# Patient Record
Sex: Female | Born: 1935 | Race: Black or African American | Hispanic: No | Smoking: Never smoker
Health system: Southern US, Community
[De-identification: ages and names within clinical notes are randomized; demographics above are authoritative.]

## PROBLEM LIST (undated history)

## (undated) DIAGNOSIS — I82409 Acute embolism and thrombosis of unspecified deep veins of unspecified lower extremity: Secondary | ICD-10-CM

## (undated) DIAGNOSIS — I639 Cerebral infarction, unspecified: Secondary | ICD-10-CM

## (undated) DIAGNOSIS — E119 Type 2 diabetes mellitus without complications: Secondary | ICD-10-CM

## (undated) DIAGNOSIS — F0390 Unspecified dementia without behavioral disturbance: Secondary | ICD-10-CM

## (undated) DIAGNOSIS — I10 Essential (primary) hypertension: Secondary | ICD-10-CM

## (undated) DIAGNOSIS — I890 Lymphedema, not elsewhere classified: Secondary | ICD-10-CM

## (undated) DIAGNOSIS — I2699 Other pulmonary embolism without acute cor pulmonale: Secondary | ICD-10-CM

## (undated) DIAGNOSIS — E785 Hyperlipidemia, unspecified: Secondary | ICD-10-CM

## (undated) HISTORY — DX: Other pulmonary embolism without acute cor pulmonale: I26.99

## (undated) HISTORY — DX: Essential (primary) hypertension: I10

## (undated) HISTORY — PX: VASCULAR SURGERY: SHX849

## (undated) HISTORY — DX: Acute embolism and thrombosis of unspecified deep veins of unspecified lower extremity: I82.409

## (undated) HISTORY — DX: Lymphedema, not elsewhere classified: I89.0

## (undated) HISTORY — PX: VAGINAL HYSTERECTOMY: SUR661

## (undated) HISTORY — DX: Type 2 diabetes mellitus without complications: E11.9

## (undated) HISTORY — DX: Hyperlipidemia, unspecified: E78.5

---

## 2010-12-28 ENCOUNTER — Ambulatory Visit
Admission: RE | Admit: 2010-12-28 | Discharge: 2010-12-28 | Disposition: A | Discharge status: Home / Self Care | Payer: Medicare Other | Source: Ambulatory Visit | Attending: Internal Medicine | Admitting: Internal Medicine

## 2010-12-28 ENCOUNTER — Other Ambulatory Visit: Payer: Self-pay | Admitting: Internal Medicine

## 2010-12-28 DIAGNOSIS — R609 Edema, unspecified: Secondary | ICD-10-CM

## 2011-02-15 ENCOUNTER — Ambulatory Visit: Payer: Medicare Other | Admitting: Hematology & Oncology

## 2011-02-28 ENCOUNTER — Encounter: Payer: Self-pay | Admitting: *Deleted

## 2011-03-21 ENCOUNTER — Other Ambulatory Visit: Payer: Medicare Other | Admitting: Lab

## 2011-03-21 ENCOUNTER — Encounter: Payer: Self-pay | Admitting: Hematology & Oncology

## 2011-03-21 ENCOUNTER — Ambulatory Visit: Payer: Medicare Other

## 2011-03-21 ENCOUNTER — Ambulatory Visit (HOSPITAL_BASED_OUTPATIENT_CLINIC_OR_DEPARTMENT_OTHER): Payer: Medicare Other | Admitting: Hematology & Oncology

## 2011-03-21 DIAGNOSIS — E785 Hyperlipidemia, unspecified: Secondary | ICD-10-CM

## 2011-03-21 DIAGNOSIS — I890 Lymphedema, not elsewhere classified: Secondary | ICD-10-CM

## 2011-03-21 DIAGNOSIS — Z86711 Personal history of pulmonary embolism: Secondary | ICD-10-CM | POA: Insufficient documentation

## 2011-03-21 DIAGNOSIS — E119 Type 2 diabetes mellitus without complications: Secondary | ICD-10-CM

## 2011-03-21 DIAGNOSIS — I82409 Acute embolism and thrombosis of unspecified deep veins of unspecified lower extremity: Secondary | ICD-10-CM

## 2011-03-21 DIAGNOSIS — I2699 Other pulmonary embolism without acute cor pulmonale: Secondary | ICD-10-CM

## 2011-03-21 DIAGNOSIS — I10 Essential (primary) hypertension: Secondary | ICD-10-CM

## 2011-03-21 DIAGNOSIS — E1142 Type 2 diabetes mellitus with diabetic polyneuropathy: Secondary | ICD-10-CM | POA: Insufficient documentation

## 2011-03-21 DIAGNOSIS — Z86718 Personal history of other venous thrombosis and embolism: Secondary | ICD-10-CM | POA: Insufficient documentation

## 2011-03-21 HISTORY — DX: Type 2 diabetes mellitus without complications: E11.9

## 2011-03-21 HISTORY — DX: Essential (primary) hypertension: I10

## 2011-03-21 HISTORY — DX: Acute embolism and thrombosis of unspecified deep veins of unspecified lower extremity: I82.409

## 2011-03-21 HISTORY — DX: Lymphedema, not elsewhere classified: I89.0

## 2011-03-21 HISTORY — DX: Other pulmonary embolism without acute cor pulmonale: I26.99

## 2011-03-21 HISTORY — DX: Hyperlipidemia, unspecified: E78.5

## 2011-03-21 NOTE — Progress Notes (Signed)
CC:   [name]. Renae Gloss, M.D.  DIAGNOSES: 1. Pulmonary embolism. 2. Deep venous thrombosis of the right leg. 3. Chronic lymphedema of the left leg.  HISTORY OF PRESENT ILLNESS:  [name] is a very nice 75 year old African American female.  She is from [place].  She does come up to [facility] to see family.  She was seen by Dr. Kellie Shropshire.  [name] had been on Coumadin for over 15 years.  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  She says she had a blood clot in her lung probably about 20 years ago.  She thinks that she was on blood thinner.  She is not sure how long she was on blood thinner.  She then developed a blood clot in her right leg. Again, she is not sure when this was.  However, she says she was on postmenopausal estrogen.  She says that her doctor took her off this when she had the blood clot.  She does have a history of a partial hysterectomy.  She has been on anticoagulation since then.  She broke her left ankle. She has developed lymphedema with that ankle.  She saw Dr. Renae Gloss.  She did have Dopplers done.  On 12/28/2010 a Doppler was done bilaterally.  Again, there was no evidence of DVT.  Dr. Renae Gloss felt that a hematologic evaluation was needed to see if she needed to be on Coumadin.  [name] has never had problems with bleeding.  She is very diligent with taking the Coumadin.  She is being followed closely down in [place].  She has had no problems with increased cough or shortness of breath. She does state some problems with pain in her left knee.  She apparently is going to need to have surgery for this.  She is not sure when this will be.  She stays that there is no one else in the family who has history of blood clots as far as she knows.  PAST MEDICAL HISTORY:  Remarkable for: 1. Insulin dependent diabetes. 2. Hypertension. 3. Hyperlipidemia. 4. Dementia. 5. Lymphedema of the left  leg.  ALLERGIES:  None.  MEDICATIONS: 1. Lipitor 20 mg daily. 2. Lasix 40 mg daily. 3. Levemir (insulin) daily. 4. Avapro 200 mg p.o. daily. 5. Mobic 7.5 mg p.o. daily. 6. Metformin 500 mg p.o. b.i.d. 7. Lopressor 100 mg p.o. daily. 8. Exelon 3 mg daily. 9. Coumadin daily.  SOCIAL HISTORY:  Negative for tobacco or alcohol use.  She has no obvious occupational exposures.  FAMILY HISTORY:  Remarkable for diabetes.  There is no history of thrombosis in the family.  No history of cancer in the family.  REVIEW OF SYSTEMS:  As stated in history of present illness.  PHYSICAL EXAMINATION:  General:  This is a well-developed, well- nourished black female in no obvious distress.  Vital signs:  Vital signs 97.1, pulse 67, respiratory rate 16, blood pressure 172/87. Weight is 206.  Head and neck:  Exam shows a normocephalic, atraumatic skull.  There are no ocular or oral lesions.  Nodes:  There are no palpable cervical or supraclavicular lymph nodes.  Lungs:  Are clear bilaterally.  Cardiac:  Regular rate and rhythm with normal S1, S2. There are no murmurs, rubs or bruits.  Abdomen:  Soft with good bowel sounds.  There is no palpable abdominal mass.  There is no palpable hepatosplenomegaly.  Back:  No tenderness over the spine, ribs or hips. Extremities:  Does  show the lymphedema of the left lower leg.  This is about 3+ lymphedema.  It is nonpitting.  She has some slight swelling of the left knee.  There are some slight varicosities of the right lower leg.  She has good pulses in her distal extremities.  Skin: Shows no rashes, ecchymosis or petechiae.  Neurological:  Shows no focal neurological deficits.  LABS:  Not done this visit.  IMPRESSION:  [name] is a 75 year old African American female with a history of a pulmonary embolism.  This was then followed by a deep venous thrombosis of the right leg.  As such, she is committed to lifelong anticoagulation.  I do not see reason to  put her through an extensive hypercoagulable workup.  This would not change management decisions on her.  I feel bad that she needs to be on lifelong anticoagulation.  However, I think that she has clearly shown that her blood is hypercoagulable for whatever reason.  She may need to have knee surgery for the left knee.  I told her that if she is going to have surgery that she will have to be off Coumadin for a week before surgery.  During that week, she would need to be on a shot under the skin.  This would be either Lovenox or Arixtra.  After surgery she would then go back on the Coumadin along with the Lovenox/Arixtra until she was therapeutic.  I do not think we need to get her back to the office.  She came with her friend.  I answered all their questions.  I spent about an hour with them.   ______________________________ Josph Macho, M.D. PRE/MEDQ  D:  03/21/2011  T:  03/21/2011  Job:  [id number]

## 2011-03-21 NOTE — Progress Notes (Signed)
CSN: [id number] This office note has been dictated.

## 2013-05-09 HISTORY — PX: CHOLECYSTECTOMY: SHX55

## 2014-07-08 DIAGNOSIS — F0390 Unspecified dementia without behavioral disturbance: Secondary | ICD-10-CM | POA: Diagnosis not present

## 2014-07-08 DIAGNOSIS — E785 Hyperlipidemia, unspecified: Secondary | ICD-10-CM | POA: Diagnosis not present

## 2014-07-08 DIAGNOSIS — M179 Osteoarthritis of knee, unspecified: Secondary | ICD-10-CM | POA: Diagnosis not present

## 2014-07-08 DIAGNOSIS — K219 Gastro-esophageal reflux disease without esophagitis: Secondary | ICD-10-CM | POA: Diagnosis not present

## 2014-07-08 DIAGNOSIS — M25562 Pain in left knee: Secondary | ICD-10-CM | POA: Diagnosis not present

## 2014-07-08 DIAGNOSIS — M1990 Unspecified osteoarthritis, unspecified site: Secondary | ICD-10-CM | POA: Diagnosis not present

## 2014-07-08 DIAGNOSIS — M25569 Pain in unspecified knee: Secondary | ICD-10-CM | POA: Diagnosis not present

## 2014-07-08 DIAGNOSIS — E119 Type 2 diabetes mellitus without complications: Secondary | ICD-10-CM | POA: Diagnosis not present

## 2014-07-08 DIAGNOSIS — Z23 Encounter for immunization: Secondary | ICD-10-CM | POA: Diagnosis not present

## 2014-08-28 DIAGNOSIS — R911 Solitary pulmonary nodule: Secondary | ICD-10-CM | POA: Diagnosis not present

## 2014-09-01 DIAGNOSIS — E119 Type 2 diabetes mellitus without complications: Secondary | ICD-10-CM | POA: Diagnosis not present

## 2014-09-01 DIAGNOSIS — Z961 Presence of intraocular lens: Secondary | ICD-10-CM | POA: Diagnosis not present

## 2014-09-01 DIAGNOSIS — H04123 Dry eye syndrome of bilateral lacrimal glands: Secondary | ICD-10-CM | POA: Diagnosis not present

## 2014-10-14 DIAGNOSIS — E119 Type 2 diabetes mellitus without complications: Secondary | ICD-10-CM | POA: Diagnosis not present

## 2014-10-14 DIAGNOSIS — F0390 Unspecified dementia without behavioral disturbance: Secondary | ICD-10-CM | POA: Diagnosis not present

## 2014-10-14 DIAGNOSIS — I10 Essential (primary) hypertension: Secondary | ICD-10-CM | POA: Diagnosis not present

## 2014-10-14 DIAGNOSIS — R54 Age-related physical debility: Secondary | ICD-10-CM | POA: Diagnosis not present

## 2014-10-14 DIAGNOSIS — R634 Abnormal weight loss: Secondary | ICD-10-CM | POA: Diagnosis not present

## 2014-10-21 DIAGNOSIS — E119 Type 2 diabetes mellitus without complications: Secondary | ICD-10-CM | POA: Diagnosis not present

## 2014-10-21 DIAGNOSIS — R634 Abnormal weight loss: Secondary | ICD-10-CM | POA: Diagnosis not present

## 2014-10-21 DIAGNOSIS — I10 Essential (primary) hypertension: Secondary | ICD-10-CM | POA: Diagnosis not present

## 2014-10-21 DIAGNOSIS — F0390 Unspecified dementia without behavioral disturbance: Secondary | ICD-10-CM | POA: Diagnosis not present

## 2014-10-23 DIAGNOSIS — F0390 Unspecified dementia without behavioral disturbance: Secondary | ICD-10-CM | POA: Diagnosis not present

## 2014-10-23 DIAGNOSIS — E119 Type 2 diabetes mellitus without complications: Secondary | ICD-10-CM | POA: Diagnosis not present

## 2014-10-23 DIAGNOSIS — R634 Abnormal weight loss: Secondary | ICD-10-CM | POA: Diagnosis not present

## 2014-10-23 DIAGNOSIS — I10 Essential (primary) hypertension: Secondary | ICD-10-CM | POA: Diagnosis not present

## 2014-10-28 DIAGNOSIS — I10 Essential (primary) hypertension: Secondary | ICD-10-CM | POA: Diagnosis not present

## 2014-10-28 DIAGNOSIS — E119 Type 2 diabetes mellitus without complications: Secondary | ICD-10-CM | POA: Diagnosis not present

## 2014-10-28 DIAGNOSIS — R634 Abnormal weight loss: Secondary | ICD-10-CM | POA: Diagnosis not present

## 2014-10-28 DIAGNOSIS — F0390 Unspecified dementia without behavioral disturbance: Secondary | ICD-10-CM | POA: Diagnosis not present

## 2014-11-04 DIAGNOSIS — E119 Type 2 diabetes mellitus without complications: Secondary | ICD-10-CM | POA: Diagnosis not present

## 2014-11-04 DIAGNOSIS — I10 Essential (primary) hypertension: Secondary | ICD-10-CM | POA: Diagnosis not present

## 2014-11-04 DIAGNOSIS — F0390 Unspecified dementia without behavioral disturbance: Secondary | ICD-10-CM | POA: Diagnosis not present

## 2014-11-04 DIAGNOSIS — R634 Abnormal weight loss: Secondary | ICD-10-CM | POA: Diagnosis not present

## 2014-11-11 DIAGNOSIS — I10 Essential (primary) hypertension: Secondary | ICD-10-CM | POA: Diagnosis not present

## 2014-11-11 DIAGNOSIS — E119 Type 2 diabetes mellitus without complications: Secondary | ICD-10-CM | POA: Diagnosis not present

## 2014-11-11 DIAGNOSIS — F0390 Unspecified dementia without behavioral disturbance: Secondary | ICD-10-CM | POA: Diagnosis not present

## 2014-11-11 DIAGNOSIS — R634 Abnormal weight loss: Secondary | ICD-10-CM | POA: Diagnosis not present

## 2014-11-18 DIAGNOSIS — I10 Essential (primary) hypertension: Secondary | ICD-10-CM | POA: Diagnosis not present

## 2014-11-18 DIAGNOSIS — R634 Abnormal weight loss: Secondary | ICD-10-CM | POA: Diagnosis not present

## 2014-11-18 DIAGNOSIS — F0390 Unspecified dementia without behavioral disturbance: Secondary | ICD-10-CM | POA: Diagnosis not present

## 2014-11-18 DIAGNOSIS — E119 Type 2 diabetes mellitus without complications: Secondary | ICD-10-CM | POA: Diagnosis not present

## 2014-11-25 DIAGNOSIS — F0390 Unspecified dementia without behavioral disturbance: Secondary | ICD-10-CM | POA: Diagnosis not present

## 2014-11-25 DIAGNOSIS — E119 Type 2 diabetes mellitus without complications: Secondary | ICD-10-CM | POA: Diagnosis not present

## 2014-11-25 DIAGNOSIS — I10 Essential (primary) hypertension: Secondary | ICD-10-CM | POA: Diagnosis not present

## 2014-11-25 DIAGNOSIS — R634 Abnormal weight loss: Secondary | ICD-10-CM | POA: Diagnosis not present

## 2014-12-11 DIAGNOSIS — E119 Type 2 diabetes mellitus without complications: Secondary | ICD-10-CM | POA: Diagnosis not present

## 2014-12-11 DIAGNOSIS — I10 Essential (primary) hypertension: Secondary | ICD-10-CM | POA: Diagnosis not present

## 2014-12-11 DIAGNOSIS — F0390 Unspecified dementia without behavioral disturbance: Secondary | ICD-10-CM | POA: Diagnosis not present

## 2014-12-11 DIAGNOSIS — R634 Abnormal weight loss: Secondary | ICD-10-CM | POA: Diagnosis not present

## 2014-12-16 DIAGNOSIS — I10 Essential (primary) hypertension: Secondary | ICD-10-CM | POA: Diagnosis not present

## 2014-12-16 DIAGNOSIS — F0390 Unspecified dementia without behavioral disturbance: Secondary | ICD-10-CM | POA: Diagnosis not present

## 2014-12-16 DIAGNOSIS — R634 Abnormal weight loss: Secondary | ICD-10-CM | POA: Diagnosis not present

## 2014-12-16 DIAGNOSIS — E119 Type 2 diabetes mellitus without complications: Secondary | ICD-10-CM | POA: Diagnosis not present

## 2015-04-21 DIAGNOSIS — E1140 Type 2 diabetes mellitus with diabetic neuropathy, unspecified: Secondary | ICD-10-CM | POA: Diagnosis not present

## 2015-04-21 DIAGNOSIS — F0390 Unspecified dementia without behavioral disturbance: Secondary | ICD-10-CM | POA: Diagnosis not present

## 2015-04-21 DIAGNOSIS — E785 Hyperlipidemia, unspecified: Secondary | ICD-10-CM | POA: Diagnosis not present

## 2015-04-21 DIAGNOSIS — I10 Essential (primary) hypertension: Secondary | ICD-10-CM | POA: Diagnosis not present

## 2015-04-21 DIAGNOSIS — I82409 Acute embolism and thrombosis of unspecified deep veins of unspecified lower extremity: Secondary | ICD-10-CM | POA: Diagnosis not present

## 2015-04-21 DIAGNOSIS — E119 Type 2 diabetes mellitus without complications: Secondary | ICD-10-CM | POA: Diagnosis not present

## 2015-04-21 DIAGNOSIS — M1990 Unspecified osteoarthritis, unspecified site: Secondary | ICD-10-CM | POA: Diagnosis not present

## 2015-04-21 DIAGNOSIS — Z23 Encounter for immunization: Secondary | ICD-10-CM | POA: Diagnosis not present

## 2015-05-19 DIAGNOSIS — F0390 Unspecified dementia without behavioral disturbance: Secondary | ICD-10-CM | POA: Diagnosis not present

## 2015-05-28 DIAGNOSIS — E1140 Type 2 diabetes mellitus with diabetic neuropathy, unspecified: Secondary | ICD-10-CM | POA: Diagnosis not present

## 2015-05-28 DIAGNOSIS — F0390 Unspecified dementia without behavioral disturbance: Secondary | ICD-10-CM | POA: Diagnosis not present

## 2015-05-28 DIAGNOSIS — I890 Lymphedema, not elsewhere classified: Secondary | ICD-10-CM | POA: Diagnosis not present

## 2015-05-28 DIAGNOSIS — Z86711 Personal history of pulmonary embolism: Secondary | ICD-10-CM | POA: Diagnosis not present

## 2015-05-28 DIAGNOSIS — R2689 Other abnormalities of gait and mobility: Secondary | ICD-10-CM | POA: Diagnosis not present

## 2015-05-28 DIAGNOSIS — Z7901 Long term (current) use of anticoagulants: Secondary | ICD-10-CM | POA: Diagnosis not present

## 2015-05-28 DIAGNOSIS — M150 Primary generalized (osteo)arthritis: Secondary | ICD-10-CM | POA: Diagnosis not present

## 2015-05-28 DIAGNOSIS — E785 Hyperlipidemia, unspecified: Secondary | ICD-10-CM | POA: Diagnosis not present

## 2015-05-28 DIAGNOSIS — I10 Essential (primary) hypertension: Secondary | ICD-10-CM | POA: Diagnosis not present

## 2015-05-28 DIAGNOSIS — M6281 Muscle weakness (generalized): Secondary | ICD-10-CM | POA: Diagnosis not present

## 2015-06-01 DIAGNOSIS — I890 Lymphedema, not elsewhere classified: Secondary | ICD-10-CM | POA: Diagnosis not present

## 2015-06-01 DIAGNOSIS — E1140 Type 2 diabetes mellitus with diabetic neuropathy, unspecified: Secondary | ICD-10-CM | POA: Diagnosis not present

## 2015-06-01 DIAGNOSIS — I10 Essential (primary) hypertension: Secondary | ICD-10-CM | POA: Diagnosis not present

## 2015-06-01 DIAGNOSIS — F0390 Unspecified dementia without behavioral disturbance: Secondary | ICD-10-CM | POA: Diagnosis not present

## 2015-06-01 DIAGNOSIS — R2689 Other abnormalities of gait and mobility: Secondary | ICD-10-CM | POA: Diagnosis not present

## 2015-06-01 DIAGNOSIS — M6281 Muscle weakness (generalized): Secondary | ICD-10-CM | POA: Diagnosis not present

## 2015-06-03 DIAGNOSIS — I10 Essential (primary) hypertension: Secondary | ICD-10-CM | POA: Diagnosis not present

## 2015-06-03 DIAGNOSIS — E1140 Type 2 diabetes mellitus with diabetic neuropathy, unspecified: Secondary | ICD-10-CM | POA: Diagnosis not present

## 2015-06-03 DIAGNOSIS — R2689 Other abnormalities of gait and mobility: Secondary | ICD-10-CM | POA: Diagnosis not present

## 2015-06-03 DIAGNOSIS — I890 Lymphedema, not elsewhere classified: Secondary | ICD-10-CM | POA: Diagnosis not present

## 2015-06-03 DIAGNOSIS — F0390 Unspecified dementia without behavioral disturbance: Secondary | ICD-10-CM | POA: Diagnosis not present

## 2015-06-03 DIAGNOSIS — M6281 Muscle weakness (generalized): Secondary | ICD-10-CM | POA: Diagnosis not present

## 2015-06-08 DIAGNOSIS — F0390 Unspecified dementia without behavioral disturbance: Secondary | ICD-10-CM | POA: Diagnosis not present

## 2015-06-08 DIAGNOSIS — I890 Lymphedema, not elsewhere classified: Secondary | ICD-10-CM | POA: Diagnosis not present

## 2015-06-08 DIAGNOSIS — R2689 Other abnormalities of gait and mobility: Secondary | ICD-10-CM | POA: Diagnosis not present

## 2015-06-08 DIAGNOSIS — I10 Essential (primary) hypertension: Secondary | ICD-10-CM | POA: Diagnosis not present

## 2015-06-08 DIAGNOSIS — M6281 Muscle weakness (generalized): Secondary | ICD-10-CM | POA: Diagnosis not present

## 2015-06-08 DIAGNOSIS — E1140 Type 2 diabetes mellitus with diabetic neuropathy, unspecified: Secondary | ICD-10-CM | POA: Diagnosis not present

## 2015-06-09 DIAGNOSIS — E1140 Type 2 diabetes mellitus with diabetic neuropathy, unspecified: Secondary | ICD-10-CM | POA: Diagnosis not present

## 2015-06-09 DIAGNOSIS — M6281 Muscle weakness (generalized): Secondary | ICD-10-CM | POA: Diagnosis not present

## 2015-06-09 DIAGNOSIS — I10 Essential (primary) hypertension: Secondary | ICD-10-CM | POA: Diagnosis not present

## 2015-06-09 DIAGNOSIS — R2689 Other abnormalities of gait and mobility: Secondary | ICD-10-CM | POA: Diagnosis not present

## 2015-06-09 DIAGNOSIS — I890 Lymphedema, not elsewhere classified: Secondary | ICD-10-CM | POA: Diagnosis not present

## 2015-06-09 DIAGNOSIS — F0390 Unspecified dementia without behavioral disturbance: Secondary | ICD-10-CM | POA: Diagnosis not present

## 2015-06-10 DIAGNOSIS — R2689 Other abnormalities of gait and mobility: Secondary | ICD-10-CM | POA: Diagnosis not present

## 2015-06-10 DIAGNOSIS — I10 Essential (primary) hypertension: Secondary | ICD-10-CM | POA: Diagnosis not present

## 2015-06-10 DIAGNOSIS — F0390 Unspecified dementia without behavioral disturbance: Secondary | ICD-10-CM | POA: Diagnosis not present

## 2015-06-10 DIAGNOSIS — E1140 Type 2 diabetes mellitus with diabetic neuropathy, unspecified: Secondary | ICD-10-CM | POA: Diagnosis not present

## 2015-06-10 DIAGNOSIS — I890 Lymphedema, not elsewhere classified: Secondary | ICD-10-CM | POA: Diagnosis not present

## 2015-06-10 DIAGNOSIS — M6281 Muscle weakness (generalized): Secondary | ICD-10-CM | POA: Diagnosis not present

## 2015-06-11 DIAGNOSIS — M6281 Muscle weakness (generalized): Secondary | ICD-10-CM | POA: Diagnosis not present

## 2015-06-11 DIAGNOSIS — F0390 Unspecified dementia without behavioral disturbance: Secondary | ICD-10-CM | POA: Diagnosis not present

## 2015-06-11 DIAGNOSIS — I890 Lymphedema, not elsewhere classified: Secondary | ICD-10-CM | POA: Diagnosis not present

## 2015-06-11 DIAGNOSIS — I10 Essential (primary) hypertension: Secondary | ICD-10-CM | POA: Diagnosis not present

## 2015-06-11 DIAGNOSIS — E1140 Type 2 diabetes mellitus with diabetic neuropathy, unspecified: Secondary | ICD-10-CM | POA: Diagnosis not present

## 2015-06-11 DIAGNOSIS — R2689 Other abnormalities of gait and mobility: Secondary | ICD-10-CM | POA: Diagnosis not present

## 2015-06-15 DIAGNOSIS — I890 Lymphedema, not elsewhere classified: Secondary | ICD-10-CM | POA: Diagnosis not present

## 2015-06-15 DIAGNOSIS — R2689 Other abnormalities of gait and mobility: Secondary | ICD-10-CM | POA: Diagnosis not present

## 2015-06-15 DIAGNOSIS — M6281 Muscle weakness (generalized): Secondary | ICD-10-CM | POA: Diagnosis not present

## 2015-06-15 DIAGNOSIS — E1140 Type 2 diabetes mellitus with diabetic neuropathy, unspecified: Secondary | ICD-10-CM | POA: Diagnosis not present

## 2015-06-15 DIAGNOSIS — F0390 Unspecified dementia without behavioral disturbance: Secondary | ICD-10-CM | POA: Diagnosis not present

## 2015-06-15 DIAGNOSIS — I10 Essential (primary) hypertension: Secondary | ICD-10-CM | POA: Diagnosis not present

## 2015-06-16 DIAGNOSIS — M6281 Muscle weakness (generalized): Secondary | ICD-10-CM | POA: Diagnosis not present

## 2015-06-16 DIAGNOSIS — I10 Essential (primary) hypertension: Secondary | ICD-10-CM | POA: Diagnosis not present

## 2015-06-16 DIAGNOSIS — E1140 Type 2 diabetes mellitus with diabetic neuropathy, unspecified: Secondary | ICD-10-CM | POA: Diagnosis not present

## 2015-06-16 DIAGNOSIS — R2689 Other abnormalities of gait and mobility: Secondary | ICD-10-CM | POA: Diagnosis not present

## 2015-06-16 DIAGNOSIS — I890 Lymphedema, not elsewhere classified: Secondary | ICD-10-CM | POA: Diagnosis not present

## 2015-06-16 DIAGNOSIS — F0390 Unspecified dementia without behavioral disturbance: Secondary | ICD-10-CM | POA: Diagnosis not present

## 2015-06-17 DIAGNOSIS — E1140 Type 2 diabetes mellitus with diabetic neuropathy, unspecified: Secondary | ICD-10-CM | POA: Diagnosis not present

## 2015-06-17 DIAGNOSIS — I10 Essential (primary) hypertension: Secondary | ICD-10-CM | POA: Diagnosis not present

## 2015-06-17 DIAGNOSIS — I890 Lymphedema, not elsewhere classified: Secondary | ICD-10-CM | POA: Diagnosis not present

## 2015-06-17 DIAGNOSIS — M6281 Muscle weakness (generalized): Secondary | ICD-10-CM | POA: Diagnosis not present

## 2015-06-17 DIAGNOSIS — F0390 Unspecified dementia without behavioral disturbance: Secondary | ICD-10-CM | POA: Diagnosis not present

## 2015-06-17 DIAGNOSIS — R2689 Other abnormalities of gait and mobility: Secondary | ICD-10-CM | POA: Diagnosis not present

## 2015-06-29 DIAGNOSIS — F0390 Unspecified dementia without behavioral disturbance: Secondary | ICD-10-CM | POA: Diagnosis not present

## 2015-06-29 DIAGNOSIS — E1140 Type 2 diabetes mellitus with diabetic neuropathy, unspecified: Secondary | ICD-10-CM | POA: Diagnosis not present

## 2015-06-29 DIAGNOSIS — I890 Lymphedema, not elsewhere classified: Secondary | ICD-10-CM | POA: Diagnosis not present

## 2015-06-29 DIAGNOSIS — M6281 Muscle weakness (generalized): Secondary | ICD-10-CM | POA: Diagnosis not present

## 2015-06-29 DIAGNOSIS — R2689 Other abnormalities of gait and mobility: Secondary | ICD-10-CM | POA: Diagnosis not present

## 2015-06-29 DIAGNOSIS — I10 Essential (primary) hypertension: Secondary | ICD-10-CM | POA: Diagnosis not present

## 2015-07-01 DIAGNOSIS — M6281 Muscle weakness (generalized): Secondary | ICD-10-CM | POA: Diagnosis not present

## 2015-07-01 DIAGNOSIS — E1140 Type 2 diabetes mellitus with diabetic neuropathy, unspecified: Secondary | ICD-10-CM | POA: Diagnosis not present

## 2015-07-01 DIAGNOSIS — I890 Lymphedema, not elsewhere classified: Secondary | ICD-10-CM | POA: Diagnosis not present

## 2015-07-01 DIAGNOSIS — R2689 Other abnormalities of gait and mobility: Secondary | ICD-10-CM | POA: Diagnosis not present

## 2015-07-01 DIAGNOSIS — I10 Essential (primary) hypertension: Secondary | ICD-10-CM | POA: Diagnosis not present

## 2015-07-01 DIAGNOSIS — F0390 Unspecified dementia without behavioral disturbance: Secondary | ICD-10-CM | POA: Diagnosis not present

## 2015-07-06 DIAGNOSIS — R2689 Other abnormalities of gait and mobility: Secondary | ICD-10-CM | POA: Diagnosis not present

## 2015-07-06 DIAGNOSIS — E1140 Type 2 diabetes mellitus with diabetic neuropathy, unspecified: Secondary | ICD-10-CM | POA: Diagnosis not present

## 2015-07-06 DIAGNOSIS — I890 Lymphedema, not elsewhere classified: Secondary | ICD-10-CM | POA: Diagnosis not present

## 2015-07-06 DIAGNOSIS — M6281 Muscle weakness (generalized): Secondary | ICD-10-CM | POA: Diagnosis not present

## 2015-07-06 DIAGNOSIS — I10 Essential (primary) hypertension: Secondary | ICD-10-CM | POA: Diagnosis not present

## 2015-07-06 DIAGNOSIS — F0390 Unspecified dementia without behavioral disturbance: Secondary | ICD-10-CM | POA: Diagnosis not present

## 2015-07-08 DIAGNOSIS — I890 Lymphedema, not elsewhere classified: Secondary | ICD-10-CM | POA: Diagnosis not present

## 2015-07-08 DIAGNOSIS — I10 Essential (primary) hypertension: Secondary | ICD-10-CM | POA: Diagnosis not present

## 2015-07-08 DIAGNOSIS — M6281 Muscle weakness (generalized): Secondary | ICD-10-CM | POA: Diagnosis not present

## 2015-07-08 DIAGNOSIS — R2689 Other abnormalities of gait and mobility: Secondary | ICD-10-CM | POA: Diagnosis not present

## 2015-07-08 DIAGNOSIS — F0390 Unspecified dementia without behavioral disturbance: Secondary | ICD-10-CM | POA: Diagnosis not present

## 2015-07-08 DIAGNOSIS — E1140 Type 2 diabetes mellitus with diabetic neuropathy, unspecified: Secondary | ICD-10-CM | POA: Diagnosis not present

## 2015-07-13 DIAGNOSIS — F0390 Unspecified dementia without behavioral disturbance: Secondary | ICD-10-CM | POA: Diagnosis not present

## 2015-07-13 DIAGNOSIS — I10 Essential (primary) hypertension: Secondary | ICD-10-CM | POA: Diagnosis not present

## 2015-07-13 DIAGNOSIS — E1140 Type 2 diabetes mellitus with diabetic neuropathy, unspecified: Secondary | ICD-10-CM | POA: Diagnosis not present

## 2015-07-13 DIAGNOSIS — R2689 Other abnormalities of gait and mobility: Secondary | ICD-10-CM | POA: Diagnosis not present

## 2015-07-13 DIAGNOSIS — M6281 Muscle weakness (generalized): Secondary | ICD-10-CM | POA: Diagnosis not present

## 2015-07-13 DIAGNOSIS — I890 Lymphedema, not elsewhere classified: Secondary | ICD-10-CM | POA: Diagnosis not present

## 2015-07-15 DIAGNOSIS — I10 Essential (primary) hypertension: Secondary | ICD-10-CM | POA: Diagnosis not present

## 2015-07-15 DIAGNOSIS — I890 Lymphedema, not elsewhere classified: Secondary | ICD-10-CM | POA: Diagnosis not present

## 2015-07-15 DIAGNOSIS — M6281 Muscle weakness (generalized): Secondary | ICD-10-CM | POA: Diagnosis not present

## 2015-07-15 DIAGNOSIS — R2689 Other abnormalities of gait and mobility: Secondary | ICD-10-CM | POA: Diagnosis not present

## 2015-07-15 DIAGNOSIS — F0390 Unspecified dementia without behavioral disturbance: Secondary | ICD-10-CM | POA: Diagnosis not present

## 2015-07-15 DIAGNOSIS — E1140 Type 2 diabetes mellitus with diabetic neuropathy, unspecified: Secondary | ICD-10-CM | POA: Diagnosis not present

## 2015-07-20 DIAGNOSIS — R2689 Other abnormalities of gait and mobility: Secondary | ICD-10-CM | POA: Diagnosis not present

## 2015-07-20 DIAGNOSIS — I890 Lymphedema, not elsewhere classified: Secondary | ICD-10-CM | POA: Diagnosis not present

## 2015-07-20 DIAGNOSIS — M6281 Muscle weakness (generalized): Secondary | ICD-10-CM | POA: Diagnosis not present

## 2015-07-20 DIAGNOSIS — E1140 Type 2 diabetes mellitus with diabetic neuropathy, unspecified: Secondary | ICD-10-CM | POA: Diagnosis not present

## 2015-07-20 DIAGNOSIS — F0390 Unspecified dementia without behavioral disturbance: Secondary | ICD-10-CM | POA: Diagnosis not present

## 2015-07-20 DIAGNOSIS — I10 Essential (primary) hypertension: Secondary | ICD-10-CM | POA: Diagnosis not present

## 2015-07-22 DIAGNOSIS — R2689 Other abnormalities of gait and mobility: Secondary | ICD-10-CM | POA: Diagnosis not present

## 2015-07-22 DIAGNOSIS — E1140 Type 2 diabetes mellitus with diabetic neuropathy, unspecified: Secondary | ICD-10-CM | POA: Diagnosis not present

## 2015-07-22 DIAGNOSIS — F0390 Unspecified dementia without behavioral disturbance: Secondary | ICD-10-CM | POA: Diagnosis not present

## 2015-07-22 DIAGNOSIS — I890 Lymphedema, not elsewhere classified: Secondary | ICD-10-CM | POA: Diagnosis not present

## 2015-07-22 DIAGNOSIS — I10 Essential (primary) hypertension: Secondary | ICD-10-CM | POA: Diagnosis not present

## 2015-07-22 DIAGNOSIS — M6281 Muscle weakness (generalized): Secondary | ICD-10-CM | POA: Diagnosis not present

## 2015-08-18 DIAGNOSIS — E785 Hyperlipidemia, unspecified: Secondary | ICD-10-CM | POA: Diagnosis not present

## 2015-08-18 DIAGNOSIS — E119 Type 2 diabetes mellitus without complications: Secondary | ICD-10-CM | POA: Diagnosis not present

## 2015-08-18 DIAGNOSIS — R079 Chest pain, unspecified: Secondary | ICD-10-CM | POA: Diagnosis not present

## 2015-08-18 DIAGNOSIS — F0390 Unspecified dementia without behavioral disturbance: Secondary | ICD-10-CM | POA: Diagnosis not present

## 2015-08-18 DIAGNOSIS — I10 Essential (primary) hypertension: Secondary | ICD-10-CM | POA: Diagnosis not present

## 2015-09-07 DIAGNOSIS — H04123 Dry eye syndrome of bilateral lacrimal glands: Secondary | ICD-10-CM | POA: Diagnosis not present

## 2015-09-07 DIAGNOSIS — Z961 Presence of intraocular lens: Secondary | ICD-10-CM | POA: Diagnosis not present

## 2015-09-11 DIAGNOSIS — E119 Type 2 diabetes mellitus without complications: Secondary | ICD-10-CM | POA: Diagnosis not present

## 2015-09-11 DIAGNOSIS — I10 Essential (primary) hypertension: Secondary | ICD-10-CM | POA: Diagnosis not present

## 2015-09-11 DIAGNOSIS — R072 Precordial pain: Secondary | ICD-10-CM | POA: Diagnosis not present

## 2015-09-11 DIAGNOSIS — E782 Mixed hyperlipidemia: Secondary | ICD-10-CM | POA: Diagnosis not present

## 2015-09-11 DIAGNOSIS — Z7901 Long term (current) use of anticoagulants: Secondary | ICD-10-CM | POA: Diagnosis not present

## 2015-09-11 DIAGNOSIS — I2699 Other pulmonary embolism without acute cor pulmonale: Secondary | ICD-10-CM | POA: Diagnosis not present

## 2015-09-11 DIAGNOSIS — R9431 Abnormal electrocardiogram [ECG] [EKG]: Secondary | ICD-10-CM | POA: Diagnosis not present

## 2015-09-23 DIAGNOSIS — R072 Precordial pain: Secondary | ICD-10-CM | POA: Diagnosis not present

## 2015-10-01 DIAGNOSIS — R072 Precordial pain: Secondary | ICD-10-CM | POA: Diagnosis not present

## 2015-10-01 DIAGNOSIS — I2699 Other pulmonary embolism without acute cor pulmonale: Secondary | ICD-10-CM | POA: Diagnosis not present

## 2015-10-28 DIAGNOSIS — R072 Precordial pain: Secondary | ICD-10-CM | POA: Diagnosis not present

## 2015-10-28 DIAGNOSIS — I10 Essential (primary) hypertension: Secondary | ICD-10-CM | POA: Diagnosis not present

## 2015-10-28 DIAGNOSIS — Z7901 Long term (current) use of anticoagulants: Secondary | ICD-10-CM | POA: Diagnosis not present

## 2015-10-28 DIAGNOSIS — I2699 Other pulmonary embolism without acute cor pulmonale: Secondary | ICD-10-CM | POA: Diagnosis not present

## 2015-10-28 DIAGNOSIS — R9430 Abnormal result of cardiovascular function study, unspecified: Secondary | ICD-10-CM | POA: Diagnosis not present

## 2015-10-28 DIAGNOSIS — E782 Mixed hyperlipidemia: Secondary | ICD-10-CM | POA: Diagnosis not present

## 2015-10-28 DIAGNOSIS — E119 Type 2 diabetes mellitus without complications: Secondary | ICD-10-CM | POA: Diagnosis not present

## 2015-11-24 DIAGNOSIS — Z86711 Personal history of pulmonary embolism: Secondary | ICD-10-CM | POA: Diagnosis not present

## 2015-11-24 DIAGNOSIS — F0390 Unspecified dementia without behavioral disturbance: Secondary | ICD-10-CM | POA: Diagnosis not present

## 2015-11-24 DIAGNOSIS — I10 Essential (primary) hypertension: Secondary | ICD-10-CM | POA: Diagnosis not present

## 2015-11-24 DIAGNOSIS — Z79899 Other long term (current) drug therapy: Secondary | ICD-10-CM | POA: Diagnosis not present

## 2015-11-24 DIAGNOSIS — I25110 Atherosclerotic heart disease of native coronary artery with unstable angina pectoris: Secondary | ICD-10-CM | POA: Diagnosis not present

## 2015-11-24 DIAGNOSIS — R079 Chest pain, unspecified: Secondary | ICD-10-CM | POA: Diagnosis not present

## 2015-11-24 DIAGNOSIS — Z86718 Personal history of other venous thrombosis and embolism: Secondary | ICD-10-CM | POA: Diagnosis not present

## 2015-11-24 DIAGNOSIS — Z7984 Long term (current) use of oral hypoglycemic drugs: Secondary | ICD-10-CM | POA: Diagnosis not present

## 2015-11-24 DIAGNOSIS — M1990 Unspecified osteoarthritis, unspecified site: Secondary | ICD-10-CM | POA: Diagnosis not present

## 2015-11-24 DIAGNOSIS — E1140 Type 2 diabetes mellitus with diabetic neuropathy, unspecified: Secondary | ICD-10-CM | POA: Diagnosis not present

## 2015-11-24 DIAGNOSIS — Z7901 Long term (current) use of anticoagulants: Secondary | ICD-10-CM | POA: Diagnosis not present

## 2015-11-24 DIAGNOSIS — K219 Gastro-esophageal reflux disease without esophagitis: Secondary | ICD-10-CM | POA: Diagnosis not present

## 2015-11-24 DIAGNOSIS — Z9049 Acquired absence of other specified parts of digestive tract: Secondary | ICD-10-CM | POA: Diagnosis not present

## 2015-11-24 DIAGNOSIS — E782 Mixed hyperlipidemia: Secondary | ICD-10-CM | POA: Diagnosis not present

## 2015-11-26 DIAGNOSIS — E1140 Type 2 diabetes mellitus with diabetic neuropathy, unspecified: Secondary | ICD-10-CM | POA: Diagnosis not present

## 2015-11-26 DIAGNOSIS — R072 Precordial pain: Secondary | ICD-10-CM | POA: Diagnosis not present

## 2015-11-26 DIAGNOSIS — I10 Essential (primary) hypertension: Secondary | ICD-10-CM | POA: Diagnosis not present

## 2015-11-26 DIAGNOSIS — R9439 Abnormal result of other cardiovascular function study: Secondary | ICD-10-CM | POA: Diagnosis not present

## 2015-11-26 DIAGNOSIS — I25110 Atherosclerotic heart disease of native coronary artery with unstable angina pectoris: Secondary | ICD-10-CM | POA: Diagnosis not present

## 2015-11-26 DIAGNOSIS — E782 Mixed hyperlipidemia: Secondary | ICD-10-CM | POA: Diagnosis not present

## 2015-11-26 DIAGNOSIS — M1990 Unspecified osteoarthritis, unspecified site: Secondary | ICD-10-CM | POA: Diagnosis not present

## 2015-11-26 DIAGNOSIS — F0390 Unspecified dementia without behavioral disturbance: Secondary | ICD-10-CM | POA: Diagnosis not present

## 2015-12-02 DIAGNOSIS — E782 Mixed hyperlipidemia: Secondary | ICD-10-CM | POA: Diagnosis not present

## 2015-12-02 DIAGNOSIS — I10 Essential (primary) hypertension: Secondary | ICD-10-CM | POA: Diagnosis not present

## 2015-12-02 DIAGNOSIS — E119 Type 2 diabetes mellitus without complications: Secondary | ICD-10-CM | POA: Diagnosis not present

## 2016-02-01 DIAGNOSIS — K219 Gastro-esophageal reflux disease without esophagitis: Secondary | ICD-10-CM | POA: Diagnosis not present

## 2016-02-01 DIAGNOSIS — F0390 Unspecified dementia without behavioral disturbance: Secondary | ICD-10-CM | POA: Diagnosis not present

## 2016-02-01 DIAGNOSIS — M1990 Unspecified osteoarthritis, unspecified site: Secondary | ICD-10-CM | POA: Diagnosis not present

## 2016-02-01 DIAGNOSIS — E119 Type 2 diabetes mellitus without complications: Secondary | ICD-10-CM | POA: Diagnosis not present

## 2016-02-01 DIAGNOSIS — I890 Lymphedema, not elsewhere classified: Secondary | ICD-10-CM | POA: Diagnosis not present

## 2016-03-08 ENCOUNTER — Encounter (HOSPITAL_COMMUNITY): Payer: Self-pay

## 2016-03-08 ENCOUNTER — Emergency Department (HOSPITAL_COMMUNITY): Payer: Medicare Other

## 2016-03-08 ENCOUNTER — Other Ambulatory Visit: Payer: Self-pay

## 2016-03-08 ENCOUNTER — Emergency Department (HOSPITAL_BASED_OUTPATIENT_CLINIC_OR_DEPARTMENT_OTHER)
Admit: 2016-03-08 | Discharge: 2016-03-08 | Disposition: A | Discharge status: Home / Self Care | Payer: Medicare Other | Attending: Emergency Medicine | Admitting: Emergency Medicine

## 2016-03-08 ENCOUNTER — Emergency Department (HOSPITAL_COMMUNITY)
Admission: EM | Admit: 2016-03-08 | Discharge: 2016-03-08 | Disposition: A | Discharge status: Home / Self Care | Payer: Medicare Other | Attending: Emergency Medicine | Admitting: Emergency Medicine

## 2016-03-08 DIAGNOSIS — Z7984 Long term (current) use of oral hypoglycemic drugs: Secondary | ICD-10-CM | POA: Insufficient documentation

## 2016-03-08 DIAGNOSIS — Z7901 Long term (current) use of anticoagulants: Secondary | ICD-10-CM | POA: Insufficient documentation

## 2016-03-08 DIAGNOSIS — S73005A Unspecified dislocation of left hip, initial encounter: Secondary | ICD-10-CM | POA: Diagnosis not present

## 2016-03-08 DIAGNOSIS — S70922A Unspecified superficial injury of left thigh, initial encounter: Secondary | ICD-10-CM | POA: Diagnosis not present

## 2016-03-08 DIAGNOSIS — Z79899 Other long term (current) drug therapy: Secondary | ICD-10-CM | POA: Diagnosis not present

## 2016-03-08 DIAGNOSIS — S3993XA Unspecified injury of pelvis, initial encounter: Secondary | ICD-10-CM | POA: Diagnosis not present

## 2016-03-08 DIAGNOSIS — E119 Type 2 diabetes mellitus without complications: Secondary | ICD-10-CM | POA: Diagnosis not present

## 2016-03-08 DIAGNOSIS — M25559 Pain in unspecified hip: Secondary | ICD-10-CM | POA: Diagnosis not present

## 2016-03-08 DIAGNOSIS — T148XXA Other injury of unspecified body region, initial encounter: Secondary | ICD-10-CM | POA: Diagnosis not present

## 2016-03-08 DIAGNOSIS — R296 Repeated falls: Secondary | ICD-10-CM | POA: Diagnosis not present

## 2016-03-08 DIAGNOSIS — S299XXA Unspecified injury of thorax, initial encounter: Secondary | ICD-10-CM | POA: Diagnosis not present

## 2016-03-08 DIAGNOSIS — W19XXXA Unspecified fall, initial encounter: Secondary | ICD-10-CM

## 2016-03-08 DIAGNOSIS — I890 Lymphedema, not elsewhere classified: Secondary | ICD-10-CM | POA: Insufficient documentation

## 2016-03-08 DIAGNOSIS — M25552 Pain in left hip: Secondary | ICD-10-CM | POA: Diagnosis present

## 2016-03-08 DIAGNOSIS — I10 Essential (primary) hypertension: Secondary | ICD-10-CM | POA: Insufficient documentation

## 2016-03-08 DIAGNOSIS — M7989 Other specified soft tissue disorders: Secondary | ICD-10-CM | POA: Diagnosis not present

## 2016-03-08 DIAGNOSIS — M79652 Pain in left thigh: Secondary | ICD-10-CM | POA: Diagnosis not present

## 2016-03-08 HISTORY — DX: Unspecified dementia, unspecified severity, without behavioral disturbance, psychotic disturbance, mood disturbance, and anxiety: F03.90

## 2016-03-08 LAB — CBC WITH DIFFERENTIAL/PLATELET
BASOS ABS: 0 10*3/uL (ref 0.0–0.1)
Basophils Relative: 0 %
EOS ABS: 0.1 10*3/uL (ref 0.0–0.7)
EOS PCT: 1 %
HCT: 33.6 % — ABNORMAL LOW (ref 36.0–46.0)
HEMOGLOBIN: 10.6 g/dL — AB (ref 12.0–15.0)
LYMPHS PCT: 24 %
Lymphs Abs: 1.5 10*3/uL (ref 0.7–4.0)
MCH: 28.8 pg (ref 26.0–34.0)
MCHC: 31.5 g/dL (ref 30.0–36.0)
MCV: 91.3 fL (ref 78.0–100.0)
Monocytes Absolute: 0.8 10*3/uL (ref 0.1–1.0)
Monocytes Relative: 13 %
NEUTROS PCT: 62 %
Neutro Abs: 3.8 10*3/uL (ref 1.7–7.7)
PLATELETS: 253 10*3/uL (ref 150–400)
RBC: 3.68 MIL/uL — AB (ref 3.87–5.11)
RDW: 14 % (ref 11.5–15.5)
WBC: 6.1 10*3/uL (ref 4.0–10.5)

## 2016-03-08 LAB — COMPREHENSIVE METABOLIC PANEL
ALK PHOS: 49 U/L (ref 38–126)
ALT: 17 U/L (ref 14–54)
AST: 30 U/L (ref 15–41)
Albumin: 2.5 g/dL — ABNORMAL LOW (ref 3.5–5.0)
Anion gap: 10 (ref 5–15)
BUN: 11 mg/dL (ref 6–20)
CHLORIDE: 104 mmol/L (ref 101–111)
CO2: 26 mmol/L (ref 22–32)
CREATININE: 0.52 mg/dL (ref 0.44–1.00)
Calcium: 8.4 mg/dL — ABNORMAL LOW (ref 8.9–10.3)
GFR calc Af Amer: >60 mL/min (ref >60)
GFR calc non Af Amer: >60 mL/min (ref >60)
GLUCOSE: 123 mg/dL — AB (ref 65–99)
Potassium: 5 mmol/L (ref 3.5–5.1)
SODIUM: 140 mmol/L (ref 135–145)
Total Bilirubin: 1.4 mg/dL — ABNORMAL HIGH (ref 0.3–1.2)
Total Protein: 5.5 g/dL — ABNORMAL LOW (ref 6.5–8.1)

## 2016-03-08 LAB — CK: Total CK: 592 U/L — ABNORMAL HIGH (ref 38–234)

## 2016-03-08 LAB — PROTIME-INR
INR: 1.35
Prothrombin Time: 16.7 seconds — ABNORMAL HIGH (ref 11.4–15.2)

## 2016-03-08 MED ORDER — HYDROCODONE-ACETAMINOPHEN 5-325 MG PO TABS
2.0000 | ORAL_TABLET | Freq: Once | ORAL | Status: AC
  Administered 2016-03-08: 2 via ORAL
  Filled 2016-03-08: qty 2

## 2016-03-08 NOTE — ED Notes (Signed)
MD at the bedside  

## 2016-03-08 NOTE — Care Management Note (Signed)
Case Management Note  Patient Details  Name: [name] MRN: [id number] Date of Birth: 08/09/1935  Subjective/Objective:                 Patient presented to [facility] ED with c/o of generalized weakness with leg swelling. Patient has been  having difficulty with ambulation for several months   Action/Plan: CM received consult concerning recommendations for [facility] services. CM met with patient (daughter in law) Cherylanne Ardelean [phone] to discuss recommendations for [facility] services RN,PT,OT, HHA  Patient is agreeable with care transitional plan. Offered choice, selected [facility], Patient denies equipment needs, has walker/bedside commode. Verified address where services will be rendered. Referral faxed to [facility] 336 [phone] received fax confirmation. CM explained that patient should receive a call from [facility] nurse 24-48 hours post discharge.Patient and family verbalized understanding teach back done. No further ED CM needs identified   Expected Discharge Date:    03/08/2016              Expected Discharge Plan:  [place]  In-House Referral:     Discharge planning Services  CM Consult  Post Acute Care Choice:  Home Health Choice offered to:  Patient, Adult Children  DME Arranged:    DME Agency:     HH Arranged:  RN, PT, OT, Nurse's Aide [place] Agency:  [facility]  Status of Service:  Completed, signed off  If discussed at [place] of Stay Meetings, dates discussed:    Additional CommentsLaurena Slimmer, RN 03/08/2016, 9:57 PM

## 2016-03-08 NOTE — ED Provider Notes (Signed)
Level V caveat dementia. History obtained from patient's daughter-in-law who accompanies her. Patient has had swollen left lower extremity for unknown period of time. Patient's denies pain anywhere states "I feel wonderful". Her daughter-in-law reports that she may have fallen a week ago. On exam she is in no distress lungs clear auscultation heart regular rate and rhythm abdomen nondistended nontender. Left lower extremity edematous from the foot to the thigh. No point tenderness. DP pulse 2+ bilaterally. Good capillary refill. All other extremities without redness swelling or tenderness neurovascularly intact. DVT study negative. 8:15 PM patient resting comfortably. No distress. No evidence of DVT or occult fracture Results for orders placed or performed during the hospital encounter of 03/08/16  CBC with Differential/Platelet  Result Value Ref Range   WBC 6.1 4.0 - 10.5 K/uL   RBC 3.68 (L) 3.87 - 5.11 MIL/uL   Hemoglobin 10.6 (L) 12.0 - 15.0 g/dL   HCT 33.6 (L) 36.0 - 46.0 %   MCV 91.3 78.0 - 100.0 fL   MCH 28.8 26.0 - 34.0 pg   MCHC 31.5 30.0 - 36.0 g/dL   RDW 14.0 11.5 - 15.5 %   Platelets 253 150 - 400 K/uL   Neutrophils Relative % 62 %   Neutro Abs 3.8 1.7 - 7.7 K/uL   Lymphocytes Relative 24 %   Lymphs Abs 1.5 0.7 - 4.0 K/uL   Monocytes Relative 13 %   Monocytes Absolute 0.8 0.1 - 1.0 K/uL   Eosinophils Relative 1 %   Eosinophils Absolute 0.1 0.0 - 0.7 K/uL   Basophils Relative 0 %   Basophils Absolute 0.0 0.0 - 0.1 K/uL  Comprehensive metabolic panel  Result Value Ref Range   Sodium 140 135 - 145 mmol/L   Potassium 5.0 3.5 - 5.1 mmol/L   Chloride 104 101 - 111 mmol/L   CO2 26 22 - 32 mmol/L   Glucose, Bld 123 (H) 65 - 99 mg/dL   BUN 11 6 - 20 mg/dL   Creatinine, Ser 0.52 0.44 - 1.00 mg/dL   Calcium 8.4 (L) 8.9 - 10.3 mg/dL   Total Protein 5.5 (L) 6.5 - 8.1 g/dL   Albumin 2.5 (L) 3.5 - 5.0 g/dL   AST 30 15 - 41 U/L   ALT 17 14 - 54 U/L   Alkaline Phosphatase 49 38 - 126  U/L   Total Bilirubin 1.4 (H) 0.3 - 1.2 mg/dL   GFR calc non Af Amer >60 >60 mL/min   GFR calc Af Amer >60 >60 mL/min   Anion gap 10 5 - 15  CK  Result Value Ref Range   Total CK 592 (H) 38 - 234 U/L  Protime-INR  Result Value Ref Range   Prothrombin Time 16.7 (H) 11.4 - 15.2 seconds   INR 1.35    Dg Chest 1 View  Result Date: 03/08/2016 CLINICAL DATA:  Fall. EXAM: CHEST 1 VIEW COMPARISON:  None. FINDINGS: AP supine view chest demonstrates mildly low lung volumes. Mild interstitial opacities could reflect chronic change. No focal consolidation or effusion. Mild cardiomegaly. Atherosclerosis of the aortic arch. No pneumothorax. Skin folds over the left lateral chest. Surgical clips in the right upper quadrant. IMPRESSION: 1. Mild cardiomegaly without overt failure. 2. No radiographic evidence for acute cardiothoracic abnormality. Electronically Signed   By: Donavan Foil M.D.   On: 03/08/2016 15:51   Dg Pelvis 1-2 Views  Result Date: 03/08/2016 CLINICAL DATA:  Fall with pain EXAM: PELVIS - 1-2 VIEW COMPARISON:  None. FINDINGS: Single view  pelvis demonstrates a surgical clip in the right pelvis. Pubic symphysis appears intact. No acute displaced fracture or dislocation. Mild degenerative changes of the right greater than left hips. Vascular calcifications. Calcified pelvic phleboliths. IMPRESSION: 1. Mild bilateral degenerative changes, no fracture or dislocation. 2. Vascular calcification Electronically Signed   By: Donavan Foil M.D.   On: 03/08/2016 15:54   Ct Hip Left Wo Contrast  Result Date: 03/08/2016 CLINICAL DATA:  Recent falls with left hip pain, initial encounter EXAM: CT OF THE LEFT HIP WITHOUT CONTRAST TECHNIQUE: Multidetector CT imaging of the left hip was performed according to the standard protocol. Multiplanar CT image reconstructions were also generated. COMPARISON:  None. FINDINGS: Bones/Joint/Cartilage Bony structures show degenerative change of the left sacroiliac joint.  No definitive fracture or dislocation is noted. Mild degenerative changes of the left hip are seen. Ligaments Suboptimally assessed by CT. Muscles and Tendons Within normal limits. Soft tissues Some generalized edema of the upper thigh is noted. Vascular calcifications are seen. No joint effusion or pelvic abnormality is noted. IMPRESSION: No acute fracture identified. Diffuse generalized edema the upper left thigh. The possibility of deep venous thrombosis would deserve consideration. Electronically Signed   By: Inez Catalina M.D.   On: 03/08/2016 18:58   Dg Femur Min 2 Views Left  Result Date: 03/08/2016 CLINICAL DATA:  Fall with pain EXAM: LEFT FEMUR 2 VIEWS COMPARISON:  None. FINDINGS: Bones appear osteopenic. No definite acute fracture or dislocation. Vascular calcifications are present. Advanced arthritic changes of the left knee. Pubic symphysis appears intact. IMPRESSION: Osteopenia.  No gross fracture dislocation. Electronically Signed   By: Donavan Foil M.D.   On: 03/08/2016 15:53   Plan elevate leg. INR recheck 7-10 days. Blood pressure recheck within the next 3 weeks. Diagnoses #1 lymphedema left leg #2 subtherapeutic INR #3 elevated blood pressure    Orlie Dakin, MD 03/08/16 2020

## 2016-03-08 NOTE — Progress Notes (Signed)
*  PRELIMINARY RESULTS* Vascular Ultrasound Left lower extremity venous duplex has been completed.  Preliminary findings: No evidence of DVT or baker's cyst.  Landry Mellow, RDMS, RVT  03/08/2016, 5:32 PM

## 2016-03-08 NOTE — ED Notes (Signed)
Vascular at bedside

## 2016-03-08 NOTE — ED Notes (Signed)
Patient transported to venous study.  

## 2016-03-08 NOTE — ED Notes (Signed)
Vascular returned page, pt is next on the list to go have her study completed.

## 2016-03-08 NOTE — ED Provider Notes (Signed)
[facility] DEPT Provider Note   CSN: [id number] Arrival date & time: 03/08/16  1438     History   Chief Complaint Chief Complaint  Patient presents with  . Hip Pain    left hip  . Leg Swelling    left leg    HPI [name] is a 80 y.o. female.  HPI  Pt is an elderly 80 y/o female - she has dementia - she is currently staying with relatives her [place] town but is from [place] and had family with her there as well.  There is a question as to having some falls in the last month though none witnessed in last 4 weeks (may have fallen out of bed last week).  She has been walking since coming here in last couple of days.  She c/o pain in the L hip which is persistent, severe, worse with moving the leg / hip and assocaited with swelling of the LLE from the foot through the knee - she has chronic swelling of her foot but not usually higher than that.  She denies cough, sob, cp, ha, fever or n/v/d.    Past Medical History:  Diagnosis Date  . Dementia   . Diabetes mellitus type II 03/21/2011  . DVT (deep venous thrombosis) ([place]) 03/21/2011  . Gallstones   . Hyperlipidemia 03/21/2011  . Hypertension 03/21/2011  . Lymphedema of leg 03/21/2011  . Pulmonary embolism ([place]) 03/21/2011    Patient Active Problem List   Diagnosis Date Noted  . Pulmonary embolism ([place]) 03/21/2011  . DVT (deep venous thrombosis) ([place]) 03/21/2011  . Diabetes mellitus type II 03/21/2011  . Lymphedema of leg 03/21/2011  . Hyperlipidemia 03/21/2011  . Hypertension 03/21/2011    Past Surgical History:  Procedure Laterality Date  . VASCULAR SURGERY      OB History    No data available       Home Medications    Prior to Admission medications   Medication Sig Start Date End Date Taking? Authorizing Provider  atorvastatin (LIPITOR) 20 MG tablet Take 20 mg by mouth daily at 6 PM.    Yes Historical Provider, MD  cloNIDine (CATAPRES) 0.1 MG tablet Take 0.1 mg by mouth 2 (two) times daily.   Yes  Historical Provider, MD  Dexlansoprazole (DEXILANT) 30 MG capsule Take 30 mg by mouth daily.   Yes Historical Provider, MD  metFORMIN (GLUCOPHAGE) 500 MG tablet Take 1,000 mg by mouth 2 (two) times daily.    Yes Historical Provider, MD  Rivastigmine (EXELON) 13.3 MG/24HR PT24 Place 1 patch onto the skin daily.   Yes Historical Provider, MD  warfarin (COUMADIN) 2 MG tablet Take 2 mg by mouth daily at 6 PM.   Yes Historical Provider, MD    Family History No family history on file.  Social History Social History  Substance Use Topics  . Smoking status: Never Smoker  . Smokeless tobacco: Never Used  . Alcohol use No     Allergies   Review of patient's allergies indicates no known allergies.   Review of Systems Review of Systems  Unable to perform ROS: Dementia     Physical Exam Updated Vital Signs BP 147/94   Pulse 82   Temp 98.2 F (36.8 C) (Oral)   Resp 17   SpO2 99%   Physical Exam  Constitutional: She appears well-developed and well-nourished. No distress.  HENT:  Head: Normocephalic and atraumatic.  Mouth/Throat: Oropharynx is clear and moist. No oropharyngeal exudate.  No bruising / swelling  or signs of trauma to the head  Eyes: Conjunctivae and EOM are normal. Pupils are equal, round, and reactive to light. Right eye exhibits no discharge. Left eye exhibits no discharge. No scleral icterus.  Neck: Normal range of motion. Neck supple. No JVD present. No thyromegaly present.  Cardiovascular: Normal rate, regular rhythm, normal heart sounds and intact distal pulses.  Exam reveals no gallop and no friction rub.   No murmur heard. Pulmonary/Chest: Effort normal and breath sounds normal. No respiratory distress. She has no wheezes. She has no rales.  Abdominal: Soft. Bowel sounds are normal. She exhibits no distension and no mass. There is no tenderness.  Musculoskeletal: She exhibits edema ( LLE edema from foot through knee, muscle wasting in the RLE), tenderness and  deformity ( pain in the L hip to palpation).  Can't straighten legs out - has pain with ROM of the L hip - can put RLE through full ROM> keeps the L leg flexed at the hip and has pain with any ROM of the hip / leg.  Lymphadenopathy:    She has no cervical adenopathy.  Neurological: She is alert. Coordination normal.  Follows commands, well appearing,   Skin: Skin is warm and dry. No rash noted. No erythema.  Psychiatric: She has a normal mood and affect. Her behavior is normal.  Nursing note and vitals reviewed.    ED Treatments / Results  Labs (all labs ordered are listed, but only abnormal results are displayed) Labs Reviewed  CBC WITH DIFFERENTIAL/PLATELET - Abnormal; Notable for the following:       Result Value   RBC 3.68 (*)    Hemoglobin 10.6 (*)    HCT 33.6 (*)    All other components within normal limits  COMPREHENSIVE METABOLIC PANEL - Abnormal; Notable for the following:    Glucose, Bld 123 (*)    Calcium 8.4 (*)    Total Protein 5.5 (*)    Albumin 2.5 (*)    Total Bilirubin 1.4 (*)    All other components within normal limits  CK - Abnormal; Notable for the following:    Total CK 592 (*)    All other components within normal limits  PROTIME-INR - Abnormal; Notable for the following:    Prothrombin Time 16.7 (*)    All other components within normal limits    EKG  EKG Interpretation  Date/Time:  Tuesday March 08 2016 14:29:29 EDT Ventricular Rate:  102 PR Interval:  176 QRS Duration: 62 QT Interval:  342 QTC Calculation: 445 R Axis:   79 Text Interpretation:  Sinus tachycardia Septal infarct , age undetermined Abnormal ECG No STEMI Confirmed by LONG MD, JOSHUA [phone]) on 03/09/2016 4:02:47 PM       Radiology Dg Chest 1 View  Result Date: 03/08/2016 CLINICAL DATA:  Fall. EXAM: CHEST 1 VIEW COMPARISON:  None. FINDINGS: AP supine view chest demonstrates mildly low lung volumes. Mild interstitial opacities could reflect chronic change. No focal  consolidation or effusion. Mild cardiomegaly. Atherosclerosis of the aortic arch. No pneumothorax. Skin folds over the left lateral chest. Surgical clips in the right upper quadrant. IMPRESSION: 1. Mild cardiomegaly without overt failure. 2. No radiographic evidence for acute cardiothoracic abnormality. Electronically Signed   By: Donavan Foil M.D.   On: 03/08/2016 15:51   Dg Pelvis 1-2 Views  Result Date: 03/08/2016 CLINICAL DATA:  Fall with pain EXAM: PELVIS - 1-2 VIEW COMPARISON:  None. FINDINGS: Single view pelvis demonstrates a surgical clip in the right  pelvis. Pubic symphysis appears intact. No acute displaced fracture or dislocation. Mild degenerative changes of the right greater than left hips. Vascular calcifications. Calcified pelvic phleboliths. IMPRESSION: 1. Mild bilateral degenerative changes, no fracture or dislocation. 2. Vascular calcification Electronically Signed   By: Donavan Foil M.D.   On: 03/08/2016 15:54   Ct Hip Left Wo Contrast  Result Date: 03/08/2016 CLINICAL DATA:  Recent falls with left hip pain, initial encounter EXAM: CT OF THE LEFT HIP WITHOUT CONTRAST TECHNIQUE: Multidetector CT imaging of the left hip was performed according to the standard protocol. Multiplanar CT image reconstructions were also generated. COMPARISON:  None. FINDINGS: Bones/Joint/Cartilage Bony structures show degenerative change of the left sacroiliac joint. No definitive fracture or dislocation is noted. Mild degenerative changes of the left hip are seen. Ligaments Suboptimally assessed by CT. Muscles and Tendons Within normal limits. Soft tissues Some generalized edema of the upper thigh is noted. Vascular calcifications are seen. No joint effusion or pelvic abnormality is noted. IMPRESSION: No acute fracture identified. Diffuse generalized edema the upper left thigh. The possibility of deep venous thrombosis would deserve consideration. Electronically Signed   By: Inez Catalina M.D.   On:  03/08/2016 18:58   Dg Femur Min 2 Views Left  Result Date: 03/08/2016 CLINICAL DATA:  Fall with pain EXAM: LEFT FEMUR 2 VIEWS COMPARISON:  None. FINDINGS: Bones appear osteopenic. No definite acute fracture or dislocation. Vascular calcifications are present. Advanced arthritic changes of the left knee. Pubic symphysis appears intact. IMPRESSION: Osteopenia.  No gross fracture dislocation. Electronically Signed   By: Donavan Foil M.D.   On: 03/08/2016 15:53    Procedures Procedures (including critical care time)  Medications Ordered in ED Medications  HYDROcodone-acetaminophen (NORCO/VICODIN) 5-325 MG per tablet 2 tablet (2 tablets Oral Given 03/08/16 1514)     Initial Impression / Assessment and Plan / ED Course  I have reviewed the triage vital signs and the nursing notes.  Pertinent labs & imaging results that were available during my care of the patient were reviewed by me and considered in my medical decision making (see chart for details).  Clinical Course    D/w DR. Jacubowitz at change of shift - awaiting [place] of leg and INR - imaging neg for frx Family agreeable to going home if studies neg.  Final Clinical Impressions(s) / ED Diagnoses   Final diagnoses:  Lymphedema    New Prescriptions Discharge Medication List as of 03/08/2016  8:22 PM       Noemi Chapel, MD 03/10/16 [phone]

## 2016-03-08 NOTE — Discharge Instructions (Signed)
Ms. Dwan can take Tylenol 2 regular strength tablets (650 mg) every 4 hours as needed for pain. She should get her Coumadin level(INR) rechecked within the next 7-10 days. Today's was low at 1.35. Her blood pressure should be rechecked within the next 3 weeks. Today's was mildly elevated at 156/102. Elevate her left leg above her heart as much as possible.

## 2016-03-08 NOTE — ED Notes (Signed)
Dr Miller at beside

## 2016-03-08 NOTE — ED Triage Notes (Signed)
The pts daughter in law states that the pt normally has some swelling in the left foot, but the swelling today is abnormal for the pt. The swelling starts at the left hip and descends all the way to the pts toes. The pt is able to wiggle her toes, the cap refill is less than 3 seconds, and the pt states that sensation is present.

## 2016-03-08 NOTE — ED Notes (Signed)
Vascular tech paged 

## 2016-03-08 NOTE — ED Notes (Signed)
Patient transported to X-ray 

## 2016-03-08 NOTE — ED Triage Notes (Signed)
Per pts daughter in law, the pt fell sometime last week.

## 2016-03-08 NOTE — ED Triage Notes (Signed)
Per EMS: Pt has hx of dementiaFamily brought pt home from assistive home . "not sure when, but pt had fall recently, past week"  Pt seems in no distress, calm and cooperative, oriented to self only. LT leg looks very swollen with hip deformity.

## 2016-03-11 DIAGNOSIS — Z9181 History of falling: Secondary | ICD-10-CM | POA: Diagnosis not present

## 2016-03-11 DIAGNOSIS — E119 Type 2 diabetes mellitus without complications: Secondary | ICD-10-CM | POA: Diagnosis not present

## 2016-03-11 DIAGNOSIS — I10 Essential (primary) hypertension: Secondary | ICD-10-CM | POA: Diagnosis not present

## 2016-03-11 DIAGNOSIS — L89620 Pressure ulcer of left heel, unstageable: Secondary | ICD-10-CM | POA: Diagnosis not present

## 2016-03-11 DIAGNOSIS — L89152 Pressure ulcer of sacral region, stage 2: Secondary | ICD-10-CM | POA: Diagnosis not present

## 2016-03-11 DIAGNOSIS — I890 Lymphedema, not elsewhere classified: Secondary | ICD-10-CM | POA: Diagnosis not present

## 2016-03-11 DIAGNOSIS — Z7984 Long term (current) use of oral hypoglycemic drugs: Secondary | ICD-10-CM | POA: Diagnosis not present

## 2016-03-11 DIAGNOSIS — F0390 Unspecified dementia without behavioral disturbance: Secondary | ICD-10-CM | POA: Diagnosis not present

## 2016-03-13 DIAGNOSIS — L89152 Pressure ulcer of sacral region, stage 2: Secondary | ICD-10-CM | POA: Diagnosis not present

## 2016-03-13 DIAGNOSIS — I10 Essential (primary) hypertension: Secondary | ICD-10-CM | POA: Diagnosis not present

## 2016-03-13 DIAGNOSIS — I890 Lymphedema, not elsewhere classified: Secondary | ICD-10-CM | POA: Diagnosis not present

## 2016-03-13 DIAGNOSIS — F0390 Unspecified dementia without behavioral disturbance: Secondary | ICD-10-CM | POA: Diagnosis not present

## 2016-03-13 DIAGNOSIS — E119 Type 2 diabetes mellitus without complications: Secondary | ICD-10-CM | POA: Diagnosis not present

## 2016-03-13 DIAGNOSIS — L89620 Pressure ulcer of left heel, unstageable: Secondary | ICD-10-CM | POA: Diagnosis not present

## 2016-03-14 DIAGNOSIS — F0390 Unspecified dementia without behavioral disturbance: Secondary | ICD-10-CM | POA: Diagnosis not present

## 2016-03-14 DIAGNOSIS — L89152 Pressure ulcer of sacral region, stage 2: Secondary | ICD-10-CM | POA: Diagnosis not present

## 2016-03-14 DIAGNOSIS — L89620 Pressure ulcer of left heel, unstageable: Secondary | ICD-10-CM | POA: Diagnosis not present

## 2016-03-14 DIAGNOSIS — I890 Lymphedema, not elsewhere classified: Secondary | ICD-10-CM | POA: Diagnosis not present

## 2016-03-14 DIAGNOSIS — E119 Type 2 diabetes mellitus without complications: Secondary | ICD-10-CM | POA: Diagnosis not present

## 2016-03-14 DIAGNOSIS — I10 Essential (primary) hypertension: Secondary | ICD-10-CM | POA: Diagnosis not present

## 2016-03-15 DIAGNOSIS — M1712 Unilateral primary osteoarthritis, left knee: Secondary | ICD-10-CM | POA: Diagnosis not present

## 2016-03-15 DIAGNOSIS — M25462 Effusion, left knee: Secondary | ICD-10-CM | POA: Diagnosis not present

## 2016-03-15 DIAGNOSIS — M25562 Pain in left knee: Secondary | ICD-10-CM | POA: Diagnosis not present

## 2016-03-16 DIAGNOSIS — I10 Essential (primary) hypertension: Secondary | ICD-10-CM | POA: Diagnosis not present

## 2016-03-16 DIAGNOSIS — E119 Type 2 diabetes mellitus without complications: Secondary | ICD-10-CM | POA: Diagnosis not present

## 2016-03-16 DIAGNOSIS — F0390 Unspecified dementia without behavioral disturbance: Secondary | ICD-10-CM | POA: Diagnosis not present

## 2016-03-16 DIAGNOSIS — L89620 Pressure ulcer of left heel, unstageable: Secondary | ICD-10-CM | POA: Diagnosis not present

## 2016-03-16 DIAGNOSIS — I890 Lymphedema, not elsewhere classified: Secondary | ICD-10-CM | POA: Diagnosis not present

## 2016-03-16 DIAGNOSIS — L89152 Pressure ulcer of sacral region, stage 2: Secondary | ICD-10-CM | POA: Diagnosis not present

## 2016-03-17 DIAGNOSIS — L89152 Pressure ulcer of sacral region, stage 2: Secondary | ICD-10-CM | POA: Diagnosis not present

## 2016-03-17 DIAGNOSIS — I10 Essential (primary) hypertension: Secondary | ICD-10-CM | POA: Diagnosis not present

## 2016-03-17 DIAGNOSIS — L89620 Pressure ulcer of left heel, unstageable: Secondary | ICD-10-CM | POA: Diagnosis not present

## 2016-03-17 DIAGNOSIS — E119 Type 2 diabetes mellitus without complications: Secondary | ICD-10-CM | POA: Diagnosis not present

## 2016-03-17 DIAGNOSIS — I890 Lymphedema, not elsewhere classified: Secondary | ICD-10-CM | POA: Diagnosis not present

## 2016-03-17 DIAGNOSIS — F0390 Unspecified dementia without behavioral disturbance: Secondary | ICD-10-CM | POA: Diagnosis not present

## 2016-03-18 DIAGNOSIS — I890 Lymphedema, not elsewhere classified: Secondary | ICD-10-CM | POA: Diagnosis not present

## 2016-03-18 DIAGNOSIS — L89620 Pressure ulcer of left heel, unstageable: Secondary | ICD-10-CM | POA: Diagnosis not present

## 2016-03-18 DIAGNOSIS — E119 Type 2 diabetes mellitus without complications: Secondary | ICD-10-CM | POA: Diagnosis not present

## 2016-03-18 DIAGNOSIS — I10 Essential (primary) hypertension: Secondary | ICD-10-CM | POA: Diagnosis not present

## 2016-03-18 DIAGNOSIS — L89152 Pressure ulcer of sacral region, stage 2: Secondary | ICD-10-CM | POA: Diagnosis not present

## 2016-03-18 DIAGNOSIS — F0390 Unspecified dementia without behavioral disturbance: Secondary | ICD-10-CM | POA: Diagnosis not present

## 2016-03-20 DIAGNOSIS — I10 Essential (primary) hypertension: Secondary | ICD-10-CM | POA: Diagnosis not present

## 2016-03-20 DIAGNOSIS — F0390 Unspecified dementia without behavioral disturbance: Secondary | ICD-10-CM | POA: Diagnosis not present

## 2016-03-20 DIAGNOSIS — E119 Type 2 diabetes mellitus without complications: Secondary | ICD-10-CM | POA: Diagnosis not present

## 2016-03-20 DIAGNOSIS — L89152 Pressure ulcer of sacral region, stage 2: Secondary | ICD-10-CM | POA: Diagnosis not present

## 2016-03-20 DIAGNOSIS — I890 Lymphedema, not elsewhere classified: Secondary | ICD-10-CM | POA: Diagnosis not present

## 2016-03-20 DIAGNOSIS — L89620 Pressure ulcer of left heel, unstageable: Secondary | ICD-10-CM | POA: Diagnosis not present

## 2016-03-21 DIAGNOSIS — F0390 Unspecified dementia without behavioral disturbance: Secondary | ICD-10-CM | POA: Diagnosis not present

## 2016-03-21 DIAGNOSIS — I10 Essential (primary) hypertension: Secondary | ICD-10-CM | POA: Diagnosis not present

## 2016-03-21 DIAGNOSIS — L89152 Pressure ulcer of sacral region, stage 2: Secondary | ICD-10-CM | POA: Diagnosis not present

## 2016-03-21 DIAGNOSIS — I890 Lymphedema, not elsewhere classified: Secondary | ICD-10-CM | POA: Diagnosis not present

## 2016-03-21 DIAGNOSIS — E119 Type 2 diabetes mellitus without complications: Secondary | ICD-10-CM | POA: Diagnosis not present

## 2016-03-21 DIAGNOSIS — L89620 Pressure ulcer of left heel, unstageable: Secondary | ICD-10-CM | POA: Diagnosis not present

## 2016-03-22 DIAGNOSIS — L89152 Pressure ulcer of sacral region, stage 2: Secondary | ICD-10-CM | POA: Diagnosis not present

## 2016-03-22 DIAGNOSIS — L89620 Pressure ulcer of left heel, unstageable: Secondary | ICD-10-CM | POA: Diagnosis not present

## 2016-03-22 DIAGNOSIS — I10 Essential (primary) hypertension: Secondary | ICD-10-CM | POA: Diagnosis not present

## 2016-03-22 DIAGNOSIS — I890 Lymphedema, not elsewhere classified: Secondary | ICD-10-CM | POA: Diagnosis not present

## 2016-03-22 DIAGNOSIS — E119 Type 2 diabetes mellitus without complications: Secondary | ICD-10-CM | POA: Diagnosis not present

## 2016-03-22 DIAGNOSIS — F0390 Unspecified dementia without behavioral disturbance: Secondary | ICD-10-CM | POA: Diagnosis not present

## 2016-03-24 DIAGNOSIS — L89620 Pressure ulcer of left heel, unstageable: Secondary | ICD-10-CM | POA: Diagnosis not present

## 2016-03-24 DIAGNOSIS — F0390 Unspecified dementia without behavioral disturbance: Secondary | ICD-10-CM | POA: Diagnosis not present

## 2016-03-24 DIAGNOSIS — L89152 Pressure ulcer of sacral region, stage 2: Secondary | ICD-10-CM | POA: Diagnosis not present

## 2016-03-24 DIAGNOSIS — I10 Essential (primary) hypertension: Secondary | ICD-10-CM | POA: Diagnosis not present

## 2016-03-24 DIAGNOSIS — I890 Lymphedema, not elsewhere classified: Secondary | ICD-10-CM | POA: Diagnosis not present

## 2016-03-24 DIAGNOSIS — E119 Type 2 diabetes mellitus without complications: Secondary | ICD-10-CM | POA: Diagnosis not present

## 2016-03-25 DIAGNOSIS — I890 Lymphedema, not elsewhere classified: Secondary | ICD-10-CM | POA: Diagnosis not present

## 2016-03-25 DIAGNOSIS — F0390 Unspecified dementia without behavioral disturbance: Secondary | ICD-10-CM | POA: Diagnosis not present

## 2016-03-25 DIAGNOSIS — L89152 Pressure ulcer of sacral region, stage 2: Secondary | ICD-10-CM | POA: Diagnosis not present

## 2016-03-25 DIAGNOSIS — I10 Essential (primary) hypertension: Secondary | ICD-10-CM | POA: Diagnosis not present

## 2016-03-25 DIAGNOSIS — L89620 Pressure ulcer of left heel, unstageable: Secondary | ICD-10-CM | POA: Diagnosis not present

## 2016-03-25 DIAGNOSIS — E119 Type 2 diabetes mellitus without complications: Secondary | ICD-10-CM | POA: Diagnosis not present

## 2016-03-27 DIAGNOSIS — L89152 Pressure ulcer of sacral region, stage 2: Secondary | ICD-10-CM | POA: Diagnosis not present

## 2016-03-27 DIAGNOSIS — F0390 Unspecified dementia without behavioral disturbance: Secondary | ICD-10-CM | POA: Diagnosis not present

## 2016-03-27 DIAGNOSIS — E119 Type 2 diabetes mellitus without complications: Secondary | ICD-10-CM | POA: Diagnosis not present

## 2016-03-27 DIAGNOSIS — L89620 Pressure ulcer of left heel, unstageable: Secondary | ICD-10-CM | POA: Diagnosis not present

## 2016-03-27 DIAGNOSIS — I10 Essential (primary) hypertension: Secondary | ICD-10-CM | POA: Diagnosis not present

## 2016-03-27 DIAGNOSIS — I890 Lymphedema, not elsewhere classified: Secondary | ICD-10-CM | POA: Diagnosis not present

## 2016-03-28 DIAGNOSIS — E119 Type 2 diabetes mellitus without complications: Secondary | ICD-10-CM | POA: Diagnosis not present

## 2016-03-28 DIAGNOSIS — L89152 Pressure ulcer of sacral region, stage 2: Secondary | ICD-10-CM | POA: Diagnosis not present

## 2016-03-28 DIAGNOSIS — F0390 Unspecified dementia without behavioral disturbance: Secondary | ICD-10-CM | POA: Diagnosis not present

## 2016-03-28 DIAGNOSIS — I10 Essential (primary) hypertension: Secondary | ICD-10-CM | POA: Diagnosis not present

## 2016-03-28 DIAGNOSIS — L89620 Pressure ulcer of left heel, unstageable: Secondary | ICD-10-CM | POA: Diagnosis not present

## 2016-03-28 DIAGNOSIS — I890 Lymphedema, not elsewhere classified: Secondary | ICD-10-CM | POA: Diagnosis not present

## 2016-03-29 DIAGNOSIS — L89152 Pressure ulcer of sacral region, stage 2: Secondary | ICD-10-CM | POA: Diagnosis not present

## 2016-03-29 DIAGNOSIS — I890 Lymphedema, not elsewhere classified: Secondary | ICD-10-CM | POA: Diagnosis not present

## 2016-03-29 DIAGNOSIS — F0390 Unspecified dementia without behavioral disturbance: Secondary | ICD-10-CM | POA: Diagnosis not present

## 2016-03-29 DIAGNOSIS — E119 Type 2 diabetes mellitus without complications: Secondary | ICD-10-CM | POA: Diagnosis not present

## 2016-03-29 DIAGNOSIS — L89620 Pressure ulcer of left heel, unstageable: Secondary | ICD-10-CM | POA: Diagnosis not present

## 2016-03-29 DIAGNOSIS — I10 Essential (primary) hypertension: Secondary | ICD-10-CM | POA: Diagnosis not present

## 2016-03-30 DIAGNOSIS — Z86711 Personal history of pulmonary embolism: Secondary | ICD-10-CM | POA: Diagnosis not present

## 2016-03-30 DIAGNOSIS — I890 Lymphedema, not elsewhere classified: Secondary | ICD-10-CM | POA: Diagnosis not present

## 2016-03-30 DIAGNOSIS — Z09 Encounter for follow-up examination after completed treatment for conditions other than malignant neoplasm: Secondary | ICD-10-CM | POA: Diagnosis not present

## 2016-03-30 DIAGNOSIS — E119 Type 2 diabetes mellitus without complications: Secondary | ICD-10-CM | POA: Diagnosis not present

## 2016-03-30 LAB — CBC AND DIFFERENTIAL
HEMATOCRIT: 34 % — AB (ref 36–46)
HEMOGLOBIN: 10.8 g/dL — AB (ref 12.0–16.0)
Platelets: 215 10*3/uL (ref 150–399)
WBC: 4.7 10^3/mL

## 2016-03-30 LAB — PROTIME-INR: Protime: 11.6 seconds (ref 10.0–13.8)

## 2016-03-30 LAB — BASIC METABOLIC PANEL
BUN: 15 mg/dL (ref 4–21)
Creatinine: 0.7 mg/dL (ref 0.5–1.1)
Glucose: 140 mg/dL
POTASSIUM: 4.2 mmol/L (ref 3.4–5.3)
SODIUM: 142 mmol/L (ref 137–147)

## 2016-03-30 LAB — TSH: TSH: 2.72 u[IU]/mL (ref 0.41–5.90)

## 2016-03-30 LAB — POCT INR: INR: 1.1 (ref 0.9–1.1)

## 2016-03-30 LAB — HEMOGLOBIN A1C: Hemoglobin A1C: 6.7

## 2016-03-30 LAB — LIPID PANEL
Cholesterol: 143 mg/dL (ref 0–200)
HDL: 51 mg/dL (ref 35–70)
LDL Cholesterol: 80 mg/dL
Triglycerides: 123 mg/dL (ref 40–160)

## 2016-03-30 LAB — HEPATIC FUNCTION PANEL
ALT: 14 U/L (ref 7–35)
AST: 15 U/L (ref 13–35)
Alkaline Phosphatase: 62 U/L (ref 25–125)
BILIRUBIN, TOTAL: 0.1 mg/dL

## 2016-04-04 DIAGNOSIS — E119 Type 2 diabetes mellitus without complications: Secondary | ICD-10-CM | POA: Diagnosis not present

## 2016-04-04 DIAGNOSIS — I10 Essential (primary) hypertension: Secondary | ICD-10-CM | POA: Diagnosis not present

## 2016-04-04 DIAGNOSIS — I890 Lymphedema, not elsewhere classified: Secondary | ICD-10-CM | POA: Diagnosis not present

## 2016-04-04 DIAGNOSIS — L89152 Pressure ulcer of sacral region, stage 2: Secondary | ICD-10-CM | POA: Diagnosis not present

## 2016-04-04 DIAGNOSIS — L89620 Pressure ulcer of left heel, unstageable: Secondary | ICD-10-CM | POA: Diagnosis not present

## 2016-04-04 DIAGNOSIS — F0390 Unspecified dementia without behavioral disturbance: Secondary | ICD-10-CM | POA: Diagnosis not present

## 2016-04-06 DIAGNOSIS — F0390 Unspecified dementia without behavioral disturbance: Secondary | ICD-10-CM | POA: Diagnosis not present

## 2016-04-06 DIAGNOSIS — I890 Lymphedema, not elsewhere classified: Secondary | ICD-10-CM | POA: Diagnosis not present

## 2016-04-06 DIAGNOSIS — L89620 Pressure ulcer of left heel, unstageable: Secondary | ICD-10-CM | POA: Diagnosis not present

## 2016-04-06 DIAGNOSIS — L89152 Pressure ulcer of sacral region, stage 2: Secondary | ICD-10-CM | POA: Diagnosis not present

## 2016-04-06 DIAGNOSIS — E119 Type 2 diabetes mellitus without complications: Secondary | ICD-10-CM | POA: Diagnosis not present

## 2016-04-06 DIAGNOSIS — I10 Essential (primary) hypertension: Secondary | ICD-10-CM | POA: Diagnosis not present

## 2016-04-07 DIAGNOSIS — L89620 Pressure ulcer of left heel, unstageable: Secondary | ICD-10-CM | POA: Diagnosis not present

## 2016-04-07 DIAGNOSIS — I10 Essential (primary) hypertension: Secondary | ICD-10-CM | POA: Diagnosis not present

## 2016-04-07 DIAGNOSIS — L89152 Pressure ulcer of sacral region, stage 2: Secondary | ICD-10-CM | POA: Diagnosis not present

## 2016-04-07 DIAGNOSIS — E119 Type 2 diabetes mellitus without complications: Secondary | ICD-10-CM | POA: Diagnosis not present

## 2016-04-07 DIAGNOSIS — I890 Lymphedema, not elsewhere classified: Secondary | ICD-10-CM | POA: Diagnosis not present

## 2016-04-07 DIAGNOSIS — F0390 Unspecified dementia without behavioral disturbance: Secondary | ICD-10-CM | POA: Diagnosis not present

## 2016-04-11 DIAGNOSIS — L89152 Pressure ulcer of sacral region, stage 2: Secondary | ICD-10-CM | POA: Diagnosis not present

## 2016-04-11 DIAGNOSIS — I890 Lymphedema, not elsewhere classified: Secondary | ICD-10-CM | POA: Diagnosis not present

## 2016-04-11 DIAGNOSIS — I10 Essential (primary) hypertension: Secondary | ICD-10-CM | POA: Diagnosis not present

## 2016-04-11 DIAGNOSIS — F0390 Unspecified dementia without behavioral disturbance: Secondary | ICD-10-CM | POA: Diagnosis not present

## 2016-04-11 DIAGNOSIS — L89620 Pressure ulcer of left heel, unstageable: Secondary | ICD-10-CM | POA: Diagnosis not present

## 2016-04-11 DIAGNOSIS — E119 Type 2 diabetes mellitus without complications: Secondary | ICD-10-CM | POA: Diagnosis not present

## 2016-04-13 DIAGNOSIS — F0390 Unspecified dementia without behavioral disturbance: Secondary | ICD-10-CM | POA: Diagnosis not present

## 2016-04-13 DIAGNOSIS — L89620 Pressure ulcer of left heel, unstageable: Secondary | ICD-10-CM | POA: Diagnosis not present

## 2016-04-13 DIAGNOSIS — E119 Type 2 diabetes mellitus without complications: Secondary | ICD-10-CM | POA: Diagnosis not present

## 2016-04-13 DIAGNOSIS — L89152 Pressure ulcer of sacral region, stage 2: Secondary | ICD-10-CM | POA: Diagnosis not present

## 2016-04-13 DIAGNOSIS — I890 Lymphedema, not elsewhere classified: Secondary | ICD-10-CM | POA: Diagnosis not present

## 2016-04-13 DIAGNOSIS — I10 Essential (primary) hypertension: Secondary | ICD-10-CM | POA: Diagnosis not present

## 2016-04-14 DIAGNOSIS — I890 Lymphedema, not elsewhere classified: Secondary | ICD-10-CM | POA: Diagnosis not present

## 2016-04-14 DIAGNOSIS — L89152 Pressure ulcer of sacral region, stage 2: Secondary | ICD-10-CM | POA: Diagnosis not present

## 2016-04-14 DIAGNOSIS — L89620 Pressure ulcer of left heel, unstageable: Secondary | ICD-10-CM | POA: Diagnosis not present

## 2016-04-14 DIAGNOSIS — I10 Essential (primary) hypertension: Secondary | ICD-10-CM | POA: Diagnosis not present

## 2016-04-14 DIAGNOSIS — F0390 Unspecified dementia without behavioral disturbance: Secondary | ICD-10-CM | POA: Diagnosis not present

## 2016-04-14 DIAGNOSIS — E119 Type 2 diabetes mellitus without complications: Secondary | ICD-10-CM | POA: Diagnosis not present

## 2016-04-18 DIAGNOSIS — E119 Type 2 diabetes mellitus without complications: Secondary | ICD-10-CM | POA: Diagnosis not present

## 2016-04-18 DIAGNOSIS — I10 Essential (primary) hypertension: Secondary | ICD-10-CM | POA: Diagnosis not present

## 2016-04-18 DIAGNOSIS — L89620 Pressure ulcer of left heel, unstageable: Secondary | ICD-10-CM | POA: Diagnosis not present

## 2016-04-18 DIAGNOSIS — F0390 Unspecified dementia without behavioral disturbance: Secondary | ICD-10-CM | POA: Diagnosis not present

## 2016-04-18 DIAGNOSIS — L89152 Pressure ulcer of sacral region, stage 2: Secondary | ICD-10-CM | POA: Diagnosis not present

## 2016-04-18 DIAGNOSIS — I890 Lymphedema, not elsewhere classified: Secondary | ICD-10-CM | POA: Diagnosis not present

## 2016-04-19 DIAGNOSIS — L89152 Pressure ulcer of sacral region, stage 2: Secondary | ICD-10-CM | POA: Diagnosis not present

## 2016-04-19 DIAGNOSIS — I890 Lymphedema, not elsewhere classified: Secondary | ICD-10-CM | POA: Diagnosis not present

## 2016-04-19 DIAGNOSIS — I10 Essential (primary) hypertension: Secondary | ICD-10-CM | POA: Diagnosis not present

## 2016-04-19 DIAGNOSIS — L89620 Pressure ulcer of left heel, unstageable: Secondary | ICD-10-CM | POA: Diagnosis not present

## 2016-04-19 DIAGNOSIS — F0390 Unspecified dementia without behavioral disturbance: Secondary | ICD-10-CM | POA: Diagnosis not present

## 2016-04-19 DIAGNOSIS — E119 Type 2 diabetes mellitus without complications: Secondary | ICD-10-CM | POA: Diagnosis not present

## 2016-04-20 DIAGNOSIS — I10 Essential (primary) hypertension: Secondary | ICD-10-CM | POA: Diagnosis not present

## 2016-04-20 DIAGNOSIS — L89152 Pressure ulcer of sacral region, stage 2: Secondary | ICD-10-CM | POA: Diagnosis not present

## 2016-04-20 DIAGNOSIS — E119 Type 2 diabetes mellitus without complications: Secondary | ICD-10-CM | POA: Diagnosis not present

## 2016-04-20 DIAGNOSIS — I890 Lymphedema, not elsewhere classified: Secondary | ICD-10-CM | POA: Diagnosis not present

## 2016-04-20 DIAGNOSIS — F0390 Unspecified dementia without behavioral disturbance: Secondary | ICD-10-CM | POA: Diagnosis not present

## 2016-04-20 DIAGNOSIS — L89620 Pressure ulcer of left heel, unstageable: Secondary | ICD-10-CM | POA: Diagnosis not present

## 2016-04-25 DIAGNOSIS — I890 Lymphedema, not elsewhere classified: Secondary | ICD-10-CM | POA: Diagnosis not present

## 2016-04-25 DIAGNOSIS — E119 Type 2 diabetes mellitus without complications: Secondary | ICD-10-CM | POA: Diagnosis not present

## 2016-04-25 DIAGNOSIS — L89152 Pressure ulcer of sacral region, stage 2: Secondary | ICD-10-CM | POA: Diagnosis not present

## 2016-04-25 DIAGNOSIS — L89620 Pressure ulcer of left heel, unstageable: Secondary | ICD-10-CM | POA: Diagnosis not present

## 2016-04-25 DIAGNOSIS — I10 Essential (primary) hypertension: Secondary | ICD-10-CM | POA: Diagnosis not present

## 2016-04-25 DIAGNOSIS — F0390 Unspecified dementia without behavioral disturbance: Secondary | ICD-10-CM | POA: Diagnosis not present

## 2016-04-27 DIAGNOSIS — F0390 Unspecified dementia without behavioral disturbance: Secondary | ICD-10-CM | POA: Diagnosis not present

## 2016-04-27 DIAGNOSIS — E119 Type 2 diabetes mellitus without complications: Secondary | ICD-10-CM | POA: Diagnosis not present

## 2016-04-27 DIAGNOSIS — I10 Essential (primary) hypertension: Secondary | ICD-10-CM | POA: Diagnosis not present

## 2016-04-27 DIAGNOSIS — I890 Lymphedema, not elsewhere classified: Secondary | ICD-10-CM | POA: Diagnosis not present

## 2016-04-27 DIAGNOSIS — L89152 Pressure ulcer of sacral region, stage 2: Secondary | ICD-10-CM | POA: Diagnosis not present

## 2016-04-27 DIAGNOSIS — L89620 Pressure ulcer of left heel, unstageable: Secondary | ICD-10-CM | POA: Diagnosis not present

## 2016-04-28 DIAGNOSIS — F0390 Unspecified dementia without behavioral disturbance: Secondary | ICD-10-CM | POA: Diagnosis not present

## 2016-04-28 DIAGNOSIS — L89152 Pressure ulcer of sacral region, stage 2: Secondary | ICD-10-CM | POA: Diagnosis not present

## 2016-04-28 DIAGNOSIS — I890 Lymphedema, not elsewhere classified: Secondary | ICD-10-CM | POA: Diagnosis not present

## 2016-04-28 DIAGNOSIS — E119 Type 2 diabetes mellitus without complications: Secondary | ICD-10-CM | POA: Diagnosis not present

## 2016-04-28 DIAGNOSIS — L89620 Pressure ulcer of left heel, unstageable: Secondary | ICD-10-CM | POA: Diagnosis not present

## 2016-04-28 DIAGNOSIS — I10 Essential (primary) hypertension: Secondary | ICD-10-CM | POA: Diagnosis not present

## 2016-05-05 DIAGNOSIS — L89620 Pressure ulcer of left heel, unstageable: Secondary | ICD-10-CM | POA: Diagnosis not present

## 2016-05-05 DIAGNOSIS — I890 Lymphedema, not elsewhere classified: Secondary | ICD-10-CM | POA: Diagnosis not present

## 2016-05-05 DIAGNOSIS — E119 Type 2 diabetes mellitus without complications: Secondary | ICD-10-CM | POA: Diagnosis not present

## 2016-05-05 DIAGNOSIS — L89152 Pressure ulcer of sacral region, stage 2: Secondary | ICD-10-CM | POA: Diagnosis not present

## 2016-05-05 DIAGNOSIS — I10 Essential (primary) hypertension: Secondary | ICD-10-CM | POA: Diagnosis not present

## 2016-05-05 DIAGNOSIS — F0390 Unspecified dementia without behavioral disturbance: Secondary | ICD-10-CM | POA: Diagnosis not present

## 2016-05-11 ENCOUNTER — Inpatient Hospital Stay (HOSPITAL_COMMUNITY)
Admission: EM | Admit: 2016-05-11 | Discharge: 2016-05-15 | DRG: 069 | Disposition: A | Discharge status: Home Health | Payer: Medicare Other | Attending: Family Medicine | Admitting: Family Medicine

## 2016-05-11 ENCOUNTER — Observation Stay (HOSPITAL_COMMUNITY): Payer: Medicare Other

## 2016-05-11 ENCOUNTER — Encounter (HOSPITAL_COMMUNITY): Payer: Self-pay | Admitting: Adult Health

## 2016-05-11 ENCOUNTER — Emergency Department (HOSPITAL_COMMUNITY): Payer: Medicare Other

## 2016-05-11 DIAGNOSIS — I10 Essential (primary) hypertension: Secondary | ICD-10-CM | POA: Diagnosis not present

## 2016-05-11 DIAGNOSIS — I639 Cerebral infarction, unspecified: Secondary | ICD-10-CM

## 2016-05-11 DIAGNOSIS — Z7401 Bed confinement status: Secondary | ICD-10-CM

## 2016-05-11 DIAGNOSIS — G459 Transient cerebral ischemic attack, unspecified: Secondary | ICD-10-CM | POA: Diagnosis not present

## 2016-05-11 DIAGNOSIS — D6859 Other primary thrombophilia: Secondary | ICD-10-CM | POA: Diagnosis not present

## 2016-05-11 DIAGNOSIS — R531 Weakness: Secondary | ICD-10-CM

## 2016-05-11 DIAGNOSIS — G8194 Hemiplegia, unspecified affecting left nondominant side: Secondary | ICD-10-CM | POA: Diagnosis not present

## 2016-05-11 DIAGNOSIS — Z86711 Personal history of pulmonary embolism: Secondary | ICD-10-CM

## 2016-05-11 DIAGNOSIS — Z7901 Long term (current) use of anticoagulants: Secondary | ICD-10-CM

## 2016-05-11 DIAGNOSIS — R402413 Glasgow coma scale score 13-15, at hospital admission: Secondary | ICD-10-CM | POA: Diagnosis present

## 2016-05-11 DIAGNOSIS — R29810 Facial weakness: Secondary | ICD-10-CM | POA: Diagnosis present

## 2016-05-11 DIAGNOSIS — D649 Anemia, unspecified: Secondary | ICD-10-CM | POA: Diagnosis not present

## 2016-05-11 DIAGNOSIS — E119 Type 2 diabetes mellitus without complications: Secondary | ICD-10-CM | POA: Diagnosis not present

## 2016-05-11 DIAGNOSIS — R4781 Slurred speech: Secondary | ICD-10-CM | POA: Diagnosis not present

## 2016-05-11 DIAGNOSIS — Z7984 Long term (current) use of oral hypoglycemic drugs: Secondary | ICD-10-CM

## 2016-05-11 DIAGNOSIS — I071 Rheumatic tricuspid insufficiency: Secondary | ICD-10-CM | POA: Diagnosis not present

## 2016-05-11 DIAGNOSIS — Z79899 Other long term (current) drug therapy: Secondary | ICD-10-CM

## 2016-05-11 DIAGNOSIS — Z86718 Personal history of other venous thrombosis and embolism: Secondary | ICD-10-CM | POA: Diagnosis not present

## 2016-05-11 DIAGNOSIS — I890 Lymphedema, not elsewhere classified: Secondary | ICD-10-CM | POA: Diagnosis present

## 2016-05-11 DIAGNOSIS — R471 Dysarthria and anarthria: Secondary | ICD-10-CM | POA: Diagnosis present

## 2016-05-11 DIAGNOSIS — E785 Hyperlipidemia, unspecified: Secondary | ICD-10-CM | POA: Diagnosis present

## 2016-05-11 DIAGNOSIS — F0390 Unspecified dementia without behavioral disturbance: Secondary | ICD-10-CM | POA: Diagnosis present

## 2016-05-11 DIAGNOSIS — E1142 Type 2 diabetes mellitus with diabetic polyneuropathy: Secondary | ICD-10-CM

## 2016-05-11 DIAGNOSIS — R627 Adult failure to thrive: Secondary | ICD-10-CM | POA: Diagnosis present

## 2016-05-11 LAB — I-STAT CHEM 8, ED
BUN: 15 mg/dL (ref 6–20)
CALCIUM ION: 1.23 mmol/L (ref 1.15–1.40)
CHLORIDE: 98 mmol/L — AB (ref 101–111)
Creatinine, Ser: 0.5 mg/dL (ref 0.44–1.00)
Glucose, Bld: 177 mg/dL — ABNORMAL HIGH (ref 65–99)
HCT: 35 % — ABNORMAL LOW (ref 36.0–46.0)
HEMOGLOBIN: 11.9 g/dL — AB (ref 12.0–15.0)
Potassium: 3.7 mmol/L (ref 3.5–5.1)
SODIUM: 141 mmol/L (ref 135–145)
TCO2: 30 mmol/L (ref 0–100)

## 2016-05-11 LAB — CBC
HEMATOCRIT: 35.7 % — AB (ref 36.0–46.0)
HEMOGLOBIN: 11.2 g/dL — AB (ref 12.0–15.0)
MCH: 28.4 pg (ref 26.0–34.0)
MCHC: 31.4 g/dL (ref 30.0–36.0)
MCV: 90.6 fL (ref 78.0–100.0)
Platelets: 223 10*3/uL (ref 150–400)
RBC: 3.94 MIL/uL (ref 3.87–5.11)
RDW: 13 % (ref 11.5–15.5)
WBC: 4.4 10*3/uL (ref 4.0–10.5)

## 2016-05-11 LAB — COMPREHENSIVE METABOLIC PANEL
ALT: 10 U/L — ABNORMAL LOW (ref 14–54)
ANION GAP: 10 (ref 5–15)
AST: 17 U/L (ref 15–41)
Albumin: 3.3 g/dL — ABNORMAL LOW (ref 3.5–5.0)
Alkaline Phosphatase: 62 U/L (ref 38–126)
BILIRUBIN TOTAL: 0.3 mg/dL (ref 0.3–1.2)
BUN: 13 mg/dL (ref 6–20)
CO2: 29 mmol/L (ref 22–32)
Calcium: 9.3 mg/dL (ref 8.9–10.3)
Chloride: 99 mmol/L — ABNORMAL LOW (ref 101–111)
Creatinine, Ser: 0.57 mg/dL (ref 0.44–1.00)
GFR calc non Af Amer: >60 mL/min (ref >60)
GLUCOSE: 171 mg/dL — AB (ref 65–99)
POTASSIUM: 3.8 mmol/L (ref 3.5–5.1)
SODIUM: 138 mmol/L (ref 135–145)
TOTAL PROTEIN: 6.8 g/dL (ref 6.5–8.1)

## 2016-05-11 LAB — DIFFERENTIAL
BASOS ABS: 0 10*3/uL (ref 0.0–0.1)
Basophils Relative: 1 %
EOS ABS: 0 10*3/uL (ref 0.0–0.7)
EOS PCT: 1 %
LYMPHS ABS: 1.7 10*3/uL (ref 0.7–4.0)
LYMPHS PCT: 38 %
MONO ABS: 0.4 10*3/uL (ref 0.1–1.0)
MONOS PCT: 8 %
NEUTROS ABS: 2.3 10*3/uL (ref 1.7–7.7)
Neutrophils Relative %: 52 %

## 2016-05-11 LAB — GLUCOSE, CAPILLARY
GLUCOSE-CAPILLARY: 155 mg/dL — AB (ref 65–99)
Glucose-Capillary: 108 mg/dL — ABNORMAL HIGH (ref 65–99)

## 2016-05-11 LAB — CBG MONITORING, ED: GLUCOSE-CAPILLARY: 192 mg/dL — AB (ref 65–99)

## 2016-05-11 LAB — PROTIME-INR
INR: 1.14
Prothrombin Time: 14.7 seconds (ref 11.4–15.2)

## 2016-05-11 LAB — I-STAT TROPONIN, ED: Troponin i, poc: 0.01 ng/mL (ref 0.00–0.08)

## 2016-05-11 LAB — APTT: APTT: 32 s (ref 24–36)

## 2016-05-11 MED ORDER — LORAZEPAM 2 MG/ML IJ SOLN
0.5000 mg | Freq: Once | INTRAMUSCULAR | Status: AC
  Administered 2016-05-12: 0.5 mg via INTRAVENOUS
  Filled 2016-05-11: qty 1

## 2016-05-11 MED ORDER — ATORVASTATIN CALCIUM 20 MG PO TABS: 20.0000 mg | ORAL_TABLET | Freq: Every day | ORAL | Status: DC

## 2016-05-11 MED ORDER — GABAPENTIN 300 MG PO CAPS
300.0000 mg | ORAL_CAPSULE | Freq: Three times a day (TID) | ORAL | Status: DC
  Administered 2016-05-12 – 2016-05-15 (×10): 300 mg via ORAL
  Filled 2016-05-11 (×10): qty 1

## 2016-05-11 MED ORDER — ASPIRIN 300 MG RE SUPP: 300.0000 mg | Freq: Every day | RECTAL | Status: DC

## 2016-05-11 MED ORDER — ACETAMINOPHEN 650 MG RE SUPP: 650.0000 mg | RECTAL | Status: DC | PRN

## 2016-05-11 MED ORDER — ACETAMINOPHEN 325 MG PO TABS: 650.0000 mg | ORAL_TABLET | Freq: Four times a day (QID) | ORAL | Status: DC | PRN

## 2016-05-11 MED ORDER — SODIUM CHLORIDE 0.9 % IV SOLN
INTRAVENOUS | Status: AC
  Administered 2016-05-11: 1000 mL via INTRAVENOUS

## 2016-05-11 MED ORDER — INSULIN ASPART 100 UNIT/ML ~~LOC~~ SOLN
0.0000 [IU] | SUBCUTANEOUS | Status: DC
  Administered 2016-05-12: 2 [IU] via SUBCUTANEOUS

## 2016-05-11 MED ORDER — LABETALOL HCL 5 MG/ML IV SOLN
5.0000 mg | INTRAVENOUS | Status: DC | PRN
  Filled 2016-05-11 (×2): qty 4

## 2016-05-11 MED ORDER — ACETAMINOPHEN 325 MG PO TABS: 650.0000 mg | ORAL_TABLET | ORAL | Status: DC | PRN

## 2016-05-11 MED ORDER — ATORVASTATIN CALCIUM 40 MG PO TABS
40.0000 mg | ORAL_TABLET | Freq: Every day | ORAL | Status: DC
  Administered 2016-05-12 – 2016-05-14 (×3): 40 mg via ORAL
  Filled 2016-05-11 (×3): qty 1

## 2016-05-11 MED ORDER — SENNOSIDES-DOCUSATE SODIUM 8.6-50 MG PO TABS
1.0000 | ORAL_TABLET | Freq: Every evening | ORAL | Status: DC | PRN
  Filled 2016-05-11: qty 1

## 2016-05-11 MED ORDER — RIVASTIGMINE 4.6 MG/24HR TD PT24
13.3000 mg | MEDICATED_PATCH | Freq: Every day | TRANSDERMAL | Status: DC
  Administered 2016-05-12 – 2016-05-15 (×4): 13.8 mg via TRANSDERMAL
  Filled 2016-05-11 (×4): qty 3

## 2016-05-11 MED ORDER — ASPIRIN 325 MG PO TABS
325.0000 mg | ORAL_TABLET | Freq: Every day | ORAL | Status: DC
  Administered 2016-05-11 – 2016-05-15 (×5): 325 mg via ORAL
  Filled 2016-05-11 (×5): qty 1

## 2016-05-11 MED ORDER — ACETAMINOPHEN 160 MG/5ML PO SOLN: 650.0000 mg | ORAL | Status: DC | PRN

## 2016-05-11 MED ORDER — STROKE: EARLY STAGES OF RECOVERY BOOK
Freq: Once | Status: AC
  Administered 2016-05-11: 22:00:00
  Filled 2016-05-11: qty 1

## 2016-05-11 MED ORDER — RIVASTIGMINE 13.3 MG/24HR TD PT24: 1.0000 | MEDICATED_PATCH | Freq: Every day | TRANSDERMAL | Status: DC

## 2016-05-11 MED ORDER — PANTOPRAZOLE SODIUM 40 MG PO TBEC
40.0000 mg | DELAYED_RELEASE_TABLET | Freq: Every day | ORAL | Status: DC
  Administered 2016-05-12 – 2016-05-15 (×4): 40 mg via ORAL
  Filled 2016-05-11 (×4): qty 1

## 2016-05-11 MED ORDER — ENOXAPARIN SODIUM 40 MG/0.4ML ~~LOC~~ SOLN
40.0000 mg | SUBCUTANEOUS | Status: DC
  Administered 2016-05-11 – 2016-05-14 (×4): 40 mg via SUBCUTANEOUS
  Filled 2016-05-11 (×5): qty 0.4

## 2016-05-11 NOTE — ED Triage Notes (Addendum)
Presents with left sided weakness, drooling, slurred speech-per daughter in law, they are unsure when exactly the symptoms began-she was last known well last night, but today the symptoms are coming and going. No facial droop or slurred speech at this time. No drooling. Left sided arm drift noted. Pt has dementia. She is alert. She is leaning to left side, which is new over last few days.

## 2016-05-11 NOTE — ED Notes (Signed)
Admitting at bedside 

## 2016-05-11 NOTE — H&P (Signed)
History and Physical    Winnette Gilles [id number] DOB: 1935-08-31 DOA: 05/11/2016  PCP: Corinna Lines, MD   Patient coming from: Home  Chief Complaint: Left arm weakness, left facial droop, slurred speech   HPI: [name] is a 81 y.o. female with medical history significant for non-insulin-dependent diabetes mellitus, hypertension, hyperlipidemia, lymphedema of the left leg, and history of remote DVT and PE on Coumadin who presents to the emergency department for evaluation of left arm weakness with left facial droop, drooling, and slurred speech. Patient was seen in her usual state last night and seemed to be normal in bed this morning when her daughter-in-law saw her. Later in the morning, a home health aide noted the patient's left arm to be flaccid with a left facial droop, drooling, and slurred speech. The daughter-in-law are returned to the home, initially finding the patient back to her usual state, but then noting a recurrence in the left arm weakness and dysarthria. Patient did not have any complaints at the time and there had been no recent illness reported. There is been no recent fall or trauma, no alcohol or illicit substance use, and no recent changes to her medications. Patient is managed with Coumadin for a history of remote DVT and PE, but her daughter-in-law notes that INR has been only slightly above 1 on consecutive recent measurements, but her clinician did not want to increase her dose secondary to fall risk.  ED Course: Upon arrival to the ED, patient is found to be afebrile, saturating well on room air, hypertensive to 200/90, and with vitals otherwise stable. EKG features a sinus rhythm with PACs. Noncontrast head CT is negative for acute intracranial hemorrhage, midline shift, or large vascular territory infarct, but notable for moderate periventricular and subcortical white matter hypodensity, likely representative of chronic small vessel ischemic disease. Chemistry  panel is notable only for a serum glucose of 171 and albumin of 3.3. CBC features a mild and stable normocytic anemia with hemoglobin of 11.2. INR is subtherapeutic at 1.14 and troponin is negative at 0.01. Patient had some mild left arm weakness in the ED, but deficits and otherwise seemed to be resolving. She remained hypertensive, but otherwise stable. Neurology was consulted by the ED physician and advised a medical admission for further evaluation and management of possible TIA/CVA.  Review of Systems:  All other systems reviewed and apart from HPI, are negative.  Past Medical History:  Diagnosis Date  . Dementia   . Diabetes mellitus type II 03/21/2011  . DVT (deep venous thrombosis) ([place]) 03/21/2011  . Gallstones   . Hyperlipidemia 03/21/2011  . Hypertension 03/21/2011  . Lymphedema of leg 03/21/2011  . Pulmonary embolism ([place]) 03/21/2011    Past Surgical History:  Procedure Laterality Date  . VASCULAR SURGERY       reports that she has never smoked. She has never used smokeless tobacco. She reports that she does not drink alcohol or use drugs.  No Known Allergies  History reviewed. No pertinent family history.   Prior to Admission medications   Medication Sig Start Date End Date Taking? Authorizing Provider  atorvastatin (LIPITOR) 20 MG tablet Take 20 mg by mouth daily at 6 PM.    Yes Historical Provider, MD  cloNIDine (CATAPRES) 0.1 MG tablet Take 0.1 mg by mouth 2 (two) times daily.   Yes Historical Provider, MD  Dexlansoprazole (DEXILANT) 30 MG capsule Take 30 mg by mouth daily.   Yes Historical Provider, MD  gabapentin (NEURONTIN) 300 MG capsule  Take 300 mg by mouth 3 (three) times daily.   Yes Historical Provider, MD  metFORMIN (GLUCOPHAGE) 1000 MG tablet Take 1,000 mg by mouth 2 (two) times daily with a meal.   Yes Historical Provider, MD  Rivastigmine (EXELON) 13.3 MG/24HR PT24 Place 1 patch onto the skin daily.   Yes Historical Provider, MD  warfarin (COUMADIN) 2  MG tablet Take 2 mg by mouth daily at 6 PM.   Yes Historical Provider, MD    Physical Exam: Vitals:   05/11/16 1609 05/11/16 1725 05/11/16 1726 05/11/16 1800  BP:  179/89  195/98  Pulse:   72 71  Resp:  16 15 14   Temp:      TempSrc:      SpO2:   99% 100%  Weight: 54.4 kg (120 lb)         Constitutional: NAD, calm, comfortable, frail Eyes: PERTLA, lids and conjunctivae normal ENMT: Mucous membranes are moist. Posterior pharynx clear of any exudate or lesions.   Neck: normal, supple, no masses, no thyromegaly Respiratory: clear to auscultation bilaterally, no wheezing, no crackles. Normal respiratory effort.  Cardiovascular: S1 & S2 heard, regular rate and rhythm, soft systolic murmur at upper sternal border. No extremity edema. Neck veins appear mildly distended. Abdomen: No distension, no tenderness, no masses palpated. Bowel sounds normal.  Musculoskeletal: no clubbing / cyanosis. No joint deformity upper and lower extremities. Normal muscle tone.  Skin: no significant rashes, lesions, ulcers. Warm, dry, well-perfused. Neurologic: CN 2-12 grossly intact. Sensation to light touch intact. Strength 5/5 throughout RUE and RLL; left grip strength 3-4/5 initially, then 5/5 with continued effort.  Psychiatric: Normal judgment and insight. Alert and oriented x 3.       Labs on Admission: I have personally reviewed following labs and imaging studies  CBC:  Recent Labs Lab 05/11/16 1614 05/11/16 1633  WBC 4.4  --   NEUTROABS 2.3  --   HGB 11.2* 11.9*  HCT 35.7* 35.0*  MCV 90.6  --   PLT 223  --    Basic Metabolic Panel:  Recent Labs Lab 05/11/16 1614 05/11/16 1633  NA 138 141  K 3.8 3.7  CL 99* 98*  CO2 29  --   GLUCOSE 171* 177*  BUN 13 15  CREATININE 0.57 0.50  CALCIUM 9.3  --    GFR: CrCl cannot be calculated (Unknown ideal weight.). Liver Function Tests:  Recent Labs Lab 05/11/16 1614  AST 17  ALT 10*  ALKPHOS 62  BILITOT 0.3  PROT 6.8  ALBUMIN 3.3*    No results for input(s): LIPASE, AMYLASE in the last 168 hours. No results for input(s): AMMONIA in the last 168 hours. Coagulation Profile:  Recent Labs Lab 05/11/16 1614  INR 1.14   Cardiac Enzymes: No results for input(s): CKTOTAL, CKMB, CKMBINDEX, TROPONINI in the last 168 hours. BNP (last 3 results) No results for input(s): PROBNP in the last 8760 hours. HbA1C: No results for input(s): HGBA1C in the last 72 hours. CBG:  Recent Labs Lab 05/11/16 1621  GLUCAP 192*   Lipid Profile: No results for input(s): CHOL, HDL, LDLCALC, TRIG, CHOLHDL, LDLDIRECT in the last 72 hours. Thyroid Function Tests: No results for input(s): TSH, T4TOTAL, FREET4, T3FREE, THYROIDAB in the last 72 hours. Anemia Panel: No results for input(s): VITAMINB12, FOLATE, FERRITIN, TIBC, IRON, RETICCTPCT in the last 72 hours. Urine analysis: No results found for: COLORURINE, APPEARANCEUR, LABSPEC, PHURINE, GLUCOSEU, HGBUR, BILIRUBINUR, KETONESUR, PROTEINUR, UROBILINOGEN, NITRITE, LEUKOCYTESUR Sepsis Labs: @LABRCNTIP (procalcitonin:4,lacticidven:4) )No results found  for this or any previous visit (from the past 240 hour(s)).   Radiological Exams on Admission: Ct Head Wo Contrast  Result Date: 05/11/2016 CLINICAL DATA:  Left-sided weakness with drooling and slurred speech. EXAM: CT HEAD WITHOUT CONTRAST TECHNIQUE: Contiguous axial images were obtained from the base of the skull through the vertex without intravenous contrast. COMPARISON:  None. FINDINGS: Brain: Moderate degree of periventricular and subcortical white matter hypodensity consistent with small vessel ischemic disease likely chronic. Moderate ventriculomegaly consistent with central atrophy. Chronic left cerebellar infarct. No acute intracranial hemorrhage, midline shift or edema. No extra-axial fluid collection. No effacement of the basal cisterns or fourth ventricle. No acute large vascular territory Vascular: No hyperdense vessels.  Calcifications of both carotid siphons. Skull: Nonacute Sinuses/Orbits: Symmetric appearing orbits. Clear mastoids. The visualized paranasal sinuses are unremarkable. Other: None IMPRESSION: Moderate degree of periventricular and subcortical white matter hypodensity likely reflecting chronic small vessel ischemic disease. Bilateral carotid siphon calcifications. Central atrophy. No acute intracranial hemorrhage, midline shift nor large vascular territory infarction. Chronic left cerebellar infarct. Electronically Signed   By: Ashley Royalty M.D.   On: 05/11/2016 16:56    EKG: Independently reviewed. Sinus rhythm, PACs  Assessment/Plan  1. Left-sided weakness, dysarthria - Sxs have been waxing and waning since the morning of admission  - Sxs are resolved, or very nearly resolved by time of admission  - Head CT negative for acute intracranial abnormality; basic labs unremarkable; vitals notable for markedly elevated BP  - Neurology is consulting and much appreciated - No tPA given resolved sxs and unclear LKW   - Plan to monitor on telemetry with frequent neuro checks, NPO pending swallow screen - PT/OT/SLP evals are requested  - Further eval with MRI/MRA, carotid US, TTE, A1c, fasting lipids  - Continue statin, increase warfarin to therapeutic levels - Permissive HTN to 210/110 in acute phase  2. Hx of DVT and PE - Pt has remote hx of DVT and PE and has been managed with coumadin  - INR has been just over 1 on consecutive recent measurements, but per family, dose was not increased d/t concerns regarding falls  - She has been bedbound since last summer and may no longer present a fall-risk  - Neurology has advised increasing coumadin to therapeutic levels; will continue prophylactic sq Lovenox for now as INR only 1.14   3. Type II DM  - No A1c on file  - Managed with metformin only at home; this will be held  - Check CBG q4h until passes swallow eval  - Start with a low-intensity SSI  correctional only and adjust prn  - A1c pending  4. Hypertension  - BP markedly elevated in ED - She is managed with clonidine at home; this is held on admission  - Plan to permit hypertension to 210/110 in acute phase of suspected TIA/CVA; labetalol IVPs available prn    5. Hyperlipidemia  - Continue Lipitor, increased to 40 mg qHS  - Fasting lipid panel pending   6. Generalized weakness, failure to thrive - Pt has been bedbound since last summer and moved here from [place] at that time so that local children could help care for her  - Unclear etiology for this; will check thyroid studies, B12, folate levels; MRI brain pending     DVT prophylaxis: sq Lovenox  Code Status: Full  Family Communication: [name] updated at bedside with patient's permission Disposition Plan: Observe on telemetry  Consults called: Neurology Admission status: Observation    Christia Reading  Criss Rosales, MD [facility] Pager [phone]  If 7PM-7AM, please contact night-coverage [email or web address] Password [email or web address]  05/11/2016, 6:50 PM

## 2016-05-11 NOTE — ED Provider Notes (Signed)
[facility] DEPT Provider Note   CSN: [id number] Arrival date & time: 05/11/16  [id number]     History   Chief Complaint Chief Complaint  Patient presents with  . Stroke Symptoms    HPI [name] is a 81 y.o. female.  Patient last seen normal around 9:00 last night. Woke this morning with slurred speech some drooling and flaccid left arm. Patient has been bedridden since the end of the summer. No significant leg weakness noted beyond her baseline. Patient has a history of dementia. Patient does not have a local primary care doctor. Originally from the [facility] area. Patient's symptoms were present upon awakening this morning.      Past Medical History:  Diagnosis Date  . Dementia   . Diabetes mellitus type II 03/21/2011  . DVT (deep venous thrombosis) ([place]) 03/21/2011  . Gallstones   . Hyperlipidemia 03/21/2011  . Hypertension 03/21/2011  . Lymphedema of leg 03/21/2011  . Pulmonary embolism ([place]) 03/21/2011    Patient Active Problem List   Diagnosis Date Noted  . Pulmonary embolism ([place]) 03/21/2011  . DVT (deep venous thrombosis) ([place]) 03/21/2011  . Diabetes mellitus type II 03/21/2011  . Lymphedema of leg 03/21/2011  . Hyperlipidemia 03/21/2011  . Hypertension 03/21/2011    Past Surgical History:  Procedure Laterality Date  . VASCULAR SURGERY      OB History    No data available       Home Medications    Prior to Admission medications   Medication Sig Start Date End Date Taking? Authorizing Provider  atorvastatin (LIPITOR) 20 MG tablet Take 20 mg by mouth daily at 6 PM.    Yes Historical Provider, MD  cloNIDine (CATAPRES) 0.1 MG tablet Take 0.1 mg by mouth 2 (two) times daily.   Yes Historical Provider, MD  Dexlansoprazole (DEXILANT) 30 MG capsule Take 30 mg by mouth daily.   Yes Historical Provider, MD  gabapentin (NEURONTIN) 300 MG capsule Take 300 mg by mouth 3 (three) times daily.   Yes Historical Provider, MD  metFORMIN  (GLUCOPHAGE) 1000 MG tablet Take 1,000 mg by mouth 2 (two) times daily with a meal.   Yes Historical Provider, MD  Rivastigmine (EXELON) 13.3 MG/24HR PT24 Place 1 patch onto the skin daily.   Yes Historical Provider, MD  warfarin (COUMADIN) 2 MG tablet Take 2 mg by mouth daily at 6 PM.   Yes Historical Provider, MD    Family History History reviewed. No pertinent family history.  Social History Social History  Substance Use Topics  . Smoking status: Never Smoker  . Smokeless tobacco: Never Used  . Alcohol use No     Allergies   Patient has no known allergies.   Review of Systems Review of Systems  Unable to perform ROS: Dementia     Physical Exam Updated Vital Signs BP 179/89   Pulse 72   Temp 98 F (36.7 C) (Oral)   Resp 15   Wt 54.4 kg   SpO2 99%   BMI 19.37 kg/m   Physical Exam  Constitutional: She is oriented to person, place, and time. She appears well-developed and well-nourished. No distress.  HENT:  Head: Normocephalic and atraumatic.  Mucous membranes slightly dry.  Eyes: Conjunctivae and EOM are normal. Pupils are equal, round, and reactive to light.  Neck: Normal range of motion. Neck supple.  Cardiovascular: Normal rate, regular rhythm and normal heart sounds.   Pulmonary/Chest: Effort normal and breath sounds normal. No respiratory distress.  Abdominal: Soft.  Bowel sounds are normal. There is no tenderness.  Musculoskeletal: Normal range of motion. She exhibits edema.  Marked left leg swelling long-standing from lymphedema.  Neurological: She is alert and oriented to person, place, and time. No cranial nerve deficit.  Report of slurred speech at home but speech seems to be clear currently. Still with some left upper extremity weakness compared to right. But not flaccid. As it was reported earlier.  Skin: Skin is warm and dry.  Nursing note and vitals reviewed.    ED Treatments / Results  Labs (all labs ordered are listed, but only abnormal  results are displayed) Labs Reviewed  CBC - Abnormal; Notable for the following:       Result Value   Hemoglobin 11.2 (*)    HCT 35.7 (*)    All other components within normal limits  COMPREHENSIVE METABOLIC PANEL - Abnormal; Notable for the following:    Chloride 99 (*)    Glucose, Bld 171 (*)    Albumin 3.3 (*)    ALT 10 (*)    All other components within normal limits  CBG MONITORING, ED - Abnormal; Notable for the following:    Glucose-Capillary 192 (*)    All other components within normal limits  I-STAT CHEM 8, ED - Abnormal; Notable for the following:    Chloride 98 (*)    Glucose, Bld 177 (*)    Hemoglobin 11.9 (*)    HCT 35.0 (*)    All other components within normal limits  PROTIME-INR  APTT  DIFFERENTIAL  I-STAT TROPOININ, ED    EKG  EKG Interpretation  Date/Time:  Wednesday May 11 2016 16:15:26 EST Ventricular Rate:  79 PR Interval:  112 QRS Duration: 64 QT Interval:  374 QTC Calculation: 428 R Axis:   66 Text Interpretation:  Sinus rhythm with Premature supraventricular complexes Otherwise normal ECG Interpretation limited secondary to artifact No significant change since last tracing Confirmed by Teo Moede  MD, Nuh Lipton [phone]) on 05/11/2016 5:16:59 PM       Radiology Ct Head Wo Contrast  Result Date: 05/11/2016 CLINICAL DATA:  Left-sided weakness with drooling and slurred speech. EXAM: CT HEAD WITHOUT CONTRAST TECHNIQUE: Contiguous axial images were obtained from the base of the skull through the vertex without intravenous contrast. COMPARISON:  None. FINDINGS: Brain: Moderate degree of periventricular and subcortical white matter hypodensity consistent with small vessel ischemic disease likely chronic. Moderate ventriculomegaly consistent with central atrophy. Chronic left cerebellar infarct. No acute intracranial hemorrhage, midline shift or edema. No extra-axial fluid collection. No effacement of the basal cisterns or fourth ventricle. No acute large  vascular territory Vascular: No hyperdense vessels. Calcifications of both carotid siphons. Skull: Nonacute Sinuses/Orbits: Symmetric appearing orbits. Clear mastoids. The visualized paranasal sinuses are unremarkable. Other: None IMPRESSION: Moderate degree of periventricular and subcortical white matter hypodensity likely reflecting chronic small vessel ischemic disease. Bilateral carotid siphon calcifications. Central atrophy. No acute intracranial hemorrhage, midline shift nor large vascular territory infarction. Chronic left cerebellar infarct. Electronically Signed   By: Ashley Royalty M.D.   On: 05/11/2016 16:56    Procedures Procedures (including critical care time)  CRITICAL CARE Performed by: Fredia Sorrow Total critical care time: 30 minutes Critical care time was exclusive of separately billable procedures and treating other patients. Critical care was necessary to treat or prevent imminent or life-threatening deterioration. Critical care was time spent personally by me on the following activities: development of treatment plan with patient and/or surrogate as well as nursing, discussions with consultants, evaluation  of patient's response to treatment, examination of patient, obtaining history from patient or surrogate, ordering and performing treatments and interventions, ordering and review of laboratory studies, ordering and review of radiographic studies, pulse oximetry and re-evaluation of patient's condition.   Medications Ordered in ED Medications - No data to display   Initial Impression / Assessment and Plan / ED Course  I have reviewed the triage vital signs and the nursing notes.  Pertinent labs & imaging results that were available during my care of the patient were reviewed by me and considered in my medical decision making (see chart for details).  Clinical Course     Patient clinically based on the history with the left arm being flaccid now showing some  improvement consistent with CVA. Patient had evidence of old stroke in the past on head CT nothing acute. Based on history patient showing signs of improvement. Speech now is currently normal. She does have a history of dementia. Discussed with the neural hospitalist who will consult. Patient will require admission.  Final Clinical Impressions(s) / ED Diagnoses   Final diagnoses:  Cerebrovascular accident (CVA), unspecified mechanism ([place])    New Prescriptions New Prescriptions   No medications on file     Fredia Sorrow, MD 05/11/16 1754

## 2016-05-11 NOTE — Progress Notes (Signed)
Pt arrived around 2130 alert and oriented to self and situation but not time or place, daughter in law is with her has generalized weakness and reported new left side  dysfunction that appears to have resolved as per daughter in law. Will continue to monitor.

## 2016-05-11 NOTE — Consult Note (Signed)
NEURO HOSPITALIST CONSULT NOTE   Requestig physician: Dr. Myna Hidalgo  Reason for Consult: New onset left sided weakness  History obtained from:  Family and Chart     HPI:                                                                                                                                          [name] is an 81 y.o. female who presented to the ED with left arm weakness, left facial droop, slurred speech and drooling, which was witnessed by family today. She was LKN at about 9 PM last night. Symptoms were present on awakening. The symptoms have waxed and waned. There was no facial droop or dysarthria noted by ED staff at time of their initial evaluation, but still with some LUE drift.   At time of Neurology evaluation, her weakness has improved significantly. At baseline she requires family help with ADLs including food preparation. She uses a wheelchair at home and has last was able to use a wheelchair about 8 months ago.   Her PMHx includes dementia, DM2, DVT, PE ("blood clot on the lung 20 years ago"), HLD, HTN and left lower extremity lymphedema secondary to ankle fracture.   Home medications include Coumadin, atorvastatin, Neurontin and rivastigmine.     Past Medical History:  Diagnosis Date  . Dementia   . Diabetes mellitus type II 03/21/2011  . DVT (deep venous thrombosis) ([place]) 03/21/2011  . Gallstones   . Hyperlipidemia 03/21/2011  . Hypertension 03/21/2011  . Lymphedema of leg 03/21/2011  . Pulmonary embolism ([place]) 03/21/2011    Past Surgical History:  Procedure Laterality Date  . VASCULAR SURGERY      History reviewed. No pertinent family history.  Social History:  reports that she has never smoked. She has never used smokeless tobacco. She reports that she does not drink alcohol or use drugs.  No Known Allergies  MEDICATIONS:                                                                                                                      atorvastatin (LIPITOR) 20 MG tablet Take 20 mg by mouth daily at 6 PM.  Vianne Bulls, MD Not Ordered  Orderedas:atorvastatin (LIPITOR) tablet 20 mg - 20 mg, Oral,  Daily-1800, First dose on Thu 05/12/16 at 1800 (Discontinued)  cloNIDine (CATAPRES) 0.1 MG tablet Take 0.1 mg by mouth 2 (two) times daily. Vianne Bulls, MD Not Ordered  Dexlansoprazole (DEXILANT) 30 MG capsule Take 30 mg by mouth daily. Vianne Bulls, MD Reordered  Orderedas:pantoprazole (PROTONIX) EC tablet 40 mg - 40 mg, Oral, Daily, First dose on Thu 05/12/16 at 1000  gabapentin (NEURONTIN) 300 MG capsule Take 300 mg by mouth 3 (three) times daily. Vianne Bulls, MD Reordered  Orderedas:gabapentin (NEURONTIN) capsule 300 mg - 300 mg, Oral, 3 times daily, First dose on Thu 05/12/16 at 1000  metFORMIN (GLUCOPHAGE) 1000 MG tablet Take 1,000 mg by mouth 2 (two) times daily with a meal. Vianne Bulls, MD Not Ordered  Rivastigmine (EXELON) 13.3 MG/24HR PT24 Place 1 patch onto the skin daily. Vianne Bulls, MD Reordered  Orderedas:Rivastigmine PT24 13.3 mg - 13.3 mg (1 patch), Transdermal, Daily, First dose on Thu 05/12/16 at 1000  warfarin (COUMADIN) 2 MG tablet Take 2 mg by mouth daily at 6 PM. Vianne Bulls, MD Not Ordered    ROS:                                                                                                                                       History obtained from patient's daughter. No recent fevers or chills, no headache or vision changes, no chest or abdominal pain, no limb pain. Positive for occasional diarrhea, modifiable by diet.   Blood pressure 190/98, pulse 98, temperature 98 F (36.7 C), temperature source Oral, resp. rate 16, weight 54.4 kg (120 lb), SpO2 100 %.  General Examination:                                                                                                      HEENT-  Normocephalic/atraumatic.   Lungs- No gross wheezing. Respirations unlabored.  Extremities- Mild  pitting edema dorsum right foot. Moderate edema left foot and leg distal to knee.   Neurological Examination Mental Status: Awake and alert. Oriented to self but not city, state, day of week or year. Increased latency of verbal responses. Difficulty with complex commands. Decreased recall of simple words. Repetition intact. Pleasant and cooperative.  Cranial Nerves: II: Visual fields grossly normal to bedside confrontation. Right pupil 1.5 mm, left 2.5 mm and reactive bilaterally.  III,IV, VI: ptosis not present, EOMI without nystagmus V,VII: Smile symmetric. Decreased temperature sensation to right face.  VIII: hearing intact to conversation  IX,X: no hypophonia or hoarseness XI: unremarkable XII: midline tongue extension Motor: Diffuse 4/5 weakness bilateral upper extremities.  Bilateral lower extremities with 2/5 hip flexion, possible effort dependence component; 4-/5 knee extension, plantar flexion and dorsiflexion. Mildly increased tone x 4. Decreased muscle bulk all 4 extremities.  Sensory: Fine touch intact all 4 extremities. Decreased temperature sensation RUE and RLE.  Deep Tendon Reflexes: Delayed achilles reflexes bilaterally. 1+ patellar reflexes bilaterally. 1+ brachioradialis and biceps bilaterally. Toes upgoing.  Cerebellar: Slow FNF bilaterally without dyssinergia or dysmetria Gait: Patient chronically non-ambulatory  Lab Results: Basic Metabolic Panel:  Recent Labs Lab 05/11/16 1614 05/11/16 1633  NA 138 141  K 3.8 3.7  CL 99* 98*  CO2 29  --   GLUCOSE 171* 177*  BUN 13 15  CREATININE 0.57 0.50  CALCIUM 9.3  --     Liver Function Tests:  Recent Labs Lab 05/11/16 1614  AST 17  ALT 10*  ALKPHOS 62  BILITOT 0.3  PROT 6.8  ALBUMIN 3.3*   No results for input(s): LIPASE, AMYLASE in the last 168 hours. No results for input(s): AMMONIA in the last 168 hours.  CBC:  Recent Labs Lab 05/11/16 1614 05/11/16 1633  WBC 4.4  --   NEUTROABS 2.3  --   HGB  11.2* 11.9*  HCT 35.7* 35.0*  MCV 90.6  --   PLT 223  --     Cardiac Enzymes: No results for input(s): CKTOTAL, CKMB, CKMBINDEX, TROPONINI in the last 168 hours.  Lipid Panel: No results for input(s): CHOL, TRIG, HDL, CHOLHDL, VLDL, LDLCALC in the last 168 hours.  CBG:  Recent Labs Lab 05/11/16 1621  GLUCAP 192*    Microbiology: No results found for this or any previous visit.  Coagulation Studies:  Recent Labs  05/11/16 1614  LABPROT 14.7  INR 1.14    Imaging: Ct Head Wo Contrast  Result Date: 05/11/2016 CLINICAL DATA:  Left-sided weakness with drooling and slurred speech. EXAM: CT HEAD WITHOUT CONTRAST TECHNIQUE: Contiguous axial images were obtained from the base of the skull through the vertex without intravenous contrast. COMPARISON:  None. FINDINGS: Brain: Moderate degree of periventricular and subcortical white matter hypodensity consistent with small vessel ischemic disease likely chronic. Moderate ventriculomegaly consistent with central atrophy. Chronic left cerebellar infarct. No acute intracranial hemorrhage, midline shift or edema. No extra-axial fluid collection. No effacement of the basal cisterns or fourth ventricle. No acute large vascular territory Vascular: No hyperdense vessels. Calcifications of both carotid siphons. Skull: Nonacute Sinuses/Orbits: Symmetric appearing orbits. Clear mastoids. The visualized paranasal sinuses are unremarkable. Other: None IMPRESSION: Moderate degree of periventricular and subcortical white matter hypodensity likely reflecting chronic small vessel ischemic disease. Bilateral carotid siphon calcifications. Central atrophy. No acute intracranial hemorrhage, midline shift nor large vascular territory infarction. Chronic left cerebellar infarct. Electronically Signed   By: Ashley Royalty M.D.   On: 05/11/2016 16:56    Assessment: 81 year old female with new onset of left facial droop, dysarthria and LUE weakness. 1. Above described  deficits resolved at time of Neurological exam, consistent with TIA. Symptoms referable to right MCA territory.  2. CT head shows no acute findings. Noted are moderate chronic small vessel ischemic changes, central atrophy and chronic left cerebellar infarct. 3. Stroke risk factors include age, DM2, HLD and HTN.  4. History of right lower extremity DVT and remote PE, on lifelong Coumadin. INR subtherapeutic today at 1.14. 5. Chronic LLE lymphedema.   Recommendations: 1. Admit for stroke/TIA work up. Neurology  will follow in consultation.  2. Permissive HTN x 24 hours.  3. Continue Coumadin and titrate to therapeutic INR.  4. Continue atorvastatin.  5. PT/OT/Speech consults.  6. MRI brain, MRA of head, carotid ultrasound, TTE.   Electronically signed: Dr. Kerney Elbe 05/11/2016, 7:37 PM

## 2016-05-12 ENCOUNTER — Observation Stay (HOSPITAL_COMMUNITY): Payer: Medicare Other

## 2016-05-12 ENCOUNTER — Observation Stay (HOSPITAL_BASED_OUTPATIENT_CLINIC_OR_DEPARTMENT_OTHER): Payer: Medicare Other

## 2016-05-12 ENCOUNTER — Encounter (HOSPITAL_COMMUNITY): Payer: Self-pay | Admitting: *Deleted

## 2016-05-12 DIAGNOSIS — G8194 Hemiplegia, unspecified affecting left nondominant side: Secondary | ICD-10-CM | POA: Diagnosis present

## 2016-05-12 DIAGNOSIS — R0602 Shortness of breath: Secondary | ICD-10-CM

## 2016-05-12 DIAGNOSIS — D649 Anemia, unspecified: Secondary | ICD-10-CM | POA: Diagnosis present

## 2016-05-12 DIAGNOSIS — R42 Dizziness and giddiness: Secondary | ICD-10-CM

## 2016-05-12 DIAGNOSIS — R402413 Glasgow coma scale score 13-15, at hospital admission: Secondary | ICD-10-CM | POA: Diagnosis present

## 2016-05-12 DIAGNOSIS — R627 Adult failure to thrive: Secondary | ICD-10-CM | POA: Diagnosis present

## 2016-05-12 DIAGNOSIS — R4781 Slurred speech: Secondary | ICD-10-CM | POA: Diagnosis not present

## 2016-05-12 DIAGNOSIS — E785 Hyperlipidemia, unspecified: Secondary | ICD-10-CM | POA: Diagnosis present

## 2016-05-12 DIAGNOSIS — Z86711 Personal history of pulmonary embolism: Secondary | ICD-10-CM | POA: Diagnosis not present

## 2016-05-12 DIAGNOSIS — Z86718 Personal history of other venous thrombosis and embolism: Secondary | ICD-10-CM | POA: Diagnosis not present

## 2016-05-12 DIAGNOSIS — R29810 Facial weakness: Secondary | ICD-10-CM | POA: Diagnosis present

## 2016-05-12 DIAGNOSIS — D6859 Other primary thrombophilia: Secondary | ICD-10-CM | POA: Diagnosis present

## 2016-05-12 DIAGNOSIS — I639 Cerebral infarction, unspecified: Secondary | ICD-10-CM

## 2016-05-12 DIAGNOSIS — I10 Essential (primary) hypertension: Secondary | ICD-10-CM | POA: Diagnosis not present

## 2016-05-12 DIAGNOSIS — I6789 Other cerebrovascular disease: Secondary | ICD-10-CM | POA: Diagnosis not present

## 2016-05-12 DIAGNOSIS — R531 Weakness: Secondary | ICD-10-CM | POA: Diagnosis not present

## 2016-05-12 DIAGNOSIS — Z7401 Bed confinement status: Secondary | ICD-10-CM | POA: Diagnosis not present

## 2016-05-12 DIAGNOSIS — Z7901 Long term (current) use of anticoagulants: Secondary | ICD-10-CM | POA: Diagnosis not present

## 2016-05-12 DIAGNOSIS — I890 Lymphedema, not elsewhere classified: Secondary | ICD-10-CM | POA: Diagnosis present

## 2016-05-12 DIAGNOSIS — E119 Type 2 diabetes mellitus without complications: Secondary | ICD-10-CM | POA: Diagnosis not present

## 2016-05-12 DIAGNOSIS — Z7984 Long term (current) use of oral hypoglycemic drugs: Secondary | ICD-10-CM | POA: Diagnosis not present

## 2016-05-12 DIAGNOSIS — G459 Transient cerebral ischemic attack, unspecified: Secondary | ICD-10-CM | POA: Diagnosis present

## 2016-05-12 DIAGNOSIS — I071 Rheumatic tricuspid insufficiency: Secondary | ICD-10-CM | POA: Diagnosis present

## 2016-05-12 DIAGNOSIS — R471 Dysarthria and anarthria: Secondary | ICD-10-CM | POA: Diagnosis present

## 2016-05-12 DIAGNOSIS — F0390 Unspecified dementia without behavioral disturbance: Secondary | ICD-10-CM | POA: Diagnosis present

## 2016-05-12 DIAGNOSIS — Z79899 Other long term (current) drug therapy: Secondary | ICD-10-CM | POA: Diagnosis not present

## 2016-05-12 LAB — GLUCOSE, CAPILLARY
GLUCOSE-CAPILLARY: 72 mg/dL (ref 65–99)
GLUCOSE-CAPILLARY: 107 mg/dL — AB (ref 65–99)
Glucose-Capillary: 136 mg/dL — ABNORMAL HIGH (ref 65–99)
Glucose-Capillary: 206 mg/dL — ABNORMAL HIGH (ref 65–99)

## 2016-05-12 LAB — TSH: TSH: 6.336 u[IU]/mL — ABNORMAL HIGH (ref 0.350–4.500)

## 2016-05-12 LAB — LIPID PANEL
CHOL/HDL RATIO: 3.4 ratio
Cholesterol: 148 mg/dL (ref 0–200)
HDL: 43 mg/dL (ref >40)
LDL Cholesterol: 96 mg/dL (ref 0–99)
Triglycerides: 46 mg/dL (ref <150)
VLDL: 9 mg/dL (ref 0–40)

## 2016-05-12 LAB — ECHOCARDIOGRAM COMPLETE
Height: 65 in
Weight: 1763.2 oz

## 2016-05-12 LAB — VAS US CAROTID
LCCADDIAS: 13 cm/s
LCCADSYS: 65 cm/s
LCCAPDIAS: 10 cm/s
LCCAPSYS: 81 cm/s
LEFT ECA DIAS: -11 cm/s
LEFT VERTEBRAL DIAS: -8 cm/s
LICADSYS: -63 cm/s
LICAPSYS: -43 cm/s
Left ICA dist dias: -17 cm/s
Left ICA prox dias: -12 cm/s
RCCAPSYS: 57 cm/s
RIGHT VERTEBRAL DIAS: 7 cm/s
Right CCA prox dias: 13 cm/s
Right cca dist sys: -76 cm/s

## 2016-05-12 LAB — T4, FREE: FREE T4: 1.2 ng/dL — AB (ref 0.61–1.12)

## 2016-05-12 LAB — PROTIME-INR
INR: 1.16
PROTHROMBIN TIME: 14.8 s (ref 11.4–15.2)

## 2016-05-12 LAB — VITAMIN B12: VITAMIN B 12: 301 pg/mL (ref 180–914)

## 2016-05-12 MED ORDER — LORAZEPAM 2 MG/ML IJ SOLN
1.0000 mg | Freq: Once | INTRAMUSCULAR | Status: AC
  Administered 2016-05-12: 1 mg via INTRAVENOUS

## 2016-05-12 MED ORDER — METFORMIN HCL 500 MG PO TABS
1000.0000 mg | ORAL_TABLET | Freq: Two times a day (BID) | ORAL | Status: DC
  Administered 2016-05-13 – 2016-05-15 (×5): 1000 mg via ORAL
  Filled 2016-05-12 (×5): qty 2

## 2016-05-12 MED ORDER — WARFARIN - PHARMACIST DOSING INPATIENT
Freq: Every day | Status: DC
  Administered 2016-05-12 – 2016-05-14 (×2)

## 2016-05-12 MED ORDER — LORAZEPAM BOLUS VIA INFUSION
1.0000 mg | Freq: Once | INTRAVENOUS | Status: DC
  Filled 2016-05-12: qty 1

## 2016-05-12 MED ORDER — LORAZEPAM 2 MG/ML IJ SOLN
INTRAMUSCULAR | Status: AC
  Filled 2016-05-12: qty 1

## 2016-05-12 MED ORDER — CLONIDINE HCL 0.1 MG PO TABS
0.1000 mg | ORAL_TABLET | Freq: Three times a day (TID) | ORAL | Status: DC
  Administered 2016-05-13 – 2016-05-15 (×8): 0.1 mg via ORAL
  Filled 2016-05-12 (×8): qty 1

## 2016-05-12 MED ORDER — INSULIN ASPART 100 UNIT/ML ~~LOC~~ SOLN
0.0000 [IU] | Freq: Every day | SUBCUTANEOUS | Status: DC
  Administered 2016-05-12: 2 [IU] via SUBCUTANEOUS

## 2016-05-12 MED ORDER — WARFARIN SODIUM 4 MG PO TABS
4.0000 mg | ORAL_TABLET | Freq: Once | ORAL | Status: AC
  Administered 2016-05-12: 4 mg via ORAL
  Filled 2016-05-12: qty 1

## 2016-05-12 MED ORDER — WARFARIN SODIUM 4 MG PO TABS
4.0000 mg | ORAL_TABLET | ORAL | Status: AC
  Administered 2016-05-12: 4 mg via ORAL
  Filled 2016-05-12: qty 1

## 2016-05-12 MED ORDER — INSULIN ASPART 100 UNIT/ML ~~LOC~~ SOLN
0.0000 [IU] | Freq: Three times a day (TID) | SUBCUTANEOUS | Status: DC
  Administered 2016-05-12 – 2016-05-14 (×2): 1 [IU] via SUBCUTANEOUS

## 2016-05-12 NOTE — Evaluation (Signed)
Occupational Therapy Evaluation/Discharge Patient Details Name: [name] MRN: [id number] DOB: [birth year] Today's Date: 05/12/2016    History of Present Illness Pt is a 81 y.o. female whoe presented to the ED with L arm weakness, L facial droope, slurred speech, and drooling that family witnessed on 05/11/16. These symptoms have waxed and waned and have improved greatly. She has a PMH significant for diabetes mellitus, hypertension, hyperlipidemia, lymphedema of L leg, history of remote DVT and PE, and dementia.   Clinical Impression   PTA, pt required max assist with all ADL and functional mobility but was able to assist with grooming and feeding tasks. Pt has excellent family support and has a caregiver during the day that is able to provide all necessary assistance with ADL and IADL. Feel pt is near baseline with ADL participation and would not benefit from continued OT services at this time. No further acute OT needs identified. Will sign off.    Follow Up Recommendations  No OT follow up;Supervision/Assistance - 24 hour    Equipment Recommendations  None recommended by OT    Recommendations for Other Services       Precautions / Restrictions Precautions Precautions: Fall Restrictions Weight Bearing Restrictions: No      Mobility Bed Mobility Overal bed mobility: Needs Assistance Bed Mobility: Rolling;Supine to Sit;Sit to Supine Rolling: Total assist;+2 for physical assistance   Supine to sit: Total assist;+2 for physical assistance Sit to supine: +2 for physical assistance;Total assist      Transfers Overall transfer level: Needs assistance   Transfers: Sit to/from Stand;Stand Pivot Transfers Sit to Stand: Total assist;+2 physical assistance Stand pivot transfers: Total assist;+2 physical assistance            Balance Overall balance assessment: Needs assistance Sitting-balance support: Bilateral upper extremity supported;Feet supported Sitting balance-Leahy  Scale: Poor     Standing balance support: During functional activity Standing balance-Leahy Scale: Zero                              ADL Overall ADL's : At baseline                                       General ADL Comments: Pt requires max to total assist with ADL at baseline. Remains able to drink from cup and feed self.     Vision Vision Assessment?: No apparent visual deficits   Perception     Praxis      Pertinent Vitals/Pain Pain Assessment: No/denies pain     Hand Dominance Right   Extremity/Trunk Assessment Upper Extremity Assessment Upper Extremity Assessment: Generalized weakness;LUE deficits/detail LUE Deficits / Details: slightly decreased grasp strength as compared with RUE.   Lower Extremity Assessment Lower Extremity Assessment: Generalized weakness       Communication     Cognition Arousal/Alertness: Awake/alert;Lethargic Behavior During Therapy: WFL for tasks assessed/performed Overall Cognitive Status: History of cognitive impairments - at baseline                 General Comments: At baseline pt with dementia   General Comments       Exercises       Shoulder Instructions      Home Living Family/patient expects to be discharged to:: Private residence Living Arrangements: Children Available Help at Discharge: Family;Personal care attendant;Available 24 hours/day Type of Home: House  Home Layout: One level     Bathroom Shower/Tub: Tub/shower unit (Does bed baths) Shower/tub characteristics: Curtain   Bathroom Accessibility: Yes How Accessible: Accessible via wheelchair Home Equipment: Wheelchair - manual;Walker - 4 wheels          Prior Functioning/Environment Level of Independence: Needs assistance  Gait / Transfers Assistance Needed: Uses w/c and max assist for transfers. ADL's / Homemaking Assistance Needed: Able to feed herself and perform grooming tasks. Max assist for all other  ADL and total assis IADL.            OT Problem List: Decreased strength;Decreased range of motion;Decreased activity tolerance;Impaired balance (sitting and/or standing);Decreased safety awareness;Decreased knowledge of use of DME or AE;Decreased knowledge of precautions;Decreased cognition;Impaired UE functional use   OT Treatment/Interventions:      OT Goals(Current goals can be found in the care plan section) Acute Rehab OT Goals Patient Stated Goal: did not state OT Goal Formulation: With patient/family Time For Goal Achievement: 05/19/16 Potential to Achieve Goals: Fair  OT Frequency:     Barriers to D/C:            Co-evaluation              End of Session Equipment Utilized During Treatment: Gait belt  Activity Tolerance: Patient tolerated treatment well Patient left: in bed;with call bell/phone within reach;with family/visitor present;Other (comment) (With transport)   Time: [id number] OT Time Calculation (min): 45 min Charges:  OT General Charges $OT Visit: 1 Procedure OT Evaluation $OT Eval Moderate Complexity: 1 Procedure OT Treatments $Self Care/Home Management : 23-37 mins  Norman Herrlich, OTR/L [phone] 05/12/2016, 10:19 AM

## 2016-05-12 NOTE — Progress Notes (Signed)
  Echocardiogram 2D Echocardiogram has been performed.  [name] 05/12/2016, 11:08 AM

## 2016-05-12 NOTE — Evaluation (Signed)
Physical Therapy Evaluation Patient Details Name: [name] MRN: [id number] DOB: 22-Apr-1936 Today's Date: 05/12/2016   History of Present Illness  Pt is a 81 y.o. female who presented to the ED with L arm weakness, L facial droop, slurred speech, and drooling that family witnessed on 05/11/16. These symptoms have waxed and waned and have improved greatly. She has a PMH significant for diabetes mellitus, hypertension, hyperlipidemia, lymphedema of L leg, history of remote DVT and PE, and dementia. CT negative for acute changes, MRI is pending.   Clinical Impression  Pt is still feeling the effects of the sedation from her MRI.  She would likely preform a bit better when not so sleepy.  She was able to sit EOB with me and follow some simple commands once seated, but did not help much with the transitions.  I would like to see her tomorrow to see if her performance is any better without the sedative on board.  Her caregiver reports that she does normally help with transfers.   PT to follow acutely for deficits listed below.       Follow Up Recommendations Home health PT;Supervision/Assistance - 24 hour    Equipment Recommendations  None recommended by PT    Recommendations for Other Services   NA    Precautions / Restrictions Precautions Precautions: Fall Precaution Comments: weak at baseline (left greater than right)      Mobility  Bed Mobility Overal bed mobility: Needs Assistance Bed Mobility: Rolling;Supine to Sit;Sit to Supine Rolling: Total assist   Supine to sit: Total assist;HOB elevated Sit to supine: Total assist   General bed mobility comments: Pt not assisting in initiating movement to EOB and back into bed.  She is more lethargic than her baseline as she was seated for MRI.   Transfers                 General transfer comment: NT as pt was falling asleep in sitting EOB.          Balance Overall balance assessment: Needs assistance Sitting-balance  support: Feet supported;Bilateral upper extremity supported Sitting balance-Leahy Scale: Poor Sitting balance - Comments: Min to mod assist EOB, better once feet were positioned better and pt was squared up                                     Pertinent Vitals/Pain Pain Assessment: No/denies pain    Home Living Family/patient expects to be discharged to:: Private residence Living Arrangements: Children Available Help at Discharge: Family;Personal care attendant;Available 24 hours/day Type of Home: House       Home Layout: One level Home Equipment: Wheelchair - manual;Walker - 4 wheels (bed rails, but normal bed (not hospital bed))      Prior Function Level of Independence: Needs assistance   Gait / Transfers Assistance Needed: Uses w/c and max assist for transfers.  ADL's / Homemaking Assistance Needed: Able to feed herself and perform grooming tasks. Max assist for all other ADL and total assis IADL.        Hand Dominance   Dominant Hand: Right    Extremity/Trunk Assessment   Upper Extremity Assessment Upper Extremity Assessment: Defer to OT evaluation    Lower Extremity Assessment Lower Extremity Assessment: RLE deficits/detail;LLE deficits/detail RLE Deficits / Details: right leg is stronger than left leg and has better muscle tone, less edema and right knee joint looks less arthritic than  left.  Per caregiver her right side is typically her stronger side.   LLE Deficits / Details: left leg with significantly more lower leg edema, more arthritic looking knee joint (larger bony prominances and not because of increased edema on this side), pt with weaker LAQ on this side seated EOB.     Cervical / Trunk Assessment Cervical / Trunk Assessment: Kyphotic  Communication      Cognition Arousal/Alertness: Lethargic;Suspect due to medications (was sedated for MRI) Behavior During Therapy: WFL for tasks assessed/performed Overall Cognitive Status: History of  cognitive impairments - at baseline                 General Comments: At baseline pt with dementia       Exercises General Exercises - Lower Extremity Long Arc Quad: AROM;Both;5 reps;Seated   Assessment/Plan    PT Assessment Patient needs continued PT services  PT Problem List Decreased strength;Decreased range of motion;Decreased activity tolerance;Decreased balance;Decreased mobility;Decreased cognition;Decreased coordination;Decreased knowledge of use of DME;Decreased safety awareness;Impaired sensation;Impaired tone          PT Treatment Interventions DME instruction;Functional mobility training;Therapeutic activities;Therapeutic exercise;Balance training;Neuromuscular re-education;Cognitive remediation;Patient/family education    PT Goals (Current goals can be found in the Care Plan section)  Acute Rehab PT Goals Patient Stated Goal: caregiver would like for her to get back to PLOF Time For Goal Achievement: 05/26/16 Potential to Achieve Goals: Good    Frequency Min 3X/week           End of Session   Activity Tolerance: Patient limited by lethargy Patient left: in bed;with call bell/phone within reach;with family/visitor present      Functional Assessment Tool Used: assist level Functional Limitation: Mobility: Walking and moving around Mobility: Walking and Moving Around Current Status [id number]): At least 80 percent but less than 100 percent impaired, limited or restricted Mobility: Walking and Moving Around Goal Status [phone]): At least 60 percent but less than 80 percent impaired, limited or restricted    Time: 1446-1531 PT Time Calculation (min) (ACUTE ONLY): 45 min   Charges:   PT Evaluation $PT Eval Moderate Complexity: 1 Procedure PT Treatments $Therapeutic Activity: 23-37 mins   PT G Codes:   PT G-Codes **NOT FOR INPATIENT CLASS** Functional Assessment Tool Used: assist level Functional Limitation: Mobility: Walking and moving around Mobility:  Walking and Moving Around Current Status [id number]): At least 80 percent but less than 100 percent impaired, limited or restricted Mobility: Walking and Moving Around Goal Status [phone]): At least 60 percent but less than 80 percent impaired, limited or restricted    Mikaeel Petrow B. Austine Kelsay, PT, DPT [phone]   05/12/2016, 3:42 PM

## 2016-05-12 NOTE — Progress Notes (Signed)
STROKE TEAM PROGRESS NOTE   HISTORY OF PRESENT ILLNESS (per record) [name] is an 81 y.o. female who presented to the ED with left arm weakness, left facial droop, slurred speech and drooling, which was witnessed by family today. She was LKW at about 9 PM last night 05/10/2016. Symptoms were present on awakening. The symptoms have waxed and waned. There was no facial droop or dysarthria noted by ED staff at time of their initial evaluation, but still with some LUE drift.   At time of Neurology evaluation, her weakness has improved significantly. At baseline she requires family help with ADLs including food preparation. She uses a wheelchair at home and has last was able to use a wheelchair about 8 months ago.   Her PMHx includes dementia, DM2, DVT, PE ("blood clot on the lung 20 years ago"), HLD, HTN and left lower extremity lymphedema secondary to ankle fracture.   Home medications include Coumadin, atorvastatin, Neurontin and rivastigmine.    Patient was not administered IV t-PA. She was admitted for further evaluation and treatment.   SUBJECTIVE (INTERVAL HISTORY) [name] at the bedside who is the caregiver. She stated that pt is on coumadin at home but not checking INR regularly. She was referred by PCP to [name] for further management of anticoagulation. However, pt has not seen [name] yet.    OBJECTIVE Temp:  [97.8 F (36.6 C)-98.7 F (37.1 C)] 97.8 F (36.6 C) (01/04 0530) Pulse Rate:  [59-98] 77 (01/04 0930) Cardiac Rhythm: Normal sinus rhythm (01/04 0700) Resp:  [13-18] 16 (01/04 0730) BP: (160-207)/(67-98) 176/93 (01/04 0930) SpO2:  [97 %-100 %] 100 % (01/04 0930) Weight:  [50 kg (110 lb 3.2 oz)-54.4 kg (120 lb)] 50 kg (110 lb 3.2 oz) (01/03 2130)  CBC:  Recent Labs Lab 05/11/16 1614 05/11/16 1633  WBC 4.4  --   NEUTROABS 2.3  --   HGB 11.2* 11.9*  HCT 35.7* 35.0*  MCV 90.6  --   PLT 223  --     Basic Metabolic Panel:  Recent Labs Lab  05/11/16 1614 05/11/16 1633  NA 138 141  K 3.8 3.7  CL 99* 98*  CO2 29  --   GLUCOSE 171* 177*  BUN 13 15  CREATININE 0.57 0.50  CALCIUM 9.3  --     Lipid Panel:    Component Value Date/Time   CHOL 148 05/12/2016 0723   TRIG 46 05/12/2016 0723   HDL 43 05/12/2016 0723   CHOLHDL 3.4 05/12/2016 0723   VLDL 9 05/12/2016 0723   LDLCALC 96 05/12/2016 0723   HgbA1c: No results found for: HGBA1C Urine Drug Screen: No results found for: LABOPIA, COCAINSCRNUR, LABBENZ, AMPHETMU, THCU, LABBARB    IMAGING I have personally reviewed the radiological images below and agree with the radiology interpretations.  Dg Chest 2 View 05/11/2016 No acute disease. Atherosclerosis.   Ct Head Wo Contrast 05/11/2016 Moderate degree of periventricular and subcortical white matter hypodensity likely reflecting chronic small vessel ischemic disease. Bilateral carotid siphon calcifications. Central atrophy. No acute intracranial hemorrhage, midline shift nor large vascular territory infarction. Chronic left cerebellar infarct.   2-D echo - Left ventricle: The cavity size was normal. Wall thickness was increased in a pattern of moderate LVH. Systolic function was normal. The estimated ejection fraction was in the range of 60% to 65%. Wall motion was normal; there were no regional wall motion abnormalities. Doppler parameters are consistent with abnormal left ventricular relaxation (grade 1 diastolic dysfunction). The  E/e&' ratio is between 8-15, suggesting indeterminate LV filling pressure. - Aortic valve: Trileaflet. Sclerosis without stenosis. There was no regurgitation. - Mitral valve: Mildly thickened leaflets . No significant inflow variation with respiration. There was trivial regurgitation. - Left atrium: The atrium was normal in size. - Tricuspid valve: There was mild regurgitation. No significant inflow variation with respiration. - Pulmonary arteries: PA peak pressure: 41 mm Hg (S). - Inferior vena  cava: The vessel was dilated. The respirophasic diameter changes were blunted (< 50%), consistent with elevated central venous pressure. - Pericardium, extracardiac: A trivial pericardial effusion was identified posterior to the heart. Features were not consistent with tamponade physiology. Impressions: - LVEF 60-65%. moderate LVH, normal wall motion, grade 1 DD with positioning  indeterminate LV filling pressure, trivial MR, normal LA size, mild TR, RVSP 41 mmHG, dilated IVC, trivial posterior pericardial effusion without significant echocardiographic features to support tamponade physiology.  Carotid Doppler   There is 1-39% bilateral ICA stenosis. Vertebral artery flow is antegrade.    Mr Brain Wo Contrast 05/12/2016 IMPRESSION: 1. No evidence of acute infarct. 2. The examination had to be discontinued prior to completion due to patient agitation.    PHYSICAL EXAM  Temp:  [97.8 F (36.6 C)-98.9 F (37.2 C)] 98.9 F (37.2 C) (01/04 1822) Pulse Rate:  [59-77] 72 (01/04 1822) Resp:  [16-18] 16 (01/04 1822) BP: (160-199)/(67-93) 199/81 (01/04 1822) SpO2:  [97 %-100 %] 100 % (01/04 1822)  General - Well nourished, well developed, in no apparent distress, mildly lethargic.  Ophthalmologic - Fundi not visualized due to noncooperation.  Cardiovascular - Regular rate and rhythm.  Mental Status -  Awake but mildly lethargic and orientation to person were intact, but not orientated to place and time. Language exam showed paucity of speech, answer questions with short words or sentences, able to follow some simple commands but not all of them, able to name 1/3, and able to repeat but with mild dysarthria.  Cranial Nerves II - XII - II - Visual field intact OU. III, IV, VI - Extraocular movements intact. V - Facial sensation intact bilaterally. VII - Facial movement intact bilaterally. VIII - Hearing & vestibular intact bilaterally. X - Palate elevates symmetrically. XI - Chin turning &  shoulder shrug intact bilaterally. XII - Tongue protrusion intact.  Motor Strength - The patient's strength was 4/5 BUEs and 3/5 BLEs and pronator drift was absent.  Bulk was normal and fasciculations were absent.   Motor Tone - Muscle tone was assessed at the neck and appendages and was normal.  Reflexes - The patient's reflexes were 1+ in all extremities and she had no pathological reflexes.  Sensory - Light touch, temperature/pinprick were assessed and were symmetrical.    Coordination - The patient had normal movements in the hands with no ataxia or dysmetria, but slow on action.  Tremor was absent.  Gait and Station - not tested.    ASSESSMENT/PLAN [name] is a 81 y.o. female with history of dementia, DM2, DVT, PE, HLD, HTN and left lower extremity lymphedema secondary to ankle fracture presenting with L arm weakness, L facial droop, slurred speech and drooling . She did not receive IV t-PA.   TIA:  Right brain TIA secondary to ? hypercoagulable state  Resultant  Deficit resolved  MRI  No acute infarct  Carotid Doppler  No significant stenosis   2D Echo  EF 60-65%, mod LVH  LDL 96  HgbA1c pending  Warfarin for VTE prophylaxis  Diet  Carb Modified Fluid consistency: Thin; Room service appropriate? Yes  warfarin daily prior to admission, now on warfarin daily and ASA. Continue coumadin and INR goal 2-3. Once INR at goal, ASA can be discontinued.   Patient counseled to be compliant with her antithrombotic medications  Ongoing aggressive stroke risk factor management  Therapy recommendations:  No OT  Disposition:  pending   ? Hypercoagulable state  Multiple DVT or PE in the past as per caregiver  On coumadin for a long time  Has referral to [name] @ Hematology for anticoagulation management  Continue coumadin with goal INR 2-3  Hypertension  Unstable  Still on the high side  Resume home clonidine  Long-term BP goal  normotensive  Hyperlipidemia  Home meds:  lipitor 20, resumed in hospital  LDL 96, goal < 70  Continue statin at discharge  Diabetes type II  HgbA1c pending, goal < 7.0  SSI  Resume metformin  Other Stroke Risk Factors  Advanced age  Other Active Problems  Baseline dementia - on exelon patch  FTT  Hospital day # 0  Neurology will sign off. Please call with questions. No neuro follow up needed at this time. Thanks for the consult.   To contact Stroke Continuity provider, please refer to [email or web address] After hours, contact General Neurology

## 2016-05-12 NOTE — Progress Notes (Signed)
ANTICOAGULATION CONSULT NOTE - Initial Consult  Pharmacy Consult for Coumadin Indication: h/o PE/DVT  No Known Allergies  Patient Measurements: Height: 5\' 5"  (165.1 cm) Weight: 110 lb 3.2 oz (50 kg) IBW/kg (Calculated) : 57  Vital Signs: Temp: 98.7 F (37.1 C) (01/03 2130) Temp Source: Oral (01/03 2330) BP: 163/79 (01/03 2330) Pulse Rate: 66 (01/03 2330)  Labs:  Recent Labs  05/11/16 1614 05/11/16 1633  HGB 11.2* 11.9*  HCT 35.7* 35.0*  PLT 223  --   APTT 32  --   LABPROT 14.7  --   INR 1.14  --   CREATININE 0.57 0.50    Estimated Creatinine Clearance: 45.7 mL/min (by C-G formula based on SCr of 0.5 mg/dL).   Medical History: Past Medical History:  Diagnosis Date  . Dementia   . Diabetes mellitus type II 03/21/2011  . DVT (deep venous thrombosis) ([place]) 03/21/2011  . Gallstones   . Hyperlipidemia 03/21/2011  . Hypertension 03/21/2011  . Lymphedema of leg 03/21/2011  . Pulmonary embolism ([place]) 03/21/2011    Medications:  Prescriptions Prior to Admission  Medication Sig Dispense Refill Last Dose  . atorvastatin (LIPITOR) 20 MG tablet Take 20 mg by mouth daily at 6 PM.    05/10/2016 at Unknown time  . cloNIDine (CATAPRES) 0.1 MG tablet Take 0.1 mg by mouth 2 (two) times daily.   05/11/2016 at Unknown time  . Dexlansoprazole (DEXILANT) 30 MG capsule Take 30 mg by mouth daily.   05/11/2016 at Unknown time  . gabapentin (NEURONTIN) 300 MG capsule Take 300 mg by mouth 3 (three) times daily.   05/11/2016 at Unknown time  . metFORMIN (GLUCOPHAGE) 1000 MG tablet Take 1,000 mg by mouth 2 (two) times daily with a meal.   05/11/2016 at Unknown time  . Rivastigmine (EXELON) 13.3 MG/24HR PT24 Place 1 patch onto the skin daily.   05/11/2016 at Unknown time  . warfarin (COUMADIN) 2 MG tablet Take 2 mg by mouth daily at 6 PM.   05/10/2016 at 1800   Scheduled:  . aspirin  300 mg Rectal Daily   Or  . aspirin  325 mg Oral Daily  . atorvastatin  40 mg Oral q1800  . enoxaparin (LOVENOX)  injection  40 mg Subcutaneous Q24H  . gabapentin  300 mg Oral TID  . insulin aspart  0-9 Units Subcutaneous Q4H  . LORazepam  0.5 mg Intravenous Once  . pantoprazole  40 mg Oral Daily  . rivastigmine  13.8 mg Transdermal Daily  . warfarin  4 mg Oral NOW  . Warfarin - Pharmacist Dosing Inpatient   Does not apply q1800   Infusions:  . sodium chloride 1,000 mL (05/11/16 2224)    Assessment: 81yo female presents w/ new-onset left-sided weakness c/w TIA, to continue Coumadin for h/o PE/DVT, INR below goal, put on low-dose LMWH until INR therapeutic; last dose of Coumadin taken 1/2.  Goal of Therapy:  INR 2-3   Plan:  Will give Coumadin 4mg  po x1 now and monitor INR for dose adjustments.  Wynona Neat, PharmD, BCPS  05/12/2016,1:14 AM

## 2016-05-12 NOTE — Progress Notes (Signed)
PROGRESS NOTE    [name]  [id number] DOB: 1935/12/05 DOA: 05/11/2016 PCP: Corinna Lines, MD   Outpatient Specialists:     Brief Narrative:  [name] is a 81 y.o. female with medical history significant for non-insulin-dependent diabetes mellitus, hypertension, hyperlipidemia, lymphedema of the left leg, and history of remote DVT and PE on Coumadin who presents to the emergency department for evaluation of left arm weakness with left facial droop, drooling, and slurred speech. Patient was seen in her usual state last night and seemed to be normal in bed this morning when her daughter-in-law saw her. Later in the morning, a home health aide noted the patient's left arm to be flaccid with a left facial droop, drooling, and slurred speech. The daughter-in-law are returned to the home, initially finding the patient back to her usual state, but then noting a recurrence in the left arm weakness and dysarthria. Patient did not have any complaints at the time and there had been no recent illness reported. There is been no recent fall or trauma, no alcohol or illicit substance use, and no recent changes to her medications. Patient is managed with Coumadin for a history of remote DVT and PE, but her daughter-in-law notes that INR has been only slightly above 1 on consecutive recent measurements, but her clinician did not want to increase her dose secondary to fall risk.     Assessment & Plan:   Principal Problem:   Left-sided weakness Active Problems:   History of pulmonary embolism   History of DVT (deep vein thrombosis)   Diabetes mellitus type II, non insulin dependent (HCC)   Hyperlipidemia   Hypertension   Dysarthria   Acute left-sided weakness   Acute CVA (cerebrovascular accident) ([place])   Left-sided weakness, dysarthria - Head CT negative for acute intracranial abnormality - Neurology consult - No tPA given resolved sxs and unclear LKW   - PT/OT/SLP evals are  requested  - have been unable to get MRI/MRA - carotid US -TTE -A1c -fasting lipids  -  increase warfarin to therapeutic levels-- lovenox until INR >2 - Permissive HTN to 210/110 in acute phase  Hx of DVT and PE - Pt has remote hx of DVT and PE and has been managed with coumadin  - INR has been just over 1 on consecutive recent measurements, but per family, dose was not increased d/t concerns regarding falls  - She has been bedbound since last summer and may no longer present a fall-risk  - Neurology has advised increasing coumadin to therapeutic levels; will continue prophylactic sq Lovenox for now as INR only 1.14   Type II DM  - No A1c on file  - Managed with metformin only at home; this will be held  - SSI - A1c pending  Hypertension  - BP markedly elevated in ED - She is managed with clonidine at home; this is held on admission  - Plan to permit hypertension to 210/110 in acute phase of suspected TIA/CVA; labetalol IVPs available prn    Hyperlipidemia  - Continue Lipitor, increased to 40 mg qHS  - Fasting lipid panel pending   Generalized weakness, failure to thrive - Pt has been bedbound since last summer and moved here from [place] at that time so that local children could help care for her  - Unclear etiology for this; will check thyroid studies: 6.336-- add on T4, B12 301 , folate levels pending; MRI brain pending      DVT prophylaxis:  Lovenox until  INR >2  Code Status: Full Code   Family Communication: At bedside  Disposition Plan:  Home once work up complete   Consultants:   neuro    Subjective: Asking to eat, says she is hungry  Objective: Vitals:   05/12/16 0400 05/12/16 0530 05/12/16 0730 05/12/16 0930  BP: (!) 181/72 (!) 180/74 (!) 173/80 (!) 176/93  Pulse: (!) 59 73 75 77  Resp: 18 18 16    Temp:  97.8 F (36.6 C)    TempSrc:  Axillary    SpO2: 97% 100% 98% 100%  Weight:      Height:        Intake/Output Summary (Last  24 hours) at 05/12/16 1239 Last data filed at 05/12/16 0600  Gross per 24 hour  Intake              570 ml  Output                0 ml  Net              570 ml   Filed Weights   05/11/16 1609 05/11/16 2130  Weight: 54.4 kg (120 lb) 50 kg (110 lb 3.2 oz)    Examination:  General exam: Appears calm and comfortable  Respiratory system: Clear to auscultation. Respiratory effort normal. Cardiovascular system: S1 & S2 heard, RRR. No JVD, murmurs, rubs, gallops or clicks. No pedal edema. Gastrointestinal system: Abdomen is nondistended, soft and nontender. No organomegaly or masses felt. Normal bowel sounds heard. Central nervous system: Alert-- pleasant  .     Data Reviewed: I have personally reviewed following labs and imaging studies  CBC:  Recent Labs Lab 05/11/16 1614 05/11/16 1633  WBC 4.4  --   NEUTROABS 2.3  --   HGB 11.2* 11.9*  HCT 35.7* 35.0*  MCV 90.6  --   PLT 223  --    Basic Metabolic Panel:  Recent Labs Lab 05/11/16 1614 05/11/16 1633  NA 138 141  K 3.8 3.7  CL 99* 98*  CO2 29  --   GLUCOSE 171* 177*  BUN 13 15  CREATININE 0.57 0.50  CALCIUM 9.3  --    GFR: Estimated Creatinine Clearance: 45.7 mL/min (by C-G formula based on SCr of 0.5 mg/dL). Liver Function Tests:  Recent Labs Lab 05/11/16 1614  AST 17  ALT 10*  ALKPHOS 62  BILITOT 0.3  PROT 6.8  ALBUMIN 3.3*   No results for input(s): LIPASE, AMYLASE in the last 168 hours. No results for input(s): AMMONIA in the last 168 hours. Coagulation Profile:  Recent Labs Lab 05/11/16 1614 05/12/16 0723  INR 1.14 1.16   Cardiac Enzymes: No results for input(s): CKTOTAL, CKMB, CKMBINDEX, TROPONINI in the last 168 hours. BNP (last 3 results) No results for input(s): PROBNP in the last 8760 hours. HbA1C: No results for input(s): HGBA1C in the last 72 hours. CBG:  Recent Labs Lab 05/11/16 1621 05/11/16 2135 05/11/16 2341 05/12/16 0531 05/12/16 1153  GLUCAP 192* 108* 155* 72 107*    Lipid Profile:  Recent Labs  05/12/16 0723  CHOL 148  HDL 43  LDLCALC 96  TRIG 46  CHOLHDL 3.4   Thyroid Function Tests:  Recent Labs  05/12/16 0723  TSH 6.336*   Anemia Panel:  Recent Labs  05/12/16 0723  VITAMINB12 301   Urine analysis: No results found for: COLORURINE, APPEARANCEUR, LABSPEC, PHURINE, GLUCOSEU, HGBUR, BILIRUBINUR, KETONESUR, PROTEINUR, UROBILINOGEN, NITRITE, LEUKOCYTESUR   )No results found for this or any  previous visit (from the past 240 hour(s)).    Anti-infectives    None       Radiology Studies: Dg Chest 2 View  Result Date: 05/11/2016 CLINICAL DATA:  Left-sided weakness and slurred speech beginning today. EXAM: CHEST  2 VIEW COMPARISON:  Single-view of the chest 03/08/2016. FINDINGS: The lungs are clear. Heart size is upper normal. No pneumothorax or pleural effusion. Atherosclerosis noted. Thoracolumbar scoliosis is also seen. IMPRESSION: No acute disease. Atherosclerosis. Electronically Signed   By: Inge Rise M.D.   On: 05/11/2016 20:00   Ct Head Wo Contrast  Result Date: 05/11/2016 CLINICAL DATA:  Left-sided weakness with drooling and slurred speech. EXAM: CT HEAD WITHOUT CONTRAST TECHNIQUE: Contiguous axial images were obtained from the base of the skull through the vertex without intravenous contrast. COMPARISON:  None. FINDINGS: Brain: Moderate degree of periventricular and subcortical white matter hypodensity consistent with small vessel ischemic disease likely chronic. Moderate ventriculomegaly consistent with central atrophy. Chronic left cerebellar infarct. No acute intracranial hemorrhage, midline shift or edema. No extra-axial fluid collection. No effacement of the basal cisterns or fourth ventricle. No acute large vascular territory Vascular: No hyperdense vessels. Calcifications of both carotid siphons. Skull: Nonacute Sinuses/Orbits: Symmetric appearing orbits. Clear mastoids. The visualized paranasal sinuses are  unremarkable. Other: None IMPRESSION: Moderate degree of periventricular and subcortical white matter hypodensity likely reflecting chronic small vessel ischemic disease. Bilateral carotid siphon calcifications. Central atrophy. No acute intracranial hemorrhage, midline shift nor large vascular territory infarction. Chronic left cerebellar infarct. Electronically Signed   By: Ashley Royalty M.D.   On: 05/11/2016 16:56        Scheduled Meds: . aspirin  300 mg Rectal Daily   Or  . aspirin  325 mg Oral Daily  . atorvastatin  40 mg Oral q1800  . enoxaparin (LOVENOX) injection  40 mg Subcutaneous Q24H  . gabapentin  300 mg Oral TID  . insulin aspart  0-9 Units Subcutaneous Q4H  . pantoprazole  40 mg Oral Daily  . rivastigmine  13.8 mg Transdermal Daily  . warfarin  4 mg Oral ONCE-1800  . Warfarin - Pharmacist Dosing Inpatient   Does not apply q1800   Continuous Infusions:   LOS: 0 days    Time spent: 35 min    [place], DO [facility] Pager [phone]  If 7PM-7AM, please contact night-coverage [email or web address] Password [facility] 05/12/2016, 12:39 PM

## 2016-05-12 NOTE — Progress Notes (Signed)
SLP Cancellation Note  Patient Details Name: Amyracle Scelsi MRN: [id number] DOB: 1936/05/08   Cancelled treatment:       Reason Eval/Treat Not Completed: SLP screened, no needs identified, will sign off. Discussed SLP services with pt family in room while pt away at a procedure. They agreed pt would not need SLP f/u and no eval needed. Will sign off.   Herbie [place], [place] CCC-SLP [phone]  Lynann Beaver 05/12/2016, 10:31 AM

## 2016-05-12 NOTE — Care Management Note (Signed)
Case Management Note  Patient Details  Name: [name] MRN: [id number] Date of Birth: 1935-06-12  Subjective/Objective:   Pt in with lt sided weakness. She is from home with family and caregivers.                  Action/Plan: CM spoke to patients daughter in law, [name], over the phone and she would like to use [facility] for [facility] services if ordered. OT recommending no f/u. PT is recommending [place] services. CM following for discharge needs.   Expected Discharge Date:                  Expected Discharge Plan:  [place]  In-House Referral:     Discharge planning Services  CM Consult  Post Acute Care Choice:    Choice offered to:     DME Arranged:    DME Agency:     HH Arranged:    [place] Agency:     Status of Service:  In process, will continue to follow  If discussed at Long Length of Stay Meetings, dates discussed:    Additional Comments:  Pollie Friar, RN 05/12/2016, 3:35 PM

## 2016-05-12 NOTE — Care Management Obs Status (Signed)
[facility] NOTIFICATION   Patient Details  Name: [name] MRN: [id number] Date of Birth: 1936-02-05   Medicare Observation Status Notification Given:  Yes    Pollie Friar, RN 05/12/2016, 3:33 PM

## 2016-05-12 NOTE — Progress Notes (Signed)
Made on call aware that MRI could not be completed at this time d/t patient not staying still enough; one time dose 0.5mg  ativan was given prior.  No new orders given at this time. Continue to monitor at this time.

## 2016-05-12 NOTE — Progress Notes (Addendum)
ANTICOAGULATION CONSULT NOTE -Follow up  Pharmacy Consult for Coumadin Indication: h/o PE/DVT   No Known Allergies  Patient Measurements: Height: 5\' 5"  (165.1 cm) Weight: 110 lb 3.2 oz (50 kg) IBW/kg (Calculated) : 57  Vital Signs: Temp: 97.8 F (36.6 C) (01/04 0530) Temp Source: Axillary (01/04 0530) BP: 176/93 (01/04 0930) Pulse Rate: 77 (01/04 0930)  Labs:  Recent Labs  05/11/16 1614 05/11/16 1633 05/12/16 0723  HGB 11.2* 11.9*  --   HCT 35.7* 35.0*  --   PLT 223  --   --   APTT 32  --   --   LABPROT 14.7  --  14.8  INR 1.14  --  1.16  CREATININE 0.57 0.50  --     Estimated Creatinine Clearance: 45.7 mL/min (by C-G formula based on SCr of 0.5 mg/dL).   Medical History: Past Medical History:  Diagnosis Date  . Dementia   . Diabetes mellitus type II 03/21/2011  . DVT (deep venous thrombosis) ([place]) 03/21/2011  . Gallstones   . Hyperlipidemia 03/21/2011  . Hypertension 03/21/2011  . Lymphedema of leg 03/21/2011  . Pulmonary embolism ([place]) 03/21/2011    Medications:  Prescriptions Prior to Admission  Medication Sig Dispense Refill Last Dose  . atorvastatin (LIPITOR) 20 MG tablet Take 20 mg by mouth daily at 6 PM.    05/10/2016 at Unknown time  . cloNIDine (CATAPRES) 0.1 MG tablet Take 0.1 mg by mouth 2 (two) times daily.   05/11/2016 at Unknown time  . Dexlansoprazole (DEXILANT) 30 MG capsule Take 30 mg by mouth daily.   05/11/2016 at Unknown time  . gabapentin (NEURONTIN) 300 MG capsule Take 300 mg by mouth 3 (three) times daily.   05/11/2016 at Unknown time  . metFORMIN (GLUCOPHAGE) 1000 MG tablet Take 1,000 mg by mouth 2 (two) times daily with a meal.   05/11/2016 at Unknown time  . Rivastigmine (EXELON) 13.3 MG/24HR PT24 Place 1 patch onto the skin daily.   05/11/2016 at Unknown time  . warfarin (COUMADIN) 2 MG tablet Take 2 mg by mouth daily at 6 PM.   05/10/2016 at 1800   Scheduled:  . aspirin  300 mg Rectal Daily   Or  . aspirin  325 mg Oral Daily  .  atorvastatin  40 mg Oral q1800  . enoxaparin (LOVENOX) injection  40 mg Subcutaneous Q24H  . gabapentin  300 mg Oral TID  . insulin aspart  0-9 Units Subcutaneous Q4H  . pantoprazole  40 mg Oral Daily  . rivastigmine  13.8 mg Transdermal Daily  . Warfarin - Pharmacist Dosing Inpatient   Does not apply q1800   Infusions:    Assessment: 81yo female with h/o dementia presented to [facility] ED on 05/11/16  w/ new-onset left-sided weakness c/w TIA. She is to continue Coumadin for h/o PE/DVT.  On admit 05/11/16, the INR was below goal and patient was started on  low-dose LMWH 40mg  SQ q24h  until INR therapeutic.  PTA coumadin was 2 mg daily ; last dose pta  taken 05/10/16. Today 05/12/16 the INR is 1.16, remains SUBtherapeutic. No CBC this AM. No bleeding reported. CT head 1/3: no acute intracranial hemorrhage, + chronic lef cerebellar infarct. 1/4:  Carotid Duplex Completed. No evidence of a significant stenosis noted in bilateral carotid arteries.  She has been taking coumadin for over 20 years.  EPIC chart review reveals : 03/21/2011 hematology office visit with Dr. Marin Olp, that she had a PE about 20 years ago, followed by at  DVT RLE and she has been on lifelong coumadin since then. On 12/28/2010 BLE dopplers were negative for DVT, thus hematology consulte requested.   Dr. Marin Olp did not recommend an extensive hypercoagulable workup since it would not change medical management for her as pt has clearly shown blood is hypercoagulable for whatever reason and to continue coumadin.  Coumadin 4mg  dose given early this AM at 01:34 INR @07 :23 = 1.16  Lovenox 40mg  SQ q24h continues per MD orders until INR therapeutic.  CrCl ~ 45 ml/min pltc wnl.  Goal of Therapy:  INR 2-3   Plan:  Will give Coumadin 4mg  po x1 tonight Daily INR for dose adjustments. Plan to DC lovenox when INR =/>2  Thank you for allowing pharmacy to be part of this patients care team. Nicole Cella, RPh Clinical Pharmacist Pager:  [phone] 8A-4P [phone] 4P-10P [phone] [facility] [phone] 05/12/2016,10:24 AM

## 2016-05-12 NOTE — Progress Notes (Signed)
Preliminary results by tech - Carotid Duplex Completed. No evidence of a significant stenosis noted in bilateral carotid arteries. Vertebral arteries demonstrated antegrade flow. Makenli Derstine, BS, RDMS, RVT  

## 2016-05-13 DIAGNOSIS — R531 Weakness: Secondary | ICD-10-CM

## 2016-05-13 LAB — BASIC METABOLIC PANEL
ANION GAP: 9 (ref 5–15)
BUN: 8 mg/dL (ref 6–20)
CHLORIDE: 101 mmol/L (ref 101–111)
CO2: 30 mmol/L (ref 22–32)
Calcium: 8.6 mg/dL — ABNORMAL LOW (ref 8.9–10.3)
Creatinine, Ser: 0.56 mg/dL (ref 0.44–1.00)
GFR calc Af Amer: >60 mL/min (ref >60)
GLUCOSE: 101 mg/dL — AB (ref 65–99)
Potassium: 3.2 mmol/L — ABNORMAL LOW (ref 3.5–5.1)
Sodium: 140 mmol/L (ref 135–145)

## 2016-05-13 LAB — CBC
HEMATOCRIT: 30.8 % — AB (ref 36.0–46.0)
HEMOGLOBIN: 9.7 g/dL — AB (ref 12.0–15.0)
MCH: 28.1 pg (ref 26.0–34.0)
MCHC: 31.5 g/dL (ref 30.0–36.0)
MCV: 89.3 fL (ref 78.0–100.0)
PLATELETS: 216 10*3/uL (ref 150–400)
RBC: 3.45 MIL/uL — AB (ref 3.87–5.11)
RDW: 13 % (ref 11.5–15.5)
WBC: 3.5 10*3/uL — AB (ref 4.0–10.5)

## 2016-05-13 LAB — FOLATE RBC
Folate, Hemolysate: 270.8 ng/mL
Folate, RBC: 787 ng/mL (ref >498)
Hematocrit: 34.4 % (ref 34.0–46.6)

## 2016-05-13 LAB — GLUCOSE, CAPILLARY
GLUCOSE-CAPILLARY: 149 mg/dL — AB (ref 65–99)
GLUCOSE-CAPILLARY: 126 mg/dL — AB (ref 65–99)
Glucose-Capillary: 107 mg/dL — ABNORMAL HIGH (ref 65–99)
Glucose-Capillary: 143 mg/dL — ABNORMAL HIGH (ref 65–99)

## 2016-05-13 LAB — PROTIME-INR
INR: 1.26
Prothrombin Time: 15.9 seconds — ABNORMAL HIGH (ref 11.4–15.2)

## 2016-05-13 LAB — T4, FREE: Free T4: 1.17 ng/dL — ABNORMAL HIGH (ref 0.61–1.12)

## 2016-05-13 LAB — TSH: TSH: 2.831 u[IU]/mL (ref 0.350–4.500)

## 2016-05-13 LAB — HEMOGLOBIN A1C
Hgb A1c MFr Bld: 6.1 % — ABNORMAL HIGH (ref 4.8–5.6)
Mean Plasma Glucose: 128 mg/dL

## 2016-05-13 MED ORDER — WARFARIN SODIUM 4 MG PO TABS
4.0000 mg | ORAL_TABLET | Freq: Once | ORAL | Status: AC
  Administered 2016-05-13: 4 mg via ORAL
  Filled 2016-05-13: qty 1

## 2016-05-13 MED ORDER — POTASSIUM CHLORIDE CRYS ER 20 MEQ PO TBCR
40.0000 meq | EXTENDED_RELEASE_TABLET | Freq: Every day | ORAL | Status: DC
  Administered 2016-05-13 – 2016-05-15 (×3): 40 meq via ORAL
  Filled 2016-05-13 (×3): qty 2

## 2016-05-13 MED ORDER — COUMADIN BOOK
Freq: Once | Status: DC
  Filled 2016-05-13: qty 1

## 2016-05-13 NOTE — Progress Notes (Signed)
ANTICOAGULATION CONSULT NOTE -Follow up  Pharmacy Consult for Coumadin Indication: h/o PE/DVT   No Known Allergies  Patient Measurements: Height: 5\' 5"  (165.1 cm) Weight: 110 lb 3.2 oz (50 kg) IBW/kg (Calculated) : 57  Vital Signs: Temp: 98.5 F (36.9 C) (01/05 1022) Temp Source: Oral (01/05 1022) BP: 154/81 (01/05 1022) Pulse Rate: 78 (01/05 1022)  Labs:  Recent Labs  05/11/16 1614 05/11/16 1633 05/12/16 0723 05/13/16 0546  HGB 11.2* 11.9*  --  9.7*  HCT 35.7* 35.0*  --  30.8*  PLT 223  --   --  216  APTT 32  --   --   --   LABPROT 14.7  --  14.8 15.9*  INR 1.14  --  1.16 1.26  CREATININE 0.57 0.50  --  0.56    Estimated Creatinine Clearance: 45.7 mL/min (by C-G formula based on SCr of 0.56 mg/dL).   Medical History: Past Medical History:  Diagnosis Date  . Dementia   . Diabetes mellitus type II 03/21/2011  . DVT (deep venous thrombosis) ([place]) 03/21/2011  . Gallstones   . Hyperlipidemia 03/21/2011  . Hypertension 03/21/2011  . Lymphedema of leg 03/21/2011  . Pulmonary embolism ([place]) 03/21/2011    Medications:  Prescriptions Prior to Admission  Medication Sig Dispense Refill Last Dose  . atorvastatin (LIPITOR) 20 MG tablet Take 20 mg by mouth daily at 6 PM.    05/10/2016 at Unknown time  . cloNIDine (CATAPRES) 0.1 MG tablet Take 0.1 mg by mouth 2 (two) times daily.   05/11/2016 at Unknown time  . Dexlansoprazole (DEXILANT) 30 MG capsule Take 30 mg by mouth daily.   05/11/2016 at Unknown time  . gabapentin (NEURONTIN) 300 MG capsule Take 300 mg by mouth 3 (three) times daily.   05/11/2016 at Unknown time  . metFORMIN (GLUCOPHAGE) 1000 MG tablet Take 1,000 mg by mouth 2 (two) times daily with a meal.   05/11/2016 at Unknown time  . Rivastigmine (EXELON) 13.3 MG/24HR PT24 Place 1 patch onto the skin daily.   05/11/2016 at Unknown time  . warfarin (COUMADIN) 2 MG tablet Take 2 mg by mouth daily at 6 PM.   05/10/2016 at 1800   Scheduled:  . aspirin  300 mg Rectal Daily    Or  . aspirin  325 mg Oral Daily  . atorvastatin  40 mg Oral q1800  . cloNIDine  0.1 mg Oral TID  . enoxaparin (LOVENOX) injection  40 mg Subcutaneous Q24H  . gabapentin  300 mg Oral TID  . insulin aspart  0-5 Units Subcutaneous QHS  . insulin aspart  0-9 Units Subcutaneous TID WC  . metFORMIN  1,000 mg Oral BID WC  . pantoprazole  40 mg Oral Daily  . potassium chloride  40 mEq Oral Daily  . rivastigmine  13.8 mg Transdermal Daily  . Warfarin - Pharmacist Dosing Inpatient   Does not apply q1800   Infusions:    Assessment: 81yo female with h/o dementia presented to [facility] ED on 05/11/16  w/ new-onset left-sided weakness c/w TIA. She is to continue Coumadin for h/o PE/DVT.  On admit 05/11/16, the INR was below goal and patient was started on  low-dose LMWH 40mg  SQ q24h  until INR therapeutic.  PTA coumadin was 2 mg daily ; last dose pta  taken 05/10/16. Today 05/12/16 the INR is 1.16, remains SUBtherapeutic. No CBC this AM. No bleeding reported. CT head 1/3: no acute intracranial hemorrhage, + chronic lef cerebellar infarct. 1/4:  Carotid Duplex  Completed. No evidence of a significant stenosis noted in bilateral carotid arteries.  She has been taking coumadin for over 20 years.  EPIC chart review reveals : 03/21/2011 hematology office visit with Dr. Marin Olp, that she had a PE about 20 years ago, followed by at DVT RLE and she has been on lifelong coumadin since then. On 12/28/2010 BLE dopplers were negative for DVT, thus hematology consulte requested.   Dr. Marin Olp did not recommend an extensive hypercoagulable workup since it would not change medical management for her as pt has clearly shown blood is hypercoagulable for whatever reason and to continue coumadin.  Coumadin 4mg  dose given early this AM at 01:34 INR @07 :23 = 1.16  Lovenox 40mg  SQ q24h continues per MD orders until INR therapeutic.  CrCl ~ 45 ml/min pltc wnl.  Goal of Therapy:  INR 2-3   Plan:  Will give Coumadin 4mg  po x1  tonight Daily INR for dose adjustments. Plan to DC lovenox when INR =/>2  Thank you for allowing pharmacy to be part of this patients care team. Nicole Cella, RPh Clinical Pharmacist Pager: [phone] 8A-4P [phone] 4P-10P [phone] [facility] #[id number] 05/13/2016,11:49 AM

## 2016-05-13 NOTE — Discharge Instructions (Signed)
Information on my medicine - Coumadin   (Warfarin) --You were taking Coumadin (Warfarin) prior to this hospital admission. On coumadin (Warfarin) for Past medical history of Pulmonary Embolus and Deep Vein Thrombosis .   Why was Coumadin prescribed for you? Coumadin was prescribed for you because you have a blood clot or a medical condition that can cause an increased risk of forming blood clots. Blood clots can cause serious health problems by blocking the flow of blood to the heart, lung, or brain. Coumadin can prevent harmful blood clots from forming. As a reminder your indication for Coumadin is:  Past medical history of Pulmonary Embolus and Deep Vein Thrombosis   History of  Pulmonary Embolism Treatment   What test will check on my response to Coumadin? While on Coumadin (warfarin) you will need to have an INR test regularly to ensure that your dose is keeping you in the desired range. The INR (international normalized ratio) number is calculated from the result of the laboratory test called prothrombin time (PT).  If an INR APPOINTMENT HAS NOT ALREADY BEEN MADE FOR YOU please schedule an appointment to have this lab work done by your health care provider within 7 days. Your INR goal is usually a number between:  2 to 3 or your provider may give you a more narrow range like 2-2.5.  Ask your health care provider during an office visit what your goal INR is.  What  do you need to  know  About  COUMADIN? Take Coumadin (warfarin) exactly as prescribed by your healthcare provider about the same time each day.  DO NOT stop taking without talking to the doctor who prescribed the medication.  Stopping without other blood clot prevention medication to take the place of Coumadin may increase your risk of developing a new clot or stroke.  Get refills before you run out.  What do you do if you miss a dose? If you miss a dose, take it as soon as you remember on the same day then continue your regularly  scheduled regimen the next day.  Do not take two doses of Coumadin at the same time.  Important Safety Information A possible side effect of Coumadin (Warfarin) is an increased risk of bleeding. You should call your healthcare provider right away if you experience any of the following: ? Bleeding from an injury or your nose that does not stop. ? Unusual colored urine (red or dark brown) or unusual colored stools (red or black). ? Unusual bruising for unknown reasons. ? A serious fall or if you hit your head (even if there is no bleeding).  Some foods or medicines interact with Coumadin (warfarin) and might alter your response to warfarin. To help avoid this: ? Eat a balanced diet, maintaining a consistent amount of Vitamin K. ? Notify your provider about major diet changes you plan to make. ? Avoid alcohol or limit your intake to 1 drink for women and 2 drinks for men per day. (1 drink is 5 oz. wine, 12 oz. beer, or 1.5 oz. liquor.)  Make sure that ANY health care provider who prescribes medication for you knows that you are taking Coumadin (warfarin).  Also make sure the healthcare provider who is monitoring your Coumadin knows when you have started a new medication including herbals and non-prescription products.  Coumadin (Warfarin)  Major Drug Interactions  Increased Warfarin Effect Decreased Warfarin Effect  Alcohol (large quantities) Antibiotics (esp. Septra/Bactrim, Flagyl, Cipro) Amiodarone (Cordarone) Aspirin (ASA) Cimetidine (Tagamet) Megestrol (  Megace) NSAIDs (ibuprofen, naproxen, etc.) Piroxicam (Feldene) Propafenone (Rythmol SR) Propranolol (Inderal) Isoniazid (INH) Posaconazole (Noxafil) Barbiturates (Phenobarbital) Carbamazepine (Tegretol) Chlordiazepoxide (Librium) Cholestyramine (Questran) Griseofulvin Oral Contraceptives Rifampin Sucralfate (Carafate) Vitamin K   Coumadin (Warfarin) Major Herbal Interactions  Increased Warfarin Effect Decreased Warfarin  Effect  Garlic Ginseng Ginkgo biloba Coenzyme Q10 Green tea St. Johns wort    Coumadin (Warfarin) FOOD Interactions  Eat a consistent number of servings per week of foods HIGH in Vitamin K (1 serving =  cup)  Collards (cooked, or boiled & drained) Kale (cooked, or boiled & drained) Mustard greens (cooked, or boiled & drained) Parsley *serving size only =  cup Spinach (cooked, or boiled & drained) Swiss chard (cooked, or boiled & drained) Turnip greens (cooked, or boiled & drained)  Eat a consistent number of servings per week of foods MEDIUM-HIGH in Vitamin K (1 serving = 1 cup)  Asparagus (cooked, or boiled & drained) Broccoli (cooked, boiled & drained, or raw & chopped) Brussel sprouts (cooked, or boiled & drained) *serving size only =  cup Lettuce, raw (green leaf, endive, romaine) Spinach, raw Turnip greens, raw & chopped   These websites have more information on Coumadin (warfarin):  FailFactory.se; VeganReport.com.au;

## 2016-05-13 NOTE — Progress Notes (Signed)
Physical Therapy Treatment Patient Details Name: [name] MRN: [id number] DOB: Oct 30, 1935 Today's Date: 05/13/2016    History of Present Illness Pt is a 81 y.o. female who presented to the ED with L arm weakness, L facial droop, slurred speech, and drooling that family witnessed on 05/11/16. These symptoms have waxed and waned and have improved greatly. She has a PMH significant for diabetes mellitus, hypertension, hyperlipidemia, lymphedema of L leg, history of remote DVT and PE, and dementia. CT negative for acute changes, MRI is pending.     PT Comments    Had attempted once to see pt and she had visitors from out of town and the caregiver requested we return later.  Then less than 30 minutes later pt was being assisted into bed by nursing, and caregiver reported she was tired.  Assisted with in bed therex and noted pt was more alert in chair with caregiver reporting she had assisted pt up to chair and felt she was close to her baseline.  Feel she is appropriate for home with HHPT and 24 hour assist.   Follow Up Recommendations  Home health PT;Supervision/Assistance - 24 hour     Equipment Recommendations  None recommended by PT    Recommendations for Other Services       Precautions / Restrictions Precautions Precautions: Fall Precaution Comments: weak at baseline (left greater than right)    Mobility  Bed Mobility               General bed mobility comments: patient back in bed, NT due to caregiver declining for pt for OOB due to fatigue  Transfers                    Ambulation/Gait                 Stairs            Wheelchair Mobility    Modified Rankin (Stroke Patients Only)       Balance                                    Cognition Arousal/Alertness: Lethargic Behavior During Therapy: WFL for tasks assessed/performed Overall Cognitive Status: History of cognitive impairments - at baseline                  General Comments: At baseline pt with dementia    Exercises General Exercises - Lower Extremity Ankle Circles/Pumps: AAROM;Both;5 reps;Supine Heel Slides: AAROM;Both;5 reps;Supine Hip ABduction/ADduction: AAROM;Both;5 reps;Supine    General Comments        Pertinent Vitals/Pain Pain Assessment: No/denies pain    Home Living                      Prior Function            PT Goals (current goals can now be found in the care plan section) Progress towards PT goals: Progressing toward goals    Frequency    Min 3X/week      PT Plan Current plan remains appropriate    Co-evaluation             End of Session   Activity Tolerance: Patient limited by fatigue Patient left: in bed;with call bell/phone within reach;with family/visitor present     Time: 1350-1410 PT Time Calculation (min) (ACUTE ONLY): 20 min  Charges:  $Therapeutic Exercise: 8-22 mins  G CodesReginia Naas 05/13/2016, 4:39 PM  Magda Kiel, [place] 05/13/2016

## 2016-05-13 NOTE — Progress Notes (Signed)
PROGRESS NOTE    [name]  [id number] DOB: [birth year] DOA: 05/11/2016 PCP: Corinna Lines, MD   Outpatient Specialists:     Brief Narrative:   81 y.o.? 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  noted the patient's left arm to be flaccid with a left facial droop, drooling, and slurred speech. There is been no recent fall or trauma, no alcohol or illicit substance use, and no recent changes to her medications.  Patient is managed with Coumadin for a history of remote DVT and PE, but her daughter-in-law notes that INR has been only slightly above 1 on consecutive recent measurements, but her clinician did not want to increase her dose secondary to fall risk.     Assessment & Plan:   Principal Problem:   Left-sided weakness Active Problems:   History of pulmonary embolism   History of DVT (deep vein thrombosis)   Diabetes mellitus type II, non insulin dependent (HCC)   Hyperlipidemia   Hypertension   Dysarthria   Acute left-sided weakness   Acute CVA (cerebrovascular accident) ([place])   Left-sided weakness, dysarthria - Head CT negative for acute intracranial abnormality - Neurology consult appreciated-have s/o - No tPA given resolved sxs and unclear LKW   - PT/OT recommending home health 24 hour supervision - have been unable to get MRI/MRA - carotid US shows no significant stenosis -TTE EF 60-65% -A1c 6.1 -fasting lipids  -  increase warfarin to therapeutic levels-- lovenox until INR >2 - Permissive HTN to 210/110 in acute phase  Hx of DVT and PE - Pt has remote hx of DVT and PE and has been managed with coumadin  - INR has been just over 1 on consecutive recent measurements, but per family, dose was not increased d/t concerns  regarding falls  - She has been bedbound since last summer and may no longer present a fall-risk  - Neurology has advised increasing coumadin to therapeutic levels; will continue prophylactic sq Lovenox  -Discharge once INR level greater than 2  Type II DM , A1c 6.1 indicating good control - Managed with metformin only at home; this will be held  - SSI - A1c pending  Hypertension  - BP markedly elevated in ED - She is managed with clonidine at home; this is held on admission and resumed on 05/13/2016 is no acute CVA found - Plan to permit hypertension to 210/110 in acute phase of suspected TIA/CVA; labetalol IVPs available prn     Hyperlipidemia  - Continue Lipitor, increased to 40 mg qHS  - Fasting lipid panel pending   Generalized weakness, failure to thrive - Pt has been bedbound since last summer and moved here from [place] at that time so that local children could help care for her  - Unclear etiology for this; will check thyroid studies: TSH is 6.336--T4 is 1.21--I doubt these results and will repeat both labs today 05/13/16, B12 301 , folate normal  DVT prophylaxis:  Lovenox until INR >2 Code Status: Full Code Family Communication: dw family bedside-she will have superivsion at home  Disposition Plan:  Home once work up complete   Consultants:   neuro    Subjective:  Confused but pleasant Cannot orient to time/date year  Objective: Vitals:   05/12/16 1822 05/12/16 2233 05/13/16 0147 05/13/16 [id number]  BP: (!) 199/81 (!) 193/80 (!) 184/79 (!) 148/79  Pulse: 72 61 (!) 59 67  Resp: 16 18 18 18   Temp: 98.9 F (37.2 C) 97.8 F (36.6 C) 97.5 F (36.4 C) 97.7 F (36.5 C)  TempSrc: Axillary Axillary Oral Oral  SpO2: 100% 98% 98% 100%  Weight:      Height:       No intake or output data in the 24 hours ending 05/13/16 0858 Filed Weights   05/11/16 1609 05/11/16 2130  Weight: 54.4 kg (120 lb) 50 kg (110 lb 3.2 oz)    Examination:  General exam:  calm confused Respiratory system: Clear to auscultation. Respiratory effort normal. Cardiovascular system: S1 & S2 heard, RRR. No JVD, murmurs, rubs, gallops or clicks. No pedal edema. Gastrointestinal system: Abdomen is nondistended, soft and nontender. No organomegaly or masses felt. Normal bowel sounds heard. Central nervous system: Alert-- pleasant  .     Data Reviewed: I have personally reviewed following labs and imaging studies  CBC:  Recent Labs Lab 05/11/16 1614 05/11/16 1633 05/13/16 0546  WBC 4.4  --  3.5*  NEUTROABS 2.3  --   --   HGB 11.2* 11.9* 9.7*  HCT 35.7* 35.0* 30.8*  MCV 90.6  --  89.3  PLT 223  --  123XX123   Basic Metabolic Panel:  Recent Labs Lab 05/11/16 1614 05/11/16 1633 05/13/16 0546  NA 138 141 140  K 3.8 3.7 3.2*  CL 99* 98* 101  CO2 29  --  30  GLUCOSE 171* 177* 101*  BUN 13 15 8   CREATININE 0.57 0.50 0.56  CALCIUM 9.3  --  8.6*   GFR: Estimated Creatinine Clearance: 45.7 mL/min (by C-G formula based on SCr of 0.56 mg/dL). Liver Function Tests:  Recent Labs Lab 05/11/16 1614  AST 17  ALT 10*  ALKPHOS 62  BILITOT 0.3  PROT 6.8  ALBUMIN 3.3*   No results for input(s): LIPASE, AMYLASE in the last 168 hours. No results for input(s): AMMONIA in the last 168 hours. Coagulation Profile:  Recent Labs Lab 05/11/16 1614 05/12/16 0723 05/13/16 0546  INR 1.14 1.16 1.26   Cardiac Enzymes: No results for input(s): CKTOTAL, CKMB, CKMBINDEX, TROPONINI in the last 168 hours. BNP (last 3 results) No results for input(s): PROBNP in the last 8760 hours. HbA1C:  Recent Labs  05/11/16 2114  HGBA1C 6.1*   CBG:  Recent Labs Lab 05/12/16 0531 05/12/16 1153 05/12/16 1629 05/12/16 2230 05/13/16 0635  GLUCAP 72 107* 136* 206* 107*   Lipid Profile:  Recent Labs  05/12/16 0723  CHOL 148  HDL 43  LDLCALC 96  TRIG 46  CHOLHDL 3.4   Thyroid Function Tests:  Recent Labs  05/12/16 0723  TSH 6.336*  FREET4 1.20*   Anemia  Panel:  Recent Labs  05/12/16 0723  VITAMINB12 301   Urine analysis: No results found for: COLORURINE, APPEARANCEUR, LABSPEC, PHURINE, GLUCOSEU, HGBUR, BILIRUBINUR, KETONESUR, PROTEINUR, UROBILINOGEN, NITRITE, LEUKOCYTESUR   )No results found for this or any previous visit (from the past 240 hour(s)).    Anti-infectives    None       Radiology Studies: Dg Chest 2 View  Result Date: 05/11/2016 CLINICAL DATA:  Left-sided weakness and slurred speech beginning today. EXAM: CHEST  2 VIEW COMPARISON:  Single-view of the chest 03/08/2016. FINDINGS: The lungs are clear. Heart size is upper normal. No pneumothorax or pleural effusion. Atherosclerosis noted. Thoracolumbar scoliosis is also seen. IMPRESSION: No acute disease. Atherosclerosis. Electronically Signed  By: Inge Rise M.D.   On: 05/11/2016 20:00   Ct Head Wo Contrast  Result Date: 05/11/2016 CLINICAL DATA:  Left-sided weakness with drooling and slurred speech. EXAM: CT HEAD WITHOUT CONTRAST TECHNIQUE: Contiguous axial images were obtained from the base of the skull through the vertex without intravenous contrast. COMPARISON:  None. FINDINGS: Brain: Moderate degree of periventricular and subcortical white matter hypodensity consistent with small vessel ischemic disease likely chronic. Moderate ventriculomegaly consistent with central atrophy. Chronic left cerebellar infarct. No acute intracranial hemorrhage, midline shift or edema. No extra-axial fluid collection. No effacement of the basal cisterns or fourth ventricle. No acute large vascular territory Vascular: No hyperdense vessels. Calcifications of both carotid siphons. Skull: Nonacute Sinuses/Orbits: Symmetric appearing orbits. Clear mastoids. The visualized paranasal sinuses are unremarkable. Other: None IMPRESSION: Moderate degree of periventricular and subcortical white matter hypodensity likely reflecting chronic small vessel ischemic disease. Bilateral carotid siphon  calcifications. Central atrophy. No acute intracranial hemorrhage, midline shift nor large vascular territory infarction. Chronic left cerebellar infarct. Electronically Signed   By: Ashley Royalty M.D.   On: 05/11/2016 16:56   Mr Brain Wo Contrast  Result Date: 05/12/2016 CLINICAL DATA:  81 year old female with left arm weakness left facial droop drooling and slurred speech. Agitated. Initial encounter. EXAM: MRI HEAD WITHOUT CONTRAST TECHNIQUE: Multiplanar, multiecho pulse sequences of the brain and surrounding structures were obtained without intravenous contrast. COMPARISON:  Head CT without contrast 05/11/2016. FINDINGS: The examination had to be discontinued prior to completion due to patient agitation. Only axial diffusion weighted imaging could be obtained. No restricted diffusion or evidence of acute infarction. Evidence of encephalomalacia in the left cerebellum as seen on CT. Facilitated diffusion throughout the bilateral cerebral white matter compatible with chronic white matter changes. Stable ventricle size and configuration. No midline shift, mass effect, or evidence of intracranial mass lesion. IMPRESSION: 1. No evidence of acute infarct. 2. The examination had to be discontinued prior to completion due to patient agitation. Electronically Signed   By: Genevie Ann M.D.   On: 05/12/2016 15:46        Scheduled Meds: . aspirin  300 mg Rectal Daily   Or  . aspirin  325 mg Oral Daily  . atorvastatin  40 mg Oral q1800  . cloNIDine  0.1 mg Oral TID  . enoxaparin (LOVENOX) injection  40 mg Subcutaneous Q24H  . gabapentin  300 mg Oral TID  . insulin aspart  0-5 Units Subcutaneous QHS  . insulin aspart  0-9 Units Subcutaneous TID WC  . metFORMIN  1,000 mg Oral BID WC  . pantoprazole  40 mg Oral Daily  . potassium chloride  40 mEq Oral Daily  . rivastigmine  13.8 mg Transdermal Daily  . Warfarin - Pharmacist Dosing Inpatient   Does not apply q1800   Continuous Infusions:   LOS: 1 day     Time spent: 35 min    Verneita Griffes, MD [facility]   If 7PM-7AM, please contact night-coverage [email or web address] Password [email or web address] 05/13/2016, 8:58 AM

## 2016-05-13 NOTE — Care Management Note (Addendum)
Case Management Note  Patient Details  Name: [name] MRN: [id number] Date of Birth: 1935-05-11  Subjective/Objective:    Pt is from home with son and daughter in law. She has caregivers that assist with her care.                 Action/Plan: Pharmacy contacted CM to assist with follow up coumadin management after discharge. CM spoke to patients daughter in law, [name], and she states the patients primary is in [place] but she has appointment with Dr Eulas Post at [facility] on Jan 10th. She also has a consult in for Dr Marin Olp d/t her hypercoagulation. CM spoke to Dr Antonieta Pert office and they are not able to follow the patients coumadin levels since they have not seen her in the office since 2012. CM called [facility] and they are not able to follow the patients coumadin level until they have seen her on the 10th of January. CM informed pharmacy and they feel that [facility] will be able to manage the level since the patient wont be ready for d/c until her coumadin level is greater than 2.  [name] asked to use [facility] for [facility] services. She states they have used [facility] in the past and would like to use them again. She also asked for the same staff they have used in the past. [name] with [facility] informed and given the names of the staff the family asked to use. [name] accepted the referral. CM asked if INR checks could be done in the home. [name] verified that this service could be provided.   Expected Discharge Date:                  Expected Discharge Plan:  [place]  In-House Referral:     Discharge planning Services  CM Consult  Post Acute Care Choice:  Home Health Choice offered to:  Adult Children  DME Arranged:    DME Agency:     HH Arranged:  PT, RN [place] Agency:  [place]  Status of Service:  In process, will continue to follow  If discussed at Long Length of Stay Meetings, dates discussed:    Additional Comments:  Pollie Friar, RN 05/13/2016, 8:06 PM

## 2016-05-14 LAB — GLUCOSE, CAPILLARY
GLUCOSE-CAPILLARY: 119 mg/dL — AB (ref 65–99)
Glucose-Capillary: 97 mg/dL (ref 65–99)
Glucose-Capillary: 137 mg/dL — ABNORMAL HIGH (ref 65–99)
Glucose-Capillary: 186 mg/dL — ABNORMAL HIGH (ref 65–99)

## 2016-05-14 LAB — BASIC METABOLIC PANEL
Anion gap: 8 (ref 5–15)
BUN: 16 mg/dL (ref 6–20)
CALCIUM: 8.7 mg/dL — AB (ref 8.9–10.3)
CO2: 26 mmol/L (ref 22–32)
CREATININE: 0.53 mg/dL (ref 0.44–1.00)
Chloride: 103 mmol/L (ref 101–111)
Glucose, Bld: 98 mg/dL (ref 65–99)
Potassium: 4.3 mmol/L (ref 3.5–5.1)
SODIUM: 137 mmol/L (ref 135–145)

## 2016-05-14 LAB — PROTIME-INR
INR: 1.18
PROTHROMBIN TIME: 15.1 s (ref 11.4–15.2)

## 2016-05-14 MED ORDER — WARFARIN SODIUM 6 MG PO TABS
6.0000 mg | ORAL_TABLET | Freq: Once | ORAL | Status: AC
  Administered 2016-05-14: 6 mg via ORAL
  Filled 2016-05-14: qty 1

## 2016-05-14 NOTE — Progress Notes (Signed)
ANTICOAGULATION CONSULT NOTE -Follow up  Pharmacy Consult for Coumadin Indication: h/o PE/DVT   No Known Allergies  Patient Measurements: Height: 5\' 5"  (165.1 cm) Weight: 110 lb 3.2 oz (50 kg) IBW/kg (Calculated) : 57  Vital Signs: Temp: 98.5 F (36.9 C) (01/06 0929) Temp Source: Oral (01/06 0929) BP: 153/78 (01/06 0929) Pulse Rate: 80 (01/06 0929)  Labs:  Recent Labs  05/11/16 1614 05/11/16 1633 05/12/16 0723 05/13/16 0546 05/14/16 0744  HGB 11.2* 11.9*  --  9.7*  --   HCT 35.7* 35.0* 34.4 30.8*  --   PLT 223  --   --  216  --   APTT 32  --   --   --   --   LABPROT 14.7  --  14.8 15.9* 15.1  INR 1.14  --  1.16 1.26 1.18  CREATININE 0.57 0.50  --  0.56 0.53    Estimated Creatinine Clearance: 45.7 mL/min (by C-G formula based on SCr of 0.53 mg/dL).  Assessment: 26 YOF who continues on warfarin from PTA for hx DVT/PE. Admit INR 1.18 on PTA dose. Noted new TIA - pharmacy consulted to dose warfarin.  INR today remains SUBtherapeutic and trended down (INR 1.18 << 1.26, goal of 2-3). Noted Hgb/Hct drop on 1/5 - no overt s/sx of bleeding noted. Plts wnl - will continue to watch. The patient continues on Enox 40 for VTE px and ASA 325 for CVA px while awaiting INR >/=2.   The patient and caregiver have been re-educated on warfarin this admission.   Goal of Therapy:  INR 2-3   Plan:  1. Warfarin 6 mg x 1 dose at 1800 today 2. Continue Lovenox 40 mg/day for VTE px until INR >/=2 3. Continue ASA 325 mg for CVA px until INR >/=2 4. Will continue to monitor for any signs/symptoms of bleeding and will follow up with PT/INR in the a.m.   Thank you for allowing pharmacy to be a part of this patient's care.  Alycia Rossetti, PharmD, BCPS Clinical Pharmacist Pager: [phone] Clinical phone for 05/14/2016 from 7a-3:30p: [phone] If after 3:30p, please call main pharmacy at: [phone] 05/14/2016 12:45 PM

## 2016-05-14 NOTE — Progress Notes (Signed)
PROGRESS NOTE    [name]  [id number] DOB: 1935/12/20 DOA: 05/11/2016 PCP: Corinna Lines, MD   Outpatient Specialists:     Brief Narrative:    81 y.o.? 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  noted the patient's left arm to be flaccid with a left facial droop, drooling, and slurred speech. There is been no recent fall or trauma, no alcohol or illicit substance use, and no recent changes to her medications.  Patient is managed with Coumadin for a history of remote DVT and PE, but her daughter-in-law notes that INR has been only slightly above 1 on consecutive recent measurements, but her clinician did not want to increase her dose secondary to fall risk.   Assessment & Plan:   Principal Problem:   Left-sided weakness Active Problems:   History of pulmonary embolism   History of DVT (deep vein thrombosis)   Diabetes mellitus type II, non insulin dependent (HCC)   Hyperlipidemia   Hypertension   Dysarthria   Acute left-sided weakness   Acute CVA (cerebrovascular accident) ([place])   Right brain TIA secondary to hypercoagulable state Left-sided weakness, dysarthria - Head CT negative for acute intracranial abnormality - Neurology consult appreciated-have s/o - No tPA given resolved sxs and unclear LKW   - PT/OT recommending home health 24 hour supervision - have been unable to get MRI/MRA - carotid US shows no significant stenosis -TTE EF 60-65% -A1c 6.1 -fasting lipids  - increase warfarin to therapeutic levels-- lovenox until INR >2-currently 1.18 -Spoken to family and encouraged teaching of Lovenox injections for [place] as she will be going home with daughter-in-law and son and we can probably be discharged her home 05/15/2016 if  family is comfortable with this plan - Permissive HTN to 210/110 in acute phase--blood pressures improved slightly to 150s over 70s, 130/65  Hx of DVT and PE - Pt has remote hx of DVT and PE and has been managed with coumadin  - INR has been just over 1 on consecutive recent measurements, but per family, dose was not increased d/t concerns regarding falls  - She has been bedbound since last summer ? no longer present a fall-risk  - Neurology has advised increasing coumadin to therapeutic levels -Discharge once INR level greater than 2  Type II DM , A1c 6.1 indicating good control - Managed with metformin only at home; this will be held  - SSI, lunch sugar ranges have been 98-143  Hypertension  - BP markedly elevated in ED - She is managed with clonidine at home; held on admission and resumed on 05/13/2016 is no acute CVA found - Plan to permit hypertension to 210/110 in acute phase of suspected TIA/CVA; labetalol IVPs available prn     Hyperlipidemia  - Continue Lipitor, increased to 40 mg qHS  - Fasting lipid panel pending   Generalized weakness, failure to thrive - Pt has been bedbound since last summer and moved here from [place] at that time so that local children could help care for her  - Unclear etiology for this; will check thyroid studies: TSH is 6.336--T4 is 1.21--I doubt these results and will repeat both labs today 05/13/16 and they are within normal limits and appropriate so would not change management, B12 301 , folate normal  DVT prophylaxis:  Lovenox until INR >2  Code Status: Full Code  Family Communication: D/w family bedside-she will have superivsion at home  Disposition Plan:  Home once work up complete   Consultants:   neuro    Subjective:  Confused but pleasant no specific issues Family at the bedside long conversation No chest pain no nausea no shortness of breath Objective: Vitals:   05/13/16 2107 05/14/16 0052 05/14/16 0456  05/14/16 0929  BP: (!) 145/78 133/65 (!) 150/74 (!) 153/78  Pulse: 77 71 73 80  Resp:  18 18 18   Temp: 98.9 F (37.2 C) 97.9 F (36.6 C) 98 F (36.7 C) 98.5 F (36.9 C)  TempSrc: Oral Oral Oral Oral  SpO2: 99% 97% 98% 98%  Weight:      Height:       No intake or output data in the 24 hours ending 05/14/16 1021 Filed Weights   05/11/16 1609 05/11/16 2130  Weight: 54.4 kg (120 lb) 50 kg (110 lb 3.2 oz)    Examination:  General exam: calm confused, Mild pallor Respiratory system: Clear to auscultation. Respiratory effort normal. Cardiovascular system: S1 & S2 heard, RRR. No JVD, murmurs, rubs, gallops or clicks. No pedal edema. Gastrointestinal system: Abdomen is nondistended, soft and nontender. No organomegaly or masses felt. Normal bowel sounds heard. Central nervous system: Alert-- pleasant, lifting both legs bilaterally  Data Reviewed: I have personally reviewed following labs and imaging studies  CBC:  Recent Labs Lab 05/11/16 1614 05/11/16 1633 05/12/16 0723 05/13/16 0546  WBC 4.4  --   --  3.5*  NEUTROABS 2.3  --   --   --   HGB 11.2* 11.9*  --  9.7*  HCT 35.7* 35.0* 34.4 30.8*  MCV 90.6  --   --  89.3  PLT 223  --   --  123XX123   Basic Metabolic Panel:  Recent Labs Lab 05/11/16 1614 05/11/16 1633 05/13/16 0546 05/14/16 0744  NA 138 141 140 137  K 3.8 3.7 3.2* 4.3  CL 99* 98* 101 103  CO2 29  --  30 26  GLUCOSE 171* 177* 101* 98  BUN 13 15 8 16   CREATININE 0.57 0.50 0.56 0.53  CALCIUM 9.3  --  8.6* 8.7*   GFR: Estimated Creatinine Clearance: 45.7 mL/min (by C-G formula based on SCr of 0.53 mg/dL). Liver Function Tests:  Recent Labs Lab 05/11/16 1614  AST 17  ALT 10*  ALKPHOS 62  BILITOT 0.3  PROT 6.8  ALBUMIN 3.3*   No results for input(s): LIPASE, AMYLASE in the last 168 hours. No results for input(s): AMMONIA in the last 168 hours. Coagulation Profile:  Recent Labs Lab 05/11/16 1614 05/12/16 0723 05/13/16 0546 05/14/16 0744  INR  1.14 1.16 1.26 1.18   Cardiac Enzymes: No results for input(s): CKTOTAL, CKMB, CKMBINDEX, TROPONINI in the last 168 hours. BNP (last 3 results) No results for input(s): PROBNP in the last 8760 hours. HbA1C:  Recent Labs  05/11/16 2114  HGBA1C 6.1*   CBG:  Recent Labs Lab 05/13/16 0635 05/13/16 1143 05/13/16 1641 05/13/16 2142 05/14/16 0609  GLUCAP 107* 149* 126* 143* 97   Lipid Profile:  Recent Labs  05/12/16 0723  CHOL 148  HDL 43  LDLCALC 96  TRIG 46  CHOLHDL 3.4   Thyroid Function Tests:  Recent Labs  05/13/16 1009  TSH 2.831  FREET4 1.17*   Anemia Panel:  Recent Labs  05/12/16 0723  VITAMINB12 301   Urine analysis: No results found for:  COLORURINE, APPEARANCEUR, LABSPEC, [place], GLUCOSEU, HGBUR, BILIRUBINUR, KETONESUR, PROTEINUR, UROBILINOGEN, NITRITE, LEUKOCYTESUR   )No results found for this or any previous visit (from the past 240 hour(s)).    Anti-infectives    None      Radiology Studies: Mr Brain 46 Contrast  Result Date: 05/12/2016 CLINICAL DATA:  81 year old female with left arm weakness left facial droop drooling and slurred speech. Agitated. Initial encounter. EXAM: MRI HEAD WITHOUT CONTRAST TECHNIQUE: Multiplanar, multiecho pulse sequences of the brain and surrounding structures were obtained without intravenous contrast. COMPARISON:  Head CT without contrast 05/11/2016. FINDINGS: The examination had to be discontinued prior to completion due to patient agitation. Only axial diffusion weighted imaging could be obtained. No restricted diffusion or evidence of acute infarction. Evidence of encephalomalacia in the left cerebellum as seen on CT. Facilitated diffusion throughout the bilateral cerebral white matter compatible with chronic white matter changes. Stable ventricle size and configuration. No midline shift, mass effect, or evidence of intracranial mass lesion. IMPRESSION: 1. No evidence of acute infarct. 2. The examination had to be  discontinued prior to completion due to patient agitation. Electronically Signed   By: Genevie Ann M.D.   On: 05/12/2016 15:46    Scheduled Meds: . aspirin  300 mg Rectal Daily   Or  . aspirin  325 mg Oral Daily  . atorvastatin  40 mg Oral q1800  . cloNIDine  0.1 mg Oral TID  . coumadin book   Does not apply Once  . enoxaparin (LOVENOX) injection  40 mg Subcutaneous Q24H  . gabapentin  300 mg Oral TID  . insulin aspart  0-5 Units Subcutaneous QHS  . insulin aspart  0-9 Units Subcutaneous TID WC  . metFORMIN  1,000 mg Oral BID WC  . pantoprazole  40 mg Oral Daily  . potassium chloride  40 mEq Oral Daily  . rivastigmine  13.8 mg Transdermal Daily  . Warfarin - Pharmacist Dosing Inpatient   Does not apply q1800   Continuous Infusions:   LOS: 2 days    Time spent: 25 min    Verneita Griffes, MD [facility]   If 7PM-7AM, please contact night-coverage [email or web address] Password [email or web address] 05/14/2016, 10:21 AM

## 2016-05-15 LAB — CBC
HCT: 28 % — ABNORMAL LOW (ref 36.0–46.0)
HEMOGLOBIN: 8.8 g/dL — AB (ref 12.0–15.0)
MCH: 28.7 pg (ref 26.0–34.0)
MCHC: 31.4 g/dL (ref 30.0–36.0)
MCV: 91.2 fL (ref 78.0–100.0)
Platelets: 64 10*3/uL — ABNORMAL LOW (ref 150–400)
RBC: 3.07 MIL/uL — ABNORMAL LOW (ref 3.87–5.11)
RDW: 13.1 % (ref 11.5–15.5)
WBC: 3.1 10*3/uL — ABNORMAL LOW (ref 4.0–10.5)

## 2016-05-15 LAB — BASIC METABOLIC PANEL
Anion gap: 9 (ref 5–15)
BUN: 15 mg/dL (ref 6–20)
CALCIUM: 8.7 mg/dL — AB (ref 8.9–10.3)
CO2: 27 mmol/L (ref 22–32)
CREATININE: 0.55 mg/dL (ref 0.44–1.00)
Chloride: 102 mmol/L (ref 101–111)
GFR calc Af Amer: >60 mL/min (ref >60)
Glucose, Bld: 140 mg/dL — ABNORMAL HIGH (ref 65–99)
POTASSIUM: 4.7 mmol/L (ref 3.5–5.1)
SODIUM: 138 mmol/L (ref 135–145)

## 2016-05-15 LAB — CBC WITH DIFFERENTIAL/PLATELET
Basophils Absolute: 0 10*3/uL (ref 0.0–0.1)
Basophils Relative: 0 %
EOS ABS: 0.1 10*3/uL (ref 0.0–0.7)
EOS PCT: 1 %
HCT: 31.2 % — ABNORMAL LOW (ref 36.0–46.0)
Hemoglobin: 10 g/dL — ABNORMAL LOW (ref 12.0–15.0)
Lymphocytes Relative: 35 %
Lymphs Abs: 1.5 10*3/uL (ref 0.7–4.0)
MCH: 28.7 pg (ref 26.0–34.0)
MCHC: 32.1 g/dL (ref 30.0–36.0)
MCV: 89.7 fL (ref 78.0–100.0)
Monocytes Absolute: 0.5 10*3/uL (ref 0.1–1.0)
Monocytes Relative: 10 %
Neutro Abs: 2.4 10*3/uL (ref 1.7–7.7)
Neutrophils Relative %: 54 %
PLATELETS: 208 10*3/uL (ref 150–400)
RBC: 3.48 MIL/uL — AB (ref 3.87–5.11)
RDW: 12.9 % (ref 11.5–15.5)
WBC: 4.4 10*3/uL (ref 4.0–10.5)

## 2016-05-15 LAB — GLUCOSE, CAPILLARY: GLUCOSE-CAPILLARY: 108 mg/dL — AB (ref 65–99)

## 2016-05-15 LAB — PROTIME-INR
INR: 1.36
Prothrombin Time: 16.9 seconds — ABNORMAL HIGH (ref 11.4–15.2)

## 2016-05-15 MED ORDER — ASPIRIN 325 MG PO TABS: 325.0000 mg | ORAL_TABLET | Freq: Every day | ORAL | Status: DC

## 2016-05-15 MED ORDER — ENOXAPARIN SODIUM 40 MG/0.4ML ~~LOC~~ SOLN: 40.0000 mg | SUBCUTANEOUS | 0 refills | Status: DC

## 2016-05-15 MED ORDER — WARFARIN SODIUM 6 MG PO TABS
6.0000 mg | ORAL_TABLET | Freq: Once | ORAL | Status: DC
  Filled 2016-05-15: qty 1

## 2016-05-15 MED ORDER — POTASSIUM CHLORIDE CRYS ER 20 MEQ PO TBCR: 40.0000 meq | EXTENDED_RELEASE_TABLET | Freq: Every day | ORAL | 0 refills | Status: DC

## 2016-05-15 MED ORDER — WARFARIN SODIUM 2 MG PO TABS: 2.0000 mg | ORAL_TABLET | Freq: Every day | ORAL | 0 refills | Status: DC

## 2016-05-15 NOTE — Progress Notes (Signed)
Taught daughter how to give lovenox inj. She did well and is comfortable w/the process.  I told her to give 6mg  Coumadin tonite w/supper( that's what Pharmacist wrote for today's dose)and again tomorrow nite and [place] will draw INR on the 9th. Written on d/c home papers too. Dressed, home in few min via wc to car w/daughter and other family members.

## 2016-05-15 NOTE — Progress Notes (Deleted)
Physician Discharge Summary  [name] [id number] DOB: 10/30/1935 DOA: 05/11/2016  PCP: Corinna Lines, MD  Admit date: 05/11/2016 Discharge date: 05/15/2016  Time spent: 35 minutes  Recommendations for Outpatient Follow-up:  1. Patient will need bridge of Lovenox to Coumadin and an INR level of 2 to 3 prior to transition to just Coumadin 2. Patient should follow up with stroke service as an outpatient 3. Lovenox teaching has been performed by RN and family is comfortable taking her home and doing the bridging 4. Home health PT OT has been ordered as well as RN to do the Coumadin checks and this can be managed by her primary physician 5. Patient will need progressive lowering of the pressure as an outpatient   Discharge Diagnoses:  Principal Problem:   Left-sided weakness Active Problems:   History of pulmonary embolism   History of DVT (deep vein thrombosis)   Diabetes mellitus type II, non insulin dependent (HCC)   Hyperlipidemia   Hypertension   Dysarthria   Acute left-sided weakness   Acute CVA (cerebrovascular accident) [facility])   Discharge Condition: Improved  Diet recommendation: Heart healthy low-salt  Filed Weights   05/11/16 1609 05/11/16 2130  Weight: 54.4 kg (120 lb) 50 kg (110 lb 3.2 oz)    81 y.o.? 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  noted the patient's left arm to be flaccid with a left facial droop, drooling, and slurred speech. There is been no recent fall or trauma, no alcohol or illicit substance use, and no recent changes to her medications.  Patient is managed with Coumadin for a history of remote DVT and PE, but her daughter-in-law notes that INR has been only slightly above 1 on consecutive recent measurements, but  her clinician did not want to increase her dose secondary to fall risk.      Right brain TIA secondary to hypercoagulable state Left-sided weakness, dysarthria - Head CT negative for acute intracranial abnormality - Neurology consult appreciated-have s/o - No tPA given resolved sxs and unclear LKW   - PT/OT recommending home health 24 hour supervision - have been unable to get MRI/MRA - carotid US shows no significant stenosis -TTE EF 60-65% -A1c 6.1 -fasting lipids  - increase warfarin to therapeutic levels-- lovenox until INR >2-currently 1.36 on discharge home, I'm discharging with Coumadin 2 mg tablets -Spoken to family and encouraged teaching of Lovenox injections for the Bridge as she will be going home with daughter-in-law and son and we can probably be discharged her home 05/15/2016 if family is comfortable with this plan -Home health to draw INR level 05/17/16 and a follow-up level 05/23/16 -1 week Lovenox given - Permissive HTN to 210/110 in acute phase--blood pressures improved slightly to 150s over 70s, 130/65   Hx of DVT and PE - Pt has remote hx of DVT and PE and has been managed with coumadin  - INR has been just over 1 on consecutive recent measurements, but per family, dose was not increased d/t concerns regarding falls  - She has been bedbound since last summer ? no longer present a fall-risk  - Neurology has advised increasing coumadin to therapeutic levels -Discharged home with Lovenox to Coumadin bridge   Type II DM , A1c 6.1 indicating good control - Managed with metformin only at  home; this will be held  - SSI, lunch sugar ranges have been 98-143   Hypertension  - BP markedly elevated in ED - She is managed with clonidine at home; held on admission and resumed on 05/13/2016 is no acute CVA found - Plan to permit hypertension to 210/110 in acute phase of suspected TIA/CVA; labetalol IVPs available prn       Hyperlipidemia  - Continue Lipitor, increased to 40  mg qHS  - Fasting lipid panel pending    Generalized weakness, failure to thrive - Pt has been bedbound since last summer and moved here from [place] at that time so that local children could help care for her  - Unclear etiology for this; will check thyroid studies: TSH is 6.336--T4 is 1.21--I doubt these results and will repeat both labs today 05/13/16 and they are within normal limits and appropriate so would not change management, B12 301 , folate normal    Consultations:  Nerology  Discharge Exam: Vitals:   05/15/16 0525 05/15/16 0954  BP: (!) 176/83 (!) 166/66  Pulse: 62 73  Resp: 18 17  Temp: 98.5 F (36.9 C) 98.7 F (37.1 C)    Was to go visit her mother thinks that she is sick-patient is oriented  Alert otherwise pleasant Chest clinically clear Moving upper extremities smile symmetric  Discharge Instructions    Current Discharge Medication List    START taking these medications   Details  aspirin 325 MG tablet Take 1 tablet (325 mg total) by mouth daily. Please discontinue this medication when you start taking the Coumadin    enoxaparin (LOVENOX) 40 MG/0.4ML injection Inject 0.4 mLs (40 mg total) into the skin daily. Qty: 8 Syringe, Refills: 0    potassium chloride SA (K-DUR,KLOR-CON) 20 MEQ tablet Take 2 tablets (40 mEq total) by mouth daily. Qty: 20 tablet, Refills: 0      CONTINUE these medications which have CHANGED   Details  warfarin (COUMADIN) 2 MG tablet Take 1 tablet (2 mg total) by mouth daily at 6 PM. Qty: 15 tablet, Refills: 0      CONTINUE these medications which have NOT CHANGED   Details  atorvastatin (LIPITOR) 20 MG tablet Take 20 mg by mouth daily at 6 PM.     cloNIDine (CATAPRES) 0.1 MG tablet Take 0.1 mg by mouth 2 (two) times daily.    Dexlansoprazole (DEXILANT) 30 MG capsule Take 30 mg by mouth daily.    gabapentin (NEURONTIN) 300 MG capsule Take 300 mg by mouth 3 (three) times daily.    metFORMIN (GLUCOPHAGE) 1000 MG  tablet Take 1,000 mg by mouth 2 (two) times daily with a meal.    Rivastigmine (EXELON) 13.3 MG/24HR PT24 Place 1 patch onto the skin daily.       No Known Allergies    The results of significant diagnostics from this hospitalization (including imaging, microbiology, ancillary and laboratory) are listed below for reference.    Significant Diagnostic Studies: Dg Chest 2 View  Result Date: 05/11/2016 CLINICAL DATA:  Left-sided weakness and slurred speech beginning today. EXAM: CHEST  2 VIEW COMPARISON:  Single-view of the chest 03/08/2016. FINDINGS: The lungs are clear. Heart size is upper normal. No pneumothorax or pleural effusion. Atherosclerosis noted. Thoracolumbar scoliosis is also seen. IMPRESSION: No acute disease. Atherosclerosis. Electronically Signed   By: Inge Rise M.D.   On: 05/11/2016 20:00   Ct Head Wo Contrast  Result Date: 05/11/2016 CLINICAL DATA:  Left-sided weakness with drooling and slurred speech. EXAM:  CT HEAD WITHOUT CONTRAST TECHNIQUE: Contiguous axial images were obtained from the base of the skull through the vertex without intravenous contrast. COMPARISON:  None. FINDINGS: Brain: Moderate degree of periventricular and subcortical white matter hypodensity consistent with small vessel ischemic disease likely chronic. Moderate ventriculomegaly consistent with central atrophy. Chronic left cerebellar infarct. No acute intracranial hemorrhage, midline shift or edema. No extra-axial fluid collection. No effacement of the basal cisterns or fourth ventricle. No acute large vascular territory Vascular: No hyperdense vessels. Calcifications of both carotid siphons. Skull: Nonacute Sinuses/Orbits: Symmetric appearing orbits. Clear mastoids. The visualized paranasal sinuses are unremarkable. Other: None IMPRESSION: Moderate degree of periventricular and subcortical white matter hypodensity likely reflecting chronic small vessel ischemic disease. Bilateral carotid siphon  calcifications. Central atrophy. No acute intracranial hemorrhage, midline shift nor large vascular territory infarction. Chronic left cerebellar infarct. Electronically Signed   By: Ashley Royalty M.D.   On: 05/11/2016 16:56   Mr Brain Wo Contrast  Result Date: 05/12/2016 CLINICAL DATA:  81 year old female with left arm weakness left facial droop drooling and slurred speech. Agitated. Initial encounter. EXAM: MRI HEAD WITHOUT CONTRAST TECHNIQUE: Multiplanar, multiecho pulse sequences of the brain and surrounding structures were obtained without intravenous contrast. COMPARISON:  Head CT without contrast 05/11/2016. FINDINGS: The examination had to be discontinued prior to completion due to patient agitation. Only axial diffusion weighted imaging could be obtained. No restricted diffusion or evidence of acute infarction. Evidence of encephalomalacia in the left cerebellum as seen on CT. Facilitated diffusion throughout the bilateral cerebral white matter compatible with chronic white matter changes. Stable ventricle size and configuration. No midline shift, mass effect, or evidence of intracranial mass lesion. IMPRESSION: 1. No evidence of acute infarct. 2. The examination had to be discontinued prior to completion due to patient agitation. Electronically Signed   By: Genevie Ann M.D.   On: 05/12/2016 15:46    Microbiology: No results found for this or any previous visit (from the past 240 hour(s)).   Labs: Basic Metabolic Panel:  Recent Labs Lab 05/11/16 1614 05/11/16 1633 05/13/16 0546 05/14/16 0744  NA 138 141 140 137  K 3.8 3.7 3.2* 4.3  CL 99* 98* 101 103  CO2 29  --  30 26  GLUCOSE 171* 177* 101* 98  BUN 13 15 8 16   CREATININE 0.57 0.50 0.56 0.53  CALCIUM 9.3  --  8.6* 8.7*   Liver Function Tests:  Recent Labs Lab 05/11/16 1614  AST 17  ALT 10*  ALKPHOS 62  BILITOT 0.3  PROT 6.8  ALBUMIN 3.3*   No results for input(s): LIPASE, AMYLASE in the last 168 hours. No results for  input(s): AMMONIA in the last 168 hours. CBC:  Recent Labs Lab 05/11/16 1614 05/11/16 1633 05/12/16 0723 05/13/16 0546 05/15/16 0647  WBC 4.4  --   --  3.5* 3.1*  NEUTROABS 2.3  --   --   --   --   HGB 11.2* 11.9*  --  9.7* 8.8*  HCT 35.7* 35.0* 34.4 30.8* 28.0*  MCV 90.6  --   --  89.3 91.2  PLT 223  --   --  216 64*   Cardiac Enzymes: No results for input(s): CKTOTAL, CKMB, CKMBINDEX, TROPONINI in the last 168 hours. BNP: BNP (last 3 results) No results for input(s): BNP in the last 8760 hours.  ProBNP (last 3 results) No results for input(s): PROBNP in the last 8760 hours.  CBG:  Recent Labs Lab 05/14/16 [phone] 05/14/16 1203 05/14/16 1645  05/14/16 2104 05/15/16 0641  GLUCAP 97 119* 137* 186* 108*       Signed:  Nita Sells MD   [facility] 05/15/2016, 10:24 AM

## 2016-05-15 NOTE — Progress Notes (Signed)
ANTICOAGULATION CONSULT NOTE -Follow up  Pharmacy Consult for Coumadin Indication: h/o PE/DVT    No Known Allergies  Patient Measurements: Height: 5\' 5"  (165.1 cm) Weight: 110 lb 3.2 oz (50 kg) IBW/kg (Calculated) : 57  Vital Signs: Temp: 98.7 F (37.1 C) (01/07 0954) Temp Source: Oral (01/07 0954) BP: 166/66 (01/07 0954) Pulse Rate: 73 (01/07 0954)  Labs:  Recent Labs  05/13/16 0546 05/14/16 0744 05/15/16 0647  HGB 9.7*  --  8.8*  HCT 30.8*  --  28.0*  PLT 216  --  64*  LABPROT 15.9* 15.1 16.9*  INR 1.26 1.18 1.36  CREATININE 0.56 0.53  --     Estimated Creatinine Clearance: 45.7 mL/min (by C-G formula based on SCr of 0.53 mg/dL).  Assessment: 58 YOF who continues on warfarin from PTA for hx DVT/PE. Admit INR 1.18 on PTA dose. Noted new TIA - pharmacy consulted to dose warfarin.  INR today remains SUBtherapeutic however now trending up (INR 1.36 << 1.18, goal of 2-3). Initial CBC showed drop in Hgb and large drop in platelets however recheck shows CBC stable with previous values. No overt s/sx of bleeding noted. The patient continues on Enox 40 for VTE px and ASA 325 for CVA px while awaiting INR >/=2.   The patient and caregiver have been re-educated on warfarin this admission.   It is noted plans for discharge today - given trends in INR with warfarin dosing this admission would recommend a discharge dose of 5 mg daily with a INR recheck by the end of this week.   Goal of Therapy:  INR 2-3   Plan:  1. Warfarin 6 mg x 1 dose at 1800 today (if still here) 2. Continue Lovenox 40 mg/day for VTE px until INR >/=2 3. Continue ASA 325 mg for CVA px until INR >/=2 4. Will continue to monitor for any signs/symptoms of bleeding and will follow up with PT/INR in the a.m. (if still here)  Thank you for allowing pharmacy to be a part of this patient's care.  Alycia Rossetti, PharmD, BCPS Clinical Pharmacist Pager: [phone] Clinical phone for 05/15/2016 from 7a-3:30p:  [phone] If after 3:30p, please call main pharmacy at: [phone] 05/15/2016 11:29 AM

## 2016-05-15 NOTE — Progress Notes (Signed)
RN notified CM of discharge. Pt is set up with [facility] (per previous CM, Kellie).  Cm notified [facility] rep, Jermaine for [facility]  No other CM needs were communicated.

## 2016-05-18 ENCOUNTER — Ambulatory Visit (INDEPENDENT_AMBULATORY_CARE_PROVIDER_SITE_OTHER): Payer: Medicare Other | Admitting: Internal Medicine

## 2016-05-18 ENCOUNTER — Encounter: Payer: Self-pay | Admitting: Internal Medicine

## 2016-05-18 VITALS — BP 162/88 | HR 76 | Temp 98.1°F

## 2016-05-18 DIAGNOSIS — I890 Lymphedema, not elsewhere classified: Secondary | ICD-10-CM | POA: Diagnosis not present

## 2016-05-18 DIAGNOSIS — Z86711 Personal history of pulmonary embolism: Secondary | ICD-10-CM

## 2016-05-18 DIAGNOSIS — G459 Transient cerebral ischemic attack, unspecified: Secondary | ICD-10-CM

## 2016-05-18 DIAGNOSIS — E782 Mixed hyperlipidemia: Secondary | ICD-10-CM

## 2016-05-18 DIAGNOSIS — I639 Cerebral infarction, unspecified: Secondary | ICD-10-CM

## 2016-05-18 DIAGNOSIS — I679 Cerebrovascular disease, unspecified: Secondary | ICD-10-CM | POA: Diagnosis not present

## 2016-05-18 DIAGNOSIS — E1142 Type 2 diabetes mellitus with diabetic polyneuropathy: Secondary | ICD-10-CM

## 2016-05-18 DIAGNOSIS — Z86718 Personal history of other venous thrombosis and embolism: Secondary | ICD-10-CM | POA: Diagnosis not present

## 2016-05-18 DIAGNOSIS — R946 Abnormal results of thyroid function studies: Secondary | ICD-10-CM | POA: Diagnosis not present

## 2016-05-18 DIAGNOSIS — I10 Essential (primary) hypertension: Secondary | ICD-10-CM

## 2016-05-18 NOTE — Patient Instructions (Addendum)
Reduce metformin to 500mg  daily due to diarrhea. She has well controlled sugars at home  Check blood sugars fasting daily. GOAL <150. Bring blood sugar diary to next appt  Recommend follow up with Hematology 1/24th with Dr Martha Clan to determine if she is a candidate for eliquis vs xeralto instead of coumadin for DVT/PE history and recent TIA  Continue current medications as ordered  Check blood pressure 1hr after medication daily and bring diary to next visit. GOAL <160/90  GOAL INR 2-3 for now. INR to be drawn by home health (due next week)  Follow up in 1-2 mos for CPE/AWV. Fasting labs prior to appt

## 2016-05-18 NOTE — Progress Notes (Signed)
Patient ID: [name], female   DOB: 27-Nov-1935, 81 y.o.   MRN: [id number]    Location:  [facility] Place of Service: OFFICE    Advanced Directive information Does Patient Have a Medical Advance Directive?: Yes, Type of Advance Directive: Healthcare Power of [place];Living will, Does patient want to make changes to medical advance directive?: No - Patient declined  Chief Complaint  Patient presents with  . Establish Care    New patient to establish care    HPI:  81 yo female seen today as a new pt. She was admitted to the hospital 1/3rd-7th for left hemiparesis. CT head neg for acute process but showed chronic changes with old left cerebellar infarct. B12 level 301; TSH 6.336; free T4 level 1.2; albumin 3.3; INR 1.36; Hgb 10;  K+ 3.2-->4.3; Cr 0.53 at d/c. She was d/c'd on ASA/lovenox injections x 1 week + coumadin indefinitely  She reports no concerns today. Metformin occasionally causes GI upset. BS 80=90s at home.  Dementia - takes exelon patch  DM - on metformin. takes gabapentin for neuropathy. A1c 6.1%. FBS 80-90s.  Lymphedema - chronic b/l LE. She does not take diuretic. Tried TED stockings in the past but could not tolerate them.   Hx DVT/PE - on coumadin  Hyperlipidemia - takes lipitor  HTN - takes clonidine  Hypokalemia - takes Kdur  GERD - stable on dexilant  Hx CVA - she was not previously taking anticoagulation. On coumadin/ASA and lovenox   Past Medical History:  Diagnosis Date  . Dementia   . Diabetes mellitus type II 03/21/2011  . DVT (deep venous thrombosis) ([place]) 03/21/2011  . Gallstones   . Hyperlipidemia 03/21/2011  . Hypertension 03/21/2011  . Lymphedema of leg 03/21/2011  . Pulmonary embolism ([place]) 03/21/2011    Past Surgical History:  Procedure Laterality Date  . CHOLECYSTECTOMY  2015  . VAGINAL HYSTERECTOMY     unknown of date  . VASCULAR SURGERY      Patient Care Team: Corinna Lines, MD as PCP - General (Family  Medicine)  Social History   Social History  . Marital status: Widowed    Spouse name: N/A  . Number of children: N/A  . Years of education: N/A   Occupational History  . Not on file.   Social History Main Topics  . Smoking status: Never Smoker  . Smokeless tobacco: Never Used  . Alcohol use No  . Drug use: No  . Sexual activity: No   Other Topics Concern  . Not on file   Social History Narrative   Diet?       Do you drink/eat things with caffeine? occasional diet soda      Marital status?   Widowed/divorced                                 What year were you married? [place] you live in a house, apartment, assisted living, condo, trailer, etc.? home      Is it one or more stories? More (mainly living on one floor)      How many persons live in your home?      Do you have any pets in your home? (please list) no      Current or past profession: teacher      Do you exercise?       Physical therapy  Type & how often? 2-3 per week      Do you have a living will? yes      Do you have a DNR form?      ?                             If not, do you want to discuss one?      Do you have signed POA/HPOA for forms? yes        reports that she has never smoked. She has never used smokeless tobacco. She reports that she does not drink alcohol or use drugs.  Family History  Problem Relation Age of Onset  . Heart attack Mother   . COPD Sister   . Stroke Sister   . Bipolar disorder Daughter    Family Status  Relation Status  . Mother Deceased  . Father Deceased  . Sister Deceased  . Brother Deceased  . Sister Deceased  . Brother Deceased   murdered  . Daughter Alive  . Son Alive  . Son Alive     There is no immunization history on file for this patient.  No Known Allergies  Medications: Patient's Medications  New Prescriptions   No medications on file  Previous Medications   ACETAMINOPHEN (TYLENOL) 500 MG TABLET     Take 500 mg by mouth every 6 (six) hours as needed.   ASPIRIN 325 MG TABLET    Take 1 tablet (325 mg total) by mouth daily. Please discontinue this medication when you start taking the Coumadin   ATORVASTATIN (LIPITOR) 20 MG TABLET    Take 20 mg by mouth daily at 6 PM.    CLONIDINE (CATAPRES) 0.1 MG TABLET    Take 0.1 mg by mouth 2 (two) times daily.   DEXLANSOPRAZOLE (DEXILANT) 30 MG CAPSULE    Take 30 mg by mouth daily.   ENOXAPARIN (LOVENOX) 40 MG/0.4ML INJECTION    Inject 0.4 mLs (40 mg total) into the skin daily.   GABAPENTIN (NEURONTIN) 300 MG CAPSULE    Take 300 mg by mouth 3 (three) times daily.   METFORMIN (GLUCOPHAGE) 1000 MG TABLET    Take 1,000 mg by mouth 2 (two) times daily with a meal.   POTASSIUM CHLORIDE SA (K-DUR,KLOR-CON) 20 MEQ TABLET    Take 2 tablets (40 mEq total) by mouth daily.   RIVASTIGMINE (EXELON) 13.3 MG/24HR PT24    Place 1 patch onto the skin daily.   WARFARIN (COUMADIN) 2 MG TABLET    Take 1 tablet (2 mg total) by mouth daily at 6 PM.  Modified Medications   No medications on file  Discontinued Medications   No medications on file    Review of Systems  Unable to perform ROS: Dementia  Gastrointestinal: Positive for diarrhea (occasional).  Musculoskeletal: Positive for arthralgias (with stiff joints).  Psychiatric/Behavioral: Positive for confusion.       Memory loss  taken from new pt packet  Vitals:   05/18/16 1122  BP: (!) 162/88  Pulse: 76  Temp: 98.1 F (36.7 C)  TempSrc: Oral  SpO2: 98%   There is no height or weight on file to calculate BMI.  Physical Exam  Constitutional: She appears well-developed.  Frail appearing in NAD  HENT:  Mouth/Throat: Oropharynx is clear and moist. No oropharyngeal exudate.  Eyes: Pupils are equal, round, and reactive to light. No scleral icterus.  Neck: Neck supple. Carotid bruit is not present.  No tracheal deviation present. No thyromegaly present.  Cardiovascular: Normal rate, regular rhythm and intact  distal pulses.  Exam reveals no gallop and no friction rub.   Murmur (1/6 SEM) heard. +1 pitting LLE edema; trace RLE edema; no calf TTP  Pulmonary/Chest: Effort normal and breath sounds normal. No stridor. No respiratory distress. She has no wheezes. She has no rales.  Abdominal: Soft. Bowel sounds are normal. She exhibits no distension and no mass. There is no hepatomegaly. There is no tenderness. There is no rebound and no guarding.  Musculoskeletal: She exhibits edema and deformity.  Lymphadenopathy:    She has no cervical adenopathy.  Neurological: She is alert.  Skin: Skin is warm and dry. No rash noted.  Psychiatric: She has a normal mood and affect. Her behavior is normal. Thought content normal.     Labs reviewed: Office Visit on 05/18/2016  Component Date Value Ref Range Status  . INR 03/30/2016 1.1  0.9 - 1.1 Final  . Hemoglobin 03/30/2016 10.8* 12.0 - 16.0 g/dL Final  . HCT 03/30/2016 34* 36 - 46 % Final  . Platelets 03/30/2016 215  150 - 399 K/L Final  . WBC 03/30/2016 4.7  10^3/mL Final  . Protime 03/30/2016 11.6  10.0 - 13.8 seconds Final  . Glucose 03/30/2016 140  mg/dL Final  . BUN 03/30/2016 15  4 - 21 mg/dL Final  . Creatinine 03/30/2016 0.7  0.5 - 1.1 mg/dL Final  . Potassium 03/30/2016 4.2  3.4 - 5.3 mmol/L Final  . Sodium 03/30/2016 142  137 - 147 mmol/L Final  . Triglycerides 03/30/2016 123  40 - 160 mg/dL Final  . Cholesterol 03/30/2016 143  0 - 200 mg/dL Final  . HDL 03/30/2016 51  35 - 70 mg/dL Final  . LDL Cholesterol 03/30/2016 80  mg/dL Final  . Alkaline Phosphatase 03/30/2016 62  25 - 125 U/L Final  . ALT 03/30/2016 14  7 - 35 U/L Final  . AST 03/30/2016 15  13 - 35 U/L Final  . Bilirubin, Total 03/30/2016 0.1  mg/dL Final  . Hemoglobin A1C 03/30/2016 6.7   Final  . TSH 03/30/2016 2.72  0.41 - 5.90 uIU/mL Final  Admission on 05/11/2016, Discharged on 05/15/2016  No results displayed because visit has over 200 results.    Admission on 03/08/2016,  Discharged on 03/08/2016  Component Date Value Ref Range Status  . WBC 03/08/2016 6.1  4.0 - 10.5 K/uL Final  . RBC 03/08/2016 3.68* 3.87 - 5.11 MIL/uL Final  . Hemoglobin 03/08/2016 10.6* 12.0 - 15.0 g/dL Final  . HCT 03/08/2016 33.6* 36.0 - 46.0 % Final  . MCV 03/08/2016 91.3  78.0 - 100.0 fL Final  . MCH 03/08/2016 28.8  26.0 - 34.0 pg Final  . MCHC 03/08/2016 31.5  30.0 - 36.0 g/dL Final  . RDW 03/08/2016 14.0  11.5 - 15.5 % Final  . Platelets 03/08/2016 253  150 - 400 K/uL Final  . Neutrophils Relative % 03/08/2016 62  % Final  . Neutro Abs 03/08/2016 3.8  1.7 - 7.7 K/uL Final  . Lymphocytes Relative 03/08/2016 24  % Final  . Lymphs Abs 03/08/2016 1.5  0.7 - 4.0 K/uL Final  . Monocytes Relative 03/08/2016 13  % Final  . Monocytes Absolute 03/08/2016 0.8  0.1 - 1.0 K/uL Final  . Eosinophils Relative 03/08/2016 1  % Final  . Eosinophils Absolute 03/08/2016 0.1  0.0 - 0.7 K/uL Final  . Basophils Relative 03/08/2016 0  % Final  .  Basophils Absolute 03/08/2016 0.0  0.0 - 0.1 K/uL Final  . Sodium 03/08/2016 140  135 - 145 mmol/L Final  . Potassium 03/08/2016 5.0  3.5 - 5.1 mmol/L Final  . Chloride 03/08/2016 104  101 - 111 mmol/L Final  . CO2 03/08/2016 26  22 - 32 mmol/L Final  . Glucose, Bld 03/08/2016 123* 65 - 99 mg/dL Final  . BUN 03/08/2016 11  6 - 20 mg/dL Final  . Creatinine, Ser 03/08/2016 0.52  0.44 - 1.00 mg/dL Final  . Calcium 03/08/2016 8.4* 8.9 - 10.3 mg/dL Final  . Total Protein 03/08/2016 5.5* 6.5 - 8.1 g/dL Final  . Albumin 03/08/2016 2.5* 3.5 - 5.0 g/dL Final  . AST 03/08/2016 30  15 - 41 U/L Final  . ALT 03/08/2016 17  14 - 54 U/L Final  . Alkaline Phosphatase 03/08/2016 49  38 - 126 U/L Final  . Total Bilirubin 03/08/2016 1.4* 0.3 - 1.2 mg/dL Final  . GFR calc non Af Amer 03/08/2016 >60  >60 mL/min Final  . GFR calc Af Amer 03/08/2016 >60  >60 mL/min Final   Comment: (NOTE) The eGFR has been calculated using the CKD EPI equation. This calculation has not been  validated in all clinical situations. eGFR's persistently <60 mL/min signify possible Chronic Kidney Disease.   . Anion gap 03/08/2016 10  5 - 15 Final  . Total CK 03/08/2016 592* 38 - 234 U/L Final  . Prothrombin Time 03/08/2016 16.7* 11.4 - 15.2 seconds Final  . INR 03/08/2016 1.35   Final    Dg Chest 2 View  Result Date: 05/11/2016 CLINICAL DATA:  Left-sided weakness and slurred speech beginning today. EXAM: CHEST  2 VIEW COMPARISON:  Single-view of the chest 03/08/2016. FINDINGS: The lungs are clear. Heart size is upper normal. No pneumothorax or pleural effusion. Atherosclerosis noted. Thoracolumbar scoliosis is also seen. IMPRESSION: No acute disease. Atherosclerosis. Electronically Signed   By: Inge Rise M.D.   On: 05/11/2016 20:00   Ct Head Wo Contrast  Result Date: 05/11/2016 CLINICAL DATA:  Left-sided weakness with drooling and slurred speech. EXAM: CT HEAD WITHOUT CONTRAST TECHNIQUE: Contiguous axial images were obtained from the base of the skull through the vertex without intravenous contrast. COMPARISON:  None. FINDINGS: Brain: Moderate degree of periventricular and subcortical white matter hypodensity consistent with small vessel ischemic disease likely chronic. Moderate ventriculomegaly consistent with central atrophy. Chronic left cerebellar infarct. No acute intracranial hemorrhage, midline shift or edema. No extra-axial fluid collection. No effacement of the basal cisterns or fourth ventricle. No acute large vascular territory Vascular: No hyperdense vessels. Calcifications of both carotid siphons. Skull: Nonacute Sinuses/Orbits: Symmetric appearing orbits. Clear mastoids. The visualized paranasal sinuses are unremarkable. Other: None IMPRESSION: Moderate degree of periventricular and subcortical white matter hypodensity likely reflecting chronic small vessel ischemic disease. Bilateral carotid siphon calcifications. Central atrophy. No acute intracranial hemorrhage, midline  shift nor large vascular territory infarction. Chronic left cerebellar infarct. Electronically Signed   By: Ashley Royalty M.D.   On: 05/11/2016 16:56   Mr Brain Wo Contrast  Result Date: 05/12/2016 CLINICAL DATA:  81 year old female with left arm weakness left facial droop drooling and slurred speech. Agitated. Initial encounter. EXAM: MRI HEAD WITHOUT CONTRAST TECHNIQUE: Multiplanar, multiecho pulse sequences of the brain and surrounding structures were obtained without intravenous contrast. COMPARISON:  Head CT without contrast 05/11/2016. FINDINGS: The examination had to be discontinued prior to completion due to patient agitation. Only axial diffusion weighted imaging could be obtained. No restricted diffusion  or evidence of acute infarction. Evidence of encephalomalacia in the left cerebellum as seen on CT. Facilitated diffusion throughout the bilateral cerebral white matter compatible with chronic white matter changes. Stable ventricle size and configuration. No midline shift, mass effect, or evidence of intracranial mass lesion. IMPRESSION: 1. No evidence of acute infarct. 2. The examination had to be discontinued prior to completion due to patient agitation. Electronically Signed   By: Genevie Ann M.D.   On: 05/12/2016 15:46     Assessment/Plan   ICD-9-CM ICD-10-CM   1. Type 2 diabetes mellitus with diabetic polyneuropathy, without long-term current use of insulin (HCC) 250.60 E11.42 metFORMIN (GLUCOPHAGE) 500 MG tablet   357.2  BMP with eGFR  2. Essential hypertension 401.9 I10   3. Lymphedema of left lower extremity 457.1 I89.0   4. Mixed hyperlipidemia 272.2 E78.2 Lipid Panel  5. History of pulmonary embolism V12.55 Z86.711   6. History of DVT (deep vein thrombosis) V12.51 Z86.718   7. Transient cerebral ischemia, unspecified type 435.9 G45.9   8. Cerebrovascular disease 437.9 I67.9   9. Borderline abnormal TFTs 794.5 R94.6 TSH     T4, Free   Reduce metformin to 567m daily due to diarrhea.  She has well controlled sugars at home  Check blood sugars fasting daily. GOAL <150. Bring blood sugar diary to next appt  Recommend follow up with Hematology 1/24th with Dr EMartha Clanto determine if she is a candidate for eliquis vs xeralto instead of coumadin for DVT/PE history and recent TIA  Continue current medications as ordered  Check blood pressure 1hr after medication daily and bring diary to next visit. GOAL <160/90  GOAL INR 2-3 for now. INR to be drawn by home health (due next week)  Follow up in 1-2 mos for CPE/AWV. Fasting labs prior to appt  MSomonaukS. CPerlie Gold PSurg[facility] [address] [phone] (Monday-Friday 8 AM - 5 PM) ([phone] 5 PM and follow prompts

## 2016-05-21 DIAGNOSIS — R627 Adult failure to thrive: Secondary | ICD-10-CM | POA: Diagnosis not present

## 2016-05-21 DIAGNOSIS — Z86718 Personal history of other venous thrombosis and embolism: Secondary | ICD-10-CM | POA: Diagnosis not present

## 2016-05-21 DIAGNOSIS — Z7901 Long term (current) use of anticoagulants: Secondary | ICD-10-CM | POA: Diagnosis not present

## 2016-05-21 DIAGNOSIS — E119 Type 2 diabetes mellitus without complications: Secondary | ICD-10-CM | POA: Diagnosis not present

## 2016-05-21 DIAGNOSIS — E785 Hyperlipidemia, unspecified: Secondary | ICD-10-CM | POA: Diagnosis not present

## 2016-05-21 DIAGNOSIS — Z86711 Personal history of pulmonary embolism: Secondary | ICD-10-CM | POA: Diagnosis not present

## 2016-05-21 DIAGNOSIS — Z8673 Personal history of transient ischemic attack (TIA), and cerebral infarction without residual deficits: Secondary | ICD-10-CM | POA: Diagnosis not present

## 2016-05-21 DIAGNOSIS — F0390 Unspecified dementia without behavioral disturbance: Secondary | ICD-10-CM | POA: Diagnosis not present

## 2016-05-21 DIAGNOSIS — Z5181 Encounter for therapeutic drug level monitoring: Secondary | ICD-10-CM | POA: Diagnosis not present

## 2016-05-21 DIAGNOSIS — I10 Essential (primary) hypertension: Secondary | ICD-10-CM | POA: Diagnosis not present

## 2016-05-23 ENCOUNTER — Telehealth: Payer: Self-pay | Admitting: *Deleted

## 2016-05-23 DIAGNOSIS — R627 Adult failure to thrive: Secondary | ICD-10-CM | POA: Diagnosis not present

## 2016-05-23 DIAGNOSIS — Z8673 Personal history of transient ischemic attack (TIA), and cerebral infarction without residual deficits: Secondary | ICD-10-CM | POA: Diagnosis not present

## 2016-05-23 DIAGNOSIS — I10 Essential (primary) hypertension: Secondary | ICD-10-CM | POA: Diagnosis not present

## 2016-05-23 DIAGNOSIS — E785 Hyperlipidemia, unspecified: Secondary | ICD-10-CM | POA: Diagnosis not present

## 2016-05-23 DIAGNOSIS — F0390 Unspecified dementia without behavioral disturbance: Secondary | ICD-10-CM | POA: Diagnosis not present

## 2016-05-23 DIAGNOSIS — E119 Type 2 diabetes mellitus without complications: Secondary | ICD-10-CM | POA: Diagnosis not present

## 2016-05-23 NOTE — Telephone Encounter (Signed)
Holly with [facility] notified and she will notify daughter.

## 2016-05-23 NOTE — Telephone Encounter (Signed)
Continue coumadin at current dose; repeat INR in 1 week

## 2016-05-23 NOTE — Telephone Encounter (Signed)
Holly with [facility] called with patient's INR Results.  INR:  2.6  Stated patient is currently taking 6mg  of Coumadin daily. Please Advise.

## 2016-05-25 DIAGNOSIS — E119 Type 2 diabetes mellitus without complications: Secondary | ICD-10-CM | POA: Diagnosis not present

## 2016-05-25 DIAGNOSIS — F0390 Unspecified dementia without behavioral disturbance: Secondary | ICD-10-CM | POA: Diagnosis not present

## 2016-05-25 DIAGNOSIS — Z8673 Personal history of transient ischemic attack (TIA), and cerebral infarction without residual deficits: Secondary | ICD-10-CM | POA: Diagnosis not present

## 2016-05-25 DIAGNOSIS — R627 Adult failure to thrive: Secondary | ICD-10-CM | POA: Diagnosis not present

## 2016-05-25 DIAGNOSIS — E785 Hyperlipidemia, unspecified: Secondary | ICD-10-CM | POA: Diagnosis not present

## 2016-05-25 DIAGNOSIS — I10 Essential (primary) hypertension: Secondary | ICD-10-CM | POA: Diagnosis not present

## 2016-05-26 DIAGNOSIS — I10 Essential (primary) hypertension: Secondary | ICD-10-CM | POA: Diagnosis not present

## 2016-05-26 DIAGNOSIS — E119 Type 2 diabetes mellitus without complications: Secondary | ICD-10-CM | POA: Diagnosis not present

## 2016-05-26 DIAGNOSIS — F0390 Unspecified dementia without behavioral disturbance: Secondary | ICD-10-CM | POA: Diagnosis not present

## 2016-05-26 DIAGNOSIS — R627 Adult failure to thrive: Secondary | ICD-10-CM | POA: Diagnosis not present

## 2016-05-26 DIAGNOSIS — E785 Hyperlipidemia, unspecified: Secondary | ICD-10-CM | POA: Diagnosis not present

## 2016-05-26 DIAGNOSIS — Z8673 Personal history of transient ischemic attack (TIA), and cerebral infarction without residual deficits: Secondary | ICD-10-CM | POA: Diagnosis not present

## 2016-05-30 ENCOUNTER — Telehealth: Payer: Self-pay | Admitting: *Deleted

## 2016-05-30 DIAGNOSIS — R627 Adult failure to thrive: Secondary | ICD-10-CM | POA: Diagnosis not present

## 2016-05-30 DIAGNOSIS — Z8673 Personal history of transient ischemic attack (TIA), and cerebral infarction without residual deficits: Secondary | ICD-10-CM | POA: Diagnosis not present

## 2016-05-30 DIAGNOSIS — F0390 Unspecified dementia without behavioral disturbance: Secondary | ICD-10-CM | POA: Diagnosis not present

## 2016-05-30 DIAGNOSIS — E785 Hyperlipidemia, unspecified: Secondary | ICD-10-CM | POA: Diagnosis not present

## 2016-05-30 DIAGNOSIS — E119 Type 2 diabetes mellitus without complications: Secondary | ICD-10-CM | POA: Diagnosis not present

## 2016-05-30 DIAGNOSIS — I10 Essential (primary) hypertension: Secondary | ICD-10-CM | POA: Diagnosis not present

## 2016-05-30 LAB — POCT INR: INR: 2.5 — AB (ref 0.9–1.1)

## 2016-05-30 NOTE — Telephone Encounter (Signed)
[name] with [facility] called with patient's INR  INR:  2.5  Current Dose of Coumadin: 6mg  daily. Please advise.   Previous 05/23/16-  2.6

## 2016-05-30 NOTE — Telephone Encounter (Signed)
INR is therapeutic. Continue coumadin dose as ordered. Reck PT/INR in 1 week.  GOAL INR 2-3 for DVT/PE, hx CVA. Please d/c lovenox if not done so already

## 2016-05-30 NOTE — Telephone Encounter (Signed)
[name] with [facility] notified and agreed. Patient has D/C Lovenox.

## 2016-05-31 DIAGNOSIS — F0390 Unspecified dementia without behavioral disturbance: Secondary | ICD-10-CM | POA: Diagnosis not present

## 2016-05-31 DIAGNOSIS — I10 Essential (primary) hypertension: Secondary | ICD-10-CM | POA: Diagnosis not present

## 2016-05-31 DIAGNOSIS — R627 Adult failure to thrive: Secondary | ICD-10-CM | POA: Diagnosis not present

## 2016-05-31 DIAGNOSIS — E785 Hyperlipidemia, unspecified: Secondary | ICD-10-CM | POA: Diagnosis not present

## 2016-05-31 DIAGNOSIS — Z8673 Personal history of transient ischemic attack (TIA), and cerebral infarction without residual deficits: Secondary | ICD-10-CM | POA: Diagnosis not present

## 2016-05-31 DIAGNOSIS — E119 Type 2 diabetes mellitus without complications: Secondary | ICD-10-CM | POA: Diagnosis not present

## 2016-05-31 NOTE — Discharge Summary (Signed)
Physician Discharge Summary  [name] [id number] DOB: 03-19-1936 DOA: 05/11/2016  PCP: Corinna Lines, MD  Admit date: 05/11/2016 Discharge date: 05/31/2016  Time spent: 35 minutes  Recommendations for Outpatient Follow-up:  1. Patient will need bridge of Lovenox to Coumadin and an INR level of 2 to 3 prior to transition to just Coumadin 2. Patient should follow up with stroke service as an outpatient 3. Lovenox teaching has been performed by RN and family is comfortable taking her home and doing the bridging 4. Home health PT OT has been ordered as well as RN to do the Coumadin checks and this can be managed by her primary physician 5. Patient will need progressive lowering of the pressure as an outpatient   Discharge Diagnoses:  Principal Problem:   Left-sided weakness Active Problems:   History of pulmonary embolism   History of DVT (deep vein thrombosis)   Diabetes mellitus type II, non insulin dependent (HCC)   Hyperlipidemia   Hypertension   Dysarthria   Acute left-sided weakness   Acute CVA (cerebrovascular accident) [facility])   Discharge Condition: Improved  Diet recommendation: Heart healthy low-salt  Filed Weights   05/11/16 1609 05/11/16 2130  Weight: 54.4 kg (120 lb) 50 kg (110 lb 3.2 oz)    81 y.o.? 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  noted the patient's left arm to be flaccid with a left facial droop, drooling, and slurred speech. There is been no recent fall or trauma, no alcohol or illicit substance use, and no recent changes to her medications.  Patient is managed with Coumadin for a history of remote DVT and PE, but her daughter-in-law notes that INR has been only slightly above 1 on consecutive recent measurements, but  her clinician did not want to increase her dose secondary to fall risk.      Right brain TIA secondary to hypercoagulable state Left-sided weakness, dysarthria - Head CT negative for acute intracranial abnormality - Neurology consult appreciated-have s/o - No tPA given resolved sxs and unclear LKW   - PT/OT recommending home health 24 hour supervision - have been unable to get MRI/MRA - carotid US shows no significant stenosis -TTE EF 60-65% -A1c 6.1 -fasting lipids  - increase warfarin to therapeutic levels-- lovenox until INR >2-currently 1.36 on discharge home, I'm discharging with Coumadin 2 mg tablets -Spoken to family and encouraged teaching of Lovenox injections for the Bridge as she will be going home with daughter-in-law and son and we can probably be discharged her home 05/15/2016 if family is comfortable with this plan -Home health to draw INR level 05/17/16 and a follow-up level 05/23/16 -1 week Lovenox given - Permissive HTN to 210/110 in acute phase--blood pressures improved slightly to 150s over 70s, 130/65   Hx of DVT and PE - Pt has remote hx of DVT and PE and has been managed with coumadin  - INR has been just over 1 on consecutive recent measurements, but per family, dose was not increased d/t concerns regarding falls  - She has been bedbound since last summer ? no longer present a fall-risk  - Neurology has advised increasing coumadin to therapeutic levels -Discharged home with Lovenox to Coumadin bridge   Type II DM , A1c 6.1 indicating good control - Managed with metformin only at  home; this will be held  - SSI, lunch sugar ranges have been 98-143   Hypertension  - BP markedly elevated in ED - She is managed with clonidine at home; held on admission and resumed on 05/13/2016 is no acute CVA found - Plan to permit hypertension to 210/110 in acute phase of suspected TIA/CVA; labetalol IVPs available prn       Hyperlipidemia  - Continue Lipitor, increased to 40  mg qHS  - Fasting lipid panel pending    Generalized weakness, failure to thrive - Pt has been bedbound since last summer and moved here from [place] at that time so that local children could help care for her  - Unclear etiology for this; will check thyroid studies: TSH is 6.336--T4 is 1.21--I doubt these results and will repeat both labs today 05/13/16 and they are within normal limits and appropriate so would not change management, B12 301 , folate normal    Consultations:  Nerology  Discharge Exam: Vitals:   05/15/16 0954 05/15/16 1300  BP: (!) 166/66 (!) 169/75  Pulse: 73 80  Resp: 17 18  Temp: 98.7 F (37.1 C) 98.9 F (37.2 C)    Was to go visit her mother thinks that she is sick-patient is oriented  Alert otherwise pleasant Chest clinically clear Moving upper extremities smile symmetric  Discharge Instructions   Discharge Instructions    Diet - low sodium heart healthy    Complete by:  As directed    Discharge instructions    Complete by:  As directed    Please continue the Lovenox shots as well as aspirin until you are checked by home health RN with labs 05/17/2016 at home and told by home health nurse that the Coumadin is therapeutic and then you can continue only the Coumadin  get your Coumadin level (INR) checked one more time about 7 days from now 05/23/16 if you're levels are between 2 and 3 you're primary care physician can monitor We will give you one week supply of Lovenox irrespective so that you can bridge safely to Coumadin with an INR level between 2 and 3 Please follow-up with the stroke service at your convenience   Increase activity slowly    Complete by:  As directed      Discharge Medication List as of 05/15/2016 12:50 PM    START taking these medications   Details  aspirin 325 MG tablet Take 1 tablet (325 mg total) by mouth daily. Please discontinue this medication when you start taking the Coumadin, Starting Sun 05/15/2016, OTC    enoxaparin  (LOVENOX) 40 MG/0.4ML injection Inject 0.4 mLs (40 mg total) into the skin daily., Starting Sun 05/15/2016, Print    potassium chloride SA (K-DUR,KLOR-CON) 20 MEQ tablet Take 2 tablets (40 mEq total) by mouth daily., Starting Sun 05/15/2016, Normal      CONTINUE these medications which have CHANGED   Details  warfarin (COUMADIN) 2 MG tablet Take 1 tablet (2 mg total) by mouth daily at 6 PM., Starting Sun 05/15/2016, Normal      CONTINUE these medications which have NOT CHANGED   Details  atorvastatin (LIPITOR) 20 MG tablet Take 20 mg by mouth daily at 6 PM. , Historical Med    cloNIDine (CATAPRES) 0.1 MG tablet Take 0.1 mg by mouth 2 (two) times daily., Historical Med    Dexlansoprazole (DEXILANT) 30 MG capsule Take 30 mg by mouth daily., Historical Med    gabapentin (NEURONTIN) 300 MG capsule Take 300 mg by  mouth 3 (three) times daily., Historical Med    Rivastigmine (EXELON) 13.3 MG/24HR PT24 Place 1 patch onto the skin daily., Historical Med    metFORMIN (GLUCOPHAGE) 1000 MG tablet Take 1,000 mg by mouth 2 (two) times daily with a meal., Historical Med       No Known Allergies Follow-up Information    [facility].   Why:  home health physical therapy and nurse Contact information: [address] [place] [address] [phone]            The results of significant diagnostics from this hospitalization (including imaging, microbiology, ancillary and laboratory) are listed below for reference.    Significant Diagnostic Studies: Dg Chest 2 View  Result Date: 05/11/2016 CLINICAL DATA:  Left-sided weakness and slurred speech beginning today. EXAM: CHEST  2 VIEW COMPARISON:  Single-view of the chest 03/08/2016. FINDINGS: The lungs are clear. Heart size is upper normal. No pneumothorax or pleural effusion. Atherosclerosis noted. Thoracolumbar scoliosis is also seen. IMPRESSION: No acute disease. Atherosclerosis. Electronically Signed   By:  Inge Rise M.D.   On: 05/11/2016 20:00   Ct Head Wo Contrast  Result Date: 05/11/2016 CLINICAL DATA:  Left-sided weakness with drooling and slurred speech. EXAM: CT HEAD WITHOUT CONTRAST TECHNIQUE: Contiguous axial images were obtained from the base of the skull through the vertex without intravenous contrast. COMPARISON:  None. FINDINGS: Brain: Moderate degree of periventricular and subcortical white matter hypodensity consistent with small vessel ischemic disease likely chronic. Moderate ventriculomegaly consistent with central atrophy. Chronic left cerebellar infarct. No acute intracranial hemorrhage, midline shift or edema. No extra-axial fluid collection. No effacement of the basal cisterns or fourth ventricle. No acute large vascular territory Vascular: No hyperdense vessels. Calcifications of both carotid siphons. Skull: Nonacute Sinuses/Orbits: Symmetric appearing orbits. Clear mastoids. The visualized paranasal sinuses are unremarkable. Other: None IMPRESSION: Moderate degree of periventricular and subcortical white matter hypodensity likely reflecting chronic small vessel ischemic disease. Bilateral carotid siphon calcifications. Central atrophy. No acute intracranial hemorrhage, midline shift nor large vascular territory infarction. Chronic left cerebellar infarct. Electronically Signed   By: Ashley Royalty M.D.   On: 05/11/2016 16:56   Mr Brain Wo Contrast  Result Date: 05/12/2016 CLINICAL DATA:  81 year old female with left arm weakness left facial droop drooling and slurred speech. Agitated. Initial encounter. EXAM: MRI HEAD WITHOUT CONTRAST TECHNIQUE: Multiplanar, multiecho pulse sequences of the brain and surrounding structures were obtained without intravenous contrast. COMPARISON:  Head CT without contrast 05/11/2016. FINDINGS: The examination had to be discontinued prior to completion due to patient agitation. Only axial diffusion weighted imaging could be obtained. No restricted diffusion  or evidence of acute infarction. Evidence of encephalomalacia in the left cerebellum as seen on CT. Facilitated diffusion throughout the bilateral cerebral white matter compatible with chronic white matter changes. Stable ventricle size and configuration. No midline shift, mass effect, or evidence of intracranial mass lesion. IMPRESSION: 1. No evidence of acute infarct. 2. The examination had to be discontinued prior to completion due to patient agitation. Electronically Signed   By: Genevie Ann M.D.   On: 05/12/2016 15:46    Microbiology: No results found for this or any previous visit (from the past 240 hour(s)).   Labs: Basic Metabolic Panel: No results for input(s): NA, K, CL, CO2, GLUCOSE, BUN, CREATININE, CALCIUM, MG, PHOS in the last 168 hours. Liver Function Tests: No results for input(s): AST, ALT, ALKPHOS, BILITOT, PROT, ALBUMIN in the last 168 hours. No results for  input(s): LIPASE, AMYLASE in the last 168 hours. No results for input(s): AMMONIA in the last 168 hours. CBC: No results for input(s): WBC, NEUTROABS, HGB, HCT, MCV, PLT in the last 168 hours. Cardiac Enzymes: No results for input(s): CKTOTAL, CKMB, CKMBINDEX, TROPONINI in the last 168 hours. BNP: BNP (last 3 results) No results for input(s): BNP in the last 8760 hours.  ProBNP (last 3 results) No results for input(s): PROBNP in the last 8760 hours.  CBG: No results for input(s): GLUCAP in the last 168 hours.     SignedNita Sells MD   [facility] 05/31/2016, 2:33 PM

## 2016-06-01 ENCOUNTER — Other Ambulatory Visit: Payer: Self-pay | Admitting: *Deleted

## 2016-06-01 ENCOUNTER — Other Ambulatory Visit (HOSPITAL_BASED_OUTPATIENT_CLINIC_OR_DEPARTMENT_OTHER): Payer: Medicare Other

## 2016-06-01 ENCOUNTER — Ambulatory Visit: Payer: Medicare Other

## 2016-06-01 ENCOUNTER — Ambulatory Visit (HOSPITAL_BASED_OUTPATIENT_CLINIC_OR_DEPARTMENT_OTHER): Payer: Medicare Other | Admitting: Hematology & Oncology

## 2016-06-01 ENCOUNTER — Encounter: Payer: Self-pay | Admitting: Hematology & Oncology

## 2016-06-01 VITALS — BP 185/96 | HR 76 | Temp 98.6°F | Resp 18

## 2016-06-01 DIAGNOSIS — Z7901 Long term (current) use of anticoagulants: Secondary | ICD-10-CM

## 2016-06-01 DIAGNOSIS — Z86718 Personal history of other venous thrombosis and embolism: Secondary | ICD-10-CM

## 2016-06-01 DIAGNOSIS — I639 Cerebral infarction, unspecified: Secondary | ICD-10-CM

## 2016-06-01 LAB — CBC WITH DIFFERENTIAL (CANCER CENTER ONLY)
BASO#: 0 10*3/uL (ref 0.0–0.2)
BASO%: 0.8 % (ref 0.0–2.0)
EOS%: 0.8 % (ref 0.0–7.0)
Eosinophils Absolute: 0 10*3/uL (ref 0.0–0.5)
HEMATOCRIT: 35.2 % (ref 34.8–46.6)
HGB: 11 g/dL — ABNORMAL LOW (ref 11.6–15.9)
LYMPH#: 1.4 10*3/uL (ref 0.9–3.3)
LYMPH%: 39.4 % (ref 14.0–48.0)
MCH: 29.3 pg (ref 26.0–34.0)
MCHC: 31.3 g/dL — ABNORMAL LOW (ref 32.0–36.0)
MCV: 94 fL (ref 81–101)
MONO#: 0.5 10*3/uL (ref 0.1–0.9)
MONO%: 12.7 % (ref 0.0–13.0)
NEUT#: 1.7 10*3/uL (ref 1.5–6.5)
NEUT%: 46.3 % (ref 39.6–80.0)
PLATELETS: 214 10*3/uL (ref 145–400)
RBC: 3.76 10*6/uL (ref 3.70–5.32)
RDW: 12.9 % (ref 11.1–15.7)
WBC: 3.6 10*3/uL — AB (ref 3.9–10.0)

## 2016-06-01 LAB — CMP (CANCER CENTER ONLY)
ALT(SGPT): 25 U/L (ref 10–47)
AST: 19 U/L (ref 11–38)
Albumin: 3 g/dL — ABNORMAL LOW (ref 3.3–5.5)
Alkaline Phosphatase: 64 U/L (ref 26–84)
BUN: 21 mg/dL (ref 7–22)
CHLORIDE: 103 meq/L (ref 98–108)
CO2: 27 mEq/L (ref 18–33)
Calcium: 9.2 mg/dL (ref 8.0–10.3)
Creat: 0.7 mg/dl (ref 0.6–1.2)
GLUCOSE: 279 mg/dL — AB (ref 73–118)
POTASSIUM: 4.1 meq/L (ref 3.3–4.7)
SODIUM: 140 meq/L (ref 128–145)
Total Bilirubin: 0.6 mg/dl (ref 0.20–1.60)
Total Protein: 6.9 g/dL (ref 6.4–8.1)

## 2016-06-01 LAB — PROTIME-INR (CHCC SATELLITE)
INR: 2.5 (ref 2.0–3.5)
Protime: 30 Seconds — ABNORMAL HIGH (ref 10.6–13.4)

## 2016-06-01 MED ORDER — RIVAROXABAN 15 MG PO TABS: 15.0000 mg | ORAL_TABLET | Freq: Every day | ORAL | 12 refills | Status: DC

## 2016-06-01 MED ORDER — RIVAROXABAN 15 MG PO TABS: 15.0000 mg | ORAL_TABLET | Freq: Every day | ORAL | 3 refills | Status: DC

## 2016-06-01 NOTE — Progress Notes (Signed)
Referral MD  Reason for Referral: TIA/RIND of the right brain. Remote history of pulmonary embolism and lower extremity thrombus.   Chief Complaint  Patient presents with  . Follow-up  : Patient really cannot give much history.  HPI: [name] is a 81 year old African-American female. She looks much older. I have not seen her for about 5 years. The town that I saw her, she was referred because of past history of pulmonary embolism and thromboembolic disease of the right leg. I think this probably was about 20 years ago. At that time, she was on estrogens.  She had been on Coumadin. However, this really has not been monitored all that well. She's been traveling back and forth from [place].  She was admitted to [facility] in early January. She has some left-sided weakness. She had an MRI which did not show any acute infarct. The patient cannot complete the exam because of agitation. A prior CT scan done on January 3 not show any obvious acute bleeding or thrombotic event.  Her INR when she was admitted was sub-therapeutic.  She is placed on aspirin 325 mg. Neurology saw her. They felt that she can be taken off aspirin and maintained on therapeutic Coumadin.  She was discharged on generous seventh. She was told to follow-up with our office for anticoagulation management.  She is still on aspirin. She is on Coumadin. Her daughter wants know if something else can be tried beside Coumadin as it is very hard to get her to offices for Coumadin checks.  She is living with her daughter. She basically is either in bed or in a chair. She had been getting home physical therapy. She has a limited fall risk.  She seems to be eating okay. She has no dysphagia or odynophagia.  She really does not talk all that much. Her voice is a little bit low.  I think she apparently is seeing a new doctor who wanted her to be seen by our office.  Overall, her performance status is ECOG 3.    Past  Medical History:  Diagnosis Date  . Dementia   . Diabetes mellitus type II 03/21/2011  . DVT (deep venous thrombosis) ([place]) 03/21/2011  . Gallstones   . Hyperlipidemia 03/21/2011  . Hypertension 03/21/2011  . Lymphedema of leg 03/21/2011  . Pulmonary embolism ([place]) 03/21/2011  :  Past Surgical History:  Procedure Laterality Date  . CHOLECYSTECTOMY  2015  . VAGINAL HYSTERECTOMY     unknown of date  . VASCULAR SURGERY    :   Current Outpatient Prescriptions:  .  acetaminophen (TYLENOL) 500 MG tablet, Take 500 mg by mouth every 6 (six) hours as needed., Disp: , Rfl:  .  atorvastatin (LIPITOR) 20 MG tablet, Take 20 mg by mouth daily at 6 PM. , Disp: , Rfl:  .  cloNIDine (CATAPRES) 0.1 MG tablet, Take 0.1 mg by mouth 2 (two) times daily., Disp: , Rfl:  .  Dexlansoprazole (DEXILANT) 30 MG capsule, Take 30 mg by mouth daily., Disp: , Rfl:  .  gabapentin (NEURONTIN) 300 MG capsule, Take 300 mg by mouth 3 (three) times daily., Disp: , Rfl:  .  metFORMIN (GLUCOPHAGE) 500 MG tablet, Take 1 tablet (500 mg total) by mouth daily with breakfast., Disp: 90 tablet, Rfl: 1 .  Rivaroxaban (XARELTO) 15 MG TABS tablet, Take 1 tablet (15 mg total) by mouth daily with supper. 90 day supply, Disp: 90 tablet, Rfl: 3 .  Rivastigmine (EXELON) 13.3 MG/24HR PT24, Place  1 patch onto the skin daily., Disp: , Rfl: :  :  No Known Allergies:  Family History  Problem Relation Age of Onset  . Heart attack Mother   . COPD Sister   . Stroke Sister   . Bipolar disorder Daughter   :  Social History   Social History  . Marital status: Widowed    Spouse name: N/A  . Number of children: N/A  . Years of education: N/A   Occupational History  . Not on file.   Social History Main Topics  . Smoking status: Never Smoker  . Smokeless tobacco: Never Used  . Alcohol use No  . Drug use: No  . Sexual activity: No   Other Topics Concern  . Not on file   Social History Narrative   Diet?       Do you  drink/eat things with caffeine? occasional diet soda      Marital status?   Widowed/divorced                                 What year were you married? [place]ight[place] you live in a house, apartment, assisted living, condo, trailer, etc.? home      Is it one or more stories? More (mainly living on one floor)      How many persons live in your home?      Do you have any pets in your home? (please list) no      Current or past profession: teacher      Do you exercise?       Physical therapy                              Type & how often? 2-3 per week      Do you have a living will? yes      Do you have a DNR form?      ?                             If not, do you want to discuss one?      Do you have signed POA/HPOA for forms? yes     :  Pertinent items are noted in HPI.  Exam: @IPVITALS @ Chronically ill-appearing African-American female in no obvious distress. Her vital signs show a temperature of 98.6. Pulse 76. Blood pressure 185/96. Head neck exam shows no ocular or oral lesions. She has no scleral icterus. She has no adenopathy in the neck. She does have a good gag reflex. Lungs are with some decrease at the bases. She has decent air movement bilaterally. Cardiac exam regular rate and rhythm with occasional extra beat. She has a 1/6 systolic ejection murmur. Abdomen is soft. She has decent bowel sounds. There is no fluid wave. There is no palpable liver or spleen tip. Extremities shows marked chronic lymphedema of the left lower leg. She has mild edema in the right lower leg. She has decreased range of motion of her joints. She has decreased strength that is mild on the left side. Skin exam shows no rashes, ecchymoses or petechia. Neurological exam shows the slight left-sided weakness.    Recent Labs  06/01/16 1400  WBC 3.6*  HGB 11.0*  HCT 35.2  PLT 214  Recent Labs  06/01/16 1400  NA 140  K 4.1  CL 103  CO2 27  GLUCOSE 279*  BUN 21  CREATININE 0.7  CALCIUM  9.2    Blood smear review:  Normochromic and normocytic publish of red blood cells. There is no rouleau formation. There is no nucleated red blood cells. I see no target cells. White cells show no immaturity. She has no hypersegmented polys. She has no atypical lymphocytes. Platelets are adequate in number and size.  Pathology: None     Assessment and Plan:  [name] is a very nice 81 year old African-American female. She has been on long-term anticoagulation because of a past thromboembolic disease. She has had sub-therapeutic levels of Coumadin and developed a TIA. She now is on therapeutic Coumadin. Her INR today was 2.5.  I do not see any reason why she cannot go on to Xarelto. She is living with her family. They are watching her very closely. She is a low fall risk because her family is very attentive.  I have her off the aspirin. When neurology saw her in the hospital, they did not think she needed aspirin.  I think that 15 mg of Xarelto daily would be reasonable. She has not had a systemic thrombus for over 15 years. I would have to think that this TIA is more so because of diabetes, high blood pressure, hyperlipidemia. Her blood sugar today was 279.  I would probably keep her on the 15 mg of Xarelto for several more months. Ultimately, I think 10 mg daily would be reasonable for her.  I really don't see that we have do a hypercoagulable workup. I don't see that we have to do any type of malignancy evaluation.  I will like to see her back in about 6 weeks so we can see how she is doing.  I spent about 45 minutes with she and her daughter. I answered their questions. I gave her a prayer blanket which she was very thankful for.

## 2016-06-02 DIAGNOSIS — Z8673 Personal history of transient ischemic attack (TIA), and cerebral infarction without residual deficits: Secondary | ICD-10-CM | POA: Diagnosis not present

## 2016-06-02 DIAGNOSIS — E785 Hyperlipidemia, unspecified: Secondary | ICD-10-CM | POA: Diagnosis not present

## 2016-06-02 DIAGNOSIS — F0390 Unspecified dementia without behavioral disturbance: Secondary | ICD-10-CM | POA: Diagnosis not present

## 2016-06-02 DIAGNOSIS — R627 Adult failure to thrive: Secondary | ICD-10-CM | POA: Diagnosis not present

## 2016-06-02 DIAGNOSIS — E119 Type 2 diabetes mellitus without complications: Secondary | ICD-10-CM | POA: Diagnosis not present

## 2016-06-02 DIAGNOSIS — I10 Essential (primary) hypertension: Secondary | ICD-10-CM | POA: Diagnosis not present

## 2016-06-03 DIAGNOSIS — F0390 Unspecified dementia without behavioral disturbance: Secondary | ICD-10-CM | POA: Diagnosis not present

## 2016-06-03 DIAGNOSIS — Z8673 Personal history of transient ischemic attack (TIA), and cerebral infarction without residual deficits: Secondary | ICD-10-CM | POA: Diagnosis not present

## 2016-06-03 DIAGNOSIS — E119 Type 2 diabetes mellitus without complications: Secondary | ICD-10-CM | POA: Diagnosis not present

## 2016-06-03 DIAGNOSIS — I10 Essential (primary) hypertension: Secondary | ICD-10-CM | POA: Diagnosis not present

## 2016-06-03 DIAGNOSIS — R627 Adult failure to thrive: Secondary | ICD-10-CM | POA: Diagnosis not present

## 2016-06-03 DIAGNOSIS — E785 Hyperlipidemia, unspecified: Secondary | ICD-10-CM | POA: Diagnosis not present

## 2016-06-06 ENCOUNTER — Telehealth: Payer: Self-pay | Admitting: *Deleted

## 2016-06-06 DIAGNOSIS — I10 Essential (primary) hypertension: Secondary | ICD-10-CM | POA: Diagnosis not present

## 2016-06-06 DIAGNOSIS — E119 Type 2 diabetes mellitus without complications: Secondary | ICD-10-CM | POA: Diagnosis not present

## 2016-06-06 DIAGNOSIS — F0390 Unspecified dementia without behavioral disturbance: Secondary | ICD-10-CM | POA: Diagnosis not present

## 2016-06-06 DIAGNOSIS — E1142 Type 2 diabetes mellitus with diabetic polyneuropathy: Secondary | ICD-10-CM

## 2016-06-06 DIAGNOSIS — E785 Hyperlipidemia, unspecified: Secondary | ICD-10-CM | POA: Diagnosis not present

## 2016-06-06 DIAGNOSIS — R627 Adult failure to thrive: Secondary | ICD-10-CM | POA: Diagnosis not present

## 2016-06-06 DIAGNOSIS — Z8673 Personal history of transient ischemic attack (TIA), and cerebral infarction without residual deficits: Secondary | ICD-10-CM | POA: Diagnosis not present

## 2016-06-06 NOTE — Telephone Encounter (Signed)
Patient's daughter aware samples are available for pick-up

## 2016-06-06 NOTE — Telephone Encounter (Signed)
Called patients daughter left voicemail for pt to return call. Per Dr. Eulas Post ok to provide Xarelto 15 mg 1 tablet daily and to stop coumadin start Xarelto today. We have samples in the closet.

## 2016-06-06 NOTE — Telephone Encounter (Signed)
Called [name], daughter and [facility] to return call. Samples left up front for pick up.  When do you want her INR Repeated?

## 2016-06-06 NOTE — Telephone Encounter (Signed)
[name] with [facility] called with Patient's INR Results. She stated that patient saw Dr. Marin Olp last week 06/01/16 and he changed her Coumadin to Xarelto 15mg  but patient has not received it through mail order yet, so she is still taking Coumadin 2mg  daily until she gets it. Please Advise.   INR today 06/06/16:  1.5  Previous  06/01/16-  2.5

## 2016-06-06 NOTE — Telephone Encounter (Signed)
No need to follow INR once she starts xeralto

## 2016-06-07 DIAGNOSIS — I10 Essential (primary) hypertension: Secondary | ICD-10-CM | POA: Diagnosis not present

## 2016-06-07 DIAGNOSIS — E785 Hyperlipidemia, unspecified: Secondary | ICD-10-CM | POA: Diagnosis not present

## 2016-06-07 DIAGNOSIS — Z8673 Personal history of transient ischemic attack (TIA), and cerebral infarction without residual deficits: Secondary | ICD-10-CM | POA: Diagnosis not present

## 2016-06-07 DIAGNOSIS — E119 Type 2 diabetes mellitus without complications: Secondary | ICD-10-CM | POA: Diagnosis not present

## 2016-06-07 DIAGNOSIS — F0390 Unspecified dementia without behavioral disturbance: Secondary | ICD-10-CM | POA: Diagnosis not present

## 2016-06-07 DIAGNOSIS — R627 Adult failure to thrive: Secondary | ICD-10-CM | POA: Diagnosis not present

## 2016-06-07 NOTE — Telephone Encounter (Signed)
Her metformin was reduced because she c/o diarrhea at her OV. Is her diarrhea resolved? If so, ok to increase metformin to 500mg  #60 take 1 tab po BID with 6RF; continue other medications as ordered and follow up as scheduled

## 2016-06-07 NOTE — Telephone Encounter (Signed)
[name] notified and agreed. Stated that she had another concern with patient Blood Sugar, it has been running alittle high. Stated that her Metformin was decreased to 500mg  once daily. Please Advise.   Readings: 1/12- 106am 1/13- 111am 1/15- 137am 1/17- 107am 1/18- 140am 1/19- 158am 1/20- 139am 1/21- 165am 1/23- 141am 1/24- 361am 1/25- 151am 1/26- 156am 1/27- 136am 1/28- 150am 1/29- 169am 1/30- 169am  After lunch during the day it gets up to 250-280.  1/26 (before dinner)-256 1/29 (before dinner)-264

## 2016-06-07 NOTE — Telephone Encounter (Signed)
LMOM for [name] to return call.

## 2016-06-08 MED ORDER — METFORMIN HCL 500 MG PO TABS: 500.0000 mg | ORAL_TABLET | Freq: Two times a day (BID) | ORAL | 1 refills | Status: DC

## 2016-06-08 NOTE — Telephone Encounter (Signed)
LMOM to return call.

## 2016-06-08 NOTE — Telephone Encounter (Signed)
[name], caregiver notified and agreed. Diarrhea is improved.  Does not needs a Rx at this time, she will call when needed. Medication list updated.

## 2016-06-09 DIAGNOSIS — Z8673 Personal history of transient ischemic attack (TIA), and cerebral infarction without residual deficits: Secondary | ICD-10-CM | POA: Diagnosis not present

## 2016-06-09 DIAGNOSIS — E119 Type 2 diabetes mellitus without complications: Secondary | ICD-10-CM | POA: Diagnosis not present

## 2016-06-09 DIAGNOSIS — I10 Essential (primary) hypertension: Secondary | ICD-10-CM | POA: Diagnosis not present

## 2016-06-09 DIAGNOSIS — E785 Hyperlipidemia, unspecified: Secondary | ICD-10-CM | POA: Diagnosis not present

## 2016-06-09 DIAGNOSIS — F0390 Unspecified dementia without behavioral disturbance: Secondary | ICD-10-CM | POA: Diagnosis not present

## 2016-06-09 DIAGNOSIS — R627 Adult failure to thrive: Secondary | ICD-10-CM | POA: Diagnosis not present

## 2016-06-14 DIAGNOSIS — F0390 Unspecified dementia without behavioral disturbance: Secondary | ICD-10-CM | POA: Diagnosis not present

## 2016-06-14 DIAGNOSIS — E119 Type 2 diabetes mellitus without complications: Secondary | ICD-10-CM | POA: Diagnosis not present

## 2016-06-14 DIAGNOSIS — R627 Adult failure to thrive: Secondary | ICD-10-CM | POA: Diagnosis not present

## 2016-06-14 DIAGNOSIS — Z8673 Personal history of transient ischemic attack (TIA), and cerebral infarction without residual deficits: Secondary | ICD-10-CM | POA: Diagnosis not present

## 2016-06-14 DIAGNOSIS — E785 Hyperlipidemia, unspecified: Secondary | ICD-10-CM | POA: Diagnosis not present

## 2016-06-14 DIAGNOSIS — I10 Essential (primary) hypertension: Secondary | ICD-10-CM | POA: Diagnosis not present

## 2016-06-15 DIAGNOSIS — I10 Essential (primary) hypertension: Secondary | ICD-10-CM | POA: Diagnosis not present

## 2016-06-15 DIAGNOSIS — R636 Underweight: Secondary | ICD-10-CM | POA: Diagnosis not present

## 2016-06-15 DIAGNOSIS — E785 Hyperlipidemia, unspecified: Secondary | ICD-10-CM | POA: Diagnosis not present

## 2016-06-15 DIAGNOSIS — I890 Lymphedema, not elsewhere classified: Secondary | ICD-10-CM | POA: Diagnosis not present

## 2016-06-15 DIAGNOSIS — F0390 Unspecified dementia without behavioral disturbance: Secondary | ICD-10-CM | POA: Diagnosis not present

## 2016-06-15 DIAGNOSIS — E1165 Type 2 diabetes mellitus with hyperglycemia: Secondary | ICD-10-CM | POA: Diagnosis not present

## 2016-06-15 DIAGNOSIS — E559 Vitamin D deficiency, unspecified: Secondary | ICD-10-CM | POA: Diagnosis not present

## 2016-06-15 DIAGNOSIS — K219 Gastro-esophageal reflux disease without esophagitis: Secondary | ICD-10-CM | POA: Diagnosis not present

## 2016-06-16 DIAGNOSIS — E119 Type 2 diabetes mellitus without complications: Secondary | ICD-10-CM | POA: Diagnosis not present

## 2016-06-16 DIAGNOSIS — R627 Adult failure to thrive: Secondary | ICD-10-CM | POA: Diagnosis not present

## 2016-06-16 DIAGNOSIS — I10 Essential (primary) hypertension: Secondary | ICD-10-CM | POA: Diagnosis not present

## 2016-06-16 DIAGNOSIS — Z8673 Personal history of transient ischemic attack (TIA), and cerebral infarction without residual deficits: Secondary | ICD-10-CM | POA: Diagnosis not present

## 2016-06-16 DIAGNOSIS — F0390 Unspecified dementia without behavioral disturbance: Secondary | ICD-10-CM | POA: Diagnosis not present

## 2016-06-16 DIAGNOSIS — E785 Hyperlipidemia, unspecified: Secondary | ICD-10-CM | POA: Diagnosis not present

## 2016-06-20 ENCOUNTER — Telehealth: Payer: Self-pay | Admitting: *Deleted

## 2016-06-20 DIAGNOSIS — Z8673 Personal history of transient ischemic attack (TIA), and cerebral infarction without residual deficits: Secondary | ICD-10-CM | POA: Diagnosis not present

## 2016-06-20 DIAGNOSIS — E119 Type 2 diabetes mellitus without complications: Secondary | ICD-10-CM | POA: Diagnosis not present

## 2016-06-20 DIAGNOSIS — E785 Hyperlipidemia, unspecified: Secondary | ICD-10-CM | POA: Diagnosis not present

## 2016-06-20 DIAGNOSIS — R627 Adult failure to thrive: Secondary | ICD-10-CM | POA: Diagnosis not present

## 2016-06-20 DIAGNOSIS — I10 Essential (primary) hypertension: Secondary | ICD-10-CM | POA: Diagnosis not present

## 2016-06-20 DIAGNOSIS — F0390 Unspecified dementia without behavioral disturbance: Secondary | ICD-10-CM | POA: Diagnosis not present

## 2016-06-20 NOTE — Telephone Encounter (Signed)
[name] with [facility] Notified and will obtain.

## 2016-06-20 NOTE — Telephone Encounter (Signed)
Ok for UA.

## 2016-06-20 NOTE — Telephone Encounter (Signed)
Feliz Beam with [facility] called and stated that patient's blood sugar is running between 133-231 and she is on Metformin 500mg  twice daily.  Blood Pressure today was 170/80, nurse wants a verbal order to obtain a U/A due to personality difference and Lethargic at times. Please Advise.

## 2016-06-23 ENCOUNTER — Encounter: Payer: Self-pay | Admitting: Internal Medicine

## 2016-06-24 ENCOUNTER — Telehealth: Payer: Self-pay

## 2016-06-24 ENCOUNTER — Encounter: Payer: Self-pay | Admitting: Internal Medicine

## 2016-06-24 DIAGNOSIS — F0390 Unspecified dementia without behavioral disturbance: Secondary | ICD-10-CM | POA: Diagnosis not present

## 2016-06-24 DIAGNOSIS — E119 Type 2 diabetes mellitus without complications: Secondary | ICD-10-CM | POA: Diagnosis not present

## 2016-06-24 DIAGNOSIS — I10 Essential (primary) hypertension: Secondary | ICD-10-CM | POA: Diagnosis not present

## 2016-06-24 DIAGNOSIS — R627 Adult failure to thrive: Secondary | ICD-10-CM | POA: Diagnosis not present

## 2016-06-24 DIAGNOSIS — Z8673 Personal history of transient ischemic attack (TIA), and cerebral infarction without residual deficits: Secondary | ICD-10-CM | POA: Diagnosis not present

## 2016-06-24 DIAGNOSIS — R4182 Altered mental status, unspecified: Secondary | ICD-10-CM | POA: Diagnosis not present

## 2016-06-24 DIAGNOSIS — E785 Hyperlipidemia, unspecified: Secondary | ICD-10-CM | POA: Diagnosis not present

## 2016-06-24 NOTE — Telephone Encounter (Signed)
I notified home health care nurse of Dr. Vale Haven recommendation and she stated that family would probably not do that.

## 2016-06-24 NOTE — Telephone Encounter (Signed)
[name] with [facility] stated that she did an in/out cath and the urine is a very dark brown, very foul smelling (like stool), has fever of 100.2, not eating well, has been sleeping all day.   [name] has swelling left hand at wrist joint, looks like gout flare. No known trauma to hand.   [name] blood sugar is 231, [name] has taken all medications today but only eaten a small amt.   [name] would like to know if provider recommends starting an antibiotic while waiting for urine culture.  Please advise.

## 2016-06-24 NOTE — Telephone Encounter (Signed)
Recommend pt go to hospital for further eval. Her Urine cx was not processed due to improper collection. Due to her current sx's, she needs to be seen in ED

## 2016-06-24 NOTE — Telephone Encounter (Signed)
Home Health Care nurse, [name], called to say that urine specimen was refused by lab due to being in wrong type of container. [name] stated that she was going to collect a new specimen today. She also stated that she was told that patient's hand was swollen. She stated that she would call the office with an update once she evaluated patient.

## 2016-06-25 ENCOUNTER — Emergency Department (HOSPITAL_BASED_OUTPATIENT_CLINIC_OR_DEPARTMENT_OTHER): Payer: Medicare Other

## 2016-06-25 ENCOUNTER — Emergency Department (HOSPITAL_BASED_OUTPATIENT_CLINIC_OR_DEPARTMENT_OTHER)
Admission: EM | Admit: 2016-06-25 | Discharge: 2016-06-25 | Disposition: A | Discharge status: Home / Self Care | Payer: Medicare Other | Attending: Emergency Medicine | Admitting: Emergency Medicine

## 2016-06-25 ENCOUNTER — Encounter (HOSPITAL_BASED_OUTPATIENT_CLINIC_OR_DEPARTMENT_OTHER): Payer: Self-pay | Admitting: Emergency Medicine

## 2016-06-25 DIAGNOSIS — Z79899 Other long term (current) drug therapy: Secondary | ICD-10-CM | POA: Diagnosis not present

## 2016-06-25 DIAGNOSIS — M109 Gout, unspecified: Secondary | ICD-10-CM

## 2016-06-25 DIAGNOSIS — R0602 Shortness of breath: Secondary | ICD-10-CM | POA: Insufficient documentation

## 2016-06-25 DIAGNOSIS — E119 Type 2 diabetes mellitus without complications: Secondary | ICD-10-CM | POA: Diagnosis not present

## 2016-06-25 DIAGNOSIS — R300 Dysuria: Secondary | ICD-10-CM | POA: Insufficient documentation

## 2016-06-25 DIAGNOSIS — I10 Essential (primary) hypertension: Secondary | ICD-10-CM | POA: Insufficient documentation

## 2016-06-25 DIAGNOSIS — M10032 Idiopathic gout, left wrist: Secondary | ICD-10-CM | POA: Diagnosis not present

## 2016-06-25 DIAGNOSIS — M7989 Other specified soft tissue disorders: Secondary | ICD-10-CM | POA: Diagnosis not present

## 2016-06-25 DIAGNOSIS — M25532 Pain in left wrist: Secondary | ICD-10-CM | POA: Diagnosis not present

## 2016-06-25 DIAGNOSIS — Z7984 Long term (current) use of oral hypoglycemic drugs: Secondary | ICD-10-CM | POA: Diagnosis not present

## 2016-06-25 DIAGNOSIS — M1990 Unspecified osteoarthritis, unspecified site: Secondary | ICD-10-CM | POA: Diagnosis not present

## 2016-06-25 LAB — URINALYSIS, ROUTINE W REFLEX MICROSCOPIC
Bilirubin Urine: NEGATIVE
Glucose, UA: 250 mg/dL — AB
Hgb urine dipstick: NEGATIVE
KETONES UR: NEGATIVE mg/dL
LEUKOCYTES UA: NEGATIVE
Nitrite: NEGATIVE
PROTEIN: NEGATIVE mg/dL
Specific Gravity, Urine: 1.01 (ref 1.005–1.030)
pH: 6 (ref 5.0–8.0)

## 2016-06-25 MED ORDER — PREDNISONE 20 MG PO TABS
40.0000 mg | ORAL_TABLET | Freq: Once | ORAL | Status: AC
  Administered 2016-06-25: 40 mg via ORAL
  Filled 2016-06-25: qty 2

## 2016-06-25 MED ORDER — TRAMADOL HCL 50 MG PO TABS: 50.0000 mg | ORAL_TABLET | Freq: Four times a day (QID) | ORAL | 0 refills | Status: DC | PRN

## 2016-06-25 MED ORDER — PREDNISONE 10 MG PO TABS: 40.0000 mg | ORAL_TABLET | Freq: Every day | ORAL | 0 refills | Status: DC

## 2016-06-25 NOTE — Discharge Instructions (Signed)
Urinalysis negative for urinary tract infection. X-ray shows no bony injury. Symptoms highly suggestive of gouty arthritis. Take the prednisone as directed for the next 5 days. Take tramadol as needed for pain. Make an appointment to follow-up with her doctor.

## 2016-06-25 NOTE — ED Provider Notes (Addendum)
[facility] DEPT [facility] Provider Note   CSN: [id number] Arrival date & time: 06/25/16  1101     History   Chief Complaint Chief Complaint  Patient presents with  . Wrist Pain    HPI [name] is a 81 y.o. female.  Patient with a fall on Wednesday caregiver was present in the caregiver just helped her to the floor. So there was no any real significant trauma associated with fall. Here recently patients developed left wrist redness and swelling. Patient's primary care provider raised concerns for possible urinary tract infection she was supposed to come in for a urinalysis that did not get done. Family would like to have that checked as well. Patient without any other specific complaints.    And has a history of dementia but will follow commands. Will answer most questions.  Past Medical History:  Diagnosis Date  . Dementia   . Diabetes mellitus type II 03/21/2011  . DVT (deep venous thrombosis) ([place]) 03/21/2011  . Gallstones   . Hyperlipidemia 03/21/2011  . Hypertension 03/21/2011  . Lymphedema of leg 03/21/2011  . Pulmonary embolism ([place]) 03/21/2011    Patient Active Problem List   Diagnosis Date Noted  . Acute CVA (cerebrovascular accident) ([place])   . Left-sided weakness 05/11/2016  . Dysarthria 05/11/2016  . Acute left-sided weakness 05/11/2016  . History of pulmonary embolism 03/21/2011  . History of DVT (deep vein thrombosis) 03/21/2011  . Diabetes mellitus type II, non insulin dependent ([place]) 03/21/2011  . Lymphedema of leg 03/21/2011  . Hyperlipidemia 03/21/2011  . Hypertension 03/21/2011    Past Surgical History:  Procedure Laterality Date  . CHOLECYSTECTOMY  2015  . VAGINAL HYSTERECTOMY     unknown of date  . VASCULAR SURGERY      OB History    No data available       Home Medications    Prior to Admission medications   Medication Sig Start Date End Date Taking? Authorizing Provider  acetaminophen (TYLENOL) 500 MG tablet Take 500 mg by  mouth every 6 (six) hours as needed.   Yes Historical Provider, MD  atorvastatin (LIPITOR) 20 MG tablet Take 20 mg by mouth daily at 6 PM.    Yes Historical Provider, MD  cloNIDine (CATAPRES) 0.1 MG tablet Take 0.1 mg by mouth 2 (two) times daily.   Yes Historical Provider, MD  Dexlansoprazole (DEXILANT) 30 MG capsule Take 30 mg by mouth daily.   Yes Historical Provider, MD  gabapentin (NEURONTIN) 300 MG capsule Take 300 mg by mouth 3 (three) times daily.   Yes Historical Provider, MD  metFORMIN (GLUCOPHAGE) 500 MG tablet Take 1 tablet (500 mg total) by mouth 2 (two) times daily. 06/08/16  Yes Gildardo Cranker, DO  Rivaroxaban (XARELTO) 15 MG TABS tablet Take 1 tablet (15 mg total) by mouth daily with supper. 90 day supply 06/01/16  Yes [name], MD  predniSONE (DELTASONE) 10 MG tablet Take 4 tablets (40 mg total) by mouth daily. 06/25/16   Fredia Sorrow, MD  Rivastigmine (EXELON) 13.3 MG/24HR PT24 Place 1 patch onto the skin daily.    Historical Provider, MD  traMADol (ULTRAM) 50 MG tablet Take 1 tablet (50 mg total) by mouth every 6 (six) hours as needed. 06/25/16   Fredia Sorrow, MD    Family History Family History  Problem Relation Age of Onset  . Heart attack Mother   . COPD Sister   . Stroke Sister   . Bipolar disorder Daughter     Social  History Social History  Substance Use Topics  . Smoking status: Never Smoker  . Smokeless tobacco: Never Used  . Alcohol use No     Allergies   Patient has no known allergies.   Review of Systems Review of Systems  Constitutional: Negative for fever.  HENT: Negative for congestion.   Eyes: Negative for redness.  Respiratory: Positive for shortness of breath.   Cardiovascular: Negative for chest pain.  Gastrointestinal: Negative for abdominal pain.  Genitourinary: Positive for dysuria.  Musculoskeletal: Positive for joint swelling.  Skin: Negative for wound.  Allergic/Immunologic: Negative for immunocompromised state.    Neurological: Negative for headaches.  Hematological: Does not bruise/bleed easily.     Physical Exam Updated Vital Signs BP 185/81 (BP Location: Right Arm)   Pulse 62   Temp 98.9 F (37.2 C) (Oral)   Resp 20   Ht 5\' 7"  (1.702 m)   Wt 49.9 kg   SpO2 100%   BMI 17.23 kg/m   Physical Exam  Constitutional: She appears well-developed and well-nourished. No distress.  HENT:  Head: Normocephalic and atraumatic.  Mouth/Throat: Oropharynx is clear and moist.  Eyes: EOM are normal. Pupils are equal, round, and reactive to light.  Neck: Neck supple.  Cardiovascular: Normal rate and regular rhythm.   Pulmonary/Chest: Effort normal and breath sounds normal. No respiratory distress.  Abdominal: Soft. Bowel sounds are normal. There is no tenderness.  Musculoskeletal: Normal range of motion. She exhibits edema and tenderness.  Neurological: She is alert. No cranial nerve deficit or sensory deficit. She exhibits normal muscle tone. Coordination normal.  Skin: Skin is warm. No rash noted. There is erythema.  Nursing note and vitals reviewed.    ED Treatments / Results  Labs (all labs ordered are listed, but only abnormal results are displayed) Labs Reviewed  URINALYSIS, ROUTINE W REFLEX MICROSCOPIC - Abnormal; Notable for the following:       Result Value   Glucose, UA 250 (*)    All other components within normal limits    EKG  EKG Interpretation None       Radiology Dg Wrist Complete Left  Result Date: 06/25/2016 CLINICAL DATA:  Left wrist pain and swelling.  No known injury. EXAM: LEFT WRIST - COMPLETE 3+ VIEW COMPARISON:  Left hand radiographs - earlier same date FINDINGS: Mild diffuse soft tissue swelling about the wrist. No associated fracture or dislocation. No subcutaneous emphysema or radiopaque foreign body. Chondrocalcinosis within the TFCC. Joint spaces are preserved. No erosions. Distal vascular calcifications. IMPRESSION: 1. Nonspecific diffuse soft tissue  swelling about the wrist without associated fracture or dislocation. 2. Chondrocalcinosis suggestive of CPPD. Electronically Signed   By: Sandi Mariscal M.D.   On: 06/25/2016 11:49   Dg Hand Complete Left  Result Date: 06/25/2016 CLINICAL DATA:  Left hand and wrist swelling for the past 3 days. No known injury. EXAM: LEFT HAND - COMPLETE 3+ VIEW COMPARISON:  Left wrist radiographs - 06/25/2016 FINDINGS: No fracture or dislocation. Chondrocalcinosis is noted within the TFCC. Joint spaces are preserved. No discrete erosions. Ossicles adjacent to the ulnar aspect of the PIP joint of the fourth digit is likely the sequela of remote avulsive injury. Suspected mild diffuse soft tissue swelling about the wrist. No subcutaneous emphysema. No radiopaque foreign body. Vascular calcifications. IMPRESSION: 1. Mild soft tissue swelling about the wrist without associated fracture or radiopaque foreign body. 2. Chondrocalcinosis suggestive CPPD. Electronically Signed   By: Sandi Mariscal M.D.   On: 06/25/2016 11:47  Procedures Procedures (including critical care time)  Medications Ordered in ED Medications  predniSONE (DELTASONE) tablet 40 mg (not administered)     Initial Impression / Assessment and Plan / ED Course  I have reviewed the triage vital signs and the nursing notes.  Pertinent labs & imaging results that were available during my care of the patient were reviewed by me and considered in my medical decision making (see chart for details).     Patient left wrist is consistent with an inflammatory arthritis. High suspicion for gout. X-rays negative for any bony injuries. Urinalysis negative urinary tract infection. Will treat with prednisone precaution would be this could elevate her blood sugars. And we'll treat the pain with tramadol. Recommend follow-up with her regular doctor.   this is not concerning for a septic arthritis.  Final Clinical Impressions(s) / ED Diagnoses   Final diagnoses:    Left wrist pain  Arthritis due to gout    New Prescriptions New Prescriptions   PREDNISONE (DELTASONE) 10 MG TABLET    Take 4 tablets (40 mg total) by mouth daily.   TRAMADOL (ULTRAM) 50 MG TABLET    Take 1 tablet (50 mg total) by mouth every 6 (six) hours as needed.     Fredia Sorrow, MD 06/25/16 [place], MD 06/25/16 [place], MD 06/25/16 [place], MD 06/25/16 1426

## 2016-06-25 NOTE — ED Triage Notes (Signed)
Pain and swelling to left hand and wrist since Thursday, ? Fall. CMS intact . Given 2 tylenol x 1 hr ago

## 2016-06-27 DIAGNOSIS — F0390 Unspecified dementia without behavioral disturbance: Secondary | ICD-10-CM | POA: Diagnosis not present

## 2016-06-27 DIAGNOSIS — R627 Adult failure to thrive: Secondary | ICD-10-CM | POA: Diagnosis not present

## 2016-06-27 DIAGNOSIS — E785 Hyperlipidemia, unspecified: Secondary | ICD-10-CM | POA: Diagnosis not present

## 2016-06-27 DIAGNOSIS — E119 Type 2 diabetes mellitus without complications: Secondary | ICD-10-CM | POA: Diagnosis not present

## 2016-06-27 DIAGNOSIS — I10 Essential (primary) hypertension: Secondary | ICD-10-CM | POA: Diagnosis not present

## 2016-06-27 DIAGNOSIS — Z8673 Personal history of transient ischemic attack (TIA), and cerebral infarction without residual deficits: Secondary | ICD-10-CM | POA: Diagnosis not present

## 2016-06-29 ENCOUNTER — Telehealth: Payer: Self-pay | Admitting: *Deleted

## 2016-06-29 ENCOUNTER — Telehealth: Payer: Self-pay

## 2016-06-29 DIAGNOSIS — I10 Essential (primary) hypertension: Secondary | ICD-10-CM | POA: Diagnosis not present

## 2016-06-29 DIAGNOSIS — F0390 Unspecified dementia without behavioral disturbance: Secondary | ICD-10-CM | POA: Diagnosis not present

## 2016-06-29 DIAGNOSIS — E785 Hyperlipidemia, unspecified: Secondary | ICD-10-CM | POA: Diagnosis not present

## 2016-06-29 DIAGNOSIS — R627 Adult failure to thrive: Secondary | ICD-10-CM | POA: Diagnosis not present

## 2016-06-29 DIAGNOSIS — E119 Type 2 diabetes mellitus without complications: Secondary | ICD-10-CM | POA: Diagnosis not present

## 2016-06-29 DIAGNOSIS — Z8673 Personal history of transient ischemic attack (TIA), and cerebral infarction without residual deficits: Secondary | ICD-10-CM | POA: Diagnosis not present

## 2016-06-29 NOTE — Telephone Encounter (Signed)
Recommend she increase metformin to 2 tabs in AM and continue 1 tab in PM due to high blood sugars while on prednisone. May reduce back to normal dosing once she completes steroid

## 2016-06-29 NOTE — Telephone Encounter (Signed)
Received fax from [facility] regarding patient's U/A results. Placed in Dr. [place] folder to review and sign.

## 2016-06-29 NOTE — Telephone Encounter (Signed)
Patient's daughter [name] called to schedule an appointment for ER follow-up. [name] requested a Monday appointment, appointment scheduled with Dr.Reed.  Pat questions guidelines for elevated blood sugar while on prednisone. [name] is aware that elevated blood sugar is a side effect of taking a steroid, yet her mothers blood sugar was 425 fasting today and 350 fasting yesterday.  Please advise if any interventions is needed at this time

## 2016-06-30 DIAGNOSIS — R627 Adult failure to thrive: Secondary | ICD-10-CM | POA: Diagnosis not present

## 2016-06-30 DIAGNOSIS — E785 Hyperlipidemia, unspecified: Secondary | ICD-10-CM | POA: Diagnosis not present

## 2016-06-30 DIAGNOSIS — F0390 Unspecified dementia without behavioral disturbance: Secondary | ICD-10-CM | POA: Diagnosis not present

## 2016-06-30 DIAGNOSIS — Z8673 Personal history of transient ischemic attack (TIA), and cerebral infarction without residual deficits: Secondary | ICD-10-CM | POA: Diagnosis not present

## 2016-06-30 DIAGNOSIS — I10 Essential (primary) hypertension: Secondary | ICD-10-CM | POA: Diagnosis not present

## 2016-06-30 DIAGNOSIS — E119 Type 2 diabetes mellitus without complications: Secondary | ICD-10-CM | POA: Diagnosis not present

## 2016-06-30 NOTE — Telephone Encounter (Signed)
[name], daughter notified and agreed.

## 2016-07-04 ENCOUNTER — Ambulatory Visit (INDEPENDENT_AMBULATORY_CARE_PROVIDER_SITE_OTHER): Payer: Medicare Other | Admitting: Internal Medicine

## 2016-07-04 ENCOUNTER — Encounter: Payer: Self-pay | Admitting: Internal Medicine

## 2016-07-04 VITALS — BP 116/72 | HR 96 | Temp 98.5°F

## 2016-07-04 DIAGNOSIS — M11232 Other chondrocalcinosis, left wrist: Secondary | ICD-10-CM

## 2016-07-04 DIAGNOSIS — E119 Type 2 diabetes mellitus without complications: Secondary | ICD-10-CM | POA: Diagnosis not present

## 2016-07-04 DIAGNOSIS — R410 Disorientation, unspecified: Secondary | ICD-10-CM

## 2016-07-04 DIAGNOSIS — R627 Adult failure to thrive: Secondary | ICD-10-CM | POA: Diagnosis not present

## 2016-07-04 DIAGNOSIS — E785 Hyperlipidemia, unspecified: Secondary | ICD-10-CM | POA: Diagnosis not present

## 2016-07-04 DIAGNOSIS — I639 Cerebral infarction, unspecified: Secondary | ICD-10-CM

## 2016-07-04 DIAGNOSIS — Z8673 Personal history of transient ischemic attack (TIA), and cerebral infarction without residual deficits: Secondary | ICD-10-CM | POA: Diagnosis not present

## 2016-07-04 DIAGNOSIS — I10 Essential (primary) hypertension: Secondary | ICD-10-CM | POA: Diagnosis not present

## 2016-07-04 DIAGNOSIS — F0390 Unspecified dementia without behavioral disturbance: Secondary | ICD-10-CM | POA: Diagnosis not present

## 2016-07-04 NOTE — Progress Notes (Signed)
Location:  [facility] clinic Provider: Jessenia Filippone L. Mariea Clonts, D.O., C.M.D.  Code Status: DNR Goals of Care:  Advanced Directives 06/25/2016  Does Patient Have a Medical Advance Directive? Yes  Type of Advance Directive [place]  Does patient want to make changes to medical advance directive? -  Copy of [place] in Chart? No - copy requested     Chief Complaint  Patient presents with  . Follow-up    Emergency room follow-up on left wrist pain, seen on 06/25/16 in ER. Left wrist is better- improved swelling. Here with daughter-in-law [name]  . Follow-up    Discuss labs from [facility]- urinalysis    HPI: Patient is a 81 y.o. female seen today for an acute visit for emergency room follow up. She went to the ED on 06/25/16 with left wrist pain and swelling.   She had a positive UA on 2/16 ([facility]) [facility] but only 50-100K bacteria (small sample that looked dark).  Was done due to some elevation of glucose levels despite metformin, personality difference and lethargy.  She had a low grade fever of 100.2, was not eating well, and was sleeping all day as of 2/16.  Immediately after, it was 99.  Swelling of the left wrist was noted suspected to be gout flare.  CBG was 231 with very little intake. Dr. Eulas Post had sent her to the ED. UA was done in the ED on 2/17 and negative so culture not sent out.  She was treated for a gout flare with prednisone and tramadol.  She had also been lowered to the floor with fall on 2/14.  No other labs were done to assess her cognitive status in the ED.  xrays suggested pseudogout in wrist and soft tissue swelling.  Both urine samples with I/O cath samples.  She has finished her prednisone therapy.    Lethargy is off and on.  She was real sleepy at lunchtime today after waking up only around 9:30am.  There has been more of this recent.    Metformin had been reduced b/c of some GI side effects.  Metformin seemed to cover  everything with 2000mg  but now on 500mg  it's been upper 100s to 200s.    They have nursing and PT coming out to the home.    Lab Results  Component Value Date   HGBA1C 6.1 (H) 05/11/2016   No results found for: [facility]   Past Medical History:  Diagnosis Date  . Dementia   . Diabetes mellitus type II 03/21/2011  . DVT (deep venous thrombosis) ([place]) 03/21/2011  . Gallstones   . Hyperlipidemia 03/21/2011  . Hypertension 03/21/2011  . Lymphedema of leg 03/21/2011  . Pulmonary embolism ([place]) 03/21/2011    Past Surgical History:  Procedure Laterality Date  . CHOLECYSTECTOMY  2015  . VAGINAL HYSTERECTOMY     unknown of date  . VASCULAR SURGERY      No Known Allergies  Allergies as of 07/04/2016   No Known Allergies     Medication List       Accurate as of 07/04/16  4:02 PM. Always use your most recent med list.          acetaminophen 500 MG tablet Commonly known as:  TYLENOL Take 500 mg by mouth every 6 (six) hours as needed.   atorvastatin 20 MG tablet Commonly known as:  LIPITOR Take 20 mg by mouth daily at 6 PM.   cholecalciferol 1000 units tablet Commonly  known as:  VITAMIN D Take 1,000 Units by mouth daily.   cloNIDine 0.1 MG tablet Commonly known as:  CATAPRES Take 0.1 mg by mouth 2 (two) times daily.   DEXILANT 30 MG capsule Generic drug:  Dexlansoprazole Take 30 mg by mouth daily.   gabapentin 300 MG capsule Commonly known as:  NEURONTIN Take 300 mg by mouth 3 (three) times daily.   metFORMIN 500 MG tablet Commonly known as:  GLUCOPHAGE Take 1 tablet (500 mg total) by mouth 2 (two) times daily.   Omega 3 1200 MG Caps Take by mouth daily.   PROBIOTIC PO Take by mouth daily.   Rivaroxaban 15 MG Tabs tablet Commonly known as:  XARELTO Take 1 tablet (15 mg total) by mouth daily with supper. 90 day supply   VITAMIN B COMPLEX PO Take by mouth daily.       Review of Systems:  Review of Systems  Constitutional: Positive for  malaise/fatigue. Negative for chills and fever.       Fever was just before pseudogout episode  HENT: Negative for congestion.   Eyes: Negative for blurred vision.  Respiratory: Negative for shortness of breath.   Cardiovascular: Positive for leg swelling. Negative for chest pain and palpitations.       Right leg lymphedema  Gastrointestinal: Negative for abdominal pain, blood in stool, diarrhea, melena, nausea and vomiting.  Genitourinary: Negative for dysuria, frequency and urgency.  Musculoskeletal: Positive for joint pain. Negative for falls.  Skin: Negative for itching and rash.  Neurological: Positive for focal weakness. Negative for dizziness, speech change, loss of consciousness and headaches.  Psychiatric/Behavioral: Positive for memory loss.    Health Maintenance  Topic Date Due  . FOOT EXAM  08/08/1945  . OPHTHALMOLOGY EXAM  08/08/1945  . URINE MICROALBUMIN  08/08/1945  . TETANUS/TDAP  08/07/1956  . DEXA SCAN  08/08/2002  . PNA vac Low Risk Adult (1 of 2 - PCV13) 08/08/2002  . INFLUENZA VACCINE  05/24/2017 (Originally 12/08/2015)  . HEMOGLOBIN A1C  11/08/2016    Physical Exam: Vitals:   07/04/16 1551  BP: 116/72  Pulse: 96  Temp: 98.5 F (36.9 C)  TempSrc: Oral  SpO2: 97%   There is no height or weight on file to calculate BMI. Physical Exam  Constitutional: She is oriented to person, place, and time. No distress.  Cardiovascular: Normal rate, regular rhythm and normal heart sounds.   Pulmonary/Chest: Effort normal and breath sounds normal. She has no rales.  Abdominal: Soft. Bowel sounds are normal.  Musculoskeletal: Normal range of motion.  Decreased left handgrip, longterm difficulty moving left leg due to knee pain and lymphedema; lethargic; difficulty following commands including leaning forward--daughter in law had to help; left wrist with decreased flexion, no residual erythema, warmth or swelling  Neurological: She is alert and oriented to person, place,  and time.  Skin: Skin is warm and dry.    Labs reviewed: Basic Metabolic Panel:  Recent Labs  03/30/16  05/12/16 0723  05/13/16 1009 05/14/16 0744 05/15/16 1132 06/01/16 1400  NA 142  < >  --   < >  --  137 138 140  K 4.2  < >  --   < >  --  4.3 4.7 4.1  CL  --   < >  --   < >  --  103 102 103  CO2  --   < >  --   < >  --  26 27 27   GLUCOSE  --   < >  --   < >  --  98 140* 279*  BUN 15  < >  --   < >  --  16 15 21   CREATININE 0.7  < >  --   < >  --  0.53 0.55 0.7  CALCIUM  --   < >  --   < >  --  8.7* 8.7* 9.2  TSH 2.72  --  6.336*  --  2.831  --   --   --   < > = values in this interval not displayed. Liver Function Tests:  Recent Labs  03/08/16 1512 03/30/16 05/11/16 1614 06/01/16 1400  AST 30 15 17 19   ALT 17 14 10* 25  ALKPHOS 49 62 62 64  BILITOT 1.4*  --  0.3 0.60  PROT 5.5*  --  6.8 6.9  ALBUMIN 2.5*  --  3.3* 3.0*   No results for input(s): LIPASE, AMYLASE in the last 8760 hours. No results for input(s): AMMONIA in the last 8760 hours. CBC:  Recent Labs  05/11/16 1614  05/15/16 0647 05/15/16 1132 06/01/16 1400  WBC 4.4  < > 3.1* 4.4 3.6*  NEUTROABS 2.3  --   --  2.4 1.7  HGB 11.2*  < > 8.8* 10.0* 11.0*  HCT 35.7*  < > 28.0* 31.2* 35.2  MCV 90.6  < > 91.2 89.7 94  PLT 223  < > 64* 208 214  < > = values in this interval not displayed. Lipid Panel:  Recent Labs  03/30/16 05/12/16 0723  CHOL 143 148  HDL 51 43  LDLCALC 80 96  TRIG 123 46  CHOLHDL  --  3.4   Lab Results  Component Value Date   HGBA1C 6.1 (H) 05/11/2016    Procedures since last visit: Dg Wrist Complete Left  Result Date: 06/25/2016 CLINICAL DATA:  Left wrist pain and swelling.  No known injury. EXAM: LEFT WRIST - COMPLETE 3+ VIEW COMPARISON:  Left hand radiographs - earlier same date FINDINGS: Mild diffuse soft tissue swelling about the wrist. No associated fracture or dislocation. No subcutaneous emphysema or radiopaque foreign body. Chondrocalcinosis within the TFCC. Joint  spaces are preserved. No erosions. Distal vascular calcifications. IMPRESSION: 1. Nonspecific diffuse soft tissue swelling about the wrist without associated fracture or dislocation. 2. Chondrocalcinosis suggestive of CPPD. Electronically Signed   By: Sandi Mariscal M.D.   On: 06/25/2016 11:49   Dg Hand Complete Left  Result Date: 06/25/2016 CLINICAL DATA:  Left hand and wrist swelling for the past 3 days. No known injury. EXAM: LEFT HAND - COMPLETE 3+ VIEW COMPARISON:  Left wrist radiographs - 06/25/2016 FINDINGS: No fracture or dislocation. Chondrocalcinosis is noted within the TFCC. Joint spaces are preserved. No discrete erosions. Ossicles adjacent to the ulnar aspect of the PIP joint of the fourth digit is likely the sequela of remote avulsive injury. Suspected mild diffuse soft tissue swelling about the wrist. No subcutaneous emphysema. No radiopaque foreign body. Vascular calcifications. IMPRESSION: 1. Mild soft tissue swelling about the wrist without associated fracture or radiopaque foreign body. 2. Chondrocalcinosis suggestive CPPD. Electronically Signed   By: Sandi Mariscal M.D.   On: 06/25/2016 11:47    Assessment/Plan 1. Delirium -etiology not entirely clear--had two urine samples 3 days apart which were not at all the same (positive one from home with inadequate bacteria and negative UA at ED both cath samples) -suspect dehydration/poor po intake is major culprit though they have been pushing fluids since ED visit -also could be due to her pseudogout itself, now  prednisone therapy and some elevation of glucose, could also be progression of acute stroke  - Basic metabolic panel - CBC with Differential/Platelet  2. Pseudogout of left wrist -completed prednisone treatment  3. Diabetes mellitus type II, non insulin dependent ([place]) -cont metformin therapy at reduced dose due to prior gi side effects  Labs/tests ordered:   Orders Placed This Encounter  Procedures  . Basic metabolic panel    . CBC with Differential/Platelet    Next appt:  07/19/2016  Pt instructions:  We'll check her blood today for signs of infection and dehydration.   If this does not tell us a whole lot, we'll treat her for UTI in case that's the issue. If none of this improves her symptoms, then we it may be that she's had another small stroke.    Jaydrian Corpening L. Tupac Jeffus, D.O. [facility] [address], [place] [address] Cell Phone (Mon-Fri 8am-5pm):  [phone] On Call:  [phone] & follow prompts after 5pm & weekends Office Phone:  [phone] Office Fax:  [phone]

## 2016-07-04 NOTE — Patient Instructions (Signed)
We'll check her blood today for signs of infection and dehydration.   If this does not tell us a whole lot, we'll treat her for UTI in case that's the issue. If none of this improves her symptoms, then we it may be that she's had another small stroke.

## 2016-07-05 ENCOUNTER — Telehealth: Payer: Self-pay | Admitting: *Deleted

## 2016-07-05 ENCOUNTER — Other Ambulatory Visit: Payer: Medicare Other

## 2016-07-05 DIAGNOSIS — I10 Essential (primary) hypertension: Secondary | ICD-10-CM | POA: Diagnosis not present

## 2016-07-05 DIAGNOSIS — E119 Type 2 diabetes mellitus without complications: Secondary | ICD-10-CM | POA: Diagnosis not present

## 2016-07-05 DIAGNOSIS — Z86711 Personal history of pulmonary embolism: Secondary | ICD-10-CM | POA: Diagnosis not present

## 2016-07-05 DIAGNOSIS — E785 Hyperlipidemia, unspecified: Secondary | ICD-10-CM | POA: Diagnosis not present

## 2016-07-05 DIAGNOSIS — Z5181 Encounter for therapeutic drug level monitoring: Secondary | ICD-10-CM | POA: Diagnosis not present

## 2016-07-05 DIAGNOSIS — F0390 Unspecified dementia without behavioral disturbance: Secondary | ICD-10-CM | POA: Diagnosis not present

## 2016-07-05 DIAGNOSIS — Z0000 Encounter for general adult medical examination without abnormal findings: Secondary | ICD-10-CM | POA: Diagnosis not present

## 2016-07-05 DIAGNOSIS — Z8673 Personal history of transient ischemic attack (TIA), and cerebral infarction without residual deficits: Secondary | ICD-10-CM | POA: Diagnosis not present

## 2016-07-05 DIAGNOSIS — R627 Adult failure to thrive: Secondary | ICD-10-CM | POA: Diagnosis not present

## 2016-07-05 LAB — CBC AND DIFFERENTIAL
HEMATOCRIT: 38 % (ref 36–46)
HEMOGLOBIN: 11.8 g/dL — AB (ref 12.0–16.0)
Neutrophils Absolute: 5 /uL
Platelets: 248 10*3/uL (ref 150–399)
WBC: 7.4 10^3/mL

## 2016-07-05 NOTE — Telephone Encounter (Signed)
Noted.  Hopefully they can get her blood since she was so dry yesterday.

## 2016-07-05 NOTE — Telephone Encounter (Signed)
[name], Caregiver called and wanted to know if Home Health could draw patient's blood today and drop it off.  I spoke with Olivia Mackie, in the lab, and she called [facility] on [address] and they informed her that that was fine as long as Home Health nurse puts on the tube, the patient's name, DOB, date, time and nurse's initials and drop it off at the [address] location. Stated that they can pull the orders from the system.  [name] notified and agreed and will Have Home health draw the labs. She also stated that she will have them call us if they have any concerns. Agreed.

## 2016-07-05 NOTE — Telephone Encounter (Signed)
[name] with [facility] called and left message on Clinical Intake Line that they were able to only draw 1 tube of blood and that was the CBC. They could not get the BMP, tried 3 times. Stated that the daughter can bring patient back here tomorrow if you want to have the other drawn.

## 2016-07-06 ENCOUNTER — Other Ambulatory Visit: Payer: Medicare Other

## 2016-07-06 LAB — BASIC METABOLIC PANEL
BUN: 22 mg/dL (ref 7–25)
CO2: 23 mmol/L (ref 20–31)
Calcium: 9.3 mg/dL (ref 8.6–10.4)
Chloride: 99 mmol/L (ref 98–110)
Creat: 0.71 mg/dL (ref 0.60–0.93)
Glucose, Bld: 253 mg/dL — ABNORMAL HIGH (ref 65–99)
Potassium: 4.3 mmol/L (ref 3.5–5.3)
Sodium: 138 mmol/L (ref 135–146)

## 2016-07-06 NOTE — Telephone Encounter (Signed)
Patient has an appointment to come this afternoon to have drawn.

## 2016-07-06 NOTE — Telephone Encounter (Signed)
Yes, we need the bmp.  The concern is for dehydration primarily.

## 2016-07-07 LAB — CBC WITH DIFFERENTIAL/PLATELET

## 2016-07-12 DIAGNOSIS — E119 Type 2 diabetes mellitus without complications: Secondary | ICD-10-CM | POA: Diagnosis not present

## 2016-07-12 DIAGNOSIS — R627 Adult failure to thrive: Secondary | ICD-10-CM | POA: Diagnosis not present

## 2016-07-12 DIAGNOSIS — I10 Essential (primary) hypertension: Secondary | ICD-10-CM | POA: Diagnosis not present

## 2016-07-12 DIAGNOSIS — F0390 Unspecified dementia without behavioral disturbance: Secondary | ICD-10-CM | POA: Diagnosis not present

## 2016-07-12 DIAGNOSIS — Z8673 Personal history of transient ischemic attack (TIA), and cerebral infarction without residual deficits: Secondary | ICD-10-CM | POA: Diagnosis not present

## 2016-07-12 DIAGNOSIS — E785 Hyperlipidemia, unspecified: Secondary | ICD-10-CM | POA: Diagnosis not present

## 2016-07-13 ENCOUNTER — Other Ambulatory Visit (HOSPITAL_BASED_OUTPATIENT_CLINIC_OR_DEPARTMENT_OTHER): Payer: Medicare Other

## 2016-07-13 ENCOUNTER — Ambulatory Visit (HOSPITAL_BASED_OUTPATIENT_CLINIC_OR_DEPARTMENT_OTHER): Payer: Medicare Other | Admitting: Hematology & Oncology

## 2016-07-13 VITALS — BP 137/75 | HR 97 | Temp 98.7°F | Resp 20

## 2016-07-13 DIAGNOSIS — I639 Cerebral infarction, unspecified: Secondary | ICD-10-CM | POA: Diagnosis not present

## 2016-07-13 DIAGNOSIS — Z86711 Personal history of pulmonary embolism: Secondary | ICD-10-CM | POA: Diagnosis not present

## 2016-07-13 DIAGNOSIS — Z86718 Personal history of other venous thrombosis and embolism: Secondary | ICD-10-CM | POA: Diagnosis not present

## 2016-07-13 LAB — CBC WITH DIFFERENTIAL (CANCER CENTER ONLY)
BASO#: 0 10*3/uL (ref 0.0–0.2)
BASO%: 0.2 % (ref 0.0–2.0)
EOS%: 0.5 % (ref 0.0–7.0)
Eosinophils Absolute: 0 10*3/uL (ref 0.0–0.5)
HEMATOCRIT: 36.3 % (ref 34.8–46.6)
HEMOGLOBIN: 11.5 g/dL — AB (ref 11.6–15.9)
LYMPH#: 1.5 10*3/uL (ref 0.9–3.3)
LYMPH%: 26 % (ref 14.0–48.0)
MCH: 28.7 pg (ref 26.0–34.0)
MCHC: 31.7 g/dL — ABNORMAL LOW (ref 32.0–36.0)
MCV: 91 fL (ref 81–101)
MONO#: 0.6 10*3/uL (ref 0.1–0.9)
MONO%: 10.4 % (ref 0.0–13.0)
NEUT%: 62.9 % (ref 39.6–80.0)
NEUTROS ABS: 3.5 10*3/uL (ref 1.5–6.5)
Platelets: 238 10*3/uL (ref 145–400)
RBC: 4.01 10*6/uL (ref 3.70–5.32)
RDW: 12.4 % (ref 11.1–15.7)
WBC: 5.6 10*3/uL (ref 3.9–10.0)

## 2016-07-13 LAB — COMPREHENSIVE METABOLIC PANEL (CC13)
ALT: 11 IU/L (ref 0–32)
AST: 12 IU/L (ref 0–40)
Albumin, Serum: 3.6 g/dL (ref 3.5–4.8)
Albumin/Globulin Ratio: 1.1 — ABNORMAL LOW (ref 1.2–2.2)
Alkaline Phosphatase, S: 77 IU/L (ref 39–117)
BUN/Creatinine Ratio: 40 — ABNORMAL HIGH (ref 12–28)
BUN: 24 mg/dL (ref 8–27)
Bilirubin Total: <0.2 mg/dL (ref 0.0–1.2)
CALCIUM: 9.9 mg/dL (ref 8.7–10.3)
CO2: 25 mmol/L (ref 18–29)
CREATININE: 0.6 mg/dL (ref 0.57–1.00)
Chloride, Ser: 98 mmol/L (ref 96–106)
GFR calc Af Amer: 101 mL/min/{1.73_m2} (ref >59)
GFR, EST NON AFRICAN AMERICAN: 88 mL/min/{1.73_m2} (ref >59)
GLOBULIN, TOTAL: 3.3 g/dL (ref 1.5–4.5)
Glucose: 189 mg/dL — ABNORMAL HIGH (ref 65–99)
Potassium, Ser: 4.3 mmol/L (ref 3.5–5.2)
SODIUM: 133 mmol/L — AB (ref 134–144)
Total Protein: 6.9 g/dL (ref 6.0–8.5)

## 2016-07-13 NOTE — Progress Notes (Signed)
Hematology and Oncology Follow Up Visit  [name] [id number] 1936/02/17 81 y.o. 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 We saw her back in January. At that time, she was on Coumadin. She been on Coumadin for 20 years. She had been on estrogens.  She was switched over to Xarelto. I thought this to be a whole lot easier for her. She is unwell with Xarelto. She's had no problems with bleeding. She's had no nausea or vomiting. She's had no obvious change in bowel or bladder habits.   Her birthday is coming up on Easter Sunday. I am very excited about this.  She's had no fever. She's had no leg swelling.  Overall, her performance status is ECOG 3.  Medications:  Current Outpatient Prescriptions:  .  acetaminophen (TYLENOL) 500 MG tablet, Take 500 mg by mouth every 6 (six) hours as needed., Disp: , Rfl:  .  atorvastatin (LIPITOR) 20 MG tablet, Take 20 mg by mouth daily at 6 PM. , Disp: , Rfl:  .  B Complex Vitamins (VITAMIN B COMPLEX PO), Take by mouth daily., Disp: , Rfl:  .  cholecalciferol (VITAMIN D) 1000 units tablet, Take 1,000 Units by mouth daily., Disp: , Rfl:  .  cloNIDine (CATAPRES) 0.1 MG tablet, Take 0.1 mg by mouth 2 (two) times daily., Disp: , Rfl:  .  Dexlansoprazole (DEXILANT) 30 MG capsule, Take 30 mg by mouth daily., Disp: , Rfl:  .  gabapentin (NEURONTIN) 300 MG capsule, Take 300 mg by mouth 3 (three) times daily., Disp: , Rfl:  .  metFORMIN (GLUCOPHAGE) 500 MG tablet, Take 1 tablet (500 mg total) by mouth 2 (two) times daily. (Patient taking differently: Take 500 mg by mouth. ), Disp: 180 tablet, Rfl: 1 .  Omega 3 1200 MG CAPS, Take by mouth daily., Disp: , Rfl:  .  Probiotic Product (PROBIOTIC PO), Take by mouth daily., Disp: , Rfl:  .  Rivaroxaban (XARELTO) 15 MG TABS tablet,  Take 1 tablet (15 mg total) by mouth daily with supper. 90 day supply, Disp: 90 tablet, Rfl: 3  Allergies: No Known Allergies  Past Medical History, Surgical history, Social history, and Family History were reviewed and updated.  Review of Systems:  As above  Physical Exam:  oral temperature is 98.7 F (37.1 C). Her blood pressure is 137/75 and her pulse is 97. Her respiration is 20.   Wt Readings from Last 3 Encounters:  06/25/16 110 lb (49.9 kg)  05/11/16 110 lb 3.2 oz (50 kg)  03/21/11 206 lb (93.4 kg)      Elderly African-American female in no obvious distress. Head exam shows no ocular or oral lesions. She has no palpable cervical or supraclavicular lymph nodes. Lungs are clear bilaterally. Cardiac exam regular rate and rhythm with occasional extra beat. Abdomen is soft. She has good bowel sounds. There is no fluid wave. There is no palpable liver or spleen tip. Back exam shows no tenderness over the spine, ribs or hips. Extremities shows some chronic edema in her lower legs. She has some venous stasis in the lower legs. She has no obvious venous cord. Neurological exam shows no focal neurological deficits.  Lab Results  Component Value Date   WBC 5.6 07/13/2016   HGB 11.5 (L) 07/13/2016   HCT 36.3  07/13/2016   MCV 91 07/13/2016   PLT 238 07/13/2016     Chemistry      Component Value Date/Time   NA 138 07/04/2016 1548   NA 140 06/01/2016 1400   K 4.3 07/04/2016 1548   K 4.1 06/01/2016 1400   CL 99 07/04/2016 1548   CL 103 06/01/2016 1400   CO2 23 07/04/2016 1548   CO2 27 06/01/2016 1400   BUN 22 07/04/2016 1548   BUN 21 06/01/2016 1400   CREATININE 0.71 07/04/2016 1548   GLU 140 03/30/2016      Component Value Date/Time   CALCIUM 9.3 07/04/2016 1548   CALCIUM 9.2 06/01/2016 1400   ALKPHOS 64 06/01/2016 1400   AST 19 06/01/2016 1400   ALT 25 06/01/2016 1400   BILITOT 0.60 06/01/2016 1400         Impression and Plan: [name] is A 81 year old African  American female. She has history of thromboembolic disease. I think all of her tests come back normal. Regardless, she will need lifelong anticoagulation. I think that we can ultimately get her on 10 mg of Xarelto. I think that this is very reasonable and safe for her.  I'll plan to see her back in 3 months.   [name], MD 3/7/20184:59 PM

## 2016-07-14 LAB — D-DIMER, QUANTITATIVE (NOT AT ARMC): D-DIMER: 0.52 mg{FEU}/L — AB (ref 0.00–0.49)

## 2016-07-15 DIAGNOSIS — F0390 Unspecified dementia without behavioral disturbance: Secondary | ICD-10-CM | POA: Diagnosis not present

## 2016-07-15 DIAGNOSIS — R627 Adult failure to thrive: Secondary | ICD-10-CM | POA: Diagnosis not present

## 2016-07-15 DIAGNOSIS — I10 Essential (primary) hypertension: Secondary | ICD-10-CM | POA: Diagnosis not present

## 2016-07-15 DIAGNOSIS — E785 Hyperlipidemia, unspecified: Secondary | ICD-10-CM | POA: Diagnosis not present

## 2016-07-15 DIAGNOSIS — Z8673 Personal history of transient ischemic attack (TIA), and cerebral infarction without residual deficits: Secondary | ICD-10-CM | POA: Diagnosis not present

## 2016-07-15 DIAGNOSIS — E119 Type 2 diabetes mellitus without complications: Secondary | ICD-10-CM | POA: Diagnosis not present

## 2016-07-19 ENCOUNTER — Other Ambulatory Visit: Payer: Self-pay

## 2016-07-19 ENCOUNTER — Ambulatory Visit: Payer: Medicare Other

## 2016-07-19 ENCOUNTER — Telehealth: Payer: Self-pay

## 2016-07-19 DIAGNOSIS — E119 Type 2 diabetes mellitus without complications: Secondary | ICD-10-CM | POA: Diagnosis not present

## 2016-07-19 DIAGNOSIS — Z8673 Personal history of transient ischemic attack (TIA), and cerebral infarction without residual deficits: Secondary | ICD-10-CM | POA: Diagnosis not present

## 2016-07-19 DIAGNOSIS — E785 Hyperlipidemia, unspecified: Secondary | ICD-10-CM | POA: Diagnosis not present

## 2016-07-19 DIAGNOSIS — L918 Other hypertrophic disorders of the skin: Secondary | ICD-10-CM

## 2016-07-19 DIAGNOSIS — I10 Essential (primary) hypertension: Secondary | ICD-10-CM | POA: Diagnosis not present

## 2016-07-19 DIAGNOSIS — F0390 Unspecified dementia without behavioral disturbance: Secondary | ICD-10-CM | POA: Diagnosis not present

## 2016-07-19 DIAGNOSIS — R627 Adult failure to thrive: Secondary | ICD-10-CM | POA: Diagnosis not present

## 2016-07-19 NOTE — Telephone Encounter (Signed)
I received a call from [place] with [facility] stating that patient was being discharged from their service today but there was an issue over needing direction on how to deal with a large skin tag in patient's vaginal area.   Lauren stated that the skin tag is about 1 1/2 cm in length and 1 cm wide and the tip of the tag is extremely irritated. Lauren stated the skin tag is irritated even more every time patient uses restroom. The nursing team seems to believe that skin tag needs to be removed but they would like providers recommendation on treatment/care so that they can advise the family.   Please advise.

## 2016-07-19 NOTE — Telephone Encounter (Signed)
Refer to GYN for vaginal area skin tag

## 2016-07-19 NOTE — Telephone Encounter (Signed)
I spoke with [name] [facility] health care nurse) to let her know that patient will be referred to GYN for evaluation of the skin tag. [name] verbalized understanding and asked that I call patient's daughter, [name].   I left a message on [name]'s voicemail asking that she call the office to discuss recommendations for skin tag.  Note: [name] called back and we discussed the referral. She was agreeable to the referral and asked that the referral be sent to [facility] and that she be contacted to schedule the appointment.   Order for GYN was placed.

## 2016-07-20 DIAGNOSIS — E119 Type 2 diabetes mellitus without complications: Secondary | ICD-10-CM | POA: Diagnosis not present

## 2016-07-20 DIAGNOSIS — R627 Adult failure to thrive: Secondary | ICD-10-CM | POA: Diagnosis not present

## 2016-07-20 DIAGNOSIS — Z86718 Personal history of other venous thrombosis and embolism: Secondary | ICD-10-CM | POA: Diagnosis not present

## 2016-07-20 DIAGNOSIS — Z8673 Personal history of transient ischemic attack (TIA), and cerebral infarction without residual deficits: Secondary | ICD-10-CM | POA: Diagnosis not present

## 2016-07-20 DIAGNOSIS — F0390 Unspecified dementia without behavioral disturbance: Secondary | ICD-10-CM | POA: Diagnosis not present

## 2016-07-20 DIAGNOSIS — Z86711 Personal history of pulmonary embolism: Secondary | ICD-10-CM | POA: Diagnosis not present

## 2016-07-20 DIAGNOSIS — Z7901 Long term (current) use of anticoagulants: Secondary | ICD-10-CM | POA: Diagnosis not present

## 2016-07-20 DIAGNOSIS — I10 Essential (primary) hypertension: Secondary | ICD-10-CM | POA: Diagnosis not present

## 2016-07-20 DIAGNOSIS — E785 Hyperlipidemia, unspecified: Secondary | ICD-10-CM | POA: Diagnosis not present

## 2016-07-20 DIAGNOSIS — Z5181 Encounter for therapeutic drug level monitoring: Secondary | ICD-10-CM | POA: Diagnosis not present

## 2016-07-22 DIAGNOSIS — R627 Adult failure to thrive: Secondary | ICD-10-CM | POA: Diagnosis not present

## 2016-07-22 DIAGNOSIS — I10 Essential (primary) hypertension: Secondary | ICD-10-CM | POA: Diagnosis not present

## 2016-07-22 DIAGNOSIS — Z8673 Personal history of transient ischemic attack (TIA), and cerebral infarction without residual deficits: Secondary | ICD-10-CM | POA: Diagnosis not present

## 2016-07-22 DIAGNOSIS — E785 Hyperlipidemia, unspecified: Secondary | ICD-10-CM | POA: Diagnosis not present

## 2016-07-22 DIAGNOSIS — F0390 Unspecified dementia without behavioral disturbance: Secondary | ICD-10-CM | POA: Diagnosis not present

## 2016-07-22 DIAGNOSIS — E119 Type 2 diabetes mellitus without complications: Secondary | ICD-10-CM | POA: Diagnosis not present

## 2016-07-25 ENCOUNTER — Ambulatory Visit (INDEPENDENT_AMBULATORY_CARE_PROVIDER_SITE_OTHER): Payer: Medicare Other | Admitting: Obstetrics and Gynecology

## 2016-07-25 ENCOUNTER — Encounter: Payer: Self-pay | Admitting: Obstetrics and Gynecology

## 2016-07-25 VITALS — BP 142/80 | HR 76 | Resp 12

## 2016-07-25 DIAGNOSIS — I639 Cerebral infarction, unspecified: Secondary | ICD-10-CM | POA: Diagnosis not present

## 2016-07-25 DIAGNOSIS — N909 Noninflammatory disorder of vulva and perineum, unspecified: Secondary | ICD-10-CM

## 2016-07-25 DIAGNOSIS — N9089 Other specified noninflammatory disorders of vulva and perineum: Secondary | ICD-10-CM

## 2016-07-25 DIAGNOSIS — D1772 Benign lipomatous neoplasm of other genitourinary organ: Secondary | ICD-10-CM | POA: Diagnosis not present

## 2016-07-25 HISTORY — PX: SKIN TAG REMOVAL: SHX780

## 2016-07-25 NOTE — Patient Instructions (Signed)
Vulvar Biopsy Post-procedure Instructions . You may take Ibuprofen, Aleve, or Tylenol for any pain or discomfort. . You may have a small amount of spotting.  You should wear a mini pad for the next few days. . You may use some topical Neosporin ointment, Vaseline or A&D ointment (over the counter) if you would like.   Marland Kitchen You will need to call the office if you have redness around the biopsy site, if there is any unusual drainage, if the bleeding is heavy, or if you have any concerns.   . Shower or bathe as normal . You will be notified within one week of your biopsy results or we will discuss your results at your follow-up appointment if needed.

## 2016-07-25 NOTE — Progress Notes (Signed)
81 y.o. [id number] WidowedAfrican AmericanF here with her daughter in law (daughter in law giving history) for c/o vaginal skin tag.  The patient is in a wheelchair, is incontinent and wears depends. Her care takers have noticed significant irritation on a large vulvar skin tag. The patient states that she is sore "down there".     No LMP recorded. Patient is postmenopausal.          Sexually active: No.  The current method of family planning is post menopausal status.    Exercising: No.  The patient does not participate in regular exercise at present. Smoker:  no  Health Maintenance: Pap:  Unsure  History of abnormal Pap:  Unsure of PAP history MMG:  Unsure Colonoscopy:  unsure BMD:   unsure TDaP:  Unsure     reports that she has never smoked. She has never used smokeless tobacco. She reports that she does not drink alcohol or use drugs.  Past Medical History:  Diagnosis Date  . Dementia   . Diabetes mellitus type II 03/21/2011  . DVT (deep venous thrombosis) ([place]) 03/21/2011  . Gallstones   . Hyperlipidemia 03/21/2011  . Hypertension 03/21/2011  . Lymphedema of leg 03/21/2011  . Pulmonary embolism ([place]) 03/21/2011    Past Surgical History:  Procedure Laterality Date  . CHOLECYSTECTOMY  2015  . VAGINAL HYSTERECTOMY     unknown of date  . VASCULAR SURGERY      Current Outpatient Prescriptions  Medication Sig Dispense Refill  . acetaminophen (TYLENOL) 500 MG tablet Take 500 mg by mouth every 6 (six) hours as needed.    Marland Kitchen atorvastatin (LIPITOR) 20 MG tablet Take 20 mg by mouth daily at 6 PM.     . B Complex Vitamins (VITAMIN B COMPLEX PO) Take by mouth daily.    . cholecalciferol (VITAMIN D) 1000 units tablet Take 5,000 Units by mouth daily.     . cloNIDine (CATAPRES) 0.1 MG tablet Take 0.1 mg by mouth 2 (two) times daily.    Marland Kitchen Dexlansoprazole (DEXILANT) 30 MG capsule Take 30 mg by mouth daily.    Marland Kitchen gabapentin (NEURONTIN) 300 MG capsule Take 300 mg by mouth 3 (three) times  daily.    . metFORMIN (GLUCOPHAGE) 500 MG tablet Take 1 tablet (500 mg total) by mouth 2 (two) times daily. (Patient taking differently: Take 500 mg by mouth. ) 180 tablet 1  . Omega 3 1200 MG CAPS Take by mouth daily.    . Probiotic Product (PROBIOTIC PO) Take by mouth daily.    . Rivaroxaban (XARELTO) 15 MG TABS tablet Take 1 tablet (15 mg total) by mouth daily with supper. 90 day supply 90 tablet 3   No current facility-administered medications for this visit.     Family History  Problem Relation Age of Onset  . Heart attack Mother   . COPD Sister   . Stroke Sister   . Bipolar disorder Daughter     Review of Systems  Constitutional: Negative.   HENT: Negative.   Eyes: Negative.   Respiratory: Negative.   Cardiovascular: Negative.   Gastrointestinal: Negative.   Endocrine: Negative.   Genitourinary: Negative.        Vaginal skin tag   Musculoskeletal: Negative.   Skin: Negative.   Allergic/Immunologic: Negative.   Neurological: Negative.   Psychiatric/Behavioral: Negative.     Exam:   BP (!) 142/80 (BP Location: Left Arm, Patient Position: Sitting, Cuff Size: Normal)   Pulse 76   Resp 12  Weight change: @WEIGHTCHANGE @ Height:      Ht Readings from Last 3 Encounters:  06/25/16 5\' 7"  (1.702 m)  05/11/16 5\' 5"  (1.651 m)  03/21/11 5\' 6"  (1.676 m)    General appearance: alert, cooperative and appears stated age   Pelvic: External genitalia:  2 x 1.5 cm protruding mass from the left lower vulva, upper buttock, ulcerated at the distal end of it.               Urethra:  normal appearing urethra with no masses, tenderness or lesions              Bartholins and Skenes: normal      The risks of the procedure were reviewed with the patient and a consent was signed. The area was cleansed with betadine and injected with 1% lidocaine. A #11 blade was used to remove the mass.  The defect was closed with 4 separate stiches using 4-0 vicryl (one deep and 3 superficial). The  patient tolerated the procedure well.                 Chaperone was present for exam.  A:  Ulcerated vulvar mass  P:   Removed and sent to pathology  CC: Dr Eulas Post

## 2016-07-26 ENCOUNTER — Ambulatory Visit (INDEPENDENT_AMBULATORY_CARE_PROVIDER_SITE_OTHER): Payer: Medicare Other

## 2016-07-26 VITALS — BP 130/64 | HR 86 | Temp 98.5°F | Ht 67.0 in

## 2016-07-26 DIAGNOSIS — I10 Essential (primary) hypertension: Secondary | ICD-10-CM | POA: Diagnosis not present

## 2016-07-26 DIAGNOSIS — R627 Adult failure to thrive: Secondary | ICD-10-CM | POA: Diagnosis not present

## 2016-07-26 DIAGNOSIS — Z135 Encounter for screening for eye and ear disorders: Secondary | ICD-10-CM | POA: Diagnosis not present

## 2016-07-26 DIAGNOSIS — E119 Type 2 diabetes mellitus without complications: Secondary | ICD-10-CM | POA: Diagnosis not present

## 2016-07-26 DIAGNOSIS — Z0000 Encounter for general adult medical examination without abnormal findings: Secondary | ICD-10-CM | POA: Diagnosis not present

## 2016-07-26 DIAGNOSIS — F0390 Unspecified dementia without behavioral disturbance: Secondary | ICD-10-CM | POA: Diagnosis not present

## 2016-07-26 DIAGNOSIS — Z8673 Personal history of transient ischemic attack (TIA), and cerebral infarction without residual deficits: Secondary | ICD-10-CM | POA: Diagnosis not present

## 2016-07-26 DIAGNOSIS — E785 Hyperlipidemia, unspecified: Secondary | ICD-10-CM | POA: Diagnosis not present

## 2016-07-26 NOTE — Progress Notes (Signed)
Quick Notes   Health Maintenance: Pt due for pn13/23, flu, TDAP, shingles, eye exam, mammogram, colonoscopy. Put in referral for eye exam. Pt daughter declined vaccines and stated that she had brought in medical records in January, but are not in the chart yet.      Abnormal Screen: MMSE and clock drawing unable to complete due to dementia     Patient Concerns: None     Nurse Concerns: None

## 2016-07-26 NOTE — Progress Notes (Signed)
Subjective:   [name] is a 81 y.o. female who presents for an Initial Medicare Annual Wellness Visit.     Objective:    Today's Vitals   07/26/16 1435  BP: 130/64  Pulse: 86  Temp: 98.5 F (36.9 C)  TempSrc: Oral  SpO2: 99%  Height: 5\' 7"  (1.702 m)   There is no height or weight on file to calculate BMI.   Current Medications (verified) Outpatient Encounter Prescriptions as of 07/26/2016  Medication Sig  . acetaminophen (TYLENOL) 500 MG tablet Take 500 mg by mouth every 6 (six) hours as needed.  Marland Kitchen atorvastatin (LIPITOR) 20 MG tablet Take 20 mg by mouth daily at 6 PM.   . B Complex Vitamins (VITAMIN B COMPLEX PO) Take by mouth daily.  . cholecalciferol (VITAMIN D) 1000 units tablet Take 5,000 Units by mouth daily.   . cloNIDine (CATAPRES) 0.1 MG tablet Take 0.1 mg by mouth 2 (two) times daily.  Marland Kitchen Dexlansoprazole (DEXILANT) 30 MG capsule Take 30 mg by mouth daily.  Marland Kitchen gabapentin (NEURONTIN) 300 MG capsule Take 300 mg by mouth 3 (three) times daily.  . metFORMIN (GLUCOPHAGE) 1000 MG tablet Take 1,000 mg by mouth daily with breakfast.  . metFORMIN (GLUCOPHAGE) 500 MG tablet Take 500 mg by mouth at bedtime.  . Omega 3 1200 MG CAPS Take by mouth daily.  . Probiotic Product (PROBIOTIC PO) Take by mouth daily.  . Rivaroxaban (XARELTO) 15 MG TABS tablet Take 1 tablet (15 mg total) by mouth daily with supper. 90 day supply  . [DISCONTINUED] metFORMIN (GLUCOPHAGE) 500 MG tablet Take 1 tablet (500 mg total) by mouth 2 (two) times daily. (Patient taking differently: Take 500 mg by mouth. )   No facility-administered encounter medications on file as of 07/26/2016.     Allergies (verified) Patient has no known allergies.   History: Past Medical History:  Diagnosis Date  . Dementia   . Diabetes mellitus type II 03/21/2011  . DVT (deep venous thrombosis) ([place]) 03/21/2011  . Gallstones   . Hyperlipidemia 03/21/2011  . Hypertension 03/21/2011  . Lymphedema of leg 03/21/2011  .  Pulmonary embolism ([place]) 03/21/2011   Past Surgical History:  Procedure Laterality Date  . CHOLECYSTECTOMY  2015  . SKIN TAG REMOVAL  07/25/2016  . VAGINAL HYSTERECTOMY     unknown of date  . VASCULAR SURGERY     Family History  Problem Relation Age of Onset  . Heart attack Mother   . COPD Sister   . Stroke Sister   . Bipolar disorder Daughter    Social History   Occupational History  . Not on file.   Social History Main Topics  . Smoking status: Never Smoker  . Smokeless tobacco: Never Used  . Alcohol use No  . Drug use: No  . Sexual activity: No    Tobacco Counseling Counseling given: Not Answered   Activities of Daily Living In your present state of health, do you have any difficulty performing the following activities: 07/26/2016 05/12/2016  Hearing? N N  Vision? N N  Difficulty concentrating or making decisions? Tempie Donning  Walking or climbing stairs? Y Y  Dressing or bathing? Y Y  Doing errands, shopping? Tempie Donning  Preparing Food and eating ? Y -  Using the Toilet? Y -  In the past six months, have you accidently leaked urine? Y -  Do you have problems with loss of bowel control? N -  Managing your Medications? Y -  Managing your  Finances? Y -  Housekeeping or managing your Housekeeping? Y -  Some recent data might be hidden    Immunizations and Health Maintenance  There is no immunization history on file for this patient. Health Maintenance Due  Topic Date Due  . FOOT EXAM  08/08/1945  . OPHTHALMOLOGY EXAM  08/08/1945  . URINE MICROALBUMIN  08/08/1947  . TETANUS/TDAP  08/07/1956  . DEXA SCAN  08/08/2002  . PNA vac Low Risk Adult (1 of 2 - PCV13) 08/08/2002    Patient Care Team: Gildardo Cranker, DO as PCP - General (Internal Medicine)  Indicate any recent Medical Services you may have received from other than [facility] providers in the past year (date may be approximate).     Assessment:   This is a routine wellness examination for [employer].   Hearing/Vision  screen No exam data present  Dietary issues and exercise activities discussed: Current Exercise Habits: Home exercise routine (PT and OT and home strengthening), Type of exercise: Other - see comments, Time (Minutes): 20, Frequency (Times/Week): 2, Weekly Exercise (Minutes/Week): 40, Intensity: Mild, Exercise limited by: neurologic condition(s) (dementia)  Goals    . Increase strength          Starting 07/26/16 I will increase my mobility and strength with OT and PT.      Depression Screen PHQ 2/9 Scores 07/26/2016 05/18/2016  PHQ - 2 Score 0 0    Fall Risk Fall Risk  07/26/2016 07/04/2016 05/18/2016  Falls in the past year? No No Yes  Number falls in past yr: - - 2 or more  Injury with Fall? - - No    Cognitive Function: MMSE - Mini Mental State Exam 07/26/2016  Not completed: Unable to complete        Screening Tests Health Maintenance  Topic Date Due  . FOOT EXAM  08/08/1945  . OPHTHALMOLOGY EXAM  08[birth year]08/1945  . URINE MICROALBUMIN  08[birth year]08/1947  . TETANUS/TDAP  08/07/1956  . DEXA SCAN  08/08/2002  . PNA vac Low Risk Adult (1 of 2 - PCV13) 08/08/2002  . INFLUENZA VACCINE  05/24/2017 (Originally 12/08/2015)  . HEMOGLOBIN A1C  11/08/2016      Plan:  I have personally reviewed and addressed the Medicare Annual Wellness questionnaire and have noted the following in the patient's chart:  A. Medical and social history B. Use of alcohol, tobacco or illicit drugs  C. Current medications and supplements D. Functional ability and status E.  Nutritional status F.  Physical activity G. Advance directives H. List of other physicians I.  Hospitalizations, surgeries, and ER visits in previous 12 months J.  [place] to include hearing, vision, cognitive, depression L. Referrals and appointments - eye exam referral  In addition, I have reviewed and discussed with patient certain preventive protocols, quality metrics, and best practice recommendations. A written  personalized care plan for preventive services as well as general preventive health recommendations were provided to patient.  See attached scanned questionnaire for additional information.   Signed,   Rich Reining, RN Nurse Health Advisor  I reviewed health advisors note, was available for consultation and agree with documentation and plan.  Carlos American. Harle Battiest  [facility] [phone] am - 5 pm) [phone] (after hours)

## 2016-07-26 NOTE — Patient Instructions (Signed)
[name] , Thank you for taking time to come for your Medicare Wellness Visit. I appreciate your ongoing commitment to your health goals. Please review the following plan we discussed and let me know if I can assist you in the future.   Screening recommendations/referrals: Colonoscopy due. In need of records if had previously Mammogram due, In need of records if had previously Bone Density. In need of records if had previously Recommended yearly ophthalmology/optometry visit for glaucoma screening and checkup Recommended yearly dental visit for hygiene and checkup  Vaccinations: Influenza vaccine due. Pt declined Pneumococcal vaccine pt declined. Tdap vaccine pt declined. Shingles vaccine declined.   Advanced directives: In chart  Conditions/risks identified: None  Next appointment: Eulas Post 5/23 @2 :15 pm   Preventive Care 65 Years and Older, Female Preventive care refers to lifestyle choices and visits with your health care provider that can promote health and wellness. What does preventive care include?  A yearly physical exam. This is also called an annual well check.  Dental exams once or twice a year.  Routine eye exams. Ask your health care provider how often you should have your eyes checked.  Personal lifestyle choices, including:  Daily care of your teeth and gums.  Regular physical activity.  Eating a healthy diet.  Avoiding tobacco and drug use.  Limiting alcohol use.  Practicing safe sex.  Taking low-dose aspirin every day.  Taking vitamin and mineral supplements as recommended by your health care provider. What happens during an annual well check? The services and screenings done by your health care provider during your annual well check will depend on your age, overall health, lifestyle risk factors, and family history of disease. Counseling  Your health care provider may ask you questions about your:  Alcohol use.  Tobacco use.  Drug  use.  Emotional well-being.  Home and relationship well-being.  Sexual activity.  Eating habits.  History of falls.  Memory and ability to understand (cognition).  Work and work Statistician.  Reproductive health. Screening  You may have the following tests or measurements:  Height, weight, and BMI.  Blood pressure.  Lipid and cholesterol levels. These may be checked every 5 years, or more frequently if you are over 35 years old.  Skin check.  Lung cancer screening. You may have this screening every year starting at age 41 if you have a 30-pack-year history of smoking and currently smoke or have quit within the past 15 years.  Fecal occult blood test (FOBT) of the stool. You may have this test every year starting at age 75.  Flexible sigmoidoscopy or colonoscopy. You may have a sigmoidoscopy every 5 years or a colonoscopy every 10 years starting at age 22.  Hepatitis C blood test.  Hepatitis B blood test.  Sexually transmitted disease (STD) testing.  Diabetes screening. This is done by checking your blood sugar (glucose) after you have not eaten for a while (fasting). You may have this done every 1-3 years.  Bone density scan. This is done to screen for osteoporosis. You may have this done starting at age 70.  Mammogram. This may be done every 1-2 years. Talk to your health care provider about how often you should have regular mammograms. Talk with your health care provider about your test results, treatment options, and if necessary, the need for more tests. Vaccines  Your health care provider may recommend certain vaccines, such as:  Influenza vaccine. This is recommended every year.  Tetanus, diphtheria, and acellular pertussis (Tdap, Td)  vaccine. You may need a Td booster every 10 years.  Zoster vaccine. You may need this after age 35.  Pneumococcal 13-valent conjugate (PCV13) vaccine. One dose is recommended after age 32.  Pneumococcal polysaccharide  (PPSV23) vaccine. One dose is recommended after age 60. Talk to your health care provider about which screenings and vaccines you need and how often you need them. This information is not intended to replace advice given to you by your health care provider. Make sure you discuss any questions you have with your health care provider. Document Released: 05/22/2015 Document Revised: 01/13/2016 Document Reviewed: 02/24/2015 Elsevier Interactive Patient Education  2017 [employer] Prevention in the Home Falls can cause injuries. They can happen to people of all ages. There are many things you can do to make your home safe and to help prevent falls. What can I do on the outside of my home?  Regularly fix the edges of walkways and driveways and fix any cracks.  Remove anything that might make you trip as you walk through a door, such as a raised step or threshold.  Trim any bushes or trees on the path to your home.  Use bright outdoor lighting.  Clear any walking paths of anything that might make someone trip, such as rocks or tools.  Regularly check to see if handrails are loose or broken. Make sure that both sides of any steps have handrails.  Any raised decks and porches should have guardrails on the edges.  Have any leaves, snow, or ice cleared regularly.  Use sand or salt on walking paths during winter.  Clean up any spills in your garage right away. This includes oil or grease spills. What can I do in the bathroom?  Use night lights.  Install grab bars by the toilet and in the tub and shower. Do not use towel bars as grab bars.  Use non-skid mats or decals in the tub or shower.  If you need to sit down in the shower, use a plastic, non-slip stool.  Keep the floor dry. Clean up any water that spills on the floor as soon as it happens.  Remove soap buildup in the tub or shower regularly.  Attach bath mats securely with double-sided non-slip rug tape.  Do not have  throw rugs and other things on the floor that can make you trip. What can I do in the bedroom?  Use night lights.  Make sure that you have a light by your bed that is easy to reach.  Do not use any sheets or blankets that are too big for your bed. They should not hang down onto the floor.  Have a firm chair that has side arms. You can use this for support while you get dressed.  Do not have throw rugs and other things on the floor that can make you trip. What can I do in the kitchen?  Clean up any spills right away.  Avoid walking on wet floors.  Keep items that you use a lot in easy-to-reach places.  If you need to reach something above you, use a strong step stool that has a grab bar.  Keep electrical cords out of the way.  Do not use floor polish or wax that makes floors slippery. If you must use wax, use non-skid floor wax.  Do not have throw rugs and other things on the floor that can make you trip. What can I do with my stairs?  Do not leave  any items on the stairs.  Make sure that there are handrails on both sides of the stairs and use them. Fix handrails that are broken or loose. Make sure that handrails are as long as the stairways.  Check any carpeting to make sure that it is firmly attached to the stairs. Fix any carpet that is loose or worn.  Avoid having throw rugs at the top or bottom of the stairs. If you do have throw rugs, attach them to the floor with carpet tape.  Make sure that you have a light switch at the top of the stairs and the bottom of the stairs. If you do not have them, ask someone to add them for you. What else can I do to help prevent falls?  Wear shoes that:  Do not have high heels.  Have rubber bottoms.  Are comfortable and fit you well.  Are closed at the toe. Do not wear sandals.  If you use a stepladder:  Make sure that it is fully opened. Do not climb a closed stepladder.  Make sure that both sides of the stepladder are  locked into place.  Ask someone to hold it for you, if possible.  Clearly mark and make sure that you can see:  Any grab bars or handrails.  First and last steps.  Where the edge of each step is.  Use tools that help you move around (mobility aids) if they are needed. These include:  Canes.  Walkers.  Scooters.  Crutches.  Turn on the lights when you go into a dark area. Replace any light bulbs as soon as they burn out.  Set up your furniture so you have a clear path. Avoid moving your furniture around.  If any of your floors are uneven, fix them.  If there are any pets around you, be aware of where they are.  Review your medicines with your doctor. Some medicines can make you feel dizzy. This can increase your chance of falling. Ask your doctor what other things that you can do to help prevent falls. This information is not intended to replace advice given to you by your health care provider. Make sure you discuss any questions you have with your health care provider. Document Released: 02/19/2009 Document Revised: 10/01/2015 Document Reviewed: 05/30/2014 Elsevier Interactive Patient Education  2017 [employer].

## 2016-07-28 DIAGNOSIS — I10 Essential (primary) hypertension: Secondary | ICD-10-CM | POA: Diagnosis not present

## 2016-07-28 DIAGNOSIS — R627 Adult failure to thrive: Secondary | ICD-10-CM | POA: Diagnosis not present

## 2016-07-28 DIAGNOSIS — E785 Hyperlipidemia, unspecified: Secondary | ICD-10-CM | POA: Diagnosis not present

## 2016-07-28 DIAGNOSIS — Z8673 Personal history of transient ischemic attack (TIA), and cerebral infarction without residual deficits: Secondary | ICD-10-CM | POA: Diagnosis not present

## 2016-07-28 DIAGNOSIS — E119 Type 2 diabetes mellitus without complications: Secondary | ICD-10-CM | POA: Diagnosis not present

## 2016-07-28 DIAGNOSIS — F0390 Unspecified dementia without behavioral disturbance: Secondary | ICD-10-CM | POA: Diagnosis not present

## 2016-08-01 ENCOUNTER — Telehealth: Payer: Self-pay | Admitting: *Deleted

## 2016-08-01 NOTE — Telephone Encounter (Signed)
Left message with daughter in law to  Call regarding labs, and to see how patient is feeling -eh

## 2016-08-01 NOTE — Telephone Encounter (Signed)
Patient returning your call.

## 2016-08-01 NOTE — Telephone Encounter (Signed)
Patient's daughter-in-law returned [name]'s call and reports her mother-in-law is "healing well with no complaints and the area looks good." She also said, "Please tell [name] it is okay to leave me a detailed message with the results."

## 2016-08-01 NOTE — Telephone Encounter (Signed)
-----   Message from Salvadore Dom, MD sent at 08/01/2016  2:40 PM EDT ----- Please inform the patient (or her caretaker, check dpr) that the biopsy was benign and check on how she is feeling.

## 2016-08-01 NOTE — Telephone Encounter (Signed)
Spoke with patients daughter in law and gave results of BX. Per daughter in law patient is healing well and having no issues. -eh

## 2016-08-01 NOTE — Telephone Encounter (Signed)
Returned patients call- left another message to call back -eh 

## 2016-08-02 DIAGNOSIS — E785 Hyperlipidemia, unspecified: Secondary | ICD-10-CM | POA: Diagnosis not present

## 2016-08-02 DIAGNOSIS — F0390 Unspecified dementia without behavioral disturbance: Secondary | ICD-10-CM | POA: Diagnosis not present

## 2016-08-02 DIAGNOSIS — R627 Adult failure to thrive: Secondary | ICD-10-CM | POA: Diagnosis not present

## 2016-08-02 DIAGNOSIS — E119 Type 2 diabetes mellitus without complications: Secondary | ICD-10-CM | POA: Diagnosis not present

## 2016-08-02 DIAGNOSIS — Z8673 Personal history of transient ischemic attack (TIA), and cerebral infarction without residual deficits: Secondary | ICD-10-CM | POA: Diagnosis not present

## 2016-08-02 DIAGNOSIS — I10 Essential (primary) hypertension: Secondary | ICD-10-CM | POA: Diagnosis not present

## 2016-08-04 DIAGNOSIS — F0390 Unspecified dementia without behavioral disturbance: Secondary | ICD-10-CM | POA: Diagnosis not present

## 2016-08-04 DIAGNOSIS — E119 Type 2 diabetes mellitus without complications: Secondary | ICD-10-CM | POA: Diagnosis not present

## 2016-08-04 DIAGNOSIS — I10 Essential (primary) hypertension: Secondary | ICD-10-CM | POA: Diagnosis not present

## 2016-08-04 DIAGNOSIS — E785 Hyperlipidemia, unspecified: Secondary | ICD-10-CM | POA: Diagnosis not present

## 2016-08-04 DIAGNOSIS — Z8673 Personal history of transient ischemic attack (TIA), and cerebral infarction without residual deficits: Secondary | ICD-10-CM | POA: Diagnosis not present

## 2016-08-04 DIAGNOSIS — R627 Adult failure to thrive: Secondary | ICD-10-CM | POA: Diagnosis not present

## 2016-08-08 ENCOUNTER — Other Ambulatory Visit: Payer: Medicare Other

## 2016-08-09 DIAGNOSIS — F0390 Unspecified dementia without behavioral disturbance: Secondary | ICD-10-CM | POA: Diagnosis not present

## 2016-08-09 DIAGNOSIS — R627 Adult failure to thrive: Secondary | ICD-10-CM | POA: Diagnosis not present

## 2016-08-09 DIAGNOSIS — E785 Hyperlipidemia, unspecified: Secondary | ICD-10-CM | POA: Diagnosis not present

## 2016-08-09 DIAGNOSIS — E119 Type 2 diabetes mellitus without complications: Secondary | ICD-10-CM | POA: Diagnosis not present

## 2016-08-09 DIAGNOSIS — Z8673 Personal history of transient ischemic attack (TIA), and cerebral infarction without residual deficits: Secondary | ICD-10-CM | POA: Diagnosis not present

## 2016-08-09 DIAGNOSIS — I10 Essential (primary) hypertension: Secondary | ICD-10-CM | POA: Diagnosis not present

## 2016-08-10 ENCOUNTER — Ambulatory Visit: Payer: Medicare Other | Admitting: Internal Medicine

## 2016-08-10 ENCOUNTER — Telehealth: Payer: Self-pay | Admitting: Internal Medicine

## 2016-08-11 NOTE — Telephone Encounter (Signed)
Filled out basic info for provider. Placed in Dr. Hervey Ard review and sign folder.  Called patients daughter Cheryle Horsfall to inform her that Dr. Eulas Post is out of the office and Dr. Nyoka Cowden will be filling out her form. Patients daughter stated that she has to fill out same form and could help with date if provider needs some assistance since patient has not been with [facility] that long.

## 2016-08-12 ENCOUNTER — Telehealth: Payer: Self-pay

## 2016-08-12 NOTE — Telephone Encounter (Signed)
Noted, if this is ongoing, will need an appt

## 2016-08-12 NOTE — Telephone Encounter (Signed)
OT called and informed us that patient will be missing a OT visit due to mental status change, he stated that patient is sleeping an extra 6 hours a day. Please advise.

## 2016-08-12 NOTE — Telephone Encounter (Signed)
Spoke with [name] (daughter) she stated that patient has just been off for a few days and last night was the first night in 3 days she's had a good nights rest so [name] just wanted her to rest today. She didn't want to rush her out of bed for OT. She stated that mother is getting back to her self and that there is no concern.

## 2016-08-16 DIAGNOSIS — R627 Adult failure to thrive: Secondary | ICD-10-CM | POA: Diagnosis not present

## 2016-08-16 DIAGNOSIS — Z8673 Personal history of transient ischemic attack (TIA), and cerebral infarction without residual deficits: Secondary | ICD-10-CM | POA: Diagnosis not present

## 2016-08-16 DIAGNOSIS — F0390 Unspecified dementia without behavioral disturbance: Secondary | ICD-10-CM | POA: Diagnosis not present

## 2016-08-16 DIAGNOSIS — E785 Hyperlipidemia, unspecified: Secondary | ICD-10-CM | POA: Diagnosis not present

## 2016-08-16 DIAGNOSIS — E119 Type 2 diabetes mellitus without complications: Secondary | ICD-10-CM | POA: Diagnosis not present

## 2016-08-16 DIAGNOSIS — I10 Essential (primary) hypertension: Secondary | ICD-10-CM | POA: Diagnosis not present

## 2016-08-18 DIAGNOSIS — E119 Type 2 diabetes mellitus without complications: Secondary | ICD-10-CM | POA: Diagnosis not present

## 2016-08-18 DIAGNOSIS — F0390 Unspecified dementia without behavioral disturbance: Secondary | ICD-10-CM | POA: Diagnosis not present

## 2016-08-18 DIAGNOSIS — I10 Essential (primary) hypertension: Secondary | ICD-10-CM | POA: Diagnosis not present

## 2016-08-18 DIAGNOSIS — Z8673 Personal history of transient ischemic attack (TIA), and cerebral infarction without residual deficits: Secondary | ICD-10-CM | POA: Diagnosis not present

## 2016-08-18 DIAGNOSIS — R627 Adult failure to thrive: Secondary | ICD-10-CM | POA: Diagnosis not present

## 2016-08-18 DIAGNOSIS — E785 Hyperlipidemia, unspecified: Secondary | ICD-10-CM | POA: Diagnosis not present

## 2016-08-23 ENCOUNTER — Ambulatory Visit: Payer: Medicare Other | Admitting: Nurse Practitioner

## 2016-08-23 ENCOUNTER — Emergency Department (HOSPITAL_COMMUNITY): Payer: Medicare Other

## 2016-08-23 ENCOUNTER — Inpatient Hospital Stay (HOSPITAL_COMMUNITY)
Admission: EM | Admit: 2016-08-23 | Discharge: 2016-08-26 | DRG: 871 | Disposition: A | Discharge status: Home / Self Care | Payer: Medicare Other | Attending: Internal Medicine | Admitting: Internal Medicine

## 2016-08-23 ENCOUNTER — Encounter: Payer: Self-pay | Admitting: Internal Medicine

## 2016-08-23 ENCOUNTER — Encounter (HOSPITAL_COMMUNITY): Payer: Self-pay | Admitting: Emergency Medicine

## 2016-08-23 DIAGNOSIS — Z7984 Long term (current) use of oral hypoglycemic drugs: Secondary | ICD-10-CM | POA: Diagnosis not present

## 2016-08-23 DIAGNOSIS — A419 Sepsis, unspecified organism: Secondary | ICD-10-CM | POA: Diagnosis present

## 2016-08-23 DIAGNOSIS — R262 Difficulty in walking, not elsewhere classified: Secondary | ICD-10-CM

## 2016-08-23 DIAGNOSIS — Z86711 Personal history of pulmonary embolism: Secondary | ICD-10-CM | POA: Diagnosis not present

## 2016-08-23 DIAGNOSIS — E871 Hypo-osmolality and hyponatremia: Secondary | ICD-10-CM | POA: Diagnosis not present

## 2016-08-23 DIAGNOSIS — Z66 Do not resuscitate: Secondary | ICD-10-CM | POA: Diagnosis present

## 2016-08-23 DIAGNOSIS — R531 Weakness: Secondary | ICD-10-CM | POA: Diagnosis not present

## 2016-08-23 DIAGNOSIS — I10 Essential (primary) hypertension: Secondary | ICD-10-CM | POA: Diagnosis present

## 2016-08-23 DIAGNOSIS — Z7901 Long term (current) use of anticoagulants: Secondary | ICD-10-CM | POA: Diagnosis not present

## 2016-08-23 DIAGNOSIS — F0390 Unspecified dementia without behavioral disturbance: Secondary | ICD-10-CM | POA: Diagnosis not present

## 2016-08-23 DIAGNOSIS — N390 Urinary tract infection, site not specified: Secondary | ICD-10-CM | POA: Diagnosis not present

## 2016-08-23 DIAGNOSIS — G92 Toxic encephalopathy: Secondary | ICD-10-CM | POA: Diagnosis present

## 2016-08-23 DIAGNOSIS — Z86718 Personal history of other venous thrombosis and embolism: Secondary | ICD-10-CM

## 2016-08-23 DIAGNOSIS — Z818 Family history of other mental and behavioral disorders: Secondary | ICD-10-CM

## 2016-08-23 DIAGNOSIS — I890 Lymphedema, not elsewhere classified: Secondary | ICD-10-CM | POA: Diagnosis present

## 2016-08-23 DIAGNOSIS — R509 Fever, unspecified: Secondary | ICD-10-CM | POA: Diagnosis not present

## 2016-08-23 DIAGNOSIS — Z8249 Family history of ischemic heart disease and other diseases of the circulatory system: Secondary | ICD-10-CM

## 2016-08-23 DIAGNOSIS — E785 Hyperlipidemia, unspecified: Secondary | ICD-10-CM | POA: Diagnosis present

## 2016-08-23 DIAGNOSIS — Z9049 Acquired absence of other specified parts of digestive tract: Secondary | ICD-10-CM | POA: Diagnosis not present

## 2016-08-23 DIAGNOSIS — A4151 Sepsis due to Escherichia coli [E. coli]: Secondary | ICD-10-CM | POA: Diagnosis not present

## 2016-08-23 DIAGNOSIS — R4182 Altered mental status, unspecified: Secondary | ICD-10-CM | POA: Diagnosis not present

## 2016-08-23 DIAGNOSIS — E119 Type 2 diabetes mellitus without complications: Secondary | ICD-10-CM | POA: Diagnosis not present

## 2016-08-23 DIAGNOSIS — I69354 Hemiplegia and hemiparesis following cerebral infarction affecting left non-dominant side: Secondary | ICD-10-CM | POA: Diagnosis not present

## 2016-08-23 DIAGNOSIS — G9341 Metabolic encephalopathy: Secondary | ICD-10-CM | POA: Diagnosis not present

## 2016-08-23 DIAGNOSIS — R404 Transient alteration of awareness: Secondary | ICD-10-CM | POA: Diagnosis not present

## 2016-08-23 DIAGNOSIS — Z90710 Acquired absence of both cervix and uterus: Secondary | ICD-10-CM

## 2016-08-23 DIAGNOSIS — E11649 Type 2 diabetes mellitus with hypoglycemia without coma: Secondary | ICD-10-CM | POA: Diagnosis present

## 2016-08-23 DIAGNOSIS — E1165 Type 2 diabetes mellitus with hyperglycemia: Secondary | ICD-10-CM | POA: Diagnosis present

## 2016-08-23 DIAGNOSIS — Z79899 Other long term (current) drug therapy: Secondary | ICD-10-CM

## 2016-08-23 DIAGNOSIS — Z823 Family history of stroke: Secondary | ICD-10-CM

## 2016-08-23 DIAGNOSIS — E872 Acidosis: Secondary | ICD-10-CM | POA: Diagnosis present

## 2016-08-23 DIAGNOSIS — E860 Dehydration: Secondary | ICD-10-CM | POA: Diagnosis present

## 2016-08-23 DIAGNOSIS — B952 Enterococcus as the cause of diseases classified elsewhere: Secondary | ICD-10-CM

## 2016-08-23 DIAGNOSIS — R102 Pelvic and perineal pain: Secondary | ICD-10-CM | POA: Diagnosis not present

## 2016-08-23 DIAGNOSIS — Z825 Family history of asthma and other chronic lower respiratory diseases: Secondary | ICD-10-CM | POA: Diagnosis not present

## 2016-08-23 DIAGNOSIS — E1142 Type 2 diabetes mellitus with diabetic polyneuropathy: Secondary | ICD-10-CM

## 2016-08-23 DIAGNOSIS — G9340 Encephalopathy, unspecified: Secondary | ICD-10-CM | POA: Diagnosis not present

## 2016-08-23 LAB — COMPREHENSIVE METABOLIC PANEL
ALK PHOS: 65 U/L (ref 38–126)
ALT: 15 U/L (ref 14–54)
ANION GAP: 11 (ref 5–15)
AST: 20 U/L (ref 15–41)
Albumin: 3.5 g/dL (ref 3.5–5.0)
BILIRUBIN TOTAL: 0.7 mg/dL (ref 0.3–1.2)
BUN: 11 mg/dL (ref 6–20)
CALCIUM: 9.2 mg/dL (ref 8.9–10.3)
CO2: 27 mmol/L (ref 22–32)
Chloride: 95 mmol/L — ABNORMAL LOW (ref 101–111)
Creatinine, Ser: 0.54 mg/dL (ref 0.44–1.00)
GFR calc Af Amer: >60 mL/min (ref >60)
GLUCOSE: 242 mg/dL — AB (ref 65–99)
Potassium: 4 mmol/L (ref 3.5–5.1)
SODIUM: 133 mmol/L — AB (ref 135–145)
TOTAL PROTEIN: 7.7 g/dL (ref 6.5–8.1)

## 2016-08-23 LAB — CBC WITH DIFFERENTIAL/PLATELET
BASOS ABS: 0 10*3/uL (ref 0.0–0.1)
Basophils Relative: 0 %
EOS ABS: 0 10*3/uL (ref 0.0–0.7)
EOS PCT: 0 %
HCT: 35.6 % — ABNORMAL LOW (ref 36.0–46.0)
Hemoglobin: 11.7 g/dL — ABNORMAL LOW (ref 12.0–15.0)
Lymphocytes Relative: 12 %
Lymphs Abs: 1.1 10*3/uL (ref 0.7–4.0)
MCH: 27.9 pg (ref 26.0–34.0)
MCHC: 32.9 g/dL (ref 30.0–36.0)
MCV: 84.8 fL (ref 78.0–100.0)
MONO ABS: 1.1 10*3/uL — AB (ref 0.1–1.0)
Monocytes Relative: 12 %
NEUTROS ABS: 7.1 10*3/uL (ref 1.7–7.7)
NEUTROS PCT: 76 %
PLATELETS: 265 10*3/uL (ref 150–400)
RBC: 4.2 MIL/uL (ref 3.87–5.11)
RDW: 13 % (ref 11.5–15.5)
WBC: 9.3 10*3/uL (ref 4.0–10.5)

## 2016-08-23 LAB — TROPONIN I

## 2016-08-23 LAB — URINALYSIS, ROUTINE W REFLEX MICROSCOPIC
Bilirubin Urine: NEGATIVE
Glucose, UA: 150 mg/dL — AB
Ketones, ur: 5 mg/dL — AB
Nitrite: POSITIVE — AB
PROTEIN: 30 mg/dL — AB
Specific Gravity, Urine: 1.012 (ref 1.005–1.030)
pH: 8 (ref 5.0–8.0)

## 2016-08-23 LAB — PROTIME-INR
INR: 1.15
PROTHROMBIN TIME: 14.8 s (ref 11.4–15.2)

## 2016-08-23 LAB — LACTIC ACID, PLASMA: Lactic Acid, Venous: 2 mmol/L (ref 0.5–1.9)

## 2016-08-23 LAB — CK: Total CK: 49 U/L (ref 38–234)

## 2016-08-23 LAB — I-STAT CG4 LACTIC ACID, ED: Lactic Acid, Venous: 2.47 mmol/L (ref 0.5–1.9)

## 2016-08-23 MED ORDER — ACETAMINOPHEN 500 MG PO TABS
1000.0000 mg | ORAL_TABLET | Freq: Once | ORAL | Status: AC
  Administered 2016-08-23: 1000 mg via ORAL
  Filled 2016-08-23: qty 2

## 2016-08-23 MED ORDER — SODIUM CHLORIDE 0.9 % IV BOLUS (SEPSIS)
1000.0000 mL | Freq: Once | INTRAVENOUS | Status: AC
  Administered 2016-08-23: 1000 mL via INTRAVENOUS

## 2016-08-23 MED ORDER — PANTOPRAZOLE SODIUM 40 MG PO TBEC
40.0000 mg | DELAYED_RELEASE_TABLET | Freq: Every day | ORAL | Status: DC
  Administered 2016-08-25 – 2016-08-26 (×2): 40 mg via ORAL
  Filled 2016-08-23 (×3): qty 1

## 2016-08-23 MED ORDER — ONDANSETRON HCL 4 MG/2ML IJ SOLN: 4.0000 mg | Freq: Four times a day (QID) | INTRAMUSCULAR | Status: DC | PRN

## 2016-08-23 MED ORDER — DOCUSATE SODIUM 100 MG PO CAPS
100.0000 mg | ORAL_CAPSULE | Freq: Two times a day (BID) | ORAL | Status: DC
  Administered 2016-08-24: 100 mg via ORAL
  Filled 2016-08-23 (×4): qty 1

## 2016-08-23 MED ORDER — DEXTROSE 5 % IV SOLN
1.0000 g | Freq: Once | INTRAVENOUS | Status: AC
  Administered 2016-08-23: 1 g via INTRAVENOUS
  Filled 2016-08-23: qty 10

## 2016-08-23 MED ORDER — ACETAMINOPHEN 650 MG RE SUPP: 650.0000 mg | Freq: Four times a day (QID) | RECTAL | Status: DC | PRN

## 2016-08-23 MED ORDER — DEXTROSE 5 % IV SOLN
1.0000 g | INTRAVENOUS | Status: DC
  Administered 2016-08-24 – 2016-08-25 (×2): 1 g via INTRAVENOUS
  Filled 2016-08-23 (×3): qty 10

## 2016-08-23 MED ORDER — RIVAROXABAN 15 MG PO TABS
15.0000 mg | ORAL_TABLET | Freq: Every day | ORAL | Status: DC
  Administered 2016-08-24 – 2016-08-25 (×2): 15 mg via ORAL
  Filled 2016-08-23 (×4): qty 1

## 2016-08-23 MED ORDER — ATORVASTATIN CALCIUM 20 MG PO TABS
20.0000 mg | ORAL_TABLET | Freq: Every day | ORAL | Status: DC
  Administered 2016-08-24 – 2016-08-25 (×2): 20 mg via ORAL
  Filled 2016-08-23 (×2): qty 1

## 2016-08-23 MED ORDER — SODIUM CHLORIDE 0.9 % IV SOLN
INTRAVENOUS | Status: DC
  Administered 2016-08-23 – 2016-08-25 (×6): via INTRAVENOUS

## 2016-08-23 MED ORDER — GABAPENTIN 300 MG PO CAPS
300.0000 mg | ORAL_CAPSULE | Freq: Three times a day (TID) | ORAL | Status: DC
  Administered 2016-08-24 – 2016-08-26 (×8): 300 mg via ORAL
  Filled 2016-08-23 (×9): qty 1

## 2016-08-23 MED ORDER — ONDANSETRON HCL 4 MG PO TABS: 4.0000 mg | ORAL_TABLET | Freq: Four times a day (QID) | ORAL | Status: DC | PRN

## 2016-08-23 MED ORDER — ACETAMINOPHEN 325 MG PO TABS
650.0000 mg | ORAL_TABLET | Freq: Four times a day (QID) | ORAL | Status: DC | PRN
  Administered 2016-08-24 – 2016-08-25 (×2): 650 mg via ORAL
  Filled 2016-08-23 (×2): qty 2

## 2016-08-23 MED ORDER — CLONIDINE HCL 0.1 MG PO TABS
0.1000 mg | ORAL_TABLET | Freq: Two times a day (BID) | ORAL | Status: DC
  Administered 2016-08-24 – 2016-08-26 (×6): 0.1 mg via ORAL
  Filled 2016-08-23 (×6): qty 1

## 2016-08-23 NOTE — H&P (Addendum)
History and Physical    [name] ZGY:[id number] DOB: 08/09/1935 DOA: 08/23/2016  PCP: Gildardo Cranker, DO Consultants:  Marin Olp - hematology Patient coming from: home - staying with son and daughter-in-law; actually lives in [place].  She had been staying with her daughter but needed more care and so moved here to [facility] since August 2017 (previously moved between both homes).  NOK: son, Dannielle Huh [phone]  Chief Complaint: AMS  HPI: [name] is a 81 y.o. female with medical history significant of dementia; lymphedema of left leg following remote DVT with PE; HTN; HLD; and DM presenting with AMS.  She was complaining of pain in her legs and hips, right hip > left.  More noticeable since Friday.  She had OT on Thursday and was able to participate.  On Friday, she was shaking which was very unusual.  Saturday, she seemed to be a bit better.  Sunday, she was up but having pain and so slower than usual.  Went to church, and she was up late Sunday night - had a very long day.  Monday, she was talking okay.  When she started moving around, she seemed more confused and complained of the pain.  She didn't get up until about 2:30pm Monday (usually out of bed by 8AM).  She had difficulty moving around due to pain.  Usually able to transfer without significant difficulty.  Took maybe an hour to get her up and dressed yesterday.    Today, she hardly ate at all.   No facial droop, but was leaning, kind of out of it.  They called PCP but she was crying out in pain and they thought the ER was a better choice.  Her BP has also been very elevated today - this is very unusual for her, usually <160/90.  Early last week, decreased appetite.  Feet might have been hurting last week.  No known fevers at home.  She usually drinks and pees without difficulty, but wears adult diapers so uncertain UOP.   ED Course: Rocephin for UTI, IVF, concern for sepsis  Review of Systems: Unable to obtain  Ambulatory  Status:  Does not ambulate  Past Medical History:  Diagnosis Date  . Dementia   . Diabetes mellitus type II 03/21/2011  . DVT (deep venous thrombosis) ([place]) 03/21/2011  . Hyperlipidemia 03/21/2011  . Hypertension 03/21/2011  . Lymphedema of leg 03/21/2011  . Pulmonary embolism ([place]) 03/21/2011    Past Surgical History:  Procedure Laterality Date  . CHOLECYSTECTOMY  2015  . SKIN TAG REMOVAL  07/25/2016  . VAGINAL HYSTERECTOMY     unknown of date  . VASCULAR SURGERY      Social History   Social History  . Marital status: Widowed    Spouse name: N/A  . Number of children: N/A  . Years of education: N/A   Occupational History  . Not on file.   Social History Main Topics  . Smoking status: Never Smoker  . Smokeless tobacco: Never Used  . Alcohol use No  . Drug use: No  . Sexual activity: No   Other Topics Concern  . Not on file   Social History Narrative   Diet?       Do you drink/eat things with caffeine? occasional diet soda      Marital status?   Widowed/divorced  What year were you married? [place] you live in a house, apartment, assisted living, condo, trailer, etc.? home      Is it one or more stories? More (mainly living on one floor)      How many persons live in your home?      Do you have any pets in your home? (please list) no      Current or past profession: teacher      Do you exercise?       Physical therapy                              Type & how often? 2-3 per week      Do you have a living will? yes      Do you have a DNR form?      ?                             If not, do you want to discuss one?      Do you have signed POA/HPOA for forms? yes       No Known Allergies  Family History  Problem Relation Age of Onset  . Heart attack Mother   . COPD Sister   . Stroke Sister   . Bipolar disorder Daughter     Prior to Admission medications   Medication Sig Start Date End Date Taking?  Authorizing Provider  acetaminophen (TYLENOL) 500 MG tablet Take 500 mg by mouth every 6 (six) hours as needed.   Yes Historical Provider, MD  atorvastatin (LIPITOR) 20 MG tablet Take 20 mg by mouth daily at 6 PM.    Yes Historical Provider, MD  B Complex Vitamins (VITAMIN B COMPLEX PO) Take 1 tablet by mouth daily.    Yes Historical Provider, MD  cholecalciferol (VITAMIN D) 1000 units tablet Take 5,000 Units by mouth daily.    Yes Historical Provider, MD  cloNIDine (CATAPRES) 0.1 MG tablet Take 0.1 mg by mouth 2 (two) times daily.   Yes Historical Provider, MD  gabapentin (NEURONTIN) 300 MG capsule Take 300 mg by mouth 3 (three) times daily.   Yes Historical Provider, MD  metFORMIN (GLUCOPHAGE) 1000 MG tablet Take 1,000 mg by mouth daily with breakfast.   Yes Historical Provider, MD  metFORMIN (GLUCOPHAGE) 500 MG tablet Take 500 mg by mouth at bedtime.   Yes Historical Provider, MD  Omega 3 1200 MG CAPS Take 1,200 mg by mouth daily.    Yes Historical Provider, MD  OVER THE COUNTER MEDICATION Take 1 capsule by mouth daily. "ultra flore balance"   Yes Historical Provider, MD  Rivaroxaban (XARELTO) 15 MG TABS tablet Take 1 tablet (15 mg total) by mouth daily with supper. 90 day supply 06/01/16  Yes [name], MD  Dexlansoprazole (DEXILANT) 30 MG capsule Take 30 mg by mouth daily.    Historical Provider, MD    Physical Exam: Vitals:   08/23/16 2056 08/23/16 2200 08/23/16 2351 08/24/16 0105  BP: (!) 200/96 (!) 196/93 (!) 194/89 (!) 135/96  Pulse: 95 93 95 98  Resp: 18 (!) 24 (!) 22 20  Temp:  100 F (37.8 C) 98 F (36.7 C) 98 F (36.7 C)  TempSrc:  Rectal Oral Oral  SpO2: 99% 97% 99% 99%  Weight:   56 kg (123 lb 7.3 oz)  Height:   5\' 6"  (1.676 m)      General: Appears sedate, somnolent; stares blankly when asked to answer most questions, or attempts an answer and drifts off in the midst of the answer Eyes:  PERRL, EOMI, normal lids, iris ENT:  grossly normal hearing, lips &  tongue, mmm Neck:  no LAD, masses or thyromegaly Cardiovascular:  RRR, no m/r/g.  Respiratory:  CTA bilaterally, no w/r/r. Normal respiratory effort. Abdomen:  soft, ntnd, NABS Skin:  no rash or induration seen on limited exam Musculoskeletal:  grossly normal tone BUE/BLE, good ROM, no bony abnormality.  Marked left-sided lymphedema; 1-2+ pitting edema on the right. Psychiatric:  Attempts to answer only some questions but rarely completes the response Neurologic:  CN 2-12 grossly intact, moves all extremities in coordinated fashion, sensation intact  Labs on Admission: I have personally reviewed following labs and imaging studies  CBC:  Recent Labs Lab 08/23/16 1708  WBC 9.3  NEUTROABS 7.1  HGB 11.7*  HCT 35.6*  MCV 84.8  PLT 034   Basic Metabolic Panel:  Recent Labs Lab 08/23/16 1708  NA 133*  K 4.0  CL 95*  CO2 27  GLUCOSE 242*  BUN 11  CREATININE 0.54  CALCIUM 9.2   GFR: Estimated Creatinine Clearance: 50.4 mL/min (by C-G formula based on SCr of 0.54 mg/dL). Liver Function Tests:  Recent Labs Lab 08/23/16 1708  AST 20  ALT 15  ALKPHOS 65  BILITOT 0.7  PROT 7.7  ALBUMIN 3.5   No results for input(s): LIPASE, AMYLASE in the last 168 hours. No results for input(s): AMMONIA in the last 168 hours. Coagulation Profile:  Recent Labs Lab 08/23/16 1709  INR 1.15   Cardiac Enzymes:  Recent Labs Lab 08/23/16 1709 08/23/16 1730  CKTOTAL  --  82  TROPONINI <0.03  --    BNP (last 3 results) No results for input(s): PROBNP in the last 8760 hours. HbA1C: No results for input(s): HGBA1C in the last 72 hours. CBG: No results for input(s): GLUCAP in the last 168 hours. Lipid Profile: No results for input(s): CHOL, HDL, LDLCALC, TRIG, CHOLHDL, LDLDIRECT in the last 72 hours. Thyroid Function Tests: No results for input(s): TSH, T4TOTAL, FREET4, T3FREE, THYROIDAB in the last 72 hours. Anemia Panel: No results for input(s): VITAMINB12, FOLATE, FERRITIN,  TIBC, IRON, RETICCTPCT in the last 72 hours. Urine analysis:    Component Value Date/Time   COLORURINE YELLOW 08/23/2016 1949   APPEARANCEUR HAZY (A) 08/23/2016 1949   LABSPEC 1.012 08/23/2016 1949   PHURINE 8.0 08/23/2016 1949   GLUCOSEU 150 (A) 08/23/2016 1949   HGBUR SMALL (A) 08/23/2016 1949   BILIRUBINUR NEGATIVE 08/23/2016 1949   KETONESUR 5 (A) 08/23/2016 1949   PROTEINUR 30 (A) 08/23/2016 1949   NITRITE POSITIVE (A) 08/23/2016 1949   LEUKOCYTESUR TRACE (A) 08/23/2016 1949    Creatinine Clearance: Estimated Creatinine Clearance: 50.4 mL/min (by C-G formula based on SCr of 0.54 mg/dL).  Sepsis Labs: @LABRCNTIP (procalcitonin:4,lacticidven:4) )No results found for this or any previous visit (from the past 240 hour(s)).   Radiological Exams on Admission: Dg Chest 2 View  Result Date: 08/23/2016 CLINICAL DATA:  Worsening mental status over the last 2 days. Dementia. EXAM: CHEST  2 VIEW COMPARISON:  05/11/2016 FINDINGS: Heart size is normal. Chronic aortic atherosclerosis. The lungs are clear. No consolidation or collapse. No effusions. No acute bone finding. IMPRESSION: No active cardiopulmonary disease. Electronically Signed   By: Nelson Chimes M.D.   On: 08/23/2016 15:12  Dg Pelvis 1-2 Views  Result Date: 08/23/2016 CLINICAL DATA:  Combative patient pelvic pain EXAM: PELVIS - 1-2 VIEW COMPARISON:  CT 03/08/2016, radiograph 03/08/2016 FINDINGS: Degenerative changes of the lower lumbar spine and SI joints. No fracture or malalignment. Mild to moderate arthritis of the hips. Pubic symphysis appears intact. Surgical clip in the right pelvis. Calcified pelvic phleboliths. Vascular calcifications. IMPRESSION: No acute osseous abnormality. Mild to moderate arthritis of the hips Electronically Signed   By: Donavan Foil M.D.   On: 08/23/2016 18:37   Ct Head Wo Contrast  Result Date: 08/23/2016 CLINICAL DATA:  Mental status changes EXAM: CT HEAD WITHOUT CONTRAST TECHNIQUE: Contiguous  axial images were obtained from the base of the skull through the vertex without intravenous contrast. COMPARISON:  05/11/2016 FINDINGS: Brain: No intracranial hemorrhage, mass effect or midline shift. Moderate cerebral atrophy again noted. Again noted moderate periventricular and patchy subcortical white matter decreased attenuation consistent with chronic small vessel ischemic changes. No definite acute cortical infarction. No mass lesion is noted on this unenhanced scan. Vascular: Atherosclerotic calcifications of carotid siphon again noted. Skull: No skull fracture. Sinuses/Orbits: No acute findings. Other: None IMPRESSION: No acute intracranial abnormality. Stable atrophy and chronic white matter disease. No definite acute cortical infarction. Electronically Signed   By: [name] Crocker M.D.   On: 08/23/2016 15:32    EKG: Independently reviewed.  NSR with rate 92; nonspecific ST changes with no evidence of acute ischemia  Assessment/Plan Principal Problem:   Sepsis ([place]) Active Problems:   Diabetes mellitus type II, non insulin dependent (HCC)   Hypertension   Dementia   Current use of long term anticoagulation   Acute metabolic encephalopathy   Sepsis -Fever, tachycardia, tachypnea with elevated lactate to 2.47, repeat 2.0 -While awaiting blood cultures, this appears to be a preseptic condition. -Sepsis protocol initiated -UA: few bacteria, small Hgb, 5 ketones, trace LE, +mucous, positive nitrite, 6-30 WBC -Suspect urinary source - abnormal UA and she has otherwise no abnormal PE or lab/Xray findings -Blood and urine cultures pending -Will admit and continue to monitor -Treat with IV Rocephin -Will trend lactate to ensure improvement  Diabetes -Glucose 242 -Hold home Glucophage and cover with SSI  HTN -Poor control thus far -Takes Clonidine monotherapy at home, which is quite unusual -She is not taking an ACE-I, despite h/o DM -prn Hydralazine for now, consider additional  medication(s)  Dementia -Uncertain baseline and how different her current mental status is  -She is not taking Aricept or other memory medication  Anticoagulation -She is taking Xarelto after having been changed from Coumadin by Dr. Marin Olp in January -She takes this for VTE in the past -Dr. Antonieta Pert note suggested that she could decrease from 15 mg daily to 10 mg daily in a few months, so it may be reasonable to do that now  Encephalopathy -Appears to be due to sepsis -Checked CK (daughter-in-law reported use of multiple supplements) and this was negative -Exacerbated by dementia -Will monitor for improvement and resumption to baseline  DVT prophylaxis: Xarelto Code Status: DNR - confirmed with patient/family Family Communication: [name] present throughout evaluation Disposition Plan:  Home once clinically improved Consults called: None  Admission status: Admit - It is my clinical opinion that admission to INPATIENT is reasonable and necessary because this patient will require at least 2 midnights in the hospital to treat this condition based on the medical complexity of the problems presented.  Given the aforementioned information, the predictability of an adverse outcome is felt to be  significant.    Karmen Bongo MD [facility]  If 7PM-7AM, please contact night-coverage [email or web address] Password [email or web address]  08/24/2016, 1:09 AM

## 2016-08-23 NOTE — ED Notes (Signed)
Bed: [facility] Expected date:  Expected time:  Means of arrival:  Comments: EMS leg pain, dementia

## 2016-08-23 NOTE — ED Notes (Signed)
BLOOD CULTURE OBTAINED 5ML LEFT AC

## 2016-08-23 NOTE — Progress Notes (Signed)
Pharmacy Antibiotic Note  [name] is a 81 y.o. female with mental status changes admitted on 08/23/2016 with UTI.  Pharmacy has been consulted for rocephin dosing.  Plan: Rocephin 1 Gm IV q24h  Rx will sign off as no further adjustments necessary  Height: 5\' 6"  (167.6 cm) Weight: 115 lb (52.2 kg) IBW/kg (Calculated) : 59.3  Temp (24hrs), Avg:99.9 F (37.7 C), Min:98.5 F (36.9 C), Max:101.1 F (38.4 C)   Recent Labs Lab 08/23/16 1708 08/23/16 1714 08/23/16 1745  WBC 9.3  --   --   CREATININE 0.54  --   --   LATICACIDVEN  --  2.47* 2.0*    Estimated Creatinine Clearance: 47 mL/min (by C-G formula based on SCr of 0.54 mg/dL).    No Known Allergies  Antimicrobials this admission: 4/17 rocephin >>    >>   Dose adjustments this admission:   Microbiology results:  BCx:   UCx:    Sputum:    MRSA PCR:   Thank you for allowing pharmacy to be a part of this patient's care.  Dorrene German 08/23/2016 11:53 PM

## 2016-08-23 NOTE — ED Notes (Signed)
Family attempted to give patient more applesauce and gingerale from home. Patient is pocketing food and not swallowing completely. Suctioning performed to remove food and drink. Family advised not to give patient any more food or drinks at this time.

## 2016-08-23 NOTE — ED Notes (Signed)
Attempted to draw second set of cultures. X2 was unsuccessful

## 2016-08-23 NOTE — ED Provider Notes (Signed)
[facility] DEPT Provider Note   CSN: [id number] Arrival date & time: 08/23/16  1406     History   Chief Complaint Chief Complaint  Patient presents with  . Altered Mental Status    HPI [name] is a 81 y.o. female.  Pt presents to the ED today with mental status change.  She is from home.  The pt has a hx of dementia and is unable to given any hx.    Pt's family is here now.  Family said that pt has not fallen, but has been complaining of her legs hurting.  Family said that she is normally able to get up and transfer, but has not been able to do so the last few days.  Pt is also getting more confused.        Past Medical History:  Diagnosis Date  . Dementia   . Diabetes mellitus type II 03/21/2011  . DVT (deep venous thrombosis) ([place]) 03/21/2011  . Gallstones   . Hyperlipidemia 03/21/2011  . Hypertension 03/21/2011  . Lymphedema of leg 03/21/2011  . Pulmonary embolism ([place]) 03/21/2011    Patient Active Problem List   Diagnosis Date Noted  . Dementia 08/23/2016  . Acute CVA (cerebrovascular accident) ([place])   . Left-sided weakness 05/11/2016  . Dysarthria 05/11/2016  . Acute left-sided weakness 05/11/2016  . History of pulmonary embolism 03/21/2011  . History of DVT (deep vein thrombosis) 03/21/2011  . Diabetes mellitus type II, non insulin dependent ([place]) 03/21/2011  . Lymphedema of leg 03/21/2011  . Hyperlipidemia 03/21/2011  . Hypertension 03/21/2011    Past Surgical History:  Procedure Laterality Date  . CHOLECYSTECTOMY  2015  . SKIN TAG REMOVAL  07/25/2016  . VAGINAL HYSTERECTOMY     unknown of date  . VASCULAR SURGERY      OB History    Gravida Para Term Preterm AB Living   3 3 3     3    SAB TAB Ectopic Multiple Live Births           3       Home Medications    Prior to Admission medications   Medication Sig Start Date End Date Taking? Authorizing Provider  acetaminophen (TYLENOL) 500 MG tablet Take 500 mg by mouth every 6 (six)  hours as needed.   Yes Historical Provider, MD  atorvastatin (LIPITOR) 20 MG tablet Take 20 mg by mouth daily at 6 PM.    Yes Historical Provider, MD  B Complex Vitamins (VITAMIN B COMPLEX PO) Take 1 tablet by mouth daily.    Yes Historical Provider, MD  cholecalciferol (VITAMIN D) 1000 units tablet Take 5,000 Units by mouth daily.    Yes Historical Provider, MD  cloNIDine (CATAPRES) 0.1 MG tablet Take 0.1 mg by mouth 2 (two) times daily.   Yes Historical Provider, MD  gabapentin (NEURONTIN) 300 MG capsule Take 300 mg by mouth 3 (three) times daily.   Yes Historical Provider, MD  metFORMIN (GLUCOPHAGE) 1000 MG tablet Take 1,000 mg by mouth daily with breakfast.   Yes Historical Provider, MD  metFORMIN (GLUCOPHAGE) 500 MG tablet Take 500 mg by mouth at bedtime.   Yes Historical Provider, MD  Omega 3 1200 MG CAPS Take 1,200 mg by mouth daily.    Yes Historical Provider, MD  OVER THE COUNTER MEDICATION Take 1 capsule by mouth daily. "ultra flore balance"   Yes Historical Provider, MD  Rivaroxaban (XARELTO) 15 MG TABS tablet Take 1 tablet (15 mg total) by mouth daily  with supper. 90 day supply 06/01/16  Yes [name], MD  Dexlansoprazole (DEXILANT) 30 MG capsule Take 30 mg by mouth daily.    Historical Provider, MD    Family History Family History  Problem Relation Age of Onset  . Heart attack Mother   . COPD Sister   . Stroke Sister   . Bipolar disorder Daughter     Social History Social History  Substance Use Topics  . Smoking status: Never Smoker  . Smokeless tobacco: Never Used  . Alcohol use No     Allergies   Patient has no known allergies.   Review of Systems Review of Systems  Unable to perform ROS: Dementia     Physical Exam Updated Vital Signs BP (!) 198/98 (BP Location: Left Arm)   Pulse (!) 101   Temp (!) 101.1 F (38.4 C) (Rectal)   Resp 18   Ht 5\' 6"  (1.676 m)   Wt 115 lb (52.2 kg)   SpO2 99%   BMI 18.56 kg/m   Physical Exam  Constitutional: She  appears well-developed and well-nourished.  HENT:  Head: Normocephalic and atraumatic.  Right Ear: External ear normal.  Left Ear: External ear normal.  Nose: Nose normal.  Mouth/Throat: Oropharynx is clear and moist.  Eyes: Conjunctivae and EOM are normal. Pupils are equal, round, and reactive to light.  Neck: Normal range of motion. Neck supple.  Cardiovascular: Normal rate, regular rhythm, normal heart sounds and intact distal pulses.   Pulmonary/Chest: Effort normal and breath sounds normal.  Abdominal: Soft. Bowel sounds are normal.  Musculoskeletal:  Lymphedema bilateral le (l > r).  This is nl per EMS.  Neurological: She is alert.  Skin: Skin is warm.  Psychiatric: She has a normal mood and affect.  Nursing note and vitals reviewed.    ED Treatments / Results  Labs (all labs ordered are listed, but only abnormal results are displayed) Labs Reviewed  CBC WITH DIFFERENTIAL/PLATELET - Abnormal; Notable for the following:       Result Value   Hemoglobin 11.7 (*)    HCT 35.6 (*)    Monocytes Absolute 1.1 (*)    All other components within normal limits  COMPREHENSIVE METABOLIC PANEL - Abnormal; Notable for the following:    Sodium 133 (*)    Chloride 95 (*)    Glucose, Bld 242 (*)    All other components within normal limits  URINALYSIS, ROUTINE W REFLEX MICROSCOPIC - Abnormal; Notable for the following:    APPearance HAZY (*)    Glucose, UA 150 (*)    Hgb urine dipstick SMALL (*)    Ketones, ur 5 (*)    Protein, ur 30 (*)    Nitrite POSITIVE (*)    Leukocytes, UA TRACE (*)    Bacteria, UA FEW (*)    Squamous Epithelial / LPF 0-5 (*)    All other components within normal limits  LACTIC ACID, PLASMA - Abnormal; Notable for the following:    Lactic Acid, Venous 2.0 (*)    All other components within normal limits  I-STAT CG4 LACTIC ACID, ED - Abnormal; Notable for the following:    Lactic Acid, Venous 2.47 (*)    All other components within normal limits  URINE  CULTURE  CULTURE, BLOOD (ROUTINE X 2)  CULTURE, BLOOD (ROUTINE X 2)  PROTIME-INR  TROPONIN I    EKG  EKG Interpretation  Date/Time:  Tuesday August 23 2016 16:02:35 EDT Ventricular Rate:  92 PR Interval:  QRS Duration: 74 QT Interval:  355 QTC Calculation: 440 R Axis:   61 Text Interpretation:  Sinus rhythm Anteroseptal infarct, age indeterminate Confirmed by [facility] MD, Lurine Imel [id number]) on 08/23/2016 4:04:59 PM       Radiology Dg Chest 2 View  Result Date: 08/23/2016 CLINICAL DATA:  Worsening mental status over the last 2 days. Dementia. EXAM: CHEST  2 VIEW COMPARISON:  05/11/2016 FINDINGS: Heart size is normal. Chronic aortic atherosclerosis. The lungs are clear. No consolidation or collapse. No effusions. No acute bone finding. IMPRESSION: No active cardiopulmonary disease. Electronically Signed   By: Nelson Chimes M.D.   On: 08/23/2016 15:12   Dg Pelvis 1-2 Views  Result Date: 08/23/2016 CLINICAL DATA:  Combative patient pelvic pain EXAM: PELVIS - 1-2 VIEW COMPARISON:  CT 03/08/2016, radiograph 03/08/2016 FINDINGS: Degenerative changes of the lower lumbar spine and SI joints. No fracture or malalignment. Mild to moderate arthritis of the hips. Pubic symphysis appears intact. Surgical clip in the right pelvis. Calcified pelvic phleboliths. Vascular calcifications. IMPRESSION: No acute osseous abnormality. Mild to moderate arthritis of the hips Electronically Signed   By: Donavan Foil M.D.   On: 08/23/2016 18:37   Ct Head Wo Contrast  Result Date: 08/23/2016 CLINICAL DATA:  Mental status changes EXAM: CT HEAD WITHOUT CONTRAST TECHNIQUE: Contiguous axial images were obtained from the base of the skull through the vertex without intravenous contrast. COMPARISON:  05/11/2016 FINDINGS: Brain: No intracranial hemorrhage, mass effect or midline shift. Moderate cerebral atrophy again noted. Again noted moderate periventricular and patchy subcortical white matter decreased attenuation  consistent with chronic small vessel ischemic changes. No definite acute cortical infarction. No mass lesion is noted on this unenhanced scan. Vascular: Atherosclerotic calcifications of carotid siphon again noted. Skull: No skull fracture. Sinuses/Orbits: No acute findings. Other: None IMPRESSION: No acute intracranial abnormality. Stable atrophy and chronic white matter disease. No definite acute cortical infarction. Electronically Signed   By: [name] Crocker M.D.   On: 08/23/2016 15:32    Procedures Procedures (including critical care time)  Medications Ordered in ED Medications  sodium chloride 0.9 % bolus 1,000 mL (0 mLs Intravenous Stopped 08/23/16 1950)    And  0.9 %  sodium chloride infusion ( Intravenous New Bag/Given 08/23/16 1951)  cefTRIAXone (ROCEPHIN) 1 g in dextrose 5 % 50 mL IVPB (not administered)  acetaminophen (TYLENOL) tablet 1,000 mg (1,000 mg Oral Given 08/23/16 2003)     Initial Impression / Assessment and Plan / ED Course  I have reviewed the triage vital signs and the nursing notes.  Pertinent labs & imaging results that were available during my care of the patient were reviewed by me and considered in my medical decision making (see chart for details).    Pt given dose of rocephin for uti.  Pt given IVFs.  Urine sent for culture.  Pt d/w Dr. Lorin Mercy for admission.    Final Clinical Impressions(s) / ED Diagnoses   Final diagnoses:  Lower urinary tract infectious disease  Metabolic encephalopathy  Fever, unspecified fever cause  Ambulatory dysfunction    New Prescriptions New Prescriptions   No medications on file     Isla Pence, MD 08/23/16 2056

## 2016-08-23 NOTE — ED Notes (Signed)
ED Provider at bedside. 

## 2016-08-23 NOTE — ED Notes (Signed)
Bed: WA23 Expected date:  Expected time:  Means of arrival:  Comments: [facility] 

## 2016-08-23 NOTE — ED Notes (Signed)
[place] [phone]

## 2016-08-23 NOTE — ED Notes (Signed)
ATTEMPTED ANOTHER IV. NOT SUCCESSFUL. FAMILY AWARE

## 2016-08-23 NOTE — ED Notes (Addendum)
X-RAY CONFIRMED NO FRACTURE- WILL ATTEMPT IN AND OUT.

## 2016-08-23 NOTE — ED Triage Notes (Signed)
Per EMS, patient from home, family reports increased altered mental status x2 days. Hx dementia. Unable to transport patient to PCP today. Odor noted to urine. Denies any complaints. Edema to bilateral legs that family states is normal. A&Ox3.

## 2016-08-24 DIAGNOSIS — G9341 Metabolic encephalopathy: Secondary | ICD-10-CM | POA: Diagnosis present

## 2016-08-24 DIAGNOSIS — E871 Hypo-osmolality and hyponatremia: Secondary | ICD-10-CM

## 2016-08-24 DIAGNOSIS — Z7901 Long term (current) use of anticoagulants: Secondary | ICD-10-CM

## 2016-08-24 LAB — GLUCOSE, CAPILLARY
Glucose-Capillary: 197 mg/dL — ABNORMAL HIGH (ref 65–99)
Glucose-Capillary: 258 mg/dL — ABNORMAL HIGH (ref 65–99)
Glucose-Capillary: 198 mg/dL — ABNORMAL HIGH (ref 65–99)
Glucose-Capillary: 187 mg/dL — ABNORMAL HIGH (ref 65–99)

## 2016-08-24 LAB — CBC
HCT: 32.9 % — ABNORMAL LOW (ref 36.0–46.0)
Hemoglobin: 10.5 g/dL — ABNORMAL LOW (ref 12.0–15.0)
MCH: 27.3 pg (ref 26.0–34.0)
MCHC: 31.9 g/dL (ref 30.0–36.0)
MCV: 85.7 fL (ref 78.0–100.0)
PLATELETS: 218 10*3/uL (ref 150–400)
RBC: 3.84 MIL/uL — ABNORMAL LOW (ref 3.87–5.11)
RDW: 13.1 % (ref 11.5–15.5)
WBC: 9.6 10*3/uL (ref 4.0–10.5)

## 2016-08-24 LAB — BASIC METABOLIC PANEL
Anion gap: 9 (ref 5–15)
BUN: 9 mg/dL (ref 6–20)
CO2: 24 mmol/L (ref 22–32)
CREATININE: 0.4 mg/dL — AB (ref 0.44–1.00)
Calcium: 8.4 mg/dL — ABNORMAL LOW (ref 8.9–10.3)
Chloride: 99 mmol/L — ABNORMAL LOW (ref 101–111)
GFR calc Af Amer: >60 mL/min (ref >60)
Glucose, Bld: 250 mg/dL — ABNORMAL HIGH (ref 65–99)
Potassium: 3.5 mmol/L (ref 3.5–5.1)
SODIUM: 132 mmol/L — AB (ref 135–145)

## 2016-08-24 LAB — BLOOD CULTURE ID PANEL (REFLEXED)
Acinetobacter baumannii: NOT DETECTED
CANDIDA KRUSEI: NOT DETECTED
CANDIDA TROPICALIS: NOT DETECTED
Candida albicans: NOT DETECTED
Candida glabrata: NOT DETECTED
Candida parapsilosis: NOT DETECTED
ENTEROCOCCUS SPECIES: NOT DETECTED
ESCHERICHIA COLI: NOT DETECTED
Enterobacter cloacae complex: NOT DETECTED
Enterobacteriaceae species: NOT DETECTED
Haemophilus influenzae: NOT DETECTED
KLEBSIELLA PNEUMONIAE: NOT DETECTED
Klebsiella oxytoca: NOT DETECTED
Listeria monocytogenes: NOT DETECTED
Methicillin resistance: DETECTED — AB
NEISSERIA MENINGITIDIS: NOT DETECTED
PROTEUS SPECIES: NOT DETECTED
Pseudomonas aeruginosa: NOT DETECTED
SERRATIA MARCESCENS: NOT DETECTED
STAPHYLOCOCCUS AUREUS BCID: NOT DETECTED
STAPHYLOCOCCUS SPECIES: DETECTED — AB
Streptococcus agalactiae: NOT DETECTED
Streptococcus pneumoniae: NOT DETECTED
Streptococcus pyogenes: NOT DETECTED
Streptococcus species: NOT DETECTED

## 2016-08-24 LAB — LACTIC ACID, PLASMA
LACTIC ACID, VENOUS: 1.1 mmol/L (ref 0.5–1.9)
Lactic Acid, Venous: 1.9 mmol/L (ref 0.5–1.9)

## 2016-08-24 LAB — PROCALCITONIN: Procalcitonin: <0.1 ng/mL

## 2016-08-24 MED ORDER — HYDRALAZINE HCL 20 MG/ML IJ SOLN
5.0000 mg | INTRAMUSCULAR | Status: DC | PRN
  Administered 2016-08-24: 5 mg via INTRAVENOUS
  Filled 2016-08-24: qty 1

## 2016-08-24 MED ORDER — INSULIN ASPART 100 UNIT/ML ~~LOC~~ SOLN
0.0000 [IU] | Freq: Three times a day (TID) | SUBCUTANEOUS | Status: DC
  Administered 2016-08-24: 3 [IU] via SUBCUTANEOUS
  Administered 2016-08-24: 8 [IU] via SUBCUTANEOUS
  Administered 2016-08-24: 3 [IU] via SUBCUTANEOUS
  Administered 2016-08-25: 5 [IU] via SUBCUTANEOUS
  Administered 2016-08-25: 8 [IU] via SUBCUTANEOUS
  Administered 2016-08-25: 3 [IU] via SUBCUTANEOUS
  Administered 2016-08-26: 8 [IU] via SUBCUTANEOUS
  Administered 2016-08-26: 5 [IU] via SUBCUTANEOUS
  Administered 2016-08-26: 3 [IU] via SUBCUTANEOUS

## 2016-08-24 NOTE — Progress Notes (Signed)
[facility] PROGRESS NOTE  [name] NIO:[id number] DOB: 1935-11-16 DOA: 08/23/2016 PCP: Gildardo Cranker, DO  Interim summary and HPI 81 y.o. female with medical history significant of dementia; lymphedema of left leg following remote DVT with PE; HTN; HLD; and DM presenting with AMS. Found to have sepsis due to UTI. Patient denies CP, but is very sleepy and still confused. Continue IV antibiotics and follow cultures.  Assessment/Plan: 1-sepsis due to UTI: -patient elevated lactic acidosis, tachycardia, tachypnea, fever and AMS on admission (meeting sepsis criteria) -will continue current antibiotics -follow culture results -continue IVF's and supportive care  2-type 2 diabetes -will continue SSI and hold oral hypoglycemic agents while inpatient -continue IVF's  3-HTN -elevated -will continue clonidine and PRN hydralazine -will add low dose lisinopril or ARB for better control base on fluctuation   4-Dementia -no hx of  Behavioral disturbances -will continue supportive therapy -infection causing worsening mentation due to encephalopathy   5-acute toxic encephalopathy -due to infection -will continue antibiotics -follow mentation  -will check TSH, B12 and RPR  6-chronic anticoagulation: due to prior hx of DVT/PE -will continue xarelto  7-hyponatremia -Due to poor PO intake and dehydration  -will continue IVF's -follow electrolytes trend   Code Status: DNR Family Communication: significant other at bedside Disposition Plan: to be determined; but anticipate discharge back home with [facility] services.   Consultants:  None   Procedures:  See below for x-ray reports   Antibiotics:  Rocephin   HPI/Subjective: Patient afebrile currently, no CP or SOB. Denies nausea or vomiting. Per significant other at baseline, patient less interactive and even improved mentation, still not at baseline. Pre record review, positive low grade temp  overnight.  Objective: Vitals:   08/24/16 1500 08/24/16 2027  BP: 139/77 (!) 158/81  Pulse: 80 97  Resp: 18 16  Temp: 99.3 F (37.4 C) 99.3 F (37.4 C)    Intake/Output Summary (Last 24 hours) at 08/24/16 2307 Last data filed at 08/24/16 1800  Gross per 24 hour  Intake          3770.75 ml  Output              400 ml  Net          3370.75 ml   Filed Weights   08/23/16 1634 08/23/16 2351  Weight: 52.2 kg (115 lb) 56 kg (123 lb 7.3 oz)    Exam:   General:  Afebrile currently, oriented to person only. Patient sleepy and following sleep during examination. No CP or SOB.  Cardiovascular: S1 and S2, no rubs, no gallops  Respiratory: good air movement, no crackles  Abdomen: soft, NT, ND, positive BS  Musculoskeletal: 2++ edema LLE, trace to 1 plus RLE; no cyanosis.  Data Reviewed: Basic Metabolic Panel:  Recent Labs Lab 08/23/16 1708 08/24/16 0255  NA 133* 132*  K 4.0 3.5  CL 95* 99*  CO2 27 24  GLUCOSE 242* 250*  BUN 11 9  CREATININE 0.54 0.40*  CALCIUM 9.2 8.4*   Liver Function Tests:  Recent Labs Lab 08/23/16 1708  AST 20  ALT 15  ALKPHOS 65  BILITOT 0.7  PROT 7.7  ALBUMIN 3.5   CBC:  Recent Labs Lab 08/23/16 1708 08/24/16 0255  WBC 9.3 9.6  NEUTROABS 7.1  --   HGB 11.7* 10.5*  HCT 35.6* 32.9*  MCV 84.8 85.7  PLT 265 218   Cardiac Enzymes:  Recent Labs Lab 08/23/16 1709 08/23/16 1730  CKTOTAL  --  49  TROPONINI <  0.03  --    CBG:  Recent Labs Lab 08/24/16 0852 08/24/16 1217 08/24/16 1748 08/24/16 2039  GLUCAP 197* 258* 198* 187*    Recent Results (from the past 240 hour(s))  Blood culture (routine x 2)     Status: None (Preliminary result)   Collection Time: 08/23/16  5:10 PM  Result Value Ref Range Status   Specimen Description BLOOD LEFT ANTECUBITAL  Final   Special Requests   Final    BOTTLES DRAWN AEROBIC AND ANAEROBIC Blood Culture adequate volume   Culture   Final    NO GROWTH < 24 HOURS Performed at [facility], [place] [address] [place], [place] [address]    Report Status PENDING  Incomplete  Blood culture (routine x 2)     Status: None (Preliminary result)   Collection Time: 08/23/16  5:41 PM  Result Value Ref Range Status   Specimen Description BLOOD BLOOD RIGHT FOREARM  Final   Special Requests   Final    BOTTLES DRAWN AEROBIC AND ANAEROBIC Blood Culture adequate volume   Culture  Setup Time   Final    GRAM POSITIVE COCCI IN CLUSTERS AEROBIC BOTTLE ONLY Organism ID to follow CRITICAL RESULT CALLED TO, READ BACK BY AND VERIFIED WITH: E GREEN PHARMD [id number] 08/24/16 A BROWNING    Culture   Final    NO GROWTH < 24 HOURS Performed at [facility], [place] [address] [place],  [address]    Report Status PENDING  Incomplete  Blood Culture ID Panel (Reflexed)     Status: Abnormal   Collection Time: 08/23/16  5:41 PM  Result Value Ref Range Status   Enterococcus species NOT DETECTED NOT DETECTED Final   Listeria monocytogenes NOT DETECTED NOT DETECTED Final   Staphylococcus species DETECTED (A) NOT DETECTED Final    Comment: Methicillin (oxacillin) resistant coagulase negative staphylococcus. Possible blood culture contaminant (unless isolated from more than one blood culture draw or clinical case suggests pathogenicity). No antibiotic treatment is indicated for blood  culture contaminants. CRITICAL RESULT CALLED TO, READ BACK BY AND VERIFIED WITH: E GREEN PHARMD 2237 08/24/16 A BROWNING    Staphylococcus aureus NOT DETECTED NOT DETECTED Final   Methicillin resistance DETECTED (A) NOT DETECTED Final    Comment: CRITICAL RESULT CALLED TO, READ BACK BY AND VERIFIED WITH: E GREEN PHARMD 2237 08/24/16 A BROWNING    Streptococcus species NOT DETECTED NOT DETECTED Final   Streptococcus agalactiae NOT DETECTED NOT DETECTED Final   Streptococcus pneumoniae NOT DETECTED NOT DETECTED Final   Streptococcus pyogenes NOT DETECTED NOT DETECTED Final   Acinetobacter baumannii NOT  DETECTED NOT DETECTED Final   Enterobacteriaceae species NOT DETECTED NOT DETECTED Final   Enterobacter cloacae complex NOT DETECTED NOT DETECTED Final   Escherichia coli NOT DETECTED NOT DETECTED Final   Klebsiella oxytoca NOT DETECTED NOT DETECTED Final   Klebsiella pneumoniae NOT DETECTED NOT DETECTED Final   Proteus species NOT DETECTED NOT DETECTED Final   Serratia marcescens NOT DETECTED NOT DETECTED Final   Haemophilus influenzae NOT DETECTED NOT DETECTED Final   Neisseria meningitidis NOT DETECTED NOT DETECTED Final   Pseudomonas aeruginosa NOT DETECTED NOT DETECTED Final   Candida albicans NOT DETECTED NOT DETECTED Final   Candida glabrata NOT DETECTED NOT DETECTED Final   Candida krusei NOT DETECTED NOT DETECTED Final   Candida parapsilosis NOT DETECTED NOT DETECTED Final   Candida tropicalis NOT DETECTED NOT DETECTED Final    Comment: Performed at [facility], [place] [address] [place], [place] [address]     Studies: Dg Chest 2 View  Result Date: 08/23/2016 CLINICAL DATA:  Worsening mental status over the last 2 days. Dementia. EXAM: CHEST  2 VIEW COMPARISON:  05/11/2016 FINDINGS: Heart size is normal. Chronic aortic atherosclerosis. The lungs are clear. No consolidation or collapse. No effusions. No acute bone finding. IMPRESSION: No active cardiopulmonary disease. Electronically Signed   By: Nelson Chimes M.D.   On: 08/23/2016 15:12   Dg Pelvis 1-2 Views  Result Date: 08/23/2016 CLINICAL DATA:  Combative patient pelvic pain EXAM: PELVIS - 1-2 VIEW COMPARISON:  CT 03/08/2016, radiograph 03/08/2016 FINDINGS: Degenerative changes of the lower lumbar spine and SI joints. No fracture or malalignment. Mild to moderate arthritis of the hips. Pubic symphysis appears intact. Surgical clip in the right pelvis. Calcified pelvic phleboliths. Vascular calcifications. IMPRESSION: No acute osseous abnormality. Mild to moderate arthritis of the hips Electronically Signed   By: Donavan Foil M.D.   On: 08/23/2016 18:37   Ct Head Wo Contrast  Result Date: 08/23/2016 CLINICAL DATA:  Mental status changes EXAM: CT HEAD WITHOUT CONTRAST TECHNIQUE: Contiguous axial images were obtained from the base of the skull through the vertex without intravenous contrast. COMPARISON:  05/11/2016 FINDINGS: Brain: No intracranial hemorrhage, mass effect or midline shift. Moderate cerebral atrophy again noted. Again noted moderate periventricular and patchy subcortical white matter decreased attenuation consistent with chronic small vessel ischemic changes. No definite acute cortical infarction. No mass lesion is noted on this unenhanced scan. Vascular: Atherosclerotic calcifications of carotid siphon again noted. Skull: No skull fracture. Sinuses/Orbits: No acute findings. Other: None IMPRESSION: No acute intracranial abnormality. Stable atrophy and chronic white matter disease. No definite acute cortical infarction. Electronically Signed   By: [name] Crocker M.D.   On: 08/23/2016 15:32    Scheduled Meds: . atorvastatin  20 mg Oral q1800  . cloNIDine  0.1 mg Oral BID  . docusate sodium  100 mg Oral BID  . gabapentin  300 mg Oral TID  . insulin aspart  0-15 Units Subcutaneous TID WC  . pantoprazole  40 mg Oral Daily  . Rivaroxaban  15 mg Oral Q supper   Continuous Infusions: . sodium chloride 100 mL/hr at 08/24/16 1352  . cefTRIAXone (ROCEPHIN)  IV Stopped (08/24/16 2105)    Principal Problem:   Sepsis ([place]) Active Problems:   Diabetes mellitus type II, non insulin dependent ([place])   Hypertension   Dementia   Current use of long term anticoagulation   Acute metabolic encephalopathy    Time spent: 30 minutes    Barton Dubois  [facility] Pager [phone]. If 7PM-7AM, please contact night-coverage at [email or web address], password [facility] 08/24/2016, 11:07 PM  LOS: 1 day

## 2016-08-24 NOTE — Progress Notes (Signed)
[id number]- Patient Systolic BP is 709, before transfer from ED Systolic was 643. No PRNs ordered. May you assist? Thanks    MD Lorin Mercy paged

## 2016-08-24 NOTE — Progress Notes (Signed)
Per [facility] rep, pt is active with [facility] for HHPT/OT and RN. Will need resumption orders at discharge if continues to need home health services. PT evaluation ordered and CM will continue to follow. Marney Doctor RN,BSN,NCM [phone]

## 2016-08-24 NOTE — Progress Notes (Signed)
PHARMACY - PHYSICIAN COMMUNICATION CRITICAL VALUE ALERT - BLOOD CULTURE IDENTIFICATION (BCID)  Results for orders placed or performed during the hospital encounter of 08/23/16  Blood Culture ID Panel (Reflexed) (Collected: 08/23/2016  5:41 PM)  Result Value Ref Range   Enterococcus species NOT DETECTED NOT DETECTED   Listeria monocytogenes NOT DETECTED NOT DETECTED   Staphylococcus species DETECTED (A) NOT DETECTED   Staphylococcus aureus NOT DETECTED NOT DETECTED   Methicillin resistance DETECTED (A) NOT DETECTED   Streptococcus species NOT DETECTED NOT DETECTED   Streptococcus agalactiae NOT DETECTED NOT DETECTED   Streptococcus pneumoniae NOT DETECTED NOT DETECTED   Streptococcus pyogenes NOT DETECTED NOT DETECTED   Acinetobacter baumannii NOT DETECTED NOT DETECTED   Enterobacteriaceae species NOT DETECTED NOT DETECTED   Enterobacter cloacae complex NOT DETECTED NOT DETECTED   Escherichia coli NOT DETECTED NOT DETECTED   Klebsiella oxytoca NOT DETECTED NOT DETECTED   Klebsiella pneumoniae NOT DETECTED NOT DETECTED   Proteus species NOT DETECTED NOT DETECTED   Serratia marcescens NOT DETECTED NOT DETECTED   Haemophilus influenzae NOT DETECTED NOT DETECTED   Neisseria meningitidis NOT DETECTED NOT DETECTED   Pseudomonas aeruginosa NOT DETECTED NOT DETECTED   Candida albicans NOT DETECTED NOT DETECTED   Candida glabrata NOT DETECTED NOT DETECTED   Candida krusei NOT DETECTED NOT DETECTED   Candida parapsilosis NOT DETECTED NOT DETECTED   Candida tropicalis NOT DETECTED NOT DETECTED    Name of physician (or Provider) Contacted: Tylene Fantasia  Changes to prescribed antibiotics required: WBCs ok, afebrile, only in x1 bottle, probably contaminant.    Dorrene German 08/24/2016  10:48 PM

## 2016-08-25 DIAGNOSIS — A4151 Sepsis due to Escherichia coli [E. coli]: Principal | ICD-10-CM

## 2016-08-25 LAB — BASIC METABOLIC PANEL
Anion gap: 8 (ref 5–15)
BUN: 9 mg/dL (ref 6–20)
CALCIUM: 8.2 mg/dL — AB (ref 8.9–10.3)
CO2: 26 mmol/L (ref 22–32)
CREATININE: 0.44 mg/dL (ref 0.44–1.00)
Chloride: 102 mmol/L (ref 101–111)
GFR calc non Af Amer: >60 mL/min (ref >60)
Glucose, Bld: 214 mg/dL — ABNORMAL HIGH (ref 65–99)
Potassium: 3.5 mmol/L (ref 3.5–5.1)
SODIUM: 136 mmol/L (ref 135–145)

## 2016-08-25 LAB — GLUCOSE, CAPILLARY
GLUCOSE-CAPILLARY: 177 mg/dL — AB (ref 65–99)
GLUCOSE-CAPILLARY: 213 mg/dL — AB (ref 65–99)
GLUCOSE-CAPILLARY: 248 mg/dL — AB (ref 65–99)
Glucose-Capillary: 258 mg/dL — ABNORMAL HIGH (ref 65–99)

## 2016-08-25 LAB — VITAMIN B12: VITAMIN B 12: 344 pg/mL (ref 180–914)

## 2016-08-25 LAB — TSH: TSH: 1.986 u[IU]/mL (ref 0.350–4.500)

## 2016-08-25 LAB — CBC
HCT: 29.2 % — ABNORMAL LOW (ref 36.0–46.0)
Hemoglobin: 9.3 g/dL — ABNORMAL LOW (ref 12.0–15.0)
MCH: 26.9 pg (ref 26.0–34.0)
MCHC: 31.8 g/dL (ref 30.0–36.0)
MCV: 84.4 fL (ref 78.0–100.0)
PLATELETS: 229 10*3/uL (ref 150–400)
RBC: 3.46 MIL/uL — AB (ref 3.87–5.11)
RDW: 12.9 % (ref 11.5–15.5)
WBC: 6.8 10*3/uL (ref 4.0–10.5)

## 2016-08-25 LAB — RPR: RPR: NONREACTIVE

## 2016-08-25 MED ORDER — LOSARTAN POTASSIUM 50 MG PO TABS
25.0000 mg | ORAL_TABLET | Freq: Every day | ORAL | Status: DC
  Administered 2016-08-25: 25 mg via ORAL
  Administered 2016-08-26: 11:00:00 via ORAL
  Filled 2016-08-25 (×2): qty 1

## 2016-08-25 NOTE — Evaluation (Signed)
Physical Therapy Evaluation Patient Details Name: [name] MRN: [id number] DOB: 1936-05-06 Today's Date: 08/25/2016   History of Present Illness  81 y.o.femalewith medical history significant of dementia; lymphedema of left leg following remote DVT with PE; HTN; HLD; and DM presenting with AMS.  Clinical Impression  The patient  Was assisted with total assist to sitting on bed edge, then returned to supine. Complained of pain of the  Left leg with any movement. No family present to discuss DC plans. Pt admitted with above diagnosis. Pt currently with functional limitations due to the deficits listed below (see PT Problem List).  Pt will benefit from skilled PT to increase their independence and safety with mobility to allow discharge to the venue listed below.       Follow Up Recommendations Home health PT;SNF;Supervision/Assistance - 24 hour (no family to discuss DC plan)    Equipment Recommendations  None recommended by PT    Recommendations for Other Services       Precautions / Restrictions Precautions Precautions: Fall Precaution Comments: painful LLE      Mobility  Bed Mobility Overal bed mobility: Needs Assistance Bed Mobility: Sit to Supine;Supine to Sit     Supine to sit: Total assist;+2 for physical assistance;+2 for safety/equipment Sit to supine: Total assist;+2 for physical assistance;+2 for safety/equipment   General bed mobility comments: assist in all aspects to mobilize  Transfers                 General transfer comment: NT  Ambulation/Gait                Stairs            Wheelchair Mobility    Modified Rankin (Stroke Patients Only)       Balance Overall balance assessment: Needs assistance Sitting-balance support: Feet supported;Bilateral upper extremity supported Sitting balance-Leahy Scale: Poor Sitting balance - Comments: initally pushing back, at times could hold  self in midline, then drift backward,                                      Pertinent Vitals/Pain Pain Assessment: Faces Faces Pain Scale: Hurts whole lot Pain Location: left leg when moved, flexion of knee painful initially, improved with repetition Pain Descriptors / Indicators: Discomfort;Grimacing;Guarding;Moaning Pain Intervention(s): Monitored during session;Patient requesting pain meds-RN notified    Home Living Family/patient expects to be discharged to:: Private residence Living Arrangements: Children Available Help at Discharge: Family;Personal care attendant;Available 24 hours/day Type of Home: House       Home Layout: One level Home Equipment: Wheelchair - [employer] - 4 wheels      Prior Function Level of Independence: Needs assistance   Gait / Transfers Assistance Needed: non ambualtory- HHPT following           Hand Dominance        Extremity/Trunk Assessment   Upper Extremity Assessment Upper Extremity Assessment: Generalized weakness    Lower Extremity Assessment Lower Extremity Assessment: LLE deficits/detail LLE Deficits / Details: noted edema, painful to move, minimal active dorsiflexion       Communication      Cognition Arousal/Alertness: Awake/alert Behavior During Therapy: WFL for tasks assessed/performed Overall Cognitive Status: No family/caregiver present to determine baseline cognitive functioning Area of Impairment: Orientation  General Comments: oriented to hospital      General Comments      Exercises     Assessment/Plan    PT Assessment Patient needs continued PT services  PT Problem List Decreased strength;Decreased range of motion;Decreased activity tolerance;Decreased balance;Decreased mobility;Decreased knowledge of precautions;Decreased safety awareness;Decreased knowledge of use of DME;Pain       PT Treatment Interventions Functional mobility training;Therapeutic activities;Therapeutic  exercise;Patient/family education    PT Goals (Current goals can be found in the Care Plan section)  Acute Rehab PT Goals Patient Stated Goal: none stated PT Goal Formulation: Patient unable to participate in goal setting Time For Goal Achievement: 09/08/16 Potential to Achieve Goals: Fair    Frequency Min 3X/week   Barriers to discharge        Co-evaluation               End of Session   Activity Tolerance: Patient tolerated treatment well Patient left: in bed;with call bell/phone within reach;with bed alarm set Nurse Communication: Mobility status PT Visit Diagnosis: Muscle weakness (generalized) (M62.81)    Time: 6349-4944 PT Time Calculation (min) (ACUTE ONLY): 26 min   Charges:   PT Evaluation $PT Eval Low Complexity: 1 Procedure PT Treatments $Therapeutic Activity: 8-22 mins   PT G CodesTresa Endo PT [phone]   Claretha Cooper 08/25/2016, 4:20 PM

## 2016-08-25 NOTE — Progress Notes (Signed)
[facility] PROGRESS NOTE  [name] UYQ:[id number] DOB: 01-Jun-1935 DOA: 08/23/2016 PCP: Gildardo Cranker, DO  Interim summary and HPI 81 y.o. female with medical history significant of dementia; lymphedema of left leg following remote DVT with PE; HTN; HLD; and DM presenting with AMS. Found to have sepsis due to UTI. Patient denies CP, but is very sleepy and still confused. Continue IV antibiotics and follow cultures.  Assessment/Plan: 1-sepsis due to E. Coli UTI: -patient elevated lactic acidosis, tachycardia, tachypnea, fever and AMS on admission (meeting sepsis criteria) -will continue rocephin and follow sensitivity  -continue supportive care and follow clinical response   2-type 2 diabetes -will continue SSI while inpatient -continue holding oral hypoglycemic agents -follow CBG's and adjust therapy as needed   3-HTN -BP improved, but still elevated (SBP 150-160) -will continue home clonidine and continue PRN hydralazine -losartan added for better BP control  4-Dementia -no hx of  Behavioral disturbances reported by family and none appreciated during hospitalization  -will continue supportive therapy -continue antibiotic therapy for superimposed toxic encephalopathy    5-acute toxic encephalopathy -due to UTI infection -will continue antibiotics and follow sensitivity  -checked TSH and B12 (WNL); RPR no reactive -continue supportive care and follow clinical response   6-chronic anticoagulation: due to prior hx of DVT/PE -will continue xarelto -no signs of acute bleeding   7-hyponatremia -Due to poor PO intake and dehydration  -improved with IVF's; will start adjusting rate to prevent risk of fluid overload. -Na 136 now -will follow up trend   8-positive 1/2 MRSA on blood cultures -most likely contaminant  -will repeat blood cx's today -continue current antibiotics for now  Code Status: DNR Family Communication: significant other at bedside Disposition  Plan: to be determined; but anticipate discharge back home with [facility] services once medically stable..   Consultants:  None   Procedures:  See below for x-ray reports   Antibiotics:  Rocephin   HPI/Subjective: Patient is afebrile, no CP, no SOB, no nausea or vomiting. Eating and drinking breakfast w/o difficulties. Patient is more alert/oriented and is following commands. 1/2 blood cx's positive for MRSA  Objective: Vitals:   08/25/16 0440 08/25/16 1500  BP: (!) 157/80 (!) 160/79  Pulse: 89 89  Resp: 16 18  Temp: 98.5 F (36.9 C) 99.2 F (37.3 C)    Intake/Output Summary (Last 24 hours) at 08/25/16 1833 Last data filed at 08/25/16 1400  Gross per 24 hour  Intake             1870 ml  Output                0 ml  Net             1870 ml   Filed Weights   08/23/16 1634 08/23/16 2351  Weight: 52.2 kg (115 lb) 56 kg (123 lb 7.3 oz)    Exam:   General:  Patient is afebrile, AAOX2; pleasantly confused and with vague insight. Patient's significant other at bedside expressed that patient is mentally improved and close to baseline.   Cardiovascular: regular rate, no rubs, no gallops, no JVD  Respiratory: CTA bilaterally; good air movement   Abdomen: soft, NT, ND, positive BS, no guarding   Musculoskeletal: patient with 2++ edema LLE, trace to 1 plus RLE; no cyanosis or clubbing.  Data Reviewed: Basic Metabolic Panel:  Recent Labs Lab 08/23/16 1708 08/24/16 0255 08/25/16 0403  NA 133* 132* 136  K 4.0 3.5 3.5  CL 95* 99* 102  CO2 27 24 26   GLUCOSE 242* 250* 214*  BUN 11 9 9   CREATININE 0.54 0.40* 0.44  CALCIUM 9.2 8.4* 8.2*   Liver Function Tests:  Recent Labs Lab 08/23/16 1708  AST 20  ALT 15  ALKPHOS 65  BILITOT 0.7  PROT 7.7  ALBUMIN 3.5   CBC:  Recent Labs Lab 08/23/16 1708 08/24/16 0255 08/25/16 0403  WBC 9.3 9.6 6.8  NEUTROABS 7.1  --   --   HGB 11.7* 10.5* 9.3*  HCT 35.6* 32.9* 29.2*  MCV 84.8 85.7 84.4  PLT 265 218 229   Cardiac  Enzymes:  Recent Labs Lab 08/23/16 1709 08/23/16 1730  CKTOTAL  --  49  TROPONINI <0.03  --    CBG:  Recent Labs Lab 08/24/16 1748 08/24/16 2039 08/25/16 0827 08/25/16 1203 08/25/16 1715  GLUCAP 198* 187* 177* 258* 213*    Recent Results (from the past 240 hour(s))  Blood culture (routine x 2)     Status: None (Preliminary result)   Collection Time: 08/23/16  5:10 PM  Result Value Ref Range Status   Specimen Description BLOOD LEFT ANTECUBITAL  Final   Special Requests   Final    BOTTLES DRAWN AEROBIC AND ANAEROBIC Blood Culture adequate volume   Culture   Final    NO GROWTH 2 DAYS Performed at [facility], [place] [address] [place], [place] [address]    Report Status PENDING  Incomplete  Blood culture (routine x 2)     Status: Abnormal (Preliminary result)   Collection Time: 08/23/16  5:41 PM  Result Value Ref Range Status   Specimen Description BLOOD BLOOD RIGHT FOREARM  Final   Special Requests   Final    BOTTLES DRAWN AEROBIC AND ANAEROBIC Blood Culture adequate volume   Culture  Setup Time   Final    GRAM POSITIVE COCCI IN CLUSTERS AEROBIC BOTTLE ONLY CRITICAL RESULT CALLED TO, READ BACK BY AND VERIFIED WITH: E GREEN PHARMD 2237 08/24/16 A BROWNING Performed at [facility], [place] [address] [place], [place] [address]    Culture STAPHYLOCOCCUS EPIDERMIDIS (A)  Final   Report Status PENDING  Incomplete  Blood Culture ID Panel (Reflexed)     Status: Abnormal   Collection Time: 08/23/16  5:41 PM  Result Value Ref Range Status   Enterococcus species NOT DETECTED NOT DETECTED Final   Listeria monocytogenes NOT DETECTED NOT DETECTED Final   Staphylococcus species DETECTED (A) NOT DETECTED Final    Comment: Methicillin (oxacillin) resistant coagulase negative staphylococcus. Possible blood culture contaminant (unless isolated from more than one blood culture draw or clinical case suggests pathogenicity). No antibiotic treatment is indicated for blood   culture contaminants. CRITICAL RESULT CALLED TO, READ BACK BY AND VERIFIED WITH: E GREEN PHARMD 2237 08/24/16 A BROWNING    Staphylococcus aureus NOT DETECTED NOT DETECTED Final   Methicillin resistance DETECTED (A) NOT DETECTED Final    Comment: CRITICAL RESULT CALLED TO, READ BACK BY AND VERIFIED WITH: E GREEN PHARMD 2237 08/24/16 A BROWNING    Streptococcus species NOT DETECTED NOT DETECTED Final   Streptococcus agalactiae NOT DETECTED NOT DETECTED Final   Streptococcus pneumoniae NOT DETECTED NOT DETECTED Final   Streptococcus pyogenes NOT DETECTED NOT DETECTED Final   Acinetobacter baumannii NOT DETECTED NOT DETECTED Final   Enterobacteriaceae species NOT DETECTED NOT DETECTED Final   Enterobacter cloacae complex NOT DETECTED NOT DETECTED Final   Escherichia coli NOT DETECTED NOT DETECTED Final   Klebsiella oxytoca NOT  DETECTED NOT DETECTED Final   Klebsiella pneumoniae NOT DETECTED NOT DETECTED Final   Proteus species NOT DETECTED NOT DETECTED Final   Serratia marcescens NOT DETECTED NOT DETECTED Final   Haemophilus influenzae NOT DETECTED NOT DETECTED Final   Neisseria meningitidis NOT DETECTED NOT DETECTED Final   Pseudomonas aeruginosa NOT DETECTED NOT DETECTED Final   Candida albicans NOT DETECTED NOT DETECTED Final   Candida glabrata NOT DETECTED NOT DETECTED Final   Candida krusei NOT DETECTED NOT DETECTED Final   Candida parapsilosis NOT DETECTED NOT DETECTED Final   Candida tropicalis NOT DETECTED NOT DETECTED Final    Comment: Performed at [facility], [place] [address] [place], [place] [address]  Urine culture     Status: Abnormal (Preliminary result)   Collection Time: 08/23/16  7:49 PM  Result Value Ref Range Status   Specimen Description URINE, RANDOM  Final   Special Requests NONE  Final   Culture >=100,000 COLONIES/mL ESCHERICHIA COLI (A)  Final   Report Status PENDING  Incomplete     Studies: No results found.  Scheduled Meds: . atorvastatin  20  mg Oral q1800  . cloNIDine  0.1 mg Oral BID  . docusate sodium  100 mg Oral BID  . gabapentin  300 mg Oral TID  . insulin aspart  0-15 Units Subcutaneous TID WC  . pantoprazole  40 mg Oral Daily  . Rivaroxaban  15 mg Oral Q supper   Continuous Infusions: . sodium chloride 100 mL/hr at 08/25/16 1024  . cefTRIAXone (ROCEPHIN)  IV Stopped (08/24/16 2105)    Principal Problem:   Sepsis ([place]) Active Problems:   Diabetes mellitus type II, non insulin dependent ([place])   Hypertension   Dementia   Current use of long term anticoagulation   Acute metabolic encephalopathy    Time spent: 25 minutes    Barton Dubois  [facility] Pager [phone]. If 7PM-7AM, please contact night-coverage at [email or web address], password [facility] 08/25/2016, 6:33 PM  LOS: 2 days

## 2016-08-26 DIAGNOSIS — N390 Urinary tract infection, site not specified: Secondary | ICD-10-CM

## 2016-08-26 DIAGNOSIS — B952 Enterococcus as the cause of diseases classified elsewhere: Secondary | ICD-10-CM

## 2016-08-26 LAB — URINE CULTURE: Culture: >=100000 — AB

## 2016-08-26 LAB — CULTURE, BLOOD (ROUTINE X 2): SPECIAL REQUESTS: ADEQUATE

## 2016-08-26 LAB — GLUCOSE, CAPILLARY
GLUCOSE-CAPILLARY: 209 mg/dL — AB (ref 65–99)
Glucose-Capillary: 264 mg/dL — ABNORMAL HIGH (ref 65–99)
Glucose-Capillary: 184 mg/dL — ABNORMAL HIGH (ref 65–99)

## 2016-08-26 MED ORDER — LOSARTAN POTASSIUM 25 MG PO TABS: 25.0000 mg | ORAL_TABLET | Freq: Every day | ORAL | 1 refills | Status: DC

## 2016-08-26 MED ORDER — CEFUROXIME AXETIL 500 MG PO TABS: 500.0000 mg | ORAL_TABLET | Freq: Two times a day (BID) | ORAL | 0 refills | Status: AC

## 2016-08-26 NOTE — Progress Notes (Signed)
Pt has been in to see [name]

## 2016-08-26 NOTE — Progress Notes (Signed)
PHYSICAL THERAPY NOTE.SPOKE TO DAUGHTER -IN- LAW BY PHONE, WHO IS PRIMARY CAREGIVER AND DECLINES NEED FOR SNF. THIS FAMILY EXPRESSES CONCERN FOR PAINFUL LEFT LEG THAT THE PATIENT HAS. DEFERRED FAMILY TO DISCUSS FURTHER WITH PATIENT'S HOSPITALIST. PT RECOMMENDS HHPT AND  POSSIBLE MECHANICAL LIFT FOR HOME. CONTINUE PT WHILE IN ACUTE CARE.  1 VISIT, 1 HOME MANAGEMENT-8-22 MINUTES.Tresa Endo PT [phone]

## 2016-08-26 NOTE — Progress Notes (Signed)
This CM went over Physical Therapy recommendations with daughter [name] via phone. [name] would like for pt to return home with home health services. [name] states that pt has caregivers 24hrs/day.  Will need MD orders for HHPT/OT/RN at discharge. [name] also request that Physical Therapy see pt today while she is there after 3:30pm. This CM passed request on to staff RN who will call Physical Therapy to schedule. No other CM needs communicated. Marney Doctor RN,BSN,NCM [phone]

## 2016-08-26 NOTE — Discharge Summary (Signed)
Physician Discharge Summary  [name] EHO:[id number] DOB: December 11, 1935 DOA: 08/23/2016  PCP: Gildardo Cranker, DO  Admit date: 08/23/2016 Discharge date: 08/26/2016  Time spent: 35 minutes  Recommendations for Outpatient Follow-up:  1. Repeat BMET to follow electrolytes and renal function  2. Repeat CBC to follow Hgb 3. Reassess BP and adjust medications as needed    Discharge Diagnoses:  Sepsis ([place]) due to E. Coli UTI Diabetes mellitus type II, non insulin dependent ([place]) Hypertension Dementia Current use of long term anticoagulation Metabolic encephalopathy Lower E. Coli urinary tract infectious disease Prior hx of DVT/PE   Discharge Condition: stable and improved. Discharge home with [facility] services and instructions to follow up with PCP in 10 days.  Diet recommendation: heart healthy diet and modified carbohydrates   Filed Weights   08/23/16 1634 08/23/16 2351  Weight: 52.2 kg (115 lb) 56 kg (123 lb 7.3 oz)    History of present illness:  81 y.o.femalewith medical history significant of dementia; lymphedema of left leg following remote DVT with PE; HTN; HLD; and DM presenting with AMS. Found to have sepsis due to UTI. For further details on admission please refer to H&P written by Dr. Lorin Mercy.  Hospital Course:  1-sepsis due to E. Coli UTI: -patient elevated lactic acidosis, tachycardia, tachypnea, fever and AMS on admission (meeting sepsis criteria) -Will discharge on ceftin BID to complete antibiotics therapy  -Continue supportive care and encourage good hydration   2-type 2 diabetes -will resume home hypoglycemic regimen  -follow CBG's as an outpatient and adjust hypoglycemic regimen as needed   3-HTN -BP even improved, still elevated (SBP 160-170) -will continue home clonidine and discharge on low dose losartan for better control -advise to follow heart healthy diet   4-Dementia -no hx of  Behavioral disturbances reported by family and none appreciated  during this hospitalization. -patient will be discharge home with [facility] services and 24/7 care and assistance. -will complete antibiotic therapy for superimposed toxic encephalopathy    5-acute toxic encephalopathy -due to UTI infection -will continue antibiotics and supportive care -patient mentation back to baseline  -checked TSH and B12 (WNL); RPR no reactive  6-chronic anticoagulation: due to prior hx of DVT/PE -will continue xarelto -no signs of acute bleeding appreciated  7-hyponatremia -Due to poor PO intake and dehydration most likely -improved/resolved with IVF's -Na 136 at discharge (and this is despite hyperglycemia)  -will recommend BMET to follow up trend   8-positive 1/2 MRSA on blood cultures -most likely contaminant  -repeat blood cx's from 4/19 has remained neg and w/o growth.  -patient has continue improving with current antibiotics  Procedures:  See below for x-ray reports   Consultations:  None   Discharge Exam: Vitals:   08/26/16 0450 08/26/16 1359  BP: (!) 173/89 (!) 163/83  Pulse: 93 93  Resp: 20 18  Temp: 98 F (36.7 C) 98.2 F (36.8 C)    General:  Patient has reamined afebrile, AAOX2; pleasantly confused and with vague insight. Patient's significant other at bedside expressed that patient is mentally improved and back to baseline.   Cardiovascular: regular rate, no rubs, no gallops, no JVD  Respiratory: CTA bilaterally; good air movement, no using accessory muscles  Abdomen: soft, NT, ND, positive BS, no guarding   Musculoskeletal: patient with 2++ edema LLE, trace to 1 plus RLE; no cyanosis or clubbing. No significant joint swelling or effusion appreciated.   Discharge Instructions   Discharge Instructions    Discharge instructions    Complete by:  As  directed    Maintain adequate hydration Take medications as prescribed Arrange follow up with PCP in 10 days Arrange follow up with orthopedic service as an outpatient for joint  tap. Follow a heart healthy diet and watch carbohydrates on patients diet.   Increase activity slowly    Complete by:  As directed      Current Discharge Medication List    START taking these medications   Details  cefUROXime (CEFTIN) 500 MG tablet Take 1 tablet (500 mg total) by mouth 2 (two) times daily with a meal. Qty: 12 tablet, Refills: 0    losartan (COZAAR) 25 MG tablet Take 1 tablet (25 mg total) by mouth daily. Qty: 30 tablet, Refills: 1      CONTINUE these medications which have NOT CHANGED   Details  acetaminophen (TYLENOL) 500 MG tablet Take 500 mg by mouth every 6 (six) hours as needed.    atorvastatin (LIPITOR) 20 MG tablet Take 20 mg by mouth daily at 6 PM.     B Complex Vitamins (VITAMIN B COMPLEX PO) Take 1 tablet by mouth daily.     cholecalciferol (VITAMIN D) 1000 units tablet Take 5,000 Units by mouth daily.     cloNIDine (CATAPRES) 0.1 MG tablet Take 0.1 mg by mouth 2 (two) times daily.    gabapentin (NEURONTIN) 300 MG capsule Take 300 mg by mouth 3 (three) times daily.    !! metFORMIN (GLUCOPHAGE) 1000 MG tablet Take 1,000 mg by mouth daily with breakfast.    !! metFORMIN (GLUCOPHAGE) 500 MG tablet Take 500 mg by mouth at bedtime.    Omega 3 1200 MG CAPS Take 1,200 mg by mouth daily.     OVER THE COUNTER MEDICATION Take 1 capsule by mouth daily. "ultra flore balance"    Rivaroxaban (XARELTO) 15 MG TABS tablet Take 1 tablet (15 mg total) by mouth daily with supper. 90 day supply Qty: 90 tablet, Refills: 3   Associated Diagnoses: History of DVT (deep vein thrombosis)    Dexlansoprazole (DEXILANT) 30 MG capsule Take 30 mg by mouth daily.     !! - Potential duplicate medications found. Please discuss with provider.     No Known Allergies Follow-up Information    Gildardo Cranker, DO. Schedule an appointment as soon as possible for a visit in 10 day(s).   Specialty:  Internal Medicine Contact information: [place]  [id number] [phone]            The results of significant diagnostics from this hospitalization (including imaging, microbiology, ancillary and laboratory) are listed below for reference.    Significant Diagnostic Studies: Dg Chest 2 View  Result Date: 08/23/2016 CLINICAL DATA:  Worsening mental status over the last 2 days. Dementia. EXAM: CHEST  2 VIEW COMPARISON:  05/11/2016 FINDINGS: Heart size is normal. Chronic aortic atherosclerosis. The lungs are clear. No consolidation or collapse. No effusions. No acute bone finding. IMPRESSION: No active cardiopulmonary disease. Electronically Signed   By: Nelson Chimes M.D.   On: 08/23/2016 15:12   Dg Pelvis 1-2 Views  Result Date: 08/23/2016 CLINICAL DATA:  Combative patient pelvic pain EXAM: PELVIS - 1-2 VIEW COMPARISON:  CT 03/08/2016, radiograph 03/08/2016 FINDINGS: Degenerative changes of the lower lumbar spine and SI joints. No fracture or malalignment. Mild to moderate arthritis of the hips. Pubic symphysis appears intact. Surgical clip in the right pelvis. Calcified pelvic phleboliths. Vascular calcifications. IMPRESSION: No acute osseous abnormality. Mild to moderate arthritis of the hips Electronically Signed  By: Donavan Foil M.D.   On: 08/23/2016 18:37   Ct Head Wo Contrast  Result Date: 08/23/2016 CLINICAL DATA:  Mental status changes EXAM: CT HEAD WITHOUT CONTRAST TECHNIQUE: Contiguous axial images were obtained from the base of the skull through the vertex without intravenous contrast. COMPARISON:  05/11/2016 FINDINGS: Brain: No intracranial hemorrhage, mass effect or midline shift. Moderate cerebral atrophy again noted. Again noted moderate periventricular and patchy subcortical white matter decreased attenuation consistent with chronic small vessel ischemic changes. No definite acute cortical infarction. No mass lesion is noted on this unenhanced scan. Vascular: Atherosclerotic calcifications of carotid siphon again noted.  Skull: No skull fracture. Sinuses/Orbits: No acute findings. Other: None IMPRESSION: No acute intracranial abnormality. Stable atrophy and chronic white matter disease. No definite acute cortical infarction. Electronically Signed   By: [name] Crocker M.D.   On: 08/23/2016 15:32    Microbiology: Recent Results (from the past 240 hour(s))  Blood culture (routine x 2)     Status: None (Preliminary result)   Collection Time: 08/23/16  5:10 PM  Result Value Ref Range Status   Specimen Description BLOOD LEFT ANTECUBITAL  Final   Special Requests   Final    BOTTLES DRAWN AEROBIC AND ANAEROBIC Blood Culture adequate volume   Culture   Final    NO GROWTH 2 DAYS Performed at [facility], [address] [place], [place] [address]    Report Status PENDING  Incomplete  Blood culture (routine x 2)     Status: Abnormal   Collection Time: 08/23/16  5:41 PM  Result Value Ref Range Status   Specimen Description BLOOD BLOOD RIGHT FOREARM  Final   Special Requests   Final    BOTTLES DRAWN AEROBIC AND ANAEROBIC Blood Culture adequate volume   Culture  Setup Time   Final    GRAM POSITIVE COCCI IN CLUSTERS AEROBIC BOTTLE ONLY CRITICAL RESULT CALLED TO, READ BACK BY AND VERIFIED WITH: E GREEN PHARMD 2237 08/24/16 A BROWNING    Culture (A)  Final    STAPHYLOCOCCUS EPIDERMIDIS THE SIGNIFICANCE OF ISOLATING THIS ORGANISM FROM A SINGLE SET OF BLOOD CULTURES WHEN MULTIPLE SETS ARE DRAWN IS UNCERTAIN. PLEASE NOTIFY THE MICROBIOLOGY DEPARTMENT WITHIN ONE WEEK IF SPECIATION AND SENSITIVITIES ARE REQUIRED. Performed at [facility], [place] [address] [place],  [address]    Report Status 08/26/2016 FINAL  Final  Blood Culture ID Panel (Reflexed)     Status: Abnormal   Collection Time: 08/23/16  5:41 PM  Result Value Ref Range Status   Enterococcus species NOT DETECTED NOT DETECTED Final   Listeria monocytogenes NOT DETECTED NOT DETECTED Final   Staphylococcus species DETECTED (A) NOT DETECTED Final     Comment: Methicillin (oxacillin) resistant coagulase negative staphylococcus. Possible blood culture contaminant (unless isolated from more than one blood culture draw or clinical case suggests pathogenicity). No antibiotic treatment is indicated for blood  culture contaminants. CRITICAL RESULT CALLED TO, READ BACK BY AND VERIFIED WITH: E GREEN PHARMD 2237 08/24/16 A BROWNING    Staphylococcus aureus NOT DETECTED NOT DETECTED Final   Methicillin resistance DETECTED (A) NOT DETECTED Final    Comment: CRITICAL RESULT CALLED TO, READ BACK BY AND VERIFIED WITH: E GREEN PHARMD 2237 08/24/16 A BROWNING    Streptococcus species NOT DETECTED NOT DETECTED Final   Streptococcus agalactiae NOT DETECTED NOT DETECTED Final   Streptococcus pneumoniae NOT DETECTED NOT DETECTED Final   Streptococcus pyogenes NOT DETECTED NOT DETECTED Final   Acinetobacter baumannii  NOT DETECTED NOT DETECTED Final   Enterobacteriaceae species NOT DETECTED NOT DETECTED Final   Enterobacter cloacae complex NOT DETECTED NOT DETECTED Final   Escherichia coli NOT DETECTED NOT DETECTED Final   Klebsiella oxytoca NOT DETECTED NOT DETECTED Final   Klebsiella pneumoniae NOT DETECTED NOT DETECTED Final   Proteus species NOT DETECTED NOT DETECTED Final   Serratia marcescens NOT DETECTED NOT DETECTED Final   Haemophilus influenzae NOT DETECTED NOT DETECTED Final   Neisseria meningitidis NOT DETECTED NOT DETECTED Final   Pseudomonas aeruginosa NOT DETECTED NOT DETECTED Final   Candida albicans NOT DETECTED NOT DETECTED Final   Candida glabrata NOT DETECTED NOT DETECTED Final   Candida krusei NOT DETECTED NOT DETECTED Final   Candida parapsilosis NOT DETECTED NOT DETECTED Final   Candida tropicalis NOT DETECTED NOT DETECTED Final    Comment: Performed at [facility], [place] [address], [place], [place] [address]  Urine culture     Status: Abnormal   Collection Time: 08/23/16  7:49 PM  Result Value Ref Range Status    Specimen Description URINE, RANDOM  Final   Special Requests NONE  Final   Culture >=100,000 COLONIES/mL ESCHERICHIA COLI (A)  Final   Report Status 08/26/2016 FINAL  Final   Organism ID, Bacteria ESCHERICHIA COLI (A)  Final      Susceptibility   Escherichia coli - MIC*    AMPICILLIN >=32 RESISTANT Resistant     CEFAZOLIN <=4 SENSITIVE Sensitive     CEFTRIAXONE <=1 SENSITIVE Sensitive     CIPROFLOXACIN <=0.25 SENSITIVE Sensitive     GENTAMICIN <=1 SENSITIVE Sensitive     IMIPENEM <=0.25 SENSITIVE Sensitive     NITROFURANTOIN <=16 SENSITIVE Sensitive     TRIMETH/SULFA >=320 RESISTANT Resistant     AMPICILLIN/SULBACTAM 16 INTERMEDIATE Intermediate     PIP/TAZO <=4 SENSITIVE Sensitive     Extended ESBL NEGATIVE Sensitive     * >=100,000 COLONIES/mL ESCHERICHIA COLI     Labs: Basic Metabolic Panel:  Recent Labs Lab 08/23/16 1708 08/24/16 0255 08/25/16 0403  NA 133* 132* 136  K 4.0 3.5 3.5  CL 95* 99* 102  CO2 27 24 26   GLUCOSE 242* 250* 214*  BUN 11 9 9   CREATININE 0.54 0.40* 0.44  CALCIUM 9.2 8.4* 8.2*   Liver Function Tests:  Recent Labs Lab 08/23/16 1708  AST 20  ALT 15  ALKPHOS 65  BILITOT 0.7  PROT 7.7  ALBUMIN 3.5   CBC:  Recent Labs Lab 08/23/16 1708 08/24/16 0255 08/25/16 0403  WBC 9.3 9.6 6.8  NEUTROABS 7.1  --   --   HGB 11.7* 10.5* 9.3*  HCT 35.6* 32.9* 29.2*  MCV 84.8 85.7 84.4  PLT 265 218 229   Cardiac Enzymes:  Recent Labs Lab 08/23/16 1709 08/23/16 1730  CKTOTAL  --  49  TROPONINI <0.03  --    CBG:  Recent Labs Lab 08/25/16 1203 08/25/16 1715 08/25/16 2112 08/26/16 0741 08/26/16 1227  GLUCAP 258* 213* 248* 209* 264*    Signed:  Barton Dubois MD.  [facility] 08/26/2016, 3:09 PM

## 2016-08-27 DIAGNOSIS — M1712 Unilateral primary osteoarthritis, left knee: Secondary | ICD-10-CM | POA: Diagnosis not present

## 2016-08-28 LAB — CULTURE, BLOOD (ROUTINE X 2)
Culture: NO GROWTH
Special Requests: ADEQUATE

## 2016-08-29 NOTE — Telephone Encounter (Signed)
This form was given to [employer]. It requires information that is not in her chart and I have never seen her.

## 2016-08-30 LAB — CULTURE, BLOOD (ROUTINE X 2)
CULTURE: NO GROWTH
Culture: NO GROWTH
Special Requests: ADEQUATE
Special Requests: ADEQUATE

## 2016-09-01 ENCOUNTER — Ambulatory Visit (INDEPENDENT_AMBULATORY_CARE_PROVIDER_SITE_OTHER): Payer: Medicare Other | Admitting: Internal Medicine

## 2016-09-01 ENCOUNTER — Encounter: Payer: Self-pay | Admitting: Internal Medicine

## 2016-09-01 VITALS — BP 126/68 | HR 85 | Temp 98.0°F | Resp 16 | Ht 66.0 in

## 2016-09-01 DIAGNOSIS — M1712 Unilateral primary osteoarthritis, left knee: Secondary | ICD-10-CM | POA: Diagnosis not present

## 2016-09-01 DIAGNOSIS — E119 Type 2 diabetes mellitus without complications: Secondary | ICD-10-CM | POA: Diagnosis not present

## 2016-09-01 DIAGNOSIS — Z86718 Personal history of other venous thrombosis and embolism: Secondary | ICD-10-CM | POA: Diagnosis not present

## 2016-09-01 DIAGNOSIS — Z7901 Long term (current) use of anticoagulants: Secondary | ICD-10-CM | POA: Diagnosis not present

## 2016-09-01 DIAGNOSIS — F0150 Vascular dementia without behavioral disturbance: Secondary | ICD-10-CM

## 2016-09-01 DIAGNOSIS — E871 Hypo-osmolality and hyponatremia: Secondary | ICD-10-CM | POA: Diagnosis not present

## 2016-09-01 DIAGNOSIS — A419 Sepsis, unspecified organism: Secondary | ICD-10-CM

## 2016-09-01 DIAGNOSIS — N390 Urinary tract infection, site not specified: Secondary | ICD-10-CM | POA: Diagnosis not present

## 2016-09-01 DIAGNOSIS — F05 Delirium due to known physiological condition: Secondary | ICD-10-CM | POA: Diagnosis not present

## 2016-09-01 LAB — BASIC METABOLIC PANEL
BUN: 12 mg/dL (ref 7–25)
CO2: 27 mmol/L (ref 20–31)
Calcium: 9.2 mg/dL (ref 8.6–10.4)
Chloride: 98 mmol/L (ref 98–110)
Creat: 0.59 mg/dL — ABNORMAL LOW (ref 0.60–0.93)
Glucose, Bld: 288 mg/dL — ABNORMAL HIGH (ref 65–99)
Potassium: 4.7 mmol/L (ref 3.5–5.3)
Sodium: 137 mmol/L (ref 135–146)

## 2016-09-01 LAB — CBC WITH DIFFERENTIAL/PLATELET
Basophils Absolute: 0 cells/uL (ref 0–200)
Basophils Relative: 0 %
Eosinophils Absolute: 52 cells/uL (ref 15–500)
Eosinophils Relative: 1 %
HCT: 33.1 % — ABNORMAL LOW (ref 35.0–45.0)
Hemoglobin: 10 g/dL — ABNORMAL LOW (ref 11.7–15.5)
Lymphocytes Relative: 29 %
Lymphs Abs: 1508 cells/uL (ref 850–3900)
MCH: 26.7 pg — ABNORMAL LOW (ref 27.0–33.0)
MCHC: 30.2 g/dL — ABNORMAL LOW (ref 32.0–36.0)
MCV: 88.5 fL (ref 80.0–100.0)
MPV: 8.9 fL (ref 7.5–12.5)
Monocytes Absolute: 468 cells/uL (ref 200–950)
Monocytes Relative: 9 %
Neutro Abs: 3172 cells/uL (ref 1500–7800)
Neutrophils Relative %: 61 %
Platelets: 347 10*3/uL (ref 140–400)
RBC: 3.74 MIL/uL — ABNORMAL LOW (ref 3.80–5.10)
RDW: 13.4 % (ref 11.0–15.0)
WBC: 5.2 10*3/uL (ref 3.8–10.8)

## 2016-09-01 NOTE — Progress Notes (Signed)
Location:  [facility] clinic Provider: Ashara Lounsbury L. Mariea Clonts, D.O., C.M.D.  Code Status: DNR Goals of Care:  Advanced Directives 09/01/2016  Does Patient Have a Medical Advance Directive? Yes  Type of Paramedic of [place];Living will  Does patient want to make changes to medical advance directive? -  Copy of [place] in Chart? Yes   Chief Complaint  Patient presents with  . Transitions Of Care    08/23/2016 - 08/26/2016 (3 days) 6 days since discharge. Not able to get a weight.    HPI: Patient is a 81 y.o. female seen today for hospital follow-up s/p admission from 4/17-4/20 with sepsis secondary to E coli UTI, delirium, prior h/o DVT/PE on anticoagulation and diabetes mellitus II ( Lab Results  Component Value Date   HGBA1C 6.1 (H) 05/11/2016  She met sepsis criteria with lactic acidoses, tachycardia, tachypnea, fever and delirium.  1/2 cultures were positive for MRSA.  Repeat cxs 4/19 were negative.  She was d/cd on ceftin po bid and to complete course and push fluids. She also had hyponatremia due to dehydration and improved with IVFs to 136 at discharge.    Her b12, folate and RPR were normal.  She was sent home with home health services and PCP f/u.  It was recommended that she f/u with orthopedics to have fluid removed from her knee joint.    Her bp was elevated and losartan was added to her regimen.  Past Medical History:  Diagnosis Date  . Dementia   . Diabetes mellitus type II 03/21/2011  . DVT (deep venous thrombosis) ([place]) 03/21/2011  . Hyperlipidemia 03/21/2011  . Hypertension 03/21/2011  . Lymphedema of leg 03/21/2011  . Pulmonary embolism ([place]) 03/21/2011    Past Surgical History:  Procedure Laterality Date  . CHOLECYSTECTOMY  2015  . SKIN TAG REMOVAL  07/25/2016  . VAGINAL HYSTERECTOMY     unknown of date  . VASCULAR SURGERY      No Known Allergies  Allergies as of 09/01/2016   No Known Allergies     Medication  List       Accurate as of 09/01/16  1:41 PM. Always use your most recent med list.          acetaminophen 500 MG tablet Commonly known as:  TYLENOL Take 500 mg by mouth every 6 (six) hours as needed.   atorvastatin 20 MG tablet Commonly known as:  LIPITOR Take 20 mg by mouth daily at 6 PM.   cefUROXime 500 MG tablet Commonly known as:  CEFTIN Take 1 tablet (500 mg total) by mouth 2 (two) times daily with a meal.   cholecalciferol 1000 units tablet Commonly known as:  VITAMIN D Take 5,000 Units by mouth daily.   cloNIDine 0.1 MG tablet Commonly known as:  CATAPRES Take 0.1 mg by mouth 2 (two) times daily.   DEXILANT 30 MG capsule Generic drug:  Dexlansoprazole Take 30 mg by mouth daily.   diclofenac sodium 1 % Gel Commonly known as:  VOLTAREN Apply topically 3 (three) times daily.   gabapentin 300 MG capsule Commonly known as:  NEURONTIN Take 300 mg by mouth 3 (three) times daily.   losartan 25 MG tablet Commonly known as:  COZAAR Take 1 tablet (25 mg total) by mouth daily.   metFORMIN 1000 MG tablet Commonly known as:  GLUCOPHAGE Take 1,000 mg by mouth daily with breakfast.   metFORMIN 500 MG tablet Commonly known as:  GLUCOPHAGE Take 500 mg  by mouth at bedtime.   Omega 3 1200 MG Caps Take 1,200 mg by mouth daily.   OVER THE COUNTER MEDICATION Take 1 capsule by mouth daily. "ultra flore balance"   Rivaroxaban 15 MG Tabs tablet Commonly known as:  XARELTO Take 1 tablet (15 mg total) by mouth daily with supper. 90 day supply   VITAMIN B COMPLEX PO Take 1 tablet by mouth daily.       Review of Systems:  Review of Systems  Constitutional: Negative for chills, fever and malaise/fatigue.  HENT: Negative for congestion and hearing loss.   Eyes: Negative for blurred vision.  Respiratory: Negative for shortness of breath.   Cardiovascular: Negative for chest pain, palpitations and leg swelling.  Gastrointestinal: Negative for abdominal pain.    Genitourinary: Negative for dysuria.  Neurological: Negative for dizziness, loss of consciousness and weakness.  Psychiatric/Behavioral: Positive for memory loss.    Health Maintenance  Topic Date Due  . FOOT EXAM  08/08/1945  . OPHTHALMOLOGY EXAM  08/08/1947  . TETANUS/TDAP  08/07/1956  . DEXA SCAN  08/08/2002  . PNA vac Low Risk Adult (1 of 2 - PCV13) 08/08/2002  . INFLUENZA VACCINE  05/24/2017 (Originally 12/07/2016)  . HEMOGLOBIN A1C  11/08/2016    Physical Exam: Vitals:   09/01/16 1328  BP: (!) 144/80  Pulse: 85  Resp: 16  Temp: 98 F (36.7 C)  TempSrc: Oral  SpO2: 98%  Height: 5' 6"  (1.676 m)   There is no height or weight on file to calculate BMI. Physical Exam  Constitutional: No distress.  Cachectic frail female seated in wheelchair falling asleep and leaning to left  Cardiovascular: Normal rate, regular rhythm, normal heart sounds and intact distal pulses.   Chronic edema of bilateral LEs left greater than right  Pulmonary/Chest: Effort normal and breath sounds normal. No respiratory distress.  Musculoskeletal:  Developing contractures of extremities  Neurological:  Still hypoactively delirious and drowsy  Skin: Skin is warm and dry.    Labs reviewed: Basic Metabolic Panel:  Recent Labs  05/12/16 0723  05/13/16 1009  08/23/16 1708 08/24/16 0255 08/25/16 0403  NA  --   < >  --   < > 133* 132* 136  K  --   < >  --   < > 4.0 3.5 3.5  CL  --   < >  --   < > 95* 99* 102  CO2  --   < >  --   < > 27 24 26   GLUCOSE  --   < >  --   < > 242* 250* 214*  BUN  --   < >  --   < > 11 9 9   CREATININE  --   < >  --   < > 0.54 0.40* 0.44  CALCIUM  --   < >  --   < > 9.2 8.4* 8.2*  TSH 6.336*  --  2.831  --   --   --  1.986  < > = values in this interval not displayed. Liver Function Tests:  Recent Labs  06/01/16 1400 07/13/16 1458 08/23/16 1708  AST 19 12 20   ALT 25 11 15   ALKPHOS 64 77 65  BILITOT 0.60 <0.2 0.7  PROT 6.9 6.9 7.7  ALBUMIN 3.0* 3.6 3.5    No results for input(s): LIPASE, AMYLASE in the last 8760 hours. No results for input(s): AMMONIA in the last 8760 hours. CBC:  Recent Labs  07/05/16 07/13/16  1458 08/23/16 1708 08/24/16 0255 08/25/16 0403  WBC 7.4 5.6 9.3 9.6 6.8  NEUTROABS 5 3.5 7.1  --   --   HGB 11.8* 11.5* 11.7* 10.5* 9.3*  HCT 38 36.3 35.6* 32.9* 29.2*  MCV  --  91 84.8 85.7 84.4  PLT 248 238 265 218 229   Lipid Panel:  Recent Labs  03/30/16 05/12/16 0723  CHOL 143 148  HDL 51 43  LDLCALC 80 96  TRIG 123 46  CHOLHDL  --  3.4   Lab Results  Component Value Date   HGBA1C 6.1 (H) 05/11/2016    Procedures since last visit: Dg Chest 2 View  Result Date: 08/23/2016 CLINICAL DATA:  Worsening mental status over the last 2 days. Dementia. EXAM: CHEST  2 VIEW COMPARISON:  05/11/2016 FINDINGS: Heart size is normal. Chronic aortic atherosclerosis. The lungs are clear. No consolidation or collapse. No effusions. No acute bone finding. IMPRESSION: No active cardiopulmonary disease. Electronically Signed   By: Nelson Chimes M.D.   On: 08/23/2016 15:12   Dg Pelvis 1-2 Views  Result Date: 08/23/2016 CLINICAL DATA:  Combative patient pelvic pain EXAM: PELVIS - 1-2 VIEW COMPARISON:  CT 03/08/2016, radiograph 03/08/2016 FINDINGS: Degenerative changes of the lower lumbar spine and SI joints. No fracture or malalignment. Mild to moderate arthritis of the hips. Pubic symphysis appears intact. Surgical clip in the right pelvis. Calcified pelvic phleboliths. Vascular calcifications. IMPRESSION: No acute osseous abnormality. Mild to moderate arthritis of the hips Electronically Signed   By: Donavan Foil M.D.   On: 08/23/2016 18:37   Ct Head Wo Contrast  Result Date: 08/23/2016 CLINICAL DATA:  Mental status changes EXAM: CT HEAD WITHOUT CONTRAST TECHNIQUE: Contiguous axial images were obtained from the base of the skull through the vertex without intravenous contrast. COMPARISON:  05/11/2016 FINDINGS: Brain: No  intracranial hemorrhage, mass effect or midline shift. Moderate cerebral atrophy again noted. Again noted moderate periventricular and patchy subcortical white matter decreased attenuation consistent with chronic small vessel ischemic changes. No definite acute cortical infarction. No mass lesion is noted on this unenhanced scan. Vascular: Atherosclerotic calcifications of carotid siphon again noted. Skull: No skull fracture. Sinuses/Orbits: No acute findings. Other: None IMPRESSION: No acute intracranial abnormality. Stable atrophy and chronic white matter disease. No definite acute cortical infarction. Electronically Signed   By: [name] Crocker M.D.   On: 08/23/2016 15:32    Assessment/Plan 1. Sepsis secondary to UTI ([place]) - finishes abx today, improved, but still somewhat hypoactively delirious - CBC with Differential/Platelet - Basic metabolic panel  2. Delirium due to another medical condition, persistent, hypoactive -ongoing, f/u labs, push fluids - pt may not return to her prior level of cognition (which was already advanced dementia)--recommend palliative care--discussed this the last time I'd seen her but I did not make progress - CBC with Differential/Platelet - Basic metabolic panel  3. Primary osteoarthritis of left knee -ongoing, had some improvement in pain with steroid injection, also cont voltaren gel to the left knee qid prn pain  4. Diabetes mellitus type II, non insulin dependent (HCC) -cont metformin 1062m po bid ac meals -reportedly she is eating and drinking, but had difficulty getting her pills this am (which is why there is still one abx left)  5. History of DVT (deep vein thrombosis) - on chronic xarelto, f/u labs - CBC with Differential/Platelet  6. Current use of long term anticoagulation -on chronic xarelto - CBC with Differential/Platelet  7. Vascular dementia without behavioral disturbance -is late stage,  pt should qualify for hospice care with her level of  function, but family does not seem to be ready for this option   8. Hyponatremia - f/u labs due to new losartan for bp and sodium levels being low from poor po intake in context of her illness - Basic metabolic panel  Labs/tests ordered:   Orders Placed This Encounter  Procedures  . CBC with Differential/Platelet  . Basic metabolic panel    Order Specific Question:   Has the patient fasted?    Answer:   Yes    Next appt:  09/26/2016  Yashvi Jasinski L. [place], D.O. [facility] [address], [place] [address] Cell Phone (Mon-Fri 8am-5pm):  [phone] On Call:  [phone] & follow prompts after 5pm & weekends Office Phone:  [phone] Office Fax:  [phone]

## 2016-09-01 NOTE — Patient Instructions (Signed)
Please check with home health to make sure they are planning to come back out for PT, OT.

## 2016-09-05 DIAGNOSIS — Z8673 Personal history of transient ischemic attack (TIA), and cerebral infarction without residual deficits: Secondary | ICD-10-CM | POA: Diagnosis not present

## 2016-09-05 DIAGNOSIS — I10 Essential (primary) hypertension: Secondary | ICD-10-CM | POA: Diagnosis not present

## 2016-09-05 DIAGNOSIS — E119 Type 2 diabetes mellitus without complications: Secondary | ICD-10-CM | POA: Diagnosis not present

## 2016-09-05 DIAGNOSIS — E785 Hyperlipidemia, unspecified: Secondary | ICD-10-CM | POA: Diagnosis not present

## 2016-09-05 DIAGNOSIS — R627 Adult failure to thrive: Secondary | ICD-10-CM | POA: Diagnosis not present

## 2016-09-05 DIAGNOSIS — F0390 Unspecified dementia without behavioral disturbance: Secondary | ICD-10-CM | POA: Diagnosis not present

## 2016-09-07 DIAGNOSIS — Z8673 Personal history of transient ischemic attack (TIA), and cerebral infarction without residual deficits: Secondary | ICD-10-CM | POA: Diagnosis not present

## 2016-09-07 DIAGNOSIS — E119 Type 2 diabetes mellitus without complications: Secondary | ICD-10-CM | POA: Diagnosis not present

## 2016-09-07 DIAGNOSIS — I10 Essential (primary) hypertension: Secondary | ICD-10-CM | POA: Diagnosis not present

## 2016-09-07 DIAGNOSIS — F0390 Unspecified dementia without behavioral disturbance: Secondary | ICD-10-CM | POA: Diagnosis not present

## 2016-09-07 DIAGNOSIS — E785 Hyperlipidemia, unspecified: Secondary | ICD-10-CM | POA: Diagnosis not present

## 2016-09-07 DIAGNOSIS — R627 Adult failure to thrive: Secondary | ICD-10-CM | POA: Diagnosis not present

## 2016-09-08 ENCOUNTER — Telehealth: Payer: Self-pay

## 2016-09-08 DIAGNOSIS — R627 Adult failure to thrive: Secondary | ICD-10-CM | POA: Diagnosis not present

## 2016-09-08 DIAGNOSIS — F0390 Unspecified dementia without behavioral disturbance: Secondary | ICD-10-CM | POA: Diagnosis not present

## 2016-09-08 DIAGNOSIS — I10 Essential (primary) hypertension: Secondary | ICD-10-CM | POA: Diagnosis not present

## 2016-09-08 DIAGNOSIS — Z8673 Personal history of transient ischemic attack (TIA), and cerebral infarction without residual deficits: Secondary | ICD-10-CM | POA: Diagnosis not present

## 2016-09-08 DIAGNOSIS — E785 Hyperlipidemia, unspecified: Secondary | ICD-10-CM | POA: Diagnosis not present

## 2016-09-08 DIAGNOSIS — E119 Type 2 diabetes mellitus without complications: Secondary | ICD-10-CM | POA: Diagnosis not present

## 2016-09-08 NOTE — Telephone Encounter (Signed)
I don't think she needs another urine culture at this time.

## 2016-09-08 NOTE — Telephone Encounter (Signed)
[name] from [facility] called back to make sure the patient did not need a follow- up urine sample done with her last appointment which is next week.   [name] was told that she did not need to do a follow up urine collection since the patient has been seen in the office since her hospital stay. The patient is asymptomatic.   Message will be sent to provider to be verified.

## 2016-09-08 NOTE — Telephone Encounter (Signed)
[name] with [facility] called to inform Dr. Eulas Post that the patient's care will end on 09/17/16. Everything is at a maintained level, if Dr. Eulas Post feels the need that the patient needs more home care than the patient will have to be re certified.   Called [name] to let her know and ask her if she had any concerns. [name] stated she would like to keep the PT and OT for patient is that is allowed by Dr. Eulas Post.

## 2016-09-14 ENCOUNTER — Other Ambulatory Visit: Payer: Self-pay | Admitting: *Deleted

## 2016-09-14 DIAGNOSIS — Z86718 Personal history of other venous thrombosis and embolism: Secondary | ICD-10-CM

## 2016-09-14 MED ORDER — GABAPENTIN 300 MG PO CAPS: 300.0000 mg | ORAL_CAPSULE | Freq: Three times a day (TID) | ORAL | 1 refills | Status: DC

## 2016-09-14 MED ORDER — LOSARTAN POTASSIUM 25 MG PO TABS: 25.0000 mg | ORAL_TABLET | Freq: Every day | ORAL | 1 refills | Status: DC

## 2016-09-14 MED ORDER — ATORVASTATIN CALCIUM 20 MG PO TABS: 20.0000 mg | ORAL_TABLET | Freq: Every day | ORAL | 1 refills | Status: DC

## 2016-09-14 MED ORDER — RIVAROXABAN 15 MG PO TABS: 15.0000 mg | ORAL_TABLET | Freq: Every day | ORAL | 1 refills | Status: DC

## 2016-09-14 MED ORDER — METFORMIN HCL 1000 MG PO TABS: 1000.0000 mg | ORAL_TABLET | Freq: Every day | ORAL | 1 refills | Status: DC

## 2016-09-14 MED ORDER — CLONIDINE HCL 0.1 MG PO TABS: 0.1000 mg | ORAL_TABLET | Freq: Two times a day (BID) | ORAL | 1 refills | Status: DC

## 2016-09-14 NOTE — Telephone Encounter (Signed)
Patient daughter, patrice requested Rx's to be sent to [facility]

## 2016-09-15 ENCOUNTER — Telehealth: Payer: Self-pay

## 2016-09-15 DIAGNOSIS — R627 Adult failure to thrive: Secondary | ICD-10-CM | POA: Diagnosis not present

## 2016-09-15 DIAGNOSIS — F0390 Unspecified dementia without behavioral disturbance: Secondary | ICD-10-CM | POA: Diagnosis not present

## 2016-09-15 DIAGNOSIS — I10 Essential (primary) hypertension: Secondary | ICD-10-CM | POA: Diagnosis not present

## 2016-09-15 DIAGNOSIS — Z8673 Personal history of transient ischemic attack (TIA), and cerebral infarction without residual deficits: Secondary | ICD-10-CM | POA: Diagnosis not present

## 2016-09-15 DIAGNOSIS — E785 Hyperlipidemia, unspecified: Secondary | ICD-10-CM | POA: Diagnosis not present

## 2016-09-15 DIAGNOSIS — E119 Type 2 diabetes mellitus without complications: Secondary | ICD-10-CM | POA: Diagnosis not present

## 2016-09-15 NOTE — Telephone Encounter (Signed)
[facility] would like to order PT for patient once a week for 7 weeks and OT eval and treat please advise.  FYI [facility] will be discharging patient from the nursing part only. Per Feliz Beam [phone]

## 2016-09-15 NOTE — Telephone Encounter (Signed)
okay

## 2016-09-16 ENCOUNTER — Other Ambulatory Visit: Payer: Self-pay

## 2016-09-16 MED ORDER — GLUCOSE BLOOD VI STRP: 1.0000 | ORAL_STRIP | Freq: Two times a day (BID) | 5 refills | Status: DC

## 2016-09-16 NOTE — Telephone Encounter (Signed)
Spoke with [place] and notified her of order.

## 2016-09-18 DIAGNOSIS — I10 Essential (primary) hypertension: Secondary | ICD-10-CM | POA: Diagnosis not present

## 2016-09-18 DIAGNOSIS — E119 Type 2 diabetes mellitus without complications: Secondary | ICD-10-CM | POA: Diagnosis not present

## 2016-09-18 DIAGNOSIS — Z87440 Personal history of urinary (tract) infections: Secondary | ICD-10-CM | POA: Diagnosis not present

## 2016-09-18 DIAGNOSIS — I890 Lymphedema, not elsewhere classified: Secondary | ICD-10-CM | POA: Diagnosis not present

## 2016-09-18 DIAGNOSIS — Z8673 Personal history of transient ischemic attack (TIA), and cerebral infarction without residual deficits: Secondary | ICD-10-CM | POA: Diagnosis not present

## 2016-09-18 DIAGNOSIS — R627 Adult failure to thrive: Secondary | ICD-10-CM | POA: Diagnosis not present

## 2016-09-18 DIAGNOSIS — Z86718 Personal history of other venous thrombosis and embolism: Secondary | ICD-10-CM | POA: Diagnosis not present

## 2016-09-18 DIAGNOSIS — Z7901 Long term (current) use of anticoagulants: Secondary | ICD-10-CM | POA: Diagnosis not present

## 2016-09-18 DIAGNOSIS — Z86711 Personal history of pulmonary embolism: Secondary | ICD-10-CM | POA: Diagnosis not present

## 2016-09-18 DIAGNOSIS — F0390 Unspecified dementia without behavioral disturbance: Secondary | ICD-10-CM | POA: Diagnosis not present

## 2016-09-18 DIAGNOSIS — Z5181 Encounter for therapeutic drug level monitoring: Secondary | ICD-10-CM | POA: Diagnosis not present

## 2016-09-18 DIAGNOSIS — E785 Hyperlipidemia, unspecified: Secondary | ICD-10-CM | POA: Diagnosis not present

## 2016-09-23 DIAGNOSIS — Z8673 Personal history of transient ischemic attack (TIA), and cerebral infarction without residual deficits: Secondary | ICD-10-CM | POA: Diagnosis not present

## 2016-09-23 DIAGNOSIS — Z87440 Personal history of urinary (tract) infections: Secondary | ICD-10-CM | POA: Diagnosis not present

## 2016-09-23 DIAGNOSIS — I10 Essential (primary) hypertension: Secondary | ICD-10-CM | POA: Diagnosis not present

## 2016-09-23 DIAGNOSIS — E119 Type 2 diabetes mellitus without complications: Secondary | ICD-10-CM | POA: Diagnosis not present

## 2016-09-23 DIAGNOSIS — F0390 Unspecified dementia without behavioral disturbance: Secondary | ICD-10-CM | POA: Diagnosis not present

## 2016-09-23 DIAGNOSIS — R627 Adult failure to thrive: Secondary | ICD-10-CM | POA: Diagnosis not present

## 2016-09-26 ENCOUNTER — Other Ambulatory Visit: Payer: Medicare Other

## 2016-09-26 ENCOUNTER — Other Ambulatory Visit: Payer: Self-pay

## 2016-09-26 DIAGNOSIS — R946 Abnormal results of thyroid function studies: Secondary | ICD-10-CM | POA: Diagnosis not present

## 2016-09-26 DIAGNOSIS — E1142 Type 2 diabetes mellitus with diabetic polyneuropathy: Secondary | ICD-10-CM | POA: Diagnosis not present

## 2016-09-26 DIAGNOSIS — E782 Mixed hyperlipidemia: Secondary | ICD-10-CM

## 2016-09-27 DIAGNOSIS — E119 Type 2 diabetes mellitus without complications: Secondary | ICD-10-CM | POA: Diagnosis not present

## 2016-09-27 DIAGNOSIS — Z87440 Personal history of urinary (tract) infections: Secondary | ICD-10-CM | POA: Diagnosis not present

## 2016-09-27 DIAGNOSIS — R627 Adult failure to thrive: Secondary | ICD-10-CM | POA: Diagnosis not present

## 2016-09-27 DIAGNOSIS — I10 Essential (primary) hypertension: Secondary | ICD-10-CM | POA: Diagnosis not present

## 2016-09-27 DIAGNOSIS — F0390 Unspecified dementia without behavioral disturbance: Secondary | ICD-10-CM | POA: Diagnosis not present

## 2016-09-27 DIAGNOSIS — Z8673 Personal history of transient ischemic attack (TIA), and cerebral infarction without residual deficits: Secondary | ICD-10-CM | POA: Diagnosis not present

## 2016-09-27 LAB — BASIC METABOLIC PANEL WITH GFR
BUN: 11 mg/dL (ref 7–25)
CALCIUM: 9.4 mg/dL (ref 8.6–10.4)
CO2: 26 mmol/L (ref 20–31)
CREATININE: 0.53 mg/dL — AB (ref 0.60–0.93)
Chloride: 102 mmol/L (ref 98–110)
GLUCOSE: 138 mg/dL — AB (ref 65–99)
Potassium: 4.5 mmol/L (ref 3.5–5.3)
Sodium: 140 mmol/L (ref 135–146)

## 2016-09-27 LAB — LIPID PANEL
Cholesterol: 155 mg/dL (ref <200)
HDL: 53 mg/dL (ref >50)
LDL CALC: 90 mg/dL (ref <100)
Total CHOL/HDL Ratio: 2.9 Ratio (ref <5.0)
Triglycerides: 62 mg/dL (ref <150)
VLDL: 12 mg/dL (ref <30)

## 2016-09-27 LAB — TSH: TSH: 3.37 m[IU]/L

## 2016-09-27 LAB — T4, FREE: Free T4: 1.1 ng/dL (ref 0.8–1.8)

## 2016-09-28 ENCOUNTER — Ambulatory Visit (INDEPENDENT_AMBULATORY_CARE_PROVIDER_SITE_OTHER): Payer: Medicare Other | Admitting: Internal Medicine

## 2016-09-28 ENCOUNTER — Encounter: Payer: Self-pay | Admitting: Internal Medicine

## 2016-09-28 VITALS — BP 154/78 | HR 85 | Temp 98.7°F

## 2016-09-28 DIAGNOSIS — I639 Cerebral infarction, unspecified: Secondary | ICD-10-CM

## 2016-09-28 DIAGNOSIS — H1033 Unspecified acute conjunctivitis, bilateral: Secondary | ICD-10-CM | POA: Diagnosis not present

## 2016-09-28 DIAGNOSIS — I890 Lymphedema, not elsewhere classified: Secondary | ICD-10-CM

## 2016-09-28 DIAGNOSIS — E782 Mixed hyperlipidemia: Secondary | ICD-10-CM

## 2016-09-28 DIAGNOSIS — R627 Adult failure to thrive: Secondary | ICD-10-CM | POA: Diagnosis not present

## 2016-09-28 DIAGNOSIS — Z86718 Personal history of other venous thrombosis and embolism: Secondary | ICD-10-CM

## 2016-09-28 DIAGNOSIS — M1712 Unilateral primary osteoarthritis, left knee: Secondary | ICD-10-CM

## 2016-09-28 DIAGNOSIS — E119 Type 2 diabetes mellitus without complications: Secondary | ICD-10-CM | POA: Diagnosis not present

## 2016-09-28 DIAGNOSIS — I10 Essential (primary) hypertension: Secondary | ICD-10-CM

## 2016-09-28 DIAGNOSIS — F0390 Unspecified dementia without behavioral disturbance: Secondary | ICD-10-CM | POA: Diagnosis not present

## 2016-09-28 DIAGNOSIS — E1142 Type 2 diabetes mellitus with diabetic polyneuropathy: Secondary | ICD-10-CM | POA: Diagnosis not present

## 2016-09-28 DIAGNOSIS — Z8673 Personal history of transient ischemic attack (TIA), and cerebral infarction without residual deficits: Secondary | ICD-10-CM | POA: Diagnosis not present

## 2016-09-28 DIAGNOSIS — Z87440 Personal history of urinary (tract) infections: Secondary | ICD-10-CM | POA: Diagnosis not present

## 2016-09-28 MED ORDER — CIPROFLOXACIN HCL 0.3 % OP SOLN: OPHTHALMIC | 1 refills | Status: DC

## 2016-09-28 NOTE — Patient Instructions (Signed)
Use eye drops 1 drop in each eye 4 times daily x 7 days  Wash hands frequently especially when touching eyes  Continue other medications as ordered  Follow up with specialists as scheduled  follow up in 3 mos for DM, HTN, dementia

## 2016-09-28 NOTE — Progress Notes (Signed)
Patient ID: Taraneh Metheney, female   DOB: 1935/06/15, 81 y.o.   MRN: [id number]    Location:  [facility] Place of Service: OFFICE  Chief Complaint  Patient presents with  . Medical Management of Chronic Issues    3 month routine visit    HPI:  81 yo female seen today for f/u. Family reports increased tearing, itchy and red x 5 days. No f/c. Pt states eyes feel like "sand in my eyes". She has gained 13 lbs since Feb 2018.   She had left knee pain and rec'd steroid injection in 08/2016. Pain improved. voltaren gel helps also.  Dementia - currently not on any medication. Family stopped exelon patch as she not showing any signs of improvement in QOL  DM - on metformin. takes gabapentin for neuropathy. A1c 6.1%. FBS 80-90s.  Lymphedema - chronic b/l LE. She does not take diuretic. Tried TED stockings in the past but could not tolerate them.   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 LDL 90  HTN - takes clonidine  Hypokalemia - takes Kdur. K 4.5  GERD - stable on dexilant  Hx CVA - stable on xeralto.LDL 90  Past Medical History:  Diagnosis Date  . Dementia   . Diabetes mellitus type II 03/21/2011  . DVT (deep venous thrombosis) ([place]) 03/21/2011  . Hyperlipidemia 03/21/2011  . Hypertension 03/21/2011  . Lymphedema of leg 03/21/2011  . Pulmonary embolism ([place]) 03/21/2011    Past Surgical History:  Procedure Laterality Date  . CHOLECYSTECTOMY  2015  . SKIN TAG REMOVAL  07/25/2016  . VAGINAL HYSTERECTOMY     unknown of date  . VASCULAR SURGERY      Patient Care Team: Gildardo Cranker, DO as PCP - General (Internal Medicine) Marin Olp Rudell Cobb, MD as Consulting Physician (Oncology)  Social History   Social History  . Marital status: Widowed    Spouse name: N/A  . Number of children: N/A  . Years of education: N/A   Occupational History  . Not on file.   Social History Main Topics  . Smoking status: Never Smoker  . Smokeless tobacco: Never Used    . Alcohol use No  . Drug use: No  . Sexual activity: No   Other Topics Concern  . Not on file   Social History Narrative   Diet?       Do you drink/eat things with caffeine? occasional diet soda      Marital status?   Widowed/divorced                                 What year were you married? [place] you live in a house, apartment, assisted living, condo, trailer, etc.? home      Is it one or more stories? More (mainly living on one floor)      How many persons live in your home?      Do you have any pets in your home? (please list) no      Current or past profession: teacher      Do you exercise?       Physical therapy                              Type & how often? 2-3 per week      Do  you have a living will? yes      Do you have a DNR form?      ?                             If not, do you want to discuss one?      Do you have signed POA/HPOA for forms? yes        reports that she has never smoked. She has never used smokeless tobacco. She reports that she does not drink alcohol or use drugs.  Family History  Problem Relation Age of Onset  . Heart attack Mother   . COPD Sister   . Stroke Sister   . Bipolar disorder Daughter    Family Status  Relation Status  . Mother Deceased  . Father Deceased  . Sister Deceased  . Brother Deceased  . Sister Deceased  . Brother Deceased       murdered  . Daughter Alive  . Son Alive  . Son Alive     No Known Allergies  Medications: Patient's Medications  New Prescriptions   No medications on file  Previous Medications   ACETAMINOPHEN (TYLENOL) 500 MG TABLET    Take 500 mg by mouth every 6 (six) hours as needed.   ATORVASTATIN (LIPITOR) 20 MG TABLET    Take 1 tablet (20 mg total) by mouth daily at 6 PM.   B COMPLEX VITAMINS (VITAMIN B COMPLEX PO)    Take 1 tablet by mouth daily.    CHOLECALCIFEROL (VITAMIN D) 1000 UNITS TABLET    Take 5,000 Units by mouth daily.    CLONIDINE (CATAPRES) 0.1 MG TABLET     Take 1 tablet (0.1 mg total) by mouth 2 (two) times daily.   DICLOFENAC SODIUM (VOLTAREN) 1 % GEL    Apply topically 3 (three) times daily.   GABAPENTIN (NEURONTIN) 300 MG CAPSULE    Take 1 capsule (300 mg total) by mouth 3 (three) times daily.   GLUCOSE BLOOD (ACCU-CHEK AVIVA PLUS) TEST STRIP    1 each by Other route 2 (two) times daily. E11.9   LOSARTAN (COZAAR) 25 MG TABLET    Take 1 tablet (25 mg total) by mouth daily.   METFORMIN (GLUCOPHAGE) 1000 MG TABLET    Take 1 tablet (1,000 mg total) by mouth daily with breakfast. Take 1/2 tablet by mouth in the evening to control blood sugar   OMEGA 3 1200 MG CAPS    Take 1,200 mg by mouth daily.    OVER THE COUNTER MEDICATION    Take 1 capsule by mouth daily. "ultra flore balance"   RIVAROXABAN (XARELTO) 15 MG TABS TABLET    Take 1 tablet (15 mg total) by mouth daily with supper.  Modified Medications   No medications on file  Discontinued Medications   No medications on file    Review of Systems  Unable to perform ROS: Dementia    Vitals:   09/28/16 1423  BP: (!) 154/78  Pulse: 85  Temp: 98.7 F (37.1 C)  TempSrc: Oral  SpO2: 97%   There is no height or weight on file to calculate BMI.  Physical Exam  Constitutional: She appears well-developed.  Frail appearing in NAD; sitting in w/c  HENT:  Mouth/Throat: Oropharynx is clear and moist. No oropharyngeal exudate.  Eyes: Pupils are equal, round, and reactive to light. Right eye exhibits discharge. Left eye exhibits discharge. No scleral icterus.  B/l  clear d/c with conjunctival injection/cobblestoning; corneal redness OU  Neck: Neck supple. Carotid bruit is not present. No tracheal deviation present. No thyromegaly present.  Cardiovascular: Normal rate, regular rhythm and intact distal pulses.  Exam reveals no gallop and no friction rub.   Murmur (1/6 SEM) heard. +1 pitting LLE edema; trace RLE edema; no calf TTP  Pulmonary/Chest: Effort normal and breath sounds normal. No  stridor. No respiratory distress. She has no wheezes. She has no rales.  Abdominal: Soft. Bowel sounds are normal. She exhibits no distension and no mass. There is no hepatomegaly. There is no tenderness. There is no rebound and no guarding.  Musculoskeletal: She exhibits edema and deformity.  Lymphadenopathy:    She has no cervical adenopathy.  Neurological: She is alert.  Skin: Skin is warm and dry. No rash noted.  Psychiatric: She has a normal mood and affect. Her behavior is normal. Thought content normal.     Labs reviewed: Orders Only on 09/26/2016  Component Date Value Ref Range Status  . Sodium 09/26/2016 140  135 - 146 mmol/L Final  . Potassium 09/26/2016 4.5  3.5 - 5.3 mmol/L Final  . Chloride 09/26/2016 102  98 - 110 mmol/L Final  . CO2 09/26/2016 26  20 - 31 mmol/L Final  . Glucose, Bld 09/26/2016 138* 65 - 99 mg/dL Final  . BUN 09/26/2016 11  7 - 25 mg/dL Final  . Creat 09/26/2016 0.53* 0.60 - 0.93 mg/dL Final   Comment:   For patients > or = 79 years of age: The upper reference limit for Creatinine is approximately 13% higher for people identified as African-American.     . Calcium 09/26/2016 9.4  8.6 - 10.4 mg/dL Final  . GFR, Est African American 09/26/2016 >89  >=60 mL/min Final  . GFR, Est Non African American 09/26/2016 >89  >=60 mL/min Final  . Cholesterol 09/26/2016 155  <200 mg/dL Final  . Triglycerides 09/26/2016 62  <150 mg/dL Final  . HDL 09/26/2016 53  >50 mg/dL Final  . Total CHOL/HDL Ratio 09/26/2016 2.9  <5.0 Ratio Final  . VLDL 09/26/2016 12  <30 mg/dL Final  . LDL Cholesterol 09/26/2016 90  <100 mg/dL Final  . TSH 09/26/2016 3.37  mIU/L Final   Comment:   Reference Range   > or = 20 Years  0.40-4.50   Pregnancy Range First trimester  0.26-2.66 Second trimester 0.55-2.73 Third trimester  0.43-2.91     . Free T4 09/26/2016 1.1  0.8 - 1.8 ng/dL Final  Office Visit on 09/01/2016  Component Date Value Ref Range Status  . WBC 09/01/2016 5.2   3.8 - 10.8 K/uL Final  . RBC 09/01/2016 3.74* 3.80 - 5.10 MIL/uL Final  . Hemoglobin 09/01/2016 10.0* 11.7 - 15.5 g/dL Final  . HCT 09/01/2016 33.1* 35.0 - 45.0 % Final  . MCV 09/01/2016 88.5  80.0 - 100.0 fL Final  . MCH 09/01/2016 26.7* 27.0 - 33.0 pg Final  . MCHC 09/01/2016 30.2* 32.0 - 36.0 g/dL Final  . RDW 09/01/2016 13.4  11.0 - 15.0 % Final  . Platelets 09/01/2016 347  140 - 400 K/uL Final  . MPV 09/01/2016 8.9  7.5 - 12.5 fL Final  . Neutro Abs 09/01/2016 3172  1,500 - 7,800 cells/uL Final  . Lymphs Abs 09/01/2016 1508  850 - 3,900 cells/uL Final  . Monocytes Absolute 09/01/2016 468  200 - 950 cells/uL Final  . Eosinophils Absolute 09/01/2016 52  15 - 500 cells/uL Final  . Basophils Absolute  09/01/2016 0  0 - 200 cells/uL Final  . Neutrophils Relative % 09/01/2016 61  % Final  . Lymphocytes Relative 09/01/2016 29  % Final  . Monocytes Relative 09/01/2016 9  % Final  . Eosinophils Relative 09/01/2016 1  % Final  . Basophils Relative 09/01/2016 0  % Final  . Smear Review 09/01/2016 Criteria for review not met   Final  . Sodium 09/01/2016 137  135 - 146 mmol/L Final  . Potassium 09/01/2016 4.7  3.5 - 5.3 mmol/L Final  . Chloride 09/01/2016 98  98 - 110 mmol/L Final  . CO2 09/01/2016 27  20 - 31 mmol/L Final  . Glucose, Bld 09/01/2016 288* 65 - 99 mg/dL Final  . BUN 09/01/2016 12  7 - 25 mg/dL Final  . Creat 09/01/2016 0.59* 0.60 - 0.93 mg/dL Final   Comment:   For patients > or = 79 years of age: The upper reference limit for Creatinine is approximately 13% higher for people identified as African-American.     . Calcium 09/01/2016 9.2  8.6 - 10.4 mg/dL Final  Admission on 08/23/2016, Discharged on 08/26/2016  Component Date Value Ref Range Status  . WBC 08/23/2016 9.3  4.0 - 10.5 K/uL Final  . RBC 08/23/2016 4.20  3.87 - 5.11 MIL/uL Final  . Hemoglobin 08/23/2016 11.7* 12.0 - 15.0 g/dL Final  . HCT 08/23/2016 35.6* 36.0 - 46.0 % Final  . MCV 08/23/2016 84.8  78.0 -  100.0 fL Final  . MCH 08/23/2016 27.9  26.0 - 34.0 pg Final  . MCHC 08/23/2016 32.9  30.0 - 36.0 g/dL Final  . RDW 08/23/2016 13.0  11.5 - 15.5 % Final  . Platelets 08/23/2016 265  150 - 400 K/uL Final  . Neutrophils Relative % 08/23/2016 76  % Final  . Neutro Abs 08/23/2016 7.1  1.7 - 7.7 K/uL Final  . Lymphocytes Relative 08/23/2016 12  % Final  . Lymphs Abs 08/23/2016 1.1  0.7 - 4.0 K/uL Final  . Monocytes Relative 08/23/2016 12  % Final  . Monocytes Absolute 08/23/2016 1.1* 0.1 - 1.0 K/uL Final  . Eosinophils Relative 08/23/2016 0  % Final  . Eosinophils Absolute 08/23/2016 0.0  0.0 - 0.7 K/uL Final  . Basophils Relative 08/23/2016 0  % Final  . Basophils Absolute 08/23/2016 0.0  0.0 - 0.1 K/uL Final  . Sodium 08/23/2016 133* 135 - 145 mmol/L Final  . Potassium 08/23/2016 4.0  3.5 - 5.1 mmol/L Final  . Chloride 08/23/2016 95* 101 - 111 mmol/L Final  . CO2 08/23/2016 27  22 - 32 mmol/L Final  . Glucose, Bld 08/23/2016 242* 65 - 99 mg/dL Final  . BUN 08/23/2016 11  6 - 20 mg/dL Final  . Creatinine, Ser 08/23/2016 0.54  0.44 - 1.00 mg/dL Final  . Calcium 08/23/2016 9.2  8.9 - 10.3 mg/dL Final  . Total Protein 08/23/2016 7.7  6.5 - 8.1 g/dL Final  . Albumin 08/23/2016 3.5  3.5 - 5.0 g/dL Final  . AST 08/23/2016 20  15 - 41 U/L Final  . ALT 08/23/2016 15  14 - 54 U/L Final  . Alkaline Phosphatase 08/23/2016 65  38 - 126 U/L Final  . Total Bilirubin 08/23/2016 0.7  0.3 - 1.2 mg/dL Final  . GFR calc non Af Amer 08/23/2016 >60  >60 mL/min Final  . GFR calc Af Amer 08/23/2016 >60  >60 mL/min Final   Comment: (NOTE) The eGFR has been calculated using the CKD EPI equation. This calculation  has not been validated in all clinical situations. eGFR's persistently <60 mL/min signify possible Chronic Kidney Disease.   . Anion gap 08/23/2016 11  5 - 15 Final  . Lactic Acid, Venous 08/23/2016 2.47* 0.5 - 1.9 mmol/L Final  . Comment 08/23/2016 NOTIFIED PHYSICIAN   Final  . Color, Urine  08/23/2016 YELLOW  YELLOW Final  . APPearance 08/23/2016 HAZY* CLEAR Final  . Specific Gravity, Urine 08/23/2016 1.012  1.005 - 1.030 Final  . pH 08/23/2016 8.0  5.0 - 8.0 Final  . Glucose, UA 08/23/2016 150* NEGATIVE mg/dL Final  . Hgb urine dipstick 08/23/2016 SMALL* NEGATIVE Final  . Bilirubin Urine 08/23/2016 NEGATIVE  NEGATIVE Final  . Ketones, ur 08/23/2016 5* NEGATIVE mg/dL Final  . Protein, ur 08/23/2016 30* NEGATIVE mg/dL Final  . Nitrite 08/23/2016 POSITIVE* NEGATIVE Final  . Leukocytes, UA 08/23/2016 TRACE* NEGATIVE Final  . RBC / HPF 08/23/2016 0-5  0 - 5 RBC/hpf Final  . WBC, UA 08/23/2016 6-30  0 - 5 WBC/hpf Final  . Bacteria, UA 08/23/2016 FEW* NONE SEEN Final  . Squamous Epithelial / LPF 08/23/2016 0-5* NONE SEEN Final  . Mucous 08/23/2016 PRESENT   Final  . Specimen Description 08/23/2016 URINE, RANDOM   Final  . Special Requests 08/23/2016 NONE   Final  . Culture 08/23/2016 >=100,000 COLONIES/mL ESCHERICHIA COLI*  Final  . Report Status 08/23/2016 08/26/2016 FINAL   Final  . Organism ID, Bacteria 08/23/2016 ESCHERICHIA COLI*  Final  . Prothrombin Time 08/23/2016 14.8  11.4 - 15.2 seconds Final  . INR 08/23/2016 1.15   Final  . Troponin I 08/23/2016 <0.03  <0.03 ng/mL Final  . Lactic Acid, Venous 08/23/2016 2.0* 0.5 - 1.9 mmol/L Final   Comment: CRITICAL RESULT CALLED TO, READ BACK BY AND VERIFIED WITH: K.RIDGE RN AT 1846 ON 08/23/16 BY S.VANHOORNE   . Specimen Description 08/23/2016 BLOOD BLOOD RIGHT FOREARM   Final  . Special Requests 08/23/2016 BOTTLES DRAWN AEROBIC AND ANAEROBIC Blood Culture adequate volume   Final  . Culture  Setup Time 08/23/2016    Final                   Value:GRAM POSITIVE COCCI IN CLUSTERS AEROBIC BOTTLE ONLY CRITICAL RESULT CALLED TO, READ BACK BY AND VERIFIED WITH: E GREEN PHARMD [id number] 08/24/16 A BROWNING   . Culture 08/23/2016 *  Final                   Value:STAPHYLOCOCCUS EPIDERMIDIS THE SIGNIFICANCE OF ISOLATING THIS ORGANISM FROM A  SINGLE SET OF BLOOD CULTURES WHEN MULTIPLE SETS ARE DRAWN IS UNCERTAIN. PLEASE NOTIFY THE MICROBIOLOGY DEPARTMENT WITHIN ONE WEEK IF SPECIATION AND SENSITIVITIES ARE REQUIRED. Performed at [facility], [place] [address] [place], [place] [address]   . Report Status 08/23/2016 08/26/2016 FINAL   Final  . Specimen Description 08/23/2016 BLOOD LEFT ANTECUBITAL   Final  . Special Requests 08/23/2016 BOTTLES DRAWN AEROBIC AND ANAEROBIC Blood Culture adequate volume   Final  . Culture 08/23/2016    Final                   Value:NO GROWTH 5 DAYS Performed at [facility], [place] [address], [place], [place] [address]   . Report Status 08/23/2016 08/28/2016 FINAL   Final  . Total CK 08/23/2016 49  38 - 234 U/L Final  . Sodium 08/24/2016 132* 135 - 145 mmol/L Final  . Potassium 08/24/2016 3.5  3.5 - 5.1 mmol/L Final  .  Chloride 08/24/2016 99* 101 - 111 mmol/L Final  . CO2 08/24/2016 24  22 - 32 mmol/L Final  . Glucose, Bld 08/24/2016 250* 65 - 99 mg/dL Final  . BUN 08/24/2016 9  6 - 20 mg/dL Final  . Creatinine, Ser 08/24/2016 0.40* 0.44 - 1.00 mg/dL Final  . Calcium 08/24/2016 8.4* 8.9 - 10.3 mg/dL Final  . GFR calc non Af Amer 08/24/2016 >60  >60 mL/min Final  . GFR calc Af Amer 08/24/2016 >60  >60 mL/min Final   Comment: (NOTE) The eGFR has been calculated using the CKD EPI equation. This calculation has not been validated in all clinical situations. eGFR's persistently <60 mL/min signify possible Chronic Kidney Disease.   . Anion gap 08/24/2016 9  5 - 15 Final  . WBC 08/24/2016 9.6  4.0 - 10.5 K/uL Final  . RBC 08/24/2016 3.84* 3.87 - 5.11 MIL/uL Final  . Hemoglobin 08/24/2016 10.5* 12.0 - 15.0 g/dL Final  . HCT 08/24/2016 32.9* 36.0 - 46.0 % Final  . MCV 08/24/2016 85.7  78.0 - 100.0 fL Final  . MCH 08/24/2016 27.3  26.0 - 34.0 pg Final  . MCHC 08/24/2016 31.9  30.0 - 36.0 g/dL Final  . RDW 08/24/2016 13.1  11.5 - 15.5 % Final  . Platelets 08/24/2016 218  150 - 400 K/uL Final    . Lactic Acid, Venous 08/24/2016 1.9  0.5 - 1.9 mmol/L Final  . Lactic Acid, Venous 08/24/2016 1.1  0.5 - 1.9 mmol/L Final  . Procalcitonin 08/24/2016 <0.10  ng/mL Final   Comment:        Interpretation: PCT (Procalcitonin) <= 0.5 ng/mL: Systemic infection (sepsis) is not likely. Local bacterial infection is possible. (NOTE)         ICU PCT Algorithm               Non ICU PCT Algorithm    ----------------------------     ------------------------------         PCT < 0.25 ng/mL                 PCT < 0.1 ng/mL     Stopping of antibiotics            Stopping of antibiotics       strongly encouraged.               strongly encouraged.    ----------------------------     ------------------------------       PCT level decrease by               PCT < 0.25 ng/mL       >= 80% from peak PCT       OR PCT 0.25 - 0.5 ng/mL          Stopping of antibiotics                                             encouraged.     Stopping of antibiotics           encouraged.    ----------------------------     ------------------------------       PCT level decrease by              PCT >= 0.25 ng/mL       < 80% from peak PCT        AND PCT >= 0.5  ng/mL            Continuin                          g antibiotics                                              encouraged.       Continuing antibiotics            encouraged.    ----------------------------     ------------------------------     PCT level increase compared          PCT > 0.5 ng/mL         with peak PCT AND          PCT >= 0.5 ng/mL             Escalation of antibiotics                                          strongly encouraged.      Escalation of antibiotics        strongly encouraged.   . Glucose-Capillary 08/24/2016 197* 65 - 99 mg/dL Final  . Comment 1 08/24/2016 Notify RN   Final  . Comment 2 08/24/2016 Document in Chart   Final  . Glucose-Capillary 08/24/2016 258* 65 - 99 mg/dL Final  . Sodium 08/25/2016 136  135 - 145 mmol/L Final  .  Potassium 08/25/2016 3.5  3.5 - 5.1 mmol/L Final  . Chloride 08/25/2016 102  101 - 111 mmol/L Final  . CO2 08/25/2016 26  22 - 32 mmol/L Final  . Glucose, Bld 08/25/2016 214* 65 - 99 mg/dL Final  . BUN 08/25/2016 9  6 - 20 mg/dL Final  . Creatinine, Ser 08/25/2016 0.44  0.44 - 1.00 mg/dL Final  . Calcium 08/25/2016 8.2* 8.9 - 10.3 mg/dL Final  . GFR calc non Af Amer 08/25/2016 >60  >60 mL/min Final  . GFR calc Af Amer 08/25/2016 >60  >60 mL/min Final   Comment: (NOTE) The eGFR has been calculated using the CKD EPI equation. This calculation has not been validated in all clinical situations. eGFR's persistently <60 mL/min signify possible Chronic Kidney Disease.   . Anion gap 08/25/2016 8  5 - 15 Final  . WBC 08/25/2016 6.8  4.0 - 10.5 K/uL Final  . RBC 08/25/2016 3.46* 3.87 - 5.11 MIL/uL Final  . Hemoglobin 08/25/2016 9.3* 12.0 - 15.0 g/dL Final  . HCT 08/25/2016 29.2* 36.0 - 46.0 % Final  . MCV 08/25/2016 84.4  78.0 - 100.0 fL Final  . MCH 08/25/2016 26.9  26.0 - 34.0 pg Final  . MCHC 08/25/2016 31.8  30.0 - 36.0 g/dL Final  . RDW 08/25/2016 12.9  11.5 - 15.5 % Final  . Platelets 08/25/2016 229  150 - 400 K/uL Final  . Glucose-Capillary 08/24/2016 198* 65 - 99 mg/dL Final  . Comment 1 08/24/2016 Notify RN   Final  . Comment 2 08/24/2016 Document in Chart   Final  . Glucose-Capillary 08/24/2016 187* 65 - 99 mg/dL Final  . Comment 1 08/24/2016 Notify RN   Final  . Enterococcus species 08/23/2016 NOT DETECTED  NOT DETECTED Final  . Listeria monocytogenes 08/23/2016  NOT DETECTED  NOT DETECTED Final  . Staphylococcus species 08/23/2016 DETECTED* NOT DETECTED Final   Comment: Methicillin (oxacillin) resistant coagulase negative staphylococcus. Possible blood culture contaminant (unless isolated from more than one blood culture draw or clinical case suggests pathogenicity). No antibiotic treatment is indicated for blood  culture contaminants. CRITICAL RESULT CALLED TO, READ BACK BY AND  VERIFIED WITH: E GREEN PHARMD 2237 08/24/16 A BROWNING   . Staphylococcus aureus 08/23/2016 NOT DETECTED  NOT DETECTED Final  . Methicillin resistance 08/23/2016 DETECTED* NOT DETECTED Final   Comment: CRITICAL RESULT CALLED TO, READ BACK BY AND VERIFIED WITH: E GREEN PHARMD 2237 08/24/16 A BROWNING   . Streptococcus species 08/23/2016 NOT DETECTED  NOT DETECTED Final  . Streptococcus agalactiae 08/23/2016 NOT DETECTED  NOT DETECTED Final  . Streptococcus pneumoniae 08/23/2016 NOT DETECTED  NOT DETECTED Final  . Streptococcus pyogenes 08/23/2016 NOT DETECTED  NOT DETECTED Final  . Acinetobacter baumannii 08/23/2016 NOT DETECTED  NOT DETECTED Final  . Enterobacteriaceae species 08/23/2016 NOT DETECTED  NOT DETECTED Final  . Enterobacter cloacae complex 08/23/2016 NOT DETECTED  NOT DETECTED Final  . Escherichia coli 08/23/2016 NOT DETECTED  NOT DETECTED Final  . Klebsiella oxytoca 08/23/2016 NOT DETECTED  NOT DETECTED Final  . Klebsiella pneumoniae 08/23/2016 NOT DETECTED  NOT DETECTED Final  . Proteus species 08/23/2016 NOT DETECTED  NOT DETECTED Final  . Serratia marcescens 08/23/2016 NOT DETECTED  NOT DETECTED Final  . Haemophilus influenzae 08/23/2016 NOT DETECTED  NOT DETECTED Final  . Neisseria meningitidis 08/23/2016 NOT DETECTED  NOT DETECTED Final  . Pseudomonas aeruginosa 08/23/2016 NOT DETECTED  NOT DETECTED Final  . Candida albicans 08/23/2016 NOT DETECTED  NOT DETECTED Final  . Candida glabrata 08/23/2016 NOT DETECTED  NOT DETECTED Final  . Candida krusei 08/23/2016 NOT DETECTED  NOT DETECTED Final  . Candida parapsilosis 08/23/2016 NOT DETECTED  NOT DETECTED Final  . Candida tropicalis 08/23/2016 NOT DETECTED  NOT DETECTED Final   Performed at [facility], [address], [place], [place] [address]  . Vitamin B-12 08/25/2016 344  180 - 914 pg/mL Final   Comment: (NOTE) This assay is not validated for testing neonatal or myeloproliferative syndrome specimens for  Vitamin B12 levels. Performed at [facility], [place] [address], [place], [place] [address]   . TSH 08/25/2016 1.986  0.350 - 4.500 uIU/mL Final   Performed by a 3rd Generation assay with a functional sensitivity of <=0.01 uIU/mL.  Marland Kitchen RPR Ser Ql 08/25/2016 Non Reactive  Non Reactive Final   Comment: (NOTE) Performed At: [facility] [place], [place] [id number] Lindon Romp MD QR:[id number]   . Glucose-Capillary 08/25/2016 177* 65 - 99 mg/dL Final  . Comment 1 08/25/2016 Notify RN   Final  . Comment 2 08/25/2016 Document in Chart   Final  . Specimen Description 08/25/2016 BLOOD LEFT HAND   Final  . Special Requests 08/25/2016 IN PEDIATRIC BOTTLE Blood Culture adequate volume   Final  . Culture 08/25/2016    Final                   Value:NO GROWTH 5 DAYS Performed at [facility], [place] [address] [place], [place] [address]   . Report Status 08/25/2016 08/30/2016 FINAL   Final  . Specimen Description 08/25/2016 BLOOD LEFT ARM   Final  . Special Requests 08/25/2016 IN PEDIATRIC BOTTLE Blood Culture adequate volume   Final  . Culture 08/25/2016    Final  Value:NO GROWTH 5 DAYS Performed at [facility], [place] [address], [place], [place] [address]   . Report Status 08/25/2016 08/30/2016 FINAL   Final  . Glucose-Capillary 08/25/2016 258* 65 - 99 mg/dL Final  . Glucose-Capillary 08/25/2016 213* 65 - 99 mg/dL Final  . Comment 1 08/25/2016 Notify RN   Final  . Comment 2 08/25/2016 Document in Chart   Final  . Glucose-Capillary 08/25/2016 248* 65 - 99 mg/dL Final  . Comment 1 08/25/2016 Notify RN   Final  . Glucose-Capillary 08/26/2016 209* 65 - 99 mg/dL Final  . Comment 1 08/26/2016 Notify RN   Final  . Comment 2 08/26/2016 Document in Chart   Final  . Glucose-Capillary 08/26/2016 264* 65 - 99 mg/dL Final  . Comment 1 08/26/2016 Notify RN   Final  . Comment 2 08/26/2016 Document in Chart   Final  . Glucose-Capillary 08/26/2016  184* 65 - 99 mg/dL Final  . Comment 1 08/26/2016 Notify RN   Final  . Comment 2 08/26/2016 Document in Chart   Final  Appointment on 07/13/2016  Component Date Value Ref Range Status  . WBC 07/13/2016 5.6  3.9 - 10.0 10e3/uL Final  . RBC 07/13/2016 4.01  3.70 - 5.32 10e6/uL Final  . HGB 07/13/2016 11.5* 11.6 - 15.9 g/dL Final  . HCT 07/13/2016 36.3  34.8 - 46.6 % Final  . MCV 07/13/2016 91  81 - 101 fL Final  . MCH 07/13/2016 28.7  26.0 - 34.0 pg Final  . MCHC 07/13/2016 31.7* 32.0 - 36.0 g/dL Final  . RDW 07/13/2016 12.4  11.1 - 15.7 % Final  . Platelets 07/13/2016 238  145 - 400 10e3/uL Final  . NEUT# 07/13/2016 3.5  1.5 - 6.5 10e3/uL Final  . LYMPH# 07/13/2016 1.5  0.9 - 3.3 10e3/uL Final  . MONO# 07/13/2016 0.6  0.1 - 0.9 10e3/uL Final  . Eosinophils Absolute 07/13/2016 0.0  0.0 - 0.5 10e3/uL Final  . BASO# 07/13/2016 0.0  0.0 - 0.2 10e3/uL Final  . NEUT% 07/13/2016 62.9  39.6 - 80.0 % Final  . LYMPH% 07/13/2016 26.0  14.0 - 48.0 % Final  . MONO% 07/13/2016 10.4  0.0 - 13.0 % Final  . EOS% 07/13/2016 0.5  0.0 - 7.0 % Final  . BASO% 07/13/2016 0.2  0.0 - 2.0 % Final  . D-DIMER 07/13/2016 0.52* 0.00 - 0.49 mg/L FEU Final   Comment: According to the assay manufacturer's published package insert, a normal (<0.50 mg/L FEU) D-dimer result in conjunction with a non-high clinical probability assessment, excludes deep vein thrombosis (DVT) and pulmonary embolism (PE) with high sensitivity. D-dimer values increase with age and this can make VTE exclusion of an older population difficult. To address this, the [place], based on best available evidence and recent guidelines, recommends that clinicians use age-adjusted D-dimer thresholds in patients greater than 17 years of age with: a) a low probability of PE who do not meet all Pulmonary Embolism Rule Out Criteria, or b) in those with intermediate probability of PE. The formula for an age-adjusted D-dimer cut-off is  "age/100". For example, a 79 year old patient would have an age-adjusted cut-off of 0.60 mg/L FEU and an 79 year old 0.80 mg/L FEU.   Marland Kitchen Glucose 07/13/2016 189* 65 - 99 mg/dL Final  . BUN 07/13/2016 24  8 - 27 mg/dL Final  . Creatinine, Ser 07/13/2016 0.60  0.57 - 1.00 mg/dL Final  . GFR calc non Af Wyvonnia Lora 07/13/2016  88  >59 mL/min/1.73 Final  . GFR calc Af Amer 07/13/2016 101  >59 mL/min/1.73 Final  . BUN/Creatinine Ratio 07/13/2016 40* 12 - 28 Final  . Sodium 07/13/2016 133* 134 - 144 mmol/L Final  . Potassium, Ser 07/13/2016 4.3  3.5 - 5.2 mmol/L Final  . Chloride, Ser 07/13/2016 98  96 - 106 mmol/L Final  . Carbon Dioxide, Total 07/13/2016 25  18 - 29 mmol/L Final  . Calcium, Ser 07/13/2016 9.9  8.7 - 10.3 mg/dL Final  . Total Protein 07/13/2016 6.9  6.0 - 8.5 g/dL Final  . Albumin, Serum 07/13/2016 3.6  3.5 - 4.8 g/dL Final  . Globulin, Total 07/13/2016 3.3  1.5 - 4.5 g/dL Final  . Albumin/Globulin Ratio 07/13/2016 1.1* 1.2 - 2.2 Final  . Bilirubin Total 07/13/2016 <0.2  0.0 - 1.2 mg/dL Final   **Result Repeated**  . Alkaline Phosphatase, S 07/13/2016 77  39 - 117 IU/L Final  . AST (SGOT) 07/13/2016 12  0 - 40 IU/L Final  . ALT 07/13/2016 11  0 - 32 IU/L Final  Abstract on 07/07/2016  Component Date Value Ref Range Status  . Hemoglobin 07/05/2016 11.8* 12.0 - 16.0 g/dL Final  . HCT 07/05/2016 38  36 - 46 % Final  . Neutrophils Absolute 07/05/2016 5  /L Final  . Platelets 07/05/2016 248  150 - 399 K/L Final  . WBC 07/05/2016 7.4  10^3/mL Final  Office Visit on 07/04/2016  Component Date Value Ref Range Status  . Sodium 07/04/2016 138  135 - 146 mmol/L Final  . Potassium 07/04/2016 4.3  3.5 - 5.3 mmol/L Final  . Chloride 07/04/2016 99  98 - 110 mmol/L Final  . CO2 07/04/2016 23  20 - 31 mmol/L Final  . Glucose, Bld 07/04/2016 253* 65 - 99 mg/dL Final  . BUN 07/04/2016 22  7 - 25 mg/dL Final  . Creat 07/04/2016 0.71  0.60 - 0.93 mg/dL Final   Comment:   For patients > or =  79 years of age: The upper reference limit for Creatinine is approximately 13% higher for people identified as African-American.     . Calcium 07/04/2016 9.3  8.6 - 10.4 mg/dL Final  . WBC 07/04/2016 CANCELED  3.8 - 10.8 K/uL Final   Comment: Test not performed, no Lavender was received.    Result canceled by the ancillary   . RBC 07/04/2016 CANCELED  3.80 - 5.10 MIL/uL Final   Result canceled by the ancillary  . Hemoglobin 07/04/2016 CANCELED  11.7 - 15.5 g/dL Final   Result canceled by the ancillary  . HCT 07/04/2016 CANCELED  35.0 - 45.0 % Final   Result canceled by the ancillary  . MCV 07/04/2016 CANCELED  80.0 - 100.0 fL Final   Result canceled by the ancillary  . [facility] 07/04/2016 CANCELED  27.0 - 33.0 pg Final   Result canceled by the ancillary  . MCHC 07/04/2016 CANCELED  32.0 - 36.0 g/dL Final   Result canceled by the ancillary  . RDW 07/04/2016 CANCELED  11.0 - 15.0 % Final   Result canceled by the ancillary  . Platelets 07/04/2016 CANCELED  140 - 400 K/uL Final   Result canceled by the ancillary  . MPV 07/04/2016 CANCELED  7.5 - 12.5 fL Final   Result canceled by the ancillary  . Neutro Abs 07/04/2016 CANCELED  1500 - 7800 cells/uL Final   Result canceled by the ancillary  . Lymphs Abs 07/04/2016 CANCELED  850 - 3900 cells/uL Final  Result canceled by the ancillary  . Monocytes Absolute 07/04/2016 CANCELED  200 - 950 cells/uL Final   Result canceled by the ancillary  . Eosinophils Absolute 07/04/2016 CANCELED  15 - 500 cells/uL Final   Result canceled by the ancillary  . Basophils Absolute 07/04/2016 CANCELED  0 - 200 cells/uL Final   Result canceled by the ancillary  . Neutrophils Relative % 07/04/2016 CANCELED  % Final   Result canceled by the ancillary  . Lymphocytes Relative 07/04/2016 CANCELED  % Final   Result canceled by the ancillary  . Monocytes Relative 07/04/2016 CANCELED  % Final   Result canceled by the ancillary  . Eosinophils Relative 07/04/2016  CANCELED  % Final   Result canceled by the ancillary  . Basophils Relative 07/04/2016 CANCELED  % Final   Result canceled by the ancillary  . Smear Review 07/04/2016 CANCELED   Final   Result canceled by the ancillary    No results found.   Assessment/Plan   ICD-9-CM ICD-10-CM   1. Acute conjunctivitis of both eyes, unspecified acute conjunctivitis type 372.00 H10.33 ciprofloxacin (CILOXAN) 0.3 % ophthalmic solution   probable allergic  2. Primary osteoarthritis of left knee 715.16 M17.12   3. Type 2 diabetes mellitus with diabetic polyneuropathy, without long-term current use of insulin (HCC) 250.60 E11.42    357.2    4. History of DVT (deep vein thrombosis) V12.51 Z86.718   5. Lymphedema of left lower extremity 457.1 I89.0   6. Mixed hyperlipidemia 272.2 E78.2   7. Essential hypertension 401.9 I10    Use eye drops 1 drop in each eye 4 times daily x 7 days  Wash hands frequently especially when touching eyes  Continue other medications as ordered  Follow up with specialists as scheduled  follow up in 3 mos for DM, HTN, dementia  Angeldejesus Callaham S. Perlie Gold  [facility] [address] [place], [place] [address] [phone] Cell (Monday-Friday 8 AM - 5 PM) [phone] After 5 PM and follow prompts

## 2016-10-04 ENCOUNTER — Observation Stay (HOSPITAL_COMMUNITY)
Admission: EM | Admit: 2016-10-04 | Discharge: 2016-10-06 | Disposition: A | Discharge status: Home Health | Payer: Medicare Other | Attending: Family Medicine | Admitting: Family Medicine

## 2016-10-04 ENCOUNTER — Emergency Department (HOSPITAL_COMMUNITY): Payer: Medicare Other

## 2016-10-04 ENCOUNTER — Encounter (HOSPITAL_COMMUNITY): Payer: Self-pay

## 2016-10-04 DIAGNOSIS — R4701 Aphasia: Secondary | ICD-10-CM | POA: Diagnosis not present

## 2016-10-04 DIAGNOSIS — R29810 Facial weakness: Secondary | ICD-10-CM | POA: Diagnosis not present

## 2016-10-04 DIAGNOSIS — Z79899 Other long term (current) drug therapy: Secondary | ICD-10-CM | POA: Diagnosis not present

## 2016-10-04 DIAGNOSIS — E1142 Type 2 diabetes mellitus with diabetic polyneuropathy: Secondary | ICD-10-CM

## 2016-10-04 DIAGNOSIS — E785 Hyperlipidemia, unspecified: Secondary | ICD-10-CM | POA: Diagnosis not present

## 2016-10-04 DIAGNOSIS — Z993 Dependence on wheelchair: Secondary | ICD-10-CM | POA: Insufficient documentation

## 2016-10-04 DIAGNOSIS — F0390 Unspecified dementia without behavioral disturbance: Secondary | ICD-10-CM | POA: Diagnosis present

## 2016-10-04 DIAGNOSIS — G459 Transient cerebral ischemic attack, unspecified: Principal | ICD-10-CM | POA: Insufficient documentation

## 2016-10-04 DIAGNOSIS — Z86718 Personal history of other venous thrombosis and embolism: Secondary | ICD-10-CM | POA: Insufficient documentation

## 2016-10-04 DIAGNOSIS — I10 Essential (primary) hypertension: Secondary | ICD-10-CM | POA: Diagnosis not present

## 2016-10-04 DIAGNOSIS — Z86711 Personal history of pulmonary embolism: Secondary | ICD-10-CM | POA: Diagnosis not present

## 2016-10-04 DIAGNOSIS — R4781 Slurred speech: Secondary | ICD-10-CM | POA: Diagnosis present

## 2016-10-04 DIAGNOSIS — E119 Type 2 diabetes mellitus without complications: Secondary | ICD-10-CM | POA: Insufficient documentation

## 2016-10-04 LAB — BASIC METABOLIC PANEL
Anion gap: 11 (ref 5–15)
BUN: 18 mg/dL (ref 6–20)
CALCIUM: 9.6 mg/dL (ref 8.9–10.3)
CO2: 27 mmol/L (ref 22–32)
Chloride: 102 mmol/L (ref 101–111)
Creatinine, Ser: 0.52 mg/dL (ref 0.44–1.00)
GFR calc Af Amer: >60 mL/min (ref >60)
Glucose, Bld: 154 mg/dL — ABNORMAL HIGH (ref 65–99)
POTASSIUM: 4.5 mmol/L (ref 3.5–5.1)
SODIUM: 140 mmol/L (ref 135–145)

## 2016-10-04 LAB — CBC WITH DIFFERENTIAL/PLATELET
BASOS ABS: 0 10*3/uL (ref 0.0–0.1)
Basophils Relative: 0 %
EOS ABS: 0.1 10*3/uL (ref 0.0–0.7)
EOS PCT: 1 %
HCT: 37.2 % (ref 36.0–46.0)
Hemoglobin: 11.6 g/dL — ABNORMAL LOW (ref 12.0–15.0)
LYMPHS ABS: 2.3 10*3/uL (ref 0.7–4.0)
Lymphocytes Relative: 40 %
MCH: 27.5 pg (ref 26.0–34.0)
MCHC: 31.2 g/dL (ref 30.0–36.0)
MCV: 88.2 fL (ref 78.0–100.0)
Monocytes Absolute: 0.6 10*3/uL (ref 0.1–1.0)
Monocytes Relative: 11 %
Neutro Abs: 2.8 10*3/uL (ref 1.7–7.7)
Neutrophils Relative %: 48 %
PLATELETS: 222 10*3/uL (ref 150–400)
RBC: 4.22 MIL/uL (ref 3.87–5.11)
RDW: 13.9 % (ref 11.5–15.5)
WBC: 5.8 10*3/uL (ref 4.0–10.5)

## 2016-10-04 LAB — APTT: aPTT: 37 seconds — ABNORMAL HIGH (ref 24–36)

## 2016-10-04 LAB — I-STAT TROPONIN, ED: TROPONIN I, POC: 0.01 ng/mL (ref 0.00–0.08)

## 2016-10-04 LAB — I-STAT CHEM 8, ED
BUN: 17 mg/dL (ref 6–20)
CALCIUM ION: 1.16 mmol/L (ref 1.15–1.40)
CREATININE: 0.5 mg/dL (ref 0.44–1.00)
Chloride: 101 mmol/L (ref 101–111)
GLUCOSE: 149 mg/dL — AB (ref 65–99)
HEMATOCRIT: 37 % (ref 36.0–46.0)
HEMOGLOBIN: 12.6 g/dL (ref 12.0–15.0)
Potassium: 4.4 mmol/L (ref 3.5–5.1)
Sodium: 138 mmol/L (ref 135–145)
TCO2: 27 mmol/L (ref 0–100)

## 2016-10-04 LAB — ETHANOL: Alcohol, Ethyl (B): <5 mg/dL (ref <5)

## 2016-10-04 LAB — PROTIME-INR
INR: 1.43
Prothrombin Time: 17.5 seconds — ABNORMAL HIGH (ref 11.4–15.2)

## 2016-10-04 NOTE — ED Triage Notes (Addendum)
Pt's caregiver called daughter in law about facial drooping and slurred speech. [name] brought her in to be evaluated. Denies numbness on left side. Hx of stroke and slurred speech.

## 2016-10-04 NOTE — ED Provider Notes (Signed)
[facility] DEPT Provider Note   CSN: [id number] Arrival date & time: 10/04/16  2025     History   Chief Complaint Chief Complaint  Patient presents with  . Aphasia  . 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 female.  Patient is a 81 year old female with a history of dementia, diabetes, hypertension, hyperlipidemia, pulmonary embolus on Xarelto and prior stroke versus TIA in January of this year. She presents today with left-sided weakness and facial drooping. Her caretaker noticed at 720 this evening that she had a sudden onset of slurred speech and facial drooping with some left-sided arm weakness. This lasted about 2 hours and when she was here in the emergency room, the symptoms resolved. She had similar symptoms in January where she was admitted. She had an echo at that time showed an EF of 60-65%. She had carotid Dopplers which showed 1-39% stenosis of the ICAs bilaterally. She had an MRI which is negative for acute infarct although is limited due to patient movement. Otherwise the family [facility] the patient has been acting at baseline. She hasn't had any recent fevers vomiting or increased confusion. History is limited due to patient's dementia.      Past Medical History:  Diagnosis Date  . Dementia   . Diabetes mellitus type II 03/21/2011  . DVT (deep venous thrombosis) ([place]) 03/21/2011  . Hyperlipidemia 03/21/2011  . Hypertension 03/21/2011  . Lymphedema of leg 03/21/2011  . Pulmonary embolism ([place]) 03/21/2011    Patient Active Problem List   Diagnosis Date Noted  . Lower urinary tract infectious disease   . Current use of long term anticoagulation 08/24/2016  . Metabolic encephalopathy 95/01/3266  . Dementia 08/23/2016  . Sepsis ([place]) 08/23/2016  . Acute CVA (cerebrovascular accident) ([place])   . Left-sided weakness 05/11/2016  . Dysarthria 05/11/2016  . Acute left-sided weakness 05/11/2016  . History of pulmonary embolism 03/21/2011  . History of DVT  (deep vein thrombosis) 03/21/2011  . Diabetes mellitus type II, non insulin dependent ([place]) 03/21/2011  . Lymphedema of leg 03/21/2011  . Hyperlipidemia 03/21/2011  . Hypertension 03/21/2011    Past Surgical History:  Procedure Laterality Date  . CHOLECYSTECTOMY  2015  . SKIN TAG REMOVAL  07/25/2016  . VAGINAL HYSTERECTOMY     unknown of date  . VASCULAR SURGERY      OB History    Gravida Para Term Preterm AB Living   3 3 3     3    SAB TAB Ectopic Multiple Live Births           3       Home Medications    Prior to Admission medications   Medication Sig Start Date End Date Taking? Authorizing Provider  acetaminophen (TYLENOL) 500 MG tablet Take 500 mg by mouth every 6 (six) hours as needed.   Yes [provider]  atorvastatin (LIPITOR) 20 MG tablet Take 1 tablet (20 mg total) by mouth daily at 6 PM. 09/14/16  Yes Estill Dooms, MD  B Complex Vitamins (VITAMIN B COMPLEX PO) Take 1 tablet by mouth daily.    Yes [provider]  cholecalciferol (VITAMIN D) 1000 units tablet Take 5,000 Units by mouth daily.    Yes [provider]  ciprofloxacin (CILOXAN) 0.3 % ophthalmic solution Administer 1 drop QID x 7 days into both eyes 09/28/16  Yes Gildardo Cranker, DO  cloNIDine (CATAPRES) 0.1 MG tablet Take 1 tablet (0.1 mg total) by mouth 2 (two) times  daily. 09/14/16  Yes Estill Dooms, MD  diclofenac sodium (VOLTAREN) 1 % GEL Apply topically 3 (three) times daily.   Yes [provider]  gabapentin (NEURONTIN) 300 MG capsule Take 1 capsule (300 mg total) by mouth 3 (three) times daily. 09/14/16  Yes Estill Dooms, MD  glucose blood (ACCU-CHEK AVIVA PLUS) test strip 1 each by Other route 2 (two) times daily. E11.9 09/16/16  Yes Eulas Post, Monica, DO  losartan (COZAAR) 25 MG tablet Take 1 tablet (25 mg total) by mouth daily. 09/14/16  Yes Estill Dooms, MD  metFORMIN (GLUCOPHAGE) 1000 MG tablet Take 1 tablet (1,000 mg total) by mouth daily with breakfast. Take  1/2 tablet by mouth in the evening to control blood sugar 09/14/16  Yes Estill Dooms, MD  Omega 3 1200 MG CAPS Take 1,200 mg by mouth daily.    Yes [provider]  OVER THE COUNTER MEDICATION Take 1 capsule by mouth daily. "ultra flore balance"   Yes [provider]  Rivaroxaban (XARELTO) 15 MG TABS tablet Take 1 tablet (15 mg total) by mouth daily with supper. 09/14/16  Yes Estill Dooms, MD    Family History Family History  Problem Relation Age of Onset  . Heart attack Mother   . COPD Sister   . Stroke Sister   . Bipolar disorder Daughter     Social History Social History  Substance Use Topics  . Smoking status: Never Smoker  . Smokeless tobacco: Never Used  . Alcohol use No     Allergies   Patient has no known allergies.   Review of Systems Review of Systems  Unable to perform ROS: Dementia     Physical Exam Updated Vital Signs BP (!) 165/74 (BP Location: Right Arm)   Pulse 70   Temp 98.4 F (36.9 C) (Oral)   Resp 19   SpO2 100%   Physical Exam  Constitutional: She is oriented to person, place, and time. She appears well-developed and well-nourished.  HENT:  Head: Normocephalic and atraumatic.  Eyes: Pupils are equal, round, and reactive to light.  Neck: Normal range of motion. Neck supple.  Cardiovascular: Normal rate, regular rhythm and normal heart sounds.   Pulmonary/Chest: Effort normal and breath sounds normal. No respiratory distress. She has no wheezes. She has no rales. She exhibits no tenderness.  Abdominal: Soft. Bowel sounds are normal. There is no tenderness. There is no rebound and no guarding.  Musculoskeletal: Normal range of motion. She exhibits no edema.  Lymphadenopathy:    She has no cervical adenopathy.  Neurological: She is alert and oriented to person, place, and time.  Patient has no obvious facial drooping. No slurred speech. Normal shoulder shrug. Normal sensation to touch on the face. She has normal strength  bilaterally in her upper extremities. She has generalized weakness in her lower extremities bilaterally but this is baseline per family.  Skin: Skin is warm and dry. No rash noted.  Psychiatric: She has a normal mood and affect.     ED Treatments / Results  Labs (all labs ordered are listed, but only abnormal results are displayed) Labs Reviewed  CBC WITH DIFFERENTIAL/PLATELET - Abnormal; Notable for the following:       Result Value   Hemoglobin 11.6 (*)    All other components within normal limits  BASIC METABOLIC PANEL - Abnormal; Notable for the following:    Glucose, Bld 154 (*)    All other components within normal limits  PROTIME-INR -  Abnormal; Notable for the following:    Prothrombin Time 17.5 (*)    All other components within normal limits  APTT - Abnormal; Notable for the following:    aPTT 37 (*)    All other components within normal limits  I-STAT CHEM 8, ED - Abnormal; Notable for the following:    Glucose, Bld 149 (*)    All other components within normal limits  ETHANOL  RAPID URINE DRUG SCREEN, HOSP PERFORMED  URINALYSIS, ROUTINE W REFLEX MICROSCOPIC  I-STAT TROPOININ, ED    EKG  EKG Interpretation  Date/Time:  Tuesday Oct 04 2016 20:30:06 EDT Ventricular Rate:  80 PR Interval:    QRS Duration: 83 QT Interval:  369 QTC Calculation: 426 R Axis:   57 Text Interpretation:  Sinus rhythm Abnormal R-wave progression, early transition since last tracing no significant change Confirmed by Malvin Johns [phone]) on 10/04/2016 11:14:01 PM       Radiology Ct Head Wo Contrast  Result Date: 10/04/2016 CLINICAL DATA:  81 year old female with left-sided facial drooping and slurred speech. EXAM: CT HEAD WITHOUT CONTRAST TECHNIQUE: Contiguous axial images were obtained from the base of the skull through the vertex without intravenous contrast. COMPARISON:  Head CT dated 08/23/2016 FINDINGS: Brain: There is moderate age-related atrophy and chronic microvascular  ischemic changes. There is no acute intracranial hemorrhage. No mass effect or midline shift noted. Focal area of old infarct and encephalomalacia in the left cerebellar hemisphere similar to prior CT. Vascular: No hyperdense vessel or unexpected calcification. Skull: Normal. Negative for fracture or focal lesion. Sinuses/Orbits: No acute finding. Other: None IMPRESSION: 1. No acute intracranial hemorrhage. 2. Moderate age-related atrophy and chronic microvascular ischemic changes. 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 If symptoms persist, and there are no contraindications, MRI may provide better evaluation if clinically indicated. Electronically Signed   By: Anner Crete M.D.   On: 10/04/2016 22:38    Procedures Procedures (including critical care time)  Medications Ordered in ED Medications - No data to display   Initial Impression / Assessment and Plan / ED Course  I have reviewed the triage vital signs and the nursing notes.  Pertinent labs & imaging results that were available during my care of the patient were reviewed by me and considered in my medical decision making (see chart for details).     Patient presents with left-sided facial drooping and left-sided arm weakness. Her symptoms have resolved on my evaluation. Her CT scan is negative. Her labs are non-concerning.  I spoke with the neurologist on call, Dr. Nicole Kindred who requests patient be admitted to [facility]. I spoke with Dr.Niu who will admit the pt.  Final Clinical Impressions(s) / ED Diagnoses   Final diagnoses:  Transient cerebral ischemia, unspecified type    New Prescriptions New Prescriptions   No medications on file     Malvin Johns, MD 10/05/16 [id number]

## 2016-10-05 ENCOUNTER — Other Ambulatory Visit (HOSPITAL_COMMUNITY): Payer: Self-pay | Admitting: Radiology

## 2016-10-05 ENCOUNTER — Observation Stay (HOSPITAL_BASED_OUTPATIENT_CLINIC_OR_DEPARTMENT_OTHER): Payer: Medicare Other

## 2016-10-05 ENCOUNTER — Other Ambulatory Visit (HOSPITAL_COMMUNITY): Payer: Medicare Other

## 2016-10-05 ENCOUNTER — Observation Stay (HOSPITAL_COMMUNITY): Payer: Medicare Other

## 2016-10-05 DIAGNOSIS — E785 Hyperlipidemia, unspecified: Secondary | ICD-10-CM

## 2016-10-05 DIAGNOSIS — G458 Other transient cerebral ischemic attacks and related syndromes: Secondary | ICD-10-CM

## 2016-10-05 DIAGNOSIS — Z86718 Personal history of other venous thrombosis and embolism: Secondary | ICD-10-CM | POA: Diagnosis not present

## 2016-10-05 DIAGNOSIS — E119 Type 2 diabetes mellitus without complications: Secondary | ICD-10-CM

## 2016-10-05 DIAGNOSIS — Z86711 Personal history of pulmonary embolism: Secondary | ICD-10-CM

## 2016-10-05 DIAGNOSIS — I253 Aneurysm of heart: Secondary | ICD-10-CM | POA: Diagnosis not present

## 2016-10-05 DIAGNOSIS — R29810 Facial weakness: Secondary | ICD-10-CM | POA: Diagnosis not present

## 2016-10-05 DIAGNOSIS — G459 Transient cerebral ischemic attack, unspecified: Secondary | ICD-10-CM | POA: Diagnosis not present

## 2016-10-05 DIAGNOSIS — Q211 Atrial septal defect: Secondary | ICD-10-CM

## 2016-10-05 DIAGNOSIS — I10 Essential (primary) hypertension: Secondary | ICD-10-CM

## 2016-10-05 LAB — ECHOCARDIOGRAM COMPLETE
AOASC: 29 cm
CHL CUP MV DEC (S): 327
CHL CUP STROKE VOLUME: 29 mL
EERAT: 7.28
EWDT: 327 ms
FS: 27 % — AB (ref 28–44)
Height: 66 in
IV/PV OW: 1.04
LADIAMINDEX: 1.63 cm/m2
LASIZE: 27 mm
LAVOLA4C: 40.2 mL
LDCA: 2.27 cm2
LEFT ATRIUM END SYS DIAM: 27 mm
LV E/e' medial: 7.28
LV PW d: 16.7 mm — AB (ref 0.6–1.1)
LV SIMPSON'S DISK: 68
LV TDI E'MEDIAL: 6.6
LV sys vol: 13 mL — AB
LVDIAVOL: 42 mL — AB (ref 46–106)
LVDIAVOLIN: 26 mL/m2
LVEEAVG: 7.28
LVELAT: 8.04 cm/s
LVOT SV: 35 mL
LVOT VTI: 15.4 cm
LVOT diameter: 17 mm
LVOT peak vel: 74.2 cm/s
LVSYSVOLIN: 8 mL/m2
MV pk E vel: 58.5 m/s
MVPKAVEL: 71.1 m/s
RV LATERAL S' VELOCITY: 8.63 cm/s
S' Lateral: 7.42 cm/s
TAPSE: 17.9 mm
TDI e' lateral: 8.04
Weight: 2059.98 oz

## 2016-10-05 LAB — GLUCOSE, CAPILLARY
GLUCOSE-CAPILLARY: 188 mg/dL — AB (ref 65–99)
Glucose-Capillary: 116 mg/dL — ABNORMAL HIGH (ref 65–99)
Glucose-Capillary: 146 mg/dL — ABNORMAL HIGH (ref 65–99)
Glucose-Capillary: 217 mg/dL — ABNORMAL HIGH (ref 65–99)

## 2016-10-05 LAB — VAS US CAROTID
LCCAPDIAS: 6 cm/s
LCCAPSYS: 81 cm/s
LEFT ECA DIAS: -4 cm/s
LEFT VERTEBRAL DIAS: -3 cm/s
Left CCA dist dias: -8 cm/s
Left CCA dist sys: -60 cm/s
Left ICA prox dias: -20 cm/s
Left ICA prox sys: -97 cm/s
RCCADSYS: -62 cm/s
RCCAPDIAS: 11 cm/s
RIGHT ECA DIAS: -6 cm/s
RIGHT VERTEBRAL DIAS: 11 cm/s
Right CCA prox sys: 80 cm/s

## 2016-10-05 MED ORDER — CLONIDINE HCL 0.1 MG PO TABS
0.1000 mg | ORAL_TABLET | Freq: Two times a day (BID) | ORAL | Status: DC
  Administered 2016-10-05 – 2016-10-06 (×3): 0.1 mg via ORAL
  Filled 2016-10-05 (×3): qty 1

## 2016-10-05 MED ORDER — GABAPENTIN 300 MG PO CAPS
300.0000 mg | ORAL_CAPSULE | Freq: Three times a day (TID) | ORAL | Status: DC
  Administered 2016-10-05 – 2016-10-06 (×5): 300 mg via ORAL
  Filled 2016-10-05 (×5): qty 1

## 2016-10-05 MED ORDER — LORAZEPAM 2 MG/ML IJ SOLN
0.5000 mg | Freq: Once | INTRAMUSCULAR | Status: AC | PRN
  Administered 2016-10-05: 0.5 mg via INTRAVENOUS
  Filled 2016-10-05: qty 1

## 2016-10-05 MED ORDER — HYDRALAZINE HCL 20 MG/ML IJ SOLN: 5.0000 mg | INTRAMUSCULAR | Status: DC | PRN

## 2016-10-05 MED ORDER — RIVAROXABAN 15 MG PO TABS
15.0000 mg | ORAL_TABLET | Freq: Every day | ORAL | Status: DC
  Administered 2016-10-05 – 2016-10-06 (×2): 15 mg via ORAL
  Filled 2016-10-05 (×2): qty 1

## 2016-10-05 MED ORDER — CIPROFLOXACIN HCL 0.3 % OP SOLN
1.0000 [drp] | Freq: Three times a day (TID) | OPHTHALMIC | Status: DC
  Administered 2016-10-05 – 2016-10-06 (×8): 1 [drp] via OPHTHALMIC
  Filled 2016-10-05: qty 2.5

## 2016-10-05 MED ORDER — LOSARTAN POTASSIUM 50 MG PO TABS
25.0000 mg | ORAL_TABLET | Freq: Every day | ORAL | Status: DC
  Administered 2016-10-05 – 2016-10-06 (×2): 25 mg via ORAL
  Filled 2016-10-05 (×2): qty 1

## 2016-10-05 MED ORDER — DICLOFENAC SODIUM 1 % TD GEL
2.0000 g | Freq: Three times a day (TID) | TRANSDERMAL | Status: DC
  Administered 2016-10-05 – 2016-10-06 (×3): 2 g via TOPICAL
  Filled 2016-10-05: qty 100

## 2016-10-05 MED ORDER — B COMPLEX-C PO TABS
1.0000 | ORAL_TABLET | Freq: Every day | ORAL | Status: DC
  Administered 2016-10-05 – 2016-10-06 (×2): 1 via ORAL
  Filled 2016-10-05 (×2): qty 1

## 2016-10-05 MED ORDER — SENNOSIDES-DOCUSATE SODIUM 8.6-50 MG PO TABS: 1.0000 | ORAL_TABLET | Freq: Every evening | ORAL | Status: DC | PRN

## 2016-10-05 MED ORDER — INSULIN ASPART 100 UNIT/ML ~~LOC~~ SOLN
0.0000 [IU] | Freq: Three times a day (TID) | SUBCUTANEOUS | Status: DC
  Administered 2016-10-05: 1 [IU] via SUBCUTANEOUS
  Administered 2016-10-05: 2 [IU] via SUBCUTANEOUS
  Administered 2016-10-06: 3 [IU] via SUBCUTANEOUS
  Administered 2016-10-06: 1 [IU] via SUBCUTANEOUS
  Administered 2016-10-06: 5 [IU] via SUBCUTANEOUS

## 2016-10-05 MED ORDER — OMEGA-3-ACID ETHYL ESTERS 1 G PO CAPS
1000.0000 mg | ORAL_CAPSULE | Freq: Every day | ORAL | Status: DC
  Administered 2016-10-05 – 2016-10-06 (×2): 1000 mg via ORAL
  Filled 2016-10-05 (×2): qty 1

## 2016-10-05 MED ORDER — ONDANSETRON HCL 4 MG/2ML IJ SOLN: 4.0000 mg | Freq: Three times a day (TID) | INTRAMUSCULAR | Status: DC | PRN

## 2016-10-05 MED ORDER — STROKE: EARLY STAGES OF RECOVERY BOOK
Freq: Once | Status: AC
  Administered 2016-10-05: 03:00:00
  Filled 2016-10-05: qty 1

## 2016-10-05 MED ORDER — INSULIN ASPART 100 UNIT/ML ~~LOC~~ SOLN
0.0000 [IU] | Freq: Every day | SUBCUTANEOUS | Status: DC
  Administered 2016-10-05: 2 [IU] via SUBCUTANEOUS

## 2016-10-05 MED ORDER — ZOLPIDEM TARTRATE 5 MG PO TABS: 5.0000 mg | ORAL_TABLET | Freq: Every evening | ORAL | Status: DC | PRN

## 2016-10-05 MED ORDER — VITAMIN D 1000 UNITS PO TABS
5000.0000 [IU] | ORAL_TABLET | Freq: Every day | ORAL | Status: DC
  Administered 2016-10-05 – 2016-10-06 (×2): 5000 [IU] via ORAL
  Filled 2016-10-05 (×2): qty 5

## 2016-10-05 MED ORDER — ATORVASTATIN CALCIUM 20 MG PO TABS
20.0000 mg | ORAL_TABLET | Freq: Every day | ORAL | Status: DC
  Administered 2016-10-05 – 2016-10-06 (×2): 20 mg via ORAL
  Filled 2016-10-05 (×2): qty 2
  Filled 2016-10-05 (×2): qty 1

## 2016-10-05 MED ORDER — ACETAMINOPHEN 500 MG PO TABS
500.0000 mg | ORAL_TABLET | Freq: Four times a day (QID) | ORAL | Status: DC | PRN
  Administered 2016-10-05: 500 mg via ORAL
  Filled 2016-10-05: qty 1

## 2016-10-05 NOTE — Progress Notes (Signed)
OT Cancellation Note  Patient Details Name: [name] MRN: [id number] DOB: 1935/12/05   Cancelled Treatment:    Reason Eval/Treat Not Completed: Patient not medically ready. Pt currently on bedrest orders, please update activity when ready and OT will continue to follow for evaluation.  [place] 10/05/2016, 10:46 AM  Hulda Humphrey OTR/L [phone]

## 2016-10-05 NOTE — Evaluation (Signed)
Physical Therapy Evaluation and Discharge Patient Details Name: [name] MRN: [id number] DOB: 1935-08-09 Today's Date: 10/05/2016   History of Present Illness  81 y.o. female with medical history significant of hypertension, dementia, hyperlipidemia, diabetes mellitus, dementia, TIA, stroke, chronic bilateral leg lymphedema, who presents with  slurred speech, left facial droop, left arm weakness  Clinical Impression  Pt recently returned from MRI prior to session and was lethargic throughout session. Pt presents with generalized baseline weakness grossly L>R. Pt required 2+ max A for bed mobility. PTA patient was dependent in a wheelchair and required assistance for all ADLs. Pt presenting close to baseline per pt's daughter with no active LLE ROM and 2/5 RLE strenght. Pt would benefit from home health PT upon d/c to decrease caregiver burden.   Follow Up Recommendations Home health PT;Supervision/Assistance - 24 hour    Equipment Recommendations  None recommended by PT    Recommendations for Other Services       Precautions / Restrictions Precautions Precautions: Fall Restrictions Weight Bearing Restrictions: No      Mobility  Bed Mobility Overal bed mobility: Needs Assistance Bed Mobility: Supine to Sit     Supine to sit: Max assist;+2 for physical assistance     General bed mobility comments: 2 person max A required or LE and trunk management during supine to sit. Pt required max A to maintain sitting balance.   Transfers                    Ambulation/Gait                Stairs            Wheelchair Mobility    Modified Rankin (Stroke Patients Only) Modified Rankin (Stroke Patients Only) Pre-Morbid Rankin Score: Severe disability Modified Rankin: Severe disability     Balance Overall balance assessment: Needs assistance Sitting-balance support: Bilateral upper extremity supported;Feet supported Sitting balance-Leahy Scale:  Poor Sitting balance - Comments: Pt required max A posteriorly to maintain upright sitting position. Pt able to maintain sitting balance while brushing teeth for ~30 seconds.  Postural control: Posterior lean                                   Pertinent Vitals/Pain Pain Assessment: Faces Faces Pain Scale: Hurts even more Pain Location: "everywhere" Pain Descriptors / Indicators: Aching;Discomfort;Grimacing;Sore Pain Intervention(s): Monitored during session    Home Living Family/patient expects to be discharged to:: Private residence Living Arrangements: Children;Other relatives Available Help at Discharge: Family;Personal care attendant;Available 24 hours/day Type of Home: House       Home Layout: One level Home Equipment: Wheelchair - [employer] - 4 wheels      Prior Function Level of Independence: Needs assistance   Gait / Transfers Assistance Needed: non ambualtory- HHPT following. Dependent for transfers  ADL's / Homemaking Assistance Needed: Pt's daughter states pt can brush her teeth, but requires assistance with all other ADLs        Hand Dominance   Dominant Hand: Right    Extremity/Trunk Assessment   Upper Extremity Assessment Upper Extremity Assessment: Defer to OT evaluation    Lower Extremity Assessment Lower Extremity Assessment: Generalized weakness;RLE deficits/detail;LLE deficits/detail RLE Deficits / Details: Decreased RLE weakness grossly. Pt 3/5 for DF/PF, 2/5 knee flexion and trace for hip flexion.  LLE Deficits / Details: Pt with LLE edema and no active movement in LLE.  Cervical / Trunk Assessment Cervical / Trunk Assessment: Kyphotic  Communication   Communication: No difficulties  Cognition Arousal/Alertness: Lethargic Behavior During Therapy: Flat affect Overall Cognitive Status: Impaired/Different from baseline Area of Impairment: Attention;Following commands;Safety/judgement;Awareness;Problem solving                    Current Attention Level: Focused   Following Commands: Follows one step commands with increased time Safety/Judgement: Decreased awareness of safety;Decreased awareness of deficits Awareness: Intellectual Problem Solving: Slow processing;Difficulty sequencing;Requires verbal cues;Requires tactile cues General Comments: Pt recently returned from MRI and very lethargic during session       General Comments General comments (skin integrity, edema, etc.): Pt's daughter present during session    Exercises     Assessment/Plan    PT Assessment All further PT needs can be met in the next venue of care  PT Problem List Decreased strength;Decreased range of motion;Decreased activity tolerance;Decreased balance;Decreased mobility;Decreased coordination;Decreased cognition;Decreased knowledge of use of DME;Decreased safety awareness;Pain       PT Treatment Interventions  (Defer to next level of care)    PT Goals (Current goals can be found in the Care Plan section)  Acute Rehab PT Goals Patient Stated Goal: to go home PT Goal Formulation: With patient/family Time For Goal Achievement: 10/19/16 Potential to Achieve Goals: Poor    Frequency     Barriers to discharge        Co-evaluation PT/OT/SLP Co-Evaluation/Treatment: Yes Reason for Co-Treatment: Complexity of the patient's impairments (multi-system involvement);For patient/therapist safety PT goals addressed during session: Mobility/safety with mobility;Balance         AM-PAC PT "6 Clicks" Daily Activity  Outcome Measure Difficulty turning over in bed (including adjusting bedclothes, sheets and blankets)?: Total Difficulty moving from lying on back to sitting on the side of the bed? : Total Difficulty sitting down on and standing up from a chair with arms (e.g., wheelchair, bedside commode, etc,.)?: Total Help needed moving to and from a bed to chair (including a wheelchair)?: Total Help needed walking in  hospital room?: Total Help needed climbing 3-5 steps with a railing? : Total 6 Click Score: 6    End of Session   Activity Tolerance: Patient limited by fatigue;Patient limited by lethargy Patient left: in bed;with call bell/phone within reach;with bed alarm set;with family/visitor present Nurse Communication: Mobility status PT Visit Diagnosis: Muscle weakness (generalized) (M62.81)    Time: 1350-1410 PT Time Calculation (min) (ACUTE ONLY): 20 min   Charges:   PT Evaluation $PT Eval Moderate Complexity: 1 Procedure     PT G Codes:   PT G-Codes **NOT FOR INPATIENT CLASS** Functional Assessment Tool Used: Clinical judgement Functional Limitation: Changing and maintaining body position Changing and Maintaining Body Position Current Status (K1601): At least 80 percent but less than 100 percent impaired, limited or restricted Changing and Maintaining Body Position Goal Status (U9323): At least 80 percent but less than 100 percent impaired, limited or restricted Changing and Maintaining Body Position Discharge Status [phone]): At least 80 percent but less than 100 percent impaired, limited or restricted    Loma Sousa, SPT  [phone]  Loma Sousa 10/05/2016, 3:36 PM

## 2016-10-05 NOTE — Progress Notes (Addendum)
STROKE TEAM PROGRESS NOTE   SUBJECTIVE (INTERVAL HISTORY) Patient is alone. She has) history of dementia at baseline and is wheelchair-bound. She presented with transient slurred speech and facial droop which appears to resolve. MRI scan of the brain and MRA of the brain were personally reviewed and are negative for acute stroke but motion degraded.   OBJECTIVE Temp:  [97.7 F (36.5 C)-98.6 F (37 C)] 97.7 F (36.5 C) (05/30 0901) Pulse Rate:  [62-79] 65 (05/30 0901) Cardiac Rhythm: Normal sinus rhythm (05/30 0700) Resp:  [10-22] 17 (05/30 0901) BP: (147-194)/(69-92) 173/71 (05/30 0901) SpO2:  [96 %-100 %] 100 % (05/30 0901) Weight:  [58.4 kg (128 lb 12 oz)] 58.4 kg (128 lb 12 oz) (05/30 0228)  CBC:  Recent Labs Lab 10/04/16 2134 10/04/16 2212  WBC 5.8  --   NEUTROABS 2.8  --   HGB 11.6* 12.6  HCT 37.2 37.0  MCV 88.2  --   PLT 222  --     Basic Metabolic Panel:  Recent Labs Lab 10/04/16 2134 10/04/16 2212  NA 140 138  K 4.5 4.4  CL 102 101  CO2 27  --   GLUCOSE 154* 149*  BUN 18 17  CREATININE 0.52 0.50  CALCIUM 9.6  --    HgbA1c:  Lab Results  Component Value Date   HGBA1C 6.1 (H) 05/11/2016    PHYSICAL EXAM Frail elderly lady not in distress. . Afebrile. Head is nontraumatic. Neck is supple without bruit.    Cardiac exam no murmur or gallop. Lungs are clear to auscultation. Distal pulses are well felt. Neurological Exam ;  Awake  Alert oriented x 1. Diminished attention, registration and recall. Poor insight into her condition. Flight of ideas. No dysarthria but slightly hesitant speech.eye movements full without nystagmus.fundi were not visualized. Vision acuity and fields appear normal. Hearing is normal. Palatal movements are normal. Face symmetric. Tongue midline. Normal strength, tone, reflexes and coordination. Normal sensation. Gait deferred.  ASSESSMENT/PLAN [name] is a 81 y.o. female with history of diabetes mellitus, hypertension,  hyperlipidemia, dementia, deep vein thrombosis and pulmonary embolism on anticoagulation presenting to [facility] with transient L facial droop and L hemipareiss. She did not receive IV t-PA due to resolved deficits, on xarelto.   Right brain TIA  CT no acute stroke. Atrophy. Small vessel disease. 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 Aspects 10.    MRI  no acute infarct. Motion degraded.  MRA  no large vessel stenosis. Motion degraded. Carotid Doppler   Bilateral:  1-39% ICA stenosis.  Vertebral artery flow is antegrade.2D Echo Left ventricle: The cavity size was normal. Wall thickness was   increased in a pattern of moderate LVH. Systolic function was   normal. The estimated ejection fraction was in the range of 60%   to 65%. Wall motion was normal; there were no regional wall    motion abnormalities  LDL 90  HgbA1c pending  xarleto for VTE prophylaxis  Diet heart healthy/carb modified Room service appropriate? Yes; Fluid consistency: Thin  Xarelto (rivaroxaban) daily prior to admission, now on Xarelto (rivaroxaban) daily  Therapy recommendations:  pending   Disposition:  pending   ? Hypercoagulable state  Multiple DVT or PE in the past   Treated with coumadin for a long time  Now on xarelto  followed by Dr. Jonette Eva   Hypertension  Elevated but Stable  Permissive hypertension (OK if < 220/120) but gradually normalize in 5-7 days Long-term BP goal normotensive  Hyperlipidemia  Home meds:  lipitor 20, resumed in hospital  LDL 90. above goal  Continue statin at discharge  Diabetes type II  HgbA1c pending, goal < 7.0  Other Stroke Risk Factors  Advanced age  No ETOH use. Level < 5 on admission  UDS not performed  Hx stroke/TIA  05/2016 - Right brain TIA secondary to ? hypercoagulable state Erlinda Hong)  Family hx stroke (sister)  Other Active Problems  Baseline dementia, on [facility] day # 0  I have personally examined this patient, reviewed notes, independently  viewed imaging studies, participated in medical decision making and plan of care.ROS completed by me personally and pertinent positives fully documented  I have made any additions or clarifications directly to the above note. She presented with transient slurred speech, left facial droop and weakness likely related to right brain TIA. Etiology likely small vessel disease. Continue Xarelto for stroke prevention given history of DVT and pulmonary embolism and ongoing stroke workup. No family available at the bedside for discussion. Greater than 50% time during this 25 minute visit was spent on coordination of care about her stroke and TIA risk, evaluation and treatment plan and discussion with Dr. Woodfin Ganja, [place] Pager: [phone] 10/05/2016 4:09 PM   To contact Stroke Continuity provider, please refer to [email or web address] After hours, contact General Neurology

## 2016-10-05 NOTE — Progress Notes (Signed)
PT Cancellation Note  Patient Details Name: [name] MRN: [id number] DOB: 08/23/1935   Cancelled Treatment:      Reason Eval/Treat Not Completed: Patient not medically ready. Pt currently on bedrest orders.   Duncan Dull 10/05/2016, 10:51 AM

## 2016-10-05 NOTE — Progress Notes (Signed)
Patient seen and examined at bedside, patient admitted after midnight, please see earlier detailed admission note by Ivor Costa, MD. Briefly, patient presented with concern for TIA. Symptoms resolved. Workup pending. Neurology on board. PT/OT evals, echocardiogram.   Cordelia Poche, MD [facility]  10/05/2016, 1:20 PM Pager: [phone]

## 2016-10-05 NOTE — H&P (Signed)
History and Physical    [name] VVO:[id number] DOB: 08/05/1935 DOA: 10/04/2016  Referring MD/NP/PA:   PCP: Gildardo Cranker, DO   Patient coming from:  The patient is coming from home.  At baseline, pt is independent for most of ADL.   Chief Complaint: Slurred speech, left facial droop, left arm weakness  HPI: [name] is a 81 y.o. female with medical history significant of hypertension, dementia, hyperlipidemia, diabetes mellitus, dementia, TIA, stroke, chronic bilateral leg lymphedema, who presents with  slurred speech, left facial droop, left arm weakness.  Per her daughter-in-law, patient was noted to have slurred speech, left facial droop, left arm weakness at about 7:20 this evening. The symptoms lasted for about 2 hours, then gradually resolved at arrival to the emergency room. Currently patient moves all extremities. No facial droop or slurred speech. Patient denies chest pain, shortness of breath or cough. No nausea, vomiting, diarrhea, abdominal pain, diarrhea, symptoms of UTI.  ED Course: pt was found to have WBC 5.8, negative troponin, INR 1.43, PTT 37, electrolytes renal function okay, temperature normal, no tachycardia, O2 sat 99% on room air, CT head is negative for acute intracranial abnormalities. Alcohol level less than 5, pending urinalysis. Patient is placed on telemetry bed for observation. Neurology, Dr. Nicole Kindred was consulted.  Review of Systems:   General: no fevers, chills, no changes in body weight, has fatigue HEENT: no blurry vision, hearing changes or sore throat Respiratory: no dyspnea, coughing, wheezing CV: no chest pain, no palpitations GI: no nausea, vomiting, abdominal pain, diarrhea, constipation GU: no dysuria, burning on urination, increased urinary frequency, hematuria  Ext: has leg edema Neuro: has slurred speech, left facial droop, left arm weakness Skin: no rash, no skin tear. MSK: No muscle spasm, no deformity, no limitation of range of  movement in spin Heme: No easy bruising.  Travel history: No recent long distant travel.  Allergy: No Known Allergies  Past Medical History:  Diagnosis Date  . Dementia   . Diabetes mellitus type II 03/21/2011  . DVT (deep venous thrombosis) ([place]) 03/21/2011  . Hyperlipidemia 03/21/2011  . Hypertension 03/21/2011  . Lymphedema of leg 03/21/2011  . Pulmonary embolism ([place]) 03/21/2011    Past Surgical History:  Procedure Laterality Date  . CHOLECYSTECTOMY  2015  . SKIN TAG REMOVAL  07/25/2016  . VAGINAL HYSTERECTOMY     unknown of date  . VASCULAR SURGERY      Social History:  reports that she has never smoked. She has never used smokeless tobacco. She reports that she does not drink alcohol or use drugs.  Family History:  Family History  Problem Relation Age of Onset  . Heart attack Mother   . COPD Sister   . Stroke Sister   . Bipolar disorder Daughter      Prior to Admission medications   Medication Sig Start Date End Date Taking? Authorizing Provider  acetaminophen (TYLENOL) 500 MG tablet Take 500 mg by mouth every 6 (six) hours as needed.   Yes [provider]  atorvastatin (LIPITOR) 20 MG tablet Take 1 tablet (20 mg total) by mouth daily at 6 PM. 09/14/16  Yes Estill Dooms, MD  B Complex Vitamins (VITAMIN B COMPLEX PO) Take 1 tablet by mouth daily.    Yes [provider]  cholecalciferol (VITAMIN D) 1000 units tablet Take 5,000 Units by mouth daily.    Yes [provider]  ciprofloxacin (CILOXAN) 0.3 % ophthalmic solution Administer 1 drop QID x 7 days into both  eyes 09/28/16  Yes Gildardo Cranker, DO  cloNIDine (CATAPRES) 0.1 MG tablet Take 1 tablet (0.1 mg total) by mouth 2 (two) times daily. 09/14/16  Yes Estill Dooms, MD  diclofenac sodium (VOLTAREN) 1 % GEL Apply topically 3 (three) times daily.   Yes [provider]  gabapentin (NEURONTIN) 300 MG capsule Take 1 capsule (300 mg total) by mouth 3 (three) times daily. 09/14/16  Yes  Estill Dooms, MD  glucose blood (ACCU-CHEK AVIVA PLUS) test strip 1 each by Other route 2 (two) times daily. E11.9 09/16/16  Yes Eulas Post, Monica, DO  losartan (COZAAR) 25 MG tablet Take 1 tablet (25 mg total) by mouth daily. 09/14/16  Yes Estill Dooms, MD  metFORMIN (GLUCOPHAGE) 1000 MG tablet Take 1 tablet (1,000 mg total) by mouth daily with breakfast. Take 1/2 tablet by mouth in the evening to control blood sugar 09/14/16  Yes Estill Dooms, MD  Omega 3 1200 MG CAPS Take 1,200 mg by mouth daily.    Yes [provider]  OVER THE COUNTER MEDICATION Take 1 capsule by mouth daily. "ultra flore balance"   Yes [provider]  Rivaroxaban (XARELTO) 15 MG TABS tablet Take 1 tablet (15 mg total) by mouth daily with supper. 09/14/16  Yes Estill Dooms, MD    Physical Exam: Vitals:   10/05/16 0100 10/05/16 0149 10/05/16 0228 10/05/16 0400  BP: (!) 174/91 (!) 180/84 (!) 168/72 (!) 183/77  Pulse:  78 63 71  Resp: 10 12 18 20   Temp:  98.6 F (37 C) 97.8 F (36.6 C)   TempSrc:  Oral Oral   SpO2:  96% 100% 97%  Weight:   58.4 kg (128 lb 12 oz)   Height:   5\' 6"  (1.676 m)    General: Not in acute distress HEENT:       Eyes: PERRL, EOMI, no scleral icterus.       ENT: No discharge from the ears and nose, no pharynx injection, no tonsillar enlargement.        Neck: No JVD, no bruit, no mass felt. Heme: No neck lymph node enlargement. Cardiac: S1/S2, RRR, No murmurs, No gallops or rubs. Respiratory: No rales, wheezing, rhonchi or rubs. GI: Soft, nondistended, nontender, no rebound pain, no organomegaly, BS present. GU: No hematuria Ext: 3+ pitting leg edema bilaterally. 2+DP/PT pulse bilaterally. Musculoskeletal: No joint deformities, No joint redness or warmth, no limitation of ROM in spin. Skin: No rashes.  Neuro: Alert, oriented X3, cranial nerves II-XII grossly intact, moves all extremities normally. Muscle strength 5/5 in all extremities, sensation to light touch intact.  Brachial reflex 2+ bilaterally. Negative Babinski's sign.  Psych: Patient is not psychotic, no suicidal or hemocidal ideation.  Labs on Admission: I have personally reviewed following labs and imaging studies  CBC:  Recent Labs Lab 10/04/16 2134 10/04/16 2212  WBC 5.8  --   NEUTROABS 2.8  --   HGB 11.6* 12.6  HCT 37.2 37.0  MCV 88.2  --   PLT 222  --    Basic Metabolic Panel:  Recent Labs Lab 10/04/16 2134 10/04/16 2212  NA 140 138  K 4.5 4.4  CL 102 101  CO2 27  --   GLUCOSE 154* 149*  BUN 18 17  CREATININE 0.52 0.50  CALCIUM 9.6  --    GFR: Estimated Creatinine Clearance: 52.6 mL/min (by C-G formula based on SCr of 0.5 mg/dL). Liver Function Tests: No results for input(s): AST, ALT, ALKPHOS, BILITOT, PROT,  ALBUMIN in the last 168 hours. No results for input(s): LIPASE, AMYLASE in the last 168 hours. No results for input(s): AMMONIA in the last 168 hours. Coagulation Profile:  Recent Labs Lab 10/04/16 2148  INR 1.43   Cardiac Enzymes: No results for input(s): CKTOTAL, CKMB, CKMBINDEX, TROPONINI in the last 168 hours. BNP (last 3 results) No results for input(s): PROBNP in the last 8760 hours. HbA1C: No results for input(s): HGBA1C in the last 72 hours. CBG: No results for input(s): GLUCAP in the last 168 hours. Lipid Profile: No results for input(s): CHOL, HDL, LDLCALC, TRIG, CHOLHDL, LDLDIRECT in the last 72 hours. Thyroid Function Tests: No results for input(s): TSH, T4TOTAL, FREET4, T3FREE, THYROIDAB in the last 72 hours. Anemia Panel: No results for input(s): VITAMINB12, FOLATE, FERRITIN, TIBC, IRON, RETICCTPCT in the last 72 hours. Urine analysis:    Component Value Date/Time   COLORURINE YELLOW 08/23/2016 1949   APPEARANCEUR HAZY (A) 08/23/2016 1949   LABSPEC 1.012 08/23/2016 1949   PHURINE 8.0 08/23/2016 1949   GLUCOSEU 150 (A) 08/23/2016 1949   HGBUR SMALL (A) 08/23/2016 1949   BILIRUBINUR NEGATIVE 08/23/2016 1949   KETONESUR 5 (A)  08/23/2016 1949   PROTEINUR 30 (A) 08/23/2016 1949   NITRITE POSITIVE (A) 08/23/2016 1949   LEUKOCYTESUR TRACE (A) 08/23/2016 1949   Sepsis Labs: @LABRCNTIP (procalcitonin:4,lacticidven:4) )No results found for this or any previous visit (from the past 240 hour(s)).   Radiological Exams on Admission: Ct Head Wo Contrast  Result Date: 10/04/2016 CLINICAL DATA:  81 year old female with left-sided facial drooping and slurred speech. EXAM: CT HEAD WITHOUT CONTRAST TECHNIQUE: Contiguous axial images were obtained from the base of the skull through the vertex without intravenous contrast. COMPARISON:  Head CT dated 08/23/2016 FINDINGS: Brain: There is moderate age-related atrophy and chronic microvascular ischemic changes. There is no acute intracranial hemorrhage. No mass effect or midline shift noted. Focal area of old infarct and encephalomalacia in the left cerebellar hemisphere similar to prior CT. Vascular: No hyperdense vessel or unexpected calcification. Skull: Normal. Negative for fracture or focal lesion. Sinuses/Orbits: No acute finding. Other: None IMPRESSION: 1. No acute intracranial hemorrhage. 2. Moderate age-related atrophy and chronic microvascular ischemic changes. 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 If symptoms persist, and there are no contraindications, MRI may provide better evaluation if clinically indicated. Electronically Signed   By: Anner Crete M.D.   On: 10/04/2016 22:38     EKG: Independently reviewed.  Sinus rhythm, QTC 426, early R-wave progression, nonspecific T-wave change.   Assessment/Plan Principal Problem:   TIA (transient ischemic attack) Active Problems:   History of pulmonary embolism   History of DVT (deep vein thrombosis)   Diabetes mellitus type II, non insulin dependent (HCC)   Hyperlipidemia   Hypertension   Dementia   TIA (transient ischemic attack): pt had slurred speech, left facial droop, left arm weakness,which have resolved, consistent  with TIA. CT-head negative for acute intracranial abnormalities. Neurology, Dr. Nicole Kindred was consulted.  - will place on tele bed for obs - Neurology was consulted by EDP, will follow up recommendations. - Risk factor modification: HgbA1c, UDS. LDL was 90 on 09/26/16  - MRI, MRA of the brain without contrast  - PT consult, OT consult - Bedside swallowing screen was ordered, will get speech consult in AM - 2 d Echocardiogram  - Ekg  - Carotid dopplers  - pt is on Xarelto for PE, will not start ASA  Hx of PE and DVT -continue Xarelto   DM-II: Last A1c  6.1 on 05/11/16, well controled. Patient is taking metformin at home -SSI  HLD: -lipitor  Hypertension: -continue clonidine, losartan IV hydralazine when necessary-   DVT ppx: on Xarelto Code Status: Full code Family Communication:  Yes, patient's  daughter in law  at bed side Disposition Plan:  Anticipate discharge back to previous home environment Consults called:  neurology, Dr. Nicole Kindred Admission status: Obs / tele   Date of Service 10/05/2016    Ivor Costa [facility] Pager [phone]  If 7PM-7AM, please contact night-coverage [email or web address] Password [facility] 10/05/2016, 5:33 AM

## 2016-10-05 NOTE — Progress Notes (Signed)
Pt admitted from [facility] ED with stroke symptoms, alert and oriented to self only, accompanied by family, pt settled in bed with call light at bedside, safety concerned addressed and explained to pt and family as well, was however reassured, will continue to monitor. Obasogie-Asidi, Marcelline Temkin Efe

## 2016-10-05 NOTE — Consult Note (Addendum)
Admission H&P  Referring physician: Malvin Johns, MD    Chief Complaint: Acute onset of slurred speech, facial droop and left-sided weakness.  HPI: [name] is an 81 y.o. female with a history of diabetes mellitus, hypertension, hyperlipidemia, dementia, deep vein thrombosis and pulmonary embolism on anticoagulation, brought to the emergency room following acute onset of left facial droop and extremity weakness as well as slurred speech at 7:20 PM on 10/04/2016. She was evaluated in January 2018 for TIA. Echocardiogram and carotid Doppler were unremarkable. CT scan of her head tonight showed no acute intracranial abnormalities. Patient's deficits resolved after about 2 hours.  LSN: 7:20 PM on 10/04/2016 tPA Given: No: On Xaralto; deficits resolved. mRankin:  Past Medical History:  Diagnosis Date  . Dementia   . Diabetes mellitus type II 03/21/2011  . DVT (deep venous thrombosis) ([place]) 03/21/2011  . Hyperlipidemia 03/21/2011  . Hypertension 03/21/2011  . Lymphedema of leg 03/21/2011  . Pulmonary embolism ([place]) 03/21/2011    Past Surgical History:  Procedure Laterality Date  . CHOLECYSTECTOMY  2015  . SKIN TAG REMOVAL  07/25/2016  . VAGINAL HYSTERECTOMY     unknown of date  . VASCULAR SURGERY      Family History  Problem Relation Age of Onset  . Heart attack Mother   . COPD Sister   . Stroke Sister   . Bipolar disorder Daughter    Social History:  reports that she has never smoked. She has never used smokeless tobacco. She reports that she does not drink alcohol or use drugs.  Allergies: No Known Allergies  Medications Prior to Admission  Medication Sig Dispense Refill  . acetaminophen (TYLENOL) 500 MG tablet Take 500 mg by mouth every 6 (six) hours as needed.    Marland Kitchen atorvastatin (LIPITOR) 20 MG tablet Take 1 tablet (20 mg total) by mouth daily at 6 PM. 90 tablet 1  . B Complex Vitamins (VITAMIN B COMPLEX PO) Take 1 tablet by mouth daily.     . cholecalciferol  (VITAMIN D) 1000 units tablet Take 5,000 Units by mouth daily.     . ciprofloxacin (CILOXAN) 0.3 % ophthalmic solution Administer 1 drop QID x 7 days into both eyes 5 mL 1  . cloNIDine (CATAPRES) 0.1 MG tablet Take 1 tablet (0.1 mg total) by mouth 2 (two) times daily. 180 tablet 1  . diclofenac sodium (VOLTAREN) 1 % GEL Apply topically 3 (three) times daily.    Marland Kitchen gabapentin (NEURONTIN) 300 MG capsule Take 1 capsule (300 mg total) by mouth 3 (three) times daily. 270 capsule 1  . glucose blood (ACCU-CHEK AVIVA PLUS) test strip 1 each by Other route 2 (two) times daily. E11.9 100 each 5  . losartan (COZAAR) 25 MG tablet Take 1 tablet (25 mg total) by mouth daily. 90 tablet 1  . metFORMIN (GLUCOPHAGE) 1000 MG tablet Take 1 tablet (1,000 mg total) by mouth daily with breakfast. Take 1/2 tablet by mouth in the evening to control blood sugar 135 tablet 1  . Omega 3 1200 MG CAPS Take 1,200 mg by mouth daily.     Marland Kitchen OVER THE COUNTER MEDICATION Take 1 capsule by mouth daily. "ultra flore balance"    . Rivaroxaban (XARELTO) 15 MG TABS tablet Take 1 tablet (15 mg total) by mouth daily with supper. 90 tablet 1    ROS: Unavailable due to patient's cognitive deficits.  Physical Examination: Blood pressure (!) 183/77, pulse 71, temperature 97.8 F (36.6 C), temperature source Oral, resp. rate 20, height  _0  (1.676 m), weight 58.4 kg (128 lb 12 oz), SpO2 97 %.  HEENT-  Normocephalic, no lesions, without obvious abnormality.  Normal external eye and conjunctiva.  Normal TM's bilaterally.  Normal auditory canals and external ears. Normal external nose, mucus membranes and septum.  Normal pharynx. Neck supple with no masses, nodes, nodules or enlargement. Cardiovascular - regular rate and rhythm, S1, S2 normal, no murmur, click, rub or gallop Lungs - chest clear, no wheezing, rales, normal symmetric air entry Abdomen - soft, non-tender; bowel sounds normal; no masses,  no organomegaly Extremities - edema of  legs and feet noted, left greater than right  Neurologic Examination: Mental Status: Alert, disoriented to time and place, no acute distress.  Speech slightly slurred without evidence of aphasia. Able to follow commands without difficulty. Cranial Nerves: II-Visual fields were normal. III/IV/VI-Pupils were equal and reacted normally to light. Extraocular movements were full and conjugate except for mild left esotropia.    V/VII-no facial numbness and no facial weakness. VIII-normal. X-speech was slightly slurred; symmetrical palatal movement.. Motor: 5/5 bilaterally in upper extremities as well as right lower extremity; strength of left lower extremity was difficult to assess because of pain with movement and guarding. Sensory: Normal throughout. Deep Tendon Reflexes: Absent in lower extremities Plantars: Mute bilaterally Cerebellar: Normal finger-to-nose testing. Carotid auscultation: Normal  Results for orders placed or performed during the hospital encounter of 10/04/16 (from the past 48 hour(s))  CBC with Differential     Status: Abnormal   Collection Time: 10/04/16  9:34 PM  Result Value Ref Range   WBC 5.8 4.0 - 10.5 K/uL   RBC 4.22 3.87 - 5.11 MIL/uL   Hemoglobin 11.6 (L) 12.0 - 15.0 g/dL   HCT 37.2 36.0 - 46.0 %   MCV 88.2 78.0 - 100.0 fL   MCH 27.5 26.0 - 34.0 pg   MCHC 31.2 30.0 - 36.0 g/dL   RDW 13.9 11.5 - 15.5 %   Platelets 222 150 - 400 K/uL   Neutrophils Relative % 48 %   Neutro Abs 2.8 1.7 - 7.7 K/uL   Lymphocytes Relative 40 %   Lymphs Abs 2.3 0.7 - 4.0 K/uL   Monocytes Relative 11 %   Monocytes Absolute 0.6 0.1 - 1.0 K/uL   Eosinophils Relative 1 %   Eosinophils Absolute 0.1 0.0 - 0.7 K/uL   Basophils Relative 0 %   Basophils Absolute 0.0 0.0 - 0.1 K/uL  Basic metabolic panel     Status: Abnormal   Collection Time: 10/04/16  9:34 PM  Result Value Ref Range   Sodium 140 135 - 145 mmol/L   Potassium 4.5 3.5 - 5.1 mmol/L   Chloride 102 101 - 111 mmol/L    CO2 27 22 - 32 mmol/L   Glucose, Bld 154 (H) 65 - 99 mg/dL   BUN 18 6 - 20 mg/dL   Creatinine, Ser 0.52 0.44 - 1.00 mg/dL   Calcium 9.6 8.9 - 10.3 mg/dL   GFR calc non Af Amer >60 >60 mL/min   GFR calc Af Amer >60 >60 mL/min    Comment: (NOTE) The eGFR has been calculated using the CKD EPI equation. This calculation has not been validated in all clinical situations. eGFR's persistently <60 mL/min signify possible Chronic Kidney Disease.    Anion gap 11 5 - 15  Protime-INR     Status: Abnormal   Collection Time: 10/04/16  9:48 PM  Result Value Ref Range   Prothrombin Time 17.5 (H) 11.4 -  15.2 seconds   INR 1.43   APTT     Status: Abnormal   Collection Time: 10/04/16  9:48 PM  Result Value Ref Range   aPTT 37 (H) 24 - 36 seconds    Comment:        IF BASELINE aPTT IS ELEVATED, SUGGEST PATIENT RISK ASSESSMENT BE USED TO DETERMINE APPROPRIATE ANTICOAGULANT THERAPY.   Ethanol     Status: None   Collection Time: 10/04/16  9:48 PM  Result Value Ref Range   Alcohol, Ethyl (B) <5 <5 mg/dL    Comment:        LOWEST DETECTABLE LIMIT FOR SERUM ALCOHOL IS 5 mg/dL FOR MEDICAL PURPOSES ONLY   I-Stat Troponin, ED (not at [facility])     Status: None   Collection Time: 10/04/16 10:09 PM  Result Value Ref Range   Troponin i, poc 0.01 0.00 - 0.08 ng/mL   Comment 3            Comment: Due to the release kinetics of cTnI, a negative result within the first hours of the onset of symptoms does not rule out myocardial infarction with certainty. If myocardial infarction is still suspected, repeat the test at appropriate intervals.   I-Stat Chem 8, ED     Status: Abnormal   Collection Time: 10/04/16 10:12 PM  Result Value Ref Range   Sodium 138 135 - 145 mmol/L   Potassium 4.4 3.5 - 5.1 mmol/L   Chloride 101 101 - 111 mmol/L   BUN 17 6 - 20 mg/dL   Creatinine, Ser 0.50 0.44 - 1.00 mg/dL   Glucose, Bld 149 (H) 65 - 99 mg/dL   Calcium, Ion 1.16 1.15 - 1.40 mmol/L   TCO2 27 0 - 100 mmol/L    Hemoglobin 12.6 12.0 - 15.0 g/dL   HCT 37.0 36.0 - 46.0 %   Ct Head Wo Contrast  Result Date: 10/04/2016 CLINICAL DATA:  81 year old female with left-sided facial drooping and slurred speech. EXAM: CT HEAD WITHOUT CONTRAST TECHNIQUE: Contiguous axial images were obtained from the base of the skull through the vertex without intravenous contrast. COMPARISON:  Head CT dated 08/23/2016 FINDINGS: Brain: There is moderate age-related atrophy and chronic microvascular ischemic changes. There is no acute intracranial hemorrhage. No mass effect or midline shift noted. Focal area of old infarct and encephalomalacia in the left cerebellar hemisphere similar to prior CT. Vascular: No hyperdense vessel or unexpected calcification. Skull: Normal. Negative for fracture or focal lesion. Sinuses/Orbits: No acute finding. Other: None IMPRESSION: 1. No acute intracranial hemorrhage. 2. Moderate age-related atrophy and chronic microvascular ischemic changes. 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 If symptoms persist, and there are no contraindications, MRI may provide better evaluation if clinically indicated. Electronically Signed   By: Anner Crete M.D.   On: 10/04/2016 22:38    Assessment: 81 y.o. female with multiple risk factors for stroke as well as history of TIA in January 2018 presenting with probable recurrent TIA. A small vessel subcortical right cerebral infarction cannot be ruled out at this point, however.  Stroke Risk Factors - diabetes mellitus, hyperlipidemia and hypertension  Plan: 1. HgbA1c, fasting lipid panel 2. MRI, MRA  of the brain without contrast 3. PT consult, OT consult, Speech consult 4. Prophylactic therapy-Anticoagulation: Xaralto 5. Risk factor modification 6. Telemetry monitoring  C.R. Nicole Kindred, MD [facility] [phone]  10/05/2016, 5:24 AM

## 2016-10-05 NOTE — Progress Notes (Signed)
  Echocardiogram 2D Echocardiogram has been performed.  [name] 10/05/2016, 3:33 PM

## 2016-10-05 NOTE — Progress Notes (Signed)
SLP Cancellation Note  Patient Details Name: [name] MRN: [id number] DOB: Jan 06, 1936   Cancelled treatment:       Reason Eval/Treat Not Completed: Patient at procedure or test/unavailable   Juan Quam Laurice 10/05/2016, 3:24 PM

## 2016-10-05 NOTE — Progress Notes (Signed)
VASCULAR LAB PRELIMINARY  PRELIMINARY  PRELIMINARY  PRELIMINARY  Carotid duplex completed.    Preliminary report:  Bilateral:  1-39% ICA stenosis.  Vertebral artery flow is antegrade.     Kensley Valladares, RVS 10/05/2016, 1:35 PM

## 2016-10-05 NOTE — Evaluation (Signed)
Occupational Therapy Evaluation and Discharge Patient Details Name: Pattye Meda MRN: [id number] DOB: November 25, 1935 Today's Date: 10/05/2016    History of Present Illness 81 y.o. female with medical history significant of hypertension, dementia, hyperlipidemia, diabetes mellitus, dementia, TIA, stroke, chronic bilateral leg lymphedema, who presents with  slurred speech, left facial droop, left arm weakness   Clinical Impression   PTA Pt max A for ADL and total assist for trasfers. Pt currently presenting with decreased ability to participate in ADL this session (see performance and OT problem list below). Pt is +2 max assist for bed mobility and presents with decreased ability to follow directions (caregiver states this is due to lethargy as Pt just returned from MRI) Pt will require continued HHOT to maximize safety and independence in ADL and for caregiver education to decrease burden of care.    Follow Up Recommendations  Home health OT;Supervision/Assistance - 24 hour    Equipment Recommendations  None recommended by OT    Recommendations for Other Services       Precautions / Restrictions Precautions Precautions: Fall Restrictions Weight Bearing Restrictions: No      Mobility Bed Mobility Overal bed mobility: Needs Assistance Bed Mobility: Supine to Sit     Supine to sit: Max assist;+2 for physical assistance     General bed mobility comments: 2 person max A required or LE and trunk management during supine to sit. Pt required max A to maintain sitting balance.   Transfers                 General transfer comment: Transfer not attempted this session    Balance Overall balance assessment: Needs assistance Sitting-balance support: Bilateral upper extremity supported;Feet supported Sitting balance-Leahy Scale: Poor Sitting balance - Comments: Pt required max A posteriorly to maintain upright sitting position. Pt able to maintain sitting balance while brushing  teeth for ~30 seconds.  Postural control: Posterior lean                                 ADL either performed or assessed with clinical judgement   ADL Overall ADL's : Needs assistance/impaired                                       General ADL Comments: At baseline, Pt requires assist with all ADL except brushing teeth. Pt presenting with decreased ability to follow directions, and increased caregiver burden     Vision         Perception     Praxis      Pertinent Vitals/Pain Pain Assessment: Faces Faces Pain Scale: Hurts even more Pain Location: "everywhere" Pain Descriptors / Indicators: Aching;Discomfort;Grimacing;Sore Pain Intervention(s): Monitored during session;Repositioned;Limited activity within patient's tolerance     Hand Dominance Right   Extremity/Trunk Assessment Upper Extremity Assessment Upper Extremity Assessment: Generalized weakness;Difficult to assess due to impaired cognition   Lower Extremity Assessment Lower Extremity Assessment: Defer to PT evaluation RLE Deficits / Details: Decreased RLE weakness grossly. Pt 3/5 for DF/PF, 2/5 knee flexion and trace for hip flexion.  LLE Deficits / Details: Pt with LLE edema and no active movement in LLE.    Cervical / Trunk Assessment Cervical / Trunk Assessment: Kyphotic   Communication Communication Communication: No difficulties   Cognition Arousal/Alertness: Lethargic Behavior During Therapy: Flat affect Overall Cognitive Status: Impaired/Different from baseline Area  of Impairment: Attention;Following commands;Safety/judgement;Awareness;Problem solving                   Current Attention Level: Focused   Following Commands: Follows one step commands with increased time Safety/Judgement: Decreased awareness of safety;Decreased awareness of deficits Awareness: Intellectual Problem Solving: Slow processing;Difficulty sequencing;Requires verbal cues;Requires tactile  cues General Comments: Pt recently returned from MRI and very lethargic during session    General Comments  Pt's adopted daughter/caregiver present during session    Exercises     Shoulder Instructions      Home Living Family/patient expects to be discharged to:: Private residence Living Arrangements: Children;Other relatives Available Help at Discharge: Family;Personal care attendant;Available 24 hours/day Type of Home: House Home Access: Ramped entrance     Home Layout: One level     Bathroom Shower/Tub: Tub/shower unit (Does bed baths)   Bathroom Toilet:  (wears adult diapers) Bathroom Accessibility: Yes How Accessible: Accessible via wheelchair Home Equipment: Wheelchair - manual;Walker - 4 wheels          Prior Functioning/Environment Level of Independence: Needs assistance  Gait / Transfers Assistance Needed: non ambualtory- HHPT following. Dependent for transfers ADL's / Homemaking Assistance Needed: Pt's adopted daughter/caregiver states pt can brush her teeth, but requires assistance with all other ADLs   Comments: history per chart review        OT Problem List: Decreased strength;Decreased range of motion;Decreased activity tolerance;Impaired balance (sitting and/or standing);Decreased cognition;Decreased safety awareness;Pain      OT Treatment/Interventions:      OT Goals(Current goals can be found in the care plan section) Acute Rehab OT Goals Patient Stated Goal: to go home OT Goal Formulation: With family Time For Goal Achievement: 10/19/16 Potential to Achieve Goals: Good  OT Frequency:     Barriers to D/C:            Co-evaluation PT/OT/SLP Co-Evaluation/Treatment: Yes Reason for Co-Treatment: Complexity of the patient's impairments (multi-system involvement);For patient/therapist safety PT goals addressed during session: Mobility/safety with mobility;Balance OT goals addressed during session: ADL's and self-care      AM-PAC PT "6  Clicks" Daily Activity     Outcome Measure Help from another person eating meals?: A Lot Help from another person taking care of personal grooming?: A Lot Help from another person toileting, which includes using toliet, bedpan, or urinal?: Total Help from another person bathing (including washing, rinsing, drying)?: Total Help from another person to put on and taking off regular upper body clothing?: A Lot Help from another person to put on and taking off regular lower body clothing?: Total 6 Click Score: 9   End of Session Nurse Communication: Mobility status  Activity Tolerance: Patient tolerated treatment well Patient left: in bed;with bed alarm set;with call bell/phone within reach;with family/visitor present  OT Visit Diagnosis: Muscle weakness (generalized) (M62.81);Other symptoms and signs involving cognitive function                Time: 1350-1410 OT Time Calculation (min): 20 min Charges:  OT General Charges $OT Visit: 1 Procedure OT Evaluation $OT Eval Moderate Complexity: 1 Procedure G-Codes: OT G-codes **NOT FOR INPATIENT CLASS** Functional Assessment Tool Used: AM-PAC 6 Clicks Daily Activity Functional Limitation: Self care Self Care Current Status (A6301): At least 60 percent but less than 80 percent impaired, limited or restricted Self Care Goal Status (S0109): At least 60 percent but less than 80 percent impaired, limited or restricted Self Care Discharge Status [phone]): At least 60 percent but less than 80  percent impaired, limited or restricted   Hulda Humphrey OTR/L [place] 10/05/2016, 5:00 PM

## 2016-10-05 NOTE — CV Procedure (Signed)
2D echo attempted, but Pt in MRI, will try later

## 2016-10-05 NOTE — Care Management Note (Signed)
Case Management Note  Patient Details  Name: Nisha Dhami MRN: [id number] Date of Birth: Jan 02, 1936  Subjective/Objective:    Pt in to r/o CVA. She is from home with relatives.               Action/Plan: Awaiting PT/OT recommendations. CM following for d/c needs, physician orders.   Expected Discharge Date:                  Expected Discharge Plan:     In-House Referral:     Discharge planning Services     Post Acute Care Choice:    Choice offered to:     DME Arranged:    DME Agency:     HH Arranged:    HH Agency:     Status of Service:  In process, will continue to follow  If discussed at Long Length of Stay Meetings, dates discussed:    Additional Comments:  Pollie Friar, RN 10/05/2016, 11:30 AM

## 2016-10-06 DIAGNOSIS — E119 Type 2 diabetes mellitus without complications: Secondary | ICD-10-CM | POA: Diagnosis not present

## 2016-10-06 DIAGNOSIS — Z86718 Personal history of other venous thrombosis and embolism: Secondary | ICD-10-CM | POA: Diagnosis not present

## 2016-10-06 DIAGNOSIS — F0390 Unspecified dementia without behavioral disturbance: Secondary | ICD-10-CM

## 2016-10-06 DIAGNOSIS — G459 Transient cerebral ischemic attack, unspecified: Secondary | ICD-10-CM | POA: Diagnosis not present

## 2016-10-06 DIAGNOSIS — G458 Other transient cerebral ischemic attacks and related syndromes: Secondary | ICD-10-CM | POA: Diagnosis not present

## 2016-10-06 DIAGNOSIS — E785 Hyperlipidemia, unspecified: Secondary | ICD-10-CM | POA: Diagnosis not present

## 2016-10-06 DIAGNOSIS — Z86711 Personal history of pulmonary embolism: Secondary | ICD-10-CM | POA: Diagnosis not present

## 2016-10-06 DIAGNOSIS — I10 Essential (primary) hypertension: Secondary | ICD-10-CM | POA: Diagnosis not present

## 2016-10-06 LAB — GLUCOSE, CAPILLARY
GLUCOSE-CAPILLARY: 244 mg/dL — AB (ref 65–99)
GLUCOSE-CAPILLARY: 260 mg/dL — AB (ref 65–99)
Glucose-Capillary: 132 mg/dL — ABNORMAL HIGH (ref 65–99)

## 2016-10-06 NOTE — Progress Notes (Addendum)
Discharge instructions given to patient and patient daughter-in-law, questions answered and signed by patient daughter-in-law.  IV removed. Tele removed. Patient dressed in home clothes, all belongings sent with patient.  Patient d/c home by wheelchair via personal car.

## 2016-10-06 NOTE — Progress Notes (Signed)
SLP Cancellation Note  Patient Details Name: [name] MRN: [id number] DOB: 1935/10/29   Cancelled treatment:       Reason Eval/Treat Not Completed: Patient at procedure or test/unavailable. Per RN, discharge is pending today.  Nyesha Cliff B. Quentin [facility], CCC-SLP [phone]  Shonna Chock 10/06/2016, 12:13 PM

## 2016-10-06 NOTE — Care Management Note (Signed)
Case Management Note  Patient Details  Name: [name] MRN: [id number] Date of Birth: 1936-01-04  Subjective/Objective:                    Action/Plan: Patient discharging home with resumption of [facility] services. Pt was active with [facility] prior to admission. CM spoke to patients daughter in law and they are happy to continue with [facility]. [name] notified of d/c.  [name] is going to provide transportation home.   Expected Discharge Date:  10/06/16               Expected Discharge Plan:  [place]  In-House Referral:     Discharge planning Services  CM Consult  Post Acute Care Choice:  Home Health Choice offered to:  Adult Children  DME Arranged:    DME Agency:     HH Arranged:  PT, OT [place] Agency:  [place]  Status of Service:  Completed, signed off  If discussed at [place] of Stay Meetings, dates discussed:    Additional Comments:  Pollie Friar, RN 10/06/2016, 3:48 PM

## 2016-10-06 NOTE — Progress Notes (Signed)
STROKE TEAM PROGRESS NOTE   SUBJECTIVE (INTERVAL HISTORY) Patient`s daughter is at bedside.. She has) history of dementia at baseline and is wheelchair-bound. She presented with transient slurred speech and facial droop which appears to resolve. MRI scan of the brain and MRA of the brain were personally reviewed and are negative for acute stroke but motion degraded.Plan is to discharge the patient home today with home therapy   OBJECTIVE Temp:  [98.4 F (36.9 C)-99.1 F (37.3 C)] 98.6 F (37 C) (05/31 0914) Pulse Rate:  [63-84] 84 (05/31 0914) Cardiac Rhythm: Normal sinus rhythm (05/31 0701) Resp:  [16-18] 18 (05/31 0914) BP: (144-173)/(68-86) 159/77 (05/31 0914) SpO2:  [99 %-100 %] 99 % (05/31 0914)  CBC:   Recent Labs Lab 10/04/16 2134 10/04/16 2212  WBC 5.8  --   NEUTROABS 2.8  --   HGB 11.6* 12.6  HCT 37.2 37.0  MCV 88.2  --   PLT 222  --     Basic Metabolic Panel:   Recent Labs Lab 10/04/16 2134 10/04/16 2212  NA 140 138  K 4.5 4.4  CL 102 101  CO2 27  --   GLUCOSE 154* 149*  BUN 18 17  CREATININE 0.52 0.50  CALCIUM 9.6  --    HgbA1c:  Lab Results  Component Value Date   HGBA1C 6.1 (H) 05/11/2016    PHYSICAL EXAM Frail elderly lady not in distress. . Afebrile. Head is nontraumatic. Neck is supple without bruit.    Cardiac exam no murmur or gallop. Lungs are clear to auscultation. Distal pulses are well felt.There is 2+ pedal edema bilaterally Neurological Exam ;  Awake  Alert oriented x 1. Diminished attention, registration and recall. Poor insight into her condition. Flight of ideas. No dysarthria but slightly hesitant speech.eye movements full without nystagmus.fundi were not visualized. Vision acuity and fields appear normal. Hearing is normal. Palatal movements are normal. Face symmetric. Tongue midline. Normal strength, tone, reflexes and coordination. Normal sensation. Gait deferred.  ASSESSMENT/PLAN Ms. Zaray Gatchel is a 81 y.o. female with  history of diabetes mellitus, hypertension, hyperlipidemia, dementia, deep vein thrombosis and pulmonary embolism on anticoagulation presenting to [facility] with transient L facial droop and L hemipareiss. She did not receive IV t-PA due to resolved deficits, on xarelto.   Right brain TIA  CT no acute stroke. Atrophy. Small vessel disease. 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 Aspects 10.    MRI  no acute infarct. Motion degraded.  MRA  no large vessel stenosis. Motion degraded. Carotid Doppler   Bilateral:  1-39% ICA stenosis.  Vertebral artery flow is antegrade.2D Echo Left ventricle: The cavity size was normal. Wall thickness was   increased in a pattern of moderate LVH. Systolic function was   normal. The estimated ejection fraction was in the range of 60%   to 65%. Wall motion was normal; there were no regional wall    motion abnormalities  LDL 90  HgbA1c pending  xarleto for VTE prophylaxis Diet heart healthy/carb modified Room service appropriate? Yes; Fluid consistency: Thin Diet - low sodium heart healthy  Xarelto (rivaroxaban) daily prior to admission, now on Xarelto (rivaroxaban) daily  Therapy recommendations:  Home PT/OT   Disposition:  pending   ? Hypercoagulable state  Multiple DVT or PE in the past   Treated with coumadin for a long time  Now on xarelto  followed by Dr. Jonette Eva   Hypertension  Elevated but Stable  Permissive hypertension (OK if < 220/120) but gradually normalize in 5-7 days Long-term  BP goal normotensive  Hyperlipidemia  Home meds:  lipitor 20, resumed in hospital  LDL 90. above goal  Continue statin at discharge  Diabetes type II  HgbA1c pending, goal < 7.0  Other Stroke Risk Factors  Advanced age  No ETOH use. Level < 5 on admission  UDS not performed  Hx stroke/TIA  05/2016 - Right brain TIA secondary to ? hypercoagulable state Erlinda Hong)  Family hx stroke (sister)  Other Active Problems  Baseline dementia, on [facility] day  # 0  I have personally examined this patient, reviewed notes, independently viewed imaging studies, participated in medical decision making and plan of care.ROS completed by me personally and pertinent positives fully documented  I have made any additions or clarifications directly to the above note. She presented with transient slurred speech, left facial droop and weakness likely related to right brain TIA. Etiology likely small vessel disease. Continue Xarelto for stroke prevention given history of DVT and pulmonary embolism discuss with daughter and Dr. Teryl Lucy Stroke team will sign off. Kindly call for questions. Antony Contras, MD Medical Director [facility] Pager: [phone] 10/06/2016 1:40 PM   To contact Stroke Continuity provider, please refer to [email or web address] After hours, contact General Neurology

## 2016-10-06 NOTE — Progress Notes (Signed)
[facility]  Patient Status: Active (receiving services up to time of hospitalization)  [facility] is providing the following services: PT and OT  If patient discharges after hours, please call [phone].   Edwinna Areola 10/06/2016, 9:43 AM

## 2016-10-06 NOTE — Discharge Summary (Addendum)
Physician Discharge Summary  [name] FTD:[id number] DOB: 04-15-1936 DOA: 10/04/2016  PCP: Gildardo Cranker, DO  Admit date: 10/04/2016 Discharge date: 10/06/2016  Admitted From: Home Disposition: Home  Recommendations for Outpatient Follow-up:  1. Follow up with PCP in 1 week 2. Follow up with neurology in 4-6 weeks 3. Follow up with cardiology in 2 weeks for atrial septal aneurysm  Home Health: Physical therapy Equipment/Devices: No new equipment  Discharge Condition: Stable CODE STATUS: Full code Diet recommendation: Heart healthy   Brief/Interim Summary:  Admission HPI written by Ivor Costa, MD   Chief Complaint: Slurred speech, left facial droop, left arm weakness  HPI: [name] is a 81 y.o. female with medical history significant of hypertension, dementia, hyperlipidemia, diabetes mellitus, dementia, TIA, stroke, chronic bilateral leg lymphedema, who presents with  slurred speech, left facial droop, left arm weakness.  Per her daughter-in-law, patient was noted to have slurred speech, left facial droop, left arm weakness at about 7:20 this evening. The symptoms lasted for about 2 hours, then gradually resolved at arrival to the emergency room. Currently patient moves all extremities. No facial droop or slurred speech. Patient denies chest pain, shortness of breath or cough. No nausea, vomiting, diarrhea, abdominal pain, diarrhea, symptoms of UTI.  ED Course: pt was found to have WBC 5.8, negative troponin, INR 1.43, PTT 37, electrolytes renal function okay, temperature normal, no tachycardia, O2 sat 99% on room air, CT head is negative for acute intracranial abnormalities. Alcohol level less than 5, pending urinalysis. Patient is placed on telemetry bed for observation. Neurology, Dr. Nicole Kindred was consulted.    Hospital course:  TIA Patient presented with transient left sided facial droop and weakness. MRI was negative for an acute CVA. Echocardiogram significant for  septal wall aneurysm. Cardiology called and will set up an appointment for the patient. Carotid dopplers significant for no evidence of extracranial ICA stenosis with antegrade vertebral artery flow.  History of PE and DVT Continue Xarelto  Diabetes mellitus Metformin at home. Sliding scale while in the hospital.  Hyperlipidemia Continue Lipitor  Essential hypertension Continued clonidine and losartan.  Discharge Diagnoses:  Principal Problem:   TIA (transient ischemic attack) Active Problems:   History of pulmonary embolism   History of DVT (deep vein thrombosis)   Diabetes mellitus type II, non insulin dependent ([place])   Hyperlipidemia   Hypertension   Dementia    Discharge Instructions  Discharge Instructions    Call MD for:  persistant nausea and vomiting    Complete by:  As directed    Diet - low sodium heart healthy    Complete by:  As directed    Increase activity slowly    Complete by:  As directed      Allergies as of 10/06/2016   No Known Allergies     Medication List    TAKE these medications   acetaminophen 500 MG tablet Commonly known as:  TYLENOL Take 500 mg by mouth every 6 (six) hours as needed.   atorvastatin 20 MG tablet Commonly known as:  LIPITOR Take 1 tablet (20 mg total) by mouth daily at 6 PM.   cholecalciferol 1000 units tablet Commonly known as:  VITAMIN D Take 5,000 Units by mouth daily.   ciprofloxacin 0.3 % ophthalmic solution Commonly known as:  CILOXAN Administer 1 drop QID x 7 days into both eyes   cloNIDine 0.1 MG tablet Commonly known as:  CATAPRES Take 1 tablet (0.1 mg total) by mouth 2 (two) times daily.  diclofenac sodium 1 % Gel Commonly known as:  VOLTAREN Apply topically 3 (three) times daily.   gabapentin 300 MG capsule Commonly known as:  NEURONTIN Take 1 capsule (300 mg total) by mouth 3 (three) times daily.   glucose blood test strip Commonly known as:  ACCU-CHEK AVIVA PLUS 1 each by Other route 2  (two) times daily. E11.9   losartan 25 MG tablet Commonly known as:  COZAAR Take 1 tablet (25 mg total) by mouth daily.   metFORMIN 1000 MG tablet Commonly known as:  GLUCOPHAGE Take 1 tablet (1,000 mg total) by mouth daily with breakfast. Take 1/2 tablet by mouth in the evening to control blood sugar   Omega 3 1200 MG Caps Take 1,200 mg by mouth daily.   OVER THE COUNTER MEDICATION Take 1 capsule by mouth daily. "ultra flore balance"   Rivaroxaban 15 MG Tabs tablet Commonly known as:  XARELTO Take 1 tablet (15 mg total) by mouth daily with supper.   VITAMIN B COMPLEX PO Take 1 tablet by mouth daily.      Follow-up Information    Gildardo Cranker, DO. Schedule an appointment as soon as possible for a visit in 1 week(s).   Specialty:  Internal Medicine Contact information: [place] [phone] [phone]        Garvin Fila, MD. Schedule an appointment as soon as possible for a visit in 4 week(s).   Specialties:  Neurology, Radiology Why:  TIA Contact information: [place] [address] [place]. Schedule an appointment as soon as possible for a visit.   Why:  Atrial septal defect Contact information: [place] [phone] [phone]         No Known Allergies  Consultations:  Neurology/Stroke team   Procedures/Studies: Ct Head Wo Contrast  Result Date: 10/04/2016 CLINICAL DATA:  81 year old female with left-sided facial drooping and slurred speech. EXAM: CT HEAD WITHOUT CONTRAST TECHNIQUE: Contiguous axial images were obtained from the base of the skull through the vertex without intravenous contrast. COMPARISON:  Head CT dated 08/23/2016 FINDINGS: Brain: There is moderate age-related atrophy and chronic microvascular ischemic changes. There is no acute intracranial hemorrhage. No mass effect  or midline shift noted. Focal area of old infarct and encephalomalacia in the left cerebellar hemisphere similar to prior CT. Vascular: No hyperdense vessel or unexpected calcification. Skull: Normal. Negative for fracture or focal lesion. Sinuses/Orbits: No acute finding. Other: None IMPRESSION: 1. No acute intracranial hemorrhage. 2. Moderate age-related atrophy and chronic microvascular ischemic changes. 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 If symptoms persist, and there are no contraindications, MRI may provide better evaluation if clinically indicated. Electronically Signed   By: Anner Crete M.D.   On: 10/04/2016 22:38   Mr Brain Wo Contrast  Result Date: 10/05/2016 CLINICAL DATA:  Slurred speech. Left facial droop. Left arm weakness. EXAM: MRI HEAD WITHOUT CONTRAST MRA HEAD WITHOUT CONTRAST TECHNIQUE: Multiplanar, multiecho pulse sequences of the brain and surrounding structures were obtained without intravenous contrast. Angiographic images of the head were obtained using MRA technique without contrast. COMPARISON:  CT 10/04/2016.  MRI 05/12/2016. FINDINGS: MRI HEAD FINDINGS Brain: Diffusion imaging does not show any acute or subacute infarction. There is old infarction within the inferior cerebellum on the left. There chronic small-vessel ischemic changes of the pons. Cerebral hemispheres show generalized atrophy with confluent chronic small vessel ischemic changes  throughout the white matter. There is a small old right parietal cortical infarction. No evidence of mass lesion, hemorrhage or extra-axial collection. Ventricular prominence most likely secondary to central atrophy. Vascular: Major vessels at the base of the brain show flow. Skull and upper cervical spine: Negative Sinuses/Orbits: Clear/normal Other: None significant MRA HEAD FINDINGS Significantly motion degraded exam. Both internal carotid arteries are patent through the skullbase. Antegrade flow seen in the anterior and middle cerebral  vessels without visible proximal stenosis. Right vertebral artery is patent to the basilar. Too much motion degradation for assessment of the left vertebral artery. No basilar stenosis is seen. Flow is present in the superior cerebellar and posterior cerebral arteries. IMPRESSION: Motion degraded exam. No sign of acute infarction. Old left inferior cerebellar infarction. Extensive chronic small vessel ischemic changes elsewhere throughout the brain. Severely degraded MR angiography appears show flow in all the major vessels. Single exception to this is absence of flow or motion degraded absence of signal in the left vertebral artery. Electronically Signed   By: Nelson Chimes M.D.   On: 10/05/2016 13:29   Mr Jodene Nam Head/brain IR Cm  Result Date: 10/05/2016 CLINICAL DATA:  Slurred speech. Left facial droop. Left arm weakness. EXAM: MRI HEAD WITHOUT CONTRAST MRA HEAD WITHOUT CONTRAST TECHNIQUE: Multiplanar, multiecho pulse sequences of the brain and surrounding structures were obtained without intravenous contrast. Angiographic images of the head were obtained using MRA technique without contrast. COMPARISON:  CT 10/04/2016.  MRI 05/12/2016. FINDINGS: MRI HEAD FINDINGS Brain: Diffusion imaging does not show any acute or subacute infarction. There is old infarction within the inferior cerebellum on the left. There chronic small-vessel ischemic changes of the pons. Cerebral hemispheres show generalized atrophy with confluent chronic small vessel ischemic changes throughout the white matter. There is a small old right parietal cortical infarction. No evidence of mass lesion, hemorrhage or extra-axial collection. Ventricular prominence most likely secondary to central atrophy. Vascular: Major vessels at the base of the brain show flow. Skull and upper cervical spine: Negative Sinuses/Orbits: Clear/normal Other: None significant MRA HEAD FINDINGS Significantly motion degraded exam. Both internal carotid arteries are patent  through the skullbase. Antegrade flow seen in the anterior and middle cerebral vessels without visible proximal stenosis. Right vertebral artery is patent to the basilar. Too much motion degradation for assessment of the left vertebral artery. No basilar stenosis is seen. Flow is present in the superior cerebellar and posterior cerebral arteries. IMPRESSION: Motion degraded exam. No sign of acute infarction. Old left inferior cerebellar infarction. Extensive chronic small vessel ischemic changes elsewhere throughout the brain. Severely degraded MR angiography appears show flow in all the major vessels. Single exception to this is absence of flow or motion degraded absence of signal in the left vertebral artery. Electronically Signed   By: Nelson Chimes M.D.   On: 10/05/2016 13:29     Echocardiogram (10/05/16)  Study Conclusions  - Left ventricle: The cavity size was normal. Wall thickness was   increased in a pattern of moderate LVH. Systolic function was   normal. The estimated ejection fraction was in the range of 60%   to 65%. Wall motion was normal; there were no regional wall   motion abnormalities. Doppler parameters are consistent with   abnormal left ventricular relaxation (grade 1 diastolic   dysfunction). - Mitral valve: Calcified annulus. - Atrial septum: There was an atrial septal aneurysm. - Pericardium, extracardiac: A trivial pericardial effusion was   identified.  Impressions:  - Normal LV systolic function; mild  diastolic dysfunction; moderate   LVH; atrial septal aneurysm.   Subjective: Patient reports no chest pain or dyspnea. Demented.  Discharge Exam: Vitals:   10/06/16 0629 10/06/16 0914  BP: (!) 144/86 (!) 159/77  Pulse: 74 84  Resp: 18 18  Temp: 98.8 F (37.1 C) 98.6 F (37 C)   Vitals:   10/05/16 2202 10/06/16 0217 10/06/16 0629 10/06/16 0914  BP: (!) 155/69 (!) 173/68 (!) 144/86 (!) 159/77  Pulse: 63 64 74 84  Resp: 16 18 18 18   Temp: 98.4 F  (36.9 C) 99.1 F (37.3 C) 98.8 F (37.1 C) 98.6 F (37 C)  TempSrc: Oral Oral Oral Oral  SpO2: 100% 100% 99% 99%  Weight:      Height:        General: Pt is alert, awake, not in acute distress Cardiovascular: RRR, S1/S2 +, no rubs, no gallops Respiratory: CTA bilaterally, no wheezing, no rhonchi Abdominal: Soft, NT, ND, bowel sounds + Extremities: no edema, no cyanosis  Psych: oriented to person only    The results of significant diagnostics from this hospitalization (including imaging, microbiology, ancillary and laboratory) are listed below for reference.      Labs: Basic Metabolic Panel:  Recent Labs Lab 10/04/16 2134 10/04/16 2212  NA 140 138  K 4.5 4.4  CL 102 101  CO2 27  --   GLUCOSE 154* 149*  BUN 18 17  CREATININE 0.52 0.50  CALCIUM 9.6  --    CBC:  Recent Labs Lab 10/04/16 2134 10/04/16 2212  WBC 5.8  --   NEUTROABS 2.8  --   HGB 11.6* 12.6  HCT 37.2 37.0  MCV 88.2  --   PLT 222  --    CBG:  Recent Labs Lab 10/05/16 1118 10/05/16 1640 10/05/16 2159 10/06/16 0654 10/06/16 1219  GLUCAP 146* 188* 217* 132* 244*   Urinalysis    Component Value Date/Time   COLORURINE YELLOW 08/23/2016 1949   APPEARANCEUR HAZY (A) 08/23/2016 1949   LABSPEC 1.012 08/23/2016 1949   PHURINE 8.0 08/23/2016 1949   GLUCOSEU 150 (A) 08/23/2016 1949   HGBUR SMALL (A) 08/23/2016 1949   BILIRUBINUR NEGATIVE 08/23/2016 1949   KETONESUR 5 (A) 08/23/2016 1949   PROTEINUR 30 (A) 08/23/2016 1949   NITRITE POSITIVE (A) 08/23/2016 1949   LEUKOCYTESUR TRACE (A) 08/23/2016 1949    SIGNED:   Cordelia Poche, MD [facility] 10/06/2016, 12:37 PM Pager [phone]  If 7PM-7AM, please contact night-coverage [email or web address] Password [email or web address]

## 2016-10-07 ENCOUNTER — Telehealth: Payer: Self-pay

## 2016-10-07 LAB — HEMOGLOBIN A1C
HEMOGLOBIN A1C: 9.6 % — AB (ref 4.8–5.6)
MEAN PLASMA GLUCOSE: 229 mg/dL

## 2016-10-07 NOTE — Telephone Encounter (Signed)
I have made the 1st attempt to contact the patient or family member in charge, in order to follow up from recently being discharged from the hospital. I left a message on voicemail but I will make another attempt at a different time.  

## 2016-10-11 ENCOUNTER — Ambulatory Visit (HOSPITAL_BASED_OUTPATIENT_CLINIC_OR_DEPARTMENT_OTHER): Payer: Medicare Other | Admitting: Hematology & Oncology

## 2016-10-11 ENCOUNTER — Other Ambulatory Visit: Payer: Self-pay | Admitting: *Deleted

## 2016-10-11 ENCOUNTER — Other Ambulatory Visit (HOSPITAL_BASED_OUTPATIENT_CLINIC_OR_DEPARTMENT_OTHER): Payer: Medicare Other

## 2016-10-11 VITALS — BP 143/70 | HR 86 | Temp 98.5°F | Resp 18

## 2016-10-11 DIAGNOSIS — I2699 Other pulmonary embolism without acute cor pulmonale: Secondary | ICD-10-CM | POA: Diagnosis not present

## 2016-10-11 DIAGNOSIS — Z7901 Long term (current) use of anticoagulants: Secondary | ICD-10-CM

## 2016-10-11 DIAGNOSIS — Z8673 Personal history of transient ischemic attack (TIA), and cerebral infarction without residual deficits: Secondary | ICD-10-CM | POA: Diagnosis not present

## 2016-10-11 DIAGNOSIS — I82409 Acute embolism and thrombosis of unspecified deep veins of unspecified lower extremity: Secondary | ICD-10-CM | POA: Diagnosis not present

## 2016-10-11 DIAGNOSIS — D510 Vitamin B12 deficiency anemia due to intrinsic factor deficiency: Secondary | ICD-10-CM

## 2016-10-11 DIAGNOSIS — D508 Other iron deficiency anemias: Secondary | ICD-10-CM

## 2016-10-11 DIAGNOSIS — D649 Anemia, unspecified: Secondary | ICD-10-CM

## 2016-10-11 DIAGNOSIS — Z86711 Personal history of pulmonary embolism: Secondary | ICD-10-CM

## 2016-10-11 LAB — CBC WITH DIFFERENTIAL (CANCER CENTER ONLY)
BASO#: 0 10*3/uL (ref 0.0–0.2)
BASO%: 0.2 % (ref 0.0–2.0)
EOS ABS: 0 10*3/uL (ref 0.0–0.5)
EOS%: 0.7 % (ref 0.0–7.0)
HEMATOCRIT: 33.3 % — AB (ref 34.8–46.6)
HGB: 10.5 g/dL — ABNORMAL LOW (ref 11.6–15.9)
LYMPH#: 1.6 10*3/uL (ref 0.9–3.3)
LYMPH%: 27.1 % (ref 14.0–48.0)
MCH: 28.1 pg (ref 26.0–34.0)
MCHC: 31.5 g/dL — AB (ref 32.0–36.0)
MCV: 89 fL (ref 81–101)
MONO#: 0.7 10*3/uL (ref 0.1–0.9)
MONO%: 12.4 % (ref 0.0–13.0)
NEUT#: 3.4 10*3/uL (ref 1.5–6.5)
NEUT%: 59.6 % (ref 39.6–80.0)
Platelets: 202 10*3/uL (ref 145–400)
RBC: 3.74 10*6/uL (ref 3.70–5.32)
RDW: 13.4 % (ref 11.1–15.7)
WBC: 5.7 10*3/uL (ref 3.9–10.0)

## 2016-10-11 LAB — CMP (CANCER CENTER ONLY)
ALBUMIN: 2.9 g/dL — AB (ref 3.3–5.5)
ALT(SGPT): 16 U/L (ref 10–47)
AST: 17 U/L (ref 11–38)
Alkaline Phosphatase: 56 U/L (ref 26–84)
BILIRUBIN TOTAL: 0.6 mg/dL (ref 0.20–1.60)
BUN, Bld: 11 mg/dL (ref 7–22)
CHLORIDE: 99 meq/L (ref 98–108)
CO2: 28 meq/L (ref 18–33)
Calcium: 9.4 mg/dL (ref 8.0–10.3)
Creat: 0.5 mg/dl — ABNORMAL LOW (ref 0.6–1.2)
GLUCOSE: 232 mg/dL — AB (ref 73–118)
Potassium: 4.3 mEq/L (ref 3.3–4.7)
Sodium: 140 mEq/L (ref 128–145)
Total Protein: 6.7 g/dL (ref 6.4–8.1)

## 2016-10-11 NOTE — Progress Notes (Signed)
Hematology and Oncology Follow Up Visit  [name] [id number] 03-20-1936 81 y.o. 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 is back for follow-up. She is not doing as well as. She apparently was hospitalized last week. Selection may have had a TIA. She went to the emergency room. She had a full workup. Her MRI, unfortunately was of low quality because of motion artifact. Her echocardiogram looked okay. She had carotid Dopplers which did not show any significant carotid stenosis. She had a normal thyroid function study.  Her daughter-in-law says that prior to this happening, she had a lot of sweating. She is diabetic. I will know if her blood sugars were low. When she was hospitalized, she had good blood sugars.  She is quite fatigued today. She was fallen asleep during our office visit.  She sees be doing okay with Xarelto. She's had no bleeding.  She does have some leg swelling.  Her appetite is okay. She really does not talk much. She comes in a wheelchair.  Overall, [name] a performance status is ECOG 3 at best.   Medications:  Current Outpatient Prescriptions:  .  acetaminophen (TYLENOL) 500 MG tablet, Take 500 mg by mouth every 6 (six) hours as needed., Disp: , Rfl:  .  atorvastatin (LIPITOR) 20 MG tablet, Take 1 tablet (20 mg total) by mouth daily at 6 PM., Disp: 90 tablet, Rfl: 1 .  B Complex Vitamins (VITAMIN B COMPLEX PO), Take 1 tablet by mouth daily. , Disp: , Rfl:  .  cholecalciferol (VITAMIN D) 1000 units tablet, Take 5,000 Units by mouth daily. , Disp: , Rfl:  .  ciprofloxacin (CILOXAN) 0.3 % ophthalmic solution, Administer 1 drop QID x 7 days into both eyes, Disp: 5 mL, Rfl: 1 .  cloNIDine (CATAPRES) 0.1 MG tablet, Take 1 tablet (0.1 mg total) by mouth 2 (two) times daily., Disp: 180 tablet, Rfl: 1 .   diclofenac sodium (VOLTAREN) 1 % GEL, Apply topically 3 (three) times daily., Disp: , Rfl:  .  gabapentin (NEURONTIN) 300 MG capsule, Take 1 capsule (300 mg total) by mouth 3 (three) times daily., Disp: 270 capsule, Rfl: 1 .  glucose blood (ACCU-CHEK AVIVA PLUS) test strip, 1 each by Other route 2 (two) times daily. E11.9, Disp: 100 each, Rfl: 5 .  losartan (COZAAR) 25 MG tablet, Take 1 tablet (25 mg total) by mouth daily., Disp: 90 tablet, Rfl: 1 .  metFORMIN (GLUCOPHAGE) 1000 MG tablet, Take 1 tablet (1,000 mg total) by mouth daily with breakfast. Take 1/2 tablet by mouth in the evening to control blood sugar, Disp: 135 tablet, Rfl: 1 .  Omega 3 1200 MG CAPS, Take 1,200 mg by mouth daily. , Disp: , Rfl:  .  OVER THE COUNTER MEDICATION, Take 1 capsule by mouth daily. "ultra flore balance", Disp: , Rfl:  .  Rivaroxaban (XARELTO) 15 MG TABS tablet, Take 1 tablet (15 mg total) by mouth daily with supper., Disp: 90 tablet, Rfl: 1  Allergies: No Known Allergies  Past Medical History, Surgical history, Social history, and Family History were reviewed and updated.  Review of Systems:  As above  Physical Exam:  oral temperature is 98.5 F (36.9 C). Her blood pressure is 143/70 (abnormal) and her pulse is 86. Her respiration is 18 and oxygen saturation is 100%.  Wt Readings from Last 3 Encounters:  10/05/16 128 lb 12 oz (58.4 kg)  08/23/16 123 lb 7.3 oz (56 kg)  06/25/16 110 lb (49.9 kg)      Elderly African-American female in no obvious distress. Head exam shows no ocular or oral lesions. She has no palpable cervical or supraclavicular lymph nodes. Lungs are clear bilaterally. Cardiac exam regular rate and rhythm with occasional extra beat. Abdomen is soft. She has good bowel sounds. There is no fluid wave. There is no palpable liver or spleen tip. Back exam shows no tenderness over the spine, ribs or hips. Extremities shows some chronic edema in her lower legs. She has some venous stasis in the  lower legs. She has no obvious venous cord. Neurological exam shows no focal neurological deficits.  Lab Results  Component Value Date   WBC 5.7 10/11/2016   HGB 10.5 (L) 10/11/2016   HCT 33.3 (L) 10/11/2016   MCV 89 10/11/2016   PLT 202 10/11/2016     Chemistry      Component Value Date/Time   NA 138 10/04/2016 2212   NA 133 (L) 07/13/2016 1458   NA 140 06/01/2016 1400   K 4.4 10/04/2016 2212   K 4.3 07/13/2016 1458   K 4.1 06/01/2016 1400   CL 101 10/04/2016 2212   CL 98 07/13/2016 1458   CL 103 06/01/2016 1400   CO2 27 10/04/2016 2134   CO2 25 07/13/2016 1458   CO2 27 06/01/2016 1400   BUN 17 10/04/2016 2212   BUN 24 07/13/2016 1458   BUN 21 06/01/2016 1400   CREATININE 0.50 10/04/2016 2212   CREATININE 0.53 (L) 09/26/2016 0843   GLU 140 03/30/2016      Component Value Date/Time   CALCIUM 9.6 10/04/2016 2134   CALCIUM 9.9 07/13/2016 1458   CALCIUM 9.2 06/01/2016 1400   ALKPHOS 65 08/23/2016 1708   ALKPHOS 77 07/13/2016 1458   ALKPHOS 64 06/01/2016 1400   AST 20 08/23/2016 1708   AST 12 07/13/2016 1458   AST 19 06/01/2016 1400   ALT 15 08/23/2016 1708   ALT 11 07/13/2016 1458   ALT 25 06/01/2016 1400   BILITOT 0.7 08/23/2016 1708   BILITOT <0.2 07/13/2016 1458   BILITOT 0.60 06/01/2016 1400         Impression and Plan: [name] is a 81 year old African American female. She has history of thromboembolic disease. I think all of her tests come back normal. Regardless, she will need lifelong anticoagulation.   I'm not sure as to what is going on with her. We try to get some iron studies on her. Unfortunately she just has very poor venous access and we cannot do this.   She sees her family doctor in 2 days. I gave her a prescription to her daughter-in-law to give to her family doctor to see if they can draw iron studies.   I also thought about a possible urinary tract infection. Unfortunately we cannot do in and out catheterization here.   Another a  recent article in the [place] [facility] showed that Vance really does not have an impact on recurrent cerebrovascular disease of unclear etiology. As such, maybe she did have another cerebrovascular event.  I think she sees the neurologist in a week or so.   We spent about 35 minutes with her today.  [name], MD 6/5/201811:53 AM

## 2016-10-12 NOTE — Telephone Encounter (Signed)
I have made the 2nd attempt to contact the patient or family member in charge, in order to follow up from recently being discharged from the hospital. I left a message on voicemail but I will make another attempt at a different time.  

## 2016-10-13 ENCOUNTER — Encounter: Payer: Self-pay | Admitting: Nurse Practitioner

## 2016-10-13 ENCOUNTER — Ambulatory Visit
Admission: RE | Admit: 2016-10-13 | Discharge: 2016-10-13 | Disposition: A | Discharge status: Home / Self Care | Payer: Medicare Other | Source: Ambulatory Visit | Attending: Nurse Practitioner | Admitting: Nurse Practitioner

## 2016-10-13 ENCOUNTER — Ambulatory Visit (INDEPENDENT_AMBULATORY_CARE_PROVIDER_SITE_OTHER): Payer: Medicare Other | Admitting: Nurse Practitioner

## 2016-10-13 VITALS — BP 162/84 | HR 90 | Temp 99.2°F | Resp 18

## 2016-10-13 DIAGNOSIS — G458 Other transient cerebral ischemic attacks and related syndromes: Secondary | ICD-10-CM | POA: Diagnosis not present

## 2016-10-13 DIAGNOSIS — E1142 Type 2 diabetes mellitus with diabetic polyneuropathy: Secondary | ICD-10-CM

## 2016-10-13 DIAGNOSIS — Z86718 Personal history of other venous thrombosis and embolism: Secondary | ICD-10-CM

## 2016-10-13 DIAGNOSIS — R509 Fever, unspecified: Secondary | ICD-10-CM

## 2016-10-13 DIAGNOSIS — D509 Iron deficiency anemia, unspecified: Secondary | ICD-10-CM | POA: Diagnosis not present

## 2016-10-13 DIAGNOSIS — F0390 Unspecified dementia without behavioral disturbance: Secondary | ICD-10-CM | POA: Diagnosis not present

## 2016-10-13 DIAGNOSIS — R05 Cough: Secondary | ICD-10-CM | POA: Diagnosis not present

## 2016-10-13 LAB — CBC WITH DIFFERENTIAL/PLATELET
Basophils Absolute: 44 cells/uL (ref 0–200)
Basophils Relative: 1 %
Eosinophils Absolute: 44 cells/uL (ref 15–500)
Eosinophils Relative: 1 %
HCT: 33.5 % — ABNORMAL LOW (ref 35.0–45.0)
Hemoglobin: 10.5 g/dL — ABNORMAL LOW (ref 11.7–15.5)
LYMPHS PCT: 27 %
Lymphs Abs: 1188 cells/uL (ref 850–3900)
MCH: 27.3 pg (ref 27.0–33.0)
MCHC: 31.3 g/dL — AB (ref 32.0–36.0)
MCV: 87 fL (ref 80.0–100.0)
MONOS PCT: 15 %
MPV: 9 fL (ref 7.5–12.5)
Monocytes Absolute: 660 cells/uL (ref 200–950)
NEUTROS PCT: 56 %
Neutro Abs: 2464 cells/uL (ref 1500–7800)
PLATELETS: 235 10*3/uL (ref 140–400)
RBC: 3.85 MIL/uL (ref 3.80–5.10)
RDW: 14.2 % (ref 11.0–15.0)
WBC: 4.4 10*3/uL (ref 3.8–10.8)

## 2016-10-13 LAB — IRON AND TIBC
%SAT: 26 % (ref 11–50)
IRON: 66 ug/dL (ref 45–160)
TIBC: 251 ug/dL (ref 250–450)
UIBC: 185 ug/dL

## 2016-10-13 MED ORDER — METFORMIN HCL 1000 MG PO TABS: 1000.0000 mg | ORAL_TABLET | Freq: Two times a day (BID) | ORAL | 1 refills | Status: DC

## 2016-10-13 NOTE — Patient Instructions (Addendum)
To get chest xray  Will send urine off for culture Will get blood work today

## 2016-10-13 NOTE — Progress Notes (Addendum)
Careteam: Patient Care Team: Gildardo Cranker, DO as PCP - General (Internal Medicine) Marin Olp Rudell Cobb, MD as Consulting Physician (Oncology)  Advanced Directive information Does Patient Have a Medical Advance Directive?: Yes, Type of Advance Directive: [place];Living will  No Known Allergies  Chief Complaint  Patient presents with  . Hospitalization Follow-up    Pt is being seen due to a recent stay at [facility] from 10/04/16 to 10/06/16 due to TIA.   . Other    [name] in room. Dr. Marin Olp asked that a ferritin, Iron, and TIBC be drawn on pt. Labs have been ordered by requesting provider.     HPI: Patient is a 81 y.o. female seen in the office today for hospital follow up. Pt with hx of hypertension, dementia,hyperlipidemia, diabetes mellitus, dementia, TIA, stroke, chronic bilateral leg lymphedema, who went to hospital withslurred speech, left facial droop, left arm weakness. Symptoms gradual improved after arrival to ED. MRI was negative for an acute CVA. Echocardiogram significant for septal wall aneurysm. Cardiology consulted. Carotid dopplers significant for no evidence of extracranial ICA stenosis with antegrade vertebral artery flow. Pt remains on xarelto.  Pt followed up with hematology on 10/11/16 and request for iron studies to be done in office due to iron def anemia.  -bp 150/70-80 -blood sugars- prior to hospitalization were 120-130, last week 160s-190  When she got home everything was fine for several days but when they woke up over the weekend she had a diaphoretic episodes and has been weaker and more fatigue since but better today.   Yesterday started to have a cough, increase nasal congestion and nose has been running.  Urine was brown but unsure if this was due to stool being mixed in. She is incontinent of bowel and bladder.    Review of Systems:  Review of Systems  Constitutional: Positive for fever and malaise/fatigue. Negative for  chills.  HENT: Negative for congestion and hearing loss.   Eyes: Negative for blurred vision.  Respiratory: Positive for cough. Negative for sputum production, shortness of breath and wheezing.   Cardiovascular: Positive for leg swelling (chronic). Negative for chest pain and palpitations.  Gastrointestinal: Negative for abdominal pain, constipation and diarrhea.  Genitourinary: Negative for dysuria.  Neurological: Positive for weakness. Negative for dizziness and loss of consciousness.  Psychiatric/Behavioral: Positive for memory loss.    Past Medical History:  Diagnosis Date  . Dementia   . Diabetes mellitus type II 03/21/2011  . DVT (deep venous thrombosis) ([place]) 03/21/2011  . Hyperlipidemia 03/21/2011  . Hypertension 03/21/2011  . Lymphedema of leg 03/21/2011  . Pulmonary embolism ([place]) 03/21/2011   Past Surgical History:  Procedure Laterality Date  . CHOLECYSTECTOMY  2015  . SKIN TAG REMOVAL  07/25/2016  . VAGINAL HYSTERECTOMY     unknown of date  . VASCULAR SURGERY     Social History:   reports that she has never smoked. She has never used smokeless tobacco. She reports that she does not drink alcohol or use drugs.  Family History  Problem Relation Age of Onset  . Heart attack Mother   . COPD Sister   . Stroke Sister   . Bipolar disorder Daughter     Medications: Patient's Medications  New Prescriptions   No medications on file  Previous Medications   ACETAMINOPHEN (TYLENOL) 500 MG TABLET    Take 500 mg by mouth every 6 (six) hours as needed.   ATORVASTATIN (LIPITOR) 20 MG TABLET  Take 1 tablet (20 mg total) by mouth daily at 6 PM.   B COMPLEX VITAMINS (VITAMIN B COMPLEX PO)    Take 1 tablet by mouth daily.    CHOLECALCIFEROL (VITAMIN D) 1000 UNITS TABLET    Take 5,000 Units by mouth daily.    CLONIDINE (CATAPRES) 0.1 MG TABLET    Take 1 tablet (0.1 mg total) by mouth 2 (two) times daily.   DICLOFENAC SODIUM (VOLTAREN) 1 % GEL    Apply topically 3 (three)  times daily.   GABAPENTIN (NEURONTIN) 300 MG CAPSULE    Take 1 capsule (300 mg total) by mouth 3 (three) times daily.   GLUCOSE BLOOD (ACCU-CHEK AVIVA PLUS) TEST STRIP    1 each by Other route 2 (two) times daily. E11.9   LOSARTAN (COZAAR) 25 MG TABLET    Take 1 tablet (25 mg total) by mouth daily.   METFORMIN (GLUCOPHAGE) 1000 MG TABLET    Take 1 tablet (1,000 mg total) by mouth daily with breakfast. Take 1/2 tablet by mouth in the evening to control blood sugar   OVER THE COUNTER MEDICATION    Take 1 capsule by mouth daily. "ultra flore balance"   RIVAROXABAN (XARELTO) 15 MG TABS TABLET    Take 1 tablet (15 mg total) by mouth daily with supper.  Modified Medications   No medications on file  Discontinued Medications   CIPROFLOXACIN (CILOXAN) 0.3 % OPHTHALMIC SOLUTION    Administer 1 drop QID x 7 days into both eyes   OMEGA 3 1200 MG CAPS    Take 1,200 mg by mouth daily.      Physical Exam:  Vitals:   10/13/16 1432  BP: (!) 162/84  Pulse: 90  Resp: 18  Temp: 99.2 F (37.3 C)  TempSrc: Oral  SpO2: 97%   There is no height or weight on file to calculate BMI.  Physical Exam  Constitutional: She appears well-developed.  Frail appearing in NAD; sitting in w/c  HENT:  Mouth/Throat: Oropharynx is clear and moist. No oropharyngeal exudate.  Eyes: Pupils are equal, round, and reactive to light. Right eye exhibits discharge. Left eye exhibits discharge. No scleral icterus.  B/l clear d/c with conjunctival injection  Neck: Neck supple. Carotid bruit is not present. No tracheal deviation present. No thyromegaly present.  Cardiovascular: Normal rate, regular rhythm and intact distal pulses.  Exam reveals no gallop and no friction rub.   Murmur (1/6 SEM) heard. +1 pitting LLE edema; trace RLE edema; no calf TTP  Pulmonary/Chest: Effort normal and breath sounds normal. No stridor. No respiratory distress. She has no wheezes. She has no rales.  Abdominal: Soft. Bowel sounds are normal. She  exhibits no distension and no mass. There is no hepatomegaly. There is no tenderness. There is no rebound and no guarding.  Musculoskeletal: She exhibits edema and deformity.  Lymphadenopathy:    She has no cervical adenopathy.  Neurological: She is alert.  Skin: Skin is warm and dry. No rash noted.  Psychiatric: Cognition and memory are impaired. She exhibits abnormal recent memory.    Labs reviewed: Basic Metabolic Panel:  Recent Labs  05/13/16 1009  08/25/16 0403  09/26/16 0843 10/04/16 2134 10/04/16 2212 10/11/16 1113  NA  --   < > 136  < > 140 140 138 140  K  --   < > 3.5  < > 4.5 4.5 4.4 4.3  CL  --   < > 102  < > 102 102 101 99  CO2  --   < >  26  < > 26 27  --  28  GLUCOSE  --   < > 214*  < > 138* 154* 149* 232*  BUN  --   < > 9  < > 11 18 17 11   CREATININE  --   < > 0.44  < > 0.53* 0.52 0.50 0.5*  CALCIUM  --   < > 8.2*  < > 9.4 9.6  --  9.4  TSH 2.831  --  1.986  --  3.37  --   --   --   < > = values in this interval not displayed. Liver Function Tests:  Recent Labs  07/13/16 1458 08/23/16 1708 10/11/16 1113  AST 12 20 17   ALT 11 15 16   ALKPHOS 77 65 56  BILITOT <0.2 0.7 0.60  PROT 6.9 7.7 6.7  ALBUMIN 3.6 3.5 2.9*   No results for input(s): LIPASE, AMYLASE in the last 8760 hours. No results for input(s): AMMONIA in the last 8760 hours. CBC:  Recent Labs  09/01/16 1425 10/04/16 2134 10/04/16 2212 10/11/16 1113  WBC 5.2 5.8  --  5.7  NEUTROABS 3,172 2.8  --  3.4  HGB 10.0* 11.6* 12.6 10.5*  HCT 33.1* 37.2 37.0 33.3*  MCV 88.5 88.2  --  89  PLT 347 222  --  202   Lipid Panel:  Recent Labs  03/30/16 05/12/16 0723 09/26/16 0843  CHOL 143 148 155  HDL 51 43 53  LDLCALC 80 96 90  TRIG 123 46 62  CHOLHDL  --  3.4 2.9   TSH:  Recent Labs  05/13/16 1009 08/25/16 0403 09/26/16 0843  TSH 2.831 1.986 3.37   A1C: Lab Results  Component Value Date   HGBA1C 9.6 (H) 10/04/2016     Assessment/Plan 1. Other specified transient cerebral  ischemias Pt was back to baseline in ED. Echo revealed septal wall aneurysm and Plans to follow up with neurology and cardiology outpatient.   2. Dementia without behavioral disturbance, unspecified dementia type Stable, mental and functional status at baseline  3. History of DVT (deep vein thrombosis) -conts on xarelto.   4. Fever, unspecified fever cause Pt with cough, fever and fatigue. Will get lab work at this time. - may use robitussin DM as needed for cough - lungs clear but due to CVA and TIA will get DG Chest 2 View to rule out pneumonia.  -Urine for UA C&S sent via I&O cath due to fecal and bladder incontinence. Urine was clear yellow.  -follow up precautions discussed with daughter and when to go to the ED, voices understanding.   5. Type 2 diabetes mellitus with diabetic polyneuropathy, without long-term current use of insulin (HCC) -A1c done in hospital, reviewed with pt and daughter, she had increased metformin to 1000 mg twice daily and blood sugars had improved.  - metFORMIN (GLUCOPHAGE) 1000 MG tablet; Take 1 tablet (1,000 mg total) by mouth 2 (two) times daily with a meal.  Dispense: 180 tablet; Refill: 1  6. Iron deficiency anemia, unspecified iron deficiency anemia type - Ferritin - Iron and TIBC - CBC with Differential/Platelets   Total time 40 mins:  time greater than 50% of total time spent doing pt counseling and coordination of care.   To keep follow up with Dr Eulas Post, sooner if needed [place] American. Harle Battiest  [facility] [phone] 8 am - 5 pm) [phone] (after hours)

## 2016-10-14 LAB — URINE CULTURE: Organism ID, Bacteria: NO GROWTH

## 2016-10-14 LAB — URINALYSIS
Bilirubin Urine: NEGATIVE
Hgb urine dipstick: NEGATIVE
KETONES UR: NEGATIVE
Leukocytes, UA: NEGATIVE
NITRITE: NEGATIVE
PH: 5.5 (ref 5.0–8.0)
SPECIFIC GRAVITY, URINE: 1.024 (ref 1.001–1.035)

## 2016-10-14 LAB — FERRITIN: Ferritin: 45 ng/mL (ref 20–288)

## 2016-10-17 ENCOUNTER — Telehealth: Payer: Self-pay

## 2016-10-17 ENCOUNTER — Other Ambulatory Visit: Payer: Self-pay | Admitting: Nurse Practitioner

## 2016-10-17 DIAGNOSIS — G458 Other transient cerebral ischemic attacks and related syndromes: Secondary | ICD-10-CM

## 2016-10-17 NOTE — Telephone Encounter (Signed)
Called [name], to let her know that Janett Billow put the referral in. She should get a call from them within a few days. She appreciated the call back.

## 2016-10-17 NOTE — Telephone Encounter (Signed)
Referral placed.

## 2016-10-17 NOTE — Telephone Encounter (Signed)
When discharged from hospital was told that patient needs to see Cardiologist. [name] said she called to make the appointment but was told she needed her doctor do the referral. Will forward note to [name], NP to put referral in.

## 2016-10-20 DIAGNOSIS — Z029 Encounter for administrative examinations, unspecified: Secondary | ICD-10-CM

## 2016-10-24 NOTE — Addendum Note (Signed)
Addended by: [name] on: 10/24/2016 01:04 PM   Modules accepted: Level of Service

## 2016-11-08 ENCOUNTER — Ambulatory Visit (HOSPITAL_BASED_OUTPATIENT_CLINIC_OR_DEPARTMENT_OTHER)
Admission: RE | Admit: 2016-11-08 | Discharge: 2016-11-08 | Disposition: A | Discharge status: Home / Self Care | Payer: Medicare Other | Source: Ambulatory Visit | Attending: Hematology & Oncology | Admitting: Hematology & Oncology

## 2016-11-08 ENCOUNTER — Ambulatory Visit (HOSPITAL_BASED_OUTPATIENT_CLINIC_OR_DEPARTMENT_OTHER): Payer: Medicare Other | Admitting: Hematology & Oncology

## 2016-11-08 ENCOUNTER — Other Ambulatory Visit (HOSPITAL_BASED_OUTPATIENT_CLINIC_OR_DEPARTMENT_OTHER): Payer: Medicare Other

## 2016-11-08 ENCOUNTER — Encounter: Payer: Self-pay | Admitting: *Deleted

## 2016-11-08 VITALS — BP 150/76 | HR 80 | Temp 98.6°F | Resp 18

## 2016-11-08 DIAGNOSIS — Z86718 Personal history of other venous thrombosis and embolism: Secondary | ICD-10-CM

## 2016-11-08 DIAGNOSIS — I700 Atherosclerosis of aorta: Secondary | ICD-10-CM | POA: Insufficient documentation

## 2016-11-08 DIAGNOSIS — D500 Iron deficiency anemia secondary to blood loss (chronic): Secondary | ICD-10-CM | POA: Insufficient documentation

## 2016-11-08 DIAGNOSIS — J449 Chronic obstructive pulmonary disease, unspecified: Secondary | ICD-10-CM | POA: Diagnosis not present

## 2016-11-08 DIAGNOSIS — I639 Cerebral infarction, unspecified: Secondary | ICD-10-CM

## 2016-11-08 DIAGNOSIS — R059 Cough, unspecified: Secondary | ICD-10-CM

## 2016-11-08 DIAGNOSIS — Z7901 Long term (current) use of anticoagulants: Secondary | ICD-10-CM | POA: Diagnosis not present

## 2016-11-08 DIAGNOSIS — D508 Other iron deficiency anemias: Secondary | ICD-10-CM

## 2016-11-08 DIAGNOSIS — Z86711 Personal history of pulmonary embolism: Secondary | ICD-10-CM

## 2016-11-08 DIAGNOSIS — Z862 Personal history of diseases of the blood and blood-forming organs and certain disorders involving the immune mechanism: Secondary | ICD-10-CM

## 2016-11-08 DIAGNOSIS — Z8673 Personal history of transient ischemic attack (TIA), and cerebral infarction without residual deficits: Secondary | ICD-10-CM

## 2016-11-08 DIAGNOSIS — D510 Vitamin B12 deficiency anemia due to intrinsic factor deficiency: Secondary | ICD-10-CM

## 2016-11-08 DIAGNOSIS — R05 Cough: Secondary | ICD-10-CM

## 2016-11-08 LAB — CMP (CANCER CENTER ONLY)
ALT(SGPT): 9 U/L — ABNORMAL LOW (ref 10–47)
AST: 18 U/L (ref 11–38)
Albumin: 2.9 g/dL — ABNORMAL LOW (ref 3.3–5.5)
Alkaline Phosphatase: 64 U/L (ref 26–84)
BUN, Bld: 11 mg/dL (ref 7–22)
CALCIUM: 9.1 mg/dL (ref 8.0–10.3)
CHLORIDE: 107 meq/L (ref 98–108)
CO2: 27 meq/L (ref 18–33)
Creat: 0.5 mg/dl — ABNORMAL LOW (ref 0.6–1.2)
Glucose, Bld: 220 mg/dL — ABNORMAL HIGH (ref 73–118)
Potassium: 3.9 mEq/L (ref 3.3–4.7)
Sodium: 138 mEq/L (ref 128–145)
Total Bilirubin: 0.5 mg/dl (ref 0.20–1.60)
Total Protein: 6.7 g/dL (ref 6.4–8.1)

## 2016-11-08 LAB — CBC WITH DIFFERENTIAL (CANCER CENTER ONLY)
BASO#: 0 10*3/uL (ref 0.0–0.2)
BASO%: 0.4 % (ref 0.0–2.0)
EOS ABS: 0 10*3/uL (ref 0.0–0.5)
EOS%: 0.8 % (ref 0.0–7.0)
HEMATOCRIT: 34.4 % — AB (ref 34.8–46.6)
HGB: 10.9 g/dL — ABNORMAL LOW (ref 11.6–15.9)
LYMPH#: 1.6 10*3/uL (ref 0.9–3.3)
LYMPH%: 32.7 % (ref 14.0–48.0)
MCH: 28.1 pg (ref 26.0–34.0)
MCHC: 31.7 g/dL — AB (ref 32.0–36.0)
MCV: 89 fL (ref 81–101)
MONO#: 0.5 10*3/uL (ref 0.1–0.9)
MONO%: 11.3 % (ref 0.0–13.0)
NEUT%: 54.8 % (ref 39.6–80.0)
NEUTROS ABS: 2.6 10*3/uL (ref 1.5–6.5)
Platelets: 206 10*3/uL (ref 145–400)
RBC: 3.88 10*6/uL (ref 3.70–5.32)
RDW: 13.2 % (ref 11.1–15.7)
WBC: 4.8 10*3/uL (ref 3.9–10.0)

## 2016-11-08 NOTE — Progress Notes (Signed)
Hematology and Oncology Follow Up Visit  [name] [id number] 11-Dec-1935 81 y.o. 11/08/2016   Principle Diagnosis:   History of pulmonary embolism and lower extremity thromboembolic disease; recent TIA  Current Therapy:    Xarelto 15 mg by mouth daily     Interim History:  [name] is back for follow-up. She looks okay. She has not been hospitalized since we last saw her. She is doing okay on Xarelto.  We'll last saw her, her ferritin was 45 with an iron saturation was 26%. She is coughing again. This is somewhat of a dry cough. Occasionally, there is some mucus that she will bring up.  I asked her daughter if she has problems with swallowing. Her daughter says that on occasion, she may cough after swallowing some liquids. As such, we may have to consider aspiration with her.  She's had no fever. She's had no bleeding.  She's had no bowel or bladder incontinence.  Overall, her performance status is ECOG 3 at best.   Medications:  Current Outpatient Prescriptions:  .  acetaminophen (TYLENOL) 500 MG tablet, Take 500 mg by mouth every 6 (six) hours as needed., Disp: , Rfl:  .  atorvastatin (LIPITOR) 20 MG tablet, Take 1 tablet (20 mg total) by mouth daily at 6 PM., Disp: 90 tablet, Rfl: 1 .  B Complex Vitamins (VITAMIN B COMPLEX PO), Take 1 tablet by mouth daily. , Disp: , Rfl:  .  cholecalciferol (VITAMIN D) 1000 units tablet, Take 5,000 Units by mouth daily. , Disp: , Rfl:  .  cloNIDine (CATAPRES) 0.1 MG tablet, Take 1 tablet (0.1 mg total) by mouth 2 (two) times daily., Disp: 180 tablet, Rfl: 1 .  diclofenac sodium (VOLTAREN) 1 % GEL, Apply topically 3 (three) times daily., Disp: , Rfl:  .  gabapentin (NEURONTIN) 300 MG capsule, Take 1 capsule (300 mg total) by mouth 3 (three) times daily., Disp: 270 capsule, Rfl: 1 .  glucose blood (ACCU-CHEK AVIVA PLUS) test strip, 1 each by Other route 2 (two) times daily. E11.9, Disp: 100 each, Rfl: 5 .  losartan (COZAAR) 25 MG tablet,  Take 1 tablet (25 mg total) by mouth daily., Disp: 90 tablet, Rfl: 1 .  metFORMIN (GLUCOPHAGE) 1000 MG tablet, Take 1 tablet (1,000 mg total) by mouth 2 (two) times daily with a meal., Disp: 180 tablet, Rfl: 1 .  OVER THE COUNTER MEDICATION, Take 1 capsule by mouth daily. "ultra flore balance", Disp: , Rfl:  .  Rivaroxaban (XARELTO) 15 MG TABS tablet, Take 1 tablet (15 mg total) by mouth daily with supper., Disp: 90 tablet, Rfl: 1  Allergies: No Known Allergies  Past Medical History, Surgical history, Social history, and Family History were reviewed and updated.  Review of Systems:  As above  Physical Exam:  oral temperature is 98.6 F (37 C). Her blood pressure is 150/76 (abnormal) and her pulse is 80. Her respiration is 18 and oxygen saturation is 100%.   Wt Readings from Last 3 Encounters:  10/05/16 128 lb 12 oz (58.4 kg)  08/23/16 123 lb 7.3 oz (56 kg)  06/25/16 110 lb (49.9 kg)      Elderly African-American female in no obvious distress. Head exam shows no ocular or oral lesions. She has no palpable cervical or supraclavicular lymph nodes. Lungs are clear On the right side. She has some decreased breath sounds on the left side. She has some rhonchi and crackles on the left side. Cardiac exam regular rate and rhythm with occasional extra  beat. Abdomen is soft. She has good bowel sounds. There is no fluid wave. There is no palpable liver or spleen tip. Back exam shows no tenderness over the spine, ribs or hips. Extremities shows some chronic edema in her lower legs. She has some venous stasis in the lower legs. She has no obvious venous cord. Neurological exam shows no focal neurological deficits.  Lab Results  Component Value Date   WBC 4.8 11/08/2016   HGB 10.9 (L) 11/08/2016   HCT 34.4 (L) 11/08/2016   MCV 89 11/08/2016   PLT 206 11/08/2016     Chemistry      Component Value Date/Time   NA 138 11/08/2016 1156   K 3.9 11/08/2016 1156   CL 107 11/08/2016 1156   CO2 27  11/08/2016 1156   BUN 11 11/08/2016 1156   CREATININE 0.5 (L) 11/08/2016 1156   GLU 140 03/30/2016      Component Value Date/Time   CALCIUM 9.1 11/08/2016 1156   ALKPHOS 64 11/08/2016 1156   AST 18 11/08/2016 1156   ALT 9 (L) 11/08/2016 1156   BILITOT 0.50 11/08/2016 1156         Impression and Plan: [name] is a 81 year old African American female. She has history of thromboembolic disease. I think all of her tests come back normal. Regardless, she will need lifelong anticoagulation.   I worry a little bit about her having some aspiration. With her having a past CVA, she is at risk for aspiration.  I talked to her daughter. I think it would be a good idea to get a barium swallow on her. If this is abnormal, she may need to have a formal swallowing evaluation.   I would like to get a chest x-ray on her today. I want to make sure that there is nothing going on with that left lung.  I would like to see her back in another month.  We spent about 35 minutes with her today.  [name], MD 7/3/20181:14 PM

## 2016-11-09 LAB — VITAMIN B12: VITAMIN B 12: 547 pg/mL (ref 232–1245)

## 2016-11-09 LAB — RETICULOCYTES: RETICULOCYTE COUNT: 1.4 % (ref 0.6–2.6)

## 2016-11-10 ENCOUNTER — Telehealth: Payer: Self-pay | Admitting: *Deleted

## 2016-11-10 LAB — FERRITIN: FERRITIN: 29 ng/mL (ref 9–269)

## 2016-11-10 LAB — IRON AND TIBC
%SAT: 18 % — AB (ref 21–57)
Iron: 41 ug/dL (ref 41–142)
TIBC: 226 ug/dL — ABNORMAL LOW (ref 236–444)
UIBC: 185 ug/dL (ref 120–384)

## 2016-11-10 NOTE — Telephone Encounter (Addendum)
Patient's daughter is aware of results. Appointment scheduled.   ----- Message from [name], MD sent at 11/10/2016 11:16 AM EDT ----- Call - iron is low!!  Need to set up 1 dose of feraheme.  please call her dgtr. Dierdre Searles, MD  P Onc Nurse Hp        Call her dgtr - the CXR looks ok!!! pete

## 2016-11-16 ENCOUNTER — Ambulatory Visit (HOSPITAL_BASED_OUTPATIENT_CLINIC_OR_DEPARTMENT_OTHER): Payer: Medicare Other

## 2016-11-16 ENCOUNTER — Other Ambulatory Visit: Payer: Self-pay | Admitting: Family

## 2016-11-16 VITALS — BP 156/74 | HR 93 | Temp 99.2°F | Resp 20

## 2016-11-16 DIAGNOSIS — D500 Iron deficiency anemia secondary to blood loss (chronic): Secondary | ICD-10-CM

## 2016-11-16 DIAGNOSIS — D509 Iron deficiency anemia, unspecified: Secondary | ICD-10-CM | POA: Diagnosis present

## 2016-11-16 MED ORDER — SODIUM CHLORIDE 0.9 % IV SOLN
Freq: Once | INTRAVENOUS | Status: AC
  Administered 2016-11-16: 14:00:00 via INTRAVENOUS

## 2016-11-16 MED ORDER — SODIUM CHLORIDE 0.9% FLUSH
10.0000 mL | INTRAVENOUS | Status: DC | PRN
  Filled 2016-11-16: qty 10

## 2016-11-16 MED ORDER — SODIUM CHLORIDE 0.9 % IV SOLN
510.0000 mg | Freq: Once | INTRAVENOUS | Status: AC
  Administered 2016-11-16: 510 mg via INTRAVENOUS
  Filled 2016-11-16: qty 17

## 2016-11-16 NOTE — Patient Instructions (Signed)

## 2016-11-16 NOTE — Progress Notes (Signed)
1:52 PM LL Leg edematous, care giver states this is usual.

## 2016-11-22 ENCOUNTER — Ambulatory Visit (INDEPENDENT_AMBULATORY_CARE_PROVIDER_SITE_OTHER): Payer: Medicare Other | Admitting: Interventional Cardiology

## 2016-11-22 ENCOUNTER — Encounter: Payer: Self-pay | Admitting: Interventional Cardiology

## 2016-11-22 VITALS — BP 150/80 | HR 79 | Ht 67.0 in | Wt 128.7 lb

## 2016-11-22 DIAGNOSIS — R600 Localized edema: Secondary | ICD-10-CM

## 2016-11-22 DIAGNOSIS — I639 Cerebral infarction, unspecified: Secondary | ICD-10-CM | POA: Diagnosis not present

## 2016-11-22 DIAGNOSIS — I10 Essential (primary) hypertension: Secondary | ICD-10-CM

## 2016-11-22 DIAGNOSIS — I253 Aneurysm of heart: Secondary | ICD-10-CM | POA: Diagnosis not present

## 2016-11-22 NOTE — Patient Instructions (Signed)
Medication Instructions:  Your physician recommends that you continue on your current medications as directed. Please refer to the Current Medication list given to you today.   Labwork: None ordered  Testing/Procedures: None ordered  Follow-Up: Your physician wants you to follow-up AS NEEDED  Any Other Special Instructions Will Be Listed Below (If Applicable).     If you need a refill on your cardiac medications before your next appointment, please call your pharmacy.   

## 2016-11-22 NOTE — Progress Notes (Signed)
Cardiology Office Note   Date:  11/22/2016   ID:  [name], DOB 1936-02-26, MRN [id number]  PCP:  Gildardo Cranker, DO    Chief Complaint  Patient presents with  . New Patient (Initial Visit)  atrial septal aneurysm   Wt Readings from Last 3 Encounters:  11/22/16 128 lb 11.2 oz (58.4 kg)  10/05/16 128 lb 12 oz (58.4 kg)  08/23/16 123 lb 7.3 oz (56 kg)       History of Present Illness: [name] is a 81 y.o. female  with multiple medical issues including TIA/stroke and dementia. She was in the hospital for neurologic reasons. She underwent echocardiogram and there is mention of an atrial septal aneurysm.  She is referred here for follow-up.  She denies any chest discomfort or shortness of breath. She has never been told about an aneurysm before.  She has chronic lower extremity swelling. She is accompanied by her daughter-in-law .  She is on chronic anticoagulation for prior pulmonary embolism and DVT.   Overall, she feels well and is not quite sure why she is here in the office today.    Past Medical History:  Diagnosis Date  . Dementia   . Diabetes mellitus type II 03/21/2011  . DVT (deep venous thrombosis) ([place]) 03/21/2011  . Hyperlipidemia 03/21/2011  . Hypertension 03/21/2011  . Lymphedema of leg 03/21/2011  . Pulmonary embolism ([place]) 03/21/2011    Past Surgical History:  Procedure Laterality Date  . CHOLECYSTECTOMY  2015  . SKIN TAG REMOVAL  07/25/2016  . VAGINAL HYSTERECTOMY     unknown of date  . VASCULAR SURGERY       Current Outpatient Prescriptions  Medication Sig Dispense Refill  . acetaminophen (TYLENOL) 500 MG tablet Take 500 mg by mouth every 6 (six) hours as needed (pain).     Marland Kitchen atorvastatin (LIPITOR) 20 MG tablet Take 1 tablet (20 mg total) by mouth daily at 6 PM. 90 tablet 1  . B Complex Vitamins (VITAMIN B COMPLEX PO) Take 1 tablet by mouth daily.     . cholecalciferol (VITAMIN D) 1000 units tablet Take 5,000 Units by mouth  daily.     . cloNIDine (CATAPRES) 0.1 MG tablet Take 1 tablet (0.1 mg total) by mouth 2 (two) times daily. 180 tablet 1  . diclofenac sodium (VOLTAREN) 1 % GEL Apply topically 3 (three) times daily.    Marland Kitchen gabapentin (NEURONTIN) 300 MG capsule Take 1 capsule (300 mg total) by mouth 3 (three) times daily. 270 capsule 1  . glucose blood (ACCU-CHEK AVIVA PLUS) test strip 1 each by Other route 2 (two) times daily. E11.9 100 each 5  . losartan (COZAAR) 25 MG tablet Take 1 tablet (25 mg total) by mouth daily. 90 tablet 1  . metFORMIN (GLUCOPHAGE) 1000 MG tablet Take 1 tablet (1,000 mg total) by mouth 2 (two) times daily with a meal. 180 tablet 1  . OVER THE COUNTER MEDICATION Take 1 capsule by mouth daily. "ultra flore balance"    . Rivaroxaban (XARELTO) 15 MG TABS tablet Take 1 tablet (15 mg total) by mouth daily with supper. 90 tablet 1   No current facility-administered medications for this visit.     Allergies:   Patient has no known allergies.    Social History:  The patient  reports that she has never smoked. She has never used smokeless tobacco. She reports that she does not drink alcohol or use drugs.   Family History:  The patient's family history  includes Bipolar disorder in her daughter; COPD in her sister; Heart attack in her mother; Stroke in her sister.    ROS:  Please see the history of present illness.   Otherwise, review of systems are positive for chronic LE edema.   All other systems are reviewed and negative.    PHYSICAL EXAM: VS:  BP (!) 150/80   Pulse 79   Ht 5\' 7"  (1.702 m)   Wt 128 lb 11.2 oz (58.4 kg)   BMI 20.16 kg/m  , BMI Body mass index is 20.16 kg/m. GEN: Well nourished, well developed, in no acute distress  HEENT: normal  Neck: no JVD, carotid bruits, or masses Cardiac: RRR; no murmurs, rubs, or gallops,; bilateral LE edema L>R Respiratory:  clear to auscultation bilaterally, normal work of breathing GI: soft, nontender, nondistended, + BS MS: no deformity  or atrophy  Skin: warm and dry, no rash Neuro:  Strength and sensation are intact Psych: euthymic mood, full affect   EKG:   The ekg ordered 10/04/2016 demonstrates sinus rhythm , no ST changes   Recent Labs: 09/26/2016: TSH 3.37 11/08/2016: ALT(SGPT) 9; BUN, Bld 11; Creat 0.5; HGB 10.9; Platelets 206; Potassium 3.9; Sodium 138   Lipid Panel    Component Value Date/Time   CHOL 155 09/26/2016 0843   TRIG 62 09/26/2016 0843   HDL 53 09/26/2016 0843   CHOLHDL 2.9 09/26/2016 0843   VLDL 12 09/26/2016 0843   LDLCALC 90 09/26/2016 0843     Other studies Reviewed: Additional studies/ records that were reviewed today with results demonstrating: 2018 echo reviewed.   ASSESSMENT AND PLAN:  1. Atrial septal aneurysm:   This does increase the likelihood of a PFO being present- but overall is a benign finding. This is not related to aortic aneurysm.  Given her age and other comorbidities including dementia, there is no further management required for this. No further testing required.  No cardiac symptoms at this time. 2. Hypertension: Per primary MD. 3. Lower extremity edema: Consider compression stockings. Leg elevation as much as possible as well.   Current medicines are reviewed at length with the patient today.  The patient concerns regarding her medicines were addressed.  The following changes have been made:  No change  Labs/ tests ordered today include:  No orders of the defined types were placed in this encounter.   Recommend 150 minutes/week of aerobic exercise Low fat, low carb, high fiber diet recommended  Disposition:   FU prn   Signed, Larae Grooms, MD  11/22/2016 3:03 PM    [facility] [address], [place], [place]  [address] Phone: [phone]; Fax: [phone]

## 2016-11-25 ENCOUNTER — Ambulatory Visit (HOSPITAL_COMMUNITY)
Admission: RE | Admit: 2016-11-25 | Discharge: 2016-11-25 | Disposition: A | Discharge status: Home / Self Care | Payer: Medicare Other | Source: Ambulatory Visit | Attending: Hematology & Oncology | Admitting: Hematology & Oncology

## 2016-11-25 DIAGNOSIS — Z8673 Personal history of transient ischemic attack (TIA), and cerebral infarction without residual deficits: Secondary | ICD-10-CM | POA: Insufficient documentation

## 2016-11-25 DIAGNOSIS — R05 Cough: Secondary | ICD-10-CM | POA: Insufficient documentation

## 2016-11-25 DIAGNOSIS — D500 Iron deficiency anemia secondary to blood loss (chronic): Secondary | ICD-10-CM | POA: Diagnosis not present

## 2016-11-25 DIAGNOSIS — Z86711 Personal history of pulmonary embolism: Secondary | ICD-10-CM | POA: Insufficient documentation

## 2016-11-25 DIAGNOSIS — I639 Cerebral infarction, unspecified: Secondary | ICD-10-CM

## 2016-11-25 DIAGNOSIS — R059 Cough, unspecified: Secondary | ICD-10-CM

## 2016-11-28 ENCOUNTER — Telehealth: Payer: Self-pay | Admitting: *Deleted

## 2016-11-28 DIAGNOSIS — M1712 Unilateral primary osteoarthritis, left knee: Secondary | ICD-10-CM | POA: Diagnosis not present

## 2016-11-28 NOTE — Telephone Encounter (Addendum)
Patient's daughter is aware of results  ----- Message from [name], MD sent at 11/25/2016  5:29 PM EDT ----- Cal her dgtr - The swallowing test looked ok!!!  No aspiration seen.  No blockages in the esophagus!!  [name]

## 2016-11-29 ENCOUNTER — Ambulatory Visit (INDEPENDENT_AMBULATORY_CARE_PROVIDER_SITE_OTHER): Payer: Medicare Other | Admitting: Diagnostic Neuroimaging

## 2016-11-29 ENCOUNTER — Encounter: Payer: Self-pay | Admitting: Diagnostic Neuroimaging

## 2016-11-29 VITALS — BP 145/76 | HR 73 | Ht 67.0 in | Wt 128.0 lb

## 2016-11-29 DIAGNOSIS — I639 Cerebral infarction, unspecified: Secondary | ICD-10-CM

## 2016-11-29 DIAGNOSIS — F0390 Unspecified dementia without behavioral disturbance: Secondary | ICD-10-CM | POA: Diagnosis not present

## 2016-11-29 DIAGNOSIS — G459 Transient cerebral ischemic attack, unspecified: Secondary | ICD-10-CM | POA: Diagnosis not present

## 2016-11-29 DIAGNOSIS — F03B0 Unspecified dementia, moderate, without behavioral disturbance, psychotic disturbance, mood disturbance, and anxiety: Secondary | ICD-10-CM

## 2016-11-29 NOTE — Progress Notes (Signed)
[facility]  PATIENT: [name] DOB: [birth year]  REFERRING CLINICIAN: Lonny Prude, R HISTORY FROM: patient and daughter in law REASON FOR VISIT: new consult    HISTORICAL  CHIEF COMPLAINT:  Chief Complaint  Patient presents with  . Hospitalization Follow-up  . Transient Ischemic Attack    09/2016 [facility].  finished [place] therapy's.  L leg swelling.     HISTORY OF PRESENT ILLNESS:   81 year old female with dementia, hypertension, diabetes, hypercholesterolemia, here for evaluation of possible TIA follow-up. Patient presented with transient slurred speech and facial droop. MRI of the brain was negative for acute stroke. TIA workup was completed. Since discharge patient is doing well. Patient is tolerating medications. She lives with her family. No other concerns from patient or family.   REVIEW OF SYSTEMS: Full 14 system review of systems performed and negative with exception of: Swelling in legs joint pain incontinence memory loss confusion.  ALLERGIES: No Known Allergies  HOME MEDICATIONS: Outpatient Medications Prior to Visit  Medication Sig Dispense Refill  . acetaminophen (TYLENOL) 500 MG tablet Take 500 mg by mouth every 6 (six) hours as needed (pain).     Marland Kitchen atorvastatin (LIPITOR) 20 MG tablet Take 1 tablet (20 mg total) by mouth daily at 6 PM. 90 tablet 1  . B Complex Vitamins (VITAMIN B COMPLEX PO) Take 1 tablet by mouth daily.     . cholecalciferol (VITAMIN D) 1000 units tablet Take 5,000 Units by mouth daily.     . cloNIDine (CATAPRES) 0.1 MG tablet Take 1 tablet (0.1 mg total) by mouth 2 (two) times daily. 180 tablet 1  . diclofenac sodium (VOLTAREN) 1 % GEL Apply topically 3 (three) times daily.    Marland Kitchen gabapentin (NEURONTIN) 300 MG capsule Take 1 capsule (300 mg total) by mouth 3 (three) times daily. 270 capsule 1  . glucose blood (ACCU-CHEK AVIVA PLUS) test strip 1 each by Other route 2 (two) times daily. E11.9 100 each 5  . losartan (COZAAR) 25 MG  tablet Take 1 tablet (25 mg total) by mouth daily. 90 tablet 1  . metFORMIN (GLUCOPHAGE) 1000 MG tablet Take 1 tablet (1,000 mg total) by mouth 2 (two) times daily with a meal. 180 tablet 1  . OVER THE COUNTER MEDICATION Take 1 capsule by mouth daily. "ultra flore balance"    . Rivaroxaban (XARELTO) 15 MG TABS tablet Take 1 tablet (15 mg total) by mouth daily with supper. 90 tablet 1   No facility-administered medications prior to visit.     PAST MEDICAL HISTORY: Past Medical History:  Diagnosis Date  . Dementia   . Diabetes mellitus type II 03/21/2011  . DVT (deep venous thrombosis) ([place]) 03/21/2011  . Hyperlipidemia 03/21/2011  . Hypertension 03/21/2011  . Lymphedema of leg 03/21/2011  . Pulmonary embolism ([place]) 03/21/2011    PAST SURGICAL HISTORY: Past Surgical History:  Procedure Laterality Date  . CHOLECYSTECTOMY  2015  . SKIN TAG REMOVAL  07/25/2016  . VAGINAL HYSTERECTOMY     unknown of date  . VASCULAR SURGERY      FAMILY HISTORY: Family History  Problem Relation Age of Onset  . Heart attack Mother   . COPD Sister   . Stroke Sister   . Bipolar disorder Daughter     SOCIAL HISTORY:  Social History   Social History  . Marital status: Widowed    Spouse name: N/A  . Number of children: N/A  . Years of education: N/A   Occupational History  .  Not on file.   Social History Main Topics  . Smoking status: Never Smoker  . Smokeless tobacco: Never Used  . Alcohol use No  . Drug use: No  . Sexual activity: No   Other Topics Concern  . Not on file   Social History Narrative   Diet?       Do you drink/eat things with caffeine? occasional diet soda      Marital status?   Widowed/divorced                                 What year were you married? [place] you live in a house, apartment, assisted living, condo, trailer, etc.? home      Is it one or more stories? More (mainly living on one floor)      How many persons live in your home?        Do you have any pets in your home? (please list) no      Current or past profession: teacher      Do you exercise?       Physical therapy                              Type & how often? 2-3 per week      Do you have a living will? yes      Do you have a DNR form?      ?                             If not, do you want to discuss one?      Do you have signed POA/HPOA for forms? Yes   11/29/16 Lives at home wih family, son, daughter in law, patrice and grand children.  Has 3 children.  She is widowed, retired.  Has BA- education level.          PHYSICAL EXAM  GENERAL EXAM/CONSTITUTIONAL: Vitals:  Vitals:   11/29/16 1047  BP: (!) 145/76  Pulse: 73  Weight: 128 lb (58.1 kg)  Height: 5\' 7"  (1.702 m)     Body mass index is 20.05 kg/m.  No exam data present  Patient is in no distress; well developed, nourished and groomed; neck is supple  CARDIOVASCULAR:  Examination of carotid arteries is normal; no carotid bruits  Regular rate and rhythm, no murmurs  Examination of peripheral vascular system by observation and palpation is normal  EYES:  Ophthalmoscopic exam of optic discs and posterior segments is normal; no papilledema or hemorrhages  MUSCULOSKELETAL:  Gait, strength, tone, movements noted in Neurologic exam below  NEUROLOGIC: MENTAL STATUS:  MMSE - Mini Mental State Exam 07/26/2016  Not completed: Unable to complete    awake, alert, oriented to person; NOT PLACE OR TIME  DECR memory  DECR attention and concentration  DECR FLUENCY, comprehension intact, naming intact  fund of knowledge appropriate  CRANIAL NERVE:   2nd - no papilledema on fundoscopic exam  2nd, 3rd, 4th, 6th - pupils equal and reactive to light, visual fields full to confrontation, extraocular muscles intact, no nystagmus  5th - facial sensation symmetric  7th - facial strength symmetric  8th - hearing intact  9th - palate elevates symmetrically, uvula midline  11th -  shoulder shrug symmetric  12th -  tongue protrusion midline  MOTOR:   normal bulk and tone  BUE 4  BLE 2  LLE SWELLING > RLE  SENSORY:   normal and symmetric to light touch  COORDINATION:   finger-nose-finger, fine finger movements SLOW  REFLEXES:   deep tendon reflexes TRACE and symmetric  GAIT/STATION:   IN WHEEL CHAIR; CANNOT STAND UNASSISTED    DIAGNOSTIC DATA (LABS, IMAGING, TESTING) - I reviewed patient records, labs, notes, testing and imaging myself where available.  Lab Results  Component Value Date   WBC 4.8 11/08/2016   HGB 10.9 (L) 11/08/2016   HCT 34.4 (L) 11/08/2016   MCV 89 11/08/2016   PLT 206 11/08/2016      Component Value Date/Time   NA 138 11/08/2016 1156   K 3.9 11/08/2016 1156   CL 107 11/08/2016 1156   CO2 27 11/08/2016 1156   GLUCOSE 220 (H) 11/08/2016 1156   BUN 11 11/08/2016 1156   CREATININE 0.5 (L) 11/08/2016 1156   CALCIUM 9.1 11/08/2016 1156   PROT 6.7 11/08/2016 1156   ALBUMIN 2.9 (L) 11/08/2016 1156   ALBUMIN 3.6 07/13/2016 1458   AST 18 11/08/2016 1156   ALT 9 (L) 11/08/2016 1156   ALKPHOS 64 11/08/2016 1156   BILITOT 0.50 11/08/2016 1156   GFRNONAA >60 10/04/2016 2134   GFRNONAA >89 09/26/2016 0843   GFRAA >60 10/04/2016 2134   GFRAA >89 09/26/2016 0843   Lab Results  Component Value Date   CHOL 155 09/26/2016   HDL 53 09/26/2016   LDLCALC 90 09/26/2016   TRIG 62 09/26/2016   CHOLHDL 2.9 09/26/2016   Lab Results  Component Value Date   HGBA1C 9.6 (H) 10/04/2016   Lab Results  Component Value Date   BLTJQZES92 330 11/08/2016   Lab Results  Component Value Date   TSH 3.37 09/26/2016    10/05/16 MRI brain / MRA head [I reviewed images myself and agree with interpretation. -VRP]  - Motion degraded exam. - No sign of acute infarction. 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 Extensive chronic small vessel ischemic changes elsewhere throughout the brain. - Severely degraded MR angiography appears  show flow in all the major vessels. Single exception to this is absence of flow or motion degraded absence of signal in the left vertebral artery.  10/05/16 carotid u/s - Bilateral - No evidence of extracranial ICA stenosis. Vertebral artery flow is antegrade.  10/05/16 TTE - Normal LV systolic function; mild diastolic dysfunction; moderate LVH; atrial septal aneurysm.     ASSESSMENT AND PLAN  81 y.o. year old female here with dementia, presenting with transient slurred speech and facial weakness. Most likely represents transient ischemic attack. Workup has been completed. Patient doing well.   Dx:  1. Transient cerebral ischemia, unspecified type   2. Moderate dementia without behavioral disturbance      PLAN:  I spent 30 minutes of face to face time with patient. Greater than 50% of time was spent in counseling and coordination of care with patient. In summary we discussed:   TIA (? Right brain subcortical) - continue xarelto, BP meds, statin, metformin - stroke warning signs reviewed  DEMENTIA - supportive care - gave community resources to patient and family  Return if symptoms worsen or fail to improve, for return to PCP.    Penni Bombard, MD 0/76/2263, 33:54 AM Certified in Neurology, Neurophysiology and Neuroimaging  [facility] [address] [place], [place] [address] [phone]

## 2016-11-30 ENCOUNTER — Telehealth: Payer: Self-pay | Admitting: *Deleted

## 2016-11-30 DIAGNOSIS — G458 Other transient cerebral ischemic attacks and related syndromes: Secondary | ICD-10-CM

## 2016-11-30 DIAGNOSIS — F0390 Unspecified dementia without behavioral disturbance: Secondary | ICD-10-CM

## 2016-11-30 DIAGNOSIS — Z86718 Personal history of other venous thrombosis and embolism: Secondary | ICD-10-CM

## 2016-11-30 NOTE — Telephone Encounter (Signed)
Rx faxed with copy of insurance card and demographics to [facility] per daughter.

## 2016-11-30 NOTE — Telephone Encounter (Signed)
Patient daughter, [name] called and requested a Rx for a Hospital bed for patient at home. Stated that it would increase Mobility and easier to help patient move/turn. Please Advise.

## 2016-11-30 NOTE — Telephone Encounter (Signed)
Ok for hospital bed 

## 2016-12-05 NOTE — Telephone Encounter (Signed)
Received fax from [facility], Luiz Iron [phone] regarding hospital bed, form needs to be signed by Dr. Eulas Post. Placed in Dr. Bennie Pierini. To be faxed back to [facility] Fax: [phone]

## 2016-12-06 ENCOUNTER — Ambulatory Visit (HOSPITAL_BASED_OUTPATIENT_CLINIC_OR_DEPARTMENT_OTHER): Payer: Medicare Other | Admitting: Hematology & Oncology

## 2016-12-06 ENCOUNTER — Other Ambulatory Visit (HOSPITAL_BASED_OUTPATIENT_CLINIC_OR_DEPARTMENT_OTHER): Payer: Medicare Other

## 2016-12-06 VITALS — BP 149/81 | HR 81 | Temp 97.5°F | Resp 18

## 2016-12-06 DIAGNOSIS — R059 Cough, unspecified: Secondary | ICD-10-CM

## 2016-12-06 DIAGNOSIS — E119 Type 2 diabetes mellitus without complications: Secondary | ICD-10-CM | POA: Diagnosis not present

## 2016-12-06 DIAGNOSIS — D508 Other iron deficiency anemias: Secondary | ICD-10-CM

## 2016-12-06 DIAGNOSIS — Z7901 Long term (current) use of anticoagulants: Secondary | ICD-10-CM

## 2016-12-06 DIAGNOSIS — R05 Cough: Secondary | ICD-10-CM

## 2016-12-06 DIAGNOSIS — Z86718 Personal history of other venous thrombosis and embolism: Secondary | ICD-10-CM | POA: Diagnosis not present

## 2016-12-06 DIAGNOSIS — D500 Iron deficiency anemia secondary to blood loss (chronic): Secondary | ICD-10-CM

## 2016-12-06 DIAGNOSIS — I639 Cerebral infarction, unspecified: Secondary | ICD-10-CM

## 2016-12-06 DIAGNOSIS — Z86711 Personal history of pulmonary embolism: Secondary | ICD-10-CM

## 2016-12-06 DIAGNOSIS — Z8673 Personal history of transient ischemic attack (TIA), and cerebral infarction without residual deficits: Secondary | ICD-10-CM | POA: Diagnosis not present

## 2016-12-06 LAB — CBC WITH DIFFERENTIAL (CANCER CENTER ONLY)
BASO#: 0 10*3/uL (ref 0.0–0.2)
BASO%: 0.4 % (ref 0.0–2.0)
EOS%: 0.8 % (ref 0.0–7.0)
Eosinophils Absolute: 0 10*3/uL (ref 0.0–0.5)
HCT: 38.7 % (ref 34.8–46.6)
HGB: 12.4 g/dL (ref 11.6–15.9)
LYMPH#: 1.7 10*3/uL (ref 0.9–3.3)
LYMPH%: 34.5 % (ref 14.0–48.0)
MCH: 28.5 pg (ref 26.0–34.0)
MCHC: 32 g/dL (ref 32.0–36.0)
MCV: 89 fL (ref 81–101)
MONO#: 0.5 10*3/uL (ref 0.1–0.9)
MONO%: 10.2 % (ref 0.0–13.0)
NEUT#: 2.7 10*3/uL (ref 1.5–6.5)
NEUT%: 54.1 % (ref 39.6–80.0)
PLATELETS: 180 10*3/uL (ref 145–400)
RBC: 4.35 10*6/uL (ref 3.70–5.32)
RDW: 13.7 % (ref 11.1–15.7)
WBC: 5 10*3/uL (ref 3.9–10.0)

## 2016-12-06 NOTE — Progress Notes (Signed)
Assessment performed with daughter in law assistance.

## 2016-12-06 NOTE — Progress Notes (Signed)
Hematology and Oncology Follow Up Visit  [name] [id number] May 06, 1936 81 y.o. 12/06/2016   Principle Diagnosis:   History of pulmonary embolism and lower extremity thromboembolic disease; recent TIA  Iron deficiency anemia secondary to long-term anticoagulant use  Current Therapy:    Xarelto 15 mg by mouth daily  IV iron with Feraheme-dose given 11/23/2016     Interim History:  [name] is back for follow-up. She looks better. We did have to give her some IV iron. We saw her 3 weeks ago, her ferritin was 29 with iron saturation of only 18%. I'm sure that she has chronic low-grade bleeding from her anticoagulant use.   I was worried about aspiration when I last saw her. We did a formal barium swallow. Thankfully, she did not have any aspiration.   Her daughter said that she is eating better without coughing.   She seems to have a little bit more energy.   She's had no bowel or bladder incontinence.  Overall, her performance status is ECOG 3 at best.   Medications:  Current Outpatient Prescriptions:  .  acetaminophen (TYLENOL) 500 MG tablet, Take 500 mg by mouth every 6 (six) hours as needed (pain). , Disp: , Rfl:  .  atorvastatin (LIPITOR) 20 MG tablet, Take 1 tablet (20 mg total) by mouth daily at 6 PM., Disp: 90 tablet, Rfl: 1 .  B Complex Vitamins (VITAMIN B COMPLEX PO), Take 1 tablet by mouth daily. , Disp: , Rfl:  .  cholecalciferol (VITAMIN D) 1000 units tablet, Take 5,000 Units by mouth daily. , Disp: , Rfl:  .  cloNIDine (CATAPRES) 0.1 MG tablet, Take 1 tablet (0.1 mg total) by mouth 2 (two) times daily., Disp: 180 tablet, Rfl: 1 .  diclofenac sodium (VOLTAREN) 1 % GEL, Apply topically 3 (three) times daily., Disp: , Rfl:  .  gabapentin (NEURONTIN) 300 MG capsule, Take 1 capsule (300 mg total) by mouth 3 (three) times daily., Disp: 270 capsule, Rfl: 1 .  glucose blood (ACCU-CHEK AVIVA PLUS) test strip, 1 each by Other route 2 (two) times daily. E11.9, Disp: 100  each, Rfl: 5 .  losartan (COZAAR) 25 MG tablet, Take 1 tablet (25 mg total) by mouth daily., Disp: 90 tablet, Rfl: 1 .  metFORMIN (GLUCOPHAGE) 1000 MG tablet, Take 1 tablet (1,000 mg total) by mouth 2 (two) times daily with a meal., Disp: 180 tablet, Rfl: 1 .  OVER THE COUNTER MEDICATION, Take 1 capsule by mouth daily. "ultra flore balance", Disp: , Rfl:  .  Rivaroxaban (XARELTO) 15 MG TABS tablet, Take 1 tablet (15 mg total) by mouth daily with supper., Disp: 90 tablet, Rfl: 1  Allergies: No Known Allergies  Past Medical History, Surgical history, Social history, and Family History were reviewed and updated.  Review of Systems:  As above  Physical Exam:  oral temperature is 97.5 F (36.4 C) (abnormal). Her blood pressure is 149/81 (abnormal) and her pulse is 81. Her respiration is 18 and oxygen saturation is 100%.   Wt Readings from Last 3 Encounters:  11/29/16 128 lb (58.1 kg)  11/22/16 128 lb 11.2 oz (58.4 kg)  10/05/16 128 lb 12 oz (58.4 kg)      Elderly African-American female in no obvious distress. Head exam shows no ocular or oral lesions. She has no palpable cervical or supraclavicular lymph nodes. Lungs are clear On the right side. She has some decreased breath sounds on the left side. She has some rhonchi and crackles on the left  side. Cardiac exam regular rate and rhythm with occasional extra beat. Abdomen is soft. She has good bowel sounds. There is no fluid wave. There is no palpable liver or spleen tip. Back exam shows no tenderness over the spine, ribs or hips. Extremities shows some chronic edema in her lower legs. She has some venous stasis in the lower legs. She has no obvious venous cord. Neurological exam shows no focal neurological deficits.  Lab Results  Component Value Date   WBC 4.8 11/08/2016   HGB 10.9 (L) 11/08/2016   HCT 34.4 (L) 11/08/2016   MCV 89 11/08/2016   PLT 206 11/08/2016     Chemistry      Component Value Date/Time   NA 138 11/08/2016 1156     K 3.9 11/08/2016 1156   CL 107 11/08/2016 1156   CO2 27 11/08/2016 1156   BUN 11 11/08/2016 1156   CREATININE 0.5 (L) 11/08/2016 1156   GLU 140 03/30/2016      Component Value Date/Time   CALCIUM 9.1 11/08/2016 1156   ALKPHOS 64 11/08/2016 1156   AST 18 11/08/2016 1156   ALT 9 (L) 11/08/2016 1156   BILITOT 0.50 11/08/2016 1156         Impression and Plan: [name] is a 81 year old African American female. She has history of thromboembolic disease. I think all of her tests come back normal. Regardless, she will need lifelong anticoagulation.   She clearly had iron deficiency. I'm sure this is from her being on anticoagulation and having chronic low-grade bleeding.  I'm just happy that her hemoglobin has responded so well.  I think we can now get her back in 2 months. We will see how her blood counts look.  Given that she is diabetic, we may have to check her erythropoietin level. Even if she has a low erythropoietin level, I probably would not give her ESA because of her history of stroke.  I spent about 35 minutes with her and her daughter today.  [name], MD 7/31/201812:37 PM

## 2016-12-15 ENCOUNTER — Telehealth: Payer: Self-pay | Admitting: *Deleted

## 2016-12-15 DIAGNOSIS — R5381 Other malaise: Secondary | ICD-10-CM

## 2016-12-15 NOTE — Telephone Encounter (Signed)
Patient daughter called and stated that patient needs Home Health reinstated. Stated that when patient went into hospital the [facility] was stopped. Wants [facility] to come back out for PT and OT. Please Advise.

## 2016-12-15 NOTE — Telephone Encounter (Signed)
Order placed. Daughter notified and agreed.

## 2016-12-15 NOTE — Telephone Encounter (Signed)
Ok for [facility] PT/OT for deconditioning

## 2016-12-19 DIAGNOSIS — E1142 Type 2 diabetes mellitus with diabetic polyneuropathy: Secondary | ICD-10-CM | POA: Diagnosis not present

## 2016-12-19 DIAGNOSIS — I10 Essential (primary) hypertension: Secondary | ICD-10-CM | POA: Diagnosis not present

## 2016-12-19 DIAGNOSIS — M25562 Pain in left knee: Secondary | ICD-10-CM | POA: Diagnosis not present

## 2016-12-19 DIAGNOSIS — F0390 Unspecified dementia without behavioral disturbance: Secondary | ICD-10-CM | POA: Diagnosis not present

## 2016-12-19 DIAGNOSIS — Z8673 Personal history of transient ischemic attack (TIA), and cerebral infarction without residual deficits: Secondary | ICD-10-CM | POA: Diagnosis not present

## 2016-12-19 DIAGNOSIS — Z7984 Long term (current) use of oral hypoglycemic drugs: Secondary | ICD-10-CM | POA: Diagnosis not present

## 2016-12-19 DIAGNOSIS — E785 Hyperlipidemia, unspecified: Secondary | ICD-10-CM | POA: Diagnosis not present

## 2016-12-19 DIAGNOSIS — D509 Iron deficiency anemia, unspecified: Secondary | ICD-10-CM | POA: Diagnosis not present

## 2016-12-19 DIAGNOSIS — Z86711 Personal history of pulmonary embolism: Secondary | ICD-10-CM | POA: Diagnosis not present

## 2016-12-22 DIAGNOSIS — I10 Essential (primary) hypertension: Secondary | ICD-10-CM | POA: Diagnosis not present

## 2016-12-22 DIAGNOSIS — F0390 Unspecified dementia without behavioral disturbance: Secondary | ICD-10-CM | POA: Diagnosis not present

## 2016-12-22 DIAGNOSIS — M25562 Pain in left knee: Secondary | ICD-10-CM | POA: Diagnosis not present

## 2016-12-22 DIAGNOSIS — D509 Iron deficiency anemia, unspecified: Secondary | ICD-10-CM | POA: Diagnosis not present

## 2016-12-22 DIAGNOSIS — E785 Hyperlipidemia, unspecified: Secondary | ICD-10-CM | POA: Diagnosis not present

## 2016-12-22 DIAGNOSIS — E1142 Type 2 diabetes mellitus with diabetic polyneuropathy: Secondary | ICD-10-CM | POA: Diagnosis not present

## 2016-12-26 DIAGNOSIS — I10 Essential (primary) hypertension: Secondary | ICD-10-CM | POA: Diagnosis not present

## 2016-12-26 DIAGNOSIS — D509 Iron deficiency anemia, unspecified: Secondary | ICD-10-CM | POA: Diagnosis not present

## 2016-12-26 DIAGNOSIS — E1142 Type 2 diabetes mellitus with diabetic polyneuropathy: Secondary | ICD-10-CM | POA: Diagnosis not present

## 2016-12-26 DIAGNOSIS — F0390 Unspecified dementia without behavioral disturbance: Secondary | ICD-10-CM | POA: Diagnosis not present

## 2016-12-26 DIAGNOSIS — E785 Hyperlipidemia, unspecified: Secondary | ICD-10-CM | POA: Diagnosis not present

## 2016-12-26 DIAGNOSIS — M25562 Pain in left knee: Secondary | ICD-10-CM | POA: Diagnosis not present

## 2016-12-27 ENCOUNTER — Telehealth: Payer: Self-pay

## 2016-12-27 DIAGNOSIS — M25562 Pain in left knee: Secondary | ICD-10-CM | POA: Diagnosis not present

## 2016-12-27 DIAGNOSIS — E785 Hyperlipidemia, unspecified: Secondary | ICD-10-CM | POA: Diagnosis not present

## 2016-12-27 DIAGNOSIS — D509 Iron deficiency anemia, unspecified: Secondary | ICD-10-CM | POA: Diagnosis not present

## 2016-12-27 DIAGNOSIS — E1142 Type 2 diabetes mellitus with diabetic polyneuropathy: Secondary | ICD-10-CM | POA: Diagnosis not present

## 2016-12-27 DIAGNOSIS — I10 Essential (primary) hypertension: Secondary | ICD-10-CM | POA: Diagnosis not present

## 2016-12-27 DIAGNOSIS — F0390 Unspecified dementia without behavioral disturbance: Secondary | ICD-10-CM | POA: Diagnosis not present

## 2016-12-27 NOTE — Telephone Encounter (Signed)
Aexis with [facility] called to request verbal order to continue nursing services x 2 weeks.  Per [employer] standing order, verbal order given. Message will be sent to patient's provider as a FYI.    Pending appointment 01/04/17

## 2016-12-27 NOTE — Telephone Encounter (Signed)
Ok

## 2016-12-28 DIAGNOSIS — I10 Essential (primary) hypertension: Secondary | ICD-10-CM | POA: Diagnosis not present

## 2016-12-28 DIAGNOSIS — F0390 Unspecified dementia without behavioral disturbance: Secondary | ICD-10-CM | POA: Diagnosis not present

## 2016-12-28 DIAGNOSIS — D509 Iron deficiency anemia, unspecified: Secondary | ICD-10-CM | POA: Diagnosis not present

## 2016-12-28 DIAGNOSIS — E785 Hyperlipidemia, unspecified: Secondary | ICD-10-CM | POA: Diagnosis not present

## 2016-12-28 DIAGNOSIS — E1142 Type 2 diabetes mellitus with diabetic polyneuropathy: Secondary | ICD-10-CM | POA: Diagnosis not present

## 2016-12-28 DIAGNOSIS — M25562 Pain in left knee: Secondary | ICD-10-CM | POA: Diagnosis not present

## 2016-12-30 DIAGNOSIS — M25562 Pain in left knee: Secondary | ICD-10-CM | POA: Diagnosis not present

## 2016-12-30 DIAGNOSIS — E785 Hyperlipidemia, unspecified: Secondary | ICD-10-CM | POA: Diagnosis not present

## 2016-12-30 DIAGNOSIS — D509 Iron deficiency anemia, unspecified: Secondary | ICD-10-CM | POA: Diagnosis not present

## 2016-12-30 DIAGNOSIS — I10 Essential (primary) hypertension: Secondary | ICD-10-CM | POA: Diagnosis not present

## 2016-12-30 DIAGNOSIS — F0390 Unspecified dementia without behavioral disturbance: Secondary | ICD-10-CM | POA: Diagnosis not present

## 2016-12-30 DIAGNOSIS — E1142 Type 2 diabetes mellitus with diabetic polyneuropathy: Secondary | ICD-10-CM | POA: Diagnosis not present

## 2017-01-03 ENCOUNTER — Telehealth: Payer: Self-pay | Admitting: *Deleted

## 2017-01-03 ENCOUNTER — Other Ambulatory Visit: Payer: Self-pay | Admitting: Internal Medicine

## 2017-01-03 DIAGNOSIS — E785 Hyperlipidemia, unspecified: Secondary | ICD-10-CM | POA: Diagnosis not present

## 2017-01-03 DIAGNOSIS — D509 Iron deficiency anemia, unspecified: Secondary | ICD-10-CM | POA: Diagnosis not present

## 2017-01-03 DIAGNOSIS — M25562 Pain in left knee: Secondary | ICD-10-CM | POA: Diagnosis not present

## 2017-01-03 DIAGNOSIS — E1142 Type 2 diabetes mellitus with diabetic polyneuropathy: Secondary | ICD-10-CM | POA: Diagnosis not present

## 2017-01-03 DIAGNOSIS — I10 Essential (primary) hypertension: Secondary | ICD-10-CM | POA: Diagnosis not present

## 2017-01-03 DIAGNOSIS — F0390 Unspecified dementia without behavioral disturbance: Secondary | ICD-10-CM | POA: Diagnosis not present

## 2017-01-03 NOTE — Telephone Encounter (Signed)
Please take extra dose of clonidine for BP. Reck in 1 hr

## 2017-01-03 NOTE — Telephone Encounter (Signed)
Herbert Deaner with [facility] called and stated that patient's blood pressure was 180/90 Pulse 102 at rest. No distress, consistent baseline confusion.Transferred 1 hour before BP reading. Patient missed 1 dose of BP medication last night. Has taken medication as directed since missed dose.  Blood Pressure taken again 30 minutes later and it was 170/113 Pulse 108. Please Advise.

## 2017-01-03 NOTE — Telephone Encounter (Signed)
Patient daughter in law, [name] notified and stated that she just gave patient an extra Losartan instead. Stated that she will wait to give the Clonidine and will recheck her blood pressure in an hour.

## 2017-01-03 NOTE — Telephone Encounter (Signed)
[name] with [facility] called and stated that patient has a spot on her bottom that is brown discoloration that appears to be chronic yeast. The nurse is recommending a moisturizing cream and an antifungal cream to bottom daily and as needed. Is this ok? Please Advise.

## 2017-01-04 ENCOUNTER — Ambulatory Visit: Payer: Medicare Other | Admitting: Internal Medicine

## 2017-01-04 DIAGNOSIS — F0390 Unspecified dementia without behavioral disturbance: Secondary | ICD-10-CM | POA: Diagnosis not present

## 2017-01-04 DIAGNOSIS — E1142 Type 2 diabetes mellitus with diabetic polyneuropathy: Secondary | ICD-10-CM | POA: Diagnosis not present

## 2017-01-04 DIAGNOSIS — M25562 Pain in left knee: Secondary | ICD-10-CM | POA: Diagnosis not present

## 2017-01-04 DIAGNOSIS — D509 Iron deficiency anemia, unspecified: Secondary | ICD-10-CM | POA: Diagnosis not present

## 2017-01-04 DIAGNOSIS — I10 Essential (primary) hypertension: Secondary | ICD-10-CM | POA: Diagnosis not present

## 2017-01-04 DIAGNOSIS — E785 Hyperlipidemia, unspecified: Secondary | ICD-10-CM | POA: Diagnosis not present

## 2017-01-04 NOTE — Telephone Encounter (Signed)
Ok to recommendation as above. Thanks

## 2017-01-04 NOTE — Telephone Encounter (Signed)
Noted  

## 2017-01-04 NOTE — Telephone Encounter (Signed)
[name] notified and agreed.

## 2017-01-05 DIAGNOSIS — E785 Hyperlipidemia, unspecified: Secondary | ICD-10-CM | POA: Diagnosis not present

## 2017-01-05 DIAGNOSIS — F0390 Unspecified dementia without behavioral disturbance: Secondary | ICD-10-CM | POA: Diagnosis not present

## 2017-01-05 DIAGNOSIS — I10 Essential (primary) hypertension: Secondary | ICD-10-CM | POA: Diagnosis not present

## 2017-01-05 DIAGNOSIS — D509 Iron deficiency anemia, unspecified: Secondary | ICD-10-CM | POA: Diagnosis not present

## 2017-01-05 DIAGNOSIS — M25562 Pain in left knee: Secondary | ICD-10-CM | POA: Diagnosis not present

## 2017-01-05 DIAGNOSIS — E1142 Type 2 diabetes mellitus with diabetic polyneuropathy: Secondary | ICD-10-CM | POA: Diagnosis not present

## 2017-01-10 ENCOUNTER — Encounter: Payer: Self-pay | Admitting: Internal Medicine

## 2017-01-10 ENCOUNTER — Ambulatory Visit (INDEPENDENT_AMBULATORY_CARE_PROVIDER_SITE_OTHER): Payer: Medicare Other | Admitting: Internal Medicine

## 2017-01-10 VITALS — BP 128/82 | HR 87 | Temp 98.6°F | Resp 17

## 2017-01-10 DIAGNOSIS — D509 Iron deficiency anemia, unspecified: Secondary | ICD-10-CM | POA: Diagnosis not present

## 2017-01-10 DIAGNOSIS — F0390 Unspecified dementia without behavioral disturbance: Secondary | ICD-10-CM

## 2017-01-10 DIAGNOSIS — I890 Lymphedema, not elsewhere classified: Secondary | ICD-10-CM | POA: Diagnosis not present

## 2017-01-10 DIAGNOSIS — Z86718 Personal history of other venous thrombosis and embolism: Secondary | ICD-10-CM

## 2017-01-10 DIAGNOSIS — E1142 Type 2 diabetes mellitus with diabetic polyneuropathy: Secondary | ICD-10-CM | POA: Diagnosis not present

## 2017-01-10 DIAGNOSIS — I639 Cerebral infarction, unspecified: Secondary | ICD-10-CM | POA: Diagnosis not present

## 2017-01-10 DIAGNOSIS — I10 Essential (primary) hypertension: Secondary | ICD-10-CM

## 2017-01-10 DIAGNOSIS — Z8673 Personal history of transient ischemic attack (TIA), and cerebral infarction without residual deficits: Secondary | ICD-10-CM | POA: Diagnosis not present

## 2017-01-10 DIAGNOSIS — R5381 Other malaise: Secondary | ICD-10-CM

## 2017-01-10 MED ORDER — RIVAROXABAN 15 MG PO TABS: 15.0000 mg | ORAL_TABLET | Freq: Every day | ORAL | 1 refills | Status: DC

## 2017-01-10 MED ORDER — CLONIDINE HCL 0.1 MG PO TABS: 0.1000 mg | ORAL_TABLET | Freq: Two times a day (BID) | ORAL | 1 refills | Status: DC

## 2017-01-10 MED ORDER — ACCU-CHEK SOFTCLIX LANCETS MISC: 100.0000 | Freq: Every day | 3 refills | Status: DC

## 2017-01-10 MED ORDER — LOSARTAN POTASSIUM 25 MG PO TABS: 25.0000 mg | ORAL_TABLET | Freq: Every day | ORAL | 1 refills | Status: DC

## 2017-01-10 MED ORDER — METFORMIN HCL 1000 MG PO TABS: 1000.0000 mg | ORAL_TABLET | Freq: Two times a day (BID) | ORAL | 1 refills | Status: DC

## 2017-01-10 MED ORDER — ATORVASTATIN CALCIUM 20 MG PO TABS: 20.0000 mg | ORAL_TABLET | Freq: Every day | ORAL | 1 refills | Status: DC

## 2017-01-10 MED ORDER — GABAPENTIN 300 MG PO CAPS: 300.0000 mg | ORAL_CAPSULE | Freq: Three times a day (TID) | ORAL | 1 refills | Status: DC

## 2017-01-10 MED ORDER — GLUCOSE BLOOD VI STRP: 1.0000 | ORAL_STRIP | Freq: Two times a day (BID) | 5 refills | Status: DC

## 2017-01-10 NOTE — Progress Notes (Signed)
Patient ID: [name], female   DOB: 01-08-1936, 81 y.o.   MRN: [id number]    Location:  [facility] Place of Service: OFFICE  Chief Complaint  Patient presents with  . Medical Management of Chronic Issues    Pt is being seen for a 3 month routine visit.   . Other    [name], [name], in room     HPI:  81 yo female seen today for f/u. She was hospitalized in May/June 2018 for TIA. [name] would like eyes checked but unsure if pt will allow it. Pt is a poor historian due to dementia. Hx obtained from chart.  Left knee pain - stable on voltaren gel. She had left knee pain and rec'd steroid injection in 08/2016.   Dementia - unchanged. currently not on any medication. Family stopped exelon patch as she was not showing any signs of improvement in QOL  DM - on metformin. takes gabapentin for neuropathy. A1c 9.6%. FBS 109 today.  Lymphedema - chronic L>RLE. Unchanged. She does not take diuretic. Tried TED stockings in the past but could not tolerate them.   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 No bloody stools/easy bruising  Hyperlipidemia - stable on lipitor. LDL 90  HTN - takes clonidine and losartan. SBP as high 200 in last month.  Hypokalemia - takes Kdur. K 3.9  GERD - stable on dexilant  Hx CVA - stable on xeralto.LDL 90. She did have TIA in May but sx's resolved. She saw neurology who told her to f/u prn.  Iron deficiency anemia - followed by hematology. Hgb 10.9  Past Medical History:  Diagnosis Date  . Dementia   . Diabetes mellitus type II 03/21/2011  . DVT (deep venous thrombosis) ([place]) 03/21/2011  . Hyperlipidemia 03/21/2011  . Hypertension 03/21/2011  . Lymphedema of leg 03/21/2011  . Pulmonary embolism ([facility]) 03/21/2011    Past Surgical History:  Procedure Laterality Date  . CHOLECYSTECTOMY  2015  . SKIN TAG REMOVAL  07/25/2016  . VAGINAL HYSTERECTOMY     unknown of date  . VASCULAR SURGERY      Patient Care Team: Gildardo Cranker, DO as PCP -  General (Internal Medicine) Marin Olp Rudell Cobb, MD as Consulting Physician (Oncology)  Social History   Social History  . Marital status: Widowed    Spouse name: N/A  . Number of children: N/A  . Years of education: N/A   Occupational History  . Not on file.   Social History Main Topics  . Smoking status: Never Smoker  . Smokeless tobacco: Never Used  . Alcohol use No  . Drug use: No  . Sexual activity: No   Other Topics Concern  . Not on file   Social History Narrative   Diet?       Do you drink/eat things with caffeine? occasional diet soda      Marital status?   Widowed/divorced                                 What year were you married? [place] you live in a house, apartment, assisted living, condo, trailer, etc.? home      Is it one or more stories? More (mainly living on one floor)      How many persons live in your home?      Do you have any pets in your home? (please list)  no      Current or past profession: teacher      Do you exercise?       Physical therapy                              Type & how often? 2-3 per week      Do you have a living will? yes      Do you have a DNR form?      ?                             If not, do you want to discuss one?      Do you have signed POA/HPOA for forms? Yes   11/29/16 Lives at home wih family, son, daughter in law, patrice and grand children.  Has 3 children.  She is widowed, retired.  Has BA- education level.          reports that she has never smoked. She has never used smokeless tobacco. She reports that she does not drink alcohol or use drugs.  Family History  Problem Relation Age of Onset  . Heart attack Mother   . COPD Sister   . Stroke Sister   . Bipolar disorder Daughter    Family Status  Relation Status  . Mother Deceased  . Father Deceased  . Sister Deceased  . Brother Deceased  . Sister Deceased  . Brother Deceased       murdered  . Daughter Alive  . Son Alive  . Son Alive      No Known Allergies  Medications: Patient's Medications  New Prescriptions   No medications on file  Previous Medications   ACETAMINOPHEN (TYLENOL) 500 MG TABLET    Take 500 mg by mouth every 6 (six) hours as needed (pain).    B COMPLEX VITAMINS (VITAMIN B COMPLEX PO)    Take 1 tablet by mouth daily.    CHOLECALCIFEROL (VITAMIN D) 1000 UNITS TABLET    Take 5,000 Units by mouth daily.    DICLOFENAC SODIUM (VOLTAREN) 1 % GEL    Apply topically 3 (three) times daily.   OVER THE COUNTER MEDICATION    Take 1 capsule by mouth daily. "ultra flore balance"  Modified Medications   Modified Medication Previous Medication   ACCU-CHEK SOFTCLIX LANCETS LANCETS ACCU-CHEK SOFTCLIX LANCETS lancets      100 each by Other route daily. Use as instructed Dx: E11.9    100 each by Other route daily. Use as instructed Dx: E11.9   ATORVASTATIN (LIPITOR) 20 MG TABLET atorvastatin (LIPITOR) 20 MG tablet      Take 1 tablet (20 mg total) by mouth daily at 6 PM.    Take 1 tablet (20 mg total) by mouth daily at 6 PM.   CLONIDINE (CATAPRES) 0.1 MG TABLET cloNIDine (CATAPRES) 0.1 MG tablet      Take 1 tablet (0.1 mg total) by mouth 2 (two) times daily.    TAKE 1 TABLET BY MOUTH TWICE DAILY   GABAPENTIN (NEURONTIN) 300 MG CAPSULE gabapentin (NEURONTIN) 300 MG capsule      Take 1 capsule (300 mg total) by mouth 3 (three) times daily.    Take 1 capsule (300 mg total) by mouth 3 (three) times daily.   GLUCOSE BLOOD (ACCU-CHEK AVIVA PLUS) TEST STRIP glucose blood (ACCU-CHEK AVIVA PLUS) test strip  1 each by Other route 2 (two) times daily. E11.9    1 each by Other route 2 (two) times daily. E11.9   LOSARTAN (COZAAR) 25 MG TABLET losartan (COZAAR) 25 MG tablet      Take 1 tablet (25 mg total) by mouth daily.    Take 1 tablet (25 mg total) by mouth daily.   METFORMIN (GLUCOPHAGE) 1000 MG TABLET metFORMIN (GLUCOPHAGE) 1000 MG tablet      Take 1 tablet (1,000 mg total) by mouth 2 (two) times daily with a meal.    Take  1 tablet (1,000 mg total) by mouth 2 (two) times daily with a meal.   RIVAROXABAN (XARELTO) 15 MG TABS TABLET Rivaroxaban (XARELTO) 15 MG TABS tablet      Take 1 tablet (15 mg total) by mouth daily with supper.    Take 1 tablet (15 mg total) by mouth daily with supper.  Discontinued Medications   No medications on file    Review of Systems  Unable to perform ROS: Dementia (memory loss)    Vitals:   01/10/17 1145  BP: 128/82  Pulse: 87  Resp: 17  Temp: 98.6 F (37 C)  TempSrc: Oral  SpO2: 98%   There is no height or weight on file to calculate BMI.  Physical Exam  Constitutional: She appears well-developed.  HENT:  Mouth/Throat: Oropharynx is clear and moist. No oropharyngeal exudate.  Declined oral exam  Eyes: Pupils are equal, round, and reactive to light. Right eye exhibits no discharge. Left eye exhibits no discharge. No scleral icterus.  Neck: Neck supple. Carotid bruit is not present. No tracheal deviation present. No thyromegaly present.  Cardiovascular: Normal rate, regular rhythm and intact distal pulses.  Exam reveals no gallop and no friction rub.   Murmur (1/6 SEM) heard. L>R +2 pitting :E edema. No calf TTP. No palpable cords  Pulmonary/Chest: Effort normal and breath sounds normal. No stridor. No respiratory distress. She has no wheezes. She has no rales.  Abdominal: Soft. Normal appearance and bowel sounds are normal. She exhibits no distension and no mass. There is no hepatomegaly. There is no tenderness. There is no rigidity, no rebound and no guarding. No hernia.  Musculoskeletal: She exhibits edema and deformity. She exhibits no tenderness.  Lymphadenopathy:    She has no cervical adenopathy.  Neurological: She is alert.  Skin: Skin is warm and dry. No rash noted.  Psychiatric: She has a normal mood and affect. Her behavior is normal.     Labs reviewed: Appointment on 12/06/2016  Component Date Value Ref Range Status  . WBC 12/06/2016 5.0  3.9 - 10.0  10e3/uL Final  . RBC 12/06/2016 4.35  3.70 - 5.32 10e6/uL Final  . HGB 12/06/2016 12.4  11.6 - 15.9 g/dL Final  . HCT 12/06/2016 38.7  34.8 - 46.6 % Final  . MCV 12/06/2016 89  81 - 101 fL Final  . MCH 12/06/2016 28.5  26.0 - 34.0 pg Final  . MCHC 12/06/2016 32.0  32.0 - 36.0 g/dL Final  . RDW 12/06/2016 13.7  11.1 - 15.7 % Final  . Platelets 12/06/2016 180  145 - 400 10e3/uL Final  . NEUT# 12/06/2016 2.7  1.5 - 6.5 10e3/uL Final  . LYMPH# 12/06/2016 1.7  0.9 - 3.3 10e3/uL Final  . MONO# 12/06/2016 0.5  0.1 - 0.9 10e3/uL Final  . Eosinophils Absolute 12/06/2016 0.0  0.0 - 0.5 10e3/uL Final  . BASO# 12/06/2016 0.0  0.0 - 0.2 10e3/uL Final  .  NEUT% 12/06/2016 54.1  39.6 - 80.0 % Final  . LYMPH% 12/06/2016 34.5  14.0 - 48.0 % Final  . MONO% 12/06/2016 10.2  0.0 - 13.0 % Final  . EOS% 12/06/2016 0.8  0.0 - 7.0 % Final  . BASO% 12/06/2016 0.4  0.0 - 2.0 % Final  Appointment on 11/08/2016  Component Date Value Ref Range Status  . WBC 11/08/2016 4.8  3.9 - 10.0 10e3/uL Final  . RBC 11/08/2016 3.88  3.70 - 5.32 10e6/uL Final  . HGB 11/08/2016 10.9* 11.6 - 15.9 g/dL Final  . HCT 11/08/2016 34.4* 34.8 - 46.6 % Final  . MCV 11/08/2016 89  81 - 101 fL Final  . MCH 11/08/2016 28.1  26.0 - 34.0 pg Final  . MCHC 11/08/2016 31.7* 32.0 - 36.0 g/dL Final  . RDW 11/08/2016 13.2  11.1 - 15.7 % Final  . Platelets 11/08/2016 206  145 - 400 10e3/uL Final  . NEUT# 11/08/2016 2.6  1.5 - 6.5 10e3/uL Final  . LYMPH# 11/08/2016 1.6  0.9 - 3.3 10e3/uL Final  . MONO# 11/08/2016 0.5  0.1 - 0.9 10e3/uL Final  . Eosinophils Absolute 11/08/2016 0.0  0.0 - 0.5 10e3/uL Final  . BASO# 11/08/2016 0.0  0.0 - 0.2 10e3/uL Final  . NEUT% 11/08/2016 54.8  39.6 - 80.0 % Final  . LYMPH% 11/08/2016 32.7  14.0 - 48.0 % Final  . MONO% 11/08/2016 11.3  0.0 - 13.0 % Final  . EOS% 11/08/2016 0.8  0.0 - 7.0 % Final  . BASO% 11/08/2016 0.4  0.0 - 2.0 % Final  . Sodium 11/08/2016 138  128 - 145 mEq/L Final  . Potassium  11/08/2016 3.9  3.3 - 4.7 mEq/L Final  . Chloride 11/08/2016 107  98 - 108 mEq/L Final  . CO2 11/08/2016 27  18 - 33 mEq/L Final  . Glucose, Bld 11/08/2016 220* 73 - 118 mg/dL Final  . BUN, Bld 11/08/2016 11  7 - 22 mg/dL Final  . Creat 11/08/2016 0.5* 0.6 - 1.2 mg/dl Final  . Total Bilirubin 11/08/2016 0.50  0.20 - 1.60 mg/dl Final  . Alkaline Phosphatase 11/08/2016 64  26 - 84 U/L Final  . AST 11/08/2016 18  11 - 38 U/L Final  . ALT(SGPT) 11/08/2016 9* 10 - 47 U/L Final  . Total Protein 11/08/2016 6.7  6.4 - 8.1 g/dL Final  . Albumin 11/08/2016 2.9* 3.3 - 5.5 g/dL Final  . Calcium 11/08/2016 9.1  8.0 - 10.3 mg/dL Final  . Vitamin B12 11/08/2016 547  232 - 1,245 pg/mL Final  . Ferritin 11/08/2016 29  9 - 269 ng/ml Final  . Iron 11/08/2016 41  41 - 142 ug/dL Final  . TIBC 11/08/2016 226* 236 - 444 ug/dL Final  . UIBC 11/08/2016 185  120 - 384 ug/dL Final  . %SAT 11/08/2016 18* 21 - 57 % Final  . Reticulocyte Count 11/08/2016 1.4  0.6 - 2.6 % Final  Office Visit on 10/13/2016  Component Date Value Ref Range Status  . Ferritin 10/13/2016 45  20 - 288 ng/mL Final  . Iron 10/13/2016 66  45 - 160 ug/dL Final  . UIBC 10/13/2016 185  ug/dL Final  . TIBC 10/13/2016 251  250 - 450 ug/dL Final  . %SAT 10/13/2016 26  11 - 50 % Final  . WBC 10/13/2016 4.4  3.8 - 10.8 K/uL Final  . RBC 10/13/2016 3.85  3.80 - 5.10 MIL/uL Final  . Hemoglobin 10/13/2016 10.5* 11.7 - 15.5 g/dL Final  .  HCT 10/13/2016 33.5* 35.0 - 45.0 % Final  . MCV 10/13/2016 87.0  80.0 - 100.0 fL Final  . MCH 10/13/2016 27.3  27.0 - 33.0 pg Final  . MCHC 10/13/2016 31.3* 32.0 - 36.0 g/dL Final  . RDW 10/13/2016 14.2  11.0 - 15.0 % Final  . Platelets 10/13/2016 235  140 - 400 K/uL Final  . MPV 10/13/2016 9.0  7.5 - 12.5 fL Final  . Neutro Abs 10/13/2016 2464  1,500 - 7,800 cells/uL Final  . Lymphs Abs 10/13/2016 1188  850 - 3,900 cells/uL Final  . Monocytes Absolute 10/13/2016 660  200 - 950 cells/uL Final  . Eosinophils  Absolute 10/13/2016 44  15 - 500 cells/uL Final  . Basophils Absolute 10/13/2016 44  0 - 200 cells/uL Final  . Neutrophils Relative % 10/13/2016 56  % Final  . Lymphocytes Relative 10/13/2016 27  % Final  . Monocytes Relative 10/13/2016 15  % Final  . Eosinophils Relative 10/13/2016 1  % Final  . Basophils Relative 10/13/2016 1  % Final  . Smear Review 10/13/2016 Criteria for review not met   Final  . Color, Urine 10/13/2016 YELLOW  YELLOW Final  . APPearance 10/13/2016 CLEAR  CLEAR Final  . Specific Gravity, Urine 10/13/2016 1.024  1.001 - 1.035 Final  . pH 10/13/2016 5.5  5.0 - 8.0 Final  . Glucose, UA 10/13/2016 TRACE* NEGATIVE Final  . Bilirubin Urine 10/13/2016 NEGATIVE  NEGATIVE Final  . Ketones, ur 10/13/2016 NEGATIVE  NEGATIVE Final  . Hgb urine dipstick 10/13/2016 NEGATIVE  NEGATIVE Final  . Protein, ur 10/13/2016 TRACE* NEGATIVE Final  . Nitrite 10/13/2016 NEGATIVE  NEGATIVE Final  . Leukocytes, UA 10/13/2016 NEGATIVE  NEGATIVE Final  . Organism ID, Bacteria 10/13/2016 NO GROWTH   Final  Appointment on 10/11/2016  Component Date Value Ref Range Status  . WBC 10/11/2016 5.7  3.9 - 10.0 10e3/uL Final  . RBC 10/11/2016 3.74  3.70 - 5.32 10e6/uL Final  . HGB 10/11/2016 10.5* 11.6 - 15.9 g/dL Final  . HCT 10/11/2016 33.3* 34.8 - 46.6 % Final  . MCV 10/11/2016 89  81 - 101 fL Final  . MCH 10/11/2016 28.1  26.0 - 34.0 pg Final  . MCHC 10/11/2016 31.5* 32.0 - 36.0 g/dL Final  . RDW 10/11/2016 13.4  11.1 - 15.7 % Final  . Platelets 10/11/2016 202  145 - 400 10e3/uL Final  . NEUT# 10/11/2016 3.4  1.5 - 6.5 10e3/uL Final  . LYMPH# 10/11/2016 1.6  0.9 - 3.3 10e3/uL Final  . MONO# 10/11/2016 0.7  0.1 - 0.9 10e3/uL Final  . Eosinophils Absolute 10/11/2016 0.0  0.0 - 0.5 10e3/uL Final  . BASO# 10/11/2016 0.0  0.0 - 0.2 10e3/uL Final  . NEUT% 10/11/2016 59.6  39.6 - 80.0 % Final  . LYMPH% 10/11/2016 27.1  14.0 - 48.0 % Final  . MONO% 10/11/2016 12.4  0.0 - 13.0 % Final  . EOS%  10/11/2016 0.7  0.0 - 7.0 % Final  . BASO% 10/11/2016 0.2  0.0 - 2.0 % Final  . Sodium 10/11/2016 140  128 - 145 mEq/L Final  . Potassium 10/11/2016 4.3  3.3 - 4.7 mEq/L Final  . Chloride 10/11/2016 99  98 - 108 mEq/L Final  . CO2 10/11/2016 28  18 - 33 mEq/L Final  . Glucose, Bld 10/11/2016 232* 73 - 118 mg/dL Final  . BUN, Bld 10/11/2016 11  7 - 22 mg/dL Final  . Creat 10/11/2016 0.5* 0.6 - 1.2 mg/dl Final  .  Total Bilirubin 10/11/2016 0.60  0.20 - 1.60 mg/dl Final  . Alkaline Phosphatase 10/11/2016 56  26 - 84 U/L Final  . AST 10/11/2016 17  11 - 38 U/L Final  . ALT(SGPT) 10/11/2016 16  10 - 47 U/L Final  . Total Protein 10/11/2016 6.7  6.4 - 8.1 g/dL Final  . Albumin 10/11/2016 2.9* 3.3 - 5.5 g/dL Final  . Calcium 10/11/2016 9.4  8.0 - 10.3 mg/dL Final    No results found.   Assessment/Plan   ICD-10-CM   1. Type 2 diabetes mellitus with diabetic polyneuropathy, without long-term current use of insulin (HCC) E11.42 metFORMIN (GLUCOPHAGE) 1000 MG tablet    CMP with eGFR    TSH    Hemoglobin A1c    Microalbumin/Creatinine Ratio, Urine  2. History of DVT (deep vein thrombosis) Z86.718 Rivaroxaban (XARELTO) 15 MG TABS tablet  3. Physical deconditioning R53.81   4. Senile dementia without behavioral disturbance F03.90   5. Lymphedema of left lower extremity I89.0   6. Iron deficiency anemia, unspecified iron deficiency anemia type D50.9   7. History of TIA (transient ischemic attack) Z86.73   8. Essential hypertension I10 TSH    cloNIDine (CATAPRES) 0.1 MG tablet   Change clonidine - may take additional 1 tablet daily as needed for SBP >160  Continue other medications as ordered  Continue home PT/OT  Will call with lab results  Follow up with specialists as scheduled  Declined flu shot today  Follow up in 3 mos for HTN, dementia, DVT/PE   Karlisa Gaubert S. Perlie Gold  [facility] [address] [place], [place]  [address] [phone] Cell (Monday-Friday 8 AM - 5 PM) [phone] After 5 PM and follow prompts

## 2017-01-10 NOTE — Patient Instructions (Addendum)
Change clonidine - may take additional 1 tablet daily as needed for SBP >160  Continue other medications as ordered  Continue home PT/OT  Will call with lab results  Follow up with specialists as scheduled  Follow up in 3 mos for HTN, dementia, DVT/PE

## 2017-01-11 DIAGNOSIS — I10 Essential (primary) hypertension: Secondary | ICD-10-CM | POA: Diagnosis not present

## 2017-01-11 DIAGNOSIS — F0390 Unspecified dementia without behavioral disturbance: Secondary | ICD-10-CM | POA: Diagnosis not present

## 2017-01-11 DIAGNOSIS — D509 Iron deficiency anemia, unspecified: Secondary | ICD-10-CM | POA: Diagnosis not present

## 2017-01-11 DIAGNOSIS — E1142 Type 2 diabetes mellitus with diabetic polyneuropathy: Secondary | ICD-10-CM | POA: Diagnosis not present

## 2017-01-11 DIAGNOSIS — M25562 Pain in left knee: Secondary | ICD-10-CM | POA: Diagnosis not present

## 2017-01-11 DIAGNOSIS — E785 Hyperlipidemia, unspecified: Secondary | ICD-10-CM | POA: Diagnosis not present

## 2017-01-11 LAB — COMPLETE METABOLIC PANEL WITH GFR
ALBUMIN: 3.5 g/dL — AB (ref 3.6–5.1)
ALK PHOS: 72 U/L (ref 33–130)
ALT: 8 U/L (ref 6–29)
AST: 10 U/L (ref 10–35)
BILIRUBIN TOTAL: 0.3 mg/dL (ref 0.2–1.2)
BUN: 17 mg/dL (ref 7–25)
CO2: 21 mmol/L (ref 20–32)
Calcium: 9.1 mg/dL (ref 8.6–10.4)
Chloride: 102 mmol/L (ref 98–110)
Creat: 0.53 mg/dL — ABNORMAL LOW (ref 0.60–0.93)
GLUCOSE: 191 mg/dL — AB (ref 65–99)
POTASSIUM: 5.1 mmol/L (ref 3.5–5.3)
SODIUM: 139 mmol/L (ref 135–146)
TOTAL PROTEIN: 6.2 g/dL (ref 6.1–8.1)

## 2017-01-11 LAB — TSH: TSH: 2.39 mIU/L

## 2017-01-11 LAB — HEMOGLOBIN A1C
Hgb A1c MFr Bld: 8.6 % — ABNORMAL HIGH (ref <5.7)
Mean Plasma Glucose: 200 mg/dL

## 2017-01-13 DIAGNOSIS — M25562 Pain in left knee: Secondary | ICD-10-CM | POA: Diagnosis not present

## 2017-01-13 DIAGNOSIS — E1142 Type 2 diabetes mellitus with diabetic polyneuropathy: Secondary | ICD-10-CM | POA: Diagnosis not present

## 2017-01-13 DIAGNOSIS — D509 Iron deficiency anemia, unspecified: Secondary | ICD-10-CM | POA: Diagnosis not present

## 2017-01-13 DIAGNOSIS — I10 Essential (primary) hypertension: Secondary | ICD-10-CM | POA: Diagnosis not present

## 2017-01-13 DIAGNOSIS — F0390 Unspecified dementia without behavioral disturbance: Secondary | ICD-10-CM | POA: Diagnosis not present

## 2017-01-13 DIAGNOSIS — E785 Hyperlipidemia, unspecified: Secondary | ICD-10-CM | POA: Diagnosis not present

## 2017-01-16 ENCOUNTER — Telehealth: Payer: Self-pay

## 2017-01-16 MED ORDER — SITAGLIPTIN PHOSPHATE 50 MG PO TABS: 50.0000 mg | ORAL_TABLET | Freq: Every day | ORAL | 1 refills | Status: DC

## 2017-01-16 NOTE — Telephone Encounter (Signed)
-----   Message from [place], [place] sent at 01/11/2017  9:28 AM EDT ----- diabtes still uncontrolled but improving - recommend add januvia 50mg  1 tab po daily # 30 with 3RF; other labs stable; follow up as scheduled

## 2017-01-16 NOTE — Telephone Encounter (Signed)
Prescription was sent to pharmacy for #90 with 1 RF at patient's request.

## 2017-01-19 DIAGNOSIS — F0390 Unspecified dementia without behavioral disturbance: Secondary | ICD-10-CM | POA: Diagnosis not present

## 2017-01-19 DIAGNOSIS — M25562 Pain in left knee: Secondary | ICD-10-CM | POA: Diagnosis not present

## 2017-01-19 DIAGNOSIS — E785 Hyperlipidemia, unspecified: Secondary | ICD-10-CM | POA: Diagnosis not present

## 2017-01-19 DIAGNOSIS — E1142 Type 2 diabetes mellitus with diabetic polyneuropathy: Secondary | ICD-10-CM | POA: Diagnosis not present

## 2017-01-19 DIAGNOSIS — D509 Iron deficiency anemia, unspecified: Secondary | ICD-10-CM | POA: Diagnosis not present

## 2017-01-19 DIAGNOSIS — I10 Essential (primary) hypertension: Secondary | ICD-10-CM | POA: Diagnosis not present

## 2017-01-20 DIAGNOSIS — F0390 Unspecified dementia without behavioral disturbance: Secondary | ICD-10-CM | POA: Diagnosis not present

## 2017-01-20 DIAGNOSIS — D509 Iron deficiency anemia, unspecified: Secondary | ICD-10-CM | POA: Diagnosis not present

## 2017-01-20 DIAGNOSIS — E785 Hyperlipidemia, unspecified: Secondary | ICD-10-CM | POA: Diagnosis not present

## 2017-01-20 DIAGNOSIS — M25562 Pain in left knee: Secondary | ICD-10-CM | POA: Diagnosis not present

## 2017-01-20 DIAGNOSIS — E1142 Type 2 diabetes mellitus with diabetic polyneuropathy: Secondary | ICD-10-CM | POA: Diagnosis not present

## 2017-01-20 DIAGNOSIS — I10 Essential (primary) hypertension: Secondary | ICD-10-CM | POA: Diagnosis not present

## 2017-01-24 DIAGNOSIS — E113393 Type 2 diabetes mellitus with moderate nonproliferative diabetic retinopathy without macular edema, bilateral: Secondary | ICD-10-CM | POA: Diagnosis not present

## 2017-01-24 LAB — HM DIABETES EYE EXAM

## 2017-01-25 ENCOUNTER — Telehealth: Payer: Self-pay | Admitting: *Deleted

## 2017-01-25 DIAGNOSIS — M25562 Pain in left knee: Secondary | ICD-10-CM | POA: Diagnosis not present

## 2017-01-25 DIAGNOSIS — F0390 Unspecified dementia without behavioral disturbance: Secondary | ICD-10-CM | POA: Diagnosis not present

## 2017-01-25 DIAGNOSIS — E1142 Type 2 diabetes mellitus with diabetic polyneuropathy: Secondary | ICD-10-CM | POA: Diagnosis not present

## 2017-01-25 DIAGNOSIS — D509 Iron deficiency anemia, unspecified: Secondary | ICD-10-CM | POA: Diagnosis not present

## 2017-01-25 DIAGNOSIS — E785 Hyperlipidemia, unspecified: Secondary | ICD-10-CM | POA: Diagnosis not present

## 2017-01-25 DIAGNOSIS — I10 Essential (primary) hypertension: Secondary | ICD-10-CM | POA: Diagnosis not present

## 2017-01-25 NOTE — Telephone Encounter (Signed)
[facility] for palliative care referral; for constipation, recommend fleets enema or soap suds enema; take miralax daily

## 2017-01-25 NOTE — Telephone Encounter (Signed)
Alexa with [facility] called requesting:  1. Palliative Care Consult Order-stated family is requesting.  2. Patient goes 5-6 days without bowel movement. Stated that patient had one yesterday but hadn't since last Wednesday. Requesting a PRN medication for constipation. Daughter stated BM is soft not hard but only once a week.   Please Advise.

## 2017-01-25 NOTE — Telephone Encounter (Signed)
Alexis notified and agreed. Will discuss options with patient's daughter.

## 2017-01-26 DIAGNOSIS — F0390 Unspecified dementia without behavioral disturbance: Secondary | ICD-10-CM | POA: Diagnosis not present

## 2017-01-26 DIAGNOSIS — I10 Essential (primary) hypertension: Secondary | ICD-10-CM | POA: Diagnosis not present

## 2017-01-26 DIAGNOSIS — M25562 Pain in left knee: Secondary | ICD-10-CM | POA: Diagnosis not present

## 2017-01-26 DIAGNOSIS — D509 Iron deficiency anemia, unspecified: Secondary | ICD-10-CM | POA: Diagnosis not present

## 2017-01-26 DIAGNOSIS — E785 Hyperlipidemia, unspecified: Secondary | ICD-10-CM | POA: Diagnosis not present

## 2017-01-26 DIAGNOSIS — E1142 Type 2 diabetes mellitus with diabetic polyneuropathy: Secondary | ICD-10-CM | POA: Diagnosis not present

## 2017-02-01 DIAGNOSIS — E785 Hyperlipidemia, unspecified: Secondary | ICD-10-CM | POA: Diagnosis not present

## 2017-02-01 DIAGNOSIS — M25562 Pain in left knee: Secondary | ICD-10-CM | POA: Diagnosis not present

## 2017-02-01 DIAGNOSIS — I10 Essential (primary) hypertension: Secondary | ICD-10-CM | POA: Diagnosis not present

## 2017-02-01 DIAGNOSIS — D509 Iron deficiency anemia, unspecified: Secondary | ICD-10-CM | POA: Diagnosis not present

## 2017-02-01 DIAGNOSIS — F0390 Unspecified dementia without behavioral disturbance: Secondary | ICD-10-CM | POA: Diagnosis not present

## 2017-02-01 DIAGNOSIS — E1142 Type 2 diabetes mellitus with diabetic polyneuropathy: Secondary | ICD-10-CM | POA: Diagnosis not present

## 2017-02-02 ENCOUNTER — Telehealth: Payer: Self-pay | Admitting: *Deleted

## 2017-02-02 DIAGNOSIS — I10 Essential (primary) hypertension: Secondary | ICD-10-CM | POA: Diagnosis not present

## 2017-02-02 DIAGNOSIS — F0390 Unspecified dementia without behavioral disturbance: Secondary | ICD-10-CM | POA: Diagnosis not present

## 2017-02-02 DIAGNOSIS — D509 Iron deficiency anemia, unspecified: Secondary | ICD-10-CM | POA: Diagnosis not present

## 2017-02-02 DIAGNOSIS — E1142 Type 2 diabetes mellitus with diabetic polyneuropathy: Secondary | ICD-10-CM | POA: Diagnosis not present

## 2017-02-02 DIAGNOSIS — E785 Hyperlipidemia, unspecified: Secondary | ICD-10-CM | POA: Diagnosis not present

## 2017-02-02 DIAGNOSIS — M25562 Pain in left knee: Secondary | ICD-10-CM | POA: Diagnosis not present

## 2017-02-02 NOTE — Telephone Encounter (Signed)
Jim with [facility] called requesting verbal orders for Speech Therapy due to difficulty swallowing pills. Verbal order given.

## 2017-02-06 DIAGNOSIS — E785 Hyperlipidemia, unspecified: Secondary | ICD-10-CM | POA: Diagnosis not present

## 2017-02-06 DIAGNOSIS — D509 Iron deficiency anemia, unspecified: Secondary | ICD-10-CM | POA: Diagnosis not present

## 2017-02-06 DIAGNOSIS — F0390 Unspecified dementia without behavioral disturbance: Secondary | ICD-10-CM | POA: Diagnosis not present

## 2017-02-06 DIAGNOSIS — E1142 Type 2 diabetes mellitus with diabetic polyneuropathy: Secondary | ICD-10-CM | POA: Diagnosis not present

## 2017-02-06 DIAGNOSIS — M25562 Pain in left knee: Secondary | ICD-10-CM | POA: Diagnosis not present

## 2017-02-06 DIAGNOSIS — I10 Essential (primary) hypertension: Secondary | ICD-10-CM | POA: Diagnosis not present

## 2017-02-07 ENCOUNTER — Ambulatory Visit: Payer: Medicare Other | Admitting: Family

## 2017-02-07 ENCOUNTER — Other Ambulatory Visit: Payer: Medicare Other

## 2017-02-07 DIAGNOSIS — F0390 Unspecified dementia without behavioral disturbance: Secondary | ICD-10-CM | POA: Diagnosis not present

## 2017-02-07 DIAGNOSIS — E785 Hyperlipidemia, unspecified: Secondary | ICD-10-CM | POA: Diagnosis not present

## 2017-02-07 DIAGNOSIS — I10 Essential (primary) hypertension: Secondary | ICD-10-CM | POA: Diagnosis not present

## 2017-02-07 DIAGNOSIS — E1142 Type 2 diabetes mellitus with diabetic polyneuropathy: Secondary | ICD-10-CM | POA: Diagnosis not present

## 2017-02-07 DIAGNOSIS — D509 Iron deficiency anemia, unspecified: Secondary | ICD-10-CM | POA: Diagnosis not present

## 2017-02-07 DIAGNOSIS — M25562 Pain in left knee: Secondary | ICD-10-CM | POA: Diagnosis not present

## 2017-02-10 DIAGNOSIS — E785 Hyperlipidemia, unspecified: Secondary | ICD-10-CM | POA: Diagnosis not present

## 2017-02-10 DIAGNOSIS — F0390 Unspecified dementia without behavioral disturbance: Secondary | ICD-10-CM | POA: Diagnosis not present

## 2017-02-10 DIAGNOSIS — E1142 Type 2 diabetes mellitus with diabetic polyneuropathy: Secondary | ICD-10-CM | POA: Diagnosis not present

## 2017-02-10 DIAGNOSIS — M25562 Pain in left knee: Secondary | ICD-10-CM | POA: Diagnosis not present

## 2017-02-10 DIAGNOSIS — D509 Iron deficiency anemia, unspecified: Secondary | ICD-10-CM | POA: Diagnosis not present

## 2017-02-10 DIAGNOSIS — I10 Essential (primary) hypertension: Secondary | ICD-10-CM | POA: Diagnosis not present

## 2017-02-15 DIAGNOSIS — I10 Essential (primary) hypertension: Secondary | ICD-10-CM | POA: Diagnosis not present

## 2017-02-15 DIAGNOSIS — F0390 Unspecified dementia without behavioral disturbance: Secondary | ICD-10-CM | POA: Diagnosis not present

## 2017-02-15 DIAGNOSIS — E1142 Type 2 diabetes mellitus with diabetic polyneuropathy: Secondary | ICD-10-CM | POA: Diagnosis not present

## 2017-02-15 DIAGNOSIS — D509 Iron deficiency anemia, unspecified: Secondary | ICD-10-CM | POA: Diagnosis not present

## 2017-02-15 DIAGNOSIS — M25562 Pain in left knee: Secondary | ICD-10-CM | POA: Diagnosis not present

## 2017-02-15 DIAGNOSIS — E785 Hyperlipidemia, unspecified: Secondary | ICD-10-CM | POA: Diagnosis not present

## 2017-02-21 DIAGNOSIS — F0390 Unspecified dementia without behavioral disturbance: Secondary | ICD-10-CM | POA: Diagnosis not present

## 2017-02-28 ENCOUNTER — Ambulatory Visit (HOSPITAL_BASED_OUTPATIENT_CLINIC_OR_DEPARTMENT_OTHER): Payer: Medicare Other | Admitting: Family

## 2017-02-28 ENCOUNTER — Other Ambulatory Visit (HOSPITAL_BASED_OUTPATIENT_CLINIC_OR_DEPARTMENT_OTHER): Payer: Medicare Other

## 2017-02-28 VITALS — BP 164/93 | HR 79 | Temp 98.7°F | Resp 16

## 2017-02-28 DIAGNOSIS — Z86718 Personal history of other venous thrombosis and embolism: Secondary | ICD-10-CM | POA: Diagnosis not present

## 2017-02-28 DIAGNOSIS — D500 Iron deficiency anemia secondary to blood loss (chronic): Secondary | ICD-10-CM | POA: Diagnosis not present

## 2017-02-28 DIAGNOSIS — Z8673 Personal history of transient ischemic attack (TIA), and cerebral infarction without residual deficits: Secondary | ICD-10-CM

## 2017-02-28 DIAGNOSIS — D508 Other iron deficiency anemias: Secondary | ICD-10-CM | POA: Diagnosis not present

## 2017-02-28 DIAGNOSIS — Z86711 Personal history of pulmonary embolism: Secondary | ICD-10-CM

## 2017-02-28 DIAGNOSIS — Z7901 Long term (current) use of anticoagulants: Secondary | ICD-10-CM

## 2017-02-28 LAB — CBC WITH DIFFERENTIAL (CANCER CENTER ONLY)
BASO#: 0 10*3/uL (ref 0.0–0.2)
BASO%: 0.3 % (ref 0.0–2.0)
EOS ABS: 0 10*3/uL (ref 0.0–0.5)
EOS%: 0.8 % (ref 0.0–7.0)
HCT: 36 % (ref 34.8–46.6)
HEMOGLOBIN: 11.6 g/dL (ref 11.6–15.9)
LYMPH#: 1.5 10*3/uL (ref 0.9–3.3)
LYMPH%: 38.6 % (ref 14.0–48.0)
MCH: 29.3 pg (ref 26.0–34.0)
MCHC: 32.2 g/dL (ref 32.0–36.0)
MCV: 91 fL (ref 81–101)
MONO#: 0.4 10*3/uL (ref 0.1–0.9)
MONO%: 10.7 % (ref 0.0–13.0)
NEUT%: 49.6 % (ref 39.6–80.0)
NEUTROS ABS: 2 10*3/uL (ref 1.5–6.5)
Platelets: 190 10*3/uL (ref 145–400)
RBC: 3.96 10*6/uL (ref 3.70–5.32)
RDW: 12.6 % (ref 11.1–15.7)
WBC: 3.9 10*3/uL (ref 3.9–10.0)

## 2017-02-28 LAB — CMP (CANCER CENTER ONLY)
ALBUMIN: 3 g/dL — AB (ref 3.3–5.5)
ALT(SGPT): 14 U/L (ref 10–47)
AST: 19 U/L (ref 11–38)
Alkaline Phosphatase: 68 U/L (ref 26–84)
BUN, Bld: 16 mg/dL (ref 7–22)
CHLORIDE: 102 meq/L (ref 98–108)
CO2: 30 meq/L (ref 18–33)
CREATININE: 0.6 mg/dL (ref 0.6–1.2)
Calcium: 9 mg/dL (ref 8.0–10.3)
Glucose, Bld: 226 mg/dL — ABNORMAL HIGH (ref 73–118)
Potassium: 3.8 mEq/L (ref 3.3–4.7)
SODIUM: 142 meq/L (ref 128–145)
Total Bilirubin: 0.5 mg/dl (ref 0.20–1.60)
Total Protein: 6.6 g/dL (ref 6.4–8.1)

## 2017-02-28 LAB — IRON AND TIBC
%SAT: 37 % (ref 21–57)
IRON: 76 ug/dL (ref 41–142)
TIBC: 205 ug/dL — AB (ref 236–444)
UIBC: 129 ug/dL (ref 120–384)

## 2017-02-28 LAB — FERRITIN: Ferritin: 104 ng/ml (ref 9–269)

## 2017-02-28 NOTE — Progress Notes (Signed)
Hematology and Oncology Follow Up Visit  [name] [id number] [birth year] 81 y.o. 02/28/2017   Principle Diagnosis:  History of pulmonary embolism and lower extremity thromboembolic disease; recent TIA Iron deficiency anemia secondary to long-term anticoagulant use  Current Therapy:   Xarelto 15 mg by mouth daily IV iron with Feraheme - dose given 11/23/2016   Interim History:  [name] is here today with her caregiver for follow-up. She is doing well and denies fatigue at this time.  She denies bleeding, bruising or petechiae. We will see what her iron studies show.  Hgb is stable at 11.6 with an MCV of 91.  She has had no issue with infection. No fever, chills, n/v, cough, rash, dizziness, SOB, chest pain, palpitations, abdominal pain or changes in bowel or bladder habits.  She has chronic swelling in her lower extremities and this is unchanged. No c/o numbness or tingling in her extremities. No c/o pain.  She has maintained a good appetite but admits that she needs to drink more fluids. Her weight is stable.   ECOG Performance Status: 3 - Symptomatic, >50% confined to bed  Medications:  Allergies as of 02/28/2017   No Known Allergies     Medication List       Accurate as of 02/28/17  1:18 PM. Always use your most recent med list.          ACCU-CHEK SOFTCLIX LANCETS lancets 100 each by Other route daily. Use as instructed Dx: E11.9   acetaminophen 500 MG tablet Commonly known as:  TYLENOL Take 500 mg by mouth every 6 (six) hours as needed (pain).   atorvastatin 20 MG tablet Commonly known as:  LIPITOR Take 1 tablet (20 mg total) by mouth daily at 6 PM.   cholecalciferol 1000 units tablet Commonly known as:  VITAMIN D Take 5,000 Units by mouth daily.   cloNIDine 0.1 MG tablet Commonly known as:  CATAPRES Take 1 tablet (0.1 mg total) by mouth 2 (two) times daily. May give additional 1 tablet daily if SBP >160   diclofenac sodium 1 % Gel Commonly known as:   VOLTAREN Apply topically 3 (three) times daily.   gabapentin 300 MG capsule Commonly known as:  NEURONTIN Take 1 capsule (300 mg total) by mouth 3 (three) times daily.   glucose blood test strip Commonly known as:  ACCU-CHEK AVIVA PLUS 1 each by Other route 2 (two) times daily. E11.9   losartan 25 MG tablet Commonly known as:  COZAAR Take 1 tablet (25 mg total) by mouth daily.   metFORMIN 1000 MG tablet Commonly known as:  GLUCOPHAGE Take 1 tablet (1,000 mg total) by mouth 2 (two) times daily with a meal.   OVER THE COUNTER MEDICATION Take 1 capsule by mouth daily. "ultra flore balance"   Rivaroxaban 15 MG Tabs tablet Commonly known as:  XARELTO Take 1 tablet (15 mg total) by mouth daily with supper.   sitaGLIPtin 50 MG tablet Commonly known as:  JANUVIA Take 1 tablet (50 mg total) by mouth daily.   VITAMIN B COMPLEX PO Take 1 tablet by mouth daily.       Allergies: No Known Allergies  Past Medical History, Surgical history, Social history, and Family History were reviewed and updated.  Review of Systems: All other 10 point review of systems is negative.   Physical Exam:  oral temperature is 98.7 F (37.1 C). Her blood pressure is 164/93 (abnormal) and her pulse is 79. Her respiration is 16.   Wt Readings  from Last 3 Encounters:  11/29/16 128 lb (58.1 kg)  11/22/16 128 lb 11.2 oz (58.4 kg)  10/05/16 128 lb 12 oz (58.4 kg)    Ocular: Sclerae unicteric, pupils equal, round and reactive to light Ear-nose-throat: Oropharynx clear, dentition fair Lymphatic: No cervical, supraclavicular or axillary adenopathy Lungs no rales or rhonchi, good excursion bilaterally Heart regular rate and rhythm, no murmur appreciated Abd soft, nontender, positive bowel sounds, no liver or spleen tip palpated on exam, no fluid wave MSK no focal spinal tenderness, no joint edema Neuro: non-focal, well-oriented, appropriate affect Breasts: Deferred   Lab Results  Component Value  Date   WBC 3.9 02/28/2017   HGB 11.6 02/28/2017   HCT 36.0 02/28/2017   MCV 91 02/28/2017   PLT 190 02/28/2017   Lab Results  Component Value Date   FERRITIN 29 11/08/2016   IRON 41 11/08/2016   TIBC 226 (L) 11/08/2016   UIBC 185 11/08/2016   IRONPCTSAT 18 (L) 11/08/2016   Lab Results  Component Value Date   RBC 3.96 02/28/2017   No results found for: KPAFRELGTCHN, LAMBDASER, KAPLAMBRATIO No results found for: IGGSERUM, IGA, IGMSERUM No results found for: Odetta Pink, SPEI   Chemistry      Component Value Date/Time   NA 142 02/28/2017 1059   K 3.8 02/28/2017 1059   CL 102 02/28/2017 1059   CO2 30 02/28/2017 1059   BUN 16 02/28/2017 1059   CREATININE 0.6 02/28/2017 1059   GLU 140 03/30/2016      Component Value Date/Time   CALCIUM 9.0 02/28/2017 1059   ALKPHOS 68 02/28/2017 1059   AST 19 02/28/2017 1059   ALT 14 02/28/2017 1059   BILITOT 0.50 02/28/2017 1059      Impression and Plan: [name] is a very pleasant 81 yo African American female with history of PE and lower extremity DVT. She is doing well on anticoagulation and has not had any issues with bleeding. Hgb is stable at 11.6 with an MCV of 91. Platelet count is 190.  We will see what her iron studies show and bring her back in for an infusion later this week if needed.  He will contact our office with any questions or concerns. We can certainly see her sooner if need be.  Eliezer Bottom, NP 10/23/20181:18 PM

## 2017-03-01 LAB — ERYTHROPOIETIN: Erythropoietin: 15.2 m[IU]/mL (ref 2.6–18.5)

## 2017-03-01 LAB — RETICULOCYTES: Reticulocyte Count: 1.6 % (ref 0.6–2.6)

## 2017-03-22 ENCOUNTER — Telehealth: Payer: Self-pay | Admitting: *Deleted

## 2017-03-22 NOTE — Telephone Encounter (Signed)
Patient daughter in law, [name] called and stated that she would like a Rx for a piece of equipment to assist the patient with standing. She stated that she does not want a [employer] fast streamer and she wants to rent not buy. Patient stated that [facility] does not have what she wants. I told her to check with a Medical Supply store and find out the name of the equipment she wants a Rx for and let us know and we would send a message to Dr. Eulas Post. She agreed.

## 2017-04-11 ENCOUNTER — Ambulatory Visit (INDEPENDENT_AMBULATORY_CARE_PROVIDER_SITE_OTHER): Payer: Medicare Other | Admitting: Internal Medicine

## 2017-04-11 ENCOUNTER — Encounter: Payer: Self-pay | Admitting: Internal Medicine

## 2017-04-11 VITALS — BP 154/82 | HR 82 | Temp 98.9°F

## 2017-04-11 DIAGNOSIS — I10 Essential (primary) hypertension: Secondary | ICD-10-CM

## 2017-04-11 DIAGNOSIS — D509 Iron deficiency anemia, unspecified: Secondary | ICD-10-CM

## 2017-04-11 DIAGNOSIS — I890 Lymphedema, not elsewhere classified: Secondary | ICD-10-CM

## 2017-04-11 DIAGNOSIS — Z86718 Personal history of other venous thrombosis and embolism: Secondary | ICD-10-CM

## 2017-04-11 DIAGNOSIS — R5381 Other malaise: Secondary | ICD-10-CM

## 2017-04-11 DIAGNOSIS — F0390 Unspecified dementia without behavioral disturbance: Secondary | ICD-10-CM | POA: Diagnosis not present

## 2017-04-11 DIAGNOSIS — I639 Cerebral infarction, unspecified: Secondary | ICD-10-CM

## 2017-04-11 DIAGNOSIS — E782 Mixed hyperlipidemia: Secondary | ICD-10-CM

## 2017-04-11 DIAGNOSIS — E1142 Type 2 diabetes mellitus with diabetic polyneuropathy: Secondary | ICD-10-CM

## 2017-04-11 NOTE — Progress Notes (Signed)
Patient ID: [name], female   DOB: Apr 02, 1936, 81 y.o.   MRN: [id number]   Location:  [facility] OFFICE  Provider: DR Arletha Grippe  Code Status:  Goals of Care:  Advanced Directives 01/10/2017  Does Patient Have a Medical Advance Directive? No  Type of Advance Directive -  Does patient want to make changes to medical advance directive? -  Copy of [place] in Chart? -     Chief Complaint  Patient presents with  . Medical Management of Chronic Issues    3 months follow uo on HTN, Dementia, DVT/ PE    HPI: Patient is a 81 y.o. female seen today for medical management of chronic diseases.  She reports feeling well overall. She completed PT through [facility]. Family member looking to bring private duty PT at least 2 times per week to continue/maintain current activity level. She needs a lift to assist from sit to stand and transfer as well as lift chair. She is currently 2 person assist especially with toileting. Appetite great and she sleeps well. No falls. She is a poor historian due to dementia. Hx obtained from chart.   Left knee pain - controlled on voltaren gel. She had left knee pain and rec'd steroid injection in 08/2016.   Dementia - unchanged. currently not on any medication. Family stopped exelon patch as she was not showing any signs of improvement in QOL. No behavior issues. Albumin 3.0  DM - on metformin. She never started [place]. Takes gabapentin for neuropathy. A1c 8.6%.  Lymphedema - chronic L>RLE. She does not take diuretic. Tried TED stockings in the past but could not tolerate them.   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 No bloody stools/easy bruising  Hyperlipidemia - stable on lipitor. LDL 90. No myalgias  HTN - stable on clonidine and losartan.   Hypokalemia - stable on Kdur. K 3.8  GERD - stable off medication  Hx CVA - stable on xeralto. LDL 90. She did have TIA in May but sx's resolved. She saw neurology who told her to f/u prn. No new  focal deficits  Iron deficiency anemia - stable. followed by hematology. Hgb 11.6; iron 76; ferritin 104  Past Medical History:  Diagnosis Date  . Dementia   . Diabetes mellitus type II 03/21/2011  . DVT (deep venous thrombosis) ([place]) 03/21/2011  . Hyperlipidemia 03/21/2011  . Hypertension 03/21/2011  . Lymphedema of leg 03/21/2011  . Pulmonary embolism ([place]) 03/21/2011    Past Surgical History:  Procedure Laterality Date  . CHOLECYSTECTOMY  2015  . SKIN TAG REMOVAL  07/25/2016  . VAGINAL HYSTERECTOMY     unknown of date  . VASCULAR SURGERY      No Known Allergies  Outpatient Encounter Medications as of 04/11/2017  Medication Sig  . ACCU-CHEK SOFTCLIX LANCETS lancets 100 each by Other route daily. Use as instructed Dx: E11.9  . acetaminophen (TYLENOL) 500 MG tablet Take 500 mg by mouth every 6 (six) hours as needed (pain).   Marland Kitchen atorvastatin (LIPITOR) 20 MG tablet Take 1 tablet (20 mg total) by mouth daily at 6 PM.  . B Complex Vitamins (VITAMIN B COMPLEX PO) Take 1 tablet by mouth daily.   . cholecalciferol (VITAMIN D) 1000 units tablet Take 5,000 Units by mouth daily.   . cloNIDine (CATAPRES) 0.1 MG tablet Take 1 tablet (0.1 mg total) by mouth 2 (two) times daily. May give additional 1 tablet daily if SBP >160  . diclofenac sodium (VOLTAREN)  1 % GEL Apply topically 3 (three) times daily.  Marland Kitchen gabapentin (NEURONTIN) 300 MG capsule Take 1 capsule (300 mg total) by mouth 3 (three) times daily.  Marland Kitchen glucose blood (ACCU-CHEK AVIVA PLUS) test strip 1 each by Other route 2 (two) times daily. E11.9  . losartan (COZAAR) 25 MG tablet Take 1 tablet (25 mg total) by mouth daily.  . metFORMIN (GLUCOPHAGE) 1000 MG tablet Take 1 tablet (1,000 mg total) by mouth 2 (two) times daily with a meal.  . OVER THE COUNTER MEDICATION Take 1 capsule by mouth daily. "ultra flore balance"  . Rivaroxaban (XARELTO) 15 MG TABS tablet Take 1 tablet (15 mg total) by mouth daily with supper.  . sitaGLIPtin  (JANUVIA) 50 MG tablet Take 1 tablet (50 mg total) by mouth daily. (Patient not taking: Reported on 02/28/2017)   No facility-administered encounter medications on file as of 04/11/2017.     Review of Systems:  Review of Systems  Health Maintenance  Topic Date Due  . DEXA SCAN  08/08/2002  . INFLUENZA VACCINE  05/24/2017 (Originally 12/07/2016)  . TETANUS/TDAP  02/05/2018 (Originally 08/07/1956)  . PNA vac Low Risk Adult (1 of 2 - PCV13) 02/05/2018 (Originally 08/08/2002)  . FOOT EXAM  04/11/2018 (Originally 08/08/1947)  . OPHTHALMOLOGY EXAM  04/18/2018 (Originally 08/08/1945)  . HEMOGLOBIN A1C  07/10/2017    Physical Exam: Vitals:   04/11/17 1158  BP: (!) 154/82  Pulse: 82  Temp: 98.9 F (37.2 C)  TempSrc: Oral  SpO2: 98%   There is no height or weight on file to calculate BMI. Physical Exam  Constitutional: She appears well-developed.  Frail appearing in NAD, sitting in w/c  HENT:  Mouth/Throat: Oropharynx is clear and moist. No oropharyngeal exudate.  MMM; no oral thrush  Eyes: Pupils are equal, round, and reactive to light. No scleral icterus.  Neck: Neck supple. Carotid bruit is not present. No tracheal deviation present.  Cardiovascular: Normal rate, regular rhythm and intact distal pulses. Exam reveals no gallop and no friction rub.  Murmur (1/6 SEM) heard. L>R +2 pitting LE edema. No calf TTP  Pulmonary/Chest: Effort normal and breath sounds normal. No stridor. No respiratory distress. She has no wheezes. She has no rales.  Abdominal: Soft. Normal appearance and bowel sounds are normal. She exhibits no distension and no mass. There is no hepatomegaly. There is no tenderness. There is no rigidity, no rebound and no guarding. No hernia.  Musculoskeletal: She exhibits edema and deformity (small and large joint). She exhibits no tenderness.  Lymphadenopathy:    She has no cervical adenopathy.  Neurological: She is alert.  Skin: Skin is warm and dry. No rash noted.    Psychiatric: She has a normal mood and affect. Her behavior is normal.    Labs reviewed: Basic Metabolic Panel: Recent Labs    08/25/16 0403  09/26/16 0843  11/08/16 1156 01/10/17 1230 02/28/17 1059  NA 136   < > 140   < > 138 139 142  K 3.5   < > 4.5   < > 3.9 5.1 3.8  CL 102   < > 102   < > 107 102 102  CO2 26   < > 26   < > 27 21 30   GLUCOSE 214*   < > 138*   < > 220* 191* 226*  BUN 9   < > 11   < > 11 17 16   CREATININE 0.44   < > 0.53*   < > 0.5*  0.53* 0.6  CALCIUM 8.2*   < > 9.4   < > 9.1 9.1 9.0  TSH 1.986  --  3.37  --   --  2.39  --    < > = values in this interval not displayed.   Liver Function Tests: Recent Labs    11/08/16 1156 01/10/17 1230 02/28/17 1059  AST 18 10 19   ALT 9* 8 14  ALKPHOS 64 72 68  BILITOT 0.50 0.3 0.50  PROT 6.7 6.2 6.6  ALBUMIN 2.9* 3.5* 3.0*   No results for input(s): LIPASE, AMYLASE in the last 8760 hours. No results for input(s): AMMONIA in the last 8760 hours. CBC: Recent Labs    11/08/16 1156 12/06/16 1121 02/28/17 1059  WBC 4.8 5.0 3.9  NEUTROABS 2.6 2.7 2.0  HGB 10.9* 12.4 11.6  HCT 34.4* 38.7 36.0  MCV 89 89 91  PLT 206 180 190   Lipid Panel: Recent Labs    05/12/16 0723 09/26/16 0843  CHOL 148 155  HDL 43 53  LDLCALC 96 90  TRIG 46 62  CHOLHDL 3.4 2.9   Lab Results  Component Value Date   HGBA1C 8.6 (H) 01/10/2017    Procedures since last visit: No results found.  Assessment/Plan   ICD-10-CM   1. Type 2 diabetes mellitus with diabetic polyneuropathy, without long-term current use of insulin (HCC) E11.42   2. Physical deconditioning R53.81   3. Lymphedema of left lower extremity I89.0   4. Essential hypertension I10   5. Dementia without behavioral disturbance, unspecified dementia type F03.90   6. Iron deficiency anemia, unspecified iron deficiency anemia type D50.9   7. History of DVT (deep vein thrombosis) Z86.718   8. Mixed hyperlipidemia E78.2    Need urine microalbumin/Cr ratio - she was  unable to provide sample at last ov  Continue current medications as ordered. May take januvia without meal  Follow up with specialists as scheduled  [place] for chair lift  Follow up in 3 mos for DM, dementia, HTN, anemia, hx DVT. Fasting labs prior to appt  [place] [facility] Perlie Gold  [facility] [address] [place],  [address] [phone] Cell (Monday-Friday 8 AM - 5 PM) [phone] After 5 PM and follow prompts

## 2017-04-11 NOTE — Patient Instructions (Addendum)
Continue current medications as ordered. May take januvia without meal  Follow up with specialists as scheduled  [place] for chair lift  Follow up in 3 mos for DM, dementia, HTN, anemia, hx DVT. Fasting labs prior to appt

## 2017-04-12 ENCOUNTER — Encounter: Payer: Self-pay | Admitting: Internal Medicine

## 2017-04-19 ENCOUNTER — Telehealth: Payer: Self-pay | Admitting: *Deleted

## 2017-04-19 MED ORDER — COLCHICINE 0.6 MG PO TABS: ORAL_TABLET | ORAL | 0 refills | Status: DC

## 2017-04-19 NOTE — Telephone Encounter (Signed)
Patient daughter notified and agreed. Faxed Rx to pharmacy.  

## 2017-04-19 NOTE — Telephone Encounter (Signed)
[name], daughter in law called and stated that patient was having Right wrist Pain for the last few days. A little swelling, no redness, no trauma. Daughter in [place] is wondering if it is gout. Had in ER in February for Left Wrist pain and diagnosed with gout. Stated she would like your advise. Please Advise.   I offered an appointment for today, this week but refused due to weather. Stated she could not get out due to wheelchair, snow and ice.

## 2017-04-19 NOTE — Telephone Encounter (Signed)
It is possible that it is gout flare but cannot make that determination without office visit. [place] for colcrys #6 take 1 tab po daily x 3 days for gout flare with no rf

## 2017-04-20 ENCOUNTER — Encounter (HOSPITAL_BASED_OUTPATIENT_CLINIC_OR_DEPARTMENT_OTHER): Payer: Self-pay | Admitting: *Deleted

## 2017-04-20 ENCOUNTER — Emergency Department (HOSPITAL_BASED_OUTPATIENT_CLINIC_OR_DEPARTMENT_OTHER)
Admission: EM | Admit: 2017-04-20 | Discharge: 2017-04-20 | Disposition: A | Discharge status: Home / Self Care | Payer: Medicare Other | Attending: Physician Assistant | Admitting: Physician Assistant

## 2017-04-20 ENCOUNTER — Emergency Department (HOSPITAL_BASED_OUTPATIENT_CLINIC_OR_DEPARTMENT_OTHER): Payer: Medicare Other

## 2017-04-20 ENCOUNTER — Other Ambulatory Visit: Payer: Self-pay

## 2017-04-20 DIAGNOSIS — Z79899 Other long term (current) drug therapy: Secondary | ICD-10-CM | POA: Insufficient documentation

## 2017-04-20 DIAGNOSIS — I10 Essential (primary) hypertension: Secondary | ICD-10-CM | POA: Insufficient documentation

## 2017-04-20 DIAGNOSIS — E119 Type 2 diabetes mellitus without complications: Secondary | ICD-10-CM | POA: Insufficient documentation

## 2017-04-20 DIAGNOSIS — Z7901 Long term (current) use of anticoagulants: Secondary | ICD-10-CM | POA: Diagnosis not present

## 2017-04-20 DIAGNOSIS — M25531 Pain in right wrist: Secondary | ICD-10-CM | POA: Diagnosis not present

## 2017-04-20 DIAGNOSIS — Z8673 Personal history of transient ischemic attack (TIA), and cerebral infarction without residual deficits: Secondary | ICD-10-CM | POA: Diagnosis not present

## 2017-04-20 DIAGNOSIS — F0390 Unspecified dementia without behavioral disturbance: Secondary | ICD-10-CM | POA: Insufficient documentation

## 2017-04-20 DIAGNOSIS — Z7984 Long term (current) use of oral hypoglycemic drugs: Secondary | ICD-10-CM | POA: Insufficient documentation

## 2017-04-20 DIAGNOSIS — M109 Gout, unspecified: Secondary | ICD-10-CM

## 2017-04-20 DIAGNOSIS — M7989 Other specified soft tissue disorders: Secondary | ICD-10-CM | POA: Diagnosis not present

## 2017-04-20 NOTE — Telephone Encounter (Signed)
[name] called and stated that patient took the Colcrys last night but patient's wrist is no better this morning and has swollen even more. Going to take her to Urgent Care so she only has to go to 1 place to be seen and for an x-ray. Agreed.

## 2017-04-20 NOTE — Discharge Instructions (Signed)
[name] was seen today for ongoing right wrist pain for the past couple of days.  Given her history of gout this is most likely a gout attack.  She should continue taking the colchicine that she was prescribed by her primary doctor.  If her symptoms do not improve I would recommend that she follows up with her primary doctor.  She did have a finding on x-ray that was concerning for a disassociation between 2 of the wrist bones.  I am recommending that she follow-up with Dr. Karlton Lemon who is a sports medicine doctor who works in the same building upstairs.

## 2017-04-20 NOTE — ED Triage Notes (Addendum)
Right hand pain with swelling x 3 days. No injury. Hx of blood blots in the past. She takes blood thinners. Her MD called in medication for gout last night and she has had one tablet with some improvement.

## 2017-04-20 NOTE — ED Provider Notes (Signed)
[facility] EMERGENCY DEPARTMENT Provider Note   CSN: [id number] Arrival date & time: 04/20/17  1335     History   Chief Complaint Chief Complaint  Patient presents with  . Hand Pain    HPI [name] is a 81 y.o. female.  [name] is a 81 year old female presents with 2 days of right wrist pain.  Is accompanied by her daughter-in-law reports that the pain was significant yesterday and she called the primary care doctor who empirically prescribed colchicine given her history of gout.  She took one dose yesterday and reports improved symptoms today.  Reportedly primary care doctor wanted her to be seen and she was unable to make that appointment so she came in to the emergency department.  Patient denies any pain with range of motion, denies any fevers or chills.  Of note patient does have significant dementia and her daughter-in-law help with history of present illness.  Apparently wrist was exquisitely tender to touch and with passive and active range of motion history.  Patient does not currently take any kind of maintenance medicine for gout.  Her last episode was last year for which she was given a course of steroids and responded well.        Past Medical History:  Diagnosis Date  . Dementia   . Diabetes mellitus type II 03/21/2011  . DVT (deep venous thrombosis) ([place]) 03/21/2011  . Hyperlipidemia 03/21/2011  . Hypertension 03/21/2011  . Lymphedema of leg 03/21/2011  . Pulmonary embolism ([place]) 03/21/2011    Patient Active Problem List   Diagnosis Date Noted  . History of TIA (transient ischemic attack) 01/10/2017  . Atrial septal aneurysm 11/22/2016  . Bilateral lower extremity edema 11/22/2016  . Iron deficiency anemia due to chronic blood loss 11/16/2016  . TIA (transient ischemic attack) 10/05/2016  . Lower urinary tract infectious disease   . Current use of long term anticoagulation 08/24/2016  . Metabolic encephalopathy 69/67/8938  . Senile  dementia without behavioral disturbance 08/23/2016  . Sepsis ([place]) 08/23/2016  . Acute CVA (cerebrovascular accident) ([place])   . Left-sided weakness 05/11/2016  . Dysarthria 05/11/2016  . Acute left-sided weakness 05/11/2016  . History of pulmonary embolism 03/21/2011  . History of DVT (deep vein thrombosis) 03/21/2011  . Type 2 diabetes mellitus with diabetic polyneuropathy, without long-term current use of insulin ([place]) 03/21/2011  . Lymphedema of leg 03/21/2011  . Hyperlipidemia 03/21/2011  . Hypertension 03/21/2011    Past Surgical History:  Procedure Laterality Date  . CHOLECYSTECTOMY  2015  . SKIN TAG REMOVAL  07/25/2016  . VAGINAL HYSTERECTOMY     unknown of date  . VASCULAR SURGERY      OB History    Gravida Para Term Preterm AB Living   3 3 3     3    SAB TAB Ectopic Multiple Live Births           3       Home Medications    Prior to Admission medications   Medication Sig Start Date End Date Taking? Authorizing Provider  ACCU-CHEK SOFTCLIX LANCETS lancets 100 each by Other route daily. Use as instructed Dx: E11.9 01/10/17   Gildardo Cranker, DO  acetaminophen (TYLENOL) 500 MG tablet Take 500 mg by mouth every 6 (six) hours as needed (pain).     [provider]  atorvastatin (LIPITOR) 20 MG tablet Take 1 tablet (20 mg total) by mouth daily at 6 PM. 01/10/17   Gildardo Cranker, DO  B  Complex Vitamins (VITAMIN B COMPLEX PO) Take 1 tablet by mouth daily.     [provider]  cholecalciferol (VITAMIN D) 1000 units tablet Take 5,000 Units by mouth daily.     [provider]  cloNIDine (CATAPRES) 0.1 MG tablet Take 1 tablet (0.1 mg total) by mouth 2 (two) times daily. May give additional 1 tablet daily if SBP >160 01/10/17   Gildardo Cranker, DO  colchicine (COLCRYS) 0.6 MG tablet Take one tablet by mouth once daily for 3 days for gout flare 04/19/17   Gildardo Cranker, DO  diclofenac sodium (VOLTAREN) 1 % GEL Apply topically 3 (three) times daily.    [provider]  gabapentin (NEURONTIN) 300 MG capsule Take 1 capsule (300 mg total) by mouth 3 (three) times daily. 01/10/17   Gildardo Cranker, DO  glucose blood (ACCU-CHEK AVIVA PLUS) test strip 1 each by Other route 2 (two) times daily. E11.9 01/10/17   Gildardo Cranker, DO  losartan (COZAAR) 25 MG tablet Take 1 tablet (25 mg total) by mouth daily. 01/10/17   Gildardo Cranker, DO  metFORMIN (GLUCOPHAGE) 1000 MG tablet Take 1 tablet (1,000 mg total) by mouth 2 (two) times daily with a meal. 01/10/17   Gildardo Cranker, DO  OVER THE COUNTER MEDICATION Take 1 capsule by mouth daily. "ultra flore balance"    [provider]  Rivaroxaban (XARELTO) 15 MG TABS tablet Take 1 tablet (15 mg total) by mouth daily with supper. 01/10/17   Gildardo Cranker, DO  sitaGLIPtin (JANUVIA) 50 MG tablet Take 1 tablet (50 mg total) by mouth daily. Patient not taking: Reported on 02/28/2017 01/16/17   Gildardo Cranker, DO    Family History Family History  Problem Relation Age of Onset  . Heart attack Mother   . COPD Sister   . Stroke Sister   . Bipolar disorder Daughter     Social History Social History   Tobacco Use  . Smoking status: Never Smoker  . Smokeless tobacco: Never Used  Substance Use Topics  . Alcohol use: No  . Drug use: No     Allergies   Patient has no known allergies.   Review of Systems Review of Systems  Constitutional: Negative.  Negative for chills, diaphoresis and fever.  HENT: Negative.   Eyes: Negative.   Respiratory: Negative.   Cardiovascular: Negative.   Gastrointestinal: Negative.   Endocrine: Negative.   Musculoskeletal: Positive for arthralgias and joint swelling.  Allergic/Immunologic: Negative.      Physical Exam Updated Vital Signs BP (!) 185/103 (BP Location: Left Arm)   Pulse 81   Temp (!) 97.5 F (36.4 C) (Oral)   Resp 16   Ht 5\' 6"  (1.676 m)   Wt 58.1 kg (128 lb)   SpO2 100%   BMI 20.66 kg/m   Physical Exam  Constitutional: She is oriented to person,  place, and time. She appears well-developed and well-nourished. No distress.  HENT:  Head: Normocephalic and atraumatic.  Eyes: Conjunctivae and EOM are normal. Pupils are equal, round, and reactive to light.  Neck: Normal range of motion. Neck supple.  Cardiovascular: Normal rate and regular rhythm. Exam reveals no friction rub.  No murmur heard. Pulmonary/Chest: Effort normal and breath sounds normal. No respiratory distress. She has no wheezes.  Abdominal: Soft. She exhibits no distension. There is no tenderness.  Musculoskeletal: Normal range of motion.  Right wrist with full active and passive range of motion.  Does have tenderness to palpation at the [facility] joint at the  anatomical snuffbox at the area of the scaphoid.  Additionally has some erythema, warmth and mild edema in the general area.  No pain with resisted flexion or extension ulnar or radial deviation.   Lymphadenopathy:    She has no cervical adenopathy.  Neurological: She is alert and oriented to person, place, and time.  Right grip strength 4-5.  Right wrist resisted flexion extension 4 out of 5.   Skin: Skin is warm and dry. Capillary refill takes less than 2 seconds. No rash noted. She is not diaphoretic.  Psychiatric: She has a normal mood and affect.     ED Treatments / Results  Labs (all labs ordered are listed, but only abnormal results are displayed) Labs Reviewed - No data to display  EKG  EKG Interpretation None       Radiology Dg Wrist Complete Right  Result Date: 04/20/2017 CLINICAL DATA:  Redness and swelling. EXAM: RIGHT WRIST - COMPLETE 3+ VIEW COMPARISON:  No recent prior. FINDINGS: Diffuse soft tissue swelling. Diffuse osteopenia and degenerative change. No evidence of fracture dislocation. Scapholunate space widening noted suggesting scapholunate dissociation. Prominent subchondral corticated cyst noted of the lunate, this is most likely degenerative. Loose bodies appear to be present.  Cartilaginous calcification noted most likely degenerative. IMPRESSION: 1. 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 No evidence of fracture or dislocation. 2. Severe degenerative changes right wrist. Prominent corticated cyst noted in the lunate. This is most likely degenerative. Loose bodies most likely present . 3. Diffuse soft tissue swelling . MRI of the right wrist may prove useful for further evaluation. Electronically Signed   By: Marcello Moores  Register   On: 04/20/2017 14:52    Procedures Procedures (including critical care time)  Medications Ordered in ED Medications - No data to display   Initial Impression / Assessment and Plan / ED Course  I have reviewed the triage vital signs and the nursing notes.  Pertinent labs & imaging results that were available during my care of the patient were reviewed by me and considered in my medical decision making (see chart for details).    81 year old female with acute right wrist pain, warmth, edema and with history of gout most likely presenting with gout flare.  Patient called primary care doctor yesterday who started patient on a course of colchicine.  She reports significant improvement of symptoms over 24 hours after starting colchicine.  Had x-ray showing severe degenerative changes in the right wrist as well as nontraumatic dissociation between the scaphoid and lunate on the right side.  Unsure what to make of this finding as caretaker denied any recent trauma patient has full range of motion with minimal tenderness to palpation on exam.  Symptoms should significantly improve with course of colchicine.  Recommended that she follow-up with primary care doctor if symptoms do not significantly improve over the weekend.  Also recommended the patient follow-up with Dr. Karlton Lemon sports medicine physician for further evaluation after acute gout flare resolves.    Final Clinical Impressions(s) / ED Diagnoses   Final  diagnoses:  Acute gout of right wrist, unspecified cause  Right wrist pain    ED Discharge Orders    None       Eloise Levels, MD 04/20/17 1614    Macarthur Critchley, MD 04/24/17 0005

## 2017-04-20 NOTE — Telephone Encounter (Signed)
noted 

## 2017-04-20 NOTE — ED Notes (Signed)
Patient transported to X-ray 

## 2017-05-23 ENCOUNTER — Telehealth: Payer: Self-pay | Admitting: *Deleted

## 2017-05-23 ENCOUNTER — Other Ambulatory Visit: Payer: Medicare Other

## 2017-05-23 ENCOUNTER — Ambulatory Visit: Payer: Medicare Other | Admitting: Family

## 2017-05-23 DIAGNOSIS — S31809A Unspecified open wound of unspecified buttock, initial encounter: Secondary | ICD-10-CM

## 2017-05-23 NOTE — Telephone Encounter (Signed)
Daughter, [name] called and stated that patient is having some breakdown on her bottom. Stated that the nurse aid looked at it and it looks to be a Stage II. No Redness and No drainage. No fever. Pinkish in color and Peeling. The OTC medication is not working and the nurse aid suggested her calling to get a Rx.  I offered an appointment for her to bring patient in but she stated it was too hard to get patient here and wants to know if you would call something in. Stated if you needed she could show pictures. Please Advise.

## 2017-05-23 NOTE — Telephone Encounter (Signed)
Referral placed and daughter notified.

## 2017-05-23 NOTE — Telephone Encounter (Signed)
Recommend [place] RN to evaluate wound and treat accordingly

## 2017-05-27 DIAGNOSIS — I10 Essential (primary) hypertension: Secondary | ICD-10-CM | POA: Diagnosis not present

## 2017-05-27 DIAGNOSIS — R2681 Unsteadiness on feet: Secondary | ICD-10-CM | POA: Diagnosis not present

## 2017-05-27 DIAGNOSIS — F0390 Unspecified dementia without behavioral disturbance: Secondary | ICD-10-CM | POA: Diagnosis not present

## 2017-05-27 DIAGNOSIS — E1142 Type 2 diabetes mellitus with diabetic polyneuropathy: Secondary | ICD-10-CM | POA: Diagnosis not present

## 2017-05-27 DIAGNOSIS — L89152 Pressure ulcer of sacral region, stage 2: Secondary | ICD-10-CM | POA: Diagnosis not present

## 2017-05-27 DIAGNOSIS — Z7984 Long term (current) use of oral hypoglycemic drugs: Secondary | ICD-10-CM | POA: Diagnosis not present

## 2017-05-27 DIAGNOSIS — R5381 Other malaise: Secondary | ICD-10-CM | POA: Diagnosis not present

## 2017-05-27 DIAGNOSIS — L89312 Pressure ulcer of right buttock, stage 2: Secondary | ICD-10-CM | POA: Diagnosis not present

## 2017-05-31 DIAGNOSIS — L89152 Pressure ulcer of sacral region, stage 2: Secondary | ICD-10-CM | POA: Diagnosis not present

## 2017-05-31 DIAGNOSIS — R5381 Other malaise: Secondary | ICD-10-CM | POA: Diagnosis not present

## 2017-05-31 DIAGNOSIS — F0390 Unspecified dementia without behavioral disturbance: Secondary | ICD-10-CM | POA: Diagnosis not present

## 2017-05-31 DIAGNOSIS — I10 Essential (primary) hypertension: Secondary | ICD-10-CM | POA: Diagnosis not present

## 2017-05-31 DIAGNOSIS — L89312 Pressure ulcer of right buttock, stage 2: Secondary | ICD-10-CM | POA: Diagnosis not present

## 2017-05-31 DIAGNOSIS — E1142 Type 2 diabetes mellitus with diabetic polyneuropathy: Secondary | ICD-10-CM | POA: Diagnosis not present

## 2017-06-02 DIAGNOSIS — E1142 Type 2 diabetes mellitus with diabetic polyneuropathy: Secondary | ICD-10-CM | POA: Diagnosis not present

## 2017-06-02 DIAGNOSIS — R5381 Other malaise: Secondary | ICD-10-CM | POA: Diagnosis not present

## 2017-06-02 DIAGNOSIS — L89152 Pressure ulcer of sacral region, stage 2: Secondary | ICD-10-CM | POA: Diagnosis not present

## 2017-06-02 DIAGNOSIS — L89312 Pressure ulcer of right buttock, stage 2: Secondary | ICD-10-CM | POA: Diagnosis not present

## 2017-06-02 DIAGNOSIS — I10 Essential (primary) hypertension: Secondary | ICD-10-CM | POA: Diagnosis not present

## 2017-06-02 DIAGNOSIS — F0390 Unspecified dementia without behavioral disturbance: Secondary | ICD-10-CM | POA: Diagnosis not present

## 2017-06-05 ENCOUNTER — Telehealth: Payer: Self-pay | Admitting: *Deleted

## 2017-06-05 DIAGNOSIS — E1142 Type 2 diabetes mellitus with diabetic polyneuropathy: Secondary | ICD-10-CM | POA: Diagnosis not present

## 2017-06-05 DIAGNOSIS — L89312 Pressure ulcer of right buttock, stage 2: Secondary | ICD-10-CM | POA: Diagnosis not present

## 2017-06-05 DIAGNOSIS — R5381 Other malaise: Secondary | ICD-10-CM | POA: Diagnosis not present

## 2017-06-05 DIAGNOSIS — F0390 Unspecified dementia without behavioral disturbance: Secondary | ICD-10-CM | POA: Diagnosis not present

## 2017-06-05 DIAGNOSIS — I10 Essential (primary) hypertension: Secondary | ICD-10-CM | POA: Diagnosis not present

## 2017-06-05 DIAGNOSIS — L89152 Pressure ulcer of sacral region, stage 2: Secondary | ICD-10-CM | POA: Diagnosis not present

## 2017-06-05 NOTE — Telephone Encounter (Signed)
[name] with [facility] called and stated that she needed orders signed and OV notes faxed to her due to patient request for [employer]. [name] is faxing orders and I will attach OV notes and place in Dr. Vale Haven folder to review and sign. Awaiting orders to be faxed.

## 2017-06-05 NOTE — Telephone Encounter (Signed)
Received and placed in Dr. Vale Haven folder to review and sign.

## 2017-06-07 ENCOUNTER — Telehealth: Payer: Self-pay

## 2017-06-07 DIAGNOSIS — I10 Essential (primary) hypertension: Secondary | ICD-10-CM | POA: Diagnosis not present

## 2017-06-07 DIAGNOSIS — L89152 Pressure ulcer of sacral region, stage 2: Secondary | ICD-10-CM | POA: Diagnosis not present

## 2017-06-07 DIAGNOSIS — L89312 Pressure ulcer of right buttock, stage 2: Secondary | ICD-10-CM | POA: Diagnosis not present

## 2017-06-07 DIAGNOSIS — E1142 Type 2 diabetes mellitus with diabetic polyneuropathy: Secondary | ICD-10-CM | POA: Diagnosis not present

## 2017-06-07 DIAGNOSIS — R5381 Other malaise: Secondary | ICD-10-CM | POA: Diagnosis not present

## 2017-06-07 DIAGNOSIS — F0390 Unspecified dementia without behavioral disturbance: Secondary | ICD-10-CM | POA: Diagnosis not present

## 2017-06-07 NOTE — Telephone Encounter (Signed)
Betsy with [facility] called to ask for verbal orders for the following;  Continue PT  2 times per week for 4 weeks.  Verbal order given.

## 2017-06-09 ENCOUNTER — Telehealth: Payer: Self-pay | Admitting: *Deleted

## 2017-06-09 DIAGNOSIS — Z029 Encounter for administrative examinations, unspecified: Secondary | ICD-10-CM

## 2017-06-09 NOTE — Telephone Encounter (Signed)
Received fax from [facility] [phone]. Attending 45 Statement for [place]. Claim to determine the Insured's eligibility for benefits. Attached signed Authorization for Release.  Form, OV note and Demographics printed and placed in Dr. Vale Haven folder to review, fill out and sign.  To be faxed back to Fax: [phone]  Claim #:[id number] Policy #: [id number]

## 2017-06-12 NOTE — Telephone Encounter (Signed)
Betsy with [facility] called and left message on Clinical Intake stating she needed verbal orders for PT 2X4wks.   Verbal orders were already given on 06/07/17 by Coralyn Mark. Called and LM on her voicemail with this confirmation.

## 2017-06-14 DIAGNOSIS — L89152 Pressure ulcer of sacral region, stage 2: Secondary | ICD-10-CM | POA: Diagnosis not present

## 2017-06-14 DIAGNOSIS — L89312 Pressure ulcer of right buttock, stage 2: Secondary | ICD-10-CM | POA: Diagnosis not present

## 2017-06-14 DIAGNOSIS — F0390 Unspecified dementia without behavioral disturbance: Secondary | ICD-10-CM | POA: Diagnosis not present

## 2017-06-14 DIAGNOSIS — I10 Essential (primary) hypertension: Secondary | ICD-10-CM | POA: Diagnosis not present

## 2017-06-14 DIAGNOSIS — R5381 Other malaise: Secondary | ICD-10-CM | POA: Diagnosis not present

## 2017-06-14 DIAGNOSIS — E1142 Type 2 diabetes mellitus with diabetic polyneuropathy: Secondary | ICD-10-CM | POA: Diagnosis not present

## 2017-06-15 DIAGNOSIS — L89152 Pressure ulcer of sacral region, stage 2: Secondary | ICD-10-CM | POA: Diagnosis not present

## 2017-06-15 DIAGNOSIS — E1142 Type 2 diabetes mellitus with diabetic polyneuropathy: Secondary | ICD-10-CM | POA: Diagnosis not present

## 2017-06-15 DIAGNOSIS — L89312 Pressure ulcer of right buttock, stage 2: Secondary | ICD-10-CM | POA: Diagnosis not present

## 2017-06-15 DIAGNOSIS — F0390 Unspecified dementia without behavioral disturbance: Secondary | ICD-10-CM | POA: Diagnosis not present

## 2017-06-15 DIAGNOSIS — I10 Essential (primary) hypertension: Secondary | ICD-10-CM | POA: Diagnosis not present

## 2017-06-15 DIAGNOSIS — R5381 Other malaise: Secondary | ICD-10-CM | POA: Diagnosis not present

## 2017-06-15 DIAGNOSIS — M1712 Unilateral primary osteoarthritis, left knee: Secondary | ICD-10-CM | POA: Diagnosis not present

## 2017-06-16 DIAGNOSIS — E1142 Type 2 diabetes mellitus with diabetic polyneuropathy: Secondary | ICD-10-CM | POA: Diagnosis not present

## 2017-06-16 DIAGNOSIS — F0390 Unspecified dementia without behavioral disturbance: Secondary | ICD-10-CM | POA: Diagnosis not present

## 2017-06-16 DIAGNOSIS — L89312 Pressure ulcer of right buttock, stage 2: Secondary | ICD-10-CM | POA: Diagnosis not present

## 2017-06-16 DIAGNOSIS — L89152 Pressure ulcer of sacral region, stage 2: Secondary | ICD-10-CM | POA: Diagnosis not present

## 2017-06-16 DIAGNOSIS — I10 Essential (primary) hypertension: Secondary | ICD-10-CM | POA: Diagnosis not present

## 2017-06-16 DIAGNOSIS — R5381 Other malaise: Secondary | ICD-10-CM | POA: Diagnosis not present

## 2017-06-19 DIAGNOSIS — I10 Essential (primary) hypertension: Secondary | ICD-10-CM | POA: Diagnosis not present

## 2017-06-19 DIAGNOSIS — R5381 Other malaise: Secondary | ICD-10-CM | POA: Diagnosis not present

## 2017-06-19 DIAGNOSIS — E1142 Type 2 diabetes mellitus with diabetic polyneuropathy: Secondary | ICD-10-CM | POA: Diagnosis not present

## 2017-06-19 DIAGNOSIS — L89152 Pressure ulcer of sacral region, stage 2: Secondary | ICD-10-CM | POA: Diagnosis not present

## 2017-06-19 DIAGNOSIS — F0390 Unspecified dementia without behavioral disturbance: Secondary | ICD-10-CM | POA: Diagnosis not present

## 2017-06-19 DIAGNOSIS — L89312 Pressure ulcer of right buttock, stage 2: Secondary | ICD-10-CM | POA: Diagnosis not present

## 2017-06-21 ENCOUNTER — Telehealth: Payer: Self-pay | Admitting: *Deleted

## 2017-06-21 DIAGNOSIS — E1142 Type 2 diabetes mellitus with diabetic polyneuropathy: Secondary | ICD-10-CM | POA: Diagnosis not present

## 2017-06-21 DIAGNOSIS — I10 Essential (primary) hypertension: Secondary | ICD-10-CM | POA: Diagnosis not present

## 2017-06-21 DIAGNOSIS — R5381 Other malaise: Secondary | ICD-10-CM | POA: Diagnosis not present

## 2017-06-21 DIAGNOSIS — F0390 Unspecified dementia without behavioral disturbance: Secondary | ICD-10-CM | POA: Diagnosis not present

## 2017-06-21 DIAGNOSIS — L89152 Pressure ulcer of sacral region, stage 2: Secondary | ICD-10-CM | POA: Diagnosis not present

## 2017-06-21 DIAGNOSIS — L89312 Pressure ulcer of right buttock, stage 2: Secondary | ICD-10-CM | POA: Diagnosis not present

## 2017-06-21 NOTE — Telephone Encounter (Signed)
[name] with [facility] called and stated that it was protocol to call with elevated blood pressure.   Left Arm: 178/90 Right Arm: 178/80 No other concerns noted.

## 2017-06-22 NOTE — Telephone Encounter (Signed)
Noted  

## 2017-06-26 ENCOUNTER — Telehealth: Payer: Self-pay | Admitting: *Deleted

## 2017-06-26 ENCOUNTER — Telehealth: Payer: Self-pay

## 2017-06-26 DIAGNOSIS — F0390 Unspecified dementia without behavioral disturbance: Secondary | ICD-10-CM | POA: Diagnosis not present

## 2017-06-26 DIAGNOSIS — R5381 Other malaise: Secondary | ICD-10-CM | POA: Diagnosis not present

## 2017-06-26 DIAGNOSIS — L89152 Pressure ulcer of sacral region, stage 2: Secondary | ICD-10-CM | POA: Diagnosis not present

## 2017-06-26 DIAGNOSIS — L89312 Pressure ulcer of right buttock, stage 2: Secondary | ICD-10-CM | POA: Diagnosis not present

## 2017-06-26 DIAGNOSIS — E1142 Type 2 diabetes mellitus with diabetic polyneuropathy: Secondary | ICD-10-CM | POA: Diagnosis not present

## 2017-06-26 DIAGNOSIS — I10 Essential (primary) hypertension: Secondary | ICD-10-CM | POA: Diagnosis not present

## 2017-06-26 NOTE — Telephone Encounter (Signed)
Left message on voicemail with Dr.Carter's response. Instructed [name] to return call if additional questions or concerns

## 2017-06-26 NOTE — Telephone Encounter (Signed)
Carlean Jews called to see if Dr.Carter completed insurance paperwork sent to office earlier this month.   Per Telephone encounter from [facility] on 06/09/17 paperwork was received and given to [place] for completion. I was unable to track paperwork's location (not scanned into system).  [name] states she needs to confirm that paperwork was completed.   Dr.Carter please advise if you recall completing paperwork

## 2017-06-26 NOTE — Telephone Encounter (Signed)
Betsy with [facility] called and requested Verbal orders for OT Evaluation to access ADLs and Feedings.  Verbal order given.

## 2017-06-26 NOTE — Telephone Encounter (Signed)
Yes paperwork was completed - I put it in the [place] folder some time ago

## 2017-06-28 DIAGNOSIS — I10 Essential (primary) hypertension: Secondary | ICD-10-CM | POA: Diagnosis not present

## 2017-06-28 DIAGNOSIS — L89152 Pressure ulcer of sacral region, stage 2: Secondary | ICD-10-CM | POA: Diagnosis not present

## 2017-06-28 DIAGNOSIS — L89312 Pressure ulcer of right buttock, stage 2: Secondary | ICD-10-CM | POA: Diagnosis not present

## 2017-06-28 DIAGNOSIS — R5381 Other malaise: Secondary | ICD-10-CM | POA: Diagnosis not present

## 2017-06-28 DIAGNOSIS — F0390 Unspecified dementia without behavioral disturbance: Secondary | ICD-10-CM | POA: Diagnosis not present

## 2017-06-28 DIAGNOSIS — E1142 Type 2 diabetes mellitus with diabetic polyneuropathy: Secondary | ICD-10-CM | POA: Diagnosis not present

## 2017-06-29 ENCOUNTER — Other Ambulatory Visit: Payer: Self-pay | Admitting: Internal Medicine

## 2017-06-29 DIAGNOSIS — E1142 Type 2 diabetes mellitus with diabetic polyneuropathy: Secondary | ICD-10-CM

## 2017-06-29 DIAGNOSIS — E782 Mixed hyperlipidemia: Secondary | ICD-10-CM

## 2017-06-30 DIAGNOSIS — R5381 Other malaise: Secondary | ICD-10-CM | POA: Diagnosis not present

## 2017-06-30 DIAGNOSIS — L89152 Pressure ulcer of sacral region, stage 2: Secondary | ICD-10-CM | POA: Diagnosis not present

## 2017-06-30 DIAGNOSIS — I10 Essential (primary) hypertension: Secondary | ICD-10-CM | POA: Diagnosis not present

## 2017-06-30 DIAGNOSIS — E1142 Type 2 diabetes mellitus with diabetic polyneuropathy: Secondary | ICD-10-CM | POA: Diagnosis not present

## 2017-06-30 DIAGNOSIS — F0390 Unspecified dementia without behavioral disturbance: Secondary | ICD-10-CM | POA: Diagnosis not present

## 2017-06-30 DIAGNOSIS — L89312 Pressure ulcer of right buttock, stage 2: Secondary | ICD-10-CM | POA: Diagnosis not present

## 2017-07-03 DIAGNOSIS — F0390 Unspecified dementia without behavioral disturbance: Secondary | ICD-10-CM | POA: Diagnosis not present

## 2017-07-03 DIAGNOSIS — I10 Essential (primary) hypertension: Secondary | ICD-10-CM | POA: Diagnosis not present

## 2017-07-03 DIAGNOSIS — E1142 Type 2 diabetes mellitus with diabetic polyneuropathy: Secondary | ICD-10-CM | POA: Diagnosis not present

## 2017-07-03 DIAGNOSIS — L89152 Pressure ulcer of sacral region, stage 2: Secondary | ICD-10-CM | POA: Diagnosis not present

## 2017-07-03 DIAGNOSIS — R5381 Other malaise: Secondary | ICD-10-CM | POA: Diagnosis not present

## 2017-07-03 DIAGNOSIS — L89312 Pressure ulcer of right buttock, stage 2: Secondary | ICD-10-CM | POA: Diagnosis not present

## 2017-07-04 DIAGNOSIS — F0390 Unspecified dementia without behavioral disturbance: Secondary | ICD-10-CM | POA: Diagnosis not present

## 2017-07-04 DIAGNOSIS — R5381 Other malaise: Secondary | ICD-10-CM | POA: Diagnosis not present

## 2017-07-04 DIAGNOSIS — L89312 Pressure ulcer of right buttock, stage 2: Secondary | ICD-10-CM | POA: Diagnosis not present

## 2017-07-04 DIAGNOSIS — I10 Essential (primary) hypertension: Secondary | ICD-10-CM | POA: Diagnosis not present

## 2017-07-04 DIAGNOSIS — L89152 Pressure ulcer of sacral region, stage 2: Secondary | ICD-10-CM | POA: Diagnosis not present

## 2017-07-04 DIAGNOSIS — E1142 Type 2 diabetes mellitus with diabetic polyneuropathy: Secondary | ICD-10-CM | POA: Diagnosis not present

## 2017-07-05 DIAGNOSIS — R5381 Other malaise: Secondary | ICD-10-CM | POA: Diagnosis not present

## 2017-07-05 DIAGNOSIS — L89312 Pressure ulcer of right buttock, stage 2: Secondary | ICD-10-CM | POA: Diagnosis not present

## 2017-07-05 DIAGNOSIS — L89152 Pressure ulcer of sacral region, stage 2: Secondary | ICD-10-CM | POA: Diagnosis not present

## 2017-07-05 DIAGNOSIS — F0390 Unspecified dementia without behavioral disturbance: Secondary | ICD-10-CM | POA: Diagnosis not present

## 2017-07-05 DIAGNOSIS — I10 Essential (primary) hypertension: Secondary | ICD-10-CM | POA: Diagnosis not present

## 2017-07-05 DIAGNOSIS — E1142 Type 2 diabetes mellitus with diabetic polyneuropathy: Secondary | ICD-10-CM | POA: Diagnosis not present

## 2017-07-06 ENCOUNTER — Other Ambulatory Visit: Payer: Self-pay | Admitting: *Deleted

## 2017-07-06 ENCOUNTER — Other Ambulatory Visit: Payer: Medicare Other

## 2017-07-06 DIAGNOSIS — Z86718 Personal history of other venous thrombosis and embolism: Secondary | ICD-10-CM

## 2017-07-06 DIAGNOSIS — I10 Essential (primary) hypertension: Secondary | ICD-10-CM

## 2017-07-06 MED ORDER — RIVAROXABAN 15 MG PO TABS: 15.0000 mg | ORAL_TABLET | Freq: Every day | ORAL | 1 refills | Status: DC

## 2017-07-06 MED ORDER — LOSARTAN POTASSIUM 25 MG PO TABS: 25.0000 mg | ORAL_TABLET | Freq: Every day | ORAL | 1 refills | Status: DC

## 2017-07-06 MED ORDER — ATORVASTATIN CALCIUM 20 MG PO TABS: 20.0000 mg | ORAL_TABLET | Freq: Every day | ORAL | 1 refills | Status: DC

## 2017-07-06 MED ORDER — CLONIDINE HCL 0.1 MG PO TABS: 0.1000 mg | ORAL_TABLET | Freq: Two times a day (BID) | ORAL | 1 refills | Status: DC

## 2017-07-06 NOTE — Telephone Encounter (Signed)
[name], daughter called and requested refills be sent to [facility]. Faxed.   Daughter also wanted to make sure paperwork was completed and faxed to [facility]. Confirmed

## 2017-07-07 DIAGNOSIS — L89312 Pressure ulcer of right buttock, stage 2: Secondary | ICD-10-CM | POA: Diagnosis not present

## 2017-07-07 DIAGNOSIS — E1142 Type 2 diabetes mellitus with diabetic polyneuropathy: Secondary | ICD-10-CM | POA: Diagnosis not present

## 2017-07-07 DIAGNOSIS — R5381 Other malaise: Secondary | ICD-10-CM | POA: Diagnosis not present

## 2017-07-07 DIAGNOSIS — I10 Essential (primary) hypertension: Secondary | ICD-10-CM | POA: Diagnosis not present

## 2017-07-07 DIAGNOSIS — F0390 Unspecified dementia without behavioral disturbance: Secondary | ICD-10-CM | POA: Diagnosis not present

## 2017-07-07 DIAGNOSIS — L89152 Pressure ulcer of sacral region, stage 2: Secondary | ICD-10-CM | POA: Diagnosis not present

## 2017-07-10 DIAGNOSIS — E1142 Type 2 diabetes mellitus with diabetic polyneuropathy: Secondary | ICD-10-CM | POA: Diagnosis not present

## 2017-07-10 DIAGNOSIS — F0390 Unspecified dementia without behavioral disturbance: Secondary | ICD-10-CM | POA: Diagnosis not present

## 2017-07-10 DIAGNOSIS — R5381 Other malaise: Secondary | ICD-10-CM | POA: Diagnosis not present

## 2017-07-10 DIAGNOSIS — L89152 Pressure ulcer of sacral region, stage 2: Secondary | ICD-10-CM | POA: Diagnosis not present

## 2017-07-10 DIAGNOSIS — L89312 Pressure ulcer of right buttock, stage 2: Secondary | ICD-10-CM | POA: Diagnosis not present

## 2017-07-10 DIAGNOSIS — I10 Essential (primary) hypertension: Secondary | ICD-10-CM | POA: Diagnosis not present

## 2017-07-11 ENCOUNTER — Other Ambulatory Visit: Payer: Self-pay

## 2017-07-11 ENCOUNTER — Other Ambulatory Visit: Payer: Medicare Other

## 2017-07-11 ENCOUNTER — Ambulatory Visit: Payer: Medicare Other | Admitting: Internal Medicine

## 2017-07-11 DIAGNOSIS — E782 Mixed hyperlipidemia: Secondary | ICD-10-CM

## 2017-07-11 DIAGNOSIS — E1142 Type 2 diabetes mellitus with diabetic polyneuropathy: Secondary | ICD-10-CM | POA: Diagnosis not present

## 2017-07-12 LAB — HEMOGLOBIN A1C
Hgb A1c MFr Bld: 9.2 % of total Hgb — ABNORMAL HIGH (ref <5.7)
MEAN PLASMA GLUCOSE: 217 (calc)
eAG (mmol/L): 12 (calc)

## 2017-07-12 LAB — MICROALBUMIN / CREATININE URINE RATIO
Creatinine, Urine: 111 mg/dL (ref 20–275)
MICROALB/CREAT RATIO: 104 ug/mg{creat} — AB (ref <30)
Microalb, Ur: 11.5 mg/dL

## 2017-07-12 LAB — LIPID PANEL
Cholesterol: 154 mg/dL (ref <200)
HDL: 44 mg/dL — ABNORMAL LOW (ref >50)
LDL Cholesterol (Calc): 91 mg/dL (calc)
NON-HDL CHOLESTEROL (CALC): 110 mg/dL (ref <130)
TRIGLYCERIDES: 92 mg/dL (ref <150)
Total CHOL/HDL Ratio: 3.5 (calc) (ref <5.0)

## 2017-07-13 DIAGNOSIS — L89152 Pressure ulcer of sacral region, stage 2: Secondary | ICD-10-CM | POA: Diagnosis not present

## 2017-07-13 DIAGNOSIS — R5381 Other malaise: Secondary | ICD-10-CM | POA: Diagnosis not present

## 2017-07-13 DIAGNOSIS — L89312 Pressure ulcer of right buttock, stage 2: Secondary | ICD-10-CM | POA: Diagnosis not present

## 2017-07-13 DIAGNOSIS — I10 Essential (primary) hypertension: Secondary | ICD-10-CM | POA: Diagnosis not present

## 2017-07-13 DIAGNOSIS — E1142 Type 2 diabetes mellitus with diabetic polyneuropathy: Secondary | ICD-10-CM | POA: Diagnosis not present

## 2017-07-13 DIAGNOSIS — F0390 Unspecified dementia without behavioral disturbance: Secondary | ICD-10-CM | POA: Diagnosis not present

## 2017-07-17 DIAGNOSIS — I10 Essential (primary) hypertension: Secondary | ICD-10-CM | POA: Diagnosis not present

## 2017-07-17 DIAGNOSIS — E1142 Type 2 diabetes mellitus with diabetic polyneuropathy: Secondary | ICD-10-CM | POA: Diagnosis not present

## 2017-07-17 DIAGNOSIS — R5381 Other malaise: Secondary | ICD-10-CM | POA: Diagnosis not present

## 2017-07-17 DIAGNOSIS — L89312 Pressure ulcer of right buttock, stage 2: Secondary | ICD-10-CM | POA: Diagnosis not present

## 2017-07-17 DIAGNOSIS — L89152 Pressure ulcer of sacral region, stage 2: Secondary | ICD-10-CM | POA: Diagnosis not present

## 2017-07-17 DIAGNOSIS — F0390 Unspecified dementia without behavioral disturbance: Secondary | ICD-10-CM | POA: Diagnosis not present

## 2017-07-18 ENCOUNTER — Encounter: Payer: Self-pay | Admitting: Internal Medicine

## 2017-07-18 ENCOUNTER — Ambulatory Visit (INDEPENDENT_AMBULATORY_CARE_PROVIDER_SITE_OTHER): Payer: Medicare Other | Admitting: Internal Medicine

## 2017-07-18 VITALS — BP 140/80 | HR 75 | Temp 97.7°F

## 2017-07-18 DIAGNOSIS — E1169 Type 2 diabetes mellitus with other specified complication: Secondary | ICD-10-CM

## 2017-07-18 DIAGNOSIS — F0390 Unspecified dementia without behavioral disturbance: Secondary | ICD-10-CM | POA: Diagnosis not present

## 2017-07-18 DIAGNOSIS — I890 Lymphedema, not elsewhere classified: Secondary | ICD-10-CM | POA: Diagnosis not present

## 2017-07-18 DIAGNOSIS — E1142 Type 2 diabetes mellitus with diabetic polyneuropathy: Secondary | ICD-10-CM

## 2017-07-18 DIAGNOSIS — I10 Essential (primary) hypertension: Secondary | ICD-10-CM

## 2017-07-18 DIAGNOSIS — E785 Hyperlipidemia, unspecified: Secondary | ICD-10-CM

## 2017-07-18 DIAGNOSIS — E2839 Other primary ovarian failure: Secondary | ICD-10-CM

## 2017-07-18 DIAGNOSIS — D509 Iron deficiency anemia, unspecified: Secondary | ICD-10-CM | POA: Diagnosis not present

## 2017-07-18 DIAGNOSIS — L98422 Non-pressure chronic ulcer of back with fat layer exposed: Secondary | ICD-10-CM | POA: Diagnosis not present

## 2017-07-18 DIAGNOSIS — D1724 Benign lipomatous neoplasm of skin and subcutaneous tissue of left leg: Secondary | ICD-10-CM

## 2017-07-18 NOTE — Patient Instructions (Addendum)
Continue current medications as ordered  Will call with lab results  May need insulin therapy if blood sugars do not improve   Continue home health PT/OT, nursing for wound care  Follow up with specialists as scheduled  Follow up in 3 mos for DM, HTN, hyperlipidemia. Fasting labs prior to appt

## 2017-07-18 NOTE — Progress Notes (Signed)
Patient ID: [name], female   DOB: 06-Jun-1935, 82 y.o.   MRN: [id number]   Location:  [facility] OFFICE  Provider: DR Arletha Grippe  Code Status:  Goals of Care:  Advanced Directives 04/20/2017  Does Patient Have a Medical Advance Directive? Yes  Type of Paramedic of [place];Living will  Does patient want to make changes to medical advance directive? -  Copy of [place] in Chart? -     Chief Complaint  Patient presents with  . Medical Management of Chronic Issues    3 month F/U; B/P concerns  . Medication Refill    No refills    HPI: Patient is a 82 y.o. female seen today for medical management of chronic diseases.  HH Rn noted pressure sore on buttock, stage 2 in Jan 2019. It is slowly improving. She is getting OT via [place]. She is a poor historian due to dementia. Hx obtained from chart. He BS have been 150-170s. Appetite is poor.  She completed PT through [facility]. Family member looking to bring private duty PT at least 2 times per week to continue/maintain current activity level. She needs a lift to assist from sit to stand and transfer as well as lift chair. She is currently 2 person assist especially with toileting. Appetite great and she sleeps well. No falls. She is a poor historian due to dementia. Hx obtained from chart.   Left knee pain - controlled on voltaren gel. She had left knee pain and rec'd steroid injection in 08/2016.   Dementia - unchanged. currently not on any medication. Family stopped exelon patch as she was not showing any signs of improvement in QOL. No behavior issues. Albumin 3.0  DM - uncontrolled. She takes metformin 2000 mg daily (family increased dose July 12, 2017). She never started [place] as it cannot be crushed. Takes gabapentin for neuropathy. A1c 9.2% (prev 8.6%).; urine microalbumin/Cr ratio 104; LDL 91  Lymphedema - chronic L>RLE. Stable. She does not take diuretic. Tried TED stockings in the past but could  not tolerate them.   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 takes xeralto - lifetime. No bloody stools/easy bruising  Hyperlipidemia - controlled on lipitor. LDL 91; HDL 44. No myalgias  HTN - stable on clonidine and losartan. She is taking meds as ordered  Hypokalemia - stable on Kdur. K 3.8  GERD - stable off medication  Hx CVA - stable on xeralto. LDL 90. She did have TIA in May but sx's resolved. She saw neurology who told her to f/u prn. No new focal deficits  Iron deficiency anemia - stable. followed by hematology. Hgb 11.6; iron 76; ferritin 104   Past Medical History:  Diagnosis Date  . Dementia   . Diabetes mellitus type II 03/21/2011  . DVT (deep venous thrombosis) ([place]) 03/21/2011  . Hyperlipidemia 03/21/2011  . Hypertension 03/21/2011  . Lymphedema of leg 03/21/2011  . Pulmonary embolism ([place]) 03/21/2011    Past Surgical History:  Procedure Laterality Date  . CHOLECYSTECTOMY  2015  . SKIN TAG REMOVAL  07/25/2016  . VAGINAL HYSTERECTOMY     unknown of date  . VASCULAR SURGERY       reports that  has never smoked. she has never used smokeless tobacco. She reports that she does not drink alcohol or use drugs. Social History   Socioeconomic History  . Marital status: Widowed    Spouse name: Not on file  . Number of children: Not on file  .  Years of education: Not on file  . Highest education level: Not on file  Social Needs  . Financial resource strain: Not on file  . Food insecurity - worry: Not on file  . Food insecurity - inability: Not on file  . Transportation needs - medical: Not on file  . Transportation needs - non-medical: Not on file  Occupational History  . Not on file  Tobacco Use  . Smoking status: Never Smoker  . Smokeless tobacco: Never Used  Substance and Sexual Activity  . Alcohol use: No  . Drug use: No  . Sexual activity: No  Other Topics Concern  . Not on file  Social History Narrative   Diet?       Do you drink/eat things with  caffeine? occasional diet soda      Marital status?   Widowed/divorced                                 What year were you married? [place] you live in a house, apartment, assisted living, condo, trailer, etc.? home      Is it one or more stories? More (mainly living on one floor)      How many persons live in your home?      Do you have any pets in your home? (please list) no      Current or past profession: teacher      Do you exercise?       Physical therapy                              Type & how often? 2-3 per week      Do you have a living will? yes      Do you have a DNR form?      ?                             If not, do you want to discuss one?      Do you have signed POA/HPOA for forms? Yes   11/29/16 Lives at home wih family, son, daughter in law, patrice and grand children.  Has 3 children.  She is widowed, retired.  Has BA- education level.      Family History  Problem Relation Age of Onset  . Heart attack Mother   . COPD Sister   . Stroke Sister   . Bipolar disorder Daughter     No Known Allergies  Outpatient Encounter Medications as of 07/18/2017  Medication Sig  . ACCU-CHEK SOFTCLIX LANCETS lancets 100 each by Other route daily. Use as instructed Dx: E11.9  . acetaminophen (TYLENOL) 500 MG tablet Take 500 mg by mouth every 6 (six) hours as needed (pain).   Marland Kitchen atorvastatin (LIPITOR) 20 MG tablet Take 1 tablet (20 mg total) by mouth daily at 6 PM.  . B Complex Vitamins (VITAMIN B COMPLEX PO) Take 1 tablet by mouth daily.   . cholecalciferol (VITAMIN D) 1000 units tablet Take 5,000 Units by mouth daily.   . cloNIDine (CATAPRES) 0.1 MG tablet Take 1 tablet (0.1 mg total) by mouth 2 (two) times daily. May give additional 1 tablet daily if SBP >160  . colchicine (COLCRYS) 0.6 MG tablet Take one tablet by mouth once daily for 3  days for gout flare  . diclofenac sodium (VOLTAREN) 1 % GEL Apply topically 3 (three) times daily.  Marland Kitchen gabapentin (NEURONTIN) 300 MG  capsule Take 1 capsule (300 mg total) by mouth 3 (three) times daily.  Marland Kitchen glucose blood (ACCU-CHEK AVIVA PLUS) test strip 1 each by Other route 2 (two) times daily. E11.9  . losartan (COZAAR) 25 MG tablet Take 1 tablet (25 mg total) by mouth daily.  . metFORMIN (GLUCOPHAGE) 1000 MG tablet Take 1 tablet (1,000 mg total) by mouth 2 (two) times daily with a meal.  . OVER THE COUNTER MEDICATION Take 1 capsule by mouth daily. "ultra flore balance"  . Rivaroxaban (XARELTO) 15 MG TABS tablet Take 1 tablet (15 mg total) by mouth daily with supper.  . sitaGLIPtin (JANUVIA) 50 MG tablet Take 1 tablet (50 mg total) by mouth daily. (Patient not taking: Reported on 07/18/2017)   No facility-administered encounter medications on file as of 07/18/2017.     Review of Systems:  Review of Systems  Unable to perform ROS: Dementia    Health Maintenance  Topic Date Due  . DEXA SCAN  08/08/2002  . INFLUENZA VACCINE  12/07/2016  . TETANUS/TDAP  02/05/2018 (Originally 08/07/1954)  . PNA vac Low Risk Adult (1 of 2 - PCV13) 02/05/2018 (Originally 08/08/2002)  . FOOT EXAM  04/11/2018 (Originally 08/08/1945)  . OPHTHALMOLOGY EXAM  04/18/2018 (Originally 08/08/1945)  . HEMOGLOBIN A1C  01/11/2018    Physical Exam: Vitals:   There is no height or weight on file to calculate BMI. Physical Exam  Constitutional: She appears well-developed.  Frail appearing in NAD; cachexia  HENT:  Mouth/Throat: No oropharyngeal exudate.  MM dry; poor dentition; no oral lesions; no oral thrush  Eyes: Pupils are equal, round, and reactive to light. No scleral icterus.  Neck: Neck supple. Carotid bruit is not present. No tracheal deviation present.  Cardiovascular: Normal rate, regular rhythm, normal heart sounds and intact distal pulses.  Occasional extrasystoles are present. Exam reveals no gallop and no friction rub.  No murmur heard. +1 pitting LLE edema; no RLE edema; no calf TTP b/l  Pulmonary/Chest: Effort normal and breath sounds  normal. No stridor. No respiratory distress. She has no wheezes. She has no rales.  Abdominal: Soft. Normal appearance and bowel sounds are normal. She exhibits no distension and no mass. There is no hepatomegaly. There is no tenderness. There is no rigidity, no rebound and no guarding. No hernia.  Musculoskeletal: She exhibits edema and deformity.  Lymphadenopathy:    She has no cervical adenopathy.  Neurological: She is alert.  Skin: Skin is warm and dry. No rash noted.     Psychiatric: She has a normal mood and affect. Her behavior is normal. Thought content normal.    Labs reviewed: Basic Metabolic Panel: Recent Labs    08/25/16 0403  09/26/16 0843  11/08/16 1156 01/10/17 1230 02/28/17 1059  NA 136   < > 140   < > 138 139 142  K 3.5   < > 4.5   < > 3.9 5.1 3.8  CL 102   < > 102   < > 107 102 102  CO2 26   < > 26   < > 27 21 30   GLUCOSE 214*   < > 138*   < > 220* 191* 226*  BUN 9   < > 11   < > 11 17 16   CREATININE 0.44   < > 0.53*   < > 0.5* 0.53* 0.6  CALCIUM 8.2*   < > 9.4   < > 9.1 9.1 9.0  TSH 1.986  --  3.37  --   --  2.39  --    < > = values in this interval not displayed.   Liver Function Tests: Recent Labs    11/08/16 1156 01/10/17 1230 02/28/17 1059  AST 18 10 19   ALT 9* 8 14  ALKPHOS 64 72 68  BILITOT 0.50 0.3 0.50  PROT 6.7 6.2 6.6  ALBUMIN 2.9* 3.5* 3.0*   No results for input(s): LIPASE, AMYLASE in the last 8760 hours. No results for input(s): AMMONIA in the last 8760 hours. CBC: Recent Labs    11/08/16 1156 12/06/16 1121 02/28/17 1059  WBC 4.8 5.0 3.9  NEUTROABS 2.6 2.7 2.0  HGB 10.9* 12.4 11.6  HCT 34.4* 38.7 36.0  MCV 89 89 91  PLT 206 180 190   Lipid Panel: Recent Labs    09/26/16 0843 07/11/17 0950  CHOL 155 154  HDL 53 44*  LDLCALC 90  --   TRIG 62 92  CHOLHDL 2.9 3.5   Lab Results  Component Value Date   HGBA1C 9.2 (H) 07/11/2017    Procedures since last visit: No results found.  Assessment/Plan   ICD-10-CM   1.  Type 2 diabetes mellitus with diabetic polyneuropathy, without long-term current use of insulin (HCC) E11.42 Hemoglobin A1c    BMP with eGFR([facility])  2. Skin ulcer of sacrum with fat layer exposed ([place]) L98.422   3. Essential hypertension I10   4. Iron deficiency anemia, unspecified iron deficiency anemia type D50.9   5. Lymphedema of left lower extremity I89.0   6. Dementia without behavioral disturbance, unspecified dementia type F03.90   7. Lipoma of left lower extremity D17.24    greater trochanteric  8. Estrogen deficiency E28.39 DG Bone Density  9. Hyperlipidemia associated with type 2 diabetes mellitus ([place]) E11.69 Lipid Panel   E78.5      Family declines insulin tx at this time  Continue current medications as ordered  Will call with lab results  May need insulin therapy if blood sugars do not improve   Continue home health PT/OT, nursing for wound care  Follow up with specialists as scheduled  Follow up in 3 mos for DM, HTN, hyperlipidemia. Fasting labs prior to appt  [place] [facility]. Perlie Gold  [facility] [address] [place], [place] [address] [phone] Cell (Monday-Friday 8 AM - 5 PM) [phone] After 5 PM and follow prompts

## 2017-07-19 LAB — BASIC METABOLIC PANEL WITH GFR
BUN: 17 mg/dL (ref 7–25)
CALCIUM: 9.4 mg/dL (ref 8.6–10.4)
CO2: 27 mmol/L (ref 20–32)
Chloride: 102 mmol/L (ref 98–110)
Creat: 0.6 mg/dL (ref 0.60–0.93)
GFR, Est African American: 100 mL/min/{1.73_m2} (ref >=60)
GFR, Est Non African American: 87 mL/min/{1.73_m2} (ref >=60)
Glucose, Bld: 183 mg/dL — ABNORMAL HIGH (ref 65–139)
POTASSIUM: 5.4 mmol/L — AB (ref 3.5–5.3)
Sodium: 139 mmol/L (ref 135–146)

## 2017-07-20 ENCOUNTER — Other Ambulatory Visit: Payer: Self-pay | Admitting: Internal Medicine

## 2017-07-20 ENCOUNTER — Telehealth: Payer: Self-pay

## 2017-07-20 ENCOUNTER — Other Ambulatory Visit: Payer: Self-pay

## 2017-07-20 DIAGNOSIS — F0390 Unspecified dementia without behavioral disturbance: Secondary | ICD-10-CM | POA: Diagnosis not present

## 2017-07-20 DIAGNOSIS — L89312 Pressure ulcer of right buttock, stage 2: Secondary | ICD-10-CM | POA: Diagnosis not present

## 2017-07-20 DIAGNOSIS — E875 Hyperkalemia: Secondary | ICD-10-CM

## 2017-07-20 DIAGNOSIS — R5381 Other malaise: Secondary | ICD-10-CM | POA: Diagnosis not present

## 2017-07-20 DIAGNOSIS — I10 Essential (primary) hypertension: Secondary | ICD-10-CM | POA: Diagnosis not present

## 2017-07-20 DIAGNOSIS — E1142 Type 2 diabetes mellitus with diabetic polyneuropathy: Secondary | ICD-10-CM | POA: Diagnosis not present

## 2017-07-20 DIAGNOSIS — L89152 Pressure ulcer of sacral region, stage 2: Secondary | ICD-10-CM | POA: Diagnosis not present

## 2017-07-20 MED ORDER — SODIUM POLYSTYRENE SULFONATE 15 GM/60ML PO SUSP: ORAL | 0 refills | Status: DC

## 2017-07-20 NOTE — Telephone Encounter (Signed)
-----   Message from [place], [place] sent at 07/19/2017  9:18 PM EDT ----- Potassium elevated - Rx kayexelate 60 grams po x 1 dose; no RF; repeat BMP on 07/24/17 for hyperkalemia

## 2017-07-22 ENCOUNTER — Encounter: Payer: Self-pay | Admitting: Internal Medicine

## 2017-07-22 DIAGNOSIS — E785 Hyperlipidemia, unspecified: Secondary | ICD-10-CM

## 2017-07-22 DIAGNOSIS — F0390 Unspecified dementia without behavioral disturbance: Secondary | ICD-10-CM | POA: Diagnosis not present

## 2017-07-22 DIAGNOSIS — D1724 Benign lipomatous neoplasm of skin and subcutaneous tissue of left leg: Secondary | ICD-10-CM | POA: Insufficient documentation

## 2017-07-22 DIAGNOSIS — E2839 Other primary ovarian failure: Secondary | ICD-10-CM | POA: Insufficient documentation

## 2017-07-22 DIAGNOSIS — L98422 Non-pressure chronic ulcer of back with fat layer exposed: Secondary | ICD-10-CM | POA: Insufficient documentation

## 2017-07-22 DIAGNOSIS — I10 Essential (primary) hypertension: Secondary | ICD-10-CM | POA: Diagnosis not present

## 2017-07-22 DIAGNOSIS — L89152 Pressure ulcer of sacral region, stage 2: Secondary | ICD-10-CM | POA: Diagnosis not present

## 2017-07-22 DIAGNOSIS — E1142 Type 2 diabetes mellitus with diabetic polyneuropathy: Secondary | ICD-10-CM | POA: Diagnosis not present

## 2017-07-22 DIAGNOSIS — L89312 Pressure ulcer of right buttock, stage 2: Secondary | ICD-10-CM | POA: Diagnosis not present

## 2017-07-22 DIAGNOSIS — E1169 Type 2 diabetes mellitus with other specified complication: Secondary | ICD-10-CM | POA: Insufficient documentation

## 2017-07-22 DIAGNOSIS — R5381 Other malaise: Secondary | ICD-10-CM | POA: Diagnosis not present

## 2017-07-24 ENCOUNTER — Other Ambulatory Visit: Payer: Medicare Other

## 2017-07-24 DIAGNOSIS — E875 Hyperkalemia: Secondary | ICD-10-CM | POA: Diagnosis not present

## 2017-07-24 LAB — BASIC METABOLIC PANEL WITH GFR
BUN/Creatinine Ratio: 28 (calc) — ABNORMAL HIGH (ref 6–22)
BUN: 15 mg/dL (ref 7–25)
CALCIUM: 9.3 mg/dL (ref 8.6–10.4)
CO2: 30 mmol/L (ref 20–32)
Chloride: 102 mmol/L (ref 98–110)
Creat: 0.53 mg/dL — ABNORMAL LOW (ref 0.60–0.93)
GFR, EST AFRICAN AMERICAN: 105 mL/min/{1.73_m2} (ref >=60)
GFR, EST NON AFRICAN AMERICAN: 90 mL/min/{1.73_m2} (ref >=60)
Glucose, Bld: 252 mg/dL — ABNORMAL HIGH (ref 65–139)
Potassium: 3.5 mmol/L (ref 3.5–5.3)
Sodium: 142 mmol/L (ref 135–146)

## 2017-07-25 DIAGNOSIS — I10 Essential (primary) hypertension: Secondary | ICD-10-CM | POA: Diagnosis not present

## 2017-07-25 DIAGNOSIS — L89152 Pressure ulcer of sacral region, stage 2: Secondary | ICD-10-CM | POA: Diagnosis not present

## 2017-07-25 DIAGNOSIS — F0390 Unspecified dementia without behavioral disturbance: Secondary | ICD-10-CM | POA: Diagnosis not present

## 2017-07-25 DIAGNOSIS — R5381 Other malaise: Secondary | ICD-10-CM | POA: Diagnosis not present

## 2017-07-25 DIAGNOSIS — E1142 Type 2 diabetes mellitus with diabetic polyneuropathy: Secondary | ICD-10-CM | POA: Diagnosis not present

## 2017-07-25 DIAGNOSIS — L89312 Pressure ulcer of right buttock, stage 2: Secondary | ICD-10-CM | POA: Diagnosis not present

## 2017-07-26 DIAGNOSIS — Z86718 Personal history of other venous thrombosis and embolism: Secondary | ICD-10-CM | POA: Diagnosis not present

## 2017-07-26 DIAGNOSIS — I10 Essential (primary) hypertension: Secondary | ICD-10-CM | POA: Diagnosis not present

## 2017-07-26 DIAGNOSIS — Z8673 Personal history of transient ischemic attack (TIA), and cerebral infarction without residual deficits: Secondary | ICD-10-CM | POA: Diagnosis not present

## 2017-07-26 DIAGNOSIS — Z7984 Long term (current) use of oral hypoglycemic drugs: Secondary | ICD-10-CM | POA: Diagnosis not present

## 2017-07-26 DIAGNOSIS — E1142 Type 2 diabetes mellitus with diabetic polyneuropathy: Secondary | ICD-10-CM | POA: Diagnosis not present

## 2017-07-26 DIAGNOSIS — L89312 Pressure ulcer of right buttock, stage 2: Secondary | ICD-10-CM | POA: Diagnosis not present

## 2017-07-26 DIAGNOSIS — R2681 Unsteadiness on feet: Secondary | ICD-10-CM | POA: Diagnosis not present

## 2017-07-26 DIAGNOSIS — F0390 Unspecified dementia without behavioral disturbance: Secondary | ICD-10-CM | POA: Diagnosis not present

## 2017-08-01 ENCOUNTER — Ambulatory Visit: Payer: Medicare Other

## 2017-08-01 DIAGNOSIS — E1142 Type 2 diabetes mellitus with diabetic polyneuropathy: Secondary | ICD-10-CM | POA: Diagnosis not present

## 2017-08-01 DIAGNOSIS — R2681 Unsteadiness on feet: Secondary | ICD-10-CM | POA: Diagnosis not present

## 2017-08-01 DIAGNOSIS — F0390 Unspecified dementia without behavioral disturbance: Secondary | ICD-10-CM | POA: Diagnosis not present

## 2017-08-01 DIAGNOSIS — Z7984 Long term (current) use of oral hypoglycemic drugs: Secondary | ICD-10-CM | POA: Diagnosis not present

## 2017-08-01 DIAGNOSIS — L89312 Pressure ulcer of right buttock, stage 2: Secondary | ICD-10-CM | POA: Diagnosis not present

## 2017-08-01 DIAGNOSIS — I10 Essential (primary) hypertension: Secondary | ICD-10-CM | POA: Diagnosis not present

## 2017-08-04 ENCOUNTER — Other Ambulatory Visit: Payer: Medicare Other

## 2017-08-07 ENCOUNTER — Emergency Department (HOSPITAL_BASED_OUTPATIENT_CLINIC_OR_DEPARTMENT_OTHER)
Admission: EM | Admit: 2017-08-07 | Discharge: 2017-08-08 | Disposition: A | Discharge status: Home / Self Care | Payer: Medicare Other | Attending: Emergency Medicine | Admitting: Emergency Medicine

## 2017-08-07 ENCOUNTER — Other Ambulatory Visit: Payer: Self-pay

## 2017-08-07 ENCOUNTER — Emergency Department (HOSPITAL_BASED_OUTPATIENT_CLINIC_OR_DEPARTMENT_OTHER): Payer: Medicare Other

## 2017-08-07 ENCOUNTER — Encounter (HOSPITAL_BASED_OUTPATIENT_CLINIC_OR_DEPARTMENT_OTHER): Payer: Self-pay

## 2017-08-07 DIAGNOSIS — R1110 Vomiting, unspecified: Secondary | ICD-10-CM | POA: Diagnosis present

## 2017-08-07 DIAGNOSIS — R2681 Unsteadiness on feet: Secondary | ICD-10-CM | POA: Diagnosis not present

## 2017-08-07 DIAGNOSIS — F0390 Unspecified dementia without behavioral disturbance: Secondary | ICD-10-CM | POA: Diagnosis not present

## 2017-08-07 DIAGNOSIS — E119 Type 2 diabetes mellitus without complications: Secondary | ICD-10-CM | POA: Diagnosis not present

## 2017-08-07 DIAGNOSIS — R9431 Abnormal electrocardiogram [ECG] [EKG]: Secondary | ICD-10-CM | POA: Diagnosis not present

## 2017-08-07 DIAGNOSIS — L89312 Pressure ulcer of right buttock, stage 2: Secondary | ICD-10-CM | POA: Diagnosis not present

## 2017-08-07 DIAGNOSIS — R112 Nausea with vomiting, unspecified: Secondary | ICD-10-CM | POA: Diagnosis not present

## 2017-08-07 DIAGNOSIS — E1142 Type 2 diabetes mellitus with diabetic polyneuropathy: Secondary | ICD-10-CM | POA: Diagnosis not present

## 2017-08-07 DIAGNOSIS — Z8673 Personal history of transient ischemic attack (TIA), and cerebral infarction without residual deficits: Secondary | ICD-10-CM | POA: Diagnosis not present

## 2017-08-07 DIAGNOSIS — K5900 Constipation, unspecified: Secondary | ICD-10-CM | POA: Diagnosis not present

## 2017-08-07 DIAGNOSIS — I10 Essential (primary) hypertension: Secondary | ICD-10-CM | POA: Diagnosis not present

## 2017-08-07 DIAGNOSIS — R109 Unspecified abdominal pain: Secondary | ICD-10-CM | POA: Diagnosis not present

## 2017-08-07 DIAGNOSIS — Z7984 Long term (current) use of oral hypoglycemic drugs: Secondary | ICD-10-CM | POA: Diagnosis not present

## 2017-08-07 HISTORY — DX: Cerebral infarction, unspecified: I63.9

## 2017-08-07 LAB — CBC WITH DIFFERENTIAL/PLATELET
Basophils Absolute: 0 10*3/uL (ref 0.0–0.1)
Basophils Relative: 0 %
Eosinophils Absolute: 0 10*3/uL (ref 0.0–0.7)
Eosinophils Relative: 0 %
HEMATOCRIT: 39.1 % (ref 36.0–46.0)
HEMOGLOBIN: 12.4 g/dL (ref 12.0–15.0)
LYMPHS ABS: 0.8 10*3/uL (ref 0.7–4.0)
LYMPHS PCT: 12 %
MCH: 29 pg (ref 26.0–34.0)
MCHC: 31.7 g/dL (ref 30.0–36.0)
MCV: 91.4 fL (ref 78.0–100.0)
MONOS PCT: 9 %
Monocytes Absolute: 0.6 10*3/uL (ref 0.1–1.0)
NEUTROS ABS: 5.4 10*3/uL (ref 1.7–7.7)
NEUTROS PCT: 79 %
Platelets: 208 10*3/uL (ref 150–400)
RBC: 4.28 MIL/uL (ref 3.87–5.11)
RDW: 13.3 % (ref 11.5–15.5)
WBC: 6.9 10*3/uL (ref 4.0–10.5)

## 2017-08-07 LAB — COMPREHENSIVE METABOLIC PANEL
ALBUMIN: 3.9 g/dL (ref 3.5–5.0)
ALT: 13 U/L — ABNORMAL LOW (ref 14–54)
ANION GAP: 13 (ref 5–15)
AST: 18 U/L (ref 15–41)
Alkaline Phosphatase: 69 U/L (ref 38–126)
BUN: 21 mg/dL — ABNORMAL HIGH (ref 6–20)
CHLORIDE: 100 mmol/L — AB (ref 101–111)
CO2: 25 mmol/L (ref 22–32)
Calcium: 9.3 mg/dL (ref 8.9–10.3)
Creatinine, Ser: 0.4 mg/dL — ABNORMAL LOW (ref 0.44–1.00)
GFR calc non Af Amer: >60 mL/min (ref >60)
Glucose, Bld: 132 mg/dL — ABNORMAL HIGH (ref 65–99)
Potassium: 4.5 mmol/L (ref 3.5–5.1)
SODIUM: 138 mmol/L (ref 135–145)
Total Bilirubin: 0.5 mg/dL (ref 0.3–1.2)
Total Protein: 7.5 g/dL (ref 6.5–8.1)

## 2017-08-07 LAB — TROPONIN I: Troponin I: 0.03 ng/mL (ref <0.03)

## 2017-08-07 MED ORDER — POLYETHYLENE GLYCOL 3350 17 G PO PACK: 17.0000 g | PACK | Freq: Every day | ORAL | 0 refills | Status: DC

## 2017-08-07 MED ORDER — SODIUM CHLORIDE 0.9 % IV BOLUS
1000.0000 mL | Freq: Once | INTRAVENOUS | Status: AC
  Administered 2017-08-07: 1000 mL via INTRAVENOUS

## 2017-08-07 MED ORDER — ONDANSETRON HCL 4 MG/2ML IJ SOLN
4.0000 mg | Freq: Once | INTRAMUSCULAR | Status: AC
  Administered 2017-08-07: 4 mg via INTRAVENOUS
  Filled 2017-08-07: qty 2

## 2017-08-07 MED ORDER — SENNOSIDES-DOCUSATE SODIUM 8.6-50 MG PO TABS: 1.0000 | ORAL_TABLET | Freq: Every evening | ORAL | 0 refills | Status: AC | PRN

## 2017-08-07 MED ORDER — ONDANSETRON 4 MG PO TBDP: 4.0000 mg | ORAL_TABLET | Freq: Three times a day (TID) | ORAL | 0 refills | Status: DC | PRN

## 2017-08-07 NOTE — ED Notes (Signed)
In xray

## 2017-08-07 NOTE — ED Provider Notes (Signed)
Emergency Department Provider Note   I have reviewed the triage vital signs and the nursing notes.   HISTORY  Chief Complaint Emesis   HPI [name] is a 82 y.o. female with PMH of DM, HLD, HTN, and DVT/PE on Xarelto's to the emergency department for evaluation of vomiting x2 in the setting of abdominal discomfort and constipation.  Family denies any fevers or chills.  Patient denies any chest pain or dyspnea.  She typically goes approximately 1 week without bowel movements and occasionally will require a laxative.  She has not had a bowel movement last week and a half so family gave some laxative this morning with no improvement in constipation.  Patient was not having abdominal discomfort this morning but around lunchtime she had one episode of nonbloody emesis.  She proceeded to have a second episode of some dry heaving which resolved.  Denies any chest pain or dyspnea.  At the time of vomiting the patient had some abdominal discomfort but states that improved.  She denies any rectal pain or fullness.    Past Medical History:  Diagnosis Date  . Dementia   . Diabetes mellitus type II 03/21/2011  . DVT (deep venous thrombosis) ([place]) 03/21/2011  . Hyperlipidemia 03/21/2011  . Hypertension 03/21/2011  . Lymphedema of leg 03/21/2011  . Pulmonary embolism ([place]) 03/21/2011  . Stroke [facility])     Patient Active Problem List   Diagnosis Date Noted  . Skin ulcer of sacrum with fat layer exposed ([place]) 07/22/2017  . Lipoma of left lower extremity 07/22/2017  . Estrogen deficiency 07/22/2017  . Hyperlipidemia associated with type 2 diabetes mellitus ([place]) 07/22/2017  . History of TIA (transient ischemic attack) 01/10/2017  . Atrial septal aneurysm 11/22/2016  . Bilateral lower extremity edema 11/22/2016  . Iron deficiency anemia due to chronic blood loss 11/16/2016  . TIA (transient ischemic attack) 10/05/2016  . Lower urinary tract infectious disease   . Current use of long term  anticoagulation 08/24/2016  . Metabolic encephalopathy 16/02/9603  . Senile dementia without behavioral disturbance 08/23/2016  . Sepsis ([place]) 08/23/2016  . Acute CVA (cerebrovascular accident) ([place])   . Left-sided weakness 05/11/2016  . Dysarthria 05/11/2016  . Acute left-sided weakness 05/11/2016  . History of pulmonary embolism 03/21/2011  . History of DVT (deep vein thrombosis) 03/21/2011  . Type 2 diabetes mellitus with diabetic polyneuropathy, without long-term current use of insulin ([place]) 03/21/2011  . Lymphedema of leg 03/21/2011  . Hyperlipidemia 03/21/2011  . Hypertension 03/21/2011    Past Surgical History:  Procedure Laterality Date  . CHOLECYSTECTOMY  2015  . SKIN TAG REMOVAL  07/25/2016  . VAGINAL HYSTERECTOMY     unknown of date  . VASCULAR SURGERY      Current Outpatient Rx  . Order #: [id number] Class: Normal  . Order #: [id number] Class: Historical Med  . Order #: [id number] Class: Normal  . Order #: [id number] Class: Historical Med  . Order #: [id number] Class: Historical Med  . Order #: [id number] Class: Normal  . Order #: [id number] Class: Normal  . Order #: [id number] Class: Historical Med  . Order #: [id number] Class: Normal  . Order #: [id number] Class: Normal  . Order #: [id number] Class: Normal  . Order #: [id number] Class: Normal  . Order #: [id number] Class: Print  . Order #: [id number] Class: Historical Med  . Order #: [id number] Class: Print  . Order #: [id number] Class: Normal  . Order #: [id number] Class: Print  . Order #: [id number] Class: Normal  . Order #: [id number] Class: Normal    Allergies Patient has no  known allergies.  Family History  Problem Relation Age of Onset  . Heart attack Mother   . COPD Sister   . Stroke Sister   . Bipolar disorder Daughter     Social History Social History   Tobacco Use  . Smoking status: Never Smoker  . Smokeless tobacco: Never Used  Substance Use Topics  . Alcohol use: No  . Drug use: No    Review of  Systems  Constitutional: No fever/chills Eyes: No visual changes. ENT: No sore throat. Cardiovascular: Denies chest pain. Respiratory: Denies shortness of breath. Gastrointestinal: Positive abdominal pain (improved). Positive nausea and vomiting.  No diarrhea. Positive constipation. Genitourinary: Negative for dysuria. Musculoskeletal: Negative for back pain. Skin: Negative for rash. Neurological: Negative for headaches, focal weakness or numbness.  10-point ROS otherwise negative.  ____________________________________________   PHYSICAL EXAM:  VITAL SIGNS: ED Triage Vitals  Enc Vitals Group     BP 08/07/17 2015 (!) 167/92     Pulse Rate 08/07/17 2015 92     Resp 08/07/17 2015 18     Temp 08/07/17 2015 98.7 F (37.1 C)     Temp Source 08/07/17 2015 Oral     SpO2 08/07/17 2015 96 %     Weight 08/07/17 2016 130 lb (59 kg)     Height 08/07/17 2016 5\' 6"  (1.676 m)     Pain Score 08/07/17 2026 0   Constitutional: Alert/ Well appearing and in no acute distress. Eyes: Conjunctivae are normal.  Head: Atraumatic. Nose: No congestion/rhinnorhea. Mouth/Throat: Mucous membranes are slightly dry.  Neck: No stridor. Cardiovascular: Normal rate, regular rhythm. Good peripheral circulation. Grossly normal heart sounds.   Respiratory: Normal respiratory effort.  No retractions. Lungs CTAB. Gastrointestinal: Soft and nontender. No distention.  Musculoskeletal: No lower extremity tenderness nor edema. No gross deformities of extremities. Neurologic:  Normal speech and language. No gross focal neurologic deficits are appreciated.  Skin:  Skin is warm, dry and intact. No rash noted.  ____________________________________________   LABS (all labs ordered are listed, but only abnormal results are displayed)  Labs Reviewed  COMPREHENSIVE METABOLIC PANEL - Abnormal; Notable for the following components:      Result Value   Chloride 100 (*)    Glucose, Bld 132 (*)    BUN 21 (*)     Creatinine, Ser 0.40 (*)    ALT 13 (*)    All other components within normal limits  TROPONIN I - Abnormal; Notable for the following components:   Troponin I 0.03 (*)    All other components within normal limits  TROPONIN I - Abnormal; Notable for the following components:   Troponin I 0.03 (*)    All other components within normal limits  CBC WITH DIFFERENTIAL/PLATELET   ____________________________________________  EKG   EKG Interpretation  Date/Time:  Monday August 07 2017 20:53:16 EDT Ventricular Rate:  75 PR Interval:    QRS Duration: 69 QT Interval:  370 QTC Calculation: 414 R Axis:   64 Text Interpretation:  Sinus rhythm Consider left atrial enlargement Abnormal R-wave progression, early transition Baseline wander in lead(s) V2 V5 No STEMI. Similar to prior.  Confirmed by Nanda Quinton [phone]) on 08/07/2017 10:04:49 PM       ____________________________________________  RADIOLOGY  Dg Abdomen Acute W/chest  Result Date: 08/07/2017 CLINICAL DATA:  Abdominal pain and vomiting. EXAM: DG ABDOMEN ACUTE W/ 1V CHEST COMPARISON:  Chest x-ray dated November 08, 2016. FINDINGS: The heart remains borderline enlarged. Normal pulmonary vascularity. No focal consolidation, pleural  effusion, or pneumothorax. No acute osseous abnormality. Nonobstructive bowel gas pattern. Moderate colonic stool burden. No pneumoperitoneum. Degenerative changes of the lumbar spine. Prior cholecystectomy. IMPRESSION: 1. Prominent stool throughout the colon favors constipation. No obstruction. 2.  No active cardiopulmonary disease. Electronically Signed   By: Titus Dubin M.D.   On: 08/07/2017 22:13    ____________________________________________   PROCEDURES  Procedure(s) performed:   Procedures   ____________________________________________   INITIAL IMPRESSION / ASSESSMENT AND PLAN / ED COURSE  Pertinent labs & imaging results that were available during my care of the patient were reviewed by me  and considered in my medical decision making (see chart for details).  Patient presents to the emergency department for evaluation of nausea, vomiting, cramping abdominal pain.  She has slightly worse than normal constipation symptoms.  Abdomen is diffusely soft and nontender with no distention.  Denies any rectal pain.  Emesis was nonbloody.  No specific ACS symptoms but with nausea and report of diaphoresis at home plan for troponin and EKG.  EKG reviewed with no acute findings.  Reassess after plain film of the abdomen, labs, IV fluids, Zofran.   10:05 PM Reviewed labs which are largely unremarkable. Troponin is technically elevated at 0.03. Doubt ACS. Plan for second troponin at 3 hours. If negative, will discharge. Dr. Stark Jock to follow repeat troponin.    ____________________________________________  FINAL CLINICAL IMPRESSION(S) / ED DIAGNOSES  Final diagnoses:  Non-intractable vomiting with nausea, unspecified vomiting type  Constipation, unspecified constipation type     MEDICATIONS GIVEN DURING THIS VISIT:  Medications  sodium chloride 0.9 % bolus 1,000 mL (0 mLs Intravenous Stopped 08/07/17 2223)  ondansetron (ZOFRAN) injection 4 mg (4 mg Intravenous Given 08/07/17 2147)  ondansetron (ZOFRAN) tablet 4 mg (4 mg Oral Given 08/08/17 0047)     NEW OUTPATIENT MEDICATIONS STARTED DURING THIS VISIT:  Discharge Medication List as of 08/08/2017 12:33 AM    START taking these medications   Details  ondansetron (ZOFRAN ODT) 4 MG disintegrating tablet Take 1 tablet (4 mg total) by mouth every 8 (eight) hours as needed for nausea or vomiting., Starting Mon 08/07/2017, Print    polyethylene glycol (MIRALAX) packet Take 17 g by mouth daily., Starting Mon 08/07/2017, Print    senna-docusate (SENOKOT-S) 8.6-50 MG tablet Take 1 tablet by mouth at bedtime as needed for up to 7 days for mild constipation or moderate constipation., Starting Mon 08/07/2017, Until Mon 08/14/2017, Print        Note:  This  document was prepared using Dragon voice recognition software and may include unintentional dictation errors.  Nanda Quinton, MD Emergency Medicine    Long, [facility], MD 08/08/17 [phone]

## 2017-08-07 NOTE — ED Triage Notes (Signed)
Pt has not felt well all day, increased fatigue and decreased appetite, then started vomiting around 1800, pt has not had a bowel movement in about a week per family, was given a laxative this morning, abdomen is soft and nontender

## 2017-08-07 NOTE — Discharge Instructions (Signed)
You were seen in the emergency department today for constipation and vomiting.  We recommend that you use one or more of the following over-the-counter medications in the order described:   1)  Colace (or Dulcolax) 100 mg:  This is a stool softener, and you may take it once or twice a day as needed. 2)  Senna tablets:  This is a bowel stimulant that will help "push" out your stool. It is the next step to add after you have tried a stool softener. 3)  Miralax (powder):  This medication works by drawing additional fluid into your intestines and helps to flush out your stool.  Mix the powder with water or juice according to label instructions.  It may help if the Colace and Senna are not sufficient, but you must be sure to use the recommended amount of water or juice when you mix up the powder. Remember that narcotic pain medications are constipating, so avoid them or minimize their use.  Drink plenty of fluids.  Please return to the Emergency Department immediately if you develop new or worsening symptoms that concern you, such as (but not limited to) fever > 101 degrees, severe abdominal pain, or persistent vomiting.   Constipation Constipation is when a person has fewer than three bowel movements a week, has difficulty having a bowel movement, or has stools that are dry, hard, or larger than normal. As people grow older, constipation is more common. If you try to fix constipation with medicines that make you have a bowel movement (laxatives), the problem may get worse. Talmadge Ganas-term laxative use may cause the muscles of the colon to become weak. A low-fiber diet, not taking in enough fluids, and taking certain medicines may make constipation worse.  CAUSES  Certain medicines, such as antidepressants, pain medicine, iron supplements, antacids, and water pills.   Certain diseases, such as diabetes, irritable bowel syndrome (IBS), thyroid disease, or depression.   Not drinking enough water.   Not eating  enough fiber-rich foods.   Stress or travel.   Lack of physical activity or exercise.   Ignoring the urge to have a bowel movement.   Using laxatives too much.   SIGNS AND SYMPTOMS  Having fewer than three bowel movements a week.   Straining to have a bowel movement.   Having stools that are hard, dry, or larger than normal.   Feeling full or bloated.   Pain in the lower abdomen.   Not feeling relief after having a bowel movement.   DIAGNOSIS  Your health care provider will take a medical history and perform a physical exam. Further testing may be done for severe constipation. Some tests may include: A barium enema X-ray to examine your rectum, colon, and, sometimes, your small intestine.   A sigmoidoscopy to examine your lower colon.   A colonoscopy to examine your entire colon. TREATMENT  Treatment will depend on the severity of your constipation and what is causing it. Some dietary treatments include drinking more fluids and eating more fiber-rich foods. Lifestyle treatments may include regular exercise. If these diet and lifestyle recommendations do not help, your health care provider may recommend taking over-the-counter laxative medicines to help you have bowel movements. Prescription medicines may be prescribed if over-the-counter medicines do not work.  HOME CARE INSTRUCTIONS  Eat foods that have a lot of fiber, such as fruits, vegetables, whole grains, and beans. Limit foods high in fat and processed sugars, such as french fries, hamburgers, cookies, candies, and soda.  A fiber supplement may be added to your diet if you cannot get enough fiber from foods.   Drink enough fluids to keep your urine clear or pale yellow.   Exercise regularly or as directed by your health care provider.   Go to the restroom when you have the urge to go. Do not hold it.   Only take over-the-counter or prescription medicines as directed by your health care provider. Do not take other medicines for  constipation without talking to your health care provider first.   [place] IF:  You have bright red blood in your stool.   Your constipation lasts for more than 4 days or gets worse.   You have abdominal or rectal pain.   You have thin, pencil-like stools.   You have unexplained weight loss. MAKE SURE YOU:  Understand these instructions. Will watch your condition. Will get help right away if you are not doing well or get worse. Document Released: 01/22/2004 Document Revised: 04/30/2013 Document Reviewed: 02/04/2013 [employer] Patient Information 2015 [place], [place]. This information is not intended to replace advice given to you by your health care provider. Make sure you discuss any questions you have with your health care provider.

## 2017-08-08 DIAGNOSIS — R112 Nausea with vomiting, unspecified: Secondary | ICD-10-CM | POA: Diagnosis not present

## 2017-08-08 LAB — TROPONIN I: TROPONIN I: 0.03 ng/mL — AB (ref <0.03)

## 2017-08-08 MED ORDER — ONDANSETRON HCL 8 MG PO TABS
4.0000 mg | ORAL_TABLET | Freq: Once | ORAL | Status: AC
  Administered 2017-08-08: 4 mg via ORAL

## 2017-08-08 MED ORDER — ONDANSETRON 4 MG PO TBDP
ORAL_TABLET | ORAL | Status: AC
  Filled 2017-08-08: qty 1

## 2017-08-08 NOTE — ED Provider Notes (Signed)
Care assumed from Dr. Laverta Baltimore at shift change.  Patient awaiting a second troponin which has returned and is negative.  She is stable appearing and has no further complaints.  Results of these tests discussed with the patient who is comfortable with the disposition of discharge.  To return as needed for any problems.   Veryl Speak, MD 08/08/17 [phone]

## 2017-08-08 NOTE — ED Notes (Signed)
Expelled large amt of liquid stool while trying to get into w/c. Cleaned and placed on [facility]

## 2017-08-10 ENCOUNTER — Ambulatory Visit: Payer: Medicare Other | Admitting: Hematology & Oncology

## 2017-08-10 ENCOUNTER — Other Ambulatory Visit: Payer: Medicare Other

## 2017-08-15 ENCOUNTER — Inpatient Hospital Stay (HOSPITAL_BASED_OUTPATIENT_CLINIC_OR_DEPARTMENT_OTHER): Payer: Medicare Other | Admitting: Family

## 2017-08-15 ENCOUNTER — Other Ambulatory Visit: Payer: Self-pay

## 2017-08-15 ENCOUNTER — Inpatient Hospital Stay: Payer: Medicare Other | Attending: Hematology & Oncology

## 2017-08-15 VITALS — BP 163/83 | HR 88 | Temp 98.5°F | Resp 18

## 2017-08-15 DIAGNOSIS — Z86711 Personal history of pulmonary embolism: Secondary | ICD-10-CM | POA: Diagnosis not present

## 2017-08-15 DIAGNOSIS — Z8673 Personal history of transient ischemic attack (TIA), and cerebral infarction without residual deficits: Secondary | ICD-10-CM | POA: Insufficient documentation

## 2017-08-15 DIAGNOSIS — D500 Iron deficiency anemia secondary to blood loss (chronic): Secondary | ICD-10-CM

## 2017-08-15 DIAGNOSIS — Z7901 Long term (current) use of anticoagulants: Secondary | ICD-10-CM | POA: Insufficient documentation

## 2017-08-15 DIAGNOSIS — Z86718 Personal history of other venous thrombosis and embolism: Secondary | ICD-10-CM | POA: Insufficient documentation

## 2017-08-15 DIAGNOSIS — D509 Iron deficiency anemia, unspecified: Secondary | ICD-10-CM | POA: Diagnosis not present

## 2017-08-15 LAB — CMP (CANCER CENTER ONLY)
ALBUMIN: 3.6 g/dL (ref 3.5–5.0)
ALK PHOS: 64 U/L (ref 26–84)
ALT: 23 U/L (ref 10–47)
AST: 21 U/L (ref 11–38)
Anion gap: 11 (ref 5–15)
BILIRUBIN TOTAL: 0.5 mg/dL (ref 0.2–1.6)
BUN: 15 mg/dL (ref 7–22)
CHLORIDE: 103 mmol/L (ref 98–108)
CO2: 32 mmol/L (ref 18–33)
CREATININE: 0.9 mg/dL (ref 0.60–1.20)
Calcium: 9.4 mg/dL (ref 8.0–10.3)
Glucose, Bld: 166 mg/dL — ABNORMAL HIGH (ref 73–118)
POTASSIUM: 4.3 mmol/L (ref 3.3–4.7)
Sodium: 146 mmol/L — ABNORMAL HIGH (ref 128–145)
Total Protein: 7.1 g/dL (ref 6.4–8.1)

## 2017-08-15 LAB — CBC WITH DIFFERENTIAL (CANCER CENTER ONLY)
Basophils Absolute: 0 10*3/uL (ref 0.0–0.1)
Basophils Relative: 0 %
Eosinophils Absolute: 0 10*3/uL (ref 0.0–0.5)
Eosinophils Relative: 0 %
HEMATOCRIT: 39.5 % (ref 34.8–46.6)
HEMOGLOBIN: 12.6 g/dL (ref 11.6–15.9)
LYMPHS ABS: 1.9 10*3/uL (ref 0.9–3.3)
LYMPHS PCT: 39 %
MCH: 29.2 pg (ref 26.0–34.0)
MCHC: 31.9 g/dL — AB (ref 32.0–36.0)
MCV: 91.6 fL (ref 81.0–101.0)
MONOS PCT: 8 %
Monocytes Absolute: 0.4 10*3/uL (ref 0.1–0.9)
NEUTROS ABS: 2.5 10*3/uL (ref 1.5–6.5)
NEUTROS PCT: 53 %
Platelet Count: 207 10*3/uL (ref 145–400)
RBC: 4.31 MIL/uL (ref 3.70–5.32)
RDW: 13.1 % (ref 11.1–15.7)
WBC Count: 4.9 10*3/uL (ref 3.9–10.0)

## 2017-08-15 NOTE — Progress Notes (Signed)
Hematology and Oncology Follow Up Visit  [name] [id number] 1935/05/14 82 y.o. 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 She is doing well and has no new complaints at this time. She has a pressure sore on her bottom and is followed by wound care. She has a special cushion to sit on and is drinking protein shakes twice a day to help with healing. She states that her "butt still hurts." She has had no issue with infection. No fever, chills, n/v, cough, rash, dizziness, SOB, chest pain, palpitations, abdominal pain or changes in bowel or bladder habits.  No episodes of bleeding on Xarelto. Her caregiver verbalized that the patient is taking as prescribed. She has chronic puffiness in her feet and ankles from sitting in her wheel chair. This is unchanged. No c/o tenderness, numbness or tingling in her extremities.  She states that she is able to get up and walk a good bit at home. No recent falls, no syncopal episodes.  No lymphadenopathy noted on exam.  She states that she has a good appetite but needs to hydrate a little better at home.   ECOG Performance Status: 2 - Symptomatic, <50% confined to bed  Medications:  Allergies as of 08/15/2017   No Known Allergies     Medication List        Accurate as of 08/15/17  1:31 PM. Always use your most recent med list.          ACCU-CHEK SOFTCLIX LANCETS lancets 100 each by Other route daily. Use as instructed Dx: E11.9   acetaminophen 500 MG tablet Commonly known as:  TYLENOL Take 500 mg by mouth every 6 (six) hours as needed (pain).   atorvastatin 20 MG tablet Commonly known as:  LIPITOR Take 1 tablet (20 mg total) by  mouth daily at 6 PM.   cholecalciferol 1000 units tablet Commonly known as:  VITAMIN D Take 5,000 Units by mouth daily.   cloNIDine 0.1 MG tablet Commonly known as:  CATAPRES Take 1 tablet (0.1 mg total) by mouth 2 (two) times daily. May give additional 1 tablet daily if SBP >160   colchicine 0.6 MG tablet Commonly known as:  COLCRYS Take one tablet by mouth once daily for 3 days for gout flare   diclofenac sodium 1 % Gel Commonly known as:  VOLTAREN Apply topically 3 (three) times daily.   gabapentin 300 MG capsule Commonly known as:  NEURONTIN Take 1 capsule (300 mg total) by mouth 3 (three) times daily.   glucose blood test strip Commonly known as:  ACCU-CHEK AVIVA PLUS 1 each by Other route 2 (two) times daily. E11.9   losartan 25 MG tablet Commonly known as:  COZAAR Take 1 tablet (25 mg total) by mouth daily.   metFORMIN 1000 MG tablet Commonly known as:  GLUCOPHAGE Take 1 tablet (1,000 mg total) by mouth 2 (two) times daily with a meal.   ondansetron 4 MG disintegrating tablet Commonly known as:  ZOFRAN ODT Take 1 tablet (4 mg total) by mouth every 8 (eight) hours as needed for nausea or vomiting.   OVER THE COUNTER MEDICATION Take 1 capsule by mouth daily. "ultra flore balance"   polyethylene glycol  packet Commonly known as:  MIRALAX Take 17 g by mouth daily.   Rivaroxaban 15 MG Tabs tablet Commonly known as:  XARELTO Take 1 tablet (15 mg total) by mouth daily with supper.   sitaGLIPtin 50 MG tablet Commonly known as:  JANUVIA Take 1 tablet (50 mg total) by mouth daily.   sodium polystyrene 15 GM/60ML suspension Commonly known as:  KAYEXALATE Take 120 ml po once for elevated potassium   VITAMIN B COMPLEX PO Take 1 tablet by mouth daily.       Allergies: No Known Allergies  Past Medical History, Surgical history, Social history, and Family History were reviewed and updated.  Review of Systems: All other 10 point review of systems is negative.     Physical Exam:  vitals were not taken for this visit.   Wt Readings from Last 3 Encounters:  08/07/17 130 lb (59 kg)  04/20/17 128 lb (58.1 kg)  11/29/16 128 lb (58.1 kg)    Ocular: Sclerae unicteric, pupils equal, round and reactive to light Ear-nose-throat: Oropharynx clear, dentition fair Lymphatic: No cervical, supraclavicular or axillary adenopathy Lungs no rales or rhonchi, good excursion bilaterally Heart regular rate and rhythm, no murmur appreciated Abd soft, nontender, positive bowel sounds, no liver or spleen tip palpated on exam, no fluid wave  MSK no focal spinal tenderness, no joint edema Neuro: non-focal, well-oriented, appropriate affect Breasts: Deferred   Lab Results  Component Value Date   WBC 4.9 08/15/2017   HGB 12.4 08/07/2017   HCT 39.5 08/15/2017   MCV 91.6 08/15/2017   PLT 207 08/15/2017   Lab Results  Component Value Date   FERRITIN 104 02/28/2017   IRON 76 02/28/2017   TIBC 205 (L) 02/28/2017   UIBC 129 02/28/2017   IRONPCTSAT 37 02/28/2017   Lab Results  Component Value Date   RBC 4.31 08/15/2017   No results found for: KPAFRELGTCHN, LAMBDASER, KAPLAMBRATIO No results found for: Kandis Cocking, IGMSERUM No results found for: Odetta Pink, SPEI   Chemistry      Component Value Date/Time   NA 138 08/07/2017 2047   NA 142 02/28/2017 1059   K 4.5 08/07/2017 2047   K 3.8 02/28/2017 1059   CL 100 (L) 08/07/2017 2047   CL 102 02/28/2017 1059   CO2 25 08/07/2017 2047   CO2 30 02/28/2017 1059   BUN 21 (H) 08/07/2017 2047   BUN 16 02/28/2017 1059   CREATININE 0.40 (L) 08/07/2017 2047   CREATININE 0.53 (L) 07/24/2017 1418   GLU 140 03/30/2016      Component Value Date/Time   CALCIUM 9.3 08/07/2017 2047   CALCIUM 9.0 02/28/2017 1059   ALKPHOS 69 08/07/2017 2047   ALKPHOS 68 02/28/2017 1059   AST 18 08/07/2017 2047   AST 19 02/28/2017 1059   ALT 13 (L) 08/07/2017 2047   ALT 14  02/28/2017 1059   BILITOT 0.5 08/07/2017 2047   BILITOT 0.50 02/28/2017 1059      Impression and Plan: [name] is a very pleasant 82 yo African American female with history of PE and lower extremity DVT. She continues to do well on Xarelto and so far there has been no evidence of recurrence.  We will see what her iron studies show and bring her back in for infusion if needed.  We will go ahead and plan to see her back in another 6 months for follow-up.  They will contact our office with any questions or concerns.  We can certainly see her sooner if need be.   Laverna Peace, NP 4/9/20191:31 PM

## 2017-08-16 LAB — IRON AND TIBC
Iron: 54 ug/dL (ref 41–142)
Saturation Ratios: 24 % (ref 21–57)
TIBC: 229 ug/dL — AB (ref 236–444)
UIBC: 175 ug/dL

## 2017-08-16 LAB — RETICULOCYTES
RBC.: 4.27 MIL/uL (ref 3.70–5.45)
Retic Count, Absolute: 59.8 10*3/uL (ref 33.7–90.7)
Retic Ct Pct: 1.4 % (ref 0.7–2.1)

## 2017-08-16 LAB — FERRITIN: Ferritin: 97 ng/mL (ref 9–269)

## 2017-08-21 ENCOUNTER — Encounter: Payer: Self-pay | Admitting: Nurse Practitioner

## 2017-08-21 ENCOUNTER — Ambulatory Visit (INDEPENDENT_AMBULATORY_CARE_PROVIDER_SITE_OTHER): Payer: Medicare Other | Admitting: Nurse Practitioner

## 2017-08-21 VITALS — BP 156/84 | HR 68 | Temp 98.9°F

## 2017-08-21 DIAGNOSIS — Z7984 Long term (current) use of oral hypoglycemic drugs: Secondary | ICD-10-CM | POA: Diagnosis not present

## 2017-08-21 DIAGNOSIS — R2681 Unsteadiness on feet: Secondary | ICD-10-CM | POA: Diagnosis not present

## 2017-08-21 DIAGNOSIS — F0390 Unspecified dementia without behavioral disturbance: Secondary | ICD-10-CM | POA: Diagnosis not present

## 2017-08-21 DIAGNOSIS — K5900 Constipation, unspecified: Secondary | ICD-10-CM

## 2017-08-21 DIAGNOSIS — L89312 Pressure ulcer of right buttock, stage 2: Secondary | ICD-10-CM | POA: Diagnosis not present

## 2017-08-21 DIAGNOSIS — K529 Noninfective gastroenteritis and colitis, unspecified: Secondary | ICD-10-CM | POA: Diagnosis not present

## 2017-08-21 DIAGNOSIS — E1142 Type 2 diabetes mellitus with diabetic polyneuropathy: Secondary | ICD-10-CM | POA: Diagnosis not present

## 2017-08-21 DIAGNOSIS — I10 Essential (primary) hypertension: Secondary | ICD-10-CM | POA: Diagnosis not present

## 2017-08-21 NOTE — Progress Notes (Signed)
Careteam: Patient Care Team: Gildardo Cranker, DO as PCP - General (Internal Medicine) Marin Olp Rudell Cobb, MD as Consulting Physician (Oncology)  Advanced Directive information    No Known Allergies  Chief Complaint  Patient presents with  . Follow-up    Pt is being seen for a ED follow up. Pt was seen at [facility] on 4/1 to 4/2 due to vomiting. Pt has not had any more vomiting episodes. Pt has had several normal BM since ED visit.      HPI: Patient is a 82 y.o. female seen in the office today for ED follow up.  with PMH of DM, HLD, HTN, and DVT/PE on Xarelto's to the emergency department for evaluation of vomiting x2 in the setting of abdominal discomfort and constipation.  Daughter reports several family members with a GI bug. Daughter states she had a total of 4 episodes of vomiting and was concerned over dehydration. Lab work was stable and she got fluids in ED.  Once they left the hospital she did not have anymore episodes of vomiting and no diarrhea noted.  Pt with hx of constipation, daughter attempts to do diet modifications but not consistent and sometimes these not effective. Uses miralax as needed if prune juice not effective.  Normal BM in the last few days.  Eating and drinking normally. No acute concerns. No abdominal pain.   Review of Systems:  Review of Systems  Unable to perform ROS: Dementia    Past Medical History:  Diagnosis Date  . Dementia   . Diabetes mellitus type II 03/21/2011  . DVT (deep venous thrombosis) ([place]) 03/21/2011  . Hyperlipidemia 03/21/2011  . Hypertension 03/21/2011  . Lymphedema of leg 03/21/2011  . Pulmonary embolism ([place]) 03/21/2011  . Stroke [facility])    Past Surgical History:  Procedure Laterality Date  . CHOLECYSTECTOMY  2015  . SKIN TAG REMOVAL  07/25/2016  . VAGINAL HYSTERECTOMY     unknown of date  . VASCULAR SURGERY     Social History:   reports that she has never smoked. She has never used smokeless tobacco.  She reports that she does not drink alcohol or use drugs.  Family History  Problem Relation Age of Onset  . Heart attack Mother   . COPD Sister   . Stroke Sister   . Bipolar disorder Daughter     Medications: Patient's Medications  New Prescriptions   No medications on file  Previous Medications   ACCU-CHEK SOFTCLIX LANCETS LANCETS    100 each by Other route daily. Use as instructed Dx: E11.9   ACETAMINOPHEN (TYLENOL) 500 MG TABLET    Take 500 mg by mouth every 6 (six) hours as needed (pain).    ATORVASTATIN (LIPITOR) 20 MG TABLET    Take 1 tablet (20 mg total) by mouth daily at 6 PM.   B COMPLEX VITAMINS (VITAMIN B COMPLEX PO)    Take 1 tablet by mouth daily.    CHOLECALCIFEROL (VITAMIN D) 1000 UNITS TABLET    Take 5,000 Units by mouth daily.    CLONIDINE (CATAPRES) 0.1 MG TABLET    Take 1 tablet (0.1 mg total) by mouth 2 (two) times daily. May give additional 1 tablet daily if SBP >160   DICLOFENAC SODIUM (VOLTAREN) 1 % GEL    Apply topically 3 (three) times daily.   GABAPENTIN (NEURONTIN) 300 MG CAPSULE    Take 1 capsule (300 mg total) by mouth 3 (three) times daily.   GLUCOSE BLOOD (ACCU-CHEK AVIVA  PLUS) TEST STRIP    1 each by Other route 2 (two) times daily. E11.9   LOSARTAN (COZAAR) 25 MG TABLET    Take 1 tablet (25 mg total) by mouth daily.   METFORMIN (GLUCOPHAGE) 1000 MG TABLET    Take 1 tablet (1,000 mg total) by mouth 2 (two) times daily with a meal.   OVER THE COUNTER MEDICATION    Take 1 capsule by mouth daily. "ultra flore balance"   POLYETHYLENE GLYCOL (MIRALAX) PACKET    Take 17 g by mouth daily.   RIVAROXABAN (XARELTO) 15 MG TABS TABLET    Take 1 tablet (15 mg total) by mouth daily with supper.  Modified Medications   No medications on file  Discontinued Medications   No medications on file     Physical Exam:  Vitals:   08/21/17 1342  BP: (!) 156/84  Pulse: 68  Temp: 98.9 F (37.2 C)  TempSrc: Oral  SpO2: 99%   There is no height or weight on file to  calculate BMI.  Physical Exam  Constitutional: She appears well-developed.  Frail female  Cardiovascular: Normal rate, regular rhythm and normal heart sounds.  Pulmonary/Chest: Effort normal and breath sounds normal.  Abdominal: Soft. Bowel sounds are normal. She exhibits no distension. There is no tenderness.  Neurological: She is alert.  Skin: Skin is warm and dry.    Labs reviewed: Basic Metabolic Panel: Recent Labs    08/25/16 0403  09/26/16 0843  01/10/17 1230  07/24/17 1418 08/07/17 2047 08/15/17 1307  NA 136   < > 140   < > 139   < > 142 138 146*  K 3.5   < > 4.5   < > 5.1   < > 3.5 4.5 4.3  CL 102   < > 102   < > 102   < > 102 100* 103  CO2 26   < > 26   < > 21   < > 30 25 32  GLUCOSE 214*   < > 138*   < > 191*   < > 252* 132* 166*  BUN 9   < > 11   < > 17   < > 15 21* 15  CREATININE 0.44   < > 0.53*   < > 0.53*   < > 0.53* 0.40* 0.90  CALCIUM 8.2*   < > 9.4   < > 9.1   < > 9.3 9.3 9.4  TSH 1.986  --  3.37  --  2.39  --   --   --   --    < > = values in this interval not displayed.   Liver Function Tests: Recent Labs    02/28/17 1059 08/07/17 2047 08/15/17 1307  AST 19 18 21   ALT 14 13* 23  ALKPHOS 68 69 64  BILITOT 0.50 0.5 0.5  PROT 6.6 7.5 7.1  ALBUMIN 3.0* 3.9 3.6   No results for input(s): LIPASE, AMYLASE in the last 8760 hours. No results for input(s): AMMONIA in the last 8760 hours. CBC: Recent Labs    12/06/16 1121 02/28/17 1059 08/07/17 2047 08/15/17 1307  WBC 5.0 3.9 6.9 4.9  NEUTROABS 2.7 2.0 5.4 2.5  HGB 12.4 11.6 12.4  --   HCT 38.7 36.0 39.1 39.5  MCV 89 91 91.4 91.6  PLT 180 190 208 207   Lipid Panel: Recent Labs    09/26/16 0843 07/11/17 0950  CHOL 155 154  HDL 53 44*  LDLCALC  90 91  TRIG 62 92  CHOLHDL 2.9 3.5   TSH: Recent Labs    08/25/16 0403 09/26/16 0843 01/10/17 1230  TSH 1.986 3.37 2.39   A1C: Lab Results  Component Value Date   HGBA1C 9.2 (H) 07/11/2017     Assessment/Plan 1. Constipation,  unspecified constipation type Continue lifestyle modifications and as needed miralax   2. Gastroenteritis Resolved.   Next appt: 10/16/2017 Carlos American. [place], [facility] [phone]

## 2017-08-24 ENCOUNTER — Ambulatory Visit
Admission: RE | Admit: 2017-08-24 | Discharge: 2017-08-24 | Disposition: A | Discharge status: Home / Self Care | Payer: Medicare Other | Source: Ambulatory Visit | Attending: Internal Medicine | Admitting: Internal Medicine

## 2017-08-24 DIAGNOSIS — Z780 Asymptomatic menopausal state: Secondary | ICD-10-CM | POA: Diagnosis not present

## 2017-08-24 DIAGNOSIS — E2839 Other primary ovarian failure: Secondary | ICD-10-CM

## 2017-08-24 DIAGNOSIS — M810 Age-related osteoporosis without current pathological fracture: Secondary | ICD-10-CM | POA: Diagnosis not present

## 2017-08-28 ENCOUNTER — Telehealth: Payer: Self-pay | Admitting: Licensed Clinical Social Worker

## 2017-08-28 DIAGNOSIS — L89312 Pressure ulcer of right buttock, stage 2: Secondary | ICD-10-CM | POA: Diagnosis not present

## 2017-08-28 DIAGNOSIS — E1142 Type 2 diabetes mellitus with diabetic polyneuropathy: Secondary | ICD-10-CM | POA: Diagnosis not present

## 2017-08-28 DIAGNOSIS — Z7984 Long term (current) use of oral hypoglycemic drugs: Secondary | ICD-10-CM | POA: Diagnosis not present

## 2017-08-28 DIAGNOSIS — I10 Essential (primary) hypertension: Secondary | ICD-10-CM | POA: Diagnosis not present

## 2017-08-28 DIAGNOSIS — F0390 Unspecified dementia without behavioral disturbance: Secondary | ICD-10-CM | POA: Diagnosis not present

## 2017-08-28 DIAGNOSIS — R2681 Unsteadiness on feet: Secondary | ICD-10-CM | POA: Diagnosis not present

## 2017-08-28 NOTE — Telephone Encounter (Signed)
Palliative Care SW left a voicemail for pt's daughter-in-law, Tityana Pagan, requesting a visit.

## 2017-09-05 ENCOUNTER — Telehealth: Payer: Self-pay | Admitting: Licensed Clinical Social Worker

## 2017-09-05 DIAGNOSIS — E1142 Type 2 diabetes mellitus with diabetic polyneuropathy: Secondary | ICD-10-CM | POA: Diagnosis not present

## 2017-09-05 DIAGNOSIS — I10 Essential (primary) hypertension: Secondary | ICD-10-CM | POA: Diagnosis not present

## 2017-09-05 DIAGNOSIS — L89312 Pressure ulcer of right buttock, stage 2: Secondary | ICD-10-CM | POA: Diagnosis not present

## 2017-09-05 DIAGNOSIS — F0390 Unspecified dementia without behavioral disturbance: Secondary | ICD-10-CM | POA: Diagnosis not present

## 2017-09-05 DIAGNOSIS — Z7984 Long term (current) use of oral hypoglycemic drugs: Secondary | ICD-10-CM | POA: Diagnosis not present

## 2017-09-05 DIAGNOSIS — R2681 Unsteadiness on feet: Secondary | ICD-10-CM | POA: Diagnosis not present

## 2017-09-05 NOTE — Telephone Encounter (Signed)
Palliative Care SW left a vm for patient's daughter-in-law, Loisann Roach, and requested a visit.

## 2017-09-12 DIAGNOSIS — R2681 Unsteadiness on feet: Secondary | ICD-10-CM | POA: Diagnosis not present

## 2017-09-12 DIAGNOSIS — E1142 Type 2 diabetes mellitus with diabetic polyneuropathy: Secondary | ICD-10-CM | POA: Diagnosis not present

## 2017-09-12 DIAGNOSIS — I10 Essential (primary) hypertension: Secondary | ICD-10-CM | POA: Diagnosis not present

## 2017-09-12 DIAGNOSIS — L89312 Pressure ulcer of right buttock, stage 2: Secondary | ICD-10-CM | POA: Diagnosis not present

## 2017-09-12 DIAGNOSIS — F0390 Unspecified dementia without behavioral disturbance: Secondary | ICD-10-CM | POA: Diagnosis not present

## 2017-09-12 DIAGNOSIS — Z7984 Long term (current) use of oral hypoglycemic drugs: Secondary | ICD-10-CM | POA: Diagnosis not present

## 2017-09-22 ENCOUNTER — Other Ambulatory Visit: Payer: Self-pay | Admitting: Licensed Clinical Social Worker

## 2017-09-22 ENCOUNTER — Other Ambulatory Visit: Payer: Self-pay | Admitting: *Deleted

## 2017-09-28 ENCOUNTER — Other Ambulatory Visit: Payer: Medicare Other | Admitting: *Deleted

## 2017-09-28 ENCOUNTER — Other Ambulatory Visit: Payer: Medicare Other | Admitting: Licensed Clinical Social Worker

## 2017-09-28 DIAGNOSIS — Z515 Encounter for palliative care: Secondary | ICD-10-CM

## 2017-09-28 NOTE — Progress Notes (Signed)
COMMUNITY PALLIATIVE CARE SW NOTE  PATIENT NAME: [name] DOB: [birth year] MRN: [id number]  PRIMARY CARE PROVIDER: Gildardo Cranker, DO  RESPONSIBLE PARTY:  Acct ID - Guarantor Home Phone Work Phone Relationship Acct Type  [id number] DARRA, [phone]  Self P/F     [address], [place], [place] [address]     PLAN OF CARE and INTERVENTIONS:             1. GOALS OF CARE/ ADVANCE CARE PLANNING:  Goal is for patient to remain at home.  Per daughter-in-law, [name], patient has a LW and HCPOA. 2. SOCIAL/EMOTIONAL/SPIRITUAL ASSESSMENT/ INTERVENTIONS:  Patient is an 82 y/o [place] American widowed female who resides with her son, Dannielle Huh, his wife, [name], her mother, and [name] and Kelvin's 65 y/o son.  She was born and raised in [place].  She married and had two sons and a daughter.  One son and daughter live in [place].  Patient's husband died while in the [employer] during the [place] War.  Patient remained in [place] until 2017 when her health began to decline.  She has LTC insurance which allows her private caregivers.  SW and Palliative Care RN, Daryl Eastern, met with patient and [name].  Patient was eating breakfast with a modified fork.  She was able to answer some questions, and even stated she had difficulty expressing herself.  She denied pain.  Patient is a member of [employer].  [name] attempts to take patient to church a few times per month.  Verified patient was a full code.  [name] stated she will review the MOST form with patient's son. 3. PATIENT/CAREGIVER EDUCATION/ COPING:  SW and RN provided education regarding the Palliative Care Team.  [name] stated she understood. 4. PERSONAL EMERGENCY PLAN:  Patient has caregivers 10 hours a day seven days per week.  Family lives in the home and provides additional care as needed. 5. COMMUNITY RESOURCES COORDINATION/ HEALTH CARE NAVIGATION:  None per family. 6. FINANCIAL/LEGAL CONCERNS/INTERVENTIONS:  None  per family.     SOCIAL HX:  Social History   Tobacco Use  . Smoking status: Never Smoker  . Smokeless tobacco: Never Used  Substance Use Topics  . Alcohol use: No    CODE STATUS:  Full Code  ADVANCED DIRECTIVES:  Patient's son, Dannielle Huh, is her HCPOA.  [name] said she feels patient has a LW. MOST FORM COMPLETE:  Provided MOST form to review. HOSPICE EDUCATION PROVIDED: Not during current visit.  PPS:  Patient's intake is normal.  She cannot stand or ambulate independently. Duration of visit and documentation:  100 minutes.      [name] Trequan Marsolek, LCSW

## 2017-09-29 ENCOUNTER — Encounter: Payer: Self-pay | Admitting: *Deleted

## 2017-09-29 NOTE — Progress Notes (Signed)
COMMUNITY PALLIATIVE CARE RN NOTE  PATIENT NAME: [name] DOB: [birth year] MRN: [id number]  PRIMARY CARE PROVIDER: Gildardo Cranker, DO  RESPONSIBLE PARTY:  Acct ID - Guarantor Home Phone Work Phone Relationship Acct Type  [id number] YENA, [phone]  Self P/F     [address], [place], [place] [address]   PLAN OF CARE and INTERVENTION:  1. ADVANCE CARE PLANNING/GOALS OF CARE: Remain at home, Avoid hospitalizations 2. PATIENT/CAREGIVER EDUCATION: Explained Palliative Care services, Reinforced Safety with Mobility/Transfers 3. DISEASE STATUS: Joint visit made with Palliative SW Jeani Hawking [name]. Daughter in Scientist, forensic present during visit along with hired caregiver. Upon arrival patient sitting up at dining table eating breakfast. Denies any pain at this time. Occasionally patient will experience pain in bilateral knees and uses Voltaren gel which is helpful. Patient also takes Neurontin for diabetic nerve pain in bilateral feet. On occasion Tylenol will be given if necessary. Has not required since last month. Patient is able to answer simple questions, however has word finding difficulties. Very pleasant and smiling. Patient is dependent for all ADLs, but is able to feed herself independently with large grip utensils. Transfers require 1-2 person assistance. Patient is given bed baths. Patient has old wounds noted to her coccyx covered with a protective dressing (Allevyn) and has an air mattress which helps. Patient is turned/repositioned often throughout the day. Uses bedside commode. Has intermittent incontinence of both bowel and bladder. Wears adult briefs. Intake is good, 3 meals/day with occasional snacking in between. Occasionally medications will need to be crushed or broken in half due to some mild dysphagia. Blood sugars have been good. Last CBG 105. Occasional constipation. Stools are always soft. No history of impactions. Abdomen soft and non-distended. BS x 4. [name] gives  patient warm prune juice, Activia or occasional Dulcolax. LBM 09/25/17. Patient was given a prescription for Miralax, however family never had script filled. Patient has [place], which provides a hired caregiver for 10 hours/day. Medications reviewed. Patient has a follow up appointment with Dr. Eulas Post on June 12.    HISTORY OF PRESENT ILLNESS: This is a 82 yo female who lives in the home of her son and daughter in law, with assistance of hired caregivers daily. Palliative Care continues to follow patient to assess overall condition and assist with any symptom management needs. Next visit scheduled in 1 month    CODE STATUS: FULL CODE ADVANCED DIRECTIVES: Y, Living Will and HCPOA (son Kelvin) MOST FORM: no (copy left in the home and explained. [name] to take to patient's next appointment with Dr. Eulas Post to review and obtain signature PPS: 30%   PHYSICAL EXAM:   VITALS: Today's Vitals   09/28/17 1116  BP: (!) 178/80  Pulse: 81  Resp: 16  SpO2: 99%  PainSc: 0-No pain    LUNGS: clear to auscultation  CARDIAC: Cor RRR EXTREMITIES: Significant 3+ edema in L foot/ankle and trace edema in R foot/ankle SKIN: Patient has 4 small healed Stage 2 wounds to coccyx area coverd with Allevyn pad, exposed skin intact  NEURO: Alert and oriented to person/place, dementia, word finding difficulties, non-ambulatory   (Duration of visit and documentation 90 minutes)    Daryl Eastern, RN, BSN

## 2017-10-16 ENCOUNTER — Other Ambulatory Visit: Payer: Medicare Other

## 2017-10-18 ENCOUNTER — Encounter: Payer: Self-pay | Admitting: Internal Medicine

## 2017-10-18 ENCOUNTER — Ambulatory Visit (INDEPENDENT_AMBULATORY_CARE_PROVIDER_SITE_OTHER): Payer: Medicare Other | Admitting: Internal Medicine

## 2017-10-18 ENCOUNTER — Ambulatory Visit (INDEPENDENT_AMBULATORY_CARE_PROVIDER_SITE_OTHER): Payer: Medicare Other

## 2017-10-18 VITALS — BP 128/84 | HR 70 | Temp 97.4°F | Ht 67.0 in

## 2017-10-18 VITALS — BP 128/84 | HR 70 | Temp 97.4°F

## 2017-10-18 DIAGNOSIS — D509 Iron deficiency anemia, unspecified: Secondary | ICD-10-CM | POA: Diagnosis not present

## 2017-10-18 DIAGNOSIS — E1169 Type 2 diabetes mellitus with other specified complication: Secondary | ICD-10-CM | POA: Diagnosis not present

## 2017-10-18 DIAGNOSIS — F0390 Unspecified dementia without behavioral disturbance: Secondary | ICD-10-CM | POA: Diagnosis not present

## 2017-10-18 DIAGNOSIS — L98421 Non-pressure chronic ulcer of back limited to breakdown of skin: Secondary | ICD-10-CM

## 2017-10-18 DIAGNOSIS — E1142 Type 2 diabetes mellitus with diabetic polyneuropathy: Secondary | ICD-10-CM

## 2017-10-18 DIAGNOSIS — I10 Essential (primary) hypertension: Secondary | ICD-10-CM

## 2017-10-18 DIAGNOSIS — I890 Lymphedema, not elsewhere classified: Secondary | ICD-10-CM

## 2017-10-18 DIAGNOSIS — Z0000 Encounter for general adult medical examination without abnormal findings: Secondary | ICD-10-CM

## 2017-10-18 DIAGNOSIS — E785 Hyperlipidemia, unspecified: Secondary | ICD-10-CM | POA: Diagnosis not present

## 2017-10-18 DIAGNOSIS — E782 Mixed hyperlipidemia: Secondary | ICD-10-CM | POA: Diagnosis not present

## 2017-10-18 MED ORDER — GABAPENTIN 300 MG PO CAPS: 300.0000 mg | ORAL_CAPSULE | Freq: Three times a day (TID) | ORAL | 1 refills | Status: DC

## 2017-10-18 MED ORDER — GLUCOSE BLOOD VI STRP: 1.0000 | ORAL_STRIP | Freq: Two times a day (BID) | 3 refills | Status: DC

## 2017-10-18 MED ORDER — ACCU-CHEK SOFTCLIX LANCETS MISC: 1.0000 | Freq: Every day | 3 refills | Status: DC

## 2017-10-18 MED ORDER — METFORMIN HCL 1000 MG PO TABS: 1000.0000 mg | ORAL_TABLET | Freq: Two times a day (BID) | ORAL | 1 refills | Status: DC

## 2017-10-18 NOTE — Progress Notes (Addendum)
Patient ID: [name], female   DOB: 21-Dec-1935, 82 y.o.   MRN: [id number]   Location:  [facility] OFFICE  Provider: DR Arletha Grippe  Code Status:  Goals of Care:  Advanced Directives 10/18/2017  Does Patient Have a Medical Advance Directive? Yes  Type of Paramedic of [place];Living will  Does patient want to make changes to medical advance directive? No - Patient declined  Copy of [place] in Chart? Yes     Chief Complaint  Patient presents with  . Follow-up    3 month follow up on Dementia, HTN, hyperlipideia and  labs. Unable to complete MMSE. AWV completed today.  Bed sore has not completely healed and reoccuring. Wants to discuss medication treatment on their own for it.    HPI: Patient is a 82 y.o. female seen today for medical management of chronic diseases. BS improved at home. No low blood sugar reactions. Occasionally her toes get irritated. Daughter some times reduces metformin to 1/2 tab if BS in 64s. She was seen in the ED in Apr 2019 for abdominal pain and dx with constipation.  No further issues with constipation with new bowel regimen. Wound on buttock healed but does "flare up" occasionally. Family applies calcium alginate and skin prep prn.  She completed PT/OT [facility]. Family member has not followed through with private duty PT to continue/maintain current activity level. She needs a lift to assist from sit to stand and transfer as well as lift chair. She is currently 2 person assist especially with toileting. Appetite great and she sleeps well. No falls. She is a poor historian due to dementia. Hx obtained from chart.   Stage 2 wound on buttock - has healed. Family applies wound dsg prn.   Left knee pain - stable on voltaren gel. She had left knee pain and rec'd steroid injection in 08/2016.   Dementia - unchanged. currently not on any medication. Family stopped exelon patch as she was not showing any signs of improvement  in QOL. No behavior issues. Albumin 3.6  DM - home BS improved to 70-80s fasting. She takes metformin 2000 mg daily (family increased dose July 12, 2017). She never started [place] as it cannot be crushed. Takes gabapentin for neuropathy. A1c 9.2% (prev 8.6%).; urine microalbumin/Cr ratio 104; LDL 91  Lymphedema - chronic and stable L>RLE. Stable. She does not take diuretic. Tried TED stockings in the past but could not tolerate them.   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 takes xeralto - lifetime. No bloody stools/easy bruising  Hyperlipidemia - stable on lipitor. LDL 91; HDL 44. No myalgias  HTN -  Controlled on clonidine and losartan. She is taking meds as ordered  Hypokalemia - stable on Kdur. K 4.3  GERD - stable off medication  Hx CVA - stable on xeralto. LDL 91. She did have TIA in May 2018 but sx's resolved. She saw neurology who told her to f/u prn. No new focal deficits  Iron deficiency anemia - stable. followed by hematology Dr Martha Clan. Hgb 12.6; iron 54; ferritin 97   Osteoporosis - DXA in Apr 2019 revealed T score -3.6 in left femur. She is taking calcium with D. Declines bisphosphonate tx at this time. She has diffiulty swallowing pills.  Past Medical History:  Diagnosis Date  . Dementia   . Diabetes mellitus type II 03/21/2011  . DVT (deep venous thrombosis) ([address]) 03/21/2011  . Hyperlipidemia 03/21/2011  . Hypertension 03/21/2011  . Lymphedema of leg  03/21/2011  . Pulmonary embolism ([place]) 03/21/2011  . Stroke [facility])     Past Surgical History:  Procedure Laterality Date  . CHOLECYSTECTOMY  2015  . SKIN TAG REMOVAL  07/25/2016  . VAGINAL HYSTERECTOMY     unknown of date  . VASCULAR SURGERY       reports that she has never smoked. She has never used smokeless tobacco. She reports that she does not drink alcohol or use drugs. Social History   Socioeconomic History  . Marital status: Widowed    Spouse name: Not on file  . Number of children: Not on file  . Years of  education: Not on file  . Highest education level: Not on file  Occupational History  . Not on file  Social Needs  . Financial resource strain: Not hard at all  . Food insecurity:    Worry: Never true    Inability: Never true  . Transportation needs:    Medical: No    Non-medical: No  Tobacco Use  . Smoking status: Never Smoker  . Smokeless tobacco: Never Used  Substance and Sexual Activity  . Alcohol use: No  . Drug use: No  . Sexual activity: Never  Lifestyle  . Physical activity:    Days per week: 0 days    Minutes per session: 0 min  . Stress: Not at all  Relationships  . Social connections:    Talks on phone: More than three times a week    Gets together: More than three times a week    Attends religious service: More than 4 times per year    Active member of club or organization: No    Attends meetings of clubs or organizations: Never    Relationship status: Widowed  . Intimate partner violence:    Fear of current or ex partner: No    Emotionally abused: No    Physically abused: No    Forced sexual activity: No  Other Topics Concern  . Not on file  Social History Narrative   Diet?       Do you drink/eat things with caffeine? occasional diet soda      Marital status?   Widowed/divorced                                 What year were you married? [place] you live in a house, apartment, assisted living, condo, trailer, etc.? home      Is it one or more stories? More (mainly living on one floor)      How many persons live in your home?      Do you have any pets in your home? (please list) no      Current or past profession: teacher      Do you exercise?       Physical therapy                              Type & how often? 2-3 per week      Do you have a living will? yes      Do you have a DNR form?      ?                             If not,  do you want to discuss one?      Do you have signed POA/HPOA for forms? Yes   11/29/16 Lives at home wih  family, son, daughter in law, patrice and grand children.  Has 3 children.  She is widowed, retired.  Has BA- education level.      Family History  Problem Relation Age of Onset  . Heart attack Mother   . COPD Sister   . Stroke Sister   . Bipolar disorder Daughter     No Known Allergies  Outpatient Encounter Medications as of 10/18/2017  Medication Sig  . ACCU-CHEK SOFTCLIX LANCETS lancets 100 each by Other route daily. Use as instructed Dx: E11.9  . acetaminophen (TYLENOL) 500 MG tablet Take 500 mg by mouth every 6 (six) hours as needed (pain).   Marland Kitchen atorvastatin (LIPITOR) 20 MG tablet Take 1 tablet (20 mg total) by mouth daily at 6 PM.  . B Complex Vitamins (VITAMIN B COMPLEX PO) Take 1 tablet by mouth daily.   . cholecalciferol (VITAMIN D) 1000 units tablet Take 5,000 Units by mouth daily.   . cloNIDine (CATAPRES) 0.1 MG tablet Take 1 tablet (0.1 mg total) by mouth 2 (two) times daily. May give additional 1 tablet daily if SBP >160  . diclofenac sodium (VOLTAREN) 1 % GEL Apply topically 3 (three) times daily.  Marland Kitchen gabapentin (NEURONTIN) 300 MG capsule Take 1 capsule (300 mg total) by mouth 3 (three) times daily.  Marland Kitchen glucose blood (ACCU-CHEK AVIVA PLUS) test strip 1 each by Other route 2 (two) times daily. E11.9  . losartan (COZAAR) 25 MG tablet Take 1 tablet (25 mg total) by mouth daily.  . metFORMIN (GLUCOPHAGE) 1000 MG tablet Take 1 tablet (1,000 mg total) by mouth 2 (two) times daily with a meal.  . OVER THE COUNTER MEDICATION Take 1 capsule by mouth daily. "ultra flore balance"  . polyethylene glycol (MIRALAX) packet Take 17 g by mouth daily. (Patient taking differently: Take 17 g by mouth daily as needed. )  . Rivaroxaban (XARELTO) 15 MG TABS tablet Take 1 tablet (15 mg total) by mouth daily with supper.   No facility-administered encounter medications on file as of 10/18/2017.     Review of Systems:  Review of Systems  Unable to perform ROS: Dementia    Health Maintenance    Topic Date Due  . TETANUS/TDAP  02/05/2018 (Originally 08/07/1954)  . PNA vac Low Risk Adult (1 of 2 - PCV13) 02/05/2018 (Originally 08/08/2002)  . INFLUENZA VACCINE  03/20/2018 (Originally 12/07/2017)  . FOOT EXAM  04/11/2018 (Originally 08/08/1945)  . OPHTHALMOLOGY EXAM  04/18/2018 (Originally 08/08/1945)  . HEMOGLOBIN A1C  01/11/2018  . DEXA SCAN  Completed    Physical Exam: Vitals:   10/18/17 1110  BP: 128/84  Pulse: 70  Temp: (!) 97.4 F (36.3 C)  Height: 5\' 7"  (1.702 m)   Body mass index is 20.36 kg/m. Physical Exam  Constitutional: She appears well-developed and well-nourished.  HENT:  Mouth/Throat: Oropharynx is clear and moist. No oropharyngeal exudate.  MMM; no oral thrush  Eyes: Pupils are equal, round, and reactive to light. No scleral icterus.  Neck: Neck supple. Carotid bruit is not present. No tracheal deviation present. No thyromegaly present.  Cardiovascular: Normal rate, regular rhythm and intact distal pulses. Exam reveals no gallop and no friction rub.  Murmur (1/6 SEM) heard. +2 pitting LE edema b/l. No calf TTP  Pulmonary/Chest: Effort normal and breath sounds normal. No stridor. No respiratory distress. She has no  wheezes. She has no rales.  Abdominal: Soft. Normal appearance and bowel sounds are normal. She exhibits no distension and no mass. There is no hepatomegaly. There is no tenderness. There is no rigidity, no rebound and no guarding. No hernia.  obese  Musculoskeletal: She exhibits edema (small and large joint).  Lymphadenopathy:    She has no cervical adenopathy.  Neurological: She is alert. She has normal reflexes.  Skin: Skin is warm and dry. No rash noted.  Psychiatric: She has a normal mood and affect. Her behavior is normal. Thought content normal.   Diabetic Foot Exam - Simple   Simple Foot Form Visual Inspection See comments:  Yes Sensation Testing See comments:  Yes Pulse Check Posterior Tibialis and Dorsalis pulse intact bilaterally:   Yes Comments Reduced monofilament L>R; +2 pedal edema b/l; bunions noted; no calluses/ulcerations      Labs reviewed: Basic Metabolic Panel: Recent Labs    01/10/17 1230  07/24/17 1418 08/07/17 2047 08/15/17 1307  NA 139   < > 142 138 146*  K 5.1   < > 3.5 4.5 4.3  CL 102   < > 102 100* 103  CO2 21   < > 30 25 32  GLUCOSE 191*   < > 252* 132* 166*  BUN 17   < > 15 21* 15  CREATININE 0.53*   < > 0.53* 0.40* 0.90  CALCIUM 9.1   < > 9.3 9.3 9.4  TSH 2.39  --   --   --   --    < > = values in this interval not displayed.   Liver Function Tests: Recent Labs    02/28/17 1059 08/07/17 2047 08/15/17 1307  AST 19 18 21   ALT 14 13* 23  ALKPHOS 68 69 64  BILITOT 0.50 0.5 0.5  PROT 6.6 7.5 7.1  ALBUMIN 3.0* 3.9 3.6   No results for input(s): LIPASE, AMYLASE in the last 8760 hours. No results for input(s): AMMONIA in the last 8760 hours. CBC: Recent Labs    02/28/17 1059 08/07/17 2047 08/15/17 1307  WBC 3.9 6.9 4.9  NEUTROABS 2.0 5.4 2.5  HGB 11.6 12.4 12.6  HCT 36.0 39.1 39.5  MCV 91 91.4 91.6  PLT 190 208 207   Lipid Panel: Recent Labs    07/11/17 0950  CHOL 154  HDL 44*  LDLCALC 91  TRIG 92  CHOLHDL 3.5   Lab Results  Component Value Date   HGBA1C 9.2 (H) 07/11/2017    Procedures since last visit: No results found.  Assessment/Plan   ICD-10-CM   1. Type 2 diabetes mellitus with diabetic polyneuropathy, without long-term current use of insulin (HCC) E11.42 gabapentin (NEURONTIN) 300 MG capsule    glucose blood (ACCU-CHEK AVIVA PLUS) test strip    metFORMIN (GLUCOPHAGE) 1000 MG tablet    ACCU-CHEK SOFTCLIX LANCETS lancets  2. Essential hypertension I10   3. Iron deficiency anemia, unspecified iron deficiency anemia type D50.9   4. Lymphedema of left lower extremity I89.0   5. Dementia without behavioral disturbance, unspecified dementia type F03.90   6. Hyperlipidemia associated with type 2 diabetes mellitus (HCC) E11.69    E78.5   7. Mixed  hyperlipidemia E78.2   8. Skin ulcer of sacrum, limited to breakdown of skin ([place]) L98.421     Continue current medications as ordered  Follow up with specialists as scheduled  Will call with lab results  [place] - education handout guven  Follow up in 3  mos for dementia, DM, anemia, osteoporosis, HTN   Gerome Kokesh S. Perlie Gold  [facility] [address] [place], [place] [address] [phone] Cell (Monday-Friday 8 AM - 5 PM) [phone] After 5 PM and follow prompts

## 2017-10-18 NOTE — Patient Instructions (Signed)
[name] , Thank you for taking time to come for your Medicare Wellness Visit. I appreciate your ongoing commitment to your health goals. Please review the following plan we discussed and let me know if I can assist you in the future.   Screening recommendations/referrals: Colonoscopy excluded, over age 80 Mammogram excluded, over age 90 Bone Density excluded Recommended yearly ophthalmology/optometry visit for glaucoma screening and checkup Recommended yearly dental visit for hygiene and checkup  Vaccinations: Influenza vaccine due, declined Pneumococcal vaccine 23 due, please check with insurance if this was ever given over age 21 Tdap vaccine due, please check with insurance when this was last given  Shingles vaccine due, declined    Advanced directives: Please bring [place] a copy of your living will  Conditions/risks identified: none  Next appointment: Tyson Dense, RN 10/24/2018 @ 11am   Preventive Care 65 Years and Older, Female Preventive care refers to lifestyle choices and visits with your health care provider that can promote health and wellness. What does preventive care include?  A yearly physical exam. This is also called an annual well check.  Dental exams once or twice a year.  Routine eye exams. Ask your health care provider how often you should have your eyes checked.  Personal lifestyle choices, including:  Daily care of your teeth and gums.  Regular physical activity.  Eating a healthy diet.  Avoiding tobacco and drug use.  Limiting alcohol use.  Practicing safe sex.  Taking low-dose aspirin every day.  Taking vitamin and mineral supplements as recommended by your health care provider. What happens during an annual well check? The services and screenings done by your health care provider during your annual well check will depend on your age, overall health, lifestyle risk factors, and family history of disease. Counseling  Your health care  provider may ask you questions about your:  Alcohol use.  Tobacco use.  Drug use.  Emotional well-being.  Home and relationship well-being.  Sexual activity.  Eating habits.  History of falls.  Memory and ability to understand (cognition).  Work and work Statistician.  Reproductive health. Screening  You may have the following tests or measurements:  Height, weight, and BMI.  Blood pressure.  Lipid and cholesterol levels. These may be checked every 5 years, or more frequently if you are over 55 years old.  Skin check.  Lung cancer screening. You may have this screening every year starting at age 28 if you have a 30-pack-year history of smoking and currently smoke or have quit within the past 15 years.  Fecal occult blood test (FOBT) of the stool. You may have this test every year starting at age 42.  Flexible sigmoidoscopy or colonoscopy. You may have a sigmoidoscopy every 5 years or a colonoscopy every 10 years starting at age 29.  Hepatitis C blood test.  Hepatitis B blood test.  Sexually transmitted disease (STD) testing.  Diabetes screening. This is done by checking your blood sugar (glucose) after you have not eaten for a while (fasting). You may have this done every 1-3 years.  Bone density scan. This is done to screen for osteoporosis. You may have this done starting at age 33.  Mammogram. This may be done every 1-2 years. Talk to your health care provider about how often you should have regular mammograms. Talk with your health care provider about your test results, treatment options, and if necessary, the need for more tests. Vaccines  Your health care provider may recommend certain vaccines, such  as:  Influenza vaccine. This is recommended every year.  Tetanus, diphtheria, and acellular pertussis (Tdap, Td) vaccine. You may need a Td booster every 10 years.  Zoster vaccine. You may need this after age 78.  Pneumococcal 13-valent conjugate (PCV13)  vaccine. One dose is recommended after age 42.  Pneumococcal polysaccharide (PPSV23) vaccine. One dose is recommended after age 37. Talk to your health care provider about which screenings and vaccines you need and how often you need them. This information is not intended to replace advice given to you by your health care provider. Make sure you discuss any questions you have with your health care provider. Document Released: 05/22/2015 Document Revised: 01/13/2016 Document Reviewed: 02/24/2015 Elsevier Interactive Patient Education  2017 [place] Prevention in the Home Falls can cause injuries. They can happen to people of all ages. There are many things you can do to make your home safe and to help prevent falls. What can I do on the outside of my home?  Regularly fix the edges of walkways and driveways and fix any cracks.  Remove anything that might make you trip as you walk through a door, such as a raised step or threshold.  Trim any bushes or trees on the path to your home.  Use bright outdoor lighting.  Clear any walking paths of anything that might make someone trip, such as rocks or tools.  Regularly check to see if handrails are loose or broken. Make sure that both sides of any steps have handrails.  Any raised decks and porches should have guardrails on the edges.  Have any leaves, snow, or ice cleared regularly.  Use sand or salt on walking paths during winter.  Clean up any spills in your garage right away. This includes oil or grease spills. What can I do in the bathroom?  Use night lights.  Install grab bars by the toilet and in the tub and shower. Do not use towel bars as grab bars.  Use non-skid mats or decals in the tub or shower.  If you need to sit down in the shower, use a plastic, non-slip stool.  Keep the floor dry. Clean up any water that spills on the floor as soon as it happens.  Remove soap buildup in the tub or shower  regularly.  Attach bath mats securely with double-sided non-slip rug tape.  Do not have throw rugs and other things on the floor that can make you trip. What can I do in the bedroom?  Use night lights.  Make sure that you have a light by your bed that is easy to reach.  Do not use any sheets or blankets that are too big for your bed. They should not hang down onto the floor.  Have a firm chair that has side arms. You can use this for support while you get dressed.  Do not have throw rugs and other things on the floor that can make you trip. What can I do in the kitchen?  Clean up any spills right away.  Avoid walking on wet floors.  Keep items that you use a lot in easy-to-reach places.  If you need to reach something above you, use a strong step stool that has a grab bar.  Keep electrical cords out of the way.  Do not use floor polish or wax that makes floors slippery. If you must use wax, use non-skid floor wax.  Do not have throw rugs and other things on the  floor that can make you trip. What can I do with my stairs?  Do not leave any items on the stairs.  Make sure that there are handrails on both sides of the stairs and use them. Fix handrails that are broken or loose. Make sure that handrails are as long as the stairways.  Check any carpeting to make sure that it is firmly attached to the stairs. Fix any carpet that is loose or worn.  Avoid having throw rugs at the top or bottom of the stairs. If you do have throw rugs, attach them to the floor with carpet tape.  Make sure that you have a light switch at the top of the stairs and the bottom of the stairs. If you do not have them, ask someone to add them for you. What else can I do to help prevent falls?  Wear shoes that:  Do not have high heels.  Have rubber bottoms.  Are comfortable and fit you well.  Are closed at the toe. Do not wear sandals.  If you use a stepladder:  Make sure that it is fully  opened. Do not climb a closed stepladder.  Make sure that both sides of the stepladder are locked into place.  Ask someone to hold it for you, if possible.  Clearly mark and make sure that you can see:  Any grab bars or handrails.  First and last steps.  Where the edge of each step is.  Use tools that help you move around (mobility aids) if they are needed. These include:  Canes.  Walkers.  Scooters.  Crutches.  Turn on the lights when you go into a dark area. Replace any light bulbs as soon as they burn out.  Set up your furniture so you have a clear path. Avoid moving your furniture around.  If any of your floors are uneven, fix them.  If there are any pets around you, be aware of where they are.  Review your medicines with your doctor. Some medicines can make you feel dizzy. This can increase your chance of falling. Ask your doctor what other things that you can do to help prevent falls. This information is not intended to replace advice given to you by your health care provider. Make sure you discuss any questions you have with your health care provider. Document Released: 02/19/2009 Document Revised: 10/01/2015 Document Reviewed: 05/30/2014 Elsevier Interactive Patient Education  2017 [employer].

## 2017-10-18 NOTE — Progress Notes (Addendum)
Subjective:   [name] is a 82 y.o. female who presents for Medicare Annual (Subsequent) preventive examination.  Last AWV-07/26/2016       Objective:     Vitals: BP 128/84 (BP Location: Left Arm, Patient Position: Sitting)   Pulse 70   Temp (!) 97.4 F (36.3 C) (Oral)   There is no height or weight on file to calculate BMI.  Advanced Directives 10/18/2017 08/15/2017 08/07/2017 04/20/2017 01/10/2017 12/06/2016 11/16/2016  Does Patient Have a Medical Advance Directive? Yes Yes Yes Yes No Yes Yes  Type of Paramedic of [place];Living will [place];Living will Healthcare Power of [place];Living will - [place];Living will -  Does patient want to make changes to medical advance directive? No - Patient declined - No - Patient declined - - - -  Copy of [place] in Chart? Yes Yes No - copy requested - - - -    Tobacco Social History   Tobacco Use  Smoking Status Never Smoker  Smokeless Tobacco Never Used     Counseling given: Not Answered   Clinical Intake:  Pre-visit preparation completed: No  Pain : No/denies pain     Nutritional Risks: None Diabetes: Yes CBG done?: No Did pt. bring in CBG monitor from home?: No  How often do you need to have someone help you when you read instructions, pamphlets, or other written materials from your doctor or pharmacy?: 1 - Never What is the last grade level you completed in school?: [employer]  Interpreter Needed?: No  Information entered by :: Tyson Dense, RN  Past Medical History:  Diagnosis Date  . Dementia   . Diabetes mellitus type II 03/21/2011  . DVT (deep venous thrombosis) ([place]) 03/21/2011  . Hyperlipidemia 03/21/2011  . Hypertension 03/21/2011  . Lymphedema of leg 03/21/2011  . Pulmonary embolism ([place]) 03/21/2011  . Stroke [facility])    Past Surgical History:  Procedure Laterality Date  . CHOLECYSTECTOMY   2015  . SKIN TAG REMOVAL  07/25/2016  . VAGINAL HYSTERECTOMY     unknown of date  . VASCULAR SURGERY     Family History  Problem Relation Age of Onset  . Heart attack Mother   . COPD Sister   . Stroke Sister   . Bipolar disorder Daughter    Social History   Socioeconomic History  . Marital status: Widowed    Spouse name: Not on file  . Number of children: Not on file  . Years of education: Not on file  . Highest education level: Not on file  Occupational History  . Not on file  Social Needs  . Financial resource strain: Not hard at all  . Food insecurity:    Worry: Never true    Inability: Never true  . Transportation needs:    Medical: No    Non-medical: No  Tobacco Use  . Smoking status: Never Smoker  . Smokeless tobacco: Never Used  Substance and Sexual Activity  . Alcohol use: No  . Drug use: No  . Sexual activity: Never  Lifestyle  . Physical activity:    Days per week: 0 days    Minutes per session: 0 min  . Stress: Not at all  Relationships  . Social connections:    Talks on phone: More than three times a week    Gets together: More than three times a week    Attends religious service: More than 4  times per year    Active member of club or organization: No    Attends meetings of clubs or organizations: Never    Relationship status: Widowed  Other Topics Concern  . Not on file  Social History Narrative   Diet?       Do you drink/eat things with caffeine? occasional diet soda      Marital status?   Widowed/divorced                                 What year were you married? [place] you live in a house, apartment, assisted living, condo, trailer, etc.? home      Is it one or more stories? More (mainly living on one floor)      How many persons live in your home?      Do you have any pets in your home? (please list) no      Current or past profession: teacher      Do you exercise?       Physical therapy                               Type & how often? 2-3 per week      Do you have a living will? yes      Do you have a DNR form?      ?                             If not, do you want to discuss one?      Do you have signed POA/HPOA for forms? Yes   11/29/16 Lives at home wih family, son, daughter in law, patrice and grand children.  Has 3 children.  She is widowed, retired.  Has BA- education level.      Outpatient Encounter Medications as of 10/18/2017  Medication Sig  . ACCU-CHEK SOFTCLIX LANCETS lancets 100 each by Other route daily. Use as instructed Dx: E11.9  . acetaminophen (TYLENOL) 500 MG tablet Take 500 mg by mouth every 6 (six) hours as needed (pain).   Marland Kitchen atorvastatin (LIPITOR) 20 MG tablet Take 1 tablet (20 mg total) by mouth daily at 6 PM.  . B Complex Vitamins (VITAMIN B COMPLEX PO) Take 1 tablet by mouth daily.   . cholecalciferol (VITAMIN D) 1000 units tablet Take 5,000 Units by mouth daily.   . cloNIDine (CATAPRES) 0.1 MG tablet Take 1 tablet (0.1 mg total) by mouth 2 (two) times daily. May give additional 1 tablet daily if SBP >160  . diclofenac sodium (VOLTAREN) 1 % GEL Apply topically 3 (three) times daily.  Marland Kitchen gabapentin (NEURONTIN) 300 MG capsule Take 1 capsule (300 mg total) by mouth 3 (three) times daily.  Marland Kitchen glucose blood (ACCU-CHEK AVIVA PLUS) test strip 1 each by Other route 2 (two) times daily. E11.9  . losartan (COZAAR) 25 MG tablet Take 1 tablet (25 mg total) by mouth daily.  . metFORMIN (GLUCOPHAGE) 1000 MG tablet Take 1 tablet (1,000 mg total) by mouth 2 (two) times daily with a meal.  . OVER THE COUNTER MEDICATION Take 1 capsule by mouth daily. "ultra flore balance"  . polyethylene glycol (MIRALAX) packet Take 17 g by mouth daily. (Patient taking differently: Take 17 g by mouth daily as needed. )  .  Rivaroxaban (XARELTO) 15 MG TABS tablet Take 1 tablet (15 mg total) by mouth daily with supper.   No facility-administered encounter medications on file as of 10/18/2017.     Activities of Daily  Living In your present state of health, do you have any difficulty performing the following activities: 10/18/2017  Hearing? N  Vision? N  Difficulty concentrating or making decisions? Y  Walking or climbing stairs? Y  Dressing or bathing? Y  Doing errands, shopping? Y  Preparing Food and eating ? Y  Using the Toilet? Y  In the past six months, have you accidently leaked urine? Y  Do you have problems with loss of bowel control? Y  Managing your Medications? Y  Managing your Finances? Y  Housekeeping or managing your Housekeeping? Y  Some recent data might be hidden    Patient Care Team: Gildardo Cranker, DO as PCP - General (Internal Medicine) Marin Olp, Rudell Cobb, MD as Consulting Physician (Oncology) [name], [name], LCSW as Social Worker (Licensed Clinical Social Worker) [name], RN as Registered Nurse [facility] and Palliative Medicine)    Assessment:   This is a routine wellness examination for [employer].  Exercise Activities and Dietary recommendations Current Exercise Habits: The patient does not participate in regular exercise at present, Exercise limited by: orthopedic condition(s);neurologic condition(s)  Goals    None      Fall Risk Fall Risk  10/18/2017 08/21/2017 04/11/2017 01/10/2017 11/16/2016  Falls in the past year? No No No No Yes  Number falls in past yr: - - - - 2 or more  Injury with Fall? - - - - No  Risk Factor Category  - - - - High Fall Risk  Follow up - - - - Falls prevention discussed   Is the patient's home free of loose throw rugs in walkways, pet beds, electrical cords, etc?   yes      Grab bars in the bathroom? yes      Handrails on the stairs?   yes      Adequate lighting?   yes  Depression Screen PHQ 2/9 Scores 10/18/2017 04/11/2017 07/26/2016 05/18/2016  PHQ - 2 Score 0 0 0 0     Cognitive Function MMSE - Mini Mental State Exam 10/18/2017 07/26/2016  Not completed: Unable to complete Unable to complete         There is no immunization  history on file for this patient.  Qualifies for Shingles Vaccine? Yes, educated and declined  Screening Tests Health Maintenance  Topic Date Due  . TETANUS/TDAP  02/05/2018 (Originally 08/07/1954)  . PNA vac Low Risk Adult (1 of 2 - PCV13) 02/05/2018 (Originally 08/08/2002)  . INFLUENZA VACCINE  03/20/2018 (Originally 12/07/2017)  . FOOT EXAM  04/11/2018 (Originally 08/08/1945)  . OPHTHALMOLOGY EXAM  04/18/2018 (Originally 08/08/1945)  . HEMOGLOBIN A1C  01/11/2018  . DEXA SCAN  Completed    Cancer Screenings: Lung: Low Dose CT Chest recommended if Age 8-80 years, 30 pack-year currently smoking OR have quit w/in 15years. Patient does not qualify. Breast:  Up to date on Mammogram? Yes   Up to date of Bone Density/Dexa? Yes Colorectal: up to date  Additional Screenings:  Hepatitis C Screening: declined TDAP due- patient's daughter wants to check with insurance first PNA 23 due-patient's daughter wants to check with insurance first Eye exam last done in 2019 per daughter. Records release form filled out    Plan:  I have personally reviewed and addressed the Medicare Annual Wellness questionnaire and have  noted the following in the patient's chart:  A. Medical and social history B. Use of alcohol, tobacco or illicit drugs  C. Current medications and supplements D. Functional ability and status E.  Nutritional status F.  Physical activity G. Advance directives H. List of other physicians I.  Hospitalizations, surgeries, and ER visits in previous 12 months J.  [place] to include hearing, vision, cognitive, depression L. Referrals and appointments - none  In addition, I have reviewed and discussed with patient certain preventive protocols, quality metrics, and best practice recommendations. A written personalized care plan for preventive services as well as general preventive health recommendations were provided to patient.  See attached scanned questionnaire for additional  information.   Signed,   Tyson Dense, RN Nurse Health Advisor  Patient Concerns: None

## 2017-10-18 NOTE — Patient Instructions (Addendum)
Continue current medications as ordered  Follow up with specialists as scheduled  Will call with lab results  [place]  Follow up in 3 mos for dementia, DM, anemia, osteoporosis, HTN  Denosumab injection What is this medicine? DENOSUMAB (den oh sue mab) slows bone breakdown. Prolia is used to treat osteoporosis in women after menopause and in men. Delton See is used to treat a high calcium level due to cancer and to prevent bone fractures and other bone problems caused by multiple myeloma or cancer bone metastases. Delton See is also used to treat giant cell tumor of the bone. This medicine may be used for other purposes; ask your health care provider or pharmacist if you have questions. COMMON BRAND NAME(S): Prolia, XGEVA What should I tell my health care provider before I take this medicine? They need to know if you have any of these conditions: -dental disease -having surgery or tooth extraction -infection -kidney disease -low levels of calcium or Vitamin D in the blood -malnutrition -on hemodialysis -skin conditions or sensitivity -thyroid or parathyroid disease -an unusual reaction to denosumab, other medicines, foods, dyes, or preservatives -pregnant or trying to get pregnant -breast-feeding How should I use this medicine? This medicine is for injection under the skin. It is given by a health care professional in a hospital or clinic setting. If you are getting Prolia, a special MedGuide will be given to you by the pharmacist with each prescription and refill. Be sure to read this information carefully each time. For Prolia, talk to your pediatrician regarding the use of this medicine in children. Special care may be needed. For Delton See, talk to your pediatrician regarding the use of this medicine in children. While this drug may be prescribed for children as young as 13 years for selected conditions, precautions do apply. Overdosage: If you think you  have taken too much of this medicine contact a poison control center or emergency room at once. NOTE: This medicine is only for you. Do not share this medicine with others. What if I miss a dose? It is important not to miss your dose. Call your doctor or health care professional if you are unable to keep an appointment. What may interact with this medicine? Do not take this medicine with any of the following medications: -other medicines containing denosumab This medicine may also interact with the following medications: -medicines that lower your chance of fighting infection -steroid medicines like prednisone or cortisone This list may not describe all possible interactions. Give your health care provider a list of all the medicines, herbs, non-prescription drugs, or dietary supplements you use. Also tell them if you smoke, drink alcohol, or use illegal drugs. Some items may interact with your medicine. What should I watch for while using this medicine? Visit your doctor or health care professional for regular checks on your progress. Your doctor or health care professional may order blood tests and other tests to see how you are doing. Call your doctor or health care professional for advice if you get a fever, chills or sore throat, or other symptoms of a cold or flu. Do not treat yourself. This drug may decrease your body's ability to fight infection. Try to avoid being around people who are sick. You should make sure you get enough calcium and vitamin D while you are taking this medicine, unless your doctor tells you not to. Discuss the foods you eat and the vitamins you take with your health care professional. See your dentist  regularly. Brush and floss your teeth as directed. Before you have any dental work done, tell your dentist you are receiving this medicine. Do not become pregnant while taking this medicine or for 5 months after stopping it. Talk with your doctor or health care  professional about your birth control options while taking this medicine. Women should inform their doctor if they wish to become pregnant or think they might be pregnant. There is a potential for serious side effects to an unborn child. Talk to your health care professional or pharmacist for more information. What side effects may I notice from receiving this medicine? Side effects that you should report to your doctor or health care professional as soon as possible: -allergic reactions like skin rash, itching or hives, swelling of the face, lips, or tongue -bone pain -breathing problems -dizziness -jaw pain, especially after dental work -redness, blistering, peeling of the skin -signs and symptoms of infection like fever or chills; cough; sore throat; pain or trouble passing urine -signs of low calcium like fast heartbeat, muscle cramps or muscle pain; pain, tingling, numbness in the hands or feet; seizures -unusual bleeding or bruising -unusually weak or tired Side effects that usually do not require medical attention (report to your doctor or health care professional if they continue or are bothersome): -constipation -diarrhea -headache -joint pain -loss of appetite -muscle pain -runny nose -tiredness -upset stomach This list may not describe all possible side effects. Call your doctor for medical advice about side effects. You may report side effects to FDA at 1-800-FDA-1088. Where should I keep my medicine? This medicine is only given in a clinic, doctor's office, or other health care setting and will not be stored at home. NOTE: This sheet is a summary. It may not cover all possible information. If you have questions about this medicine, talk to your doctor, pharmacist, or health care provider.  2018 Elsevier/Gold Standard (2016-05-17 19:17:21)

## 2017-10-19 LAB — HEMOGLOBIN A1C
Hgb A1c MFr Bld: 6.9 % of total Hgb — ABNORMAL HIGH (ref <5.7)
Mean Plasma Glucose: 151 (calc)
eAG (mmol/L): 8.4 (calc)

## 2017-10-19 LAB — LIPID PANEL
Cholesterol: 150 mg/dL (ref <200)
HDL: 40 mg/dL — ABNORMAL LOW (ref >50)
LDL CHOLESTEROL (CALC): 93 mg/dL
NON-HDL CHOLESTEROL (CALC): 110 mg/dL (ref <130)
TRIGLYCERIDES: 78 mg/dL (ref <150)
Total CHOL/HDL Ratio: 3.8 (calc) (ref <5.0)

## 2017-10-26 ENCOUNTER — Other Ambulatory Visit: Payer: Medicare Other | Admitting: Licensed Clinical Social Worker

## 2017-10-26 ENCOUNTER — Other Ambulatory Visit: Payer: Medicare Other | Admitting: *Deleted

## 2017-10-26 DIAGNOSIS — Z515 Encounter for palliative care: Secondary | ICD-10-CM

## 2017-10-26 NOTE — Progress Notes (Signed)
COMMUNITY PALLIATIVE CARE SW NOTE  PATIENT NAME: [name] DOB: 01/10/1936 MRN: [id number]  PRIMARY CARE PROVIDER: Gildardo Cranker, DO  RESPONSIBLE PARTY:  Acct ID - Guarantor Home Phone Work Phone Relationship Acct Type  [id number] [name], [phone]  Self P/F     [address], [place], [place] [address]     PLAN OF CARE and INTERVENTIONS:             1. GOALS OF CARE/ ADVANCE CARE PLANNING:  Family goal is for patient to remain at home with no further hospitalizations. 2. SOCIAL/EMOTIONAL/SPIRITUAL ASSESSMENT/ INTERVENTIONS:  SW and Palliative Care RN, [name], met with patient, her private CNA, and patient's daughter-in-law, [name].  Patient was sitting at the dining room table eating oatmeal.  She appeared to eat very slowly.  Patient stated she had big feet when asked about her comfort.  She did not display any nonverbal indicators of pain.  She displayed a bright affect when answering questions.  [name] keeps a running list of patient's vital signs and provided to the RN.  SW provided active listening while [name] discussed her family that also resides in the home. 3. PATIENT/CAREGIVER EDUCATION/ COPING:  Continue providing education regarding Palliative Care.  Patient copes by responding to questions with short answers.  Patient's daughter-in-law, [name], copes by seeking pertinent information regarding patient care. 4. PERSONAL EMERGENCY PLAN:  [name] with contact patient's MD. 5. COMMUNITY RESOURCES COORDINATION/ HEALTH CARE NAVIGATION:  Patient's LTC life insurance enables patient to have in-home CNA care. 6. FINANCIAL/LEGAL CONCERNS/INTERVENTIONS:  None per family.     SOCIAL HX:  Social History   Tobacco Use  . Smoking status: Never Smoker  . Smokeless tobacco: Never Used  Substance Use Topics  . Alcohol use: No    CODE STATUS:  Full Code  ADVANCED DIRECTIVES: Family indicates patient has a LW and HCPOA MOST FORM COMPLETE:  [name] still has a MOST  form and will review it with patient's son. HOSPICE EDUCATION PROVIDED: Family is familiar with Hospice.  PPS:  Patient's intake is normal.  She is able to stand and pivot and walk with her walker. Duration of visit and documentation:  75 minutes.      Carolyn Corn Kingsley Herandez, LCSW

## 2017-10-27 NOTE — Progress Notes (Signed)
COMMUNITY PALLIATIVE CARE RN NOTE  PATIENT NAME: [name] DOB: [birth year] MRN: [id number]  PRIMARY CARE PROVIDER: Gildardo Cranker, DO  RESPONSIBLE PARTY:  Acct ID - Guarantor Home Phone Work Phone Relationship Acct Type  [id number] ARRABELLA, [phone]  Self P/F     [address], [place], [place] [address]    PLAN OF CARE and INTERVENTION:  1. ADVANCE CARE PLANNING/GOALS OF CARE: Remain in home with her son and daughter in law as caretakers and avoid hospitalizations 2. PATIENT/CAREGIVER EDUCATION: Reinforced Safety and Aspiration Precautions 3. DISEASE STATUS: Joint visit made with [place]. Daughter in Scientist, forensic present during visit. Patient sitting up at dining room table feeding herself oatmeal. Pleasant and smiling. Able to say some words and answer some simple questions, but often looked towards daughter in law for help in answering. Denies pain. Voltaren gel applied to left knee 2-3 times per day along with Neurontin for nerve pain in bilateral feet. No respiratory issues. Occasional dysphagia requiring medications to be either crushed or broken in half. Good po intake. Continues to eat 3 meals/day. Regular diet at this time with thin liquids. LBM 10/23/17. Patient has a BM about every 4-5 days. Caregiver gives Activia on Day 3 if patient does not have a BM and states this as being effective. Abdomen soft, round and non-distended with bowel sounds present x 4. Remains total care for all ADLs, except for feeding. Transported via wheelchair and transferred via 1-2 person assistance. Blood sugars stable at this time. Patient had appointment with PCP on 10/18/17 and Hgb A1C came down to 6.9. Patient has some issues at times with elevated BPs. Caregiver will give additional Clonidine for systolic BP > 299 which is effective. Continues on Cozaar as well. Allevyn pad being kept on patient's sacrum for protection. No open areas reported at this time. Reports sleeping well  during the night and usually takes a nap after lunch. No questions or concerns noted at this time.   HISTORY OF PRESENT ILLNESS:  This is a 82 yo female who resides with her son and daughter in law. Patient continues to be followed by Palliative Care Team. Next visit scheduled in 1 month.  CODE STATUS: DNR ADVANCED DIRECTIVES: Y MOST FORM: no (blank copy left in the home) PPS: 30%   PHYSICAL EXAM:   VITALS: Today's Vitals   10/26/17 1125  BP: (!) 166/89  Pulse: 76  Resp: 14  SpO2: 96%  PainSc: 0-No pain    LUNGS: decreased breath sounds CARDIAC: Cor RRR EXTREMITIES: non-pitting edema bilateral lower extremities L>R SKIN: Exposed skin dry and intact, Allevyn pad placed on sacrum for protection  NEURO: Alert and oriented to self only, intermittent confusion, generalized weakness, total care   (Duration of visit and documentation 60 minutes)    Daryl Eastern, RN, BSN

## 2017-11-30 ENCOUNTER — Other Ambulatory Visit: Payer: Medicare Other | Admitting: *Deleted

## 2017-11-30 ENCOUNTER — Other Ambulatory Visit: Payer: Medicare Other | Admitting: Licensed Clinical Social Worker

## 2017-11-30 DIAGNOSIS — M1712 Unilateral primary osteoarthritis, left knee: Secondary | ICD-10-CM | POA: Diagnosis not present

## 2017-12-05 ENCOUNTER — Other Ambulatory Visit: Payer: Medicare Other | Admitting: Licensed Clinical Social Worker

## 2017-12-05 ENCOUNTER — Other Ambulatory Visit: Payer: Medicare Other | Admitting: *Deleted

## 2017-12-05 DIAGNOSIS — Z515 Encounter for palliative care: Secondary | ICD-10-CM

## 2017-12-06 NOTE — Progress Notes (Signed)
COMMUNITY PALLIATIVE CARE SW NOTE  PATIENT NAME: Staley Budzinski DOB: 07-Dec-1935 MRN: [id number]  PRIMARY CARE PROVIDER: Gildardo Cranker, DO  RESPONSIBLE PARTY:  Acct ID - Guarantor Home Phone Work Phone Relationship Acct Type  [id number] JULINA, [phone]  Self P/F     [place], [place], [place] [address]     PLAN OF CARE and INTERVENTIONS:             1. GOALS OF CARE/ ADVANCE CARE PLANNING:  Goal is for patient to remain at home and no further hospitalizations. 2. SOCIAL/EMOTIONAL/SPIRITUAL ASSESSMENT/ INTERVENTIONS:  SW and Palliative Care RN, Daryl Eastern, met with patient and her daughter-in-law, [name].  Patient denied pain.  She gave good eye contact and a displayed a bright affect at times.  SW provided active listening and supportive counseling as [name] discussed patient's recent medical appointments. 3. PATIENT/CAREGIVER EDUCATION/ COPING:  Provided education regarding the MOST form to caregiver.  Patient will answer questions with a few words.  Her caregiver expresses her feelings openly. 4. PERSONAL EMERGENCY PLAN:  [name] will contact patient's MD. 5. COMMUNITY RESOURCES COORDINATION/ HEALTH CARE NAVIGATION:  Patient has in-home CNA care. 6. FINANCIAL/LEGAL CONCERNS/INTERVENTIONS:  None per family.     SOCIAL HX:  Social History   Tobacco Use  . Smoking status: Never Smoker  . Smokeless tobacco: Never Used  Substance Use Topics  . Alcohol use: No    CODE STATUS:  Full Code  ADVANCED DIRECTIVES:  LW and HCPOA MOST FORM COMPLETE:  Family has not reviewed patient's documentation indicating end of life wishes. HOSPICE EDUCATION PROVIDED:  Family is familiar with Hospice. PPS:  Patient's intake is normal.  She is in her w/c during the visit. Duration of visit and documentation:  75 minutes.      [name] Fayrene Towner, LCSW

## 2017-12-07 NOTE — Progress Notes (Signed)
COMMUNITY PALLIATIVE CARE RN NOTE  PATIENT NAME: [name] DOB: 08/16/1935 MRN: [id number]  PRIMARY CARE PROVIDER: Gildardo Cranker, DO  RESPONSIBLE PARTY:  Acct ID - Guarantor Home Phone Work Phone Relationship Acct Type  [id number] BREEAN, [phone]  Self P/F     [place], [place], [place] [address]    PLAN OF CARE and INTERVENTION:  1. ADVANCE CARE PLANNING/GOALS OF CARE: Remain at home with family, avoid hospitalizations 2. PATIENT/CAREGIVER EDUCATION: Reinforced Safe Transfers and Repositioning 3. DISEASE STATUS: Joint visit made with [place]. Patient sitting up in her wheelchair in her room feeding herself oatmeal. Patient alert, pleasant and smiling. Recently experiencing severe L knee pain last week and had an injection which was very effective. Patient denies pain at this time. She remains able to answer simple questions. Total care with all ADLs. Patient is able to feed herself, but does spill her food at times and family or caregiver must assist. She takes her medications mostly crushed in applesauce. CBG today 91. Good intake. Some days BP is elevated. [name] manages patient's medications. Patient is currently receiving Clonidine BID and Losartan after lunch. If SBP is greater than 160, then she will administer a PRN Clonidine. She is incontinent of both bowel and bladder. Wears adult briefs. Caregivers continue to apply barrier cream to patient's bottom to prevent skin breakdown. If skin opens, calcium alginate is used to help with drainage. No open areas noted at this time.  She has an air mattress to help also. Continued pitting edema in bilateral lower extremities, L > R. [name] appreciative of visit.   HISTORY OF PRESENT ILLNESS:  This is a 82 yo female who resides with son and daughter in law. Palliative Care to continue to follow patient to assess overall condition and assist with symptom management needs. Next visit scheduled  in 1 month.  CODE STATUS: DNR  ADVANCED DIRECTIVES: Y  MOST FORM: no PPS: 30%   PHYSICAL EXAM:   VITALS: Today's Vitals   12/05/17 1150  BP: (!) 180/82  Pulse: 81  Resp: 15  SpO2: 99%  PainSc: 0-No pain    LUNGS: clear to auscultation  CARDIAC: Cor RRR EXTREMITIES: 3+ edema noted to L leg/foot and 2+ to R leg/foot SKIN: Exposed skin dry and intact, barrier cream utilized after each incontinent episode  NEURO: Alert and oriented to self, pleasant mood, generalized weakness, non ambulatory   (Duration of visit and documentation 75 minutes)    Daryl Eastern, RN, BSN

## 2017-12-27 ENCOUNTER — Encounter: Payer: Self-pay | Admitting: Internal Medicine

## 2018-01-04 ENCOUNTER — Other Ambulatory Visit: Payer: Medicare Other | Admitting: Licensed Clinical Social Worker

## 2018-01-04 ENCOUNTER — Other Ambulatory Visit: Payer: Medicare Other | Admitting: *Deleted

## 2018-01-04 DIAGNOSIS — Z515 Encounter for palliative care: Secondary | ICD-10-CM

## 2018-01-06 NOTE — Progress Notes (Signed)
COMMUNITY PALLIATIVE CARE SW NOTE  PATIENT NAME: [name] DOB: [birth year] MRN: [id number]  PRIMARY CARE PROVIDER: Gildardo Cranker, DO  RESPONSIBLE PARTY:  Acct ID - Guarantor Home Phone Work Phone Relationship Acct Type  [id number] ISSIS, [phone]  Self P/F     [place], [place], [place] [address]     PLAN OF CARE and INTERVENTIONS:             1. GOALS OF CARE/ ADVANCE CARE PLANNING:  Family does not wish for patient to be hospitalized and to remain in their home.  Full Code 2. SOCIAL/EMOTIONAL/SPIRITUAL ASSESSMENT/ INTERVENTIONS:  SW and Palliative Care RN, Daryl Eastern, met with patient and her daughter-in-law, [name].  Patient was in the living room in her w/c.  She was observing young children playing with blocks and she stated how much she liked blocks.  Patient denied pain.  She appeared to become frustrated with SW questions at one point.  RN discussed patient's air mattress that was removed recently.  Encouraged [name] to continue self care. 3. PATIENT/CAREGIVER EDUCATION/ COPING:  Patient will answer questions.  4. PERSONAL EMERGENCY PLAN:  Family will contact patient's MD. 5. COMMUNITY RESOURCES COORDINATION/ HEALTH CARE NAVIGATION:  Patient has CNA care in the home. 6. FINANCIAL/LEGAL CONCERNS/INTERVENTIONS:  None per family.     SOCIAL HX:  Social History   Tobacco Use  . Smoking status: Never Smoker  . Smokeless tobacco: Never Used  Substance Use Topics  . Alcohol use: No    CODE STATUS:  Full Code  ADVANCED DIRECTIVES:  LW and HCPOA MOST FORM COMPLETE: No HOSPICE EDUCATION PROVIDED:  No PPS:  Patient's appetite is normal.  She feeds herself but requires assistance with all other ADLs. Duration of visit and documentation:  75 minutes.      [name] Dakarri Kessinger, LCSW

## 2018-01-09 NOTE — Progress Notes (Signed)
COMMUNITY PALLIATIVE CARE RN NOTE  PATIENT NAME: [name] DOB: 1936-04-15 MRN: [id number]  PRIMARY CARE PROVIDER: Gildardo Cranker, DO  RESPONSIBLE PARTY:  Acct ID - Guarantor Home Phone Work Phone Relationship Acct Type  [id number] MAYLEA, [phone]  Self P/F     [place], [place], [place] [address]    PLAN OF CARE and INTERVENTION:  1. ADVANCE CARE PLANNING/GOALS OF CARE: Remain at home for as long as possible, avoid hospitalizations 2. PATIENT/CAREGIVER EDUCATION: Reinforced Safe Transfers and Mobility, Wound Prevention 3. DISEASE STATUS:  Joint visit made with [place]. Daughter in Scientist, forensic present during visit as well as hired caregiver. Patient sitting up in her wheelchair awake and alert. Able to answer some simple questions, but does have some word finding difficulties and intermittent confusion. She did become somewhat irritable at start of visit, but was able to be redirected. She denies experiencing any pain. Her intake is good. She continues to eat 3 meals/day. Family tries to allow her to feed herself, however she often spills food. Medications given crushed at times, and other times she is able to swallow whole. She seems tired today. Continues with 2-3+ pitting edema in bilateral lower extremities, L>R. Her sacral wound to her bottom has healed, however she is experiencing some redness. [name] advised that [employer] came and picked up the alternating air mattress last week, she feels possibly d/t wound improvement. However she still fees that this mattress is needed, as her bottom is starting to get red along with a line of redness down the side of her leg. She currently has a Wellsite geologist on her bed. Barrier cream being used after each incontinent episode. She is total care for bathing, dressing and transfers and incontinent of both bowel and bladder.   HISTORY OF PRESENT ILLNESS:  This is a 82 yo female who resides with her son and daughter in  law. Palliative Care continues to follow patient to assess overall condition and assist with symptom management needs. Next visit scheduled in 1 month.  CODE STATUS: DNR ADVANCED DIRECTIVES: Y MOST FORM: no PPS: 30%   PHYSICAL EXAM:   VITALS: Today's Vitals   01/04/18 1123  BP: (!) 152/80  Pulse: 81  Resp: 15  SpO2: 96%  PainSc: 0-No pain    LUNGS: clear to auscultation  CARDIAC: Cor RRR EXTREMITIES: 2+ pitting edema noted to R lower leg and 3+ in left lower leg SKIN: Exposed skin dry and intact, small reddened area noted to sacrum  NEURO: Alert and oriented to self, intermittent confusion, non-ambulatory, transported via wheelchair   (Duration of visit and documentation 75 minutes)    Daryl Eastern, RN, BSN

## 2018-01-16 ENCOUNTER — Telehealth: Payer: Self-pay

## 2018-01-16 NOTE — Telephone Encounter (Signed)
Request for APP &P faxed to Dr. [place] office.

## 2018-01-17 ENCOUNTER — Other Ambulatory Visit: Payer: Self-pay | Admitting: *Deleted

## 2018-01-17 DIAGNOSIS — Z86718 Personal history of other venous thrombosis and embolism: Secondary | ICD-10-CM

## 2018-01-17 MED ORDER — LOSARTAN POTASSIUM 25 MG PO TABS: 25.0000 mg | ORAL_TABLET | Freq: Every day | ORAL | 1 refills | Status: DC

## 2018-01-17 MED ORDER — RIVAROXABAN 15 MG PO TABS: 15.0000 mg | ORAL_TABLET | Freq: Every day | ORAL | 1 refills | Status: DC

## 2018-01-17 NOTE — Telephone Encounter (Signed)
[name], daughter requested. Faxed.

## 2018-01-23 ENCOUNTER — Ambulatory Visit: Payer: Medicare Other | Admitting: Internal Medicine

## 2018-01-30 ENCOUNTER — Encounter: Payer: Self-pay | Admitting: Internal Medicine

## 2018-01-30 ENCOUNTER — Ambulatory Visit (INDEPENDENT_AMBULATORY_CARE_PROVIDER_SITE_OTHER): Payer: Medicare Other | Admitting: Internal Medicine

## 2018-01-30 VITALS — BP 130/90 | HR 87 | Temp 98.3°F | Ht 67.0 in | Wt 130.0 lb

## 2018-01-30 DIAGNOSIS — L98421 Non-pressure chronic ulcer of back limited to breakdown of skin: Secondary | ICD-10-CM | POA: Diagnosis not present

## 2018-01-30 DIAGNOSIS — E785 Hyperlipidemia, unspecified: Secondary | ICD-10-CM

## 2018-01-30 DIAGNOSIS — E1169 Type 2 diabetes mellitus with other specified complication: Secondary | ICD-10-CM | POA: Diagnosis not present

## 2018-01-30 DIAGNOSIS — D509 Iron deficiency anemia, unspecified: Secondary | ICD-10-CM | POA: Diagnosis not present

## 2018-01-30 DIAGNOSIS — F0390 Unspecified dementia without behavioral disturbance: Secondary | ICD-10-CM

## 2018-01-30 DIAGNOSIS — I10 Essential (primary) hypertension: Secondary | ICD-10-CM | POA: Diagnosis not present

## 2018-01-30 DIAGNOSIS — I890 Lymphedema, not elsewhere classified: Secondary | ICD-10-CM | POA: Diagnosis not present

## 2018-01-30 DIAGNOSIS — R54 Age-related physical debility: Secondary | ICD-10-CM | POA: Diagnosis not present

## 2018-01-30 DIAGNOSIS — E1142 Type 2 diabetes mellitus with diabetic polyneuropathy: Secondary | ICD-10-CM | POA: Diagnosis not present

## 2018-01-30 NOTE — Progress Notes (Signed)
Patient ID: Mone Commisso, female   DOB: Oct 24, 1935, 82 y.o.   MRN: [id number]   Location:  [facility] OFFICE  Provider: DR Arletha Grippe  Code Status:  Goals of Care:  Advanced Directives 01/30/2018  Does Patient Have a Medical Advance Directive? Yes  Type of Advance Directive [place]  Does patient want to make changes to medical advance directive? No - Patient declined  Copy of [place] in Chart? Yes     Chief Complaint  Patient presents with  . Medical Management of Chronic Issues    3 month follow up visit for dementia, DM, anemia, Osteoporosis, HTN R/S from 01/23/18  . Immunizations    Patient's family declined flu vaccine     HPI: Patient is a 82 y.o. female seen today for medical management of chronic diseases.    BS improved at home. No low blood sugar reactions. Occasionally her toes get irritated. Daughter some times reduces metformin to 1/2 tab if BS in 8s. She was seen in the ED in Apr 2019 for abdominal pain and dx with constipation.  No further issues with constipation with new bowel regimen. Wound on buttock healed but does "flare up" occasionally. Family applies calcium alginate and skin prep prn. She is a poor historian due to dementia. Hx obtained from chart.  Debility - chronic 2/2 age; She completed PT/OT through [facility]. Family member has not followed through with private duty PT to continue/maintain current activity level. She needs a lift to assist from sit to stand and transfer as well as lift chair. She is currently 2 person assist especially with toileting. Appetite great and she sleeps well. No falls.   Stage 2 wound on buttock - has healed. Family applies wound dsg prn. Air mattress removed due to healed wound. Family requests another air mattress to prevent new wounds.  Left knee pain - stable on voltaren gel. She had left knee pain and rec'd steroid injection in 08/2016.   Dementia - unchanged. currently not on any medication.  Family stopped exelon patch as she was not showing any signs of improvement in QOL. No behavior issues. Albumin 3.6  DM - improved; BS 80-90s fasting. She takes metformin 2000 mg daily (family increased dose July 12, 2017). She never started [place] as it cannot be crushed. Takes gabapentin for neuropathy. A1c 6.9% (prev 9.2%).; urine microalbumin/Cr ratio 104; LDL 93  Lymphedema - chronic and stable L>RLE. Stable. She does not take diuretic. Tried TED stockings in the past but could not tolerate them. She keeps legs elevated on pillow during the day time.  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 takes xeralto - lifetime. No bloody stools/easy bruising  Hyperlipidemia - stable on lipitor. LDL 93; HDL 40. No myalgias  HTN -  Controlled on clonidine and losartan. She is taking meds as ordered  Hypokalemia - stable on Kd[place]. K 4.3  GERD - stable off medication  Hx CVA - stable on xeralto. LDL 91. She did have TIA in May 2018 but sx's resolved. She saw neurology who told her to f/u prn. No new focal deficits  Iron deficiency anemia - stable. followed by hematology Dr Martha Clan. Hgb 12.6; iron 54; ferritin 97  Osteoporosis - DXA in Apr 2019 revealed T score -3.6 in left femur. She is taking calcium with D. Declines bisphosphonate tx at this time. She has diffiulty swallowing pills.   Past Medical History:  Diagnosis Date  . Dementia   . Diabetes mellitus type II  03/21/2011  . DVT (deep venous thrombosis) ([place]) 03/21/2011  . Hyperlipidemia 03/21/2011  . Hypertension 03/21/2011  . Lymphedema of leg 03/21/2011  . Pulmonary embolism ([place]) 03/21/2011  . Stroke [facility])     Past Surgical History:  Procedure Laterality Date  . CHOLECYSTECTOMY  2015  . SKIN TAG REMOVAL  07/25/2016  . VAGINAL HYSTERECTOMY     unknown of date  . VASCULAR SURGERY       reports that she has never smoked. She has never used smokeless tobacco. She reports that she does not drink alcohol or use drugs. Social History    Socioeconomic History  . Marital status: Widowed    Spouse name: Not on file  . Number of children: Not on file  . Years of education: Not on file  . Highest education level: Not on file  Occupational History  . Not on file  Social Needs  . Financial resource strain: Not hard at all  . Food insecurity:    Worry: Never true    Inability: Never true  . Transportation needs:    Medical: No    Non-medical: No  Tobacco Use  . Smoking status: Never Smoker  . Smokeless tobacco: Never Used  Substance and Sexual Activity  . Alcohol use: No  . Drug use: No  . Sexual activity: Never  Lifestyle  . Physical activity:    Days per week: 0 days    Minutes per session: 0 min  . Stress: Not at all  Relationships  . Social connections:    Talks on phone: More than three times a week    Gets together: More than three times a week    Attends religious service: More than 4 times per year    Active member of club or organization: No    Attends meetings of clubs or organizations: Never    Relationship status: Widowed  . Intimate partner violence:    Fear of current or ex partner: No    Emotionally abused: No    Physically abused: No    Forced sexual activity: No  Other Topics Concern  . Not on file  Social History Narrative   Diet?       Do you drink/eat things with caffeine? occasional diet soda      Marital status?   Widowed/divorced                                 What year were you married? [place] you live in a house, apartment, assisted living, condo, trailer, etc.? home      Is it one or more stories? More (mainly living on one floor)      How many persons live in your home?      Do you have any pets in your home? (please list) no      Current or past profession: teacher      Do you exercise?       Physical therapy                              Type & how often? 2-3 per week      Do you have a living will? yes      Do you have a DNR form?      ?  If not, do you want to discuss one?      Do you have signed POA/HPOA for forms? Yes   11/29/16 Lives at home wih family, son, daughter in law, patrice and grand children.  Has 3 children.  She is widowed, retired.  Has BA- education level.      Family History  Problem Relation Age of Onset  . Heart attack Mother   . COPD Sister   . Stroke Sister   . Bipolar disorder Daughter     No Known Allergies  Outpatient Encounter Medications as of 01/30/2018  Medication Sig  . ACCU-CHEK SOFTCLIX LANCETS lancets 1 each by Other route daily. Use as instructed Dx: E11.9  . acetaminophen (TYLENOL) 500 MG tablet Take 500 mg by mouth every 6 (six) hours as needed (pain).   Marland Kitchen atorvastatin (LIPITOR) 20 MG tablet Take 1 tablet (20 mg total) by mouth daily at 6 PM.  . cholecalciferol (VITAMIN D) 1000 units tablet Take 5,000 Units by mouth daily.   . cloNIDine (CATAPRES) 0.1 MG tablet Take 1 tablet (0.1 mg total) by mouth 2 (two) times daily. May give additional 1 tablet daily if SBP >160  . diclofenac sodium (VOLTAREN) 1 % GEL Apply topically 3 (three) times daily.  Marland Kitchen gabapentin (NEURONTIN) 300 MG capsule Take 1 capsule (300 mg total) by mouth 3 (three) times daily.  Marland Kitchen glucose blood (ACCU-CHEK AVIVA PLUS) test strip 1 each by Other route 2 (two) times daily. E11.9  . losartan (COZAAR) 25 MG tablet Take 1 tablet (25 mg total) by mouth daily.  . metFORMIN (GLUCOPHAGE) 1000 MG tablet Take 1 tablet (1,000 mg total) by mouth 2 (two) times daily with a meal.  . polyethylene glycol (MIRALAX) packet Take 17 g by mouth daily. (Patient taking differently: Take 17 g by mouth daily as needed. )  . Rivaroxaban (XARELTO) 15 MG TABS tablet Take 1 tablet (15 mg total) by mouth daily with supper.  . B Complex Vitamins (VITAMIN B COMPLEX PO) Take 1 tablet by mouth daily.   Marland Kitchen OVER THE COUNTER MEDICATION Take 1 capsule by mouth daily. "ultra flore balance"   No facility-administered encounter medications on  file as of 01/30/2018.     Review of Systems:  Review of Systems  Unable to perform ROS: Dementia    Health Maintenance  Topic Date Due  . TETANUS/TDAP  02/05/2018 (Originally 08/07/1954)  . PNA vac Low Risk Adult (1 of 2 - PCV13) 02/05/2018 (Originally 08/08/2002)  . INFLUENZA VACCINE  03/20/2018 (Originally 12/07/2017)  . FOOT EXAM  04/11/2018 (Originally 08/08/1947)  . OPHTHALMOLOGY EXAM  04/18/2018 (Originally 01/24/2018)  . HEMOGLOBIN A1C  04/19/2018  . DEXA SCAN  Completed    Physical Exam: Vitals:   01/30/18 1121  BP: 130/90  Pulse: 87  Temp: 98.3 F (36.8 C)  TempSrc: Oral  SpO2: 96%  Weight: 130 lb (59 kg)  Height: 5\' 7"  (1.702 m)   Body mass index is 20.36 kg/m. Physical Exam  Constitutional: She appears well-developed.  HENT:  Mouth/Throat: Oropharynx is clear and moist. No oropharyngeal exudate.  MMM; no oral thrush  Eyes: Pupils are equal, round, and reactive to light. No scleral icterus.  Neck: Neck supple. Carotid bruit is not present. No tracheal deviation present. No thyromegaly present.  Cardiovascular: Normal rate, regular rhythm and intact distal pulses. Exam reveals no gallop and no friction rub.  Murmur (1/6 SEM) heard. +2 pitting LE edema b/l. No calf TTP  Pulmonary/Chest: Effort normal and breath sounds  normal. No stridor. No respiratory distress. She has no wheezes. She has no rales.  Abdominal: Soft. Normal appearance and bowel sounds are normal. She exhibits no distension and no mass. There is no hepatomegaly. There is no tenderness. There is no rigidity, no rebound and no guarding. No hernia.  obese  Musculoskeletal: She exhibits edema (small and large joint).  Lymphadenopathy:    She has no cervical adenopathy.  Neurological: She is alert. She has normal reflexes.  Skin: Skin is warm and dry. No rash noted.  Psychiatric: She has a normal mood and affect. Her behavior is normal. Thought content normal.    Labs reviewed: Basic Metabolic  Panel: Recent Labs    07/24/17 1418 08/07/17 2047 08/15/17 1307  NA 142 138 146*  K 3.5 4.5 4.3  CL 102 100* 103  CO2 30 25 32  GLUCOSE 252* 132* 166*  BUN 15 21* 15  CREATININE 0.53* 0.40* 0.90  CALCIUM 9.3 9.3 9.4   Liver Function Tests: Recent Labs    02/28/17 1059 08/07/17 2047 08/15/17 1307  AST 19 18 21   ALT 14 13* 23  ALKPHOS 68 69 64  BILITOT 0.50 0.5 0.5  PROT 6.6 7.5 7.1  ALBUMIN 3.0* 3.9 3.6   No results for input(s): LIPASE, AMYLASE in the last 8760 hours. No results for input(s): AMMONIA in the last 8760 hours. CBC: Recent Labs    02/28/17 1059 08/07/17 2047 08/15/17 1307  WBC 3.9 6.9 4.9  NEUTROABS 2.0 5.4 2.5  HGB 11.6 12.4 12.6  HCT 36.0 39.1 39.5  MCV 91 91.4 91.6  PLT 190 208 207   Lipid Panel: Recent Labs    07/11/17 0950 10/18/17 1032  CHOL 154 150  HDL 44* 40*  LDLCALC 91 93  TRIG 92 78  CHOLHDL 3.5 3.8   Lab Results  Component Value Date   HGBA1C 6.9 (H) 10/18/2017    Procedures since last visit: No results found.  Assessment/Plan   ICD-10-CM   1. Age-related physical debility R54   2. Dementia without behavioral disturbance, unspecified dementia type F03.90   3. Skin ulcer of sacrum, limited to breakdown of skin (HCC) L98.421    recurrent flares  4. Hyperlipidemia associated with type 2 diabetes mellitus ([place]) E11.69    E78.5   5. Lymphedema of left lower extremity I89.0   6. Type 2 diabetes mellitus with diabetic polyneuropathy, without long-term current use of insulin (HCC) E11.42   7. Essential hypertension I10   8. Iron deficiency anemia, unspecified iron deficiency anemia type D50.9      Will call with lab results  Continue current medications as ordered  Follow up with specialists as scheduled  Follow up in 3 mos with Janett Billow for DM, dementia, HTN, hx DVT. Fasting labs prior to appt (lipid panel and a1c)   Koal Eslinger S. Perlie Gold  [facility] [address] [place], [place] [address] [phone] Cell (Monday-Friday 8 AM - 5 PM) [phone] After 5 PM and follow prompts

## 2018-01-30 NOTE — Patient Instructions (Addendum)
Will call with lab results  Continue current medications as ordered  Follow up with specialists as scheduled  Follow up in 3 mos with Janett Billow for DM, dementia, HTN, hx DVT. Fasting labs prior to appt.

## 2018-01-31 ENCOUNTER — Other Ambulatory Visit: Payer: Medicare Other | Admitting: Licensed Clinical Social Worker

## 2018-01-31 ENCOUNTER — Other Ambulatory Visit: Payer: Medicare Other | Admitting: *Deleted

## 2018-01-31 DIAGNOSIS — Z515 Encounter for palliative care: Secondary | ICD-10-CM

## 2018-01-31 NOTE — Progress Notes (Signed)
COMMUNITY PALLIATIVE CARE RN NOTE  PATIENT NAME: [name] DOB: [birth year] MRN: [id number]  PRIMARY CARE PROVIDER: Gildardo Cranker, DO  RESPONSIBLE PARTY:  Acct ID - Guarantor Home Phone Work Phone Relationship Acct Type  [id number] KATHERYN, [phone]  Self P/F     [place], [place], [place] [address]    PLAN OF CARE and INTERVENTION:  1. ADVANCE CARE PLANNING/GOALS OF CARE: [name] wants patient to remain in her home with hired caregivers and avoid hospitalizations 2. PATIENT/CAREGIVER EDUCATION: Reinforced Safe Mobility/Transfers 3. DISEASE STATUS: Joint visit made with [place]. Daughter in Scientist, forensic and hired caregiver present during visit. Patient sitting up in wheelchair in the living room upon arrival. She is able to answer some simple questions, but is slow to respond. Caregiver reports patient does not appear to be feeling well today. Transfer out of bed was more difficult today. She notices patient has times where she is leaning forward more d/t increased truncal weakness. She is no longer able to feed herself and must be fed. She is unable to bring the utensils all the way to her mouth, but does ok with finger foods. Occasional dyshagia, requiring medications to be crushed. Denies pain today. No dyspnea noted. Patient more quiet and withdrawn today. Pitting edema continues to bilateral lower extremities. Legs are elevated throughout the day. Old wounds to her bottom are currently closed, but has surrounding redness and irritation at times. Barrier cream being applied after each incontinent episode. She is at high risk for skin breakdown as she is immobile and incontinent. Her BPs continue to be elevated often in the mornings, but will usually decrease after medications are taken. Continues with PRN Clonidine if her systolic is above 798. Will continue to monitor.  HISTORY OF PRESENT ILLNESS:  This is a 82 yo female who resides in the home of  her daughter in law and son. Palliative Care Team continues to follow. Next visit scheduled in 1 month.  CODE STATUS: DNR ADVANCED DIRECTIVES: Y MOST FORM: no PPS: 30%   PHYSICAL EXAM:   VITALS: Today's Vitals   01/31/18 1106  BP: (!) 158/92  Pulse: 85  Resp: 16  SpO2: 97%  PainSc: 0-No pain    LUNGS: clear to auscultation  CARDIAC: Cor RRR EXTREMITIES: 3+ non- pitting edema to bilateral lower extremities SKIN: Exposed skin is dry and intact  NEURO: Alert to self, intermittent confusion, generalized weakness, total care   (Duration of visit and documentation 60 minutes)    Daryl Eastern, RN, BSN

## 2018-02-01 NOTE — Progress Notes (Signed)
COMMUNITY PALLIATIVE CARE SW NOTE  PATIENT NAME: [name] DOB: [birth year] MRN: [id number]  PRIMARY CARE PROVIDER: Gildardo Cranker, DO  RESPONSIBLE PARTY:  Acct ID - Guarantor Home Phone Work Phone Relationship Acct Type  [id number] [name], [name]  Self P/F     [place], [place], [place] [address]     PLAN OF CARE and INTERVENTIONS:             1. GOALS OF CARE/ ADVANCE CARE PLANNING:  Goal is for patient to remain at home with no further hospitalizations.  Patient is a DNR. 2. SOCIAL/EMOTIONAL/SPIRITUAL ASSESSMENT/ INTERVENTIONS:  SW and Palliative Care RN, [name], met with patient, her daughter-in-law, [name], and private caregiver.  Patient was in her w/c in the living room.  She was alert and answered questions.  She did not complete all of her sentences.  The CNA stated it took patient more time to get out of bed this morning and that she was not as talkative.  She also reported that patient is leaning more forward in her w/c indicating increased weakness. 3. PATIENT/CAREGIVER EDUCATION/ COPING:  Patient copes by responding if spoken to directly.  Continue providing education regarding disease progression. 4. PERSONAL EMERGENCY PLAN:  Family will contact patient's MD with medical concerns. 5. COMMUNITY RESOURCES COORDINATION/ HEALTH CARE NAVIGATION:  Patient has a CNA in the home. 6. FINANCIAL/LEGAL CONCERNS/INTERVENTIONS:  None per family.     SOCIAL HX:  Social History   Tobacco Use  . Smoking status: Never Smoker  . Smokeless tobacco: Never Used  Substance Use Topics  . Alcohol use: No    CODE STATUS:  DNR ADVANCED DIRECTIVES:  LW and HCPOA MOST FORM COMPLETE:  No HOSPICE EDUCATION PROVIDED:  No PPS:  Patient's appetite is normal.  She is unable to feed herself.  She requires assistance with all ADLs. Duration of visit and documentation:  45 minutes       Carolyn Corn Kami Kube, [name]

## 2018-02-15 ENCOUNTER — Other Ambulatory Visit: Payer: Medicare Other

## 2018-02-15 ENCOUNTER — Ambulatory Visit: Payer: Medicare Other | Admitting: Hematology & Oncology

## 2018-03-06 ENCOUNTER — Other Ambulatory Visit: Payer: Medicare Other | Admitting: *Deleted

## 2018-03-06 ENCOUNTER — Other Ambulatory Visit: Payer: Medicare Other | Admitting: Licensed Clinical Social Worker

## 2018-03-06 DIAGNOSIS — Z515 Encounter for palliative care: Secondary | ICD-10-CM

## 2018-03-07 NOTE — Progress Notes (Signed)
COMMUNITY PALLIATIVE CARE SW NOTE  PATIENT NAME: [name] DOB: 22-Jan-1936 MRN: [id number]  PRIMARY CARE PROVIDER: Gildardo Cranker, DO  RESPONSIBLE PARTY:  Acct ID - Guarantor Home Phone Work Phone Relationship Acct Type  [id number] Carolyn, [phone]  Self P/F     [place], [place], [place] [address]     PLAN OF CARE and INTERVENTIONS:             1. GOALS OF CARE/ ADVANCE CARE PLANNING:  Patient's family wishes for her to remain at home with them.  She has a DNR.  2. SOCIAL/EMOTIONAL/SPIRITUAL ASSESSMENT/ INTERVENTIONS:  SW and Palliative Care RN, [name], met with patient and her daughter-in-law, [name] and private caregiver.  Patient was in her w/c in the living room.  She appeared more withdrawn during this visit and was not as verbal.  She did not display any nonverbal indicators of pain. 3. PATIENT/CAREGIVER EDUCATION/ COPING:  Patient will answer questions with short answers if spoken to directly.  Patient's daughter-in-law is very attentive to patient's needs and will arrange MD appointments. 4. PERSONAL EMERGENCY PLAN:  A family member or caregiver is with patient at all times. 5. COMMUNITY RESOURCES COORDINATION/ HEALTH CARE NAVIGATION:  Patient also has hired caregivers. 6. FINANCIAL/LEGAL CONCERNS/INTERVENTIONS:  None per family.     SOCIAL HX:  Social History   Tobacco Use  . Smoking status: Never Smoker  . Smokeless tobacco: Never Used  Substance Use Topics  . Alcohol use: No    CODE STATUS:  DNR  ADVANCED DIRECTIVES: LW and HCPOA MOST FORM COMPLETE:  No HOSPICE EDUCATION PROVIDED: No PPS: Patient's appetite is normal.  She can eat independently using finger foods.  She is total care with all ADLs. Duration of visit and documentation:  45 minutes.      [name] Nikolaos Maddocks, LCSW

## 2018-03-08 NOTE — Progress Notes (Signed)
COMMUNITY PALLIATIVE CARE RN NOTE  PATIENT NAME: [name] DOB: March 22, 1936 MRN: [id number]  PRIMARY CARE PROVIDER: Gildardo Cranker, DO  RESPONSIBLE PARTY:  Acct ID - Guarantor Home Phone Work Phone Relationship Acct Type  [id number] AMYLIA, [phone]  Self P/F     [address], [place], [place] [address]    PLAN OF CARE and INTERVENTION:  1. ADVANCE CARE PLANNING/GOALS OF CARE: Remain at home with her son and daughter in law and avoid hospitalizations 2. PATIENT/CAREGIVER EDUCATION: Reinforced Safe Mobility/Transfers and Skin Pressure Relief 3. DISEASE STATUS: Joint visit made with Palliative Care SW, Lynn [name]. Patient sitting up in her wheelchair in the living room. She is able to make eye contact and answer some simple questions with short replies. She is slow to respond and has some word finding difficulties. She denies pain and no physical indicators of pain are noted throughout visit. Intake is normal, however she must be fed if food item requires utensils. She is able to feed herself finger foods once they are placed in her hand. She is total care for all ADLs. She is incontinent of both bowel and bladder. While sitting up in wheelchair, notice she is leaning towards the right and had to be repositioned. No wounds noted at this time, but area does become red at times. Barrier cream applied after each incontinent episode. Re-faxed document to PCP requesting order for an air mattress due to patient's incontinence, immobility and history of pressure ulcers. Her BPs are elevated requiring the use of Losartan and Clonidine. Caregiver is noticing that her diastolic numbers are increasing and is often found to be in the 100/110s. Will continue to monitor.    HISTORY OF PRESENT ILLNESS:  This is a 82 yo female who resides in the home with her son and daughter in law. Palliative Care Team continues to follow patient. Next visit scheduled in 1 month.   CODE STATUS: DNR ADVANCED  DIRECTIVES: Y MOST FORM: no PPS: 30%   PHYSICAL EXAM:   VITALS: Today's Vitals   03/06/18 1124  BP: (!) 168/102  Pulse: 89  Resp: 16  SpO2: 96%  PainSc: 0-No pain    LUNGS: clear to auscultation  CARDIAC: Cor RRR EXTREMITIES: 3+ and non-pitting edema to bilateral lower extremities SKIN: Exposed skin is dry and intact  NEURO: Alert and oriented to self, intermittent confusion, word finding difficulties, total care   (Duration of visit and documentation 75 minutes)    Daryl Eastern, RN, BSN

## 2018-03-19 ENCOUNTER — Ambulatory Visit: Payer: Medicare Other | Admitting: Hematology & Oncology

## 2018-03-19 ENCOUNTER — Other Ambulatory Visit: Payer: Medicare Other

## 2018-03-26 ENCOUNTER — Telehealth: Payer: Self-pay

## 2018-03-26 NOTE — Telephone Encounter (Signed)
Patients daughter called, patient a new area on her buttocks that has a skin break down. [name] is requesting calcium alginate and skin prep prn.  I informed [name] that patient needs an appointment and [name] states this is an ongoing concern. Per [name] patient seen for this several times and although it is a new area it is the same situation.   Please advise

## 2018-03-26 NOTE — Telephone Encounter (Signed)
We can do this if we can have a palliative care nurse come out to evaluate the area to make sure this is the proper treatment and to ensure she does not need to be evaluated further since I have not seen the area

## 2018-03-26 NOTE — Telephone Encounter (Signed)
I called [name] and informed her of Jessica's response. [name] states she will call Brayton Layman and see if they can come see patient this week and if not she will call back to schedule an appointment with Kathryne Hitch.Chrae B/CMA

## 2018-04-01 ENCOUNTER — Encounter (HOSPITAL_COMMUNITY): Payer: Self-pay

## 2018-04-01 ENCOUNTER — Emergency Department (HOSPITAL_COMMUNITY)
Admission: EM | Admit: 2018-04-01 | Discharge: 2018-04-01 | Disposition: A | Discharge status: Home / Self Care | Payer: Medicare Other | Attending: Emergency Medicine | Admitting: Emergency Medicine

## 2018-04-01 ENCOUNTER — Emergency Department (HOSPITAL_COMMUNITY): Payer: Medicare Other

## 2018-04-01 DIAGNOSIS — R531 Weakness: Secondary | ICD-10-CM | POA: Diagnosis not present

## 2018-04-01 DIAGNOSIS — Z7984 Long term (current) use of oral hypoglycemic drugs: Secondary | ICD-10-CM | POA: Diagnosis not present

## 2018-04-01 DIAGNOSIS — Z7901 Long term (current) use of anticoagulants: Secondary | ICD-10-CM | POA: Insufficient documentation

## 2018-04-01 DIAGNOSIS — Z79899 Other long term (current) drug therapy: Secondary | ICD-10-CM | POA: Diagnosis not present

## 2018-04-01 DIAGNOSIS — E1142 Type 2 diabetes mellitus with diabetic polyneuropathy: Secondary | ICD-10-CM | POA: Insufficient documentation

## 2018-04-01 DIAGNOSIS — F0390 Unspecified dementia without behavioral disturbance: Secondary | ICD-10-CM | POA: Diagnosis not present

## 2018-04-01 DIAGNOSIS — R410 Disorientation, unspecified: Secondary | ICD-10-CM | POA: Diagnosis not present

## 2018-04-01 DIAGNOSIS — I10 Essential (primary) hypertension: Secondary | ICD-10-CM | POA: Diagnosis not present

## 2018-04-01 LAB — URINALYSIS, ROUTINE W REFLEX MICROSCOPIC
BILIRUBIN URINE: NEGATIVE
Glucose, UA: NEGATIVE mg/dL
KETONES UR: 5 mg/dL — AB
LEUKOCYTES UA: NEGATIVE
NITRITE: NEGATIVE
PROTEIN: 100 mg/dL — AB
SPECIFIC GRAVITY, URINE: 1.018 (ref 1.005–1.030)
pH: 5 (ref 5.0–8.0)

## 2018-04-01 LAB — COMPREHENSIVE METABOLIC PANEL
ALT: 14 U/L (ref 0–44)
AST: 19 U/L (ref 15–41)
Albumin: 3.6 g/dL (ref 3.5–5.0)
Alkaline Phosphatase: 54 U/L (ref 38–126)
Anion gap: 14 (ref 5–15)
BUN: 14 mg/dL (ref 8–23)
CHLORIDE: 101 mmol/L (ref 98–111)
CO2: 24 mmol/L (ref 22–32)
CREATININE: 0.48 mg/dL (ref 0.44–1.00)
Calcium: 9.3 mg/dL (ref 8.9–10.3)
GFR calc Af Amer: >60 mL/min (ref >60)
GFR calc non Af Amer: >60 mL/min (ref >60)
Glucose, Bld: 117 mg/dL — ABNORMAL HIGH (ref 70–99)
POTASSIUM: 3.6 mmol/L (ref 3.5–5.1)
Sodium: 139 mmol/L (ref 135–145)
Total Bilirubin: 0.9 mg/dL (ref 0.3–1.2)
Total Protein: 7.1 g/dL (ref 6.5–8.1)

## 2018-04-01 LAB — CBC WITH DIFFERENTIAL/PLATELET
ABS IMMATURE GRANULOCYTES: 0.03 10*3/uL (ref 0.00–0.07)
BASOS PCT: 0 %
Basophils Absolute: 0 10*3/uL (ref 0.0–0.1)
EOS ABS: 0 10*3/uL (ref 0.0–0.5)
Eosinophils Relative: 0 %
HEMATOCRIT: 38.7 % (ref 36.0–46.0)
Hemoglobin: 11.9 g/dL — ABNORMAL LOW (ref 12.0–15.0)
Immature Granulocytes: 1 %
Lymphocytes Relative: 23 %
Lymphs Abs: 1.5 10*3/uL (ref 0.7–4.0)
MCH: 29.2 pg (ref 26.0–34.0)
MCHC: 30.7 g/dL (ref 30.0–36.0)
MCV: 95.1 fL (ref 80.0–100.0)
MONO ABS: 0.6 10*3/uL (ref 0.1–1.0)
MONOS PCT: 10 %
NEUTROS ABS: 4.4 10*3/uL (ref 1.7–7.7)
Neutrophils Relative %: 66 %
PLATELETS: 145 10*3/uL — AB (ref 150–400)
RBC: 4.07 MIL/uL (ref 3.87–5.11)
RDW: 13.1 % (ref 11.5–15.5)
WBC: 6.6 10*3/uL (ref 4.0–10.5)
nRBC: 0 % (ref 0.0–0.2)

## 2018-04-01 LAB — RAPID URINE DRUG SCREEN, HOSP PERFORMED
AMPHETAMINES: NOT DETECTED
Barbiturates: NOT DETECTED
Benzodiazepines: NOT DETECTED
Cocaine: NOT DETECTED
OPIATES: NOT DETECTED
Tetrahydrocannabinol: NOT DETECTED

## 2018-04-01 LAB — CBG MONITORING, ED
GLUCOSE-CAPILLARY: 87 mg/dL (ref 70–99)
Glucose-Capillary: 66 mg/dL — ABNORMAL LOW (ref 70–99)
Glucose-Capillary: 66 mg/dL — ABNORMAL LOW (ref 70–99)
Glucose-Capillary: 114 mg/dL — ABNORMAL HIGH (ref 70–99)

## 2018-04-01 LAB — I-STAT CHEM 8, ED
BUN: 18 mg/dL (ref 8–23)
CREATININE: 0.3 mg/dL — AB (ref 0.44–1.00)
Calcium, Ion: 1.06 mmol/L — ABNORMAL LOW (ref 1.15–1.40)
Chloride: 104 mmol/L (ref 98–111)
GLUCOSE: 118 mg/dL — AB (ref 70–99)
HCT: 39 % (ref 36.0–46.0)
HEMOGLOBIN: 13.3 g/dL (ref 12.0–15.0)
Potassium: 3.7 mmol/L (ref 3.5–5.1)
Sodium: 141 mmol/L (ref 135–145)
TCO2: 30 mmol/L (ref 22–32)

## 2018-04-01 LAB — I-STAT TROPONIN, ED: Troponin i, poc: 0.02 ng/mL (ref 0.00–0.08)

## 2018-04-01 MED ORDER — HYDRALAZINE HCL 20 MG/ML IJ SOLN
5.0000 mg | Freq: Once | INTRAMUSCULAR | Status: DC
  Filled 2018-04-01: qty 1

## 2018-04-01 MED ORDER — LABETALOL HCL 5 MG/ML IV SOLN
10.0000 mg | Freq: Once | INTRAVENOUS | Status: AC
  Administered 2018-04-01: 10 mg via INTRAVENOUS
  Filled 2018-04-01: qty 4

## 2018-04-01 NOTE — ED Provider Notes (Signed)
Medical screening examination/treatment/procedure(s) were conducted as a shared visit with non-physician practitioner(s) and myself.  I personally evaluated the patient during the encounter.  EKG Interpretation  Date/Time:  Sunday April 01 2018 10:49:19 EST Ventricular Rate:  76 PR Interval:    QRS Duration: 80 QT Interval:  372 QTC Calculation: 419 R Axis:   65 Text Interpretation:  SR Probable anteroseptal infarct, old Minimal ST depression Baseline wander in lead(s) V2 V6 no sig change from previous Confirmed by Charlesetta Shanks [phone]) on 04/01/2018 3:26:02 PM Patient is 82 year old female with history of dementia cared for in her home.  Patient's daughter-in-law gives history.  She reports that the patient had been seeming to turn her head more to the left and using her right side slightly more.  Patient also has a aide who comes to assist with ADLs.  The nurses aide felt that the patient was not at baseline and thought she seemed weak on the left side.  Onset is unclear.  Patient's last well would likely be the day before symptoms are noted.  Patient is alert and in no distress.  Heart is regular.  No respiratory distress breath sounds are clear.  Patient is chronic lower extremity lymphedema. Patient appears to have some drooping of the left side of the face.  She is speaking but speech is somewhat slurred.  Patient has difficulty following commands.  Grip strength on the left seems somewhat less compared to the right.  She has lymphedema and poor use of the lower extremities at baseline.  Cannot really adequately follow commands for strength testing.  Patient does have objective left facial droop.  It is unclear with her history of dementia and stroke how acute this is but family and aide's note that she seems to have a localizing difference.  MRI obtained to rule out acute stroke.  No acute findings on MRI.  I agree with plan of management.   Charlesetta Shanks, MD 04/04/18 [phone]

## 2018-04-01 NOTE — ED Notes (Signed)
Patient verbalizes understanding of discharge instructions. Opportunity for questioning and answers were provided. Armband removed by staff, pt discharged from ED via wheelchair w/ family   

## 2018-04-01 NOTE — ED Notes (Signed)
Repeat CBG 87-given crackers and peanut butter.

## 2018-04-01 NOTE — ED Notes (Addendum)
8 OZ orange juice given s/p stroke swallow screen for CBG of 66. MD Pfeiffer aware. Pt drinking without difficulty w/ assistance of caregiver. No resp or swallowing distress noted.

## 2018-04-01 NOTE — ED Provider Notes (Signed)
[facility] EM[facility]GENC[facility] DEPARTMENT Provider Note   CSN: [id number] Arrival date & time: 04/01/18  1035     History   Chief Complaint Chief Complaint  Patient presents with  . Weakness    HPI [name] is a 82 y.o. female.  HPI   Patient is an 82 year old female with history of dementia, type 2 diabetes, DVT (on Xarelto), hyperlipidemia, hypertension, CVA who presents to the emergency department with her family today for evaluation of left-sided weakness.  Pts daughter in law is at bedside and assists with history. She states that yesterday she noticed the patient was courteously turning her head to the left and favoring the right side. Today the patients nurse aid felt that the patient was not acting at her baseline and thought she seemed for weak on the left side while she was getting her bath this AM.   Daughter at bedside states that the patient lives with her in their home.  She has home health aides who help her with all of her ADLs.  At baseline she is oriented to self only.  She can answer some simple questions.  She has not had any of her blood pressure medications today. They denies any recent falls or trauma.  Past Medical History:  Diagnosis Date  . Dementia ([place])   . Diabetes mellitus type II 03/21/2011  . DVT (deep venous thrombosis) ([place]) 03/21/2011  . Hyperlipidemia 03/21/2011  . Hypertension 03/21/2011  . Lymphedema of leg 03/21/2011  . Pulmonary embolism ([place]) 03/21/2011  . Stroke [facility])     Patient Active Problem List   Diagnosis Date Noted  . Skin ulcer of sacrum with fat layer exposed ([place]) 07/22/2017  . Lipoma of left lower extremity 07/22/2017  . Estrogen deficiency 07/22/2017  . Hyperlipidemia associated with type 2 diabetes mellitus ([place]) 07/22/2017  . History of TIA (transient ischemic attack) 01/10/2017  . Atrial septal aneurysm 11/22/2016  . Bilateral lower extremity edema 11/22/2016  . Iron deficiency anemia due to  chronic blood loss 11/16/2016  . TIA (transient ischemic attack) 10/05/2016  . Lower urinary tract infectious disease   . Current use of long term anticoagulation 08/24/2016  . Metabolic encephalopathy 26/83/4196  . Senile dementia without behavioral disturbance ([place]) 08/23/2016  . Sepsis ([place]) 08/23/2016  . Acute CVA (cerebrovascular accident) ([place])   . Left-sided weakness 05/11/2016  . Dysarthria 05/11/2016  . Acute left-sided weakness 05/11/2016  . History of pulmonary embolism 03/21/2011  . History of DVT (deep vein thrombosis) 03/21/2011  . Type 2 diabetes mellitus with diabetic polyneuropathy, without long-term current use of insulin ([place]) 03/21/2011  . Lymphedema of leg 03/21/2011  . Hyperlipidemia 03/21/2011  . Hypertension 03/21/2011    Past Surgical History:  Procedure Laterality Date  . CHOLECYSTECTOMY  2015  . SKIN TAG REMOVAL  07/25/2016  . VAGINAL HYSTERECTOMY     unknown of date  . VASCULAR SURGERY       OB History    Gravida  3   Para  3   Term  3   Preterm      AB      Living  3     SAB      TAB      Ectopic      Multiple      Live Births  3            Home Medications    Prior to Admission medications   Medication Sig Start Date End Date  Taking? Authorizing Provider  acetaminophen (TYLENOL) 500 MG tablet Take 500 mg by mouth every 6 (six) hours as needed (pain).    Yes [provider]  atorvastatin (LIPITOR) 20 MG tablet Take 1 tablet (20 mg total) by mouth daily at 6 PM. Patient taking differently: Take 20 mg by mouth daily after supper.  07/06/17  Yes Gildardo Cranker, DO  Cholecalciferol (VITAMIN D-3) 125 MCG (5000 UT) TABS Take 5,000 Units by mouth daily.   Yes [provider]  cloNIDine (CATAPRES) 0.1 MG tablet Take 1 tablet (0.1 mg total) by mouth 2 (two) times daily. May give additional 1 tablet daily if SBP >160 Patient taking differently: Take 0.1 mg by mouth See admin instructions. Take one tablet (0.1 mg)  by mouth twice daily, May take  additional 1 tablet mid-day if SBP >160 07/06/17  Yes Eulas Post, Monica, DO  diclofenac sodium (VOLTAREN) 1 % GEL Apply 1 application topically 2 (two) times daily.    Yes [provider]  gabapentin (NEURONTIN) 300 MG capsule Take 1 capsule (300 mg total) by mouth 3 (three) times daily. Patient taking differently: Take 300 mg by mouth 2 (two) times daily.  10/18/17  Yes Eulas Post, Monica, DO  losartan (COZAAR) 25 MG tablet Take 1 tablet (25 mg total) by mouth daily. Patient taking differently: Take 25 mg by mouth See admin instructions. Take one tablet (25 mg) by mouth before or after lunch if SBP <160 (if >160 take clonidine instead of losartan) 01/17/18  Yes Gildardo Cranker, DO  metFORMIN (GLUCOPHAGE) 1000 MG tablet Take 1 tablet (1,000 mg total) by mouth 2 (two) times daily with a meal. 10/18/17  Yes Gildardo Cranker, DO  Rivaroxaban (XARELTO) 15 MG TABS tablet Take 1 tablet (15 mg total) by mouth daily with supper. 01/17/18  Yes Eulas Post, Monica, DO  ACCU-CHEK SOFTCLIX LANCETS lancets 1 each by Other route daily. Use as instructed Dx: E11.9 10/18/17   Gildardo Cranker, DO  glucose blood (ACCU-CHEK AVIVA PLUS) test strip 1 each by Other route 2 (two) times daily. E11.9 10/18/17   Gildardo Cranker, DO  polyethylene glycol [facility]) packet Take 17 g by mouth daily. Patient not taking: Reported on 04/01/2018 08/07/17   Long, Wonda Olds, MD    Family History Family History  Problem Relation Age of Onset  . Heart attack Mother   . COPD Sister   . Stroke Sister   . Bipolar disorder Daughter     Social History Social History   Tobacco Use  . Smoking status: Never Smoker  . Smokeless tobacco: Never Used  Substance Use Topics  . Alcohol use: No  . Drug use: No     Allergies   Patient has no known allergies.   Review of Systems Review of Systems  Unable to perform ROS: Dementia     Physical Exam Updated Vital Signs BP (!) 164/72 (BP Location: Right Arm)    Pulse 71   Temp 98.7 F (37.1 C) (Oral)   Resp 16   SpO2 98%   Physical Exam  Constitutional: She appears well-developed and well-nourished. No distress.  HENT:  Head: Normocephalic and atraumatic.  Eyes: Conjunctivae are normal.  Neck: Neck supple.  Cardiovascular: Normal rate, regular rhythm and normal heart sounds.  No murmur heard. Pulmonary/Chest: Effort normal and breath sounds normal. No respiratory distress. She has no wheezes.  Abdominal: Soft. Bowel sounds are normal. She exhibits no distension. There is no tenderness. There is no guarding.  Musculoskeletal:  LLE lymphedema (chronic, baseline).  Mild TTP with movement of the left wrist, though no significant bony tenderness present and no deformity noted. No erythema, warmth, or swelling,  Neurological: She is alert.  Oriented to self.  Answers some questions appropriately. Questionable left facial droop. Assessment limited due to inability of patient to follow all commands. Decreased strength on the LUE. Unable to assess sensation or bilateral lower extremities.   Skin: Skin is warm and dry.  Psychiatric: She has a normal mood and affect.  Nursing note and vitals reviewed.    ED Treatments / Results  Labs (all labs ordered are listed, but only abnormal results are displayed) Labs Reviewed  URINALYSIS, ROUTINE W REFLEX MICROSCOPIC - Abnormal; Notable for the following components:      Result Value   Hgb urine dipstick SMALL (*)    Ketones, ur 5 (*)    Protein, ur 100 (*)    Bacteria, UA RARE (*)    All other components within normal limits  CBC WITH DIFFERENTIAL/PLATELET - Abnormal; Notable for the following components:   Hemoglobin 11.9 (*)    Platelets 145 (*)    All other components within normal limits  COMPREHENSIVE METABOLIC PANEL - Abnormal; Notable for the following components:   Glucose, Bld 117 (*)    All other components within normal limits  CBG MONITORING, ED - Abnormal; Notable for the following  components:   Glucose-Capillary 66 (*)    All other components within normal limits  CBG MONITORING, ED - Abnormal; Notable for the following components:   Glucose-Capillary 66 (*)    All other components within normal limits  I-STAT CHEM 8, ED - Abnormal; Notable for the following components:   Potassium 7.6 (*)    Creatinine, Ser 0.30 (*)    Calcium, Ion 0.93 (*)    All other components within normal limits  I-STAT CHEM 8, ED - Abnormal; Notable for the following components:   Creatinine, Ser 0.30 (*)    Glucose, Bld 118 (*)    Calcium, Ion 1.06 (*)    All other components within normal limits  CBG MONITORING, ED - Abnormal; Notable for the following components:   Glucose-Capillary 114 (*)    All other components within normal limits  URINE CULTURE  RAPID URINE DRUG SCREEN, HOSP PERFORMED  I-STAT TROPONIN, ED  CBG MONITORING, ED    EKG EKG Interpretation  Date/Time:  Sunday April 01 2018 10:49:19 EST Ventricular Rate:  76 PR Interval:    QRS Duration: 80 QT Interval:  372 QTC Calculation: 419 R Axis:   65 Text Interpretation:  SR Probable anteroseptal infarct, old Minimal ST depression Baseline wander in lead(s) V2 V6 no sig change from previous Confirmed by Charlesetta Shanks [id number]) on 04/01/2018 3:26:02 PM   Radiology No results found.  Procedures Procedures (including critical care time)  Medications Ordered in ED Medications  labetalol (NORMODYNE,TRANDATE) injection 10 mg (10 mg Intravenous Given 04/01/18 1234)     Initial Impression / Assessment and Plan / ED Course  I have reviewed the triage vital signs and the nursing notes.  Pertinent labs & imaging results that were available during my care of the patient were reviewed by me and considered in my medical decision making (see chart for details).    Final Clinical Impressions(s) / ED Diagnoses   Final diagnoses:  Weakness   Patient presenting to the ED today for evaluation of left-sided weakness  that was noted by patient's CNA earlier today.  Last known normal was yesterday, though exact  timing is unknown.  Patient is demented and receives total care at baseline.  She is oriented to self only at baseline.  She was somewhat sedated on arriving to the ED and blood sugar was noted to be 66.  She passed her stroke swallow screen is therefore given multiple cups of orange juice which improved her sugars and also her mental status.  On exam she does have some left-sided weakness on the upper extremity and questionable left side facial droop. Exam is limited secondary to pt not being able to consistently follow commands.   On arrival patient's blood sugar was 66.  She was given orange juice and blood sugars improved.  His blood sugars improved patient became more alert and interactive.  Initial i-STAT Chem-8 was hemolyzed.  CMP grossly normal.  CBC with mild anemia and mild thrombocytopenia otherwise normal.  UA with no leukocytes or nitrites.  Hematuria, ketonuria and proteinuria present.  Urine culture sent. Troponin negative.  EKG with NSR without sig change from previous.  MRI of the brain showed advanced chronic small vessel disease with atrophy and ventriculomegaly however there was no obvious acute finding.  Patient appears to be back to baseline after her blood sugar improved in the ED.  Her family is at bedside and are comfortable with having the patient discharged home to follow-up with her PCP.  Discussed that she may need to have her insulin regimen changed and that they should be monitoring her blood sugars more closely at home to avoid hypoglycemia.  They voiced an understanding of the plan and reasons to return.  All questions answered.  ED Discharge Orders    None       Bishop Dublin 04/03/18 2321    Charlesetta Shanks, MD 04/04/18 [phone]

## 2018-04-01 NOTE — ED Triage Notes (Signed)
Pt presents for evaluation of significant weakness since yesterday afternoon. Pt has dementia at baseline and is total care with home health.

## 2018-04-01 NOTE — ED Notes (Addendum)
Patient's daughter at bedside states that she noticed patient's face looked somewhat drawn on Friday, yesterday patient's head was turned to the right all day, she reports she noticed patient's speech seemed slurred this am upon patient waking up. Patient has dementia, alert to person only at baseline. Daughter reports patient's speech seems more clear than earlier this am. Left sided facial droop noted on arrival to exam room. R hand weakness, pt unable to lift either arm-total care at baseline. CBG 66.   Dr Johnney Killian made aware of patient's symptoms.

## 2018-04-01 NOTE — Discharge Instructions (Addendum)
Please schedule an appointment with your regular doctor in the next 3 days for re-evaluation.  Please return to the emergency department for any new or worsening symptoms.

## 2018-04-02 ENCOUNTER — Other Ambulatory Visit: Payer: Medicare Other | Admitting: *Deleted

## 2018-04-02 DIAGNOSIS — Z515 Encounter for palliative care: Secondary | ICD-10-CM

## 2018-04-02 LAB — URINE CULTURE: CULTURE: NO GROWTH

## 2018-04-03 LAB — I-STAT CHEM 8, ED
BUN: 21 mg/dL (ref 8–23)
Calcium, Ion: 0.93 mmol/L — ABNORMAL LOW (ref 1.15–1.40)
Chloride: 106 mmol/L (ref 98–111)
Creatinine, Ser: 0.3 mg/dL — ABNORMAL LOW (ref 0.44–1.00)
Glucose, Bld: 93 mg/dL (ref 70–99)
HEMATOCRIT: 38 % (ref 36.0–46.0)
Hemoglobin: 12.9 g/dL (ref 12.0–15.0)
Potassium: 7.6 mmol/L (ref 3.5–5.1)
Sodium: 137 mmol/L (ref 135–145)
TCO2: 30 mmol/L (ref 22–32)

## 2018-04-04 ENCOUNTER — Telehealth: Payer: Self-pay

## 2018-04-04 NOTE — Telephone Encounter (Signed)
-----   Message from [name], NP sent at 04/03/2018  4:24 PM EST ----- Regarding: FW: New Stage 2 wound Contact: [phone] Can we send an order to [facility] for home health nursing and set up a follow up appt with her next week.  Thank you  ----- Message ----- From: [name], RN Sent: 04/03/2018   3:00 PM EST To: [name], NP Subject: RE: New Stage 2 wound                          Yes. I feel that it would work well to have a home health RN to manage the wound. She used [facility] home health in the past and had a good experience. They were also able to order the supplies for her as well. ----- Message ----- From: [name], NP Sent: 04/03/2018   9:07 AM EST To: [name], RN Subject: RE: New Stage 2 wound                          Yes this would be beneficial sounds like she has a lot going on.  Do we need to place a referral to wound care for this new stage 2 on her sacrum?   ----- Message ----- From: [name], RN Sent: 04/03/2018   8:58 AM EST To: [name], NP Subject: New Stage 2 wound                              Good morning Janett Billow, I went by to assess Mrs. Gavel's new Stage 2 wound to her sacrum. It is 1cm x 2cm in diameter. It is red in the center with surrounding white, non blanchable skin. It is draining serosanguinous fluid. She is in need of supplies so the daughter in law is requesting a script for Hydrocellular foam dressings 4x4, 4x4 gauze, Saline wound cleanser, skin prep barrier wipes and calcium alginate 4x4 3/4. She was also seen in the ED on 04/01/18 for hypoglycemia. Her CBG on my visit yesterday morning was 130 fasting. The family is going to start checking it regularly before breakfast and dinner for a while. Sometimes they give her a whole Metformin tablet and at other times 1/2. Does the daughter in law need to schedule a follow up appointment with you?  Thanks so much, Daryl Eastern RN  (Palliative Care)

## 2018-04-04 NOTE — Telephone Encounter (Signed)
Patient is scheduled for an appointment on 04/10/18.

## 2018-04-04 NOTE — Progress Notes (Signed)
COMMUNITY PALLIATIVE CARE RN NOTE  PATIENT NAME: [name] DOB: 01-31-1936 MRN: [id number]  PRIMARY CARE PROVIDER: [name], NP  RESPONSIBLE PARTY:  Acct ID - Guarantor Home Phone Work Phone Relationship Acct Type  [id number] JOSIE, [phone]  Self P/F     [address], [place], [place] [address]    PLAN OF CARE and INTERVENTION:  1. ADVANCE CARE PLANNING/GOALS OF CARE: Remain at home with her son and daughter in law 2. PATIENT/CAREGIVER EDUCATION: Reinforced Safe Tranfers, Skin Breakdown Prevention and Wound Care 3. DISEASE STATUS: Met with patient in her home. Patient lying in bed awake. Able to answer simple questions with short replies. At times is slow to respond. Patient denies pain, but grimacing and some moaning noted when daughter in law lifter her L arm at her L wrist. Wrist is swollen. Patient has a hx of gout. Daughter states that she had to take patient to the ED yesterday morning for symptoms of increased lethargy and decreased intake. CBG was 66 in the ED. Orange juice, peanut butter given per Cg report to help raise her CBG. CT scan also obtained but there were no new findings. Patient was discharged to home. No medication changes, but states to follow up with PCP in 1 week. Patient also has a new Stage 2 sacral wound noted. Area is 1cm x 2 cm in diameter which is white and non blanchable. Center or wound is red with small amount of serosanguinous drainage. Daughter is requesting supplies from PCP, but PCP asked to have this RN come and assess wound first. Patient is completely immobile and incontinent of both bowel and bladder. Sent communication to PCP to advise of wound and supplies needed. Will continue to monitor.  HISTORY OF PRESENT ILLNESS:  This is a 82 yo female who resides in the home with her son and daughter in law. Palliative Care Team continues to follow patient. Will continue to visit monthly and PRN.   CODE STATUS: DNR ADVANCED DIRECTIVES:  Y MOST FORM: no PPS: 30%   PHYSICAL EXAM:   VITALS: Today's Vitals   04/02/18 0938  BP: (!) 159/100  Pulse: 77  Resp: 16  Temp: 98.7 F (37.1 C)  TempSrc: Temporal  SpO2: 97%  PainSc: 4   PainLoc: Wrist    LUNGS: clear to auscultation  CARDIAC: Cor RRR EXTREMITIES: 4+ edema noted to L lower extremity and 3+ in R lower extremity, Swelling also noted to L wrist SKIN: New Stage 2 sacral wound  NEURO: Alert and oriented x 2, intermittent confusion, generalized weakness, non ambulatory and total care   (Duration of visit and documentation 75 minutes)    Daryl Eastern, RN, BSN

## 2018-04-10 ENCOUNTER — Ambulatory Visit (INDEPENDENT_AMBULATORY_CARE_PROVIDER_SITE_OTHER): Payer: Medicare Other | Admitting: Nurse Practitioner

## 2018-04-10 ENCOUNTER — Encounter: Payer: Self-pay | Admitting: Nurse Practitioner

## 2018-04-10 VITALS — BP 134/80 | HR 75 | Temp 96.9°F

## 2018-04-10 DIAGNOSIS — I10 Essential (primary) hypertension: Secondary | ICD-10-CM

## 2018-04-10 DIAGNOSIS — M109 Gout, unspecified: Secondary | ICD-10-CM

## 2018-04-10 DIAGNOSIS — L98421 Non-pressure chronic ulcer of back limited to breakdown of skin: Secondary | ICD-10-CM

## 2018-04-10 DIAGNOSIS — E1142 Type 2 diabetes mellitus with diabetic polyneuropathy: Secondary | ICD-10-CM

## 2018-04-10 DIAGNOSIS — F0390 Unspecified dementia without behavioral disturbance: Secondary | ICD-10-CM

## 2018-04-10 MED ORDER — METFORMIN HCL 500 MG PO TABS: 500.0000 mg | ORAL_TABLET | Freq: Two times a day (BID) | ORAL | 1 refills | Status: DC

## 2018-04-10 MED ORDER — CLONIDINE HCL 0.1 MG PO TABS: 0.1000 mg | ORAL_TABLET | Freq: Every day | ORAL | 1 refills | Status: DC | PRN

## 2018-04-10 MED ORDER — LOSARTAN POTASSIUM 50 MG PO TABS: 50.0000 mg | ORAL_TABLET | Freq: Every day | ORAL | 1 refills | Status: DC

## 2018-04-10 NOTE — Patient Instructions (Addendum)
Increase losartan 50 mg by mouth daily  Continue to check blood pressure and record To notify office if blood pressure staying over 160/100  HOLD clonidine for now.   Increase protein in diet- to use supplement 1-2 times daily after meal.  Prop up right hand with pillows when possible to keep elevated (above level of heart is most ideal)  Home health ordered Continue to keep pressure off bottom.  Decrease metformin to 500 mg by mouth twice daily

## 2018-04-10 NOTE — Progress Notes (Signed)
Careteam: Patient Care Team: Carolyn Chandler, NP as PCP - General (Geriatric Medicine) [name], MD as Consulting Physician (Oncology) [name], [name], LCSW as Social Worker (Licensed Clinical Social Worker) [name], RN as Registered Nurse [facility] and [place])  Advanced Directive information    No Known Allergies  Chief Complaint  Patient presents with  . Follow-up    Pressure sore follow-up and right hand swelling   . Referral    Home health referral request, patient in need of air mattress   . Blood Pressure Check    Follow-up on B/P and possible medication adjustment      HPI: Patient is a 82 y.o. female seen in the office today due to new pressure ulcers.  Pt is followed by palliative care. The nurse went by to assess pt and noted that she had a new Stage 2 wound to her sacrum. It is 1cm x 2cm in diameter. During her assessment, also noted red in the center with surrounding white, non blanchable skin. It is draining serosanguinous fluid.  They were using supplies from previous pressure ulcer which has improved area significantly  They are requesting home health however for additional supplies.   Pt with hx of gout, right hand more swollen since 11/24 when she went to the ED and daughter questions gout, does not complain of pain, also with some OA. Has not taken anything routinely for gout, no recent uric acid level.  ED on 04/01/18 for hypoglycemia. Her CBG on my visit yesterday morning was 130 fasting. Daughter has been checking more often and fasting 80-90s she is currently taking metformin 500 mg by BID.   Did not bring blood pressure log but sometimes diastolic is elevated to 335.  Currently    Review of Systems:  Review of Systems  Unable to perform ROS: Dementia    Past Medical History:  Diagnosis Date  . Dementia ([place])   . Diabetes mellitus type II 03/21/2011  . DVT (deep venous thrombosis) ([place]) 03/21/2011  .  Hyperlipidemia 03/21/2011  . Hypertension 03/21/2011  . Lymphedema of leg 03/21/2011  . Pulmonary embolism ([place]) 03/21/2011  . Stroke [facility])    Past Surgical History:  Procedure Laterality Date  . CHOLECYSTECTOMY  2015  . SKIN TAG REMOVAL  07/25/2016  . VAGINAL HYSTERECTOMY     unknown of date  . VASCULAR SURGERY     Social History:   reports that she has never smoked. She has never used smokeless tobacco. She reports that she does not drink alcohol or use drugs.  Family History  Problem Relation Age of Onset  . Heart attack Mother   . COPD Sister   . Stroke Sister   . Bipolar disorder Daughter     Medications: Patient's Medications  New Prescriptions   No medications on file  Previous Medications   ACCU-CHEK SOFTCLIX LANCETS LANCETS    1 each by Other route daily. Use as instructed Dx: E11.9   ACETAMINOPHEN (TYLENOL) 500 MG TABLET    Take 500 mg by mouth every 6 (six) hours as needed (pain).    ATORVASTATIN (LIPITOR) 20 MG TABLET    Take 1 tablet (20 mg total) by mouth daily at 6 PM.   CHOLECALCIFEROL (VITAMIN D-3) 125 MCG (5000 UT) TABS    Take 5,000 Units by mouth daily.   CLONIDINE (CATAPRES) 0.1 MG TABLET    Take 1 tablet (0.1 mg total) by mouth 2 (two) times daily. May give additional  1 tablet daily if SBP >160   DICLOFENAC SODIUM (VOLTAREN) 1 % GEL    Apply 1 application topically 2 (two) times daily.    GABAPENTIN (NEURONTIN) 300 MG CAPSULE    Take 300 mg by mouth 2 (two) times daily.   GLUCOSE BLOOD (ACCU-CHEK AVIVA PLUS) TEST STRIP    1 each by Other route 2 (two) times daily. E11.9   LOSARTAN (COZAAR) 25 MG TABLET    Take one tablet (25 mg) by mouth before or after lunch if SBP <160 (if >160 take clonidine instead of losartan)   METFORMIN (GLUCOPHAGE) 1000 MG TABLET    Take 1 tablet (1,000 mg total) by mouth 2 (two) times daily with a meal.   POLYETHYLENE GLYCOL (MIRALAX / GLYCOLAX) PACKET    Take 17 g by mouth as needed.   RIVAROXABAN (XARELTO) 15 MG TABS TABLET     Take 1 tablet (15 mg total) by mouth daily with supper.  Modified Medications   No medications on file  Discontinued Medications   GABAPENTIN (NEURONTIN) 300 MG CAPSULE    Take 1 capsule (300 mg total) by mouth 3 (three) times daily.   LOSARTAN (COZAAR) 25 MG TABLET    Take 1 tablet (25 mg total) by mouth daily.   POLYETHYLENE GLYCOL (MIRALAX) PACKET    Take 17 g by mouth daily.     Physical Exam:  Vitals:   04/10/18 1107  BP: 134/80  Pulse: 75  Temp: (!) 96.9 F (36.1 C)  TempSrc: Axillary  SpO2: 97%   There is no height or weight on file to calculate BMI.  Physical Exam  Constitutional: She appears well-developed.  HENT:  Mouth/Throat: Oropharynx is clear and moist. No oropharyngeal exudate.  MMM; no oral thrush  Eyes: Pupils are equal, round, and reactive to light. No scleral icterus.  Neck: Neck supple. Carotid bruit is not present. No thyromegaly present.  Cardiovascular: Normal rate, regular rhythm and intact distal pulses. Exam reveals no gallop and no friction rub.  Murmur (1/6 SEM) heard. +2 pitting LE edema b/l. No calf TTP  Pulmonary/Chest: Effort normal and breath sounds normal. No stridor. No respiratory distress. She has no wheezes. She has no rales.  Abdominal: Soft. Normal appearance and bowel sounds are normal. She exhibits no distension and no mass. There is no hepatomegaly. There is no tenderness. There is no rigidity, no rebound and no guarding. No hernia.  Musculoskeletal: She exhibits edema (small and large joint).  Neurological: She is alert. She has normal reflexes.  Skin: Skin is warm and dry. No rash noted.  2 small healing areas on sacrum 2 x 1 cm and 1.5 cm x 2 cm- white from changes in skin but intact. No drainage.  Psychiatric: She has a normal mood and affect.    Labs reviewed: Basic Metabolic Panel: Recent Labs    08/07/17 2047 08/15/17 1307 04/01/18 1145 04/01/18 1230 04/01/18 1240  NA 138 146* 137 139 141  K 4.5 4.3 7.6* 3.6 3.7    CL 100* 103 106 101 104  CO2 25 32  --  24  --   GLUCOSE 132* 166* 93 117* 118*  BUN 21* 15 21 14 18   CREATININE 0.40* 0.90 0.30* 0.48 0.30*  CALCIUM 9.3 9.4  --  9.3  --    Liver Function Tests: Recent Labs    08/07/17 2047 08/15/17 1307 04/01/18 1230  AST 18 21 19   ALT 13* 23 14  ALKPHOS 69 64 54  BILITOT 0.5  0.5 0.9  PROT 7.5 7.1 7.1  ALBUMIN 3.9 3.6 3.6   No results for input(s): LIPASE, AMYLASE in the last 8760 hours. No results for input(s): AMMONIA in the last 8760 hours. CBC: Recent Labs    08/07/17 2047 08/15/17 1307 04/01/18 1145 04/01/18 1230 04/01/18 1240  WBC 6.9 4.9  --  6.6  --   NEUTROABS 5.4 2.5  --  4.4  --   HGB 12.4 12.6 12.9 11.9* 13.3  HCT 39.1 39.5 38.0 38.7 39.0  MCV 91.4 91.6  --  95.1  --   PLT 208 207  --  145*  --    Lipid Panel: Recent Labs    07/11/17 0950 10/18/17 1032  CHOL 154 150  HDL 44* 40*  LDLCALC 91 93  TRIG 92 78  CHOLHDL 3.5 3.8   TSH: No results for input(s): TSH in the last 8760 hours. A1C: Lab Results  Component Value Date   HGBA1C 6.9 (H) 10/18/2017     Assessment/Plan 1. Type 2 diabetes mellitus with diabetic polyneuropathy, without long-term current use of insulin (HCC) -blood sugars have improved on metformin 500 mg BID (daughter-in-law states they reduced dose), due to age and comorbities tight control is not necessary. To continue metformin 500 mg BID - Ambulatory referral to [place] - Hemoglobin A1c - metFORMIN (GLUCOPHAGE) 500 MG tablet; Take 1 tablet (500 mg total) by mouth 2 (two) times daily with a meal.  Dispense: 90 tablet; Refill: 1 - COMPLETE METABOLIC PANEL WITH GFR  2. Skin ulcer of sacrum, limited to breakdown of skin (HCC) Improved area, now stage 1, to continue to use barrier cream and have nursing continue to monitor.  -encouraged pressure reduction, frequent turning and to increase protein.  - Ambulatory referral to Home Health  3. Dementia without behavioral disturbance,  unspecified dementia type ([place]) -progressive decline, encouraged good nutrition and protein supplement. Has caregivers that help her around the clock. - Ambulatory referral to Home Health  4. Essential hypertension -will increase losartan to 50 mg daily and have them use catapres only if sbp greater than 170. To record and bring to follow up in 2 weeks.  - Ambulatory referral to [place] GFR - cloNIDine (CATAPRES) 0.1 MG tablet; Take 1 tablet (0.1 mg total) by mouth daily as needed. May give 1 tablet daily if SBP >170  Dispense: 270 tablet; Refill: 1 - losartan (COZAAR) 50 MG tablet; Take 1 tablet (50 mg total) by mouth daily.  Dispense: 90 tablet; Refill: 1  5. Gout of wrist, unspecified cause, unspecified chronicity, unspecified laterality - Ambulatory referral to [place] GFR - Uric acid  Next appt: 2 weeks for blood pressure  Emilynn Srinivasan K. [place], [facility] [phone]

## 2018-04-12 ENCOUNTER — Telehealth: Payer: Self-pay | Admitting: *Deleted

## 2018-04-12 DIAGNOSIS — Z86711 Personal history of pulmonary embolism: Secondary | ICD-10-CM | POA: Diagnosis not present

## 2018-04-12 DIAGNOSIS — Z86718 Personal history of other venous thrombosis and embolism: Secondary | ICD-10-CM | POA: Diagnosis not present

## 2018-04-12 DIAGNOSIS — Z8673 Personal history of transient ischemic attack (TIA), and cerebral infarction without residual deficits: Secondary | ICD-10-CM | POA: Diagnosis not present

## 2018-04-12 DIAGNOSIS — I10 Essential (primary) hypertension: Secondary | ICD-10-CM | POA: Diagnosis not present

## 2018-04-12 DIAGNOSIS — L89152 Pressure ulcer of sacral region, stage 2: Secondary | ICD-10-CM | POA: Diagnosis not present

## 2018-04-12 DIAGNOSIS — Z7984 Long term (current) use of oral hypoglycemic drugs: Secondary | ICD-10-CM | POA: Diagnosis not present

## 2018-04-12 DIAGNOSIS — M10031 Idiopathic gout, right wrist: Secondary | ICD-10-CM | POA: Diagnosis not present

## 2018-04-12 DIAGNOSIS — Z7901 Long term (current) use of anticoagulants: Secondary | ICD-10-CM | POA: Diagnosis not present

## 2018-04-12 DIAGNOSIS — E1142 Type 2 diabetes mellitus with diabetic polyneuropathy: Secondary | ICD-10-CM | POA: Diagnosis not present

## 2018-04-12 DIAGNOSIS — F0390 Unspecified dementia without behavioral disturbance: Secondary | ICD-10-CM | POA: Diagnosis not present

## 2018-04-12 DIAGNOSIS — E785 Hyperlipidemia, unspecified: Secondary | ICD-10-CM | POA: Diagnosis not present

## 2018-04-12 NOTE — Telephone Encounter (Signed)
Received fax from [place] [phone]. Claim to determine Insured's eligibility for benefits. To be filled out and faxed to [phone] Policy No: [id number] Claim No: [id number]   Placed in [place] folder for review.

## 2018-04-13 ENCOUNTER — Other Ambulatory Visit: Payer: Medicare Other

## 2018-04-13 DIAGNOSIS — Z029 Encounter for administrative examinations, unspecified: Secondary | ICD-10-CM

## 2018-04-13 DIAGNOSIS — M109 Gout, unspecified: Secondary | ICD-10-CM | POA: Diagnosis not present

## 2018-04-13 DIAGNOSIS — I10 Essential (primary) hypertension: Secondary | ICD-10-CM | POA: Diagnosis not present

## 2018-04-13 DIAGNOSIS — E1142 Type 2 diabetes mellitus with diabetic polyneuropathy: Secondary | ICD-10-CM | POA: Diagnosis not present

## 2018-04-14 LAB — COMPLETE METABOLIC PANEL WITH GFR
AG RATIO: 1.4 (calc) (ref 1.0–2.5)
ALT: 9 U/L (ref 6–29)
AST: 13 U/L (ref 10–35)
Albumin: 3.8 g/dL (ref 3.6–5.1)
Alkaline phosphatase (APISO): 61 U/L (ref 33–130)
BUN/Creatinine Ratio: 38 (calc) — ABNORMAL HIGH (ref 6–22)
BUN: 14 mg/dL (ref 7–25)
CALCIUM: 9.3 mg/dL (ref 8.6–10.4)
CO2: 28 mmol/L (ref 20–32)
Chloride: 99 mmol/L (ref 98–110)
Creat: 0.37 mg/dL — ABNORMAL LOW (ref 0.60–0.88)
GFR, EST NON AFRICAN AMERICAN: 101 mL/min/{1.73_m2} (ref >=60)
GFR, Est African American: 117 mL/min/{1.73_m2} (ref >=60)
GLOBULIN: 2.8 g/dL (ref 1.9–3.7)
Glucose, Bld: 87 mg/dL (ref 65–99)
Potassium: 3.7 mmol/L (ref 3.5–5.3)
Sodium: 139 mmol/L (ref 135–146)
TOTAL PROTEIN: 6.6 g/dL (ref 6.1–8.1)
Total Bilirubin: 0.5 mg/dL (ref 0.2–1.2)

## 2018-04-14 LAB — URIC ACID: Uric Acid, Serum: 3.6 mg/dL (ref 2.5–7.0)

## 2018-04-14 LAB — HEMOGLOBIN A1C
HEMOGLOBIN A1C: 6.1 %{Hb} — AB (ref <5.7)
Mean Plasma Glucose: 128 (calc)
eAG (mmol/L): 7.1 (calc)

## 2018-04-16 DIAGNOSIS — Z029 Encounter for administrative examinations, unspecified: Secondary | ICD-10-CM

## 2018-04-16 NOTE — Telephone Encounter (Signed)
Form Given back to me to fill out what I could on form. Filled out with last OV note.   Attached sticker note to Janett Billow asking if patient needs appointment for rest of form.   Spoke with Faythe Dingwall, Glass blower/designer, and there will be a charge of $25.00 to fill out.   Called patient's caregiver, [name] and [facility] to return call regarding receiving forms and the charge. Awaiting callback.   (form at Clinical Intake in yellow folder awaiting callback)

## 2018-04-16 NOTE — Telephone Encounter (Addendum)
Caregiver, [name] notified of Charge and agreed to the $25. Stated that this was the first time she has been charged before for these forms but agrees to it.   Please Call [name] [phone] once forms are ready and she will come by and pay.   Yellow Folder with attached charge sheet placed in [place] folder to review, fill out and sign.

## 2018-04-18 ENCOUNTER — Encounter: Payer: Self-pay | Admitting: Hematology & Oncology

## 2018-04-18 ENCOUNTER — Inpatient Hospital Stay (HOSPITAL_BASED_OUTPATIENT_CLINIC_OR_DEPARTMENT_OTHER): Admitting: Hematology & Oncology

## 2018-04-18 ENCOUNTER — Inpatient Hospital Stay: Attending: Hematology & Oncology

## 2018-04-18 ENCOUNTER — Other Ambulatory Visit: Payer: Self-pay

## 2018-04-18 VITALS — BP 176/74 | HR 69 | Temp 98.1°F | Resp 20

## 2018-04-18 DIAGNOSIS — Z8673 Personal history of transient ischemic attack (TIA), and cerebral infarction without residual deficits: Secondary | ICD-10-CM | POA: Diagnosis not present

## 2018-04-18 DIAGNOSIS — Z86718 Personal history of other venous thrombosis and embolism: Secondary | ICD-10-CM

## 2018-04-18 DIAGNOSIS — Z86711 Personal history of pulmonary embolism: Secondary | ICD-10-CM | POA: Insufficient documentation

## 2018-04-18 DIAGNOSIS — Z7901 Long term (current) use of anticoagulants: Secondary | ICD-10-CM | POA: Diagnosis not present

## 2018-04-18 DIAGNOSIS — D500 Iron deficiency anemia secondary to blood loss (chronic): Secondary | ICD-10-CM

## 2018-04-18 DIAGNOSIS — D509 Iron deficiency anemia, unspecified: Secondary | ICD-10-CM

## 2018-04-18 LAB — CMP (CANCER CENTER ONLY)
ALT: 9 U/L (ref 0–44)
AST: 11 U/L — AB (ref 15–41)
Albumin: 3.5 g/dL (ref 3.5–5.0)
Alkaline Phosphatase: 53 U/L (ref 38–126)
Anion gap: 6 (ref 5–15)
BUN: 16 mg/dL (ref 8–23)
CALCIUM: 9.1 mg/dL (ref 8.9–10.3)
CO2: 33 mmol/L — AB (ref 22–32)
CREATININE: 0.44 mg/dL (ref 0.44–1.00)
Chloride: 97 mmol/L — ABNORMAL LOW (ref 98–111)
GFR, Est AFR Am: >60 mL/min (ref >60)
GLUCOSE: 179 mg/dL — AB (ref 70–99)
POTASSIUM: 4 mmol/L (ref 3.5–5.1)
Sodium: 136 mmol/L (ref 135–145)
Total Bilirubin: 0.3 mg/dL (ref 0.3–1.2)
Total Protein: 6.4 g/dL — ABNORMAL LOW (ref 6.5–8.1)

## 2018-04-18 LAB — CBC WITH DIFFERENTIAL (CANCER CENTER ONLY)
ABS IMMATURE GRANULOCYTES: 0.01 10*3/uL (ref 0.00–0.07)
BASOS ABS: 0 10*3/uL (ref 0.0–0.1)
BASOS PCT: 1 %
EOS PCT: 1 %
Eosinophils Absolute: 0 10*3/uL (ref 0.0–0.5)
HCT: 34.6 % — ABNORMAL LOW (ref 36.0–46.0)
HEMOGLOBIN: 10.3 g/dL — AB (ref 12.0–15.0)
Immature Granulocytes: 0 %
LYMPHS PCT: 34 %
Lymphs Abs: 1.5 10*3/uL (ref 0.7–4.0)
MCH: 29.6 pg (ref 26.0–34.0)
MCHC: 29.8 g/dL — AB (ref 30.0–36.0)
MCV: 99.4 fL (ref 80.0–100.0)
Monocytes Absolute: 0.4 10*3/uL (ref 0.1–1.0)
Monocytes Relative: 8 %
NEUTROS ABS: 2.4 10*3/uL (ref 1.7–7.7)
NRBC: 0 % (ref 0.0–0.2)
Neutrophils Relative %: 56 %
Platelet Count: 256 10*3/uL (ref 150–400)
RBC: 3.48 MIL/uL — ABNORMAL LOW (ref 3.87–5.11)
RDW: 12.5 % (ref 11.5–15.5)
WBC Count: 4.3 10*3/uL (ref 4.0–10.5)

## 2018-04-18 LAB — RETICULOCYTES
Immature Retic Fract: 12.3 % (ref 2.3–15.9)
RBC.: 3.48 MIL/uL — AB (ref 3.87–5.11)
Retic Count, Absolute: 53.9 10*3/uL (ref 19.0–186.0)
Retic Ct Pct: 1.6 % (ref 0.4–3.1)

## 2018-04-18 NOTE — Telephone Encounter (Signed)
LMOM for [name] to return call.   Paperwork is completed with a couple of questions that need to be answered by [facility]. Awaiting call back.   Paperwork at [place].

## 2018-04-18 NOTE — Progress Notes (Signed)
Hematology and Oncology Follow Up Visit  [name] [id number] [birth year] 82 y.o. 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  We last saw her back in April of this past year.  She had a relatively uneventful summer.  She had to go to the emergency room on 24 November.  She is having some mental status changes.  It seems like she may have had some type of infection.  Her hemoglobin at that time was 11.3.  She is on Xarelto.  She is had no melena or bright red blood per rectum.  She had a decent Thanksgiving.  Looks like it sounds like she ate fairly well.  She has had no rashes.  She has chronic swelling in her legs.  Overall, her performance status is ECOG 3.  Medications:  Allergies as of 04/18/2018   No Known Allergies     Medication List        Accurate as of 04/18/18 12:58 PM. Always use your most recent med list.          ACCU-CHEK SOFTCLIX LANCETS lancets 1 each by Other route daily. Use as instructed Dx: E11.9   acetaminophen 500 MG tablet Commonly known as:  TYLENOL Take 500 mg by mouth every 6 (six) hours as needed (pain).   atorvastatin 20 MG tablet Commonly known as:  LIPITOR Take 1 tablet (20 mg total) by mouth daily at 6 PM.   cloNIDine 0.1 MG tablet Commonly known as:  CATAPRES Take 1 tablet (0.1 mg total) by mouth daily as needed. May give 1 tablet daily if SBP >170   diclofenac sodium 1 % Gel Commonly known as:  VOLTAREN Apply 1 application topically 2 (two) times daily.   gabapentin 300 MG capsule Commonly known as:  NEURONTIN Take 300 mg by mouth 2 (two) times daily.   glucose blood test strip 1 each by Other route 2 (two)  times daily. E11.9   losartan 50 MG tablet Commonly known as:  COZAAR Take 1 tablet (50 mg total) by mouth daily.   metFORMIN 500 MG tablet Commonly known as:  GLUCOPHAGE Take 1 tablet (500 mg total) by mouth 2 (two) times daily with a meal.   polyethylene glycol packet Commonly known as:  MIRALAX / GLYCOLAX Take 17 g by mouth as needed.   Rivaroxaban 15 MG Tabs tablet Commonly known as:  XARELTO Take 1 tablet (15 mg total) by mouth daily with supper.   Vitamin D-3 125 MCG (5000 UT) Tabs Take 5,000 Units by mouth daily.       Allergies: No Known Allergies  Past Medical History, Surgical history, Social history, and Family History were reviewed and updated.  Review of Systems: Review of Systems  Constitutional: Positive for malaise/fatigue.  HENT: Negative.   Eyes: Negative.   Respiratory: Positive for shortness of breath.   Cardiovascular: Positive for palpitations and leg swelling.  Gastrointestinal: Positive for abdominal pain, constipation and nausea.  Genitourinary: Positive for frequency.  Musculoskeletal: Positive for joint pain and myalgias.  Skin: Negative.   Neurological: Positive for dizziness and focal weakness.  Endo/Heme/Allergies: Negative.   Psychiatric/Behavioral: Negative.      Physical Exam:  oral temperature is 98.1 F (  36.7 C). Her blood pressure is 176/74 (abnormal) and her pulse is 69. Her respiration is 20 and oxygen saturation is 96%.   Wt Readings from Last 3 Encounters:  01/30/18 130 lb (59 kg)  08/07/17 130 lb (59 kg)  04/20/17 128 lb (58.1 kg)    Ocular: Sclerae unicteric, pupils equal, round and reactive to light Ear-nose-throat: Oropharynx clear, dentition fair Lymphatic: No cervical, supraclavicular or axillary adenopathy Lungs no rales or rhonchi, good excursion bilaterally Heart regular rate and rhythm, no murmur appreciated Abd soft, nontender, positive bowel sounds, no liver or spleen tip palpated on exam, no fluid wave    MSK no focal spinal tenderness, no joint edema Neuro: non-focal, well-oriented, appropriate affect Breasts: Deferred   Lab Results  Component Value Date   WBC 4.3 04/18/2018   HGB 10.3 (L) 04/18/2018   HCT 34.6 (L) 04/18/2018   MCV 99.4 04/18/2018   PLT 256 04/18/2018   Lab Results  Component Value Date   FERRITIN 97 08/15/2017   IRON 54 08/15/2017   TIBC 229 (L) 08/15/2017   UIBC 175 08/15/2017   IRONPCTSAT 24 08/15/2017   Lab Results  Component Value Date   RETICCTPCT 1.6 04/18/2018   RBC 3.48 (L) 04/18/2018   RBC 3.48 (L) 04/18/2018   No results found for: KPAFRELGTCHN, LAMBDASER, KAPLAMBRATIO No results found for: IGGSERUM, IGA, IGMSERUM No results found for: Odetta Pink, SPEI   Chemistry      Component Value Date/Time   NA 136 04/18/2018 1201   NA 142 02/28/2017 1059   K 4.0 04/18/2018 1201   K 3.8 02/28/2017 1059   CL 97 (L) 04/18/2018 1201   CL 102 02/28/2017 1059   CO2 33 (H) 04/18/2018 1201   CO2 30 02/28/2017 1059   BUN 16 04/18/2018 1201   BUN 16 02/28/2017 1059   CREATININE 0.44 04/18/2018 1201   CREATININE 0.37 (L) 04/13/2018 1106   GLU 140 03/30/2016      Component Value Date/Time   CALCIUM 9.1 04/18/2018 1201   CALCIUM 9.0 02/28/2017 1059   ALKPHOS 53 04/18/2018 1201   ALKPHOS 68 02/28/2017 1059   AST 11 (L) 04/18/2018 1201   ALT 9 04/18/2018 1201   ALT 14 02/28/2017 1059   BILITOT 0.3 04/18/2018 1201      Impression and Plan: [name] is a very pleasant 82 yo African American female with history of PE and lower extremity DVT.  We will see what her iron studies look like.  Her hemoglobin is a little bit lower.  Last iron studies back in April showed a ferritin of 97 with an iron saturation of 24%.  We probably need to get her back in about 2 months or so.  I just want to make sure that we are not vigilant in following her hemoglobin so that she does not drop too low.     [name], MD 12/11/201912:58 PM

## 2018-04-19 LAB — FERRITIN: FERRITIN: 113 ng/mL (ref 11–307)

## 2018-04-19 LAB — IRON AND TIBC
Iron: 66 ug/dL (ref 41–142)
Saturation Ratios: 36 % (ref 21–57)
TIBC: 186 ug/dL — AB (ref 236–444)
UIBC: 120 ug/dL (ref 120–384)

## 2018-04-19 NOTE — Telephone Encounter (Signed)
[name], daughter notified forms are ready.  Daughter paid 25.00 fee over phone to [place]. Forms faxed to [place]

## 2018-04-20 ENCOUNTER — Telehealth: Payer: Self-pay | Admitting: *Deleted

## 2018-04-20 DIAGNOSIS — L89152 Pressure ulcer of sacral region, stage 2: Secondary | ICD-10-CM | POA: Diagnosis not present

## 2018-04-20 DIAGNOSIS — M10031 Idiopathic gout, right wrist: Secondary | ICD-10-CM | POA: Diagnosis not present

## 2018-04-20 DIAGNOSIS — F0390 Unspecified dementia without behavioral disturbance: Secondary | ICD-10-CM | POA: Diagnosis not present

## 2018-04-20 DIAGNOSIS — I10 Essential (primary) hypertension: Secondary | ICD-10-CM | POA: Diagnosis not present

## 2018-04-20 DIAGNOSIS — E1142 Type 2 diabetes mellitus with diabetic polyneuropathy: Secondary | ICD-10-CM | POA: Diagnosis not present

## 2018-04-20 DIAGNOSIS — E785 Hyperlipidemia, unspecified: Secondary | ICD-10-CM | POA: Diagnosis not present

## 2018-04-20 NOTE — Telephone Encounter (Signed)
Oh that is elevated, lets have her restart the clonidine 0.1 mg by mouth twice daily at this time.

## 2018-04-20 NOTE — Telephone Encounter (Signed)
Called and spoke with [name]. She stated that she got to looking at patient's medications and she is not sure that the Caregiver has been giving her the Losartan 50mg . [name] is thinking patient is only getting 25mg . She is going to confirm the dosage with the Caregiver.

## 2018-04-20 NOTE — Telephone Encounter (Signed)
Patient daughter in law, [name] called and stated that at last OV you made some BP medication changes and since patient's BP has been staying more elevated. These readings are several hours afterward taking medications. Patient has no Complaints.Has an appointment on 04/25/18. Please Advise.   Blood Pressure: 04/17/18 192/122 pm 04/18/18 193/113 pm 04/19/18 194/126 pm

## 2018-04-20 NOTE — Telephone Encounter (Signed)
Okay if she is only getting cozaar 25 lets increase this to 50

## 2018-04-23 ENCOUNTER — Telehealth: Payer: Self-pay | Admitting: *Deleted

## 2018-04-23 NOTE — Telephone Encounter (Signed)
[facility] Notified.

## 2018-04-23 NOTE — Telephone Encounter (Signed)
Kasey with [facility] called and wanted a verbal order to apply a DuoDerm to patient's Sacral wound area. Please Advise.

## 2018-04-23 NOTE — Telephone Encounter (Signed)
Okay to apply duoderm and monitor

## 2018-04-24 ENCOUNTER — Other Ambulatory Visit: Payer: Medicare Other | Admitting: *Deleted

## 2018-04-24 DIAGNOSIS — Z515 Encounter for palliative care: Secondary | ICD-10-CM

## 2018-04-25 ENCOUNTER — Ambulatory Visit: Payer: Medicare Other | Admitting: Nurse Practitioner

## 2018-04-25 ENCOUNTER — Other Ambulatory Visit: Payer: Medicare Other

## 2018-04-25 DIAGNOSIS — F0390 Unspecified dementia without behavioral disturbance: Secondary | ICD-10-CM | POA: Diagnosis not present

## 2018-04-25 DIAGNOSIS — L89152 Pressure ulcer of sacral region, stage 2: Secondary | ICD-10-CM | POA: Diagnosis not present

## 2018-04-25 DIAGNOSIS — E1142 Type 2 diabetes mellitus with diabetic polyneuropathy: Secondary | ICD-10-CM | POA: Diagnosis not present

## 2018-04-25 DIAGNOSIS — I10 Essential (primary) hypertension: Secondary | ICD-10-CM | POA: Diagnosis not present

## 2018-04-25 DIAGNOSIS — E785 Hyperlipidemia, unspecified: Secondary | ICD-10-CM | POA: Diagnosis not present

## 2018-04-25 DIAGNOSIS — M10031 Idiopathic gout, right wrist: Secondary | ICD-10-CM | POA: Diagnosis not present

## 2018-04-25 NOTE — Progress Notes (Signed)
COMMUNITY PALLIATIVE CARE RN NOTE  PATIENT NAME: [name] DOB: Oct 06, 1935 MRN: [id number]  PRIMARY CARE PROVIDER: [name], NP  RESPONSIBLE PARTY:  Acct ID - Guarantor Home Phone Work Phone Relationship Acct Type  [id number] [name], [name]  Self P/F     [place], [place], [place] [address]    PLAN OF CARE and INTERVENTION:  1. ADVANCE CARE PLANNING/GOALS OF CARE: Remain in her home with son and daughter in law and avoid hospitalizations if possible 2. PATIENT/CAREGIVER EDUCATION: Reinforced Safe Transfers, Pain Management and Wound Care 3. DISEASE STATUS: Met with patient and daughter in law in their home. Patient is sitting up in her wheelchair at the dining table, awake and alert and just finished eating breakfast. She denies pain. She was recently seen by her PCP. Patient often has elevated BPs. Losartan was increased to 50 mg per day and Clonidine held. BPs remained elevated so patient is now back using the Losartan along with Clonidine to help regulate them. Her Metformin 500 mg is scheduled BID and is working well. No hypoglycemic episodes noted since last visit. She continues with a sacral wound and patient now has a home Doctor, general practice through [employer]. Home health RN to visit again tomorrow and wants to try a Duoderm to sacral wound. Wound has improved since last visit. They are also trying to help patient obtain an alternating pressure air mattress to help minimize skin breakdown. No swelling noted this visit in patient's L hand/wrist area. Continued edema in bilateral lower extremities. She remains total care with all ADLs. Incontinent of both bowel and bladder. Also requires feeding most of the time. Will continue to monitor.   HISTORY OF PRESENT ILLNESS: This is a 82 yo female who resides at home with her son and daughter in law. Palliative Care Team continues to follow patient. Next visit scheduled in 1 month.   CODE STATUS: DNR ADVANCED DIRECTIVES:  Y MOST FORM: no PPS: 30%   PHYSICAL EXAM:   VITALS: Today's Vitals   04/24/18 1142  BP: (!) 168/92  Pulse: 80  Resp: 16  Temp: (!) 97.3 F (36.3 C)  TempSrc: Temporal  SpO2: 98%  PainSc: 0-No pain    LUNGS: clear to auscultation  CARDIAC: Cor RRR EXTREMITIES: 3+ edema to bilateral lower extremities L>R SKIN: Sacral wound, scarring from old wounds to coccyx  NEURO: Alert and oriented to self, intermittent confusion, total care   (Duration of visit and documentation 45 minutes)    Daryl Eastern, RN, BSN

## 2018-04-26 ENCOUNTER — Other Ambulatory Visit: Payer: Medicare Other

## 2018-04-26 DIAGNOSIS — M10031 Idiopathic gout, right wrist: Secondary | ICD-10-CM | POA: Diagnosis not present

## 2018-04-26 DIAGNOSIS — E785 Hyperlipidemia, unspecified: Secondary | ICD-10-CM | POA: Diagnosis not present

## 2018-04-26 DIAGNOSIS — F0390 Unspecified dementia without behavioral disturbance: Secondary | ICD-10-CM | POA: Diagnosis not present

## 2018-04-26 DIAGNOSIS — L89152 Pressure ulcer of sacral region, stage 2: Secondary | ICD-10-CM | POA: Diagnosis not present

## 2018-04-26 DIAGNOSIS — E1142 Type 2 diabetes mellitus with diabetic polyneuropathy: Secondary | ICD-10-CM | POA: Diagnosis not present

## 2018-04-26 DIAGNOSIS — I10 Essential (primary) hypertension: Secondary | ICD-10-CM | POA: Diagnosis not present

## 2018-04-30 ENCOUNTER — Ambulatory Visit: Payer: Medicare Other | Admitting: Nurse Practitioner

## 2018-05-03 DIAGNOSIS — E1142 Type 2 diabetes mellitus with diabetic polyneuropathy: Secondary | ICD-10-CM | POA: Diagnosis not present

## 2018-05-03 DIAGNOSIS — F0390 Unspecified dementia without behavioral disturbance: Secondary | ICD-10-CM | POA: Diagnosis not present

## 2018-05-03 DIAGNOSIS — E785 Hyperlipidemia, unspecified: Secondary | ICD-10-CM | POA: Diagnosis not present

## 2018-05-03 DIAGNOSIS — I10 Essential (primary) hypertension: Secondary | ICD-10-CM | POA: Diagnosis not present

## 2018-05-03 DIAGNOSIS — M10031 Idiopathic gout, right wrist: Secondary | ICD-10-CM | POA: Diagnosis not present

## 2018-05-03 DIAGNOSIS — L89152 Pressure ulcer of sacral region, stage 2: Secondary | ICD-10-CM | POA: Diagnosis not present

## 2018-05-04 ENCOUNTER — Encounter: Payer: Self-pay | Admitting: Nurse Practitioner

## 2018-05-04 ENCOUNTER — Ambulatory Visit (INDEPENDENT_AMBULATORY_CARE_PROVIDER_SITE_OTHER): Payer: Medicare Other | Admitting: Nurse Practitioner

## 2018-05-04 VITALS — BP 148/70 | HR 83 | Temp 98.4°F | Ht 67.0 in

## 2018-05-04 DIAGNOSIS — I10 Essential (primary) hypertension: Secondary | ICD-10-CM | POA: Diagnosis not present

## 2018-05-04 DIAGNOSIS — L98421 Non-pressure chronic ulcer of back limited to breakdown of skin: Secondary | ICD-10-CM | POA: Diagnosis not present

## 2018-05-04 DIAGNOSIS — E1142 Type 2 diabetes mellitus with diabetic polyneuropathy: Secondary | ICD-10-CM

## 2018-05-04 DIAGNOSIS — E782 Mixed hyperlipidemia: Secondary | ICD-10-CM | POA: Diagnosis not present

## 2018-05-04 MED ORDER — METFORMIN HCL 500 MG PO TABS: ORAL_TABLET | ORAL | 1 refills | Status: DC

## 2018-05-04 MED ORDER — UNABLE TO FIND: 3 refills | Status: DC

## 2018-05-04 NOTE — Progress Notes (Signed)
Careteam: Patient Care Team: [name], NP as PCP - General (Geriatric Medicine) [name], MD as Consulting Physician (Oncology) [name], [name], LCSW as Social Worker (Licensed Clinical Social Worker) [name], RN as Registered Nurse [facility] [place])  Advanced Directive information Does Patient Have a Medical Advance Directive?: Yes, Type of Advance Directive: Healthcare Power of Attorney  No Known Allergies  Chief Complaint  Patient presents with  . Medical Management of Chronic Issues    2 weeks follow-up     HPI: Patient is a 83 y.o. female seen in the office today to follow up on blood pressure.  htn- daughter gave losartan 50 mg however blood pressure remained elevated, then some how she got losartan 25 mg for a few days.  Now she is getting clonidine 0.1 mg daily and then rechecking blood pressure mid day if blood pressure if over 160 she gets clonidine, if lower she gets losartan 25 mg.  If blood pressure gets to be "normal" then she is lethargic.   Diabetes- Hgb 6.1,  Taking metformin 500 mg BID, however daughter is holding metformin in the morning if cbg in the 80-90. Sometimes at night it remains good without metformin so she will not give at night either.   Pt with hx of pressure ulcer that has healed well.   Review of Systems:  Review of Systems  Unable to perform ROS: Dementia    Past Medical History:  Diagnosis Date  . Dementia ([place])   . Diabetes mellitus type II 03/21/2011  . DVT (deep venous thrombosis) ([place]) 03/21/2011  . Hyperlipidemia 03/21/2011  . Hypertension 03/21/2011  . Lymphedema of leg 03/21/2011  . Pulmonary embolism ([place]) 03/21/2011  . Stroke [facility])    Past Surgical History:  Procedure Laterality Date  . CHOLECYSTECTOMY  2015  . SKIN TAG REMOVAL  07/25/2016  . VAGINAL HYSTERECTOMY     unknown of date  . VASCULAR SURGERY     Social History:   reports that she has never smoked. She has  never used smokeless tobacco. She reports that she does not drink alcohol or use drugs.  Family History  Problem Relation Age of Onset  . Heart attack Mother   . COPD Sister   . Stroke Sister   . Bipolar disorder Daughter     Medications: Patient's Medications  New Prescriptions   No medications on file  Previous Medications   ACCU-CHEK SOFTCLIX LANCETS LANCETS    1 each by Other route daily. Use as instructed Dx: E11.9   ACETAMINOPHEN (TYLENOL) 500 MG TABLET    Take 500 mg by mouth every 6 (six) hours as needed (pain).    ATORVASTATIN (LIPITOR) 20 MG TABLET    Take 1 tablet (20 mg total) by mouth daily at 6 PM.   CHOLECALCIFEROL (VITAMIN D-3) 125 MCG (5000 UT) TABS    Take 5,000 Units by mouth daily.   CLONIDINE (CATAPRES) 0.1 MG TABLET    Take 1 tablet (0.1 mg total) by mouth daily as needed. May give 1 tablet daily if SBP >170   DICLOFENAC SODIUM (VOLTAREN) 1 % GEL    Apply 1 application topically 2 (two) times daily.    GABAPENTIN (NEURONTIN) 300 MG CAPSULE    Take 300 mg by mouth 2 (two) times daily.   GLUCOSE BLOOD (ACCU-CHEK AVIVA PLUS) TEST STRIP    1 each by Other route 2 (two) times daily. E11.9   LOSARTAN (COZAAR) 50 MG TABLET  Take 1 tablet (50 mg total) by mouth daily.   METFORMIN (GLUCOPHAGE) 500 MG TABLET    Take 1 tablet (500 mg total) by mouth 2 (two) times daily with a meal.   POLYETHYLENE GLYCOL (MIRALAX / GLYCOLAX) PACKET    Take 17 g by mouth as needed.   RIVAROXABAN (XARELTO) 15 MG TABS TABLET    Take 1 tablet (15 mg total) by mouth daily with supper.  Modified Medications   No medications on file  Discontinued Medications   No medications on file     Physical Exam:  Vitals:   05/04/18 1306  BP: (!) 148/70  Pulse: 83  Temp: 98.4 F (36.9 C)  TempSrc: Oral  SpO2: 98%  Height: 5\' 7"  (1.702 m)   Body mass index is 20.36 kg/m.  Physical Exam Constitutional:      Appearance: Normal appearance. She is well-developed.  HENT:     Mouth/Throat:      Pharynx: No oropharyngeal exudate.  Eyes:     General: No scleral icterus.    Pupils: Pupils are equal, round, and reactive to light.  Neck:     Musculoskeletal: Neck supple.     Thyroid: No thyromegaly.     Vascular: No carotid bruit.  Cardiovascular:     Rate and Rhythm: Normal rate and regular rhythm.     Heart sounds: Murmur (1/6 SEM) present. No friction rub. No gallop.      Comments: +2 pitting LE edema b/l. No calf TTP Pulmonary:     Effort: Pulmonary effort is normal. No respiratory distress.     Breath sounds: Normal breath sounds. No stridor. No wheezing or rales.  Abdominal:     Palpations: Abdomen is not rigid. There is no hepatomegaly.  Skin:    General: Skin is warm and dry.     Findings: No rash.     Comments: 2 small healed areas on sacrum  Neurological:     Mental Status: She is alert.     Deep Tendon Reflexes: Reflexes are normal and symmetric.     Labs reviewed: Basic Metabolic Panel: Recent Labs    04/01/18 1230 04/01/18 1240 04/13/18 1106 04/18/18 1201  NA 139 141 139 136  K 3.6 3.7 3.7 4.0  CL 101 104 99 97*  CO2 24  --  28 33*  GLUCOSE 117* 118* 87 179*  BUN 14 18 14 16   CREATININE 0.48 0.30* 0.37* 0.44  CALCIUM 9.3  --  9.3 9.1   Liver Function Tests: Recent Labs    08/15/17 1307 04/01/18 1230 04/13/18 1106 04/18/18 1201  AST 21 19 13  11*  ALT 23 14 9 9   ALKPHOS 64 54  --  53  BILITOT 0.5 0.9 0.5 0.3  PROT 7.1 7.1 6.6 6.4*  ALBUMIN 3.6 3.6  --  3.5   No results for input(s): LIPASE, AMYLASE in the last 8760 hours. No results for input(s): AMMONIA in the last 8760 hours. CBC: Recent Labs    08/15/17 1307  04/01/18 1230 04/01/18 1240 04/18/18 1201  WBC 4.9  --  6.6  --  4.3  NEUTROABS 2.5  --  4.4  --  2.4  HGB 12.6   < > 11.9* 13.3 10.3*  HCT 39.5   < > 38.7 39.0 34.6*  MCV 91.6  --  95.1  --  99.4  PLT 207  --  145*  --  256   < > = values in this interval not displayed.   Lipid  Panel: Recent Labs    07/11/17 0950  10/18/17 1032  CHOL 154 150  HDL 44* 40*  LDLCALC 91 93  TRIG 92 78  CHOLHDL 3.5 3.8   TSH: No results for input(s): TSH in the last 8760 hours. A1C: Lab Results  Component Value Date   HGBA1C 6.1 (H) 04/13/2018     Assessment/Plan 1. Type 2 diabetes mellitus with diabetic polyneuropathy, without long-term current use of insulin (HCC) -A1c  6.1, risk for hypoglycemia, will reduce metformin at this time.  -metFORMIN (GLUCOPHAGE) 500 MG tablet; Daily at breakfast, hold for blood,  sugar less than 100  Dispense: 90 tablet; Refill: 1 - Hemoglobin A1c; Future  2. Skin ulcer of sacrum, limited to breakdown of skin ([place]) -improved, to use barrier cream or mepilex every 3 days or as needed   3. Mixed hyperlipidemia -continues on Lipitor  - Lipid Panel; Future - COMPLETE METABOLIC PANEL WITH GFR; Future  4. Hypertension -will resume previous dosing as this has been more effective for her. Losartan 50 mg daily did not bring blood pressure to goal.   Next appt: 3 months with lab work before visit.  Carlos American. Elliott, [facility] [phone]

## 2018-05-04 NOTE — Patient Instructions (Addendum)
Will reduce metformin to 500 mg by mouth daily  To use barrier cream to sacrum daily OR mepillex every 3 days  Follow up in 3 months, lab work prior to visit

## 2018-05-08 ENCOUNTER — Telehealth: Payer: Self-pay

## 2018-05-08 NOTE — Telephone Encounter (Signed)
That's fine, to change every 3 days and as needed

## 2018-05-08 NOTE — Telephone Encounter (Signed)
Message left on clinical intake voicemail:   [name] with [facility] called requesting to change dressing to Foam Border  Last OV 05/04/18, pending OV 07/31/2018  Please advise

## 2018-05-10 DIAGNOSIS — F0390 Unspecified dementia without behavioral disturbance: Secondary | ICD-10-CM | POA: Diagnosis not present

## 2018-05-10 DIAGNOSIS — M10031 Idiopathic gout, right wrist: Secondary | ICD-10-CM | POA: Diagnosis not present

## 2018-05-10 DIAGNOSIS — E785 Hyperlipidemia, unspecified: Secondary | ICD-10-CM | POA: Diagnosis not present

## 2018-05-10 DIAGNOSIS — E1142 Type 2 diabetes mellitus with diabetic polyneuropathy: Secondary | ICD-10-CM | POA: Diagnosis not present

## 2018-05-10 DIAGNOSIS — L89152 Pressure ulcer of sacral region, stage 2: Secondary | ICD-10-CM | POA: Diagnosis not present

## 2018-05-10 DIAGNOSIS — I10 Essential (primary) hypertension: Secondary | ICD-10-CM | POA: Diagnosis not present

## 2018-05-11 NOTE — Telephone Encounter (Signed)
Discussed [name]'s response with Myriam Jacobson

## 2018-05-15 ENCOUNTER — Emergency Department (HOSPITAL_COMMUNITY): Payer: Medicare Other

## 2018-05-15 ENCOUNTER — Other Ambulatory Visit: Payer: Self-pay

## 2018-05-15 ENCOUNTER — Encounter (HOSPITAL_COMMUNITY): Payer: Self-pay | Admitting: Emergency Medicine

## 2018-05-15 ENCOUNTER — Telehealth: Payer: Self-pay | Admitting: *Deleted

## 2018-05-15 ENCOUNTER — Emergency Department (HOSPITAL_COMMUNITY)
Admission: EM | Admit: 2018-05-15 | Discharge: 2018-05-15 | Disposition: A | Discharge status: Home / Self Care | Payer: Medicare Other | Attending: Emergency Medicine | Admitting: Emergency Medicine

## 2018-05-15 DIAGNOSIS — Z7901 Long term (current) use of anticoagulants: Secondary | ICD-10-CM | POA: Diagnosis not present

## 2018-05-15 DIAGNOSIS — E1165 Type 2 diabetes mellitus with hyperglycemia: Secondary | ICD-10-CM | POA: Diagnosis not present

## 2018-05-15 DIAGNOSIS — G9341 Metabolic encephalopathy: Secondary | ICD-10-CM | POA: Diagnosis not present

## 2018-05-15 DIAGNOSIS — E1169 Type 2 diabetes mellitus with other specified complication: Secondary | ICD-10-CM | POA: Insufficient documentation

## 2018-05-15 DIAGNOSIS — Z79899 Other long term (current) drug therapy: Secondary | ICD-10-CM | POA: Diagnosis not present

## 2018-05-15 DIAGNOSIS — G459 Transient cerebral ischemic attack, unspecified: Secondary | ICD-10-CM | POA: Diagnosis not present

## 2018-05-15 DIAGNOSIS — N3000 Acute cystitis without hematuria: Secondary | ICD-10-CM | POA: Diagnosis not present

## 2018-05-15 DIAGNOSIS — R4020 Unspecified coma: Secondary | ICD-10-CM | POA: Diagnosis not present

## 2018-05-15 DIAGNOSIS — Z86718 Personal history of other venous thrombosis and embolism: Secondary | ICD-10-CM | POA: Diagnosis not present

## 2018-05-15 DIAGNOSIS — E785 Hyperlipidemia, unspecified: Secondary | ICD-10-CM | POA: Diagnosis not present

## 2018-05-15 DIAGNOSIS — F0390 Unspecified dementia without behavioral disturbance: Secondary | ICD-10-CM | POA: Diagnosis not present

## 2018-05-15 DIAGNOSIS — Z86711 Personal history of pulmonary embolism: Secondary | ICD-10-CM | POA: Diagnosis not present

## 2018-05-15 DIAGNOSIS — I10 Essential (primary) hypertension: Secondary | ICD-10-CM | POA: Diagnosis not present

## 2018-05-15 DIAGNOSIS — Z7984 Long term (current) use of oral hypoglycemic drugs: Secondary | ICD-10-CM | POA: Diagnosis not present

## 2018-05-15 DIAGNOSIS — R4781 Slurred speech: Secondary | ICD-10-CM | POA: Diagnosis not present

## 2018-05-15 LAB — I-STAT CHEM 8, ED
BUN: 19 mg/dL (ref 8–23)
Calcium, Ion: 1.16 mmol/L (ref 1.15–1.40)
Chloride: 101 mmol/L (ref 98–111)
Creatinine, Ser: 0.4 mg/dL — ABNORMAL LOW (ref 0.44–1.00)
GLUCOSE: 227 mg/dL — AB (ref 70–99)
HEMATOCRIT: 35 % — AB (ref 36.0–46.0)
Hemoglobin: 11.9 g/dL — ABNORMAL LOW (ref 12.0–15.0)
Potassium: 4.1 mmol/L (ref 3.5–5.1)
Sodium: 137 mmol/L (ref 135–145)
TCO2: 28 mmol/L (ref 22–32)

## 2018-05-15 LAB — DIFFERENTIAL
Abs Immature Granulocytes: 0.01 10*3/uL (ref 0.00–0.07)
Basophils Absolute: 0 10*3/uL (ref 0.0–0.1)
Basophils Relative: 0 %
Eosinophils Absolute: 0 10*3/uL (ref 0.0–0.5)
Eosinophils Relative: 1 %
Immature Granulocytes: 0 %
LYMPHS PCT: 42 %
Lymphs Abs: 1.6 10*3/uL (ref 0.7–4.0)
Monocytes Absolute: 0.5 10*3/uL (ref 0.1–1.0)
Monocytes Relative: 13 %
Neutro Abs: 1.7 10*3/uL (ref 1.7–7.7)
Neutrophils Relative %: 44 %

## 2018-05-15 LAB — COMPREHENSIVE METABOLIC PANEL
ALT: 11 U/L (ref 0–44)
AST: 16 U/L (ref 15–41)
Albumin: 2.9 g/dL — ABNORMAL LOW (ref 3.5–5.0)
Alkaline Phosphatase: 49 U/L (ref 38–126)
Anion gap: 9 (ref 5–15)
BUN: 15 mg/dL (ref 8–23)
CO2: 25 mmol/L (ref 22–32)
Calcium: 9 mg/dL (ref 8.9–10.3)
Chloride: 102 mmol/L (ref 98–111)
Creatinine, Ser: 0.5 mg/dL (ref 0.44–1.00)
GFR calc Af Amer: >60 mL/min (ref >60)
GFR calc non Af Amer: >60 mL/min (ref >60)
Glucose, Bld: 225 mg/dL — ABNORMAL HIGH (ref 70–99)
Potassium: 4 mmol/L (ref 3.5–5.1)
Sodium: 136 mmol/L (ref 135–145)
Total Bilirubin: 0.6 mg/dL (ref 0.3–1.2)
Total Protein: 6.6 g/dL (ref 6.5–8.1)

## 2018-05-15 LAB — CBC
HCT: 34.8 % — ABNORMAL LOW (ref 36.0–46.0)
Hemoglobin: 10.5 g/dL — ABNORMAL LOW (ref 12.0–15.0)
MCH: 29.2 pg (ref 26.0–34.0)
MCHC: 30.2 g/dL (ref 30.0–36.0)
MCV: 96.7 fL (ref 80.0–100.0)
Platelets: 211 10*3/uL (ref 150–400)
RBC: 3.6 MIL/uL — ABNORMAL LOW (ref 3.87–5.11)
RDW: 12.5 % (ref 11.5–15.5)
WBC: 3.9 10*3/uL — ABNORMAL LOW (ref 4.0–10.5)
nRBC: 0 % (ref 0.0–0.2)

## 2018-05-15 LAB — APTT: aPTT: 32 seconds (ref 24–36)

## 2018-05-15 LAB — URINALYSIS, ROUTINE W REFLEX MICROSCOPIC
BILIRUBIN URINE: NEGATIVE
Glucose, UA: NEGATIVE mg/dL
KETONES UR: NEGATIVE mg/dL
Nitrite: NEGATIVE
Protein, ur: 30 mg/dL — AB
Specific Gravity, Urine: 1.023 (ref 1.005–1.030)
pH: 5 (ref 5.0–8.0)

## 2018-05-15 LAB — PROTIME-INR
INR: 1.16
Prothrombin Time: 14.7 seconds (ref 11.4–15.2)

## 2018-05-15 LAB — I-STAT TROPONIN, ED: Troponin i, poc: 0.01 ng/mL (ref 0.00–0.08)

## 2018-05-15 LAB — CBG MONITORING, ED: Glucose-Capillary: 212 mg/dL — ABNORMAL HIGH (ref 70–99)

## 2018-05-15 MED ORDER — CEPHALEXIN 250 MG/5ML PO SUSR: 500.0000 mg | Freq: Four times a day (QID) | ORAL | 0 refills | Status: AC

## 2018-05-15 MED ORDER — SODIUM CHLORIDE 0.9 % IV SOLN
1.0000 g | Freq: Once | INTRAVENOUS | Status: AC
  Administered 2018-05-15: 1 g via INTRAVENOUS
  Filled 2018-05-15: qty 10

## 2018-05-15 NOTE — ED Triage Notes (Signed)
Per [facility] pt coming from home called out for patient having an episode of unresponsiveness. Patient was sitting up in chair when EMS arrived. Patient is wheelchair bound with hx of HTN and DVT, currently on blood thinners. Patient has slurred speech and does not follow commands. Per caregiver at baseline.

## 2018-05-15 NOTE — ED Notes (Signed)
Patient verbalizes understanding of discharge instructions. Opportunity for questioning and answers were provided. Armband removed by staff, pt discharged from ED in wheelchair with daughter.   

## 2018-05-15 NOTE — Telephone Encounter (Signed)
Patient daughter in law, [name] called and stated that patient has been constipated for a little over a week now. No BM (last 12/28). No abdominal distention or pain. When she press on it it feels a little swollen but not bad.  Patient has been taking Miralax for the last 4-5 days with no relief. Also stated that she wonders if the Miralax can affect the BP because it has been running low also. Last night BP was 106/82 rechecked 127/86. This morning 155/80 (states that is low for her). Please Advise.     (Can LM on VM)

## 2018-05-15 NOTE — ED Notes (Signed)
Pt transported to xray 

## 2018-05-15 NOTE — Telephone Encounter (Signed)
miralax should not effect blood pressure.  She can do a fleets enema today and monitor, would continue to do the miralax daily for now Can make an appt for exam as well if she would like to be further evaluated

## 2018-05-15 NOTE — Consult Note (Signed)
Requesting Physician: Dr. Gilford Raid    Chief Complaint: AMS, found unresponsive by family, slurred speech   History obtained from: EMS and Chart    HPI:                                                                                                                                        [name] is an 83 y.o. female with past medical history of dementia, hypertension, hyperlipidemia, diabetes mellitus, DVT on Xarelto, stroke presents to the emergency department as a code stroke after being found unresponsive on the couch at home.  Per EMS patient was last seen normal at 3:30 PM.  Family found her unresponsive on the couch and called EMS.  When EMS arrived, patient was awake and answering questions but had significantly more slurred speech.  She is bedbound and incontinent baseline.  Date last known well: 1.7.20 Time last known well: 3:30 PM tPA Given: No,  patient is on Xarelto NIHSS:  Baseline MRS 4   Past Medical History:  Diagnosis Date  . Dementia ([place])   . Diabetes mellitus type II 03/21/2011  . DVT (deep venous thrombosis) ([place]) 03/21/2011  . Hyperlipidemia 03/21/2011  . Hypertension 03/21/2011  . Lymphedema of leg 03/21/2011  . Pulmonary embolism ([place]) 03/21/2011  . Stroke [facility])     Past Surgical History:  Procedure Laterality Date  . CHOLECYSTECTOMY  2015  . SKIN TAG REMOVAL  07/25/2016  . VAGINAL HYSTERECTOMY     unknown of date  . VASCULAR SURGERY      Family History  Problem Relation Age of Onset  . Heart attack Mother   . COPD Sister   . Stroke Sister   . Bipolar disorder Daughter    Social History:  reports that she has never smoked. She has never used smokeless tobacco. She reports that she does not drink alcohol or use drugs.  Allergies: No Known Allergies  Medications:                                                                                                                        I reviewed home medications   ROS:  Unable to obtain due to poor mental status   Examination:                                                                                                      General: Appears thin Psych: Affect appropriate to situation Eyes: No scleral injection HENT: No OP obstrucion Head: Normocephalic.  Cardiovascular: Normal rate and regular rhythm.  Respiratory: Effort normal and breath sounds normal to anterior ascultation GI: Soft.  No distension. There is no tenderness.  Skin: WDI    Neurological Examination Mental Status: Awake, oriented to her name only.  Not able to state each month.  Speech slightly dysarthric hoarse. Cranial Nerves: II: Visual fields: Blinks to third bilaterally III,IV, VI: ptosis not present, extra-ocular motions intact bilaterally, pupils equal, round, reactive to light and accommodation V,VII: smile symmetric Motor: Right : Upper extremity   4/5    Left:     Upper extremity   4/5  Lower extremity   2/5     Lower extremity   2/5 Tone and bulk:increased tone on left compared to right side  Sensory: withdraws to pain on both sides Plantars: Right: downgoing   Left: downgoing Cerebellar:  Unable to assess  Gait: unable to assess   Lab Results: Basic Metabolic Panel: No results for input(s): NA, K, CL, CO2, GLUCOSE, BUN, CREATININE, CALCIUM, MG, PHOS in the last 168 hours.  CBC: No results for input(s): WBC, NEUTROABS, HGB, HCT, MCV, PLT in the last 168 hours.  Coagulation Studies: No results for input(s): LABPROT, INR in the last 72 hours.  Imaging: No results found.   ASSESSMENT AND PLAN  83 y.o. female with past medical history of dementia, hypertension, hyperlipidemia, diabetes mellitus, DVT on Xarelto, stroke presents to the emergency department as a code stroke after being found unresponsive on the couch at home.  Increased slurring of her  speech.  Patient underwent a stat head CT no acute findings/hemorrhage.  Redemonstrated ventriculomegaly and advanced atrophy.  Patient is not a candidate for TPA due to being on Xarelto unclear also if this is a stroke.  Stroke code canceled   Acute embolic encephalopathy Episode of altered awareness  Differential diagnosis includes stroke, metabolic and infectious etiology seizure.   Recommendations MRI brain to r/o stroke  Check CBC/BMP/UA UDS/CXR No AED for now      Sushanth Aroor [facility] Pager Number [phone]

## 2018-05-15 NOTE — Telephone Encounter (Signed)
Daughter In [place] notified and agreed.

## 2018-05-15 NOTE — ED Provider Notes (Signed)
[facility] EMERGENCY DEPARTMENT Provider Note   CSN: [id number] Arrival date & time: 05/15/18  1636     History   Chief Complaint No chief complaint on file.   HPI [name] is a 83 y.o. female.  Pt presents to the ED today after an unresponsive episode at home.  When she woke up, her speech was slurred.  When EMS arrived, her speech was slurred, but she is moving all extremities.  She has severe dementia, so is unable to give any hx.     Past Medical History:  Diagnosis Date  . Dementia ([place])   . Diabetes mellitus type II 03/21/2011  . DVT (deep venous thrombosis) ([place]) 03/21/2011  . Hyperlipidemia 03/21/2011  . Hypertension 03/21/2011  . Lymphedema of leg 03/21/2011  . Pulmonary embolism ([place]) 03/21/2011  . Stroke [facility])     Patient Active Problem List   Diagnosis Date Noted  . Skin ulcer of sacrum with fat layer exposed ([place]) 07/22/2017  . Lipoma of left lower extremity 07/22/2017  . Estrogen deficiency 07/22/2017  . Hyperlipidemia associated with type 2 diabetes mellitus ([place]) 07/22/2017  . History of TIA (transient ischemic attack) 01/10/2017  . Atrial septal aneurysm 11/22/2016  . Bilateral lower extremity edema 11/22/2016  . Iron deficiency anemia due to chronic blood loss 11/16/2016  . TIA (transient ischemic attack) 10/05/2016  . Lower urinary tract infectious disease   . Current use of long term anticoagulation 08/24/2016  . Metabolic encephalopathy 19/50/9326  . Senile dementia without behavioral disturbance ([place]) 08/23/2016  . Sepsis ([place]) 08/23/2016  . Acute CVA (cerebrovascular accident) ([place])   . Left-sided weakness 05/11/2016  . Dysarthria 05/11/2016  . Acute left-sided weakness 05/11/2016  . History of pulmonary embolism 03/21/2011  . History of DVT (deep vein thrombosis) 03/21/2011  . Type 2 diabetes mellitus with diabetic polyneuropathy, without long-term current use of insulin ([place]) 03/21/2011  . Lymphedema of leg  03/21/2011  . Hyperlipidemia 03/21/2011  . Hypertension 03/21/2011    Past Surgical History:  Procedure Laterality Date  . CHOLECYSTECTOMY  2015  . SKIN TAG REMOVAL  07/25/2016  . VAGINAL HYSTERECTOMY     unknown of date  . VASCULAR SURGERY       OB History    Gravida  3   Para  3   Term  3   Preterm      AB      Living  3     SAB      TAB      Ectopic      Multiple      Live Births  3            Home Medications    Prior to Admission medications   Medication Sig Start Date End Date Taking? Authorizing Provider  acetaminophen (TYLENOL) 500 MG tablet Take 500 mg by mouth every 6 (six) hours as needed (pain).    Yes [provider]  atorvastatin (LIPITOR) 20 MG tablet Take 1 tablet (20 mg total) by mouth daily at 6 PM. 07/06/17  Yes Gildardo Cranker, DO  Cholecalciferol (VITAMIN D-3) 125 MCG (5000 UT) TABS Take 5,000 Units by mouth daily.   Yes [provider]  cloNIDine (CATAPRES) 0.1 MG tablet Take 1 tablet (0.1 mg total) by mouth daily as needed. May give 1 tablet daily if SBP >170 Patient taking differently: Take 0.1 mg by mouth 2 (two) times daily. May give 1 tablet daily if SBP >170 04/10/18  Yes  [name], NP  diclofenac sodium (VOLTAREN) 1 % GEL Apply 1 application topically 2 (two) times daily.    Yes [provider]  gabapentin (NEURONTIN) 300 MG capsule Take 300 mg by mouth 2 (two) times daily.   Yes [provider]  metFORMIN (GLUCOPHAGE) 500 MG tablet Daily at breakfast, hold for blood sugar less than 100 05/04/18  Yes Eubanks, Carlos American, NP  polyethylene glycol (MIRALAX / GLYCOLAX) packet Take 17 g by mouth as needed.   Yes [provider]  Rivaroxaban (XARELTO) 15 MG TABS tablet Take 1 tablet (15 mg total) by mouth daily with supper. 01/17/18  Yes Eulas Post, Monica, DO  ACCU-CHEK SOFTCLIX LANCETS lancets 1 each by Other route daily. Use as instructed Dx: E11.9 10/18/17   Gildardo Cranker, DO  cephALEXin  (KEFLEX) 250 MG/5ML suspension Take 10 mLs (500 mg total) by mouth 4 (four) times daily for 7 days. 05/15/18 05/22/18  Isla Pence, MD  glucose blood (ACCU-CHEK AVIVA PLUS) test strip 1 each by Other route 2 (two) times daily. E11.9 10/18/17   Gildardo Cranker, DO  losartan (COZAAR) 50 MG tablet Take 1 tablet (50 mg total) by mouth daily. Patient not taking: Reported on 05/15/2018 04/10/18   [name], NP  UNABLE TO FIND Mepilex: Apply to sacrum and change every 3 days or sooner as needed 05/04/18   [name], NP  Wound Dressings ([place]) Apply to sacrum and change every 3 days or sooner as needed    [provider]    Family History Family History  Problem Relation Age of Onset  . Heart attack Mother   . COPD Sister   . Stroke Sister   . Bipolar disorder Daughter     Social History Social History   Tobacco Use  . Smoking status: Never Smoker  . Smokeless tobacco: Never Used  Substance Use Topics  . Alcohol use: No  . Drug use: No     Allergies   Patient has no known allergies.   Review of Systems Review of Systems  Unable to perform ROS: Dementia     Physical Exam Updated Vital Signs BP (!) 201/91   Pulse 61   Resp 12   SpO2 100%   Physical Exam Vitals signs and nursing note reviewed.  Constitutional:      Appearance: Normal appearance. She is normal weight.  HENT:     Head: Normocephalic and atraumatic.     Right Ear: External ear normal.     Left Ear: External ear normal.     Nose: Nose normal.     Mouth/Throat:     Mouth: Mucous membranes are moist.     Pharynx: Oropharynx is clear.  Eyes:     Extraocular Movements: Extraocular movements intact.     Conjunctiva/sclera: Conjunctivae normal.     Pupils: Pupils are equal, round, and reactive to light.  Neck:     Musculoskeletal: Normal range of motion and neck supple.  Cardiovascular:     Rate and Rhythm: Normal rate and regular rhythm.     Pulses: Normal pulses.     Heart  sounds: Normal heart sounds.  Pulmonary:     Effort: Pulmonary effort is normal.     Breath sounds: Normal breath sounds.  Abdominal:     General: Abdomen is flat. Bowel sounds are normal.     Palpations: Abdomen is soft.  Musculoskeletal: Normal range of motion.  Skin:    General: Skin is warm and  dry.     Capillary Refill: Capillary refill takes less than 2 seconds.  Neurological:     General: No focal deficit present.     Mental Status: She is alert.  Psychiatric:        Mood and Affect: Mood normal.        Behavior: Behavior normal.        Thought Content: Thought content normal.        Judgment: Judgment normal.      ED Treatments / Results  Labs (all labs ordered are listed, but only abnormal results are displayed) Labs Reviewed  CBC - Abnormal; Notable for the following components:      Result Value   WBC 3.9 (*)    RBC 3.60 (*)    Hemoglobin 10.5 (*)    HCT 34.8 (*)    All other components within normal limits  COMPREHENSIVE METABOLIC PANEL - Abnormal; Notable for the following components:   Glucose, Bld 225 (*)    Albumin 2.9 (*)    All other components within normal limits  URINALYSIS, ROUTINE W REFLEX MICROSCOPIC - Abnormal; Notable for the following components:   APPearance HAZY (*)    Hgb urine dipstick SMALL (*)    Protein, ur 30 (*)    Leukocytes, UA MODERATE (*)    Bacteria, UA MANY (*)    All other components within normal limits  CBG MONITORING, ED - Abnormal; Notable for the following components:   Glucose-Capillary 212 (*)    All other components within normal limits  I-STAT CHEM 8, ED - Abnormal; Notable for the following components:   Creatinine, Ser 0.40 (*)    Glucose, Bld 227 (*)    Hemoglobin 11.9 (*)    HCT 35.0 (*)    All other components within normal limits  URINE CULTURE  PROTIME-INR  APTT  DIFFERENTIAL  I-STAT TROPONIN, ED  CBG MONITORING, ED    EKG None  Radiology Dg Chest 2 View  Result Date: 05/15/2018 CLINICAL  DATA:  Altered level of consciousness. Incoherent speech. EXAM: CHEST - 2 VIEW COMPARISON:  11/08/2016 FINDINGS: Stable cardiomegaly with aortic atherosclerosis. Minimal atelectasis and/or scarring is noted at the left lung base. No acute pulmonary consolidation or edema. No effusion or pneumothorax. No acute osseous abnormality. Degenerative changes are present along the included dorsal spine and both shoulders. IMPRESSION: Stable cardiomegaly with aortic atherosclerosis. No active pulmonary disease. Electronically Signed   By: Ashley Royalty M.D.   On: 05/15/2018 19:21   Ct Head Code Stroke Wo Contrast  Result Date: 05/15/2018 CLINICAL DATA:  Code stroke. 83 y/o F; slurred speech. History of dementia. EXAM: CT HEAD WITHOUT CONTRAST TECHNIQUE: Contiguous axial images were obtained from the base of the skull through the vertex without intravenous contrast. COMPARISON:  04/01/2018 MRI head.  10/04/2016 CT head. FINDINGS: Brain: No evidence of acute infarction, hemorrhage, hydrocephalus, extra-axial collection or mass lesion/mass effect. Chronic infarct within the left lateral and inferior cerebellar hemisphere and very small chronic infarction within the right occipital lobe. Advanced chronic microvascular ischemic changes of white matter and volume loss of the brain. Vascular: Calcific atherosclerosis of carotid siphons. No hyperdense vessel identified. Skull: Normal. Negative for fracture or focal lesion. Sinuses/Orbits: No acute finding. Other: Bilateral intra-ocular lens replacement. ASPECTS [facility] Stroke Program Early CT Score) - Ganglionic level infarction (caudate, lentiform nuclei, internal capsule, insula, M1-M3 cortex): 7 - Supraganglionic infarction (M4-M6 cortex): 3 Total score (0-10 with 10 being normal): 10 IMPRESSION: 1. No acute intracranial  abnormality identified. 2. Chronic infarct within the left lateral and inferior cerebellar hemisphere and very small chronic infarction within the right  occipital lobe. 3. Advanced chronic microvascular ischemic changes of white matter and volume loss of the brain. 4. ASPECTS is 10 These results were communicated to Dr. Lorraine Lax at 5:09 pmon 1/7/2020by text page via the [facility] messaging system. Electronically Signed   By: Kristine Garbe M.D.   On: 05/15/2018 17:11    Procedures Procedures (including critical care time)  Medications Ordered in ED Medications  cefTRIAXone (ROCEPHIN) 1 g in sodium chloride 0.9 % 100 mL IVPB (1 g Intravenous New Bag/Given 05/15/18 1900)     Initial Impression / Assessment and Plan / ED Course  I have reviewed the triage vital signs and the nursing notes.  Pertinent labs & imaging results that were available during my care of the patient were reviewed by me and considered in my medical decision making (see chart for details).  Code stroke initially called due to the slurred speech.  Neurology saw pt and do not feel like pt is having a stroke.  She is not a candidate for TPA as she's on Xarelto.  They cancelled the code stroke.  They did recommend a MRI.  However, pt's family is here now and feels like pt is at her baseline.  I don't think a MRI will be helpful.  She is a palliative care pt with a DNR.  Pt does have a UTI.  She is given a dose of rocephin in ED and will be d/c home on rocephin.  Final Clinical Impressions(s) / ED Diagnoses   Final diagnoses:  Acute cystitis without hematuria    ED Discharge Orders         Ordered    cephALEXin (KEFLEX) 250 MG/5ML suspension  4 times daily     05/15/18 1933           Isla Pence, MD 05/15/18 [phone]

## 2018-05-17 DIAGNOSIS — L89152 Pressure ulcer of sacral region, stage 2: Secondary | ICD-10-CM | POA: Diagnosis not present

## 2018-05-17 DIAGNOSIS — M10031 Idiopathic gout, right wrist: Secondary | ICD-10-CM | POA: Diagnosis not present

## 2018-05-17 DIAGNOSIS — E1142 Type 2 diabetes mellitus with diabetic polyneuropathy: Secondary | ICD-10-CM | POA: Diagnosis not present

## 2018-05-17 DIAGNOSIS — F0390 Unspecified dementia without behavioral disturbance: Secondary | ICD-10-CM | POA: Diagnosis not present

## 2018-05-17 DIAGNOSIS — I10 Essential (primary) hypertension: Secondary | ICD-10-CM | POA: Diagnosis not present

## 2018-05-17 DIAGNOSIS — E785 Hyperlipidemia, unspecified: Secondary | ICD-10-CM | POA: Diagnosis not present

## 2018-05-18 LAB — URINE CULTURE: Culture: >=100000 — AB

## 2018-05-19 ENCOUNTER — Telehealth: Payer: Self-pay | Admitting: Emergency Medicine

## 2018-05-19 NOTE — Telephone Encounter (Signed)
Post ED Visit - Positive Culture Follow-up  Culture report reviewed by antimicrobial stewardship pharmacist:  []  Elenor Quinones, Pharm.D. []  Heide Guile, Pharm.D., BCPS AQ-ID []  Parks Ne[place]une, Pharm.D., BCPS []  Alycia Rossetti, Pharm.D., BCPS []  [place], Pharm.D., BCPS, AAHIVP []  Legrand [place], Pharm.D., BCPS, AAHIVP [x]  Salome Arnt, PharmD, BCPS []  Johnnette Gourd, PharmD, BCPS []  Hughes Better, PharmD, BCPS []  Leeroy Cha, PharmD  Positive Urine culture Treated with Cephalexin, organism sensitive to the same and no further patient follow-up is required at this time.  [name] 05/19/2018, 10:09 AM

## 2018-05-21 ENCOUNTER — Ambulatory Visit: Payer: Medicare Other | Admitting: Internal Medicine

## 2018-05-24 ENCOUNTER — Encounter: Payer: Self-pay | Admitting: Nurse Practitioner

## 2018-05-24 ENCOUNTER — Ambulatory Visit (INDEPENDENT_AMBULATORY_CARE_PROVIDER_SITE_OTHER): Payer: Medicare Other | Admitting: Nurse Practitioner

## 2018-05-24 VITALS — BP 184/106 | HR 64 | Temp 98.1°F | Resp 10

## 2018-05-24 DIAGNOSIS — K5900 Constipation, unspecified: Secondary | ICD-10-CM | POA: Diagnosis not present

## 2018-05-24 DIAGNOSIS — M10031 Idiopathic gout, right wrist: Secondary | ICD-10-CM | POA: Diagnosis not present

## 2018-05-24 DIAGNOSIS — E785 Hyperlipidemia, unspecified: Secondary | ICD-10-CM | POA: Diagnosis not present

## 2018-05-24 DIAGNOSIS — I10 Essential (primary) hypertension: Secondary | ICD-10-CM | POA: Diagnosis not present

## 2018-05-24 DIAGNOSIS — L89152 Pressure ulcer of sacral region, stage 2: Secondary | ICD-10-CM | POA: Diagnosis not present

## 2018-05-24 DIAGNOSIS — R1310 Dysphagia, unspecified: Secondary | ICD-10-CM | POA: Diagnosis not present

## 2018-05-24 DIAGNOSIS — F0390 Unspecified dementia without behavioral disturbance: Secondary | ICD-10-CM | POA: Diagnosis not present

## 2018-05-24 DIAGNOSIS — E1142 Type 2 diabetes mellitus with diabetic polyneuropathy: Secondary | ICD-10-CM | POA: Diagnosis not present

## 2018-05-24 DIAGNOSIS — N3000 Acute cystitis without hematuria: Secondary | ICD-10-CM | POA: Diagnosis not present

## 2018-05-24 NOTE — Patient Instructions (Addendum)
To spread clonidine out throughout the day to take every 8 hours for better blood pressure control.  To complete antibiotics  To use miralax as needed to keep from being constipation.  ===  Follow up in 4 weeks for follow up on blood pressure, constipation, etc

## 2018-05-24 NOTE — Progress Notes (Signed)
Careteam: Patient Care Team: [name], NP as PCP - General (Geriatric Medicine) [name], MD as Consulting Physician (Oncology) [name], [name], LCSW as Social Worker (Licensed Clinical Social Worker) [name], RN as Registered Nurse [facility] [place])  Advanced Directive information    No Known Allergies  Chief Complaint  Patient presents with  . Follow-up    ER follow-up for near syncope episode, abnormal speech, irregular bowels and b/p concerns. Patient was dx with UTI and currently on ABX   . Hypertension    ongoing elevated b/p, today's am reading was 175/105. Patient was given 5 mg of Norvasc this am in error (this is someone else's b/p medication)   . Medication Management    Increased metformin temporarily on 05/17/2018 due to elevated BS (question if related to ABX)      HPI: Patient is a 83 y.o. female seen in the office today to follow up ED visit. She went to ED after found to be unresponsive with slurred speech. Troponin was negative. Neurologist did not feel like she was having a stroke and did not recommend MRI. She was felt to be at baseline in the ED. It was found that she had a UTI.  She was given a dose of rocephin in the ED and discharged on keflex- has 3 more doses left.  In retrospect daughter said Ms Gerome Sam was having slurred speech for several days before that has now improved but not back to baseline.   She had been severely constipated. Taking miralax daily, took several days but had a huge BM.   Blood pressure elevated-  Took norvasc this morning, this is another person in their home medication. [name] was afraid to give more medication this morning.  Blood pressure continues to be elevated.   Reports coughing more with swallowing recently- has been worse in the last 2 weeks.   Review of Systems:  Review of Systems  Unable to perform ROS: Dementia    Past Medical History:  Diagnosis Date  .  Dementia ([place])   . Diabetes mellitus type II 03/21/2011  . DVT (deep venous thrombosis) ([place]) 03/21/2011  . Hyperlipidemia 03/21/2011  . Hypertension 03/21/2011  . Lymphedema of leg 03/21/2011  . Pulmonary embolism ([place]) 03/21/2011  . Stroke [facility])    Past Surgical History:  Procedure Laterality Date  . CHOLECYSTECTOMY  2015  . SKIN TAG REMOVAL  07/25/2016  . VAGINAL HYSTERECTOMY     unknown of date  . VASCULAR SURGERY     Social History:   reports that she has never smoked. She has never used smokeless tobacco. She reports that she does not drink alcohol or use drugs.  Family History  Problem Relation Age of Onset  . Heart attack Mother   . COPD Sister   . Stroke Sister   . Bipolar disorder Daughter     Medications: Patient's Medications  New Prescriptions   No medications on file  Previous Medications   ACCU-CHEK SOFTCLIX LANCETS LANCETS    1 each by Other route daily. Use as instructed Dx: E11.9   ACETAMINOPHEN (TYLENOL) 500 MG TABLET    Take 500 mg by mouth every 6 (six) hours as needed (pain).    ATORVASTATIN (LIPITOR) 20 MG TABLET    Take 1 tablet (20 mg total) by mouth daily at 6 PM.   CHOLECALCIFEROL (VITAMIN D-3) 125 MCG (5000 UT) TABS    Take 5,000 Units by mouth daily.  CLONIDINE (CATAPRES) 0.1 MG TABLET    Take 0.1 mg by mouth 2 (two) times daily. May take an additional tablet if SBP >170   DICLOFENAC SODIUM (VOLTAREN) 1 % GEL    Apply 1 application topically 2 (two) times daily.    GABAPENTIN (NEURONTIN) 300 MG CAPSULE    Take 300 mg by mouth 2 (two) times daily.   GLUCOSE BLOOD (ACCU-CHEK AVIVA PLUS) TEST STRIP    1 each by Other route 2 (two) times daily. E11.9   METFORMIN (GLUCOPHAGE) 500 MG TABLET    Daily at breakfast, hold for blood sugar less than 100   POLYETHYLENE GLYCOL (MIRALAX / GLYCOLAX) PACKET    Take 17 g by mouth as needed.   RIVAROXABAN (XARELTO) 15 MG TABS TABLET    Take 1 tablet (15 mg total) by mouth daily with supper.   VITAMINS A & D  (VITAMIN A & D) OINTMENT    Apply 1 application topically as needed for dry skin.   WOUND DRESSINGS (MEPILEX EX)    Apply to sacrum and change every 3 days or sooner as needed  Modified Medications   No medications on file  Discontinued Medications   CLONIDINE (CATAPRES) 0.1 MG TABLET    Take 1 tablet (0.1 mg total) by mouth daily as needed. May give 1 tablet daily if SBP >170   LOSARTAN (COZAAR) 50 MG TABLET    Take 1 tablet (50 mg total) by mouth daily.   UNABLE TO FIND    Mepilex: Apply to sacrum and change every 3 days or sooner as needed     Physical Exam:  Vitals:   05/24/18 1146  BP: (!) 184/106  Pulse: 64  Resp: 10  Temp: 98.1 F (36.7 C)  TempSrc: Oral   There is no height or weight on file to calculate BMI.  Physical Exam Constitutional:      Appearance: Normal appearance. She is well-developed.  HENT:     Mouth/Throat:     Pharynx: No oropharyngeal exudate.  Eyes:     General: No scleral icterus.    Pupils: Pupils are equal, round, and reactive to light.  Neck:     Musculoskeletal: Neck supple.     Thyroid: No thyromegaly.     Vascular: No carotid bruit.  Cardiovascular:     Rate and Rhythm: Normal rate and regular rhythm.     Heart sounds: Murmur (1/6 SEM) present. No friction rub. No gallop.      Comments: +2 pitting LE edema b/l. No calf TTP Pulmonary:     Effort: Pulmonary effort is normal. No respiratory distress.     Breath sounds: Normal breath sounds. No stridor. No wheezing or rales.  Abdominal:     Palpations: Abdomen is not rigid. There is no hepatomegaly.  Musculoskeletal:     Right lower leg: Edema present.     Left lower leg: Edema present.  Skin:    General: Skin is warm and dry.     Findings: No rash.  Neurological:     Mental Status: She is alert.     Deep Tendon Reflexes: Reflexes are normal and symmetric.     Labs reviewed: Basic Metabolic Panel: Recent Labs    04/13/18 1106 04/18/18 1201 05/15/18 1727 05/15/18 1734  NA  139 136 136 137  K 3.7 4.0 4.0 4.1  CL 99 97* 102 101  CO2 28 33* 25  --   GLUCOSE 87 179* 225* 227*  BUN 14 16 15  19  CREATININE 0.37* 0.44 0.50 0.40*  CALCIUM 9.3 9.1 9.0  --    Liver Function Tests: Recent Labs    04/01/18 1230 04/13/18 1106 04/18/18 1201 05/15/18 1727  AST 19 13 11* 16  ALT 14 9 9 11   ALKPHOS 54  --  53 49  BILITOT 0.9 0.5 0.3 0.6  PROT 7.1 6.6 6.4* 6.6  ALBUMIN 3.6  --  3.5 2.9*   No results for input(s): LIPASE, AMYLASE in the last 8760 hours. No results for input(s): AMMONIA in the last 8760 hours. CBC: Recent Labs    04/01/18 1230  04/18/18 1201 05/15/18 1727 05/15/18 1734  WBC 6.6  --  4.3 3.9*  --   NEUTROABS 4.4  --  2.4 1.7  --   HGB 11.9*   < > 10.3* 10.5* 11.9*  HCT 38.7   < > 34.6* 34.8* 35.0*  MCV 95.1  --  99.4 96.7  --   PLT 145*  --  256 211  --    < > = values in this interval not displayed.   Lipid Panel: Recent Labs    07/11/17 0950 10/18/17 1032  CHOL 154 150  HDL 44* 40*  LDLCALC 91 93  TRIG 92 78  CHOLHDL 3.5 3.8   TSH: No results for input(s): TSH in the last 8760 hours. A1C: Lab Results  Component Value Date   HGBA1C 6.1 (H) 04/13/2018     Assessment/Plan 1. Essential hypertension -hard to control htn, pt bp is elevated today because clonidine was not given this morning since she took her housemates blood pressure medication. Plan to give dose when she gets home. Will increase to TID and to take blood pressure AFTER medication has been given to assess for proper control.  - cloNIDine (CATAPRES) 0.1 MG tablet; Take 1 tablet (0.1 mg total) by mouth every 8 (eight) hours. May take an additional tablet if SBP >170  Dispense: 90 tablet; Refill: 1  2. Dementia without behavioral disturbance, unspecified dementia type ([place]) Progressive decline noted. [name] provides hx and care. Reports more trouble with swallowing and coughing when eating  3. Acute cystitis without hematuria To complete antibiotics as  prescribed  4. Constipation, unspecified constipation type To add miralax as needed for constipation. Encouraged proper fluid intake and fiber  5. Dysphagia, unspecified type -family has noticed a change in the last few weeks.  - SLP modified barium swallow; Future  Next appt: 4 weeks for routine follow up.  Carlos American. [place], [facility] [phone]

## 2018-05-25 ENCOUNTER — Other Ambulatory Visit (HOSPITAL_COMMUNITY): Payer: Self-pay

## 2018-05-25 DIAGNOSIS — R1310 Dysphagia, unspecified: Secondary | ICD-10-CM

## 2018-05-30 MED ORDER — CLONIDINE HCL 0.1 MG PO TABS: 0.1000 mg | ORAL_TABLET | Freq: Three times a day (TID) | ORAL | 1 refills | Status: DC

## 2018-05-31 ENCOUNTER — Telehealth: Payer: Self-pay | Admitting: *Deleted

## 2018-05-31 ENCOUNTER — Other Ambulatory Visit: Admitting: Licensed Clinical Social Worker

## 2018-05-31 ENCOUNTER — Other Ambulatory Visit: Payer: Medicare Other | Admitting: *Deleted

## 2018-05-31 DIAGNOSIS — M10031 Idiopathic gout, right wrist: Secondary | ICD-10-CM | POA: Diagnosis not present

## 2018-05-31 DIAGNOSIS — I10 Essential (primary) hypertension: Secondary | ICD-10-CM | POA: Diagnosis not present

## 2018-05-31 DIAGNOSIS — E1142 Type 2 diabetes mellitus with diabetic polyneuropathy: Secondary | ICD-10-CM | POA: Diagnosis not present

## 2018-05-31 DIAGNOSIS — Z515 Encounter for palliative care: Secondary | ICD-10-CM

## 2018-05-31 DIAGNOSIS — L89152 Pressure ulcer of sacral region, stage 2: Secondary | ICD-10-CM | POA: Diagnosis not present

## 2018-05-31 DIAGNOSIS — E785 Hyperlipidemia, unspecified: Secondary | ICD-10-CM | POA: Diagnosis not present

## 2018-05-31 DIAGNOSIS — F0390 Unspecified dementia without behavioral disturbance: Secondary | ICD-10-CM | POA: Diagnosis not present

## 2018-05-31 NOTE — Telephone Encounter (Signed)
This is fine, thank you.

## 2018-05-31 NOTE — Telephone Encounter (Signed)
Myriam Jacobson with [facility] called and stated that they had the wound care nurse evaluate patient and she recommended Sequra Moisturizing cleanser to affected area and then apply thick antifungal zinc cream daily and after each incontinent episode. Wants to know if this is ok with you. Please Advise.

## 2018-06-01 NOTE — Telephone Encounter (Signed)
Casey notified

## 2018-06-01 NOTE — Progress Notes (Signed)
COMMUNITY PALLIATIVE CARE RN NOTE  PATIENT NAME: [name] DOB: [birth year] MRN: [id number]  PRIMARY CARE PROVIDER: [name], NP  RESPONSIBLE PARTY:  Acct ID - Guarantor Home Phone Work Phone Relationship Acct Type  [id number] [name], [phone]  Self P/F     [address], [place], [place] [address]    PLAN OF CARE and INTERVENTION:  1. ADVANCE CARE PLANNING/GOALS OF CARE: [name] wants her to remain in her home 2. PATIENT/CAREGIVER EDUCATION: Reviewed BP medication regimen and provided information for respite care for patient as requested 3. DISEASE STATUS: Joint visit made with Palliative Care SW, [name]. Patient is sitting up in bed awake. Her voice is weak and speech is more difficult to understand. Increased generalized weakness and seemed to have difficulty holding her head up. She has a runny nose, watery eyes and occasional non-productive cough. [name] (daughter in Sports coach) states that patient has been having more difficulty swallowing. More prevalent when drinking thin liquids, but is also occurring with solid foods. Patient scheduled for a Swallow Study next week. [name] has not noticed any changes in food consumption. She continues with a home health RN to assess sacral wounds. There is a new wound that has formed on her sacrum, and home health RN is visiting today at 12:30p to assess. She was recently seen in the ED on 05/15/18 for unresponsiveness and slurred speech. She was found to have a UTI and placed on antibiotics. She continues with some elevated BPs. [name] has started giving patient Clonidine at bedtime, and found that patient's BPs are lower in the am. She is giving Losartan usually around 1 or 2pm, unless BP is greater then 160/90, then instead she will give Clonidine. Will continue to monitor.   HISTORY OF PRESENT ILLNESS: This is a 83 yo female who resides in the home with her son and daughter in law. Palliative Care Team continues to follow  patient. Next visit scheduled in 1 month.    CODE STATUS: DNR  ADVANCED DIRECTIVES: Y  MOST FORM: no PPS: 30%   PHYSICAL EXAM:   VITALS: Today's Vitals   05/31/18 1118  BP: (!) 150/88  Pulse: 76  Resp: 16  Temp: 98.7 F (37.1 C)  TempSrc: Temporal  SpO2: 100%  PainSc: 0-No pain    LUNGS: clear to auscultation  CARDIAC: Murmur EXTREMITIES: 2+ edema to bilateral lower extremities SKIN: Sacral wounds present, being treated by home health RN  NEURO: Alert and oriented to self, generalized weakness, total care   (Duration of visit and documentation 60 minutes)    Carolyn Eastern, RN, BSN

## 2018-06-04 ENCOUNTER — Telehealth: Payer: Self-pay | Admitting: *Deleted

## 2018-06-04 NOTE — Telephone Encounter (Signed)
Patient daughter in law, [name] called and stated that they need a letter to take to the [employer] stating patient's incompetence of handling her own affairs. Please Advise.

## 2018-06-04 NOTE — Progress Notes (Signed)
COMMUNITY PALLIATIVE CARE SW NOTE  PATIENT NAME: [name] DOB: 1935/09/05 MRN: [id number]  PRIMARY CARE PROVIDER: [name], NP  RESPONSIBLE PARTY:  Acct ID - Guarantor Home Phone Work Phone Relationship Acct Type  [id number] TEMEKIA, [phone]  Self P/F     [place], [place], [place] [address]     PLAN OF CARE and INTERVENTIONS:             1. GOALS OF CARE/ ADVANCE CARE PLANNING:  Patient's daughter-in-law, [name], wishes for patient to remain in their home.  She is DNR. 2. SOCIAL/EMOTIONAL/SPIRITUAL ASSESSMENT/ INTERVENTIONS:  SW and Palliative Care RN, [name], met with patient and [name].  Patient was in her bed alert.  Her verbalizations were mumbled at times.  SW provided education regarding respite for patient per the request of [facility]. 3. PATIENT/CAREGIVER EDUCATION/ COPING:  Patient will respond sporadically.  Patient's caregiver copes by expressing her feelings openly. 4. PERSONAL EMERGENCY PLAN:  Patient will be placed in her bed if she appears fatigued.  [name] will call EMS as needed. 5. COMMUNITY RESOURCES COORDINATION/ HEALTH CARE NAVIGATION:  A private caregiver or family member is with patient at all times. 6. FINANCIAL/LEGAL CONCERNS/INTERVENTIONS:  None per family.     SOCIAL HX:  Social History   Tobacco Use  . Smoking status: Never Smoker  . Smokeless tobacco: Never Used  Substance Use Topics  . Alcohol use: No    CODE STATUS: DNR  ADVANCED DIRECTIVES: LW and HCPOA MOST FORM COMPLETE:  No HOSPICE EDUCATION PROVIDED: N PPS:  Patient's appetite is normal.  She is dependent for all ADLs. Duration of visit and documentation:  60 minutes.      Carolyn Corn Penni Penado, LCSW

## 2018-06-05 ENCOUNTER — Encounter: Payer: Self-pay | Admitting: Nurse Practitioner

## 2018-06-05 DIAGNOSIS — Z029 Encounter for administrative examinations, unspecified: Secondary | ICD-10-CM

## 2018-06-05 NOTE — Telephone Encounter (Signed)
Daughter notified 

## 2018-06-05 NOTE — Telephone Encounter (Signed)
Letter placed up front  

## 2018-06-06 ENCOUNTER — Encounter: Payer: Self-pay | Admitting: Family

## 2018-06-06 ENCOUNTER — Ambulatory Visit (INDEPENDENT_AMBULATORY_CARE_PROVIDER_SITE_OTHER): Payer: Medicare Other | Admitting: Family

## 2018-06-06 VITALS — BP 140/80 | HR 50 | Temp 98.6°F | Ht 67.0 in

## 2018-06-06 DIAGNOSIS — K5901 Slow transit constipation: Secondary | ICD-10-CM

## 2018-06-06 DIAGNOSIS — K644 Residual hemorrhoidal skin tags: Secondary | ICD-10-CM

## 2018-06-06 MED ORDER — PHENYLEPHRINE-WITCH HAZEL 0.25-50 % RE GEL: 1.0000 "application " | Freq: Two times a day (BID) | RECTAL | 0 refills | Status: DC

## 2018-06-06 NOTE — Progress Notes (Signed)
Provider: Arneta Mahmood FNP-C  [name], NP  Patient Care Team: [name], NP as PCP - General (Geriatric Medicine) [name], MD as Consulting Physician (Oncology) [name], [name], LCSW as Social Worker (Licensed Clinical Social Worker) [name], RN as Registered Nurse [facility])  Extended Emergency Contact Information Primary Emergency Contact: Edwin,[name] Address: [address]          [place], [place] [address] Johnnette Litter of [place] Phone: [phone] Relation: Relative Secondary Emergency Contact: Cottie Banda Address: [place],  [address] Johnnette Litter of [place] Phone: [phone] Relation: Son   Goals of care: Advanced Directive information Advanced Directives 05/15/2018  Does Patient Have a Medical Advance Directive? Yes  Type of Advance Directive [place]  Does patient want to make changes to medical advance directive? -  Copy of [place] in Chart? -     Chief Complaint  Patient presents with  . Acute Visit    Hemorrhoid filled with fluid, daughter noticed monday.     HPI:  Pt is a 83 y.o. female seen today for an acute visit for evaluation of hemorrhoids.she is here escorted by daughter who provides HPI information due to patient's cognitive impairment.she states patient's CNA noticed protruding hemorrhoid from the rectum.No bleeding noted.she states patient has a good appetite and drinks about 30 OZ of water daily but does not like any vegetables/fruits in her diet.daughter gives miralax as needed for constipation.She states patient's bowels have been soft since last visit to the ED 01/2018.     Past Medical History:  Diagnosis Date  . Dementia ([place])   . Diabetes mellitus type II 03/21/2011  . DVT (deep venous thrombosis) ([place]) 03/21/2011  . Hyperlipidemia 03/21/2011  . Hypertension 03/21/2011  . Lymphedema of leg  03/21/2011  . Pulmonary embolism ([place]) 03/21/2011  . Stroke [facility])    Past Surgical History:  Procedure Laterality Date  . CHOLECYSTECTOMY  2015  . SKIN TAG REMOVAL  07/25/2016  . VAGINAL HYSTERECTOMY     unknown of date  . VASCULAR SURGERY      No Known Allergies  Outpatient Encounter Medications as of 06/06/2018  Medication Sig  . ACCU-CHEK SOFTCLIX LANCETS lancets 1 each by Other route daily. Use as instructed Dx: E11.9  . acetaminophen (TYLENOL) 500 MG tablet Take 500 mg by mouth every 6 (six) hours as needed (pain).   Marland Kitchen atorvastatin (LIPITOR) 20 MG tablet Take 1 tablet (20 mg total) by mouth daily at 6 PM.  . Cholecalciferol (VITAMIN D-3) 125 MCG (5000 UT) TABS Take 5,000 Units by mouth daily.  . cloNIDine (CATAPRES) 0.1 MG tablet Take 1 tablet (0.1 mg total) by mouth every 8 (eight) hours. May take an additional tablet if SBP >170  . diclofenac sodium (VOLTAREN) 1 % GEL Apply 1 application topically 2 (two) times daily.   Marland Kitchen gabapentin (NEURONTIN) 300 MG capsule Take 300 mg by mouth 2 (two) times daily.  Marland Kitchen glucose blood (ACCU-CHEK AVIVA PLUS) test strip 1 each by Other route 2 (two) times daily. E11.9  . metFORMIN (GLUCOPHAGE) 500 MG tablet Daily at breakfast, hold for blood sugar less than 100  . polyethylene glycol (MIRALAX / GLYCOLAX) packet Take 17 g by mouth as needed.  . Rivaroxaban (XARELTO) 15 MG TABS tablet Take 1 tablet (15 mg total) by mouth daily with supper.  . Vitamins A & D (VITAMIN A &  D) ointment Apply 1 application topically as needed for dry skin.  . Wound Dressings (MEPILEX EX) Apply to sacrum and change every 3 days or sooner as needed  . Phenylephrine-Witch Hazel (PREPARATION H) 0.25-50 % GEL Place 1 application rectally 2 (two) times daily.   No facility-administered encounter medications on file as of 06/06/2018.     Review of Systems  Unable to perform ROS: Dementia (information provided by patient's daughter present during visit )     There is no  immunization history on file for this patient. Pertinent  Health Maintenance Due  Topic Date Due  . FOOT EXAM  08/08/1945  . PNA vac Low Risk Adult (1 of 2 - PCV13) 08/08/2002  . OPHTHALMOLOGY EXAM  01/24/2018  . HEMOGLOBIN A1C  10/13/2018  . DEXA SCAN  Completed  . INFLUENZA VACCINE  Discontinued   Fall Risk  06/06/2018 05/24/2018 05/04/2018 04/10/2018 01/30/2018  Falls in the past year? 1 1 0 0 No  Number falls in past yr: 0 0 0 0 -  Comment - [place] out of chair 05/06/2018  - - -  Injury with Fall? 0 0 0 0 -  Risk Factor Category  - - - - -  Follow up - - - - -    Vitals:   06/06/18 1557  BP: 140/80  Pulse: (!) 50  Temp: 98.6 F (37 C)  TempSrc: Oral  SpO2: 91%  Height: 5\' 7"  (1.702 m)   Body mass index is 20.36 kg/m. Physical Exam Constitutional:      General: She is not in acute distress.    Appearance: She is normal weight. She is not ill-appearing.  HENT:     Head: Normocephalic.     Right Ear: Tympanic membrane, ear canal and external ear normal. There is no impacted cerumen.     Left Ear: Tympanic membrane, ear canal and external ear normal. There is no impacted cerumen.     Nose: No congestion or rhinorrhea.     Mouth/Throat:     Mouth: Mucous membranes are moist.     Pharynx: Oropharynx is clear. No oropharyngeal exudate or posterior oropharyngeal erythema.  Eyes:     General: No scleral icterus.       Right eye: No discharge.        Left eye: No discharge.     Extraocular Movements: Extraocular movements intact.     Conjunctiva/sclera: Conjunctivae normal.     Pupils: Pupils are equal, round, and reactive to light.  Cardiovascular:     Pulses: Normal pulses.     Heart sounds: Murmur present. No friction rub. No gallop.   Pulmonary:     Effort: Pulmonary effort is normal. No respiratory distress.     Breath sounds: Normal breath sounds. No wheezing, rhonchi or rales.  Chest:     Chest wall: No tenderness.  Abdominal:     General: Abdomen is flat. Bowel  sounds are normal. There is no distension.     Palpations: Abdomen is soft. There is no mass.     Tenderness: There is no abdominal tenderness. There is no right CVA tenderness, left CVA tenderness, guarding or rebound.  Musculoskeletal:        General: No tenderness.     Right lower leg: Edema present.     Left lower leg: Edema present.     Comments: Requires total assistance with transfer from wheelchair to exam table.   Skin:    General: Skin is warm and dry.  Coloration: Skin is not pale.     Findings: No erythema or rash.     Comments: Sacral area previous ulcer site pink in color with small open areas with bright red blood.zinc oxide ointment noted.   Neurological:     Mental Status: She is alert. Mental status is at baseline.     Gait: Gait abnormal.  Psychiatric:        Attention and Perception: Attention normal. She does not perceive visual hallucinations.        Mood and Affect: Mood normal.        Behavior: Behavior is cooperative.        Thought Content: Thought content normal.        Cognition and Memory: Cognition is impaired.     Labs reviewed: Recent Labs    04/13/18 1106 04/18/18 1201 05/15/18 1727 05/15/18 1734  NA 139 136 136 137  K 3.7 4.0 4.0 4.1  CL 99 97* 102 101  CO2 28 33* 25  --   GLUCOSE 87 179* 225* 227*  BUN 14 16 15 19   CREATININE 0.37* 0.44 0.50 0.40*  CALCIUM 9.3 9.1 9.0  --    Recent Labs    04/01/18 1230 04/13/18 1106 04/18/18 1201 05/15/18 1727  AST 19 13 11* 16  ALT 14 9 9 11   ALKPHOS 54  --  53 49  BILITOT 0.9 0.5 0.3 0.6  PROT 7.1 6.6 6.4* 6.6  ALBUMIN 3.6  --  3.5 2.9*   Recent Labs    04/01/18 1230  04/18/18 1201 05/15/18 1727 05/15/18 1734  WBC 6.6  --  4.3 3.9*  --   NEUTROABS 4.4  --  2.4 1.7  --   HGB 11.9*   < > 10.3* 10.5* 11.9*  HCT 38.7   < > 34.6* 34.8* 35.0*  MCV 95.1  --  99.4 96.7  --   PLT 145*  --  256 211  --    < > = values in this interval not displayed.   Lab Results  Component Value Date    TSH 2.39 01/10/2017   Lab Results  Component Value Date   HGBA1C 6.1 (H) 04/13/2018   Lab Results  Component Value Date   CHOL 150 10/18/2017   HDL 40 (L) 10/18/2017   LDLCALC 93 10/18/2017   TRIG 78 10/18/2017   CHOLHDL 3.8 10/18/2017    Significant Diagnostic Results in last 30 days:  Dg Chest 2 View  Result Date: 05/15/2018 CLINICAL DATA:  Altered level of consciousness. Incoherent speech. EXAM: CHEST - 2 VIEW COMPARISON:  11/08/2016 FINDINGS: Stable cardiomegaly with aortic atherosclerosis. Minimal atelectasis and/or scarring is noted at the left lung base. No acute pulmonary consolidation or edema. No effusion or pneumothorax. No acute osseous abnormality. Degenerative changes are present along the included dorsal spine and both shoulders. IMPRESSION: Stable cardiomegaly with aortic atherosclerosis. No active pulmonary disease. Electronically Signed   By: Ashley Royalty M.D.   On: 05/15/2018 19:21   Ct Head Code Stroke Wo Contrast  Result Date: 05/15/2018 CLINICAL DATA:  Code stroke. 83 y/o F; slurred speech. History of dementia. EXAM: CT HEAD WITHOUT CONTRAST TECHNIQUE: Contiguous axial images were obtained from the base of the skull through the vertex without intravenous contrast. COMPARISON:  04/01/2018 MRI head.  10/04/2016 CT head. FINDINGS: Brain: No evidence of acute infarction, hemorrhage, hydrocephalus, extra-axial collection or mass lesion/mass effect. Chronic infarct within the left lateral and inferior cerebellar hemisphere and very small chronic infarction within  the right occipital lobe. Advanced chronic microvascular ischemic changes of white matter and volume loss of the brain. Vascular: Calcific atherosclerosis of carotid siphons. No hyperdense vessel identified. Skull: Normal. Negative for fracture or focal lesion. Sinuses/Orbits: No acute finding. Other: Bilateral intra-ocular lens replacement. ASPECTS [facility] Stroke Program Early CT Score) - Ganglionic level infarction  (caudate, lentiform nuclei, internal capsule, insula, M1-M3 cortex): 7 - Supraganglionic infarction (M4-M6 cortex): 3 Total score (0-10 with 10 being normal): 10 IMPRESSION: 1. No acute intracranial abnormality identified. 2. Chronic infarct within the left lateral and inferior cerebellar hemisphere and very small chronic infarction within the right occipital lobe. 3. Advanced chronic microvascular ischemic changes of white matter and volume loss of the brain. 4. ASPECTS is 10 These results were communicated to Dr. Lorraine Lax at 5:09 pmon 1/7/2020by text page via the [facility] messaging system. Electronically Signed   By: Kristine Garbe M.D.   On: 05/15/2018 17:11    Assessment/Plan  1. External hemorrhoid None embolic external hemorrhoid without any bleeding. Start Preparation H gel one application twice daily.Encouraged to increase fluid intake to 6-8 glasses of water daily.Also increase fruits and vegetables in the diet for fiber.   2. Slow transit constipation Change miralax 17 gm one packet mixed in 8 oz of fluid and drink daily.patient's daughter to monitor stool if loose may change miralax to every other day or daily as needed.Increased fluid intake as above and include fiber in diet. Notify provider if no BM > 3 days.  Family/ staff Communication: Reviewed plan of care with patient.   Labs/tests ordered: None   Ahlaya Ende C Madicyn Mesina, NP

## 2018-06-06 NOTE — Patient Instructions (Signed)
Constipation, Adult Constipation is when a person:  Poops (has a bowel movement) fewer times in a week than normal.  Has a hard time pooping.  Has poop that is dry, hard, or bigger than normal. Follow these instructions at home: Eating and drinking   Eat foods that have a lot of fiber, such as: ? Fresh fruits and vegetables. ? Whole grains. ? Beans.  Eat less of foods that are high in fat, low in fiber, or overly processed, such as: ? [place] fries. ? Hamburgers. ? Cookies. ? Candy. ? Soda.  Drink enough fluid to keep your pee (urine) clear or pale yellow. General instructions  Exercise regularly or as told by your doctor.  Go to the restroom when you feel like you need to poop. Do not hold it in.  Take over-the-counter and prescription medicines only as told by your doctor. These include any fiber supplements.  Do pelvic floor retraining exercises, such as: ? Doing deep breathing while relaxing your lower belly (abdomen). ? Relaxing your pelvic floor while pooping.  Watch your condition for any changes.  Keep all follow-up visits as told by your doctor. This is important. Contact a doctor if:  You have pain that gets worse.  You have a fever.  You have not pooped for 4 days.  You throw up (vomit).  You are not hungry.  You lose weight.  You are bleeding from the anus.  You have thin, pencil-like poop (stool). Get help right away if:  You have a fever, and your symptoms suddenly get worse.  You leak poop or have blood in your poop.  Your belly feels hard or bigger than normal (is bloated).  You have very bad belly pain.  You feel dizzy or you faint. This information is not intended to replace advice given to you by your health care provider. Make sure you discuss any questions you have with your health care provider. Document Released: 10/12/2007 Document Revised: 11/13/2015 Document Reviewed: 10/14/2015 Elsevier Interactive Patient Education   2019 [employer] Hemorrhoids Hemorrhoids are swollen veins in and around the rectum or anus. There are two types of hemorrhoids:  Internal hemorrhoids. These occur in the veins that are just inside the rectum. They may poke through to the outside and become irritated and painful.  External hemorrhoids. These occur in the veins that are outside the anus and can be felt as a painful swelling or hard lump near the anus. Most hemorrhoids do not cause serious problems, and they can be managed with home treatments such as diet and lifestyle changes. If home treatments do not help the symptoms, procedures can be done to shrink or remove the hemorrhoids. What are the causes? This condition is caused by increased pressure in the anal area. This pressure may result from various things, including:  Constipation.  Straining to have a bowel movement.  Diarrhea.  Pregnancy.  Obesity.  Sitting for long periods of time.  Heavy lifting or other activity that causes you to strain.  Anal sex.  Riding a bike for a long period of time. What are the signs or symptoms? Symptoms of this condition include:  Pain.  Anal itching or irritation.  Rectal bleeding.  Leakage of stool (feces).  Anal swelling.  One or more lumps around the anus. How is this diagnosed? This condition can often be diagnosed through a visual exam. Other exams or tests may also be done, such as:  An exam that involves feeling the rectal area with  a gloved hand (digital rectal exam).  An exam of the anal canal that is done using a small tube (anoscope).  A blood test, if you have lost a significant amount of blood.  A test to look inside the colon using a flexible tube with a camera on the end (sigmoidoscopy or colonoscopy). How is this treated? This condition can usually be treated at home. However, various procedures may be done if dietary changes, lifestyle changes, and other home treatments do not help your  symptoms. These procedures can help make the hemorrhoids smaller or remove them completely. Some of these procedures involve surgery, and others do not. Common procedures include:  Rubber band ligation. Rubber bands are placed at the base of the hemorrhoids to cut off their blood supply.  Sclerotherapy. Medicine is injected into the hemorrhoids to shrink them.  Infrared coagulation. A type of light energy is used to get rid of the hemorrhoids.  Hemorrhoidectomy surgery. The hemorrhoids are surgically removed, and the veins that supply them are tied off.  Stapled hemorrhoidopexy surgery. The surgeon staples the base of the hemorrhoid to the rectal wall. Follow these instructions at home: Eating and drinking   Eat foods that have a lot of fiber in them, such as whole grains, beans, nuts, fruits, and vegetables.  Ask your health care provider about taking products that have added fiber (fiber supplements).  Reduce the amount of fat in your diet. You can do this by eating low-fat dairy products, eating less red meat, and avoiding processed foods.  Drink enough fluid to keep your urine pale yellow. Managing pain and swelling   Take warm sitz baths for 20 minutes, 3-4 times a day to ease pain and discomfort. You may do this in a bathtub or using a portable sitz bath that fits over the toilet.  If directed, apply ice to the affected area. Using ice packs between sitz baths may be helpful. ? Put ice in a plastic bag. ? Place a towel between your skin and the bag. ? Leave the ice on for 20 minutes, 2-3 times a day. General instructions  Take over-the-counter and prescription medicines only as told by your health care provider.  Use medicated creams or suppositories as told.  Get regular exercise. Ask your health care provider how much and what kind of exercise is best for you. In general, you should do moderate exercise for at least 30 minutes on most days of the week (150 minutes each  week). This can include activities such as walking, biking, or yoga.  Go to the bathroom when you have the urge to have a bowel movement. Do not wait.  Avoid straining to have bowel movements.  Keep the anal area dry and clean. Use wet toilet paper or moist towelettes after a bowel movement.  Do not sit on the toilet for long periods of time. This increases blood pooling and pain.  Keep all follow-up visits as told by your health care provider. This is important. Contact a health care provider if you have:  Increasing pain and swelling that are not controlled by treatment or medicine.  Difficulty having a bowel movement, or you are unable to have a bowel movement.  Pain or inflammation outside the area of the hemorrhoids. Get help right away if you have:  Uncontrolled bleeding from your rectum. Summary  Hemorrhoids are swollen veins in and around the rectum or anus.  Most hemorrhoids can be managed with home treatments such as diet and lifestyle  changes.  Taking warm sitz baths can help ease pain and discomfort.  In severe cases, procedures or surgery can be done to shrink or remove the hemorrhoids. This information is not intended to replace advice given to you by your health care provider. Make sure you discuss any questions you have with your health care provider. Document Released: 04/22/2000 Document Revised: 09/14/2017 Document Reviewed: 09/14/2017 Elsevier Interactive Patient Education  2019 [employer].

## 2018-06-07 DIAGNOSIS — E785 Hyperlipidemia, unspecified: Secondary | ICD-10-CM | POA: Diagnosis not present

## 2018-06-07 DIAGNOSIS — F0390 Unspecified dementia without behavioral disturbance: Secondary | ICD-10-CM | POA: Diagnosis not present

## 2018-06-07 DIAGNOSIS — L89152 Pressure ulcer of sacral region, stage 2: Secondary | ICD-10-CM | POA: Diagnosis not present

## 2018-06-07 DIAGNOSIS — E1142 Type 2 diabetes mellitus with diabetic polyneuropathy: Secondary | ICD-10-CM | POA: Diagnosis not present

## 2018-06-07 DIAGNOSIS — I10 Essential (primary) hypertension: Secondary | ICD-10-CM | POA: Diagnosis not present

## 2018-06-07 DIAGNOSIS — M10031 Idiopathic gout, right wrist: Secondary | ICD-10-CM | POA: Diagnosis not present

## 2018-06-08 ENCOUNTER — Ambulatory Visit (HOSPITAL_COMMUNITY)
Admission: RE | Admit: 2018-06-08 | Discharge: 2018-06-08 | Disposition: A | Discharge status: Home / Self Care | Source: Ambulatory Visit | Attending: Nurse Practitioner | Admitting: Nurse Practitioner

## 2018-06-08 ENCOUNTER — Ambulatory Visit (HOSPITAL_COMMUNITY)

## 2018-06-08 DIAGNOSIS — R1310 Dysphagia, unspecified: Secondary | ICD-10-CM

## 2018-06-15 ENCOUNTER — Telehealth: Payer: Self-pay

## 2018-06-15 NOTE — Telephone Encounter (Signed)
[name] called to inform [name] that patient with skin breakdown on her bottom and is in need of some gauze or pressure pads. Home Health has provided supplies in the past yet their services was discontinued. [name] would like to know if a RX can be sent to possibly help with the cost or if she will have to pay out of pocket for supplies.  Please advise

## 2018-06-18 ENCOUNTER — Inpatient Hospital Stay (HOSPITAL_BASED_OUTPATIENT_CLINIC_OR_DEPARTMENT_OTHER): Payer: Medicare Other | Admitting: Hematology & Oncology

## 2018-06-18 ENCOUNTER — Other Ambulatory Visit: Payer: Self-pay

## 2018-06-18 ENCOUNTER — Inpatient Hospital Stay: Payer: Medicare Other | Attending: Hematology & Oncology

## 2018-06-18 ENCOUNTER — Encounter: Payer: Self-pay | Admitting: Hematology & Oncology

## 2018-06-18 VITALS — BP 196/91 | HR 91 | Temp 97.1°F | Resp 19

## 2018-06-18 DIAGNOSIS — Z86718 Personal history of other venous thrombosis and embolism: Secondary | ICD-10-CM

## 2018-06-18 DIAGNOSIS — Z7901 Long term (current) use of anticoagulants: Secondary | ICD-10-CM | POA: Insufficient documentation

## 2018-06-18 DIAGNOSIS — D509 Iron deficiency anemia, unspecified: Secondary | ICD-10-CM | POA: Insufficient documentation

## 2018-06-18 DIAGNOSIS — Z86711 Personal history of pulmonary embolism: Secondary | ICD-10-CM | POA: Insufficient documentation

## 2018-06-18 DIAGNOSIS — D500 Iron deficiency anemia secondary to blood loss (chronic): Secondary | ICD-10-CM

## 2018-06-18 LAB — CBC WITH DIFFERENTIAL (CANCER CENTER ONLY)
Abs Immature Granulocytes: 0.01 10*3/uL (ref 0.00–0.07)
BASOS PCT: 0 %
Basophils Absolute: 0 10*3/uL (ref 0.0–0.1)
Eosinophils Absolute: 0 10*3/uL (ref 0.0–0.5)
Eosinophils Relative: 1 %
HCT: 36.7 % (ref 36.0–46.0)
Hemoglobin: 11.3 g/dL — ABNORMAL LOW (ref 12.0–15.0)
Immature Granulocytes: 0 %
Lymphocytes Relative: 33 %
Lymphs Abs: 1.7 10*3/uL (ref 0.7–4.0)
MCH: 29.5 pg (ref 26.0–34.0)
MCHC: 30.8 g/dL (ref 30.0–36.0)
MCV: 95.8 fL (ref 80.0–100.0)
Monocytes Absolute: 0.4 10*3/uL (ref 0.1–1.0)
Monocytes Relative: 8 %
Neutro Abs: 2.9 10*3/uL (ref 1.7–7.7)
Neutrophils Relative %: 58 %
PLATELETS: 235 10*3/uL (ref 150–400)
RBC: 3.83 MIL/uL — AB (ref 3.87–5.11)
RDW: 12.4 % (ref 11.5–15.5)
WBC Count: 5.1 10*3/uL (ref 4.0–10.5)
nRBC: 0 % (ref 0.0–0.2)

## 2018-06-18 LAB — CMP (CANCER CENTER ONLY)
ALT: 9 U/L (ref 0–44)
ANION GAP: 7 (ref 5–15)
AST: 12 U/L — ABNORMAL LOW (ref 15–41)
Albumin: 3.7 g/dL (ref 3.5–5.0)
Alkaline Phosphatase: 59 U/L (ref 38–126)
BUN: 25 mg/dL — ABNORMAL HIGH (ref 8–23)
CO2: 32 mmol/L (ref 22–32)
Calcium: 9.9 mg/dL (ref 8.9–10.3)
Chloride: 101 mmol/L (ref 98–111)
Creatinine: 0.56 mg/dL (ref 0.44–1.00)
GFR, Estimated: >60 mL/min (ref >60)
Glucose, Bld: 215 mg/dL — ABNORMAL HIGH (ref 70–99)
Potassium: 4.3 mmol/L (ref 3.5–5.1)
Sodium: 140 mmol/L (ref 135–145)
TOTAL PROTEIN: 6.6 g/dL (ref 6.5–8.1)
Total Bilirubin: 0.3 mg/dL (ref 0.3–1.2)

## 2018-06-18 LAB — RETICULOCYTES
Immature Retic Fract: 12.9 % (ref 2.3–15.9)
RBC.: 3.83 MIL/uL — AB (ref 3.87–5.11)
Retic Count, Absolute: 66.6 10*3/uL (ref 19.0–186.0)
Retic Ct Pct: 1.7 % (ref 0.4–3.1)

## 2018-06-18 NOTE — Progress Notes (Signed)
Hematology and Oncology Follow Up Visit  [name] [id number] 05-14-1935 83 y.o. 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  She had a very nice Christmas.  She was with her caretaker.  I am just very happy that she made it through Christmas without having any issues with bleeding.  She is on Xarelto.  She is doing well with the Xarelto.  We last saw her back in December, her iron studies looked okay.  Her ferritin was 113 with an iron saturation of 36%.  She still has her neurological issues.  She comes in a wheelchair.  She is eating okay.  She has not had any problems with nausea and vomiting.  Overall, her performance status is ECOG 3.     Medications:  Allergies as of 06/18/2018   No Known Allergies     Medication List       Accurate as of June 18, 2018 12:57 PM. Always use your most recent med list.        ACCU-CHEK SOFTCLIX LANCETS lancets 1 each by Other route daily. Use as instructed Dx: E11.9   acetaminophen 500 MG tablet Commonly known as:  TYLENOL Take 500 mg by mouth every 6 (six) hours as needed (pain).   atorvastatin 20 MG tablet Commonly known as:  LIPITOR Take 1 tablet (20 mg total) by mouth daily at 6 PM.   cloNIDine 0.1 MG tablet Commonly known as:  CATAPRES Take 1 tablet (0.1 mg total) by mouth every 8 (eight) hours. May take an additional tablet if SBP >170   diclofenac sodium 1 % Gel Commonly known as:  VOLTAREN Apply 1 application topically 2 (two) times daily.   gabapentin 300 MG capsule Commonly known as:  NEURONTIN Take 300 mg by mouth 2 (two) times daily.   glucose blood test strip Commonly known as:  ACCU-CHEK  AVIVA PLUS 1 each by Other route 2 (two) times daily. E11.9   losartan 25 MG tablet Commonly known as:  COZAAR Take 25 mg by mouth daily as needed.   [place] EX Apply to sacrum and change every 3 days or sooner as needed   metFORMIN 500 MG tablet Commonly known as:  GLUCOPHAGE Daily at breakfast, hold for blood sugar less than 100   Phenylephrine-Witch Hazel 0.25-50 % Gel Commonly known as:  PREPARATION H Place 1 application rectally 2 (two) times daily.   polyethylene glycol packet Commonly known as:  MIRALAX / GLYCOLAX Take 17 g by mouth as needed.   Rivaroxaban 15 MG Tabs tablet Commonly known as:  XARELTO Take 1 tablet (15 mg total) by mouth daily with supper.   vitamin A & D ointment Apply 1 application topically as needed for dry skin.   Vitamin D-3 125 MCG (5000 UT) Tabs Take 5,000 Units by mouth daily.       Allergies: No Known Allergies  Past Medical History, Surgical history, Social history, and Family History were reviewed and updated.  Review of Systems: Review of Systems  Constitutional: Positive for malaise/fatigue.  HENT: Negative.   Eyes: Negative.   Respiratory: Positive for shortness of breath.   Cardiovascular: Positive for palpitations and leg swelling.  Gastrointestinal: Positive for abdominal pain,  constipation and nausea.  Genitourinary: Positive for frequency.  Musculoskeletal: Positive for joint pain and myalgias.  Skin: Negative.   Neurological: Positive for dizziness and focal weakness.  Endo/Heme/Allergies: Negative.   Psychiatric/Behavioral: Negative.      Physical Exam:  oral temperature is 97.1 F (36.2 C) (abnormal). Her blood pressure is 196/91 (abnormal) and her pulse is 91. Her respiration is 19 and oxygen saturation is 100%.   Wt Readings from Last 3 Encounters:  01/30/18 130 lb (59 kg)  08/07/17 130 lb (59 kg)  04/20/17 128 lb (58.1 kg)    Physical Exam Vitals signs reviewed.  HENT:     Head: Normocephalic and  atraumatic.  Eyes:     Pupils: Pupils are equal, round, and reactive to light.  Neck:     Musculoskeletal: Normal range of motion.  Cardiovascular:     Rate and Rhythm: Normal rate and regular rhythm.     Heart sounds: Normal heart sounds.  Pulmonary:     Effort: Pulmonary effort is normal.     Breath sounds: Normal breath sounds.  Abdominal:     General: Bowel sounds are normal.     Palpations: Abdomen is soft.  Musculoskeletal: Normal range of motion.        General: No tenderness or deformity.  Lymphadenopathy:     Cervical: No cervical adenopathy.  Skin:    General: Skin is warm and dry.     Findings: No erythema or rash.  Neurological:     Mental Status: She is alert and oriented to person, place, and time.  Psychiatric:        Behavior: Behavior normal.        Thought Content: Thought content normal.        Judgment: Judgment normal.      Lab Results  Component Value Date   WBC 5.1 06/18/2018   HGB 11.3 (L) 06/18/2018   HCT 36.7 06/18/2018   MCV 95.8 06/18/2018   PLT 235 06/18/2018   Lab Results  Component Value Date   FERRITIN 113 04/18/2018   IRON 66 04/18/2018   TIBC 186 (L) 04/18/2018   UIBC 120 04/18/2018   IRONPCTSAT 36 04/18/2018   Lab Results  Component Value Date   RETICCTPCT 1.7 06/18/2018   RBC 3.83 (L) 06/18/2018   RBC 3.83 (L) 06/18/2018   No results found for: KPAFRELGTCHN, LAMBDASER, KAPLAMBRATIO No results found for: IGGSERUM, IGA, IGMSERUM No results found for: Odetta Pink, SPEI   Chemistry      Component Value Date/Time   NA 140 06/18/2018 1120   NA 142 02/28/2017 1059   K 4.3 06/18/2018 1120   K 3.8 02/28/2017 1059   CL 101 06/18/2018 1120   CL 102 02/28/2017 1059   CO2 32 06/18/2018 1120   CO2 30 02/28/2017 1059   BUN 25 (H) 06/18/2018 1120   BUN 16 02/28/2017 1059   CREATININE 0.56 06/18/2018 1120   CREATININE 0.37 (L) 04/13/2018 1106   GLU 140 03/30/2016        Component Value Date/Time   CALCIUM 9.9 06/18/2018 1120   CALCIUM 9.0 02/28/2017 1059   ALKPHOS 59 06/18/2018 1120   ALKPHOS 68 02/28/2017 1059   AST 12 (L) 06/18/2018 1120   ALT 9 06/18/2018 1120   ALT 14 02/28/2017 1059   BILITOT 0.3 06/18/2018 1120      Impression and Plan: [name] is a very pleasant 83 yo African American female with history of  PE and lower extremity DVT.  We probably need to get her back in about 2 months or so.  I just want to make sure that we are  vigilant in following her hemoglobin so that she does not drop too low.     [name], MD 2/10/202012:57 PM

## 2018-06-18 NOTE — Progress Notes (Signed)
Assesment was done with patient's daughter help.

## 2018-06-18 NOTE — Telephone Encounter (Signed)
Left message on voicemail for [name] to return call. Reason for call, offer appointment as directed by [name], NP

## 2018-06-18 NOTE — Telephone Encounter (Signed)
I would not recommend gauze for her bottom, it really depends on the breakdown, can she bring her in for a visit so I can look at this?

## 2018-06-19 ENCOUNTER — Other Ambulatory Visit: Payer: Self-pay | Admitting: *Deleted

## 2018-06-19 DIAGNOSIS — E1142 Type 2 diabetes mellitus with diabetic polyneuropathy: Secondary | ICD-10-CM

## 2018-06-19 DIAGNOSIS — I10 Essential (primary) hypertension: Secondary | ICD-10-CM

## 2018-06-19 LAB — IRON AND TIBC
Iron: 44 ug/dL (ref 41–142)
Saturation Ratios: 20 % — ABNORMAL LOW (ref 21–57)
TIBC: 221 ug/dL — ABNORMAL LOW (ref 236–444)
UIBC: 177 ug/dL (ref 120–384)

## 2018-06-19 LAB — FERRITIN: Ferritin: 68 ng/mL (ref 11–307)

## 2018-06-19 MED ORDER — LOSARTAN POTASSIUM 25 MG PO TABS: 25.0000 mg | ORAL_TABLET | Freq: Every day | ORAL | 1 refills | Status: DC | PRN

## 2018-06-19 MED ORDER — METFORMIN HCL 500 MG PO TABS: ORAL_TABLET | ORAL | 1 refills | Status: DC

## 2018-06-19 MED ORDER — GABAPENTIN 300 MG PO CAPS: 300.0000 mg | ORAL_CAPSULE | Freq: Two times a day (BID) | ORAL | 1 refills | Status: DC

## 2018-06-19 MED ORDER — ATORVASTATIN CALCIUM 20 MG PO TABS: 20.0000 mg | ORAL_TABLET | Freq: Every day | ORAL | 1 refills | Status: DC

## 2018-06-19 MED ORDER — CLONIDINE HCL 0.1 MG PO TABS: 0.1000 mg | ORAL_TABLET | Freq: Three times a day (TID) | ORAL | 1 refills | Status: DC

## 2018-06-19 NOTE — Telephone Encounter (Signed)
[name] requested refills.

## 2018-06-21 ENCOUNTER — Ambulatory Visit: Admitting: Nurse Practitioner

## 2018-06-25 ENCOUNTER — Ambulatory Visit (INDEPENDENT_AMBULATORY_CARE_PROVIDER_SITE_OTHER): Payer: Medicare Other | Admitting: Nurse Practitioner

## 2018-06-25 ENCOUNTER — Encounter: Payer: Self-pay | Admitting: Nurse Practitioner

## 2018-06-25 VITALS — BP 156/84 | HR 92 | Temp 98.1°F | Ht 67.0 in

## 2018-06-25 DIAGNOSIS — L89153 Pressure ulcer of sacral region, stage 3: Secondary | ICD-10-CM | POA: Diagnosis not present

## 2018-06-25 NOTE — Patient Instructions (Signed)
Home health has been ordered to help with care  OFF LOADING is very important.   Turn every 2 hours  Continue air mattress  Increase protein. To do 2 protein supplements daily  Follow up in 1 week   To use mepilex dressing every 3 days and as needed-- do not use gauze    Pressure Injury  A pressure injury is damage to the skin and underlying tissue that results from pressure being applied to an area of the body. It often affects people who must spend a long time in a bed or chair because of a medical condition. Pressure injuries usually occur:  Over bony parts of the body, such as the tailbone, shoulders, elbows, hips, heels, spine, ankles, and back of the head.  Under medical devices that make contact with the body, such as respiratory equipment, stockings, tubes, and splints. Pressure injuries start as reddened areas on the skin and can lead to pain and an open wound. What are the causes? This condition is caused by frequent or constant pressure to an area of the body. Decreased blood flow to the skin can eventually cause the skin tissue to die and break down, causing a wound. What increases the risk? You are more likely to develop this condition if you:  Are in the hospital or an extended care facility.  Are bedridden or in a wheelchair.  Have an injury or disease that keeps you from: ? Moving normally. ? Feeling pain or pressure.  Have a condition that: ? Makes you sleepy or less alert. ? Causes poor blood flow.  Need to wear a medical device.  Have poor control of your bladder or bowel functions (incontinence).  Have poor nutrition (malnutrition). If you are at risk for pressure injuries, your health care provider may recommend certain types of mattresses, mattress covers, pillows, cushions, or boots to help prevent them. These may include products filled with air, foam, gel, or sand. What are the signs or symptoms? Symptoms of this condition depend on the  severity of the injury. Symptoms may include:  Red or dark areas of the skin.  Pain, warmth, or a change of skin texture.  Blisters.  An open wound. How is this diagnosed? This condition is diagnosed with a medical history and physical exam. You may also have tests, such as:  Blood tests.  Imaging tests.  Blood flow tests. Your pressure injury will be staged based on its severity. Staging is based on:  The depth of the tissue injury, including whether there is exposure of muscle, bone, or tendon.  The cause of the pressure injury. How is this treated? This condition may be treated by:  Relieving or redistributing pressure on your skin. This includes: ? Frequently changing your position. ? Avoiding positions that caused the wound or that can make the wound worse. ? Using specific bed mattresses, chair cushions, or protective boots. ? Moving medical devices from an area of pressure, or placing padding between the skin and the device. ? Using foams, creams, or powders to prevent rubbing (friction) on the skin.  Keeping your skin clean and dry. This may include using a skin cleanser or skin barrier as told by your health care provider.  Cleaning your injury and removing any dead tissue from the wound (debridement).  Placing a bandage (dressing) over your injury.  Using medicines for pain or to prevent or treat infection. Surgery may be needed if other treatments are not working or if your injury is very  deep. Follow these instructions at home: Wound care  Follow instructions from your health care provider about how to take care of your wound. Make sure you: ? Wash your hands with soap and water before and after you change your bandage (dressing). If soap and water are not available, use hand sanitizer. ? Change your dressing as told by your health care provider.  Check your wound every day for signs of infection. Have a caregiver do this for you if you are not able. Check  for: ? Redness, swelling, or increased pain. ? More fluid or blood. ? Warmth. ? Pus or a bad smell. Skin care  Keep your skin clean and dry. Gently pat your skin dry.  Do not rub or massage your skin.  You or a caregiver should check your skin every day for any changes in color or any new blisters or sores (ulcers). Medicines  Take over-the-counter and prescription medicines only as told by your health care provider.  If you were prescribed an antibiotic medicine, take or apply it as told by your health care provider. Do not stop using the antibiotic even if your condition improves. Reducing and redistributing pressure  Do not lie or sit in one position for a long time. Move or change position every 1-2 hours, or as told by your health care provider.  Use pillows or cushions to reduce pressure. Ask your health care provider to recommend cushions or pads for you. General instructions   Eat a healthy diet that includes lots of protein.  Drink enough fluid to keep your urine pale yellow.  Be as active as you can every day. Ask your health care provider to suggest safe exercises or activities.  Do not abuse drugs or alcohol.  Do not use any products that contain nicotine or tobacco, such as cigarettes, e-cigarettes, and chewing tobacco. If you need help quitting, ask your health care provider.  Keep all follow-up visits as told by your health care provider. This is important. Contact a health care provider if:  You have: ? A fever or chills. ? Pain that is not helped by medicine. ? Any changes in skin color. ? New blisters or sores. ? Pus or a bad smell coming from your wound. ? Redness, swelling, or pain around your wound. ? More fluid or blood coming from your wound.  Your wound does not improve after 1-2 weeks of treatment. Summary  A pressure injury is damage to the skin and underlying tissue that results from pressure being applied to an area of the body.  Do not  lie or sit in one position for a long time. Your health care provider may advise you to move or change position every 1-2 hours.  Follow instructions from your health care provider about how to take care of your wound.  Keep all follow-up visits as told by your health care provider. This is important. This information is not intended to replace advice given to you by your health care provider. Make sure you discuss any questions you have with your health care provider. Document Released: 04/25/2005 Document Revised: 11/22/2017 Document Reviewed: 11/22/2017 Elsevier Interactive Patient Education  [employer].

## 2018-06-25 NOTE — Progress Notes (Signed)
Careteam: Patient Care Team: [name], NP as PCP - General (Geriatric Medicine) [name], MD as Consulting Physician (Oncology) [name], [name], LCSW as Social Worker (Licensed Clinical Social Worker) [name], RN as Registered Nurse ([facility] [place])  Advanced Directive information Does Patient Have a Medical Advance Directive?: Yes, Type of Advance Directive: Healthcare Power of Attorney, Does patient want to make changes to medical advance directive?: No - Patient declined  No Known Allergies  Chief Complaint  Patient presents with  . Acute Visit    wound on gluteal since january2020, left wrist swelling,[name] Mullings daughter in law is with patien today      HPI: Patient is a 83 y.o. female seen in the office today to follow up wound on buttock.  Daughter reports wound was healed but has gotten worse and in the last day has reopened. They are using gauze for dressing changing daily. No odor noted. Minimal drainage noted on gauze. No fever or chills. Pt bed bound and daughter reports that they have gotten an air mattress. Her intake is poor therefore they do protein supplements.  States they used to have home health for the wound but they have since signed off since the wound had healed.  Review of Systems:  Review of Systems  Unable to perform ROS: Dementia    Past Medical History:  Diagnosis Date  . Dementia ([place])   . Diabetes mellitus type II 03/21/2011  . DVT (deep venous thrombosis) ([place]) 03/21/2011  . Hyperlipidemia 03/21/2011  . Hypertension 03/21/2011  . Lymphedema of leg 03/21/2011  . Pulmonary embolism ([place]) 03/21/2011  . Stroke [facility])    Past Surgical History:  Procedure Laterality Date  . CHOLECYSTECTOMY  2015  . SKIN TAG REMOVAL  07/25/2016  . VAGINAL HYSTERECTOMY     unknown of date  . VASCULAR SURGERY     Social History:   reports that she has never smoked. She has never used smokeless tobacco. She reports  that she does not drink alcohol or use drugs.  Family History  Problem Relation Age of Onset  . Heart attack Mother   . COPD Sister   . Stroke Sister   . Bipolar disorder Daughter     Medications: Patient's Medications  New Prescriptions   No medications on file  Previous Medications   ACCU-CHEK SOFTCLIX LANCETS LANCETS    1 each by Other route daily. Use as instructed Dx: E11.9   ACETAMINOPHEN (TYLENOL) 500 MG TABLET    Take 500 mg by mouth every 6 (six) hours as needed (pain).    ATORVASTATIN (LIPITOR) 20 MG TABLET    Take 1 tablet (20 mg total) by mouth daily at 6 PM.   CHOLECALCIFEROL (VITAMIN D-3) 125 MCG (5000 UT) TABS    Take 5,000 Units by mouth daily.   CLONIDINE (CATAPRES) 0.1 MG TABLET    Take 1 tablet (0.1 mg total) by mouth every 8 (eight) hours. May take an additional tablet if SBP >170   DICLOFENAC SODIUM (VOLTAREN) 1 % GEL    Apply 1 application topically 2 (two) times daily.    GABAPENTIN (NEURONTIN) 300 MG CAPSULE    Take 1 capsule (300 mg total) by mouth 2 (two) times daily.   GLUCOSE BLOOD (ACCU-CHEK AVIVA PLUS) TEST STRIP    1 each by Other route 2 (two) times daily. E11.9   LOSARTAN (COZAAR) 25 MG TABLET    Take 1 tablet (25 mg total) by mouth daily  as needed.   METFORMIN (GLUCOPHAGE) 500 MG TABLET    Twice a day, hold for blood sugar less than 100   PHENYLEPHRINE-WITCH HAZEL (PREPARATION H) 0.25-50 % GEL    Place 1 application rectally 2 (two) times daily.   POLYETHYLENE GLYCOL (MIRALAX / GLYCOLAX) PACKET    Take 17 g by mouth as needed.   RIVAROXABAN (XARELTO) 15 MG TABS TABLET    Take 1 tablet (15 mg total) by mouth daily with supper.   VITAMINS A & D (VITAMIN A & D) OINTMENT    Apply 1 application topically as needed for dry skin.   WOUND DRESSINGS ([place] EX)    Apply to sacrum and change every 3 days or sooner as needed  Modified Medications   No medications on file  Discontinued Medications   No medications on file     Physical Exam:  Vitals:    06/25/18 1521  BP: (!) 156/84  Pulse: 92  Temp: 98.1 F (36.7 C)  SpO2: 93%  Height: 5\' 7"  (1.702 m)        There is no height or weight on file to calculate BMI.  Physical Exam Constitutional:      Appearance: Normal appearance. She is well-developed.  HENT:     Mouth/Throat:     Pharynx: No oropharyngeal exudate.  Eyes:     General: No scleral icterus.    Pupils: Pupils are equal, round, and reactive to light.  Neck:     Musculoskeletal: Neck supple.     Thyroid: No thyromegaly.     Vascular: No carotid bruit.  Cardiovascular:     Rate and Rhythm: Normal rate and regular rhythm.     Heart sounds: Murmur (1/6 SEM) present. No friction rub. No gallop.      Comments: +2 pitting LE edema b/l. No calf TTP Pulmonary:     Effort: Pulmonary effort is normal. No respiratory distress.     Breath sounds: Normal breath sounds. No stridor. No wheezing or rales.  Abdominal:     Palpations: Abdomen is not rigid. There is no hepatomegaly.  Skin:    General: Skin is warm and dry.     Findings: No erythema or rash.     Comments: 2 cm X 3 cm irregular wound (stage 3) on sacrum. Red center without drainage or odor.   Neurological:     Mental Status: She is alert. She is disoriented.     Labs reviewed: Basic Metabolic Panel: Recent Labs    04/18/18 1201 05/15/18 1727 05/15/18 1734 06/18/18 1120  NA 136 136 137 140  K 4.0 4.0 4.1 4.3  CL 97* 102 101 101  CO2 33* 25  --  32  GLUCOSE 179* 225* 227* 215*  BUN 16 15 19  25*  CREATININE 0.44 0.50 0.40* 0.56  CALCIUM 9.1 9.0  --  9.9   Liver Function Tests: Recent Labs    04/18/18 1201 05/15/18 1727 06/18/18 1120  AST 11* 16 12*  ALT 9 11 9   ALKPHOS 53 49 59  BILITOT 0.3 0.6 0.3  PROT 6.4* 6.6 6.6  ALBUMIN 3.5 2.9* 3.7   No results for input(s): LIPASE, AMYLASE in the last 8760 hours. No results for input(s): AMMONIA in the last 8760 hours. CBC: Recent Labs    04/18/18 1201 05/15/18 1727 05/15/18 1734  06/18/18 1120  WBC 4.3 3.9*  --  5.1  NEUTROABS 2.4 1.7  --  2.9  HGB 10.3* 10.5* 11.9* 11.3*  HCT 34.6* 34.8*  35.0* 36.7  MCV 99.4 96.7  --  95.8  PLT 256 211  --  235   Lipid Panel: Recent Labs    07/11/17 0950 10/18/17 1032  CHOL 154 150  HDL 44* 40*  LDLCALC 91 93  TRIG 92 78  CHOLHDL 3.5 3.8   TSH: No results for input(s): TSH in the last 8760 hours. A1C: Lab Results  Component Value Date   HGBA1C 6.1 (H) 04/13/2018     Assessment/Plan 1. Pressure ulcer of sacral region, stage 3 (HCC) No signs of infection at this time. Red base. Instructed not to use gauze. To use mepilex to area and change every 3 days and as needed. Important to turn q 2 hours to prevent further breakdown. Continue to use airmattress. Increase supplements to twice daily. Stress importance of increase protein to promote wound healing - Ambulatory referral to [place] for ongoing monitoring and treatment of wound.  Next appt: 1 week for follow up.  Carlos American. [place], [facility] [phone]

## 2018-06-28 ENCOUNTER — Ambulatory Visit: Admitting: Nurse Practitioner

## 2018-06-29 DIAGNOSIS — Z86718 Personal history of other venous thrombosis and embolism: Secondary | ICD-10-CM

## 2018-06-29 DIAGNOSIS — Z7984 Long term (current) use of oral hypoglycemic drugs: Secondary | ICD-10-CM | POA: Diagnosis not present

## 2018-06-29 DIAGNOSIS — L89153 Pressure ulcer of sacral region, stage 3: Secondary | ICD-10-CM | POA: Diagnosis not present

## 2018-06-29 DIAGNOSIS — I10 Essential (primary) hypertension: Secondary | ICD-10-CM | POA: Diagnosis not present

## 2018-06-29 DIAGNOSIS — Z86711 Personal history of pulmonary embolism: Secondary | ICD-10-CM

## 2018-06-29 DIAGNOSIS — E1142 Type 2 diabetes mellitus with diabetic polyneuropathy: Secondary | ICD-10-CM | POA: Diagnosis not present

## 2018-06-29 DIAGNOSIS — F0390 Unspecified dementia without behavioral disturbance: Secondary | ICD-10-CM | POA: Diagnosis not present

## 2018-06-29 DIAGNOSIS — I890 Lymphedema, not elsewhere classified: Secondary | ICD-10-CM | POA: Diagnosis not present

## 2018-06-29 DIAGNOSIS — Z8673 Personal history of transient ischemic attack (TIA), and cerebral infarction without residual deficits: Secondary | ICD-10-CM | POA: Diagnosis not present

## 2018-07-02 ENCOUNTER — Ambulatory Visit (INDEPENDENT_AMBULATORY_CARE_PROVIDER_SITE_OTHER): Payer: Medicare Other | Admitting: Nurse Practitioner

## 2018-07-02 ENCOUNTER — Encounter: Payer: Self-pay | Admitting: Nurse Practitioner

## 2018-07-02 VITALS — BP 146/80 | HR 72 | Temp 97.4°F | Ht 67.0 in

## 2018-07-02 DIAGNOSIS — I10 Essential (primary) hypertension: Secondary | ICD-10-CM | POA: Diagnosis not present

## 2018-07-02 DIAGNOSIS — F0390 Unspecified dementia without behavioral disturbance: Secondary | ICD-10-CM

## 2018-07-02 DIAGNOSIS — R52 Pain, unspecified: Secondary | ICD-10-CM | POA: Diagnosis not present

## 2018-07-02 DIAGNOSIS — L89153 Pressure ulcer of sacral region, stage 3: Secondary | ICD-10-CM | POA: Diagnosis not present

## 2018-07-02 MED ORDER — LOSARTAN POTASSIUM 25 MG PO TABS: 25.0000 mg | ORAL_TABLET | Freq: Every day | ORAL | 1 refills | Status: DC

## 2018-07-02 MED ORDER — ACETAMINOPHEN 500 MG PO TABS: ORAL_TABLET | ORAL | Status: DC

## 2018-07-02 NOTE — Patient Instructions (Addendum)
Restart losartan 25 mg by mouth daily.  Give clonidine every 8 hour HOLD if blood pressure <140/90.  Give tylenol 500 mg by mouth twice daily.   Continue home health nursing for sacral wound.

## 2018-07-02 NOTE — Progress Notes (Signed)
Careteam: Patient Care Team: [name], NP as PCP - General (Geriatric Medicine) [name], MD as Consulting Physician (Oncology) [name], [name], LCSW as Social Worker (Licensed Clinical Social Worker) [name], RN as Registered Nurse (Hospice and [place])  Advanced Directive information Does Patient Have a Medical Advance Directive?: Yes, Type of Advance Directive: Healthcare Power of Attorney, Does patient want to make changes to medical advance directive?: No - Patient declined  No Known Allergies  Chief Complaint  Patient presents with  . Medical Management of Chronic Issues    follow up  with wound, discuss right arm soreness from where she fell, and discuss dose with metformin,will get eye exam results     HPI: Patient is a 83 y.o. female seen in the office today for recheck on wound.  Home health has done an initial assessment and would like to do a calcium alligate.   A few weeks ago she fell off the bed. Unsure what exactly she hit. Scab on lateral left elbow but nontender and nurse with home health evaluated it. She does not like to straighten arm but has some contractures of hands with limited ROM to joints, this was baseline prior to fall. No swelling or bruising noted. Also questions if she is in pain but hard to determine where. Hx of back pain. Pain to sacrum and when she moves.   Hypertension- continues to have high blood pressure in the morning. 200s/100s Does better when her blood pressure is a little higher she is higher functioning.   Review of Systems:  Review of Systems  Unable to perform ROS: Dementia    Past Medical History:  Diagnosis Date  . Dementia ([place])   . Diabetes mellitus type II 03/21/2011  . DVT (deep venous thrombosis) ([place]) 03/21/2011  . Hyperlipidemia 03/21/2011  . Hypertension 03/21/2011  . Lymphedema of leg 03/21/2011  . Pulmonary embolism ([place]) 03/21/2011  . Stroke [facility])    Past Surgical  History:  Procedure Laterality Date  . CHOLECYSTECTOMY  2015  . SKIN TAG REMOVAL  07/25/2016  . VAGINAL HYSTERECTOMY     unknown of date  . VASCULAR SURGERY     Social History:   reports that she has never smoked. She has never used smokeless tobacco. She reports that she does not drink alcohol or use drugs.  Family History  Problem Relation Age of Onset  . Heart attack Mother   . COPD Sister   . Stroke Sister   . Bipolar disorder Daughter     Medications: Patient's Medications  New Prescriptions   No medications on file  Previous Medications   ACCU-CHEK SOFTCLIX LANCETS LANCETS    1 each by Other route daily. Use as instructed Dx: E11.9   ACETAMINOPHEN (TYLENOL) 500 MG TABLET    Take 500 mg by mouth every 6 (six) hours as needed (pain).    ATORVASTATIN (LIPITOR) 20 MG TABLET    Take 1 tablet (20 mg total) by mouth daily at 6 PM.   CHOLECALCIFEROL (VITAMIN D-3) 125 MCG (5000 UT) TABS    Take 5,000 Units by mouth daily.   CLONIDINE (CATAPRES) 0.1 MG TABLET    Take 1 tablet (0.1 mg total) by mouth every 8 (eight) hours. May take an additional tablet if SBP >170   DICLOFENAC SODIUM (VOLTAREN) 1 % GEL    Apply 1 application topically 2 (two) times daily.    GABAPENTIN (NEURONTIN) 300 MG CAPSULE    Take 1  capsule (300 mg total) by mouth 2 (two) times daily.   GLUCOSE BLOOD (ACCU-CHEK AVIVA PLUS) TEST STRIP    1 each by Other route 2 (two) times daily. E11.9   LOSARTAN (COZAAR) 25 MG TABLET    Take 1 tablet (25 mg total) by mouth daily as needed.   METFORMIN (GLUCOPHAGE) 500 MG TABLET    Twice a day, hold for blood sugar less than 100   POLYETHYLENE GLYCOL (MIRALAX / GLYCOLAX) PACKET    Take 17 g by mouth as needed.   RIVAROXABAN (XARELTO) 15 MG TABS TABLET    Take 1 tablet (15 mg total) by mouth daily with supper.   VITAMINS A & D (VITAMIN A & D) OINTMENT    Apply 1 application topically as needed for dry skin.   WOUND DRESSINGS (MEPILEX EX)    Apply to sacrum and change every 3 days  or sooner as needed  Modified Medications   No medications on file  Discontinued Medications   PHENYLEPHRINE-WITCH HAZEL (PREPARATION H) 0.25-50 % GEL    Place 1 application rectally 2 (two) times daily.     Physical Exam:  Vitals:   07/02/18 1427  BP: (!) 146/80  Pulse: 72  Temp: (!) 97.4 F (36.3 C)  TempSrc: Oral  Height: 5\' 7"  (1.702 m)   Body mass index is 20.36 kg/m.  Physical Exam Constitutional:      Appearance: Normal appearance. She is well-developed.  HENT:     Mouth/Throat:     Pharynx: No oropharyngeal exudate.  Eyes:     General: No scleral icterus.    Pupils: Pupils are equal, round, and reactive to light.  Neck:     Musculoskeletal: Neck supple.     Thyroid: No thyromegaly.     Vascular: No carotid bruit.  Cardiovascular:     Rate and Rhythm: Normal rate and regular rhythm.     Heart sounds: Murmur (1/6 SEM) present. No friction rub. No gallop.      Comments: +2 pitting LE edema b/l. No calf TTP Pulmonary:     Effort: Pulmonary effort is normal. No respiratory distress.     Breath sounds: Normal breath sounds. No stridor. No wheezing or rales.  Abdominal:     Palpations: Abdomen is not rigid. There is no hepatomegaly.  Musculoskeletal:     Left shoulder: She exhibits decreased range of motion. She exhibits no tenderness.     Left elbow: She exhibits decreased range of motion. She exhibits no swelling. No tenderness found.     Left wrist: She exhibits no tenderness.  Skin:    General: Skin is warm and dry.     Findings: No erythema or rash.     Comments: 2 cm X 1 cm irregular wound (stage 3) on sacrum. Red center with yellow drainage noted   Neurological:     Mental Status: She is alert. She is disoriented.     Labs reviewed: Basic Metabolic Panel: Recent Labs    04/18/18 1201 05/15/18 1727 05/15/18 1734 06/18/18 1120  NA 136 136 137 140  K 4.0 4.0 4.1 4.3  CL 97* 102 101 101  CO2 33* 25  --  32  GLUCOSE 179* 225* 227* 215*  BUN 16  15 19  25*  CREATININE 0.44 0.50 0.40* 0.56  CALCIUM 9.1 9.0  --  9.9   Liver Function Tests: Recent Labs    04/18/18 1201 05/15/18 1727 06/18/18 1120  AST 11* 16 12*  ALT 9 11 9  ALKPHOS 53 49 59  BILITOT 0.3 0.6 0.3  PROT 6.4* 6.6 6.6  ALBUMIN 3.5 2.9* 3.7   No results for input(s): LIPASE, AMYLASE in the last 8760 hours. No results for input(s): AMMONIA in the last 8760 hours. CBC: Recent Labs    04/18/18 1201 05/15/18 1727 05/15/18 1734 06/18/18 1120  WBC 4.3 3.9*  --  5.1  NEUTROABS 2.4 1.7  --  2.9  HGB 10.3* 10.5* 11.9* 11.3*  HCT 34.6* 34.8* 35.0* 36.7  MCV 99.4 96.7  --  95.8  PLT 256 211  --  235   Lipid Panel: Recent Labs    07/11/17 0950 10/18/17 1032  CHOL 154 150  HDL 44* 40*  LDLCALC 91 93  TRIG 92 78  CHOLHDL 3.5 3.8   TSH: No results for input(s): TSH in the last 8760 hours. A1C: Lab Results  Component Value Date   HGBA1C 6.1 (H) 04/13/2018     Assessment/Plan 1. Pressure ulcer of sacral region, stage 3 (HCC) Size has improved, home heath following and plans to change dressing to calcium alginate promote healing per daughter. Continues pressure reduction and   2. Dementia without behavioral disturbance, unspecified dementia type (HCC) Severe dementia, progressive decline noted. She requires total care from family. Assistance for meals. This is a barrier for wound healing and will make process longer to heal wound due to immobility.   3. Essential hypertension Elevated blood pressure. Overall family having a hard time managing. States she does better when blood pressure is on the higher side. Focus on quality of life at this point (Vs strict blood pressure control and her being more lethargic). Will start losartan daily for more optimal control.  - losartan (COZAAR) 25 MG tablet; Take 1 tablet (25 mg total) by mouth daily.  Dispense: 90 tablet; Refill: 1  4. Generalized pain -ongoing. No acute pain noted but suspect worsening pain due to  OA and pressure ulcer. Due to severe dementia assessment is limited for this.  - acetaminophen (TYLENOL) 500 MG tablet; Twice daily scheduled can take additional dose every 6 hours as needed pain  Dispense: 30 tablet  Next appt: 07/26/2018 Janett Billow K. [place], [facility] [phone]

## 2018-07-03 ENCOUNTER — Ambulatory Visit (HOSPITAL_COMMUNITY)

## 2018-07-03 ENCOUNTER — Encounter (HOSPITAL_COMMUNITY)

## 2018-07-03 DIAGNOSIS — Z7984 Long term (current) use of oral hypoglycemic drugs: Secondary | ICD-10-CM | POA: Diagnosis not present

## 2018-07-03 DIAGNOSIS — I890 Lymphedema, not elsewhere classified: Secondary | ICD-10-CM | POA: Diagnosis not present

## 2018-07-03 DIAGNOSIS — L89153 Pressure ulcer of sacral region, stage 3: Secondary | ICD-10-CM | POA: Diagnosis not present

## 2018-07-03 DIAGNOSIS — I10 Essential (primary) hypertension: Secondary | ICD-10-CM | POA: Diagnosis not present

## 2018-07-03 DIAGNOSIS — F0390 Unspecified dementia without behavioral disturbance: Secondary | ICD-10-CM | POA: Diagnosis not present

## 2018-07-03 DIAGNOSIS — E1142 Type 2 diabetes mellitus with diabetic polyneuropathy: Secondary | ICD-10-CM | POA: Diagnosis not present

## 2018-07-04 ENCOUNTER — Other Ambulatory Visit: Payer: Medicare Other | Admitting: Licensed Clinical Social Worker

## 2018-07-04 ENCOUNTER — Other Ambulatory Visit: Payer: Medicare Other | Admitting: *Deleted

## 2018-07-04 DIAGNOSIS — Z515 Encounter for palliative care: Secondary | ICD-10-CM

## 2018-07-04 NOTE — Progress Notes (Signed)
COMMUNITY PALLIATIVE CARE RN NOTE  PATIENT NAME: [name] DOB: 1935/09/26 MRN: [id number]  PRIMARY CARE PROVIDER: [name], NP  RESPONSIBLE PARTY:  Acct ID - Guarantor Home Phone Work Phone Relationship Acct Type  [id number] ALICIA, [phone]  Self P/F     [place], [place],  [address]    PLAN OF CARE and INTERVENTION:  1. ADVANCE CARE PLANNING/GOALS OF CARE: Goal is for patient to remain in the home. She is a DNR. 2. PATIENT/CAREGIVER EDUCATION: Reinforced Safe Transfers and Medication Management 3. DISEASE STATUS: Joint visit made with Palliative Care SW, Lynn [name]. Met with patient, daughter in Scientist, forensic, and hired caregiver. Patient lying in bed awake and alert. She is slow to respond and answers to questions inappropriate at times. Speech also difficult to understand at times. Intermittently confused, but able to follow commands. She denies pain, but daughter reports noticing patient moaning/grimacing at times with transfers or repositioning. She says that she is starting to give patient Tylenol BID to help with pain. Patient also rolled out of bed onto the floor 2 weeks ago. No fractures suspected. She continues to have issues with elevated BPs, especially in the am. [name] says that starting today she is going to give Losartan and Clonidine together in the am to see if this helps and continue Clonidine mid-day and at bedtime. [name] says that patient seems to function better with slighter higher BPs than if too low. Otherwise patient seems lethargic and will not eat well. Patient continues with a home health nurse with [facility] that performs wound care 2x/week in sacral area. Wounds are improving. Patient is total care for all ADLs. They have been keeping patient in bed more regularly and turning every few hours to help wounds to heal. Her meal portion sizes have decreased some, but she is still eating 3 meals/day. Daughter states that patient vomited  last pm, but none today. Patient just completed breakfast upon my arrival. Trace edema noted to R lower extremity and 2+ to L lower extremity. Legs kept elevated while in bed. Will continue to monitor.  HISTORY OF PRESENT ILLNESS:  This is a 83 yo female who resides at home with her daughter in law and son. Palliative Care Team continues to follow patient. Next visit scheduled in 1 month.  CODE STATUS: DNR  ADVANCED DIRECTIVES: N MOST FORM: no PPS: 30%   PHYSICAL EXAM:   VITALS: Today's Vitals   07/04/18 1124  BP: (!) 203/113  Pulse: 75  Resp: 16  Temp: 97.6 F (36.4 C)  TempSrc: Temporal  SpO2: 90%  PainSc: 0-No pain    LUNGS: clear to auscultation  CARDIAC: HR irregular EXTREMITIES: Trace edema to R lower extremity, 2+ to L lower extremity SKIN: Wounds to coccyx being treated by home health, exposed skin is dry and intact  NEURO: Alert and oriented to self, intermittent confusion, some nonsensical speech, total care for ADLs   (Duration of visit and documentation 60 minutes)    Daryl Eastern, RN, BSN

## 2018-07-05 DIAGNOSIS — E1142 Type 2 diabetes mellitus with diabetic polyneuropathy: Secondary | ICD-10-CM | POA: Diagnosis not present

## 2018-07-05 DIAGNOSIS — Z7984 Long term (current) use of oral hypoglycemic drugs: Secondary | ICD-10-CM | POA: Diagnosis not present

## 2018-07-05 DIAGNOSIS — F0390 Unspecified dementia without behavioral disturbance: Secondary | ICD-10-CM | POA: Diagnosis not present

## 2018-07-05 DIAGNOSIS — I890 Lymphedema, not elsewhere classified: Secondary | ICD-10-CM | POA: Diagnosis not present

## 2018-07-05 DIAGNOSIS — L89153 Pressure ulcer of sacral region, stage 3: Secondary | ICD-10-CM | POA: Diagnosis not present

## 2018-07-05 DIAGNOSIS — I10 Essential (primary) hypertension: Secondary | ICD-10-CM | POA: Diagnosis not present

## 2018-07-06 NOTE — Progress Notes (Signed)
COMMUNITY PALLIATIVE CARE SW NOTE  PATIENT NAME: [name] DOB: 1935-07-02 MRN: [id number]  PRIMARY CARE PROVIDER: [name], NP  RESPONSIBLE PARTY:  Acct ID - Guarantor Home Phone Work Phone Relationship Acct Type  [id number] MAKENZI, [phone]  Self P/F     [place], [place], [place] [address]     PLAN OF CARE and INTERVENTIONS:             1. GOALS OF CARE/ ADVANCE CARE PLANNING:  Goal is for patient to remain at home with her son and family.  Patient has a DNR. 2. SOCIAL/EMOTIONAL/SPIRITUAL ASSESSMENT/ INTERVENTIONS:  SW and Palliative Care RN, Daryl Eastern, met with patient and her daughter-in-law, [name], in her home.  Patient was lying in bed alert.  Her verbalizations were less clear.  Patient is remaining in bed more due to a sore on her bottom.  An agency nurse is changing the dressings two times a week.  Patient denied pain. 3. PATIENT/CAREGIVER EDUCATION/ COPING:  Patient responds to questions being asked directly to her.  Provided education to [facility] regarding Hospice. 4. PERSONAL EMERGENCY PLAN:  Patient is placed in bed when she appears to be tired.  EMS is called as needed. 5. COMMUNITY RESOURCES COORDINATION/ HEALTH CARE NAVIGATION:  Patient has private caregivers or family members with her 24/7. 6. FINANCIAL/LEGAL CONCERNS/INTERVENTIONS:  None.     SOCIAL HX:  Social History   Tobacco Use  . Smoking status: Never Smoker  . Smokeless tobacco: Never Used  Substance Use Topics  . Alcohol use: No    CODE STATUS:  DNR ADVANCED DIRECTIVES: LW and HCPOA MOST FORM COMPLETE:  No HOSPICE EDUCATION PROVIDED:  Yes PPS:  Patient's appetite is normal.  She is dependent for all of her ADLs. Duration of visit and documentation:  60 minutes.      [name] Hamsa Laurich, LCSW

## 2018-07-10 ENCOUNTER — Telehealth: Payer: Self-pay | Admitting: *Deleted

## 2018-07-10 DIAGNOSIS — F0390 Unspecified dementia without behavioral disturbance: Secondary | ICD-10-CM | POA: Diagnosis not present

## 2018-07-10 DIAGNOSIS — L89153 Pressure ulcer of sacral region, stage 3: Secondary | ICD-10-CM | POA: Diagnosis not present

## 2018-07-10 DIAGNOSIS — Z7984 Long term (current) use of oral hypoglycemic drugs: Secondary | ICD-10-CM | POA: Diagnosis not present

## 2018-07-10 DIAGNOSIS — I10 Essential (primary) hypertension: Secondary | ICD-10-CM

## 2018-07-10 DIAGNOSIS — I890 Lymphedema, not elsewhere classified: Secondary | ICD-10-CM | POA: Diagnosis not present

## 2018-07-10 DIAGNOSIS — E1142 Type 2 diabetes mellitus with diabetic polyneuropathy: Secondary | ICD-10-CM | POA: Diagnosis not present

## 2018-07-10 NOTE — Telephone Encounter (Signed)
Has seen been taking her clonidine 0.1 mg by mouth three times daily with losartan 25 mg daily? If so can increase losartan to 50 mg daily

## 2018-07-10 NOTE — Telephone Encounter (Signed)
[name] called and stated that patient is taking the Clonidine and Losartan and it helps, but the morning readings are so high and the bottom number doesn't seem like it wants to come down even when the top number is low. Stated that bottom number stays in the upper/high 90's if not higher. Stated that they just wanted to be evaluated by a Cardiologist to see why the heart was trying to work so hard. Please Advise.

## 2018-07-10 NOTE — Telephone Encounter (Signed)
LMOM for Sofie Rower to [place] for [facility] to [facility]

## 2018-07-10 NOTE — Telephone Encounter (Signed)
[name] with [facility] called and stated that patient is having consistent elevation in blood pressure. Has been as high as 202/124 and the medication is not working. Patient and family wants a referral to a Cardiologist. Please Advise.

## 2018-07-11 MED ORDER — LOSARTAN POTASSIUM 25 MG PO TABS: 50.0000 mg | ORAL_TABLET | Freq: Every day | ORAL | 1 refills | Status: DC

## 2018-07-11 NOTE — Telephone Encounter (Signed)
LMOM to return call.

## 2018-07-11 NOTE — Telephone Encounter (Signed)
Lets have her increase losartan to 50 mg by mouth daily, continue clonidine and follow up in office in 1 week. To bring her blood pressure cuff so we can compare in office and blood pressure log. Make sure she is taking blood pressure AFTER medication to get a proper reading on control of her blood pressure.

## 2018-07-11 NOTE — Telephone Encounter (Signed)
Daughter Notified and agreed.  Appointment scheduled for 07/20/18 and will bring log and cuff.

## 2018-07-12 DIAGNOSIS — E1142 Type 2 diabetes mellitus with diabetic polyneuropathy: Secondary | ICD-10-CM | POA: Diagnosis not present

## 2018-07-12 DIAGNOSIS — L89153 Pressure ulcer of sacral region, stage 3: Secondary | ICD-10-CM | POA: Diagnosis not present

## 2018-07-12 DIAGNOSIS — F0390 Unspecified dementia without behavioral disturbance: Secondary | ICD-10-CM | POA: Diagnosis not present

## 2018-07-12 DIAGNOSIS — I10 Essential (primary) hypertension: Secondary | ICD-10-CM | POA: Diagnosis not present

## 2018-07-12 DIAGNOSIS — Z7984 Long term (current) use of oral hypoglycemic drugs: Secondary | ICD-10-CM | POA: Diagnosis not present

## 2018-07-12 DIAGNOSIS — I890 Lymphedema, not elsewhere classified: Secondary | ICD-10-CM | POA: Diagnosis not present

## 2018-07-12 NOTE — Telephone Encounter (Signed)
Spoke with [name] with [facility] and she will advise patient and family.

## 2018-07-12 NOTE — Telephone Encounter (Signed)
[name] with [facility] called and stated that patient's blood pressure this morning was 210/102.  Advised nurse of Jessica's recommendations yesterday regarding increasing Losartan to 50mg  and continuing Clonidine and following up in 1 week in office with BP log and cuff. Nurse agreed and will make sure patient's medication was increased.

## 2018-07-12 NOTE — Telephone Encounter (Signed)
Noted, also make sure they are giving her medication before checking blood pressure. Blood pressure will be high prior to getting blood pressure medication (this is why she is taking it)

## 2018-07-12 NOTE — Telephone Encounter (Signed)
Is this blood pressure a manual blood pressure by the nurse or from a machine?

## 2018-07-12 NOTE — Telephone Encounter (Signed)
Called and spoke with [name] with [facility] [phone] and she stated this BP was a [name] done by her. Also she wanted to let you know that the Patient HAS NOT started the Losartan 50mg . Nurse has advised them to start.

## 2018-07-16 DIAGNOSIS — E1142 Type 2 diabetes mellitus with diabetic polyneuropathy: Secondary | ICD-10-CM | POA: Diagnosis not present

## 2018-07-16 DIAGNOSIS — I10 Essential (primary) hypertension: Secondary | ICD-10-CM | POA: Diagnosis not present

## 2018-07-16 DIAGNOSIS — F0390 Unspecified dementia without behavioral disturbance: Secondary | ICD-10-CM | POA: Diagnosis not present

## 2018-07-16 DIAGNOSIS — I890 Lymphedema, not elsewhere classified: Secondary | ICD-10-CM | POA: Diagnosis not present

## 2018-07-16 DIAGNOSIS — L89153 Pressure ulcer of sacral region, stage 3: Secondary | ICD-10-CM | POA: Diagnosis not present

## 2018-07-16 DIAGNOSIS — Z7984 Long term (current) use of oral hypoglycemic drugs: Secondary | ICD-10-CM | POA: Diagnosis not present

## 2018-07-19 DIAGNOSIS — F0390 Unspecified dementia without behavioral disturbance: Secondary | ICD-10-CM | POA: Diagnosis not present

## 2018-07-19 DIAGNOSIS — Z7984 Long term (current) use of oral hypoglycemic drugs: Secondary | ICD-10-CM | POA: Diagnosis not present

## 2018-07-19 DIAGNOSIS — E1142 Type 2 diabetes mellitus with diabetic polyneuropathy: Secondary | ICD-10-CM | POA: Diagnosis not present

## 2018-07-19 DIAGNOSIS — I890 Lymphedema, not elsewhere classified: Secondary | ICD-10-CM | POA: Diagnosis not present

## 2018-07-19 DIAGNOSIS — L89153 Pressure ulcer of sacral region, stage 3: Secondary | ICD-10-CM | POA: Diagnosis not present

## 2018-07-19 DIAGNOSIS — I10 Essential (primary) hypertension: Secondary | ICD-10-CM | POA: Diagnosis not present

## 2018-07-20 ENCOUNTER — Ambulatory Visit (INDEPENDENT_AMBULATORY_CARE_PROVIDER_SITE_OTHER): Payer: Medicare Other | Admitting: Nurse Practitioner

## 2018-07-20 ENCOUNTER — Encounter: Payer: Self-pay | Admitting: Nurse Practitioner

## 2018-07-20 ENCOUNTER — Other Ambulatory Visit: Payer: Self-pay

## 2018-07-20 VITALS — BP 154/90 | HR 76 | Temp 98.0°F

## 2018-07-20 DIAGNOSIS — I10 Essential (primary) hypertension: Secondary | ICD-10-CM | POA: Diagnosis not present

## 2018-07-20 DIAGNOSIS — L89153 Pressure ulcer of sacral region, stage 3: Secondary | ICD-10-CM

## 2018-07-20 DIAGNOSIS — E1142 Type 2 diabetes mellitus with diabetic polyneuropathy: Secondary | ICD-10-CM

## 2018-07-20 NOTE — Progress Notes (Signed)
Careteam: Patient Care Team: [name], NP as PCP - General (Geriatric Medicine) [name], MD as Consulting Physician (Oncology) [name], [name], LCSW as Social Worker (Licensed Clinical Social Worker) [name], RN as Registered Nurse [facility] and [place])  Advanced Directive information    No Known Allergies  Chief Complaint  Patient presents with  . Follow-up    B/P follow-up , + fall risk screening (moderate). Patient with elevated heart rate at times (home readings). Here with [name] (daughter in law)   . Best Practice Recommendations    DM foot exam due. [name] will schedule overdue eye exam   . Immunizations    Discuss need for pneumonia vaccine      HPI: Patient is a 83 y.o. female seen in the office today for review of blood pressure.  [name] ha been taking blood pressure proir to when BP medicaiton was given and was concern it was high. She is currently getting losartan 50 mg (1 week ago)by mouth daily and clonidine 0.1 mg every 8 hours.  Blood pressure varying from 125-197/64-93. blood pressure after she has gotten her medication are 104-168/64-91 HR 69-104  DM- has been giving her [phone] mg metformin depending on what her blood sugars have been. Blood sugars ranging from 116-178. No low blood sugars.  Giving more supplement   Continues to follow with [facility] nursing for wound- healing well.   Has scheduled labs for next week but does not wish to get them today.   Review of Systems:  Review of Systems  Unable to perform ROS: Dementia    Past Medical History:  Diagnosis Date  . Dementia ([place])   . Diabetes mellitus type II 03/21/2011  . DVT (deep venous thrombosis) ([place]) 03/21/2011  . Hyperlipidemia 03/21/2011  . Hypertension 03/21/2011  . Lymphedema of leg 03/21/2011  . Pulmonary embolism ([place]) 03/21/2011  . Stroke [facility])    Past Surgical History:  Procedure Laterality Date  .  CHOLECYSTECTOMY  2015  . SKIN TAG REMOVAL  07/25/2016  . VAGINAL HYSTERECTOMY     unknown of date  . VASCULAR SURGERY     Social History:   reports that she has never smoked. She has never used smokeless tobacco. She reports that she does not drink alcohol or use drugs.  Family History  Problem Relation Age of Onset  . Heart attack Mother   . COPD Sister   . Stroke Sister   . Bipolar disorder Daughter     Medications: Patient's Medications  New Prescriptions   No medications on file  Previous Medications   ACCU-CHEK SOFTCLIX LANCETS LANCETS    1 each by Other route daily. Use as instructed Dx: E11.9   ACETAMINOPHEN (TYLENOL) 500 MG TABLET    Take 500 mg by mouth as needed.   ATORVASTATIN (LIPITOR) 20 MG TABLET    Take 1 tablet (20 mg total) by mouth daily at 6 PM.   CHOLECALCIFEROL (VITAMIN D-3) 125 MCG (5000 UT) TABS    Take 5,000 Units by mouth daily.   CLONIDINE (CATAPRES) 0.1 MG TABLET    Take 1 tablet (0.1 mg total) by mouth every 8 (eight) hours. May take an additional tablet if SBP >170   DICLOFENAC SODIUM (VOLTAREN) 1 % GEL    Apply 1 application topically 2 (two) times daily.    GABAPENTIN (NEURONTIN) 300 MG CAPSULE    Take 1 capsule (300 mg total) by mouth 2 (two) times daily.  GLUCOSE BLOOD (ACCU-CHEK AVIVA PLUS) TEST STRIP    1 each by Other route 2 (two) times daily. E11.9   LOSARTAN (COZAAR) 25 MG TABLET    Take 2 tablets (50 mg total) by mouth daily.   METFORMIN (GLUCOPHAGE) 500 MG TABLET    Twice a day, hold for blood sugar less than 100   POLYETHYLENE GLYCOL (MIRALAX / GLYCOLAX) PACKET    Take 17 g by mouth as needed.   RIVAROXABAN (XARELTO) 15 MG TABS TABLET    Take 1 tablet (15 mg total) by mouth daily with supper.   VITAMINS A & D (VITAMIN A & D) OINTMENT    Apply 1 application topically as needed for dry skin.   WOUND DRESSINGS ([place] EX)    Apply to sacrum and change every 3 days or sooner as needed  Modified Medications   No medications on file   Discontinued Medications   ACETAMINOPHEN (TYLENOL) 500 MG TABLET    Twice daily scheduled can take additional dose every 6 hours as needed pain     Physical Exam:  Vitals:   07/20/18 1121  BP: (!) 154/90  Pulse: 76  Temp: 98 F (36.7 C)  TempSrc: Oral   There is no height or weight on file to calculate BMI.  Physical Exam Constitutional:      Comments: Frail elderly woman  HENT:     Head: Normocephalic and atraumatic.  Cardiovascular:     Rate and Rhythm: Normal rate and regular rhythm.     Heart sounds: Murmur present.  Pulmonary:     Effort: Pulmonary effort is normal.     Breath sounds: Normal breath sounds.  Abdominal:     General: Bowel sounds are normal.     Palpations: Abdomen is soft.  Musculoskeletal:     Right lower leg: Edema present.     Left lower leg: Edema present.  Skin:    Comments: Daughter defers skin check until follow up in 2 weeks   Neurological:     Mental Status: She is alert. Mental status is at baseline. She is disoriented.     Gait: Gait abnormal (wheelchair bound).  Psychiatric:        Cognition and Memory: Cognition is impaired.     Comments: Talks minimally due to advanced dementia      Labs reviewed: Basic Metabolic Panel: Recent Labs    04/18/18 1201 05/15/18 1727 05/15/18 1734 06/18/18 1120  NA 136 136 137 140  K 4.0 4.0 4.1 4.3  CL 97* 102 101 101  CO2 33* 25  --  32  GLUCOSE 179* 225* 227* 215*  BUN 16 15 19  25*  CREATININE 0.44 0.50 0.40* 0.56  CALCIUM 9.1 9.0  --  9.9   Liver Function Tests: Recent Labs    04/18/18 1201 05/15/18 1727 06/18/18 1120  AST 11* 16 12*  ALT 9 11 9   ALKPHOS 53 49 59  BILITOT 0.3 0.6 0.3  PROT 6.4* 6.6 6.6  ALBUMIN 3.5 2.9* 3.7   No results for input(s): LIPASE, AMYLASE in the last 8760 hours. No results for input(s): AMMONIA in the last 8760 hours. CBC: Recent Labs    04/18/18 1201 05/15/18 1727 05/15/18 1734 06/18/18 1120  WBC 4.3 3.9*  --  5.1  NEUTROABS 2.4 1.7  --   2.9  HGB 10.3* 10.5* 11.9* 11.3*  HCT 34.6* 34.8* 35.0* 36.7  MCV 99.4 96.7  --  95.8  PLT 256 211  --  235  Lipid Panel: Recent Labs    10/18/17 1032  CHOL 150  HDL 40*  LDLCALC 93  TRIG 78  CHOLHDL 3.8   TSH: No results for input(s): TSH in the last 8760 hours. A1C: Lab Results  Component Value Date   HGBA1C 6.1 (H) 04/13/2018     Assessment/Plan 1. Essential hypertension -stable. There are variations in blood pressures, overall after she has received her medications blood pressure goal. <160/90. Higher bp acceptable due to co-morbidies and advance dementia. reviewed home bp and todays bp. To continue losartan 50 mg PO daily with clonidine 0.1 mg by mouth every 8 hours.   2. Pressure ulcer of sacral region, stage 3 ([place]) Defers follow up for 2 weeks, she is wheelchair bound, home health nursing monitoring and area healing well.   3. Type 2 diabetes mellitus with diabetic polyneuropathy, without long-term current use of insulin (HCC) Stable. Will follow up A1c next week. Metformin was reduced at last OV due to downward trending A1c to avoid hypoglycemia.   Next appt: 07/26/2018 Carlos American. [place], [facility] [phone]

## 2018-07-23 DIAGNOSIS — L89153 Pressure ulcer of sacral region, stage 3: Secondary | ICD-10-CM | POA: Diagnosis not present

## 2018-07-23 DIAGNOSIS — I10 Essential (primary) hypertension: Secondary | ICD-10-CM | POA: Diagnosis not present

## 2018-07-23 DIAGNOSIS — I890 Lymphedema, not elsewhere classified: Secondary | ICD-10-CM | POA: Diagnosis not present

## 2018-07-23 DIAGNOSIS — E1142 Type 2 diabetes mellitus with diabetic polyneuropathy: Secondary | ICD-10-CM | POA: Diagnosis not present

## 2018-07-23 DIAGNOSIS — Z7984 Long term (current) use of oral hypoglycemic drugs: Secondary | ICD-10-CM | POA: Diagnosis not present

## 2018-07-23 DIAGNOSIS — F0390 Unspecified dementia without behavioral disturbance: Secondary | ICD-10-CM | POA: Diagnosis not present

## 2018-07-26 ENCOUNTER — Other Ambulatory Visit: Payer: Self-pay

## 2018-07-26 ENCOUNTER — Other Ambulatory Visit: Payer: Medicare Other

## 2018-07-26 DIAGNOSIS — I10 Essential (primary) hypertension: Secondary | ICD-10-CM | POA: Diagnosis not present

## 2018-07-26 DIAGNOSIS — Z7984 Long term (current) use of oral hypoglycemic drugs: Secondary | ICD-10-CM | POA: Diagnosis not present

## 2018-07-26 DIAGNOSIS — E1142 Type 2 diabetes mellitus with diabetic polyneuropathy: Secondary | ICD-10-CM | POA: Diagnosis not present

## 2018-07-26 DIAGNOSIS — I890 Lymphedema, not elsewhere classified: Secondary | ICD-10-CM | POA: Diagnosis not present

## 2018-07-26 DIAGNOSIS — L89153 Pressure ulcer of sacral region, stage 3: Secondary | ICD-10-CM | POA: Diagnosis not present

## 2018-07-26 DIAGNOSIS — F0390 Unspecified dementia without behavioral disturbance: Secondary | ICD-10-CM | POA: Diagnosis not present

## 2018-07-29 DIAGNOSIS — Z86711 Personal history of pulmonary embolism: Secondary | ICD-10-CM | POA: Diagnosis not present

## 2018-07-29 DIAGNOSIS — F0390 Unspecified dementia without behavioral disturbance: Secondary | ICD-10-CM | POA: Diagnosis not present

## 2018-07-29 DIAGNOSIS — Z86718 Personal history of other venous thrombosis and embolism: Secondary | ICD-10-CM | POA: Diagnosis not present

## 2018-07-29 DIAGNOSIS — L89153 Pressure ulcer of sacral region, stage 3: Secondary | ICD-10-CM | POA: Diagnosis not present

## 2018-07-29 DIAGNOSIS — Z7984 Long term (current) use of oral hypoglycemic drugs: Secondary | ICD-10-CM | POA: Diagnosis not present

## 2018-07-29 DIAGNOSIS — I10 Essential (primary) hypertension: Secondary | ICD-10-CM | POA: Diagnosis not present

## 2018-07-29 DIAGNOSIS — E1142 Type 2 diabetes mellitus with diabetic polyneuropathy: Secondary | ICD-10-CM | POA: Diagnosis not present

## 2018-07-29 DIAGNOSIS — Z8673 Personal history of transient ischemic attack (TIA), and cerebral infarction without residual deficits: Secondary | ICD-10-CM | POA: Diagnosis not present

## 2018-07-29 DIAGNOSIS — I890 Lymphedema, not elsewhere classified: Secondary | ICD-10-CM | POA: Diagnosis not present

## 2018-07-30 ENCOUNTER — Ambulatory Visit: Payer: Medicare Other | Admitting: Nurse Practitioner

## 2018-07-30 DIAGNOSIS — E1142 Type 2 diabetes mellitus with diabetic polyneuropathy: Secondary | ICD-10-CM | POA: Diagnosis not present

## 2018-07-30 DIAGNOSIS — I10 Essential (primary) hypertension: Secondary | ICD-10-CM | POA: Diagnosis not present

## 2018-07-30 DIAGNOSIS — L89153 Pressure ulcer of sacral region, stage 3: Secondary | ICD-10-CM | POA: Diagnosis not present

## 2018-07-30 DIAGNOSIS — F0390 Unspecified dementia without behavioral disturbance: Secondary | ICD-10-CM | POA: Diagnosis not present

## 2018-07-30 DIAGNOSIS — Z7984 Long term (current) use of oral hypoglycemic drugs: Secondary | ICD-10-CM | POA: Diagnosis not present

## 2018-07-30 DIAGNOSIS — I890 Lymphedema, not elsewhere classified: Secondary | ICD-10-CM | POA: Diagnosis not present

## 2018-07-31 ENCOUNTER — Ambulatory Visit: Admitting: Nurse Practitioner

## 2018-08-02 ENCOUNTER — Ambulatory Visit: Admitting: Nurse Practitioner

## 2018-08-02 ENCOUNTER — Other Ambulatory Visit: Payer: Medicare Other | Admitting: *Deleted

## 2018-08-02 ENCOUNTER — Other Ambulatory Visit: Payer: Self-pay

## 2018-08-02 DIAGNOSIS — E1142 Type 2 diabetes mellitus with diabetic polyneuropathy: Secondary | ICD-10-CM | POA: Diagnosis not present

## 2018-08-02 DIAGNOSIS — Z515 Encounter for palliative care: Secondary | ICD-10-CM

## 2018-08-02 DIAGNOSIS — Z7984 Long term (current) use of oral hypoglycemic drugs: Secondary | ICD-10-CM | POA: Diagnosis not present

## 2018-08-02 DIAGNOSIS — L89153 Pressure ulcer of sacral region, stage 3: Secondary | ICD-10-CM | POA: Diagnosis not present

## 2018-08-02 DIAGNOSIS — I890 Lymphedema, not elsewhere classified: Secondary | ICD-10-CM | POA: Diagnosis not present

## 2018-08-02 DIAGNOSIS — F0390 Unspecified dementia without behavioral disturbance: Secondary | ICD-10-CM | POA: Diagnosis not present

## 2018-08-02 DIAGNOSIS — I10 Essential (primary) hypertension: Secondary | ICD-10-CM | POA: Diagnosis not present

## 2018-08-06 NOTE — Progress Notes (Signed)
COMMUNITY PALLIATIVE CARE RN NOTE  PATIENT NAME: [name] DOB: [birth year] MRN: [id number]  PRIMARY CARE PROVIDER: [name], NP  RESPONSIBLE PARTY:  Acct ID - Guarantor Home Phone Work Phone Relationship Acct Type  [id number] ABBEGAYLE, [phone]  Self P/F     [place], [place], [place] [address]    PLAN OF CARE and INTERVENTION:  1. ADVANCE CARE PLANNING/GOALS OF CARE: Goal is for patient to remain at home with her son and daughter in law. She is a DNR. 2. PATIENT/CAREGIVER EDUCATION: Reinforced Safe Transfers, Pain Management and Skin Breakdown Prevention/Maintenance 3. DISEASE STATUS: Met with patient and daughter in law, [name], in their home. Patient sitting up in her wheelchair being fed breakfast by hired caregiver. She made eye contact and smiled initially, but did exhibit some mood swings, becoming irritable at caregiver. Caregiver states that she has been somewhat uncooperative this am. Speech is difficult to understand and mainly nonsensical. She is able to answer some simple questions. No physical indicators of pain noted. [name] reports that pain has improved over the past month. Pain is generalized and mainly occurs with movement/transfers. She is now only having to utilize Tylenol on a PRN basis. She continues with some elevated BPs, (160s-180s/70s-90s) but they have improved. She is taking Clonidine and Losartan in the am, Clonidine midday and again at bedtime. Her intake is good. She is eating 3 meals/day. Mainly eating mechanical soft foods. She requires total care with all ADLs. She is incontinent of both bowel and bladder and wears adult briefs. Her wounds to her sacrum continue to improved. Home health RN visits 2x/week and she is to visit this evening to assess wounds. She has pitting edema noted to bilateral lower extremities, L>R. No new issues/concerns. Will continue to monitor.   HISTORY OF PRESENT ILLNESS:  This is a 83 yo female who resides with  her son and daughter in law. Palliative Care Team continues to follow patient. Next visit scheduled in 1 month.  CODE STATUS: DNR ADVANCED DIRECTIVES: Y MOST FORM: no PPS: 30%   PHYSICAL EXAM:  LUNGS: No dyspnea noted CARDIAC: Cor RRR EXTREMITIES: pitting edema bilateral lower extremities L >R SKIN: Sacral wounds that are healing and improving per Cg report  NEURO: Alert and oriented to self only, intermittent confusion, nonsensical speech, total care   (Duration of visit and documentation 60 minutes)    Daryl Eastern, RN BSN

## 2018-08-07 DIAGNOSIS — I10 Essential (primary) hypertension: Secondary | ICD-10-CM | POA: Diagnosis not present

## 2018-08-07 DIAGNOSIS — L89153 Pressure ulcer of sacral region, stage 3: Secondary | ICD-10-CM | POA: Diagnosis not present

## 2018-08-07 DIAGNOSIS — Z7984 Long term (current) use of oral hypoglycemic drugs: Secondary | ICD-10-CM | POA: Diagnosis not present

## 2018-08-07 DIAGNOSIS — F0390 Unspecified dementia without behavioral disturbance: Secondary | ICD-10-CM | POA: Diagnosis not present

## 2018-08-07 DIAGNOSIS — E1142 Type 2 diabetes mellitus with diabetic polyneuropathy: Secondary | ICD-10-CM | POA: Diagnosis not present

## 2018-08-07 DIAGNOSIS — I890 Lymphedema, not elsewhere classified: Secondary | ICD-10-CM | POA: Diagnosis not present

## 2018-08-13 DIAGNOSIS — L89153 Pressure ulcer of sacral region, stage 3: Secondary | ICD-10-CM | POA: Diagnosis not present

## 2018-08-13 DIAGNOSIS — F0390 Unspecified dementia without behavioral disturbance: Secondary | ICD-10-CM | POA: Diagnosis not present

## 2018-08-13 DIAGNOSIS — I890 Lymphedema, not elsewhere classified: Secondary | ICD-10-CM | POA: Diagnosis not present

## 2018-08-13 DIAGNOSIS — I10 Essential (primary) hypertension: Secondary | ICD-10-CM | POA: Diagnosis not present

## 2018-08-13 DIAGNOSIS — E1142 Type 2 diabetes mellitus with diabetic polyneuropathy: Secondary | ICD-10-CM | POA: Diagnosis not present

## 2018-08-13 DIAGNOSIS — Z7984 Long term (current) use of oral hypoglycemic drugs: Secondary | ICD-10-CM | POA: Diagnosis not present

## 2018-08-21 ENCOUNTER — Encounter: Payer: Self-pay | Admitting: Nurse Practitioner

## 2018-08-21 ENCOUNTER — Ambulatory Visit (INDEPENDENT_AMBULATORY_CARE_PROVIDER_SITE_OTHER): Payer: Medicare Other | Admitting: Nurse Practitioner

## 2018-08-21 ENCOUNTER — Other Ambulatory Visit: Payer: Self-pay

## 2018-08-21 DIAGNOSIS — L89153 Pressure ulcer of sacral region, stage 3: Secondary | ICD-10-CM | POA: Diagnosis not present

## 2018-08-21 DIAGNOSIS — I10 Essential (primary) hypertension: Secondary | ICD-10-CM | POA: Diagnosis not present

## 2018-08-21 DIAGNOSIS — E1142 Type 2 diabetes mellitus with diabetic polyneuropathy: Secondary | ICD-10-CM

## 2018-08-21 MED ORDER — LOSARTAN POTASSIUM 100 MG PO TABS: 100.0000 mg | ORAL_TABLET | Freq: Every day | ORAL | 1 refills | Status: DC

## 2018-08-21 NOTE — Progress Notes (Signed)
This service is provided via telemedicine  No vital signs collected/recorded due to the encounter was a telemedicine visit.   Location of patient (ex: home, work):  Home  Patient consents to a telephone visit:  Yes  Location of the provider (ex: office, home):  [employer], Office   Names of all persons participating in the telemedicine service and their role in the encounter:  S.Chrae B/CMA, [name], NP, and Patient   Time spent on call: 10 min with medical assistant   Virtual Visit via Telephone Note  I connected with [name] on 08/21/18 at  2:15 PM EDT by telephone and verified that I am speaking with the correct person using two identifiers.   I discussed the limitations, risks, security and privacy concerns of performing an evaluation and management service by telephone and the availability of in person appointments. I also discussed with the patient that there may be a patient responsible charge related to this service. The patient expressed understanding and agreed to proceed.      Careteam: Patient Care Team: [name], NP as PCP - General (Geriatric Medicine) [name], MD as Consulting Physician (Oncology) [name], [name], LCSW as Social Worker (Licensed Clinical Social Worker) [name], RN as Registered Nurse [facility])  Advanced Directive information    No Known Allergies  Chief Complaint  Patient presents with  . Follow-up    2 week follow-up on blood pressure. B/P elevated in the am despite new regimen      HPI: Patient is a 83 y.o. female to follow up on blood pressure, pressure ulcer and diabetes  htn- clonidine 0.1 mg by mouth ever 8 hours and losartan 50 mg daily. Blood pressures have been ranging-  169/96 (missed mid day clonidine) 142/101 195/115 (possible night reading before am medication) 158/86 187/117  Eating low sodium diet. Eating fresh fruit.   Pressure ulcer  continues to be following with home health overall improving. Will go back and forth. Unable to keep her of of area. Keep her on her back at night because she is more comfortable this way. Aware this does not promote wound healing. Using calcium alginate which has been beneficial.   DM- 147, 135, 147, taking metformin 500 mg by mouth twice daily   Review of Systems:  Review of Systems  Constitutional: Negative for chills, fever and weight loss.  Respiratory: Negative for cough and shortness of breath.   Cardiovascular: Negative for chest pain.  Gastrointestinal: Negative for abdominal pain, diarrhea, heartburn, nausea and vomiting.  Genitourinary: Positive for frequency. Negative for dysuria and urgency.       Incontinence   Musculoskeletal: Negative for myalgias.  Skin:       Stage 3 pressure ulcer.   Psychiatric/Behavioral: Positive for memory loss.    Past Medical History:  Diagnosis Date  . Dementia ([place])   . Diabetes mellitus type II 03/21/2011  . DVT (deep venous thrombosis) ([place]) 03/21/2011  . Hyperlipidemia 03/21/2011  . Hypertension 03/21/2011  . Lymphedema of leg 03/21/2011  . Pulmonary embolism ([place]) 03/21/2011  . Stroke [facility])    Past Surgical History:  Procedure Laterality Date  . CHOLECYSTECTOMY  2015  . SKIN TAG REMOVAL  07/25/2016  . VAGINAL HYSTERECTOMY     unknown of date  . VASCULAR SURGERY     Social History:   reports that she has never smoked. She has never used smokeless tobacco. She reports that she does not drink alcohol  or use drugs.  Family History  Problem Relation Age of Onset  . Heart attack Mother   . COPD Sister   . Stroke Sister   . Bipolar disorder Daughter     Medications: Patient's Medications  New Prescriptions   No medications on file  Previous Medications   ACCU-CHEK SOFTCLIX LANCETS LANCETS    1 each by Other route daily. Use as instructed Dx: E11.9   ACETAMINOPHEN (TYLENOL) 500 MG TABLET    Take 500 mg by mouth as needed  (general pain).    ATORVASTATIN (LIPITOR) 20 MG TABLET    Take 1 tablet (20 mg total) by mouth daily at 6 PM.   CHOLECALCIFEROL (VITAMIN D-3) 125 MCG (5000 UT) TABS    Take 5,000 Units by mouth daily.   CLONIDINE (CATAPRES) 0.1 MG TABLET    Take 1 tablet (0.1 mg total) by mouth every 8 (eight) hours. May take an additional tablet if SBP >170   DICLOFENAC SODIUM (VOLTAREN) 1 % GEL    Apply 1 application topically 2 (two) times daily.    GABAPENTIN (NEURONTIN) 300 MG CAPSULE    Take 1 capsule (300 mg total) by mouth 2 (two) times daily.   GLUCOSE BLOOD (ACCU-CHEK AVIVA PLUS) TEST STRIP    1 each by Other route 2 (two) times daily. E11.9   LOSARTAN (COZAAR) 25 MG TABLET    Take 2 tablets (50 mg total) by mouth daily.   METFORMIN (GLUCOPHAGE) 500 MG TABLET    Twice a day, hold for blood sugar less than 100   POLYETHYLENE GLYCOL (MIRALAX / GLYCOLAX) PACKET    Take 17 g by mouth as needed.   RIVAROXABAN (XARELTO) 15 MG TABS TABLET    Take 1 tablet (15 mg total) by mouth daily with supper.   VITAMINS A & D (VITAMIN A & D) OINTMENT    Apply 1 application topically as needed for dry skin.   WOUND DRESSINGS (MEPILEX EX)    Apply to sacrum and change every 3 days or sooner as needed  Modified Medications   No medications on file  Discontinued Medications   No medications on file     Physical Exam: Unable due to televist   Labs reviewed: Basic Metabolic Panel: Recent Labs    04/18/18 1201 05/15/18 1727 05/15/18 1734 06/18/18 1120  NA 136 136 137 140  K 4.0 4.0 4.1 4.3  CL 97* 102 101 101  CO2 33* 25  --  32  GLUCOSE 179* 225* 227* 215*  BUN 16 15 19  25*  CREATININE 0.44 0.50 0.40* 0.56  CALCIUM 9.1 9.0  --  9.9   Liver Function Tests: Recent Labs    04/18/18 1201 05/15/18 1727 06/18/18 1120  AST 11* 16 12*  ALT 9 11 9   ALKPHOS 53 49 59  BILITOT 0.3 0.6 0.3  PROT 6.4* 6.6 6.6  ALBUMIN 3.5 2.9* 3.7   No results for input(s): LIPASE, AMYLASE in the last 8760 hours. No results  for input(s): AMMONIA in the last 8760 hours. CBC: Recent Labs    04/18/18 1201 05/15/18 1727 05/15/18 1734 06/18/18 1120  WBC 4.3 3.9*  --  5.1  NEUTROABS 2.4 1.7  --  2.9  HGB 10.3* 10.5* 11.9* 11.3*  HCT 34.6* 34.8* 35.0* 36.7  MCV 99.4 96.7  --  95.8  PLT 256 211  --  235   Lipid Panel: Recent Labs    10/18/17 1032  CHOL 150  HDL 40*  LDLCALC 93  TRIG 78  CHOLHDL 3.8   TSH: No results for input(s): TSH in the last 8760 hours. A1C: Lab Results  Component Value Date   HGBA1C 6.1 (H) 04/13/2018     Assessment/Plan 1. Essential hypertension -blood pressures staying elevated despite clonidine and losartan. Continue clonidine TID and Will increase losartan to 100 mg by mouth daily at this time and to continue to monitor. To continue low sodium diet.  - losartan (COZAAR) 100 MG tablet; Take 1 tablet (100 mg total) by mouth daily.  Dispense: 90 tablet; Refill: 1  2. Pressure ulcer of sacral region, stage 3 ([place]) Continues on home health. Again encouraged pressure reduction with frequent turning. Increasing protein in diet. To continue wound care with dressing changes.  3. Type 2 diabetes mellitus with diabetic polyneuropathy, without long-term current use of insulin (HCC) Blood sugars controlled on current regimen, due for follow up A1c. (hopefully can get when she gets labs at the cancer center) Encouraged dietary compliance, routine foot care/monitoring and to keep up with diabetic eye exams through ophthalmology   Next appt: 10/24/2018  Janett Billow K. Harle Battiest  [facility] [phone]    Follow Up Instructions:    I discussed the assessment and treatment plan with the patient. The patient was provided an opportunity to ask questions and all were answered. The patient agreed with the plan and demonstrated an understanding of the instructions.   The patient was advised to call back or seek an in-person evaluation if the symptoms worsen  or if the condition fails to improve as anticipated.  I provided 21 minutes of non-face-to-face time during this encounter.  avs printed and mailed.  [name], NP

## 2018-08-23 DIAGNOSIS — I890 Lymphedema, not elsewhere classified: Secondary | ICD-10-CM | POA: Diagnosis not present

## 2018-08-23 DIAGNOSIS — I10 Essential (primary) hypertension: Secondary | ICD-10-CM | POA: Diagnosis not present

## 2018-08-23 DIAGNOSIS — Z7984 Long term (current) use of oral hypoglycemic drugs: Secondary | ICD-10-CM | POA: Diagnosis not present

## 2018-08-23 DIAGNOSIS — L89153 Pressure ulcer of sacral region, stage 3: Secondary | ICD-10-CM | POA: Diagnosis not present

## 2018-08-23 DIAGNOSIS — F0390 Unspecified dementia without behavioral disturbance: Secondary | ICD-10-CM | POA: Diagnosis not present

## 2018-08-23 DIAGNOSIS — E1142 Type 2 diabetes mellitus with diabetic polyneuropathy: Secondary | ICD-10-CM | POA: Diagnosis not present

## 2018-08-27 DIAGNOSIS — L89153 Pressure ulcer of sacral region, stage 3: Secondary | ICD-10-CM | POA: Diagnosis not present

## 2018-08-27 DIAGNOSIS — I890 Lymphedema, not elsewhere classified: Secondary | ICD-10-CM | POA: Diagnosis not present

## 2018-08-27 DIAGNOSIS — E1142 Type 2 diabetes mellitus with diabetic polyneuropathy: Secondary | ICD-10-CM | POA: Diagnosis not present

## 2018-08-27 DIAGNOSIS — Z7984 Long term (current) use of oral hypoglycemic drugs: Secondary | ICD-10-CM | POA: Diagnosis not present

## 2018-08-27 DIAGNOSIS — I10 Essential (primary) hypertension: Secondary | ICD-10-CM | POA: Diagnosis not present

## 2018-08-27 DIAGNOSIS — F0390 Unspecified dementia without behavioral disturbance: Secondary | ICD-10-CM | POA: Diagnosis not present

## 2018-08-28 DIAGNOSIS — I10 Essential (primary) hypertension: Secondary | ICD-10-CM

## 2018-08-28 DIAGNOSIS — L89153 Pressure ulcer of sacral region, stage 3: Secondary | ICD-10-CM | POA: Diagnosis not present

## 2018-08-28 DIAGNOSIS — Z86711 Personal history of pulmonary embolism: Secondary | ICD-10-CM

## 2018-08-28 DIAGNOSIS — F0390 Unspecified dementia without behavioral disturbance: Secondary | ICD-10-CM | POA: Diagnosis not present

## 2018-08-28 DIAGNOSIS — Z86718 Personal history of other venous thrombosis and embolism: Secondary | ICD-10-CM

## 2018-08-28 DIAGNOSIS — Z7984 Long term (current) use of oral hypoglycemic drugs: Secondary | ICD-10-CM | POA: Diagnosis not present

## 2018-08-28 DIAGNOSIS — E1142 Type 2 diabetes mellitus with diabetic polyneuropathy: Secondary | ICD-10-CM | POA: Diagnosis not present

## 2018-08-28 DIAGNOSIS — I890 Lymphedema, not elsewhere classified: Secondary | ICD-10-CM

## 2018-08-30 ENCOUNTER — Telehealth: Payer: Self-pay | Admitting: *Deleted

## 2018-08-30 NOTE — Telephone Encounter (Signed)
Mel with [facility] called requesting verbal orders for wound care perineal area. Verbal orders given.

## 2018-08-31 ENCOUNTER — Other Ambulatory Visit: Payer: Self-pay

## 2018-08-31 ENCOUNTER — Other Ambulatory Visit: Payer: Medicare Other | Admitting: *Deleted

## 2018-08-31 ENCOUNTER — Other Ambulatory Visit: Payer: Medicare Other | Admitting: Licensed Clinical Social Worker

## 2018-08-31 DIAGNOSIS — Z515 Encounter for palliative care: Secondary | ICD-10-CM

## 2018-08-31 NOTE — Progress Notes (Signed)
COMMUNITY PALLIATIVE CARE RN NOTE  PATIENT NAME: [name] DOB: 10-26-1935 MRN: [id number]  PRIMARY CARE PROVIDER: [name], NP  RESPONSIBLE PARTY:  Acct ID - Guarantor Home Phone Work Phone Relationship Acct Type  [id number] Carolyn, [phone]  Self P/F     [address], [place], [place] [address]   Due to the COVID-19 crisis, this virtual check-in visit was done via telephone from my office and it was initiated and consent by this patient and or family.  PLAN OF CARE and INTERVENTION:  1. ADVANCE CARE PLANNING/GOALS OF CARE: Goal is for patient to remain in her son's home and to avoid hospitalizations. 2. PATIENT/CAREGIVER EDUCATION: Bowel Regimen and Blood Pressure Management 3. DISEASE STATUS: Joint virtual check-in visit made with Palliative Care SW, Lynn [name]. Spoke with patient's daughter in [employer], forensic. Patient currently sitting up in her wheelchair watching TV in the living room with her family. [name] denies any patient discomfort at current time. She reports that she recently had a telehealth visit with her PCP, and patient's Losartan has been increased to 100mg  daily to help with continued elevated BPs. She is giving Losartan and Clonidine in the am. BP is checked again midday and bedtime and Clonidine is given at those times if BP is elevated. She is averaging 2-3 Clonidine tabs daily. BPs have slightly improved. CBGs have been stable. Intake normal. Sacral wounds are improving. Medi-honey now being used. Continues with Wound Care nurse 2x/week. Her intake is normal. Patient has been having recent issues with constipation that has caused some abdominal discomfort. She recently went over a week with no BM, requiring use of an enema with positive results. However, last BM this week was about 4 days ago. She has been giving Miralax daily. I recommended Dulcolax suppositories if no BM in 3 days for her to try. Patient has an appointment with Dr. Marin Olp on Monday for  blood work. Will continue to monitor.  HISTORY OF PRESENT ILLNESS:  This is a 83 yo female who resides at home with her son and daughter in law. Palliative Care Team continues to follow patient. Next visit scheduled in 1 month.   CODE STATUS: DNR ADVANCED DIRECTIVES: Y MOST FORM: no PPS: 30%   (Duration of visit and documentation 45 minutes)    Daryl Eastern, RN BSN

## 2018-09-02 ENCOUNTER — Encounter: Payer: Self-pay | Admitting: Nurse Practitioner

## 2018-09-03 ENCOUNTER — Inpatient Hospital Stay: Payer: Medicare Other | Attending: Hematology & Oncology | Admitting: Hematology & Oncology

## 2018-09-03 ENCOUNTER — Encounter: Payer: Self-pay | Admitting: Hematology & Oncology

## 2018-09-03 ENCOUNTER — Other Ambulatory Visit: Payer: Self-pay

## 2018-09-03 ENCOUNTER — Inpatient Hospital Stay: Payer: Medicare Other

## 2018-09-03 VITALS — BP 202/106 | HR 67 | Temp 98.4°F | Resp 16

## 2018-09-03 DIAGNOSIS — Z7901 Long term (current) use of anticoagulants: Secondary | ICD-10-CM

## 2018-09-03 DIAGNOSIS — Z8673 Personal history of transient ischemic attack (TIA), and cerebral infarction without residual deficits: Secondary | ICD-10-CM | POA: Diagnosis not present

## 2018-09-03 DIAGNOSIS — D500 Iron deficiency anemia secondary to blood loss (chronic): Secondary | ICD-10-CM

## 2018-09-03 DIAGNOSIS — Z86711 Personal history of pulmonary embolism: Secondary | ICD-10-CM

## 2018-09-03 DIAGNOSIS — D509 Iron deficiency anemia, unspecified: Secondary | ICD-10-CM

## 2018-09-03 DIAGNOSIS — Z86718 Personal history of other venous thrombosis and embolism: Secondary | ICD-10-CM

## 2018-09-03 DIAGNOSIS — E1142 Type 2 diabetes mellitus with diabetic polyneuropathy: Secondary | ICD-10-CM

## 2018-09-03 LAB — CBC WITH DIFFERENTIAL (CANCER CENTER ONLY)
Abs Immature Granulocytes: 0.01 10*3/uL (ref 0.00–0.07)
Basophils Absolute: 0 10*3/uL (ref 0.0–0.1)
Basophils Relative: 0 %
Eosinophils Absolute: 0 10*3/uL (ref 0.0–0.5)
Eosinophils Relative: 1 %
HCT: 40.1 % (ref 36.0–46.0)
Hemoglobin: 12.3 g/dL (ref 12.0–15.0)
Immature Granulocytes: 0 %
Lymphocytes Relative: 35 %
Lymphs Abs: 1.5 10*3/uL (ref 0.7–4.0)
MCH: 28.5 pg (ref 26.0–34.0)
MCHC: 30.7 g/dL (ref 30.0–36.0)
MCV: 93 fL (ref 80.0–100.0)
Monocytes Absolute: 0.3 10*3/uL (ref 0.1–1.0)
Monocytes Relative: 8 %
Neutro Abs: 2.4 10*3/uL (ref 1.7–7.7)
Neutrophils Relative %: 56 %
Platelet Count: 219 10*3/uL (ref 150–400)
RBC: 4.31 MIL/uL (ref 3.87–5.11)
RDW: 12.9 % (ref 11.5–15.5)
WBC Count: 4.3 10*3/uL (ref 4.0–10.5)
nRBC: 0 % (ref 0.0–0.2)

## 2018-09-03 LAB — LIPID PANEL
Cholesterol: 173 mg/dL (ref 0–200)
HDL: 52 mg/dL (ref >40)
LDL Cholesterol: 103 mg/dL — ABNORMAL HIGH (ref 0–99)
Total CHOL/HDL Ratio: 3.3 RATIO
Triglycerides: 90 mg/dL (ref <150)
VLDL: 18 mg/dL (ref 0–40)

## 2018-09-03 LAB — CMP (CANCER CENTER ONLY)
ALT: 9 U/L (ref 0–44)
AST: 11 U/L — ABNORMAL LOW (ref 15–41)
Albumin: 4.1 g/dL (ref 3.5–5.0)
Alkaline Phosphatase: 71 U/L (ref 38–126)
Anion gap: 8 (ref 5–15)
BUN: 18 mg/dL (ref 8–23)
CO2: 33 mmol/L — ABNORMAL HIGH (ref 22–32)
Calcium: 10.4 mg/dL — ABNORMAL HIGH (ref 8.9–10.3)
Chloride: 98 mmol/L (ref 98–111)
Creatinine: 0.49 mg/dL (ref 0.44–1.00)
GFR, Est AFR Am: >60 mL/min (ref >60)
GFR, Estimated: >60 mL/min (ref >60)
Glucose, Bld: 187 mg/dL — ABNORMAL HIGH (ref 70–99)
Potassium: 4.4 mmol/L (ref 3.5–5.1)
Sodium: 139 mmol/L (ref 135–145)
Total Bilirubin: 0.4 mg/dL (ref 0.3–1.2)
Total Protein: 7.5 g/dL (ref 6.5–8.1)

## 2018-09-03 LAB — RETICULOCYTES
Immature Retic Fract: 10.6 % (ref 2.3–15.9)
RBC.: 4.23 MIL/uL (ref 3.87–5.11)
Retic Count, Absolute: 59.2 10*3/uL (ref 19.0–186.0)
Retic Ct Pct: 1.4 % (ref 0.4–3.1)

## 2018-09-03 LAB — HEMOGLOBIN A1C
Hgb A1c MFr Bld: 8.2 % — ABNORMAL HIGH (ref 4.8–5.6)
Mean Plasma Glucose: 188.64 mg/dL

## 2018-09-03 NOTE — Progress Notes (Signed)
Assesment done with daughter's help.

## 2018-09-03 NOTE — Progress Notes (Signed)
Hematology and Oncology Follow Up Visit  [name] [id number] 15-Nov-1935 83 y.o. 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  So far, she is doing fairly well.  Her blood pressure has been on the high side.  I did a blood pressure recheck with a manual cuff.  The blood pressure was 180/90.  Her caregiver said that the losartan dose was just increased last week.  Her hemoglobin is doing better.  She is doing well without any IV iron.  I know that she does have intermittent blood loss because of the Xarelto.  She is making it through the coronavirus.  There is no issues with nausea or vomiting.    Her performance status is ECOG 3.     Medications:  Allergies as of 09/03/2018   No Known Allergies     Medication List       Accurate as of September 03, 2018 12:47 PM. Always use your most recent med list.        Accu-Chek Softclix Lancets lancets 1 each by Other route daily. Use as instructed Dx: E11.9   acetaminophen 500 MG tablet Commonly known as:  TYLENOL Take 500 mg by mouth as needed (general pain).   atorvastatin 20 MG tablet Commonly known as:  LIPITOR Take 1 tablet (20 mg total) by mouth daily at 6 PM.   cloNIDine 0.1 MG tablet Commonly known as:  CATAPRES Take 1 tablet (0.1 mg total) by mouth every 8 (eight) hours. May take an additional tablet if SBP >170   diclofenac sodium 1 % Gel Commonly known as:  VOLTAREN Apply 1 application topically 2 (two) times daily.   gabapentin 300 MG capsule Commonly known as:  NEURONTIN Take 1 capsule (300 mg total) by mouth 2 (two) times daily.   glucose blood test strip Commonly known as:  Accu-Chek Aviva Plus  1 each by Other route 2 (two) times daily. E11.9   losartan 100 MG tablet Commonly known as:  COZAAR Take 1 tablet (100 mg total) by mouth daily.   [place] EX Apply to sacrum and change every 3 days or sooner as needed   metFORMIN 500 MG tablet Commonly known as:  GLUCOPHAGE Twice a day, hold for blood sugar less than 100   polyethylene glycol 17 g packet Commonly known as:  MIRALAX / GLYCOLAX Take 17 g by mouth as needed.   Rivaroxaban 15 MG Tabs tablet Commonly known as:  Xarelto Take 1 tablet (15 mg total) by mouth daily with supper.   vitamin A & D ointment Apply 1 application topically as needed for dry skin.   Vitamin D-3 125 MCG (5000 UT) Tabs Take 5,000 Units by mouth daily.       Allergies: No Known Allergies  Past Medical History, Surgical history, Social history, and Family History were reviewed and updated.  Review of Systems: Review of Systems  Constitutional: Positive for malaise/fatigue.  HENT: Negative.   Eyes: Negative.   Respiratory: Positive for shortness of breath.   Cardiovascular: Positive for palpitations and leg swelling.  Gastrointestinal: Positive for abdominal pain, constipation and nausea.  Genitourinary: Positive for frequency.  Musculoskeletal: Positive for joint pain and myalgias.  Skin: Negative.  Neurological: Positive for dizziness and focal weakness.  Endo/Heme/Allergies: Negative.   Psychiatric/Behavioral: Negative.      Physical Exam:  oral temperature is 98.4 F (36.9 C). Her blood pressure is 202/106 (abnormal) and her pulse is 67. Her respiration is 16 and oxygen saturation is 100%.   Wt Readings from Last 3 Encounters:  01/30/18 130 lb (59 kg)  08/07/17 130 lb (59 kg)  04/20/17 128 lb (58.1 kg)    Physical Exam Vitals signs reviewed.  HENT:     Head: Normocephalic and atraumatic.  Eyes:     Pupils: Pupils are equal, round, and reactive to light.  Neck:     Musculoskeletal: Normal range of motion.   Cardiovascular:     Rate and Rhythm: Normal rate and regular rhythm.     Heart sounds: Normal heart sounds.  Pulmonary:     Effort: Pulmonary effort is normal.     Breath sounds: Normal breath sounds.  Abdominal:     General: Bowel sounds are normal.     Palpations: Abdomen is soft.  Musculoskeletal: Normal range of motion.        General: No tenderness or deformity.  Lymphadenopathy:     Cervical: No cervical adenopathy.  Skin:    General: Skin is warm and dry.     Findings: No erythema or rash.  Neurological:     Mental Status: She is alert and oriented to person, place, and time.  Psychiatric:        Behavior: Behavior normal.        Thought Content: Thought content normal.        Judgment: Judgment normal.      Lab Results  Component Value Date   WBC 4.3 09/03/2018   HGB 12.3 09/03/2018   HCT 40.1 09/03/2018   MCV 93.0 09/03/2018   PLT 219 09/03/2018   Lab Results  Component Value Date   FERRITIN 68 06/18/2018   IRON 44 06/18/2018   TIBC 221 (L) 06/18/2018   UIBC 177 06/18/2018   IRONPCTSAT 20 (L) 06/18/2018   Lab Results  Component Value Date   RETICCTPCT 1.4 09/03/2018   RBC 4.23 09/03/2018   No results found for: KPAFRELGTCHN, LAMBDASER, KAPLAMBRATIO No results found for: IGGSERUM, IGA, IGMSERUM No results found for: Odetta Pink, SPEI   Chemistry      Component Value Date/Time   NA 139 09/03/2018 1143   NA 142 02/28/2017 1059   K 4.4 09/03/2018 1143   K 3.8 02/28/2017 1059   CL 98 09/03/2018 1143   CL 102 02/28/2017 1059   CO2 33 (H) 09/03/2018 1143   CO2 30 02/28/2017 1059   BUN 18 09/03/2018 1143   BUN 16 02/28/2017 1059   CREATININE 0.49 09/03/2018 1143   CREATININE 0.37 (L) 04/13/2018 1106   GLU 140 03/30/2016      Component Value Date/Time   CALCIUM 10.4 (H) 09/03/2018 1143   CALCIUM 9.0 02/28/2017 1059   ALKPHOS 71 09/03/2018 1143   ALKPHOS 68 02/28/2017 1059   AST 11 (L)  09/03/2018 1143   ALT 9 09/03/2018 1143   ALT 14 02/28/2017 1059   BILITOT 0.4 09/03/2018 1143      Impression and Plan: [name] is a very pleasant 83 yo African American female with history of PE and lower extremity DVT.  We probably need to get her back in about 3 months or so.    I just want to make sure that  we are  vigilant in following her hemoglobin so that she does not drop too low.     [name], MD 4/27/202012:47 PM

## 2018-09-03 NOTE — Progress Notes (Signed)
COMMUNITY PALLIATIVE CARE SW NOTE  PATIENT NAME: [name] DOB: Oct 02, 1935 MRN: [id number]  PRIMARY CARE PROVIDER: [name], NP  RESPONSIBLE PARTY:  Acct ID - Guarantor Home Phone Work Phone Relationship Acct Type  [id number] [name], [phone]  Self P/F     [address], [place], [place] [address]  Due to the COVID-19 crisis, this virtual check-in visit was done via telephone from my office and it was initiated and consent given by this patient and or family.   PLAN OF CARE and INTERVENTIONS:             1. GOALS OF CARE/ ADVANCE CARE PLANNING:  The goal is for patient to remain in her son's home and extended family.  She is a DNR. 2. SOCIAL/EMOTIONAL/SPIRITUAL ASSESSMENT/ INTERVENTIONS:  SW and Palliative Care RN, [name], conducted a joint virtual check-in visit with patient's daughter-in-law, [name].  Patient was in her w/c in the living room watching tv with other family members.  Patient wound appears better per [name] and a wound nurse continues home visits.  Provided active listening and supportive counseling. 3. PATIENT/CAREGIVER EDUCATION/ COPING:  Provided education regarding virtual check-in visits.  [name] stated she understood. 4. PERSONAL EMERGENCY PLAN:  [name] is very attentive to patient's needs and will place her in bed as needed.  EMS is contacted for emergencies. 5. COMMUNITY RESOURCES COORDINATION/ HEALTH CARE NAVIGATION:  Patient has family or caregivers with her 24/7. 6. FINANCIAL/LEGAL CONCERNS/INTERVENTIONS:  None.     SOCIAL HX:  Social History   Tobacco Use  . Smoking status: Never Smoker  . Smokeless tobacco: Never Used  Substance Use Topics  . Alcohol use: No    CODE STATUS:  DNR  ADVANCED DIRECTIVES:  LW and HCPOA MOST FORM COMPLETE:  No HOSPICE EDUCATION PROVIDED:  No PPS:  Patient's appetite remains normal.  Patient is dependent for all of her ADLs.   Duration of visit and documentation:  40  minutes.      Carolyn Corn Djuana [place], LCSW

## 2018-09-04 ENCOUNTER — Telehealth: Payer: Self-pay | Admitting: *Deleted

## 2018-09-04 ENCOUNTER — Telehealth: Payer: Self-pay

## 2018-09-04 LAB — IRON AND TIBC
Iron: 50 ug/dL (ref 41–142)
Saturation Ratios: 20 % — ABNORMAL LOW (ref 21–57)
TIBC: 250 ug/dL (ref 236–444)
UIBC: 200 ug/dL (ref 120–384)

## 2018-09-04 LAB — FERRITIN: Ferritin: 61 ng/mL (ref 11–307)

## 2018-09-04 LAB — ERYTHROPOIETIN: Erythropoietin: 11.5 m[IU]/mL (ref 2.6–18.5)

## 2018-09-04 NOTE — Telephone Encounter (Signed)
Notified pt of lab results, and to expect a call from scheduling with appt date/time. No further concerns.

## 2018-09-04 NOTE — Telephone Encounter (Signed)
-----   Message from [name], MD sent at 09/04/2018  9:52 AM EDT ----- Call - her iron is a little low!!  Please have her come in for a dose of Feraheme1!  Laurey Arrow

## 2018-09-04 NOTE — Telephone Encounter (Signed)
Called patient's daughter [name], [place] to return call about patient's blood pressure readings and possibly schedule a tele -visit to discuss with J. Eubanks.

## 2018-09-10 ENCOUNTER — Ambulatory Visit (INDEPENDENT_AMBULATORY_CARE_PROVIDER_SITE_OTHER): Payer: Medicare Other | Admitting: Nurse Practitioner

## 2018-09-10 ENCOUNTER — Other Ambulatory Visit: Payer: Self-pay

## 2018-09-10 ENCOUNTER — Encounter: Payer: Self-pay | Admitting: Nurse Practitioner

## 2018-09-10 ENCOUNTER — Ambulatory Visit: Payer: Self-pay | Admitting: Nurse Practitioner

## 2018-09-10 DIAGNOSIS — E1142 Type 2 diabetes mellitus with diabetic polyneuropathy: Secondary | ICD-10-CM

## 2018-09-10 DIAGNOSIS — Z20828 Contact with and (suspected) exposure to other viral communicable diseases: Secondary | ICD-10-CM | POA: Diagnosis not present

## 2018-09-10 DIAGNOSIS — I10 Essential (primary) hypertension: Secondary | ICD-10-CM | POA: Diagnosis not present

## 2018-09-10 DIAGNOSIS — Z20822 Contact with and (suspected) exposure to covid-19: Secondary | ICD-10-CM

## 2018-09-10 MED ORDER — METFORMIN HCL 500 MG PO TABS: ORAL_TABLET | ORAL | 1 refills | Status: DC

## 2018-09-10 NOTE — Patient Instructions (Addendum)
Continue metformin 1000 mg by mouth in the morning and 500 mg at dinner  To increase losartan to 100 mg by mouth daily  To try to take the clonidine every 8 hours for proper blood pressure control.   To call if regimen is not controlling blood pressure or there is an increase in side effects.   Keep follow ups as scheduled.

## 2018-09-10 NOTE — Progress Notes (Signed)
This service is provided via telemedicine  No vital signs collected/recorded due to the encounter was a telemedicine visit.   Location of patient (ex: home, work):Home    Patient consents to a telephone visit: Yes   Location of the provider (ex: office, home): [facility]  Name of any referring provider:Jannel Lynne NP   Names of all persons participating in the telemedicine service and their role in the encounter:[name], Patient [name], daughter in law [name]  Time spent on call: Contreras spent 9 min  Patient has an appointment to follow up with blood pressure [name] would like to discuss decreasing losartan,Patients daughter [name] states their aide was exposed to [place] 19 and will not be in to help with [name],Patient does not have SOB,denies diarrhea, no temperature  Virtual Visit via Telephone Note  I connected with [name] on 09/10/18 at  1:30 PM EDT by telephone and verified that I am speaking with the correct person using two identifiers.  Location: Patient: home Provider: office    I discussed the limitations, risks, security and privacy concerns of performing an evaluation and management service by telephone and the availability of in person appointments. I also discussed with the patient that there may be a patient responsible charge related to this service. The patient expressed understanding and agreed to proceed.      Careteam: Patient Care Team: [name], NP as PCP - General (Geriatric Medicine) [name], MD as Consulting Physician (Oncology) [name], [name], LCSW as Social Worker (Licensed Clinical Social Worker) [name], RN as Registered Nurse ([facility] [place])  Advanced Directive information Does Patient Have a Medical Advance Directive?: Yes, Type of Advance Directive: Healthcare Power of Attorney, Does patient want to make changes to medical advance directive?: No -  Patient declined  No Known Allergies  Chief Complaint  Patient presents with  . Acute Visit    discuss blood pressure elevated,exposed to COVID 19  . Health Maintenance    due for foot exam and eye exam      HPI: Patient is a 83 y.o. female due to high blood pressure.   htn- she has been giving losartan 75 mg instead of losartan 100 mg (which was recently increase) Reports heart rate was in the upper 50s per bp machine, daughter-in-law manually check it and it was in the 78s.  Also doing clonidine 0.1 mg by mouth twice daily. - does not like giving it to her first thing in the morning because it makes her groggy. (feels like the medication making her blood pressure drop makes her sleepy)  Usually gives her medication after her eats- which is around 11 am blood pressure after medication-  160/105 HR 80  174/104 HR 64 172/86 HR 70 158/88 HR 60   Caregiver was exposed on Thursday last week (no symptoms), she was around them on Friday. She was fine when they were together then woke up the next day with symptoms and was tested and was positive.  It has been 3 days and everyone is so far so good. No symptoms. They are monitoring temperatures and symptoms.  Review of Systems:  Review of Systems  Unable to perform ROS: Dementia    Past Medical History:  Diagnosis Date  . Dementia ([place])   . Diabetes mellitus type II 03/21/2011  . DVT (deep venous thrombosis) ([place]) 03/21/2011  . Hyperlipidemia 03/21/2011  . Hypertension 03/21/2011  . Lymphedema of leg 03/21/2011  .  Pulmonary embolism ([place]) 03/21/2011  . Stroke [facility])    Past Surgical History:  Procedure Laterality Date  . CHOLECYSTECTOMY  2015  . SKIN TAG REMOVAL  07/25/2016  . VAGINAL HYSTERECTOMY     unknown of date  . VASCULAR SURGERY     Social History:   reports that she has never smoked. She has never used smokeless tobacco. She reports that she does not drink alcohol or use drugs.  Family History  Problem Relation  Age of Onset  . Heart attack Mother   . COPD Sister   . Stroke Sister   . Bipolar disorder Daughter     Medications: Patient's Medications  New Prescriptions   No medications on file  Previous Medications   ACCU-CHEK SOFTCLIX LANCETS LANCETS    1 each by Other route daily. Use as instructed Dx: E11.9   ACETAMINOPHEN (TYLENOL) 500 MG TABLET    Take 500 mg by mouth as needed (general pain).    ATORVASTATIN (LIPITOR) 20 MG TABLET    Take 1 tablet (20 mg total) by mouth daily at 6 PM.   CHOLECALCIFEROL (VITAMIN D-3) 125 MCG (5000 UT) TABS    Take 5,000 Units by mouth daily.   CLONIDINE (CATAPRES) 0.1 MG TABLET    Take 1 tablet (0.1 mg total) by mouth every 8 (eight) hours. May take an additional tablet if SBP >170   DICLOFENAC SODIUM (VOLTAREN) 1 % GEL    Apply 1 application topically 2 (two) times daily.    GABAPENTIN (NEURONTIN) 300 MG CAPSULE    Take 1 capsule (300 mg total) by mouth 2 (two) times daily.   GLUCOSE BLOOD (ACCU-CHEK AVIVA PLUS) TEST STRIP    1 each by Other route 2 (two) times daily. E11.9   LOSARTAN (COZAAR) 100 MG TABLET    Take 1 tablet (100 mg total) by mouth daily.   METFORMIN (GLUCOPHAGE) 500 MG TABLET    Twice a day, hold for blood sugar less than 100   POLYETHYLENE GLYCOL (MIRALAX / GLYCOLAX) PACKET    Take 17 g by mouth as needed.   RIVAROXABAN (XARELTO) 15 MG TABS TABLET    Take 1 tablet (15 mg total) by mouth daily with supper.   VITAMINS A & D (VITAMIN A & D) OINTMENT    Apply 1 application topically as needed for dry skin.   WOUND DRESSINGS ([place] EX)    Apply to sacrum and change every 3 days or sooner as needed  Modified Medications   No medications on file  Discontinued Medications   No medications on file     Physical Exam: unable due to tele-visit.    Labs reviewed: Basic Metabolic Panel: Recent Labs    05/15/18 1727 05/15/18 1734 06/18/18 1120 09/03/18 1143  NA 136 137 140 139  K 4.0 4.1 4.3 4.4  CL 102 101 101 98  CO2 25  --  32 33*   GLUCOSE 225* 227* 215* 187*  BUN 15 19 25* 18  CREATININE 0.50 0.40* 0.56 0.49  CALCIUM 9.0  --  9.9 10.4*   Liver Function Tests: Recent Labs    05/15/18 1727 06/18/18 1120 09/03/18 1143  AST 16 12* 11*  ALT 11 9 9   ALKPHOS 49 59 71  BILITOT 0.6 0.3 0.4  PROT 6.6 6.6 7.5  ALBUMIN 2.9* 3.7 4.1   No results for input(s): LIPASE, AMYLASE in the last 8760 hours. No results for input(s): AMMONIA in the last 8760 hours. CBC: Recent Labs  05/15/18 1727 05/15/18 1734 06/18/18 1120 09/03/18 1143  WBC 3.9*  --  5.1 4.3  NEUTROABS 1.7  --  2.9 2.4  HGB 10.5* 11.9* 11.3* 12.3  HCT 34.8* 35.0* 36.7 40.1  MCV 96.7  --  95.8 93.0  PLT 211  --  235 219   Lipid Panel: Recent Labs    10/18/17 1032 09/03/18 1143  CHOL 150 173  HDL 40* 52  LDLCALC 93 103*  TRIG 78 90  CHOLHDL 3.8 3.3   TSH: No results for input(s): TSH in the last 8760 hours. A1C: Lab Results  Component Value Date   HGBA1C 8.2 (H) 09/03/2018     Assessment/Plan 1. Essential hypertension -daughter in law giving first medication of the day at 11 am and only giving 2 doses of clonidine. Has decreased losartan to 75 mg daily.  -take blood pressure frequently prior to medication administration.  discussing changing medication regimen to help with compliance but daugher- in -law states they will start giving AM medication earlier to get in 3 doses of clonidine.  -to increase losartan and continue to monitor.    2. Type 2 diabetes mellitus with diabetic polyneuropathy, without long-term current use of insulin (HCC) -a1c currently 8.2 up from 6.1. daughter in law is getting her metformin 500 mg by mouth twice daily. If her blood sugar is elevated over 150 she will give her metformin 1000 mg in the morning and 500 in the evening.  Will eat waffles for breakfast (sugar free) in the past but now using jelly.  Has liberalized diet.  - metFORMIN (GLUCOPHAGE) 500 MG tablet; 1000 mg by mouth with breakfast and 500  mg by mouth with Dinner  Dispense: 270 tablet; Refill: 1  3. Exposure to Covid-19 Virus -her aid was exposed to COVID-19 however aid was and is currently asymptomatic. Instructed daughter-in-law to continue to monitor for symptoms at this time and to continue to self isolate for 14 days incase they develop symptoms   Next appt: 10/24/2018 Carlos American. Harle Battiest  [facility] [phone]   Follow Up Instructions:    I discussed the assessment and treatment plan with the patient. The patient was provided an opportunity to ask questions and all were answered. The patient agreed with the plan and demonstrated an understanding of the instructions.   The patient was advised to call back or seek an in-person evaluation if the symptoms worsen or if the condition fails to improve as anticipated.  I provided 29 minutes of non-face-to-face time during this encounter.   [name], NP   avs printed and mailed.

## 2018-09-25 ENCOUNTER — Telehealth: Payer: Self-pay | Admitting: *Deleted

## 2018-09-25 NOTE — Telephone Encounter (Signed)
Moe with [facility] called and wanted verbal order for Speech Therapy. Verbal Order given.

## 2018-09-27 ENCOUNTER — Telehealth: Payer: Self-pay | Admitting: Nurse Practitioner

## 2018-09-27 DIAGNOSIS — Z86711 Personal history of pulmonary embolism: Secondary | ICD-10-CM | POA: Diagnosis not present

## 2018-09-27 DIAGNOSIS — Z86718 Personal history of other venous thrombosis and embolism: Secondary | ICD-10-CM | POA: Diagnosis not present

## 2018-09-27 DIAGNOSIS — F0390 Unspecified dementia without behavioral disturbance: Secondary | ICD-10-CM | POA: Diagnosis not present

## 2018-09-27 DIAGNOSIS — L89153 Pressure ulcer of sacral region, stage 3: Secondary | ICD-10-CM | POA: Diagnosis not present

## 2018-09-27 DIAGNOSIS — E1142 Type 2 diabetes mellitus with diabetic polyneuropathy: Secondary | ICD-10-CM | POA: Diagnosis not present

## 2018-09-27 DIAGNOSIS — Z8673 Personal history of transient ischemic attack (TIA), and cerebral infarction without residual deficits: Secondary | ICD-10-CM | POA: Diagnosis not present

## 2018-09-27 DIAGNOSIS — I890 Lymphedema, not elsewhere classified: Secondary | ICD-10-CM | POA: Diagnosis not present

## 2018-09-27 DIAGNOSIS — I10 Essential (primary) hypertension: Secondary | ICD-10-CM | POA: Diagnosis not present

## 2018-09-27 DIAGNOSIS — Z7984 Long term (current) use of oral hypoglycemic drugs: Secondary | ICD-10-CM | POA: Diagnosis not present

## 2018-09-27 NOTE — Telephone Encounter (Signed)
Johnson notified. LMOM of Providers response.

## 2018-09-27 NOTE — Telephone Encounter (Signed)
Okay to move speech appt to next week

## 2018-09-27 NOTE — Telephone Encounter (Signed)
Okay to move speech therapy appt to next week.

## 2018-09-28 ENCOUNTER — Other Ambulatory Visit: Payer: Medicare Other | Admitting: *Deleted

## 2018-09-28 ENCOUNTER — Other Ambulatory Visit: Payer: Self-pay

## 2018-09-28 ENCOUNTER — Other Ambulatory Visit: Payer: Medicare Other | Admitting: Licensed Clinical Social Worker

## 2018-09-28 DIAGNOSIS — Z515 Encounter for palliative care: Secondary | ICD-10-CM

## 2018-09-28 NOTE — Progress Notes (Signed)
COMMUNITY PALLIATIVE CARE SW NOTE  PATIENT NAME: [name] DOB: January 26, 1936 MRN: [id number]  PRIMARY CARE PROVIDER: [name], NP  RESPONSIBLE PARTY:  Acct ID - Guarantor Home Phone Work Phone Relationship Acct Type  [id number] DERIYAH, [name]  Self P/F     [address], [place], [place] [address]   Due to the COVID-19 crisis, this virtual check-in visit was done via telephone from my office and it was initiated and consent given by this patient and or family.   PLAN OF CARE and INTERVENTIONS:             1. GOALS OF CARE/ ADVANCE CARE PLANNING:  Goal for patient is to remain in her son's home.  Patient is a DNR. 2. SOCIAL/EMOTIONAL/SPIRITUAL ASSESSMENT/ INTERVENTIONS:  SW and Palliative Care RN, [name], conducted a virtual check-in visit with patients daughter-in-law [name].  SW provided active listening and positive encouragement as she discussed not having CNA assistance for two weeks.  Patient is coughing more in the morning.  The swelling in patient's feed are down and not painful.  [name] reported patient's weight loss. 3. PATIENT/CAREGIVER EDUCATION/ COPING:  Patient's primary caregiver expresses her feelings openly. 4. PERSONAL EMERGENCY PLAN:  EMS is contacted for emergencies. 5. COMMUNITY RESOURCES COORDINATION/ HEALTH CARE NAVIGATION:  Patient has 24/7 hired caregivers. 6. FINANCIAL/LEGAL CONCERNS/INTERVENTIONS:  None.     SOCIAL HX:  Social History   Tobacco Use  . Smoking status: Never Smoker  . Smokeless tobacco: Never Used  Substance Use Topics  . Alcohol use: No    CODE STATUS:  DNR  ADVANCED DIRECTIVES: LW and HCPOA MOST FORM COMPLETE:  No HOSPICE EDUCATION PROVIDED: No PPS:  Patient's appetite is normal.  She is dependent for all ADLs. Duration of visit and documentation:  60 minutes.      Carolyn Corn Cherisse Carrell, LCSW

## 2018-09-28 NOTE — Progress Notes (Signed)
COMMUNITY PALLIATIVE CARE RN NOTE  PATIENT NAME: [name] DOB: [birth year] MRN: [id number]  PRIMARY CARE PROVIDER: [name], NP  RESPONSIBLE PARTY:  Acct ID - Guarantor Home Phone Work Phone Relationship Acct Type  [id number] ARLETT, [phone]  Self P/F     [address], [place], [place] [address]   Due to the COVID-19 crisis, this virtual check-in visit was done via telephone from my office and it was initiated and consent by this patient and or family.  PLAN OF CARE and INTERVENTION:  1. ADVANCE CARE PLANNING/GOALS OF CARE: Goal is for patient to remain at home with her family. She is a DNR. 2. PATIENT/CAREGIVER EDUCATION: Aspiration Precautions, Management of Skin Breakdown and blood pressure 3. DISEASE STATUS: Joint virtual check-in visit completed with Palliative Care SW, Lynn [name]. Patient denies pain at this time. [name] [name] reports that patient has been ok this week. She has noticed some patient coughing, usually occurring in the am when she first wakes up. [name] says she seems to get strangled on her own saliva/secretions. If patient starts to cough on regular foods, she will then try softer foods such as yogurt which she seems to do well with. Lunch and dinner meals seem to be better as far as intake and less coughing even with eating more solid/textured foods. Medications being given crushed or cut in half in yogurt or applesauce. The current home health agency ([facility]) has obtained an order for a Speech Therapy consult. Home health RN continues to visit twice weekly for wound care. [name] has noticed that patient has lost some weight and appears thinner overall. She is unable to weigh patient due to her inability to stand. She states that intake has been variable. She does require feeding most of the time and remains total care with all ADLs. She continues with some elevated BPs at times. This am 171/109 and yesterday 168/90. Two days prior, BP  was 200s/100s. HR has ranged from the 70s-80s. She is taking Losartan 75 mg daily and Clonidine 2-3 times per day. She is incontinent of both bowel and bladder. Constipation issues have resolved. Activia being given daily along with Colace. Edema present in bilateral lower extremities, but improved. Will continue to monitor.  HISTORY OF PRESENT ILLNESS:  This is a 83 yo female who resides at home with her son and daughter in law.. Palliative Care Team continues to follow patient. Will continue to visit monthly and PRN.  CODE STATUS: DNR  ADVANCED DIRECTIVES: Y MOST FORM: no PPS: 30%   (Duration of visit and documentation 45 minutes)   Daryl Eastern, RN BSN

## 2018-10-02 DIAGNOSIS — F0390 Unspecified dementia without behavioral disturbance: Secondary | ICD-10-CM | POA: Diagnosis not present

## 2018-10-02 DIAGNOSIS — E1142 Type 2 diabetes mellitus with diabetic polyneuropathy: Secondary | ICD-10-CM | POA: Diagnosis not present

## 2018-10-02 DIAGNOSIS — L89153 Pressure ulcer of sacral region, stage 3: Secondary | ICD-10-CM | POA: Diagnosis not present

## 2018-10-02 DIAGNOSIS — I10 Essential (primary) hypertension: Secondary | ICD-10-CM | POA: Diagnosis not present

## 2018-10-02 DIAGNOSIS — I890 Lymphedema, not elsewhere classified: Secondary | ICD-10-CM | POA: Diagnosis not present

## 2018-10-02 DIAGNOSIS — Z7984 Long term (current) use of oral hypoglycemic drugs: Secondary | ICD-10-CM | POA: Diagnosis not present

## 2018-10-04 DIAGNOSIS — F0390 Unspecified dementia without behavioral disturbance: Secondary | ICD-10-CM | POA: Diagnosis not present

## 2018-10-04 DIAGNOSIS — E1142 Type 2 diabetes mellitus with diabetic polyneuropathy: Secondary | ICD-10-CM | POA: Diagnosis not present

## 2018-10-04 DIAGNOSIS — I10 Essential (primary) hypertension: Secondary | ICD-10-CM | POA: Diagnosis not present

## 2018-10-04 DIAGNOSIS — I890 Lymphedema, not elsewhere classified: Secondary | ICD-10-CM | POA: Diagnosis not present

## 2018-10-04 DIAGNOSIS — Z7984 Long term (current) use of oral hypoglycemic drugs: Secondary | ICD-10-CM | POA: Diagnosis not present

## 2018-10-04 DIAGNOSIS — L89153 Pressure ulcer of sacral region, stage 3: Secondary | ICD-10-CM | POA: Diagnosis not present

## 2018-10-05 DIAGNOSIS — I10 Essential (primary) hypertension: Secondary | ICD-10-CM | POA: Diagnosis not present

## 2018-10-05 DIAGNOSIS — F0390 Unspecified dementia without behavioral disturbance: Secondary | ICD-10-CM | POA: Diagnosis not present

## 2018-10-05 DIAGNOSIS — Z7984 Long term (current) use of oral hypoglycemic drugs: Secondary | ICD-10-CM | POA: Diagnosis not present

## 2018-10-05 DIAGNOSIS — L89153 Pressure ulcer of sacral region, stage 3: Secondary | ICD-10-CM | POA: Diagnosis not present

## 2018-10-05 DIAGNOSIS — E1142 Type 2 diabetes mellitus with diabetic polyneuropathy: Secondary | ICD-10-CM | POA: Diagnosis not present

## 2018-10-05 DIAGNOSIS — I890 Lymphedema, not elsewhere classified: Secondary | ICD-10-CM | POA: Diagnosis not present

## 2018-10-09 DIAGNOSIS — L89153 Pressure ulcer of sacral region, stage 3: Secondary | ICD-10-CM | POA: Diagnosis not present

## 2018-10-09 DIAGNOSIS — E1142 Type 2 diabetes mellitus with diabetic polyneuropathy: Secondary | ICD-10-CM | POA: Diagnosis not present

## 2018-10-09 DIAGNOSIS — I10 Essential (primary) hypertension: Secondary | ICD-10-CM | POA: Diagnosis not present

## 2018-10-09 DIAGNOSIS — I890 Lymphedema, not elsewhere classified: Secondary | ICD-10-CM | POA: Diagnosis not present

## 2018-10-09 DIAGNOSIS — F0390 Unspecified dementia without behavioral disturbance: Secondary | ICD-10-CM | POA: Diagnosis not present

## 2018-10-09 DIAGNOSIS — Z7984 Long term (current) use of oral hypoglycemic drugs: Secondary | ICD-10-CM | POA: Diagnosis not present

## 2018-10-12 DIAGNOSIS — I890 Lymphedema, not elsewhere classified: Secondary | ICD-10-CM | POA: Diagnosis not present

## 2018-10-12 DIAGNOSIS — E1142 Type 2 diabetes mellitus with diabetic polyneuropathy: Secondary | ICD-10-CM | POA: Diagnosis not present

## 2018-10-12 DIAGNOSIS — L89153 Pressure ulcer of sacral region, stage 3: Secondary | ICD-10-CM | POA: Diagnosis not present

## 2018-10-12 DIAGNOSIS — Z7984 Long term (current) use of oral hypoglycemic drugs: Secondary | ICD-10-CM | POA: Diagnosis not present

## 2018-10-12 DIAGNOSIS — F0390 Unspecified dementia without behavioral disturbance: Secondary | ICD-10-CM | POA: Diagnosis not present

## 2018-10-12 DIAGNOSIS — I10 Essential (primary) hypertension: Secondary | ICD-10-CM | POA: Diagnosis not present

## 2018-10-14 IMAGING — CT CT HEAD W/O CM
3 of 4 series · 15 of 47 positions shown, 18 images · non-contrast
Comparison: None.

CLINICAL DATA: Left-sided weakness with drooling and slurred
speech.

EXAM:
CT HEAD WITHOUT CONTRAST
TECHNIQUE: Contiguous axial images were obtained from the base of the skull
through the vertex without intravenous contrast.

[Series 2: head 5.0 h30s · axial · 0.42mm/px · z∈[-145,-25]mm · 9 of 31 slices shown, 12 images]
[im 4/31  brain]
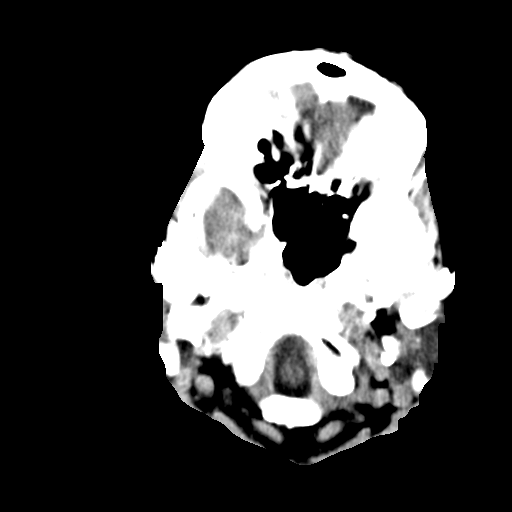
[im 4/31  bone]
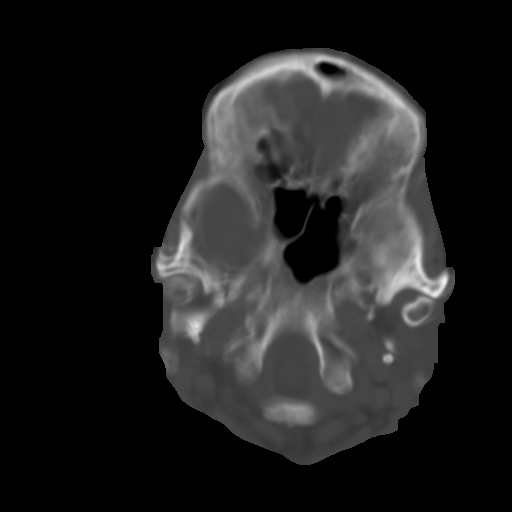
[im 7/31  brain]
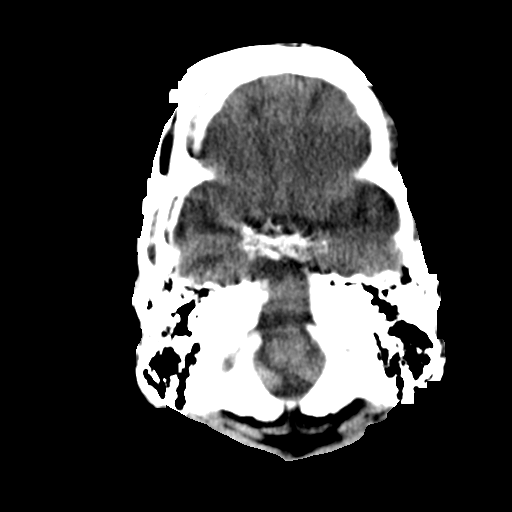
[im 10/31  brain]
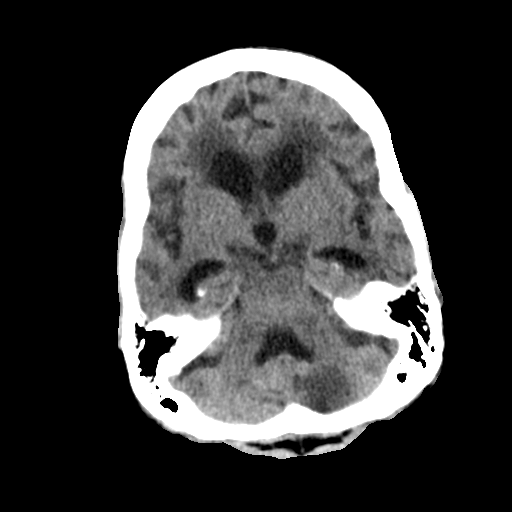
[im 13/31  brain]
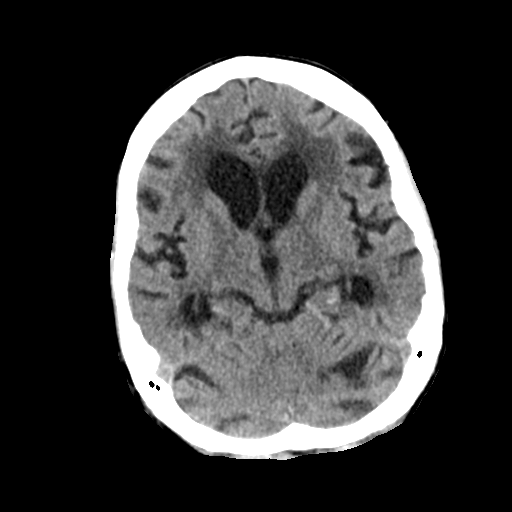
[im 16/31  brain]
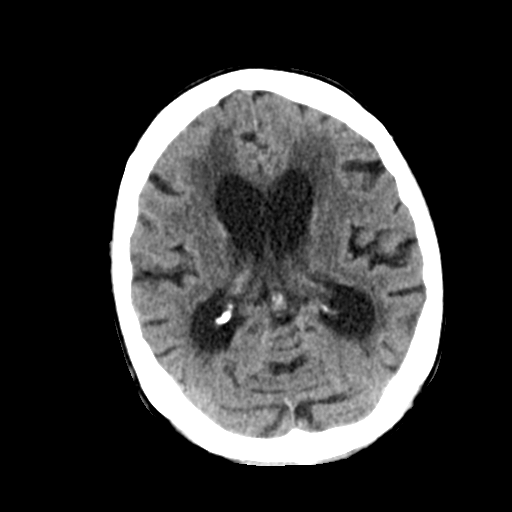
[im 16/31  bone]
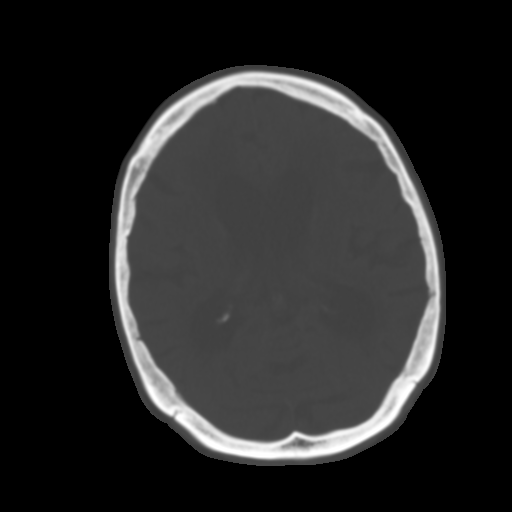
[im 19/31  brain]
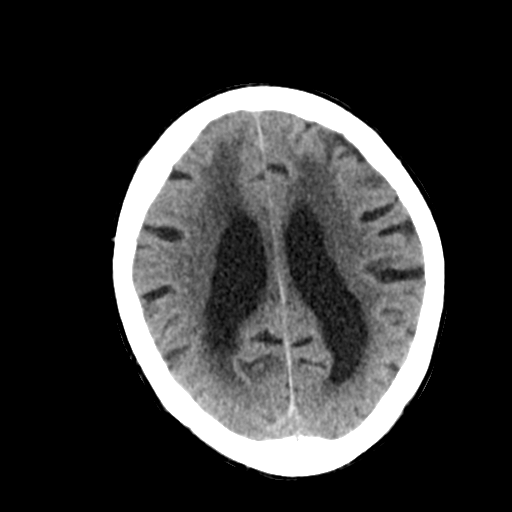
[im 22/31  brain]
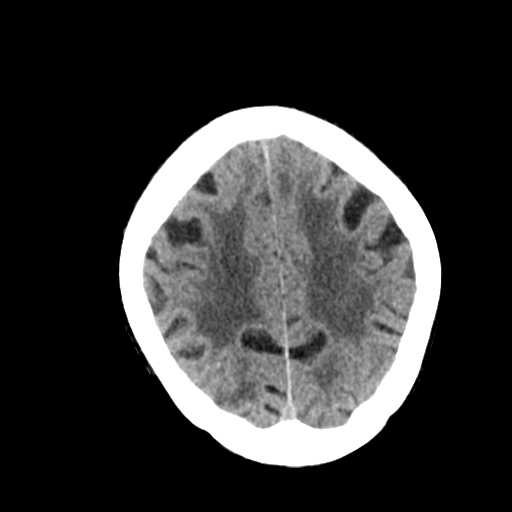
[im 25/31  brain]
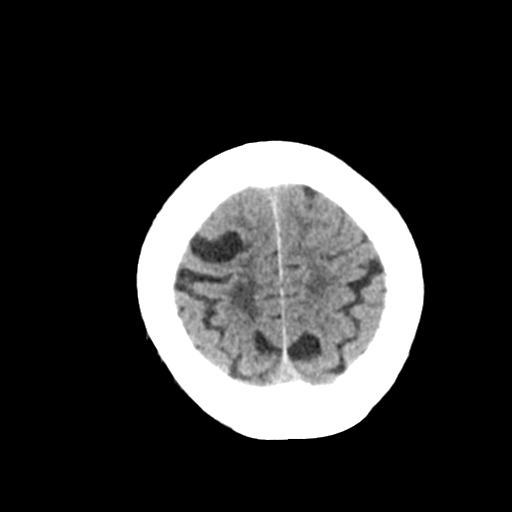
[im 28/31  brain]
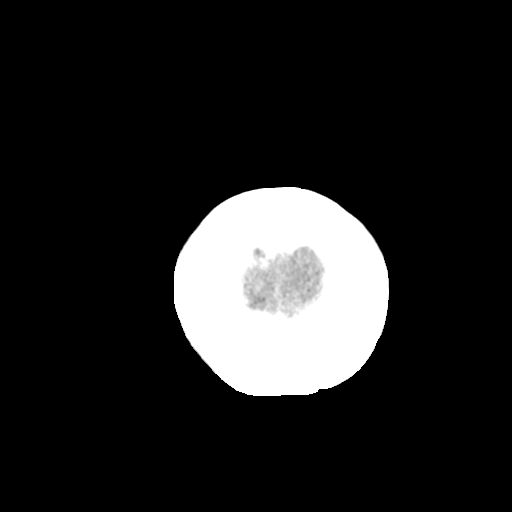
[im 28/31  bone]
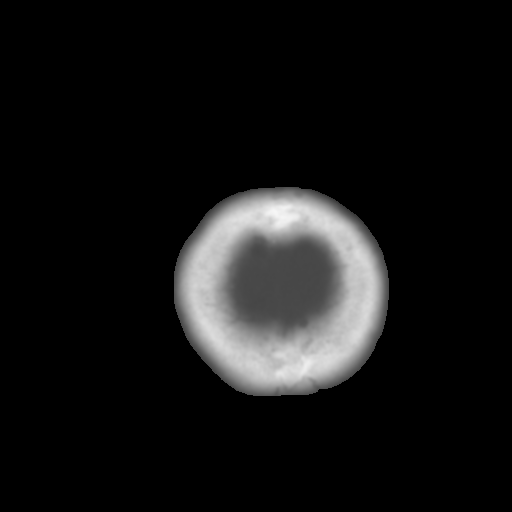

[Series 4: head 3.0 mpr cor · coronal · 0.30mm/px · 3 of 67 slices shown]
[im 23/67  brain]
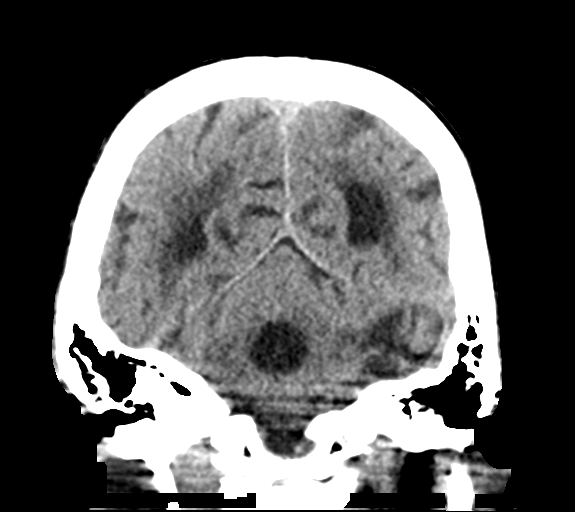
[im 30/67  brain]
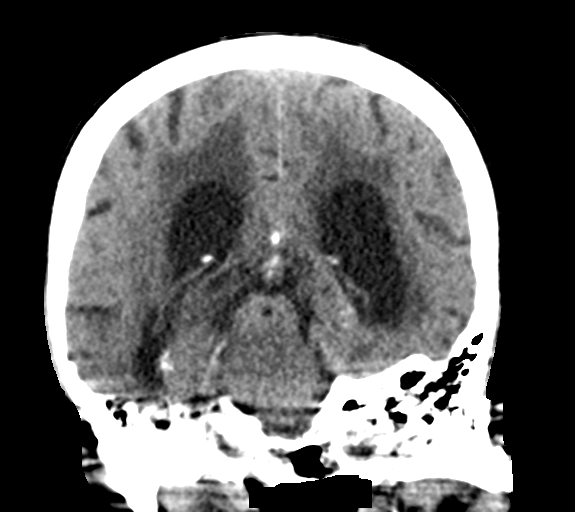
[im 37/67  brain]
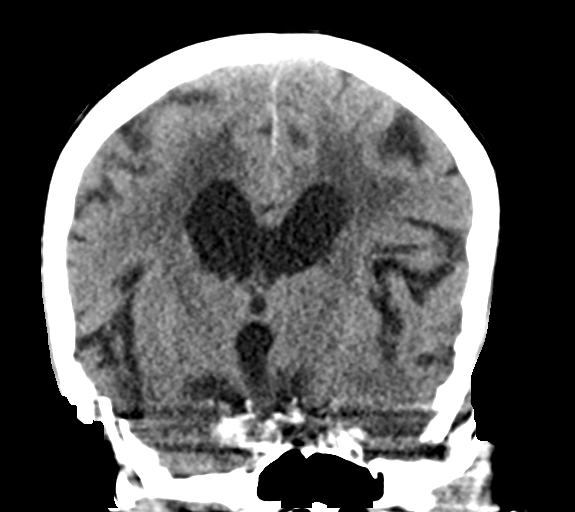

[Series 5: head 3.0 mpr sag · sagittal · 0.31mm/px · 3 of 67 slices shown]
[im 23/67  brain]
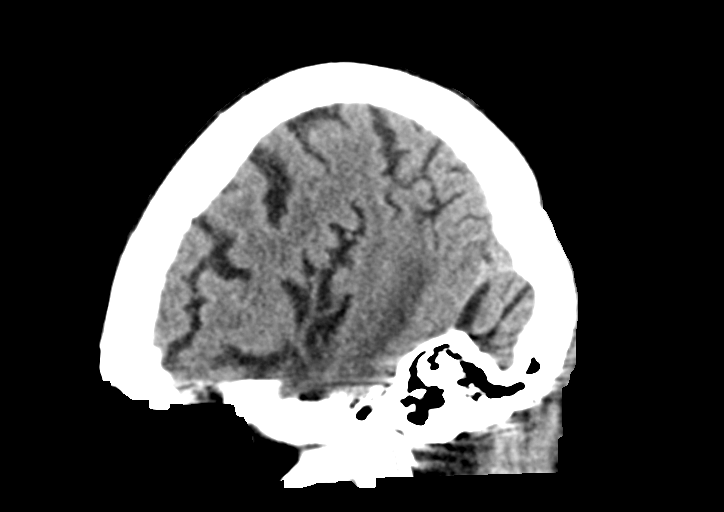
[im 34/67  brain]
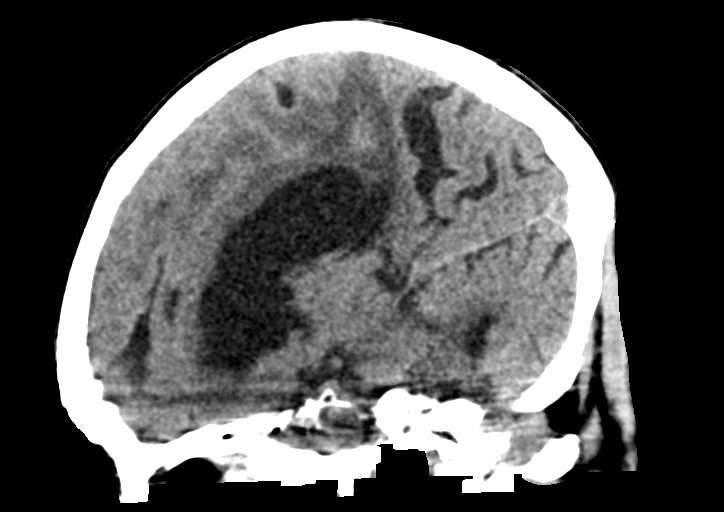
[im 45/67  brain]
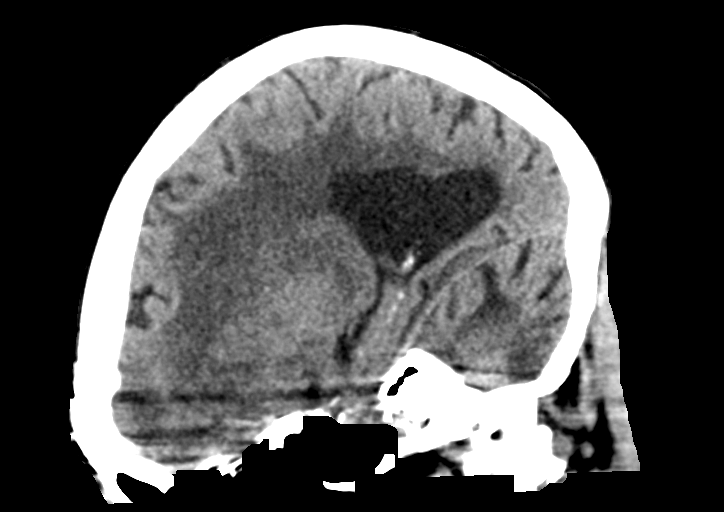

[15 of 47 positions shown; findings below may reference images not displayed]

FINDINGS: Brain: Moderate degree of periventricular and subcortical white
matter hypodensity consistent with small vessel ischemic disease
likely chronic. Moderate ventriculomegaly consistent with central
atrophy. Chronic left cerebellar infarct. No acute intracranial
hemorrhage, midline shift or edema. No extra-axial fluid collection.
No effacement of the basal cisterns or fourth ventricle. No acute
large vascular territory

Vascular: No hyperdense vessels. Calcifications of both carotid
siphons.

Skull: Nonacute

Sinuses/Orbits: Symmetric appearing orbits. Clear mastoids. The
visualized paranasal sinuses are unremarkable.

Other: None
IMPRESSION: Moderate degree of periventricular and subcortical white matter
hypodensity likely reflecting chronic small vessel ischemic disease.
Bilateral carotid siphon calcifications. Central atrophy.

No acute intracranial hemorrhage, midline shift nor large vascular
territory infarction.

Chronic left cerebellar infarct.

## 2018-10-15 ENCOUNTER — Telehealth: Payer: Self-pay | Admitting: *Deleted

## 2018-10-15 NOTE — Telephone Encounter (Signed)
Mel with [facility] called requesting verbal orders for Skilled Nursing 2x2wks. Verbal order given.

## 2018-10-16 ENCOUNTER — Telehealth: Payer: Self-pay | Admitting: *Deleted

## 2018-10-16 DIAGNOSIS — Z7984 Long term (current) use of oral hypoglycemic drugs: Secondary | ICD-10-CM | POA: Diagnosis not present

## 2018-10-16 DIAGNOSIS — I890 Lymphedema, not elsewhere classified: Secondary | ICD-10-CM | POA: Diagnosis not present

## 2018-10-16 DIAGNOSIS — F0390 Unspecified dementia without behavioral disturbance: Secondary | ICD-10-CM | POA: Diagnosis not present

## 2018-10-16 DIAGNOSIS — I10 Essential (primary) hypertension: Secondary | ICD-10-CM | POA: Diagnosis not present

## 2018-10-16 DIAGNOSIS — L89153 Pressure ulcer of sacral region, stage 3: Secondary | ICD-10-CM | POA: Diagnosis not present

## 2018-10-16 DIAGNOSIS — E1142 Type 2 diabetes mellitus with diabetic polyneuropathy: Secondary | ICD-10-CM | POA: Diagnosis not present

## 2018-10-16 NOTE — Telephone Encounter (Signed)
Mel with [facility] called and stated that she was in to see patient today and she had irregular heartbeat and muscle spasms in thighs and hands. She is wondering if patient's potassium is lo or if you had any other interventions. No other symptoms noted.  Please Advise.

## 2018-10-16 NOTE — Telephone Encounter (Signed)
Yes we can have them check a BMP to check her electrolytes

## 2018-10-16 NOTE — Telephone Encounter (Signed)
#  [phone]- Mel, nurse with [facility] notified and agreed. Verbal order given.

## 2018-10-16 NOTE — Telephone Encounter (Signed)
Mel with [facility] stated that they could do the labs in home if that was ok with you. Please Advise.

## 2018-10-16 NOTE — Telephone Encounter (Signed)
This is possible. We can set up an office visit and check her blood work at that time if they would like

## 2018-10-19 ENCOUNTER — Telehealth: Payer: Self-pay | Admitting: *Deleted

## 2018-10-19 DIAGNOSIS — I890 Lymphedema, not elsewhere classified: Secondary | ICD-10-CM | POA: Diagnosis not present

## 2018-10-19 DIAGNOSIS — Z7984 Long term (current) use of oral hypoglycemic drugs: Secondary | ICD-10-CM | POA: Diagnosis not present

## 2018-10-19 DIAGNOSIS — I10 Essential (primary) hypertension: Secondary | ICD-10-CM | POA: Diagnosis not present

## 2018-10-19 DIAGNOSIS — L89153 Pressure ulcer of sacral region, stage 3: Secondary | ICD-10-CM | POA: Diagnosis not present

## 2018-10-19 DIAGNOSIS — F0390 Unspecified dementia without behavioral disturbance: Secondary | ICD-10-CM | POA: Diagnosis not present

## 2018-10-19 DIAGNOSIS — E1142 Type 2 diabetes mellitus with diabetic polyneuropathy: Secondary | ICD-10-CM | POA: Diagnosis not present

## 2018-10-19 NOTE — Telephone Encounter (Signed)
Nurse calling stating she needs to move the discharge to next week, FYI.

## 2018-10-19 NOTE — Telephone Encounter (Signed)
Tried to draw BMP, pt's vein blowed they will try again this afternoon, just [facility]

## 2018-10-19 NOTE — Telephone Encounter (Signed)
Noted  

## 2018-10-22 NOTE — Telephone Encounter (Signed)
Myna Bright, nurse with [facility] called and stated that patient's labs for BMP was unsuccessful. Stated that the nurse tried and was unable to get it and patient/family refused additional draw and stated that they would just bring into office to have drawn.   Will you place order for this. Please Advise.

## 2018-10-22 NOTE — Telephone Encounter (Signed)
Tried calling Mel [phone]- Rings Busy x 2. Tried calling Home number for patient and cannot leave message. Will try again later.

## 2018-10-22 NOTE — Telephone Encounter (Signed)
It has been several days since the original request, is she still having issues? If she has not had anymore problems would recommend staying hydrated and keeping follow up as scheduled and we can discuss further at that time

## 2018-10-23 DIAGNOSIS — E1142 Type 2 diabetes mellitus with diabetic polyneuropathy: Secondary | ICD-10-CM | POA: Diagnosis not present

## 2018-10-23 DIAGNOSIS — Z7984 Long term (current) use of oral hypoglycemic drugs: Secondary | ICD-10-CM | POA: Diagnosis not present

## 2018-10-23 DIAGNOSIS — F0390 Unspecified dementia without behavioral disturbance: Secondary | ICD-10-CM | POA: Diagnosis not present

## 2018-10-23 DIAGNOSIS — I890 Lymphedema, not elsewhere classified: Secondary | ICD-10-CM | POA: Diagnosis not present

## 2018-10-23 DIAGNOSIS — I10 Essential (primary) hypertension: Secondary | ICD-10-CM | POA: Diagnosis not present

## 2018-10-23 DIAGNOSIS — L89153 Pressure ulcer of sacral region, stage 3: Secondary | ICD-10-CM | POA: Diagnosis not present

## 2018-10-23 NOTE — Telephone Encounter (Signed)
Mel with [facility] notified and agreed and will notify patient family.

## 2018-10-24 ENCOUNTER — Encounter: Payer: Self-pay | Admitting: Nurse Practitioner

## 2018-10-24 ENCOUNTER — Other Ambulatory Visit: Payer: Self-pay

## 2018-10-24 ENCOUNTER — Ambulatory Visit (INDEPENDENT_AMBULATORY_CARE_PROVIDER_SITE_OTHER): Payer: Medicare Other | Admitting: Nurse Practitioner

## 2018-10-24 ENCOUNTER — Ambulatory Visit: Payer: Self-pay

## 2018-10-24 DIAGNOSIS — E1142 Type 2 diabetes mellitus with diabetic polyneuropathy: Secondary | ICD-10-CM | POA: Diagnosis not present

## 2018-10-24 DIAGNOSIS — Z0000 Encounter for general adult medical examination without abnormal findings: Secondary | ICD-10-CM

## 2018-10-24 DIAGNOSIS — F0390 Unspecified dementia without behavioral disturbance: Secondary | ICD-10-CM | POA: Diagnosis not present

## 2018-10-24 DIAGNOSIS — R1310 Dysphagia, unspecified: Secondary | ICD-10-CM

## 2018-10-24 DIAGNOSIS — I10 Essential (primary) hypertension: Secondary | ICD-10-CM | POA: Diagnosis not present

## 2018-10-24 DIAGNOSIS — L89153 Pressure ulcer of sacral region, stage 3: Secondary | ICD-10-CM

## 2018-10-24 DIAGNOSIS — I499 Cardiac arrhythmia, unspecified: Secondary | ICD-10-CM

## 2018-10-24 NOTE — Progress Notes (Signed)
Patient ID: [name], female   DOB: Jun 24, 1935, 83 y.o.   MRN: [id number] This service is provided via telemedicine  No vital signs collected/recorded due to the encounter was a telemedicine visit.   Location of patient (ex: home, work):  Home  Patient consents to a telephone visit:  Yes  Location of the provider (ex: office, home):  Office  Name of any referring provider:  N/A  Names of all persons participating in the telemedicine service and their role in the encounter:  Patient, Daughter in-law  Time spent on call:  10 min

## 2018-10-24 NOTE — Patient Instructions (Signed)
[name] , Thank you for taking time to come for your Medicare Wellness Visit. I appreciate your ongoing commitment to your health goals. Please review the following plan we discussed and let me know if I can assist you in the future.   Screening recommendations/referrals: Colonoscopy aged out Mammogram aged out Bone Density up to date Recommended yearly ophthalmology/optometry visit for glaucoma screening and checkup Recommended yearly dental visit for hygiene and checkup  Vaccinations: Influenza vaccine declines vaccines  Pneumococcal vaccine -not on our records, recommend that she gets pneumonia vaccines Tdap vaccine: recommended, would be given at the pharmacy if you chose to give.  Shingles vaccine: recommended, to get this at pharmacy  Advanced directives: on file  Conditions/risks identified: progressive weakness.    Preventive Care 25 Years and Older, Female Preventive care refers to lifestyle choices and visits with your health care provider that can promote health and wellness. What does preventive care include?  A yearly physical exam. This is also called an annual well check.  Dental exams once or twice a year.  Routine eye exams. Ask your health care provider how often you should have your eyes checked.  Personal lifestyle choices, including:  Daily care of your teeth and gums.  Regular physical activity.  Eating a healthy diet.  Avoiding tobacco and drug use.  Limiting alcohol use.  Practicing safe sex.  Taking low-dose aspirin every day.  Taking vitamin and mineral supplements as recommended by your health care provider. What happens during an annual well check? The services and screenings done by your health care provider during your annual well check will depend on your age, overall health, lifestyle risk factors, and family history of disease. Counseling  Your health care provider may ask you questions about your:  Alcohol use.  Tobacco use.   Drug use.  Emotional well-being.  Home and relationship well-being.  Sexual activity.  Eating habits.  History of falls.  Memory and ability to understand (cognition).  Work and work Statistician.  Reproductive health. Screening  You may have the following tests or measurements:  Height, weight, and BMI.  Blood pressure.  Lipid and cholesterol levels. These may be checked every 5 years, or more frequently if you are over 60 years old.  Skin check.  Lung cancer screening. You may have this screening every year starting at age 57 if you have a 30-pack-year history of smoking and currently smoke or have quit within the past 15 years.  Fecal occult blood test (FOBT) of the stool. You may have this test every year starting at age 69.  Flexible sigmoidoscopy or colonoscopy. You may have a sigmoidoscopy every 5 years or a colonoscopy every 10 years starting at age 40.  Hepatitis C blood test.  Hepatitis B blood test.  Sexually transmitted disease (STD) testing.  Diabetes screening. This is done by checking your blood sugar (glucose) after you have not eaten for a while (fasting). You may have this done every 1-3 years.  Bone density scan. This is done to screen for osteoporosis. You may have this done starting at age 13.  Mammogram. This may be done every 1-2 years. Talk to your health care provider about how often you should have regular mammograms. Talk with your health care provider about your test results, treatment options, and if necessary, the need for more tests. Vaccines  Your health care provider may recommend certain vaccines, such as:  Influenza vaccine. This is recommended every year.  Tetanus, diphtheria, and acellular pertussis (  Tdap, Td) vaccine. You may need a Td booster every 10 years.  Zoster vaccine. You may need this after age 28.  Pneumococcal 13-valent conjugate (PCV13) vaccine. One dose is recommended after age 38.  Pneumococcal  polysaccharide (PPSV23) vaccine. One dose is recommended after age 70. Talk to your health care provider about which screenings and vaccines you need and how often you need them. This information is not intended to replace advice given to you by your health care provider. Make sure you discuss any questions you have with your health care provider. Document Released: 05/22/2015 Document Revised: 01/13/2016 Document Reviewed: 02/24/2015 Elsevier Interactive Patient Education  2017 [employer] Prevention in the Home Falls can cause injuries. They can happen to people of all ages. There are many things you can do to make your home safe and to help prevent falls. What can I do on the outside of my home?  Regularly fix the edges of walkways and driveways and fix any cracks.  Remove anything that might make you trip as you walk through a door, such as a raised step or threshold.  Trim any bushes or trees on the path to your home.  Use bright outdoor lighting.  Clear any walking paths of anything that might make someone trip, such as rocks or tools.  Regularly check to see if handrails are loose or broken. Make sure that both sides of any steps have handrails.  Any raised decks and porches should have guardrails on the edges.  Have any leaves, snow, or ice cleared regularly.  Use sand or salt on walking paths during winter.  Clean up any spills in your garage right away. This includes oil or grease spills. What can I do in the bathroom?  Use night lights.  Install grab bars by the toilet and in the tub and shower. Do not use towel bars as grab bars.  Use non-skid mats or decals in the tub or shower.  If you need to sit down in the shower, use a plastic, non-slip stool.  Keep the floor dry. Clean up any water that spills on the floor as soon as it happens.  Remove soap buildup in the tub or shower regularly.  Attach bath mats securely with double-sided non-slip rug tape.   Do not have throw rugs and other things on the floor that can make you trip. What can I do in the bedroom?  Use night lights.  Make sure that you have a light by your bed that is easy to reach.  Do not use any sheets or blankets that are too big for your bed. They should not hang down onto the floor.  Have a firm chair that has side arms. You can use this for support while you get dressed.  Do not have throw rugs and other things on the floor that can make you trip. What can I do in the kitchen?  Clean up any spills right away.  Avoid walking on wet floors.  Keep items that you use a lot in easy-to-reach places.  If you need to reach something above you, use a strong step stool that has a grab bar.  Keep electrical cords out of the way.  Do not use floor polish or wax that makes floors slippery. If you must use wax, use non-skid floor wax.  Do not have throw rugs and other things on the floor that can make you trip. What can I do with my stairs?  Do  not leave any items on the stairs.  Make sure that there are handrails on both sides of the stairs and use them. Fix handrails that are broken or loose. Make sure that handrails are as long as the stairways.  Check any carpeting to make sure that it is firmly attached to the stairs. Fix any carpet that is loose or worn.  Avoid having throw rugs at the top or bottom of the stairs. If you do have throw rugs, attach them to the floor with carpet tape.  Make sure that you have a light switch at the top of the stairs and the bottom of the stairs. If you do not have them, ask someone to add them for you. What else can I do to help prevent falls?  Wear shoes that:  Do not have high heels.  Have rubber bottoms.  Are comfortable and fit you well.  Are closed at the toe. Do not wear sandals.  If you use a stepladder:  Make sure that it is fully opened. Do not climb a closed stepladder.  Make sure that both sides of the  stepladder are locked into place.  Ask someone to hold it for you, if possible.  Clearly mark and make sure that you can see:  Any grab bars or handrails.  First and last steps.  Where the edge of each step is.  Use tools that help you move around (mobility aids) if they are needed. These include:  Canes.  Walkers.  Scooters.  Crutches.  Turn on the lights when you go into a dark area. Replace any light bulbs as soon as they burn out.  Set up your furniture so you have a clear path. Avoid moving your furniture around.  If any of your floors are uneven, fix them.  If there are any pets around you, be aware of where they are.  Review your medicines with your doctor. Some medicines can make you feel dizzy. This can increase your chance of falling. Ask your doctor what other things that you can do to help prevent falls. This information is not intended to replace advice given to you by your health care provider. Make sure you discuss any questions you have with your health care provider. Document Released: 02/19/2009 Document Revised: 10/01/2015 Document Reviewed: 05/30/2014 Elsevier Interactive Patient Education  2017 [employer].

## 2018-10-24 NOTE — Progress Notes (Signed)
Subjective:   [name] is a 83 y.o. female who presents for Medicare Annual (Subsequent) preventive examination.  Review of Systems:  Cardiac Risk Factors include: advanced age (>53men, >26 women);diabetes mellitus;dyslipidemia;hypertension;sedentary lifestyle     Objective:     Vitals: There were no vitals taken for this visit.  There is no height or weight on file to calculate BMI.  Advanced Directives 09/10/2018 09/03/2018 07/02/2018 06/25/2018 06/18/2018 05/15/2018 05/04/2018  Does Patient Have a Medical Advance Directive? Yes Yes Yes Yes Yes Yes Yes  Type of [place] administrator Power of [place] of [place]  Does patient want to make changes to medical advance directive? No - Patient declined - No - Patient declined No - Patient declined - - -  Copy of [place] in Chart? Yes - validated most recent copy scanned in chart (See row information) - - Yes - validated most recent copy scanned in chart (See row information) - - Yes - validated most recent copy scanned in chart (See row information)    Tobacco Social History   Tobacco Use  Smoking Status Never Smoker  Smokeless Tobacco Never Used     Counseling given: Not Answered   Clinical Intake:  Pre-visit preparation completed: Yes  Pain : Faces Faces Pain Scale: Hurts little more Pain Type: Chronic pain Pain Location: Knee Pain Orientation: Left Pain Onset: More than a month ago Pain Frequency: Constant Pain Relieving Factors: rest Effect of Pain on Daily Activities: pain associated with increase movement.  Faces Pain Scale: Hurts little more Pain Relieving Factors: rest  Nutritional Risks: Unintentional weight loss, Failure to thrive, Non-healing wound Diabetes: Yes CBG done?: No Did pt. bring in CBG monitor from home?: No  How often do you need to have someone help you when you read instructions, pamphlets, or other written materials from your doctor or pharmacy?: 5 - Always  Interpreter Needed?: No     Past Medical History:  Diagnosis Date  . Dementia ([place])   . Diabetes mellitus type II 03/21/2011  . DVT (deep venous thrombosis) ([place]) 03/21/2011  . Hyperlipidemia 03/21/2011  . Hypertension 03/21/2011  . Lymphedema of leg 03/21/2011  . Pulmonary embolism ([place]) 03/21/2011  . Stroke [facility])    Past Surgical History:  Procedure Laterality Date  . CHOLECYSTECTOMY  2015  . SKIN TAG REMOVAL  07/25/2016  . VAGINAL HYSTERECTOMY     unknown of date  . VASCULAR SURGERY     Family History  Problem Relation Age of Onset  . Heart attack Mother   . COPD Sister   . Stroke Sister   . Bipolar disorder Daughter    Social History   Socioeconomic History  . Marital status: Widowed    Spouse name: Not on file  . Number of children: Not on file  . Years of education: Not on file  . Highest education level: Not on file  Occupational History  . Not on file  Social Needs  . Financial resource strain: Not hard at all  . Food insecurity    Worry: Never true    Inability: Never true  . Transportation needs    Medical: No    Non-medical: No  Tobacco Use  . Smoking status: Never Smoker  . Smokeless tobacco: Never Used  Substance and Sexual Activity  . Alcohol use: No  . Drug use: No  .  Sexual activity: Never  Lifestyle  . Physical activity    Days per week: 0 days    Minutes per session: 0 min  . Stress: Not at all  Relationships  . Social connections    Talks on phone: More than three times a week    Gets together: More than three times a week    Attends religious service: More than 4 times per year    Active member of club or organization: No    Attends meetings of clubs or organizations: Never    Relationship status: Widowed  Other Topics Concern  . Not on file  Social History Narrative    Diet?       Do you drink/eat things with caffeine? occasional diet soda      Marital status?   Widowed/divorced                                 What year were you married? [place] you live in a house, apartment, assisted living, condo, trailer, etc.? home      Is it one or more stories? More (mainly living on one floor)      How many persons live in your home?      Do you have any pets in your home? (please list) no      Current or past profession: teacher      Do you exercise?       Physical therapy                              Type & how often? 2-3 per week      Do you have a living will? yes      Do you have a DNR form?      ?                             If not, do you want to discuss one?      Do you have signed POA/HPOA for forms? Yes   11/29/16 Lives at home wih family, son, daughter in law, patrice and grand children.  Has 3 children.  She is widowed, retired.  Has BA- education level.      Outpatient Encounter Medications as of 10/24/2018  Medication Sig  . ACCU-CHEK SOFTCLIX LANCETS lancets 1 each by Other route daily. Use as instructed Dx: E11.9  . acetaminophen (TYLENOL) 500 MG tablet Take 500 mg by mouth as needed (general pain).   Marland Kitchen atorvastatin (LIPITOR) 20 MG tablet Take 1 tablet (20 mg total) by mouth daily at 6 PM.  . Cholecalciferol (VITAMIN D-3) 125 MCG (5000 UT) TABS Take 5,000 Units by mouth daily.  . cloNIDine (CATAPRES) 0.1 MG tablet Take 1 tablet (0.1 mg total) by mouth every 8 (eight) hours. May take an additional tablet if SBP >170  . diclofenac sodium (VOLTAREN) 1 % GEL Apply 1 application topically 2 (two) times daily.   Marland Kitchen gabapentin (NEURONTIN) 300 MG capsule Take 1 capsule (300 mg total) by mouth 2 (two) times daily.  Marland Kitchen glucose blood (ACCU-CHEK AVIVA PLUS) test strip 1 each by Other route 2 (two) times daily. E11.9  . losartan (COZAAR) 100 MG tablet Take 1 tablet (100 mg total) by mouth daily.  . metFORMIN (GLUCOPHAGE) 500 MG tablet  1000 mg  by mouth with breakfast and 500 mg by mouth with Dinner  . polyethylene glycol (MIRALAX / GLYCOLAX) packet Take 17 g by mouth as needed.  . Rivaroxaban (XARELTO) 15 MG TABS tablet Take 1 tablet (15 mg total) by mouth daily with supper.  . Vitamins A & D (VITAMIN A & D) ointment Apply 1 application topically as needed for dry skin.  . Wound Dressings (MEPILEX EX) Apply to sacrum and change every 3 days or sooner as needed   No facility-administered encounter medications on file as of 10/24/2018.     Activities of Daily Living In your present state of health, do you have any difficulty performing the following activities: 10/24/2018  Hearing? N  Vision? N  Difficulty concentrating or making decisions? Y  Walking or climbing stairs? Y  Dressing or bathing? Y  Doing errands, shopping? Y  Preparing Food and eating ? Y  Comment no difficult eating at this time, has to be fed.  Using the Toilet? Y  In the past six months, have you accidently leaked urine? Y  Do you have problems with loss of bowel control? Y  Managing your Medications? Y  Managing your Finances? Y  Housekeeping or managing your Housekeeping? Y  Some recent data might be hidden    Patient Care Team: [name], NP as PCP - General (Geriatric Medicine) [name], MD as Consulting Physician (Oncology) [name], [name], LCSW as Social Worker (Licensed Clinical Social Worker) [name], RN as Registered Nurse [facility] and Palliative Medicine)    Assessment:   This is a routine wellness examination for [employer].  Exercise Activities and Dietary recommendations Current Exercise Habits: The patient does not participate in regular exercise at present, Exercise limited by: psychological condition(s);neurologic condition(s)  Goals    . Increase strength     Starting 07/26/16 I will increase my mobility and strength with OT and PT.       Fall Risk Fall Risk  10/24/2018 10/24/2018 09/10/2018 08/21/2018  07/20/2018  Falls in the past year? 1 1 0 1 1  Number falls in past yr: 0 0 0 1 1  Comment - - - - -  Injury with Fall? 0 0 0 0 0  Risk Factor Category  - - - - -  Risk for fall due to : - - - History of fall(s);Impaired mobility -  Follow up - - - - -   Is the patient's home free of loose throw rugs in walkways, pet beds, electrical cords, etc?   yes      Grab bars in the bathroom? yes      Handrails on the stairs?   yes      Adequate lighting?   yes  Timed Get Up and Go performed: na  Depression Screen PHQ 2/9 Scores 10/24/2018 10/24/2018 05/04/2018 10/18/2017  PHQ - 2 Score 0 0 0 0     Cognitive Function MMSE - Mini Mental State Exam 10/18/2017 07/26/2016  Not completed: Unable to complete Unable to complete     6CIT Screen 10/24/2018  What Year? (No Data)  What month? (No Data)  What time? (No Data)  Count back from 20 (No Data)  Months in reverse (No Data)  Repeat phrase (No Data)     There is no immunization history on file for this patient.  Qualifies for Shingles Vaccine?yes, declines all vaccine  Screening Tests Health Maintenance  Topic Date Due  . FOOT EXAM  08/08/1945  .  OPHTHALMOLOGY EXAM  01/24/2018  . TETANUS/TDAP  05/10/2019 (Originally 08/07/1954)  . PNA vac Low Risk Adult (1 of 2 - PCV13) 08/21/2019 (Originally 08/08/2002)  . HEMOGLOBIN A1C  03/05/2019  . DEXA SCAN  Completed  . INFLUENZA VACCINE  Discontinued    Cancer Screenings: Lung: Low Dose CT Chest recommended if Age 19-80 years, 30 pack-year currently smoking OR have quit w/in 15years. Patient does not qualify. Breast:  Up to date on Mammogram?   Aged out Up to date of Bone Density/Dexa? Yes  Colorectal: aged out  Additional Screenings:  Hepatitis C Screening: na     Plan:      I have personally reviewed and noted the following in the patient's chart:   . Medical and social history . Use of alcohol, tobacco or illicit drugs  . Current medications and supplements . Functional ability  and status . Nutritional status . Physical activity . Advanced directives . List of other physicians . Hospitalizations, surgeries, and ER visits in previous 12 months . Vitals . Screenings to include cognitive, depression, and falls . Referrals and appointments  In addition, I have reviewed and discussed with patient certain preventive protocols, quality metrics, and best practice recommendations. A written personalized care plan for preventive services as well as general preventive health recommendations were provided to patient.     [name], NP  10/24/2018  This will be the last AWV for this pt, she is nonverbal and total care, daughter in law takes care of her in the home.

## 2018-10-24 NOTE — Progress Notes (Signed)
Patient ID: [name], female   DOB: December 12, 1935, 83 y.o.   MRN: [id number] This service is provided via telemedicine  No vital signs collected/recorded due to the encounter was a telemedicine visit.   Location of patient (ex: home, work):  Home  Patient consents to a telephone visit:  Yes  Location of the provider (ex: office, home):  Office  Name of any referring provider:  N/A  Names of all persons participating in the telemedicine service and their role in the encounter: [name] (pts daughter in law), Patient, Marisa Cyphers RMA, [name] NP  Time spent on call:  10 min     Careteam: Patient Care Team: [name], NP as PCP - General (Geriatric Medicine) [name], MD as Consulting Physician (Oncology) [name], [name], LCSW as Social Worker (Licensed Clinical Social Worker) [name], RN as Registered Nurse ([facility] [place])  Advanced Directive information    No Known Allergies  Chief Complaint  Patient presents with  . Medical Management of Chronic Issues    2 Month Follow Up, Concerns; Heart Arrhythmia     HPI: Patient is a 83 y.o. female for routine follow up.   htn- feels like this is going well. Felt like her HR was getting too low on losartan, she decrease losartan to 75 mg. Gives losartan with clonidine and this has been controlling blood pressure. 150s/70s  DM with neuropathy- metformin 1000 mg in the Am and 500 mg in the PM, overall keeping blood sugar controlled. Occasionally will give her 500 mg in the morning if blood sugar is good. Uses gabapentin 300 mg which controls symptoms.   hyperlipidemia - continues on Lipitor 20 mg by mouth daily   Sacral ulcer and skin tear on leg right leg below knee on outside- followed by home health nursing.. Reports pressure ulcer on sacrum doing better.  Reports skin tear on leg from rubbing on the wheelchair. No redness. Using xeroform dressing.   Nursing had reported to  daughter that heart sounds were more irregular then when she first starting coming out.  No chest pains, no shortness, no increase fluid retention.  HR was slightly lower since increasing losartan. Improved with cutting back on losartan. Eating and drinking (tries to push fluids) Drinking protein shakes.   Evaluated with ST- gave recommendations to caregiver for feedings which has helped. They have increased oral care.   Pt currently on xarelto for hx of PE.   Review of Systems:  Review of Systems  Unable to perform ROS: Dementia    Past Medical History:  Diagnosis Date  . Dementia ([place])   . Diabetes mellitus type II 03/21/2011  . DVT (deep venous thrombosis) ([place]) 03/21/2011  . Hyperlipidemia 03/21/2011  . Hypertension 03/21/2011  . Lymphedema of leg 03/21/2011  . Pulmonary embolism ([place]) 03/21/2011  . Stroke [facility])    Past Surgical History:  Procedure Laterality Date  . CHOLECYSTECTOMY  2015  . SKIN TAG REMOVAL  07/25/2016  . VAGINAL HYSTERECTOMY     unknown of date  . VASCULAR SURGERY     Social History:   reports that she has never smoked. She has never used smokeless tobacco. She reports that she does not drink alcohol or use drugs.  Family History  Problem Relation Age of Onset  . Heart attack Mother   . COPD Sister   . Stroke Sister   . Bipolar disorder Daughter     Medications: Patient's Medications  New Prescriptions  No medications on file  Previous Medications   ACCU-CHEK SOFTCLIX LANCETS LANCETS    1 each by Other route daily. Use as instructed Dx: E11.9   ACETAMINOPHEN (TYLENOL) 500 MG TABLET    Take 500 mg by mouth as needed (general pain).    ATORVASTATIN (LIPITOR) 20 MG TABLET    Take 1 tablet (20 mg total) by mouth daily at 6 PM.   CHOLECALCIFEROL (VITAMIN D-3) 125 MCG (5000 UT) TABS    Take 5,000 Units by mouth daily.   CLONIDINE (CATAPRES) 0.1 MG TABLET    Take 1 tablet (0.1 mg total) by mouth every 8 (eight) hours. May take an additional tablet  if SBP >170   DICLOFENAC SODIUM (VOLTAREN) 1 % GEL    Apply 1 application topically 2 (two) times daily.    GABAPENTIN (NEURONTIN) 300 MG CAPSULE    Take 1 capsule (300 mg total) by mouth 2 (two) times daily.   GLUCOSE BLOOD (ACCU-CHEK AVIVA PLUS) TEST STRIP    1 each by Other route 2 (two) times daily. E11.9   LOSARTAN (COZAAR) 100 MG TABLET    Take 1 tablet (100 mg total) by mouth daily.   METFORMIN (GLUCOPHAGE) 500 MG TABLET    1000 mg by mouth with breakfast and 500 mg by mouth with Dinner   POLYETHYLENE GLYCOL (MIRALAX / GLYCOLAX) PACKET    Take 17 g by mouth as needed.   RIVAROXABAN (XARELTO) 15 MG TABS TABLET    Take 1 tablet (15 mg total) by mouth daily with supper.   VITAMINS A & D (VITAMIN A & D) OINTMENT    Apply 1 application topically as needed for dry skin.   WOUND DRESSINGS ([place] EX)    Apply to sacrum and change every 3 days or sooner as needed  Modified Medications   No medications on file  Discontinued Medications   No medications on file    Physical Exam:  There were no vitals filed for this visit. There is no height or weight on file to calculate BMI. Wt Readings from Last 3 Encounters:  01/30/18 130 lb (59 kg)  08/07/17 130 lb (59 kg)  04/20/17 128 lb (58.1 kg)      Labs reviewed: Basic Metabolic Panel: Recent Labs    05/15/18 1727 05/15/18 1734 06/18/18 1120 09/03/18 1143  NA 136 137 140 139  K 4.0 4.1 4.3 4.4  CL 102 101 101 98  CO2 25  --  32 33*  GLUCOSE 225* 227* 215* 187*  BUN 15 19 25* 18  CREATININE 0.50 0.40* 0.56 0.49  CALCIUM 9.0  --  9.9 10.4*   Liver Function Tests: Recent Labs    05/15/18 1727 06/18/18 1120 09/03/18 1143  AST 16 12* 11*  ALT 11 9 9   ALKPHOS 49 59 71  BILITOT 0.6 0.3 0.4  PROT 6.6 6.6 7.5  ALBUMIN 2.9* 3.7 4.1   No results for input(s): LIPASE, AMYLASE in the last 8760 hours. No results for input(s): AMMONIA in the last 8760 hours. CBC: Recent Labs    05/15/18 1727 05/15/18 1734 06/18/18 1120  09/03/18 1143  WBC 3.9*  --  5.1 4.3  NEUTROABS 1.7  --  2.9 2.4  HGB 10.5* 11.9* 11.3* 12.3  HCT 34.8* 35.0* 36.7 40.1  MCV 96.7  --  95.8 93.0  PLT 211  --  235 219   Lipid Panel: Recent Labs    09/03/18 1143  CHOL 173  HDL 52  LDLCALC 103*  TRIG 90  CHOLHDL 3.3   TSH: No results for input(s): TSH in the last 8760 hours. A1C: Lab Results  Component Value Date   HGBA1C 8.2 (H) 09/03/2018     Assessment/Plan 1. Irregular heartbeat -noted per hh nursing. Pt without complaints of chest pains, shortness of breath, increased edema. Question if she is having PVCs/PAVs. She is not tachycardic based on home HR readings.  Daughter declines coming in for EKG at this time.  - COMPLETE METABOLIC PANEL WITH GFR; Future - CBC with Differential/Platelet; Future  2. Essential hypertension Stable, continues on losartan 75 mg daily with clonidine every 8 hours.   3. Type 2 diabetes mellitus with diabetic polyneuropathy, without long-term current use of insulin (HCC) -continues on metformin 1000 mg by mouth in am and 500 in evening for blood sugar control.  - COMPLETE METABOLIC PANEL WITH GFR; Future - CBC with Differential/Platelet; Future  4. Pressure ulcer of sacral region, stage 3 ([place]) Continues to receive home health nursing, overall doing better per daughter  5. Dementia without behavioral disturbance, unspecified dementia type ([place]) -advanced dementia needing total care. Continues to be followed by palliative care.  6. Dysphagia, unspecified type Has ST evaluation and changed the way they were feeding her with improvement.   Next appt:  4 month follow up.  Carlos American. Harle Battiest  [facility] [phone]    Virtual Visit via Telephone Note  I connected with@ on 10/24/18 at 10:30 AM EDT by telephone and verified that I am speaking with the correct person using two identifiers.  Location: Patient: home Provider: office    I discussed  the limitations, risks, security and privacy concerns of performing an evaluation and management service by telephone and the availability of in person appointments. I also discussed with the patient that there may be a patient responsible charge related to this service. The patient expressed understanding and agreed to proceed.   I discussed the assessment and treatment plan with the patient. The patient was provided an opportunity to ask questions and all were answered. The patient agreed with the plan and demonstrated an understanding of the instructions.   The patient was advised to call back or seek an in-person evaluation if the symptoms worsen or if the condition fails to improve as anticipated.  I provided 26 minutes of non-face-to-face time during this encounter.  Carlos American. Harle Battiest Avs printed and mailed

## 2018-10-25 DIAGNOSIS — I890 Lymphedema, not elsewhere classified: Secondary | ICD-10-CM | POA: Diagnosis not present

## 2018-10-25 DIAGNOSIS — Z7984 Long term (current) use of oral hypoglycemic drugs: Secondary | ICD-10-CM | POA: Diagnosis not present

## 2018-10-25 DIAGNOSIS — I10 Essential (primary) hypertension: Secondary | ICD-10-CM | POA: Diagnosis not present

## 2018-10-25 DIAGNOSIS — L89153 Pressure ulcer of sacral region, stage 3: Secondary | ICD-10-CM | POA: Diagnosis not present

## 2018-10-25 DIAGNOSIS — F0390 Unspecified dementia without behavioral disturbance: Secondary | ICD-10-CM | POA: Diagnosis not present

## 2018-10-25 DIAGNOSIS — E1142 Type 2 diabetes mellitus with diabetic polyneuropathy: Secondary | ICD-10-CM | POA: Diagnosis not present

## 2018-10-26 DIAGNOSIS — L89153 Pressure ulcer of sacral region, stage 3: Secondary | ICD-10-CM | POA: Diagnosis not present

## 2018-10-26 DIAGNOSIS — Z7984 Long term (current) use of oral hypoglycemic drugs: Secondary | ICD-10-CM | POA: Diagnosis not present

## 2018-10-26 DIAGNOSIS — I890 Lymphedema, not elsewhere classified: Secondary | ICD-10-CM | POA: Diagnosis not present

## 2018-10-26 DIAGNOSIS — I10 Essential (primary) hypertension: Secondary | ICD-10-CM | POA: Diagnosis not present

## 2018-10-26 DIAGNOSIS — F0390 Unspecified dementia without behavioral disturbance: Secondary | ICD-10-CM | POA: Diagnosis not present

## 2018-10-26 DIAGNOSIS — E1142 Type 2 diabetes mellitus with diabetic polyneuropathy: Secondary | ICD-10-CM | POA: Diagnosis not present

## 2018-10-27 DIAGNOSIS — Z86718 Personal history of other venous thrombosis and embolism: Secondary | ICD-10-CM

## 2018-10-27 DIAGNOSIS — I890 Lymphedema, not elsewhere classified: Secondary | ICD-10-CM | POA: Diagnosis not present

## 2018-10-27 DIAGNOSIS — L89153 Pressure ulcer of sacral region, stage 3: Secondary | ICD-10-CM | POA: Diagnosis not present

## 2018-10-27 DIAGNOSIS — Z7984 Long term (current) use of oral hypoglycemic drugs: Secondary | ICD-10-CM | POA: Diagnosis not present

## 2018-10-27 DIAGNOSIS — Z7901 Long term (current) use of anticoagulants: Secondary | ICD-10-CM | POA: Diagnosis not present

## 2018-10-27 DIAGNOSIS — Z86711 Personal history of pulmonary embolism: Secondary | ICD-10-CM

## 2018-10-27 DIAGNOSIS — E1142 Type 2 diabetes mellitus with diabetic polyneuropathy: Secondary | ICD-10-CM | POA: Diagnosis not present

## 2018-10-27 DIAGNOSIS — F0390 Unspecified dementia without behavioral disturbance: Secondary | ICD-10-CM | POA: Diagnosis not present

## 2018-10-27 DIAGNOSIS — Z8673 Personal history of transient ischemic attack (TIA), and cerebral infarction without residual deficits: Secondary | ICD-10-CM | POA: Diagnosis not present

## 2018-10-27 DIAGNOSIS — I10 Essential (primary) hypertension: Secondary | ICD-10-CM | POA: Diagnosis not present

## 2018-10-27 DIAGNOSIS — R1312 Dysphagia, oropharyngeal phase: Secondary | ICD-10-CM | POA: Diagnosis not present

## 2018-10-29 ENCOUNTER — Other Ambulatory Visit: Payer: Medicare Other

## 2018-10-29 ENCOUNTER — Other Ambulatory Visit: Payer: Self-pay

## 2018-10-29 DIAGNOSIS — I499 Cardiac arrhythmia, unspecified: Secondary | ICD-10-CM | POA: Diagnosis not present

## 2018-10-29 DIAGNOSIS — M79662 Pain in left lower leg: Secondary | ICD-10-CM | POA: Diagnosis not present

## 2018-10-29 DIAGNOSIS — E1142 Type 2 diabetes mellitus with diabetic polyneuropathy: Secondary | ICD-10-CM

## 2018-10-30 DIAGNOSIS — F0390 Unspecified dementia without behavioral disturbance: Secondary | ICD-10-CM | POA: Diagnosis not present

## 2018-10-30 DIAGNOSIS — I890 Lymphedema, not elsewhere classified: Secondary | ICD-10-CM | POA: Diagnosis not present

## 2018-10-30 DIAGNOSIS — L89153 Pressure ulcer of sacral region, stage 3: Secondary | ICD-10-CM | POA: Diagnosis not present

## 2018-10-30 DIAGNOSIS — I10 Essential (primary) hypertension: Secondary | ICD-10-CM | POA: Diagnosis not present

## 2018-10-30 DIAGNOSIS — E1142 Type 2 diabetes mellitus with diabetic polyneuropathy: Secondary | ICD-10-CM | POA: Diagnosis not present

## 2018-10-30 DIAGNOSIS — Z7984 Long term (current) use of oral hypoglycemic drugs: Secondary | ICD-10-CM | POA: Diagnosis not present

## 2018-10-30 LAB — COMPLETE METABOLIC PANEL WITH GFR
AG Ratio: 1.3 (calc) (ref 1.0–2.5)
ALT: 9 U/L (ref 6–29)
AST: 11 U/L (ref 10–35)
Albumin: 3.5 g/dL — ABNORMAL LOW (ref 3.6–5.1)
Alkaline phosphatase (APISO): 65 U/L (ref 37–153)
BUN/Creatinine Ratio: 33 (calc) — ABNORMAL HIGH (ref 6–22)
BUN: 16 mg/dL (ref 7–25)
CO2: 32 mmol/L (ref 20–32)
Calcium: 9.3 mg/dL (ref 8.6–10.4)
Chloride: 100 mmol/L (ref 98–110)
Creat: 0.49 mg/dL — ABNORMAL LOW (ref 0.60–0.88)
GFR, Est African American: 106 mL/min/{1.73_m2} (ref >=60)
GFR, Est Non African American: 91 mL/min/{1.73_m2} (ref >=60)
Globulin: 2.8 g/dL (calc) (ref 1.9–3.7)
Glucose, Bld: 141 mg/dL — ABNORMAL HIGH (ref 65–99)
Potassium: 4.5 mmol/L (ref 3.5–5.3)
Sodium: 138 mmol/L (ref 135–146)
Total Bilirubin: 0.4 mg/dL (ref 0.2–1.2)
Total Protein: 6.3 g/dL (ref 6.1–8.1)

## 2018-10-30 LAB — CBC WITH DIFFERENTIAL/PLATELET
Absolute Monocytes: 383 cells/uL (ref 200–950)
Basophils Absolute: 22 cells/uL (ref 0–200)
Basophils Relative: 0.5 %
Eosinophils Absolute: 22 cells/uL (ref 15–500)
Eosinophils Relative: 0.5 %
HCT: 35.2 % (ref 35.0–45.0)
Hemoglobin: 11.4 g/dL — ABNORMAL LOW (ref 11.7–15.5)
Lymphs Abs: 1615 cells/uL (ref 850–3900)
MCH: 29.3 pg (ref 27.0–33.0)
MCHC: 32.4 g/dL (ref 32.0–36.0)
MCV: 90.5 fL (ref 80.0–100.0)
MPV: 9.2 fL (ref 7.5–12.5)
Monocytes Relative: 8.7 %
Neutro Abs: 2358 cells/uL (ref 1500–7800)
Neutrophils Relative %: 53.6 %
Platelets: 242 10*3/uL (ref 140–400)
RBC: 3.89 10*6/uL (ref 3.80–5.10)
RDW: 12.1 % (ref 11.0–15.0)
Total Lymphocyte: 36.7 %
WBC: 4.4 10*3/uL (ref 3.8–10.8)

## 2018-11-01 ENCOUNTER — Other Ambulatory Visit: Payer: Medicare Other | Admitting: *Deleted

## 2018-11-01 ENCOUNTER — Other Ambulatory Visit: Payer: Medicare Other | Admitting: Licensed Clinical Social Worker

## 2018-11-01 ENCOUNTER — Other Ambulatory Visit: Payer: Self-pay

## 2018-11-01 ENCOUNTER — Telehealth: Payer: Self-pay

## 2018-11-01 DIAGNOSIS — Z515 Encounter for palliative care: Secondary | ICD-10-CM

## 2018-11-01 NOTE — Telephone Encounter (Signed)
Patients home health nurse [place] called to request an antibiotic for the patient because she feels as though her wound has become infected and she will be using Dakins solution on wound                please advise

## 2018-11-01 NOTE — Progress Notes (Signed)
COMMUNITY PALLIATIVE CARE SW NOTE  PATIENT NAME: [name] DOB: 14-Jan-1936 MRN: [id number]  PRIMARY CARE PROVIDER: [name], NP  RESPONSIBLE PARTY:  Acct ID - Guarantor Home Phone Work Phone Relationship Acct Type  [id number] [name], [phone]  Self P/F     [address], [place], [place] [address]   Due to the COVID-19 crisis, this virtual check-in visit was done via telephone from my office and it was initiated and consent given by this patient and or family.  PLAN OF CARE and INTERVENTIONS:             1. GOALS OF CARE/ ADVANCE CARE PLANNING:  The goal for patient is to remain in her son's home.  She is a DNR. 2. SOCIAL/EMOTIONAL/SPIRITUAL ASSESSMENT/ INTERVENTIONS: SW and Palliative Care RN, [name], conducted a virtual check-in visit with patient's primary caregiver and daughter-in-law, [name].  SW provided active listening while [name] discussed patient's current medical issues.  Patient is having an issue with a wound on her right leg.  3. PATIENT/CAREGIVER EDUCATION/ COPING:  Patient's caregiver expresses her feelings openly. 4. PERSONAL EMERGENCY PLAN:  Family will contact EMS. 5. COMMUNITY RESOURCES COORDINATION/ HEALTH CARE NAVIGATION:  Patient has hired caregivers 24/7. 6. FINANCIAL/LEGAL CONCERNS/INTERVENTIONS:  None.     SOCIAL HX:  Social History   Tobacco Use  . Smoking status: Never Smoker  . Smokeless tobacco: Never Used  Substance Use Topics  . Alcohol use: No    CODE STATUS:  DNR ADVANCED DIRECTIVES: LW and HCPOA MOST FORM COMPLETE:  No HOSPICE EDUCATION PROVIDED:  No PPS:  Patient's appetite is normal per [name].  Patient is dependent for all of her ADSs. Duration of visit and documentation:  60 minutes.      [name] Carolyn Santoni, LCSW

## 2018-11-01 NOTE — Telephone Encounter (Signed)
Nurse called back I asked her if she wanted to schedule an appointment for the patient at this time she said no she would have to speak with patients care giver first and they would call back to schedule appointment

## 2018-11-01 NOTE — Telephone Encounter (Signed)
Pt will need an office visit to evaluate this

## 2018-11-02 ENCOUNTER — Other Ambulatory Visit: Payer: Self-pay

## 2018-11-02 ENCOUNTER — Encounter (HOSPITAL_COMMUNITY): Payer: Self-pay

## 2018-11-02 ENCOUNTER — Ambulatory Visit (INDEPENDENT_AMBULATORY_CARE_PROVIDER_SITE_OTHER): Payer: Medicare Other | Admitting: Family

## 2018-11-02 ENCOUNTER — Encounter: Payer: Self-pay | Admitting: Family

## 2018-11-02 ENCOUNTER — Emergency Department (HOSPITAL_COMMUNITY)
Admission: EM | Admit: 2018-11-02 | Discharge: 2018-11-02 | Disposition: A | Discharge status: Home / Self Care | Payer: Medicare Other | Attending: Emergency Medicine | Admitting: Emergency Medicine

## 2018-11-02 VITALS — BP 128/70 | HR 68 | Temp 97.8°F

## 2018-11-02 DIAGNOSIS — E785 Hyperlipidemia, unspecified: Secondary | ICD-10-CM | POA: Diagnosis not present

## 2018-11-02 DIAGNOSIS — L02419 Cutaneous abscess of limb, unspecified: Secondary | ICD-10-CM

## 2018-11-02 DIAGNOSIS — L89153 Pressure ulcer of sacral region, stage 3: Secondary | ICD-10-CM | POA: Diagnosis not present

## 2018-11-02 DIAGNOSIS — E1169 Type 2 diabetes mellitus with other specified complication: Secondary | ICD-10-CM | POA: Insufficient documentation

## 2018-11-02 DIAGNOSIS — L03115 Cellulitis of right lower limb: Secondary | ICD-10-CM | POA: Insufficient documentation

## 2018-11-02 DIAGNOSIS — L02415 Cutaneous abscess of right lower limb: Secondary | ICD-10-CM

## 2018-11-02 DIAGNOSIS — Z79899 Other long term (current) drug therapy: Secondary | ICD-10-CM | POA: Diagnosis not present

## 2018-11-02 DIAGNOSIS — M79661 Pain in right lower leg: Secondary | ICD-10-CM | POA: Diagnosis present

## 2018-11-02 DIAGNOSIS — F0390 Unspecified dementia without behavioral disturbance: Secondary | ICD-10-CM | POA: Insufficient documentation

## 2018-11-02 DIAGNOSIS — Z7984 Long term (current) use of oral hypoglycemic drugs: Secondary | ICD-10-CM | POA: Diagnosis not present

## 2018-11-02 DIAGNOSIS — E1142 Type 2 diabetes mellitus with diabetic polyneuropathy: Secondary | ICD-10-CM

## 2018-11-02 DIAGNOSIS — L03119 Cellulitis of unspecified part of limb: Secondary | ICD-10-CM

## 2018-11-02 LAB — BASIC METABOLIC PANEL
Anion gap: 11 (ref 5–15)
BUN: 15 mg/dL (ref 8–23)
CO2: 27 mmol/L (ref 22–32)
Calcium: 9.1 mg/dL (ref 8.9–10.3)
Chloride: 100 mmol/L (ref 98–111)
Creatinine, Ser: 0.36 mg/dL — ABNORMAL LOW (ref 0.44–1.00)
GFR calc Af Amer: >60 mL/min (ref >60)
GFR calc non Af Amer: >60 mL/min (ref >60)
Glucose, Bld: 181 mg/dL — ABNORMAL HIGH (ref 70–99)
Potassium: 4.6 mmol/L (ref 3.5–5.1)
Sodium: 138 mmol/L (ref 135–145)

## 2018-11-02 LAB — BLOOD GAS, VENOUS
Acid-Base Excess: 5.2 mmol/L — ABNORMAL HIGH (ref 0.0–2.0)
Bicarbonate: 30.6 mmol/L — ABNORMAL HIGH (ref 20.0–28.0)
FIO2: 21
O2 Saturation: 40.1 %
Patient temperature: 98.6
pCO2, Ven: 50.9 mmHg (ref 44.0–60.0)
pH, Ven: 7.397 (ref 7.250–7.430)

## 2018-11-02 LAB — CBC WITH DIFFERENTIAL/PLATELET
Abs Immature Granulocytes: 0.03 10*3/uL (ref 0.00–0.07)
Basophils Absolute: 0 10*3/uL (ref 0.0–0.1)
Basophils Relative: 0 %
Eosinophils Absolute: 0 10*3/uL (ref 0.0–0.5)
Eosinophils Relative: 0 %
HCT: 38.3 % (ref 36.0–46.0)
Hemoglobin: 11.8 g/dL — ABNORMAL LOW (ref 12.0–15.0)
Immature Granulocytes: 1 %
Lymphocytes Relative: 19 %
Lymphs Abs: 1.2 10*3/uL (ref 0.7–4.0)
MCH: 29.5 pg (ref 26.0–34.0)
MCHC: 30.8 g/dL (ref 30.0–36.0)
MCV: 95.8 fL (ref 80.0–100.0)
Monocytes Absolute: 0.8 10*3/uL (ref 0.1–1.0)
Monocytes Relative: 12 %
Neutro Abs: 4.4 10*3/uL (ref 1.7–7.7)
Neutrophils Relative %: 68 %
Platelets: 212 10*3/uL (ref 150–400)
RBC: 4 MIL/uL (ref 3.87–5.11)
RDW: 13 % (ref 11.5–15.5)
WBC: 6.5 10*3/uL (ref 4.0–10.5)
nRBC: 0 % (ref 0.0–0.2)

## 2018-11-02 MED ORDER — SULFAMETHOXAZOLE-TRIMETHOPRIM 200-40 MG/5ML PO SUSP: 20.0000 mL | Freq: Two times a day (BID) | ORAL | 0 refills | Status: DC

## 2018-11-02 MED ORDER — SULFAMETHOXAZOLE-TRIMETHOPRIM 200-40 MG/5ML PO SUSP
160.0000 mg | Freq: Once | ORAL | Status: AC
  Administered 2018-11-02: 16:00:00 160 mg via ORAL
  Filled 2018-11-02: qty 20

## 2018-11-02 MED ORDER — LIDOCAINE-EPINEPHRINE 2 %-1:100000 IJ SOLN
10.0000 mL | Freq: Once | INTRAMUSCULAR | Status: AC
  Administered 2018-11-02: 10 mL via INTRADERMAL
  Filled 2018-11-02: qty 1

## 2018-11-02 MED ORDER — SULFAMETHOXAZOLE-TRIMETHOPRIM 200-40 MG/5ML PO SUSP: 20.0000 mL | Freq: Two times a day (BID) | ORAL | 0 refills | Status: AC

## 2018-11-02 NOTE — ED Provider Notes (Signed)
[facility] DEPT Provider Note  CSN: [id number] Arrival date & time: 11/02/18 1238  Chief Complaint(s) leg wound  HPI [name] is a 83 y.o. female with h/o dementia and DM here for right leg wound  HPI  3 weeks ago family noted had abrasion. Over the last week, it became infected which worsened over the last several days.  She has associated pain.  Remainder of history, ROS, and physical exam limited due to patient's condition (dementia). Additional information was obtained from family.   Level V Caveat.   Past Medical History Past Medical History:  Diagnosis Date  . Dementia ([place])   . Diabetes mellitus type II 03/21/2011  . DVT (deep venous thrombosis) ([place]) 03/21/2011  . Hyperlipidemia 03/21/2011  . Hypertension 03/21/2011  . Lymphedema of leg 03/21/2011  . Pulmonary embolism ([place]) 03/21/2011  . Stroke [facility])    Patient Active Problem List   Diagnosis Date Noted  . Skin ulcer of sacrum with fat layer exposed ([place]) 07/22/2017  . Lipoma of left lower extremity 07/22/2017  . Estrogen deficiency 07/22/2017  . Hyperlipidemia associated with type 2 diabetes mellitus ([place]) 07/22/2017  . History of TIA (transient ischemic attack) 01/10/2017  . Atrial septal aneurysm 11/22/2016  . Bilateral lower extremity edema 11/22/2016  . Iron deficiency anemia due to chronic blood loss 11/16/2016  . TIA (transient ischemic attack) 10/05/2016  . Lower urinary tract infectious disease   . Current use of long term anticoagulation 08/24/2016  . Metabolic encephalopathy 40/98/1191  . Senile dementia without behavioral disturbance ([place]) 08/23/2016  . Sepsis ([place]) 08/23/2016  . Acute CVA (cerebrovascular accident) ([place])   . Left-sided weakness 05/11/2016  . Dysarthria 05/11/2016  . Acute left-sided weakness 05/11/2016  . History of pulmonary embolism 03/21/2011  . History of DVT (deep vein thrombosis) 03/21/2011  . Type 2 diabetes mellitus with diabetic  polyneuropathy, without long-term current use of insulin ([place]) 03/21/2011  . Lymphedema of leg 03/21/2011  . Hyperlipidemia 03/21/2011  . Hypertension 03/21/2011   Home Medication(s) Prior to Admission medications   Medication Sig Start Date End Date Taking? Authorizing Provider  ACCU-CHEK SOFTCLIX LANCETS lancets 1 each by Other route daily. Use as instructed Dx: E11.9 10/18/17   Gildardo Cranker, DO  acetaminophen (TYLENOL) 500 MG tablet Take 500 mg by mouth as needed (general pain).     [provider]  atorvastatin (LIPITOR) 20 MG tablet Take 1 tablet (20 mg total) by mouth daily at 6 PM. 06/19/18   Dewaine Oats, Carlos American, NP  Cholecalciferol (VITAMIN D-3) 125 MCG (5000 UT) TABS Take 5,000 Units by mouth daily.    [provider]  cloNIDine (CATAPRES) 0.1 MG tablet Take 1 tablet (0.1 mg total) by mouth every 8 (eight) hours. May take an additional tablet if SBP >170 06/19/18   [name], NP  diclofenac sodium (VOLTAREN) 1 % GEL Apply 1 application topically 2 (two) times daily.     [provider]  gabapentin (NEURONTIN) 300 MG capsule Take 1 capsule (300 mg total) by mouth 2 (two) times daily. 06/19/18   [name], NP  glucose blood (ACCU-CHEK AVIVA PLUS) test strip 1 each by Other route 2 (two) times daily. E11.9 10/18/17   Gildardo Cranker, DO  losartan (COZAAR) 100 MG tablet Take 1 tablet (100 mg total) by mouth daily. 08/21/18   [name], NP  metFORMIN (GLUCOPHAGE) 500 MG tablet 1000 mg by mouth with breakfast and 500 mg by mouth with Dinner 09/10/18  [name], NP  polyethylene glycol (MIRALAX / GLYCOLAX) packet Take 17 g by mouth as needed.    [provider]  Rivaroxaban (XARELTO) 15 MG TABS tablet Take 1 tablet (15 mg total) by mouth daily with supper. 01/17/18   Gildardo Cranker, DO  sulfamethoxazole-trimethoprim (BACTRIM) 200-40 MG/5ML suspension Take 20 mLs by mouth 2 (two) times daily for 7 days. 11/02/18 11/09/18  Fatima Blank, MD  Vitamins A & D (VITAMIN A & D) ointment Apply 1 application topically as needed for dry skin.    [provider]  Wound Dressings ([place] EX) Apply to sacrum and change every 3 days or sooner as needed    [provider]                                                                                                                                    Past Surgical History Past Surgical History:  Procedure Laterality Date  . CHOLECYSTECTOMY  2015  . SKIN TAG REMOVAL  07/25/2016  . VAGINAL HYSTERECTOMY     unknown of date  . VASCULAR SURGERY     Family History Family History  Problem Relation Age of Onset  . Heart attack Mother   . COPD Sister   . Stroke Sister   . Bipolar disorder Daughter     Social History Social History   Tobacco Use  . Smoking status: Never Smoker  . Smokeless tobacco: Never Used  Substance Use Topics  . Alcohol use: No  . Drug use: No   Allergies Patient has no known allergies.  Review of Systems Review of Systems  Unable to perform ROS: Dementia    Physical Exam Vital Signs  I have reviewed the triage vital signs BP (!) 178/82 (BP Location: Left Arm)   Pulse 81   Temp 98.5 F (36.9 C) (Axillary)   Resp 18   Ht 5\' 7"  (1.702 m)   Wt 59 kg   SpO2 100%   BMI 20.36 kg/m   Physical Exam Vitals signs reviewed.  Constitutional:      General: She is not in acute distress.    Appearance: She is well-developed. She is not diaphoretic.  HENT:     Head: Normocephalic and atraumatic.     Right Ear: External ear normal.     Left Ear: External ear normal.     Nose: Nose normal.  Eyes:     General: No scleral icterus.    Conjunctiva/sclera: Conjunctivae normal.  Neck:     Musculoskeletal: Normal range of motion.     Trachea: Phonation normal.  Cardiovascular:     Rate and Rhythm: Normal rate and regular rhythm.  Pulmonary:     Effort: Pulmonary effort is normal. No respiratory distress.     Breath  sounds: No stridor.  Abdominal:     General: There is no distension.  Musculoskeletal: Normal range of motion.  Right lower leg: Edema present.     Left lower leg: Edema present.       Legs:  Neurological:     Mental Status: She is alert.  Psychiatric:        Behavior: Behavior normal.       ED Results and Treatments Labs (all labs ordered are listed, but only abnormal results are displayed) Labs Reviewed  CBC WITH DIFFERENTIAL/PLATELET - Abnormal; Notable for the following components:      Result Value   Hemoglobin 11.8 (*)    All other components within normal limits  BASIC METABOLIC PANEL - Abnormal; Notable for the following components:   Glucose, Bld 181 (*)    Creatinine, Ser 0.36 (*)    All other components within normal limits  BLOOD GAS, VENOUS - Abnormal; Notable for the following components:   Bicarbonate 30.6 (*)    Acid-Base Excess 5.2 (*)    All other components within normal limits                                                                                                                         EKG  EKG Interpretation  Date/Time:    Ventricular Rate:    PR Interval:    QRS Duration:   QT Interval:    QTC Calculation:   R Axis:     Text Interpretation:        Radiology No results found.  Pertinent labs & imaging results that were available during my care of the patient were reviewed by me and considered in my medical decision making (see chart for details).  Medications Ordered in ED Medications  sulfamethoxazole-trimethoprim (BACTRIM) 200-40 MG/5ML suspension 160 mg of trimethoprim (has no administration in time range)  lidocaine-EPINEPHrine (XYLOCAINE W/EPI) 2 %-1:100000 (with pres) injection 10 mL (10 mLs Intradermal Given by Other 11/02/18 1459)                                                                                                                                    Procedures .Marland KitchenIncision and Drainage  Date/Time: 11/02/2018  4:01 PM Performed by: Fatima Blank, MD Authorized by: Fatima Blank, MD   Consent:    Consent obtained:  Verbal   Consent given by:  Patient and guardian   Risks discussed:  Bleeding, incomplete drainage, pain and infection   Alternatives discussed:  Alternative treatment Location:  Type:  Abscess   Size:  5x7 cm   Location:  Lower extremity   Lower extremity location:  Leg   Leg location:  R lower leg Pre-procedure details:    Skin preparation:  Chloraprep Anesthesia (see MAR for exact dosages):    Anesthesia method:  Local infiltration   Local anesthetic:  Lidocaine 2% WITH epi Procedure type:    Complexity:  Complex Procedure details:    Incision types:  Elliptical   Incision depth:  Subcutaneous   Scalpel blade:  11   Wound management:  Probed and deloculated and extensive cleaning   Drainage:  Bloody and purulent   Drainage amount:  Copious   Wound treatment:  Wound left open   Packing materials:  1/4 in iodoform gauze Post-procedure details:    Patient tolerance of procedure:  Tolerated well, no immediate complications Ultrasound ED Soft Tissue  Date/Time: 11/02/2018 4:02 PM Performed by: Fatima Blank, MD Authorized by: Fatima Blank, MD   Procedure details:    Indications: localization of abscess     Transverse view:  Visualized   Longitudinal view:  Visualized   Images: archived   Location:    Location: lower extremity     Side:  Right Findings:     abscess present    cellulitis present    (including critical care time)  Medical Decision Making / ED Course I have reviewed the nursing notes for this encounter and the patient's prior records (if available in EHR or on provided paperwork).  Right leg abscess with overlying cellulitis confirmed by ultrasound.  I&D as above.  Labs reassuring.  Patient given first dose of Bactrim in the emergency department.  Will DC home with prescription for Bactrim.  2 to 3-day  follow-up for wound check and packing removal.      Final Clinical Impression(s) / ED Diagnoses Final diagnoses:  Abscess of right leg  Cellulitis of right lower extremity     The patient appears reasonably screened and/or stabilized for discharge and I doubt any other medical condition or other [facility] requiring further screening, evaluation, or treatment in the ED at this time prior to discharge.  Disposition: Discharge  Condition: Good  I have discussed the results, Dx and Tx plan with the patient and family who expressed understanding and agree(s) with the plan. Discharge instructions discussed at great length. The patient and family was given strict return precautions who verbalized understanding of the instructions. No further questions at time of discharge.    ED Discharge Orders         Ordered    sulfamethoxazole-trimethoprim (BACTRIM) 200-40 MG/5ML suspension  2 times daily,   Status:  Discontinued     11/02/18 1606    sulfamethoxazole-trimethoprim (BACTRIM) 200-40 MG/5ML suspension  2 times daily     11/02/18 1606           Follow Up: [name], NP [place]. [place] [address] [phone]   2-3 days, For wound re-check  [facility] DEPT [place] [phone] [place] [phone] Go to  in 2-3 days, For wound re-check if PCP is not able to see you     This chart was dictated using voice recognition software.  Despite best efforts to proofread,  errors can occur which can change the documentation meaning.   Fatima Blank, MD 11/02/18 [phone]

## 2018-11-02 NOTE — Patient Instructions (Signed)
Please get right leg cellulitis/abscess evaluated in the ED for possible I& D of the abscess.

## 2018-11-02 NOTE — Progress Notes (Signed)
Provider: Addilee Neu FNP-C  [name], NP  Patient Care Team: [name], NP as PCP - General (Geriatric Medicine) [name], MD as Consulting Physician (Oncology) [name], [name], LCSW as Social Worker (Licensed Clinical Social Worker) [name], RN as Registered Nurse [facility] and Palliative Medicine)  Extended Emergency Contact Information Primary Emergency Contact: Uriarte,[name] Address: [address]          [place], [place] [address] Johnnette Litter of [place] Phone: [phone] Relation: Relative Secondary Emergency Contact: Cottie Banda Address: [place], [place] [address] Johnnette Litter of [place] Phone: [phone] Relation: Son   Goals of care: Advanced Directive information Advanced Directives 09/10/2018  Does Patient Have a Medical Advance Directive? Yes  Type of Advance Directive [place]  Does patient want to make changes to medical advance directive? No - Patient declined  Copy of [place] in Chart? Yes - validated most recent copy scanned in chart (See row information)     Chief Complaint  Patient presents with   Acute Visit    RIGHT LEG WOUND    HPI:  Pt is a 83 y.o. female seen today for an acute visit for evaluation of right leg wound x 3 weeks.she is here with her daughter in law who provides the HPI information.I'm seeing her for the first time follows up with PCP [name] NP.she states patients right upper leg wound started as a small area [facility] Nurse who follows up for her sacral wound cleansed wound and applied ? ABX ointment.she has noticed that the wound has gotten bigger and draining more.Has also been swollen,red and painful to touch.No fever or chills reported.Care giver not sure whether patient's leg might have been caught on the wheelchair.  States sacral wound has improved. She has noticed that her CBG are running higher than usual in  the 200's usually readings are below 150. No recent contact with sick person with COVID-19     Past Medical History:  Diagnosis Date   Dementia ([place])    Diabetes mellitus type II 03/21/2011   DVT (deep venous thrombosis) ([place]) 03/21/2011   Hyperlipidemia 03/21/2011   Hypertension 03/21/2011   Lymphedema of leg 03/21/2011   Pulmonary embolism ([place]) 03/21/2011   Stroke [facility])    Past Surgical History:  Procedure Laterality Date   CHOLECYSTECTOMY  2015   SKIN TAG REMOVAL  07/25/2016   VAGINAL HYSTERECTOMY     unknown of date   VASCULAR SURGERY      No Known Allergies  Outpatient Encounter Medications as of 11/02/2018  Medication Sig   ACCU-CHEK SOFTCLIX LANCETS lancets 1 each by Other route daily. Use as instructed Dx: E11.9   acetaminophen (TYLENOL) 500 MG tablet Take 500 mg by mouth as needed (general pain).    atorvastatin (LIPITOR) 20 MG tablet Take 1 tablet (20 mg total) by mouth daily at 6 PM.   Cholecalciferol (VITAMIN D-3) 125 MCG (5000 UT) TABS Take 5,000 Units by mouth daily.   cloNIDine (CATAPRES) 0.1 MG tablet Take 1 tablet (0.1 mg total) by mouth every 8 (eight) hours. May take an additional tablet if SBP >170   diclofenac sodium (VOLTAREN) 1 % GEL Apply 1 application topically 2 (two) times daily.    gabapentin (NEURONTIN) 300 MG capsule Take 1 capsule (300 mg total) by mouth 2 (two) times daily.   glucose blood (ACCU-CHEK AVIVA PLUS) test strip 1 each  by Other route 2 (two) times daily. E11.9   losartan (COZAAR) 100 MG tablet Take 1 tablet (100 mg total) by mouth daily.   metFORMIN (GLUCOPHAGE) 500 MG tablet 1000 mg by mouth with breakfast and 500 mg by mouth with Dinner   polyethylene glycol (MIRALAX / GLYCOLAX) packet Take 17 g by mouth as needed.   Rivaroxaban (XARELTO) 15 MG TABS tablet Take 1 tablet (15 mg total) by mouth daily with supper.   Vitamins A & D (VITAMIN A & D) ointment Apply 1 application topically as needed for dry skin.     Wound Dressings (MEPILEX EX) Apply to sacrum and change every 3 days or sooner as needed   No facility-administered encounter medications on file as of 11/02/2018.     Review of Systems  Unable to perform ROS: Dementia (additional information provided by patient's daughter in law )  Constitutional: Negative for appetite change, chills, fatigue and fever.  Respiratory: Negative for cough, chest tightness, shortness of breath and wheezing.   Cardiovascular: Positive for leg swelling. Negative for chest pain and palpitations.  Gastrointestinal: Negative for abdominal distention, abdominal pain, diarrhea, nausea and vomiting.  Genitourinary:       Incontinent no signs of UTI   Musculoskeletal: Positive for gait problem.       Wheelchair bound requires transfer with assistance.   Skin: Positive for wound. Negative for color change, pallor and rash.       Right leg wound increased drainage,red,swollen and painful to touch    Psychiatric/Behavioral: Negative for agitation and sleep disturbance. The patient is not nervous/anxious.        No increase in confusion     There is no immunization history on file for this patient. Pertinent  Health Maintenance Due  Topic Date Due   FOOT EXAM  08/08/1945   OPHTHALMOLOGY EXAM  01/24/2018   PNA vac Low Risk Adult (1 of 2 - PCV13) 08/21/2019 (Originally 08/08/2002)   HEMOGLOBIN A1C  03/05/2019   DEXA SCAN  Completed   INFLUENZA VACCINE  Discontinued   Fall Risk  10/24/2018 10/24/2018 09/10/2018 08/21/2018 07/20/2018  Falls in the past year? 1 1 0 1 1  Number falls in past yr: 0 0 0 1 1  Comment - - - - -  Injury with Fall? 0 0 0 0 0  Risk Factor Category  - - - - -  Risk for fall due to : - - - History of fall(s);Impaired mobility -  Follow up - - - - -    Vitals:   11/02/18 1049  BP: 128/70  Pulse: 68  Temp: 97.8 F (36.6 C)  TempSrc: Oral   There is no height or weight on file to calculate BMI. Physical Exam Vitals signs reviewed.   Constitutional:      General: She is not in acute distress.    Appearance: She is not ill-appearing.     Comments: Wheelchair bound   HENT:     Nose: No congestion or rhinorrhea.     Mouth/Throat:     Mouth: Mucous membranes are moist.     Pharynx: Oropharynx is clear. No oropharyngeal exudate or posterior oropharyngeal erythema.  Eyes:     General: No scleral icterus.       Right eye: No discharge.        Left eye: No discharge.     Conjunctiva/sclera: Conjunctivae normal.     Pupils: Pupils are equal, round, and reactive to light.  Cardiovascular:  Rate and Rhythm: Normal rate. Rhythm irregular.     Pulses: Normal pulses.     Heart sounds: Normal heart sounds. No murmur. No friction rub. No gallop.   Pulmonary:     Effort: Pulmonary effort is normal. No respiratory distress.     Breath sounds: Normal breath sounds. No wheezing, rhonchi or rales.  Chest:     Chest wall: No tenderness.  Abdominal:     General: Bowel sounds are normal. There is no distension.     Palpations: Abdomen is soft. There is no mass.     Tenderness: There is no abdominal tenderness. There is no right CVA tenderness, left CVA tenderness, guarding or rebound.  Skin:    General: Skin is warm.     Coloration: Skin is not jaundiced or pale.     Findings: No bruising or rash.     Comments: Right lateral upper leg wound large area skin red,swollen,tender to touch with thick pussy drainage.Moderate amounts of drainage noted on old dressing. Wound cleanse with wound cleanser,pat dry and covered with foam dressing for absorption and secured with Kerlix and paper tape.   Neurological:     Mental Status: She is alert. Mental status is at baseline.     Gait: Gait abnormal.  Psychiatric:        Mood and Affect: Mood normal.        Behavior: Behavior is cooperative.        Cognition and Memory: Cognition is impaired. Memory is impaired.    Labs reviewed: Recent Labs    06/18/18 1120 09/03/18 1143  10/29/18 1034  NA 140 139 138  K 4.3 4.4 4.5  CL 101 98 100  CO2 32 33* 32  GLUCOSE 215* 187* 141*  BUN 25* 18 16  CREATININE 0.56 0.49 0.49*  CALCIUM 9.9 10.4* 9.3   Recent Labs    05/15/18 1727 06/18/18 1120 09/03/18 1143 10/29/18 1034  AST 16 12* 11* 11  ALT 11 9 9 9   ALKPHOS 49 59 71  --   BILITOT 0.6 0.3 0.4 0.4  PROT 6.6 6.6 7.5 6.3  ALBUMIN 2.9* 3.7 4.1  --    Recent Labs    06/18/18 1120 09/03/18 1143 10/29/18 1034  WBC 5.1 4.3 4.4  NEUTROABS 2.9 2.4 2,358  HGB 11.3* 12.3 11.4*  HCT 36.7 40.1 35.2  MCV 95.8 93.0 90.5  PLT 235 219 242   Lab Results  Component Value Date   TSH 2.39 01/10/2017   Lab Results  Component Value Date   HGBA1C 8.2 (H) 09/03/2018   Lab Results  Component Value Date   CHOL 173 09/03/2018   HDL 52 09/03/2018   LDLCALC 103 (H) 09/03/2018   TRIG 90 09/03/2018   CHOLHDL 3.3 09/03/2018    Significant Diagnostic Results in last 30 days:  No results found.  Assessment/Plan 1. Cellulitis and abscess of lower extremity Afebrile.right lateral upper leg wound with thick drainage entire surrounding area red,swollen and  tender to touch.I've discussed with patient's care giver to take her to ED for possible incision and drainage of wound and antibiotics.  2. Type 2 diabetes mellitus with diabetic polyneuropathy, without long-term current use of insulin (HCC) CBG running higher than usual possible due to right leg wound.I've recommended to follow up in the ED for wound evaluation.continue current    3. Pressure ulcer of sacral region, stage 3 ([place]) Not visualized during visit patient sitting on wheelchair.ulcer has improved per care giver.   Family/  staff Communication: Reviewed plan of care with patient and daughter in law   Labs/tests ordered: send to ED to be taken by daughter in law.    Sandrea Hughs, NP

## 2018-11-02 NOTE — ED Triage Notes (Signed)
Patient went to see PCP today for right lateral calf wound today. Patient was sent for further evaluation to the ED for antibiotics and possible drainage of the wound.

## 2018-11-02 NOTE — ED Notes (Signed)
RN applied telfa dressing for base and we are awaiting bactrim suspension

## 2018-11-02 NOTE — ED Notes (Signed)
O2 sensor wouldn't read her oxygen level most likely due to her hands being cold

## 2018-11-05 ENCOUNTER — Ambulatory Visit (INDEPENDENT_AMBULATORY_CARE_PROVIDER_SITE_OTHER): Payer: Medicare Other | Admitting: Family

## 2018-11-05 ENCOUNTER — Other Ambulatory Visit: Payer: Self-pay

## 2018-11-05 ENCOUNTER — Encounter: Payer: Self-pay | Admitting: Family

## 2018-11-05 VITALS — BP 138/82 | HR 79 | Temp 98.7°F | Ht 67.0 in

## 2018-11-05 DIAGNOSIS — L02419 Cutaneous abscess of limb, unspecified: Secondary | ICD-10-CM

## 2018-11-05 DIAGNOSIS — L03119 Cellulitis of unspecified part of limb: Secondary | ICD-10-CM | POA: Diagnosis not present

## 2018-11-05 MED ORDER — SACCHAROMYCES BOULARDII 250 MG PO CAPS: 250.0000 mg | ORAL_CAPSULE | Freq: Two times a day (BID) | ORAL | 0 refills | Status: AC

## 2018-11-05 NOTE — Progress Notes (Signed)
Provider: Keagon Glascoe FNP-C  [name], NP  Patient Care Team: [name], NP as PCP - General (Geriatric Medicine) [name], MD as Consulting Physician (Oncology) [name], [name], LCSW as Social Worker (Licensed Clinical Social Worker) [name], RN as Registered Nurse [facility] and Palliative Medicine)  Extended Emergency Contact Information Primary Emergency Contact: Schouten,[name] Address: [address]          [place], [place] [address] Johnnette Litter of [place] Phone: [phone] Relation: Relative Secondary Emergency Contact: Cottie Banda Address: [address]          [place], [place] [address] Johnnette Litter of [place] Phone: [phone] Relation: Son   Goals of care: Advanced Directive information Advanced Directives 11/02/2018  Does Patient Have a Medical Advance Directive? Yes  Type of Paramedic of [place];Living will  Does patient want to make changes to medical advance directive? -  Copy of [place] in Chart? -     Chief Complaint  Patient presents with  . Follow-up    Follow up from ER visit on 11/02/2018 for wound on leg 0    HPI:  Pt is a 83 y.o. female seen today for an acute visit for follow up ED visit for right leg abscess and cellulitis.she was here 11/02/2018 with right leg abscess/cellulitis and I send to ED for I&D.Blood work done in the ED WBC within normal range.she was seen in the ED and I&D was done wound packed with iodofoam and covered with gauze.she was discharged home on oral antibiotics Bactrim twice daily.she was told to follow up with [facility] today for evaluation of wound.patient's daughter states patient's right leg pain seems to have improved after I&D.No fever or chills noted.she has noticed small amounts of drainage on the dressing.Patient still has Home health Nurse who changes sacral wound dressing.     Past Medical History:  Diagnosis Date  .  Dementia ([place])   . Diabetes mellitus type II 03/21/2011  . DVT (deep venous thrombosis) ([place]) 03/21/2011  . Hyperlipidemia 03/21/2011  . Hypertension 03/21/2011  . Lymphedema of leg 03/21/2011  . Pulmonary embolism ([place]) 03/21/2011  . Stroke [facility])    Past Surgical History:  Procedure Laterality Date  . CHOLECYSTECTOMY  2015  . SKIN TAG REMOVAL  07/25/2016  . VAGINAL HYSTERECTOMY     unknown of date  . VASCULAR SURGERY      No Known Allergies  Outpatient Encounter Medications as of 11/05/2018  Medication Sig  . ACCU-CHEK SOFTCLIX LANCETS lancets 1 each by Other route daily. Use as instructed Dx: E11.9  . acetaminophen (TYLENOL) 500 MG tablet Take 500 mg by mouth as needed (general pain).   Marland Kitchen atorvastatin (LIPITOR) 20 MG tablet Take 1 tablet (20 mg total) by mouth daily at 6 PM.  . Cholecalciferol (VITAMIN D-3) 125 MCG (5000 UT) TABS Take 5,000 Units by mouth daily.  . cloNIDine (CATAPRES) 0.1 MG tablet Take 1 tablet (0.1 mg total) by mouth every 8 (eight) hours. May take an additional tablet if SBP >170  . diclofenac sodium (VOLTAREN) 1 % GEL Apply 1 application topically 2 (two) times daily.   Marland Kitchen gabapentin (NEURONTIN) 300 MG capsule Take 1 capsule (300 mg total) by mouth 2 (two) times daily.  Marland Kitchen glucose blood (ACCU-CHEK AVIVA PLUS) test strip 1 each by Other route 2 (two) times daily. E11.9  . losartan (COZAAR) 25 MG tablet Take 25 mg by mouth daily. Take along with 50 mg to  equal 75  . losartan (COZAAR) 50 MG tablet Take 50 mg by mouth daily. Take 1 along with 25 mg to equal 75 mg total  . metFORMIN (GLUCOPHAGE) 500 MG tablet 1000 mg by mouth with breakfast and 500 mg by mouth with Dinner  . polyethylene glycol (MIRALAX / GLYCOLAX) packet Take 17 g by mouth as needed.  . Rivaroxaban (XARELTO) 15 MG TABS tablet Take 1 tablet (15 mg total) by mouth daily with supper.  . sulfamethoxazole-trimethoprim (BACTRIM) 200-40 MG/5ML suspension Take 20 mLs by mouth 2 (two) times daily for 7  days.  . Vitamins A & D (VITAMIN A & D) ointment Apply 1 application topically as needed for dry skin.  . Wound Dressings (MEPILEX EX) Apply to sacrum and change every 3 days or sooner as needed  . [DISCONTINUED] losartan (COZAAR) 100 MG tablet Take 1 tablet (100 mg total) by mouth daily. (Patient taking differently: Take 75 mg by mouth daily. )   No facility-administered encounter medications on file as of 11/05/2018.     Review of Systems  Unable to perform ROS: Dementia (HPI information provided by daughter in law )     There is no immunization history on file for this patient. Pertinent  Health Maintenance Due  Topic Date Due  . FOOT EXAM  08/08/1945  . OPHTHALMOLOGY EXAM  01/24/2018  . PNA vac Low Risk Adult (1 of 2 - PCV13) 08/21/2019 (Originally 08/08/2002)  . HEMOGLOBIN A1C  03/05/2019  . DEXA SCAN  Completed  . INFLUENZA VACCINE  Discontinued   Fall Risk  11/05/2018 10/24/2018 10/24/2018 09/10/2018 08/21/2018  Falls in the past year? 0 1 1 0 1  Number falls in past yr: 0 0 0 0 1  Comment - - - - -  Injury with Fall? 0 0 0 0 0  Risk Factor Category  - - - - -  Risk for fall due to : - - - - History of fall(s);Impaired mobility  Follow up - - - - -    Vitals:   11/05/18 1557  BP: 138/82  Pulse: 79  Temp: 98.7 F (37.1 C)  TempSrc: Oral  SpO2: 95%  Height: 5\' 7"  (1.702 m)   Body mass index is 20.36 kg/m. Physical Exam Vitals signs reviewed.  Constitutional:      General: She is not in acute distress.    Appearance: She is normal weight. She is not ill-appearing.  HENT:     Nose: No congestion or rhinorrhea.     Mouth/Throat:     Mouth: Mucous membranes are moist.     Pharynx: Oropharynx is clear. No oropharyngeal exudate or posterior oropharyngeal erythema.  Eyes:     General: No scleral icterus.       Right eye: No discharge.        Left eye: No discharge.     Conjunctiva/sclera: Conjunctivae normal.     Pupils: Pupils are equal, round, and reactive to light.   Cardiovascular:     Rate and Rhythm: Normal rate and regular rhythm.     Pulses: Normal pulses.     Heart sounds: Normal heart sounds. No murmur. No friction rub. No gallop.   Pulmonary:     Effort: Pulmonary effort is normal. No respiratory distress.     Breath sounds: Normal breath sounds. No wheezing, rhonchi or rales.  Chest:     Chest wall: No tenderness.  Abdominal:     General: Bowel sounds are normal. There is no distension.  Palpations: Abdomen is soft. There is no mass.     Tenderness: There is no abdominal tenderness. There is no right CVA tenderness, left CVA tenderness, guarding or rebound.  Musculoskeletal:        General: No swelling or tenderness.     Right lower leg: No edema.     Left lower leg: No edema.     Comments: On wheelchair during visit.   Skin:    General: Skin is warm and dry.     Coloration: Skin is not pale.     Findings: No bruising, erythema or rash.     Comments: Right upper lateral leg wound old dressing with bloody drainage.dressing irrigated with saline for easy removal.wound cleansed with saline,pat dry and packed with Iodofoam strips per ED orders and covered with 4 X 4 and secured with Kerlix and paper tape. Surrounding skin redness has improved. Sacral wound not visualized patient on wheelchair.healing well per daughter.   Neurological:     Mental Status: She is alert. Mental status is at baseline.     Sensory: No sensory deficit.     Motor: No weakness.     Gait: Gait abnormal.  Psychiatric:        Mood and Affect: Mood normal.        Behavior: Behavior normal.        Thought Content: Thought content normal.        Judgment: Judgment normal.    Labs reviewed: Recent Labs    09/03/18 1143 10/29/18 1034 11/02/18 1445  NA 139 138 138  K 4.4 4.5 4.6  CL 98 100 100  CO2 33* 32 27  GLUCOSE 187* 141* 181*  BUN 18 16 15   CREATININE 0.49 0.49* 0.36*  CALCIUM 10.4* 9.3 9.1   Recent Labs    05/15/18 1727 06/18/18 1120 09/03/18  1143 10/29/18 1034  AST 16 12* 11* 11  ALT 11 9 9 9   ALKPHOS 49 59 71  --   BILITOT 0.6 0.3 0.4 0.4  PROT 6.6 6.6 7.5 6.3  ALBUMIN 2.9* 3.7 4.1  --    Recent Labs    09/03/18 1143 10/29/18 1034 11/02/18 1445  WBC 4.3 4.4 6.5  NEUTROABS 2.4 2,358 4.4  HGB 12.3 11.4* 11.8*  HCT 40.1 35.2 38.3  MCV 93.0 90.5 95.8  PLT 219 242 212   Lab Results  Component Value Date   TSH 2.39 01/10/2017   Lab Results  Component Value Date   HGBA1C 8.2 (H) 09/03/2018   Lab Results  Component Value Date   CHOL 173 09/03/2018   HDL 52 09/03/2018   LDLCALC 103 (H) 09/03/2018   TRIG 90 09/03/2018   CHOLHDL 3.3 09/03/2018    Significant Diagnostic Results in last 30 days:  No results found.  Assessment/Plan  Cellulitis and abscess of lower extremity Afebrile.Right upper lateral leg wound old dressing with bloody drainage.dressing irrigated with saline for easy removal.wound cleansed with saline,pat dry and packed with Iodofoam strips per ED orders and covered with 4 X 4 and secured with Kerlix and paper tape. Surrounding skin redness has improved.wound tender with packing but tolerated procedure well. - Discussed with patient's daughter to give patient extra strength Tylenol 1000 mg tablet in the morning and evening and 500 mg tablet daily at 2 pm for pain. - wound management care orders written and faxed to [facility] Nurse to cleanse right leg wound with saline,pat dry.cover with 4X 4 gauze then secure with Kerlix.change dressing three  time per week. -  Add florastor 250 mg capsule one by mouth twice daily x 10 days to prevent antibiotic associated diarrhea -  Notify provider's office if worsening drainage,odor from wound or redness.  Family/ staff Communication: Reviewed plan of care with patient and patient's daughter.  Labs/tests ordered: None  Next appointment:  1 week for follow up right leg wound.  Time spent with patient 25 minutes >50% time spent counseling;  reviewing medical record; tests; labs; and developing future plan of care  Sandrea Hughs, NP

## 2018-11-05 NOTE — Patient Instructions (Signed)
1. Cleanse right leg wound with saline pat dry,pack with Idofom gauze,cover with gauze and secure with Kerlix change dressing three time per week.   2. Add florastor 250 mg capsule one by mouth twice daily x 10 days to prevent antibiotic associated diarrhea  3. Notify provider's office if worsening drainage,odor from wound or redness.

## 2018-11-06 NOTE — Progress Notes (Signed)
COMMUNITY PALLIATIVE CARE RN NOTE  PATIENT NAME: [name] DOB: 04-29-1936 MRN: [id number]  PRIMARY CARE PROVIDER: [name], NP  RESPONSIBLE PARTY:  Acct ID - Guarantor Home Phone Work Phone Relationship Acct Type  [id number] CHESLEY, [phone]  Self P/F     [address] [place], [place] [address]   Due to the COVID-19 crisis, this virtual check-in visit was done via telephone from my office and it was initiated and consent by this patient and or family.  PLAN OF CARE and INTERVENTION:  1. ADVANCE CARE PLANNING/GOALS OF CARE: Goal is for patient to remain in the home with her family. She is a DNR.  2. PATIENT/CAREGIVER EDUCATION: Safe Transfers, Wound Management 3. DISEASE STATUS: Joint virtual check-in visit completed with Palliative Care SW, [name]. Spoke with patient's daughter-in-law [name]. She reports that her main concern today is a wound on patient's R lower leg. They noticed a scrape on this leg about 2 weeks ago. Unsure of cause. She feels that maybe it was scraped on her wheelchair during a transfer. The wound is getting worse and appears infected. She states that her home health nurse is scheduled to visit today for further assessment. Her sacral wounds are improving. [name] states that recently, patient's L knee was sore and swollen. An x-ray was taken and MD did not feel that patient needed a Cortisone injection. She used to receive this every 3 months, however her last injection was a year ago in July. She has been using cool compresses and Tylenol to help. Patient's BP has been better over the past few weeks. [name] sates that she felt that patient's HR was lower with higher dose of Lorartan so is currently giving patient 75 mg along with Clonidine. She also states that the home-health nurse has noticed an irregular heart rate. [name] did not want to pursue an EKG at this time. Her appetite is fair. Her CBG was 160 this am. She continues on Metformin. She  is total care for all ADLs. She is incontinent of both bowel and bladder and wears adult briefs. She is having issues with constipation. Her last BM was 6 days ago. She has not been giving patient Activia as regularly. She usually gives patient a Dulcolax tablet for constipation if she has gone several days w/o a BM. Reinforced making sure that patient does not go over 3 days without a BM. She had recent labwork this past Monday. Will continue to monitor.  HISTORY OF PRESENT ILLNESS:  This is a 83 yo female who resides in the home with her son and daughter in law. Palliative Care Team continues to follow patient. Next visit scheduled in 1 month.   CODE STATUS: DNR  ADVANCED DIRECTIVES: Y MOST FORM: no PPS: 30%   (Duration of visit and documentation 75 minutes)    Daryl Eastern, RN BSN

## 2018-11-07 DIAGNOSIS — Z7984 Long term (current) use of oral hypoglycemic drugs: Secondary | ICD-10-CM | POA: Diagnosis not present

## 2018-11-07 DIAGNOSIS — F0390 Unspecified dementia without behavioral disturbance: Secondary | ICD-10-CM | POA: Diagnosis not present

## 2018-11-07 DIAGNOSIS — E1142 Type 2 diabetes mellitus with diabetic polyneuropathy: Secondary | ICD-10-CM | POA: Diagnosis not present

## 2018-11-07 DIAGNOSIS — I890 Lymphedema, not elsewhere classified: Secondary | ICD-10-CM | POA: Diagnosis not present

## 2018-11-07 DIAGNOSIS — L89153 Pressure ulcer of sacral region, stage 3: Secondary | ICD-10-CM | POA: Diagnosis not present

## 2018-11-07 DIAGNOSIS — I10 Essential (primary) hypertension: Secondary | ICD-10-CM | POA: Diagnosis not present

## 2018-11-09 DIAGNOSIS — E1142 Type 2 diabetes mellitus with diabetic polyneuropathy: Secondary | ICD-10-CM | POA: Diagnosis not present

## 2018-11-09 DIAGNOSIS — F0390 Unspecified dementia without behavioral disturbance: Secondary | ICD-10-CM | POA: Diagnosis not present

## 2018-11-09 DIAGNOSIS — Z7984 Long term (current) use of oral hypoglycemic drugs: Secondary | ICD-10-CM | POA: Diagnosis not present

## 2018-11-09 DIAGNOSIS — I10 Essential (primary) hypertension: Secondary | ICD-10-CM | POA: Diagnosis not present

## 2018-11-09 DIAGNOSIS — I890 Lymphedema, not elsewhere classified: Secondary | ICD-10-CM | POA: Diagnosis not present

## 2018-11-09 DIAGNOSIS — L89153 Pressure ulcer of sacral region, stage 3: Secondary | ICD-10-CM | POA: Diagnosis not present

## 2018-11-12 ENCOUNTER — Ambulatory Visit (INDEPENDENT_AMBULATORY_CARE_PROVIDER_SITE_OTHER): Payer: Medicare Other | Admitting: Family

## 2018-11-12 ENCOUNTER — Encounter: Payer: Self-pay | Admitting: Family

## 2018-11-12 ENCOUNTER — Other Ambulatory Visit: Payer: Self-pay

## 2018-11-12 VITALS — BP 130/70 | HR 84 | Temp 98.2°F | Resp 18 | Ht 67.0 in

## 2018-11-12 DIAGNOSIS — L89151 Pressure ulcer of sacral region, stage 1: Secondary | ICD-10-CM

## 2018-11-12 DIAGNOSIS — S81801D Unspecified open wound, right lower leg, subsequent encounter: Secondary | ICD-10-CM | POA: Diagnosis not present

## 2018-11-12 NOTE — Patient Instructions (Signed)
1. Continue current right leg wound dressing changes.Notify [place] office if running any Temp >100.5,increased drainage from the wound,any odor or redness. 2. Home health Nurse to demonstrated wound care dressing change to family members to change once a week and as needed if soiled.

## 2018-11-12 NOTE — Progress Notes (Signed)
Provider: Naiomi Musto FNP-C  [name], NP  Patient Care Team: [name], NP as PCP - General (Geriatric Medicine) [name], MD as Consulting Physician (Oncology) [name], [name], LCSW as Social Worker (Licensed Clinical Social Worker) [name], RN as Registered Nurse [facility] and Palliative Medicine)  Extended Emergency Contact Information Primary Emergency Contact: Pedregon,[name] Address: [address]          [place], [place] [address] Johnnette Litter of [place] Phone: [phone] Relation: Relative Secondary Emergency Contact: Cottie Banda Address: [address]          [place], [place] [address] Johnnette Litter of [place] Phone: [phone] Relation: Son  Goals of care: Advanced Directive information Advanced Directives 11/02/2018  Does Patient Have a Medical Advance Directive? Yes  Type of Paramedic of [place];Living will  Does patient want to make changes to medical advance directive? -  Copy of [place] in Chart? -     Chief Complaint  Patient presents with  . Follow-up    1 week wound follow up     HPI:  Pt is a 83 y.o. female seen today for an acute visit for follow up right leg wound.she is status post I& D 11/02/2018 in the ED.Her POA states patient has completed her Bactrim oral antibiotics.Also states wound drainage has improved and redness has resolved.POA concerned states insurance only approved [place] Nurse to change dressing twice a week instead of three times with reomendation for POA to change dressing once a week on days HHN not coming in.POA states not comfortable packing wound with idofoam  but can change top dressing.she is willing to be shown how to change dressing. No fever or chills reported.      Past Medical History:  Diagnosis Date  . Dementia ([place])   . Diabetes mellitus type II 03/21/2011  . DVT (deep venous thrombosis) ([place]) 03/21/2011  . Hyperlipidemia  03/21/2011  . Hypertension 03/21/2011  . Lymphedema of leg 03/21/2011  . Pulmonary embolism ([place]) 03/21/2011  . Stroke [facility])    Past Surgical History:  Procedure Laterality Date  . CHOLECYSTECTOMY  2015  . SKIN TAG REMOVAL  07/25/2016  . VAGINAL HYSTERECTOMY     unknown of date  . VASCULAR SURGERY      No Known Allergies  Outpatient Encounter Medications as of 11/12/2018  Medication Sig  . ACCU-CHEK SOFTCLIX LANCETS lancets 1 each by Other route daily. Use as instructed Dx: E11.9  . acetaminophen (TYLENOL) 500 MG tablet Take 500 mg by mouth as needed (general pain).   Marland Kitchen atorvastatin (LIPITOR) 20 MG tablet Take 1 tablet (20 mg total) by mouth daily at 6 PM.  . Cholecalciferol (VITAMIN D-3) 125 MCG (5000 UT) TABS Take 5,000 Units by mouth daily.  . cloNIDine (CATAPRES) 0.1 MG tablet Take 1 tablet (0.1 mg total) by mouth every 8 (eight) hours. May take an additional tablet if SBP >170  . diclofenac sodium (VOLTAREN) 1 % GEL Apply 1 application topically 2 (two) times daily.   Marland Kitchen gabapentin (NEURONTIN) 300 MG capsule Take 1 capsule (300 mg total) by mouth 2 (two) times daily.  Marland Kitchen glucose blood (ACCU-CHEK AVIVA PLUS) test strip 1 each by Other route 2 (two) times daily. E11.9  . losartan (COZAAR) 25 MG tablet Take 25 mg by mouth daily. Take along with 50 mg to equal 75  . losartan (COZAAR) 50 MG tablet Take 50 mg by mouth daily. Take 1 along with 25  mg to equal 75 mg total  . metFORMIN (GLUCOPHAGE) 500 MG tablet 1000 mg by mouth with breakfast and 500 mg by mouth with Dinner  . polyethylene glycol (MIRALAX / GLYCOLAX) packet Take 17 g by mouth as needed.  . Rivaroxaban (XARELTO) 15 MG TABS tablet Take 1 tablet (15 mg total) by mouth daily with supper.  . saccharomyces boulardii (FLORASTOR) 250 MG capsule Take 1 capsule (250 mg total) by mouth 2 (two) times daily for 10 days.  . Vitamins A & D (VITAMIN A & D) ointment Apply 1 application topically as needed for dry skin.  . Wound Dressings  (MEPILEX EX) Apply to sacrum and change every 3 days or sooner as needed   No facility-administered encounter medications on file as of 11/12/2018.     Review of Systems  Unable to perform ROS: Dementia (additional information provided by daughter )   Pertinent  Health Maintenance Due  Topic Date Due  . FOOT EXAM  08/08/1945  . OPHTHALMOLOGY EXAM  01/24/2018  . PNA vac Low Risk Adult (1 of 2 - PCV13) 08/21/2019 (Originally 08/08/2002)  . HEMOGLOBIN A1C  03/05/2019  . DEXA SCAN  Completed  . INFLUENZA VACCINE  Discontinued   Fall Risk  11/12/2018 11/05/2018 10/24/2018 10/24/2018 09/10/2018  Falls in the past year? 0 0 1 1 0  Number falls in past yr: 0 0 0 0 0  Comment - - - - -  Injury with Fall? 0 0 0 0 0  Risk Factor Category  - - - - -  Risk for fall due to : - - - - -  Follow up - - - - -    Vitals:   11/12/18 1034  BP: 130/70  Pulse: 84  Resp: 18  Temp: 98.2 F (36.8 C)  TempSrc: Oral  SpO2: 93%  Height: 5\' 7"  (1.702 m)   Body mass index is 20.36 kg/m. Physical Exam Constitutional:      General: She is not in acute distress.    Appearance: She is normal weight. She is not ill-appearing.  HENT:     Nose: No congestion or rhinorrhea.     Mouth/Throat:     Mouth: Mucous membranes are moist.     Pharynx: Oropharynx is clear. No oropharyngeal exudate or posterior oropharyngeal erythema.  Eyes:     General: No scleral icterus.       Right eye: No discharge.        Left eye: No discharge.     Conjunctiva/sclera: Conjunctivae normal.     Pupils: Pupils are equal, round, and reactive to light.  Cardiovascular:     Rate and Rhythm: Normal rate and regular rhythm.     Pulses: Normal pulses.     Heart sounds: Normal heart sounds. No murmur. No friction rub. No gallop.   Pulmonary:     Effort: Pulmonary effort is normal. No respiratory distress.     Breath sounds: Normal breath sounds. No wheezing, rhonchi or rales.  Chest:     Chest wall: No tenderness.  Abdominal:      General: Abdomen is flat. Bowel sounds are normal. There is no distension.     Palpations: Abdomen is soft. There is no mass.     Tenderness: There is no abdominal tenderness. There is no right CVA tenderness, left CVA tenderness, guarding or rebound.  Musculoskeletal:        General: No swelling or tenderness.     Right lower leg: No edema.  Left lower leg: No edema.     Comments: Requires total assistance with transfer from wheelchair.  Skin:    General: Skin is warm and dry.     Coloration: Skin is not pale.     Findings: No bruising, erythema or rash.     Comments: 1. Sacral area stage 1 pressure ulcer wound bed pink in color no drainage noted.surrounding skin tissue without any signs of infections.   2. Right upper leg wound serous-sanguinous moderate amount of drainage noted on old dressing.wound bed beefy red with small top skin tissue yellow in color.Previous skin redness is resolved. No odor noted. Wound cleanse with saline,pat dry,packed with saline moist 2 X 2,covered with 4 X4 and secured with Kerlix and tape.patient tolerated procedure well.  Neurological:     Mental Status: She is alert. Mental status is at baseline.     Gait: Gait abnormal.  Psychiatric:        Mood and Affect: Mood normal.        Speech: Speech normal.        Behavior: Behavior is cooperative.        Cognition and Memory: Memory is impaired.    Labs reviewed: Recent Labs    09/03/18 1143 10/29/18 1034 11/02/18 1445  NA 139 138 138  K 4.4 4.5 4.6  CL 98 100 100  CO2 33* 32 27  GLUCOSE 187* 141* 181*  BUN 18 16 15   CREATININE 0.49 0.49* 0.36*  CALCIUM 10.4* 9.3 9.1   Recent Labs    05/15/18 1727 06/18/18 1120 09/03/18 1143 10/29/18 1034  AST 16 12* 11* 11  ALT 11 9 9 9   ALKPHOS 49 59 71  --   BILITOT 0.6 0.3 0.4 0.4  PROT 6.6 6.6 7.5 6.3  ALBUMIN 2.9* 3.7 4.1  --    Recent Labs    09/03/18 1143 10/29/18 1034 11/02/18 1445  WBC 4.3 4.4 6.5  NEUTROABS 2.4 2,358 4.4  HGB 12.3  11.4* 11.8*  HCT 40.1 35.2 38.3  MCV 93.0 90.5 95.8  PLT 219 242 212   Lab Results  Component Value Date   TSH 2.39 01/10/2017   Lab Results  Component Value Date   HGBA1C 8.2 (H) 09/03/2018   Lab Results  Component Value Date   CHOL 173 09/03/2018   HDL 52 09/03/2018   LDLCALC 103 (H) 09/03/2018   TRIG 90 09/03/2018   CHOLHDL 3.3 09/03/2018    Significant Diagnostic Results in last 30 days:  No results found.  Assessment/Plan 1. Wound of right leg, subsequent encounter Afebrile.Progressive healing much improvement noted.wound now has  serous-sanguinous moderate amount of drainage noted on old dressing.wound bed beefy red with small top skin tissue yellow in color.Previous skin redness is resolved. No odor noted. Wound cleanse with saline,pat dry,packed with saline moist 2 X 2,covered with 4 X4 and secured with Kerlix and tape.patient tolerated procedure well. - [place] nurse to teach and demonstrate to family member  how to change wound dressing.POA may change dressing once a week and as needed if dressing soiled in between dressing changes days with [place].change dressing for a total of three times per week.  - continue on protein milkshake. -  Notify [place] office if running any Temp >100.5,increased drainage from the wound,any odor or redness.  2. Pressure ulcer of sacral region, stage 1 Wound bed pink in color no drainage noted.surrounding skin tissue without any signs of infections.continue with current dressing changes.Encouraged family member to reposition frequently  from side to side and avoid long positioning on her back to promote wound healing.   Family/ staff Communication: Reviewed plan of care with patient and patient's POA.   Labs/tests ordered: None   Next appointment: 3 weeks follow up right leg wound.   Sandrea Hughs, NP

## 2018-11-14 DIAGNOSIS — I10 Essential (primary) hypertension: Secondary | ICD-10-CM | POA: Diagnosis not present

## 2018-11-14 DIAGNOSIS — F0390 Unspecified dementia without behavioral disturbance: Secondary | ICD-10-CM | POA: Diagnosis not present

## 2018-11-14 DIAGNOSIS — I890 Lymphedema, not elsewhere classified: Secondary | ICD-10-CM | POA: Diagnosis not present

## 2018-11-14 DIAGNOSIS — L89153 Pressure ulcer of sacral region, stage 3: Secondary | ICD-10-CM | POA: Diagnosis not present

## 2018-11-14 DIAGNOSIS — E1142 Type 2 diabetes mellitus with diabetic polyneuropathy: Secondary | ICD-10-CM | POA: Diagnosis not present

## 2018-11-14 DIAGNOSIS — Z7984 Long term (current) use of oral hypoglycemic drugs: Secondary | ICD-10-CM | POA: Diagnosis not present

## 2018-11-16 DIAGNOSIS — F0390 Unspecified dementia without behavioral disturbance: Secondary | ICD-10-CM | POA: Diagnosis not present

## 2018-11-16 DIAGNOSIS — L89153 Pressure ulcer of sacral region, stage 3: Secondary | ICD-10-CM | POA: Diagnosis not present

## 2018-11-16 DIAGNOSIS — I890 Lymphedema, not elsewhere classified: Secondary | ICD-10-CM | POA: Diagnosis not present

## 2018-11-16 DIAGNOSIS — Z7984 Long term (current) use of oral hypoglycemic drugs: Secondary | ICD-10-CM | POA: Diagnosis not present

## 2018-11-16 DIAGNOSIS — E1142 Type 2 diabetes mellitus with diabetic polyneuropathy: Secondary | ICD-10-CM | POA: Diagnosis not present

## 2018-11-16 DIAGNOSIS — I10 Essential (primary) hypertension: Secondary | ICD-10-CM | POA: Diagnosis not present

## 2018-11-19 DIAGNOSIS — F0390 Unspecified dementia without behavioral disturbance: Secondary | ICD-10-CM | POA: Diagnosis not present

## 2018-11-19 DIAGNOSIS — I890 Lymphedema, not elsewhere classified: Secondary | ICD-10-CM | POA: Diagnosis not present

## 2018-11-19 DIAGNOSIS — L89153 Pressure ulcer of sacral region, stage 3: Secondary | ICD-10-CM | POA: Diagnosis not present

## 2018-11-19 DIAGNOSIS — I10 Essential (primary) hypertension: Secondary | ICD-10-CM | POA: Diagnosis not present

## 2018-11-19 DIAGNOSIS — E1142 Type 2 diabetes mellitus with diabetic polyneuropathy: Secondary | ICD-10-CM | POA: Diagnosis not present

## 2018-11-19 DIAGNOSIS — Z7984 Long term (current) use of oral hypoglycemic drugs: Secondary | ICD-10-CM | POA: Diagnosis not present

## 2018-11-20 ENCOUNTER — Telehealth: Payer: Self-pay | Admitting: *Deleted

## 2018-11-20 NOTE — Telephone Encounter (Signed)
Moe with [facility] called requesting verbal orders for Skilled Nursing 2x4wks and 1x1wk Verbal order given.

## 2018-11-21 DIAGNOSIS — I10 Essential (primary) hypertension: Secondary | ICD-10-CM | POA: Diagnosis not present

## 2018-11-21 DIAGNOSIS — L89153 Pressure ulcer of sacral region, stage 3: Secondary | ICD-10-CM | POA: Diagnosis not present

## 2018-11-21 DIAGNOSIS — I890 Lymphedema, not elsewhere classified: Secondary | ICD-10-CM | POA: Diagnosis not present

## 2018-11-21 DIAGNOSIS — F0390 Unspecified dementia without behavioral disturbance: Secondary | ICD-10-CM | POA: Diagnosis not present

## 2018-11-21 DIAGNOSIS — E1142 Type 2 diabetes mellitus with diabetic polyneuropathy: Secondary | ICD-10-CM | POA: Diagnosis not present

## 2018-11-21 DIAGNOSIS — Z7984 Long term (current) use of oral hypoglycemic drugs: Secondary | ICD-10-CM | POA: Diagnosis not present

## 2018-11-23 DIAGNOSIS — E1142 Type 2 diabetes mellitus with diabetic polyneuropathy: Secondary | ICD-10-CM | POA: Diagnosis not present

## 2018-11-23 DIAGNOSIS — F0390 Unspecified dementia without behavioral disturbance: Secondary | ICD-10-CM | POA: Diagnosis not present

## 2018-11-23 DIAGNOSIS — L89153 Pressure ulcer of sacral region, stage 3: Secondary | ICD-10-CM | POA: Diagnosis not present

## 2018-11-23 DIAGNOSIS — I890 Lymphedema, not elsewhere classified: Secondary | ICD-10-CM | POA: Diagnosis not present

## 2018-11-23 DIAGNOSIS — I10 Essential (primary) hypertension: Secondary | ICD-10-CM | POA: Diagnosis not present

## 2018-11-23 DIAGNOSIS — Z7984 Long term (current) use of oral hypoglycemic drugs: Secondary | ICD-10-CM | POA: Diagnosis not present

## 2018-11-26 ENCOUNTER — Telehealth: Payer: Self-pay | Admitting: *Deleted

## 2018-11-26 DIAGNOSIS — R1312 Dysphagia, oropharyngeal phase: Secondary | ICD-10-CM | POA: Diagnosis not present

## 2018-11-26 DIAGNOSIS — Z86718 Personal history of other venous thrombosis and embolism: Secondary | ICD-10-CM | POA: Diagnosis not present

## 2018-11-26 DIAGNOSIS — I10 Essential (primary) hypertension: Secondary | ICD-10-CM | POA: Diagnosis not present

## 2018-11-26 DIAGNOSIS — L89153 Pressure ulcer of sacral region, stage 3: Secondary | ICD-10-CM | POA: Diagnosis not present

## 2018-11-26 DIAGNOSIS — Z8673 Personal history of transient ischemic attack (TIA), and cerebral infarction without residual deficits: Secondary | ICD-10-CM | POA: Diagnosis not present

## 2018-11-26 DIAGNOSIS — Z86711 Personal history of pulmonary embolism: Secondary | ICD-10-CM | POA: Diagnosis not present

## 2018-11-26 DIAGNOSIS — Z7901 Long term (current) use of anticoagulants: Secondary | ICD-10-CM | POA: Diagnosis not present

## 2018-11-26 DIAGNOSIS — E1142 Type 2 diabetes mellitus with diabetic polyneuropathy: Secondary | ICD-10-CM | POA: Diagnosis not present

## 2018-11-26 DIAGNOSIS — I890 Lymphedema, not elsewhere classified: Secondary | ICD-10-CM | POA: Diagnosis not present

## 2018-11-26 DIAGNOSIS — Z7984 Long term (current) use of oral hypoglycemic drugs: Secondary | ICD-10-CM | POA: Diagnosis not present

## 2018-11-26 DIAGNOSIS — F0390 Unspecified dementia without behavioral disturbance: Secondary | ICD-10-CM | POA: Diagnosis not present

## 2018-11-26 NOTE — Telephone Encounter (Signed)
Erica Notified.

## 2018-11-26 NOTE — Telephone Encounter (Signed)
Erica with [facility] called and stated that patient's blood sugars have been up and down with the highest being 235 and lowest 121. Today's blood sugar was 147. Stated that patient has a faint odor to Urine and wonders if they should collect U/A. No other symptoms noted. Vitals are stable. Please Advise.

## 2018-11-26 NOTE — Telephone Encounter (Signed)
Noted, I would encourage her to increase hydration at this time and monitor for temperature, abdominal discomfort, dysuria

## 2018-11-28 ENCOUNTER — Other Ambulatory Visit: Payer: Self-pay | Admitting: *Deleted

## 2018-11-28 DIAGNOSIS — L89153 Pressure ulcer of sacral region, stage 3: Secondary | ICD-10-CM | POA: Diagnosis not present

## 2018-11-28 DIAGNOSIS — Z7984 Long term (current) use of oral hypoglycemic drugs: Secondary | ICD-10-CM | POA: Diagnosis not present

## 2018-11-28 DIAGNOSIS — I10 Essential (primary) hypertension: Secondary | ICD-10-CM | POA: Diagnosis not present

## 2018-11-28 DIAGNOSIS — I890 Lymphedema, not elsewhere classified: Secondary | ICD-10-CM | POA: Diagnosis not present

## 2018-11-28 DIAGNOSIS — F0390 Unspecified dementia without behavioral disturbance: Secondary | ICD-10-CM | POA: Diagnosis not present

## 2018-11-28 DIAGNOSIS — Z86718 Personal history of other venous thrombosis and embolism: Secondary | ICD-10-CM

## 2018-11-28 DIAGNOSIS — E1142 Type 2 diabetes mellitus with diabetic polyneuropathy: Secondary | ICD-10-CM | POA: Diagnosis not present

## 2018-11-28 MED ORDER — RIVAROXABAN 15 MG PO TABS: 15.0000 mg | ORAL_TABLET | Freq: Every day | ORAL | 1 refills | Status: DC

## 2018-11-28 NOTE — Telephone Encounter (Signed)
Meds by [facility]

## 2018-11-29 ENCOUNTER — Other Ambulatory Visit: Payer: Self-pay

## 2018-11-29 ENCOUNTER — Other Ambulatory Visit: Payer: Medicare Other | Admitting: *Deleted

## 2018-11-29 DIAGNOSIS — Z515 Encounter for palliative care: Secondary | ICD-10-CM

## 2018-11-30 NOTE — Progress Notes (Signed)
COMMUNITY PALLIATIVE CARE RN NOTE  PATIENT NAME: [name] DOB: 1935/12/21 MRN: [id number]  PRIMARY CARE PROVIDER: [name], NP  RESPONSIBLE PARTY:  Acct ID - Guarantor Home Phone Work Phone Relationship Acct Type  [id number] AYLEEN, [phone]  Self P/F     [address] [place], [place] [address]   Due to the COVID-19 crisis, this virtual check-in visit was done via telephone from my office and it was initiated and consent by this patient and or family.  PLAN OF CARE and INTERVENTION:  1. ADVANCE CARE PLANNING/GOALS OF CARE: Goal is for patient to remain at home with her son/daughter in law. She is a DNR.  2. PATIENT/CAREGIVER EDUCATION: Bowel Regimen, Safe Transfers 3. DISEASE STATUS: Virtual check-in visit completed with daughter in Scientist, forensic. Patient denies pain at this time. She was seen at [facility] ED on 11/02/18 d/t an abscess in her right leg. Incision and drainage was performed and she was placed on antibiotics. Abscess is healing well, along with her sacral wounds. Her BP readings have improved. She is taking Losartan 75 mg daily and Clonidine twice daily. Her intake is good. She has been having some issues with elevated blood sugars and is now taking Metformin 1000 mg twice daily. She remains total care with all ADLs. She is incontinent of both bowel and badder. She still has intermittent issues with constipation. She is eating 1-2 Activia yogurts daily. She has also started giving enemas if patient has not had a BM in several days and they seem to work well. Will continue to monitor.  HISTORY OF PRESENT ILLNESS:  This is a 83 yo female who resides in the home with her son and daughter in law. Palliative Care team continues to follow patient. Will continue to visit patient monthly and PRN  CODE STATUS: DNR ADVANCED DIRECTIVES: Y MOST FORM: no PPS: 30%   (Duration of visit and documentation 45 minutes)   Daryl Eastern, RN BSN

## 2018-12-03 ENCOUNTER — Inpatient Hospital Stay: Payer: Medicare Other

## 2018-12-03 ENCOUNTER — Inpatient Hospital Stay: Payer: Medicare Other | Attending: Hematology & Oncology | Admitting: Hematology & Oncology

## 2018-12-03 ENCOUNTER — Telehealth: Payer: Self-pay | Admitting: *Deleted

## 2018-12-03 DIAGNOSIS — I10 Essential (primary) hypertension: Secondary | ICD-10-CM | POA: Diagnosis not present

## 2018-12-03 DIAGNOSIS — L89153 Pressure ulcer of sacral region, stage 3: Secondary | ICD-10-CM | POA: Diagnosis not present

## 2018-12-03 DIAGNOSIS — F0390 Unspecified dementia without behavioral disturbance: Secondary | ICD-10-CM | POA: Diagnosis not present

## 2018-12-03 DIAGNOSIS — E1142 Type 2 diabetes mellitus with diabetic polyneuropathy: Secondary | ICD-10-CM | POA: Diagnosis not present

## 2018-12-03 DIAGNOSIS — I890 Lymphedema, not elsewhere classified: Secondary | ICD-10-CM | POA: Diagnosis not present

## 2018-12-03 DIAGNOSIS — Z7984 Long term (current) use of oral hypoglycemic drugs: Secondary | ICD-10-CM | POA: Diagnosis not present

## 2018-12-03 NOTE — Telephone Encounter (Signed)
Myna Bright, Nurse notified and agreed.

## 2018-12-03 NOTE — Telephone Encounter (Signed)
Is she having any other symptoms, pain with urination, fever?  If she is having a strong smell most likely needs to increase fluid intake and drink more water.

## 2018-12-03 NOTE — Telephone Encounter (Signed)
Yes betadine strips are fine if she does not have a sensitivity to this.  To increase hydration to see if this does not help improve urine.

## 2018-12-03 NOTE — Telephone Encounter (Signed)
1. Spoke with Myna Bright. No fever.Patient is Incontinent and confused.   2. Wound on right leg doing a plain packing strip. Can they switch it to a Betadine packing strip due to having mild odor.    Please Advise.

## 2018-12-03 NOTE — Telephone Encounter (Signed)
Most packing strips have the betadine in them, it is concerning that there is now an odor though. I know Dinah had followed up with her wound, she may need an appt since it has an odor, is there any changes in drainage, color of discharge, redness or tenderess

## 2018-12-03 NOTE — Telephone Encounter (Signed)
No Drainage, Redness or tenderness. Nurse stated that it is a mild odor and once she cleans it there is no odor. Please Advise.

## 2018-12-03 NOTE — Telephone Encounter (Signed)
Moe with [facility] called and left message on Clinical Intake stating that patient has some foul smelling urine and requested an order to obtain a urine specimen to have tested. Please Advise.    Tried calling nurse back but had to [facility] to [facility]

## 2018-12-04 ENCOUNTER — Encounter: Payer: Self-pay | Admitting: Family

## 2018-12-04 ENCOUNTER — Ambulatory Visit (INDEPENDENT_AMBULATORY_CARE_PROVIDER_SITE_OTHER): Payer: Medicare Other | Admitting: Family

## 2018-12-04 ENCOUNTER — Telehealth: Payer: Self-pay | Admitting: *Deleted

## 2018-12-04 ENCOUNTER — Other Ambulatory Visit: Payer: Self-pay

## 2018-12-04 VITALS — BP 122/70 | HR 72 | Temp 97.5°F | Ht 67.0 in

## 2018-12-04 DIAGNOSIS — R399 Unspecified symptoms and signs involving the genitourinary system: Secondary | ICD-10-CM | POA: Diagnosis not present

## 2018-12-04 DIAGNOSIS — E1142 Type 2 diabetes mellitus with diabetic polyneuropathy: Secondary | ICD-10-CM

## 2018-12-04 DIAGNOSIS — S81801D Unspecified open wound, right lower leg, subsequent encounter: Secondary | ICD-10-CM

## 2018-12-04 MED ORDER — METFORMIN HCL 500 MG PO TABS: ORAL_TABLET | ORAL | 1 refills | Status: DC

## 2018-12-04 NOTE — Telephone Encounter (Signed)
Per Dinah: HHN to obtain In and Out Cath Urine Specimen for U/A and C/S to r/o UTI d/t symptoms of UTI.   Moe with [facility] notified and faxed order to Fax: [phone]

## 2018-12-04 NOTE — Progress Notes (Signed)
Provider: Fuquan Wilson FNP-C  [name], NP  Patient Care Team: [name], NP as PCP - General (Geriatric Medicine) [name], MD as Consulting Physician (Oncology) [name], [name], LCSW as Social Worker (Licensed Clinical Social Worker) [name], RN as Registered Nurse [facility] and Palliative Medicine)  Extended Emergency Contact Information Primary Emergency Contact: Demond,[name] Address: [address]          [place], [place] [address] Johnnette Litter of [place] Phone: [phone] Relation: Relative Secondary Emergency Contact: Cottie Banda Address: [address]          [place],  [address] Johnnette Litter of [place] Phone: [phone] Relation: Son  Code Status:  Goals of care: Advanced Directive information Advanced Directives 11/02/2018  Does Patient Have a Medical Advance Directive? Yes  Type of Paramedic of [place];Living will  Does patient want to make changes to medical advance directive? -  Copy of [place] in Chart? -     Chief Complaint  Patient presents with  . Follow-up    3 week follow up on right leg wound and daughter states patients blood sugar has been elevated and  believes patient has uti    HPI:  Pt is a 83 y.o. female seen today for an acute visit for evaluation of right leg wound,high blood sugars and symptoms of Urinary tract infections.Patient's daughter states CBG has been in the 200-300's but when she doubles patient's metformin sugars comes down to the 190's.No signs of hypo/hyperglycemia reported. Right leg wound has improved per daughter.wound continues to be managed by [facility] twice a week and POA changes in between.No drainage reported.she states has a small blisterlike area that developed recently.Nurse covered with xeroform and seems to be doing well.  Also states patient seems to be having a urinary tract infections.she has had strong urine  odor,and more urine frequency.No fever or chill reported.No changes in appetite.    Past Medical History:  Diagnosis Date  . Dementia ([place])   . Diabetes mellitus type II 03/21/2011  . DVT (deep venous thrombosis) ([place]) 03/21/2011  . Hyperlipidemia 03/21/2011  . Hypertension 03/21/2011  . Lymphedema of leg 03/21/2011  . Pulmonary embolism ([place]) 03/21/2011  . Stroke [facility])    Past Surgical History:  Procedure Laterality Date  . CHOLECYSTECTOMY  2015  . SKIN TAG REMOVAL  07/25/2016  . VAGINAL HYSTERECTOMY     unknown of date  . VASCULAR SURGERY      No Known Allergies  Outpatient Encounter Medications as of 12/04/2018  Medication Sig  . ACCU-CHEK SOFTCLIX LANCETS lancets 1 each by Other route daily. Use as instructed Dx: E11.9  . acetaminophen (TYLENOL) 500 MG tablet Take 500 mg by mouth as needed (general pain).   Marland Kitchen atorvastatin (LIPITOR) 20 MG tablet Take 1 tablet (20 mg total) by mouth daily at 6 PM.  . Cholecalciferol (VITAMIN D-3) 125 MCG (5000 UT) TABS Take 5,000 Units by mouth daily.  . cloNIDine (CATAPRES) 0.1 MG tablet Take 1 tablet (0.1 mg total) by mouth every 8 (eight) hours. May take an additional tablet if SBP >170  . diclofenac sodium (VOLTAREN) 1 % GEL Apply 1 application topically 2 (two) times daily.   Marland Kitchen gabapentin (NEURONTIN) 300 MG capsule Take 1 capsule (300 mg total) by mouth 2 (two) times daily.  Marland Kitchen glucose blood (ACCU-CHEK AVIVA PLUS) test strip 1 each by Other route 2 (two) times daily. E11.9  . losartan (COZAAR) 25 MG tablet Take  25 mg by mouth daily. Take along with 50 mg to equal 75  . losartan (COZAAR) 50 MG tablet Take 50 mg by mouth daily. Take 1 along with 25 mg to equal 75 mg total  . metFORMIN (GLUCOPHAGE) 500 MG tablet 1000 mg by mouth with breakfast and 500 mg by mouth with Dinner  . polyethylene glycol (MIRALAX / GLYCOLAX) packet Take 17 g by mouth as needed.  . Rivaroxaban (XARELTO) 15 MG TABS tablet Take 1 tablet (15 mg total) by mouth daily  with supper.  . Vitamins A & D (VITAMIN A & D) ointment Apply 1 application topically as needed for dry skin.  . Wound Dressings (MEPILEX EX) Apply to sacrum and change every 3 days or sooner as needed   No facility-administered encounter medications on file as of 12/04/2018.     Review of Systems  Unable to perform ROS: Dementia (Information provided by daughter present during visit )  Constitutional: Negative for appetite change, chills, fatigue and fever.  HENT: Negative for congestion, rhinorrhea, sinus pressure, sinus pain, sneezing and sore throat.   Respiratory: Negative for cough, chest tightness, shortness of breath and wheezing.   Cardiovascular: Negative for chest pain, palpitations and leg swelling.  Gastrointestinal: Negative for abdominal distention, abdominal pain, constipation, diarrhea, nausea and vomiting.  Endocrine: Negative for polydipsia, polyphagia and polyuria.  Genitourinary: Positive for frequency. Negative for difficulty urinating and dysuria.  Musculoskeletal: Positive for gait problem.  Skin: Positive for wound. Negative for color change, pallor and rash.       Right leg wound dressing managed by daughter and HHN much improvement reported  Neurological: Negative for dizziness, light-headedness and headaches.  Psychiatric/Behavioral: Positive for confusion. Negative for agitation and sleep disturbance. The patient is not nervous/anxious.     There is no immunization history on file for this patient. Pertinent  Health Maintenance Due  Topic Date Due  . FOOT EXAM  08/08/1945  . OPHTHALMOLOGY EXAM  01/24/2018  . PNA vac Low Risk Adult (1 of 2 - PCV13) 08/21/2019 (Originally 08/08/2002)  . HEMOGLOBIN A1C  03/05/2019  . DEXA SCAN  Completed  . INFLUENZA VACCINE  Discontinued   Fall Risk  12/04/2018 11/12/2018 11/05/2018 10/24/2018 10/24/2018  Falls in the past year? 0 0 0 1 1  Number falls in past yr: 0 0 0 0 0  Comment - - - - -  Injury with Fall? 0 0 0 0 0  Risk  Factor Category  - - - - -  Risk for fall due to : - - - - -  Follow up - - - - -    Vitals:   12/04/18 1041  BP: 122/70  Pulse: 72  Temp: (!) 97.5 F (36.4 C)  TempSrc: Oral  Height: 5\' 7"  (1.702 m)   Body mass index is 20.36 kg/m. Physical Exam Vitals signs reviewed.  Constitutional:      General: She is not in acute distress.    Appearance: She is normal weight. She is not ill-appearing.  HENT:     Head: Normocephalic.     Mouth/Throat:     Mouth: Mucous membranes are moist.     Pharynx: Oropharynx is clear. No oropharyngeal exudate or posterior oropharyngeal erythema.  Eyes:     General: No scleral icterus.       Right eye: No discharge.        Left eye: No discharge.     Conjunctiva/sclera: Conjunctivae normal.     Pupils: Pupils are  equal, round, and reactive to light.  Cardiovascular:     Rate and Rhythm: Normal rate and regular rhythm.     Pulses: Normal pulses.     Heart sounds: Normal heart sounds. No murmur. No friction rub. No gallop.   Pulmonary:     Effort: Pulmonary effort is normal. No respiratory distress.     Breath sounds: Normal breath sounds. No wheezing, rhonchi or rales.  Chest:     Chest wall: No tenderness.  Abdominal:     General: Bowel sounds are normal. There is no distension.     Palpations: Abdomen is soft. There is no mass.     Tenderness: There is no abdominal tenderness. There is no right CVA tenderness, left CVA tenderness, guarding or rebound.  Musculoskeletal:        General: No tenderness.     Right lower leg: No edema.     Left lower leg: No edema.     Comments: Wheelchair bound.   Skin:    General: Skin is warm and dry.     Coloration: Skin is not pale.     Findings: No bruising, erythema or rash.     Comments: Right upper leg wound has improved.wound bed beefy red and swallow.surrounding skin without any redness or swelling.No drainage or odor noted.small adjacent area at 31 O'clock area wound bed beefy red without any  signs of infections. Wound cleanse with saline,pat dry covered with wet-dry gauze,covered by 4X 4 and secured with Kerlix and tape.patient tolerated procedure well.   Neurological:     Mental Status: She is alert. Mental status is at baseline.     Cranial Nerves: No cranial nerve deficit.     Sensory: No sensory deficit.     Motor: No weakness.     Gait: Gait abnormal.  Psychiatric:        Mood and Affect: Mood normal.        Behavior: Behavior normal.        Thought Content: Thought content normal.        Judgment: Judgment normal.     Labs reviewed: Recent Labs    09/03/18 1143 10/29/18 1034 11/02/18 1445  NA 139 138 138  K 4.4 4.5 4.6  CL 98 100 100  CO2 33* 32 27  GLUCOSE 187* 141* 181*  BUN 18 16 15   CREATININE 0.49 0.49* 0.36*  CALCIUM 10.4* 9.3 9.1   Recent Labs    05/15/18 1727 06/18/18 1120 09/03/18 1143 10/29/18 1034  AST 16 12* 11* 11  ALT 11 9 9 9   ALKPHOS 49 59 71  --   BILITOT 0.6 0.3 0.4 0.4  PROT 6.6 6.6 7.5 6.3  ALBUMIN 2.9* 3.7 4.1  --    Recent Labs    09/03/18 1143 10/29/18 1034 11/02/18 1445  WBC 4.3 4.4 6.5  NEUTROABS 2.4 2,358 4.4  HGB 12.3 11.4* 11.8*  HCT 40.1 35.2 38.3  MCV 93.0 90.5 95.8  PLT 219 242 212   Lab Results  Component Value Date   TSH 2.39 01/10/2017   Lab Results  Component Value Date   HGBA1C 8.2 (H) 09/03/2018   Lab Results  Component Value Date   CHOL 173 09/03/2018   HDL 52 09/03/2018   LDLCALC 103 (H) 09/03/2018   TRIG 90 09/03/2018   CHOLHDL 3.3 09/03/2018    Significant Diagnostic Results in last 30 days:  No results found.  Assessment/Plan 1. Type 2 diabetes mellitus with diabetic polyneuropathy, without long-term current  use of insulin (HCC) High CBG's reported. - metFORMIN (GLUCOPHAGE) 500 MG tablet; 1000 mg by mouth twice daily with breakfast and Dinner  Dispense: 270 tablet; Refill: 1  2. Wound of right leg, subsequent encounter Right upper leg wound has improved.wound bed beefy red and  swallow.surrounding skin without any redness or swelling.No drainage or odor noted.small adjacent area at 57 O'clock area wound bed beefy red without any signs of infections.   3. Symptoms of urinary tract infection Afebrile.  - Urine Culture; Future - Urinalysis, Routine w reflex microscopic; Future. Order faxed for [facility] to in and out cath for urine specimen.   Family/ staff Communication: Reviewed plan of care with patient and daughter   Labs/tests ordered:  - Urine Culture; Future - Urinalysis, Routine w reflex microscopic; Future  Sandrea Hughs, NP

## 2018-12-05 DIAGNOSIS — N390 Urinary tract infection, site not specified: Secondary | ICD-10-CM | POA: Diagnosis not present

## 2018-12-05 DIAGNOSIS — I890 Lymphedema, not elsewhere classified: Secondary | ICD-10-CM | POA: Diagnosis not present

## 2018-12-05 DIAGNOSIS — I10 Essential (primary) hypertension: Secondary | ICD-10-CM | POA: Diagnosis not present

## 2018-12-05 DIAGNOSIS — F0390 Unspecified dementia without behavioral disturbance: Secondary | ICD-10-CM | POA: Diagnosis not present

## 2018-12-05 DIAGNOSIS — Z7984 Long term (current) use of oral hypoglycemic drugs: Secondary | ICD-10-CM | POA: Diagnosis not present

## 2018-12-05 DIAGNOSIS — E1142 Type 2 diabetes mellitus with diabetic polyneuropathy: Secondary | ICD-10-CM | POA: Diagnosis not present

## 2018-12-05 DIAGNOSIS — L89153 Pressure ulcer of sacral region, stage 3: Secondary | ICD-10-CM | POA: Diagnosis not present

## 2018-12-07 ENCOUNTER — Telehealth: Payer: Self-pay | Admitting: Family

## 2018-12-07 MED ORDER — CIPROFLOXACIN HCL 500 MG PO TABS: 500.0000 mg | ORAL_TABLET | Freq: Two times a day (BID) | ORAL | 0 refills | Status: DC

## 2018-12-07 MED ORDER — SACCHAROMYCES BOULARDII 250 MG PO CAPS: 250.0000 mg | ORAL_CAPSULE | Freq: Two times a day (BID) | ORAL | 0 refills | Status: DC

## 2018-12-07 NOTE — Telephone Encounter (Signed)
Attempted to call patient and daughter concerning urine culture results. No answer. LMOM for patient to call office. Sent medication to patient's pharmacy.

## 2018-12-07 NOTE — Telephone Encounter (Signed)
Urine analysis and culture positive for Urinary tract infection start on Cipro 500 mg tablet one by mouth twice daily x 7 days for UTI along with Florastor 250 mg capsule one by mouth twice daily x 10 days for antibiotic associated diarrhea preventions.

## 2018-12-10 ENCOUNTER — Telehealth: Payer: Self-pay | Admitting: *Deleted

## 2018-12-10 DIAGNOSIS — I10 Essential (primary) hypertension: Secondary | ICD-10-CM | POA: Diagnosis not present

## 2018-12-10 DIAGNOSIS — I890 Lymphedema, not elsewhere classified: Secondary | ICD-10-CM | POA: Diagnosis not present

## 2018-12-10 DIAGNOSIS — F0390 Unspecified dementia without behavioral disturbance: Secondary | ICD-10-CM | POA: Diagnosis not present

## 2018-12-10 DIAGNOSIS — E1142 Type 2 diabetes mellitus with diabetic polyneuropathy: Secondary | ICD-10-CM | POA: Diagnosis not present

## 2018-12-10 DIAGNOSIS — Z7984 Long term (current) use of oral hypoglycemic drugs: Secondary | ICD-10-CM | POA: Diagnosis not present

## 2018-12-10 DIAGNOSIS — L89153 Pressure ulcer of sacral region, stage 3: Secondary | ICD-10-CM | POA: Diagnosis not present

## 2018-12-10 NOTE — Telephone Encounter (Signed)
Called and spoke with [name] patient's daughter in law. Informed her of [name]'s recommendations and new medication instructions. [name] stated she was able to pick up medication over the weekend and patient started taking medication on Saturday. She was very appreciative for the phone call and [place] following up. She verbalized understanding and denied questions.

## 2018-12-10 NOTE — Telephone Encounter (Signed)
Nurse calling stating that pt does not have a deep wound anymore and would like to stop packing and change wound care orders, she's suggesting either xeroform with wrapping or calcium alginate?

## 2018-12-11 NOTE — Telephone Encounter (Signed)
Sent to [place] since she has been the one following

## 2018-12-12 ENCOUNTER — Telehealth: Payer: Self-pay | Admitting: *Deleted

## 2018-12-12 DIAGNOSIS — I890 Lymphedema, not elsewhere classified: Secondary | ICD-10-CM | POA: Diagnosis not present

## 2018-12-12 DIAGNOSIS — I10 Essential (primary) hypertension: Secondary | ICD-10-CM | POA: Diagnosis not present

## 2018-12-12 DIAGNOSIS — Z7984 Long term (current) use of oral hypoglycemic drugs: Secondary | ICD-10-CM | POA: Diagnosis not present

## 2018-12-12 DIAGNOSIS — L89153 Pressure ulcer of sacral region, stage 3: Secondary | ICD-10-CM | POA: Diagnosis not present

## 2018-12-12 DIAGNOSIS — E1142 Type 2 diabetes mellitus with diabetic polyneuropathy: Secondary | ICD-10-CM | POA: Diagnosis not present

## 2018-12-12 DIAGNOSIS — F0390 Unspecified dementia without behavioral disturbance: Secondary | ICD-10-CM | POA: Diagnosis not present

## 2018-12-12 NOTE — Telephone Encounter (Signed)
Erica with [facility] called requesting verbal orders. Stated that patient's wound is better and wonders if she can have verbal orders to apply Calcium Alginate to wound bed and cover with Telfa pad and wrap with kirlex and secure with tape.  Please Advise.

## 2018-12-12 NOTE — Telephone Encounter (Signed)
Will fwd to Dinah to see what she recommends since she has been following this wound

## 2018-12-12 NOTE — Telephone Encounter (Signed)
Home health Nurse may apply calcium Alginate to wound for drainage absorption.Notify provider if wound not improved.

## 2018-12-12 NOTE — Telephone Encounter (Signed)
Erica notified and agreed.

## 2018-12-17 DIAGNOSIS — Z029 Encounter for administrative examinations, unspecified: Secondary | ICD-10-CM

## 2018-12-17 DIAGNOSIS — I10 Essential (primary) hypertension: Secondary | ICD-10-CM | POA: Diagnosis not present

## 2018-12-17 DIAGNOSIS — I890 Lymphedema, not elsewhere classified: Secondary | ICD-10-CM | POA: Diagnosis not present

## 2018-12-17 DIAGNOSIS — L89153 Pressure ulcer of sacral region, stage 3: Secondary | ICD-10-CM | POA: Diagnosis not present

## 2018-12-17 DIAGNOSIS — E1142 Type 2 diabetes mellitus with diabetic polyneuropathy: Secondary | ICD-10-CM | POA: Diagnosis not present

## 2018-12-17 DIAGNOSIS — F0390 Unspecified dementia without behavioral disturbance: Secondary | ICD-10-CM | POA: Diagnosis not present

## 2018-12-17 DIAGNOSIS — Z7984 Long term (current) use of oral hypoglycemic drugs: Secondary | ICD-10-CM | POA: Diagnosis not present

## 2018-12-18 ENCOUNTER — Telehealth: Payer: Self-pay | Admitting: *Deleted

## 2018-12-18 NOTE — Telephone Encounter (Signed)
Received fax from [place] regarding [name].  To be completed and fax back to [facility] Fax: [phone]  Left message for [name] to return call regarding form.

## 2018-12-19 DIAGNOSIS — E1142 Type 2 diabetes mellitus with diabetic polyneuropathy: Secondary | ICD-10-CM | POA: Diagnosis not present

## 2018-12-19 DIAGNOSIS — F0390 Unspecified dementia without behavioral disturbance: Secondary | ICD-10-CM | POA: Diagnosis not present

## 2018-12-19 DIAGNOSIS — I10 Essential (primary) hypertension: Secondary | ICD-10-CM | POA: Diagnosis not present

## 2018-12-19 DIAGNOSIS — Z7984 Long term (current) use of oral hypoglycemic drugs: Secondary | ICD-10-CM | POA: Diagnosis not present

## 2018-12-19 DIAGNOSIS — L89153 Pressure ulcer of sacral region, stage 3: Secondary | ICD-10-CM | POA: Diagnosis not present

## 2018-12-19 DIAGNOSIS — I890 Lymphedema, not elsewhere classified: Secondary | ICD-10-CM | POA: Diagnosis not present

## 2018-12-21 NOTE — Telephone Encounter (Signed)
[name] will fill out forms and then give them to me to fax once completed.

## 2018-12-24 ENCOUNTER — Telehealth: Payer: Self-pay | Admitting: *Deleted

## 2018-12-24 DIAGNOSIS — I890 Lymphedema, not elsewhere classified: Secondary | ICD-10-CM | POA: Diagnosis not present

## 2018-12-24 DIAGNOSIS — E1142 Type 2 diabetes mellitus with diabetic polyneuropathy: Secondary | ICD-10-CM | POA: Diagnosis not present

## 2018-12-24 DIAGNOSIS — I10 Essential (primary) hypertension: Secondary | ICD-10-CM | POA: Diagnosis not present

## 2018-12-24 DIAGNOSIS — F0390 Unspecified dementia without behavioral disturbance: Secondary | ICD-10-CM | POA: Diagnosis not present

## 2018-12-24 DIAGNOSIS — Z7984 Long term (current) use of oral hypoglycemic drugs: Secondary | ICD-10-CM | POA: Diagnosis not present

## 2018-12-24 DIAGNOSIS — L89153 Pressure ulcer of sacral region, stage 3: Secondary | ICD-10-CM | POA: Diagnosis not present

## 2018-12-24 NOTE — Telephone Encounter (Signed)
Moe with [facility] called requesting verbal orders for Skilled Nursing for Wound Care 1x4wks. Verbal orders given.

## 2018-12-25 ENCOUNTER — Telehealth: Payer: Self-pay | Admitting: Licensed Clinical Social Worker

## 2018-12-25 DIAGNOSIS — M25562 Pain in left knee: Secondary | ICD-10-CM | POA: Diagnosis not present

## 2018-12-25 DIAGNOSIS — M25462 Effusion, left knee: Secondary | ICD-10-CM | POA: Diagnosis not present

## 2018-12-25 NOTE — Telephone Encounter (Signed)
Palliative Care SW left a vm with patient's daughter-in-law, [name], to schedule a visit.

## 2018-12-26 DIAGNOSIS — L89153 Pressure ulcer of sacral region, stage 3: Secondary | ICD-10-CM | POA: Diagnosis not present

## 2018-12-26 DIAGNOSIS — E1142 Type 2 diabetes mellitus with diabetic polyneuropathy: Secondary | ICD-10-CM | POA: Diagnosis not present

## 2018-12-26 DIAGNOSIS — Z7901 Long term (current) use of anticoagulants: Secondary | ICD-10-CM | POA: Diagnosis not present

## 2018-12-26 DIAGNOSIS — F0390 Unspecified dementia without behavioral disturbance: Secondary | ICD-10-CM | POA: Diagnosis not present

## 2018-12-26 DIAGNOSIS — I890 Lymphedema, not elsewhere classified: Secondary | ICD-10-CM

## 2018-12-26 DIAGNOSIS — I10 Essential (primary) hypertension: Secondary | ICD-10-CM

## 2018-12-26 DIAGNOSIS — Z8673 Personal history of transient ischemic attack (TIA), and cerebral infarction without residual deficits: Secondary | ICD-10-CM | POA: Diagnosis not present

## 2018-12-26 DIAGNOSIS — Z7984 Long term (current) use of oral hypoglycemic drugs: Secondary | ICD-10-CM | POA: Diagnosis not present

## 2018-12-26 DIAGNOSIS — R1312 Dysphagia, oropharyngeal phase: Secondary | ICD-10-CM | POA: Diagnosis not present

## 2018-12-26 DIAGNOSIS — Z86711 Personal history of pulmonary embolism: Secondary | ICD-10-CM

## 2018-12-26 DIAGNOSIS — Z86718 Personal history of other venous thrombosis and embolism: Secondary | ICD-10-CM

## 2018-12-31 ENCOUNTER — Other Ambulatory Visit: Payer: Self-pay | Admitting: *Deleted

## 2018-12-31 DIAGNOSIS — I10 Essential (primary) hypertension: Secondary | ICD-10-CM

## 2018-12-31 MED ORDER — CLONIDINE HCL 0.1 MG PO TABS: 0.1000 mg | ORAL_TABLET | Freq: Three times a day (TID) | ORAL | 0 refills | Status: DC

## 2018-12-31 NOTE — Telephone Encounter (Signed)
Caregiver requested Rx due to Waiting on mail order to come

## 2019-01-02 ENCOUNTER — Other Ambulatory Visit: Payer: Medicare Other | Admitting: *Deleted

## 2019-01-02 ENCOUNTER — Other Ambulatory Visit: Payer: Self-pay

## 2019-01-02 DIAGNOSIS — Z515 Encounter for palliative care: Secondary | ICD-10-CM | POA: Diagnosis not present

## 2019-01-03 ENCOUNTER — Telehealth: Payer: Self-pay | Admitting: *Deleted

## 2019-01-03 ENCOUNTER — Other Ambulatory Visit: Payer: Self-pay

## 2019-01-03 ENCOUNTER — Ambulatory Visit (INDEPENDENT_AMBULATORY_CARE_PROVIDER_SITE_OTHER): Payer: Medicare Other | Admitting: Family

## 2019-01-03 ENCOUNTER — Encounter: Payer: Self-pay | Admitting: Family

## 2019-01-03 VITALS — BP 148/78

## 2019-01-03 DIAGNOSIS — R05 Cough: Secondary | ICD-10-CM

## 2019-01-03 DIAGNOSIS — I890 Lymphedema, not elsewhere classified: Secondary | ICD-10-CM | POA: Diagnosis not present

## 2019-01-03 DIAGNOSIS — Z7984 Long term (current) use of oral hypoglycemic drugs: Secondary | ICD-10-CM | POA: Diagnosis not present

## 2019-01-03 DIAGNOSIS — L89153 Pressure ulcer of sacral region, stage 3: Secondary | ICD-10-CM | POA: Diagnosis not present

## 2019-01-03 DIAGNOSIS — E1142 Type 2 diabetes mellitus with diabetic polyneuropathy: Secondary | ICD-10-CM | POA: Diagnosis not present

## 2019-01-03 DIAGNOSIS — R059 Cough, unspecified: Secondary | ICD-10-CM

## 2019-01-03 DIAGNOSIS — F0390 Unspecified dementia without behavioral disturbance: Secondary | ICD-10-CM | POA: Diagnosis not present

## 2019-01-03 DIAGNOSIS — I10 Essential (primary) hypertension: Secondary | ICD-10-CM | POA: Diagnosis not present

## 2019-01-03 NOTE — Progress Notes (Addendum)
This service is provided via telemedicine  No vital signs collected/recorded due to the encounter was a telemedicine visit.   Location of patient (ex: home, work):  Home   Patient consents to a telephone visit:  Yes  Location of the provider (ex: office, home): office   Name of any referring provider:  [name] NP   Names of all persons participating in the telemedicine service and their role in the encounter:  Yoshito Gaza NP, Ruthell Rummage CMA, Thomasenia Bottoms and daughter   Time spent on call:  Ruthell Rummage, [place] spent  10 Minutes on phone with patient     [facility] clinic  Provider: Marlowe Sax, NP   Code Status: FULL Goals of Care:  Advanced Directives 11/02/2018  Does Patient Have a Medical Advance Directive? Yes  Type of Paramedic of [place];Living will  Does patient want to make changes to medical advance directive? -  Copy of [place] in Chart? -     Chief Complaint  Patient presents with  . Acute Visit    Home health nurse reports patient is having increased crackles in upper lobes      HPI: Patient is a 83 y.o. female seen today for an acute visit for evaluation of cough.Patient's daughter provided HPI information due to patient's mental status unable to provide information.Home health Nurse reports patient has had increased crackles in upper lobes.daughter states symptoms was worst yesterday but seems to have improved today.she states patient was very lethargic on Sunday 12/30/2018,with poor appetite and drooping of saliva on her pillow.then she started coughing on Monday.No fever or chills reported.also states patient not having any shortness of breath or wheezing.   Past Medical History:  Diagnosis Date  . Dementia ([place])   . Diabetes mellitus type II 03/21/2011  . DVT (deep venous thrombosis) ([place]) 03/21/2011  . Hyperlipidemia 03/21/2011  . Hypertension 03/21/2011  . Lymphedema of leg 03/21/2011  . Pulmonary  embolism ([place]) 03/21/2011  . Stroke [facility])     Past Surgical History:  Procedure Laterality Date  . CHOLECYSTECTOMY  2015  . SKIN TAG REMOVAL  07/25/2016  . VAGINAL HYSTERECTOMY     unknown of date  . VASCULAR SURGERY      No Known Allergies  Outpatient Encounter Medications as of 01/03/2019  Medication Sig  . ACCU-CHEK SOFTCLIX LANCETS lancets 1 each by Other route daily. Use as instructed Dx: E11.9  . acetaminophen (TYLENOL) 500 MG tablet Take 500 mg by mouth as needed (general pain).   Marland Kitchen atorvastatin (LIPITOR) 20 MG tablet Take 1 tablet (20 mg total) by mouth daily at 6 PM.  . Cholecalciferol (VITAMIN D-3) 125 MCG (5000 UT) TABS Take 5,000 Units by mouth daily.  . ciprofloxacin (CIPRO) 500 MG tablet Take 1 tablet (500 mg total) by mouth 2 (two) times daily. Take for 7 days  . cloNIDine (CATAPRES) 0.1 MG tablet Take 1 tablet (0.1 mg total) by mouth every 8 (eight) hours. May take an additional tablet if SBP >170  . diclofenac sodium (VOLTAREN) 1 % GEL Apply 1 application topically 2 (two) times daily.   Marland Kitchen gabapentin (NEURONTIN) 300 MG capsule Take 1 capsule (300 mg total) by mouth 2 (two) times daily.  Marland Kitchen glucose blood (ACCU-CHEK AVIVA PLUS) test strip 1 each by Other route 2 (two) times daily. E11.9  . losartan (COZAAR) 25 MG tablet Take 25 mg by mouth daily. Take along with 50 mg to equal 75  . losartan (COZAAR) 50  MG tablet Take 50 mg by mouth daily. Take 1 along with 25 mg to equal 75 mg total  . metFORMIN (GLUCOPHAGE) 500 MG tablet 1000 mg by mouth twice daily with breakfast and Dinner  . polyethylene glycol (MIRALAX / GLYCOLAX) packet Take 17 g by mouth as needed.  . Rivaroxaban (XARELTO) 15 MG TABS tablet Take 1 tablet (15 mg total) by mouth daily with supper.  . saccharomyces boulardii (FLORASTOR) 250 MG capsule Take 1 capsule (250 mg total) by mouth 2 (two) times daily. Take for 10 days  . Vitamins A & D (VITAMIN A & D) ointment Apply 1 application topically as needed for dry  skin.  . Wound Dressings (MEPILEX EX) Apply to sacrum and change every 3 days or sooner as needed   No facility-administered encounter medications on file as of 01/03/2019.     Review of Systems:  Review of Systems  Constitutional: Negative for appetite change, chills, fatigue and fever.  HENT: Negative for congestion, rhinorrhea, sinus pressure, sinus pain, sneezing and sore throat.   Respiratory: Positive for cough. Negative for chest tightness, shortness of breath and wheezing.        Rattling noted by Nurse.  Cardiovascular: Negative for chest pain, palpitations and leg swelling.  Gastrointestinal: Negative for abdominal distention, abdominal pain, constipation, diarrhea, nausea and vomiting.  Genitourinary: Negative for difficulty urinating, dysuria and urgency.  Musculoskeletal: Positive for gait problem.  Skin: Negative for color change, pallor and rash.  Neurological: Negative for dizziness, light-headedness and headaches.  Psychiatric/Behavioral: Positive for confusion.    Health Maintenance  Topic Date Due  . FOOT EXAM  08/08/1945  . OPHTHALMOLOGY EXAM  01/24/2018  . TETANUS/TDAP  05/10/2019 (Originally 08/07/1954)  . PNA vac Low Risk Adult (1 of 2 - PCV13) 08/21/2019 (Originally 08/08/2002)  . HEMOGLOBIN A1C  03/05/2019  . DEXA SCAN  Completed  . INFLUENZA VACCINE  Discontinued    Physical Exam: There were no vitals filed for this visit. There is no height or weight on file to calculate BMI. Physical Exam  unable to complete on telephone visit.  Labs reviewed: Basic Metabolic Panel: Recent Labs    09/03/18 1143 10/29/18 1034 11/02/18 1445  NA 139 138 138  K 4.4 4.5 4.6  CL 98 100 100  CO2 33* 32 27  GLUCOSE 187* 141* 181*  BUN 18 16 15   CREATININE 0.49 0.49* 0.36*  CALCIUM 10.4* 9.3 9.1   Liver Function Tests: Recent Labs    05/15/18 1727 06/18/18 1120 09/03/18 1143 10/29/18 1034  AST 16 12* 11* 11  ALT 11 9 9 9   ALKPHOS 49 59 71  --   BILITOT 0.6  0.3 0.4 0.4  PROT 6.6 6.6 7.5 6.3  ALBUMIN 2.9* 3.7 4.1  --    No results for input(s): LIPASE, AMYLASE in the last 8760 hours. No results for input(s): AMMONIA in the last 8760 hours. CBC: Recent Labs    09/03/18 1143 10/29/18 1034 11/02/18 1445  WBC 4.3 4.4 6.5  NEUTROABS 2.4 2,358 4.4  HGB 12.3 11.4* 11.8*  HCT 40.1 35.2 38.3  MCV 93.0 90.5 95.8  PLT 219 242 212   Lipid Panel: Recent Labs    09/03/18 1143  CHOL 173  HDL 52  LDLCALC 103*  TRIG 90  CHOLHDL 3.3   Lab Results  Component Value Date   HGBA1C 8.2 (H) 09/03/2018    Procedures since last visit:  Assessment/Plan Cough in adult Afebrile.Non-productive cough.bilateral upper lobes crackles reported by [facility].Patient  high risk for aspiration due to dementia. Will obtain a portable chest X-ray 2 views to rule out aspiration Pneumonia. Portable due to dementia and high risk for falls.  Labs/tests ordered: portable chest X-ray 2 views.Order written and faxed by CMA Ruthell Rummage  to Home health Nurse [facility] Next appt:  02/25/2019 with [name] NP   Spent 12 minutes of non-face to face with patient

## 2019-01-03 NOTE — Telephone Encounter (Signed)
Erica with [facility] called and left message on Clinical intake and stated that patient has crackles noted to the upper lobes and lungs. Increased Saliva.Marland Kitchen   Called caregiver and scheduled a TeleVisit with Dinah.

## 2019-01-03 NOTE — Progress Notes (Signed)
COMMUNITY PALLIATIVE CARE RN NOTE  PATIENT NAME: [name] DOB: [birth year] MRN: [id number]  PRIMARY CARE PROVIDER: [name], NP  RESPONSIBLE PARTY:  Acct ID - Guarantor Home Phone Work Phone Relationship Acct Type  [id number] NGA, [phone]  Self P/F     [address] [place], [place] [address]   Due to the COVID-19 crisis, this virtual check-in visit was done via telephone from my office and it was initiated and consent by this patient and or family.  PLAN OF CARE and INTERVENTION:  1. ADVANCE CARE PLANNING/GOALS OF CARE: Goal is for patient to remain in the home with her son and daughter-in-law and avoid hospitalizations. 2. PATIENT/CAREGIVER EDUCATION: Medication Management, Respiratory issues 3. DISEASE STATUS: Virtual check-in visit completed via telephone. Patient's daughter-in-law, [name], is able to provide health history. She reports that on Sunday, 12/30/18, patient had an episode at the dining table where she had blank, distant stare, head slumped downward w/some drooling noted. She seemed less responsive. [name] feels patient may have had a TIA. Patient is now more responsive, however she is noticing more congestion.. She is coughing more, especially during the night and seems to produce a large amount of saliva. Sounds as if patient may be aspirating her own secretions. Today, her nose is runny. She denies any fever or chills. No coughing noted during meals on solids or liquids. HHRN from [facility] is scheduled to visit with patient tomorrow to assess her skin and respiratory status. She was experiencing increased left knee swelling and pain. She had some fluid removed last week from her knee and was given a steroid injection. Pain is much improved. She continues on Losartan and Clonidine to regulate her BP. [name] says her blood sugars have been around 180s-190s. She is giving Metformin 500-1000mg  BID according to CBG readings. The wound on her right leg has  closed and is no longer draining. She has some breakdown on her bottom, that heals and reopens. She continues with a hired caregiver during the day. Will continue to monitor.  HISTORY OF PRESENT ILLNESS:  This is a 83 yo female who resides in the home with her son and daughter-in-law. Palliative care team continues to follow patient. Will continue to visit patient monthly and PRN.  CODE STATUS: DNR ADVANCED DIRECTIVES: Y MOST FORM: no PPS: 30%   (Duration of visit and documentation 60 minutes)   Daryl Eastern, RN BSN

## 2019-01-05 DIAGNOSIS — R05 Cough: Secondary | ICD-10-CM | POA: Diagnosis not present

## 2019-01-08 ENCOUNTER — Telehealth: Payer: Self-pay

## 2019-01-08 NOTE — Telephone Encounter (Signed)
Impression: 1.)Normal chest xray 2.) No tuberculosis is noted  Per [name], NP call patient and inform her of results  Spoke with [name] (relative), [name] verbalized understanding of results

## 2019-01-11 ENCOUNTER — Encounter (HOSPITAL_COMMUNITY): Payer: Self-pay | Admitting: Emergency Medicine

## 2019-01-11 ENCOUNTER — Emergency Department (HOSPITAL_COMMUNITY)
Admission: EM | Admit: 2019-01-11 | Discharge: 2019-01-12 | Disposition: A | Discharge status: Home / Self Care | Payer: Medicare Other | Attending: Emergency Medicine | Admitting: Emergency Medicine

## 2019-01-11 ENCOUNTER — Other Ambulatory Visit: Payer: Self-pay

## 2019-01-11 ENCOUNTER — Telehealth: Payer: Self-pay

## 2019-01-11 DIAGNOSIS — E1142 Type 2 diabetes mellitus with diabetic polyneuropathy: Secondary | ICD-10-CM | POA: Insufficient documentation

## 2019-01-11 DIAGNOSIS — E1169 Type 2 diabetes mellitus with other specified complication: Secondary | ICD-10-CM | POA: Diagnosis not present

## 2019-01-11 DIAGNOSIS — E785 Hyperlipidemia, unspecified: Secondary | ICD-10-CM | POA: Diagnosis not present

## 2019-01-11 DIAGNOSIS — I890 Lymphedema, not elsewhere classified: Secondary | ICD-10-CM | POA: Diagnosis not present

## 2019-01-11 DIAGNOSIS — Z79899 Other long term (current) drug therapy: Secondary | ICD-10-CM | POA: Insufficient documentation

## 2019-01-11 DIAGNOSIS — I10 Essential (primary) hypertension: Secondary | ICD-10-CM | POA: Diagnosis not present

## 2019-01-11 DIAGNOSIS — F0390 Unspecified dementia without behavioral disturbance: Secondary | ICD-10-CM | POA: Insufficient documentation

## 2019-01-11 DIAGNOSIS — W19XXXA Unspecified fall, initial encounter: Secondary | ICD-10-CM

## 2019-01-11 DIAGNOSIS — Z7984 Long term (current) use of oral hypoglycemic drugs: Secondary | ICD-10-CM | POA: Insufficient documentation

## 2019-01-11 DIAGNOSIS — L89153 Pressure ulcer of sacral region, stage 3: Secondary | ICD-10-CM | POA: Diagnosis not present

## 2019-01-11 DIAGNOSIS — Z7901 Long term (current) use of anticoagulants: Secondary | ICD-10-CM | POA: Diagnosis not present

## 2019-01-11 DIAGNOSIS — S0990XA Unspecified injury of head, initial encounter: Secondary | ICD-10-CM | POA: Diagnosis not present

## 2019-01-11 DIAGNOSIS — M25462 Effusion, left knee: Secondary | ICD-10-CM | POA: Diagnosis not present

## 2019-01-11 DIAGNOSIS — S3993XA Unspecified injury of pelvis, initial encounter: Secondary | ICD-10-CM | POA: Diagnosis not present

## 2019-01-11 DIAGNOSIS — I160 Hypertensive urgency: Secondary | ICD-10-CM | POA: Diagnosis not present

## 2019-01-11 LAB — CBC WITH DIFFERENTIAL/PLATELET
Abs Immature Granulocytes: 0.02 10*3/uL (ref 0.00–0.07)
Basophils Absolute: 0 10*3/uL (ref 0.0–0.1)
Basophils Relative: 0 %
Eosinophils Absolute: 0 10*3/uL (ref 0.0–0.5)
Eosinophils Relative: 0 %
HCT: 40.1 % (ref 36.0–46.0)
Hemoglobin: 12.3 g/dL (ref 12.0–15.0)
Immature Granulocytes: 0 %
Lymphocytes Relative: 29 %
Lymphs Abs: 1.9 10*3/uL (ref 0.7–4.0)
MCH: 29.1 pg (ref 26.0–34.0)
MCHC: 30.7 g/dL (ref 30.0–36.0)
MCV: 95 fL (ref 80.0–100.0)
Monocytes Absolute: 0.6 10*3/uL (ref 0.1–1.0)
Monocytes Relative: 9 %
Neutro Abs: 4 10*3/uL (ref 1.7–7.7)
Neutrophils Relative %: 62 %
Platelets: 283 10*3/uL (ref 150–400)
RBC: 4.22 MIL/uL (ref 3.87–5.11)
RDW: 12.8 % (ref 11.5–15.5)
WBC: 6.5 10*3/uL (ref 4.0–10.5)
nRBC: 0 % (ref 0.0–0.2)

## 2019-01-11 LAB — COMPREHENSIVE METABOLIC PANEL
ALT: 14 U/L (ref 0–44)
AST: 16 U/L (ref 15–41)
Albumin: 3.4 g/dL — ABNORMAL LOW (ref 3.5–5.0)
Alkaline Phosphatase: 76 U/L (ref 38–126)
Anion gap: 13 (ref 5–15)
BUN: 23 mg/dL (ref 8–23)
CO2: 25 mmol/L (ref 22–32)
Calcium: 9.4 mg/dL (ref 8.9–10.3)
Chloride: 101 mmol/L (ref 98–111)
Creatinine, Ser: 0.49 mg/dL (ref 0.44–1.00)
GFR calc Af Amer: >60 mL/min (ref >60)
GFR calc non Af Amer: >60 mL/min (ref >60)
Glucose, Bld: 198 mg/dL — ABNORMAL HIGH (ref 70–99)
Potassium: 3.9 mmol/L (ref 3.5–5.1)
Sodium: 139 mmol/L (ref 135–145)
Total Bilirubin: 0.6 mg/dL (ref 0.3–1.2)
Total Protein: 7.5 g/dL (ref 6.5–8.1)

## 2019-01-11 NOTE — Telephone Encounter (Signed)
Is she in any pain? Does she have full ROM? May need to have more of an evaluation- Had the blood pressure medication been given? Did they recheck on the other side? If remains elevated would recommend going to ED

## 2019-01-11 NOTE — Telephone Encounter (Signed)
Home health nurse Mo, called to report patient had elevated bp of 200/100 in left arm taken manual. She also reported patient had a recent fall and has residual swelling on left upper extremity. No other information to report. Will forward to patient's provider.

## 2019-01-11 NOTE — ED Triage Notes (Signed)
Family reported elevated blood pressure this afternoon 200/100, with generalized weakness and fell last Sunday from her bedside commode . Pt. Took additional dose of her antihypertensive med per MD advise . Patient is demented .

## 2019-01-11 NOTE — ED Notes (Signed)
Pt daughter states that she has not had a BM in a week and fell on Sunday

## 2019-01-12 ENCOUNTER — Emergency Department (HOSPITAL_COMMUNITY): Payer: Medicare Other

## 2019-01-12 DIAGNOSIS — S3993XA Unspecified injury of pelvis, initial encounter: Secondary | ICD-10-CM | POA: Diagnosis not present

## 2019-01-12 DIAGNOSIS — I10 Essential (primary) hypertension: Secondary | ICD-10-CM | POA: Diagnosis not present

## 2019-01-12 DIAGNOSIS — M25462 Effusion, left knee: Secondary | ICD-10-CM | POA: Diagnosis not present

## 2019-01-12 DIAGNOSIS — S0990XA Unspecified injury of head, initial encounter: Secondary | ICD-10-CM | POA: Diagnosis not present

## 2019-01-12 LAB — URINALYSIS, ROUTINE W REFLEX MICROSCOPIC
Bacteria, UA: NONE SEEN
Bilirubin Urine: NEGATIVE
Glucose, UA: 50 mg/dL — AB
Hgb urine dipstick: NEGATIVE
Ketones, ur: NEGATIVE mg/dL
Leukocytes,Ua: NEGATIVE
Nitrite: NEGATIVE
Protein, ur: 100 mg/dL — AB
Specific Gravity, Urine: 1.021 (ref 1.005–1.030)
pH: 5 (ref 5.0–8.0)

## 2019-01-12 MED ORDER — CLONIDINE HCL 0.2 MG PO TABS
0.2000 mg | ORAL_TABLET | Freq: Once | ORAL | Status: AC
  Administered 2019-01-12: 0.2 mg via ORAL
  Filled 2019-01-12: qty 1

## 2019-01-12 MED ORDER — ACETAMINOPHEN 325 MG PO TABS
650.0000 mg | ORAL_TABLET | Freq: Once | ORAL | Status: AC
  Administered 2019-01-12: 02:00:00 650 mg via ORAL
  Filled 2019-01-12: qty 2

## 2019-01-12 MED ORDER — CLONIDINE HCL 0.1 MG PO TABS: 0.1000 mg | ORAL_TABLET | Freq: Once | ORAL | Status: DC

## 2019-01-12 NOTE — ED Provider Notes (Addendum)
[facility] EMERGENCY DEPARTMENT Provider Note   CSN: [id number] Arrival date & time: 01/11/19  1755     History   Chief Complaint Chief Complaint  Patient presents with  . Hypertension    HPI [name] is a 83 y.o. female.     HPI  83 year old female with history of diabetes, dementia, DVT, PE comes in a chief complaint of elevated blood pressure.  Patient is on anticoagulation.  According to patient's daughter, patient was assessed by home health and noted to have elevated blood pressure over [id number] systolic.  They advised that patient come to the ER for further assessment.  2 weeks ago patient had an episode where she started drooping and became unresponsive for a short duration.  Thereafter, the daughter is noted that patient is not as energetic as she had been and her oral intake has gone down.  1 week ago patient fell from her bedside commode onto the left side.  Otherwise, there has been no new events and patient has been getting her BP medications as prescribed.  Patient was given an extra dose of clonidine which did not help significantly.  Past Medical History:  Diagnosis Date  . Dementia ([place])   . Diabetes mellitus type II 03/21/2011  . DVT (deep venous thrombosis) ([place]) 03/21/2011  . Hyperlipidemia 03/21/2011  . Hypertension 03/21/2011  . Lymphedema of leg 03/21/2011  . Pulmonary embolism ([place]) 03/21/2011  . Stroke [facility])     Patient Active Problem List   Diagnosis Date Noted  . Skin ulcer of sacrum with fat layer exposed ([place]) 07/22/2017  . Lipoma of left lower extremity 07/22/2017  . Estrogen deficiency 07/22/2017  . Hyperlipidemia associated with type 2 diabetes mellitus ([place]) 07/22/2017  . History of TIA (transient ischemic attack) 01/10/2017  . Atrial septal aneurysm 11/22/2016  . Bilateral lower extremity edema 11/22/2016  . Iron deficiency anemia due to chronic blood loss 11/16/2016  . TIA (transient ischemic attack) 10/05/2016  .  Lower urinary tract infectious disease   . Current use of long term anticoagulation 08/24/2016  . Metabolic encephalopathy 99991111  . Senile dementia without behavioral disturbance ([place]) 08/23/2016  . Acute CVA (cerebrovascular accident) ([place])   . Left-sided weakness 05/11/2016  . Dysarthria 05/11/2016  . Acute left-sided weakness 05/11/2016  . History of pulmonary embolism 03/21/2011  . History of DVT (deep vein thrombosis) 03/21/2011  . Type 2 diabetes mellitus with diabetic polyneuropathy, without long-term current use of insulin ([place]) 03/21/2011  . Lymphedema of leg 03/21/2011  . Hyperlipidemia 03/21/2011  . Hypertension 03/21/2011    Past Surgical History:  Procedure Laterality Date  . CHOLECYSTECTOMY  2015  . SKIN TAG REMOVAL  07/25/2016  . VAGINAL HYSTERECTOMY     unknown of date  . VASCULAR SURGERY       OB History    Gravida  3   Para  3   Term  3   Preterm      AB      Living  3     SAB      TAB      Ectopic      Multiple      Live Births  3            Home Medications    Prior to Admission medications   Medication Sig Start Date End Date Taking? Authorizing Provider  acetaminophen (TYLENOL) 500 MG tablet Take 500 mg by mouth as needed (general pain).    Yes  [provider]  atorvastatin (LIPITOR) 20 MG tablet Take 1 tablet (20 mg total) by mouth daily at 6 PM. 06/19/18  Yes Eubanks, Carlos American, NP  Cholecalciferol (VITAMIN D-3) 125 MCG (5000 UT) TABS Take 5,000 Units by mouth daily.   Yes [provider]  cloNIDine (CATAPRES) 0.1 MG tablet Take 1 tablet (0.1 mg total) by mouth every 8 (eight) hours. May take an additional tablet if SBP >170 Patient taking differently: Take 0.1 mg by mouth 2 (two) times daily. May take an additional tablet if SBP >170  12/31/18  Yes [name], NP  diclofenac sodium (VOLTAREN) 1 % GEL Apply 1 application topically 2 (two) times daily.    Yes [provider]  gabapentin  (NEURONTIN) 300 MG capsule Take 1 capsule (300 mg total) by mouth 2 (two) times daily. 06/19/18  Yes [name], NP  losartan (COZAAR) 25 MG tablet Take 25 mg by mouth daily. Take along with 50 mg to equal 75   Yes [provider]  losartan (COZAAR) 50 MG tablet Take 50 mg by mouth daily. Take 1 along with 25 mg to equal 75 mg total   Yes [provider]  metFORMIN (GLUCOPHAGE) 500 MG tablet 1000 mg by mouth twice daily with breakfast and Dinner Patient taking differently: Take 500-1,000 mg by mouth See admin instructions. Take 2 tablets every morning and take 1 tablet every evening 12/04/18  Yes Ngetich, Dinah C, NP  polyethylene glycol (MIRALAX / GLYCOLAX) packet Take 17 g by mouth daily as needed for mild constipation.    Yes [provider]  Rivaroxaban (XARELTO) 15 MG TABS tablet Take 1 tablet (15 mg total) by mouth daily with supper. 11/28/18  Yes [name], NP  Vitamins A & D (VITAMIN A & D) ointment Apply 1 application topically as needed for dry skin.   Yes [provider]  ACCU-CHEK SOFTCLIX LANCETS lancets 1 each by Other route daily. Use as instructed Dx: E11.9 10/18/17   Gildardo Cranker, DO  glucose blood (ACCU-CHEK AVIVA PLUS) test strip 1 each by Other route 2 (two) times daily. E11.9 10/18/17   Gildardo Cranker, DO  Wound Dressings ([place]) Apply to sacrum and change every day  as needed    [provider]    Family History Family History  Problem Relation Age of Onset  . Heart attack Mother   . COPD Sister   . Stroke Sister   . Bipolar disorder Daughter     Social History Social History   Tobacco Use  . Smoking status: Never Smoker  . Smokeless tobacco: Never Used  Substance Use Topics  . Alcohol use: No  . Drug use: No     Allergies   Patient has no known allergies.   Review of Systems Review of Systems  Unable to perform ROS: Dementia     Physical Exam Updated Vital Signs BP 130/67   Pulse 63    Temp 98.9 F (37.2 C) (Axillary)   Resp 13   SpO2 99%   Physical Exam Vitals signs and nursing note reviewed.  Constitutional:      Appearance: She is well-developed.  HENT:     Head: Atraumatic.  Neck:     Musculoskeletal: Normal range of motion and neck supple.  Cardiovascular:     Rate and Rhythm: Normal rate.  Pulmonary:     Effort: Pulmonary effort is normal.  Abdominal:     General: Bowel sounds are normal.  Musculoskeletal:     Right lower leg: Edema present.     Left lower leg: Edema present.     Comments: Left knee and proximal femur is slightly tender to palpation, but there is no gross deformities otherwise.  Skin:    General: Skin is warm and dry.  Neurological:     Mental Status: She is alert. She is disoriented.      ED Treatments / Results  Labs (all labs ordered are listed, but only abnormal results are displayed) Labs Reviewed  COMPREHENSIVE METABOLIC PANEL - Abnormal; Notable for the following components:      Result Value   Glucose, Bld 198 (*)    Albumin 3.4 (*)    All other components within normal limits  URINALYSIS, ROUTINE W REFLEX MICROSCOPIC - Abnormal; Notable for the following components:   Glucose, UA 50 (*)    Protein, ur 100 (*)    All other components within normal limits  CBC WITH DIFFERENTIAL/PLATELET    EKG None  Radiology Dg Pelvis 1-2 Views  Result Date: 01/12/2019 CLINICAL DATA:  Fall and dementia EXAM: PELVIS - 1-2 VIEW COMPARISON:  August 23, 2016 FINDINGS: There is diffuse osteopenia which limits the evaluation. No definite acute fracture or malalignment. There is dense vascular calcifications. IMPRESSION: Diffuse osteopenia which limits evaluation. No acute osseous abnormality. If there is high clinical suspicion for occult hip fracture or the patient refuses to weightbear, consider further evaluation with MRI. Although CT is expeditious, evidence is lacking regarding accuracy of CT over plain film radiography.  Electronically Signed   By: Prudencio Pair M.D.   On: 01/12/2019 02:28   Dg Knee 2 Views Left  Result Date: 01/12/2019 CLINICAL DATA:  Fall dementia EXAM: LEFT KNEE - 1-2 VIEW COMPARISON:  None. FINDINGS: There is diffuse demineralization of the osseous structure which limits the evaluation. There is a moderate knee joint effusion with prepatellar subcutaneous edema. There is question a lucency seen within the distal femoral metadiaphysis on the lateral view which could represent a nondisplaced fracture. 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 Linear calcifications seen within the lateral compartmental. Dense vascular calcifications are seen. IMPRESSION: 1. Diffuse osteopenia limits the evaluation. However there appears to be a subtle lucency seen at the distal femoral metadiaphysis on lateral views which could represent a nondisplaced fracture. 2. Chondrocalcinosis which could be seen in CPPD. 3. Moderate knee joint effusion with prepatellar subcutaneous edema. Electronically Signed   By: Prudencio Pair M.D.   On: 01/12/2019 02:17   Ct Head Wo Contrast  Result Date: 01/12/2019 CLINICAL DATA:  Initial evaluation for acute minor head trauma, on anticoagulation. EXAM: CT HEAD WITHOUT CONTRAST TECHNIQUE: Contiguous axial images were obtained from the base of the skull through the vertex without intravenous contrast. COMPARISON:  Prior CT from 05/15/2018. FINDINGS: Brain: Advanced age-related cerebral atrophy with chronic microvascular ischemic disease. Superimposed chronic left cerebellar infarcts. Few additional scattered remote lacunar infarcts noted within the bilateral basal ganglia. No acute intracranial hemorrhage. No acute large vessel territory infarct. No mass lesion, midline shift or mass effect. Diffuse ventricular prominence related to global parenchymal volume loss without hydrocephalus, stable. No extra-axial fluid collection. Vascular: No hyperdense vessel.  Scattered vascular calcifications noted within the carotid siphons. Skull: Scalp soft tissues demonstrate no acute abnormality. Calvarium intact. Sinuses/Orbits: Globes and orbital soft tissues within normal limits. Paranasal sinuses are clear. No mastoid effusion. Other: None. IMPRESSION: 1. No acute intracranial hemorrhage or other abnormality identified. 2. Advanced age-related  cerebral atrophy with chronic microvascular ischemic disease, with superimposed remote left cerebellar infarcts, stable. Electronically Signed   By: Jeannine Boga M.D.   On: 01/12/2019 02:46    Procedures Procedures (including critical care time)  Medications Ordered in ED Medications  cloNIDine (CATAPRES) tablet 0.2 mg (0.2 mg Oral Given 01/12/19 0141)  acetaminophen (TYLENOL) tablet 650 mg (650 mg Oral Given 01/12/19 0141)     Initial Impression / Assessment and Plan / ED Course  I have reviewed the triage vital signs and the nursing notes.  Pertinent labs & imaging results that were available during my care of the patient were reviewed by me and considered in my medical decision making (see chart for details).        83 year old female brought in by her daughter with chief complaint of elevated blood pressure.  Patient has history of dementia not providing meaningful history. Patient's BP is noted to be over A999333 systolic in both arms.  Her cardiac exam, pulmonary exam, and musculoskeletal exam overall is reassuring.  She did have some grimacing with palpation of the left lower extremity.  Given the recent fall and patient being on anticoagulation with some mental status changes, we will get CT head and appropriate radiographs.  From the blood pressure perspective, labs drawn in the triage are within normal limits.  Patient has had UTIs in the recent past, we will get a UA to ensure the mental status decline is not because of it.  We will also give patient 0.2 mg clonidine.  If the BP responds and the  work-up in the ED is negative, she will be discharged with close follow-up with PCP.  We might add as needed hydralazine for SBP over 180 until seen by PCP.  7:01 AM  BP improved.  UA is normal We will discharge patient with recommendation for PCP follow-up.  Final Clinical Impressions(s) / ED Diagnoses   Final diagnoses:  Hypertensive urgency    ED Discharge Orders    None          Varney Biles, MD 01/12/19 [phone]

## 2019-01-12 NOTE — ED Notes (Signed)
Was unable to obtain e-signature from family member at bedside

## 2019-01-12 NOTE — Discharge Instructions (Addendum)
We saw you in the ER for elevated BP and recent fall. All the results in the ER are normal.  No signs of urine infection.  The workup in the ER is not complete, and is limited to screening for life threatening and emergent conditions only, so please see a primary care doctor for further evaluation and BP management.

## 2019-01-12 NOTE — ED Notes (Signed)
Patient transported to X-ray 

## 2019-01-12 NOTE — ED Notes (Signed)
ED Provider at bedside. 

## 2019-01-12 NOTE — ED Notes (Signed)
Pt not available for EKG at this time. Will obtain EKG when they return to room.

## 2019-01-12 NOTE — ED Notes (Addendum)
Discharge instructions discussed with family at bedside. Family verbalized understanding with no questions at this time.

## 2019-01-15 NOTE — Telephone Encounter (Signed)
Patient was sent to ED on 01/11/2019 - 01/12/2019 for elevated bp and fall.

## 2019-01-16 DIAGNOSIS — Z7984 Long term (current) use of oral hypoglycemic drugs: Secondary | ICD-10-CM | POA: Diagnosis not present

## 2019-01-16 DIAGNOSIS — L89153 Pressure ulcer of sacral region, stage 3: Secondary | ICD-10-CM | POA: Diagnosis not present

## 2019-01-16 DIAGNOSIS — E1142 Type 2 diabetes mellitus with diabetic polyneuropathy: Secondary | ICD-10-CM | POA: Diagnosis not present

## 2019-01-16 DIAGNOSIS — I890 Lymphedema, not elsewhere classified: Secondary | ICD-10-CM | POA: Diagnosis not present

## 2019-01-16 DIAGNOSIS — F0390 Unspecified dementia without behavioral disturbance: Secondary | ICD-10-CM | POA: Diagnosis not present

## 2019-01-16 DIAGNOSIS — I10 Essential (primary) hypertension: Secondary | ICD-10-CM | POA: Diagnosis not present

## 2019-01-18 DIAGNOSIS — E1142 Type 2 diabetes mellitus with diabetic polyneuropathy: Secondary | ICD-10-CM | POA: Diagnosis not present

## 2019-01-18 DIAGNOSIS — L89153 Pressure ulcer of sacral region, stage 3: Secondary | ICD-10-CM | POA: Diagnosis not present

## 2019-01-18 DIAGNOSIS — I890 Lymphedema, not elsewhere classified: Secondary | ICD-10-CM | POA: Diagnosis not present

## 2019-01-18 DIAGNOSIS — F0390 Unspecified dementia without behavioral disturbance: Secondary | ICD-10-CM | POA: Diagnosis not present

## 2019-01-18 DIAGNOSIS — Z7984 Long term (current) use of oral hypoglycemic drugs: Secondary | ICD-10-CM | POA: Diagnosis not present

## 2019-01-18 DIAGNOSIS — I10 Essential (primary) hypertension: Secondary | ICD-10-CM | POA: Diagnosis not present

## 2019-01-21 ENCOUNTER — Telehealth: Payer: Self-pay | Admitting: *Deleted

## 2019-01-21 ENCOUNTER — Other Ambulatory Visit: Payer: Self-pay | Admitting: Nurse Practitioner

## 2019-01-21 MED ORDER — ATORVASTATIN CALCIUM 20 MG PO TABS: 20.0000 mg | ORAL_TABLET | Freq: Every day | ORAL | 1 refills | Status: DC

## 2019-01-21 NOTE — Telephone Encounter (Signed)
Patient daughter in law, [name] called and stated that the aid that comes in and helps patient was exposed to Covid 19 and going to get tested today.  Patient has an appointment tomorrow for her wound on her leg to be rechecked. They are wondering if they need to cancel until the testing comes back or if it is ok to come in. Please Advise.    Also requested a refill on her Atorvastatin. Sent to pharmacy.

## 2019-01-21 NOTE — Telephone Encounter (Signed)
Patient to reschedule appointment until COVID-19 testing results comes back.

## 2019-01-21 NOTE — Telephone Encounter (Signed)
Patient daughter in law notified and agreed. Canceled appointment and she will call back once the results come back.

## 2019-01-22 ENCOUNTER — Telehealth: Payer: Self-pay | Admitting: *Deleted

## 2019-01-22 ENCOUNTER — Ambulatory Visit: Payer: Self-pay | Admitting: Family

## 2019-01-22 DIAGNOSIS — F0390 Unspecified dementia without behavioral disturbance: Secondary | ICD-10-CM | POA: Diagnosis not present

## 2019-01-22 DIAGNOSIS — L89153 Pressure ulcer of sacral region, stage 3: Secondary | ICD-10-CM | POA: Diagnosis not present

## 2019-01-22 DIAGNOSIS — E1142 Type 2 diabetes mellitus with diabetic polyneuropathy: Secondary | ICD-10-CM | POA: Diagnosis not present

## 2019-01-22 DIAGNOSIS — I10 Essential (primary) hypertension: Secondary | ICD-10-CM | POA: Diagnosis not present

## 2019-01-22 DIAGNOSIS — I890 Lymphedema, not elsewhere classified: Secondary | ICD-10-CM | POA: Diagnosis not present

## 2019-01-22 DIAGNOSIS — Z7984 Long term (current) use of oral hypoglycemic drugs: Secondary | ICD-10-CM | POA: Diagnosis not present

## 2019-01-22 NOTE — Telephone Encounter (Signed)
Spoke with Janett Billow. Patient canceled her appointment yesterday with [facility] because her aid was exposed to COVID 19 and waiting on test results. Dinah told us to cancel patient's appointment until those results came back.   Janett Billow agreed to the dressing change.   Verbal given to [facility].

## 2019-01-22 NOTE — Telephone Encounter (Signed)
[name] with [facility] called and stated that patient's wound is doing good but the Telfa Pad is sticking to the RLR wound and nurse wants to know if she can change the dressing to a Foam Border Dressing instead. Please Advise.

## 2019-01-22 NOTE — Telephone Encounter (Signed)
She needs follow up visit, it looked like she had one scheduled for today with Dinah but it was cancelled.

## 2019-01-24 DIAGNOSIS — Z7984 Long term (current) use of oral hypoglycemic drugs: Secondary | ICD-10-CM | POA: Diagnosis not present

## 2019-01-24 DIAGNOSIS — I890 Lymphedema, not elsewhere classified: Secondary | ICD-10-CM | POA: Diagnosis not present

## 2019-01-24 DIAGNOSIS — I10 Essential (primary) hypertension: Secondary | ICD-10-CM | POA: Diagnosis not present

## 2019-01-24 DIAGNOSIS — E1142 Type 2 diabetes mellitus with diabetic polyneuropathy: Secondary | ICD-10-CM | POA: Diagnosis not present

## 2019-01-24 DIAGNOSIS — F0390 Unspecified dementia without behavioral disturbance: Secondary | ICD-10-CM | POA: Diagnosis not present

## 2019-01-24 DIAGNOSIS — L89153 Pressure ulcer of sacral region, stage 3: Secondary | ICD-10-CM | POA: Diagnosis not present

## 2019-01-25 DIAGNOSIS — Z86711 Personal history of pulmonary embolism: Secondary | ICD-10-CM | POA: Diagnosis not present

## 2019-01-25 DIAGNOSIS — Z7901 Long term (current) use of anticoagulants: Secondary | ICD-10-CM | POA: Diagnosis not present

## 2019-01-25 DIAGNOSIS — F0390 Unspecified dementia without behavioral disturbance: Secondary | ICD-10-CM | POA: Diagnosis not present

## 2019-01-25 DIAGNOSIS — E1142 Type 2 diabetes mellitus with diabetic polyneuropathy: Secondary | ICD-10-CM | POA: Diagnosis not present

## 2019-01-25 DIAGNOSIS — L89153 Pressure ulcer of sacral region, stage 3: Secondary | ICD-10-CM | POA: Diagnosis not present

## 2019-01-25 DIAGNOSIS — R1312 Dysphagia, oropharyngeal phase: Secondary | ICD-10-CM | POA: Diagnosis not present

## 2019-01-25 DIAGNOSIS — Z7984 Long term (current) use of oral hypoglycemic drugs: Secondary | ICD-10-CM | POA: Diagnosis not present

## 2019-01-25 DIAGNOSIS — I10 Essential (primary) hypertension: Secondary | ICD-10-CM | POA: Diagnosis not present

## 2019-01-25 DIAGNOSIS — I890 Lymphedema, not elsewhere classified: Secondary | ICD-10-CM | POA: Diagnosis not present

## 2019-01-25 DIAGNOSIS — Z86718 Personal history of other venous thrombosis and embolism: Secondary | ICD-10-CM | POA: Diagnosis not present

## 2019-01-25 DIAGNOSIS — Z8673 Personal history of transient ischemic attack (TIA), and cerebral infarction without residual deficits: Secondary | ICD-10-CM | POA: Diagnosis not present

## 2019-01-27 ENCOUNTER — Emergency Department (HOSPITAL_COMMUNITY)
Admission: EM | Admit: 2019-01-27 | Discharge: 2019-01-27 | Disposition: A | Discharge status: Home / Self Care | Payer: Medicare Other | Attending: Emergency Medicine | Admitting: Emergency Medicine

## 2019-01-27 ENCOUNTER — Encounter (HOSPITAL_COMMUNITY): Payer: Self-pay

## 2019-01-27 ENCOUNTER — Other Ambulatory Visit: Payer: Self-pay

## 2019-01-27 DIAGNOSIS — Z86718 Personal history of other venous thrombosis and embolism: Secondary | ICD-10-CM | POA: Insufficient documentation

## 2019-01-27 DIAGNOSIS — I10 Essential (primary) hypertension: Secondary | ICD-10-CM | POA: Insufficient documentation

## 2019-01-27 DIAGNOSIS — R55 Syncope and collapse: Secondary | ICD-10-CM

## 2019-01-27 DIAGNOSIS — Z7901 Long term (current) use of anticoagulants: Secondary | ICD-10-CM | POA: Diagnosis not present

## 2019-01-27 DIAGNOSIS — Z86711 Personal history of pulmonary embolism: Secondary | ICD-10-CM | POA: Diagnosis not present

## 2019-01-27 DIAGNOSIS — Z8673 Personal history of transient ischemic attack (TIA), and cerebral infarction without residual deficits: Secondary | ICD-10-CM | POA: Insufficient documentation

## 2019-01-27 DIAGNOSIS — I959 Hypotension, unspecified: Secondary | ICD-10-CM | POA: Insufficient documentation

## 2019-01-27 DIAGNOSIS — Z79899 Other long term (current) drug therapy: Secondary | ICD-10-CM | POA: Diagnosis not present

## 2019-01-27 DIAGNOSIS — E119 Type 2 diabetes mellitus without complications: Secondary | ICD-10-CM | POA: Insufficient documentation

## 2019-01-27 DIAGNOSIS — I491 Atrial premature depolarization: Secondary | ICD-10-CM | POA: Diagnosis not present

## 2019-01-27 DIAGNOSIS — F0390 Unspecified dementia without behavioral disturbance: Secondary | ICD-10-CM | POA: Insufficient documentation

## 2019-01-27 DIAGNOSIS — Z7984 Long term (current) use of oral hypoglycemic drugs: Secondary | ICD-10-CM | POA: Diagnosis not present

## 2019-01-27 DIAGNOSIS — R4182 Altered mental status, unspecified: Secondary | ICD-10-CM | POA: Diagnosis present

## 2019-01-27 DIAGNOSIS — R4020 Unspecified coma: Secondary | ICD-10-CM | POA: Diagnosis not present

## 2019-01-27 DIAGNOSIS — R410 Disorientation, unspecified: Secondary | ICD-10-CM | POA: Diagnosis not present

## 2019-01-27 LAB — COMPREHENSIVE METABOLIC PANEL
ALT: 12 U/L (ref 0–44)
AST: 17 U/L (ref 15–41)
Albumin: 3 g/dL — ABNORMAL LOW (ref 3.5–5.0)
Alkaline Phosphatase: 65 U/L (ref 38–126)
Anion gap: 15 (ref 5–15)
BUN: 20 mg/dL (ref 8–23)
CO2: 25 mmol/L (ref 22–32)
Calcium: 9.3 mg/dL (ref 8.9–10.3)
Chloride: 100 mmol/L (ref 98–111)
Creatinine, Ser: 0.43 mg/dL — ABNORMAL LOW (ref 0.44–1.00)
GFR calc Af Amer: >60 mL/min (ref >60)
GFR calc non Af Amer: >60 mL/min (ref >60)
Glucose, Bld: 179 mg/dL — ABNORMAL HIGH (ref 70–99)
Potassium: 4 mmol/L (ref 3.5–5.1)
Sodium: 140 mmol/L (ref 135–145)
Total Bilirubin: 0.1 mg/dL — ABNORMAL LOW (ref 0.3–1.2)
Total Protein: 6 g/dL — ABNORMAL LOW (ref 6.5–8.1)

## 2019-01-27 LAB — URINALYSIS, ROUTINE W REFLEX MICROSCOPIC
Bilirubin Urine: NEGATIVE
Glucose, UA: NEGATIVE mg/dL
Hgb urine dipstick: NEGATIVE
Ketones, ur: NEGATIVE mg/dL
Leukocytes,Ua: NEGATIVE
Nitrite: NEGATIVE
Protein, ur: 30 mg/dL — AB
Specific Gravity, Urine: 1.015 (ref 1.005–1.030)
pH: 6 (ref 5.0–8.0)

## 2019-01-27 LAB — CBC WITH DIFFERENTIAL/PLATELET
Abs Immature Granulocytes: 0.02 10*3/uL (ref 0.00–0.07)
Basophils Absolute: 0 10*3/uL (ref 0.0–0.1)
Basophils Relative: 0 %
Eosinophils Absolute: 0 10*3/uL (ref 0.0–0.5)
Eosinophils Relative: 0 %
HCT: 34.1 % — ABNORMAL LOW (ref 36.0–46.0)
Hemoglobin: 10.4 g/dL — ABNORMAL LOW (ref 12.0–15.0)
Immature Granulocytes: 1 %
Lymphocytes Relative: 20 %
Lymphs Abs: 0.8 10*3/uL (ref 0.7–4.0)
MCH: 29.1 pg (ref 26.0–34.0)
MCHC: 30.5 g/dL (ref 30.0–36.0)
MCV: 95.3 fL (ref 80.0–100.0)
Monocytes Absolute: 0.3 10*3/uL (ref 0.1–1.0)
Monocytes Relative: 8 %
Neutro Abs: 2.8 10*3/uL (ref 1.7–7.7)
Neutrophils Relative %: 71 %
Platelets: 245 10*3/uL (ref 150–400)
RBC: 3.58 MIL/uL — ABNORMAL LOW (ref 3.87–5.11)
RDW: 12.9 % (ref 11.5–15.5)
WBC: 4 10*3/uL (ref 4.0–10.5)
nRBC: 0 % (ref 0.0–0.2)

## 2019-01-27 MED ORDER — CLONIDINE HCL 0.1 MG PO TABS
0.1000 mg | ORAL_TABLET | Freq: Once | ORAL | Status: AC
  Administered 2019-01-27: 0.1 mg via ORAL
  Filled 2019-01-27: qty 1

## 2019-01-27 MED ORDER — SODIUM CHLORIDE 0.9 % IV BOLUS
500.0000 mL | Freq: Once | INTRAVENOUS | Status: AC
  Administered 2019-01-27: 500 mL via INTRAVENOUS

## 2019-01-27 NOTE — ED Notes (Signed)
[facility] [employer] notified of need for transport of pt back to residence.  

## 2019-01-27 NOTE — ED Notes (Signed)
Purewick placed at this time.

## 2019-01-27 NOTE — ED Triage Notes (Signed)
Per EMS, Called out today due to pt becoming unresponsive. On EMS arrival pt was unresponsive to voice or painful stimuli, and drooling. Home health believed the pt was having a syncopal episode, had a blood pressure 90/50. Pt retook blood pressure and normal pressure. Pt began to wake up more on arrival to ED. Pt is now reactive to painful stimuli, pupils 1-25mm and unreactive. Pt has been compliant with home medication, and at baseline is confused, but able to converse.

## 2019-01-27 NOTE — ED Provider Notes (Signed)
[facility] DEPT Provider Note   CSN: [id number] Arrival date & time: 01/27/19  1508     History   Chief Complaint Chief Complaint  Patient presents with  . Altered Mental Status    HPI [name] is a 83 y.o. female.     HPI   Patient transferred by EMS for evaluation of unresponsive state with hypotension at the scene.  A home health nurse was with her and found that her blood pressure was 90/50.  Therefore EMS was summoned.  Apparently during transport she recovered somewhat possibly to her baseline.  On arrival to the ED the patient was unresponsive but has since been responsive, and speaking although her speech is garbled.  The patient cannot give any history.  Level 5 caveat-altered mental status  Past Medical History:  Diagnosis Date  . Dementia ([place])   . Diabetes mellitus type II 03/21/2011  . DVT (deep venous thrombosis) ([place]) 03/21/2011  . Hyperlipidemia 03/21/2011  . Hypertension 03/21/2011  . Lymphedema of leg 03/21/2011  . Pulmonary embolism ([place]) 03/21/2011  . Stroke [facility])     Patient Active Problem List   Diagnosis Date Noted  . Skin ulcer of sacrum with fat layer exposed ([place]) 07/22/2017  . Lipoma of left lower extremity 07/22/2017  . Estrogen deficiency 07/22/2017  . Hyperlipidemia associated with type 2 diabetes mellitus ([place]) 07/22/2017  . History of TIA (transient ischemic attack) 01/10/2017  . Atrial septal aneurysm 11/22/2016  . Bilateral lower extremity edema 11/22/2016  . Iron deficiency anemia due to chronic blood loss 11/16/2016  . TIA (transient ischemic attack) 10/05/2016  . Lower urinary tract infectious disease   . Current use of long term anticoagulation 08/24/2016  . Metabolic encephalopathy 99991111  . Senile dementia without behavioral disturbance ([place]) 08/23/2016  . Acute CVA (cerebrovascular accident) ([place])   . Left-sided weakness 05/11/2016  . Dysarthria 05/11/2016  . Acute left-sided  weakness 05/11/2016  . History of pulmonary embolism 03/21/2011  . History of DVT (deep vein thrombosis) 03/21/2011  . Type 2 diabetes mellitus with diabetic polyneuropathy, without long-term current use of insulin ([place]) 03/21/2011  . Lymphedema of leg 03/21/2011  . Hyperlipidemia 03/21/2011  . Hypertension 03/21/2011    Past Surgical History:  Procedure Laterality Date  . CHOLECYSTECTOMY  2015  . SKIN TAG REMOVAL  07/25/2016  . VAGINAL HYSTERECTOMY     unknown of date  . VASCULAR SURGERY       OB History    Gravida  3   Para  3   Term  3   Preterm      AB      Living  3     SAB      TAB      Ectopic      Multiple      Live Births  3            Home Medications    Prior to Admission medications   Medication Sig Start Date End Date Taking? Authorizing Provider  acetaminophen (TYLENOL) 500 MG tablet Take 500 mg by mouth as needed (general pain).    Yes [provider]  atorvastatin (LIPITOR) 20 MG tablet Take 1 tablet (20 mg total) by mouth daily at 6 PM. 01/21/19  Yes Eubanks, Carlos American, NP  Cholecalciferol (VITAMIN D-3) 125 MCG (5000 UT) TABS Take 5,000 Units by mouth daily.   Yes [provider]  cloNIDine (CATAPRES) 0.1 MG tablet Take 1 tablet (0.1 mg total)  by mouth every 8 (eight) hours. May take an additional tablet if SBP >170 Patient taking differently: Take 0.1 mg by mouth 2 (two) times daily. May take an additional tablet if SBP >170  12/31/18  Yes [name], NP  diclofenac sodium (VOLTAREN) 1 % GEL Apply 1 application topically 2 (two) times daily as needed (pain).    Yes [provider]  gabapentin (NEURONTIN) 300 MG capsule Take 1 capsule (300 mg total) by mouth 2 (two) times daily. 06/19/18  Yes [name], NP  losartan (COZAAR) 25 MG tablet Take 25 mg by mouth daily. Take along with 50 mg to equal 75   Yes [provider]  losartan (COZAAR) 50 MG tablet Take 50 mg by mouth daily. Take 1 along  with 25 mg to equal 75 mg total   Yes [provider]  metFORMIN (GLUCOPHAGE) 500 MG tablet 1000 mg by mouth twice daily with breakfast and Dinner Patient taking differently: Take 500-1,000 mg by mouth See admin instructions. Take 2 tablets every morning and take 1 tablet every evening 12/04/18  Yes Ngetich, Dinah C, NP  polyethylene glycol (MIRALAX / GLYCOLAX) packet Take 17 g by mouth daily.    Yes [provider]  Probiotic Product (PROBIOTIC PO) Take 1 capsule by mouth daily.   Yes [provider]  Rivaroxaban (XARELTO) 15 MG TABS tablet Take 1 tablet (15 mg total) by mouth daily with supper. 11/28/18  Yes [name], NP  Vitamins A & D (VITAMIN A & D) ointment Apply 1 application topically as needed for dry skin.   Yes [provider]  ACCU-CHEK SOFTCLIX LANCETS lancets 1 each by Other route daily. Use as instructed Dx: E11.9 10/18/17   Gildardo Cranker, DO  glucose blood (ACCU-CHEK AVIVA PLUS) test strip 1 each by Other route 2 (two) times daily. E11.9 10/18/17   Gildardo Cranker, DO  Wound Dressings ([place]) Apply to sacrum and change every day  as needed    [provider]    Family History Family History  Problem Relation Age of Onset  . Heart attack Mother   . COPD Sister   . Stroke Sister   . Bipolar disorder Daughter     Social History Social History   Tobacco Use  . Smoking status: Never Smoker  . Smokeless tobacco: Never Used  Substance Use Topics  . Alcohol use: No  . Drug use: No     Allergies   Patient has no known allergies.   Review of Systems Review of Systems  All other systems reviewed and are negative.    Physical Exam Updated Vital Signs BP (!) 208/91   Pulse 66   Temp 97.7 F (36.5 C) (Oral)   Resp 12   SpO2 93%   Physical Exam Vitals signs and nursing note reviewed.  Constitutional:      General: She is not in acute distress.    Appearance: She is well-developed. She is ill-appearing.  She is not toxic-appearing or diaphoretic.     Comments: Elderly, frail  HENT:     Head: Normocephalic and atraumatic.     Right Ear: External ear normal.     Left Ear: External ear normal.     Mouth/Throat:     Mouth: Mucous membranes are moist.     Pharynx: No oropharyngeal exudate.  Eyes:     Conjunctiva/sclera: Conjunctivae normal.     Pupils: Pupils are equal, round, and reactive to light.  Neck:  Musculoskeletal: Normal range of motion and neck supple.     Trachea: Phonation normal.  Cardiovascular:     Rate and Rhythm: Normal rate and regular rhythm.     Heart sounds: Normal heart sounds.  Pulmonary:     Effort: Pulmonary effort is normal.     Breath sounds: Normal breath sounds.  Abdominal:     General: There is no distension.     Palpations: Abdomen is soft.     Tenderness: There is no abdominal tenderness.  Musculoskeletal: Normal range of motion.        General: Swelling (Bilateral lower legs left greater than right.) present. No tenderness or signs of injury.  Skin:    General: Skin is warm and dry.     Coloration: Skin is not jaundiced or pale.  Neurological:     Mental Status: She is alert.     Cranial Nerves: No cranial nerve deficit.     Motor: No abnormal muscle tone.     Comments: She is alert and follows examiner with her eyes.  She attempts to speak but speech is unintelligible.  Psychiatric:     Comments: Lethargic      ED Treatments / Results  Labs (all labs ordered are listed, but only abnormal results are displayed) Labs Reviewed  URINALYSIS, ROUTINE W REFLEX MICROSCOPIC - Abnormal; Notable for the following components:      Result Value   Protein, ur 30 (*)    Bacteria, UA RARE (*)    All other components within normal limits  COMPREHENSIVE METABOLIC PANEL - Abnormal; Notable for the following components:   Glucose, Bld 179 (*)    Creatinine, Ser 0.43 (*)    Total Protein 6.0 (*)    Albumin 3.0 (*)    Total Bilirubin 0.1 (*)    All  other components within normal limits  CBC WITH DIFFERENTIAL/PLATELET - Abnormal; Notable for the following components:   RBC 3.58 (*)    Hemoglobin 10.4 (*)    HCT 34.1 (*)    All other components within normal limits    EKG None  Radiology No results found.  Procedures Procedures (including critical care time)  Medications Ordered in ED Medications  sodium chloride 0.9 % bolus 500 mL (500 mLs Intravenous New Bag/Given 01/27/19 1704)  cloNIDine (CATAPRES) tablet 0.1 mg (0.1 mg Oral Given 01/27/19 2121)     Initial Impression / Assessment and Plan / ED Course  I have reviewed the triage vital signs and the nursing notes.  Pertinent labs & imaging results that were available during my care of the patient were reviewed by me and considered in my medical decision making (see chart for details).  Clinical Course as of Jan 26 2125  Sun Jan 27, 2019  1909 Normal except protein low, WBCs present with rare bacteria  Urinalysis, Routine w reflex microscopic(!) [EW]  1910 Normal except glucose high, creatinine low, total protein low, albumin low, total bilirubin low  Comprehensive metabolic panel(!) [EW]  0000000 Normal except hemoglobin low  CBC with Differential(!) [EW]    Clinical Course User Index [EW] Daleen Bo, MD        Patient Vitals for the past 24 hrs:  BP Temp Temp src Pulse Resp SpO2  01/27/19 2121 (!) 208/91 - - - - -  01/27/19 2037 (!) 193/95 - - 66 12 93 %  01/27/19 1830 (!) 196/92 - - 74 12 99 %  01/27/19 1803 (!) 160/90 - - 67 11 98 %  01/27/19 1535 139/69 97.7 F (36.5 C) Oral 68 19 100 %  01/27/19 1519 - - - - - 97 %    9:26 PM Reevaluation with update and discussion. After initial assessment and treatment, an updated evaluation reveals patient is sitting up eating and comfortable.  Findings discussed with the daughter in law and all questions were answered. Daleen Bo   Medical Decision Making: Apparent syncopal episode related to low blood  pressure/vasovagal episode.  Patient improved with treatment and blood pressure stabilized high. Treatment with oral anti-hypertensives likely cause of blood pressure elevation.  Patient appears to have a brittle blood pressure, and does not show signs of hypertensive urgency at this time.  There is no indication for acute lowering of the blood pressure at this time.  Doubt serious bacterial infection, metabolic instability or impending vascular collapse.  No  CRITICAL CARE-no Performed by: Daleen Bo  Nursing Notes Reviewed/ Care Coordinated Applicable Imaging Reviewed Interpretation of Laboratory Data incorporated into ED treatment  The patient appears reasonably screened and/or stabilized for discharge and I doubt any other medical condition or other [facility] requiring further screening, evaluation, or treatment in the ED at this time prior to discharge.  Plan: Home Medications-continue usual; Home Treatments-push fluids fluid and stooling; return here if the recommended treatment, does not improve the symptoms; Recommended follow up-PCP as scheduled, and as needed    Final Clinical Impressions(s) / ED Diagnoses   Final diagnoses:  Hypotension, unspecified hypotension type  Syncope, unspecified syncope type    ED Discharge Orders    None       Daleen Bo, MD 01/27/19 2132

## 2019-01-27 NOTE — ED Notes (Signed)
Transportation cancelled for patient, family member taking her home

## 2019-01-27 NOTE — Discharge Instructions (Addendum)
She likely had a syncopal episode from low blood pressure.  Her blood pressure appears very difficult to manage, and needs to be watched closely.  If she feels dizzy or weak, have her elevate her feet and lay her head back.  Continue giving her her regular medications, and encourage her to eat and drink well.  As you are doing, continue using stool softeners to improve bowel movements.  Return here, if needed.  See her doctor for regular ongoing medical care.

## 2019-01-27 NOTE — ED Notes (Signed)
Pt's daughter in law states, Pt wont eat the chicken noodle soup. [place] sandwich provided instead.

## 2019-01-27 NOTE — ED Notes (Signed)
Gave Pts daughter in law some soup for patient. She said she will assist pt with eating.

## 2019-01-28 ENCOUNTER — Encounter: Payer: Self-pay | Admitting: Family

## 2019-01-28 ENCOUNTER — Other Ambulatory Visit: Payer: Self-pay

## 2019-01-28 ENCOUNTER — Ambulatory Visit (INDEPENDENT_AMBULATORY_CARE_PROVIDER_SITE_OTHER): Payer: Medicare Other | Admitting: Family

## 2019-01-28 VITALS — BP 158/74 | HR 74 | Temp 98.0°F | Resp 16 | Ht 67.0 in

## 2019-01-28 DIAGNOSIS — R55 Syncope and collapse: Secondary | ICD-10-CM | POA: Diagnosis not present

## 2019-01-28 DIAGNOSIS — S81801D Unspecified open wound, right lower leg, subsequent encounter: Secondary | ICD-10-CM | POA: Diagnosis not present

## 2019-01-28 DIAGNOSIS — I10 Essential (primary) hypertension: Secondary | ICD-10-CM

## 2019-01-28 MED ORDER — LOSARTAN POTASSIUM 25 MG PO TABS: 25.0000 mg | ORAL_TABLET | Freq: Every day | ORAL | 3 refills | Status: DC

## 2019-01-28 MED ORDER — LOSARTAN POTASSIUM 50 MG PO TABS: 50.0000 mg | ORAL_TABLET | Freq: Every day | ORAL | 3 refills | Status: DC

## 2019-01-28 NOTE — Progress Notes (Signed)
Provider: Chemere Steffler FNP-C  [name], NP  Patient Care Team: [name], NP as PCP - General (Geriatric Medicine) [name], MD as Consulting Physician (Oncology) [name], [name], LCSW as Social Worker (Licensed Clinical Social Worker) [name], RN as Registered Nurse [facility] and Palliative Medicine)  Extended Emergency Contact Information Primary Emergency Contact: Lasseigne,[name] Address: [address]          [place], [place] [address] Johnnette Litter of [place] Phone: [phone] Mobile Phone: [phone] Relation: Relative Secondary Emergency Contact: Cottie Banda Address: [place], [place] [address] Johnnette Litter of [place] Phone: [phone] Relation: Son  Code Status:  DNR Goals of care: Advanced Directive information Advanced Directives 11/02/2018  Does Patient Have a Medical Advance Directive? Yes  Type of Paramedic of [place];Living will  Does patient want to make changes to medical advance directive? -  Copy of [place] in Chart? -     Chief Complaint  Patient presents with   Follow-up    Check right leg wound at office visit. Patient was seen in ER recently due to syncope and elevated BP.     HPI:  Pt is a 83 y.o. female seen today for an acute visit for evaluation of right leg wound.Patient was seen in ER 01/11/2019 for hypertensive urgency after she was evaluated by [facility] SB/P was over 200's.she was advised to send patient to ED for evaluation.Patient's daughter also reported that 2 weeks ago patient had an episode where she started drooping and became unresponsive for a short duration.Thereafter, the daughter in law noted that patient has not been as energetic as she had been.she states  her oral intake has gone down.One week ago patient fell from her bedside commode onto the left side.No acute injuries reported.She takes her medication as directed.she  was treated with extra dose of clonidine without any help.CT scan done due to her change in mental status.Lab work and U/A done was unremarkable.  She was also in the ED  01/27/2019 for hypotension due to syncope.she had a episode of unresponsive state HHN was with patient found B/p 90/50.EMS was called but patient recovered during transportation.she was unresponsive upon arrival to the ED but recovered.Her speech was garbled.Vasovagal suspected.patient stabilized was discharged home.Encouraged to increase fluid intake.     Right leg wound - daughter in law  states home health continue to manage dressing.states had another area close to the previous wound that blister but down today and had drainage.Blister site was painful.Nurse told her might need to be opened like previous wound. She denies any fever or chills.    Past Medical History:  Diagnosis Date   Dementia ([place])    Diabetes mellitus type II 03/21/2011   DVT (deep venous thrombosis) ([place]) 03/21/2011   Hyperlipidemia 03/21/2011   Hypertension 03/21/2011   Lymphedema of leg 03/21/2011   Pulmonary embolism ([place]) 03/21/2011   Stroke [facility])    Past Surgical History:  Procedure Laterality Date   CHOLECYSTECTOMY  2015   SKIN TAG REMOVAL  07/25/2016   VAGINAL HYSTERECTOMY     unknown of date   VASCULAR SURGERY      No Known Allergies  Outpatient Encounter Medications as of 01/28/2019  Medication Sig   ACCU-CHEK SOFTCLIX LANCETS lancets 1 each by Other route daily. Use as instructed Dx: E11.9   acetaminophen (TYLENOL) 500 MG tablet Take 500 mg by mouth as needed (  general pain).    atorvastatin (LIPITOR) 20 MG tablet Take 1 tablet (20 mg total) by mouth daily at 6 PM.   Cholecalciferol (VITAMIN D-3) 125 MCG (5000 UT) TABS Take 5,000 Units by mouth daily.   cloNIDine (CATAPRES) 0.1 MG tablet Take 1 tablet (0.1 mg total) by mouth every 8 (eight) hours. May take an additional tablet if SBP >170 (Patient taking differently:  Take 0.1 mg by mouth 2 (two) times daily. May take an additional tablet if SBP >170 )   diclofenac sodium (VOLTAREN) 1 % GEL Apply 1 application topically 2 (two) times daily as needed (pain).    gabapentin (NEURONTIN) 300 MG capsule Take 1 capsule (300 mg total) by mouth 2 (two) times daily.   glucose blood (ACCU-CHEK AVIVA PLUS) test strip 1 each by Other route 2 (two) times daily. E11.9   losartan (COZAAR) 25 MG tablet Take 25 mg by mouth daily. Take along with 50 mg to equal 75   losartan (COZAAR) 50 MG tablet Take 50 mg by mouth daily. Take 1 along with 25 mg to equal 75 mg total   metFORMIN (GLUCOPHAGE) 500 MG tablet 1000 mg by mouth twice daily with breakfast and Dinner (Patient taking differently: Take 500-1,000 mg by mouth See admin instructions. Take 2 tablets every morning and take 1 tablet every evening)   polyethylene glycol (MIRALAX / GLYCOLAX) packet Take 17 g by mouth daily.    Probiotic Product (PROBIOTIC PO) Take 1 capsule by mouth daily.   Rivaroxaban (XARELTO) 15 MG TABS tablet Take 1 tablet (15 mg total) by mouth daily with supper.   Vitamins A & D (VITAMIN A & D) ointment Apply 1 application topically as needed for dry skin.   Wound Dressings (MEPILEX EX) Apply to sacrum and change every day  as needed   No facility-administered encounter medications on file as of 01/28/2019.     Review of Systems  Unable to perform ROS: Dementia (additionalinformation provided by her daughter )  Constitutional: Negative for chills, fatigue and fever.  HENT: Negative for congestion, rhinorrhea, sinus pressure, sinus pain, sneezing, sore throat and trouble swallowing.   Eyes: Negative for discharge, redness, itching and visual disturbance.  Respiratory: Negative for cough, chest tightness, shortness of breath and wheezing.   Cardiovascular: Negative for chest pain, palpitations and leg swelling.  Gastrointestinal: Negative for abdominal distention, abdominal pain, constipation,  diarrhea, nausea and vomiting.  Endocrine: Negative for cold intolerance, heat intolerance, polydipsia, polyphagia and polyuria.  Genitourinary: Negative for difficulty urinating, dysuria, flank pain, frequency, hematuria and urgency.  Musculoskeletal: Positive for arthralgias and gait problem.  Skin: Positive for wound. Negative for color change, pallor and rash.       Right leg wound as per HPI  Neurological: Negative for dizziness, light-headedness and headaches.       Syncope per HPI   Psychiatric/Behavioral: Negative for agitation and sleep disturbance. The patient is not nervous/anxious.      There is no immunization history on file for this patient. Pertinent  Health Maintenance Due  Topic Date Due   FOOT EXAM  08/08/1945   OPHTHALMOLOGY EXAM  01/24/2018   PNA vac Low Risk Adult (1 of 2 - PCV13) 08/21/2019 (Originally 08/08/2002)   HEMOGLOBIN A1C  03/05/2019   DEXA SCAN  Completed   INFLUENZA VACCINE  Discontinued   Fall Risk  01/28/2019 12/04/2018 11/12/2018 11/05/2018 10/24/2018  Falls in the past year? 1 0 0 0 1  Number falls in past yr: 0 0 0  0 0  Comment - - - - -  Injury with Fall? 0 0 0 0 0  Risk Factor Category  - - - - -  Risk for fall due to : - - - - -  Follow up - - - - -    Vitals:   01/28/19 1317  BP: (!) 158/74  Pulse: 74  Resp: 16  Temp: 98 F (36.7 C)  Height: 5\' 7"  (1.702 m)   Body mass index is 20.36 kg/m. Physical Exam Vitals signs reviewed.  Constitutional:      General: She is not in acute distress.    Appearance: She is normal weight. She is not ill-appearing.  HENT:     Head: Normocephalic.     Nose: Nose normal. No congestion or rhinorrhea.     Mouth/Throat:     Mouth: Mucous membranes are moist.     Pharynx: Oropharynx is clear. No oropharyngeal exudate or posterior oropharyngeal erythema.  Eyes:     General: No scleral icterus.       Right eye: No discharge.        Left eye: No discharge.     Extraocular Movements: Extraocular  movements intact.     Conjunctiva/sclera: Conjunctivae normal.     Pupils: Pupils are equal, round, and reactive to light.  Neck:     Musculoskeletal: Normal range of motion. No neck rigidity or muscular tenderness.  Cardiovascular:     Rate and Rhythm: Normal rate and regular rhythm.     Pulses: Normal pulses.     Heart sounds: Normal heart sounds. No murmur. No friction rub. No gallop.   Pulmonary:     Effort: Pulmonary effort is normal. No respiratory distress.     Breath sounds: Normal breath sounds. No wheezing, rhonchi or rales.  Chest:     Chest wall: No tenderness.  Abdominal:     General: Bowel sounds are normal. There is no distension.     Palpations: Abdomen is soft. There is no mass.     Tenderness: There is no abdominal tenderness. There is no right CVA tenderness, guarding or rebound.  Musculoskeletal:        General: No swelling or tenderness.     Right lower leg: Edema present.     Left lower leg: Edema present.     Comments: wheelchair bound.bilateral lower extremities trace edema.   Lymphadenopathy:     Cervical: No cervical adenopathy.  Skin:    General: Skin is warm.     Coloration: Skin is not pale.     Findings: No bruising, erythema or rash.     Comments: Right upper lateral leg previous wound less than 1 cm oval size x 2 wound bed red in color surrounding skin without any signs of infections.Adjust to wound quarter size wound with dark eschar.surrounding skin tissue without any redness or tenderness. Wound cleansed with saline ,pat dry,triple antibiotic ointment to wound bed,covered with foam dressing and wrapped with Kerlix and secured with paper tape.   Neurological:     Mental Status: She is alert. Mental status is at baseline.     Cranial Nerves: No cranial nerve deficit.     Sensory: No sensory deficit.     Motor: No weakness.     Coordination: Coordination normal.     Gait: Gait abnormal.  Psychiatric:        Mood and Affect: Mood normal.         Behavior: Behavior normal.  Thought Content: Thought content normal.        Judgment: Judgment normal.    Labs reviewed: Recent Labs    11/02/18 1445 01/11/19 2004 01/27/19 1636  NA 138 139 140  K 4.6 3.9 4.0  CL 100 101 100  CO2 27 25 25   GLUCOSE 181* 198* 179*  BUN 15 23 20   CREATININE 0.36* 0.49 0.43*  CALCIUM 9.1 9.4 9.3   Recent Labs    09/03/18 1143 10/29/18 1034 01/11/19 2004 01/27/19 1636  AST 11* 11 16 17   ALT 9 9 14 12   ALKPHOS 71  --  76 65  BILITOT 0.4 0.4 0.6 0.1*  PROT 7.5 6.3 7.5 6.0*  ALBUMIN 4.1  --  3.4* 3.0*   Recent Labs    11/02/18 1445 01/11/19 2004 01/27/19 1636  WBC 6.5 6.5 4.0  NEUTROABS 4.4 4.0 2.8  HGB 11.8* 12.3 10.4*  HCT 38.3 40.1 34.1*  MCV 95.8 95.0 95.3  PLT 212 283 245   Lab Results  Component Value Date   TSH 2.39 01/10/2017   Lab Results  Component Value Date   HGBA1C 8.2 (H) 09/03/2018   Lab Results  Component Value Date   CHOL 173 09/03/2018   HDL 52 09/03/2018   LDLCALC 103 (H) 09/03/2018   TRIG 90 09/03/2018   CHOLHDL 3.3 09/03/2018    Significant Diagnostic Results in last 30 days:  Dg Pelvis 1-2 Views  Result Date: 01/12/2019 CLINICAL DATA:  Fall and dementia EXAM: PELVIS - 1-2 VIEW COMPARISON:  August 23, 2016 FINDINGS: There is diffuse osteopenia which limits the evaluation. No definite acute fracture or malalignment. There is dense vascular calcifications. IMPRESSION: Diffuse osteopenia which limits evaluation. No acute osseous abnormality. If there is high clinical suspicion for occult hip fracture or the patient refuses to weightbear, consider further evaluation with MRI. Although CT is expeditious, evidence is lacking regarding accuracy of CT over plain film radiography. Electronically Signed   By: Prudencio Pair M.D.   On: 01/12/2019 02:28   Dg Knee 2 Views Left  Result Date: 01/12/2019 CLINICAL DATA:  Fall dementia EXAM: LEFT KNEE - 1-2 VIEW COMPARISON:  None. FINDINGS: There is diffuse  demineralization of the osseous structure which limits the evaluation. There is a moderate knee joint effusion with prepatellar subcutaneous edema. There is question a lucency seen within the distal femoral metadiaphysis on the lateral view which could represent a nondisplaced fracture. 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 Linear calcifications seen within the lateral compartmental. Dense vascular calcifications are seen. IMPRESSION: 1. Diffuse osteopenia limits the evaluation. However there appears to be a subtle lucency seen at the distal femoral metadiaphysis on lateral views which could represent a nondisplaced fracture. 2. Chondrocalcinosis which could be seen in CPPD. 3. Moderate knee joint effusion with prepatellar subcutaneous edema. Electronically Signed   By: Prudencio Pair M.D.   On: 01/12/2019 02:17   Ct Head Wo Contrast  Result Date: 01/12/2019 CLINICAL DATA:  Initial evaluation for acute minor head trauma, on anticoagulation. EXAM: CT HEAD WITHOUT CONTRAST TECHNIQUE: Contiguous axial images were obtained from the base of the skull through the vertex without intravenous contrast. COMPARISON:  Prior CT from 05/15/2018. FINDINGS: Brain: Advanced age-related cerebral atrophy with chronic microvascular ischemic disease. Superimposed chronic left cerebellar infarcts. Few additional scattered remote lacunar infarcts noted within the bilateral basal ganglia. No acute intracranial hemorrhage. No acute large vessel territory infarct. No mass lesion, midline shift or mass effect. Diffuse ventricular prominence related  to global parenchymal volume loss without hydrocephalus, stable. No extra-axial fluid collection. Vascular: No hyperdense vessel. Scattered vascular calcifications noted within the carotid siphons. Skull: Scalp soft tissues demonstrate no acute abnormality. Calvarium intact. Sinuses/Orbits: Globes and orbital soft tissues within normal limits. Paranasal  sinuses are clear. No mastoid effusion. Other: None. IMPRESSION: 1. No acute intracranial hemorrhage or other abnormality identified. 2. Advanced age-related cerebral atrophy with chronic microvascular ischemic disease, with superimposed remote left cerebellar infarcts, stable. Electronically Signed   By: Jeannine Boga M.D.   On: 01/12/2019 02:46    Assessment/Plan 1. Essential hypertension B/p stable.continue current medication.HHN to continue to monitor blood pressure.  2. Wound of right leg, subsequent encounter Afebrile.Right upper lateral leg previous wound less than 1 cm oval size x 2 wound bed red in color surrounding skin without any signs of infections.Adjust to wound quarter size wound with dark eschar.surrounding skin tissue without any redness or tenderness.Wound cleansed with saline pat dry and triple antibiotic ointment applied to wound bed and covered with foam dressing for protection and absorption.wraped with Kerlix and secured with tape.   - For home use only DME Other see comment: Home health Nurse( family may change dressing in between [facility] visit ) to cleanse right leg wound with saline,pat dry,apply santyl to dark skin areas and cover with foam dressing for absorption and protection and wrap with kerlix and secure with paper tape. Change dressing every 3 days and as needed if soiled.Notify provider increased drainage,redness,swelling or Temp > 100.5.Orders faxed to [place] by [place].   3. Syncope, unspecified syncope type No further episode since ED visit.Appetite has been good.encouraged to increase fluid intake.  Family/ staff Communication: Reviewed plan of care with patient and [name].   Labs/tests ordered: None   Niti Leisure C Aubree Doody, NP

## 2019-01-28 NOTE — Patient Instructions (Signed)
Home health Nurse( family may change dressing in between [facility] visit ) to cleanse right leg wound with saline,pat dry,apply santyl to dark skin areas and cover with foam dressing for absorption and protection and wrap with kerlix and secure with paper tape. Change dressing every 3 days and as needed if soiled.Notify provider increased drainage,redness,swelling or Temp > 100.5.

## 2019-01-30 DIAGNOSIS — E1142 Type 2 diabetes mellitus with diabetic polyneuropathy: Secondary | ICD-10-CM | POA: Diagnosis not present

## 2019-01-30 DIAGNOSIS — F0390 Unspecified dementia without behavioral disturbance: Secondary | ICD-10-CM | POA: Diagnosis not present

## 2019-01-30 DIAGNOSIS — Z7984 Long term (current) use of oral hypoglycemic drugs: Secondary | ICD-10-CM | POA: Diagnosis not present

## 2019-01-30 DIAGNOSIS — I890 Lymphedema, not elsewhere classified: Secondary | ICD-10-CM | POA: Diagnosis not present

## 2019-01-30 DIAGNOSIS — L89153 Pressure ulcer of sacral region, stage 3: Secondary | ICD-10-CM | POA: Diagnosis not present

## 2019-01-30 DIAGNOSIS — I10 Essential (primary) hypertension: Secondary | ICD-10-CM | POA: Diagnosis not present

## 2019-01-31 ENCOUNTER — Other Ambulatory Visit: Payer: Self-pay

## 2019-01-31 ENCOUNTER — Other Ambulatory Visit: Payer: Medicare Other | Admitting: *Deleted

## 2019-01-31 ENCOUNTER — Other Ambulatory Visit: Payer: Medicare Other | Admitting: Licensed Clinical Social Worker

## 2019-01-31 DIAGNOSIS — Z515 Encounter for palliative care: Secondary | ICD-10-CM

## 2019-01-31 NOTE — Progress Notes (Signed)
COMMUNITY PALLIATIVE CARE RN NOTE  PATIENT NAME: [name] DOB: [birth year] MRN: [id number]  PRIMARY CARE PROVIDER: [name], NP  RESPONSIBLE PARTY:  Acct ID - Guarantor Home Phone Work Phone Relationship Acct Type  [id number] PAMI, [phone]  Self P/F     [address], [place], [place] [address]   Due to the COVID-19 crisis, this virtual check-in visit was done via telephone from my office and it was initiated and consent by this patient and or family.  PLAN OF CARE and INTERVENTION 1. ADVANCE CARE PLANNING/GOALS OF CARE: Goal is for patient to remain in the home with her son and daughter-in-law. She has a DNR. 2. PATIENT/CAREGIVER EDUCATION: Pain Management, Safe Transfers 3. DISEASE STATUS:  Joint virtual check-in visit completed via telephone with [place]. Patient denies pain at this time. Her daughter-in-law, [name], states that patient was experiencing some pain from a wound on her R leg. She was seen at her doctor's office regarding this wound. Pus-like drainage coming from wound per [name]. New treatment initiated to apply Santyl to wound bed and change dressing every 3 days. Since the wound has drained, patient does not c/o pain now. She continues with a home health RN for dressing changes and visits are increasing to 2x/week. She also continues with wounds to her sacrum. She was recently seen in the ED on 01/27/19 for a syncope episode and low BP. Patient has had these episodes in the past, but usually starts to "come around" much quicker. She was treated and released back home. Over the past few days, patient has been doing better and seems to be more alert. She is more talkative, but speech remains difficult to understand at times. [name] has started adding some flavor to her water and says she is drinking more. Her intake is good. She says cough and congestion has improved, especially with drinking more water. Her blood sugars have been  stable. She continues to require total care for all ADLs. Will continue to monitor.  HISTORY OF PRESENT ILLNESS:  This is a 83 yo female who resides in the home with her son and daughter-in-law. Palliative care team continues to follow patient. Next visit scheduled 02/28/19 at 11:00 am.    CODE STATUS: DNR ADVANCED DIRECTIVES: Y MOST FORM: no PPS: 30%   (Duration of visit and documentation 60 minutes)   Daryl Eastern, RN BSN

## 2019-01-31 NOTE — Progress Notes (Signed)
COMMUNITY PALLIATIVE CARE SW NOTE  PATIENT NAME: [name] DOB: Aug 15, 1935 MRN: [id number]  PRIMARY CARE PROVIDER: [name], NP  RESPONSIBLE PARTY:  Acct ID - Guarantor Home Phone Work Phone Relationship Acct Type  [id number] JEQUETTA, [id number] [phone]  Self P/F     [address], [place], [place] [address]    Due to the COVID-19 crisis, this virtual check-in visit was done via telephone from my office and it was initiated and consent given by this patient and or family.  PLAN OF CARE and INTERVENTIONS:             1. GOALS OF CARE/ ADVANCE CARE PLANNING:  Goal is for patient to remain in her son's home.  Patient has a DNR. 2. SOCIAL/EMOTIONAL/SPIRITUAL ASSESSMENT/ INTERVENTIONS:  SW and Palliative Care RN, Daryl Eastern, conducted a virtual check in visit with patient's daughter-in-law, and primary caregiver, [name].  Patient has been hospitalized twice this month.  [name] reported it was due to blood pressure issues.  Patient has been drinking more fluids.  She has been more verbal and recognized her granddaughter recently.  SW provided active listening and supportive counseling 3. PATIENT/CAREGIVER EDUCATION/ COPING:  [name] copes by expressing her feelings openly. 4. PERSONAL EMERGENCY PLAN:  Family will contact EMS. 5. COMMUNITY RESOURCES COORDINATION/ HEALTH CARE NAVIGATION:  There are caregivers 24/7. 6. FINANCIAL/LEGAL CONCERNS/INTERVENTIONS:  None.     SOCIAL HX:  Social History   Tobacco Use  . Smoking status: Never Smoker  . Smokeless tobacco: Never Used  Substance Use Topics  . Alcohol use: No    CODE STATUS:  DNR ADVANCED DIRECTIVES:  LW and HCPOA MOST FORM COMPLETE:  No HOSPICE EDUCATION PROVIDED:  No PPS:  Patient's appetite is normal.  Patient is dependent for ADLs. Duration of visit and documentation:  45 minutes.      [name] Maritza Hosterman, LCSW

## 2019-02-07 ENCOUNTER — Other Ambulatory Visit: Payer: Self-pay | Admitting: Family

## 2019-02-07 ENCOUNTER — Telehealth: Payer: Self-pay | Admitting: *Deleted

## 2019-02-07 MED ORDER — SANTYL 250 UNIT/GM EX OINT: TOPICAL_OINTMENT | CUTANEOUS | 0 refills | Status: DC

## 2019-02-07 NOTE — Telephone Encounter (Signed)
Moe with [facility] called and stated that patient needs a Rx for Santyl sent to Pharmacy.   Pended Rx and sent to [facility] for approval.

## 2019-02-24 DIAGNOSIS — Z8673 Personal history of transient ischemic attack (TIA), and cerebral infarction without residual deficits: Secondary | ICD-10-CM | POA: Diagnosis not present

## 2019-02-24 DIAGNOSIS — I10 Essential (primary) hypertension: Secondary | ICD-10-CM

## 2019-02-24 DIAGNOSIS — Z86718 Personal history of other venous thrombosis and embolism: Secondary | ICD-10-CM

## 2019-02-24 DIAGNOSIS — Z7984 Long term (current) use of oral hypoglycemic drugs: Secondary | ICD-10-CM | POA: Diagnosis not present

## 2019-02-24 DIAGNOSIS — I890 Lymphedema, not elsewhere classified: Secondary | ICD-10-CM

## 2019-02-24 DIAGNOSIS — R1312 Dysphagia, oropharyngeal phase: Secondary | ICD-10-CM | POA: Diagnosis not present

## 2019-02-24 DIAGNOSIS — Z7901 Long term (current) use of anticoagulants: Secondary | ICD-10-CM | POA: Diagnosis not present

## 2019-02-24 DIAGNOSIS — F0390 Unspecified dementia without behavioral disturbance: Secondary | ICD-10-CM | POA: Diagnosis not present

## 2019-02-24 DIAGNOSIS — E1142 Type 2 diabetes mellitus with diabetic polyneuropathy: Secondary | ICD-10-CM | POA: Diagnosis not present

## 2019-02-24 DIAGNOSIS — Z86711 Personal history of pulmonary embolism: Secondary | ICD-10-CM

## 2019-02-24 DIAGNOSIS — L89153 Pressure ulcer of sacral region, stage 3: Secondary | ICD-10-CM | POA: Diagnosis not present

## 2019-02-25 ENCOUNTER — Ambulatory Visit (INDEPENDENT_AMBULATORY_CARE_PROVIDER_SITE_OTHER): Payer: Medicare Other | Admitting: Nurse Practitioner

## 2019-02-25 ENCOUNTER — Encounter: Payer: Self-pay | Admitting: Nurse Practitioner

## 2019-02-25 ENCOUNTER — Other Ambulatory Visit: Payer: Self-pay

## 2019-02-25 VITALS — BP 130/78 | HR 90 | Temp 98.4°F

## 2019-02-25 DIAGNOSIS — E1142 Type 2 diabetes mellitus with diabetic polyneuropathy: Secondary | ICD-10-CM | POA: Diagnosis not present

## 2019-02-25 DIAGNOSIS — I10 Essential (primary) hypertension: Secondary | ICD-10-CM | POA: Diagnosis not present

## 2019-02-25 DIAGNOSIS — S81801D Unspecified open wound, right lower leg, subsequent encounter: Secondary | ICD-10-CM | POA: Diagnosis not present

## 2019-02-25 DIAGNOSIS — D649 Anemia, unspecified: Secondary | ICD-10-CM | POA: Diagnosis not present

## 2019-02-25 DIAGNOSIS — F0390 Unspecified dementia without behavioral disturbance: Secondary | ICD-10-CM | POA: Diagnosis not present

## 2019-02-25 NOTE — Progress Notes (Signed)
Careteam: Patient Care Team: Carolyn Chandler, NP as PCP - General (Geriatric Medicine) Carolyn Napoleon, MD as Consulting Physician (Oncology) [name], Carolyn Corn, LCSW as Social Worker (Licensed Clinical Social Worker) Carolyn Bowens, RN as Registered Nurse [facility] and [place])  Advanced Directive information    No Known Allergies  Chief Complaint  Patient presents with  . Medical Management of Chronic Issues    4 month follow-up and wound check   . Quality Metric Gaps    Patient will schedule eye exam in the near future. Foot exam due      HPI: Patient is a 83 y.o. female seen in the office today for a 4 month follow up and wound check. Daughter says over weekend pt has had mild congested cough, runny nose with cough, but no fever, no SOB, no other symptoms and today symptoms have improved.  Diabetes with neuropathy- Checks blood sugar once daily. Blood sugar running high for last two days, fasting 190s, normally 120. Daughter gave extra dose of metformin this morning (1000mg ), usually takes 500mg  morning and night. She will occasionally give additional dose which brings blood sugars down to goal.  Taking gabapentin 300 mg twice daily which controls neuropathy.  Hypertension- stable at home- Checks blood pressure daily, sys normally 150-160 after medications.Taking losartan and clonidine, daughter has not had to give extra dose of clonidine for SBP >170.   Anemia- RBC 3.58, Hgb 10.4 (01/27/19) during ED visit. Taking Xarelto daily, no bleeding.   Right leg wound- Had I&D in ED 11/02/18. Incision healed well according to daughter, but different spot of wound opened up about 1 month ago. Using santyl to dark areas around wound, open area dressed with foam and kerlix. Dressing changed every three days by daughter or home health nurse. Does not seem to be painful, no redness or swelling. No change in drainage, no odor.   Review of Systems:  Review of Systems  Unable  to perform ROS: Dementia (information provided by daughter)  Constitutional: Negative for fever and weight loss.  Respiratory: Positive for cough. Negative for sputum production, shortness of breath and wheezing.   Cardiovascular: Positive for leg swelling. Negative for chest pain.  Gastrointestinal: Positive for constipation (controlled). Negative for diarrhea, nausea and vomiting.  Musculoskeletal: Positive for joint pain (controlled). Negative for falls.  Endo/Heme/Allergies: Does not bruise/bleed easily.    Past Medical History:  Diagnosis Date  . Dementia ([place])   . Diabetes mellitus type II 03/21/2011  . DVT (deep venous thrombosis) ([place]) 03/21/2011  . Hyperlipidemia 03/21/2011  . Hypertension 03/21/2011  . Lymphedema of leg 03/21/2011  . Pulmonary embolism ([place]) 03/21/2011  . Stroke [facility])    Past Surgical History:  Procedure Laterality Date  . CHOLECYSTECTOMY  2015  . SKIN TAG REMOVAL  07/25/2016  . VAGINAL HYSTERECTOMY     unknown of date  . VASCULAR SURGERY     Social History:   reports that she has never smoked. She has never used smokeless tobacco. She reports that she does not drink alcohol or use drugs.  Family History  Problem Relation Age of Onset  . Heart attack Mother   . COPD Sister   . Stroke Sister   . Bipolar disorder Daughter     Medications: Patient's Medications  New Prescriptions   No medications on file  Previous Medications   ACCU-CHEK SOFTCLIX LANCETS LANCETS    1 each by Other route daily. Use as instructed Dx: E11.9   ACETAMINOPHEN (  TYLENOL) 500 MG TABLET    Take 500 mg by mouth as needed (general pain).    ATORVASTATIN (LIPITOR) 20 MG TABLET    Take 1 tablet (20 mg total) by mouth daily at 6 PM.   CHOLECALCIFEROL (VITAMIN D-3) 125 MCG (5000 UT) TABS    Take 5,000 Units by mouth daily.   CLONIDINE (CATAPRES) 0.1 MG TABLET    Take 0.1 mg by mouth 2 (two) times daily. May take an additional tablet if needed SBP >170   COLLAGENASE (SANTYL)  OINTMENT    Apply to dark skin area every 3 days   DICLOFENAC SODIUM (VOLTAREN) 1 % GEL    Apply 1 application topically 2 (two) times daily as needed (pain).    GABAPENTIN (NEURONTIN) 300 MG CAPSULE    Take 1 capsule (300 mg total) by mouth 2 (two) times daily.   GLUCOSE BLOOD (ACCU-CHEK AVIVA PLUS) TEST STRIP    1 each by Other route 2 (two) times daily. E11.9   LOSARTAN (COZAAR) 25 MG TABLET    Take 1 tablet (25 mg total) by mouth daily. Take along with 50 mg to equal 75   LOSARTAN (COZAAR) 50 MG TABLET    Take 1 tablet (50 mg total) by mouth daily. Take 1 along with 25 mg to equal 75 mg total   METFORMIN (GLUCOPHAGE) 500 MG TABLET    1-2 by mouth in the am depending on blood sugar reading, 1-2 by mouth in the evening depending on blood sugar reading   POLYETHYLENE GLYCOL (MIRALAX / GLYCOLAX) PACKET    Take 17 g by mouth daily.    PROBIOTIC PRODUCT (PROBIOTIC PO)    Take 1 capsule by mouth daily.   RIVAROXABAN (XARELTO) 15 MG TABS TABLET    Take 1 tablet (15 mg total) by mouth daily with supper.   VITAMINS A & D (VITAMIN A & D) OINTMENT    Apply 1 application topically as needed for dry skin.   WOUND DRESSINGS (MEPILEX EX)    Apply to sacrum and change every day  as needed  Modified Medications   No medications on file  Discontinued Medications   CLONIDINE (CATAPRES) 0.1 MG TABLET    Take 1 tablet (0.1 mg total) by mouth every 8 (eight) hours. May take an additional tablet if SBP >170   METFORMIN (GLUCOPHAGE) 500 MG TABLET    1000 mg by mouth twice daily with breakfast and Dinner    Physical Exam:  There were no vitals filed for this visit. There is no height or weight on file to calculate BMI. Wt Readings from Last 3 Encounters:  11/02/18 130 lb (59 kg)  01/30/18 130 lb (59 kg)  08/07/17 130 lb (59 kg)    Physical Exam Constitutional:      Appearance: Normal appearance. She is not ill-appearing.  HENT:     Head: Normocephalic.  Neck:     Musculoskeletal: No pain with movement.      Thyroid: No thyroid mass.  Cardiovascular:     Rate and Rhythm: Normal rate and regular rhythm.     Heart sounds: Normal heart sounds.  Pulmonary:     Effort: Pulmonary effort is normal.     Breath sounds: Normal breath sounds. No wheezing or rhonchi.  Abdominal:     General: Bowel sounds are normal. There is no distension.     Palpations: Abdomen is soft.  Musculoskeletal:     Right lower leg: 2+ Edema present.     Left  lower leg: 2+ Edema present.     Comments: Wheelchair bound  Feet:     Comments: Microfilament attempted, pt nonparticipatory Skin:    General: Skin is warm and dry.     Findings: Wound present.          Comments: Round open area measures 1.5x2 cm, clear drainage, beefy red with yellow center. Skin surrounding wound is darkened but dry and intact. No heat, no signs of infection.      Labs reviewed: Basic Metabolic Panel: Recent Labs    11/02/18 1445 01/11/19 2004 01/27/19 1636  NA 138 139 140  K 4.6 3.9 4.0  CL 100 101 100  CO2 27 25 25   GLUCOSE 181* 198* 179*  BUN 15 23 20   CREATININE 0.36* 0.49 0.43*  CALCIUM 9.1 9.4 9.3   Liver Function Tests: Recent Labs    09/03/18 1143 10/29/18 1034 01/11/19 2004 01/27/19 1636  AST 11* 11 16 17   ALT 9 9 14 12   ALKPHOS 71  --  76 65  BILITOT 0.4 0.4 0.6 0.1*  PROT 7.5 6.3 7.5 6.0*  ALBUMIN 4.1  --  3.4* 3.0*   No results for input(s): LIPASE, AMYLASE in the last 8760 hours. No results for input(s): AMMONIA in the last 8760 hours. CBC: Recent Labs    11/02/18 1445 01/11/19 2004 01/27/19 1636  WBC 6.5 6.5 4.0  NEUTROABS 4.4 4.0 2.8  HGB 11.8* 12.3 10.4*  HCT 38.3 40.1 34.1*  MCV 95.8 95.0 95.3  PLT 212 283 245   Lipid Panel: Recent Labs    09/03/18 1143  CHOL 173  HDL 52  LDLCALC 103*  TRIG 90  CHOLHDL 3.3   TSH: No results for input(s): TSH in the last 8760 hours. A1C: Lab Results  Component Value Date   HGBA1C 8.2 (H) 09/03/2018     Assessment/Plan 1. Essential  hypertension -Blood pressure stable, continue to monitor at home -Continue Losartan 75mg  daily and clonidine 0.1mg  twice daily with instructions to take additional dose of clonidine if SBP [id number] - BASIC METABOLIC PANEL WITH GFR  2. Wound of right leg, subsequent encounter -Chronic wound, ongoing since June. Initially improved after I&D but now open for about a month. Refer to wound care center due to slow/nonhealing.  -Continue dressing changes every three days per nursing.  -Discussed good protein intake to promote wound healing -Daughter will call office if pt develops signs of infection or worsening of wound.  - CBC with Differential/Platelet - AMB referral to wound care center  3. Type 2 diabetes mellitus with diabetic polyneuropathy, without long-term current use of insulin (HCC) -A1c 8.2 (09/03/18), recheck today -Continue blood sugar checks daily -Continue metformin 500 mg in AM and PM - giving additional dose if elevated blood sugar -Discussed good blood sugar control and role in wound healing - Hemoglobin A1c  4. Dementia without behavioral disturbance, unspecified dementia type ([place]) -Advanced dementia requiring total care by family.  -24 hour care provided by daughter & aid  5. Anemia, unspecified type -RBC 3.58, Hgb 10.4 (01/27/19) during ED visit and prior hgb 12.3, no signs of bleeding will Recheck labs today -no signs of bleeding to monitor for signs of bleeding, dark tarry stools - CBC with Differential/Platelet   Next appt: 4 months for routine follow up [place]. Harle Battiest  I personally was present during the history, physical exam and medical decision-making activities of this service and have verified that the service and findings are accurately documented in the  student's note   [facility] [phone]

## 2019-02-26 ENCOUNTER — Other Ambulatory Visit: Payer: Self-pay

## 2019-02-26 LAB — CBC WITH DIFFERENTIAL/PLATELET
Absolute Monocytes: 390 cells/uL (ref 200–950)
Basophils Absolute: 21 cells/uL (ref 0–200)
Basophils Relative: 0.5 %
Eosinophils Absolute: 21 cells/uL (ref 15–500)
Eosinophils Relative: 0.5 %
HCT: 33.6 % — ABNORMAL LOW (ref 35.0–45.0)
Hemoglobin: 10.8 g/dL — ABNORMAL LOW (ref 11.7–15.5)
Lymphs Abs: 1328 cells/uL (ref 850–3900)
MCH: 29.3 pg (ref 27.0–33.0)
MCHC: 32.1 g/dL (ref 32.0–36.0)
MCV: 91.3 fL (ref 80.0–100.0)
MPV: 9.4 fL (ref 7.5–12.5)
Monocytes Relative: 9.5 %
Neutro Abs: 2341 cells/uL (ref 1500–7800)
Neutrophils Relative %: 57.1 %
Platelets: 207 10*3/uL (ref 140–400)
RBC: 3.68 10*6/uL — ABNORMAL LOW (ref 3.80–5.10)
RDW: 12.1 % (ref 11.0–15.0)
Total Lymphocyte: 32.4 %
WBC: 4.1 10*3/uL (ref 3.8–10.8)

## 2019-02-26 LAB — BASIC METABOLIC PANEL WITH GFR
BUN/Creatinine Ratio: 63 (calc) — ABNORMAL HIGH (ref 6–22)
BUN: 27 mg/dL — ABNORMAL HIGH (ref 7–25)
CO2: 26 mmol/L (ref 20–32)
Calcium: 9.3 mg/dL (ref 8.6–10.4)
Chloride: 103 mmol/L (ref 98–110)
Creat: 0.43 mg/dL — ABNORMAL LOW (ref 0.60–0.88)
GFR, Est African American: 111 mL/min/{1.73_m2} (ref >=60)
GFR, Est Non African American: 95 mL/min/{1.73_m2} (ref >=60)
Glucose, Bld: 280 mg/dL — ABNORMAL HIGH (ref 65–139)
Potassium: 4.6 mmol/L (ref 3.5–5.3)
Sodium: 140 mmol/L (ref 135–146)

## 2019-02-26 LAB — HEMOGLOBIN A1C
Hgb A1c MFr Bld: 7.9 % of total Hgb — ABNORMAL HIGH (ref <5.7)
Mean Plasma Glucose: 180 (calc)
eAG (mmol/L): 10 (calc)

## 2019-02-26 MED ORDER — LOSARTAN POTASSIUM 50 MG PO TABS: 50.0000 mg | ORAL_TABLET | Freq: Every day | ORAL | 1 refills | Status: DC

## 2019-02-26 MED ORDER — LOSARTAN POTASSIUM 25 MG PO TABS: 25.0000 mg | ORAL_TABLET | Freq: Every day | ORAL | 1 refills | Status: DC

## 2019-02-26 MED ORDER — METFORMIN HCL 500 MG PO TABS: ORAL_TABLET | ORAL | 1 refills | Status: DC

## 2019-02-27 ENCOUNTER — Ambulatory Visit (INDEPENDENT_AMBULATORY_CARE_PROVIDER_SITE_OTHER): Payer: Medicare Other | Admitting: Family

## 2019-02-27 ENCOUNTER — Other Ambulatory Visit: Payer: Self-pay

## 2019-02-27 ENCOUNTER — Encounter: Payer: Self-pay | Admitting: Family

## 2019-02-27 VITALS — BP 182/92 | HR 98 | Temp 97.3°F | Resp 16

## 2019-02-27 DIAGNOSIS — I890 Lymphedema, not elsewhere classified: Secondary | ICD-10-CM | POA: Diagnosis not present

## 2019-02-27 DIAGNOSIS — I10 Essential (primary) hypertension: Secondary | ICD-10-CM

## 2019-02-27 DIAGNOSIS — L89153 Pressure ulcer of sacral region, stage 3: Secondary | ICD-10-CM | POA: Diagnosis not present

## 2019-02-27 DIAGNOSIS — Z7984 Long term (current) use of oral hypoglycemic drugs: Secondary | ICD-10-CM | POA: Diagnosis not present

## 2019-02-27 DIAGNOSIS — R05 Cough: Secondary | ICD-10-CM | POA: Diagnosis not present

## 2019-02-27 DIAGNOSIS — F0390 Unspecified dementia without behavioral disturbance: Secondary | ICD-10-CM | POA: Diagnosis not present

## 2019-02-27 DIAGNOSIS — E1142 Type 2 diabetes mellitus with diabetic polyneuropathy: Secondary | ICD-10-CM | POA: Diagnosis not present

## 2019-02-27 DIAGNOSIS — R058 Other specified cough: Secondary | ICD-10-CM

## 2019-02-27 MED ORDER — UNABLE TO FIND: 0 refills | Status: DC

## 2019-02-27 NOTE — Progress Notes (Signed)
This service is provided via telemedicine  No vital signs collected/recorded due to the encounter was a telemedicine visit.   Location of patient (ex: home, work):  Home   Patient consents to a telephone visit:  Yes  Location of the provider (ex: office, home):  Office   Name of any referring provider: [name], NP   Names of all persons participating in the telemedicine service and their role in the encounter:  Marlowe Sax, NP, Ruthell Rummage CMA, and Thomasenia Bottoms and daughter in Scientist, forensic   Time spent on call: Ruthell Rummage CMA spent 6  minutes on phone with patient.     [place] [facility]  Provider: Marlowe Sax, NP   Code Status: FULL Goals of Care:  Advanced Directives 11/02/2018  Does Patient Have a Medical Advance Directive? Yes  Type of Paramedic of [place];Living will  Does patient want to make changes to medical advance directive? -  Copy of [place] in Chart? -     Chief Complaint  Patient presents with   Acute Visit    Home Health Nurse states patient is having crackles in lungs,cough, thick white mucus and runny nose, daughter in law mentioned in monday's appointment  states symptoms for a couple of weeks and usually comes and goes. States it happens around breakfast     HPI: Patient is a 83 y.o. female seen today for an acute visit for evaluation of cough.Patient's daughter in law  provides the HPI information due to patient's cognitive impairment.she states patient has been coughing during meals.she coughs up food and thick white mucus.Also has clear runny nose whenever she coughs.she was seen by [facility] Nurse states patient is having crackles in lungs,cough.[name] states mentioned cough symptoms on patient's monday's appointment with PCP [name] NP.she states has had symptoms for a couple of weeks and usually comes and goes.No fever,chills,shortness of breath or wheezing noted.Her blood  pressure checked earlier by [facility] was 182/92.[name] states patient had just taken her blood pressure medication.she rechecked blood pressure during visit with provider states B/p down to 141/89   Past Medical History:  Diagnosis Date   Dementia ([place])    Diabetes mellitus type II 03/21/2011   DVT (deep venous thrombosis) ([place]) 03/21/2011   Hyperlipidemia 03/21/2011   Hypertension 03/21/2011   Lymphedema of leg 03/21/2011   Pulmonary embolism ([place]) 03/21/2011   Stroke [facility])     Past Surgical History:  Procedure Laterality Date   CHOLECYSTECTOMY  2015   SKIN TAG REMOVAL  07/25/2016   VAGINAL HYSTERECTOMY     unknown of date   VASCULAR SURGERY      No Known Allergies  Outpatient Encounter Medications as of 02/27/2019  Medication Sig   ACCU-CHEK SOFTCLIX LANCETS lancets 1 each by Other route daily. Use as instructed Dx: E11.9   acetaminophen (TYLENOL) 500 MG tablet Take 500 mg by mouth as needed (general pain).    atorvastatin (LIPITOR) 20 MG tablet Take 1 tablet (20 mg total) by mouth daily at 6 PM.   Cholecalciferol (VITAMIN D-3) 125 MCG (5000 UT) TABS Take 5,000 Units by mouth daily.   cloNIDine (CATAPRES) 0.1 MG tablet Take 0.1 mg by mouth 2 (two) times daily. May take an additional tablet if needed SBP >170   collagenase (SANTYL) ointment Apply to dark skin area every 3 days   diclofenac sodium (VOLTAREN) 1 % GEL Apply 1 application topically 2 (two) times daily as needed (pain).  gabapentin (NEURONTIN) 300 MG capsule Take 1 capsule (300 mg total) by mouth 2 (two) times daily.   glucose blood (ACCU-CHEK AVIVA PLUS) test strip 1 each by Other route 2 (two) times daily. E11.9   losartan (COZAAR) 25 MG tablet Take 1 tablet (25 mg total) by mouth daily. Take along with 50 mg to equal 75   losartan (COZAAR) 50 MG tablet Take 1 tablet (50 mg total) by mouth daily. Take 1 along with 25 mg to equal 75 mg total   metFORMIN (GLUCOPHAGE) 500 MG tablet Take  1000 mg (2 tabs) by mouth in the am, and take 1 tab (500 mg)   by mouth in the evening.   polyethylene glycol (MIRALAX / GLYCOLAX) packet Take 17 g by mouth daily.    Probiotic Product (PROBIOTIC PO) Take 1 capsule by mouth daily.   Rivaroxaban (XARELTO) 15 MG TABS tablet Take 1 tablet (15 mg total) by mouth daily with supper.   Vitamins A & D (VITAMIN A & D) ointment Apply 1 application topically as needed for dry skin.   Wound Dressings (MEPILEX EX) Apply to sacrum and change every day  as needed   No facility-administered encounter medications on file as of 02/27/2019.     Review of Systems:  Review of Systems  Constitutional: Negative for appetite change, chills, fatigue and fever.  HENT: Positive for rhinorrhea. Negative for congestion, sinus pressure, sinus pain, sneezing and sore throat.   Respiratory: Positive for cough. Negative for chest tightness, shortness of breath and wheezing.   Cardiovascular: Negative for chest pain, palpitations and leg swelling.  Gastrointestinal: Negative for abdominal distention, abdominal pain, constipation, diarrhea, nausea and vomiting.  Skin: Negative for color change, pallor and rash.  Psychiatric/Behavioral: Positive for confusion. The patient is not nervous/anxious.     Health Maintenance  Topic Date Due   FOOT EXAM  08/08/1945   OPHTHALMOLOGY EXAM  01/24/2018   TETANUS/TDAP  05/10/2019 (Originally 08/07/1954)   PNA vac Low Risk Adult (1 of 2 - PCV13) 08/21/2019 (Originally 08/08/2002)   HEMOGLOBIN A1C  08/26/2019   DEXA SCAN  Completed   INFLUENZA VACCINE  Discontinued    Physical Exam: Vitals:   02/27/19 1351  BP: (!) 182/92  Pulse: 98  Resp: 16  Temp: (!) 97.3 F (36.3 C)  TempSrc: Oral  SpO2: 98%   There is no height or weight on file to calculate BMI. Physical Exam  Unable to complete on telephone visit.   Labs reviewed: Basic Metabolic Panel: Recent Labs    01/11/19 2004 01/27/19 1636 02/25/19 1518  NA  139 140 140  K 3.9 4.0 4.6  CL 101 100 103  CO2 25 25 26   GLUCOSE 198* 179* 280*  BUN 23 20 27*  CREATININE 0.49 0.43* 0.43*  CALCIUM 9.4 9.3 9.3   Liver Function Tests: Recent Labs    09/03/18 1143 10/29/18 1034 01/11/19 2004 01/27/19 1636  AST 11* 11 16 17   ALT 9 9 14 12   ALKPHOS 71  --  76 65  BILITOT 0.4 0.4 0.6 0.1*  PROT 7.5 6.3 7.5 6.0*  ALBUMIN 4.1  --  3.4* 3.0*   CBC: Recent Labs    01/11/19 2004 01/27/19 1636 02/25/19 1518  WBC 6.5 4.0 4.1  NEUTROABS 4.0 2.8 2,341  HGB 12.3 10.4* 10.8*  HCT 40.1 34.1* 33.6*  MCV 95.0 95.3 91.3  PLT 283 245 207   Lipid Panel: Recent Labs    09/03/18 1143  CHOL 173  HDL  25  LDLCALC 103*  TRIG 90  CHOLHDL 3.3   Lab Results  Component Value Date   HGBA1C 7.9 (H) 02/25/2019    Procedures since last visit: No results found.  Assessment/Plan  1. Nonproductive cough Afebrile.cough worst during meals.lungs crackles reported by [facility] - discussed aspiration precaution with daughter in law.Has worked with speech therapist in the past daughter in law will try to follow speech therapist guidelines then notify provider if ST or swallow evaluation required.Portable CXR requested order to be faxed to [place]; Med Name: Portable Chest X-ray 2 views to rule Aspiration Pneumonia due to cough during meals  Dispense: 1 Act; Refill: 0  2. Essential hypertension B/p readings high when checked earlier by [facility] but had just taken her medication.B/p rechecked 141/89.continue current medication.   Labs/tests ordered:  Portable Chest X-ray 2 views to rule Aspiration Pneumonia due to cough during meals.Order faxed to [facility] by CMA Ruthell Rummage.   Spent 12 minutes of non-face to face with patient

## 2019-02-28 ENCOUNTER — Other Ambulatory Visit: Payer: Self-pay

## 2019-02-28 ENCOUNTER — Other Ambulatory Visit: Payer: Medicare Other | Admitting: *Deleted

## 2019-02-28 ENCOUNTER — Telehealth: Payer: Self-pay

## 2019-02-28 DIAGNOSIS — Z515 Encounter for palliative care: Secondary | ICD-10-CM

## 2019-02-28 DIAGNOSIS — E1142 Type 2 diabetes mellitus with diabetic polyneuropathy: Secondary | ICD-10-CM

## 2019-02-28 DIAGNOSIS — R05 Cough: Secondary | ICD-10-CM | POA: Diagnosis not present

## 2019-02-28 MED ORDER — ACCU-CHEK AVIVA PLUS VI STRP: 1.0000 | ORAL_STRIP | Freq: Two times a day (BID) | 3 refills | Status: DC

## 2019-02-28 MED ORDER — ACCU-CHEK SOFTCLIX LANCETS MISC: 1.0000 | Freq: Two times a day (BID) | 3 refills | Status: DC

## 2019-02-28 NOTE — Telephone Encounter (Signed)
Message left on clinical intake voicemail:   [name] called requesting refill on DM testing supplies for twice daily.  RX's sent as requested  Left detailed message informing [name] message received and fulfilled

## 2019-03-04 NOTE — Progress Notes (Signed)
COMMUNITY PALLIATIVE CARE RN NOTE  PATIENT NAME: [name] DOB: [birth year] MRN: [id number]  PRIMARY CARE PROVIDER: [name], NP  RESPONSIBLE PARTY:  Acct ID - Guarantor Home Phone Work Phone Relationship Acct Type  [id number] [phone]  Self P/F     [address] [place], [place] [address]   Due to the COVID-19 crisis, this virtual check-in visit was done via telephone from my office and it was initiated and consent by this patient and or family.  PLAN OF CARE and INTERVENTION:  1. ADVANCE CARE PLANNING/GOALS OF CARE: Goal is for patient to remain at home with her family. She has a DNR. 2. PATIENT/CAREGIVER EDUCATION: Safe Transfers, Medication Management 3. DISEASE STATUS: Virtual check-in visit completed via telephone. Patient denies pain at this time. [name], [name] says that patient is having a portable chest x-ray done today. She has started coughing again in the mornings upon awakening and during breakfast. Cough is productive with thick, white sputum. Patient is afebrile, but does have a runny nose. She had a virtual visit with her PCPs office on 02/27/19. A chest x-ray was ordered after patient had a visit with her home health RN through [facility], who heard some adventitious sounds in her lungs during ausculation. No coughing noted per [name] during other meals. Her appetite has been good along with her fluid intake. Her blood sugars are variable and elevated at times. This am it was 202. She is currently taking Metformin 1000 mg in the am and 500 mg in the pm. If blood sugars are elevated at bedtime they will give 1000 mg vs 500 mg. She continues with fluctuations in her BP, however seems to be managed with current medications. She takes Losartan in the am along with Clonidine. She will take a Clonidine mid-day if her BP is elevated and takes a tablet at night. Her bowels have been more regular lately taking Miralax. She continues with a wound to her  left leg. It is no longer causing pain, however is not healing. She has an appointment with the [facility] on 03/13/19 to assess. Hired caregivers have increased to 7 days per week from 9a-3p and 3p-8p. Will continue to monitor.  HISTORY OF PRESENT ILLNESS:  This is a 83 yo female who resides in the home with her son and daughter in law. Palliative care team continues to follow patient. Next visit scheduled in 1 month.   CODE STATUS: DNR ADVANCED DIRECTIVES: Y MOST FORM: no PPS: 30%   (Duration of visit and documentation 60 minutes)   Daryl Eastern, RN BSN

## 2019-03-11 ENCOUNTER — Telehealth: Payer: Self-pay | Admitting: *Deleted

## 2019-03-11 DIAGNOSIS — R531 Weakness: Secondary | ICD-10-CM

## 2019-03-11 DIAGNOSIS — Z7409 Other reduced mobility: Secondary | ICD-10-CM

## 2019-03-11 NOTE — Telephone Encounter (Signed)
This is okay, she needs with her weakness and lack of mobility.

## 2019-03-11 NOTE — Telephone Encounter (Signed)
Patient daughter in law, [name] called and left message on Clinical Intake stating that she would like to get a Rx for a [employer] to help in lifting patient.  Stated that she, [name], will be having shoulder surgery next month and will be unable to help lift her. Please place order.   Please Advise.

## 2019-03-11 NOTE — Telephone Encounter (Signed)
Clarification: Wants Rx for [employer] with U Shaped Sling.

## 2019-03-12 ENCOUNTER — Telehealth: Payer: Self-pay | Admitting: Family

## 2019-03-12 NOTE — Telephone Encounter (Signed)
Chest X-ray results shows no signs of aspiration pneumonia.

## 2019-03-12 NOTE — Telephone Encounter (Signed)
Order printed and placed in [place] for signature.  Awaiting [name] to call back with who to fax order to.

## 2019-03-13 ENCOUNTER — Other Ambulatory Visit: Payer: Self-pay

## 2019-03-13 ENCOUNTER — Encounter (HOSPITAL_BASED_OUTPATIENT_CLINIC_OR_DEPARTMENT_OTHER): Payer: Medicare Other | Attending: Physician Assistant | Admitting: Physician Assistant

## 2019-03-13 DIAGNOSIS — I890 Lymphedema, not elsewhere classified: Secondary | ICD-10-CM | POA: Diagnosis not present

## 2019-03-13 DIAGNOSIS — F0390 Unspecified dementia without behavioral disturbance: Secondary | ICD-10-CM | POA: Diagnosis not present

## 2019-03-13 DIAGNOSIS — I10 Essential (primary) hypertension: Secondary | ICD-10-CM | POA: Insufficient documentation

## 2019-03-13 DIAGNOSIS — L97812 Non-pressure chronic ulcer of other part of right lower leg with fat layer exposed: Secondary | ICD-10-CM | POA: Diagnosis not present

## 2019-03-13 DIAGNOSIS — E11622 Type 2 diabetes mellitus with other skin ulcer: Secondary | ICD-10-CM | POA: Diagnosis not present

## 2019-03-13 DIAGNOSIS — T8189XA Other complications of procedures, not elsewhere classified, initial encounter: Secondary | ICD-10-CM | POA: Diagnosis not present

## 2019-03-13 NOTE — Telephone Encounter (Signed)
Tried to call patient's daughter in law. No answer. LMOM for her to call office.

## 2019-03-13 NOTE — Telephone Encounter (Signed)
Called and spoke with [name]. She will call call back with a number to send Rx to. Awaiting callback.

## 2019-03-13 NOTE — Telephone Encounter (Signed)
Called and spoke with [name]. Relayed results of patient's chest xray. She verbalized understanding and denied further questions.

## 2019-03-14 NOTE — Telephone Encounter (Signed)
[name] called with number to fax order to [facility] Fax: [phone] Order faxed. Caregiver aware.

## 2019-03-19 ENCOUNTER — Other Ambulatory Visit: Payer: Self-pay

## 2019-03-19 ENCOUNTER — Other Ambulatory Visit: Payer: Self-pay | Admitting: *Deleted

## 2019-03-19 ENCOUNTER — Ambulatory Visit
Admission: RE | Admit: 2019-03-19 | Discharge: 2019-03-19 | Disposition: A | Discharge status: Home / Self Care | Payer: Medicare Other | Source: Ambulatory Visit | Attending: Physician Assistant | Admitting: Physician Assistant

## 2019-03-19 DIAGNOSIS — M1711 Unilateral primary osteoarthritis, right knee: Secondary | ICD-10-CM | POA: Diagnosis not present

## 2019-03-19 DIAGNOSIS — L97919 Non-pressure chronic ulcer of unspecified part of right lower leg with unspecified severity: Secondary | ICD-10-CM

## 2019-03-19 DIAGNOSIS — M7989 Other specified soft tissue disorders: Secondary | ICD-10-CM | POA: Diagnosis not present

## 2019-03-19 NOTE — Telephone Encounter (Signed)
[name] called and stated that [facility] did not have the [employer] with U shaped sling. Wants [place] to fax the order to [facility]. Faxed.

## 2019-03-20 ENCOUNTER — Other Ambulatory Visit: Payer: Self-pay

## 2019-03-20 ENCOUNTER — Encounter (HOSPITAL_BASED_OUTPATIENT_CLINIC_OR_DEPARTMENT_OTHER): Payer: Medicare Other | Attending: Physician Assistant | Admitting: Physician Assistant

## 2019-03-20 DIAGNOSIS — L97812 Non-pressure chronic ulcer of other part of right lower leg with fat layer exposed: Secondary | ICD-10-CM | POA: Diagnosis not present

## 2019-03-20 DIAGNOSIS — F0390 Unspecified dementia without behavioral disturbance: Secondary | ICD-10-CM | POA: Diagnosis not present

## 2019-03-20 DIAGNOSIS — E11622 Type 2 diabetes mellitus with other skin ulcer: Secondary | ICD-10-CM | POA: Diagnosis not present

## 2019-03-20 DIAGNOSIS — I890 Lymphedema, not elsewhere classified: Secondary | ICD-10-CM | POA: Insufficient documentation

## 2019-03-20 DIAGNOSIS — Z7984 Long term (current) use of oral hypoglycemic drugs: Secondary | ICD-10-CM | POA: Diagnosis not present

## 2019-03-20 DIAGNOSIS — I10 Essential (primary) hypertension: Secondary | ICD-10-CM | POA: Diagnosis not present

## 2019-03-20 NOTE — Progress Notes (Signed)
ZAIDY, ZINNO ([id number]) Visit Report for 03/20/2019 Chief Complaint Document Details Patient Name: Date of Service: PIERA, BALAM 03/20/2019 10:45 AM Medical Record [id number] Patient Account Number: [id number] Date of Birth/Sex: Treating RN: 1935-08-31 (83 y.o. [name] Primary Care Provider: [name] Other Clinician: Referring Provider: Treating Provider/Extender:Stone III, Mardi Mainland, Loni Muse in Treatment: 1 Information Obtained from: Patient Chief Complaint Right LE Ulcer Electronic Signature(s) Signed: 03/20/2019 5:02:14 PM By: Worthy Keeler PA-C Entered By: Worthy Keeler on 03/20/2019 11:04:06 -------------------------------------------------------------------------------- Debridement Details Patient Name: Date of Service: DELLIA, RUNCK 03/20/2019 10:45 AM Medical Record [id number] Patient Account Number: [id number] Date of Birth/Sex: 1935-09-12 (83 y.o. F) Treating RN: Baruch Gouty Primary Care Provider: [name] Other Clinician: Referring Provider: Treating Provider/Extender:Stone III, Mardi Mainland, Loni Muse in Treatment: 1 Debridement Performed for Wound #1 Right,Lateral Knee Assessment: Performed By: Physician Worthy Keeler, PA Debridement Type: Debridement Severity of Tissue Pre Fat layer exposed Debridement: Level of Consciousness (Pre- Awake and Alert procedure): Pre-procedure Verification/Time Out Taken: Yes - 11:25 Start Time: 11:28 Pain Control: Lidocaine 4% Topical Solution Total Area Debrided (L x W): 0.8 (cm) x 0.7 (cm) = 0.56 (cm) Tissue and other material Viable, Non-Viable, Slough, Subcutaneous, Slough debrided: Level: Skin/Subcutaneous Tissue Debridement Description: Excisional Instrument: Curette Bleeding: Minimum Hemostasis Achieved: Pressure End Time: 11:29 Procedural Pain: 3 Post Procedural Pain: 1 Response to Treatment: Procedure was tolerated well Level of Consciousness  Awake and Alert (Post-procedure): Post Debridement Measurements of Total Wound Length: (cm) 0.8 Width: (cm) 0.7 Depth: (cm) 0.4 Volume: (cm) 0.176 Character of Wound/Ulcer Post Improved Debridement: Severity of Tissue Post Debridement: Fat layer exposed Post Procedure Diagnosis Same as Pre-procedure Electronic Signature(s) Signed: 03/20/2019 5:02:14 PM By: Worthy Keeler PA-C Signed: 03/20/2019 5:49:26 PM By: Baruch Gouty RN, BSN Entered By: Baruch Gouty on 03/20/2019 11:29:10 -------------------------------------------------------------------------------- HPI Details Patient Name: Date of Service: CATHLEEN, NIEHUES 03/20/2019 10:45 AM Medical Record [id number] Patient Account Number: [id number] Date of Birth/Sex: Treating RN: 26-Jan-1936 (83 y.o. [name] Primary Care Provider: [name] Other Clinician: Referring Provider: Treating Provider/Extender:Stone III, Mardi Mainland, Loni Muse in Treatment: 1 History of Present Illness HPI Description: 03/13/2019 patient presents today for initial evaluation in our clinic concerning an issue that is been present Really since June of this year. The patient has what appeared to have been an abscess which subsequently an IandD indeed was performed in the ER and that eventually healed according to the patient's daughter who is seen with her during the visit today. With that being said an area below the area that healed actually opened following and that is what we are seeing her for today. There actually appears to be hyper granular tissue in the base of the wound just on initial inspection. She does have some epithelial tissue over top of this hyper granulation but it is not really good epithelization. Everything underneath appears to be very fragile unfortunately. The patient does have a history of diabetes mellitus type 2, lymphedema, hypertension, and dementia. She was referred to [place] by her primary care  provider as the wound does not seem to be healing. 03/20/2019 on evaluation today patient appears to be doing better compared to the post debridement measurements and appearance last week. She has been tolerating the dressing changes with the [facility] without complication. Fortunately there is no signs of active infection at this time. No fevers, chills, nausea, vomiting, or diarrhea. The x-ray we did of her knee did show evidence of potential for pseudogout but  currently no signs of osteomyelitis which is great news. Electronic Signature(s) Signed: 03/20/2019 5:02:14 PM By: Worthy Keeler PA-C Entered By: Worthy Keeler on 03/20/2019 11:32:07 -------------------------------------------------------------------------------- Physical Exam Details Patient Name: Date of Service: ELISHEVA, DESPOSITO 03/20/2019 10:45 AM Medical Record [id number] Patient Account Number: [id number] Date of Birth/Sex: Treating RN: 1935/10/18 (83 y.o. [name] Primary Care Provider: [name] Other Clinician: Referring Provider: Treating Provider/Extender:Stone III, Mardi Mainland, Loni Muse in Treatment: 1 Constitutional Well-nourished and well-hydrated in no acute distress. Respiratory normal breathing without difficulty. Psychiatric Patient is not able to cooperate in decision making regarding care. Patient has dementia. patient is confused. Notes Patient's wound currently showed signs of good granulation about half of the wound and the other half had some slough noted I did perform debridement to clear away some of the necrotic tissue in the base of the wound to help with appropriate healing. She had a little bit of discomfort with this but nothing that she seem to react to too much and post debridement the wound bed appears to be doing much better which is great news. Electronic Signature(s) Signed: 03/20/2019 5:02:14 PM By: Worthy Keeler PA-C Entered By: Worthy Keeler on  03/20/2019 11:32:57 -------------------------------------------------------------------------------- Physician Orders Details Patient Name: Date of Service: TRANIYA, ESTENSON 03/20/2019 10:45 AM Medical Record [id number] Patient Account Number: [id number] Date of Birth/Sex: Treating RN: [birth year] (83 y.o. Martyn Malay, Vaughan Basta Primary Care Provider: [name] Other Clinician: Referring Provider: Treating Provider/Extender:Stone III, Mardi Mainland, Loni Muse in Treatment: 1 Verbal / Phone Orders: No Diagnosis Coding ICD-10 Coding Code Description E11.622 Type 2 diabetes mellitus with other skin ulcer I89.0 Lymphedema, not elsewhere classified L97.812 Non-pressure chronic ulcer of other part of right lower leg with fat layer exposed I10 Essential (primary) hypertension F03.90 Unspecified dementia without behavioral disturbance Follow-up Appointments Return Appointment in 1 week. Dressing Change Frequency Wound #1 Right,Lateral Knee Change dressing three times week. Wound Cleansing Wound #1 Right,Lateral Knee Clean wound with Wound Cleanser - or saline Primary Wound Dressing Wound #1 Right,Lateral Knee Hydrofera Blue - ready cut to fit inside wound edges Secondary Dressing Wound #1 Right,Lateral Knee Kerlix/Rolled Gauze - may secure with rolled gauze as neded Foam [place] Wound #1 [facility] skilled nursing for wound care. - [facility] Electronic Signature(s) Signed: 03/20/2019 5:02:14 PM By: Worthy Keeler PA-C Signed: 03/20/2019 5:49:26 PM By: Baruch Gouty RN, BSN Entered By: Baruch Gouty on 03/20/2019 11:30:11 -------------------------------------------------------------------------------- Problem List Details Patient Name: Date of Service: GARY, STICKNEY 03/20/2019 10:45 AM Medical Record [id number] Patient Account Number: [id number] Date of Birth/Sex: Treating RN: 02-Nov-1935 (83 y.o. [name] Primary Care Provider: [name] Other Clinician: Referring Provider: Treating Provider/Extender:Stone III, Mardi Mainland, Loni Muse in Treatment: 1 Active Problems ICD-10 Evaluated Encounter Code Description Active Date Today Diagnosis E11.622 Type 2 diabetes mellitus with other skin ulcer 03/13/2019 No Yes I89.0 Lymphedema, not elsewhere classified 03/13/2019 No Yes L97.812 Non-pressure chronic ulcer of other part of right lower 03/13/2019 No Yes leg with fat layer exposed [place] (primary) hypertension 03/13/2019 No Yes F03.90 Unspecified dementia without behavioral disturbance 03/13/2019 No Yes Inactive Problems Resolved Problems Electronic Signature(s) Signed: 03/20/2019 5:02:14 PM By: Worthy Keeler PA-C Entered By: Worthy Keeler on 03/20/2019 11:04:00 -------------------------------------------------------------------------------- Progress Note Details Patient Name: Date of Service: ALEENAH, JUSTUS 03/20/2019 10:45 AM Medical Record [id number] Patient Account Number: [id number] Date of Birth/Sex: Treating RN: 10-08-1935 (83 y.o. [name] Primary Care Provider: [name] Other  Clinician: Referring Provider: Treating Provider/Extender:Stone III, Mardi Mainland, Loni Muse in Treatment: 1 Subjective Chief Complaint Information obtained from Patient Right LE Ulcer History of Present Illness (HPI) 03/13/2019 patient presents today for initial evaluation in our clinic concerning an issue that is been present Really since June of this year. The patient has what appeared to have been an abscess which subsequently an IandD indeed was performed in the ER and that eventually healed according to the patient's daughter who is seen with her during the visit today. With that being said an area below the area that healed actually opened following and that is what we are seeing her for today. There actually appears to be hyper granular tissue  in the base of the wound just on initial inspection. She does have some epithelial tissue over top of this hyper granulation but it is not really good epithelization. Everything underneath appears to be very fragile unfortunately. The patient does have a history of diabetes mellitus type 2, lymphedema, hypertension, and dementia. She was referred to [place] by her primary care provider as the wound does not seem to be healing. 03/20/2019 on evaluation today patient appears to be doing better compared to the post debridement measurements and appearance last week. She has been tolerating the dressing changes with the [facility] without complication. Fortunately there is no signs of active infection at this time. No fevers, chills, nausea, vomiting, or diarrhea. The x-ray we did of her knee did show evidence of potential for pseudogout but currently no signs of osteomyelitis which is great news. Patient History Information obtained from Patient. Family History Stroke - Siblings, No family history of Cancer, Diabetes, Heart Disease, Hereditary Spherocytosis, Hypertension, Kidney Disease, Lung Disease, Seizures, Thyroid Problems, Tuberculosis. Social History Never smoker, Marital Status - Divorced, Alcohol Use - Never, Drug Use - No History, Caffeine Use - Never. Medical History Eyes Denies history of Cataracts, Glaucoma, Optic Neuritis Ear/Nose/Mouth/Throat Denies history of Chronic sinus problems/congestion, Middle ear problems Hematologic/Lymphatic Denies history of Anemia, Hemophilia, Human Immunodeficiency Virus, Lymphedema, Sickle Cell Disease Respiratory Denies history of Aspiration, Asthma, Chronic Obstructive Pulmonary Disease (COPD), Pneumothorax, Sleep Apnea, Tuberculosis Cardiovascular Patient has history of Hypertension Denies history of Angina, Arrhythmia, Congestive Heart Failure, Coronary Artery Disease, Deep Vein Thrombosis, Hypotension, Myocardial Infarction, Peripheral  Arterial Disease, Peripheral Venous Disease, Phlebitis, Vasculitis Gastrointestinal Denies history of Cirrhosis , Colitis, Crohnoos, Hepatitis A, Hepatitis B, Hepatitis C Endocrine Patient has history of Type II Diabetes Denies history of Type I Diabetes Genitourinary Denies history of End Stage Renal Disease Immunological Denies history of Lupus Erythematosus, Raynaudoos, Scleroderma Integumentary (Skin) Denies history of History of Burn Musculoskeletal Denies history of Gout, Rheumatoid Arthritis, Osteoarthritis, Osteomyelitis Neurologic Denies history of Dementia, Neuropathy, Quadriplegia, Paraplegia, Seizure Disorder Oncologic Denies history of Received Chemotherapy, Received Radiation Psychiatric Denies history of Anorexia/bulimia, Confinement Anxiety Review of Systems (ROS) Constitutional Symptoms (General Health) Denies complaints or symptoms of Fatigue, Fever, Chills, Marked Weight Change. Respiratory Denies complaints or symptoms of Chronic or frequent coughs, Shortness of Breath. Cardiovascular Denies complaints or symptoms of Chest pain. Psychiatric Denies complaints or symptoms of Claustrophobia, Suicidal. Objective Constitutional Well-nourished and well-hydrated in no acute distress. Vitals Time Taken: 11:09 AM, Height: 67 in, Weight: 130 lbs, BMI: 20.4, Temperature: 98.4 F, Pulse: 82 bpm, Respiratory Rate: 16 breaths/min, Blood Pressure: 150/73 mmHg. Respiratory normal breathing without difficulty. Psychiatric Patient is not able to cooperate in decision making regarding care. Patient has dementia. patient is confused. General Notes: Patient's wound currently showed signs of  good granulation about half of the wound and the other half had some slough noted I did perform debridement to clear away some of the necrotic tissue in the base of the wound to help with appropriate healing. She had a little bit of discomfort with this but nothing that she seem to  react to too much and post debridement the wound bed appears to be doing much better which is great news. Integumentary (Hair, Skin) Wound #1 status is Open. Original cause of wound was Trauma. The wound is located on the Right,Lateral Knee. The wound measures 0.8cm length x 0.7cm width x 0.4cm depth; 0.44cm^2 area and 0.176cm^3 volume. There is Fat Layer (Subcutaneous Tissue) Exposed exposed. There is no tunneling or undermining noted. There is a medium amount of serosanguineous drainage noted. The wound margin is distinct with the outline attached to the wound base. There is small (1-33%) red, friable granulation within the wound bed. There is a large (67-100%) amount of necrotic tissue within the wound bed including Adherent Slough. Assessment Active Problems ICD-10 Type 2 diabetes mellitus with other skin ulcer Lymphedema, not elsewhere classified Non-pressure chronic ulcer of other part of right lower leg with fat layer exposed Essential (primary) hypertension Unspecified dementia without behavioral disturbance Procedures Wound #1 Pre-procedure diagnosis of Wound #1 is a Diabetic Wound/Ulcer of the Lower Extremity located on the Right,Lateral Knee .Severity of Tissue Pre Debridement is: Fat layer exposed. There was a Excisional Skin/Subcutaneous Tissue Debridement with a total area of 0.56 sq cm performed by Worthy Keeler, PA. With the following instrument(s): Curette to remove Viable and Non-Viable tissue/material. Material removed includes Subcutaneous Tissue and Slough and after achieving pain control using Lidocaine 4% Topical Solution. No specimens were taken. A time out was conducted at 11:25, prior to the start of the procedure. A Minimum amount of bleeding was controlled with Pressure. The procedure was tolerated well with a pain level of 3 throughout and a pain level of 1 following the procedure. Post Debridement Measurements: 0.8cm length x 0.7cm width x 0.4cm depth;  0.176cm^3 volume. Character of Wound/Ulcer Post Debridement is improved. Severity of Tissue Post Debridement is: Fat layer exposed. Post procedure Diagnosis Wound #1: Same as Pre-Procedure Plan Follow-up Appointments: Return Appointment in 1 week. Dressing Change Frequency: Wound #1 Right,Lateral Knee: Change dressing three times week. Wound Cleansing: Wound #1 Right,Lateral Knee: Clean wound with Wound Cleanser - or saline Primary Wound Dressing: Wound #1 Right,Lateral Knee: Hydrofera Blue - ready cut to fit inside wound edges Secondary Dressing: Wound #1 Right,Lateral Knee: Kerlix/Rolled Gauze - may secure with rolled gauze as neded Foam [facility]: Wound #1 Right,Lateral Knee: [facility] skilled nursing for wound care. - [facility]. I would recommend currently that we go ahead and continue with the current wound care measures which includes the [facility] dressing that seems to be doing well for her. 2. I am also going to suggest that we go ahead and continue to clean the area with saline prior to dressing application and use a bordered foam over top of this which will help with additional padding as well. We will see patient back for reevaluation in 1 week here in the clinic. If anything worsens or changes patient will contact our office for additional recommendations. Electronic Signature(s) Signed: 03/20/2019 5:02:14 PM By: Worthy Keeler PA-C Entered By: Worthy Keeler on 03/20/2019 11:33:25 -------------------------------------------------------------------------------- HxROS Details Patient Name: Date of Service: KALAYSIA, BARBUTO 03/20/2019 10:45 AM Medical Record [id number] Patient Account Number:  [id number] Date of Birth/Sex: Treating RN: 1935/05/11 (83 y.o. [name] Primary Care Provider: [name] Other Clinician: Referring Provider: Treating Provider/Extender:Stone III, Mardi Mainland, Loni Muse in Treatment:  1 Information Obtained From Patient Constitutional Symptoms (General Health) Complaints and Symptoms: Negative for: Fatigue; Fever; Chills; Marked Weight Change Respiratory Complaints and Symptoms: Negative for: Chronic or frequent coughs; Shortness of Breath Medical History: Negative for: Aspiration; Asthma; Chronic Obstructive Pulmonary Disease (COPD); Pneumothorax; Sleep Apnea; Tuberculosis Cardiovascular Complaints and Symptoms: Negative for: Chest pain Medical History: Positive for: Hypertension Negative for: Angina; Arrhythmia; Congestive Heart Failure; Coronary Artery Disease; Deep Vein Thrombosis; Hypotension; Myocardial Infarction; Peripheral Arterial Disease; Peripheral Venous Disease; Phlebitis; Vasculitis Psychiatric Complaints and Symptoms: Negative for: Claustrophobia; Suicidal Medical History: Negative for: Anorexia/bulimia; Confinement Anxiety Eyes Medical History: Negative for: Cataracts; Glaucoma; Optic Neuritis Ear/Nose/Mouth/Throat Medical History: Negative for: Chronic sinus problems/congestion; Middle ear problems Hematologic/Lymphatic Medical History: Negative for: Anemia; Hemophilia; Human Immunodeficiency Virus; Lymphedema; Sickle Cell Disease Gastrointestinal Medical History: Negative for: Cirrhosis ; Colitis; Crohns; Hepatitis A; Hepatitis B; Hepatitis C Endocrine Medical History: Positive for: Type II Diabetes Negative for: Type I Diabetes Time with diabetes: 15 years Treated with: Oral agents Blood sugar tested every day: No Genitourinary Medical History: Negative for: End Stage Renal Disease Immunological Medical History: Negative for: Lupus Erythematosus; Raynauds; Scleroderma Integumentary (Skin) Medical History: Negative for: History of Burn Musculoskeletal Medical History: Negative for: Gout; Rheumatoid Arthritis; Osteoarthritis; Osteomyelitis Neurologic Medical History: Negative for: Dementia; Neuropathy; Quadriplegia;  Paraplegia; Seizure Disorder Oncologic Medical History: Negative for: Received Chemotherapy; Received Radiation Immunizations Pneumococcal Vaccine: Received Pneumococcal Vaccination: No Implantable Devices None Family and Social History Cancer: No; Diabetes: No; Heart Disease: No; Hereditary Spherocytosis: No; Hypertension: No; Kidney Disease: No; Lung Disease: No; Seizures: No; Stroke: Yes - Siblings; Thyroid Problems: No; Tuberculosis: No; Never smoker; Marital Status - Divorced; Alcohol Use: Never; Drug Use: No History; Caffeine Use: Never; Financial Concerns: No; Food, Clothing or Shelter Needs: No; Support System Lacking: No; Transportation Concerns: No Physician Affirmation I have reviewed and agree with the above information. Electronic Signature(s) Signed: 03/20/2019 5:02:14 PM By: Worthy Keeler PA-C Signed: 03/20/2019 5:49:26 PM By: Baruch Gouty RN, BSN Entered By: Worthy Keeler on 03/20/2019 11:32:26 -------------------------------------------------------------------------------- SuperBill Details Patient Name: Date of Service: GETSEMANI, VOROS 03/20/2019 Medical Record [id number] Patient Account Number: [id number] Date of Birth/Sex: Treating RN: 09-28-1935 (83 y.o. Martyn Malay, Linda Primary Care Provider: [name] Other Clinician: Referring Provider: Treating Provider/Extender:Stone III, Mardi Mainland, Loni Muse in Treatment: 1 Diagnosis Coding ICD-10 Codes Code Description E11.622 Type 2 diabetes mellitus with other skin ulcer I89.0 Lymphedema, not elsewhere classified L97.812 Non-pressure chronic ulcer of other part of right lower leg with fat layer exposed I10 Essential (primary) hypertension F03.90 Unspecified dementia without behavioral disturbance Facility Procedures CPT4 Code Description: [id number] - DEB SUBQ TISSUE 20 SQ CM/< ICD-10 Diagnosis Description [id number] Non-pressure chronic ulcer of other part of right lower  leg Modifier: with fat lay Quantity: 1 er exposed Physician Procedures CPT4 Code Description: [id number] - WC PHYS SUBQ TISS 20 SQ CM ICD-10 Diagnosis Description [id number] Non-pressure chronic ulcer of other part of right lower leg Modifier: with fat lay Quantity: 1 er exposed Electronic Signature(s) Signed: 03/20/2019 5:02:14 PM By: Worthy Keeler PA-C Entered By: Worthy Keeler on 03/20/2019 11:33:32

## 2019-03-20 NOTE — Progress Notes (Addendum)
[name], [name] ([id number]) Visit Report for 03/20/2019 Arrival Information Details Patient Name: Date of Service: [name], [name] 03/20/2019 10:45 AM Medical Record [id number] Patient Account Number: [id number] Date of Birth/Sex: Treating RN: 19-Dec-1935 (83 y.o. [name] Primary Care Akeelah Seppala: [name] Other Clinician: Referring Tyreek Clabo: Treating Elya Diloreto/Extender:Stone III, Mardi Mainland, Loni Muse in Treatment: 1 Visit Information History Since Last Visit Wheel Chair Added or deleted any medications: No Patient Arrived: Any new allergies or adverse reactions: No Arrival Time: 11:03 Had a fall or experienced change in No Accompanied By: daughter in activities of daily living that may affect law risk of falls: Transfer Assistance: None Signs or symptoms of abuse/neglect since last No Patient Identification Verified: Yes visito Secondary Verification Process Yes Hospitalized since last visit: No Completed: Implantable device outside of the clinic excluding No Patient Requires Transmission-Based No cellular tissue based products placed in the center Precautions: since last visit: Patient Has Alerts: No Has Dressing in Place as Prescribed: Yes Pain Present Now: No Electronic Signature(s) Signed: 03/20/2019 5:42:00 PM By: Deon Pilling Entered By: Deon Pilling on 03/20/2019 11:07:10 -------------------------------------------------------------------------------- Encounter Discharge Information Details Patient Name: Date of Service: [name], [name] 03/20/2019 10:45 AM Medical Record [id number] Patient Account Number: [id number] Date of Birth/Sex: Treating RN: May 21, 1935 (83 y.o. [name] Primary Care Mariko Nowakowski: [name] Other Clinician: Referring Kiegan Macaraeg: Treating Pierson Vantol/Extender:Stone III, Mardi Mainland, Loni Muse in Treatment: 1 Encounter Discharge Information Items Post Procedure Vitals Discharge Condition:  Stable Temperature (F): 98.4 Ambulatory Status: Wheelchair Pulse (bpm): 82 Discharge Destination: Home Respiratory Rate (breaths/min): 16 Transportation: Private Auto Blood Pressure (mmHg): 150/73 Accompanied By: daughter in law Schedule Follow-up Appointment: Yes Clinical Summary of Care: Electronic Signature(s) Signed: 03/20/2019 5:42:00 PM By: Deon Pilling Entered By: Deon Pilling on 03/20/2019 11:45:40 -------------------------------------------------------------------------------- Lower Extremity Assessment Details Patient Name: Date of Service: [name], [name] 03/20/2019 10:45 AM Medical Record [id number] Patient Account Number: [id number] Date of Birth/Sex: Treating RN: [birth year] (83 y.o. [name] Primary Care Eleftherios Dudenhoeffer: [name] Other Clinician: Referring Humna Moorehouse: Treating Nadeen Shipman/Extender:Stone III, Mardi Mainland, Loni Muse in Treatment: 1 Notes right knee wound. no bilateral lower leg wound. Electronic Signature(s) Signed: 03/20/2019 5:42:00 PM By: Deon Pilling Entered By: Deon Pilling on 03/20/2019 11:09:56 -------------------------------------------------------------------------------- Gerber Details Patient Name: Date of Service: [name], [name] 03/20/2019 10:45 AM Medical Record [id number] Patient Account Number: [id number] Date of Birth/Sex: Treating RN: 1936/02/28 (83 y.o. [name] Primary Care Arienne Gartin: [name] Other Clinician: Referring Dellar Traber: Treating Trayon Krantz/Extender:Stone III, Mardi Mainland, Loni Muse in Treatment: 1 Active Inactive Abuse / Safety / Falls / Self Care Management Nursing Diagnoses: Potential for falls Goals: Patient/caregiver will verbalize/demonstrate measures taken to prevent injury and/or falls Date Initiated: 03/13/2019 Target Resolution Date: 04/10/2019 Goal Status: Active Interventions: Assess fall risk on admission and as needed Assess:  immobility, friction, shearing, incontinence upon admission and as needed Assess impairment of mobility on admission and as needed per policy Notes: Wound/Skin Impairment Nursing Diagnoses: Impaired tissue integrity Knowledge deficit related to ulceration/compromised skin integrity Goals: Patient/caregiver will verbalize understanding of skin care regimen Date Initiated: 03/13/2019 Target Resolution Date: 04/10/2019 Goal Status: Active Ulcer/skin breakdown will have a volume reduction of 30% by week 4 Date Initiated: 03/13/2019 Target Resolution Date: 04/10/2019 Goal Status: Active Interventions: Assess patient/caregiver ability to obtain necessary supplies Assess patient/caregiver ability to perform ulcer/skin care regimen upon admission and as needed Assess ulceration(s) every visit Treatment Activities: Skin care regimen initiated : 03/13/2019 Topical wound management initiated : 03/13/2019 Notes: Electronic Signature(s) Signed: 03/20/2019 5:49:26  PM By: Baruch Gouty RN, BSN Entered By: Baruch Gouty on 03/20/2019 11:26:23 -------------------------------------------------------------------------------- Pain Assessment Details Patient Name: Date of Service: [name], [name] 03/20/2019 10:45 AM Medical Record [id number] Patient Account Number: [id number] Date of Birth/Sex: Treating RN: October 24, 1935 (83 y.o. [name] Primary Care Aliannah Holstrom: [name] Other Clinician: Referring Wallis Vancott: Treating Julianah Marciel/Extender:Stone III, Mardi Mainland, Loni Muse in Treatment: 1 Active Problems Location of Pain Severity and Description of Pain Patient Has Paino No Site Locations Rate the pain. Current Pain Level: 0 Pain Management and Medication Current Pain Management: Medication: No Cold Application: No Rest: No Massage: No Activity: No T.E.N.S.: No Heat Application: No Leg drop or elevation: No Is the Current Pain Management Adequate: Adequate How does  your wound impact your activities of daily livingo Sleep: No Bathing: No Appetite: No Relationship With Others: No Bladder Continence: No Emotions: No Bowel Continence: No Work: No Toileting: No Drive: No Dressing: No Hobbies: No Electronic Signature(s) Signed: 03/20/2019 5:42:00 PM By: Deon Pilling Entered By: Deon Pilling on 03/20/2019 11:09:39 -------------------------------------------------------------------------------- Patient/Caregiver Education Details Patient Name: Date of Service: [name] 11/11/2020andnbsp10:45 AM Medical Record Patient Account Number: [id number] Number: Treating RN: Baruch Gouty Date of Birth/Gender: 01-31-1936 (83 y.o. Other Clinician: F) Treating Worthy Keeler Primary Care Physician: [name] Physician/Extender: Referring Physician: Charlann Lange in Treatment: 1 Education Assessment Education Provided To: Patient Education Topics Provided Wound/Skin Impairment: Methods: Explain/Verbal Responses: Reinforcements needed, State content correctly Electronic Signature(s) Signed: 03/20/2019 5:49:26 PM By: Baruch Gouty RN, BSN Entered By: Baruch Gouty on 03/20/2019 11:26:40 -------------------------------------------------------------------------------- Wound Assessment Details Patient Name: Date of Service: [name], [name] 03/20/2019 10:45 AM Medical Record [id number] Patient Account Number: [id number] Date of Birth/Sex: Treating RN: 05/10/1935 (83 y.o. Helene Shoe, Tammi Klippel Primary Care Tarrance Januszewski: [name] Other Clinician: Referring Jeri Jeanbaptiste: Treating Kirt Chew/Extender:Stone III, Mardi Mainland, Loni Muse in Treatment: 1 Wound Status Wound Number: 1 Primary Diabetic Wound/Ulcer of the Lower Etiology: Extremity Wound Location: Right Knee - Lateral Wound Status: Open Wounding Event: Trauma Comorbid Hypertension, Type II Diabetes Date Acquired: 02/07/2019 History: Weeks Of  Treatment: 1 Clustered Wound: No Photos Wound Measurements Length: (cm) 0.8 Width: (cm) 0.7 Depth: (cm) 0.4 Area: (cm) 0.44 Volume: (cm) 0.176 Wound Description Classification: Grade 2 Wound Margin: Distinct, outline attached Exudate Amount: Medium Exudate Type: Serosanguineous Exudate Color: red, brown Wound Bed Granulation Amount: Small (1-33%) Granulation Quality: Red, Friable Necrotic Amount: Large (67-100%) Necrotic Quality: Adherent Slough r After Cleansing: No ibrino Yes Exposed Structure posed: No (Subcutaneous Tissue) Exposed: Yes osed: No osed: No sed: No ed: No % Reduction in Area: -368.1% % Reduction in Volume: -363.2% Epithelialization: None Tunneling: No Undermining: No Foul Odo Slough/F Fascia Ex Fat Layer Tendon Exp Muscle Exp Joint Expo Bone Expos Treatment Notes Wound #1 (Right, Lateral Knee) 1. Cleanse With Wound Cleanser 2. Periwound Care Skin Prep 3. Primary Dressing Applied Hydrofera Blue 4. Secondary Dressing Foam Border Dressing 5. Secured With Office manager) Signed: 03/22/2019 4:02:07 PM By: Mikeal Hawthorne EMT/HBOT Signed: 03/22/2019 5:02:36 PM By: Deon Pilling Previous Signature: 03/20/2019 5:42:00 PM Version By: Deon Pilling Entered By: Mikeal Hawthorne on 03/22/2019 11:45:44 -------------------------------------------------------------------------------- Vitals Details Patient Name: Date of Service: [name], [name] 03/20/2019 10:45 AM Medical Record [id number] Patient Account Number: [id number] Date of Birth/Sex: Treating RN: 01-04-1936 (83 y.o. [name] Primary Care Khalfani Weideman: [name] Other Clinician: Referring Timberly Yott: Treating Hussien Greenblatt/Extender:Stone III, Mardi Mainland, Loni Muse in Treatment: 1 Vital Signs Time Taken: 11:09 Temperature (F): 98.4 Height (in): 67 Pulse (bpm): 82  Weight (lbs): 130 Respiratory Rate (breaths/min): 16 Body Mass Index  (BMI): 20.4 Blood Pressure (mmHg): 150/73 Reference Range: 80 - 120 mg / dl Electronic Signature(s) Signed: 03/20/2019 5:42:00 PM By: Deon Pilling Entered By: Deon Pilling on 03/20/2019 11:09:31

## 2019-03-26 DIAGNOSIS — Z7901 Long term (current) use of anticoagulants: Secondary | ICD-10-CM | POA: Diagnosis not present

## 2019-03-26 DIAGNOSIS — Z86711 Personal history of pulmonary embolism: Secondary | ICD-10-CM | POA: Diagnosis not present

## 2019-03-26 DIAGNOSIS — I10 Essential (primary) hypertension: Secondary | ICD-10-CM | POA: Diagnosis not present

## 2019-03-26 DIAGNOSIS — F0390 Unspecified dementia without behavioral disturbance: Secondary | ICD-10-CM | POA: Diagnosis not present

## 2019-03-26 DIAGNOSIS — Z7984 Long term (current) use of oral hypoglycemic drugs: Secondary | ICD-10-CM | POA: Diagnosis not present

## 2019-03-26 DIAGNOSIS — Z86718 Personal history of other venous thrombosis and embolism: Secondary | ICD-10-CM | POA: Diagnosis not present

## 2019-03-26 DIAGNOSIS — E1142 Type 2 diabetes mellitus with diabetic polyneuropathy: Secondary | ICD-10-CM | POA: Diagnosis not present

## 2019-03-26 DIAGNOSIS — Z8673 Personal history of transient ischemic attack (TIA), and cerebral infarction without residual deficits: Secondary | ICD-10-CM | POA: Diagnosis not present

## 2019-03-26 DIAGNOSIS — L89153 Pressure ulcer of sacral region, stage 3: Secondary | ICD-10-CM | POA: Diagnosis not present

## 2019-03-26 DIAGNOSIS — R1312 Dysphagia, oropharyngeal phase: Secondary | ICD-10-CM | POA: Diagnosis not present

## 2019-03-26 DIAGNOSIS — I890 Lymphedema, not elsewhere classified: Secondary | ICD-10-CM | POA: Diagnosis not present

## 2019-03-27 ENCOUNTER — Other Ambulatory Visit: Payer: Self-pay

## 2019-03-27 ENCOUNTER — Encounter (HOSPITAL_BASED_OUTPATIENT_CLINIC_OR_DEPARTMENT_OTHER): Payer: Medicare Other | Admitting: Physician Assistant

## 2019-03-27 DIAGNOSIS — E11622 Type 2 diabetes mellitus with other skin ulcer: Secondary | ICD-10-CM | POA: Diagnosis not present

## 2019-03-27 DIAGNOSIS — I10 Essential (primary) hypertension: Secondary | ICD-10-CM | POA: Diagnosis not present

## 2019-03-27 DIAGNOSIS — F0390 Unspecified dementia without behavioral disturbance: Secondary | ICD-10-CM | POA: Diagnosis not present

## 2019-03-27 DIAGNOSIS — L97819 Non-pressure chronic ulcer of other part of right lower leg with unspecified severity: Secondary | ICD-10-CM | POA: Diagnosis not present

## 2019-03-27 DIAGNOSIS — L97812 Non-pressure chronic ulcer of other part of right lower leg with fat layer exposed: Secondary | ICD-10-CM | POA: Diagnosis not present

## 2019-03-27 DIAGNOSIS — I890 Lymphedema, not elsewhere classified: Secondary | ICD-10-CM | POA: Diagnosis not present

## 2019-03-27 NOTE — Progress Notes (Signed)
[name], [name] ([id number]) Visit Report for 03/27/2019 Chief Complaint Document Details Patient Name: Date of Service: [name], [name] 03/27/2019 1:15 PM Medical Record [id number] Patient Account Number: [id number] Date of Birth/Sex: Treating RN: 07-10-1935 (83 y.o. [name] Primary Care Provider: [name] Other Clinician: Referring Provider: Treating Provider/Extender:Stone III, Mardi Mainland, Loni Muse in Treatment: 2 Information Obtained from: Patient Chief Complaint Right LE Ulcer Electronic Signature(s) Signed: 03/27/2019 5:11:42 PM By: Worthy Keeler PA-C Entered By: Worthy Keeler on 03/27/2019 14:23:22 -------------------------------------------------------------------------------- HPI Details Patient Name: Date of Service: [name] 03/27/2019 1:15 PM Medical Record [id number] Patient Account Number: [id number] Date of Birth/Sex: Treating RN: 1935/05/18 (83 y.o. [name] Primary Care Provider: [name] Other Clinician: Referring Provider: Treating Provider/Extender:Stone III, Mardi Mainland, Loni Muse in Treatment: 2 History of Present Illness HPI Description: 03/13/2019 patient presents today for initial evaluation in our clinic concerning an issue that is been present Really since June of this year. The patient has what appeared to have been an abscess which subsequently an IandD indeed was performed in the ER and that eventually healed according to the patient's daughter who is seen with her during the visit today. With that being said an area below the area that healed actually opened following and that is what we are seeing her for today. There actually appears to be hyper granular tissue in the base of the wound just on initial inspection. She does have some epithelial tissue over top of this hyper granulation but it is not really good epithelization. Everything underneath appears to be very fragile  unfortunately. The patient does have a history of diabetes mellitus type 2, lymphedema, hypertension, and dementia. She was referred to [place] by her primary care provider as the wound does not seem to be healing. 03/20/2019 on evaluation today patient appears to be doing better compared to the post debridement measurements and appearance last week. She has been tolerating the dressing changes with the [facility] without complication. Fortunately there is no signs of active infection at this time. No fevers, chills, nausea, vomiting, or diarrhea. The x-ray we did of her knee did show evidence of potential for pseudogout but currently no signs of osteomyelitis which is great news. 03/27/2019 on evaluation today patient actually appears to be doing well with regard to her wound currently. This actually appears to be excellent it is healing quite nicely and overall has great granulation in the base of the wound. There does not appear to be any evidence of active infection at this time which is good news. No fevers, chills, nausea, vomiting, or diarrhea. Electronic Signature(s) Signed: 03/27/2019 5:11:42 PM By: Worthy Keeler PA-C Entered By: Worthy Keeler on 03/27/2019 14:35:18 -------------------------------------------------------------------------------- Physical Exam Details Patient Name: Date of Service: [name] 03/27/2019 1:15 PM Medical Record [id number] Patient Account Number: [id number] Date of Birth/Sex: Treating RN: [birth year] (83 y.o. [name] Primary Care Provider: [name] Other Clinician: Referring Provider: Treating Provider/Extender:Stone III, Mardi Mainland, Loni Muse in Treatment: 2 Constitutional Well-nourished and well-hydrated in no acute distress. Respiratory normal breathing without difficulty. clear to auscultation bilaterally. Cardiovascular regular rate and rhythm with normal S1, S2. Psychiatric Patient is not able to  cooperate in decision making regarding care. Patient has dementia. pleasant and cooperative. Notes Upon inspection patient's wound bed showed signs again of good granulation there does not appear to be any hyper granulation and overall I feel like she is making good progress. Patient's family member who is with her today  also feels like things are going quite well and states that she has not really been complaining of too much discomfort which is also good news. Electronic Signature(s) Signed: 03/27/2019 5:11:42 PM By: Worthy Keeler PA-C Entered By: Worthy Keeler on 03/27/2019 14:35:51 -------------------------------------------------------------------------------- Physician Orders Details Patient Name: Date of Service: [name] 03/27/2019 1:15 PM Medical Record [id number] Patient Account Number: [id number] Date of Birth/Sex: Treating RN: 1936/04/26 (83 y.o. [name] Primary Care Provider: Other Clinician: [name] Referring Provider: Treating Provider/Extender:Stone III, Mardi Mainland, Loni Muse in Treatment: 2 Verbal / Phone Orders: No Diagnosis Coding ICD-10 Coding Code Description E11.622 Type 2 diabetes mellitus with other skin ulcer I89.0 Lymphedema, not elsewhere classified L97.812 Non-pressure chronic ulcer of other part of right lower leg with fat layer exposed I10 Essential (primary) hypertension F03.90 Unspecified dementia without behavioral disturbance Follow-up Appointments Return Appointment in 2 weeks. Dressing Change Frequency Wound #1 Right,Lateral Knee Change dressing three times week. Wound Cleansing Wound #1 Right,Lateral Knee Clean wound with Wound Cleanser - or saline Primary Wound Dressing Wound #1 Right,Lateral Knee Hydrofera Blue - ready cut to fit inside wound edges Secondary Dressing Wound #1 Right,Lateral Knee Kerlix/Rolled Gauze - may secure with rolled gauze as neded Foam [place] Wound #1  [facility] for wound care. - [facility] Electronic Signature(s) Signed: 03/27/2019 5:11:42 PM By: Worthy Keeler PA-C Signed: 03/27/2019 5:42:21 PM By: Baruch Gouty RN, BSN Entered By: Baruch Gouty on 03/27/2019 14:32:32 -------------------------------------------------------------------------------- Problem List Details Patient Name: Date of Service: IVANELL, MARCINKO 03/27/2019 1:15 PM Medical Record [id number] Patient Account Number: [id number] Date of Birth/Sex: Treating RN: 1935/10/20 (83 y.o. [name] Primary Care Provider: [name] Other Clinician: Referring Provider: Treating Provider/Extender:Stone III, Mardi Mainland, Loni Muse in Treatment: 2 Active Problems ICD-10 Evaluated Encounter Code Description Active Date Today Diagnosis E11.622 Type 2 diabetes mellitus with other skin ulcer 03/13/2019 No Yes I89.0 Lymphedema, not elsewhere classified 03/13/2019 No Yes L97.812 Non-pressure chronic ulcer of other part of right lower 03/13/2019 No Yes leg with fat layer exposed [place] (primary) hypertension 03/13/2019 No Yes F03.90 Unspecified dementia without behavioral disturbance 03/13/2019 No Yes Inactive Problems Resolved Problems Electronic Signature(s) Signed: 03/27/2019 5:11:42 PM By: Worthy Keeler PA-C Entered By: Worthy Keeler on 03/27/2019 14:23:18 -------------------------------------------------------------------------------- Progress Note Details Patient Name: Date of Service: MOREEN, CRANMER 03/27/2019 1:15 PM Medical Record [id number] Patient Account Number: [id number] Date of Birth/Sex: Treating RN: 1936-04-08 (83 y.o. [name] Primary Care Provider: [name] Other Clinician: Referring Provider: Treating Provider/Extender:Stone III, Mardi Mainland, Loni Muse in Treatment: 2 Subjective Chief Complaint Information obtained from Patient Right LE  Ulcer History of Present Illness (HPI) 03/13/2019 patient presents today for initial evaluation in our clinic concerning an issue that is been present Really since June of this year. The patient has what appeared to have been an abscess which subsequently an IandD indeed was performed in the ER and that eventually healed according to the patient's daughter who is seen with her during the visit today. With that being said an area below the area that healed actually opened following and that is what we are seeing her for today. There actually appears to be hyper granular tissue in the base of the wound just on initial inspection. She does have some epithelial tissue over top of this hyper granulation but it is not really good epithelization. Everything underneath appears to be very fragile unfortunately. The patient does have a history  of diabetes mellitus type 2, lymphedema, hypertension, and dementia. She was referred to [place] by her primary care provider as the wound does not seem to be healing. 03/20/2019 on evaluation today patient appears to be doing better compared to the post debridement measurements and appearance last week. She has been tolerating the dressing changes with the [facility] without complication. Fortunately there is no signs of active infection at this time. No fevers, chills, nausea, vomiting, or diarrhea. The x-ray we did of her knee did show evidence of potential for pseudogout but currently no signs of osteomyelitis which is great news. 03/27/2019 on evaluation today patient actually appears to be doing well with regard to her wound currently. This actually appears to be excellent it is healing quite nicely and overall has great granulation in the base of the wound. There does not appear to be any evidence of active infection at this time which is good news. No fevers, chills, nausea, vomiting, or diarrhea. Patient History Information obtained from Patient. Family  History Stroke - Siblings, No family history of Cancer, Diabetes, Heart Disease, Hereditary Spherocytosis, Hypertension, Kidney Disease, Lung Disease, Seizures, Thyroid Problems, Tuberculosis. Social History Never smoker, Marital Status - Divorced, Alcohol Use - Never, Drug Use - No History, Caffeine Use - Never. Medical History Eyes Denies history of Cataracts, Glaucoma, Optic Neuritis Ear/Nose/Mouth/Throat Denies history of Chronic sinus problems/congestion, Middle ear problems Hematologic/Lymphatic Denies history of Anemia, Hemophilia, Human Immunodeficiency Virus, Lymphedema, Sickle Cell Disease Respiratory Denies history of Aspiration, Asthma, Chronic Obstructive Pulmonary Disease (COPD), Pneumothorax, Sleep Apnea, Tuberculosis Cardiovascular Patient has history of Hypertension Denies history of Angina, Arrhythmia, Congestive Heart Failure, Coronary Artery Disease, Deep Vein Thrombosis, Hypotension, Myocardial Infarction, Peripheral Arterial Disease, Peripheral Venous Disease, Phlebitis, Vasculitis Gastrointestinal Denies history of Cirrhosis , Colitis, Crohnoos, Hepatitis A, Hepatitis B, Hepatitis C Endocrine Patient has history of Type II Diabetes Denies history of Type I Diabetes Genitourinary Denies history of End Stage Renal Disease Immunological Denies history of Lupus Erythematosus, Raynaudoos, Scleroderma Integumentary (Skin) Denies history of History of Burn Musculoskeletal Denies history of Gout, Rheumatoid Arthritis, Osteoarthritis, Osteomyelitis Neurologic Denies history of Dementia, Neuropathy, Quadriplegia, Paraplegia, Seizure Disorder Oncologic Denies history of Received Chemotherapy, Received Radiation Psychiatric Denies history of Anorexia/bulimia, Confinement Anxiety Review of Systems (ROS) Constitutional Symptoms (General Health) Denies complaints or symptoms of Fatigue, Fever, Chills, Marked Weight Change. Respiratory Denies complaints or symptoms  of Chronic or frequent coughs, Shortness of Breath. Cardiovascular Denies complaints or symptoms of Chest pain. Psychiatric Denies complaints or symptoms of Claustrophobia, Suicidal. Objective Constitutional Well-nourished and well-hydrated in no acute distress. Vitals Time Taken: 1:57 PM, Height: 67 in, Weight: 130 lbs, BMI: 20.4, Temperature: 97.5 F, Pulse: 71 bpm, Respiratory Rate: 18 breaths/min, Blood Pressure: 139/89 mmHg. Respiratory normal breathing without difficulty. clear to auscultation bilaterally. Cardiovascular regular rate and rhythm with normal S1, S2. Psychiatric Patient is not able to cooperate in decision making regarding care. Patient has dementia. pleasant and cooperative. General Notes: Upon inspection patient's wound bed showed signs again of good granulation there does not appear to be any hyper granulation and overall I feel like she is making good progress. Patient's family member who is with her today also feels like things are going quite well and states that she has not really been complaining of too much discomfort which is also good news. Integumentary (Hair, Skin) Wound #1 status is Open. Original cause of wound was Trauma. The wound is located on the Right,Lateral Knee. The wound measures 1cm length x 1cm  width x 0.3cm depth; 0.785cm^2 area and 0.236cm^3 volume. Assessment Active Problems ICD-10 Type 2 diabetes mellitus with other skin ulcer Lymphedema, not elsewhere classified Non-pressure chronic ulcer of other part of right lower leg with fat layer exposed Essential (primary) hypertension Unspecified dementia without behavioral disturbance Plan Follow-up Appointments: Return Appointment in 2 weeks. Dressing Change Frequency: Wound #1 Right,Lateral Knee: Change dressing three times week. Wound Cleansing: Wound #1 Right,Lateral Knee: Clean wound with Wound Cleanser - or saline Primary Wound Dressing: Wound #1 Right,Lateral Knee: Hydrofera  Blue - ready cut to fit inside wound edges Secondary Dressing: Wound #1 Right,Lateral Knee: Kerlix/Rolled Gauze - may secure with rolled gauze as neded Foam [facility]: Wound #1 Right,Lateral Knee: [facility] skilled nursing for wound care. - [facility] my suggestion at this time is good to be that we go ahead and continue with the [facility] dressing I think that is doing a great job and overall I am pleased in that regard. 2. I am getting suggest as well that we go ahead and continue to cover the area with a border foam dressing which also adds additional protection as well which is good news. 3. We will continue with home health for her as well. We will see patient back for reevaluation in 2 weeks here in the clinic. If anything worsens or changes patient will contact our office for additional recommendations. Electronic Signature(s) Signed: 03/27/2019 5:11:42 PM By: Worthy Keeler PA-C Entered By: Worthy Keeler on 03/27/2019 14:36:16 -------------------------------------------------------------------------------- HxROS Details Patient Name: Date of Service: WILLOWDEAN, ZOCCHI 03/27/2019 1:15 PM Medical Record [id number] Patient Account Number: [id number] Date of Birth/Sex: Treating RN: 1935-05-20 (83 y.o. [name] Primary Care Provider: [name] Other Clinician: Referring Provider: Treating Provider/Extender:Stone III, Mardi Mainland, Loni Muse in Treatment: 2 Information Obtained From Patient Constitutional Symptoms (General Health) Complaints and Symptoms: Negative for: Fatigue; Fever; Chills; Marked Weight Change Respiratory Complaints and Symptoms: Negative for: Chronic or frequent coughs; Shortness of Breath Medical History: Negative for: Aspiration; Asthma; Chronic Obstructive Pulmonary Disease (COPD); Pneumothorax; Sleep Apnea; Tuberculosis Cardiovascular Complaints and Symptoms: Negative for: Chest pain Medical  History: Positive for: Hypertension Negative for: Angina; Arrhythmia; Congestive Heart Failure; Coronary Artery Disease; Deep Vein Thrombosis; Hypotension; Myocardial Infarction; Peripheral Arterial Disease; Peripheral Venous Disease; Phlebitis; Vasculitis Psychiatric Complaints and Symptoms: Negative for: Claustrophobia; Suicidal Medical History: Negative for: Anorexia/bulimia; Confinement Anxiety Eyes Medical History: Negative for: Cataracts; Glaucoma; Optic Neuritis Ear/Nose/Mouth/Throat Medical History: Negative for: Chronic sinus problems/congestion; Middle ear problems Hematologic/Lymphatic Medical History: Negative for: Anemia; Hemophilia; Human Immunodeficiency Virus; Lymphedema; Sickle Cell Disease Gastrointestinal Medical History: Negative for: Cirrhosis ; Colitis; Crohns; Hepatitis A; Hepatitis B; Hepatitis C Endocrine Medical History: Positive for: Type II Diabetes Negative for: Type I Diabetes Time with diabetes: 15 years Treated with: Oral agents Blood sugar tested every day: No Genitourinary Medical History: Negative for: End Stage Renal Disease Immunological Medical History: Negative for: Lupus Erythematosus; Raynauds; Scleroderma Integumentary (Skin) Medical History: Negative for: History of Burn Musculoskeletal Medical History: Negative for: Gout; Rheumatoid Arthritis; Osteoarthritis; Osteomyelitis Neurologic Medical History: Negative for: Dementia; Neuropathy; Quadriplegia; Paraplegia; Seizure Disorder Oncologic Medical History: Negative for: Received Chemotherapy; Received Radiation Immunizations Pneumococcal Vaccine: Received Pneumococcal Vaccination: No Implantable Devices None Family and Social History Cancer: No; Diabetes: No; Heart Disease: No; Hereditary Spherocytosis: No; Hypertension: No; Kidney Disease: No; Lung Disease: No; Seizures: No; Stroke: Yes - Siblings; Thyroid Problems: No; Tuberculosis: No; Never smoker; Marital Status -  Divorced; Alcohol Use: Never; Drug Use: No  History; Caffeine Use: Never; Financial Concerns: No; Food, Clothing or Shelter Needs: No; Support System Lacking: No; Transportation Concerns: No Physician Affirmation I have reviewed and agree with the above information. Electronic Signature(s) Signed: 03/27/2019 5:11:42 PM By: Worthy Keeler PA-C Signed: 03/27/2019 5:42:21 PM By: Baruch Gouty RN, BSN Entered By: Worthy Keeler on 03/27/2019 14:35:37 -------------------------------------------------------------------------------- SuperBill Details Patient Name: Date of Service: JARIELIZ, RAQUET 03/27/2019 Medical Record [id number] Patient Account Number: [id number] Date of Birth/Sex: Treating RN: [birth year] (83 y.o. Martyn Malay, Vaughan Basta Primary Care Provider: [name] Other Clinician: Referring Provider: Treating Provider/Extender:Stone III, Mardi Mainland, Loni Muse in Treatment: 2 Diagnosis Coding ICD-10 Codes Code Description E11.622 Type 2 diabetes mellitus with other skin ulcer I89.0 Lymphedema, not elsewhere classified L97.812 Non-pressure chronic ulcer of other part of right lower leg with fat layer exposed I10 Essential (primary) hypertension F03.90 Unspecified dementia without behavioral disturbance Facility Procedures CPT4 Code: [id number] Description: 99213 - WOUND CARE VISIT-LEV 3 EST PT Modifier: Quantity: 1 Physician Procedures CPT4 Code Description: [id number] - WC PHYS LEVEL 4 - EST PT ICD-10 Diagnosis Description E11.622 Type 2 diabetes mellitus with other skin ulcer I89.0 Lymphedema, not elsewhere classified L97.812 Non-pressure chronic ulcer of other part of right lower  I10 Essential (primary) hypertension Modifier: leg with fat la Quantity: 1 yer exposed Electronic Signature(s) Signed: 03/27/2019 5:11:42 PM By: Worthy Keeler PA-C Entered By: Worthy Keeler on 03/27/2019 14:36:27

## 2019-03-28 ENCOUNTER — Other Ambulatory Visit: Payer: Medicare Other | Admitting: *Deleted

## 2019-03-28 ENCOUNTER — Other Ambulatory Visit: Payer: Self-pay

## 2019-03-28 ENCOUNTER — Other Ambulatory Visit: Payer: Medicare Other | Admitting: Licensed Clinical Social Worker

## 2019-03-28 DIAGNOSIS — Z515 Encounter for palliative care: Secondary | ICD-10-CM

## 2019-03-28 NOTE — Progress Notes (Signed)
COMMUNITY PALLIATIVE CARE RN NOTE  PATIENT NAME: [name] DOB: 1935/11/02 MRN: [id number]  PRIMARY CARE PROVIDER: [name], NP  RESPONSIBLE PARTY:  Acct ID - Guarantor Home Phone Work Phone Relationship Acct Type  [id number] BRANDEIS, [phone]  Self P/F     [address], [place],  [address]   Due to the COVID-19 crisis, this virtual check-in visit was done via telephone from my office and it was initiated and consent by this patient and or family.  PLAN OF CARE and INTERVENTION:  1. ADVANCE CARE PLANNING/GOALS OF CARE: Goal is for patient to remain at home and avoid hospitalizations. She has a DNR. 2. PATIENT/CAREGIVER EDUCATION: Safe Mobility/Transfers, Wound Management 3. DISEASE STATUS: Joint virtual check-in visit completed with Palliative care SW, Lynn [name]. Patient denies pain at this time. [name] [name] provided patient update. No dyspnea noted. She had a chest x-ray performed last month that was negative for Pneumonia. She has not been having as many issues with cough, congestion or runny nose in the am. [name] has to have surgery on her shoulder next month so will be unable to assist the hired caregiver in transferring patient. They now have a Hoyer lift and is waiting on the proper sling for it, as recommended by her OT, before using it. Patient's OT has been working with her left hand to help with her contracture. She now has a brace for her left hand to help. 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 She is going to the Wound center for a wound on her left leg. Visits have decreased from weekly to bi-weekly. They continue with a home health RN with [facility] to perform dressing changes to her bottom. They are going to start a new treatment to her bottom on the next visit per [facility].  Her intake is good. Her blood sugars have been more stable with diet modifications. She continues with Metformin BID. Occasional coughing with liquids.  Medications given crushed or cut in half. She continues on Losartan and Clonidine to manage her blood pressures. She is incontinent of both bowel and bladder, but they do get her up to the bedside commode. Her bowel movements have been more regular taking Miralax. She is total care for ADLs, but able to feed herself some. She has hired caregivers from 9a-8p 7 days/week. Will continue to monitor.   HISTORY OF PRESENT ILLNESS:  This is a 83 yo female who resides at home with her son and daughter in law. Palliative care team continues to follow patient. Will continue to visit monthly and PRN. Next visit scheduled for 04/18/19 at 11:00a.  CODE STATUS: DNR ADVANCED DIRECTIVES: Y MOST FORM: no PPS: 30%   (Duration of visit and documentation 60 minutes)   Daryl Eastern, RN BSN

## 2019-03-29 DIAGNOSIS — E1142 Type 2 diabetes mellitus with diabetic polyneuropathy: Secondary | ICD-10-CM | POA: Diagnosis not present

## 2019-03-29 DIAGNOSIS — F0390 Unspecified dementia without behavioral disturbance: Secondary | ICD-10-CM | POA: Diagnosis not present

## 2019-03-29 DIAGNOSIS — I890 Lymphedema, not elsewhere classified: Secondary | ICD-10-CM | POA: Diagnosis not present

## 2019-03-29 DIAGNOSIS — Z7984 Long term (current) use of oral hypoglycemic drugs: Secondary | ICD-10-CM | POA: Diagnosis not present

## 2019-03-29 DIAGNOSIS — L89153 Pressure ulcer of sacral region, stage 3: Secondary | ICD-10-CM | POA: Diagnosis not present

## 2019-03-29 DIAGNOSIS — I10 Essential (primary) hypertension: Secondary | ICD-10-CM | POA: Diagnosis not present

## 2019-03-29 NOTE — Progress Notes (Addendum)
Home Care Visit  03/29/19 [facility]  Marcellous Snarski, [name], LCSW Licensed Clinical Social Worker  Palliative care encounter Dx  Referred by [name], NP Reason for Visit  Additional Documentation    COMMUNITY PALLIATIVE CARE SW NOTE  PATIENT NAME: [name] DOB: [birth year] MRN: [id number]  PRIMARY CARE PROVIDER: [name], NP  RESPONSIBLE PARTY:  Acct ID - Guarantor Home Phone Work Phone Relationship Acct Type  [id number] [name]347 [phone]  Self P/F     [address] [place], [place] [address]    Due to the COVID-19 crisis, this virtual check-in visit was done via telephone from my office and it was initiated and consent given by this patient and or family.  PLAN OF CARE and INTERVENTIONS:             1. GOALS OF CARE/ ADVANCE CARE PLANNING:  The families goal is for patient to remain in her son's home.  She has a DNR. 2. SOCIAL/EMOTIONAL/SPIRITUAL ASSESSMENT/ INTERVENTIONS:  SW and Palliative Care RN, Daryl Eastern, conducted a virtual check in visit with patient's daughter-in-law, [name].  She reports patient is stable.  She has another appointment at the wound clinic.  [name] expressed concern about upcoming shoulder surgery and her ability to care for patient.  Patient now has a [employer] fast streamer.  SW provided active listening and supportive counseling 3. PATIENT/CAREGIVER EDUCATION/ COPING:  [name] copes by problem-solving. 4. PERSONAL EMERGENCY PLAN:  Family will contact EMS. 5. COMMUNITY RESOURCES COORDINATION/ HEALTH CARE NAVIGATION:  Patient has caregivers 9a-8p. 6. FINANCIAL/LEGAL CONCERNS/INTERVENTIONS:  None.     SOCIAL HX:  Social History       Tobacco Use  . Smoking status: Never Smoker  . Smokeless tobacco: Never Used  Substance Use Topics  . Alcohol use: No    CODE STATUS:  DNR ADVANCED DIRECTIVES:  LW and HCPOA MOST FORM COMPLETE:  No HOSPICE EDUCATION PROVIDED:  No PPS:  Appetite is normal.  Patient is  dependent for ADLs. Duration of visit and documentation:  45 minutes.      [name] Kashif Pooler, LCSW    Communication Routing History  None  No questionnaires available.           Orders Placed   None Medication List As of 01/31/2019 5:46 PM   Acetaminophen 500 mg Oral As needed   Atorvastatin Calcium 20 mg Oral Daily-1800   Cholecalciferol 5,000 Units Oral Daily   cloNIDine HCl 0.1 mg Oral Every 8 hours, May take an additional tablet if SBP >170   Patient taking differently:  Take 0.1 mg by mouth 2 (two) times daily. May take an additional tablet if SBP >170    Diclofenac Sodium 1 % 1 application Topical 2 times daily PRN   Gabapentin 300 mg Oral 2 times daily   Losartan Potassium     50 mg Oral Daily, Take 1 along with 25 mg to equal 75 mg total    25 mg Oral Daily, Take along with 50 mg to equal 75    metFORMIN HCl 500 MG 1000 mg by mouth twice daily with breakfast and Dinner  Patient taking differently:  Take 500-1,000 mg by mouth See admin instructions. Take 2 tablets every morning and take 1 tablet every evening   Polyethylene Glycol 3350 17 g Oral Daily   Probiotic Product,Saccharomyces boulardii,Bacillus... 1 capsule Oral Daily   Rivaroxaban 15 mg Oral Daily with supper   Vitamins A & D 1 application Topical As needed   Wound  Dressings Apply to sacrum and change every day as needed   Medication Changes  Visit Diagnoses    Palliative care encounter   Problem List  Level of Service  Level of Service  THN VISIT FOR [place] [THNPMPM]  LOS History

## 2019-04-10 ENCOUNTER — Encounter (HOSPITAL_BASED_OUTPATIENT_CLINIC_OR_DEPARTMENT_OTHER): Payer: Medicare Other | Attending: Physician Assistant | Admitting: Physician Assistant

## 2019-04-10 ENCOUNTER — Other Ambulatory Visit: Payer: Self-pay

## 2019-04-10 DIAGNOSIS — I10 Essential (primary) hypertension: Secondary | ICD-10-CM | POA: Diagnosis not present

## 2019-04-10 DIAGNOSIS — I890 Lymphedema, not elsewhere classified: Secondary | ICD-10-CM | POA: Insufficient documentation

## 2019-04-10 DIAGNOSIS — E11622 Type 2 diabetes mellitus with other skin ulcer: Secondary | ICD-10-CM | POA: Diagnosis present

## 2019-04-10 DIAGNOSIS — F0390 Unspecified dementia without behavioral disturbance: Secondary | ICD-10-CM | POA: Insufficient documentation

## 2019-04-10 DIAGNOSIS — L97819 Non-pressure chronic ulcer of other part of right lower leg with unspecified severity: Secondary | ICD-10-CM | POA: Diagnosis not present

## 2019-04-10 DIAGNOSIS — L97812 Non-pressure chronic ulcer of other part of right lower leg with fat layer exposed: Secondary | ICD-10-CM | POA: Insufficient documentation

## 2019-04-10 DIAGNOSIS — Z09 Encounter for follow-up examination after completed treatment for conditions other than malignant neoplasm: Secondary | ICD-10-CM | POA: Diagnosis not present

## 2019-04-11 ENCOUNTER — Telehealth: Payer: Self-pay | Admitting: *Deleted

## 2019-04-11 NOTE — Telephone Encounter (Signed)
Sounds like this needs to be reassess. Can she bring her in for an office visit? Or we could just place a wound care consult if her wound is not healing

## 2019-04-11 NOTE — Telephone Encounter (Signed)
Spoke with [name] and she stated that the wound does not look bad at all, it is only a small portion that needs to be healed. She just wanted to switch to this because the caregiver has already been using it. Please Advise.

## 2019-04-11 NOTE — Progress Notes (Signed)
[name], ILL ([id number]) Visit Report for 04/10/2019 Chief Complaint Document Details Patient Name: Date of Service: [name], [name] 04/10/2019 10:15 AM Medical Record [id number] Patient Account Number: [id number] Date of Birth/Sex: Treating RN: 22-Nov-1935 (83 y.o. F) Primary Care Provider: [name] Other Clinician: Referring Provider: Treating Provider/Extender:Stone III, Mardi Mainland, Loni Muse in Treatment: 4 Information Obtained from: Patient Chief Complaint Right LE Ulcer Electronic Signature(s) Signed: 04/11/2019 10:36:32 AM By: Worthy Keeler PA-C Entered By: Worthy Keeler on 04/10/2019 10:20:30 -------------------------------------------------------------------------------- HPI Details Patient Name: Date of Service: [name], [name] 04/10/2019 10:15 AM Medical Record [id number] Patient Account Number: [id number] Date of Birth/Sex: Treating RN: Sep 08, 1935 (83 y.o. F) Primary Care Provider: [name] Other Clinician: Referring Provider: Treating Provider/Extender:Stone III, Mardi Mainland, Loni Muse in Treatment: 4 History of Present Illness HPI Description: 03/13/2019 patient presents today for initial evaluation in our clinic concerning an issue that is been present Really since June of this year. The patient has what appeared to have been an abscess which subsequently an IandD indeed was performed in the ER and that eventually healed according to the patient's daughter who is seen with her during the visit today. With that being said an area below the area that healed actually opened following and that is what we are seeing her for today. There actually appears to be hyper granular tissue in the base of the wound just on initial inspection. She does have some epithelial tissue over top of this hyper granulation but it is not really good epithelization. Everything underneath appears to be very fragile unfortunately. The patient does have a  history of diabetes mellitus type 2, lymphedema, hypertension, and dementia. She was referred to [place] by her primary care provider as the wound does not seem to be healing. 03/20/2019 on evaluation today patient appears to be doing better compared to the post debridement measurements and appearance last week. She has been tolerating the dressing changes with the [facility] without complication. Fortunately there is no signs of active infection at this time. No fevers, chills, nausea, vomiting, or diarrhea. The x-ray we did of her knee did show evidence of potential for pseudogout but currently no signs of osteomyelitis which is great news. 03/27/2019 on evaluation today patient actually appears to be doing well with regard to her wound currently. This actually appears to be excellent it is healing quite nicely and overall has great granulation in the base of the wound. There does not appear to be any evidence of active infection at this time which is good news. No fevers, chills, nausea, vomiting, or diarrhea. 04/10/2019 upon evaluation today patient appears to be doing very well in regard to her lower extremity ulcer which in fact appears to be completely healed. There is no signs of active infection at this time. Overall I am very pleased with the progress. Electronic Signature(s) Signed: 04/11/2019 10:36:32 AM By: Worthy Keeler PA-C Entered By: Worthy Keeler on 04/10/2019 11:20:19 -------------------------------------------------------------------------------- Physical Exam Details Patient Name: Date of Service: [name], [name] 04/10/2019 10:15 AM Medical Record [id number] Patient Account Number: [id number] Date of Birth/Sex: Treating RN: [birth year] (83 y.o. F) Primary Care Provider: [name] Other Clinician: Referring Provider: Treating Provider/Extender:Stone III, Mardi Mainland, Loni Muse in Treatment: 4 Constitutional Well-nourished and well-hydrated in no acute  distress. Respiratory normal breathing without difficulty. clear to auscultation bilaterally. Cardiovascular regular rate and rhythm with normal S1, S2. Psychiatric Patient is not able to cooperate in decision making regarding care. Patient has dementia. pleasant and cooperative. Notes  Patient's wound bed currently showed signs again of complete epithelization I do not see any evidence of active infection at this time which is good news. Overall I am very happy with the fact that the patient appears to have healed extremely quickly once we got things cleaned up nicely and headed in the right direction. Electronic Signature(s) Signed: 04/11/2019 10:36:32 AM By: Worthy Keeler PA-C Entered By: Worthy Keeler on 04/10/2019 11:20:46 -------------------------------------------------------------------------------- Physician Orders Details Patient Name: Date of Service: [name], [name] 04/10/2019 10:15 AM Medical Record [id number] Patient Account Number: [id number] Date of Birth/Sex: Treating RN: January 26, 1936 (83 y.o. [name] Primary Care Provider: [name] Other Clinician: Referring Provider: Treating Provider/Extender:Stone III, Mardi Mainland, Loni Muse in Treatment: 4 Verbal / Phone Orders: No Diagnosis Coding ICD-10 Coding Code Description E11.622 Type 2 diabetes mellitus with other skin ulcer I89.0 Lymphedema, not elsewhere classified L97.812 Non-pressure chronic ulcer of other part of right lower leg with fat layer exposed I10 Essential (primary) hypertension F03.90 Unspecified dementia without behavioral disturbance Discharge From [facility] Services Discharge from [place] Frequency Change dressing three times week. - or as needed for 1-2 more weeks to protect Primary Wound Dressing Foam - to right lateral knee healed area for next 1-2 weeks Secondary Dressing Other: - cover with [place] skilled  nursing for wound care. - [facility] Electronic Signature(s) Signed: 04/10/2019 6:22:02 PM By: Baruch Gouty RN, BSN Signed: 04/11/2019 10:36:32 AM By: Worthy Keeler PA-C Entered By: Baruch Gouty on 04/10/2019 11:14:41 -------------------------------------------------------------------------------- Problem List Details Patient Name: Date of Service: [name], [name] 04/10/2019 10:15 AM Medical Record [id number] Patient Account Number: [id number] Date of Birth/Sex: Treating RN: [birth year] (83 y.o. F) Primary Care Provider: [name] Other Clinician: Referring Provider: Treating Provider/Extender:Stone III, Mardi Mainland, Loni Muse in Treatment: 4 Active Problems ICD-10 Evaluated Encounter Code Description Active Date Today Diagnosis E11.622 Type 2 diabetes mellitus with other skin ulcer 03/13/2019 No Yes I89.0 Lymphedema, not elsewhere classified 03/13/2019 No Yes L97.812 Non-pressure chronic ulcer of other part of right lower 03/13/2019 No Yes leg with fat layer exposed Mona (primary) hypertension 03/13/2019 No Yes F03.90 Unspecified dementia without behavioral disturbance 03/13/2019 No Yes Inactive Problems Resolved Problems Electronic Signature(s) Signed: 04/11/2019 10:36:32 AM By: Worthy Keeler PA-C Entered By: Worthy Keeler on 04/10/2019 10:20:26 -------------------------------------------------------------------------------- Progress Note Details Patient Name: Date of Service: [name], [name] 04/10/2019 10:15 AM Medical Record [id number] Patient Account Number: [id number] Date of Birth/Sex: Treating RN: 03/16/1936 (83 y.o. F) Primary Care Provider: [name] Other Clinician: Referring Provider: Treating Provider/Extender:Stone III, Mardi Mainland, Loni Muse in Treatment: 4 Subjective Chief Complaint Information obtained from Patient Right LE Ulcer History of Present Illness (HPI) 03/13/2019 patient presents today for initial  evaluation in our clinic concerning an issue that is been present Really since June of this year. The patient has what appeared to have been an abscess which subsequently an IandD indeed was performed in the ER and that eventually healed according to the patient's daughter who is seen with her during the visit today. With that being said an area below the area that healed actually opened following and that is what we are seeing her for today. There actually appears to be hyper granular tissue in the base of the wound just on initial inspection. She does have some epithelial tissue over top of this hyper granulation but it is not really good epithelization. Everything underneath appears to be very fragile unfortunately. The patient does  have a history of diabetes mellitus type 2, lymphedema, hypertension, and dementia. She was referred to [place] by her primary care provider as the wound does not seem to be healing. 03/20/2019 on evaluation today patient appears to be doing better compared to the post debridement measurements and appearance last week. She has been tolerating the dressing changes with the [facility] without complication. Fortunately there is no signs of active infection at this time. No fevers, chills, nausea, vomiting, or diarrhea. The x-ray we did of her knee did show evidence of potential for pseudogout but currently no signs of osteomyelitis which is great news. 03/27/2019 on evaluation today patient actually appears to be doing well with regard to her wound currently. This actually appears to be excellent it is healing quite nicely and overall has great granulation in the base of the wound. There does not appear to be any evidence of active infection at this time which is good news. No fevers, chills, nausea, vomiting, or diarrhea. 04/10/2019 upon evaluation today patient appears to be doing very well in regard to her lower extremity ulcer which in fact appears to be completely  healed. There is no signs of active infection at this time. Overall I am very pleased with the progress. Patient History Information obtained from Patient. Family History Stroke - Siblings, No family history of Cancer, Diabetes, Heart Disease, Hereditary Spherocytosis, Hypertension, Kidney Disease, Lung Disease, Seizures, Thyroid Problems, Tuberculosis. Social History Never smoker, Marital Status - Divorced, Alcohol Use - Never, Drug Use - No History, Caffeine Use - Never. Medical History Eyes Denies history of Cataracts, Glaucoma, Optic Neuritis Ear/Nose/Mouth/Throat Denies history of Chronic sinus problems/congestion, Middle ear problems Hematologic/Lymphatic Denies history of Anemia, Hemophilia, Human Immunodeficiency Virus, Lymphedema, Sickle Cell Disease Respiratory Denies history of Aspiration, Asthma, Chronic Obstructive Pulmonary Disease (COPD), Pneumothorax, Sleep Apnea, Tuberculosis Cardiovascular Patient has history of Hypertension Denies history of Angina, Arrhythmia, Congestive Heart Failure, Coronary Artery Disease, Deep Vein Thrombosis, Hypotension, Myocardial Infarction, Peripheral Arterial Disease, Peripheral Venous Disease, Phlebitis, Vasculitis Gastrointestinal Denies history of Cirrhosis , Colitis, Crohnoos, Hepatitis A, Hepatitis B, Hepatitis C Endocrine Patient has history of Type II Diabetes Denies history of Type I Diabetes Genitourinary Denies history of End Stage Renal Disease Immunological Denies history of Lupus Erythematosus, Raynaudoos, Scleroderma Integumentary (Skin) Denies history of History of Burn Musculoskeletal Denies history of Gout, Rheumatoid Arthritis, Osteoarthritis, Osteomyelitis Neurologic Denies history of Dementia, Neuropathy, Quadriplegia, Paraplegia, Seizure Disorder Oncologic Denies history of Received Chemotherapy, Received Radiation Psychiatric Denies history of Anorexia/bulimia, Confinement Anxiety Review of Systems  (ROS) Constitutional Symptoms (General Health) Denies complaints or symptoms of Fatigue, Fever, Chills, Marked Weight Change. Respiratory Denies complaints or symptoms of Chronic or frequent coughs, Shortness of Breath. Cardiovascular Denies complaints or symptoms of Chest pain. Psychiatric Denies complaints or symptoms of Claustrophobia, Suicidal. Objective Constitutional Well-nourished and well-hydrated in no acute distress. Vitals Time Taken: 10:40 AM, Height: 67 in, Weight: 130 lbs, BMI: 20.4, Temperature: 97.6 F, Pulse: 86 bpm, Respiratory Rate: 16 breaths/min, Blood Pressure: 159/96 mmHg, Capillary Blood Glucose: 155 mg/dl. General Notes: glucose per cg report Respiratory normal breathing without difficulty. clear to auscultation bilaterally. Cardiovascular regular rate and rhythm with normal S1, S2. Psychiatric Patient is not able to cooperate in decision making regarding care. Patient has dementia. pleasant and cooperative. General Notes: Patient's wound bed currently showed signs again of complete epithelization I do not see any evidence of active infection at this time which is good news. Overall I am very happy with the fact that  the patient appears to have healed extremely quickly once we got things cleaned up nicely and headed in the right direction. Integumentary (Hair, Skin) Wound #1 status is Open. Original cause of wound was Trauma. The wound is located on the Right,Lateral Knee. The wound measures 0cm length x 0cm width x 0cm depth; 0cm^2 area and 0cm^3 volume. There is no tunneling or undermining noted. There is a none present amount of drainage noted. The wound margin is distinct with the outline attached to the wound base. There is no granulation within the wound bed. There is no necrotic tissue within the wound bed. Assessment Active Problems ICD-10 Type 2 diabetes mellitus with other skin ulcer Lymphedema, not elsewhere classified Non-pressure chronic ulcer  of other part of right lower leg with fat layer exposed Essential (primary) hypertension Unspecified dementia without behavioral disturbance Plan Discharge From [facility] Services: Discharge from [place] Frequency: Change dressing three times week. - or as needed for 1-2 more weeks to protect Primary Wound Dressing: Foam - to right lateral knee healed area for next 1-2 weeks Secondary Dressing: Other: - cover with [place]: [place] skilled nursing for wound care. - [facility] 1. I am going to suggest currently that we go ahead and provide just a protective bandage with a piece of foam and a Band-Aid to keep this area covered for the next 1-2 weeks in order to prevent this from being hit or reopened easily. 2. I am also going to suggest that we continue with close monitoring of the patient's wound area to ensure that nothing reopens or changes going on in the future and if it does the need to contact the office let me know we will get her back in for reevaluation. We will see the patient back for follow-up visit as needed. Electronic Signature(s) Signed: 04/11/2019 10:36:32 AM By: Worthy Keeler PA-C Entered By: Worthy Keeler on 04/10/2019 11:22:37 -------------------------------------------------------------------------------- HxROS Details Patient Name: Date of Service: [name], [name] 04/10/2019 10:15 AM Medical Record [id number] Patient Account Number: [id number] Date of Birth/Sex: Treating RN: Sep 03, 1935 (83 y.o. F) Primary Care Provider: [name] Other Clinician: Referring Provider: Treating Provider/Extender:Stone III, Mardi Mainland, Loni Muse in Treatment: 4 Information Obtained From Patient Constitutional Symptoms (General Health) Complaints and Symptoms: Negative for: Fatigue; Fever; Chills; Marked Weight Change Respiratory Complaints and Symptoms: Negative for: Chronic or frequent coughs; Shortness of  Breath Medical History: Negative for: Aspiration; Asthma; Chronic Obstructive Pulmonary Disease (COPD); Pneumothorax; Sleep Apnea; Tuberculosis Cardiovascular Complaints and Symptoms: Negative for: Chest pain Medical History: Positive for: Hypertension Negative for: Angina; Arrhythmia; Congestive Heart Failure; Coronary Artery Disease; Deep Vein Thrombosis; Hypotension; Myocardial Infarction; Peripheral Arterial Disease; Peripheral Venous Disease; Phlebitis; Vasculitis Psychiatric Complaints and Symptoms: Negative for: Claustrophobia; Suicidal Medical History: Negative for: Anorexia/bulimia; Confinement Anxiety Eyes Medical History: Negative for: Cataracts; Glaucoma; Optic Neuritis Ear/Nose/Mouth/Throat Medical History: Negative for: Chronic sinus problems/congestion; Middle ear problems Hematologic/Lymphatic Medical History: Negative for: Anemia; Hemophilia; Human Immunodeficiency Virus; Lymphedema; Sickle Cell Disease Gastrointestinal Medical History: Negative for: Cirrhosis ; Colitis; Crohns; Hepatitis A; Hepatitis B; Hepatitis C Endocrine Medical History: Positive for: Type II Diabetes Negative for: Type I Diabetes Time with diabetes: 15 years Treated with: Oral agents Blood sugar tested every day: No Genitourinary Medical History: Negative for: End Stage Renal Disease Immunological Medical History: Negative for: Lupus Erythematosus; Raynauds; Scleroderma Integumentary (Skin) Medical History: Negative for: History of Burn Musculoskeletal Medical History: Negative for: Gout; Rheumatoid Arthritis; Osteoarthritis; Osteomyelitis Neurologic Medical History: Negative for:  Dementia; Neuropathy; Quadriplegia; Paraplegia; Seizure Disorder Oncologic Medical History: Negative for: Received Chemotherapy; Received Radiation Immunizations Pneumococcal Vaccine: Received Pneumococcal Vaccination: No Implantable Devices None Family and Social History Cancer: No;  Diabetes: No; Heart Disease: No; Hereditary Spherocytosis: No; Hypertension: No; Kidney Disease: No; Lung Disease: No; Seizures: No; Stroke: Yes - Siblings; Thyroid Problems: No; Tuberculosis: No; Never smoker; Marital Status - Divorced; Alcohol Use: Never; Drug Use: No History; Caffeine Use: Never; Financial Concerns: No; Food, Clothing or Shelter Needs: No; Support System Lacking: No; Transportation Concerns: No Physician Affirmation I have reviewed and agree with the above information. Electronic Signature(s) Signed: 04/11/2019 10:36:32 AM By: Worthy Keeler PA-C Entered By: Worthy Keeler on 04/10/2019 11:20:31 -------------------------------------------------------------------------------- SuperBill Details Patient Name: Date of Service: [name], [name] 04/10/2019 Medical Record [id number] Patient Account Number: [id number] Date of Birth/Sex: Treating RN: 08-30-1935 (83 y.o. Martyn Malay, Linda Primary Care Provider: [name] Other Clinician: Referring Provider: Treating Provider/Extender:Stone III, Mardi Mainland, Loni Muse in Treatment: 4 Diagnosis Coding ICD-10 Codes Code Description E11.622 Type 2 diabetes mellitus with other skin ulcer I89.0 Lymphedema, not elsewhere classified L97.812 Non-pressure chronic ulcer of other part of right lower leg with fat layer exposed I10 Essential (primary) hypertension F03.90 Unspecified dementia without behavioral disturbance Facility Procedures CPT4 Code: [id number] Description: 99213 - WOUND CARE VISIT-LEV 3 EST PT Modifier: Quantity: 1 Physician Procedures CPT4 Code Description: [id number] - WC PHYS LEVEL 3 - EST PT ICD-10 Diagnosis Description E11.622 Type 2 diabetes mellitus with other skin ulcer I89.0 Lymphedema, not elsewhere classified L97.812 Non-pressure chronic ulcer of other part of right lower  I10 Essential (primary) hypertension Modifier: leg with fat la Quantity: 1 yer exposed Electronic  Signature(s) Signed: 04/11/2019 10:36:32 AM By: Worthy Keeler PA-C Entered By: Worthy Keeler on 04/10/2019 11:22:56

## 2019-04-11 NOTE — Telephone Encounter (Signed)
Erica notified and agreed.

## 2019-04-11 NOTE — Telephone Encounter (Signed)
Erica with [facility] called and stated that the current wound care orders consist using a Liquid Protectant to [place] and daughter does not think it is helping much. Nurse is wanting to know if they can use  Hydrothermal Bleu and a Foam patch.  Please Advise.

## 2019-04-11 NOTE — Telephone Encounter (Signed)
Called and left detailed message with Danae Chen per her request if she did not answer.

## 2019-04-11 NOTE — Telephone Encounter (Signed)
Okay to switch if this is what they are doing and it is healing well.

## 2019-04-12 ENCOUNTER — Other Ambulatory Visit: Payer: Self-pay | Admitting: *Deleted

## 2019-04-12 MED ORDER — GABAPENTIN 300 MG PO CAPS: 300.0000 mg | ORAL_CAPSULE | Freq: Two times a day (BID) | ORAL | 1 refills | Status: DC

## 2019-04-12 MED ORDER — CLONIDINE HCL 0.1 MG PO TABS: 0.1000 mg | ORAL_TABLET | Freq: Two times a day (BID) | ORAL | 1 refills | Status: DC

## 2019-04-12 MED ORDER — LOSARTAN POTASSIUM 50 MG PO TABS: 50.0000 mg | ORAL_TABLET | Freq: Every day | ORAL | 1 refills | Status: DC

## 2019-04-12 NOTE — Telephone Encounter (Signed)
Patient caregiver requested refills.   Pended Rx and sent to [facility] for approval due to [place].

## 2019-04-15 NOTE — Progress Notes (Signed)
[name] ([id number]) Visit Report for 04/10/2019 Arrival Information Details Patient Name: Date of Service: [name], [name] 04/10/2019 10:15 AM Medical Record [id number] Patient Account Number: [id number] Date of Birth/Sex: Treating RN: Apr 22, 1936 (83 y.o. [name] Primary Care [name]: [name] Other Clinician: Referring [name]: Treating [name]/Extender:[name], [name], [name] in Treatment: 4 Visit Information History Since Last Visit Added or deleted any medications: No Patient Arrived: Wheel Chair Any new allergies or adverse reactions: No Arrival Time: 10:38 Had a fall or experienced change in No activities of daily living that may affect Accompanied By: caregiver risk of falls: Transfer Assistance: None Signs or symptoms of abuse/neglect since last No Patient Identification Verified: Yes visito Secondary Verification Process Completed: Yes Hospitalized since last visit: No Patient Requires Transmission-Based No Implantable device outside of the clinic excluding No Precautions: cellular tissue based products placed in the center Patient Has Alerts: No since last visit: Has Dressing in Place as Prescribed: Yes Pain Present Now: No Electronic Signature(s) Signed: 04/15/2019 5:51:44 PM By: Levan Hurst RN, BSN Entered By: Levan Hurst on 04/10/2019 10:41:23 -------------------------------------------------------------------------------- Clinic Level of Care Assessment Details Patient Name: Date of Service: [name], [name] 04/10/2019 10:15 AM Medical Record [id number] Patient Account Number: [id number] Date of Birth/Sex: Treating RN: 1936/01/20 (83 y.o. [name] Primary Care [name]: [name] Other Clinician: Referring [name]: Treating [name]/Extender:[name], [name], [name] in Treatment: 4 Clinic Level of Care Assessment Items TOOL 4 Quantity Score []  - Use when only an EandM  is performed on FOLLOW-UP visit 0 ASSESSMENTS - Nursing Assessment / Reassessment X - Reassessment of Co-morbidities (includes updates in patient status) 1 10 X - Reassessment of Adherence to Treatment Plan 1 5 ASSESSMENTS - Wound and Skin Assessment / Reassessment X - Simple Wound Assessment / Reassessment - one wound 1 5 []  - Complex Wound Assessment / Reassessment - multiple wounds 0 []  - Dermatologic / Skin Assessment (not related to wound area) 0 ASSESSMENTS - Focused Assessment []  - Circumferential Edema Measurements - multi extremities 0 []  - Nutritional Assessment / Counseling / Intervention 0 []  - Lower Extremity Assessment (monofilament, tuning fork, pulses) 0 []  - Peripheral Arterial Disease Assessment (using hand held doppler) 0 ASSESSMENTS - Ostomy and/or Continence Assessment and Care []  - Incontinence Assessment and Management 0 []  - Ostomy Care Assessment and Management (repouching, etc.) 0 PROCESS - Coordination of Care X - Simple Patient / Family Education for ongoing care 1 15 []  - Complex (extensive) Patient / Family Education for ongoing care 0 X - Staff obtains Programmer, systems, Records, Test Results / Process Orders 1 10 X - Staff telephones HHA, Nursing Homes / Clarify orders / etc 1 10 []  - Routine Transfer to another Facility (non-emergent condition) 0 []  - Routine Hospital Admission (non-emergent condition) 0 []  - New Admissions / Biomedical engineer / Ordering NPWT, Apligraf, etc. 0 []  - Emergency Hospital Admission (emergent condition) 0 X - Simple Discharge Coordination 1 10 []  - Complex (extensive) Discharge Coordination 0 PROCESS - Special Needs []  - Pediatric / Minor Patient Management 0 []  - Isolation Patient Management 0 []  - Hearing / Language / Visual special needs 0 []  - Assessment of Community assistance (transportation, D/C planning, etc.) 0 []  - Additional assistance / Altered mentation 0 []  - Support Surface(s) Assessment (bed, cushion, seat,  etc.) 0 INTERVENTIONS - Wound Cleansing / Measurement X - Simple Wound Cleansing - one wound 1 5 []  - Complex Wound Cleansing - multiple wounds 0 X - Wound Imaging (  photographs - any number of wounds) 1 5 []  - Wound Tracing (instead of photographs) 0 X - Simple Wound Measurement - one wound 1 5 []  - Complex Wound Measurement - multiple wounds 0 INTERVENTIONS - Wound Dressings X - Small Wound Dressing one or multiple wounds 1 10 []  - Medium Wound Dressing one or multiple wounds 0 []  - Large Wound Dressing one or multiple wounds 0 []  - Application of Medications - topical 0 []  - Application of Medications - injection 0 INTERVENTIONS - Miscellaneous []  - External ear exam 0 []  - Specimen Collection (cultures, biopsies, blood, body fluids, etc.) 0 []  - Specimen(s) / Culture(s) sent or taken to Lab for analysis 0 []  - Patient Transfer (multiple staff / Civil Service fast streamer / Similar devices) 0 []  - Simple Staple / Suture removal (25 or less) 0 []  - Complex Staple / Suture removal (26 or more) 0 []  - Hypo / Hyperglycemic Management (close monitor of Blood Glucose) 0 []  - Ankle / Brachial Index (ABI) - do not check if billed separately 0 X - Vital Signs 1 5 Has the patient been seen at the hospital within the last three years: Yes Total Score: 95 Level Of Care: New/Established - Level 3 Electronic Signature(s) Signed: 04/10/2019 6:22:02 PM By: Baruch Gouty RN, BSN Entered By: Baruch Gouty on 04/10/2019 11:09:22 -------------------------------------------------------------------------------- Multi-Disciplinary Care Plan Details Patient Name: Date of Service: [name] 04/10/2019 10:15 AM Medical Record [id number] Patient Account Number: [id number] Date of Birth/Sex: Treating RN: [birth year] (83 y.o. [name] Primary Care Eastyn Dattilo: [name] Other Clinician: Referring Tesia Lybrand: Treating Dallin Mccorkel/Extender:[name], [name], [name] in Treatment:  4 Active Inactive Electronic Signature(s) Signed: 04/10/2019 6:22:02 PM By: Baruch Gouty RN, BSN Entered By: Baruch Gouty on 04/10/2019 11:08:25 -------------------------------------------------------------------------------- Pain Assessment Details Patient Name: Date of Service: [name], [name] 04/10/2019 10:15 AM Medical Record [id number] Patient Account Number: [id number] Date of Birth/Sex: Treating RN: 1936/04/04 (83 y.o. [name] Primary Care Soo Steelman: [name] Other Clinician: Referring Adaline Trejos: Treating Alechia Lezama/Extender:[name], [name], [name] in Treatment: 4 Active Problems Location of Pain Severity and Description of Pain Patient Has Paino No Site Locations Pain Management and Medication Current Pain Management: Electronic Signature(s) Signed: 04/15/2019 5:51:44 PM By: Levan Hurst RN, BSN Entered By: Levan Hurst on 04/10/2019 10:42:03 -------------------------------------------------------------------------------- Patient/Caregiver Education Details Patient Name: Date of Service: [name] 12/2/2020andnbsp10:15 AM Medical Record [id number] Patient Account Number: [id number] Date of Birth/Gender: 1935/09/01 (83 y.o. F) Treating RN: Baruch Gouty Primary Care Physician: [name] Other Clinician: Referring Physician: Treating Physician/Extender:[name], [name], [name] in Treatment: 4 Education Assessment Education Provided To: Patient Education Topics Provided Wound/Skin Impairment: Methods: Explain/Verbal Responses: Reinforcements needed, State content correctly Electronic Signature(s) Signed: 04/10/2019 6:22:02 PM By: Baruch Gouty RN, BSN Entered By: Baruch Gouty on 04/10/2019 11:08:44 -------------------------------------------------------------------------------- Wound Assessment Details Patient Name: Date of Service: [name], [name] 04/10/2019 10:15 AM Medical Record  [id number] Patient Account Number: [id number] Date of Birth/Sex: Treating RN: [birth year] (83 y.o. [name] Primary Care Joshawa Dubin: [name] Other Clinician: Referring Adyn Serna: Treating Gwendlyon Zumbro/Extender:[name], [name], [name] in Treatment: 4 Wound Status Wound Number: 1 Primary Diabetic Wound/Ulcer of the Lower Etiology: Extremity Wound Location: Right Knee - Lateral Wound Status: Healed - Epithelialized Wounding Event: Trauma Comorbid Hypertension, Type II Diabetes Date Acquired: 02/07/2019 History: Weeks Of Treatment: 4 Clustered Wound: No Photos Wound Measurements Length: (cm) 0 % Reduction Width: (cm) 0 % Reduction Depth: (cm) 0 Epithelializ Area: (cm) 0 Tunneling: Volume: (cm) 0  Undermining Wound Description Classification: Grade 2 Foul Odor A Wound Margin: Distinct, outline attached Slough/Fibr Exudate Amount: None Present Wound Bed Granulation Amount: None Present (0%) Necrotic Amount: None Present (0%) Fascia Expo Fat Layer ( Tendon Expo Muscle Expo Joint Expos Bone Expose fter Cleansing: No ino No Exposed Structure sed: No Subcutaneous Tissue) Exposed: No sed: No sed: No ed: No d: No in Area: 100% in Volume: 100% ation: Large (67-100%) No : No Electronic Signature(s) Signed: 04/11/2019 3:43:57 PM By: Mikeal Hawthorne EMT/HBOT Signed: 04/15/2019 5:51:44 PM By: Levan Hurst RN, BSN Entered By: Mikeal Hawthorne on 04/11/2019 13:56:45 -------------------------------------------------------------------------------- Vitals Details Patient Name: Date of Service: [name], [name] 04/10/2019 10:15 AM Medical Record [id number] Patient Account Number: [id number] Date of Birth/Sex: Treating RN: [birth year] (83 y.o. [name] Primary Care Rebekkah Powless: [name] Other Clinician: Referring Carisa Backhaus: Treating Tiago Humphrey/Extender:[name], [name], [name] in Treatment: 4 Vital Signs Time Taken:  10:40 Temperature (F): 97.6 Height (in): 67 Pulse (bpm): 86 Weight (lbs): 130 Respiratory Rate (breaths/min): 16 Body Mass Index (BMI): 20.4 Blood Pressure (mmHg): 159/96 Capillary Blood Glucose (mg/dl): 155 Reference Range: 80 - 120 mg / dl Notes glucose per cg report Electronic Signature(s) Signed: 04/15/2019 5:51:44 PM By: Levan Hurst RN, BSN Entered By: Levan Hurst on 04/10/2019 10:41:58

## 2019-04-16 NOTE — Progress Notes (Signed)
BIDDIE, FASSBENDER ([id number]) Visit Report for 03/27/2019 Arrival Information Details Patient Name: Date of Service: [name], [name] 03/27/2019 1:15 PM Medical Record [id number] Patient Account Number: [id number] Date of Birth/Sex: Treating RN: Oct 04, 1935 (83 y.o. [name] Primary Care [name]: [name] Other Clinician: Referring [name]: Treating [name]/Extender:Stone III, [name], [name] in Treatment: 2 Visit Information History Since Last Visit All ordered tests and consults were completed: No Patient Arrived: Wheel Chair Added or deleted any medications: No Arrival Time: 13:45 Any new allergies or adverse reactions: No Accompanied By: daughter Had a fall or experienced change in No activities of daily living that may affect Transfer Assistance: Manual risk of falls: Patient Identification Verified: Yes Signs or symptoms of abuse/neglect since last No Secondary Verification Process Completed: Yes visito Patient Requires Transmission-Based No Hospitalized since last visit: No Precautions: Implantable device outside of the clinic excluding No Patient Has Alerts: No cellular tissue based products placed in the center since last visit: Has Dressing in Place as Prescribed: Yes Pain Present Now: No Electronic Signature(s) Signed: 04/16/2019 2:50:47 PM By: Carolyn Coria RN Entered By: [name] on 03/27/2019 13:57:49 -------------------------------------------------------------------------------- Clinic Level of Care Assessment Details Patient Name: Date of Service: [name], [name] 03/27/2019 1:15 PM Medical Record [id number] Patient Account Number: [id number] Date of Birth/Sex: Treating RN: 1936-02-13 (83 y.o. [name] Primary Care [name]: [name] Other Clinician: Referring [name]: Treating [name]/Extender:Stone III, [name], [name] in Treatment: 2 Clinic Level of Care Assessment Items TOOL 4  Quantity Score []  - Use when only an EandM is performed on FOLLOW-UP visit 0 ASSESSMENTS - Nursing Assessment / Reassessment X - Reassessment of Co-morbidities (includes updates in patient status) 1 10 X - Reassessment of Adherence to Treatment Plan 1 5 ASSESSMENTS - Wound and Skin Assessment / Reassessment X - Simple Wound Assessment / Reassessment - one wound 1 5 []  - Complex Wound Assessment / Reassessment - multiple wounds 0 []  - Dermatologic / Skin Assessment (not related to wound area) 0 ASSESSMENTS - Focused Assessment []  - Circumferential Edema Measurements - multi extremities 0 []  - Nutritional Assessment / Counseling / Intervention 0 []  - Lower Extremity Assessment (monofilament, tuning fork, pulses) 0 []  - Peripheral Arterial Disease Assessment (using hand held doppler) 0 ASSESSMENTS - Ostomy and/or Continence Assessment and Care []  - Incontinence Assessment and Management 0 []  - Ostomy Care Assessment and Management (repouching, etc.) 0 PROCESS - Coordination of Care X - Simple Patient / Family Education for ongoing care 1 15 []  - Complex (extensive) Patient / Family Education for ongoing care 0 X - Staff obtains Programmer, systems, Records, Test Results / Process Orders 1 10 X - Staff telephones HHA, Nursing Homes / Clarify orders / etc 1 10 []  - Routine Transfer to another Facility (non-emergent condition) 0 []  - Routine Hospital Admission (non-emergent condition) 0 []  - New Admissions / Biomedical engineer / Ordering NPWT, Apligraf, etc. 0 []  - Emergency Hospital Admission (emergent condition) 0 X - Simple Discharge Coordination 1 10 []  - Complex (extensive) Discharge Coordination 0 PROCESS - Special Needs []  - Pediatric / Minor Patient Management 0 []  - Isolation Patient Management 0 []  - Hearing / Language / Visual special needs 0 []  - Assessment of Community assistance (transportation, D/C planning, etc.) 0 []  - Additional assistance / Altered mentation 0 []  - Support  Surface(s) Assessment (bed, cushion, seat, etc.) 0 INTERVENTIONS - Wound Cleansing / Measurement X - Simple Wound Cleansing - one wound 1 5 []  - Complex Wound Cleansing -  multiple wounds 0 X - Wound Imaging (photographs - any number of wounds) 1 5 []  - Wound Tracing (instead of photographs) 0 X - Simple Wound Measurement - one wound 1 5 []  - Complex Wound Measurement - multiple wounds 0 INTERVENTIONS - Wound Dressings X - Small Wound Dressing one or multiple wounds 1 10 []  - Medium Wound Dressing one or multiple wounds 0 []  - Large Wound Dressing one or multiple wounds 0 X - Application of Medications - topical 1 5 []  - Application of Medications - injection 0 INTERVENTIONS - Miscellaneous []  - External ear exam 0 []  - Specimen Collection (cultures, biopsies, blood, body fluids, etc.) 0 []  - Specimen(s) / Culture(s) sent or taken to Lab for analysis 0 []  - Patient Transfer (multiple staff / Civil Service fast streamer / Similar devices) 0 []  - Simple Staple / Suture removal (25 or less) 0 []  - Complex Staple / Suture removal (26 or more) 0 []  - Hypo / Hyperglycemic Management (close monitor of Blood Glucose) 0 []  - Ankle / Brachial Index (ABI) - do not check if billed separately 0 X - Vital Signs 1 5 Has the patient been seen at the hospital within the last three years: Yes Total Score: 100 Level Of Care: New/Established - Level 3 Electronic Signature(s) Signed: 03/27/2019 5:42:21 PM By: Baruch Gouty RN, BSN Entered By: Baruch Gouty on 03/27/2019 14:32:59 -------------------------------------------------------------------------------- Encounter Discharge Information Details Patient Name: Date of Service: [name], [name] 03/27/2019 1:15 PM Medical Record [id number] Patient Account Number: [id number] Date of Birth/Sex: Treating RN: [birth year] (83 y.o. [name] Primary Care [name]: [name] Other Clinician: Referring [name]: Treating Cire Deyarmin/Extender:Stone III,  [name], [name] in Treatment: 2 Encounter Discharge Information Items Discharge Condition: Stable Ambulatory Status: Wheelchair Discharge Destination: Home Transportation: Private Auto Accompanied By: daughter Schedule Follow-up Appointment: Yes Clinical Summary of Care: Electronic Signature(s) Signed: 03/27/2019 5:18:27 PM By: Deon Pilling Entered By: Deon Pilling on 03/27/2019 14:59:10 -------------------------------------------------------------------------------- [place] Details Patient Name: Date of Service: [name], [name] 03/27/2019 1:15 PM Medical Record [id number] Patient Account Number: [id number] Date of Birth/Sex: Treating RN: Jul 18, 1935 (83 y.o. [name] Primary Care Glen Kesinger: [name] Other Clinician: Referring Destyne Goodreau: Treating Zeek Rostron/Extender:Stone III, [name], [name] in Treatment: 2 Active Inactive Abuse / Safety / Falls / Self Care Management Nursing Diagnoses: Potential for falls Goals: Patient/caregiver will verbalize/demonstrate measures taken to prevent injury and/or falls Date Initiated: 03/13/2019 Target Resolution Date: 04/10/2019 Goal Status: Active Interventions: Assess fall risk on admission and as needed Assess: immobility, friction, shearing, incontinence upon admission and as needed Assess impairment of mobility on admission and as needed per policy Notes: Wound/Skin Impairment Nursing Diagnoses: Impaired tissue integrity Knowledge deficit related to ulceration/compromised skin integrity Goals: Patient/caregiver will verbalize understanding of skin care regimen Date Initiated: 03/13/2019 Target Resolution Date: 04/10/2019 Goal Status: Active Ulcer/skin breakdown will have a volume reduction of 30% by week 4 Date Initiated: 03/13/2019 Target Resolution Date: 04/10/2019 Goal Status: Active Interventions: Assess patient/caregiver ability to obtain necessary  supplies Assess patient/caregiver ability to perform ulcer/skin care regimen upon admission and as needed Assess ulceration(s) every visit Treatment Activities: Skin care regimen initiated : 03/13/2019 Topical wound management initiated : 03/13/2019 Notes: Electronic Signature(s) Signed: 03/27/2019 5:42:21 PM By: Baruch Gouty RN, BSN Entered By: Baruch Gouty on 03/27/2019 14:06:45 -------------------------------------------------------------------------------- Pain Assessment Details Patient Name: Date of Service: [name], [name] 03/27/2019 1:15 PM Medical Record [id number] Patient Account Number: [id number] Date of Birth/Sex: Treating RN: [birth year] (83 y.o. F) Epps, Carrie Primary  Care Waqas Bruhl: [name] Other Clinician: Referring Jisele Price: Treating Kerman Pfost/Extender:Stone III, [name], [name] in Treatment: 2 Active Problems Location of Pain Severity and Description of Pain Patient Has Paino No Site Locations Pain Management and Medication Current Pain Management: Electronic Signature(s) Signed: 04/16/2019 2:50:47 PM By: Carolyn Coria RN Entered By: [name] on 03/27/2019 13:58:25 -------------------------------------------------------------------------------- Patient/Caregiver Education Details Patient Name: Date of Service: [name] 11/18/2020andnbsp1:15 PM Medical Record [id number] Patient Account Number: [id number] Date of Birth/Gender: 1935/10/24 (83 y.o. F) Treating RN: Baruch Gouty Primary Care Physician: [name] Other Clinician: Referring Physician: Treating Physician/Extender:Stone III, [name], [name] in Treatment: 2 Education Assessment Education Provided To: Patient Education Topics Provided Wound/Skin Impairment: Methods: Explain/Verbal Responses: Reinforcements needed, State content correctly Electronic Signature(s) Signed: 03/27/2019 5:42:21 PM By: Baruch Gouty RN, BSN Entered By:  Baruch Gouty on 03/27/2019 14:07:00 -------------------------------------------------------------------------------- Wound Assessment Details Patient Name: Date of Service: [name], [name] 03/27/2019 1:15 PM Medical Record [id number] Patient Account Number: [id number] Date of Birth/Sex: Treating RN: 1935-05-12 (83 y.o. [name] Primary Care Burtis Imhoff: [name] Other Clinician: Referring Mariam Helbert: Treating Alee Gressman/Extender:Stone III, [name], [name] in Treatment: 2 Wound Status Wound Number: 1 Primary Diabetic Wound/Ulcer of the Lower Etiology: Extremity Wound Location: Right Knee - Lateral Wound Status: Open Wounding Event: Trauma Comorbid Hypertension, Type II Diabetes Date Acquired: 02/07/2019 History: Weeks Of Treatment: 2 Clustered Wound: No Photos Wound Measurements Length: (cm) 1 % Reduction Width: (cm) 1 % Reduction Depth: (cm) 0.3 Epithelializ Area: (cm) 0.785 Volume: (cm) 0.236 Wound Description Classification: Grade 2 Foul Odor A Wound Margin: Distinct, outline attached Slough/Fibr Exudate Amount: Medium Exudate Type: Serosanguineous Exudate Color: red, brown Wound Bed Granulation Amount: Small (1-33%) Granulation Quality: Red, Friable Fascia Expos Necrotic Amount: Large (67-100%) Fat Layer (S Necrotic Quality: Adherent Slough Tendon Expos Muscle Expos Joint Expose Bone Exposed fter Cleansing: No ino Yes Exposed Structure ed: No ubcutaneous Tissue) Exposed: Yes ed: No ed: No d: No : No in Area: -735.1% in Volume: -521.1% ation: None Electronic Signature(s) Signed: 04/02/2019 7:37:04 AM By: Mikeal Hawthorne EMT/HBOT Signed: 04/16/2019 2:50:47 PM By: Carolyn Coria RN Entered By: Mikeal Hawthorne on 04/01/2019 08:11:51 -------------------------------------------------------------------------------- [place] Details Patient Name: Date of Service: [name], [name] 03/27/2019 1:15 PM Medical Record [id number] Patient  Account Number: [id number] Date of Birth/Sex: Treating RN: July 21, 1935 (83 y.o. [name] Primary Care Nichola Warren: [name] Other Clinician: Referring Graciella Arment: Treating Jahmere Bramel/Extender:Stone III, [name], [name] in Treatment: 2 Vital Signs Time Taken: 13:57 Temperature (F): 97.5 Height (in): 67 Pulse (bpm): 71 Weight (lbs): 130 Respiratory Rate (breaths/min): 18 Body Mass Index (BMI): 20.4 Blood Pressure (mmHg): 139/89 Reference Range: 80 - 120 mg / dl Electronic Signature(s) Signed: 04/16/2019 2:50:47 PM By: Carolyn Coria RN Entered By: [name] on 03/27/2019 13:58:19

## 2019-04-17 NOTE — Progress Notes (Signed)
[name], [name] ([id number]) Visit Report for 03/13/2019 Allergy List Details Patient Name: Date of Service: [name], [name] 03/13/2019 10:30 AM Medical Record [id number] Patient Account Number: [id number] Date of Birth/Sex: Treating RN: [birth year] (83 y.o. [name] Primary Care Natacia Chaisson: [name] Other Clinician: Referring Yazen Rosko: Treating Lorri Fukuhara/Extender:Stone III, Mardi Mainland, Loni Muse in Treatment: 0 Allergies Active Allergies No Known Allergies Allergy Notes Electronic Signature(s) Signed: 04/17/2019 12:05:18 PM By: Carlene Coria RN Entered By: Carlene Coria on 03/13/2019 11:43:51 -------------------------------------------------------------------------------- Arrival Information Details Patient Name: Date of Service: [name], [name] 03/13/2019 10:30 AM Medical Record [id number] Patient Account Number: [id number] Date of Birth/Sex: Treating RN: 09/20/1935 (83 y.o. [name] Primary Care Perry Brucato: [name] Other Clinician: Referring Shaquill Iseman: Treating Keilin Gamboa/Extender:Stone III, Mardi Mainland, Loni Muse in Treatment: 0 Visit Information Patient Arrived: Wheel Chair Arrival Time: 11:39 Accompanied By: daughter Transfer Assistance: None Patient Identification Verified: Yes Secondary Verification Process Completed: Yes Patient Requires Transmission-Based No Precautions: Patient Has Alerts: No Electronic Signature(s) Signed: 04/17/2019 12:05:18 PM By: Carlene Coria RN Entered By: Carlene Coria on 03/13/2019 11:42:45 -------------------------------------------------------------------------------- Clinic Level of Care Assessment Details Patient Name: Date of Service: [name], [name] 03/13/2019 10:30 AM Medical Record [id number] Patient Account Number: [id number] Date of Birth/Sex: Treating RN: 06/20/1935 (83 y.o. [name] Primary Care Kole Hilyard: [name] Other Clinician: Referring Jaeli Grubb: Treating  Bryn Perkin/Extender:Stone III, Mardi Mainland, Loni Muse in Treatment: 0 Clinic Level of Care Assessment Items TOOL 1 Quantity Score []  - Use when EandM and Procedure is performed on INITIAL visit 0 ASSESSMENTS - Nursing Assessment / Reassessment X - General Physical Exam (combine w/ comprehensive assessment (listed just below) 1 20 when performed on new pt. evals) X - Comprehensive Assessment (HX, ROS, Risk Assessments, Wounds Hx, etc.) 1 25 ASSESSMENTS - Wound and Skin Assessment / Reassessment []  - Dermatologic / Skin Assessment (not related to wound area) 0 ASSESSMENTS - Ostomy and/or Continence Assessment and Care []  - Incontinence Assessment and Management 0 []  - Ostomy Care Assessment and Management (repouching, etc.) 0 PROCESS - Coordination of Care X - Simple Patient / Family Education for ongoing care 1 15 []  - Complex (extensive) Patient / Family Education for ongoing care 0 X - Staff obtains Programmer, systems, Records, Test Results / Process Orders 1 10 X - Staff telephones HHA, Nursing Homes / Clarify orders / etc 1 10 []  - Routine Transfer to another Facility (non-emergent condition) 0 []  - Routine Hospital Admission (non-emergent condition) 0 X - New Admissions / Biomedical engineer / Ordering NPWT, Apligraf, etc. 1 15 []  - Emergency Hospital Admission (emergent condition) 0 PROCESS - Special Needs []  - Pediatric / Minor Patient Management 0 []  - Isolation Patient Management 0 []  - Hearing / Language / Visual special needs 0 []  - Assessment of Community assistance (transportation, D/C planning, etc.) 0 []  - Additional assistance / Altered mentation 0 []  - Support Surface(s) Assessment (bed, cushion, seat, etc.) 0 INTERVENTIONS - Miscellaneous []  - External ear exam 0 []  - Patient Transfer (multiple staff / Civil Service fast streamer / Similar devices) 0 []  - Simple Staple / Suture removal (25 or less) 0 []  - Complex Staple / Suture removal (26 or more) 0 []  - Hypo/Hyperglycemic  Management (do not check if billed separately) 0 []  - Ankle / Brachial Index (ABI) - do not check if billed separately 0 Has the patient been seen at the hospital within the last three years: Yes Total Score: 95 Level Of Care: New/Established - Level 3 Electronic Signature(s) Signed: 03/13/2019 6:42:58 PM By:  Baruch Gouty RN, BSN Entered By: Baruch Gouty on 03/13/2019 12:41:17 -------------------------------------------------------------------------------- Encounter Discharge Information Details Patient Name: Date of Service: [name], [name] 03/13/2019 10:30 AM Medical Record [id number] Patient Account Number: [id number] Date of Birth/Sex: Treating RN: Mar 11, 1936 (83 y.o. [name] Primary Care Jesenia Spera: [name] Other Clinician: Referring Andrzej Scully: Treating Dujuan Stankowski/Extender:Stone III, Mardi Mainland, Loni Muse in Treatment: 0 Encounter Discharge Information Items Post Procedure Vitals Discharge Condition: Stable Temperature (F): 97.8 Ambulatory Status: Wheelchair Pulse (bpm): 81 Discharge Destination: Home Respiratory Rate (breaths/min): 18 Transportation: Private Auto Blood Pressure (mmHg): 171/85 Accompanied By: caregiver Schedule Follow-up Appointment: No Clinical Summary of Care: Electronic Signature(s) Signed: 03/18/2019 5:46:53 PM By: Levan Hurst RN, BSN Entered By: Levan Hurst on 03/13/2019 17:57:35 -------------------------------------------------------------------------------- [place] Details Patient Name: Date of Service: [name], [name] 03/13/2019 10:30 AM Medical Record [id number] Patient Account Number: [id number] Date of Birth/Sex: Treating RN: [birth year] (83 y.o. [name] Primary Care Stevon Gough: [name] Other Clinician: Referring Ayani Ospina: Treating Hyatt Capobianco/Extender:Stone III, Mardi Mainland, Loni Muse in Treatment: 0 Active Inactive Abuse / Safety / Falls / Self Care  Management Nursing Diagnoses: Potential for falls Goals: Patient/caregiver will verbalize/demonstrate measures taken to prevent injury and/or falls Date Initiated: 03/13/2019 Target Resolution Date: 04/10/2019 Goal Status: Active Interventions: Assess fall risk on admission and as needed Assess: immobility, friction, shearing, incontinence upon admission and as needed Assess impairment of mobility on admission and as needed per policy Notes: Wound/Skin Impairment Nursing Diagnoses: Impaired tissue integrity Knowledge deficit related to ulceration/compromised skin integrity Goals: Patient/caregiver will verbalize understanding of skin care regimen Date Initiated: 03/13/2019 Target Resolution Date: 04/10/2019 Goal Status: Active Ulcer/skin breakdown will have a volume reduction of 30% by week 4 Date Initiated: 03/13/2019 Target Resolution Date: 04/10/2019 Goal Status: Active Interventions: Assess patient/caregiver ability to obtain necessary supplies Assess patient/caregiver ability to perform ulcer/skin care regimen upon admission and as needed Assess ulceration(s) every visit Treatment Activities: Skin care regimen initiated : 03/13/2019 Topical wound management initiated : 03/13/2019 Notes: Electronic Signature(s) Signed: 03/13/2019 6:42:58 PM By: Baruch Gouty RN, BSN Entered By: Baruch Gouty on 03/13/2019 12:39:25 -------------------------------------------------------------------------------- Pain Assessment Details Patient Name: Date of Service: [name], [name] 03/13/2019 10:30 AM Medical Record [id number] Patient Account Number: [id number] Date of Birth/Sex: Treating RN: 1936-03-15 (83 y.o. [name] Primary Care Taegen Delker: [name] Other Clinician: Referring Ellyson Rarick: Treating Lisle Skillman/Extender:Stone III, Mardi Mainland, Loni Muse in Treatment: 0 Active Problems Location of Pain Severity and Description of Pain Patient Has Paino No Site  Locations Pain Management and Medication Current Pain Management: Electronic Signature(s) Signed: 04/17/2019 12:05:18 PM By: Carlene Coria RN Entered By: Carlene Coria on 03/13/2019 12:02:37 -------------------------------------------------------------------------------- Patient/Caregiver Education Details Patient Name: Date of Service: [name] 11/4/2020andnbsp10:30 AM Medical Record [id number] Patient Account Number: [id number] Date of Birth/Gender: 02/26/1936 (83 y.o. F) Treating RN: Baruch Gouty Primary Care Physician: [name] Other Clinician: Referring Physician: Treating Physician/Extender:Stone III, Mardi Mainland, Loni Muse in Treatment: 0 Education Assessment Education Provided To: Patient Education Topics Provided Welcome To [facility]: Handouts: Welcome To [facility] Methods: Explain/Verbal, Printed Responses: Reinforcements needed, State content correctly Wound/Skin Impairment: Handouts: Caring for Your Ulcer, Skin Care Do's and Dont's Methods: Explain/Verbal, Printed Responses: Reinforcements needed, State content correctly Electronic Signature(s) Signed: 03/13/2019 6:42:58 PM By: Baruch Gouty RN, BSN Entered By: Baruch Gouty on 03/13/2019 12:40:41 -------------------------------------------------------------------------------- Wound Assessment Details Patient Name: Date of Service: [name], [name] 03/13/2019 10:30 AM Medical Record [id number] Patient Account Number: [id number] Date of Birth/Sex: Treating RN: 1936-03-13 (83 y.o.  [name] Primary Care Tevis Dunavan: [name] Other Clinician: Referring Kobi Mario: Treating Joeanthony Seeling/Extender:Stone III, Mardi Mainland, Loni Muse in Treatment: 0 Wound Status Wound Number: 1 Primary Diabetic Wound/Ulcer of the Lower Etiology: Extremity Wound Location: Right Knee - Lateral Wound Status: Open Wounding Event: Trauma Comorbid Hypertension, Type II  Diabetes Date Acquired: 02/07/2019 History: Weeks Of Treatment: 0 Clustered Wound: No Photos Wound Measurements Length: (cm) 0.4 Width: (cm) 0.3 Depth: (cm) 0.4 Area: (cm) 0.094 Volume: (cm) 0.038 Wound Description Classification: Grade 2 Exudate Amount: Medium Exudate Type: Serosanguineous Exudate Color: red, brown Wound Bed Granulation Amount: Large (67-100%) Granulation Quality: Red Necrotic Amount: Small (1-33%) Necrotic Quality: Adherent Slough fter Cleansing: No ino Yes Exposed Structure ed: No ubcutaneous Tissue) Exposed: Yes ed: No ed: No d: No : No % Reduction in Area: 0% % Reduction in Volume: 0% Epithelialization: None Tunneling: No Undermining: No Foul Odor A Slough/Fibr Fascia Expos Fat Layer (S Tendon Expos Muscle Expos Joint Expose Bone Exposed Electronic Signature(s) Signed: 03/14/2019 4:13:56 PM By: Mikeal Hawthorne EMT/HBOT Signed: 04/17/2019 12:05:18 PM By: Carlene Coria RN Entered By: Mikeal Hawthorne on 03/14/2019 11:01:38 -------------------------------------------------------------------------------- Vitals Details Patient Name: Date of Service: [name], [name] 03/13/2019 10:30 AM Medical Record [id number] Patient Account Number: [id number] Date of Birth/Sex: Treating RN: December 28, 1935 (83 y.o. [name] Primary Care Cordarryl Monrreal: [name] Other Clinician: Referring Shontelle Muska: Treating Itha Kroeker/Extender:Stone III, Mardi Mainland, Loni Muse in Treatment: 0 Vital Signs Time Taken: 11:42 Temperature (F): 97.8 Height (in): 67 Pulse (bpm): 81 Source: Stated Respiratory Rate (breaths/min): 18 Weight (lbs): 130 Blood Pressure (mmHg): 171/85 Source: Stated Reference Range: 80 - 120 mg / dl Body Mass Index (BMI): 20.4 Electronic Signature(s) Signed: 04/17/2019 12:05:18 PM By: Carlene Coria RN Entered By: Carlene Coria on 03/13/2019 11:43:39

## 2019-04-17 NOTE — Progress Notes (Signed)
[name] ([id number]) Visit Report for 03/13/2019 Chief Complaint Document Details Patient Name: Date of Service: [name] 03/13/2019 10:30 AM Medical Record [id number] Patient Account Number: [id number] Date of Birth/Sex: Treating RN: October 08, 1935 (83 y.o. [name] Primary Care Provider: [name] Other Clinician: Referring Provider: Treating Provider/Extender:Stone III, Mardi Mainland, Loni Muse in Treatment: 0 Information Obtained from: Patient Chief Complaint Right LE Ulcer Electronic Signature(s) Signed: 03/13/2019 6:48:11 PM By: Worthy Keeler PA-C Entered By: Worthy Keeler on 03/13/2019 12:32:33 -------------------------------------------------------------------------------- Debridement Details Patient Name: Date of Service: [name], [name] 03/13/2019 10:30 AM Medical Record [id number] Patient Account Number: [id number] Date of Birth/Sex: Treating RN: 09-09-1935 (83 y.o. [name] Primary Care Provider: [name] Other Clinician: Referring Provider: Treating Provider/Extender:Stone III, Mardi Mainland, Loni Muse in Treatment: 0 Debridement Performed for Wound #1 Right,Lateral Knee Assessment: Performed By: Physician Worthy Keeler, PA Debridement Type: Debridement Severity of Tissue Pre Fat layer exposed Debridement: Level of Consciousness (Pre- Awake and Alert procedure): Pre-procedure Verification/Time Out Taken: Yes - 12:35 Start Time: 12:39 Pain Control: Lidocaine 5% topical ointment Total Area Debrided (L x W): 1 (cm) x 0.9 (cm) = 0.9 (cm) Tissue and other material Viable, Non-Viable, Subcutaneous, Other: hypergranulation tissue debrided: Level: Skin/Subcutaneous Tissue Debridement Description: Excisional Instrument: Curette Bleeding: Minimum Hemostasis Achieved: Silver Nitrate End Time: 12:42 Procedural Pain: 3 Post Procedural Pain: 2 Response to Treatment: Procedure was tolerated well Level  of Consciousness Awake and Alert (Post-procedure): Post Debridement Measurements of Total Wound Length: (cm) 1 Width: (cm) 0.9 Depth: (cm) 0.4 Volume: (cm) 0.283 Character of Wound/Ulcer Post Improved Debridement: Severity of Tissue Post Debridement: Fat layer exposed Post Procedure Diagnosis Same as Pre-procedure Electronic Signature(s) Signed: 03/13/2019 6:42:58 PM By: Baruch Gouty RN, BSN Signed: 03/13/2019 6:48:11 PM By: Worthy Keeler PA-C Entered By: Baruch Gouty on 03/13/2019 12:43:52 -------------------------------------------------------------------------------- HPI Details Patient Name: Date of Service: [name], [name] 03/13/2019 10:30 AM Medical Record [id number] Patient Account Number: [id number] Date of Birth/Sex: Treating RN: 06-Mar-1936 (83 y.o. [name] Primary Care Provider: [name] Other Clinician: Referring Provider: Treating Provider/Extender:Stone III, Mardi Mainland, Loni Muse in Treatment: 0 History of Present Illness HPI Description: 03/13/2019 patient presents today for initial evaluation in our clinic concerning an issue that is been present Really since June of this year. The patient has what appeared to have been an abscess which subsequently an IandD indeed was performed in the ER and that eventually healed according to the patient's daughter who is seen with her during the visit today. With that being said an area below the area that healed actually opened following and that is what we are seeing her for today. There actually appears to be hyper granular tissue in the base of the wound just on initial inspection. She does have some epithelial tissue over top of this hyper granulation but it is not really good epithelization. Everything underneath appears to be very fragile unfortunately. The patient does have a history of diabetes mellitus type 2, lymphedema, hypertension, and dementia. She was referred to [place] by her  primary care provider as the wound does not seem to be healing. Electronic Signature(s) Signed: 03/18/2019 5:46:53 PM By: Levan Hurst RN, BSN Signed: 03/20/2019 5:02:35 PM By: Worthy Keeler PA-C Previous Signature: 03/13/2019 6:48:11 PM Version By: Worthy Keeler PA-C Entered By: Levan Hurst on 03/18/2019 08:11:27 -------------------------------------------------------------------------------- Physical Exam Details Patient Name: Date of Service: [name], [name] 03/13/2019 10:30 AM Medical Record [id number] Patient Account Number: [id number] Date of Birth/Sex: Treating RN:  1935-05-18 (83 y.o. [name] Primary Care Provider: [name] Other Clinician: Referring Provider: Treating Provider/Extender:Stone III, Mardi Mainland, Loni Muse in Treatment: 0 Constitutional patient is hypertensive.. pulse regular and within target range for patient.Marland Kitchen respirations regular, non-labored and within target range for patient.Marland Kitchen temperature within target range for patient.. Well-nourished and well-hydrated in no acute distress. Eyes conjunctiva clear no eyelid edema noted. pupils equal round and reactive to light and accommodation. Ears, Nose, Mouth, and Throat no gross abnormality of ear auricles or external auditory canals. normal hearing noted during conversation. mucus membranes moist. Respiratory normal breathing without difficulty. clear to auscultation bilaterally. Cardiovascular regular rate and rhythm with normal S1, S2. 1+ dorsalis pedis/posterior tibialis pulses. 1+ pitting edema of the bilateral lower extremities. Gastrointestinal (GI) soft, non-tender, non-distended, +BS. no ventral hernia noted. Musculoskeletal normal gait and posture. no significant deformity or arthritic changes, no loss or range of motion, no clubbing. Psychiatric Patient is not able to cooperate in decision making regarding care. Patient has dementia. patient is confused. Notes Upon  inspection the patient's wound bed actually showed signs of hyper granulation with some epithelial tissue over top but again it does not appear to be very healthy at all unfortunately. With that being said I did discuss with the patient's daughter that I believe that undertaking debridement to clear away some of the hyper granular tissue would be beneficial for the patient in order to allow this area to more appropriately heal with better granulation. We would also utilize [facility] following in order to help prevent additional hyper granulation. In general she agreed with this and we did subsequently obtain verbal consent from her and proceed with the debridement today. I was able to perform debridement and the patient did not appear to have any significant pain which was good news. Post debridement I did utilize silver nitrate to chemically cauterize the additional hyper granular tissue while helping with hemostasis. Electronic Signature(s) Signed: 03/13/2019 6:48:11 PM By: Worthy Keeler PA-C Entered By: Worthy Keeler on 03/13/2019 18:43:03 -------------------------------------------------------------------------------- Physician Orders Details Patient Name: Date of Service: LAVELLA, SKRINE 03/13/2019 10:30 AM Medical Record [id number] Patient Account Number: [id number] Date of Birth/Sex: Treating RN: 11-07-1935 (83 y.o. Martyn Malay, Vaughan Basta Primary Care Provider: [name] Other Clinician: Referring Provider: Treating Provider/Extender:Stone III, Mardi Mainland, Loni Muse in Treatment: 0 Verbal / Phone Orders: No Diagnosis Coding ICD-10 Coding Code Description E11.622 Type 2 diabetes mellitus with other skin ulcer I89.0 Lymphedema, not elsewhere classified L97.812 Non-pressure chronic ulcer of other part of right lower leg with fat layer exposed I10 Essential (primary) hypertension F03.90 Unspecified dementia without behavioral disturbance Follow-up  Appointments Return Appointment in 1 week. Dressing Change Frequency Wound #1 Right,Lateral Knee Change dressing three times week. Wound Cleansing Wound #1 Right,Lateral Knee Clean wound with Wound Cleanser - or saline Primary Wound Dressing Wound #1 Right,Lateral Knee Hydrofera Blue - ready Secondary Dressing Wound #1 Right,Lateral Knee Kerlix/Rolled Gauze - may secure with rolled gauze as neded Foam Cannon Wound #1 [facility] skilled nursing for wound care. - [facility] X-[facility], tib/fib right 2 view - nonhealing ulcer right lateral lower leg - (ICD10 L97.812 - Non-pressure chronic ulcer of other part of right lower leg with fat layer exposed) Electronic Signature(s) Signed: 03/13/2019 6:42:58 PM By: Baruch Gouty RN, BSN Signed: 03/13/2019 6:48:11 PM By: Worthy Keeler PA-C Entered By: Baruch Gouty on 03/13/2019 12:51:42 -------------------------------------------------------------------------------- Prescription 03/13/2019 Patient Name: Thomasenia Bottoms Provider: Worthy Keeler PA Date  of Birth: [birth year] NPI#: [id number] Sex: F DEA#: [id number] Phone #: [phone] License #: Patient Address: [place] [id number] [place], [place] [address] [place], [place] [address] [phone] Allergies No Known Allergies Provider's Orders X-ray, tib/fib right 2 view - ICD10: L97.812 - nonhealing ulcer right lateral lower leg Signature(s): Date(s): Electronic Signature(s) Signed: 03/13/2019 6:42:58 PM By: Baruch Gouty RN, BSN Signed: 03/13/2019 6:48:11 PM By: Worthy Keeler PA-C Entered By: Baruch Gouty on 03/13/2019 12:51:42 --------------------------------------------------------------------------------  Problem List Details Patient Name: Date of Service: RETIA, BOWLEN 03/13/2019 10:30 AM Medical Record [id number] Patient Account Number:  [id number] Date of Birth/Sex: Treating RN: 14-Apr-1936 (83 y.o. [name] Primary Care Provider: [name] Other Clinician: Referring Provider: Treating Provider/Extender:Stone III, Mardi Mainland, Loni Muse in Treatment: 0 Active Problems ICD-10 Evaluated Encounter Code Description Active Date Today Diagnosis E11.622 Type 2 diabetes mellitus with other skin ulcer 03/13/2019 No Yes I89.0 Lymphedema, not elsewhere classified 03/13/2019 No Yes L97.812 Non-pressure chronic ulcer of other part of right lower 03/13/2019 No Yes leg with fat layer exposed [place] (primary) hypertension 03/13/2019 No Yes F03.90 Unspecified dementia without behavioral disturbance 03/13/2019 No Yes Inactive Problems Resolved Problems Electronic Signature(s) Signed: 03/13/2019 6:48:11 PM By: Worthy Keeler PA-C Entered By: Worthy Keeler on 03/13/2019 12:32:19 -------------------------------------------------------------------------------- Progress Note Details Patient Name: Date of Service: [name], [name] 03/13/2019 10:30 AM Medical Record [id number] Patient Account Number: [id number] Date of Birth/Sex: Treating RN: Jan 12, 1936 (83 y.o. [name] Primary Care Provider: [name] Other Clinician: Referring Provider: Treating Provider/Extender:Stone III, Mardi Mainland, Loni Muse in Treatment: 0 Subjective Chief Complaint Information obtained from Patient Right LE Ulcer History of Present Illness (HPI) 03/13/2019 patient presents today for initial evaluation in our clinic concerning an issue that is been present Really since June of this year. The patient has what appeared to have been an abscess which subsequently an IandD indeed was performed in the ER and that eventually healed according to the patient's daughter who is seen with her during the visit today. With that being said an area below the area that healed actually opened following and that is what we are  seeing her for today. There actually appears to be hyper granular tissue in the base of the wound just on initial inspection. She does have some epithelial tissue over top of this hyper granulation but it is not really good epithelization. Everything underneath appears to be very fragile unfortunately. The patient does have a history of diabetes mellitus type 2, lymphedema, hypertension, and dementia. She was referred to [place] by her primary care provider as the wound does not seem to be healing. Patient History Allergies No Known Allergies Family History Stroke - Siblings, No family history of Cancer, Diabetes, Heart Disease, Hereditary Spherocytosis, Hypertension, Kidney Disease, Lung Disease, Seizures, Thyroid Problems, Tuberculosis. Social History Never smoker, Marital Status - Divorced, Alcohol Use - Never, Drug Use - No History, Caffeine Use - Never. Medical History Eyes Denies history of Cataracts, Glaucoma, Optic Neuritis Ear/Nose/Mouth/Throat Denies history of Chronic sinus problems/congestion, Middle ear problems Hematologic/Lymphatic Denies history of Anemia, Hemophilia, Human Immunodeficiency Virus, Lymphedema, Sickle Cell Disease Respiratory Denies history of Aspiration, Asthma, Chronic Obstructive Pulmonary Disease (COPD), Pneumothorax, Sleep Apnea, Tuberculosis Cardiovascular Patient has history of Hypertension Denies history of Angina, Arrhythmia, Congestive Heart Failure, Coronary Artery Disease, Deep Vein Thrombosis, Hypotension, Myocardial Infarction, Peripheral Arterial Disease, Peripheral Venous Disease, Phlebitis, Vasculitis Gastrointestinal Denies history of Cirrhosis , Colitis,  Crohnoos, Hepatitis A, Hepatitis B, Hepatitis C Endocrine Patient has history of Type II Diabetes Denies history of Type I Diabetes Genitourinary Denies history of End Stage Renal Disease Immunological Denies history of Lupus Erythematosus, Raynaudoos, Scleroderma Integumentary  (Skin) Denies history of History of Burn Musculoskeletal Denies history of Gout, Rheumatoid Arthritis, Osteoarthritis, Osteomyelitis Neurologic Denies history of Dementia, Neuropathy, Quadriplegia, Paraplegia, Seizure Disorder Oncologic Denies history of Received Chemotherapy, Received Radiation Psychiatric Denies history of Anorexia/bulimia, Confinement Anxiety Patient is treated with Oral Agents. Blood sugar is not tested. Review of Systems (ROS) Constitutional Symptoms (General Health) Denies complaints or symptoms of Fatigue, Fever, Chills, Marked Weight Change. Eyes Denies complaints or symptoms of Dry Eyes, Vision Changes, Glasses / Contacts. Ear/Nose/Mouth/Throat Denies complaints or symptoms of Chronic sinus problems or rhinitis. Respiratory Denies complaints or symptoms of Chronic or frequent coughs, Shortness of Breath. Cardiovascular Denies complaints or symptoms of Chest pain. Gastrointestinal Denies complaints or symptoms of Frequent diarrhea, Nausea, Vomiting. Endocrine Denies complaints or symptoms of Heat/cold intolerance. Genitourinary Denies complaints or symptoms of Frequent urination. Integumentary (Skin) Complains or has symptoms of Wounds. Musculoskeletal Denies complaints or symptoms of Muscle Pain, Muscle Weakness. Neurologic Denies complaints or symptoms of Numbness/parasthesias. Psychiatric Denies complaints or symptoms of Claustrophobia, Suicidal. Objective Constitutional patient is hypertensive.. pulse regular and within target range for patient.Marland Kitchen respirations regular, non-labored and within target range for patient.Marland Kitchen temperature within target range for patient.. Well-nourished and well-hydrated in no acute distress. Vitals Time Taken: 11:42 AM, Height: 67 in, Source: Stated, Weight: 130 lbs, Source: Stated, BMI: 20.4, Temperature: 97.8 F, Pulse: 81 bpm, Respiratory Rate: 18 breaths/min, Blood Pressure: 171/85 mmHg. Eyes conjunctiva clear no  eyelid edema noted. pupils equal round and reactive to light and accommodation. Ears, Nose, Mouth, and Throat no gross abnormality of ear auricles or external auditory canals. normal hearing noted during conversation. mucus membranes moist. Respiratory normal breathing without difficulty. clear to auscultation bilaterally. Cardiovascular regular rate and rhythm with normal S1, S2. 1+ dorsalis pedis/posterior tibialis pulses. 1+ pitting edema of the bilateral lower extremities. Gastrointestinal (GI) soft, non-tender, non-distended, +BS. no ventral hernia noted. Musculoskeletal normal gait and posture. no significant deformity or arthritic changes, no loss or range of motion, no clubbing. Psychiatric Patient is not able to cooperate in decision making regarding care. Patient has dementia. patient is confused. General Notes: Upon inspection the patient's wound bed actually showed signs of hyper granulation with some epithelial tissue over top but again it does not appear to be very healthy at all unfortunately. With that being said I did discuss with the patient's daughter that I believe that undertaking debridement to clear away some of the hyper granular tissue would be beneficial for the patient in order to allow this area to more appropriately heal with better granulation. We would also utilize [facility] following in order to help prevent additional hyper granulation. In general she agreed with this and we did subsequently obtain verbal consent from her and proceed with the debridement today. I was able to perform debridement and the patient did not appear to have any significant pain which was good news. Post debridement I did utilize silver nitrate to chemically cauterize the additional hyper granular tissue while helping with hemostasis. Integumentary (Hair, Skin) Wound #1 status is Open. Original cause of wound was Trauma. The wound is located on the Right,Lateral Knee. The wound  measures 0.4cm length x 0.3cm width x 0.4cm depth; 0.094cm^2 area and 0.038cm^3 volume. There is Fat Layer (Subcutaneous Tissue) Exposed exposed. There  is no tunneling or undermining noted. There is a medium amount of serosanguineous drainage noted. There is large (67-100%) red granulation within the wound bed. There is a small (1-33%) amount of necrotic tissue within the wound bed including Adherent Slough. Assessment Active Problems ICD-10 Type 2 diabetes mellitus with other skin ulcer Lymphedema, not elsewhere classified Non-pressure chronic ulcer of other part of right lower leg with fat layer exposed Essential (primary) hypertension Unspecified dementia without behavioral disturbance Procedures Wound #1 Pre-procedure diagnosis of Wound #1 is a Diabetic Wound/Ulcer of the Lower Extremity located on the Right,Lateral Knee .Severity of Tissue Pre Debridement is: Fat layer exposed. There was a Excisional Skin/Subcutaneous Tissue Debridement with a total area of 0.9 sq cm performed by Worthy Keeler, PA. With the following instrument(s): Curette to remove Viable and Non-Viable tissue/material. Material removed includes Subcutaneous Tissue and Other: hypergranulation tissue after achieving pain control using Lidocaine 5% topical ointment. No specimens were taken. A time out was conducted at 12:35, prior to the start of the procedure. A Minimum amount of bleeding was controlled with Silver Nitrate. The procedure was tolerated well with a pain level of 3 throughout and a pain level of 2 following the procedure. Post Debridement Measurements: 1cm length x 0.9cm width x 0.4cm depth; 0.283cm^3 volume. Character of Wound/Ulcer Post Debridement is improved. Severity of Tissue Post Debridement is: Fat layer exposed. Post procedure Diagnosis Wound #1: Same as Pre-Procedure Plan Follow-up Appointments: Return Appointment in 1 week. Dressing Change Frequency: Wound #1 Right,Lateral Knee: Change  dressing three times week. Wound Cleansing: Wound #1 Right,Lateral Knee: Clean wound with Wound Cleanser - or saline Primary Wound Dressing: Wound #1 Right,Lateral Knee: Hydrofera Blue - ready Secondary Dressing: Wound #1 Right,Lateral Knee: Kerlix/Rolled Gauze - may secure with rolled gauze as neded Foam Border [facility]: Wound #1 Right,Lateral Knee: [facility] skilled nursing for wound care. - [facility] ordered were: X-ray, tib/fib right 2 view - nonhealing ulcer right lateral lower leg 1. I would recommend that we initiate treatment with a Hydrofera Blue dressing at this time and I do believe that this should do well for the patient. It will take some time still for this to heal we may have to perform additional debridement before all is said and done. 2. I recommend that we secure this with rolled gauze and use a bordered foam underneath the rolled gauze will help hold this in place that is what her daughter has found has done best for her. 3. We will go ahead and order an x-ray of the tibia/fibula 2 views in order to ensure there is nothing underlying in the bone that would indicate osteomyelitis. We will see patient back for reevaluation in 1 week here in the clinic. If anything worsens or changes patient will contact our office for additional recommendations. Electronic Signature(s) Signed: 03/18/2019 5:46:53 PM By: Levan Hurst RN, BSN Signed: 03/20/2019 5:02:35 PM By: Worthy Keeler PA-C Previous Signature: 03/13/2019 6:48:11 PM Version By: Worthy Keeler PA-C Entered By: Levan Hurst on 03/18/2019 08:11:36 -------------------------------------------------------------------------------- HxROS Details Patient Name: Date of Service: [name], [name] 03/13/2019 10:30 AM Medical Record [id number] Patient Account Number: [id number] Date of Birth/Sex: Treating RN: [birth year] (83 y.o. [name] Primary Care Provider: [name] Other  Clinician: Referring Provider: Treating Provider/Extender:Stone III, Mardi Mainland, Loni Muse in Treatment: 0 Constitutional Symptoms (General Health) Complaints and Symptoms: Negative for: Fatigue; Fever; Chills; Marked Weight Change Eyes Complaints and Symptoms: Negative for: Dry Eyes; Vision Changes; Glasses /  Contacts Medical History: Negative for: Cataracts; Glaucoma; Optic Neuritis Ear/Nose/Mouth/Throat Complaints and Symptoms: Negative for: Chronic sinus problems or rhinitis Medical History: Negative for: Chronic sinus problems/congestion; Middle ear problems Respiratory Complaints and Symptoms: Negative for: Chronic or frequent coughs; Shortness of Breath Medical History: Negative for: Aspiration; Asthma; Chronic Obstructive Pulmonary Disease (COPD); Pneumothorax; Sleep Apnea; Tuberculosis Cardiovascular Complaints and Symptoms: Negative for: Chest pain Medical History: Positive for: Hypertension Negative for: Angina; Arrhythmia; Congestive Heart Failure; Coronary Artery Disease; Deep Vein Thrombosis; Hypotension; Myocardial Infarction; Peripheral Arterial Disease; Peripheral Venous Disease; Phlebitis; Vasculitis Gastrointestinal Complaints and Symptoms: Negative for: Frequent diarrhea; Nausea; Vomiting Medical History: Negative for: Cirrhosis ; Colitis; Crohns; Hepatitis A; Hepatitis B; Hepatitis C Endocrine Complaints and Symptoms: Negative for: Heat/cold intolerance Medical History: Positive for: Type II Diabetes Negative for: Type I Diabetes Time with diabetes: 15 years Treated with: Oral agents Blood sugar tested every day: No Genitourinary Complaints and Symptoms: Negative for: Frequent urination Medical History: Negative for: End Stage Renal Disease Integumentary (Skin) Complaints and Symptoms: Positive for: Wounds Medical History: Negative for: History of Burn Musculoskeletal Complaints and Symptoms: Negative for: Muscle Pain; Muscle  Weakness Medical History: Negative for: Gout; Rheumatoid Arthritis; Osteoarthritis; Osteomyelitis Neurologic Complaints and Symptoms: Negative for: Numbness/parasthesias Medical History: Negative for: Dementia; Neuropathy; Quadriplegia; Paraplegia; Seizure Disorder Psychiatric Complaints and Symptoms: Negative for: Claustrophobia; Suicidal Medical History: Negative for: Anorexia/bulimia; Confinement Anxiety Hematologic/Lymphatic Medical History: Negative for: Anemia; Hemophilia; Human Immunodeficiency Virus; Lymphedema; Sickle Cell Disease Immunological Medical History: Negative for: Lupus Erythematosus; Raynauds; Scleroderma Oncologic Medical History: Negative for: Received Chemotherapy; Received Radiation Immunizations Pneumococcal Vaccine: Received Pneumococcal Vaccination: No Implantable Devices None Family and Social History Cancer: No; Diabetes: No; Heart Disease: No; Hereditary Spherocytosis: No; Hypertension: No; Kidney Disease: No; Lung Disease: No; Seizures: No; Stroke: Yes - Siblings; Thyroid Problems: No; Tuberculosis: No; Never smoker; Marital Status - Divorced; Alcohol Use: Never; Drug Use: No History; Caffeine Use: Never; Financial Concerns: No; Food, Clothing or Shelter Needs: No; Support System Lacking: No; Transportation Concerns: No Electronic Signature(s) Signed: 03/13/2019 6:48:11 PM By: Worthy Keeler PA-C Signed: 04/17/2019 12:05:18 PM By: Carlene Coria RN Entered By: Carlene Coria on 03/13/2019 11:48:04 -------------------------------------------------------------------------------- SuperBill Details Patient Name: Date of Service: [name], [name] 03/13/2019 Medical Record [id number] Patient Account Number: [id number] Date of Birth/Sex: Treating RN: 01/27/1936 (83 y.o. [name] Primary Care Provider: [name] Other Clinician: Referring Provider: Treating Provider/Extender:Stone III, Mardi Mainland, Loni Muse in Treatment:  0 Diagnosis Coding ICD-10 Codes Code Description E11.622 Type 2 diabetes mellitus with other skin ulcer I89.0 Lymphedema, not elsewhere classified L97.812 Non-pressure chronic ulcer of other part of right lower leg with fat layer exposed I10 Essential (primary) hypertension F03.90 Unspecified dementia without behavioral disturbance Facility Procedures CPT4 Code Description: [id number] - WOUND CARE VISIT-LEV 3 EST PT Modifier: 25 Quantity: 1 CPT4 Code Description: [id number] - DEB SUBQ TISSUE 20 SQ CM/< ICD-10 Diagnosis Description L97.812 Non-pressure chronic ulcer of other part of right lower leg Modifier: with fat layer Quantity: 1 exposed Physician Procedures CPT4 Code Description: [id number] - WC PHYS LEVEL 4 - NEW PT ICD-10 Diagnosis Description E11.622 Type 2 diabetes mellitus with other skin ulcer I89.0 Lymphedema, not elsewhere classified L97.812 Non-pressure chronic ulcer of other part of right lower  I10 Essential (primary) hypertension Modifier: 25 leg with fat lay Quantity: 1 er exposed CPT4 Code Description: [id number] 11042 - WC PHYS SUBQ TISS 20 SQ CM ICD-10 Diagnosis Description L97.812 Non-pressure chronic ulcer of other part of right lower leg Modifier:  with fat layer Quantity: 1 exposed Electronic Signature(s) Signed: 03/13/2019 6:48:11 PM By: Worthy Keeler PA-C Previous Signature: 03/13/2019 6:42:58 PM Version By: Baruch Gouty RN, BSN Previous Signature: 03/13/2019 6:42:58 PM Version By: Baruch Gouty RN, BSN Entered By: Worthy Keeler on 03/13/2019 18:44:04

## 2019-04-17 NOTE — Progress Notes (Signed)
Carolyn, Contreras ([id number]) Visit Report for 03/13/2019 Abuse/Suicide Risk Screen Details Patient Name: Date of Service: [name], [name] 03/13/2019 10:30 AM Medical Record [id number] Patient Account Number: [id number] Date of Birth/Sex: Treating RN: 17-Apr-1936 (83 y.o. [name] Primary Care Holli Rengel: [name] Other Clinician: Referring Gittel Mccamish: Treating Tre Sanker/Extender:Stone III, Mardi Mainland, Loni Muse in Treatment: 0 Abuse/Suicide Risk Screen Items Answer ABUSE RISK SCREEN: Has anyone close to you tried to hurt or harm you recentlyo No Do you feel uncomfortable with anyone in your familyo No Has anyone forced you do things that you didnt want to doo No Electronic Signature(s) Signed: 04/17/2019 12:05:18 PM By: Carlene Coria RN Entered By: Carlene Coria on 03/13/2019 11:48:12 -------------------------------------------------------------------------------- Activities of Daily Living Details Patient Name: Date of Service: Carolyn, Contreras 03/13/2019 10:30 AM Medical Record [id number] Patient Account Number: [id number] Date of Birth/Sex: Treating RN: [birth year] (83 y.o. [name] Primary Care Innocence Schlotzhauer: [name] Other Clinician: Referring Kajol Crispen: Treating Shaughnessy Gethers/Extender:Stone III, Mardi Mainland, Loni Muse in Treatment: 0 Activities of Daily Living Items Answer Activities of Daily Living (Please select one for each item) Drive Automobile Not Able Take Medications Not Able Use Telephone Not Able Care for Appearance Not Able Use Toilet Not Able Manus Rudd / Shower Not Able Dress Self Not Able Feed Self Not Able Walk Not Able Get In / Out Bed Not [place] for Self Not Able Electronic Signature(s) Signed: 04/17/2019 12:05:18 PM By: Carlene Coria RN Entered By: Carlene Coria on 03/13/2019  11:48:41 -------------------------------------------------------------------------------- Education Screening Details Patient Name: Date of Service: Carolyn, Contreras 03/13/2019 10:30 AM Medical Record [id number] Patient Account Number: [id number] Date of Birth/Sex: Treating RN: 1936/04/10 (83 y.o. [name] Primary Care Niaja Stickley: [name] Other Clinician: Referring Yarimar Lavis: Treating Cozette Braggs/Extender:Stone III, Mardi Mainland, Loni Muse in Treatment: 0 Primary Learner Assessed: Caregiver Reason Patient is not Primary Learner: dementia Learning Preferences/Education Level/Primary Language Learning Preference: Explanation Highest Education Level: College or Above Preferred Language: English Cognitive Barrier Language Barrier: No Translator Needed: No Memory Deficit: No Emotional Barrier: No Cultural/Religious Beliefs Affecting Medical Care: No Physical Barrier Impaired Vision: Yes Glasses Impaired Hearing: No Decreased Hand dexterity: No Knowledge/Comprehension Knowledge Level: High Comprehension Level: High Ability to understand written High instructions: Ability to understand verbal High instructions: Motivation Anxiety Level: Calm Cooperation: Cooperative Education Importance: Acknowledges Need Interest in Health Problems: Asks Questions Perception: Coherent Willingness to Engage in Self- High Management Activities: Readiness to Engage in Self- High Management Activities: Electronic Signature(s) Signed: 04/17/2019 12:05:18 PM By: Carlene Coria RN Entered By: Carlene Coria on 03/13/2019 11:49:41 -------------------------------------------------------------------------------- Fall Risk Assessment Details Patient Name: Date of Service: Carolyn, Contreras 03/13/2019 10:30 AM Medical Record [id number] Patient Account Number: [id number] Date of Birth/Sex: Treating RN: Jun 21, 1935 (83 y.o. [name] Primary Care Quana Chamberlain: [name] Other Clinician: Referring Savilla Turbyfill: Treating Kimiyo Carmicheal/Extender:Stone III, Mardi Mainland, Loni Muse in Treatment: 0 Fall Risk Assessment Items Have you had 2 or more falls in the last 12 monthso 0 No Have you had any fall that resulted in injury in the last 12 monthso 0 No FALLS RISK SCREEN History of falling - immediate or within 3 months 0 No Secondary diagnosis (Do you have 2 or more medical diagnoseso) 0 No Ambulatory aid None/bed rest/wheelchair/nurse 0 No Crutches/cane/walker 0 No Furniture 0 No Intravenous therapy Access/Saline/Heparin Lock 0 No Weak (short steps with or without shuffle, stooped but able to lift head 0 No while walking, may seek support from furniture)  Impaired (short steps with shuffle, may have difficulty arising from chair, 0 No head down, impaired balance) Mental Status Oriented to own ability 0 No Overestimates or forgets limitations 0 No Risk Level: Low Risk Score: 0 Electronic Signature(s) Signed: 04/17/2019 12:05:18 PM By: Carlene Coria RN Entered By: Carlene Coria on 03/13/2019 11:49:52 -------------------------------------------------------------------------------- Foot Assessment Details Patient Name: Date of Service: Carolyn, Contreras 03/13/2019 10:30 AM Medical Record [id number] Patient Account Number: [id number] Date of Birth/Sex: Treating RN: 11-Jul-1935 (83 y.o. [name] Primary Care Jarrett Albor: [name] Other Clinician: Referring Evon Lopezperez: Treating Maddock Finigan/Extender:Stone III, Mardi Mainland, Loni Muse in Treatment: 0 Foot Assessment Items [x]  Unable to perform due to altered mental status Site Locations + = Sensation present, - = Sensation absent, C = Callus, U = Ulcer R = Redness, W = Warmth, M = Maceration, PU = Pre-ulcerative lesion F = Fissure, S = Swelling, D = Dryness Assessment Right: Left: Other Deformity: No No Prior Foot Ulcer: No No Prior Amputation: No No Charcot Joint: No No Ambulatory  Status: Gait: Electronic Signature(s) Signed: 04/17/2019 12:05:18 PM By: Carlene Coria RN Entered By: Carlene Coria on 03/13/2019 11:50:37 -------------------------------------------------------------------------------- Nutrition Risk Screening Details Patient Name: Date of Service: Carolyn, Contreras 03/13/2019 10:30 AM Medical Record [id number] Patient Account Number: [id number] Date of Birth/Sex: Treating RN: Nov 25, 1935 (83 y.o. [name] Primary Care Mischell Branford: [name] Other Clinician: Referring Antar Milks: Treating Treyshawn Muldrew/Extender:Stone III, Mardi Mainland, Loni Muse in Treatment: 0 Height (in): 67 Weight (lbs): 130 Body Mass Index (BMI): 20.4 Nutrition Risk Screening Items Score Screening NUTRITION RISK SCREEN: I have an illness or condition that made me change the kind and/or 0 No amount of food I eat I eat fewer than two meals per day 0 No I eat few fruits and vegetables, or milk products 0 No I have three or more drinks of beer, liquor or wine almost every day 0 No I have tooth or mouth problems that make it hard for me to eat 0 No I don't always have enough money to buy the food I need 0 No I eat alone most of the time 0 No I take three or more different prescribed or over-the-counter drugs a day 1 Yes 0 No Without wanting to, I have lost or gained 10 pounds in the last six months I am not always physically able to shop, cook and/or feed myself 2 Yes Nutrition Protocols Good Risk Protocol Provide education on Moderate Risk Protocol 0 nutrition High Risk Proctocol Risk Level: Moderate Risk Score: 3 Electronic Signature(s) Signed: 04/17/2019 12:05:18 PM By: Carlene Coria RN Entered By: Carlene Coria on 03/13/2019 11:50:16

## 2019-04-18 ENCOUNTER — Other Ambulatory Visit: Payer: Medicare Other | Admitting: *Deleted

## 2019-04-18 ENCOUNTER — Other Ambulatory Visit: Payer: Self-pay

## 2019-04-18 DIAGNOSIS — Z515 Encounter for palliative care: Secondary | ICD-10-CM

## 2019-04-18 NOTE — Progress Notes (Signed)
COMMUNITY PALLIATIVE CARE RN NOTE  PATIENT NAME: [name] DOB: 1935/12/17 MRN: [id number]  PRIMARY CARE PROVIDER: [name], NP  RESPONSIBLE PARTY:  Acct ID - Guarantor Home Phone Work Phone Relationship Acct Type  [id number] STEPHNEY, [phone]  Self P/F     [address], [place], [place] [address]   Due to the COVID-19 crisis, this virtual check-in visit was done via telephone from my office and it was initiated and consent by this patient and or family.  PLAN OF CARE and INTERVENTION:  1. ADVANCE CARE PLANNING/GOALS OF CARE: Goal is for patient to remain in the home with her family and avoid hospitalizations. 2. PATIENT/CAREGIVER EDUCATION: Symptom Management 3. DISEASE STATUS: Virtual check-in visit completed via telephone. Patient is currently sitting on the couch with her feet elevated, watching tv. Patient's daughter-in-law [name] provided patient update. She reports that patient's condition has been stable over the past few weeks. Her blood sugars and blood pressures have also been more stable. No changes in patient's medications have been made recently. Her appetite is good. Denies recent issues with dysphagia. She has been having some issues with constipation. Her LBM was 04/13/19. They initially had been giving patient 1/2 capful of Miralax daily, but has increased to a full dose today. She says that her abdomen does not appear to be distended and no c/o discomfort. Prior to this, BMs had been more regular, averaging every 2-3 days with 1/2 dose. Patient remains total care with ADLs. A hoyer lift was recently delivered and education and demonstration of use provided by [facility]. [name] has a surgery coming up next week on her shoulder, and feels this may be helpful with transfers since she will be unable to assist while in her recovery phase. She continues with hired caregivers 7 days/week to assist with personal care. She has been discharged from the Wound center  since her leg wound has now almost completely healed. There is a small area noted, [name] says that is not open and only a bandaid has been required. She continues with some skin breakdown to her bottom, which is treated weekly by a home health RN through [facility]. She says that her feet have been less swollen and they are trying to change her positions frequently throughout the day. Will continue to monitor.   HISTORY OF PRESENT ILLNESS:  This is a 83 yo female who resides in the home with her son and daughter-in-law. Palliative care team continues to follow patient. Next visit scheduled in 1 month.   CODE STATUS: DNR ADVANCED DIRECTIVES: Y MOST FORM: no PPS: 30%   (Duration of visit and documentation 60 minutes)   Daryl Eastern, RN BSN

## 2019-04-19 ENCOUNTER — Telehealth: Payer: Self-pay | Admitting: *Deleted

## 2019-04-19 NOTE — Telephone Encounter (Signed)
Nurse calling stating that pt has a blood pressure of 199/90 ( pt had not taken meds yet) she also wanted to advise that pt has been throwing up for 2 night and that she couldn't access her lungs, she didn't say why?

## 2019-04-19 NOTE — Telephone Encounter (Signed)
Spoke with nurse and pt's family wanted pt to eat before she take her medication, she will text the family for a updated BP. Pt is eating during the day but only 1 episode of vomiting at night for 2 nights. No diarrhea or abdominal discomfort.

## 2019-04-19 NOTE — Telephone Encounter (Signed)
Spoke with nurse and advised results  

## 2019-04-19 NOTE — Telephone Encounter (Signed)
Okay if she is able to keep food down during the day and only 1 episode will monitor. Make sure she is sitting upright when eating meals and not to lay down for at least an hour after she eats.  To notify if intake decreases, vomiting persist

## 2019-04-19 NOTE — Telephone Encounter (Signed)
Did blood pressure improved once medication was administered? Need more information regarding the vomiting? Has she been able to keep food down? Is the vomiting single episodes just in the evening? Any fevers? Signs of Abdominal discomfort or diarrhea?

## 2019-04-22 ENCOUNTER — Encounter: Payer: Self-pay | Admitting: Family

## 2019-04-22 ENCOUNTER — Other Ambulatory Visit: Payer: Self-pay

## 2019-04-22 ENCOUNTER — Ambulatory Visit (INDEPENDENT_AMBULATORY_CARE_PROVIDER_SITE_OTHER): Payer: Medicare Other | Admitting: Family

## 2019-04-22 DIAGNOSIS — Z20828 Contact with and (suspected) exposure to other viral communicable diseases: Secondary | ICD-10-CM

## 2019-04-22 DIAGNOSIS — Z20822 Contact with and (suspected) exposure to covid-19: Secondary | ICD-10-CM

## 2019-04-22 NOTE — Progress Notes (Signed)
This service is provided via telemedicine  No vital signs collected/recorded due to the encounter was a telemedicine visit.   Location of patient (ex: home, work):  Home   Patient consents to a telephone visit:  Yes  Location of the provider (ex: office, home):  Office   Name of any referring provider:  [name], NP  Names of all persons participating in the telemedicine service and their role in the encounter:  Tasnia Spegal NP, Ruthell Rummage CMA, patient and daughter in law  Time spent on call:  Ruthell Rummage CMA, spent 9 minutes on phone with patient    Provider: Saeed Toren FNP-C  [name], NP  Patient Care Team: [name], NP as PCP - General (Geriatric Medicine) [name], MD as Consulting Physician (Oncology) [name], [name], LCSW as Social Worker (Licensed Clinical Social Worker) [name], RN as Registered Nurse [facility] and Palliative Medicine)  Extended Emergency Contact Information Primary Emergency Contact: Shontz,[name] Address: [address]          [place], [place] [address] Johnnette Litter of [place] Phone: [phone] Mobile Phone: [phone] Relation: Relative Secondary Emergency Contact: Cottie Banda Address: [address]          [place], [place] [address] Johnnette Litter of [place] Phone: [phone] Relation: Son  Code Status:  DNR Goals of care: Advanced Directive information Advanced Directives 11/02/2018  Does Patient Have a Medical Advance Directive? Yes  Type of Paramedic of [place];Living will  Does patient want to make changes to medical advance directive? -  Copy of [place] in Chart? -     Chief Complaint  Patient presents with  . Acute Visit    Postive Exposure and would like to be tested, [name] states patient is not having any signs or symptoms at this time. Wanted to see if she could get tested at home.     HPI:  Pt is a 83  y.o. female seen today for an acute visit for possible exposure to COVID-19.Care giver states her husband tested positive for COVID-19 and symptoms last week but now doing much better.Care giver states was not wearing a mask last week around patient but now wearing a mask.both patient and care giver have no symptoms.Care giver wanted to see if patient can be tested at home without going out. Patient requires two people to transfer.Does not want to take her out if possible.I've discuss with care giver that currently there's no COVID-19 testing at home recommended taking patient to drive through testing center.Care giver declines will get back to [facility] office if anything changes or take patient to ED if care giver COVID-19 test comes back positive.    Past Medical History:  Diagnosis Date  . Dementia ([place])   . Diabetes mellitus type II 03/21/2011  . DVT (deep venous thrombosis) ([place]) 03/21/2011  . Hyperlipidemia 03/21/2011  . Hypertension 03/21/2011  . Lymphedema of leg 03/21/2011  . Pulmonary embolism ([place]) 03/21/2011  . Stroke [facility])    Past Surgical History:  Procedure Laterality Date  . CHOLECYSTECTOMY  2015  . SKIN TAG REMOVAL  07/25/2016  . VAGINAL HYSTERECTOMY     unknown of date  . VASCULAR SURGERY      No Known Allergies  Outpatient Encounter Medications as of 04/22/2019  Medication Sig  . Accu-Chek Softclix Lancets lancets 1 each by Other route 2 (two) times daily. Use as instructed Dx: E11.9  . acetaminophen (TYLENOL) 500 MG tablet  Take 500 mg by mouth as needed (general pain).   Marland Kitchen atorvastatin (LIPITOR) 20 MG tablet Take 1 tablet (20 mg total) by mouth daily at 6 PM.  . Cholecalciferol (VITAMIN D-3) 125 MCG (5000 UT) TABS Take 5,000 Units by mouth daily.  . cloNIDine (CATAPRES) 0.1 MG tablet Take 1 tablet (0.1 mg total) by mouth 2 (two) times daily. May take an additional tablet if needed SBP >170  . collagenase (SANTYL) ointment Apply to dark skin area every 3 days  .  diclofenac sodium (VOLTAREN) 1 % GEL Apply 1 application topically 2 (two) times daily as needed (pain).   Marland Kitchen gabapentin (NEURONTIN) 300 MG capsule Take 1 capsule (300 mg total) by mouth 2 (two) times daily.  Marland Kitchen glucose blood (ACCU-CHEK AVIVA PLUS) test strip 1 each by Other route 2 (two) times daily. E11.9  . liver oil-zinc oxide (DESITIN) 40 % ointment Apply 1 application topically as needed for irritation.  Marland Kitchen losartan (COZAAR) 25 MG tablet Take 1 tablet (25 mg total) by mouth daily. Take along with 50 mg to equal 75  . losartan (COZAAR) 50 MG tablet Take 1 tablet (50 mg total) by mouth daily. Take 1 along with 25 mg to equal 75 mg total  . metFORMIN (GLUCOPHAGE) 500 MG tablet Take 1000 mg (2 tabs) by mouth in the am, and take 1 tab (500 mg)   by mouth in the evening.  . polyethylene glycol (MIRALAX / GLYCOLAX) packet Take 17 g by mouth daily.   . Probiotic Product (PROBIOTIC PO) Take 1 capsule by mouth daily.  . Rivaroxaban (XARELTO) 15 MG TABS tablet Take 1 tablet (15 mg total) by mouth daily with supper.  Marland Kitchen UNABLE TO FIND Med Name: Portable Chest X-ray 2 views to rule Aspiration Pneumonia due to cough during meals  . Vitamins A & D (VITAMIN A & D) ointment Apply 1 application topically as needed for dry skin.  . Wound Dressings (MEPILEX EX) Apply to sacrum and change every day  as needed   No facility-administered encounter medications on file as of 04/22/2019.    Review of Systems  Constitutional: Negative for appetite change, chills, fatigue and fever.  HENT: Negative for congestion, rhinorrhea, sinus pressure, sinus pain, sneezing, sore throat and trouble swallowing.   Eyes: Negative for pain, discharge, redness and itching.  Respiratory: Negative for cough, chest tightness, shortness of breath and wheezing.   Cardiovascular: Negative for chest pain, palpitations and leg swelling.  Gastrointestinal: Negative for abdominal distention, abdominal pain, constipation, diarrhea, nausea and  vomiting.  Genitourinary: Negative for decreased urine volume, difficulty urinating, dysuria, flank pain, frequency and urgency.  Musculoskeletal: Positive for arthralgias and gait problem. Negative for myalgias.  Skin: Negative for color change, pallor and rash.  Neurological: Negative for dizziness, light-headedness and headaches.  Psychiatric/Behavioral: Negative for sleep disturbance. The patient is not nervous/anxious.      There is no immunization history on file for this patient. Pertinent  Health Maintenance Due  Topic Date Due  . FOOT EXAM  08/08/1947  . OPHTHALMOLOGY EXAM  01/24/2018  . PNA vac Low Risk Adult (1 of 2 - PCV13) 08/21/2019 (Originally 08/08/2002)  . HEMOGLOBIN A1C  08/26/2019  . DEXA SCAN  Completed  . INFLUENZA VACCINE  Discontinued   Fall Risk  01/28/2019 12/04/2018 11/12/2018 11/05/2018 10/24/2018  Falls in the past year? 1 0 0 0 1  Number falls in past yr: 0 0 0 0 0  Comment - - - - -  Injury  with Fall? 0 0 0 0 0  Risk Factor Category  - - - - -  Risk for fall due to : - - - - -  Follow up - - - - -   There were no vitals filed for this visit. There is no height or weight on file to calculate BMI. Physical Exam  Unable to complete on telephone visit.   Labs reviewed: Recent Labs    01/11/19 2004 01/27/19 1636 02/25/19 1518  NA 139 140 140  K 3.9 4.0 4.6  CL 101 100 103  CO2 25 25 26   GLUCOSE 198* 179* 280*  BUN 23 20 27*  CREATININE 0.49 0.43* 0.43*  CALCIUM 9.4 9.3 9.3   Recent Labs    09/03/18 1143 10/29/18 1034 01/11/19 2004 01/27/19 1636  AST 11* 11 16 17   ALT 9 9 14 12   ALKPHOS 71  --  76 65  BILITOT 0.4 0.4 0.6 0.1*  PROT 7.5 6.3 7.5 6.0*  ALBUMIN 4.1  --  3.4* 3.0*   Recent Labs    01/11/19 2004 01/27/19 1636 02/25/19 1518  WBC 6.5 4.0 4.1  NEUTROABS 4.0 2.8 2,341  HGB 12.3 10.4* 10.8*  HCT 40.1 34.1* 33.6*  MCV 95.0 95.3 91.3  PLT 283 245 207   Lab Results  Component Value Date   TSH 2.39 01/10/2017   Lab  Results  Component Value Date   HGBA1C 7.9 (H) 02/25/2019   Lab Results  Component Value Date   CHOL 173 09/03/2018   HDL 52 09/03/2018   LDLCALC 103 (H) 09/03/2018   TRIG 90 09/03/2018   CHOLHDL 3.3 09/03/2018    Significant Diagnostic Results in last 30 days:  No results found.  Assessment/Plan  Exposure to COVID-19 virus Afebrile.Reports no symptoms.Care giver exposed to husband who was symptomatic with COVID-19 positive.Request in home testing but none available to currently.Will notify provider if drive through [id number] testing is desired.Advised her to take patient to ED if she develops shortness of breath ,wheezing,chest pain or decline in condition.Encouraged to continue with CDC guidelines  Wearing facial mask.hand hygiene and social distancing.   Family/ staff Communication: Reviewed plan of care with patient.  Labs/tests ordered: Drive through [id number] testing declined.  Spent 11 minutes of non-face to face with patient    Sandrea Hughs, NP

## 2019-04-22 NOTE — Patient Instructions (Signed)
This information is directly available on the CDC website: https://www.cdc.gov/coronavirus/2019-ncov/if-you-are-sick/steps-when-sick.html    Source:CDC Reference to specific commercial products, manufacturers, companies, or trademarks does not constitute its endorsement or recommendation by the U.S. Government, Department of Health and Human Services, or Centers for Disease Control and Prevention.  

## 2019-04-24 ENCOUNTER — Other Ambulatory Visit: Payer: Self-pay | Admitting: *Deleted

## 2019-04-24 MED ORDER — CLONIDINE HCL 0.1 MG PO TABS: 0.1000 mg | ORAL_TABLET | Freq: Two times a day (BID) | ORAL | 0 refills | Status: DC

## 2019-04-24 NOTE — Telephone Encounter (Signed)
[name] called and stated that patient has not received the Clonidine mail order yet and needs local rx sent because patient is out. Faxed.

## 2019-04-25 ENCOUNTER — Telehealth: Payer: Self-pay | Admitting: *Deleted

## 2019-04-25 DIAGNOSIS — Z7984 Long term (current) use of oral hypoglycemic drugs: Secondary | ICD-10-CM | POA: Diagnosis not present

## 2019-04-25 DIAGNOSIS — I890 Lymphedema, not elsewhere classified: Secondary | ICD-10-CM

## 2019-04-25 DIAGNOSIS — Z86718 Personal history of other venous thrombosis and embolism: Secondary | ICD-10-CM

## 2019-04-25 DIAGNOSIS — L89153 Pressure ulcer of sacral region, stage 3: Secondary | ICD-10-CM | POA: Diagnosis not present

## 2019-04-25 DIAGNOSIS — F0390 Unspecified dementia without behavioral disturbance: Secondary | ICD-10-CM | POA: Diagnosis not present

## 2019-04-25 DIAGNOSIS — Z86711 Personal history of pulmonary embolism: Secondary | ICD-10-CM

## 2019-04-25 DIAGNOSIS — E1142 Type 2 diabetes mellitus with diabetic polyneuropathy: Secondary | ICD-10-CM | POA: Diagnosis not present

## 2019-04-25 DIAGNOSIS — Z8673 Personal history of transient ischemic attack (TIA), and cerebral infarction without residual deficits: Secondary | ICD-10-CM | POA: Diagnosis not present

## 2019-04-25 DIAGNOSIS — R1312 Dysphagia, oropharyngeal phase: Secondary | ICD-10-CM | POA: Diagnosis not present

## 2019-04-25 DIAGNOSIS — I10 Essential (primary) hypertension: Secondary | ICD-10-CM

## 2019-04-25 DIAGNOSIS — Z7901 Long term (current) use of anticoagulants: Secondary | ICD-10-CM | POA: Diagnosis not present

## 2019-04-25 NOTE — Telephone Encounter (Signed)
Noted thank you

## 2019-04-25 NOTE — Telephone Encounter (Signed)
Patient daughter in law, [name] called and stated she and her mother (that lives with her also) have tested POSITIVE for COVID 13. Stated that she is setting up an appointment for [facility] to be tested also. Stated that patient is more lethargic today. No temp and O2 99%. Nurse came in yesterday and coming back tomorrow. Stated she just wanted to let you know.

## 2019-04-26 ENCOUNTER — Other Ambulatory Visit: Payer: Self-pay

## 2019-04-26 ENCOUNTER — Emergency Department (HOSPITAL_COMMUNITY): Payer: Medicare Other

## 2019-04-26 ENCOUNTER — Emergency Department (HOSPITAL_COMMUNITY)
Admission: EM | Admit: 2019-04-26 | Discharge: 2019-04-27 | Disposition: A | Discharge status: Home / Self Care | Payer: Medicare Other | Attending: Emergency Medicine | Admitting: Emergency Medicine

## 2019-04-26 DIAGNOSIS — E119 Type 2 diabetes mellitus without complications: Secondary | ICD-10-CM | POA: Insufficient documentation

## 2019-04-26 DIAGNOSIS — B370 Candidal stomatitis: Secondary | ICD-10-CM | POA: Diagnosis not present

## 2019-04-26 DIAGNOSIS — I10 Essential (primary) hypertension: Secondary | ICD-10-CM | POA: Insufficient documentation

## 2019-04-26 DIAGNOSIS — Z8673 Personal history of transient ischemic attack (TIA), and cerebral infarction without residual deficits: Secondary | ICD-10-CM | POA: Insufficient documentation

## 2019-04-26 DIAGNOSIS — Z79899 Other long term (current) drug therapy: Secondary | ICD-10-CM | POA: Diagnosis not present

## 2019-04-26 DIAGNOSIS — U071 COVID-19: Secondary | ICD-10-CM

## 2019-04-26 DIAGNOSIS — Z86718 Personal history of other venous thrombosis and embolism: Secondary | ICD-10-CM | POA: Insufficient documentation

## 2019-04-26 DIAGNOSIS — Z7901 Long term (current) use of anticoagulants: Secondary | ICD-10-CM | POA: Insufficient documentation

## 2019-04-26 DIAGNOSIS — Z86711 Personal history of pulmonary embolism: Secondary | ICD-10-CM | POA: Diagnosis not present

## 2019-04-26 DIAGNOSIS — R638 Other symptoms and signs concerning food and fluid intake: Secondary | ICD-10-CM | POA: Diagnosis present

## 2019-04-26 DIAGNOSIS — Z7984 Long term (current) use of oral hypoglycemic drugs: Secondary | ICD-10-CM | POA: Diagnosis not present

## 2019-04-26 DIAGNOSIS — N390 Urinary tract infection, site not specified: Secondary | ICD-10-CM | POA: Diagnosis not present

## 2019-04-26 DIAGNOSIS — R479 Unspecified speech disturbances: Secondary | ICD-10-CM | POA: Insufficient documentation

## 2019-04-26 LAB — URINALYSIS, ROUTINE W REFLEX MICROSCOPIC
Bilirubin Urine: NEGATIVE
Glucose, UA: NEGATIVE mg/dL
Ketones, ur: NEGATIVE mg/dL
Nitrite: NEGATIVE
Protein, ur: 100 mg/dL — AB
Specific Gravity, Urine: 1.02 (ref 1.005–1.030)
WBC, UA: >50 WBC/hpf — ABNORMAL HIGH (ref 0–5)
pH: 5 (ref 5.0–8.0)

## 2019-04-26 LAB — CBC WITH DIFFERENTIAL/PLATELET
Abs Immature Granulocytes: 0.01 10*3/uL (ref 0.00–0.07)
Basophils Absolute: 0 10*3/uL (ref 0.0–0.1)
Basophils Relative: 0 %
Eosinophils Absolute: 0 10*3/uL (ref 0.0–0.5)
Eosinophils Relative: 1 %
HCT: 38.1 % (ref 36.0–46.0)
Hemoglobin: 11.6 g/dL — ABNORMAL LOW (ref 12.0–15.0)
Immature Granulocytes: 0 %
Lymphocytes Relative: 31 %
Lymphs Abs: 1.2 10*3/uL (ref 0.7–4.0)
MCH: 28.7 pg (ref 26.0–34.0)
MCHC: 30.4 g/dL (ref 30.0–36.0)
MCV: 94.3 fL (ref 80.0–100.0)
Monocytes Absolute: 0.6 10*3/uL (ref 0.1–1.0)
Monocytes Relative: 15 %
Neutro Abs: 2.1 10*3/uL (ref 1.7–7.7)
Neutrophils Relative %: 53 %
Platelets: 174 10*3/uL (ref 150–400)
RBC: 4.04 MIL/uL (ref 3.87–5.11)
RDW: 13.2 % (ref 11.5–15.5)
WBC: 4 10*3/uL (ref 4.0–10.5)
nRBC: 0 % (ref 0.0–0.2)

## 2019-04-26 LAB — COMPREHENSIVE METABOLIC PANEL
ALT: 14 U/L (ref 0–44)
AST: 18 U/L (ref 15–41)
Albumin: 3.6 g/dL (ref 3.5–5.0)
Alkaline Phosphatase: 66 U/L (ref 38–126)
Anion gap: 12 (ref 5–15)
BUN: 20 mg/dL (ref 8–23)
CO2: 27 mmol/L (ref 22–32)
Calcium: 9.4 mg/dL (ref 8.9–10.3)
Chloride: 96 mmol/L — ABNORMAL LOW (ref 98–111)
Creatinine, Ser: 0.4 mg/dL — ABNORMAL LOW (ref 0.44–1.00)
GFR calc Af Amer: >60 mL/min (ref >60)
GFR calc non Af Amer: >60 mL/min (ref >60)
Glucose, Bld: 130 mg/dL — ABNORMAL HIGH (ref 70–99)
Potassium: 4.1 mmol/L (ref 3.5–5.1)
Sodium: 135 mmol/L (ref 135–145)
Total Bilirubin: 0.5 mg/dL (ref 0.3–1.2)
Total Protein: 6.6 g/dL (ref 6.5–8.1)

## 2019-04-26 LAB — LACTIC ACID, PLASMA
Lactic Acid, Venous: 1.9 mmol/L (ref 0.5–1.9)
Lactic Acid, Venous: 1.6 mmol/L (ref 0.5–1.9)

## 2019-04-26 LAB — TROPONIN I (HIGH SENSITIVITY)
Troponin I (High Sensitivity): 7 ng/L (ref <18)
Troponin I (High Sensitivity): 7 ng/L (ref <18)

## 2019-04-26 LAB — POC SARS CORONAVIRUS 2 AG -  ED: SARS Coronavirus 2 Ag: POSITIVE — AB

## 2019-04-26 MED ORDER — SODIUM CHLORIDE 0.9 % IV BOLUS
500.0000 mL | Freq: Once | INTRAVENOUS | Status: AC
  Administered 2019-04-26: 500 mL via INTRAVENOUS

## 2019-04-26 MED ORDER — NYSTATIN 100000 UNIT/ML MT SUSP: 500000.0000 [IU] | Freq: Four times a day (QID) | OROMUCOSAL | 0 refills | Status: DC

## 2019-04-26 MED ORDER — CEPHALEXIN 500 MG PO CAPS: 500.0000 mg | ORAL_CAPSULE | Freq: Three times a day (TID) | ORAL | 0 refills | Status: DC

## 2019-04-26 MED ORDER — SODIUM CHLORIDE 0.9 % IV SOLN
1.0000 g | Freq: Once | INTRAVENOUS | Status: AC
  Administered 2019-04-26: 1 g via INTRAVENOUS
  Filled 2019-04-26: qty 10

## 2019-04-26 NOTE — Discharge Instructions (Signed)
Your Coronavirus test is positive.  Keep yourself isolated and quarantined for 2 weeks starting from today.  Take the medication for thrush as well as possible urinary tract infection.  Ritta Slot may be contributing to her difficulty with swallowing.  Return to the ED with difficulty breathing, mental status change, chest pain, vomiting, any other concerns.

## 2019-04-26 NOTE — ED Triage Notes (Signed)
Pt BIBA from home.   Per EMS- Family reports poor PO intake x3 days.  Pt less verbally responsive than usual.  Family reports pt appears to have lost weight.  Family reports 3 people in same residence have been dx with COVID-19 within the last week.  Pt has hx of dementia. Pt has BLLE lymphedema.

## 2019-04-26 NOTE — Telephone Encounter (Signed)
Called and discussed provider's response with [name]. She verbalized understanding.

## 2019-04-26 NOTE — Telephone Encounter (Signed)
Home health services are very limited at this time. The family can always pay privately for a caregiver if needed.

## 2019-04-26 NOTE — Telephone Encounter (Signed)
[name] called back today to inquire about home health agencies that could possibly come out and assist patient more. She states several people in house hold have tested positive for COVID and patient would be getting tested tomorrow. She states patient's vitals have been good but she has seemed more lethargic as yesterday. She would like to reach out to provider and ask if there would be anyway to get someone to come out more than 1 time a week to check on patient and possibly assist with bathing and other things. She states she feels patient needs more care than just 1 time a week.   Routing to provider to advise.

## 2019-04-26 NOTE — ED Notes (Signed)
Pt tolerated water for PO challenge 

## 2019-04-26 NOTE — ED Notes (Signed)
Pt unable to ambulate to check pulse ox due to not being able to ambulate at baseline

## 2019-04-26 NOTE — ED Provider Notes (Signed)
[facility] DEPT Provider Note   CSN: [id number] Arrival date & time: 04/26/19  1759     History Chief Complaint  Patient presents with  . Failure To Thrive    Nydia Iorio is a 83 y.o. female.  Level 5 caveat for dementia.  Patient brought in from home with decreased activity, decreased p.o. intake, decreased appetite for the past several days.  Family reports multiple people who tested positive for coronavirus the patient herself has not been tested.  Patient unable to give any history.  Discussed with patient's daughter-in-law [name] by phone.  [name] reports patient has not been eating or drinking very well and seems more confused than usual.  She has had more difficulty with swallowing and trying to feed herself than normal.  Patient is concerned that she may have lost weight.  She feels like she is not taking care of herself and having trouble getting food intake again.  There has been no reported vomiting.  There is no report of cough or fever, chest pain or shortness of breath. Most of the family has been sick with chills and body aches.  Patient has a history of dementia, diabetes, DVT, previous stroke and PE.  He does take anticoagulation.  The history is provided by the patient, the EMS personnel and a relative.       Past Medical History:  Diagnosis Date  . Dementia ([place])   . Diabetes mellitus type II 03/21/2011  . DVT (deep venous thrombosis) ([place]) 03/21/2011  . Hyperlipidemia 03/21/2011  . Hypertension 03/21/2011  . Lymphedema of leg 03/21/2011  . Pulmonary embolism ([place]) 03/21/2011  . Stroke [facility])     Patient Active Problem List   Diagnosis Date Noted  . Skin ulcer of sacrum with fat layer exposed ([place]) 07/22/2017  . Lipoma of left lower extremity 07/22/2017  . Estrogen deficiency 07/22/2017  . Hyperlipidemia associated with type 2 diabetes mellitus ([place]) 07/22/2017  . History of TIA (transient ischemic attack) 01/10/2017    . Atrial septal aneurysm 11/22/2016  . Bilateral lower extremity edema 11/22/2016  . Iron deficiency anemia due to chronic blood loss 11/16/2016  . TIA (transient ischemic attack) 10/05/2016  . Lower urinary tract infectious disease   . Current use of long term anticoagulation 08/24/2016  . Metabolic encephalopathy [id number]  . Senile dementia without behavioral disturbance ([place]) 08/23/2016  . Acute CVA (cerebrovascular accident) ([place])   . Left-sided weakness 05/11/2016  . Dysarthria 05/11/2016  . Acute left-sided weakness 05/11/2016  . History of pulmonary embolism 03/21/2011  . History of DVT (deep vein thrombosis) 03/21/2011  . Type 2 diabetes mellitus with diabetic polyneuropathy, without long-term current use of insulin ([place]) 03/21/2011  . Lymphedema of leg 03/21/2011  . Hyperlipidemia 03/21/2011  . Hypertension 03/21/2011    Past Surgical History:  Procedure Laterality Date  . CHOLECYSTECTOMY  2015  . SKIN TAG REMOVAL  07/25/2016  . VAGINAL HYSTERECTOMY     unknown of date  . VASCULAR SURGERY       OB History    Gravida  3   Para  3   Term  3   Preterm      AB      Living  3     SAB      TAB      Ectopic      Multiple      Live Births  3           Family History  Problem  Relation Age of Onset  . Heart attack Mother   . COPD Sister   . Stroke Sister   . Bipolar disorder Daughter     Social History   Tobacco Use  . Smoking status: Never Smoker  . Smokeless tobacco: Never Used  Substance Use Topics  . Alcohol use: No  . Drug use: No    Home Medications Prior to Admission medications   Medication Sig Start Date End Date Taking? Authorizing Provider  Accu-Chek Softclix Lancets lancets 1 each by Other route 2 (two) times daily. Use as instructed Dx: E11.9 02/28/19   [name], NP  acetaminophen (TYLENOL) 500 MG tablet Take 500 mg by mouth as needed (general pain).     [provider]  atorvastatin (LIPITOR) 20 MG  tablet Take 1 tablet (20 mg total) by mouth daily at 6 PM. 01/21/19   Dewaine Oats, Carlos American, NP  Cholecalciferol (VITAMIN D-3) 125 MCG (5000 UT) TABS Take 5,000 Units by mouth daily.    [provider]  cloNIDine (CATAPRES) 0.1 MG tablet Take 1 tablet (0.1 mg total) by mouth 2 (two) times daily. May take an additional tablet if needed SBP >170 04/24/19   [name], NP  collagenase (SANTYL) ointment Apply to dark skin area every 3 days 02/07/19   Ngetich, Dinah C, NP  diclofenac sodium (VOLTAREN) 1 % GEL Apply 1 application topically 2 (two) times daily as needed (pain).     [provider]  gabapentin (NEURONTIN) 300 MG capsule Take 1 capsule (300 mg total) by mouth 2 (two) times daily. 04/12/19   [name], NP  glucose blood (ACCU-CHEK AVIVA PLUS) test strip 1 each by Other route 2 (two) times daily. E11.9 02/28/19   [name], NP  liver oil-zinc oxide (DESITIN) 40 % ointment Apply 1 application topically as needed for irritation.    [provider]  losartan (COZAAR) 25 MG tablet Take 1 tablet (25 mg total) by mouth daily. Take along with 50 mg to equal 75 02/26/19 05/27/19  [name], NP  losartan (COZAAR) 50 MG tablet Take 1 tablet (50 mg total) by mouth daily. Take 1 along with 25 mg to equal 75 mg total 04/12/19 07/11/19  [name], NP  metFORMIN (GLUCOPHAGE) 500 MG tablet Take 1000 mg (2 tabs) by mouth in the am, and take 1 tab (500 mg)   by mouth in the evening. 02/26/19   [name], NP  polyethylene glycol (MIRALAX / GLYCOLAX) packet Take 17 g by mouth daily.     [provider]  Probiotic Product (PROBIOTIC PO) Take 1 capsule by mouth daily.    [provider]  Rivaroxaban (XARELTO) 15 MG TABS tablet Take 1 tablet (15 mg total) by mouth daily with supper. 11/28/18   [name], NP  UNABLE TO FIND Med Name: Portable Chest X-ray 2 views to rule Aspiration Pneumonia due to cough during meals 02/27/19    Ngetich, Dinah C, NP  Vitamins A & D (VITAMIN A & D) ointment Apply 1 application topically as needed for dry skin.    [provider]  Wound Dressings (MEPILEX EX) Apply to sacrum and change every day  as needed    [provider]    Allergies    Patient has no known allergies.  Review of Systems   Review of Systems  Unable to perform ROS: Dementia    Physical Exam Updated Vital Signs BP (!) 188/81 (BP  Location: Left Arm)   Pulse 77   Temp 98.4 F (36.9 C) (Oral)   Resp 18   SpO2 99%   Physical Exam Vitals and nursing note reviewed.  Constitutional:      General: She is not in acute distress.    Appearance: She is well-developed.     Comments: Chronically ill-appearing, moans, does not answer questions  HENT:     Head: Normocephalic and atraumatic.     Mouth/Throat:     Mouth: Mucous membranes are dry.     Pharynx: No oropharyngeal exudate.     Comments: White coating on tongue consistent with thrush Eyes:     Conjunctiva/sclera: Conjunctivae normal.     Pupils: Pupils are equal, round, and reactive to light.  Neck:     Comments: No meningismus. Cardiovascular:     Rate and Rhythm: Normal rate and regular rhythm.     Heart sounds: Normal heart sounds. No murmur.  Pulmonary:     Effort: Pulmonary effort is normal. No respiratory distress.     Breath sounds: Normal breath sounds.  Chest:     Chest wall: No tenderness.  Abdominal:     Palpations: Abdomen is soft.     Tenderness: There is no abdominal tenderness. There is no guarding or rebound.  Musculoskeletal:        General: No tenderness. Normal range of motion.     Cervical back: Normal range of motion and neck supple.  Skin:    General: Skin is warm.  Neurological:     Mental Status: She is alert.     Cranial Nerves: No cranial nerve deficit.     Motor: No abnormal muscle tone.     Coordination: Coordination normal.     Comments: Mostly nonverbal, moves all extremities equally, does  not follow commands consistently  Psychiatric:        Behavior: Behavior normal.     ED Results / Procedures / Treatments   Labs (all labs ordered are listed, but only abnormal results are displayed) Labs Reviewed  URINALYSIS, ROUTINE W REFLEX MICROSCOPIC - Abnormal; Notable for the following components:      Result Value   APPearance CLOUDY (*)    Hgb urine dipstick SMALL (*)    Protein, ur 100 (*)    Leukocytes,Ua LARGE (*)    WBC, UA >50 (*)    Bacteria, UA RARE (*)    All other components within normal limits  CBC WITH DIFFERENTIAL/PLATELET - Abnormal; Notable for the following components:   Hemoglobin 11.6 (*)    All other components within normal limits  COMPREHENSIVE METABOLIC PANEL - Abnormal; Notable for the following components:   Chloride 96 (*)    Glucose, Bld 130 (*)    Creatinine, Ser 0.40 (*)    All other components within normal limits  POC SARS CORONAVIRUS 2 AG -  ED - Abnormal; Notable for the following components:   SARS Coronavirus 2 Ag POSITIVE (*)    All other components within normal limits  URINE CULTURE  CULTURE, BLOOD (ROUTINE X 2)  CULTURE, BLOOD (ROUTINE X 2)  LACTIC ACID, PLASMA  LACTIC ACID, PLASMA  TROPONIN I (HIGH SENSITIVITY)  TROPONIN I (HIGH SENSITIVITY)    EKG EKG Interpretation  Date/Time:  Friday April 26 2019 18:44:52 EST Ventricular Rate:  73 PR Interval:    QRS Duration: 93 QT Interval:  391 QTC Calculation: 431 R Axis:   67 Text Interpretation: Sinus rhythm Supraventricular bigeminy Anteroseptal infarct, age indeterminate  No significant change was found Confirmed by Ezequiel Essex [phone]) on 04/26/2019 7:14:50 PM   Radiology CT Head Wo Contrast  Result Date: 04/26/2019 CLINICAL DATA:  Decreased p.o. intake, decreased verbal responsiveness. EXAM: CT HEAD WITHOUT CONTRAST TECHNIQUE: Contiguous axial images were obtained from the base of the skull through the vertex without intravenous contrast. COMPARISON:  Is  01/12/2019 FINDINGS: Brain: Extensive atrophy and chronic microvascular ischemic change without signs of acute intracranial process. No signs of hydrocephalus, mass, midline shift or intracranial hemorrhage. Signs of previous left cerebellar infarcts are similar to the prior study. Vascular: No hyperdense vessel or unexpected calcification. Skull: Normal. Negative for fracture or focal lesion. Sinuses/Orbits: Visualized portions of sinuses and orbits are unremarkable. Other: None. IMPRESSION: 1. No acute intracranial finding. 2. Signs of remote left cerebellar infarct and advanced age related atrophy with chronic microvascular ischemic changes. Electronically Signed   By: Zetta Bills M.D.   On: 04/26/2019 19:41   DG Chest Portable 1 View  Result Date: 04/26/2019 CLINICAL DATA:  Altered mental status EXAM: PORTABLE CHEST 1 VIEW COMPARISON:  05/15/2018 FINDINGS: Heart and mediastinal contours are within normal limits. No focal opacities or effusions. No acute bony abnormality. IMPRESSION: No active disease. Electronically Signed   By: Rolm Baptise M.D.   On: 04/26/2019 19:41    Procedures Procedures (including critical care time)  Medications Ordered in ED Medications - No data to display  ED Course  I have reviewed the triage vital signs and the nursing notes.  Pertinent labs & imaging results that were available during my care of the patient were reviewed by me and considered in my medical decision making (see chart for details).    MDM Rules/Calculators/A&P                      3 days of poor p.o. intake with anorexia, possible weight loss, nausea and fatigue.  Vitals are stable without fever or hypoxia  Coronavirus test is positive.  Labs show no significant abnormalities with normal creatinine.  Urinalysis has pyuria and culture will be sent.  Patient has no increased respiratory distress or hypoxia.  CT head is stable and chest x-ray is negative.  Labs are reassuring.  Chest x-ray  is negative.  Patient given gentle IV fluids. EKG nonischemic. Troponin negative x2.  No hypoxia or increased work of breathing.  Discussed with patient's daughter-in-law [name].  No clear indication for admission.  They are comfortable with discharge home.  She is nonambulatory at baseline.  She is able to tolerate some p.o. It appears that some  Swallowing difficulty may be due to thrush which will be treated.  Urine also be sent for culture given her pyuria.  Remains in no distress with no hypoxia or increased work of breathing. Quarantine instructions given. Treat UTI and thrush. Return to the ED with worsening symptoms including difficulty breathing, chest pain, or any other concerns.   Bunice Delahanty was evaluated in Emergency Department on 04/26/2019 for the symptoms described in the history of present illness. She was evaluated in the context of the global COVID-19 pandemic, which necessitated consideration that the patient might be at risk for infection with the SARS-CoV-2 virus that causes COVID-19. Institutional protocols and algorithms that pertain to the evaluation of patients at risk for COVID-19 are in a state of rapid change based on information released by regulatory bodies including the CDC and federal and state organizations. These policies and algorithms were followed during the patient's care  in the ED.  Final Clinical Impression(s) / ED Diagnoses Final diagnoses:  COVID-19 virus infection  Thrush  Urinary tract infection without hematuria, site unspecified    Rx / DC Orders ED Discharge Orders    None       Lalanya Rufener, Annie Main, MD 04/27/19 0104

## 2019-04-27 NOTE — ED Notes (Signed)
Paperwork printed and [facility] contacted for transport

## 2019-04-29 DIAGNOSIS — F0390 Unspecified dementia without behavioral disturbance: Secondary | ICD-10-CM | POA: Diagnosis not present

## 2019-04-29 DIAGNOSIS — I10 Essential (primary) hypertension: Secondary | ICD-10-CM | POA: Diagnosis not present

## 2019-04-29 DIAGNOSIS — I890 Lymphedema, not elsewhere classified: Secondary | ICD-10-CM | POA: Diagnosis not present

## 2019-04-29 DIAGNOSIS — E1142 Type 2 diabetes mellitus with diabetic polyneuropathy: Secondary | ICD-10-CM | POA: Diagnosis not present

## 2019-04-29 DIAGNOSIS — L89153 Pressure ulcer of sacral region, stage 3: Secondary | ICD-10-CM | POA: Diagnosis not present

## 2019-04-29 DIAGNOSIS — Z7984 Long term (current) use of oral hypoglycemic drugs: Secondary | ICD-10-CM | POA: Diagnosis not present

## 2019-04-29 LAB — URINE CULTURE: Culture: >=100000 — AB

## 2019-04-30 ENCOUNTER — Encounter: Payer: Self-pay | Admitting: Nurse Practitioner

## 2019-04-30 ENCOUNTER — Ambulatory Visit (INDEPENDENT_AMBULATORY_CARE_PROVIDER_SITE_OTHER): Payer: Medicare Other | Admitting: Nurse Practitioner

## 2019-04-30 ENCOUNTER — Other Ambulatory Visit: Payer: Self-pay

## 2019-04-30 DIAGNOSIS — B370 Candidal stomatitis: Secondary | ICD-10-CM

## 2019-04-30 DIAGNOSIS — N3000 Acute cystitis without hematuria: Secondary | ICD-10-CM

## 2019-04-30 DIAGNOSIS — U071 COVID-19: Secondary | ICD-10-CM

## 2019-04-30 NOTE — Progress Notes (Signed)
ED Antimicrobial Stewardship Positive Culture Follow Up   [name] is an 82 y.o. female who presented to [facility] on 04/26/2019 with a chief complaint of  Chief Complaint  Patient presents with  . Failure To Thrive    Recent Results (from the past 720 hour(s))  Blood culture (routine x 2)     Status: None (Preliminary result)   Collection Time: 04/26/19  6:31 PM   Specimen: BLOOD  Result Value Ref Range Status   Specimen Description   Final    BLOOD BLOOD LEFT WRIST Performed at [facility], [place] [address] [place], [place] [address]    Special Requests   Final    BOTTLES DRAWN AEROBIC AND ANAEROBIC Blood Culture adequate volume Performed at [facility], [place] [address] [place], [place] [address]    Culture   Final    NO GROWTH 4 DAYS Performed at [facility], [place] [address] [place], [place] [address]    Report Status PENDING  Incomplete  Blood culture (routine x 2)     Status: None (Preliminary result)   Collection Time: 04/26/19  6:36 PM   Specimen: BLOOD  Result Value Ref Range Status   Specimen Description   Final    BLOOD LEFT ANTECUBITAL Performed at [facility], [place] [address] [place], [place] [address]    Special Requests   Final    BOTTLES DRAWN AEROBIC ONLY Blood Culture results may not be optimal due to an inadequate volume of blood received in culture bottles Performed at [facility], [address] [place], [place] [address]    Culture   Final    NO GROWTH 4 DAYS Performed at [facility], [place] [address] [place], [place] [address]    Report Status PENDING  Incomplete  Urine culture     Status: Abnormal   Collection Time: 04/26/19  6:43 PM   Specimen: Urine, Random  Result Value Ref Range Status   Specimen Description   Final    URINE, RANDOM Performed at [facility], [place] [address] [place], [place]  [address]    Special Requests   Final    NONE Performed at [facility], [place] [address] [place], [place] [address]    Culture >=100,000 COLONIES/mL ENTEROCOCCUS FAECALIS (A)  Final   Report Status 04/29/2019 FINAL  Final   Organism ID, Bacteria ENTEROCOCCUS FAECALIS (A)  Final      Susceptibility   Enterococcus faecalis - MIC*    AMPICILLIN <=2 SENSITIVE Sensitive     NITROFURANTOIN <=16 SENSITIVE Sensitive     VANCOMYCIN 2 SENSITIVE Sensitive     * >=100,000 COLONIES/mL ENTEROCOCCUS FAECALIS    [x]  Treated with Keflex, organism resistant to prescribed antimicrobial []  Patient discharged originally without antimicrobial agent and treatment is now indicated  New antibiotic prescription: Amoxicillin 500mg  tid, #21  ED Provider: Alyse Low, PA   Minda Ditto PharmD 04/30/2019, 11:41 AM Clinical Pharmacist [phone]

## 2019-04-30 NOTE — Telephone Encounter (Signed)
Post ED Visit - Positive Culture Follow-up: Successful Patient Follow-Up  Culture assessed and recommendations reviewed by:  []  Elenor Quinones, Pharm.D. []  Heide Guile, Pharm.D., BCPS AQ-ID []  Parks Neptune, Pharm.D., BCPS []  Alycia Rossetti, Pharm.D., BCPS []  [place], Pharm.D., BCPS, AAHIVP []  Legrand Como, Pharm.D., BCPS, AAHIVP []  Salome Arnt, PharmD, BCPS []  Johnnette Gourd, PharmD, BCPS []  Hughes Better, PharmD, BCPS []  Leeroy Cha, PharmD  Positive urine culture  []  Patient discharged without antimicrobial prescription and treatment is now indicated [x]  Organism is resistant to prescribed ED discharge antimicrobial []  Patient with positive blood cultures  Changes discussed with ED provider: Alyse Low PA New antibiotic prescription stop cephalexin, start Amoxicillin 500mg  po tid x 7 days Called to [place]   Hazle Nordmann 04/30/2019, 12:17 PM

## 2019-04-30 NOTE — Progress Notes (Signed)
This service is provided via telemedicine  No vital signs collected/recorded due to the encounter was a telemedicine visit.   Location of patient (ex: home, work): Home  Patient consents to a telephone visit: Yes  Location of the provider (ex: office, home):  [facility]   Name of any referring provider:  N/A  Names of all persons participating in the telemedicine service and their role in the encounter: S.Chrae B/CMA, [name], NP, [name] (relative) and Patient   Time spent on call:  8 min      Careteam: Patient Care Team: [name], NP as PCP - General (Geriatric Medicine) [name], MD as Consulting Physician (Oncology) [name], [name], LCSW as Social Worker (Licensed Clinical Social Worker) [name], RN as Registered Nurse (Hospice and [place])  Advanced Directive information    No Known Allergies  Chief Complaint  Patient presents with  . Acute Visit    Increased cough.Telephone visit   . Medication Management    Metformin dose varies based on blood sugar reading, not taking as prescribed     HPI: Patient is a 83 y.o. female via telephone visit due to COVID positive pt and now has a cough.  [name] pts daughter-in-law reports the entire house hold has tested positive for COVID-19.  Last week she had decrease appetite and intake. VSS and O2 sats were good. They took her to the ED 5 days ago and they checked her blood work and urine. She was positive for thrush as well.  Today they called and changed her antibiotic to amoxicillin due to sensitivities coming back. After ED visit she perked up and eating better and more alert Overall much better but today appetite is better and she is coughing more. In the last hour the coughing has actually improving When she came from the hospital was not coughing at all.  Nursing came out today and said her lungs were fine and [name] reports that in the ED they said her chest xray looked  good.  Unable to swallow pills whole. No fevers or chills  Review of Systems:  Review of Systems  Unable to perform ROS: Dementia  Constitutional: Negative for chills and fever.    Past Medical History:  Diagnosis Date  . Dementia ([place])   . Diabetes mellitus type II 03/21/2011  . DVT (deep venous thrombosis) ([place]) 03/21/2011  . Hyperlipidemia 03/21/2011  . Hypertension 03/21/2011  . Lymphedema of leg 03/21/2011  . Pulmonary embolism ([place]) 03/21/2011  . Stroke [facility])    Past Surgical History:  Procedure Laterality Date  . CHOLECYSTECTOMY  2015  . SKIN TAG REMOVAL  07/25/2016  . VAGINAL HYSTERECTOMY     unknown of date  . VASCULAR SURGERY     Social History:   reports that she has never smoked. She has never used smokeless tobacco. She reports that she does not drink alcohol or use drugs.  Family History  Problem Relation Age of Onset  . Heart attack Mother   . COPD Sister   . Stroke Sister   . Bipolar disorder Daughter     Medications: Patient's Medications  New Prescriptions   No medications on file  Previous Medications   ACCU-CHEK SOFTCLIX LANCETS LANCETS    1 each by Other route 2 (two) times daily. Use as instructed Dx: E11.9   ACETAMINOPHEN (TYLENOL) 500 MG TABLET    Take 500 mg by mouth as needed (general pain).    ATORVASTATIN (LIPITOR) 20 MG  TABLET    Take 1 tablet (20 mg total) by mouth daily at 6 PM.   CHOLECALCIFEROL (VITAMIN D-3) 125 MCG (5000 UT) TABS    Take 5,000 Units by mouth daily.   CLONIDINE (CATAPRES) 0.1 MG TABLET    Take 1 tablet (0.1 mg total) by mouth 2 (two) times daily. May take an additional tablet if needed SBP >170   DICLOFENAC SODIUM (VOLTAREN) 1 % GEL    Apply 1 application topically 2 (two) times daily as needed (pain).    GABAPENTIN (NEURONTIN) 300 MG CAPSULE    Take 1 capsule (300 mg total) by mouth 2 (two) times daily.   GLUCOSE BLOOD (ACCU-CHEK AVIVA PLUS) TEST STRIP    1 each by Other route 2 (two) times daily. E11.9   LOSARTAN  (COZAAR) 25 MG TABLET    Take 1 tablet (25 mg total) by mouth daily. Take along with 50 mg to equal 75   LOSARTAN (COZAAR) 50 MG TABLET    Take 1 tablet (50 mg total) by mouth daily. Take 1 along with 25 mg to equal 75 mg total   MENTHOL-ZINC OXIDE (MOISTURE BARRIER EX)    Apply topically as needed.   METFORMIN (GLUCOPHAGE) 500 MG TABLET    Take 1000 mg (2 tabs) by mouth in the am, and take 1 tab (500 mg)   by mouth in the evening.   NYSTATIN (MYCOSTATIN) 100000 UNIT/ML SUSPENSION    Take 5 mLs (500,000 Units total) by mouth 4 (four) times daily.   POLYETHYLENE GLYCOL (MIRALAX / GLYCOLAX) PACKET    Take 17 g by mouth daily.    PROBIOTIC PRODUCT (PROBIOTIC PO)    Take 1 capsule by mouth daily.   RIVAROXABAN (XARELTO) 15 MG TABS TABLET    Take 1 tablet (15 mg total) by mouth daily with supper.   UNABLE TO FIND    Med Name: Portable Chest X-ray 2 views to rule Aspiration Pneumonia due to cough during meals   VITAMINS A & D (VITAMIN A & D) OINTMENT    Apply 1 application topically as needed for dry skin.   WOUND DRESSINGS (MEPILEX EX)    Apply to sacrum and change every day  as needed  Modified Medications   No medications on file  Discontinued Medications   CEPHALEXIN (KEFLEX) 500 MG CAPSULE    Take 1 capsule (500 mg total) by mouth 3 (three) times daily.   COLLAGENASE (SANTYL) OINTMENT    Apply to dark skin area every 3 days   LIVER OIL-ZINC OXIDE (DESITIN) 40 % OINTMENT    Apply 1 application topically as needed for irritation.    Physical Exam:  There were no vitals filed for this visit. There is no height or weight on file to calculate BMI. Wt Readings from Last 3 Encounters:  11/02/18 130 lb (59 kg)  01/30/18 130 lb (59 kg)  08/07/17 130 lb (59 kg)      Labs reviewed: Basic Metabolic Panel: Recent Labs    01/27/19 1636 02/25/19 1518 04/26/19 1843  NA 140 140 135  K 4.0 4.6 4.1  CL 100 103 96*  CO2 25 26 27   GLUCOSE 179* 280* 130*  BUN 20 27* 20  CREATININE 0.43* 0.43*  0.40*  CALCIUM 9.3 9.3 9.4   Liver Function Tests: Recent Labs    01/11/19 2004 01/27/19 1636 04/26/19 1843  AST 16 17 18   ALT 14 12 14   ALKPHOS 76 65 66  BILITOT 0.6 0.1* 0.5  PROT  7.5 6.0* 6.6  ALBUMIN 3.4* 3.0* 3.6   No results for input(s): LIPASE, AMYLASE in the last 8760 hours. No results for input(s): AMMONIA in the last 8760 hours. CBC: Recent Labs    01/27/19 1636 02/25/19 1518 04/26/19 1843  WBC 4.0 4.1 4.0  NEUTROABS 2.8 2,341 2.1  HGB 10.4* 10.8* 11.6*  HCT 34.1* 33.6* 38.1  MCV 95.3 91.3 94.3  PLT 245 207 174   Lipid Panel: Recent Labs    09/03/18 1143  CHOL 173  HDL 52  LDLCALC 103*  TRIG 90  CHOLHDL 3.3   TSH: No results for input(s): TSH in the last 8760 hours. A1C: Lab Results  Component Value Date   HGBA1C 7.9 (H) 02/25/2019     Assessment/Plan 1. COVID-19 Active infection at this time. Doing well without increase shortness of breath, or chest congestion. Mild cough which daughter-in-law feels okay with just monitoring at this time without giving medication due to mild symptoms. She has been more alert and eating and drinking well. Continues to monitor O2 saturations and temperature. Advised on when to go back to the ED, (worsening mental status, increase work of breathing, decrease in O2 sats, etc) she is in agreement.  -to encourage hydration, deep breathing and sitting up at much as possible.   2. Acute cystitis without hematuria -based on culture she has not been changed to amoxicillin. Daughter called and had it changed to liquid, plan to pick up today. Encouraged to push fluids with this as well.   3. Thrush  Has been taking medication for thrush and has seen improvement. To continue through amoxicillin, to notify if she needs refill based on extended use of antibiotics.   Next appt: 1 week for follow up.  Carlos American. Keysha Damewood, [facility] [phone]    Virtual Visit via Telephone Note  I  connected with pt and daughter on 04/30/19 at  3:45 PM EST by telephone and verified that I am speaking with the correct person using two identifiers.  Location: Patient: home Provider: [facility]   I discussed the limitations, risks, security and privacy concerns of performing an evaluation and management service by telephone and the availability of in person appointments. I also discussed with the patient that there may be a patient responsible charge related to this service. The patient expressed understanding and agreed to proceed.   I discussed the assessment and treatment plan with the patient. The patient was provided an opportunity to ask questions and all were answered. The patient agreed with the plan and demonstrated an understanding of the instructions.   The patient was advised to call back or seek an in-person evaluation if the symptoms worsen or if the condition fails to improve as anticipated.  I provided 21 minutes of non-face-to-face time during this encounter.  Carlos American. Harle Battiest Avs printed and mailed

## 2019-05-01 LAB — CULTURE, BLOOD (ROUTINE X 2)
Culture: NO GROWTH
Culture: NO GROWTH
Special Requests: ADEQUATE

## 2019-05-04 ENCOUNTER — Observation Stay (HOSPITAL_BASED_OUTPATIENT_CLINIC_OR_DEPARTMENT_OTHER): Payer: Medicare Other

## 2019-05-04 ENCOUNTER — Observation Stay (HOSPITAL_COMMUNITY): Payer: Medicare Other

## 2019-05-04 ENCOUNTER — Other Ambulatory Visit: Payer: Self-pay

## 2019-05-04 ENCOUNTER — Observation Stay (HOSPITAL_COMMUNITY)
Admission: EM | Admit: 2019-05-04 | Discharge: 2019-05-05 | Disposition: A | Discharge status: Home / Self Care | Payer: Medicare Other | Attending: Internal Medicine | Admitting: Internal Medicine

## 2019-05-04 ENCOUNTER — Encounter (HOSPITAL_COMMUNITY): Payer: Self-pay

## 2019-05-04 ENCOUNTER — Emergency Department (HOSPITAL_COMMUNITY): Payer: Medicare Other

## 2019-05-04 DIAGNOSIS — Z86711 Personal history of pulmonary embolism: Secondary | ICD-10-CM | POA: Insufficient documentation

## 2019-05-04 DIAGNOSIS — B952 Enterococcus as the cause of diseases classified elsewhere: Secondary | ICD-10-CM | POA: Diagnosis not present

## 2019-05-04 DIAGNOSIS — Z8673 Personal history of transient ischemic attack (TIA), and cerebral infarction without residual deficits: Secondary | ICD-10-CM | POA: Insufficient documentation

## 2019-05-04 DIAGNOSIS — Z79899 Other long term (current) drug therapy: Secondary | ICD-10-CM | POA: Diagnosis not present

## 2019-05-04 DIAGNOSIS — R4182 Altered mental status, unspecified: Secondary | ICD-10-CM | POA: Insufficient documentation

## 2019-05-04 DIAGNOSIS — Z7901 Long term (current) use of anticoagulants: Secondary | ICD-10-CM | POA: Insufficient documentation

## 2019-05-04 DIAGNOSIS — F0390 Unspecified dementia without behavioral disturbance: Secondary | ICD-10-CM | POA: Insufficient documentation

## 2019-05-04 DIAGNOSIS — L89152 Pressure ulcer of sacral region, stage 2: Secondary | ICD-10-CM | POA: Insufficient documentation

## 2019-05-04 DIAGNOSIS — Z86718 Personal history of other venous thrombosis and embolism: Secondary | ICD-10-CM | POA: Insufficient documentation

## 2019-05-04 DIAGNOSIS — N390 Urinary tract infection, site not specified: Secondary | ICD-10-CM | POA: Diagnosis not present

## 2019-05-04 DIAGNOSIS — U071 COVID-19: Secondary | ICD-10-CM | POA: Insufficient documentation

## 2019-05-04 DIAGNOSIS — R531 Weakness: Secondary | ICD-10-CM | POA: Diagnosis not present

## 2019-05-04 DIAGNOSIS — Z823 Family history of stroke: Secondary | ICD-10-CM | POA: Diagnosis not present

## 2019-05-04 DIAGNOSIS — E1142 Type 2 diabetes mellitus with diabetic polyneuropathy: Secondary | ICD-10-CM | POA: Insufficient documentation

## 2019-05-04 DIAGNOSIS — R262 Difficulty in walking, not elsewhere classified: Secondary | ICD-10-CM | POA: Insufficient documentation

## 2019-05-04 DIAGNOSIS — G9341 Metabolic encephalopathy: Secondary | ICD-10-CM | POA: Insufficient documentation

## 2019-05-04 DIAGNOSIS — R55 Syncope and collapse: Principal | ICD-10-CM | POA: Diagnosis present

## 2019-05-04 DIAGNOSIS — I080 Rheumatic disorders of both mitral and aortic valves: Secondary | ICD-10-CM | POA: Diagnosis not present

## 2019-05-04 DIAGNOSIS — E785 Hyperlipidemia, unspecified: Secondary | ICD-10-CM | POA: Diagnosis not present

## 2019-05-04 DIAGNOSIS — Z8249 Family history of ischemic heart disease and other diseases of the circulatory system: Secondary | ICD-10-CM | POA: Diagnosis not present

## 2019-05-04 DIAGNOSIS — I340 Nonrheumatic mitral (valve) insufficiency: Secondary | ICD-10-CM

## 2019-05-04 DIAGNOSIS — R5381 Other malaise: Secondary | ICD-10-CM | POA: Insufficient documentation

## 2019-05-04 DIAGNOSIS — I119 Hypertensive heart disease without heart failure: Secondary | ICD-10-CM | POA: Diagnosis not present

## 2019-05-04 DIAGNOSIS — L8990 Pressure ulcer of unspecified site, unspecified stage: Secondary | ICD-10-CM | POA: Diagnosis present

## 2019-05-04 DIAGNOSIS — I10 Essential (primary) hypertension: Secondary | ICD-10-CM | POA: Diagnosis present

## 2019-05-04 LAB — CBC WITH DIFFERENTIAL/PLATELET
Abs Immature Granulocytes: 0.02 10*3/uL (ref 0.00–0.07)
Basophils Absolute: 0 10*3/uL (ref 0.0–0.1)
Basophils Relative: 0 %
Eosinophils Absolute: 0 10*3/uL (ref 0.0–0.5)
Eosinophils Relative: 0 %
HCT: 36.4 % (ref 36.0–46.0)
Hemoglobin: 11.5 g/dL — ABNORMAL LOW (ref 12.0–15.0)
Immature Granulocytes: 1 %
Lymphocytes Relative: 38 %
Lymphs Abs: 0.9 10*3/uL (ref 0.7–4.0)
MCH: 29.2 pg (ref 26.0–34.0)
MCHC: 31.6 g/dL (ref 30.0–36.0)
MCV: 92.4 fL (ref 80.0–100.0)
Monocytes Absolute: 0.4 10*3/uL (ref 0.1–1.0)
Monocytes Relative: 14 %
Neutro Abs: 1.2 10*3/uL — ABNORMAL LOW (ref 1.7–7.7)
Neutrophils Relative %: 47 %
Platelets: 173 10*3/uL (ref 150–400)
RBC: 3.94 MIL/uL (ref 3.87–5.11)
RDW: 12.8 % (ref 11.5–15.5)
WBC: 2.5 10*3/uL — ABNORMAL LOW (ref 4.0–10.5)
nRBC: 0 % (ref 0.0–0.2)

## 2019-05-04 LAB — CBG MONITORING, ED
Glucose-Capillary: 115 mg/dL — ABNORMAL HIGH (ref 70–99)
Glucose-Capillary: 126 mg/dL — ABNORMAL HIGH (ref 70–99)
Glucose-Capillary: 150 mg/dL — ABNORMAL HIGH (ref 70–99)
Glucose-Capillary: 186 mg/dL — ABNORMAL HIGH (ref 70–99)
Glucose-Capillary: 276 mg/dL — ABNORMAL HIGH (ref 70–99)
Glucose-Capillary: 79 mg/dL (ref 70–99)

## 2019-05-04 LAB — BASIC METABOLIC PANEL
Anion gap: 12 (ref 5–15)
BUN: 17 mg/dL (ref 8–23)
CO2: 25 mmol/L (ref 22–32)
Calcium: 9.2 mg/dL (ref 8.9–10.3)
Chloride: 97 mmol/L — ABNORMAL LOW (ref 98–111)
Creatinine, Ser: 0.57 mg/dL (ref 0.44–1.00)
GFR calc Af Amer: >60 mL/min (ref >60)
GFR calc non Af Amer: >60 mL/min (ref >60)
Glucose, Bld: 311 mg/dL — ABNORMAL HIGH (ref 70–99)
Potassium: 4.5 mmol/L (ref 3.5–5.1)
Sodium: 134 mmol/L — ABNORMAL LOW (ref 135–145)

## 2019-05-04 LAB — ECHOCARDIOGRAM COMPLETE
Height: 67 in
Weight: 2081.14 oz

## 2019-05-04 LAB — TROPONIN I (HIGH SENSITIVITY)
Troponin I (High Sensitivity): 7 ng/L (ref <18)
Troponin I (High Sensitivity): 9 ng/L (ref <18)

## 2019-05-04 LAB — TSH: TSH: 2.756 u[IU]/mL (ref 0.350–4.500)

## 2019-05-04 LAB — SARS CORONAVIRUS 2 (TAT 6-24 HRS): SARS Coronavirus 2: POSITIVE — AB

## 2019-05-04 MED ORDER — RIVAROXABAN 15 MG PO TABS
15.0000 mg | ORAL_TABLET | Freq: Every day | ORAL | Status: DC
  Administered 2019-05-05: 01:00:00 15 mg via ORAL
  Filled 2019-05-04 (×2): qty 1

## 2019-05-04 MED ORDER — INSULIN ASPART 100 UNIT/ML ~~LOC~~ SOLN
0.0000 [IU] | SUBCUTANEOUS | Status: DC
  Administered 2019-05-04: 2 [IU] via SUBCUTANEOUS
  Administered 2019-05-04 – 2019-05-05 (×2): 1 [IU] via SUBCUTANEOUS

## 2019-05-04 MED ORDER — POLYETHYLENE GLYCOL 3350 17 G PO PACK: 17.0000 g | PACK | Freq: Every day | ORAL | Status: DC | PRN

## 2019-05-04 MED ORDER — NYSTATIN 100000 UNIT/ML MT SUSP
500000.0000 [IU] | Freq: Four times a day (QID) | OROMUCOSAL | Status: DC
  Administered 2019-05-05: 500000 [IU] via ORAL
  Filled 2019-05-04 (×6): qty 5

## 2019-05-04 MED ORDER — HYDRALAZINE HCL 20 MG/ML IJ SOLN
10.0000 mg | Freq: Four times a day (QID) | INTRAMUSCULAR | Status: DC | PRN
  Administered 2019-05-04 – 2019-05-05 (×3): 10 mg via INTRAVENOUS
  Filled 2019-05-04 (×3): qty 1

## 2019-05-04 MED ORDER — SODIUM CHLORIDE 0.9 % IV SOLN
1.0000 g | Freq: Four times a day (QID) | INTRAVENOUS | Status: DC
  Administered 2019-05-04 – 2019-05-05 (×5): 1 g via INTRAVENOUS
  Filled 2019-05-04 (×8): qty 1000

## 2019-05-04 MED ORDER — LOSARTAN POTASSIUM 50 MG PO TABS
75.0000 mg | ORAL_TABLET | Freq: Every day | ORAL | Status: DC
  Administered 2019-05-05: 11:00:00 75 mg via ORAL
  Filled 2019-05-04: qty 2

## 2019-05-04 MED ORDER — CLONIDINE HCL 0.1 MG PO TABS
0.1000 mg | ORAL_TABLET | Freq: Two times a day (BID) | ORAL | Status: DC
  Administered 2019-05-05 (×2): 0.1 mg via ORAL
  Filled 2019-05-04 (×2): qty 1

## 2019-05-04 MED ORDER — ACETAMINOPHEN 500 MG PO TABS: 500.0000 mg | ORAL_TABLET | Freq: Three times a day (TID) | ORAL | Status: DC | PRN

## 2019-05-04 MED ORDER — SODIUM CHLORIDE 0.9 % IV BOLUS
250.0000 mL | Freq: Once | INTRAVENOUS | Status: AC
  Administered 2019-05-04: 03:00:00 250 mL via INTRAVENOUS

## 2019-05-04 MED ORDER — SODIUM CHLORIDE 0.9% FLUSH
3.0000 mL | Freq: Two times a day (BID) | INTRAVENOUS | Status: DC
  Administered 2019-05-04 – 2019-05-05 (×2): 3 mL via INTRAVENOUS

## 2019-05-04 NOTE — Progress Notes (Signed)
EEG complete - results pending 

## 2019-05-04 NOTE — ED Notes (Signed)
Pt is more alert, denies pain at this time.

## 2019-05-04 NOTE — ED Notes (Signed)
Unable to get blood stuck pt.x3 report to nurse tyler rn

## 2019-05-04 NOTE — ED Notes (Signed)
EEG at bedside.

## 2019-05-04 NOTE — Consult Note (Signed)
[place] Nurse Consult Note: Reason for Consult: Stage 2 pressure injury noted to be present on admission by ED RN, Margaretmary Dys. Wound type:Pressure Pressure Injury POA: Yes Measurement:To be obtained by Bedside RN to day and documented on the Flow Sheet Wound bed: Pink moist Drainage (amount, consistency, odor) small amount of serous Periwound: intact Dressing procedure/placement/frequency: I have provided Nursing with guidance for the care of this wound using xeroform as a wound contact layer and topping with dry gauze prior to covering with a silicone foam dressing. The silicone foam can be used for up to 3 days and changed PRN for spoiling or rolling of dressing edges, but the xeroform is to be changed daily. Patient to be turned side to side and heels floated to prevent pressure injury.  [place] nursing team will not follow, but will remain available to this patient, the nursing and medical teams.  Please re-consult if needed. Thanks, Maudie Flakes, MSN, RN, [place], Arther Abbott  Pager# [phone]

## 2019-05-04 NOTE — ED Triage Notes (Signed)
Pt arrives via [facility] c/o altered LOC. GCS 6, responsive to painful stimuli. Pt tested COVID + one week ago. VS: BP 130/77, HR 74, RR 13 (shallow), SPO2 98% RA.

## 2019-05-04 NOTE — ED Notes (Signed)
Unable to obtain troponin (RN and phlebotomy multiple attempts)

## 2019-05-04 NOTE — H&P (Signed)
History and Physical    [name] [id number] DOB: 02/27/1936 DOA: 05/04/2019  PCP: [name], NP  Patient coming from: Home  I have personally briefly reviewed patient's old medical records in [place]  Chief Complaint: Syncope / AMS  HPI: [name] is a 83 y.o. female with medical history significant of dementia, DM2, DVT/PE on xarelto, stroke, HTN.  For the past year or so patient has had episodes of syncope followed by lethargy and slow return to baseline per daughter-in-law.  No tremor, did have fecal incontinence with one episode.  Episodes always accompanied by a drop in blood pressure and a spike up in her blood glucose.  Tonight she had another syncope episode.  Patient remained altered and family was concerned given that patient (and family) were recently diagnosed with COVID-19 x 11 days ago, and so EMS called.  Patient has otherwise been asymptomatic from the COVID perspective, no SOB, cough, fevers, chills.  She has also been on ABx for a UTI, initially started on keflex after that ED visit before being switched a couple of days ago to amoxicillin after cultures came back for enterococcus.   ED Course: Afebrile, WBC 2.5, no other SIRS.  Satting 100% on RA with a clear CXR.  UA pending.  EKG shows new Q waves in inferior leads that wernt present on prior EKGs.  No ST changes to suggest acute ischemia nor STEMI though.  EDP requests a syncope admission.   Review of Systems: Unable to perform due to AMS / dementia. Past Medical History:  Diagnosis Date  . Dementia ([place])   . Diabetes mellitus type II 03/21/2011  . DVT (deep venous thrombosis) ([place]) 03/21/2011  . Hyperlipidemia 03/21/2011  . Hypertension 03/21/2011  . Lymphedema of leg 03/21/2011  . Pulmonary embolism ([place]) 03/21/2011  . Stroke [facility])     Past Surgical History:  Procedure Laterality Date  . CHOLECYSTECTOMY  2015  . SKIN TAG REMOVAL  07/25/2016  . VAGINAL HYSTERECTOMY      unknown of date  . VASCULAR SURGERY       reports that she has never smoked. She has never used smokeless tobacco. She reports that she does not drink alcohol or use drugs.  No Known Allergies  Family History  Problem Relation Age of Onset  . Heart attack Mother   . COPD Sister   . Stroke Sister   . Bipolar disorder Daughter      Prior to Admission medications   Medication Sig Start Date End Date Taking? Authorizing Provider  Accu-Chek Softclix Lancets lancets 1 each by Other route 2 (two) times daily. Use as instructed Dx: E11.9 02/28/19   [name], NP  acetaminophen (TYLENOL) 500 MG tablet Take 500 mg by mouth as needed (general pain).     [provider]  atorvastatin (LIPITOR) 20 MG tablet Take 1 tablet (20 mg total) by mouth daily at 6 PM. 01/21/19   Dewaine Oats, Carlos American, NP  Cholecalciferol (VITAMIN D-3) 125 MCG (5000 UT) TABS Take 5,000 Units by mouth daily.    [provider]  cloNIDine (CATAPRES) 0.1 MG tablet Take 1 tablet (0.1 mg total) by mouth 2 (two) times daily. May take an additional tablet if needed SBP >170 04/24/19   [name], NP  diclofenac sodium (VOLTAREN) 1 % GEL Apply 1 application topically 2 (two) times daily as needed (pain).     [provider]  gabapentin (NEURONTIN) 300 MG capsule Take 1 capsule (300  mg total) by mouth 2 (two) times daily. 04/12/19   [name], NP  glucose blood (ACCU-CHEK AVIVA PLUS) test strip 1 each by Other route 2 (two) times daily. E11.9 02/28/19   [name], NP  losartan (COZAAR) 25 MG tablet Take 1 tablet (25 mg total) by mouth daily. Take along with 50 mg to equal 75 02/26/19 05/27/19  [name], NP  losartan (COZAAR) 50 MG tablet Take 1 tablet (50 mg total) by mouth daily. Take 1 along with 25 mg to equal 75 mg total 04/12/19 07/11/19  [name], NP  Menthol-Zinc Oxide (MOISTURE BARRIER EX) Apply topically as needed.    [provider]    metFORMIN (GLUCOPHAGE) 500 MG tablet Take 1000 mg (2 tabs) by mouth in the am, and take 1 tab (500 mg)   by mouth in the evening. 02/26/19   [name], NP  nystatin (MYCOSTATIN) 100000 UNIT/ML suspension Take 5 mLs (500,000 Units total) by mouth 4 (four) times daily. 04/26/19   Rancour, Annie Main, MD  polyethylene glycol (MIRALAX / GLYCOLAX) packet Take 17 g by mouth daily.     [provider]  Probiotic Product (PROBIOTIC PO) Take 1 capsule by mouth daily.    [provider]  Rivaroxaban (XARELTO) 15 MG TABS tablet Take 1 tablet (15 mg total) by mouth daily with supper. 11/28/18   [name], NP  UNABLE TO FIND Med Name: Portable Chest X-ray 2 views to rule Aspiration Pneumonia due to cough during meals 02/27/19   Ngetich, Dinah C, NP  Vitamins A & D (VITAMIN A & D) ointment Apply 1 application topically as needed for dry skin.    [provider]  Wound Dressings ([place]) Apply to sacrum and change every day  as needed    [provider]    Physical Exam: Vitals:   05/04/19 0215 05/04/19 0230 05/04/19 0323 05/04/19 0432  BP: 118/62 134/72 138/78 130/72  Pulse: 69 64 64 68  Resp: 12 15 15 16   Temp:      TempSrc:      SpO2: 100% 100% 100% 100%  Weight:      Height:        Constitutional: NAD, calm, comfortable Eyes: PERRL, lids and conjunctivae normal ENMT: Mucous membranes are moist. Posterior pharynx clear of any exudate or lesions.Normal dentition.  Neck: normal, supple, no masses, no thyromegaly Respiratory: clear to auscultation bilaterally, no wheezing, no crackles. Normal respiratory effort. No accessory muscle use.  Cardiovascular: Regular rate and rhythm, no murmurs / rubs / gallops. No extremity edema. 2+ pedal pulses. No carotid bruits.  Abdomen: no tenderness, no masses palpated. No hepatosplenomegaly. Bowel sounds positive.  Musculoskeletal: no clubbing / cyanosis. No joint deformity upper and lower extremities. Good  ROM, no contractures. Normal muscle tone.  Skin: no rashes, lesions, ulcers. No induration Neurologic: MAE, opens eyes to noxious stimuli, says a few non-understandable words to RN.  Grossly non-focal Psychiatric: Severely demented   Labs on Admission: I have personally reviewed following labs and imaging studies  CBC: Recent Labs  Lab 05/04/19 0035  WBC 2.5*  NEUTROABS 1.2*  HGB 11.5*  HCT 36.4  MCV 92.4  PLT [id number]   Basic Metabolic Panel: Recent Labs  Lab 05/04/19 0035  NA 134*  K 4.5  CL 97*  CO2 25  GLUCOSE 311*  BUN 17  CREATININE 0.57  CALCIUM 9.2   GFR: Estimated Creatinine Clearance: 51.4 mL/min (by C-G formula based  on SCr of 0.57 mg/dL). Liver Function Tests: No results for input(s): AST, ALT, ALKPHOS, BILITOT, PROT, ALBUMIN in the last 168 hours. No results for input(s): LIPASE, AMYLASE in the last 168 hours. No results for input(s): AMMONIA in the last 168 hours. Coagulation Profile: No results for input(s): INR, PROTIME in the last 168 hours. Cardiac Enzymes: No results for input(s): CKTOTAL, CKMB, CKMBINDEX, TROPONINI in the last 168 hours. BNP (last 3 results) No results for input(s): PROBNP in the last 8760 hours. HbA1C: No results for input(s): HGBA1C in the last 72 hours. CBG: Recent Labs  Lab 05/04/19 0031  GLUCAP 276*   Lipid Profile: No results for input(s): CHOL, HDL, LDLCALC, TRIG, CHOLHDL, LDLDIRECT in the last 72 hours. Thyroid Function Tests: No results for input(s): TSH, T4TOTAL, FREET4, T3FREE, THYROIDAB in the last 72 hours. Anemia Panel: No results for input(s): VITAMINB12, FOLATE, FERRITIN, TIBC, IRON, RETICCTPCT in the last 72 hours. Urine analysis:    Component Value Date/Time   COLORURINE YELLOW 04/26/2019 1843   APPEARANCEUR CLOUDY (A) 04/26/2019 1843   LABSPEC 1.020 04/26/2019 1843   PHURINE 5.0 04/26/2019 1843   GLUCOSEU NEGATIVE 04/26/2019 1843   HGBUR SMALL (A) 04/26/2019 1843   BILIRUBINUR NEGATIVE 04/26/2019  [place] 04/26/2019 1843   PROTEINUR 100 (A) 04/26/2019 1843   NITRITE NEGATIVE 04/26/2019 1843   LEUKOCYTESUR LARGE (A) 04/26/2019 1843    Radiological Exams on Admission: DG Chest Port 1 View  Result Date: 05/04/2019 CLINICAL DATA:  Syncope EXAM: PORTABLE CHEST 1 VIEW COMPARISON:  Radiograph 04/26/2019 FINDINGS: Chronic interstitial changes are similar to prior. No consolidation, features of edema, pneumothorax, or effusion. The aorta is calcified. The remaining cardiomediastinal contours are unremarkable. Dextrocurvature of the thoracic spine is unchanged from priors. Degenerative changes are present in the imaged spine and shoulders. No acute osseous or soft tissue abnormality. Telemetry leads overlie the chest. IMPRESSION: No acute cardiopulmonary abnormality. Electronically Signed   By: Lovena Le M.D.   On: 05/04/2019 01:33    EKG: Independently reviewed.  Assessment/Plan Principal Problem:   Syncope Active Problems:   Type 2 diabetes mellitus with diabetic polyneuropathy, without long-term current use of insulin (HCC)   Hypertension   Enterococcus UTI   Pressure injury of skin   COVID-19 virus RNA detected    1. Syncope - 1. DDx includes cardiogenic, seizure, or delirium due to illness (COVID or enterococcal UTI), stroke or ICH is possible but less likely given the episodic description by family 2. Syncope pathway 3. Tele monitor 4. Check stat trop given the EKG changes 5. CT head stat - though CT head was neg for acute findings on 12/18, and thankfully this sounds much more episodic from what family is describing 6. Will need to start IVF for maint if she doesn't wake up later this morning 2. Enterococcus UTI - partially treated 1. Will put on ampicillin until she wakes up and is able to resume PO amoxicillin 3. COVID-19 - 1. Tested positive 11 days ago 2. But other than syncope today, is essentially asymptomatic 3. CXR neg, no SIRS other than WBC  2.5, and  4. Entire family has tested positive and is asymptomatic it seems. 5. Will put her in as a 'PUI' given the positive COVID results but no COVID like illness symptoms. 4. H/o DVT/PE - 1. Confirmed with daughter that she is still on xarelto at home 2. Last took last evening 5. DM2 - 1. Sensitive SSI Q4H 6. HTN - 1. Resume  home BP meds if / when she wakes up and is able to take POs  DVT prophylaxis: Either restart Xarelto (if awake or eating) or will need to put on IV/[place] Code Status: Full Code per daughter in law and son - they want the DNR rescinded for the moment. Family Communication: Spoke to daughter in Sports coach on phone Disposition Plan: Home after admit Consults called: None Admission status: Place in [place], [place] Hospitalists  How to contact the [facility] Attending or Consulting provider [place] or covering provider during after hours [place], for this patient?  1. Check the care team in [facility] and look for a) attending/consulting [facility] provider listed and b) the [facility] team listed 2. Log into [email or web address]  Amion Physician Scheduling and messaging for groups and whole hospitals  On call and physician scheduling software for group practices, residents, hospitalists and other medical providers for call, clinic, rotation and shift schedules. On[employer] is a hospital-wide system for scheduling doctors and paging doctors on call. EasyPlot is for scientific plotting and data analysis.  [email or web address]  and use [place]'s universal password to access. If you do not have the password, please contact the hospital operator.  3. Locate the [facility] provider you are looking for under [facility] and page to a number that you can be directly reached. 4. If you still have difficulty reaching the provider, please page the [facility] (Director on Call) for the Hospitalists listed on amion for assistance.  05/04/2019, 5:31 AM

## 2019-05-04 NOTE — Progress Notes (Signed)
  Echocardiogram 2D Echocardiogram has been performed.  Jennette Dubin 05/04/2019, 4:10 PM

## 2019-05-04 NOTE — ED Notes (Signed)
Dinner tray ordered.

## 2019-05-04 NOTE — ED Provider Notes (Signed)
[facility] EMERGENCY DEPARTMENT Provider Note   CSN: [id number] Arrival date & time: 05/04/19  0018     History Chief Complaint  Patient presents with  . Altered LOC/Lethargic  . COVID+    LEVEL 5 CAVEAT 2/2 DEMENTIA  [name] is a 83 y.o. female.  83 y/o female with hx of history of dementia, diabetes, DVT and PE (on chronic Xarelto), previous stroke presents to the ED from home via EMS. Per EMS, patient had a syncopal episode at home. They reported GCS of 6 on arrival, but patient now more alert in the ED. Hx of COVID+ screen 1 week ago.  Spoke with daughter-in-law, [name], regarding episode tonight. Reports son was getting ready to get patient in bed and she became unresponsive. This has happened in the past and was associated with hypotension. 3-4 similar episodes in the past 1 year; sometimes syncope, sometimes near-syncope with moaning.  [name] states "sometimes she comes around quicker than others". Patient has been on Amoxicillin for UTI since Tuesday; changed from Keflex which was prescribed at time of ED discharge 1 week ago. Has not had any fevers at home. No breathing difficulty.   The history is provided by the EMS personnel.       Past Medical History:  Diagnosis Date  . Dementia ([place])   . Diabetes mellitus type II 03/21/2011  . DVT (deep venous thrombosis) ([place]) 03/21/2011  . Hyperlipidemia 03/21/2011  . Hypertension 03/21/2011  . Lymphedema of leg 03/21/2011  . Pulmonary embolism ([place]) 03/21/2011  . Stroke [facility])     Patient Active Problem List   Diagnosis Date Noted  . Pressure injury of skin 05/04/2019  . Syncope 05/04/2019  . Skin ulcer of sacrum with fat layer exposed ([place]) 07/22/2017  . Lipoma of left lower extremity 07/22/2017  . Estrogen deficiency 07/22/2017  . Hyperlipidemia associated with type 2 diabetes mellitus ([place]) 07/22/2017  . History of TIA (transient ischemic attack) 01/10/2017  . Atrial septal  aneurysm 11/22/2016  . Bilateral lower extremity edema 11/22/2016  . Iron deficiency anemia due to chronic blood loss 11/16/2016  . TIA (transient ischemic attack) 10/05/2016  . Lower urinary tract infectious disease   . Current use of long term anticoagulation 08/24/2016  . Metabolic encephalopathy 99991111  . Senile dementia without behavioral disturbance ([place]) 08/23/2016  . Acute CVA (cerebrovascular accident) ([place])   . Left-sided weakness 05/11/2016  . Dysarthria 05/11/2016  . Acute left-sided weakness 05/11/2016  . History of pulmonary embolism 03/21/2011  . History of DVT (deep vein thrombosis) 03/21/2011  . Type 2 diabetes mellitus with diabetic polyneuropathy, without long-term current use of insulin ([place]) 03/21/2011  . Lymphedema of leg 03/21/2011  . Hyperlipidemia 03/21/2011  . Hypertension 03/21/2011    Past Surgical History:  Procedure Laterality Date  . CHOLECYSTECTOMY  2015  . SKIN TAG REMOVAL  07/25/2016  . VAGINAL HYSTERECTOMY     unknown of date  . VASCULAR SURGERY       OB History    Gravida  3   Para  3   Term  3   Preterm      AB      Living  3     SAB      TAB      Ectopic      Multiple      Live Births  3           Family History  Problem Relation Age of Onset  . Heart  attack Mother   . COPD Sister   . Stroke Sister   . Bipolar disorder Daughter     Social History   Tobacco Use  . Smoking status: Never Smoker  . Smokeless tobacco: Never Used  Substance Use Topics  . Alcohol use: No  . Drug use: No    Home Medications Prior to Admission medications   Medication Sig Start Date End Date Taking? Authorizing Provider  Accu-Chek Softclix Lancets lancets 1 each by Other route 2 (two) times daily. Use as instructed Dx: E11.9 02/28/19   [name], NP  acetaminophen (TYLENOL) 500 MG tablet Take 500 mg by mouth as needed (general pain).     [provider]  atorvastatin (LIPITOR) 20 MG tablet Take 1 tablet  (20 mg total) by mouth daily at 6 PM. 01/21/19   Dewaine Oats, Carlos American, NP  Cholecalciferol (VITAMIN D-3) 125 MCG (5000 UT) TABS Take 5,000 Units by mouth daily.    [provider]  cloNIDine (CATAPRES) 0.1 MG tablet Take 1 tablet (0.1 mg total) by mouth 2 (two) times daily. May take an additional tablet if needed SBP >170 04/24/19   [name], NP  diclofenac sodium (VOLTAREN) 1 % GEL Apply 1 application topically 2 (two) times daily as needed (pain).     [provider]  gabapentin (NEURONTIN) 300 MG capsule Take 1 capsule (300 mg total) by mouth 2 (two) times daily. 04/12/19   [name], NP  glucose blood (ACCU-CHEK AVIVA PLUS) test strip 1 each by Other route 2 (two) times daily. E11.9 02/28/19   [name], NP  losartan (COZAAR) 25 MG tablet Take 1 tablet (25 mg total) by mouth daily. Take along with 50 mg to equal 75 02/26/19 05/27/19  [name], NP  losartan (COZAAR) 50 MG tablet Take 1 tablet (50 mg total) by mouth daily. Take 1 along with 25 mg to equal 75 mg total 04/12/19 07/11/19  [name], NP  Menthol-Zinc Oxide (MOISTURE BARRIER EX) Apply topically as needed.    [provider]  metFORMIN (GLUCOPHAGE) 500 MG tablet Take 1000 mg (2 tabs) by mouth in the am, and take 1 tab (500 mg)   by mouth in the evening. 02/26/19   [name], NP  nystatin (MYCOSTATIN) 100000 UNIT/ML suspension Take 5 mLs (500,000 Units total) by mouth 4 (four) times daily. 04/26/19   Rancour, Annie Main, MD  polyethylene glycol (MIRALAX / GLYCOLAX) packet Take 17 g by mouth daily.     [provider]  Probiotic Product (PROBIOTIC PO) Take 1 capsule by mouth daily.    [provider]  Rivaroxaban (XARELTO) 15 MG TABS tablet Take 1 tablet (15 mg total) by mouth daily with supper. 11/28/18   [name], NP  UNABLE TO FIND Med Name: Portable Chest X-ray 2 views to rule Aspiration Pneumonia due to cough during meals 02/27/19    Ngetich, Dinah C, NP  Vitamins A & D (VITAMIN A & D) ointment Apply 1 application topically as needed for dry skin.    [provider]  Wound Dressings (MEPILEX EX) Apply to sacrum and change every day  as needed    [provider]    Allergies    Patient has no known allergies.  Review of Systems   Review of Systems  Ten systems reviewed and are negative for acute change, except as noted in the HPI.    Physical Exam Updated Vital Signs BP 138/78  Pulse 64   Temp 98.4 F (36.9 C) (Rectal)   Resp 15   Ht 5\' 7"  (1.702 m)   Wt 59 kg   SpO2 100%   BMI 20.37 kg/m   Physical Exam Vitals and nursing note reviewed.  Constitutional:      General: She is not in acute distress.    Appearance: She is well-developed. She is not diaphoretic.     Comments: Chronically ill appearing, nontoxic. Thin/frail.  HENT:     Head: Normocephalic and atraumatic.  Eyes:     General: No scleral icterus.    Conjunctiva/sclera: Conjunctivae normal.     Pupils: Pupils are equal, round, and reactive to light.  Cardiovascular:     Rate and Rhythm: Normal rate and regular rhythm.     Pulses: Normal pulses.  Pulmonary:     Effort: Pulmonary effort is normal. No respiratory distress.     Breath sounds: No stridor. No wheezing, rhonchi or rales.     Comments: Sats 100% on room air. Abdominal:     Palpations: Abdomen is soft.     Tenderness: There is no guarding.     Comments: Soft, nondistended. No guarding.  Musculoskeletal:        General: Normal range of motion.     Cervical back: Normal range of motion.  Skin:    General: Skin is warm and dry.     Coloration: Skin is not pale.     Findings: No erythema or rash.  Neurological:     Comments: Will open eyes spontaneously and make eye contact with nursing.   Psychiatric:        Behavior: Behavior normal.     ED Results / Procedures / Treatments   Labs (all labs ordered are listed, but only abnormal results are  displayed) Labs Reviewed  CBC WITH DIFFERENTIAL/PLATELET - Abnormal; Notable for the following components:      Result Value   WBC 2.5 (*)    Hemoglobin 11.5 (*)    Neutro Abs 1.2 (*)    All other components within normal limits  BASIC METABOLIC PANEL - Abnormal; Notable for the following components:   Sodium 134 (*)    Chloride 97 (*)    Glucose, Bld 311 (*)    All other components within normal limits  CBG MONITORING, ED - Abnormal; Notable for the following components:   Glucose-Capillary 276 (*)    All other components within normal limits  URINE CULTURE  URINALYSIS, ROUTINE W REFLEX MICROSCOPIC  TROPONIN I (HIGH SENSITIVITY)  TROPONIN I (HIGH SENSITIVITY)    EKG EKG Interpretation  Date/Time:  Saturday May 04 2019 01:13:30 EST Ventricular Rate:  67 PR Interval:    QRS Duration: 97 QT Interval:  429 QTC Calculation: 453 R Axis:     Text Interpretation: Normal sinus rhythm inferior Q waves are new T wave abnormality in inferior leads are new new changes noted Confirmed by Varney Biles [id number]) on 05/04/2019 2:05:23 AM   Radiology DG Chest Port 1 View  Result Date: 05/04/2019 CLINICAL DATA:  Syncope EXAM: PORTABLE CHEST 1 VIEW COMPARISON:  Radiograph 04/26/2019 FINDINGS: Chronic interstitial changes are similar to prior. No consolidation, features of edema, pneumothorax, or effusion. The aorta is calcified. The remaining cardiomediastinal contours are unremarkable. Dextrocurvature of the thoracic spine is unchanged from priors. Degenerative changes are present in the imaged spine and shoulders. No acute osseous or soft tissue abnormality. Telemetry leads overlie the chest. IMPRESSION: No acute cardiopulmonary abnormality. Electronically Signed  By: Lovena Le M.D.   On: 05/04/2019 01:33    Procedures Procedures (including critical care time)  Medications Ordered in ED Medications  sodium chloride 0.9 % bolus 250 mL (250 mLs Intravenous New Bag/Given 05/04/19  0322)    ED Course  I have reviewed the triage vital signs and the nursing notes.  Pertinent labs & imaging results that were available during my care of the patient were reviewed by me and considered in my medical decision making (see chart for details).    MDM Rules/Calculators/A&P                       83 year old female presenting to the emergency department from home.  Lives with her daughter-in-law and experienced an episode of syncope/unresponsiveness at home.  Daughter reports that she has had 3-4 similar episodes in the past year, but felt she should be evaluated given her recent diagnosis of Covid.  She has not had any fevers or shortness of breath at home.  Patient with history of dementia and unable to contribute to history; unclear if she experienced any chest pain.  Work-up today with new leukopenia.  This may be related to the patient's recent Covid diagnosis.  Her electrolytes are reassuring.  Normal kidney function.  Mild hyperglycemia managed with gentle IV fluids.  An EKG was performed on arrival.  This shows new Q waves inferiorly in comparison to prior EKGs.  Last echocardiogram noted to be in 2018.  With history of syncope and presence of EKG changes, plan for observation admission and repeat echo.  Covid test not reordered given recent completion.  Dr. Alcario Drought to admit.   Final Clinical Impression(s) / ED Diagnoses Final diagnoses:  Syncope and collapse    Rx / DC Orders ED Discharge Orders    None       Antonietta Breach, PA-C 05/04/19 0430    Varney Biles, MD 05/04/19 [phone]

## 2019-05-04 NOTE — Procedures (Signed)
History: 83 year old female being evaluated for syncope and altered mental status  Sedation: None  Technique: This is a 21 channel routine scalp EEG performed at the bedside with bipolar and monopolar montages arranged in accordance to the international 10/20 system of electrode placement. One channel was dedicated to EKG recording.    Background: There is slowing of the background with the predominant frequency being 5 to 6 Hz generalized theta range activity, as well as diffuse generalized irregular delta range activity as well.  There is a slightly better formed posterior dominant rhythm seen at times achieving maximal frequency of 8 Hz.   Photic stimulation: Physiologic driving is performed  EEG Abnormalities: 1) generalized irregular slow activity 2) slow posterior dominant rhythm  Clinical Interpretation: This EEG is consistent with a generalized nonspecific cerebral dysfunction (encephalopathy).  There was no seizure or seizure predisposition recorded on this study. Please note that lack of epileptiform activity on EEG does not preclude the possibility of epilepsy.   Roland Rack, MD [facility] [phone]  If 7pm- 7am, please page neurology on call as listed in [place].

## 2019-05-04 NOTE — ED Notes (Signed)
Stage 2 sacral ulcer noted upon skin assessment, present PTA via EMS

## 2019-05-04 NOTE — Progress Notes (Signed)
Same day note  Patient seen and examined at bedside.  Patient was admitted to the hospital for altered mental status/syncope  At the time of my evaluation, patient was slow to respond. Calm and composed.  Spoke with the patient's daughter-in-law at the phone regarding the patient's condition.  Physical examination reveals elderly female with dementia, non focal exam, confused, slow to respond  Laboratory data and imaging was reviewed over the last 24 hours  Assessment and Plan.  Syncope . History of similar episodes in the past for 3-4 episodes x 1 year especially after eating with clammy feeling. It appears like postprandial hypotension.  Continue telemetry monitoring.  Troponins negative.  EKG was unremarkable.  EEG done at bedside was unremarkable.  2D echocardiogram has been done pending report.  Creatinine within normal limits.  CT Scan of the head did not show acute findings but chronic ischemic microvascular disease and old left cerebellar  infarct.    Partially treated enterococcal UTI.  Continue iv ampicillin.  Mild metabolic encephalopathy on the background of dementia, calm at this time.  Confused.  Continue supportive care. Has intermittent difficulty in swallowing at baseline.   Hypertension.  Accelerated at this time.  Will resume home medications as able. Prn hydralazine.  Closely monitor.  Diabetes Mellitus type II.  On Metformin at home.  Will hold.  Continue sliding scale insulin Accu-Cheks.  History of DVT, Pulmonary embolism. On xarelto.  Recent diagnosis of Covid 11 days back.  Asymptomatic from Covid illness.  Avoid steroids or remdesivir at this time.  Debility, weakness, advanced dementia. Unable to walk at baseline. Needs help with transfer. Needs assistance with everything at home. She has aide at home to help.   Sacral decubitus ulcer stage II present on admission. Continue wound care. Has home health   COVID 19 asymptomatic infection. No plan for steroids  or remdesivir. Diagnosed 11 days back and is asymptomatic.   Disposition:  I had a prolonged discussion with the sister- in- law on the phone and updated her about the clinical condition of the patient. I think she should be able to go home by tomorrow if telemetry is negative and echo is fine. I spoke with the patient's son regarding code status and he wasn't clear on it. He will inform us if the patient will be dnr. currently full code  No Charge   Signed,  Delila Pereyra, MD [facility]

## 2019-05-05 DIAGNOSIS — N390 Urinary tract infection, site not specified: Secondary | ICD-10-CM | POA: Diagnosis not present

## 2019-05-05 DIAGNOSIS — U071 COVID-19: Secondary | ICD-10-CM | POA: Diagnosis not present

## 2019-05-05 DIAGNOSIS — B952 Enterococcus as the cause of diseases classified elsewhere: Secondary | ICD-10-CM | POA: Diagnosis not present

## 2019-05-05 DIAGNOSIS — R55 Syncope and collapse: Secondary | ICD-10-CM | POA: Diagnosis not present

## 2019-05-05 LAB — BASIC METABOLIC PANEL
Anion gap: 11 (ref 5–15)
BUN: 10 mg/dL (ref 8–23)
CO2: 26 mmol/L (ref 22–32)
Calcium: 9.5 mg/dL (ref 8.9–10.3)
Chloride: 101 mmol/L (ref 98–111)
Creatinine, Ser: 0.37 mg/dL — ABNORMAL LOW (ref 0.44–1.00)
GFR calc Af Amer: >60 mL/min (ref >60)
GFR calc non Af Amer: >60 mL/min (ref >60)
Glucose, Bld: 132 mg/dL — ABNORMAL HIGH (ref 70–99)
Potassium: 4.8 mmol/L (ref 3.5–5.1)
Sodium: 138 mmol/L (ref 135–145)

## 2019-05-05 LAB — CBC
HCT: 42.2 % (ref 36.0–46.0)
Hemoglobin: 13.2 g/dL (ref 12.0–15.0)
MCH: 28.9 pg (ref 26.0–34.0)
MCHC: 31.3 g/dL (ref 30.0–36.0)
MCV: 92.5 fL (ref 80.0–100.0)
Platelets: 204 10*3/uL (ref 150–400)
RBC: 4.56 MIL/uL (ref 3.87–5.11)
RDW: 12.7 % (ref 11.5–15.5)
WBC: 2.8 10*3/uL — ABNORMAL LOW (ref 4.0–10.5)
nRBC: 0 % (ref 0.0–0.2)

## 2019-05-05 LAB — CBG MONITORING, ED
Glucose-Capillary: 104 mg/dL — ABNORMAL HIGH (ref 70–99)
Glucose-Capillary: 112 mg/dL — ABNORMAL HIGH (ref 70–99)
Glucose-Capillary: 136 mg/dL — ABNORMAL HIGH (ref 70–99)

## 2019-05-05 LAB — URINE CULTURE: Culture: <10000 — AB

## 2019-05-05 MED ORDER — AMOXICILLIN 250 MG/5ML PO SUSR: 500.0000 mg | Freq: Three times a day (TID) | ORAL | 0 refills | Status: AC

## 2019-05-05 NOTE — ED Notes (Signed)
[employer] [id number] Care Giver contact info

## 2019-05-05 NOTE — ED Notes (Signed)
Pt able to take her meds crushed and in her cereal.  Ate well with total feed. Restful prior without outward resp distress or pain responses.  Does c/o when BP cuff inflates then returns to rest.  No acute anxiety or agitation noted.  Repositioned for comfort, pt leans to the right and pillow placed under her to assist with upright position.  HOB remained elevated post eating.

## 2019-05-05 NOTE — Discharge Summary (Signed)
Physician Discharge Summary  [name] [id number] DOB: 02/20/1936 DOA: 05/04/2019  PCP: [name], NP  Admit date: 05/04/2019 Discharge date: 05/05/2019  Time spent: 50 minutes  Recommendations for Outpatient Follow-up:  1. Follow-up with [name], NP in 1 to 2 weeks.   Discharge Diagnoses:  Principal Problem:   Syncope Active Problems:   Type 2 diabetes mellitus with diabetic polyneuropathy, without long-term current use of insulin ([place])   Hypertension   Enterococcus UTI   Pressure injury of skin   COVID-19 virus RNA detected   Discharge Condition: Stable  Diet recommendation: Heart healthy/dysphagia 1 diet  Filed Weights   05/04/19 0030  Weight: 59 kg    History of present illness:  HPI per Dr. Eusebio Friendly is a 83 y.o. female with medical history significant of dementia, DM2, DVT/PE on xarelto, stroke, HTN.  For the past year or so patient has had episodes of syncope followed by lethargy and slow return to baseline per daughter-in-law.  No tremor, did have fecal incontinence with one episode.  Episodes always accompanied by a drop in blood pressure and a spike up in her blood glucose.  On the night of presentation to the ED, she had another syncope episode.  Patient remained altered and family was concerned given that patient (and family) were recently diagnosed with COVID-19 x 11 days ago, and so EMS called.  Patient had otherwise been asymptomatic from the COVID perspective, no SOB, cough, fevers, chills.  She has also been on ABx for a UTI, initially started on keflex after that ED visit before being switched a couple of days ago to amoxicillin after cultures came back for enterococcus.   ED Course: Afebrile, WBC 2.5, no other SIRS.  Satting 100% on RA with a clear CXR.  UA pending.  EKG shows new Q waves in inferior leads that wernt present on prior EKGs.  No ST changes to suggest acute ischemia nor STEMI though.  EDP  requests a syncope admission.  Hospital Course:  #1 syncope Patient had presented with syncope and noted to have had multiple episodes in the past.  It was noted that patient's family had said that patient had had episodic syncopal episodes.  Work-up done was unremarkable.  Head CT done was negative for any acute abnormalities.  No arrhythmias were noted on telemetry.  2D echo which was done was negative for any significant valvular stenosis.  Patient was placed on IV ampicillin as she was partially treated for an Enterococcus UTI.  Patient improved clinically did not have any further syncopal episodes and no focal neurological deficits.  Family was insistent and adamant to be discharged as work-up had been negative and patient was subsequently discharged home on oral amoxicillin for 5 more days to complete a course of antibiotic treatment for recently diagnosed partially treated Enterococcus UTI.  2.  Enterococcus UTI Patient noted to be partially treated for recently diagnosed Enterococcus UTI.  Patient was placed on IV ampicillin during the hospitalization and subsequently discharged home on 5 more days of oral amoxicillin to complete a full course of antibiotic treatment.  Outpatient follow-up with PCP.  3.  COVID-19 infection Patient noted to have tested positive for COVID-19 11 days prior to admission.  Patient aside from presentation with syncope has been asymptomatic.  Chest x-ray done was negative for any acute infiltrates.  Patient remained afebrile.  Patient was not hypoxic.  And patient required no treatment.  Patient be discharged home in stable condition.  4.  History of DVT/PE Maintained on Xarelto.  Outpatient follow-up.  The rest of patient's chronic medical issues remained stable.  Procedures:  CT head 05/04/2019  Chest x-ray 05/04/2019  2D echo 05/04/2019  Consultations:  Wound care  Discharge Exam: Vitals:   05/05/19 1100 05/05/19 1200  BP: (!) 154/88 (!) 143/70   Pulse: 81 91  Resp: 16 11  Temp:    SpO2: 92% (!) 86%    General: NAD Cardiovascular: RRR Respiratory: CTAB  Discharge Instructions   Discharge Instructions    Diet - low sodium heart healthy   Complete by: As directed    Dysphagia 1 diet   Discharge instructions   Complete by: As directed    ?   Person Under Monitoring Name: [name]  Location: [place] [id number]   Infection Prevention Recommendations for Individuals Confirmed to have, or Being Evaluated for, 2019 Novel Coronavirus (COVID-19) Infection Who Receive Care at Home  Individuals who are confirmed to have, or are being evaluated for, COVID-19 should follow the prevention steps below until a healthcare provider or local or state health department says they can return to normal activities.  Stay home except to get medical care You should restrict activities outside your home, except for getting medical care. Do not go to work, school, or public areas, and do not use public transportation or taxis.  Call ahead before visiting your doctor Before your medical appointment, call the healthcare provider and tell them that you have, or are being evaluated for, COVID-19 infection. This will help the healthcare provider's office take steps to keep other people from getting infected. Ask your healthcare provider to call the local or state health department.  Monitor your symptoms Seek prompt medical attention if your illness is worsening (e.g., difficulty breathing). Before going to your medical appointment, call the healthcare provider and tell them that you have, or are being evaluated for, COVID-19 infection. Ask your healthcare provider to call the local or state health department.  Wear a facemask You should wear a facemask that covers your nose and mouth when you are in the same room with other people and when you visit a healthcare provider. People who live with or visit you should  also wear a facemask while they are in the same room with you.  Separate yourself from other people in your home As much as possible, you should stay in a different room from other people in your home. Also, you should use a separate bathroom, if available.  Avoid sharing household items You should not share dishes, drinking glasses, cups, eating utensils, towels, bedding, or other items with other people in your home. After using these items, you should wash them thoroughly with soap and water.  Cover your coughs and sneezes Cover your mouth and nose with a tissue when you cough or sneeze, or you can cough or sneeze into your sleeve. Throw used tissues in a lined trash can, and immediately wash your hands with soap and water for at least 20 seconds or use an alcohol-based hand rub.  Wash your [facility] your hands often and thoroughly with soap and water for at least 20 seconds. You can use an alcohol-based hand sanitizer if soap and water are not available and if your hands are not visibly dirty. Avoid touching your eyes, nose, and mouth with unwashed hands.   Prevention Steps for Caregivers and Household Members of Individuals Confirmed to have, or Being Evaluated for, COVID-19 Infection  Being Cared for in the Home  If you live with, or provide care at home for, a person confirmed to have, or being evaluated for, COVID-19 infection please follow these guidelines to prevent infection:  Follow healthcare provider's instructions Make sure that you understand and can help the patient follow any healthcare provider instructions for all care.  Provide for the patient's basic needs You should help the patient with basic needs in the home and provide support for getting groceries, prescriptions, and other personal needs.  Monitor the patient's symptoms If they are getting sicker, call his or her medical provider and tell them that the patient has, or is being evaluated for, COVID-19  infection. This will help the healthcare provider's office take steps to keep other people from getting infected. Ask the healthcare provider to call the local or state health department.  Limit the number of people who have contact with the patient If possible, have only one caregiver for the patient. Other household members should stay in another home or place of residence. If this is not possible, they should stay in another room, or be separated from the patient as much as possible. Use a separate bathroom, if available. Restrict visitors who do not have an essential need to be in the home.  Keep older adults, very young children, and other sick people away from the patient Keep older adults, very young children, and those who have compromised immune systems or chronic health conditions away from the patient. This includes people with chronic heart, lung, or kidney conditions, diabetes, and cancer.  Ensure good ventilation Make sure that shared spaces in the home have good air flow, such as from an air conditioner or an opened window, weather permitting.  Wash your hands often Wash your hands often and thoroughly with soap and water for at least 20 seconds. You can use an alcohol based hand sanitizer if soap and water are not available and if your hands are not visibly dirty. Avoid touching your eyes, nose, and mouth with unwashed hands. Use disposable paper towels to dry your hands. If not available, use dedicated cloth towels and replace them when they become wet.  Wear a facemask and gloves Wear a disposable facemask at all times in the room and gloves when you touch or have contact with the patient's blood, body fluids, and/or secretions or excretions, such as sweat, saliva, sputum, nasal mucus, vomit, urine, or feces.  Ensure the mask fits over your nose and mouth tightly, and do not touch it during use. Throw out disposable facemasks and gloves after using them. Do not reuse. Wash  your hands immediately after removing your facemask and gloves. If your personal clothing becomes contaminated, carefully remove clothing and launder. Wash your hands after handling contaminated clothing. Place all used disposable facemasks, gloves, and other waste in a lined container before disposing them with other household waste. Remove gloves and wash your hands immediately after handling these items.  Do not share dishes, glasses, or other household items with the patient Avoid sharing household items. You should not share dishes, drinking glasses, cups, eating utensils, towels, bedding, or other items with a patient who is confirmed to have, or being evaluated for, COVID-19 infection. After the person uses these items, you should wash them thoroughly with soap and water.  Wash laundry thoroughly Immediately remove and wash clothes or bedding that have blood, body fluids, and/or secretions or excretions, such as sweat, saliva, sputum, nasal mucus, vomit, urine, or feces, on  them. Wear gloves when handling laundry from the patient. Read and follow directions on labels of laundry or clothing items and detergent. In general, wash and dry with the warmest temperatures recommended on the label.  Clean all areas the individual has used often Clean all touchable surfaces, such as counters, tabletops, doorknobs, bathroom fixtures, toilets, phones, keyboards, tablets, and bedside tables, every day. Also, clean any surfaces that may have blood, body fluids, and/or secretions or excretions on them. Wear gloves when cleaning surfaces the patient has come in contact with. Use a diluted bleach solution (e.g., dilute bleach with 1 part bleach and 10 parts water) or a household disinfectant with a label that says EPA-registered for coronaviruses. To make a bleach solution at home, add 1 tablespoon of bleach to 1 quart (4 cups) of water. For a larger supply, add  cup of bleach to 1 gallon (16 cups) of  water. Read labels of cleaning products and follow recommendations provided on product labels. Labels contain instructions for safe and effective use of the cleaning product including precautions you should take when applying the product, such as wearing gloves or eye protection and making sure you have good ventilation during use of the product. Remove gloves and wash hands immediately after cleaning.  Monitor yourself for signs and symptoms of illness Caregivers and household members are considered close contacts, should monitor their health, and will be asked to limit movement outside of the home to the extent possible. Follow the monitoring steps for close contacts listed on the symptom monitoring form.   ? If you have additional questions, contact your local health department or call the epidemiologist on call at [phone] (available 24/7). ? This guidance is subject to change. For the most up-to-date guidance from [facility], please refer to their website: [email or web address].pl   Increase activity slowly   Complete by: As directed    MyChart COVID-19 home monitoring program   Complete by: May 05, 2019    Is the patient willing to use the [place] for home monitoring?: Yes   Temperature monitoring   Complete by: May 05, 2019    After how many days would you like to receive a notification of this patient's flowsheet entries?: 1     Allergies as of 05/05/2019   No Known Allergies     Medication List    TAKE these medications   Accu-Chek Aviva Plus test strip Generic drug: glucose blood 1 each by Other route 2 (two) times daily. E11.9   Accu-Chek Softclix Lancets lancets 1 each by Other route 2 (two) times daily. Use as instructed Dx: E11.9   acetaminophen 500 MG tablet Commonly known as: TYLENOL Take 500 mg by mouth every 8 (eight) hours as needed (for general pain).   amoxicillin 250 MG/5ML suspension Commonly  known as: AMOXIL Take 10 mLs (500 mg total) by mouth 3 (three) times daily for 5 days.   atorvastatin 20 MG tablet Commonly known as: LIPITOR Take 1 tablet (20 mg total) by mouth daily at 6 PM. What changed: when to take this   cloNIDine 0.1 MG tablet Commonly known as: CATAPRES Take 1 tablet (0.1 mg total) by mouth 2 (two) times daily. May take an additional tablet if needed SBP >170 What changed:   when to take this  additional instructions   diclofenac sodium 1 % Gel Commonly known as: VOLTAREN Apply 1 application topically 2 (two) times daily as needed (pain).   gabapentin 300 MG capsule Commonly known as: NEURONTIN  Take 1 capsule (300 mg total) by mouth 2 (two) times daily.   losartan 25 MG tablet Commonly known as: COZAAR Take 1 tablet (25 mg total) by mouth daily. Take along with 50 mg to equal 75 What changed: additional instructions   losartan 50 MG tablet Commonly known as: COZAAR Take 1 tablet (50 mg total) by mouth daily. Take 1 along with 25 mg to equal 75 mg total What changed: additional instructions   MEPILEX EX Apply 1 patch topically See admin instructions. Apply to sacrum and change one to two times daily   metFORMIN 500 MG tablet Commonly known as: GLUCOPHAGE Take 1000 mg (2 tabs) by mouth in the am, and take 1 tab (500 mg)   by mouth in the evening. What changed:   how much to take  how to take this  when to take this  additional instructions   MOISTURE BARRIER EX Apply 1 application topically See admin instructions. Apply to sacral wound one to two times daily   NON FORMULARY Take 1 capsule by mouth See admin instructions. Metagenics - UltraFlora Balance Daily Probiotic Immune Support* capsules- Take 1 capsule by mouth once a day   nystatin 100000 UNIT/ML suspension Commonly known as: MYCOSTATIN Take 5 mLs (500,000 Units total) by mouth 4 (four) times daily.   polyethylene glycol 17 g packet Commonly known as: MIRALAX /  GLYCOLAX Take 17 g by mouth daily as needed for mild constipation (MIX AND DRINK).   Rivaroxaban 15 MG Tabs tablet Commonly known as: Xarelto Take 1 tablet (15 mg total) by mouth daily with supper.   UNABLE TO FIND Med Name: Portable Chest X-ray 2 views to rule Aspiration Pneumonia due to cough during meals   vitamin A & D ointment Apply 1 application topically as needed for dry skin (to feet or other affected areas).   Vitamin D-3 125 MCG (5000 UT) Tabs Take 5,000 Units by mouth daily.      No Known Allergies Follow-up Information    [name], NP. Schedule an appointment as soon as possible for a visit in 2 week(s).   Specialty: Geriatric Medicine Why: f/u in 1-2 weeks. Contact information: [address] [place] [address] [phone]            The results of significant diagnostics from this hospitalization (including imaging, microbiology, ancillary and laboratory) are listed below for reference.    Significant Diagnostic Studies: CT HEAD WO CONTRAST  Result Date: 05/04/2019 CLINICAL DATA:  Encephalopathy.  Syncopal episode. EXAM: CT HEAD WITHOUT CONTRAST TECHNIQUE: Contiguous axial images were obtained from the base of the skull through the vertex without intravenous contrast. COMPARISON:  04/26/2019 and 01/12/2019 FINDINGS: Brain: Examination demonstrates age related atrophic change and moderate chronic ischemic microvascular disease. Old left cerebellar infarct. Couple small old central lacunar infarcts. No mass, mass effect, shift of midline structures or acute hemorrhage. No evidence of acute infarction. Vascular: No hyperdense vessel or unexpected calcification. Skull: Normal. Negative for fracture or focal lesion. Sinuses/Orbits: No acute finding. Other: None. IMPRESSION: 1.  No acute findings. 2. Moderate chronic ischemic microvascular disease and age related atrophic change. Old left cerebellar infarct. Few small old central lacunar infarcts.  Electronically Signed   By: Marin Olp M.D.   On: 05/04/2019 07:59   CT Head Wo Contrast  Result Date: 04/26/2019 CLINICAL DATA:  Decreased p.o. intake, decreased verbal responsiveness. EXAM: CT HEAD WITHOUT CONTRAST TECHNIQUE: Contiguous axial images were obtained from the base of the skull  through the vertex without intravenous contrast. COMPARISON:  Is 01/12/2019 FINDINGS: Brain: Extensive atrophy and chronic microvascular ischemic change without signs of acute intracranial process. No signs of hydrocephalus, mass, midline shift or intracranial hemorrhage. Signs of previous left cerebellar infarcts are similar to the prior study. Vascular: No hyperdense vessel or unexpected calcification. Skull: Normal. Negative for fracture or focal lesion. Sinuses/Orbits: Visualized portions of sinuses and orbits are unremarkable. Other: None. IMPRESSION: 1. No acute intracranial finding. 2. Signs of remote left cerebellar infarct and advanced age related atrophy with chronic microvascular ischemic changes. Electronically Signed   By: Zetta Bills M.D.   On: 04/26/2019 19:41   DG Chest Port 1 View  Result Date: 05/04/2019 CLINICAL DATA:  Syncope EXAM: PORTABLE CHEST 1 VIEW COMPARISON:  Radiograph 04/26/2019 FINDINGS: Chronic interstitial changes are similar to prior. No consolidation, features of edema, pneumothorax, or effusion. The aorta is calcified. The remaining cardiomediastinal contours are unremarkable. Dextrocurvature of the thoracic spine is unchanged from priors. Degenerative changes are present in the imaged spine and shoulders. No acute osseous or soft tissue abnormality. Telemetry leads overlie the chest. IMPRESSION: No acute cardiopulmonary abnormality. Electronically Signed   By: Lovena Le M.D.   On: 05/04/2019 01:33   DG Chest Portable 1 View  Result Date: 04/26/2019 CLINICAL DATA:  Altered mental status EXAM: PORTABLE CHEST 1 VIEW COMPARISON:  05/15/2018 FINDINGS: Heart and mediastinal  contours are within normal limits. No focal opacities or effusions. No acute bony abnormality. IMPRESSION: No active disease. Electronically Signed   By: Rolm Baptise M.D.   On: 04/26/2019 19:41   EEG adult  Result Date: 05/04/2019 Greta Doom, MD     05/04/2019  1:57 PM History: 83 year old female being evaluated for syncope and altered mental status Sedation: None Technique: This is a 21 channel routine scalp EEG performed at the bedside with bipolar and monopolar montages arranged in accordance to the international 10/20 system of electrode placement. One channel was dedicated to EKG recording. Background: There is slowing of the background with the predominant frequency being 5 to 6 Hz generalized theta range activity, as well as diffuse generalized irregular delta range activity as well.  There is a slightly better formed posterior dominant rhythm seen at times achieving maximal frequency of 8 Hz. Photic stimulation: Physiologic driving is performed EEG Abnormalities: 1) generalized irregular slow activity 2) slow posterior dominant rhythm Clinical Interpretation: This EEG is consistent with a generalized nonspecific cerebral dysfunction (encephalopathy).  There was no seizure or seizure predisposition recorded on this study. Please note that lack of epileptiform activity on EEG does not preclude the possibility of epilepsy. Roland Rack, MD [facility] [phone] If 7pm- 7am, please page neurology on call as listed in [place].   ECHOCARDIOGRAM COMPLETE  Result Date: 05/04/2019   ECHOCARDIOGRAM REPORT   Patient Name:   CECILA HACKBARTH Date of Exam: 05/04/2019 Medical Rec #:  [id number]     Height:       67.0 in Accession #:    [id number]    Weight:       130.1 lb Date of Birth:  1935/12/18      BSA:          1.68 m Patient Age:    89 years      BP:           217/91 mmHg Patient Gender: F             HR:  61 bpm. Exam Location:  Inpatient Procedure: 2D Echo  Indications:    Syncope R55; A-fib  History:        Patient has prior history of Echocardiogram examinations, most                 recent 10/05/2016. Risk Factors:Hypertension, Dyslipidemia and                 Diabetes.  Sonographer:    Mikki Santee RDCS (AE) Referring Phys: [place]  1. Left ventricular ejection fraction, by visual estimation, is 60 to 65%. The left ventricle has normal function. There is mildly increased left ventricular hypertrophy.  2. Left ventricular diastolic parameters are consistent with Grade I diastolic dysfunction (impaired relaxation).  3. The left ventricle has no regional wall motion abnormalities.  4. Global right ventricle has normal systolic function.The right ventricular size is normal. No increase in right ventricular wall thickness.  5. Left atrial size was normal.  6. Right atrial size was normal.  7. The mitral valve is normal in structure. Mild mitral valve regurgitation. No evidence of mitral stenosis.  8. The tricuspid valve is normal in structure.  9. The aortic valve is normal in structure. Aortic valve regurgitation is not visualized. Mild aortic valve sclerosis without stenosis. 10. The pulmonic valve was normal in structure. Pulmonic valve regurgitation is not visualized. 11. The inferior vena cava is normal in size with greater than 50% respiratory variability, suggesting right atrial pressure of 3 mmHg. FINDINGS  Left Ventricle: Left ventricular ejection fraction, by visual estimation, is 60 to 65%. The left ventricle has normal function. The left ventricle has no regional wall motion abnormalities. There is mildly increased left ventricular hypertrophy. Left ventricular diastolic parameters are consistent with Grade I diastolic dysfunction (impaired relaxation). Normal left atrial pressure. Right Ventricle: The right ventricular size is normal. No increase in right ventricular wall thickness. Global RV systolic function is has normal  systolic function. Left Atrium: Left atrial size was normal in size. Right Atrium: Right atrial size was normal in size Pericardium: There is no evidence of pericardial effusion. Mitral Valve: The mitral valve is normal in structure. Mild mitral valve regurgitation. No evidence of mitral valve stenosis by observation. Tricuspid Valve: The tricuspid valve is normal in structure. Tricuspid valve regurgitation is not demonstrated. Aortic Valve: The aortic valve is normal in structure. Aortic valve regurgitation is not visualized. Mild aortic valve sclerosis is present, with no evidence of aortic valve stenosis. Pulmonic Valve: The pulmonic valve was normal in structure. Pulmonic valve regurgitation is not visualized. Pulmonic regurgitation is not visualized. Aorta: The aortic root, ascending aorta and aortic arch are all structurally normal, with no evidence of dilitation or obstruction. Venous: The inferior vena cava is normal in size with greater than 50% respiratory variability, suggesting right atrial pressure of 3 mmHg. IAS/Shunts: No atrial level shunt detected by color flow Doppler. There is no evidence of a patent foramen ovale. No ventricular septal defect is seen or detected. There is no evidence of an atrial septal defect.  LEFT VENTRICLE PLAX 2D LVIDd:         3.20 cm Diastology LVIDs:         2.60 cm LV e' medial:   4.19 cm/s LV PW:         1.30 cm LV E/e' medial: 13.1 LV IVS:        1.50 cm LV SV:         16  ml LV SV Index:   9.82  LEFT ATRIUM         Index LA diam:    2.20 cm 1.31 cm/m   AORTA Ao Root diam: 2.80 cm MITRAL VALVE MV Area (PHT): 1.96 cm MV PHT:        112.23 msec MV Decel Time: 387 msec MV E velocity: 54.80 cm/s 103 cm/s MV A velocity: 52.80 cm/s 70.3 cm/s MV E/A ratio:  1.04       1.5  Candee Furbish MD Electronically signed by Candee Furbish MD Signature Date/Time: 05/04/2019/4:27:16 PM    Final     Microbiology: Recent Results (from the past 240 hour(s))  Blood culture (routine x 2)      Status: None   Collection Time: 04/26/19  6:31 PM   Specimen: BLOOD  Result Value Ref Range Status   Specimen Description   Final    BLOOD BLOOD LEFT WRIST Performed at [facility], [place] [address] [place], [place] [address]    Special Requests   Final    BOTTLES DRAWN AEROBIC AND ANAEROBIC Blood Culture adequate volume Performed at [facility], [place] [address], [place], [place] [address]    Culture   Final    NO GROWTH 5 DAYS Performed at [facility], [place] [address], [place], [place] [address]    Report Status 05/01/2019 FINAL  Final  Blood culture (routine x 2)     Status: None   Collection Time: 04/26/19  6:36 PM   Specimen: BLOOD  Result Value Ref Range Status   Specimen Description   Final    BLOOD LEFT ANTECUBITAL Performed at [facility], [place] [address] [place], [place] [address]    Special Requests   Final    BOTTLES DRAWN AEROBIC ONLY Blood Culture results may not be optimal due to an inadequate volume of blood received in culture bottles Performed at [facility], [address] [place], [place] [address]    Culture   Final    NO GROWTH 5 DAYS Performed at [facility], [place] [address] [place], [place] [address]    Report Status 05/01/2019 FINAL  Final  Urine culture     Status: Abnormal   Collection Time: 04/26/19  6:43 PM   Specimen: Urine, Random  Result Value Ref Range Status   Specimen Description   Final    URINE, RANDOM Performed at [facility], [place] [address], [place], [place] [address]    Special Requests   Final    NONE Performed at [facility], [place] [address], [place], [place] [address]    Culture >=100,000 COLONIES/mL ENTEROCOCCUS FAECALIS (A)  Final   Report Status 04/29/2019 FINAL  Final   Organism ID, Bacteria ENTEROCOCCUS FAECALIS (A)  Final      Susceptibility   Enterococcus  faecalis - MIC*    AMPICILLIN <=2 SENSITIVE Sensitive     NITROFURANTOIN <=16 SENSITIVE Sensitive     VANCOMYCIN 2 SENSITIVE Sensitive     * >=100,000 COLONIES/mL ENTEROCOCCUS FAECALIS  Urine culture     Status: Abnormal   Collection Time: 05/04/19 12:35 AM   Specimen: Urine, Random  Result Value Ref Range Status   Specimen Description URINE, RANDOM  Final   Special Requests NONE  Final   Culture (A)  Final    <10,000 COLONIES/mL INSIGNIFICANT GROWTH Performed at [facility], [place]  [address], [place], [place] [address]    Report Status 05/05/2019 FINAL  Final  SARS CORONAVIRUS 2 (TAT 6-24 HRS) Nasopharyngeal Nasopharyngeal Swab     Status: Abnormal   Collection Time: 05/04/19 10:05 AM   Specimen: Nasopharyngeal Swab  Result Value Ref Range Status   SARS Coronavirus 2 POSITIVE (A) NEGATIVE Final    Comment: RESULT CALLED TO, READ BACK BY AND VERIFIED WITH: PAIGE PULLIAM RN.@[email or web address]  ON 12.26.2020 BY TCALDWELL MT. (NOTE) SARS-CoV-2 target nucleic acids are DETECTED. The SARS-CoV-2 RNA is generally detectable in upper and lower respiratory specimens during the acute phase of infection. Positive results are indicative of the presence of SARS-CoV-2 RNA. Clinical correlation with patient history and other diagnostic information is  necessary to determine patient infection status. Positive results do not rule out bacterial infection or co-infection with other viruses.  The expected result is Negative. Fact Sheet for Patients: SugarRoll.be Fact Sheet for Healthcare Providers: https://www.woods-mathews.com/ This test is not yet approved or cleared by the [place] FDA and  has been authorized for detection and/or diagnosis of SARS-CoV-2 by FDA under an Emergency Use Authorization (EUA). This EUA will remain  in effect (meaning this test ca n be used) for the duration of the COVID-19 declaration under Section 564(b)(1) of the Act, 21  U.S.C. section 360bbb-3(b)(1), unless the authorization is terminated or revoked sooner. Performed at [facility], [place] [address] [place], [place] [address]      Labs: Basic Metabolic Panel: Recent Labs  Lab 05/04/19 0035 05/05/19 0623  NA 134* 138  K 4.5 4.8  CL 97* 101  CO2 25 26  GLUCOSE 311* 132*  BUN 17 10  CREATININE 0.57 0.37*  CALCIUM 9.2 9.5   Liver Function Tests: No results for input(s): AST, ALT, ALKPHOS, BILITOT, PROT, ALBUMIN in the last 168 hours. No results for input(s): LIPASE, AMYLASE in the last 168 hours. No results for input(s): AMMONIA in the last 168 hours. CBC: Recent Labs  Lab 05/04/19 0035 05/05/19 0929  WBC 2.5* 2.8*  NEUTROABS 1.2*  --   HGB 11.5* 13.2  HCT 36.4 42.2  MCV 92.4 92.5  PLT 173 204   Cardiac Enzymes: No results for input(s): CKTOTAL, CKMB, CKMBINDEX, TROPONINI in the last 168 hours. BNP: BNP (last 3 results) No results for input(s): BNP in the last 8760 hours.  ProBNP (last 3 results) No results for input(s): PROBNP in the last 8760 hours.  CBG: Recent Labs  Lab 05/04/19 1823 05/04/19 2249 05/05/19 0040 05/05/19 0613 05/05/19 0941  GLUCAP 79 126* 136* 104* 112*       Signed:  Irine Seal MD.  [facility] 05/05/2019, 12:56 PM

## 2019-05-05 NOTE — ED Notes (Signed)
Pt incontinent of urine, purewick repositioned. Pt cleaned, linens changed, and pt repositioned.

## 2019-05-06 ENCOUNTER — Encounter: Payer: Self-pay | Admitting: Nurse Practitioner

## 2019-05-06 ENCOUNTER — Ambulatory Visit (INDEPENDENT_AMBULATORY_CARE_PROVIDER_SITE_OTHER): Payer: Medicare Other | Admitting: Nurse Practitioner

## 2019-05-06 ENCOUNTER — Other Ambulatory Visit: Payer: Self-pay

## 2019-05-06 DIAGNOSIS — B370 Candidal stomatitis: Secondary | ICD-10-CM | POA: Diagnosis not present

## 2019-05-06 DIAGNOSIS — U071 COVID-19: Secondary | ICD-10-CM

## 2019-05-06 DIAGNOSIS — R55 Syncope and collapse: Secondary | ICD-10-CM

## 2019-05-06 DIAGNOSIS — E1142 Type 2 diabetes mellitus with diabetic polyneuropathy: Secondary | ICD-10-CM | POA: Diagnosis not present

## 2019-05-06 DIAGNOSIS — N390 Urinary tract infection, site not specified: Secondary | ICD-10-CM | POA: Diagnosis not present

## 2019-05-06 DIAGNOSIS — I10 Essential (primary) hypertension: Secondary | ICD-10-CM | POA: Diagnosis not present

## 2019-05-06 DIAGNOSIS — B952 Enterococcus as the cause of diseases classified elsewhere: Secondary | ICD-10-CM | POA: Diagnosis not present

## 2019-05-06 DIAGNOSIS — E785 Hyperlipidemia, unspecified: Secondary | ICD-10-CM | POA: Diagnosis not present

## 2019-05-06 DIAGNOSIS — E1169 Type 2 diabetes mellitus with other specified complication: Secondary | ICD-10-CM

## 2019-05-06 LAB — HEMOGLOBIN A1C
Hgb A1c MFr Bld: 7.9 % — ABNORMAL HIGH (ref 4.8–5.6)
Mean Plasma Glucose: 180 mg/dL

## 2019-05-06 MED ORDER — ATORVASTATIN CALCIUM 20 MG PO TABS: 20.0000 mg | ORAL_TABLET | Freq: Every day | ORAL | 1 refills | Status: DC

## 2019-05-06 MED ORDER — LOSARTAN POTASSIUM 25 MG PO TABS: 25.0000 mg | ORAL_TABLET | Freq: Every day | ORAL | 1 refills | Status: DC

## 2019-05-06 NOTE — Telephone Encounter (Signed)
VOID, opened in error

## 2019-05-06 NOTE — Progress Notes (Signed)
This service is provided via telemedicine  No vital signs collected/recorded due to the encounter was a telemedicine visit.   Location of patient (ex: home, work): Home  Patient consents to a telephone visit: Yes  Location of the provider (ex: office, home): [place]  Name of any referring provider:  N/A  Names of all persons participating in the telemedicine service and their role in the encounter:  S.Chrae B/CMA, [name], NP, [name] (relative), and Patient   Time spent on call:  13 min with medical assistant      Careteam: Patient Care Team: [name], NP as PCP - General (Geriatric Medicine) [name], MD as Consulting Physician (Oncology) [name], [name], LCSW as Social Worker (Licensed Clinical Social Worker) [name], RN as Registered Nurse ([facility] [place])  Advanced Directive information    No Known Allergies  Chief Complaint  Patient presents with  . Follow-up    Covid follow-up. Per [name] patient is doing better in regards to covid diagnosis. Telehealth visit   . Medication Management    Discuss Amoxicillin and nystatin stop date.      HPI: Patient is a 83 y.o. female via televisit for follow up COVID Pt had a syncopal episode 3 days ago and due to recent COVID diagnosis family sent her to the ED. (patrice wished they would have not sent her looking back because everything checked out okay) they did a thorough work up.  Thought syncopal episode due to drop in BP, she has a variable bp at times. Doctor recommended compression hose but she can not tolerating that. Ongoing LE edema  Oxygen and temperature remain normal.  Eating well without coughing or "choking"  No cough or congestion No shortness of breath   Amoxicillin was instructed to talk until 05/10/2019 per ED discharge summary due to UTI.  Ritta Slot is doing better.   Blood pressure controlled since she has been home.   Dm- talking metformin 500 mg  twice daily, daughter in law will increase to 1000 mg if blood sugar is high. Generally blood sugars are under 150.   Open area on leg has healed. - went to wound care x 3 visits and it has completed healed at this time.   Review of Systems:  Review of Systems  Unable to perform ROS: Dementia   Past Medical History:  Diagnosis Date  . Dementia ([place])   . Diabetes mellitus type II 03/21/2011  . DVT (deep venous thrombosis) ([place]) 03/21/2011  . Hyperlipidemia 03/21/2011  . Hypertension 03/21/2011  . Lymphedema of leg 03/21/2011  . Pulmonary embolism ([place]) 03/21/2011  . Stroke [facility])    Past Surgical History:  Procedure Laterality Date  . CHOLECYSTECTOMY  2015  . SKIN TAG REMOVAL  07/25/2016  . VAGINAL HYSTERECTOMY     unknown of date  . VASCULAR SURGERY     Social History:   reports that she has never smoked. She has never used smokeless tobacco. She reports that she does not drink alcohol or use drugs.  Family History  Problem Relation Age of Onset  . Heart attack Mother   . COPD Sister   . Stroke Sister   . Bipolar disorder Daughter     Medications: Patient's Medications  New Prescriptions   No medications on file  Previous Medications   ACCU-CHEK SOFTCLIX LANCETS LANCETS    1 each by Other route 2 (two) times daily. Use as instructed Dx: E11.9   ACETAMINOPHEN (TYLENOL) 500  MG TABLET    Take 500 mg by mouth every 8 (eight) hours as needed (for general pain).    AMOXICILLIN (AMOXIL) 250 MG/5ML SUSPENSION    Take 10 mLs (500 mg total) by mouth 3 (three) times daily for 5 days.   ATORVASTATIN (LIPITOR) 20 MG TABLET    Take 1 tablet (20 mg total) by mouth daily at 6 PM.   CHOLECALCIFEROL (VITAMIN D-3) 125 MCG (5000 UT) TABS    Take 5,000 Units by mouth daily.   CLONIDINE (CATAPRES) 0.1 MG TABLET    Take 1 tablet (0.1 mg total) by mouth 2 (two) times daily. May take an additional tablet if needed SBP >170   DICLOFENAC SODIUM (VOLTAREN) 1 % GEL    Apply 1 application topically  2 (two) times daily as needed (pain).    GABAPENTIN (NEURONTIN) 300 MG CAPSULE    Take 1 capsule (300 mg total) by mouth 2 (two) times daily.   GLUCOSE BLOOD (ACCU-CHEK AVIVA PLUS) TEST STRIP    1 each by Other route 2 (two) times daily. E11.9   LOSARTAN (COZAAR) 25 MG TABLET    Take 1 tablet (25 mg total) by mouth daily. Take along with 50 mg to equal 75   LOSARTAN (COZAAR) 50 MG TABLET    Take 1 tablet (50 mg total) by mouth daily. Take 1 along with 25 mg to equal 75 mg total   MENTHOL-ZINC OXIDE (MOISTURE BARRIER EX)    Apply 1 application topically See admin instructions. Apply to sacral wound one to two times daily   METFORMIN (GLUCOPHAGE) 500 MG TABLET    Take 500 mg by mouth 2 (two) times daily with a meal. If blood sugar greater than 160 in the morning patient will take 2 tablets (1000mg ) in the am   NON FORMULARY    Take 1 capsule by mouth See admin instructions. Metagenics - UltraFlora Balance Daily Probiotic Immune Support* capsules- Take 1 capsule by mouth once a day   NYSTATIN (MYCOSTATIN) 100000 UNIT/ML SUSPENSION    Take 5 mLs (500,000 Units total) by mouth 4 (four) times daily.   POLYETHYLENE GLYCOL (MIRALAX / GLYCOLAX) PACKET    Take 17 g by mouth daily as needed for mild constipation (MIX AND DRINK).    RIVAROXABAN (XARELTO) 15 MG TABS TABLET    Take 1 tablet (15 mg total) by mouth daily with supper.   VITAMINS A & D (VITAMIN A & D) OINTMENT    Apply 1 application topically as needed for dry skin (to feet or other affected areas).    WOUND DRESSINGS (MEPILEX EX)    Apply 1 patch topically See admin instructions. Apply to sacrum and change one to two times daily  Modified Medications   No medications on file  Discontinued Medications   METFORMIN (GLUCOPHAGE) 500 MG TABLET    Take 1000 mg (2 tabs) by mouth in the am, and take 1 tab (500 mg)   by mouth in the evening.   UNABLE TO FIND    Med Name: Portable Chest X-ray 2 views to rule Aspiration Pneumonia due to cough during meals     Physical Exam:  There were no vitals filed for this visit. There is no height or weight on file to calculate BMI. Wt Readings from Last 3 Encounters:  05/04/19 130 lb 1.1 oz (59 kg)  11/02/18 130 lb (59 kg)  01/30/18 130 lb (59 kg)      Labs reviewed: Basic Metabolic Panel: Recent Labs  04/26/19 1843 05/04/19 0035 05/04/19 0704 05/05/19 0623  NA 135 134*  --  138  K 4.1 4.5  --  4.8  CL 96* 97*  --  101  CO2 27 25  --  26  GLUCOSE 130* 311*  --  132*  BUN 20 17  --  10  CREATININE 0.40* 0.57  --  0.37*  CALCIUM 9.4 9.2  --  9.5  TSH  --   --  2.756  --    Liver Function Tests: Recent Labs    01/11/19 2004 01/27/19 1636 04/26/19 1843  AST 16 17 18   ALT 14 12 14   ALKPHOS 76 65 66  BILITOT 0.6 0.1* 0.5  PROT 7.5 6.0* 6.6  ALBUMIN 3.4* 3.0* 3.6   No results for input(s): LIPASE, AMYLASE in the last 8760 hours. No results for input(s): AMMONIA in the last 8760 hours. CBC: Recent Labs    02/25/19 1518 04/26/19 1843 05/04/19 0035 05/05/19 0929  WBC 4.1 4.0 2.5* 2.8*  NEUTROABS 2,341 2.1 1.2*  --   HGB 10.8* 11.6* 11.5* 13.2  HCT 33.6* 38.1 36.4 42.2  MCV 91.3 94.3 92.4 92.5  PLT 207 174 173 204   Lipid Panel: Recent Labs    09/03/18 1143  CHOL 173  HDL 52  LDLCALC 103*  TRIG 90  CHOLHDL 3.3   TSH: Recent Labs    05/04/19 0704  TSH 2.756   A1C: Lab Results  Component Value Date   HGBA1C 7.9 (H) 05/04/2019     Assessment/Plan 1. Syncope, unspecified syncope type -thorough work up provided by ED, she was kept in the hospital for observation without further episodes. Eating and drinking well at this time. Mental status at baseline.   2. COVID-19 virus RNA detected First positive test was 04/26/2019, also tested positive on 05/04/2019, without worsening of symptoms and overall doing well from this. O2 sats maintaining and without fever, shortness of breath, cough or congestion.  3. Oral thrush Improving on nystatin, to continue  through use of antibiotic on 05/10/2019  4. Essential hypertension Controlled at this time. No more episodes of drop in blood pressure or syncope. Requesting refill for losartan 25 tablets to use with 50 mg to equal 75 mg daily. - losartan (COZAAR) 25 MG tablet; Take 1 tablet (25 mg total) by mouth daily. Take along with 50 mg to equal 75  Dispense: 90 tablet; Refill: 1  5. Enterococcus UTI Continue on amoxicillin until 05/10/2019 per ED notes. Encouraged hydration with proper pericare.  6. Hyperlipidemia associated with type 2 diabetes mellitus ([place]) -refill requested - atorvastatin (LIPITOR) 20 MG tablet; Take 1 tablet (20 mg total) by mouth daily at 6 PM.  Dispense: 90 tablet; Refill: 1  7. DM a1c elevated on recent test, she is taking metformin 500 mg twice daily with additional tablet if blood sugars elevated in the morning. [name] reports blood sugars generally are below 150. She will continue to monitor and notify if blood sugars stay elevated.   Next appt: 3 months Amaru Burroughs K. Zyheir Daft, [facility] [phone]    Virtual Visit via Telephone Note  I connected with pt and daughter in law on 05/06/19 at  3:45 PM EST by telephone and verified that I am speaking with the correct person using two identifiers.  Location: Patient: home Provider: office    I discussed the limitations, risks, security and privacy concerns of performing an evaluation and management service by telephone and the  availability of in person appointments. I also discussed with the patient that there may be a patient responsible charge related to this service. The patient expressed understanding and agreed to proceed.   I discussed the assessment and treatment plan with the patient. The patient was provided an opportunity to ask questions and all were answered. The patient agreed with the plan and demonstrated an understanding of the instructions.   The patient was advised to  call back or seek an in-person evaluation if the symptoms worsen or if the condition fails to improve as anticipated.  I provided 21 minutes of non-face-to-face time during this encounter.  Carlos American. Harle Battiest Avs printed and mailed

## 2019-05-08 DIAGNOSIS — E1142 Type 2 diabetes mellitus with diabetic polyneuropathy: Secondary | ICD-10-CM | POA: Diagnosis not present

## 2019-05-08 DIAGNOSIS — I10 Essential (primary) hypertension: Secondary | ICD-10-CM | POA: Diagnosis not present

## 2019-05-08 DIAGNOSIS — L89153 Pressure ulcer of sacral region, stage 3: Secondary | ICD-10-CM | POA: Diagnosis not present

## 2019-05-08 DIAGNOSIS — Z7984 Long term (current) use of oral hypoglycemic drugs: Secondary | ICD-10-CM | POA: Diagnosis not present

## 2019-05-08 DIAGNOSIS — I890 Lymphedema, not elsewhere classified: Secondary | ICD-10-CM | POA: Diagnosis not present

## 2019-05-08 DIAGNOSIS — F0390 Unspecified dementia without behavioral disturbance: Secondary | ICD-10-CM | POA: Diagnosis not present

## 2019-05-11 DIAGNOSIS — R55 Syncope and collapse: Secondary | ICD-10-CM | POA: Insufficient documentation

## 2019-05-20 DIAGNOSIS — I890 Lymphedema, not elsewhere classified: Secondary | ICD-10-CM | POA: Diagnosis not present

## 2019-05-20 DIAGNOSIS — L89153 Pressure ulcer of sacral region, stage 3: Secondary | ICD-10-CM | POA: Diagnosis not present

## 2019-05-20 DIAGNOSIS — E1142 Type 2 diabetes mellitus with diabetic polyneuropathy: Secondary | ICD-10-CM | POA: Diagnosis not present

## 2019-05-20 DIAGNOSIS — I10 Essential (primary) hypertension: Secondary | ICD-10-CM | POA: Diagnosis not present

## 2019-05-20 DIAGNOSIS — Z7984 Long term (current) use of oral hypoglycemic drugs: Secondary | ICD-10-CM | POA: Diagnosis not present

## 2019-05-20 DIAGNOSIS — F0390 Unspecified dementia without behavioral disturbance: Secondary | ICD-10-CM | POA: Diagnosis not present

## 2019-05-25 DIAGNOSIS — Z7901 Long term (current) use of anticoagulants: Secondary | ICD-10-CM | POA: Diagnosis not present

## 2019-05-25 DIAGNOSIS — E1142 Type 2 diabetes mellitus with diabetic polyneuropathy: Secondary | ICD-10-CM | POA: Diagnosis not present

## 2019-05-25 DIAGNOSIS — Z8673 Personal history of transient ischemic attack (TIA), and cerebral infarction without residual deficits: Secondary | ICD-10-CM | POA: Diagnosis not present

## 2019-05-25 DIAGNOSIS — I10 Essential (primary) hypertension: Secondary | ICD-10-CM | POA: Diagnosis not present

## 2019-05-25 DIAGNOSIS — Z86718 Personal history of other venous thrombosis and embolism: Secondary | ICD-10-CM | POA: Diagnosis not present

## 2019-05-25 DIAGNOSIS — R1312 Dysphagia, oropharyngeal phase: Secondary | ICD-10-CM | POA: Diagnosis not present

## 2019-05-25 DIAGNOSIS — Z7984 Long term (current) use of oral hypoglycemic drugs: Secondary | ICD-10-CM | POA: Diagnosis not present

## 2019-05-25 DIAGNOSIS — F0390 Unspecified dementia without behavioral disturbance: Secondary | ICD-10-CM | POA: Diagnosis not present

## 2019-05-25 DIAGNOSIS — I890 Lymphedema, not elsewhere classified: Secondary | ICD-10-CM | POA: Diagnosis not present

## 2019-05-25 DIAGNOSIS — L89153 Pressure ulcer of sacral region, stage 3: Secondary | ICD-10-CM | POA: Diagnosis not present

## 2019-05-25 DIAGNOSIS — Z86711 Personal history of pulmonary embolism: Secondary | ICD-10-CM | POA: Diagnosis not present

## 2019-05-30 ENCOUNTER — Other Ambulatory Visit: Payer: Medicare Other | Admitting: *Deleted

## 2019-05-30 ENCOUNTER — Telehealth: Payer: Self-pay | Admitting: Licensed Clinical Social Worker

## 2019-05-30 ENCOUNTER — Other Ambulatory Visit: Payer: Self-pay

## 2019-05-30 DIAGNOSIS — Z515 Encounter for palliative care: Secondary | ICD-10-CM

## 2019-05-30 NOTE — Telephone Encounter (Signed)
Palliative Care SW left a message with patient's daughter-in-law, [name].  A conference call was scheduled at 1pm today with the Palliative Care RN, Daryl Eastern.

## 2019-05-31 DIAGNOSIS — I890 Lymphedema, not elsewhere classified: Secondary | ICD-10-CM | POA: Diagnosis not present

## 2019-05-31 DIAGNOSIS — E1142 Type 2 diabetes mellitus with diabetic polyneuropathy: Secondary | ICD-10-CM | POA: Diagnosis not present

## 2019-05-31 DIAGNOSIS — F0390 Unspecified dementia without behavioral disturbance: Secondary | ICD-10-CM | POA: Diagnosis not present

## 2019-05-31 DIAGNOSIS — L89153 Pressure ulcer of sacral region, stage 3: Secondary | ICD-10-CM | POA: Diagnosis not present

## 2019-05-31 DIAGNOSIS — Z7984 Long term (current) use of oral hypoglycemic drugs: Secondary | ICD-10-CM | POA: Diagnosis not present

## 2019-05-31 DIAGNOSIS — I10 Essential (primary) hypertension: Secondary | ICD-10-CM | POA: Diagnosis not present

## 2019-06-03 NOTE — Progress Notes (Signed)
COMMUNITY PALLIATIVE CARE RN NOTE  PATIENT NAME: [name] DOB: 24-Aug-1935 MRN: [id number]  PRIMARY CARE PROVIDER: [name], NP  RESPONSIBLE PARTY:  Acct ID - Guarantor Home Phone Work Phone Relationship Acct Type  [id number] AMISSA, [phone]  Self P/F     [address], [place], [place] [address]   Due to the COVID-19 crisis, this virtual check-in visit was done via telephone from my office and it was initiated and consent by this patient and or family.  PLAN OF CARE and INTERVENTION:  1. ADVANCE CARE PLANNING/GOALS OF CARE: Goal is for patient to remain at home with her family. She has a DNR. 2. PATIENT/CAREGIVER EDUCATION: Symptom Management 3. DISEASE STATUS: Virtual check-in visit completed via telephone. Most of patient's recent history is provided by her daughter-in-law [name]. Patient had a visit to the ED last month on 04/26/19 d/t not eating well, lethargy and oral thrush. She was found to have a UTI and was treated with IV antibiotics while in the hospital, and was released on oral antibiotics. Patient was also diagnosed with Covid-19, along with the rest of her family. A chest x-ray was also obtained while she was there and was negative. Since completion of the antibiotics, patient has "perked up." Her thrush has cleared after the use of Magic Mouthwash. She is eating better. She was also sent to the ED on 05/04/19 after a syncopal episode. Her BP was very elevated per [name], and they feel that she may have had a dysrhythmia. She was admitted for observation then released back to her home. She also had a syncope episode yesterday on the bedside commode after having a large BM. Within 5 minutes she had improved and was looking around and fully awake. They decided to keep patient at home and monitor her. She is doing fine so far today. Her blood sugars have been stable. She continues on Metformin. Her BP has been more stable. She continues with Losartan and  Clonidine. She continues with wounds to her bottom being treated by a home health nurse through [facility]. Her left leg wound has completely healed and she is no longer going to the Wound center. Will continue to monitor.   HISTORY OF PRESENT ILLNESS:  This is a 84 yo female who resides at home with her son and daughter in law. Palliative care continues to follow patient. Team to continue to visit patient monthly and PRN. Next visit scheduled for 06/27/19 at 11:00 am.  CODE STATUS: DNR ADVANCED DIRECTIVES: Y MOST FORM: no PPS: 30%   (Duration of visit and documentation 60 minutes)   Daryl Eastern, RN BSN

## 2019-06-05 DIAGNOSIS — Z7984 Long term (current) use of oral hypoglycemic drugs: Secondary | ICD-10-CM | POA: Diagnosis not present

## 2019-06-05 DIAGNOSIS — F0390 Unspecified dementia without behavioral disturbance: Secondary | ICD-10-CM | POA: Diagnosis not present

## 2019-06-05 DIAGNOSIS — L89153 Pressure ulcer of sacral region, stage 3: Secondary | ICD-10-CM | POA: Diagnosis not present

## 2019-06-05 DIAGNOSIS — I890 Lymphedema, not elsewhere classified: Secondary | ICD-10-CM | POA: Diagnosis not present

## 2019-06-05 DIAGNOSIS — I10 Essential (primary) hypertension: Secondary | ICD-10-CM | POA: Diagnosis not present

## 2019-06-05 DIAGNOSIS — E1142 Type 2 diabetes mellitus with diabetic polyneuropathy: Secondary | ICD-10-CM | POA: Diagnosis not present

## 2019-06-13 ENCOUNTER — Encounter: Payer: Self-pay | Admitting: Nurse Practitioner

## 2019-06-13 ENCOUNTER — Ambulatory Visit (INDEPENDENT_AMBULATORY_CARE_PROVIDER_SITE_OTHER): Payer: Medicare Other | Admitting: Nurse Practitioner

## 2019-06-13 ENCOUNTER — Other Ambulatory Visit: Payer: Self-pay

## 2019-06-13 VITALS — BP 158/84 | HR 63 | Temp 98.4°F | Ht 67.0 in

## 2019-06-13 DIAGNOSIS — N898 Other specified noninflammatory disorders of vagina: Secondary | ICD-10-CM | POA: Diagnosis not present

## 2019-06-13 DIAGNOSIS — L723 Sebaceous cyst: Secondary | ICD-10-CM

## 2019-06-13 NOTE — Progress Notes (Signed)
This service is provided via telemedicine  Vital signs werecollected/recorded during the telemedicine encounter by Daughter In Cathie Beams and Documented by [email or web address].   Location of patient (ex: home, work):  Home  Patient consents to a telephone visit:  Yes  Location of the provider (ex: office, home):  [place].   Name of any referring provider:  N/A  Names of all persons participating in the telemedicine service and their role in the encounter:  Patient, Daughter in [place], [place], [place], [name], NP.    Time spent on call:  8 minutes spent on the phone with Medical Assistant.       Careteam: Patient Care Team: [name], NP as PCP - General (Geriatric Medicine) [name], MD as Consulting Physician (Oncology) [name], [name], LCSW as Social Worker (Licensed Clinical Social Worker) [name], RN as Registered Nurse [facility] and [place])  Advanced Directive information Does Patient Have a Medical Advance Directive?: Yes, Type of Advance Directive: Healthcare Power of [place];Living will, Does patient want to make changes to medical advance directive?: No - Guardian declined  No Known Allergies  Chief Complaint  Patient presents with  . Acute Visit    Boil on Buttocks x 1.5 weeks.      HPI: Patient is a 84 y.o. female via video visit, mychart. Reports she has a area on her buttock that she was concerned over infection. Today this has actually improved from last night. Has yellow/gray/bloody drainage. Cyst softer now. Previously using warm compresses but not routinely. No redness or warmth, no fever noted.   Feels like she may be more fatigue, but unsure if that is from her blood pressure- slightly lower today than baseline  White discharge noted to vaginal area this morning- had been using vagisil cleaning wash in the past but not using now.   Review of Systems:  Review of  Systems  Unable to perform ROS: Dementia    Past Medical History:  Diagnosis Date  . Dementia ([place])   . Diabetes mellitus type II 03/21/2011  . DVT (deep venous thrombosis) ([place]) 03/21/2011  . Hyperlipidemia 03/21/2011  . Hypertension 03/21/2011  . Lymphedema of leg 03/21/2011  . Pulmonary embolism ([place]) 03/21/2011  . Stroke [facility])    Past Surgical History:  Procedure Laterality Date  . CHOLECYSTECTOMY  2015  . SKIN TAG REMOVAL  07/25/2016  . VAGINAL HYSTERECTOMY     unknown of date  . VASCULAR SURGERY     Social History:   reports that she has never smoked. She has never used smokeless tobacco. She reports that she does not drink alcohol or use drugs.  Family History  Problem Relation Age of Onset  . Heart attack Mother   . COPD Sister   . Stroke Sister   . Bipolar disorder Daughter     Medications: Patient's Medications  New Prescriptions   No medications on file  Previous Medications   A&D OINT    Apply 1 application topically as needed.    ACCU-CHEK SOFTCLIX LANCETS LANCETS    1 each by Other route 2 (two) times daily. Use as instructed Dx: E11.9   ACETAMINOPHEN (TYLENOL) 500 MG TABLET    Take 500 mg by mouth every 8 (eight) hours as needed (for general pain).    ATORVASTATIN (LIPITOR) 20 MG TABLET    Take 1 tablet (20 mg total) by mouth daily at 6 PM.   CHOLECALCIFEROL (VITAMIN D-3) 125 MCG (  5000 UT) TABS    Take 5,000 Units by mouth daily.   CLONIDINE (CATAPRES) 0.1 MG TABLET    Take 1 tablet (0.1 mg total) by mouth 2 (two) times daily. May take an additional tablet if needed SBP >170   DICLOFENAC SODIUM (VOLTAREN) 1 % GEL    Apply 1 application topically 2 (two) times daily as needed (pain).    GABAPENTIN (NEURONTIN) 300 MG CAPSULE    Take 1 capsule (300 mg total) by mouth 2 (two) times daily.   GLUCOSE BLOOD (ACCU-CHEK AVIVA PLUS) TEST STRIP    1 each by Other route 2 (two) times daily. E11.9   LOSARTAN (COZAAR) 25 MG TABLET    Take 1 tablet (25 mg total) by  mouth daily. Take along with 50 mg to equal 75   LOSARTAN (COZAAR) 50 MG TABLET    Take 1 tablet (50 mg total) by mouth daily. Take 1 along with 25 mg to equal 75 mg total   METFORMIN (GLUCOPHAGE) 500 MG TABLET    Take 500 mg by mouth 2 (two) times daily with a meal. If blood sugar greater than 160 in the morning patient will take 2 tablets (1000mg ) in the am   NON FORMULARY    Take 1 capsule by mouth See admin instructions. Metagenics - UltraFlora Balance Daily Probiotic Immune Support* capsules- Take 1 capsule by mouth once a day   POLYETHYLENE GLYCOL (MIRALAX / GLYCOLAX) PACKET    Take 17 g by mouth daily as needed for mild constipation (MIX AND DRINK).    RIVAROXABAN (XARELTO) 15 MG TABS TABLET    Take 1 tablet (15 mg total) by mouth daily with supper.   WOUND DRESSINGS (MEPILEX EX)    Apply 1 patch topically See admin instructions. Apply to sacrum and change one to two times daily  Modified Medications   No medications on file  Discontinued Medications   MENTHOL-ZINC OXIDE (MOISTURE BARRIER EX)    Apply 1 application topically See admin instructions. Apply to sacral wound one to two times daily   NYSTATIN (MYCOSTATIN) 100000 UNIT/ML SUSPENSION    Take 5 mLs (500,000 Units total) by mouth 4 (four) times daily.   VITAMINS A & D (VITAMIN A & D) OINTMENT    Apply 1 application topically as needed for dry skin (to feet or other affected areas).     Physical Exam:  Vitals:   06/13/19 1404  BP: (!) 158/84  Pulse: 63  Temp: 98.4 F (36.9 C)  SpO2: 98%  Height: 5\' 7"  (1.702 m)   Body mass index is 20.37 kg/m. Wt Readings from Last 3 Encounters:  05/04/19 130 lb 1.1 oz (59 kg)  11/02/18 130 lb (59 kg)  01/30/18 130 lb (59 kg)      Labs reviewed: Basic Metabolic Panel: Recent Labs    04/26/19 1843 05/04/19 0035 05/04/19 0704 05/05/19 0623  NA 135 134*  --  138  K 4.1 4.5  --  4.8  CL 96* 97*  --  101  CO2 27 25  --  26  GLUCOSE 130* 311*  --  132*  BUN 20 17  --  10   CREATININE 0.40* 0.57  --  0.37*  CALCIUM 9.4 9.2  --  9.5  TSH  --   --  2.756  --    Liver Function Tests: Recent Labs    01/11/19 2004 01/27/19 1636 04/26/19 1843  AST 16 17 18   ALT 14 12 14   ALKPHOS 76  65 66  BILITOT 0.6 0.1* 0.5  PROT 7.5 6.0* 6.6  ALBUMIN 3.4* 3.0* 3.6   No results for input(s): LIPASE, AMYLASE in the last 8760 hours. No results for input(s): AMMONIA in the last 8760 hours. CBC: Recent Labs    02/25/19 1518 02/25/19 1518 04/26/19 1843 05/04/19 0035 05/05/19 0929  WBC 4.1   < > 4.0 2.5* 2.8*  NEUTROABS 2,341  --  2.1 1.2*  --   HGB 10.8*   < > 11.6* 11.5* 13.2  HCT 33.6*   < > 38.1 36.4 42.2  MCV 91.3   < > 94.3 92.4 92.5  PLT 207   < > 174 173 204   < > = values in this interval not displayed.   Lipid Panel: Recent Labs    09/03/18 1143  CHOL 173  HDL 52  LDLCALC 103*  TRIG 90  CHOLHDL 3.3   TSH: Recent Labs    05/04/19 0704  TSH 2.756   A1C: Lab Results  Component Value Date   HGBA1C 7.9 (H) 05/04/2019     Assessment/Plan 1. Sebaceous cyst -was able to visualize cyst via mychart virtually, no redness noted. Drainage easily removed from massage of area. Recommended warm compress 2- 3 times daily routinely with massage to help remove discharge.  To notify for increase redness or warmth. To monitor for fever or changes in LOC as well  2. Vaginal discharge White discharge noted this morning but has improved since bath and not returned. They plan on monitoring and notify if worsens. Advised against "special" vaginal washes and just to use mild soap with water.   Next appt: 08/05/2019 as scheduled, sooner if needed Merleen Picazo K. Harle Battiest  [facility] & Adult Medicine [phone]   Virtual Visit via Video Note  I connected with Jearldean Hodes on 06/13/19 at  2:00 PM EST by a video enabled telemedicine application and verified that I am speaking with the correct person using two identifiers.  Location: Patient:  home Provider: [facility]   I discussed the limitations of evaluation and management by telemedicine and the availability of in person appointments. The patient expressed understanding and agreed to proceed.    I discussed the assessment and treatment plan with the patient. The patient was provided an opportunity to ask questions and all were answered. The patient agreed with the plan and demonstrated an understanding of the instructions.   The patient was advised to call back or seek an in-person evaluation if the symptoms worsen or if the condition fails to improve as anticipated.  I provided 15 minutes of non-face-to-face time during this encounter.  Carlos American. Dewaine Oats, AGNP Avs printed and mailed.

## 2019-06-14 DIAGNOSIS — Z7984 Long term (current) use of oral hypoglycemic drugs: Secondary | ICD-10-CM | POA: Diagnosis not present

## 2019-06-14 DIAGNOSIS — F0390 Unspecified dementia without behavioral disturbance: Secondary | ICD-10-CM | POA: Diagnosis not present

## 2019-06-14 DIAGNOSIS — L89153 Pressure ulcer of sacral region, stage 3: Secondary | ICD-10-CM | POA: Diagnosis not present

## 2019-06-14 DIAGNOSIS — I10 Essential (primary) hypertension: Secondary | ICD-10-CM | POA: Diagnosis not present

## 2019-06-14 DIAGNOSIS — E1142 Type 2 diabetes mellitus with diabetic polyneuropathy: Secondary | ICD-10-CM | POA: Diagnosis not present

## 2019-06-14 DIAGNOSIS — I890 Lymphedema, not elsewhere classified: Secondary | ICD-10-CM | POA: Diagnosis not present

## 2019-06-19 DIAGNOSIS — I10 Essential (primary) hypertension: Secondary | ICD-10-CM | POA: Diagnosis not present

## 2019-06-19 DIAGNOSIS — L89153 Pressure ulcer of sacral region, stage 3: Secondary | ICD-10-CM | POA: Diagnosis not present

## 2019-06-19 DIAGNOSIS — Z7984 Long term (current) use of oral hypoglycemic drugs: Secondary | ICD-10-CM | POA: Diagnosis not present

## 2019-06-19 DIAGNOSIS — I890 Lymphedema, not elsewhere classified: Secondary | ICD-10-CM | POA: Diagnosis not present

## 2019-06-19 DIAGNOSIS — F0390 Unspecified dementia without behavioral disturbance: Secondary | ICD-10-CM | POA: Diagnosis not present

## 2019-06-19 DIAGNOSIS — E1142 Type 2 diabetes mellitus with diabetic polyneuropathy: Secondary | ICD-10-CM | POA: Diagnosis not present

## 2019-06-24 DIAGNOSIS — Z8673 Personal history of transient ischemic attack (TIA), and cerebral infarction without residual deficits: Secondary | ICD-10-CM | POA: Diagnosis not present

## 2019-06-24 DIAGNOSIS — R1312 Dysphagia, oropharyngeal phase: Secondary | ICD-10-CM | POA: Diagnosis not present

## 2019-06-24 DIAGNOSIS — I10 Essential (primary) hypertension: Secondary | ICD-10-CM | POA: Diagnosis not present

## 2019-06-24 DIAGNOSIS — F0390 Unspecified dementia without behavioral disturbance: Secondary | ICD-10-CM | POA: Diagnosis not present

## 2019-06-24 DIAGNOSIS — Z86718 Personal history of other venous thrombosis and embolism: Secondary | ICD-10-CM

## 2019-06-24 DIAGNOSIS — I890 Lymphedema, not elsewhere classified: Secondary | ICD-10-CM | POA: Diagnosis not present

## 2019-06-24 DIAGNOSIS — Z7901 Long term (current) use of anticoagulants: Secondary | ICD-10-CM | POA: Diagnosis not present

## 2019-06-24 DIAGNOSIS — L89153 Pressure ulcer of sacral region, stage 3: Secondary | ICD-10-CM | POA: Diagnosis not present

## 2019-06-24 DIAGNOSIS — Z86711 Personal history of pulmonary embolism: Secondary | ICD-10-CM

## 2019-06-24 DIAGNOSIS — E1142 Type 2 diabetes mellitus with diabetic polyneuropathy: Secondary | ICD-10-CM | POA: Diagnosis not present

## 2019-06-24 DIAGNOSIS — Z7984 Long term (current) use of oral hypoglycemic drugs: Secondary | ICD-10-CM | POA: Diagnosis not present

## 2019-06-27 ENCOUNTER — Other Ambulatory Visit: Payer: Medicare Other | Admitting: *Deleted

## 2019-06-27 ENCOUNTER — Other Ambulatory Visit: Payer: Self-pay

## 2019-06-27 DIAGNOSIS — Z515 Encounter for palliative care: Secondary | ICD-10-CM

## 2019-06-27 NOTE — Progress Notes (Signed)
COMMUNITY PALLIATIVE CARE RN NOTE  PATIENT NAME: [name] DOB: 1935-11-20 MRN: [id number]  PRIMARY CARE PROVIDER: [name], NP  RESPONSIBLE PARTY:  Acct ID - Guarantor Home Phone Work Phone Relationship Acct Type  [id number] TUONGVI, [phone]  Self P/F     [address], [place], [place] [address]   Due to the COVID-19 crisis, this virtual check-in visit was done via telephone from my office and it was initiated and consent by this patient and or family.  PLAN OF CARE and INTERVENTION:  1. ADVANCE CARE PLANNING/GOALS OF CARE: Goal is for patient to remain at home with her son and daughter in law. She has a DNR. 2. PATIENT/CAREGIVER EDUCATION: Symptom management 3. DISEASE STATUS: Virtual check-in visit completed via telephone. Patient is currently sitting up in her wheelchair at the dining table being fed breakfast. No pain reported at this time. No breathing issues. No coughing or congestion noted over the past month. Patient remains total care with all ADLs. She is transferred with 2 person assistance. They also have a Hoyer lift if needed. Her intake is good. She is incontinent of both bowel and bladder and wears briefs. She says that her blood pressures and blood sugars have been well controlled on current medications. No changes have been made recently to her medication regimen. No syncope episodes this month. She did have a boil noted to her bottom with some drainage about 2 weeks ago. This area has now cleared. She does have some breakdown on her sacrum, but is being treated. She continues with a home health RN with [facility] for wound care. Will continue to monitor.  HISTORY OF PRESENT ILLNESS: This is a 84 yo female with a diagnosis of Dementia. Palliative care continues to follow patient. Will continue to visit patient monthly and PRN.   CODE STATUS: DNR ADVANCED DIRECTIVES: Y MOST FORM: no PPS: 30%   (Duration of visit and documentation 45  minutes)   Daryl Eastern, RN BSN

## 2019-07-04 DIAGNOSIS — Z7984 Long term (current) use of oral hypoglycemic drugs: Secondary | ICD-10-CM | POA: Diagnosis not present

## 2019-07-04 DIAGNOSIS — I890 Lymphedema, not elsewhere classified: Secondary | ICD-10-CM | POA: Diagnosis not present

## 2019-07-04 DIAGNOSIS — I10 Essential (primary) hypertension: Secondary | ICD-10-CM | POA: Diagnosis not present

## 2019-07-04 DIAGNOSIS — F0390 Unspecified dementia without behavioral disturbance: Secondary | ICD-10-CM | POA: Diagnosis not present

## 2019-07-04 DIAGNOSIS — E1142 Type 2 diabetes mellitus with diabetic polyneuropathy: Secondary | ICD-10-CM | POA: Diagnosis not present

## 2019-07-04 DIAGNOSIS — L89153 Pressure ulcer of sacral region, stage 3: Secondary | ICD-10-CM | POA: Diagnosis not present

## 2019-07-08 ENCOUNTER — Other Ambulatory Visit: Payer: Self-pay | Admitting: Nurse Practitioner

## 2019-07-08 DIAGNOSIS — Z86718 Personal history of other venous thrombosis and embolism: Secondary | ICD-10-CM

## 2019-07-12 DIAGNOSIS — L89153 Pressure ulcer of sacral region, stage 3: Secondary | ICD-10-CM | POA: Diagnosis not present

## 2019-07-12 DIAGNOSIS — Z7984 Long term (current) use of oral hypoglycemic drugs: Secondary | ICD-10-CM | POA: Diagnosis not present

## 2019-07-12 DIAGNOSIS — I10 Essential (primary) hypertension: Secondary | ICD-10-CM | POA: Diagnosis not present

## 2019-07-12 DIAGNOSIS — I890 Lymphedema, not elsewhere classified: Secondary | ICD-10-CM | POA: Diagnosis not present

## 2019-07-12 DIAGNOSIS — F0390 Unspecified dementia without behavioral disturbance: Secondary | ICD-10-CM | POA: Diagnosis not present

## 2019-07-12 DIAGNOSIS — E1142 Type 2 diabetes mellitus with diabetic polyneuropathy: Secondary | ICD-10-CM | POA: Diagnosis not present

## 2019-08-01 ENCOUNTER — Other Ambulatory Visit: Payer: Medicare Other | Admitting: *Deleted

## 2019-08-01 ENCOUNTER — Other Ambulatory Visit: Payer: Self-pay

## 2019-08-01 DIAGNOSIS — Z515 Encounter for palliative care: Secondary | ICD-10-CM

## 2019-08-05 ENCOUNTER — Other Ambulatory Visit: Payer: Self-pay

## 2019-08-05 ENCOUNTER — Telehealth (INDEPENDENT_AMBULATORY_CARE_PROVIDER_SITE_OTHER): Payer: Medicare Other | Admitting: Nurse Practitioner

## 2019-08-05 ENCOUNTER — Encounter: Payer: Self-pay | Admitting: Nurse Practitioner

## 2019-08-05 DIAGNOSIS — F0390 Unspecified dementia without behavioral disturbance: Secondary | ICD-10-CM | POA: Diagnosis not present

## 2019-08-05 DIAGNOSIS — Z7189 Other specified counseling: Secondary | ICD-10-CM | POA: Diagnosis not present

## 2019-08-05 DIAGNOSIS — E785 Hyperlipidemia, unspecified: Secondary | ICD-10-CM

## 2019-08-05 DIAGNOSIS — L8990 Pressure ulcer of unspecified site, unspecified stage: Secondary | ICD-10-CM | POA: Diagnosis not present

## 2019-08-05 DIAGNOSIS — Z8616 Personal history of COVID-19: Secondary | ICD-10-CM | POA: Diagnosis not present

## 2019-08-05 DIAGNOSIS — E1142 Type 2 diabetes mellitus with diabetic polyneuropathy: Secondary | ICD-10-CM | POA: Diagnosis not present

## 2019-08-05 DIAGNOSIS — I10 Essential (primary) hypertension: Secondary | ICD-10-CM

## 2019-08-05 DIAGNOSIS — L723 Sebaceous cyst: Secondary | ICD-10-CM | POA: Diagnosis not present

## 2019-08-05 DIAGNOSIS — E1169 Type 2 diabetes mellitus with other specified complication: Secondary | ICD-10-CM

## 2019-08-05 MED ORDER — GABAPENTIN 300 MG PO CAPS: 300.0000 mg | ORAL_CAPSULE | Freq: Two times a day (BID) | ORAL | 1 refills | Status: DC

## 2019-08-05 MED ORDER — CLONIDINE HCL 0.1 MG PO TABS: 0.1000 mg | ORAL_TABLET | Freq: Two times a day (BID) | ORAL | 5 refills | Status: DC

## 2019-08-05 MED ORDER — ATORVASTATIN CALCIUM 20 MG PO TABS: 20.0000 mg | ORAL_TABLET | Freq: Every day | ORAL | 1 refills | Status: DC

## 2019-08-05 NOTE — Progress Notes (Signed)
COMMUNITY PALLIATIVE CARE RN NOTE  PATIENT NAME: [name] DOB: 1935-07-02 MRN: [id number]  PRIMARY CARE PROVIDER: [name], NP  RESPONSIBLE PARTY: [name] (daughter-in-law) Acct ID - Guarantor Home Phone Work Phone Relationship Acct Type  [id number] KENDIS, [phone]  Self P/F     [address], [place], [place] [address]   Due to the COVID-19 crisis, this virtual check-in visit was done via telephone from my office and it was initiated and consent by this patient and or family.  PLAN OF CARE and INTERVENTION:  1. ADVANCE CARE PLANNING/GOALS OF CARE: Goal is for patient to remain at home and avoid hospitalization. She has a DNR. 2. PATIENT/CAREGIVER EDUCATION: Symptom management, safe mobility/transfers, treatment of skin breakdown 3. DISEASE STATUS: Virtual check-in visit completed via telephone. Overall, [name] says that patient is doing "pretty good." No recent complaints of pain. No reports of dyspnea. [name] does say that earlier this week that patient had 2 days of vomiting and diarrhea. She feels that it may have been d/t one of her hired caregivers being sick recently when she came to work. She has been having some issues with increased bowel incontinence because of this. This morning, only a small smear of stool was found in her briefs and no vomiting for the past 2 days so feels this "stomach bug" is coming to an end. She has remained afebrile. Her appetite, both eating and drinking, has picked back up and she says that it appears that patient is gaining weight. Her blood sugar this am is 188. No recent issues with dysphagia recently. She continues to take her medications crushed in applesauce. She continues with some elevated blood pressures, but she regulates it with Losartan and Clonidine, which she feels remains effective. She does have some skin breakdown on her sacrum. Areas alternate between opening up but then closes again. They utilize foam dressings for  protection when area is open, and barrier creams once it has closed back up. Currently it is covered with a dressing. Patient was discontinued from [facility] for RN visits as of 07/12/19. They continue to transfer patient with 2 person assistance to the bedside commode and to the wheelchair. She is total care for ADLs. Continues with lower extremity edema, but legs are kept elevated much of the day. Will continue to monitor.  HISTORY OF PRESENT ILLNESS: This is a 84 yo female who resides in the home with her son and daughter-in-law. Palliative care team continues to follow patient and visits monthly and PRN.   CODE STATUS: DNR ADVANCED DIRECTIVES: Y MOST FORM: no PPS: 30%   (Duration of visit and documentation 30 minutes)   Daryl Eastern, RN BSN

## 2019-08-05 NOTE — Progress Notes (Signed)
This service is provided via telemedicine  No vital signs collected/recorded due to the encounter was a telemedicine visit.   Location of patient (ex: home, work):  Home   Patient consents to a telephone visit:  Yes  Location of the provider (ex: office, home):  [employer], Office   Name of any referring provider:  [name], NP  Names of all persons participating in the telemedicine service and their role in the encounter:  S.Chrae B/CMA, [name], NP, [name] (relative) and Patient   Time spent on call:  9 min with medical assistant      Careteam: Patient Care Team: [name], NP as PCP - General (Geriatric Medicine) [name], MD as Consulting Physician (Oncology) [name], [name], LCSW as Social Worker (Licensed Clinical Social Worker) [name], RN as Registered Nurse (Hospice and Palliative Medicine)  PLACE OF SERVICE:  [place] Directive information Does Patient Have a Medical Advance Directive?: Yes, Type of Advance Directive: Healthcare Power of Attorney, Does patient want to make changes to medical advance directive?: No - Guardian declined  No Known Allergies  Chief Complaint  Patient presents with  . Medical Management of Chronic Issues    3 month follow-up. Somach bug last week (had diarrhea) that has now resolved (FYI).Telehealth/[place] video visit   . Quality Metric Gaps    Discuss need for foot and eye exam   . Immunizations    Discuss TD/Tdap recommendation. Discuss covid-19 vaccine.   . Medication Management    Ref[facility] medications to [facility]      HPI: Patient is a 84 y.o. female for routine follow up. Has not had an in-office visit in at least 6 months.  She was following with wound care but this has been resolved for in fall 2020.   She was in the ED in December for COVID and UTI. Has not had COVID vaccine.   Had GI bug last week, but better now.  DM- elevated last week due but had  cut back on metformin when she had diarrhea. Blood sugars 115-120s prior and has improved at this time.  With neuropathy controlled gabapentin 300 mg BID.  Due for eye exam and foot exam. Last diabetic eye exam was in 2018 or 2019, did not go last year due to [place].   htn- 165/93 today before medication, she has not taken medication yet. Generally do not take medication after medication 138/76, generally below 150/90  Cyst- resolved at this time.   Pressure ulcer on sacrum is pretty much healed.   A part of a palliatives program, DNR signed and mailed to the house but daughter-in-law does not feel like they agreed on that.   Review of Systems:  Review of Systems  Unable to perform ROS: Dementia    Past Medical History:  Diagnosis Date  . Dementia ([place])   . Diabetes mellitus type II 03/21/2011  . DVT (deep venous thrombosis) ([place]) 03/21/2011  . Hyperlipidemia 03/21/2011  . Hypertension 03/21/2011  . Lymphedema of leg 03/21/2011  . Pulmonary embolism ([place]) 03/21/2011  . Stroke [facility])    Past Surgical History:  Procedure Laterality Date  . CHOLECYSTECTOMY  2015  . SKIN TAG REMOVAL  07/25/2016  . VAGINAL HYSTERECTOMY     unknown of date  . VASCULAR SURGERY     Social History:   reports that she has never smoked. She has never used smokeless tobacco. She reports that she does not drink  alcohol or use drugs.  Family History  Problem Relation Age of Onset  . Heart attack Mother   . COPD Sister   . Stroke Sister   . Bipolar disorder Daughter     Medications: Patient's Medications  New Prescriptions   No medications on file  Previous Medications   A&D OINT    Apply 1 application topically as needed.    ACCU-CHEK SOFTCLIX LANCETS LANCETS    1 each by Other route 2 (two) times daily. Use as instructed Dx: E11.9   ACETAMINOPHEN (TYLENOL) 500 MG TABLET    Take 500 mg by mouth every 8 (eight) hours as needed (for general pain).    ATORVASTATIN (LIPITOR) 20 MG TABLET    Take 1  tablet (20 mg total) by mouth daily at 6 PM.   CHOLECALCIFEROL (VITAMIN D-3) 125 MCG (5000 UT) TABS    Take 5,000 Units by mouth daily.   CLONIDINE (CATAPRES) 0.1 MG TABLET    Take 1 tablet (0.1 mg total) by mouth 2 (two) times daily. May take an additional tablet if needed SBP >170   DICLOFENAC SODIUM (VOLTAREN) 1 % GEL    Apply 1 application topically 2 (two) times daily as needed (pain).    GABAPENTIN (NEURONTIN) 300 MG CAPSULE    Take 1 capsule (300 mg total) by mouth 2 (two) times daily.   GLUCOSE BLOOD (ACCU-CHEK AVIVA PLUS) TEST STRIP    1 each by Other route 2 (two) times daily. E11.9   LOSARTAN (COZAAR) 25 MG TABLET    Take 1 tablet (25 mg total) by mouth daily. Take along with 50 mg to equal 75   LOSARTAN (COZAAR) 50 MG TABLET    Take 1 tablet (50 mg total) by mouth daily. Take 1 along with 25 mg to equal 75 mg total   METFORMIN (GLUCOPHAGE) 500 MG TABLET    Take 500 mg by mouth 2 (two) times daily with a meal. If blood sugar greater than 160 in the morning patient will take 2 tablets (1000mg ) in the am   NON FORMULARY    Take 1 capsule by mouth See admin instructions. Metagenics - UltraFlora Balance Daily Probiotic Immune Support* capsules- Take 1 capsule by mouth once a day   POLYETHYLENE GLYCOL (MIRALAX / GLYCOLAX) PACKET    Take 17 g by mouth daily as needed for mild constipation (MIX AND DRINK).    WOUND DRESSINGS (MEPILEX EX)    Apply 1 patch topically See admin instructions. Apply to sacrum and change one to two times daily   XARELTO 15 MG TABS TABLET    TAKE ONE TABLET BY MOUTH EVERY DAY WITH SUPPER  Modified Medications   No medications on file  Discontinued Medications   No medications on file    Physical Exam:  There were no vitals filed for this visit. There is no height or weight on file to calculate BMI. Wt Readings from Last 3 Encounters:  05/04/19 130 lb 1.1 oz (59 kg)  11/02/18 130 lb (59 kg)  01/30/18 130 lb (59 kg)     Labs reviewed: Basic Metabolic  Panel: Recent Labs    04/26/19 1843 05/04/19 0035 05/04/19 0704 05/05/19 0623  NA 135 134*  --  138  K 4.1 4.5  --  4.8  CL 96* 97*  --  101  CO2 27 25  --  26  GLUCOSE 130* 311*  --  132*  BUN 20 17  --  10  CREATININE 0.40* 0.57  --  0.37*  CALCIUM 9.4 9.2  --  9.5  TSH  --   --  2.756  --    Liver Function Tests: Recent Labs    01/11/19 2004 01/27/19 1636 04/26/19 1843  AST 16 17 18   ALT 14 12 14   ALKPHOS 76 65 66  BILITOT 0.6 0.1* 0.5  PROT 7.5 6.0* 6.6  ALBUMIN 3.4* 3.0* 3.6   No results for input(s): LIPASE, AMYLASE in the last 8760 hours. No results for input(s): AMMONIA in the last 8760 hours. CBC: Recent Labs    02/25/19 1518 02/25/19 1518 04/26/19 1843 05/04/19 0035 05/05/19 0929  WBC 4.1   < > 4.0 2.5* 2.8*  NEUTROABS 2,341  --  2.1 1.2*  --   HGB 10.8*   < > 11.6* 11.5* 13.2  HCT 33.6*   < > 38.1 36.4 42.2  MCV 91.3   < > 94.3 92.4 92.5  PLT 207   < > 174 173 204   < > = values in this interval not displayed.   Lipid Panel: Recent Labs    09/03/18 1143  CHOL 173  HDL 52  LDLCALC 103*  TRIG 90  CHOLHDL 3.3   TSH: Recent Labs    05/04/19 0704  TSH 2.756   A1C: Lab Results  Component Value Date   HGBA1C 7.9 (H) 05/04/2019     Assessment/Plan 1. Hyperlipidemia associated with type 2 diabetes mellitus (HCC) - atorvastatin (LIPITOR) 20 MG tablet; Take 1 tablet (20 mg total) by mouth daily at 6 PM.  Dispense: 90 tablet; Refill: 1 - Lipid Panel; Future - COMPLETE METABOLIC PANEL WITH GFR; Future  2. Pressure injury of skin, unspecified injury stage, unspecified location -reports area has healed but applying barrier cream to prevent reinjury and pressure reduction   3. Essential hypertension -appears to be stable on current regimen- they have not recently taking bp after medication but in the past BP readings controlled.  - cloNIDine (CATAPRES) 0.1 MG tablet; Take 1 tablet (0.1 mg total) by mouth 2 (two) times daily. May take an  additional tablet if needed SBP >170  Dispense: 60 tablet; Refill: 5 - COMPLETE METABOLIC PANEL WITH GFR; Future - CBC with Differential/Platelet; Future  4. Sebaceous cyst Healed with warm compresses  5. Dementia without behavioral disturbance, unspecified dementia type ([place]) Stable, continues to be total care with dependent. Lives with daughter in law and son. Also has care aides to assist with ADLs  6. Type 2 diabetes mellitus with diabetic polyneuropathy, without long-term current use of insulin (HCC) -blood sugars controlled on metformin 500 mg BID -Encouraged dietary compliance, routine foot care/monitoring and to keep up with diabetic eye exams through ophthalmology  - gabapentin (NEURONTIN) 300 MG capsule; Take 1 capsule (300 mg total) by mouth 2 (two) times daily.  Dispense: 180 capsule; Refill: 1 - Hemoglobin A1c; Future  7. Advance care planning -daughter in law reports palliative care has talked to them (her and her husband) about this but has not completed any documents. Will have them follow up to complete MOST form  8. History of COVID-19 -in December, daughter in law unsure about her getting the vaccine. Recommended getting COVID vaccine at this time.   Next appt: 2 weeks for MOST from completion, labs prior  Jamelle Noy K. Harle Battiest  [facility] & Adult Medicine [phone]   Virtual Visit via Video Note  I connected with Thomasenia Bottoms on 08/05/19 at 11:00 AM EDT by a video enabled telemedicine application and  verified that I am speaking with the correct person using two identifiers.  Location: Patient: home Provider: office   I discussed the limitations of evaluation and management by telemedicine and the availability of in person appointments. The patient expressed understanding and agreed to proceed.    I discussed the assessment and treatment plan with the patient. The patient was provided an opportunity to ask questions and all were answered. The  patient agreed with the plan and demonstrated an understanding of the instructions.   The patient was advised to call back or seek an in-person evaluation if the symptoms worsen or if the condition fails to improve as anticipated.  I provided 30 minutes of non-face-to-face time during this encounter.  Carlos American. Dewaine Oats, AGNP Avs printed and mailed.

## 2019-08-20 ENCOUNTER — Other Ambulatory Visit: Payer: Medicare Other

## 2019-08-20 ENCOUNTER — Other Ambulatory Visit: Payer: Self-pay

## 2019-08-21 ENCOUNTER — Other Ambulatory Visit: Payer: Medicare Other

## 2019-08-21 DIAGNOSIS — E1169 Type 2 diabetes mellitus with other specified complication: Secondary | ICD-10-CM | POA: Diagnosis not present

## 2019-08-21 DIAGNOSIS — I10 Essential (primary) hypertension: Secondary | ICD-10-CM | POA: Diagnosis not present

## 2019-08-21 DIAGNOSIS — E785 Hyperlipidemia, unspecified: Secondary | ICD-10-CM | POA: Diagnosis not present

## 2019-08-21 DIAGNOSIS — E1142 Type 2 diabetes mellitus with diabetic polyneuropathy: Secondary | ICD-10-CM | POA: Diagnosis not present

## 2019-08-22 ENCOUNTER — Other Ambulatory Visit: Payer: Self-pay

## 2019-08-22 ENCOUNTER — Telehealth (INDEPENDENT_AMBULATORY_CARE_PROVIDER_SITE_OTHER): Payer: Medicare Other | Admitting: Nurse Practitioner

## 2019-08-22 ENCOUNTER — Encounter: Payer: Self-pay | Admitting: Nurse Practitioner

## 2019-08-22 ENCOUNTER — Telehealth: Payer: Self-pay

## 2019-08-22 DIAGNOSIS — E1169 Type 2 diabetes mellitus with other specified complication: Secondary | ICD-10-CM | POA: Diagnosis not present

## 2019-08-22 DIAGNOSIS — E1142 Type 2 diabetes mellitus with diabetic polyneuropathy: Secondary | ICD-10-CM | POA: Diagnosis not present

## 2019-08-22 DIAGNOSIS — E785 Hyperlipidemia, unspecified: Secondary | ICD-10-CM

## 2019-08-22 LAB — CBC WITH DIFFERENTIAL/PLATELET
Absolute Monocytes: 272 cells/uL (ref 200–950)
Basophils Absolute: 10 cells/uL (ref 0–200)
Basophils Relative: 0.3 %
Eosinophils Absolute: 19 cells/uL (ref 15–500)
Eosinophils Relative: 0.6 %
HCT: 35.3 % (ref 35.0–45.0)
Hemoglobin: 11.4 g/dL — ABNORMAL LOW (ref 11.7–15.5)
Lymphs Abs: 1456 cells/uL (ref 850–3900)
MCH: 29.4 pg (ref 27.0–33.0)
MCHC: 32.3 g/dL (ref 32.0–36.0)
MCV: 91 fL (ref 80.0–100.0)
MPV: 9.3 fL (ref 7.5–12.5)
Monocytes Relative: 8.5 %
Neutro Abs: 1443 cells/uL — ABNORMAL LOW (ref 1500–7800)
Neutrophils Relative %: 45.1 %
Platelets: 230 10*3/uL (ref 140–400)
RBC: 3.88 10*6/uL (ref 3.80–5.10)
RDW: 11.8 % (ref 11.0–15.0)
Total Lymphocyte: 45.5 %
WBC: 3.2 10*3/uL — ABNORMAL LOW (ref 3.8–10.8)

## 2019-08-22 LAB — COMPLETE METABOLIC PANEL WITH GFR
AG Ratio: 1.3 (calc) (ref 1.0–2.5)
ALT: 11 U/L (ref 6–29)
AST: 12 U/L (ref 10–35)
Albumin: 3.8 g/dL (ref 3.6–5.1)
Alkaline phosphatase (APISO): 78 U/L (ref 37–153)
BUN/Creatinine Ratio: 37 (calc) — ABNORMAL HIGH (ref 6–22)
BUN: 17 mg/dL (ref 7–25)
CO2: 25 mmol/L (ref 20–32)
Calcium: 9.3 mg/dL (ref 8.6–10.4)
Chloride: 101 mmol/L (ref 98–110)
Creat: 0.46 mg/dL — ABNORMAL LOW (ref 0.60–0.88)
GFR, Est African American: 107 mL/min/{1.73_m2} (ref >=60)
GFR, Est Non African American: 93 mL/min/{1.73_m2} (ref >=60)
Globulin: 3 g/dL (calc) (ref 1.9–3.7)
Glucose, Bld: 145 mg/dL — ABNORMAL HIGH (ref 65–99)
Potassium: 4.6 mmol/L (ref 3.5–5.3)
Sodium: 138 mmol/L (ref 135–146)
Total Bilirubin: 0.4 mg/dL (ref 0.2–1.2)
Total Protein: 6.8 g/dL (ref 6.1–8.1)

## 2019-08-22 LAB — LIPID PANEL
Cholesterol: 175 mg/dL (ref <200)
HDL: 47 mg/dL — ABNORMAL LOW (ref >=50)
LDL Cholesterol (Calc): 112 mg/dL (calc) — ABNORMAL HIGH
Non-HDL Cholesterol (Calc): 128 mg/dL (calc) (ref <130)
Total CHOL/HDL Ratio: 3.7 (calc) (ref <5.0)
Triglycerides: 74 mg/dL (ref <150)

## 2019-08-22 LAB — HEMOGLOBIN A1C
Hgb A1c MFr Bld: 8.7 % of total Hgb — ABNORMAL HIGH (ref <5.7)
Mean Plasma Glucose: 203 (calc)
eAG (mmol/L): 11.2 (calc)

## 2019-08-22 MED ORDER — METFORMIN HCL 500 MG PO TABS: 750.0000 mg | ORAL_TABLET | Freq: Two times a day (BID) | ORAL | 1 refills | Status: DC

## 2019-08-22 MED ORDER — ATORVASTATIN CALCIUM 40 MG PO TABS: 40.0000 mg | ORAL_TABLET | Freq: Every day | ORAL | 1 refills | Status: DC

## 2019-08-22 NOTE — Telephone Encounter (Signed)
Patients daughter [name] called because she said her mother had a virtual visitor,or tele-visit today with Janett Billow and a prescription was sent to [facility] for her mother but they only use [facility] for emergency medications and she wants to have the medication sent to her mail pharmacy [facility] have contacted [facility] and resent new prescription to [facility]

## 2019-08-22 NOTE — Progress Notes (Signed)
This service is provided via telemedicine  No vital signs collected/recorded due to the encounter was a telemedicine visit.   Location of patient (ex: home, work): Home.  Patient consents to a telephone visit: Yes  Location of the provider (ex: office, home):  [facility].   Name of any referring provider: N/A  Names of all persons participating in the telemedicine service and their role in the encounter:  Patient, Daughter in [place], [place], [place], [name], NP.    Time spent on call:  8 minutes spent on the phone with Medical Assistant.      Careteam: Patient Care Team: [name], NP as PCP - General (Geriatric Medicine) [name], MD as Consulting Physician (Oncology) [name], [name], LCSW as Social Worker (Licensed Clinical Social Worker) [name], RN as Registered Nurse (Hospice and Palliative Medicine)  PLACE OF SERVICE:  [facility] Advanced Directive information Does Patient Have a Medical Advance Directive?: Yes, Type of Advance Directive: Healthcare Power of Attorney, Does patient want to make changes to medical advance directive?: No - Patient declined  No Known Allergies  Chief Complaint  Patient presents with  . Form Completion    Most Form Completion     HPI: Patient is a 84 y.o. female to complete most from and review lab work   DM- A1c 8.7, reports her stomach had been off for 2 weeks (or longer). [name] reports they have been giving her metformin 500 mg in the morning and 750 mg daily. Fasting blood sugars 100-120. Carb heavy diet due to preference.    Hyperlipidemia- LDL above goal at 112, currently on lipitor 20 mg daily. Diet is limited.   Son who is POA has a meeting this morning would like to hold off on MOST form. Had a DNR filled out by palliative care but unsure about this.     Review of Systems:  Review of Systems  Unable to perform ROS: Dementia    Past Medical  History:  Diagnosis Date  . Dementia ([place])   . Diabetes mellitus type II 03/21/2011  . DVT (deep venous thrombosis) ([place]) 03/21/2011  . Hyperlipidemia 03/21/2011  . Hypertension 03/21/2011  . Lymphedema of leg 03/21/2011  . Pulmonary embolism ([place]) 03/21/2011  . Stroke [facility])    Past Surgical History:  Procedure Laterality Date  . CHOLECYSTECTOMY  2015  . SKIN TAG REMOVAL  07/25/2016  . VAGINAL HYSTERECTOMY     unknown of date  . VASCULAR SURGERY     Social History:   reports that she has never smoked. She has never used smokeless tobacco. She reports that she does not drink alcohol or use drugs.  Family History  Problem Relation Age of Onset  . Heart attack Mother   . COPD Sister   . Stroke Sister   . Bipolar disorder Daughter     Medications: Patient's Medications  New Prescriptions   No medications on file  Previous Medications   A&D OINT    Apply 1 application topically as needed.    ACCU-CHEK SOFTCLIX LANCETS LANCETS    1 each by Other route 2 (two) times daily. Use as instructed Dx: E11.9   ACETAMINOPHEN (TYLENOL) 500 MG TABLET    Take 500 mg by mouth every 8 (eight) hours as needed (for general pain).    ATORVASTATIN (LIPITOR) 20 MG TABLET    Take 1 tablet (20 mg total) by mouth daily at 6 PM.  CHOLECALCIFEROL (VITAMIN D-3) 125 MCG (5000 UT) TABS    Take 5,000 Units by mouth daily.   CLONIDINE (CATAPRES) 0.1 MG TABLET    Take 1 tablet (0.1 mg total) by mouth 2 (two) times daily. May take an additional tablet if needed SBP >170   DICLOFENAC SODIUM (VOLTAREN) 1 % GEL    Apply 1 application topically 2 (two) times daily as needed (pain).    GABAPENTIN (NEURONTIN) 300 MG CAPSULE    Take 1 capsule (300 mg total) by mouth 2 (two) times daily.   GLUCOSE BLOOD (ACCU-CHEK AVIVA PLUS) TEST STRIP    1 each by Other route 2 (two) times daily. E11.9   LOSARTAN (COZAAR) 25 MG TABLET    Take 1 tablet (25 mg total) by mouth daily. Take along with 50 mg to equal 75   LOSARTAN  (COZAAR) 50 MG TABLET    Take 1 tablet (50 mg total) by mouth daily. Take 1 along with 25 mg to equal 75 mg total   METFORMIN (GLUCOPHAGE) 500 MG TABLET    Take 500 mg by mouth 2 (two) times daily with a meal. If blood sugar greater than 160 in the morning patient will take 2 tablets (1000mg ) in the am   NON FORMULARY    Take 1 capsule by mouth See admin instructions. Metagenics - UltraFlora Balance Daily Probiotic Immune Support* capsules- Take 1 capsule by mouth once a day   POLYETHYLENE GLYCOL (MIRALAX / GLYCOLAX) PACKET    Take 17 g by mouth daily as needed for mild constipation (MIX AND DRINK).    WOUND DRESSINGS (MEPILEX EX)    Apply 1 patch topically See admin instructions. Apply to sacrum and change one to two times daily   XARELTO 15 MG TABS TABLET    TAKE ONE TABLET BY MOUTH EVERY DAY WITH SUPPER  Modified Medications   No medications on file  Discontinued Medications   No medications on file    Physical Exam:  There were no vitals filed for this visit. There is no height or weight on file to calculate BMI. Wt Readings from Last 3 Encounters:  05/04/19 130 lb 1.1 oz (59 kg)  11/02/18 130 lb (59 kg)  01/30/18 130 lb (59 kg)      Labs reviewed: Basic Metabolic Panel: Recent Labs    05/04/19 0035 05/04/19 0704 05/05/19 0623 08/21/19 1101  NA 134*  --  138 138  K 4.5  --  4.8 4.6  CL 97*  --  101 101  CO2 25  --  26 25  GLUCOSE 311*  --  132* 145*  BUN 17  --  10 17  CREATININE 0.57  --  0.37* 0.46*  CALCIUM 9.2  --  9.5 9.3  TSH  --  2.756  --   --    Liver Function Tests: Recent Labs    01/11/19 2004 01/11/19 2004 01/27/19 1636 04/26/19 1843 08/21/19 1101  AST 16   < > 17 18 12   ALT 14   < > 12 14 11   ALKPHOS 76  --  65 66  --   BILITOT 0.6   < > 0.1* 0.5 0.4  PROT 7.5   < > 6.0* 6.6 6.8  ALBUMIN 3.4*  --  3.0* 3.6  --    < > = values in this interval not displayed.   No results for input(s): LIPASE, AMYLASE in the last 8760 hours. No results for  input(s): AMMONIA in the last 8760  hours. CBC: Recent Labs    04/26/19 1843 04/26/19 1843 05/04/19 0035 05/05/19 0929 08/21/19 1101  WBC 4.0   < > 2.5* 2.8* 3.2*  NEUTROABS 2.1  --  1.2*  --  1,443*  HGB 11.6*   < > 11.5* 13.2 11.4*  HCT 38.1   < > 36.4 42.2 35.3  MCV 94.3   < > 92.4 92.5 91.0  PLT 174   < > 173 204 230   < > = values in this interval not displayed.   Lipid Panel: Recent Labs    09/03/18 1143 08/21/19 1101  CHOL 173 175  HDL 52 47*  LDLCALC 103* 112*  TRIG 90 74  CHOLHDL 3.3 3.7   TSH: Recent Labs    05/04/19 0704  TSH 2.756   A1C: Lab Results  Component Value Date   HGBA1C 8.7 (H) 08/21/2019     Assessment/Plan 1. Hyperlipidemia associated with type 2 diabetes mellitus ([place]) -goal LDL <70, she has been trending up. Will increase Lipitor to get her to goal but will have family monitor her for myalgias or increase pain. Diet is limited.  - atorvastatin (LIPITOR) 40 MG tablet; Take 1 tablet (40 mg total) by mouth daily at 6 PM.  Dispense: 90 tablet; Refill: 1  2. Type 2 diabetes mellitus with diabetic polyneuropathy, without long-term current use of insulin (HCC) -A1c not at goal. Only checks fasting blood sugars which appear to be controlled however a1c now at 8.7, taking 750 mg in the evening and 500 mg during the day, will slightly increase metformin to 750 mg twice daily at this time. And monitor, she has a very carb heavy diet and limited.  - metFORMIN (GLUCOPHAGE) 500 MG tablet; Take 1.5 tablets (750 mg total) by mouth 2 (two) times daily with a meal.  Dispense: 90 tablet; Refill: 1  Next appt: will plan to do MOST form tomorrow at 3;45 when son is available.e  Carlos American. Harle Battiest  [facility] & Adult Medicine [phone]   Virtual Visit via Video Note  I connected with [name] and daughter in law 08/22/19 at  9:30 AM EDT by a video enabled telemedicine application and verified that I am speaking with the correct  person using two identifiers.  Location: Patient: home Provider: [facility]   I discussed the limitations of evaluation and management by telemedicine and the availability of in person appointments. The patient expressed understanding and agreed to proceed.    I discussed the assessment and treatment plan with the patient. The patient was provided an opportunity to ask questions and all were answered. The patient agreed with the plan and demonstrated an understanding of the instructions.   The patient was advised to call back or seek an in-person evaluation if the symptoms worsen or if the condition fails to improve as anticipated.  I provided 22 mins minutes of non-face-to-face time during this encounter.  Carlos American. Dewaine Oats, AGNP Avs printed and mailed.

## 2019-08-23 ENCOUNTER — Ambulatory Visit (INDEPENDENT_AMBULATORY_CARE_PROVIDER_SITE_OTHER): Payer: Medicare Other | Admitting: Nurse Practitioner

## 2019-08-23 ENCOUNTER — Other Ambulatory Visit: Payer: Self-pay

## 2019-08-23 ENCOUNTER — Encounter: Payer: Self-pay | Admitting: Nurse Practitioner

## 2019-08-23 DIAGNOSIS — Z7189 Other specified counseling: Secondary | ICD-10-CM | POA: Diagnosis not present

## 2019-08-23 NOTE — Progress Notes (Signed)
This service is provided via telemedicine  No vital signs collected/recorded due to the encounter was a telemedicine visit.   Location of patient (ex: home, work):  Home   Patient consents to a telephone visit:  Yes, see telephone encounter with annual consent dated. 08/23/2019  Location of the provider (ex: office, home):  [facility], Office   Name of any referring provider:  N/A  Names of all persons participating in the telemedicine service and their role in the encounter:  S.Chrae B/CMA, [name], NP, and Patient   Time spent on call:  3 min with medical assistant       Careteam: Patient Care Team: [name], NP as PCP - General (Geriatric Medicine) [name], MD as Consulting Physician (Oncology) [name], [name], LCSW as Social Worker (Licensed Clinical Social Worker) [name], RN as Registered Nurse (Hospice and Palliative Medicine)  PLACE OF SERVICE:  [place] Directive information Does Patient Have a Medical Advance Directive?: Yes, Type of Advance Directive: Healthcare Power of Attorney, Does patient want to make changes to medical advance directive?: No - Patient declined  No Known Allergies  Chief Complaint  Patient presents with  . Advanced Directive    Advance Care Planning. Telehealth/Video Visit      HPI: Patient is a 84 y.o. female for advance care planning [name] found a file that pt had completed and due to pts dementia this was reviewed this to complete MOST form. Pts son is Healthcare POA as well.    Review of Systems:  Review of Systems  Unable to perform ROS: Dementia    Past Medical History:  Diagnosis Date  . Dementia ([place])   . Diabetes mellitus type II 03/21/2011  . DVT (deep venous thrombosis) ([place]) 03/21/2011  . Hyperlipidemia 03/21/2011  . Hypertension 03/21/2011  . Lymphedema of leg 03/21/2011  . Pulmonary embolism ([place]) 03/21/2011  . Stroke [facility])    Past Surgical  History:  Procedure Laterality Date  . CHOLECYSTECTOMY  2015  . SKIN TAG REMOVAL  07/25/2016  . VAGINAL HYSTERECTOMY     unknown of date  . VASCULAR SURGERY     Social History:   reports that she has never smoked. She has never used smokeless tobacco. She reports that she does not drink alcohol or use drugs.  Family History  Problem Relation Age of Onset  . Heart attack Mother   . COPD Sister   . Stroke Sister   . Bipolar disorder Daughter     Medications: Patient's Medications  New Prescriptions   No medications on file  Previous Medications   A&D OINT    Apply 1 application topically as needed.    ACCU-CHEK SOFTCLIX LANCETS LANCETS    1 each by Other route 2 (two) times daily. Use as instructed Dx: E11.9   ACETAMINOPHEN (TYLENOL) 500 MG TABLET    Take 500 mg by mouth every 8 (eight) hours as needed (for general pain).    ATORVASTATIN (LIPITOR) 40 MG TABLET    Take 1 tablet (40 mg total) by mouth daily at 6 PM.   CHOLECALCIFEROL (VITAMIN D-3) 125 MCG (5000 UT) TABS    Take 5,000 Units by mouth daily.   CLONIDINE (CATAPRES) 0.1 MG TABLET    Take 1 tablet (0.1 mg total) by mouth 2 (two) times daily. May take an additional tablet if needed SBP >170   DICLOFENAC SODIUM (VOLTAREN) 1 % GEL    Apply 1  application topically 2 (two) times daily as needed (pain).    GABAPENTIN (NEURONTIN) 300 MG CAPSULE    Take 1 capsule (300 mg total) by mouth 2 (two) times daily.   GLUCOSE BLOOD (ACCU-CHEK AVIVA PLUS) TEST STRIP    1 each by Other route 2 (two) times daily. E11.9   LOSARTAN (COZAAR) 25 MG TABLET    Take 1 tablet (25 mg total) by mouth daily. Take along with 50 mg to equal 75   LOSARTAN (COZAAR) 50 MG TABLET    Take 1 tablet (50 mg total) by mouth daily. Take 1 along with 25 mg to equal 75 mg total   METFORMIN (GLUCOPHAGE) 500 MG TABLET    Take 1.5 tablets (750 mg total) by mouth 2 (two) times daily with a meal.   NON FORMULARY    Take 1 capsule by mouth See admin instructions. [employer]ics  - UltraFlora Balance Daily Probiotic Immune Support* capsules- Take 1 capsule by mouth once a day   POLYETHYLENE GLYCOL (MIRALAX / GLYCOLAX) PACKET    Take 17 g by mouth daily as needed for mild constipation (MIX AND DRINK).    WOUND DRESSINGS (MEPILEX EX)    Apply 1 patch topically See admin instructions. Apply to sacrum and change one to two times daily   XARELTO 15 MG TABS TABLET    TAKE ONE TABLET BY MOUTH EVERY DAY WITH SUPPER  Modified Medications   No medications on file  Discontinued Medications   No medications on file    Physical Exam:  There were no vitals filed for this visit. There is no height or weight on file to calculate BMI. Wt Readings from Last 3 Encounters:  05/04/19 130 lb 1.1 oz (59 kg)  11/02/18 130 lb (59 kg)  01/30/18 130 lb (59 kg)   Labs reviewed: Basic Metabolic Panel: Recent Labs    05/04/19 0035 05/04/19 0704 05/05/19 0623 08/21/19 1101  NA 134*  --  138 138  K 4.5  --  4.8 4.6  CL 97*  --  101 101  CO2 25  --  26 25  GLUCOSE 311*  --  132* 145*  BUN 17  --  10 17  CREATININE 0.57  --  0.37* 0.46*  CALCIUM 9.2  --  9.5 9.3  TSH  --  2.756  --   --    Liver Function Tests: Recent Labs    01/11/19 2004 01/11/19 2004 01/27/19 1636 04/26/19 1843 08/21/19 1101  AST 16   < > 17 18 12   ALT 14   < > 12 14 11   ALKPHOS 76  --  65 66  --   BILITOT 0.6   < > 0.1* 0.5 0.4  PROT 7.5   < > 6.0* 6.6 6.8  ALBUMIN 3.4*  --  3.0* 3.6  --    < > = values in this interval not displayed.   No results for input(s): LIPASE, AMYLASE in the last 8760 hours. No results for input(s): AMMONIA in the last 8760 hours. CBC: Recent Labs    04/26/19 1843 04/26/19 1843 05/04/19 0035 05/05/19 0929 08/21/19 1101  WBC 4.0   < > 2.5* 2.8* 3.2*  NEUTROABS 2.1  --  1.2*  --  1,443*  HGB 11.6*   < > 11.5* 13.2 11.4*  HCT 38.1   < > 36.4 42.2 35.3  MCV 94.3   < > 92.4 92.5 91.0  PLT 174   < > 173 204 230   < > =  values in this interval not displayed.    Lipid Panel: Recent Labs    09/03/18 1143 08/21/19 1101  CHOL 173 175  HDL 52 47*  LDLCALC 103* 112*  TRIG 90 74  CHOLHDL 3.3 3.7   TSH: Recent Labs    05/04/19 0704  TSH 2.756   A1C: Lab Results  Component Value Date   HGBA1C 8.7 (H) 08/21/2019     Assessment/Plan 1. Advance care planning -MOST from completed with pt's son and daughter in law. All questions answered.  - DNR (Do Not Resuscitate) -printed most on pink paper and mailed to pt, also signed updated DNR to mail.   Next appt: 10/29/2019 [name]. Harle Battiest  [facility] & Adult Medicine [phone]   Virtual Visit via Video Note  I connected with [name] on 08/23/19 at  3:45 PM EDT by a video enabled telemedicine application and verified that I am speaking with the correct person using two identifiers.  Location: Patient: home Provider: office    I discussed the limitations of evaluation and management by telemedicine and the availability of in person appointments. The patient expressed understanding and agreed to proceed.    I discussed the assessment and treatment plan with the patient. The patient was provided an opportunity to ask questions and all were answered. The patient agreed with the plan and demonstrated an understanding of the instructions.   The patient was advised to call back or seek an in-person evaluation if the symptoms worsen or if the condition fails to improve as anticipated.  I provided 16 minutes of non-face-to-face time during this encounter.  [name]. Dewaine Oats, AGNP Avs printed and mailed.

## 2019-08-28 ENCOUNTER — Other Ambulatory Visit: Payer: Medicare Other

## 2019-08-29 ENCOUNTER — Other Ambulatory Visit: Payer: Self-pay

## 2019-08-29 ENCOUNTER — Other Ambulatory Visit: Payer: Medicare Other

## 2019-08-29 DIAGNOSIS — Z515 Encounter for palliative care: Secondary | ICD-10-CM

## 2019-08-30 NOTE — Progress Notes (Signed)
COMMUNITY PALLIATIVE CARE SW NOTE  PATIENT NAME: Kerrin Franey DOB: 03-13-1936 MRN: [id number]  PRIMARY CARE PROVIDER: [name], NP  RESPONSIBLE PARTY:  Acct ID - Guarantor Home Phone Work Phone Relationship Acct Type  [id number] JOLANDA, [phone]  Self P/F     [place], [place], [place] [address]     PLAN OF CARE and INTERVENTIONS:             1. GOALS OF CARE/ ADVANCE CARE PLANNING: Patient is a DNR-form is located in the home. Family completed a MOST form with primary care physician. The MOST form indicated: DNR, Comfort Care, Limited IV & ABT and No feeding tube. SW provided additional education regarding the form to PCG. 2. SOCIAL/EMOTIONAL/SPIRITUAL ASSESSMENT/ INTERVENTIONS:  SW completed [place] visit patient/family per request of PCG-[name] due to COVID-19 concerns. SW introduced herself as new palliative care social worker to [place]. PCG-[name] provided a status update on patient. [name] reported that patient had experienced diarrhea for about two week, but is now resolved. She has a wound on her foot that will close and open intermittently. The wound is currently open and they are treating it currently with barrier cream and A & D ointment.The PCG reported that a MOST form was completed with patient's primary care physician during the recent [place] visit. PCG had question as to how the form worked with the DNR and living will that patient already have in place-SW provided education. Patient continues to have paid caregivers for 11 hours during the day. The PCG is scheduled to have surgery on Monday and is relying heavily on extended family members and the paid staff to care for patient as she has a long recovery process that will include physical therapy.  SW provided introductions, supportive presence and counseling, active listing and reassurance of support.  3. PATIENT/CAREGIVER EDUCATION/ COPING: Education regarding advanced directives provided. SW  reinforced her supportive role with patient/famiy. PCG-[name] report that she is coping adequately as she has good family and spiritual support, along with paid caregivers.  4. PERSONAL EMERGENCY PLAN: The family can contact patient's primary care physician regarding any changes in patient's condition. 911 can be activated for emergencies.  5. COMMUNITY RESOURCES COORDINATION/ HEALTH CARE NAVIGATION: Patient receiving private caregiver support for 11 hours a day. SW provided clarification and education regarding the MOST form.  6. FINANCIAL/LEGAL CONCERNS/INTERVENTIONS: No financial or legal issues noted on this visit.      SOCIAL HX:  Social History   Tobacco Use  . Smoking status: Never Smoker  . Smokeless tobacco: Never Used  Substance Use Topics  . Alcohol use: No    CODE STATUS:   Code Status: DNR  ADVANCED DIRECTIVES: N MOST FORM COMPLETE: Yes HOSPICE EDUCATION PROVIDED: Yes  PPS: Patient continues to rely on family and paid caregivers for ADL's.    Due to COVID-19, this patient contact has been completed by telephone from my office and was initiated and consent by patient's PCG-[name] Marshell Levan. This was a scheduled visit.   SW spent 30 minutes on telephone with PCG provided introductions, education/clarification, supportive presence and counseling and active listening.   [address] [place], [place]

## 2019-09-19 ENCOUNTER — Telehealth: Payer: Self-pay

## 2019-09-27 ENCOUNTER — Other Ambulatory Visit: Payer: Self-pay

## 2019-09-27 ENCOUNTER — Other Ambulatory Visit: Payer: Medicare Other | Admitting: *Deleted

## 2019-09-27 DIAGNOSIS — Z515 Encounter for palliative care: Secondary | ICD-10-CM

## 2019-10-02 NOTE — Progress Notes (Signed)
COMMUNITY PALLIATIVE CARE RN NOTE  PATIENT NAME: [name] DOB: 06-Sep-1935 MRN: [id number]  PRIMARY CARE PROVIDER: [name], NP  RESPONSIBLE PARTY:  Acct ID - Guarantor Home Phone Work Phone Relationship Acct Type  [id number] [name], [name]  Self P/F     [address], [place], [place] [address]   Due to the COVID-19 crisis, this virtual check-in visit was done via telephone from my office and it was initiated and consent by this patient and or family.  PLAN OF CARE and INTERVENTION:  1. ADVANCE CARE PLANNING/GOALS OF CARE: Goal is for patient to remain at home with her family and avoid hospitalizations. She has a DNR. 2. PATIENT/CAREGIVER EDUCATION: Symptom management, safe mobility/transfers, management of skin breakdown 3. DISEASE STATUS: Virtual check-in visit completed via telephone. Patient is not complaining of any pain. No issues with shortness of breath. She remains confused, but able to answer some simple questions with short replies. She is total care for all ADLs. Her intake is good. She is not having any issues with swallowing. Coughing only noted periodically during meals. They continue to check her blood sugars daily and feel it is well controlled on Metformin BID. They do adjust this medication if they feel it is too elevated or lower than usual. Her dose has been increased to 750 mg twice daily. She does have elevated blood pressures at times. She continues on Losartan and Clonidine and patient can have an additional Clonidine if needed. [name], [name], states that they do not like giving any BP meds if it reads below 150/90 because they feel this will cause patient to be too lethargic. Her Lipitor was recently increased to 40 mg d/t her elevated LDLs. She has edema in bilateral lower extremities. She does have areas on her bottom that becomes irritated, but are not open at this time. They are placing A&D ointment to her bottom after each incontinent  episode for protection. She is incontinent of both bowel and bladder. [name] asked about patient receiving in home Covid vaccines. Explained that we have a partnership with [place] where we can add patient's to the in home Covid vaccination list, and they will call, screen and schedule a date/time to administer this. Criteria is that patient must be homebound, and [place] meets this criteria. I have added her to the list as requested. Will continue to monitor.  HISTORY OF PRESENT ILLNESS: This is a 84 yo female with a diagnosis of dementia. She lives in the home with her son and daughter in law. Palliative care team continues to follow patient. Will continue to visit monthly and PRN.   CODE STATUS: DNR ADVANCED DIRECTIVES: Y MOST FORM: no PPS: 30%   (Duration of visit and documentation 45 minutes)   Daryl Eastern, RN BSN

## 2019-10-24 ENCOUNTER — Other Ambulatory Visit: Payer: Medicare Other | Admitting: *Deleted

## 2019-10-24 ENCOUNTER — Other Ambulatory Visit: Payer: Self-pay

## 2019-10-24 DIAGNOSIS — Z515 Encounter for palliative care: Secondary | ICD-10-CM

## 2019-10-29 ENCOUNTER — Telehealth: Payer: Self-pay | Admitting: Physician Assistant

## 2019-10-29 ENCOUNTER — Encounter: Payer: Medicare Other | Admitting: Nurse Practitioner

## 2019-10-29 NOTE — Telephone Encounter (Signed)
I connected by phone with [name] and/or patient's caregiver on 10/29/2019 at 5:24 PM to discuss the potential vaccination through our Homebound vaccination initiative.   Prevaccination Checklist for COVID-19 Vaccines  1.  Are you feeling sick today? no  2.  Have you ever received a dose of a COVID-19 vaccine?  no      If yes, which one? None   3.  Have you ever had an allergic reaction: (This would include a severe reaction [ e.g., anaphylaxis] that required treatment with epinephrine or EpiPen or that caused you to go to the hospital.  It would also include an allergic reaction that occurred within 4 hours that caused hives, swelling, or respiratory distress, including wheezing.) A.  A previous dose of COVID-19 vaccine. no  B.  A vaccine or injectable therapy that contains multiple components, one of which is a COVID-19 vaccine component, but it is not known which component elicited the immediate reaction. no  C.  Are you allergic to polyethylene glycol? no  D. Are you allergic to Polysorbate, which is found in some vaccines, film coated tablets and intravenous steroids?  no   4.  Have you ever had an allergic reaction to another vaccine (other than COVID-19 vaccine) or an injectable medication? (This would include a severe reaction [ e.g., anaphylaxis] that required treatment with epinephrine or EpiPen or that caused you to go to the hospital.  It would also include an allergic reaction that occurred within 4 hours that caused hives, swelling, or respiratory distress, including wheezing.)  no   5.  Have you ever had a severe allergic reaction (e.g., anaphylaxis) to something other than a component of the COVID-19 vaccine, or any vaccine or injectable medication?  This would include food, pet, venom, environmental, or oral medication allergies.  no   6.  Have you received any vaccine in the last 14 days? no   7.  Have you ever had a positive test for COVID-19 or has a doctor ever told you  that you had COVID-19?  yes, December 2020   8.  Have you received passive antibody therapy (monoclonal antibodies or convalescent serum) as a treatment for COVID-19? no   9.  Do you have a weakened immune system caused by something such as HIV infection or cancer or do you take immunosuppressive drugs or therapies?  no   10.  Do you have a bleeding disorder or are you taking a blood thinner? yes   11.  Are you pregnant or breast-feeding? no   12.  Do you have dermal fillers? no   __________________   This patient is a 84 y.o. female that meets the FDA criteria to receive homebound vaccination. Patient or parent/caregiver understands they have the option to accept or refuse homebound vaccination.  Patient passed the pre-screening checklist and would like to proceed with homebound vaccination.  Based on questionnaire above, I recommend the patient be observed for 30 minutes.  There are an estimated 1 other household members/caregivers who are also interested in receiving the vaccine. [name])   I will send the patient's information to our scheduling team who will reach out to schedule the patient and potential caregiver/family members for homebound vaccination.    Angelena Form 10/29/2019 5:24 PM

## 2019-10-31 NOTE — Progress Notes (Signed)
COMMUNITY PALLIATIVE CARE RN NOTE  PATIENT NAME: [name] DOB: 10-20-1935 MRN: [id number]  PRIMARY CARE PROVIDER: [name], NP  RESPONSIBLE PARTY: [name] (daughter) Acct ID - Guarantor Home Phone Work Phone Relationship Acct Type  [id number] CELICA, [phone]  Self P/F     [address], [place], [place] [address]   Due to the COVID-19 crisis, this virtual check-in visit was done via telephone from my office and it was initiated and consent by this patient and or family.  PLAN OF CARE and INTERVENTION:  1. ADVANCE CARE PLANNING/GOALS OF CARE: Goal is for patient to remain in the home with her son and daughter-in-law. She has a DNR. 2. PATIENT/CAREGIVER EDUCATION: Symptom management, skin breakdown management, s/s of infection 3. DISEASE STATUS: Virtual check-in visit completed via telephone. Patient currently sitting up in her wheelchair just finishing her breakfast. She denies pain at this time. No breathing issues. She has had a runny nose with slightly discolored drainage for a few days, however this seems to be clearing up. She is unsure if it was an allergy or cold. No associated fever or cough. She continues to require total assistance with all ADLs. She frequently leans to the right d/t truncal weakness. She is incontinent of both bowel and bladder, but at times they are able to get her to the bedside commode. Her daytime blood pressures are usually elevated, but comes down after taking her medications. Her intake is fair. It is usually better at breakfast and dinner, and "iffy" at lunchtime. Her blood sugar last night was elevated at 210, but she feels this is due to patient completing her lunch meal and having dinner more closer today. It is usually under 150. They give 1/2 to 2 tablets twice daily of Metformin, according to her blood sugar readings. She has an occasional cough with meals. [name] says that her bottom is flaring up slightly. They have been using A&D  ointment, but is going to start using this during the day, but placing an Allevyn pad on during the night since patient is incontinent. She has an irritated area noted behind her left ear. She has some crusted/matted type clear yellow drainage noted. Unsure of cause. Suspect that it may have occurred from the oils they use in her hair, and is now keeping her hair away from this area. It is slowly looking better and she does not suspect that it is infected. She will continue to monitor this. She continues with bilateral lower extremity and pedal edema and they keep her legs elevated. She is on the homebound Covid-19 vaccination list and is awaiting a call to schedule this. Will continue to monitor.  HISTORY OF PRESENT ILLNESS:This is a 84 yo female with a diagnosis of dementia. She lives in the home with her son and daughter in law. Palliative care team continues to follow patient. Will continue to visit monthly and PRN.     CODE STATUS: DNR  ADVANCED DIRECTIVES: Y MOST FORM: no PPS: 30%   (Duration of visit and documentation 45 minutes)   Daryl Eastern, RN BSN

## 2019-11-05 ENCOUNTER — Ambulatory Visit (INDEPENDENT_AMBULATORY_CARE_PROVIDER_SITE_OTHER): Payer: Medicare Other | Admitting: Nurse Practitioner

## 2019-11-05 ENCOUNTER — Encounter: Payer: Self-pay | Admitting: Nurse Practitioner

## 2019-11-05 ENCOUNTER — Other Ambulatory Visit: Payer: Self-pay

## 2019-11-05 ENCOUNTER — Telehealth: Payer: Self-pay

## 2019-11-05 DIAGNOSIS — Z0000 Encounter for general adult medical examination without abnormal findings: Secondary | ICD-10-CM

## 2019-11-05 DIAGNOSIS — I10 Essential (primary) hypertension: Secondary | ICD-10-CM | POA: Diagnosis not present

## 2019-11-05 DIAGNOSIS — Z86718 Personal history of other venous thrombosis and embolism: Secondary | ICD-10-CM

## 2019-11-05 MED ORDER — LOSARTAN POTASSIUM 50 MG PO TABS: 50.0000 mg | ORAL_TABLET | Freq: Every day | ORAL | 1 refills | Status: DC

## 2019-11-05 MED ORDER — RIVAROXABAN 15 MG PO TABS: ORAL_TABLET | ORAL | 1 refills | Status: DC

## 2019-11-05 MED ORDER — LOSARTAN POTASSIUM 25 MG PO TABS: 25.0000 mg | ORAL_TABLET | Freq: Every day | ORAL | 1 refills | Status: DC

## 2019-11-05 NOTE — Progress Notes (Signed)
This service is provided via telemedicine  No vital signs collected/recorded due to the encounter was a telemedicine visit.   Location of patient (ex: home, work):  Home  Patient consents to a telephone visit:  [name] patient's daughter consented (patient unable to consent)  Location of the provider (ex: office, home):  [place]  Name of any referring provider:  N/A  Names of all persons participating in the telemedicine service and their role in the encounter:  [name], Nurse Practitioner, Carroll Kinds, CMA, and patient's daughter.   Time spent on call:  12 minutes   Subjective:   [name] is a 84 y.o. female who presents for Medicare Annual (Subsequent) preventive examination.  Review of Systems     Cardiac Risk Factors include: sedentary lifestyle;hypertension;dyslipidemia;diabetes mellitus;advanced age (>65men, >43 women)     Objective:    There were no vitals filed for this visit. There is no height or weight on file to calculate BMI.  Advanced Directives 11/05/2019 08/23/2019 08/22/2019 08/05/2019 06/13/2019 05/04/2019 11/02/2018  Does Patient Have a Medical Advance Directive? Yes Yes Yes Yes Yes Unable to assess, patient is non-responsive or altered mental status Yes  Type of Advance Directive [place];Living will;Out of facility DNR (pink MOST or yellow form) Healthcare Power of [place] of [place];Living will - [place];Living will  Does patient want to make changes to medical advance directive? No - Patient declined No - Patient declined No - Patient declined No - Guardian declined No - Guardian declined - -  Copy of [place] in Chart? Yes - validated most recent copy scanned in chart (See row information) Yes - validated most recent copy scanned in chart (See row information) Yes - validated most recent copy  scanned in chart (See row information) Yes - validated most recent copy scanned in chart (See row information) Yes - validated most recent copy scanned in chart (See row information) - -    Current Medications (verified) Outpatient Encounter Medications as of 11/05/2019  Medication Sig  . A&D OINT Apply 1 application topically as needed.   . Accu-Chek Softclix Lancets lancets 1 each by Other route 2 (two) times daily. Use as instructed Dx: E11.9  . acetaminophen (TYLENOL) 500 MG tablet Take 500 mg by mouth every 8 (eight) hours as needed (for general pain).   Marland Kitchen atorvastatin (LIPITOR) 40 MG tablet Take 1 tablet (40 mg total) by mouth daily at 6 PM.  . Cholecalciferol (VITAMIN D-3) 125 MCG (5000 UT) TABS Take 5,000 Units by mouth daily.  . cloNIDine (CATAPRES) 0.1 MG tablet Take 1 tablet (0.1 mg total) by mouth 2 (two) times daily. May take an additional tablet if needed SBP >170  . diclofenac sodium (VOLTAREN) 1 % GEL Apply 1 application topically 2 (two) times daily as needed (pain).   Marland Kitchen gabapentin (NEURONTIN) 300 MG capsule Take 1 capsule (300 mg total) by mouth 2 (two) times daily.  Marland Kitchen glucose blood (ACCU-CHEK AVIVA PLUS) test strip 1 each by Other route 2 (two) times daily. E11.9  . losartan (COZAAR) 25 MG tablet Take 1 tablet (25 mg total) by mouth daily. Take along with 50 mg to equal 75  . losartan (COZAAR) 50 MG tablet Take 1 tablet (50 mg total) by mouth daily. Take 1 along with 25 mg to equal 75 mg total  . metFORMIN (GLUCOPHAGE) 500 MG tablet Take 1.5 tablets (750 mg  total) by mouth 2 (two) times daily with a meal.  . NON FORMULARY Take 1 capsule by mouth See admin instructions. Metagenics - UltraFlora Balance Daily Probiotic Immune Support* capsules- Take 1 capsule by mouth once a day  . polyethylene glycol (MIRALAX / GLYCOLAX) packet Take 17 g by mouth daily as needed for mild constipation (MIX AND DRINK).   . Rivaroxaban (XARELTO) 15 MG TABS tablet TAKE ONE TABLET BY MOUTH EVERY DAY  WITH SUPPER  . Wound Dressings (MEPILEX EX) Apply 1 patch topically See admin instructions. Apply to sacrum and change one to two times daily as needed  . [DISCONTINUED] losartan (COZAAR) 25 MG tablet Take 1 tablet (25 mg total) by mouth daily. Take along with 50 mg to equal 75  . [DISCONTINUED] losartan (COZAAR) 50 MG tablet Take 1 tablet (50 mg total) by mouth daily. Take 1 along with 25 mg to equal 75 mg total  . [DISCONTINUED] XARELTO 15 MG TABS tablet TAKE ONE TABLET BY MOUTH EVERY DAY WITH SUPPER   No facility-administered encounter medications on file as of 11/05/2019.    Allergies (verified) Patient has no known allergies.   History: Past Medical History:  Diagnosis Date  . Dementia ([place])   . Diabetes mellitus type II 03/21/2011  . DVT (deep venous thrombosis) ([place]) 03/21/2011  . Hyperlipidemia 03/21/2011  . Hypertension 03/21/2011  . Lymphedema of leg 03/21/2011  . Pulmonary embolism ([place]) 03/21/2011  . Stroke [facility])    Past Surgical History:  Procedure Laterality Date  . CHOLECYSTECTOMY  2015  . SKIN TAG REMOVAL  07/25/2016  . VAGINAL HYSTERECTOMY     unknown of date  . VASCULAR SURGERY     Family History  Problem Relation Age of Onset  . Heart attack Mother   . COPD Sister   . Stroke Sister   . Bipolar disorder Daughter    Social History   Socioeconomic History  . Marital status: Widowed    Spouse name: Not on file  . Number of children: Not on file  . Years of education: Not on file  . Highest education level: Not on file  Occupational History  . Not on file  Tobacco Use  . Smoking status: Never Smoker  . Smokeless tobacco: Never Used  Vaping Use  . Vaping Use: Never used  Substance and Sexual Activity  . Alcohol use: No  . Drug use: No  . Sexual activity: Never  Other Topics Concern  . Not on file  Social History Narrative   Diet?       Do you drink/eat things with caffeine? occasional diet soda      Marital status?   Widowed/divorced                                  What year were you married? [place] you live in a house, apartment, assisted living, condo, trailer, etc.? home      Is it one or more stories? More (mainly living on one floor)      How many persons live in your home?      Do you have any pets in your home? (please list) no      Current or past profession: teacher      Do you exercise?       Physical therapy  Type & how often? 2-3 per week      Do you have a living will? yes      Do you have a DNR form?      ?                             If not, do you want to discuss one?      Do you have signed POA/HPOA for forms? Yes   11/29/16 Lives at home wih family, son, daughter in law, patrice and grand children.  Has 3 children.  She is widowed, retired.  Has BA- education level.     Social Determinants of Health   Financial Resource Strain:   . Difficulty of Paying Living Expenses:   Food Insecurity:   . Worried About Charity fundraiser in the Last Year:   . Arboriculturist in the Last Year:   Transportation Needs:   . Film/video editor (Medical):   Marland Kitchen Lack of Transportation (Non-Medical):   Physical Activity:   . Days of Exercise per Week:   . Minutes of Exercise per Session:   Stress:   . Feeling of Stress :   Social Connections:   . Frequency of Communication with Friends and Family:   . Frequency of Social Gatherings with Friends and Family:   . Attends Religious Services:   . Active Member of Clubs or Organizations:   . Attends Archivist Meetings:   Marland Kitchen Marital Status:     Tobacco Counseling Counseling given: Not Answered   Clinical Intake:  Pre-visit preparation completed: Yes  Pain : No/denies pain     BMI - recorded: 20.37 Nutritional Risks: Non-healing wound, Unintentional weight loss Diabetes: Yes  How often do you need to have someone help you when you read instructions, pamphlets, or other written materials from your doctor  or pharmacy?: 5 - Always  Diabetic?yes         Activities of Daily Living In your present state of health, do you have any difficulty performing the following activities: 11/05/2019  Hearing? N  Vision? N  Difficulty concentrating or making decisions? Y  Walking or climbing stairs? Y  Dressing or bathing? Y  Doing errands, shopping? Y  Preparing Food and eating ? Y  Comment family feeds her  Using the Toilet? Y  Comment incontient of bowel and bladder  In the past six months, have you accidently leaked urine? Y  Do you have problems with loss of bowel control? Y  Managing your Medications? Y  Managing your Finances? Y  Housekeeping or managing your Housekeeping? Y  Some recent data might be hidden    Patient Care Team: [name], NP as PCP - General (Geriatric Medicine) [name], MD as Consulting Physician (Oncology) [name], [name], LCSW as Social Worker (Licensed Clinical Social Worker) [name], RN as Registered Nurse [facility] and Palliative Medicine)  Indicate any recent [place] you may have received from other than [facility] providers in the past year (date may be approximate).     Assessment:   This is a routine wellness examination for [employer].  Hearing/Vision screen  Hearing Screening   125Hz  250Hz  500Hz  1000Hz  2000Hz  3000Hz  4000Hz  6000Hz  8000Hz   Right ear:           Left ear:           Comments: No problems with hearing  Vision Screening Comments:  No problems with her vision  Dietary issues and exercise activities discussed: Current Exercise Habits: The patient does not participate in regular exercise at present  Goals    . Increase strength     Starting 07/26/16 I will increase my mobility and strength with OT and PT.      Depression Screen PHQ 2/9 Scores 11/05/2019 10/24/2018 10/24/2018 05/04/2018 10/18/2017 04/11/2017 07/26/2016  PHQ - 2 Score 0 0 0 0 0 0 0    Fall Risk Fall Risk  11/05/2019 08/22/2019 06/13/2019 05/06/2019  01/28/2019  Falls in the past year? 1 0 0 1 1  Number falls in past yr: 1 0 0 1 0  Comment - - - - -  Injury with Fall? 0 0 0 0 0  Risk Factor Category  - - - - -  Risk for fall due to : - - - History of fall(s);Impaired balance/gait;Impaired mobility -  Follow up - - - - -    Any stairs in or around the home? Yes  If so, are there any without handrails? No  Home free of loose throw rugs in walkways, pet beds, electrical cords, etc? Yes  Adequate lighting in your home to reduce risk of falls? Yes   ASSISTIVE DEVICES UTILIZED TO PREVENT FALLS:  Life alert? No  Use of a cane, walker or w/c? Yes  Grab bars in the bathroom? Yes  Shower chair or bench in shower? Yes  Elevated toilet seat or a handicapped toilet? Yes   TIMED UP AND GO: nq Cognitive Function: MMSE - Mini Mental State Exam 10/18/2017 07/26/2016  Not completed: Unable to complete Unable to complete     6CIT Screen 10/24/2018  What Year? (No Data)  What month? (No Data)  What time? (No Data)  Count back from 20 (No Data)  Months in reverse (No Data)  Repeat phrase (No Data)    Immunizations  There is no immunization history on file for this patient.  TDAP status: Due, Education has been provided regarding the importance of this vaccine. Advised may receive this vaccine at local pharmacy or Health Dept. Aware to provide a copy of the vaccination record if obtained from local pharmacy or Health Dept. Verbalized acceptance and understanding. Flu Vaccine status: Declined, Education has been provided regarding the importance of this vaccine but patient still declined. Advised may receive this vaccine at local pharmacy or Health Dept. Aware to provide a copy of the vaccination record if obtained from local pharmacy or Health Dept. Verbalized acceptance and understanding. Pneumococcal vaccine status: Up to date Covid-19 vaccine status: Information provided on how to obtain vaccines.   Qualifies for Shingles Vaccine? Yes    Zostavax completed No   Shingrix Completed?: No.    Education has been provided regarding the importance of this vaccine. Patient has been advised to call insurance company to determine out of pocket expense if they have not yet received this vaccine. Advised may also receive vaccine at local pharmacy or Health Dept. Verbalized acceptance and understanding.  Screening Tests Health Maintenance  Topic Date Due  . FOOT EXAM  Never done  . COVID-19 Vaccine (1) Never done  . TETANUS/TDAP  Never done  . PNA vac Low Risk Adult (1 of 2 - PCV13) Never done  . OPHTHALMOLOGY EXAM  01/24/2018  . HEMOGLOBIN A1C  02/20/2020  . DEXA SCAN  Completed  . INFLUENZA VACCINE  Discontinued    Health Maintenance  Health Maintenance Due  Topic Date Due  .  FOOT EXAM  Never done  . COVID-19 Vaccine (1) Never done  . TETANUS/TDAP  Never done  . PNA vac Low Risk Adult (1 of 2 - PCV13) Never done  . OPHTHALMOLOGY EXAM  01/24/2018    Colorectal cancer screening: No longer required.  Mammogram status: No longer required.  Bone Density status: Completed 2019. Results reflect: Bone density results: OSTEOPOROSIS. Repeat every 2 years. pt is nonambulatory, pt has declined treatment and follow up.   Lung Cancer Screening: (Low Dose CT Chest recommended if Age 48-80 years, 30 pack-year currently smoking OR have quit w/in 15years.) does not qualify.   Lung Cancer Screening Referral: na  Additional Screening:  Hepatitis C Screening: does not qualify  Vision Screening: Recommended annual ophthalmology exams for early detection of glaucoma and other disorders of the eye. Is the patient up to date with their annual eye exam?  No  Who is the provider or what is the name of the office in which the patient attends annual eye exams? [facility] If pt is not established with a provider, would they like to be referred to a provider to establish care? No .   Dental Screening: Recommended annual dental exams for  proper oral hygiene      Plan:     I have personally reviewed and noted the following in the patient's chart:   . Medical and social history . Use of alcohol, tobacco or illicit drugs  . Current medications and supplements . Functional ability and status . Nutritional status . Physical activity . Advanced directives . List of other physicians . Hospitalizations, surgeries, and ER visits in previous 12 months . Vitals . Screenings to include cognitive, depression, and falls . Referrals and appointments  In addition, I have reviewed and discussed with patient certain preventive protocols, quality metrics, and best practice recommendations. A written personalized care plan for preventive services as well as general preventive health recommendations were provided to patient.     [name], NP   11/05/2019

## 2019-11-05 NOTE — Telephone Encounter (Signed)
[name], [name] are scheduled for a virtual visit with your provider today.    Just as we do with appointments in the office, we must obtain your consent to participate.  Your consent will be active for this visit and any virtual visit you may have with one of our providers in the next 365 days.    If you have a MyChart account, I can also send a copy of this consent to you electronically.  All virtual visits are billed to your insurance company just like a traditional visit in the office.  As this is a virtual visit, video technology does not allow for your provider to perform a traditional examination.  This may limit your provider's ability to fully assess your condition.  If your provider identifies any concerns that need to be evaluated in person or the need to arrange testing such as labs, EKG, etc, we will make arrangements to do so.    Although advances in technology are sophisticated, we cannot ensure that it will always work on either your end or our end.  If the connection with a video visit is poor, we may have to switch to a telephone visit.  With either a video or telephone visit, we are not always able to ensure that we have a secure connection.   I need to obtain your verbal consent now.   Are you willing to proceed with your visit today?   Myya Soulliere  (Stephannie Broner patient's daughter) has provided verbal consent on 11/05/2019 for a virtual visit (video or telephone).   Carroll Kinds, CMA 11/05/2019  9:52 AM

## 2019-11-05 NOTE — Patient Instructions (Addendum)
[name] , Thank you for taking time to come for your Medicare Wellness Visit. I appreciate your ongoing commitment to your health goals. Please review the following plan we discussed and let me know if I can assist you in the future.   Screening recommendations/referrals: Colonoscopy aged out Mammogram aged out Bone Density- completed.  Recommended yearly ophthalmology/optometry visit for glaucoma screening and checkup Recommended yearly dental visit for hygiene and checkup  Vaccinations: Influenza vaccine - family declines Pneumococcal vaccine we do not have this on record but family reports up to date-- CAN UPDATE IN OFFICE Tdap vaccine TO GET AT LOCAL PHARMACY Shingles vaccine TO GET AT LOCAL PHARMACY    Advanced directives: on file  Conditions/risks identified: advanced age, progressive memory loss, total care  Next appointment: 1 year   Preventive Care 82 Years and Older, Female Preventive care refers to lifestyle choices and visits with your health care provider that can promote health and wellness. What does preventive care include?  A yearly physical exam. This is also called an annual well check.  Dental exams once or twice a year.  Routine eye exams. Ask your health care provider how often you should have your eyes checked.  Personal lifestyle choices, including:  Daily care of your teeth and gums.  Regular physical activity.  Eating a healthy diet.  Avoiding tobacco and drug use.  Limiting alcohol use.  Practicing safe sex.  Taking low-dose aspirin every day.  Taking vitamin and mineral supplements as recommended by your health care provider. What happens during an annual well check? The services and screenings done by your health care provider during your annual well check will depend on your age, overall health, lifestyle risk factors, and family history of disease. Counseling  Your health care provider may ask you questions about your:  Alcohol  use.  Tobacco use.  Drug use.  Emotional well-being.  Home and relationship well-being.  Sexual activity.  Eating habits.  History of falls.  Memory and ability to understand (cognition).  Work and work Statistician.  Reproductive health. Screening  You may have the following tests or measurements:  Height, weight, and BMI.  Blood pressure.  Lipid and cholesterol levels. These may be checked every 5 years, or more frequently if you are over 28 years old.  Skin check.  Lung cancer screening. You may have this screening every year starting at age 82 if you have a 30-pack-year history of smoking and currently smoke or have quit within the past 15 years.  Fecal occult blood test (FOBT) of the stool. You may have this test every year starting at age 82.  Flexible sigmoidoscopy or colonoscopy. You may have a sigmoidoscopy every 5 years or a colonoscopy every 10 years starting at age 82.  Hepatitis C blood test.  Hepatitis B blood test.  Sexually transmitted disease (STD) testing.  Diabetes screening. This is done by checking your blood sugar (glucose) after you have not eaten for a while (fasting). You may have this done every 1-3 years.  Bone density scan. This is done to screen for osteoporosis. You may have this done starting at age 82.  Mammogram. This may be done every 1-2 years. Talk to your health care provider about how often you should have regular mammograms. Talk with your health care provider about your test results, treatment options, and if necessary, the need for more tests. Vaccines  Your health care provider may recommend certain vaccines, such as:  Influenza vaccine. This is recommended  every year.  Tetanus, diphtheria, and acellular pertussis (Tdap, Td) vaccine. You may need a Td booster every 10 years.  Zoster vaccine. You may need this after age 21.  Pneumococcal 13-valent conjugate (PCV13) vaccine. One dose is recommended after age  23.  Pneumococcal polysaccharide (PPSV23) vaccine. One dose is recommended after age 30. Talk to your health care provider about which screenings and vaccines you need and how often you need them. This information is not intended to replace advice given to you by your health care provider. Make sure you discuss any questions you have with your health care provider. Document Released: 05/22/2015 Document Revised: 01/13/2016 Document Reviewed: 02/24/2015 Elsevier Interactive Patient Education  2017 [place] Prevention in the Home Falls can cause injuries. They can happen to people of all ages. There are many things you can do to make your home safe and to help prevent falls. What can I do on the outside of my home?  Regularly fix the edges of walkways and driveways and fix any cracks.  Remove anything that might make you trip as you walk through a door, such as a raised step or threshold.  Trim any bushes or trees on the path to your home.  Use bright outdoor lighting.  Clear any walking paths of anything that might make someone trip, such as rocks or tools.  Regularly check to see if handrails are loose or broken. Make sure that both sides of any steps have handrails.  Any raised decks and porches should have guardrails on the edges.  Have any leaves, snow, or ice cleared regularly.  Use sand or salt on walking paths during winter.  Clean up any spills in your garage right away. This includes oil or grease spills. What can I do in the bathroom?  Use night lights.  Install grab bars by the toilet and in the tub and shower. Do not use towel bars as grab bars.  Use non-skid mats or decals in the tub or shower.  If you need to sit down in the shower, use a plastic, non-slip stool.  Keep the floor dry. Clean up any water that spills on the floor as soon as it happens.  Remove soap buildup in the tub or shower regularly.  Attach bath mats securely with double-sided  non-slip rug tape.  Do not have throw rugs and other things on the floor that can make you trip. What can I do in the bedroom?  Use night lights.  Make sure that you have a light by your bed that is easy to reach.  Do not use any sheets or blankets that are too big for your bed. They should not hang down onto the floor.  Have a firm chair that has side arms. You can use this for support while you get dressed.  Do not have throw rugs and other things on the floor that can make you trip. What can I do in the kitchen?  Clean up any spills right away.  Avoid walking on wet floors.  Keep items that you use a lot in easy-to-reach places.  If you need to reach something above you, use a strong step stool that has a grab bar.  Keep electrical cords out of the way.  Do not use floor polish or wax that makes floors slippery. If you must use wax, use non-skid floor wax.  Do not have throw rugs and other things on the floor that can make you trip. What  can I do with my stairs?  Do not leave any items on the stairs.  Make sure that there are handrails on both sides of the stairs and use them. Fix handrails that are broken or loose. Make sure that handrails are as long as the stairways.  Check any carpeting to make sure that it is firmly attached to the stairs. Fix any carpet that is loose or worn.  Avoid having throw rugs at the top or bottom of the stairs. If you do have throw rugs, attach them to the floor with carpet tape.  Make sure that you have a light switch at the top of the stairs and the bottom of the stairs. If you do not have them, ask someone to add them for you. What else can I do to help prevent falls?  Wear shoes that:  Do not have high heels.  Have rubber bottoms.  Are comfortable and fit you well.  Are closed at the toe. Do not wear sandals.  If you use a stepladder:  Make sure that it is fully opened. Do not climb a closed stepladder.  Make sure that both  sides of the stepladder are locked into place.  Ask someone to hold it for you, if possible.  Clearly mark and make sure that you can see:  Any grab bars or handrails.  First and last steps.  Where the edge of each step is.  Use tools that help you move around (mobility aids) if they are needed. These include:  Canes.  Walkers.  Scooters.  Crutches.  Turn on the lights when you go into a dark area. Replace any light bulbs as soon as they burn out.  Set up your furniture so you have a clear path. Avoid moving your furniture around.  If any of your floors are uneven, fix them.  If there are any pets around you, be aware of where they are.  Review your medicines with your doctor. Some medicines can make you feel dizzy. This can increase your chance of falling. Ask your doctor what other things that you can do to help prevent falls. This information is not intended to replace advice given to you by your health care provider. Make sure you discuss any questions you have with your health care provider. Document Released: 02/19/2009 Document Revised: 10/01/2015 Document Reviewed: 05/30/2014 Elsevier Interactive Patient Education  2017 [employer].

## 2019-11-07 ENCOUNTER — Ambulatory Visit: Payer: Medicare Other | Attending: Critical Care Medicine

## 2019-11-07 DIAGNOSIS — Z23 Encounter for immunization: Secondary | ICD-10-CM

## 2019-11-07 NOTE — Progress Notes (Signed)
   Covid-19 Vaccination Clinic  Name:  [name]    MRN: [id number] DOB: October 28, 1935  11/07/2019  [name] was observed post Covid-19 immunization for 15 minutes without incident. She was provided with Vaccine Information Sheet and instruction to access the V-Safe system.   [name] was instructed to call 911 with any severe reactions post vaccine: Marland Kitchen Difficulty breathing  . Swelling of face and throat  . A fast heartbeat  . A bad rash all over body  . Dizziness and weakness   Immunizations Administered    Name Date Dose VIS Date Route   Pfizer COVID-19 Vaccine 11/07/2019  1:44 PM 0.3 mL 07/03/2018 Intramuscular   Manufacturer: [place]   Lot: [id number]   [place]: [id number]

## 2019-11-28 ENCOUNTER — Other Ambulatory Visit: Payer: Self-pay

## 2019-11-28 ENCOUNTER — Other Ambulatory Visit: Payer: Medicare Other | Admitting: *Deleted

## 2019-11-28 DIAGNOSIS — Z515 Encounter for palliative care: Secondary | ICD-10-CM

## 2019-12-04 ENCOUNTER — Ambulatory Visit: Payer: Medicare Other | Attending: Critical Care Medicine

## 2019-12-04 ENCOUNTER — Ambulatory Visit: Payer: Medicare Other

## 2019-12-04 DIAGNOSIS — Z23 Encounter for immunization: Secondary | ICD-10-CM

## 2019-12-04 NOTE — Progress Notes (Signed)
   Covid-19 Vaccination Clinic  Name:  [name]    MRN: [id number] DOB: [birth year]  12/04/2019  [name] was observed post Covid-19 immunization for 15 minutes without incident. She was provided with Vaccine Information Sheet and instruction to access the V-Safe system.   [name] was instructed to call 911 with any severe reactions post vaccine: Marland Kitchen Difficulty breathing  . Swelling of face and throat  . A fast heartbeat  . A bad rash all over body  . Dizziness and weakness   Immunizations Administered    Name Date Dose VIS Date Route   Pfizer COVID-19 Vaccine 12/04/2019 12:03 PM 0.3 mL 07/03/2018 Intramuscular   Manufacturer: [place]   Lot: [id number]   [place]: [id number]

## 2019-12-05 ENCOUNTER — Ambulatory Visit: Payer: Medicare Other

## 2019-12-09 NOTE — Progress Notes (Signed)
COMMUNITY PALLIATIVE CARE RN NOTE  PATIENT NAME: [name] DOB: Jan 12, 1936 MRN: [id number]  PRIMARY CARE PROVIDER: [name], NP  RESPONSIBLE PARTY:  Acct ID - Guarantor Home Phone Work Phone Relationship Acct Type  [id number] JAYSHA, [phone]  Self P/F     [address], [place], [place] [address]   Due to the COVID-19 crisis, this virtual check-in visit was done via telephone from my office and it was initiated and consent by this patient and or family.  PLAN OF CARE and INTERVENTION:  1. ADVANCE CARE PLANNING/GOALS OF CARE: Goal is for patient to remain at home with her family and avoid hospitalizations. She has a DNR. 2. PATIENT/CAREGIVER EDUCATION: Symptom management, safe transfers, skin breakdown prevention/maintenance 3. DISEASE STATUS: Virtual check in visit completed via telephone. Patient currently sitting up in her wheelchair awake and alert to self, just finishing her breakfast. She denies pain. [name], [name], states that patient has received her 1st in-home Covid-19 vaccine without any ill effects. Her recent issue has been with constipation. She went over a week without having a bowel movement. She did have a large BM yesterday, with some hemorrhoidal bleeding noted. This has subsided. She is paying more attention to what she eats and recording of bowel movements. She continues with some fluctuations in her BP, but is managed with Losartan and Clonidine. Her intake has been good and blood sugars below 150. No recent reports of coughing or choking during meals. She has occasional occurrences of runny nose and eyes. [name] attributes this to possible allergies. She is afebrile. She is total care with all ADLs. She is incontinent of both bowel and bladder, but the areas to her bottom remains closed. She has been having issues with a small bump located on the left side of her ear with some crusted drainage. They have been keeping her hair off of this area,  keeping it clean and applying ointment to help. It does not seem to irritate her. She continues with edema to bilateral lower extremities, but this is stable. Will continue to monitor.  HISTORY OF PRESENT ILLNESS: This is a 84 yo female with a diagnosis of dementia. She lives in the home with her son and daughter in law. Palliative care team continues to follow patient. Will continue to visit monthly and PRN.     CODE STATUS: DNR ADVANCED DIRECTIVES: Y MOST FORM: no PPS: 30%   (Duration of visit and documentation 45 minutes)   Daryl Eastern, RN BSN

## 2019-12-27 ENCOUNTER — Other Ambulatory Visit: Payer: Self-pay | Admitting: *Deleted

## 2019-12-27 ENCOUNTER — Other Ambulatory Visit: Payer: Self-pay | Admitting: Nurse Practitioner

## 2019-12-27 DIAGNOSIS — I10 Essential (primary) hypertension: Secondary | ICD-10-CM

## 2019-12-27 DIAGNOSIS — E1142 Type 2 diabetes mellitus with diabetic polyneuropathy: Secondary | ICD-10-CM

## 2019-12-27 DIAGNOSIS — Z86718 Personal history of other venous thrombosis and embolism: Secondary | ICD-10-CM

## 2019-12-27 MED ORDER — CLONIDINE HCL 0.1 MG PO TABS: 0.1000 mg | ORAL_TABLET | Freq: Two times a day (BID) | ORAL | 0 refills | Status: DC

## 2019-12-27 NOTE — Telephone Encounter (Signed)
[name] called requested a short supply to be sent to local pharmacy because they have not received ChamVa yet and she is going to run out this weekend. Faxed to pharmacy at request.

## 2020-01-07 ENCOUNTER — Other Ambulatory Visit: Payer: Self-pay

## 2020-01-07 ENCOUNTER — Other Ambulatory Visit: Payer: Medicare Other | Admitting: *Deleted

## 2020-01-07 DIAGNOSIS — Z515 Encounter for palliative care: Secondary | ICD-10-CM

## 2020-01-14 NOTE — Progress Notes (Signed)
COMMUNITY PALLIATIVE CARE RN NOTE  PATIENT NAME: [name] DOB: 1935-06-10 MRN: [id number]  PRIMARY CARE PROVIDER: [name], NP  RESPONSIBLE PARTY:  Acct ID - Guarantor Home Phone Work Phone Relationship Acct Type  [id number] MECHELE, [phone]  Self P/F     [address] [place], [place] [address]   Due to the COVID-19 crisis, this virtual check-in visit was done via telephone from my office and it was initiated and consent by this patient and or family.  PLAN OF CARE and INTERVENTION:  1. ADVANCE CARE PLANNING/GOALS OF CARE: Goal is for patient to remain in the home with her son and daughter-in-law. She has a DNR. 2. PATIENT/CAREGIVER EDUCATION: Symptom management, safe transfers, s/s of infection 3. DISEASE STATUS: Virtual check-in visit completed via telephone. Patient denies pain. She is currently sitting up in her wheelchair being fed her breakfast. She remains total care for all ADLs. She is transferred via 1-2 person assistance and transported via wheelchair. She continues to eat 3 meals/day. No issues with coughing during meals recently. Her blood sugars are usually below 150. Her blood pressure continues to fluctuate but are controlled with Losartan and Clonidine. She is incontinent of both bowel and bladder. Barrier cream is applied after each incontinent episode. She has small areas on her bottom that will open up on occasion, but this is monitored closely. Her bowel movements have been more regular. She has no complaints at this time. Will continue to monitor.   HISTORY OF PRESENT ILLNESS:  This is a 84 yo female with a diagnosis of dementia without behavioral disturbances. Palliative care team continues to follow patient and visits monthly and PRN.  CODE STATUS: DNR ADVANCED DIRECTIVES: Y MOST FORM: no PPS: 30%   (Duration of visit and documentation 30 minutes)   Daryl Eastern, RN BSN

## 2020-01-30 ENCOUNTER — Other Ambulatory Visit: Payer: Medicare Other | Admitting: *Deleted

## 2020-01-30 ENCOUNTER — Other Ambulatory Visit: Payer: Self-pay

## 2020-01-30 DIAGNOSIS — Z515 Encounter for palliative care: Secondary | ICD-10-CM

## 2020-02-03 ENCOUNTER — Other Ambulatory Visit: Payer: Self-pay | Admitting: *Deleted

## 2020-02-03 DIAGNOSIS — E1142 Type 2 diabetes mellitus with diabetic polyneuropathy: Secondary | ICD-10-CM

## 2020-02-03 DIAGNOSIS — E785 Hyperlipidemia, unspecified: Secondary | ICD-10-CM

## 2020-02-03 DIAGNOSIS — I10 Essential (primary) hypertension: Secondary | ICD-10-CM

## 2020-02-03 MED ORDER — GABAPENTIN 300 MG PO CAPS: 300.0000 mg | ORAL_CAPSULE | Freq: Two times a day (BID) | ORAL | 1 refills | Status: DC

## 2020-02-03 MED ORDER — CLONIDINE HCL 0.1 MG PO TABS: 0.1000 mg | ORAL_TABLET | Freq: Two times a day (BID) | ORAL | 1 refills | Status: DC

## 2020-02-03 MED ORDER — ATORVASTATIN CALCIUM 40 MG PO TABS: 40.0000 mg | ORAL_TABLET | Freq: Every day | ORAL | 1 refills | Status: DC

## 2020-02-03 NOTE — Telephone Encounter (Signed)
Patient caregiver requested 90 day supply Rx's to be faxed to [facility]

## 2020-02-07 NOTE — Progress Notes (Signed)
COMMUNITY PALLIATIVE CARE RN NOTE  PATIENT NAME: [name] DOB: 03-28-1936 MRN: [id number]  PRIMARY CARE PROVIDER: [name], NP  RESPONSIBLE PARTY:  Acct ID - Guarantor Home Phone Work Phone Relationship Acct Type  [id number] SIBLEY, [phone]  Self P/F     [address] [place], [place] [address]   Due to the COVID-19 crisis, this virtual check-in visit was done via telephone from my office and it was initiated and consent by this patient and or family.  PLAN OF CARE and INTERVENTION:  1. ADVANCE CARE PLANNING/GOALS OF CARE: Goal is for patient to remain in the home with her son/daughter-in-law. She has a DNR. 2. PATIENT/CAREGIVER EDUCATION: Symptom management, safe transfers, management of skin breakdown, s/s of infection 3. DISEASE STATUS: Virtual check-in visit completed via telephone. Patient denies pain at this time. She is awake and alert and remains able to answer simple questions with short replies. Breathing is regular and unlabored per daughter in law, [name]. Patient is currently sitting up in her wheelchair. She continues to require total care for all ADLs. She is incontinent of both bowel and bladder and wears adult briefs. Her appetite has been good overall. She denies any recent issues with dysphagia or coughing during meals. Her fasting blood sugars have been below 150. She continues on oral hypoglycemics twice daily. She continues to have elevated blood pressures in the am, but is well controlled throughout the day with Losartan and Clonidine. She has not had a BM in about 4-5 days. She has started giving patient prune juice, which is usually effective. She says this seems to work better than Miralax. She does not have any open areas currently noted to her bottom. Occasionally, the skin will open up and they will cover with a bandage. Otherwise they apply barrier cream after each incontinent episode.  She continues with some dry, flaky skin behind her left  ear. Patient often lays on this side. They make sure they keep this area clean and dry and keeps her hair up to prevent the oils from her hair from irritating it. She continues with hired caregivers daily from 9a-8p. Will continue to monitor.  HISTORY OF PRESENT ILLNESS: This is a 84 yo female with a diagnosis of dementia without behavioral disturbances. Palliative care team continues to follow patient and visits monthly and PRN.   CODE STATUS: DNR ADVANCED DIRECTIVES: Y MOST FORM: no PPS: 30%   (Duration of visit and documentation 30 minutes)   Daryl Eastern, RN BSN

## 2020-02-24 ENCOUNTER — Telehealth: Payer: Self-pay

## 2020-02-24 NOTE — Telephone Encounter (Signed)
She will not need to fasting, we will get lab work tomorrow.

## 2020-02-24 NOTE — Telephone Encounter (Signed)
Patient has an in office appt on Wednesday. She has not been seen in the office since April.  Daughter is asking if patient will have any labs done that she will need to be fasting for, or if visit can be virtual. There are no labs scheduled and your last note did not mention future labs. It looks like she is being followed by Palliative Care.

## 2020-02-24 NOTE — Telephone Encounter (Signed)
Called daughter, [name], and let her know.

## 2020-02-26 ENCOUNTER — Other Ambulatory Visit: Payer: Self-pay

## 2020-02-26 ENCOUNTER — Ambulatory Visit (INDEPENDENT_AMBULATORY_CARE_PROVIDER_SITE_OTHER): Payer: Medicare Other | Admitting: Nurse Practitioner

## 2020-02-26 ENCOUNTER — Encounter: Payer: Self-pay | Admitting: Nurse Practitioner

## 2020-02-26 VITALS — BP 144/78 | HR 82 | Temp 97.1°F

## 2020-02-26 DIAGNOSIS — E1142 Type 2 diabetes mellitus with diabetic polyneuropathy: Secondary | ICD-10-CM | POA: Diagnosis not present

## 2020-02-26 DIAGNOSIS — L8990 Pressure ulcer of unspecified site, unspecified stage: Secondary | ICD-10-CM | POA: Diagnosis not present

## 2020-02-26 DIAGNOSIS — F0390 Unspecified dementia without behavioral disturbance: Secondary | ICD-10-CM | POA: Diagnosis not present

## 2020-02-26 DIAGNOSIS — E785 Hyperlipidemia, unspecified: Secondary | ICD-10-CM

## 2020-02-26 DIAGNOSIS — Z86718 Personal history of other venous thrombosis and embolism: Secondary | ICD-10-CM | POA: Diagnosis not present

## 2020-02-26 DIAGNOSIS — E1169 Type 2 diabetes mellitus with other specified complication: Secondary | ICD-10-CM

## 2020-02-26 DIAGNOSIS — Z8616 Personal history of COVID-19: Secondary | ICD-10-CM

## 2020-02-26 DIAGNOSIS — I10 Essential (primary) hypertension: Secondary | ICD-10-CM | POA: Diagnosis not present

## 2020-02-26 NOTE — Progress Notes (Signed)
Careteam: Patient Care Team: [name], NP as PCP - General (Geriatric Medicine) [name], MD as Consulting Physician (Oncology) [name], [name], LCSW as Social Worker (Licensed Clinical Social Worker) [name], RN as Registered Nurse (Hospice and Palliative Medicine)  PLACE OF SERVICE:  [facility] Directive information Does Patient Have a Medical Advance Directive?: Yes, Type of Advance Directive: Healthcare Power of [place];Out of facility DNR (pink MOST or yellow form), Pre-existing out of facility DNR order (yellow form or pink MOST form): Yellow form placed in chart (order not valid for inpatient use);Pink MOST form placed in chart (order not valid for inpatient use), Does patient want to make changes to medical advance directive?: No - Patient declined  No Known Allergies  Chief Complaint  Patient presents with   Medical Management of Chronic Issues    Routine follow-up visit. Foot exam due today. Discuss need for PNA, TD, and eye exam. Here with [name], daughter-in-law.     HPI: Patient is a 84 y.o. female for routine follow up.   [name] feels like she has had pneumonia vaccine in the past, not in [employer] Does not want flu vaccine.   DM- blood sugars are "good" per [name], typically 120s fasting.   htn- controlled on losartan 75 mg and clonidine 0.1 mg. bloodpressure "low" today compared to usual. She is more alert when blood pressure is higher.   Hyperlipidemia- continues on lipitor, not fasting today.   Constipation- stopped miralax bc she was doing well but feels like she may restart due to large bm every 6 days. Some discomfort with last BM    Review of Systems:  Review of Systems  Unable to perform ROS: Dementia    Past Medical History:  Diagnosis Date   Dementia ([place])    Diabetes mellitus type II 03/21/2011   DVT (deep venous thrombosis) ([place]) 03/21/2011   Hyperlipidemia 03/21/2011   Hypertension 03/21/2011    Lymphedema of leg 03/21/2011   Pulmonary embolism ([place]) 03/21/2011   Stroke [facility])    Past Surgical History:  Procedure Laterality Date   CHOLECYSTECTOMY  2015   SKIN TAG REMOVAL  07/25/2016   VAGINAL HYSTERECTOMY     unknown of date   VASCULAR SURGERY     Social History:   reports that she has never smoked. She has never used smokeless tobacco. She reports that she does not drink alcohol and does not use drugs.  Family History  Problem Relation Age of Onset   Heart attack Mother    COPD Sister    Stroke Sister    Bipolar disorder Daughter     Medications: Patient's Medications  New Prescriptions   No medications on file  Previous Medications   A&D OINT    Apply 1 application topically as needed.    ACCU-CHEK AVIVA PLUS TEST STRIP    USE STRIP(S) TO TEST TWICE A DAY - (KEEP UNUSED STRIPS IN ORIGINAL SEALED CONTAINER BETWEEN USES)   ACCU-CHEK SOFTCLIX LANCETS LANCETS    1 each by Other route 2 (two) times daily. Use as instructed Dx: E11.9   ACETAMINOPHEN (TYLENOL) 500 MG TABLET    Take 500 mg by mouth every 8 (eight) hours as needed (for general pain).    ATORVASTATIN (LIPITOR) 40 MG TABLET    Take 1 tablet (40 mg total) by mouth daily at 6 PM.   CHOLECALCIFEROL (VITAMIN D-3) 125 MCG (5000 UT) TABS    Take 5,000 Units by mouth  daily.   CLONIDINE (CATAPRES) 0.1 MG TABLET    Take 1 tablet (0.1 mg total) by mouth 2 (two) times daily. May take an additional tablet if needed SBP >170   COZAAR 50 MG TABLET    TAKE ONE TABLET BY MOUTH EVERY DAY ALONG WITH A 25MG  TABLET TO EQUAL 75MG  TOTAL   DICLOFENAC SODIUM (VOLTAREN) 1 % GEL    Apply 1 application topically 2 (two) times daily as needed (pain).    GABAPENTIN (NEURONTIN) 300 MG CAPSULE    Take 1 capsule (300 mg total) by mouth 2 (two) times daily.   LOSARTAN (COZAAR) 25 MG TABLET    TAKE ONE TABLET BY MOUTH EVERY DAY - TAKE ALONG WITH 50 MG TO EQUAL 75 MG   NON FORMULARY    Take 1 capsule by mouth See admin instructions.  Metagenics - UltraFlora Balance Daily Probiotic Immune Support* capsules- Take 1 capsule by mouth once a day   POLYETHYLENE GLYCOL (MIRALAX / GLYCOLAX) PACKET    Take 17 g by mouth daily as needed for mild constipation (MIX AND DRINK).    XARELTO 15 MG TABS TABLET    TAKE ONE TABLET BY MOUTH EVERY DAY WITH SUPPER  Modified Medications   Modified Medication Previous Medication   METFORMIN (GLUCOPHAGE) 500 MG TABLET metFORMIN (GLUCOPHAGE) 500 MG tablet      1.5 tablets twice daily with meals.    TAKE ONE AND ONE-HALF TABLET(S) BY MOUTH TWICE A DAY WITH FOOD  Discontinued Medications   WOUND DRESSINGS (MEPILEX EX)    Apply 1 patch topically See admin instructions. Apply to sacrum and change one to two times daily as needed    Physical Exam:  Vitals:   02/26/20 1109  BP: (!) 144/78  Pulse: 82  Temp: (!) 97.1 F (36.2 C)  TempSrc: Temporal   There is no height or weight on file to calculate BMI. Wt Readings from Last 3 Encounters:  05/04/19 130 lb 1.1 oz (59 kg)  11/02/18 130 lb (59 kg)  01/30/18 130 lb (59 kg)    Physical Exam Constitutional:      General: She is not in acute distress.    Appearance: She is well-developed. She is not diaphoretic.  HENT:     Head: Normocephalic and atraumatic.     Mouth/Throat:     Pharynx: No oropharyngeal exudate.  Eyes:     Conjunctiva/sclera: Conjunctivae normal.     Pupils: Pupils are equal, round, and reactive to light.  Cardiovascular:     Rate and Rhythm: Normal rate and regular rhythm.     Pulses:          Dorsalis pedis pulses are 1+ on the right side and 1+ on the left side.       Posterior tibial pulses are 1+ on the right side and 1+ on the left side.     Heart sounds: Normal heart sounds.  Pulmonary:     Effort: Pulmonary effort is normal.     Breath sounds: Normal breath sounds.  Abdominal:     General: Bowel sounds are normal.     Palpations: Abdomen is soft.  Musculoskeletal:        General: No tenderness.      Cervical back: Normal range of motion and neck supple.     Right lower leg: Edema (2+) present.     Left lower leg: Edema (2+) present.  Feet:     Right foot:     Protective Sensation: 4 sites  tested. 0 sites sensed.     Skin integrity: Skin integrity normal.     Toenail Condition: Right toenails are normal.     Left foot:     Protective Sensation: 4 sites tested. 0 sites sensed.     Skin integrity: Skin integrity normal.     Toenail Condition: Left toenails are normal.  Skin:    General: Skin is warm and dry.  Neurological:     Mental Status: She is alert.     Sensory: Sensory deficit present.     Motor: Weakness present.     Labs reviewed: Basic Metabolic Panel: Recent Labs    05/04/19 0035 05/04/19 0704 05/05/19 0623 08/21/19 1101  NA 134*  --  138 138  K 4.5  --  4.8 4.6  CL 97*  --  101 101  CO2 25  --  26 25  GLUCOSE 311*  --  132* 145*  BUN 17  --  10 17  CREATININE 0.57  --  0.37* 0.46*  CALCIUM 9.2  --  9.5 9.3  TSH  --  2.756  --   --    Liver Function Tests: Recent Labs    04/26/19 1843 08/21/19 1101  AST 18 12  ALT 14 11  ALKPHOS 66  --   BILITOT 0.5 0.4  PROT 6.6 6.8  ALBUMIN 3.6  --    No results for input(s): LIPASE, AMYLASE in the last 8760 hours. No results for input(s): AMMONIA in the last 8760 hours. CBC: Recent Labs    04/26/19 1843 04/26/19 1843 05/04/19 0035 05/05/19 0929 08/21/19 1101  WBC 4.0   < > 2.5* 2.8* 3.2*  NEUTROABS 2.1  --  1.2*  --  1,443*  HGB 11.6*   < > 11.5* 13.2 11.4*  HCT 38.1   < > 36.4 42.2 35.3  MCV 94.3   < > 92.4 92.5 91.0  PLT 174   < > 173 204 230   < > = values in this interval not displayed.   Lipid Panel: Recent Labs    08/21/19 1101  CHOL 175  HDL 47*  LDLCALC 112*  TRIG 74  CHOLHDL 3.7   TSH: Recent Labs    05/04/19 0704  TSH 2.756   A1C: Lab Results  Component Value Date   HGBA1C 8.7 (H) 08/21/2019     Assessment/Plan 1. History of DVT (deep vein thrombosis) -continues on  xarelto, no signs of bleeding noted.  - CBC with Differential/Platelet  2. Type 2 diabetes mellitus with diabetic polyneuropathy, without long-term current use of insulin (HCC) -reports blood sugars have improved,  - Hemoglobin A1c  3. Hyperlipidemia associated with type 2 diabetes mellitus (HCC) Continues on lipitor.  - Lipid Panel - COMPLETE METABOLIC PANEL WITH GFR  4. Pressure injury of skin, unspecified injury stage, unspecified location Improved at this time, family continues to change positions frequently and use barrier cream.  5. Dementia without behavioral disturbance, unspecified dementia type ([place]) -advanced, family provides total care.  6. Essential hypertension Stable at this time. Will continue current regimen.   7. History of COVID-19 - SARS CoV2 Serology(COVID19) AB(IgG,IgM),Immunoassay  Next appt: 4 months.  Carlos American. [place], [facility] [phone]

## 2020-02-27 ENCOUNTER — Other Ambulatory Visit: Payer: Medicare Other | Admitting: *Deleted

## 2020-02-27 DIAGNOSIS — Z515 Encounter for palliative care: Secondary | ICD-10-CM

## 2020-02-27 LAB — COMPLETE METABOLIC PANEL WITH GFR
AG Ratio: 1.2 (calc) (ref 1.0–2.5)
ALT: 18 U/L (ref 6–29)
AST: 17 U/L (ref 10–35)
Albumin: 3.8 g/dL (ref 3.6–5.1)
Alkaline phosphatase (APISO): 80 U/L (ref 37–153)
BUN/Creatinine Ratio: 42 (calc) — ABNORMAL HIGH (ref 6–22)
BUN: 26 mg/dL — ABNORMAL HIGH (ref 7–25)
CO2: 27 mmol/L (ref 20–32)
Calcium: 9.8 mg/dL (ref 8.6–10.4)
Chloride: 100 mmol/L (ref 98–110)
Creat: 0.62 mg/dL (ref 0.60–0.88)
GFR, Est African American: 97 mL/min/{1.73_m2} (ref >=60)
GFR, Est Non African American: 84 mL/min/{1.73_m2} (ref >=60)
Globulin: 3.2 g/dL (calc) (ref 1.9–3.7)
Glucose, Bld: 209 mg/dL — ABNORMAL HIGH (ref 65–139)
Potassium: 4.9 mmol/L (ref 3.5–5.3)
Sodium: 139 mmol/L (ref 135–146)
Total Bilirubin: 0.4 mg/dL (ref 0.2–1.2)
Total Protein: 7 g/dL (ref 6.1–8.1)

## 2020-02-27 LAB — CBC WITH DIFFERENTIAL/PLATELET
Absolute Monocytes: 336 cells/uL (ref 200–950)
Basophils Absolute: 20 cells/uL (ref 0–200)
Basophils Relative: 0.5 %
Eosinophils Absolute: 20 cells/uL (ref 15–500)
Eosinophils Relative: 0.5 %
HCT: 35.4 % (ref 35.0–45.0)
Hemoglobin: 11.2 g/dL — ABNORMAL LOW (ref 11.7–15.5)
Lymphs Abs: 1712 cells/uL (ref 850–3900)
MCH: 28.9 pg (ref 27.0–33.0)
MCHC: 31.6 g/dL — ABNORMAL LOW (ref 32.0–36.0)
MCV: 91.2 fL (ref 80.0–100.0)
MPV: 9.1 fL (ref 7.5–12.5)
Monocytes Relative: 8.4 %
Neutro Abs: 1912 cells/uL (ref 1500–7800)
Neutrophils Relative %: 47.8 %
Platelets: 230 10*3/uL (ref 140–400)
RBC: 3.88 10*6/uL (ref 3.80–5.10)
RDW: 12 % (ref 11.0–15.0)
Total Lymphocyte: 42.8 %
WBC: 4 10*3/uL (ref 3.8–10.8)

## 2020-02-27 LAB — HEMOGLOBIN A1C
Hgb A1c MFr Bld: 7.8 % of total Hgb — ABNORMAL HIGH (ref <5.7)
Mean Plasma Glucose: 177 (calc)
eAG (mmol/L): 9.8 (calc)

## 2020-02-27 LAB — LIPID PANEL
Cholesterol: 171 mg/dL (ref <200)
HDL: 49 mg/dL — ABNORMAL LOW (ref >=50)
LDL Cholesterol (Calc): 103 mg/dL (calc) — ABNORMAL HIGH
Non-HDL Cholesterol (Calc): 122 mg/dL (calc) (ref <130)
Total CHOL/HDL Ratio: 3.5 (calc) (ref <5.0)
Triglycerides: 101 mg/dL (ref <150)

## 2020-02-27 LAB — SARS COV-2 SEROLOGY(COVID-19)AB(IGG,IGM),IMMUNOASSAY
SARS CoV-2 AB IgG: POSITIVE — AB
SARS CoV-2 IgM: POSITIVE — AB

## 2020-02-27 MED ORDER — METFORMIN HCL 500 MG PO TABS: ORAL_TABLET | ORAL | 1 refills | Status: DC

## 2020-03-02 NOTE — Progress Notes (Signed)
COMMUNITY PALLIATIVE CARE RN NOTE  PATIENT NAME: [name] DOB: May 29, 1935 MRN: [id number]  PRIMARY CARE PROVIDER: [name], NP  RESPONSIBLE PARTY:  Acct ID - Guarantor Home Phone Work Phone Relationship Acct Type  [id number] LAURALYN, [phone]  Self P/F     [address] [place], [place] [address]   Due to the COVID-19 crisis, this virtual check-in visit was done via telephone from my office and it was initiated and consent by this patient and or family.   PLAN OF CARE and INTERVENTION:  1. ADVANCE CARE PLANNING/GOALS OF CARE: Goal is for patient to remain in the home with her family. She has a DNR. 2. PATIENT/CAREGIVER EDUCATION: Symptom management, bowel management, safe transfers, maintenance/prevention of skin breakdown 3. DISEASE STATUS: Virtual check-in visit completed via telephone. Patient denies pain at this time. No breathing issues reported. She is alert and oriented to self. Intermittently confused. She is able to answer some simple questions and follow commands. She had a visit with her PCP yesterday for routine follow-up and lab work. No changes made to plan of care. She will have another follow-up in 4 months. [name] states that her blood pressures continue to fluctuate. They are usually higher in the mornings. This morning it was 190/99. She continues to manage her pressures with Losartan and Clonidine. She also states that last evening, her blood sugar was 198. She was given 1 1/2 tabs of Metformin and this morning her CBG was 42. Patient was still chatty, and in a good mood. CBG was repeated and still read the same. She then ate some breakfast and went back to sleep. Her intake is normal. She denies any recent issues with dysphagia. She has not had a BM in 5-6 days. She says that she usually gives her prune juice and this is effective. She will try this first and if unsuccessful, she will give patient Miralax. Her skin is currently intact on her bottom. They  continue to utilize barrier cream. The area behind her ear has been doing well. Crusted drainage sometimes noted, but patient tends to lie on this side during the night. She keeps the area clean and dry, and tries to keep her hair off of it to prevent irritation. She remains total care for ADLs. She has hired caregivers during the day to assist with her personal care needs. Will continue to monitor.   HISTORY OF PRESENT ILLNESS:  This is a 85 yo female with a diagnosis of dementia without behavioral disturbances. Palliative care team continues to follow patient and visits monthly and PRN.   CODE STATUS: DNR ADVANCED DIRECTIVES: Y MOST FORM: no PPS: 30%   (Duration of visit and documentation 45 minutes)   Daryl Eastern, RN BSN

## 2020-03-06 ENCOUNTER — Other Ambulatory Visit: Payer: Self-pay

## 2020-03-06 DIAGNOSIS — E1142 Type 2 diabetes mellitus with diabetic polyneuropathy: Secondary | ICD-10-CM

## 2020-03-06 MED ORDER — METFORMIN HCL 500 MG PO TABS: ORAL_TABLET | ORAL | 0 refills | Status: DC

## 2020-03-06 NOTE — Telephone Encounter (Signed)
[name] called and stated that she ordered her mom metformin and it will not come for another week she asked if we could order 10 pills to get her through until her 62 supply come in. 10 pills sent to [facility].

## 2020-03-09 ENCOUNTER — Other Ambulatory Visit: Payer: Self-pay | Admitting: *Deleted

## 2020-03-09 DIAGNOSIS — E1142 Type 2 diabetes mellitus with diabetic polyneuropathy: Secondary | ICD-10-CM

## 2020-03-09 MED ORDER — METFORMIN HCL 500 MG PO TABS: ORAL_TABLET | ORAL | 0 refills | Status: DC

## 2020-03-09 NOTE — Telephone Encounter (Signed)
Patient needs extra sent to local pharmacy until she can received her Rx from [facility].

## 2020-03-23 ENCOUNTER — Other Ambulatory Visit: Payer: Self-pay | Admitting: *Deleted

## 2020-03-23 ENCOUNTER — Other Ambulatory Visit: Payer: Medicare Other

## 2020-03-23 ENCOUNTER — Other Ambulatory Visit: Payer: Self-pay

## 2020-03-23 DIAGNOSIS — I10 Essential (primary) hypertension: Secondary | ICD-10-CM

## 2020-03-23 DIAGNOSIS — Z86718 Personal history of other venous thrombosis and embolism: Secondary | ICD-10-CM

## 2020-03-23 DIAGNOSIS — Z515 Encounter for palliative care: Secondary | ICD-10-CM

## 2020-03-23 MED ORDER — LOSARTAN POTASSIUM 25 MG PO TABS: ORAL_TABLET | ORAL | 1 refills | Status: DC

## 2020-03-23 MED ORDER — RIVAROXABAN 15 MG PO TABS: ORAL_TABLET | ORAL | 1 refills | Status: DC

## 2020-03-23 MED ORDER — LOSARTAN POTASSIUM 50 MG PO TABS: ORAL_TABLET | ORAL | 1 refills | Status: DC

## 2020-03-23 NOTE — Progress Notes (Signed)
COMMUNITY PALLIATIVE CARE SW NOTE  PATIENT NAME: [name] DOB: 1936/02/27 MRN: [id number]  PRIMARY CARE PROVIDER: [name], NP  RESPONSIBLE PARTY:  Acct ID - Guarantor Home Phone Work Phone Relationship Acct Type  [id number] ADALIE, [phone]  Self P/F     [place], [place], [place] [address]     PLAN OF CARE and INTERVENTIONS:             1. GOALS OF CARE/ ADVANCE CARE PLANNING:  Goal is for patient to remain at home. Patient is a DNR.  2. SOCIAL/EMOTIONAL/SPIRITUAL ASSESSMENT/ INTERVENTIONS:  SW completed telephonic visit with patient's daughter-in-law-[name]. [name] reported that patient is doing well overall. Patient's appetite is good. She is having a flare up as she has red area on her bottom. [name] stated that they are keeping the area dry, covered and with ointment. Patient is sleeping well. No weight loss noted. No falls. [name] report that she is coping well. She continue to have private aides to come in and help with patient's personal care. SW reinforced access to palliative care team. SW provided active listening, assessment of needs and comfort of patient, coping and needs of PCG. SW will provide ongoing psychosocial assessment of needs and provide support.  3. PATIENT/CAREGIVER EDUCATION/ COPING: Patient and  PCG is coping well. 4. PERSONAL EMERGENCY PLAN:  911 can be activated for emergencies.  5. COMMUNITY RESOURCES COORDINATION/ HEALTH CARE NAVIGATION:  Patient receiving private caregiver support for 11 hours a day. 6. FINANCIAL/LEGAL CONCERNS/INTERVENTIONS:  None.     SOCIAL HX:  Social History   Tobacco Use  . Smoking status: Never Smoker  . Smokeless tobacco: Never Used  Substance Use Topics  . Alcohol use: No    CODE STATUS: DNR ADVANCED DIRECTIVES: No MOST FORM COMPLETE:  Yes HOSPICE EDUCATION PROVIDED: No  PPS: Patient continues to rely on family and paid caregivers for ADL's.    Duration of telephonic visit and  documentation: [address] LCSW

## 2020-03-23 NOTE — Telephone Encounter (Signed)
[name], Caregiver requested refills.  Pended Rx's and sent to [facility] for approval due to [place].

## 2020-04-10 NOTE — Telephone Encounter (Signed)
error 

## 2020-04-23 ENCOUNTER — Other Ambulatory Visit: Payer: Self-pay

## 2020-04-23 ENCOUNTER — Other Ambulatory Visit: Payer: Medicare Other | Admitting: *Deleted

## 2020-04-23 DIAGNOSIS — Z515 Encounter for palliative care: Secondary | ICD-10-CM

## 2020-04-29 ENCOUNTER — Other Ambulatory Visit: Payer: Self-pay | Admitting: Nurse Practitioner

## 2020-04-29 ENCOUNTER — Telehealth: Payer: Self-pay

## 2020-04-29 DIAGNOSIS — E1142 Type 2 diabetes mellitus with diabetic polyneuropathy: Secondary | ICD-10-CM

## 2020-04-29 MED ORDER — METFORMIN HCL ER (OSM) 1000 MG PO TB24
1000.0000 mg | ORAL_TABLET | Freq: Every day | ORAL | 0 refills | Status: DC
Start: 2020-04-29 — End: 2020-05-19

## 2020-04-29 MED ORDER — METFORMIN HCL ER (OSM) 1000 MG PO TB24
1000.0000 mg | ORAL_TABLET | Freq: Every day | ORAL | 1 refills | Status: DC
Start: 2020-04-29 — End: 2020-04-29

## 2020-04-29 NOTE — Telephone Encounter (Signed)
Below is the routing comment (not a permanent  part of patients chart)  sent by Dinah Ngetich.   Blood sugar look stable.recommend a low carbohydrates,sweets,cookies,sugary foods/drinks.  May reduce metformin to 1000 mg tablet daily.  Notify provider if blood sugars are greater than 200   Routing comment    Discussed response with [name]. RX sent to [facility] for 90 day supply and a 15 day supply to [facility] (to hold patinet over until mail order arrives), per request. I stressed the importance of taking medication as prescribed and consulting with a provider before making medication changes.

## 2020-04-29 NOTE — Telephone Encounter (Signed)
Incoming call received from patients daughter in law. [name] is requesting to change the dose of metformin back to 1000 mg.  Patient was on 1000 mg then the dose was decreased to 500 mg and now [name] would like to increase back to 1000 mg to avoid having to give 2 pills of the 500 mg tablet.   If provider is ok with change, [name] needs a short-term supply sent to [facility] ([facility]) and a long-term supply sent to [facility].   Please advise

## 2020-04-29 NOTE — Telephone Encounter (Signed)
Spoke with [name], patient takes 1-2 tablets twice daily (DIFFERENT FROM MEDICATION LIST)  If reading is below 120 will take 500 mg bid, if higher than 150 patient will take 2, 500 mg tablets (1000 mg twice daily)  109 125 121 106 104 146 139 185 highest   Family tweaks dosage without authorization of a physician.

## 2020-04-29 NOTE — Telephone Encounter (Signed)
Medication list reviewed supposed to take Metformin 500 mg tablet 1.5 tablet ( 750 mg) twice daily ( total of 1500 mg) per day. What is the lowest and the highest blood sugar level when you check your blood sugars?

## 2020-05-18 NOTE — Progress Notes (Signed)
COMMUNITY PALLIATIVE CARE RN NOTE  PATIENT NAME: [name] DOB: 1935-08-28 MRN: [id number]  PRIMARY CARE PROVIDER: [name], NP  RESPONSIBLE PARTY:  Acct ID - Guarantor Home Phone Work Phone Relationship Acct Type  [id number] SHAKEIRA, [phone]  Self P/F     [address] [place], [place] [address]   Due to the COVID-19 crisis, this virtual check-in visit was done via telephone from my office and it was initiated and consent by this patient and or family.  PLAN OF CARE and INTERVENTION:  1. ADVANCE CARE PLANNING/GOALS OF CARE: Goal is for patient to remain at home with her son and daughter-in-law and avoid hospitalizations. She has a DNR. 2. PATIENT/CAREGIVER : Symptom management, wound management, safe transfers, s/s of infection 3. DISEASE STATUS: Virtual check-in visit completed via telephone. Patient just finished eating her breakfast and is currently sitting up in her wheelchair. She denies pain. [name] [name] states that patient continues to have more elevated blood pressures in the am, but after her medications they come down. She continues on Losartan and Clonidine. They check her blood sugars twice daily and adjust her Metformin accordingly. Her appetite is variable. She usually eats a good breakfast. Lunch is usually hit or miss and she will drink a milkshake or eating something small. Dinner is usually good, but there are days where she will not want to eat anything. Her weight has been stable. Denies dysphagia. Continues to eat softer foods and takes her medications crushed in applesauce. She says that recently her sacral wounds started to open up and she started covering them with a dressing. They have since closed back up and they are back to applying barrier cream after each incontinent episode. She has an old, healed left leg wound that started opening up recently and they are now covering it again. She says it is now getting better since covering it.  They have noticed slightly more swelling in this leg lately. They do keep her legs elevated and monitor her sodium intake. Overall, she feels her symptoms are managed with her current medication regimen. Will continue to monitor.   HISTORY OF PRESENT ILLNESS: This is a 85 yo female with a diagnosis of dementia without behavioral disturbances. Palliative care team continues to follow patient and visits monthly and PRN    CODE STATUS: DNR ADVANCED DIRECTIVES: Y MOST FORM: no PPS: 30%   (Duration of visit and documentation 45 minutes)   Daryl Eastern, RN BSN

## 2020-05-19 ENCOUNTER — Telehealth: Payer: Self-pay

## 2020-05-19 MED ORDER — METFORMIN HCL 500 MG PO TABS
500.0000 mg | ORAL_TABLET | Freq: Two times a day (BID) | ORAL | 0 refills | Status: DC
Start: 2020-05-19 — End: 2020-09-01

## 2020-05-19 NOTE — Telephone Encounter (Signed)
Okay to restart metformin 500 mg by mouth twice daily with meals

## 2020-05-19 NOTE — Telephone Encounter (Signed)
Would she like to give the metformin 500 mg by mouth twice daily? It looks like she currently has metformin 1000 mg by mouth daily prescribed.

## 2020-05-19 NOTE — Telephone Encounter (Signed)
No she wants to take her back to the way it was before Dinah changed it

## 2020-05-19 NOTE — Telephone Encounter (Signed)
Patients daughter called and requested if her mother's metformin could be changed back to the 500 mg instead of the 1000 mg she had requested before the holiday's   Please Advise

## 2020-06-01 ENCOUNTER — Other Ambulatory Visit: Payer: Medicare Other | Admitting: *Deleted

## 2020-06-01 ENCOUNTER — Other Ambulatory Visit: Payer: Self-pay

## 2020-06-01 DIAGNOSIS — Z515 Encounter for palliative care: Secondary | ICD-10-CM

## 2020-06-09 NOTE — Progress Notes (Signed)
COMMUNITY PALLIATIVE CARE RN NOTE  PATIENT NAME: [name] DOB: Sep 16, 1935 MRN: [id number]  PRIMARY CARE PROVIDER: [name], NP  RESPONSIBLE PARTY:  Acct ID - Guarantor Home Phone Work Phone Relationship Acct Type  [id number] [name], [name]  Self P/F     [address] [name], [place] [address]   Due to the COVID-19 crisis, this virtual check-in visit was done via telephone from my office and it was initiated and consent by this patient and or family.  PLAN OF CARE and INTERVENTION:  1. ADVANCE CARE PLANNING/GOALS OF CARE: Goal is for patient to remain at home with her son and daughter-in-law and avoid hospitalizations. She has a DNR. 2. PATIENT/CAREGIVER EDUCATION: Symptom management, safe mobility/transfers, prevention/maintenance of skin breakdown, s/s of infection 3. DISEASE STATUS: Virtual check-in visit completed via telephone. Patient is currently sitting up in her wheelchair. She denies pain at this time. Patient remains total care with all ADLs. She is transferred via 1-2 person assistance to bedside commode or wheelchair. She continues with a hired caregiver during the day. Her blood pressures continue to be elevated in the mornings (180s-190s/90s-100s), but is brought down with her current medication regimen (Losartan 75 mg and Clonidine 0.1 mg). She is eating 3 meals per day. They have lessened her portion sizes and says she is eating better at all meals d/t this change. Her blood sugars are running between 100s to 140s. They are checking them twice daily. Family adjusts the Metformin according to CBG readings. [name] says that she is noticing that when patient's blood sugar is low, that sometimes it seems that her blood pressure is more elevated. She does experience intermittent constipation. Prune juice is usually effective in managing this. She also has Miralax available, however [name] does not feel it is effective. She says it did seem to work well in the  past, but not recently. She is incontinent of both bowel and bladder and wears adult briefs. She does not currently have any open areas to her sacrum. Barrier cream is being applied after each incontinent episode. During our conversation last month, patient had an open area noted to her right leg from an old healed leg wound and had to cover it. This area has currently healed. The area is pinkish in color. They are no longer having to cover this. She continues with ongoing edema to bilateral lower extremities but says it is stable. Will continue to monitor.   HISTORY OF PRESENT ILLNESS: This is a 85 yo female with a diagnosis of dementia without behavioral disturbances. Palliative care team continues to follow patient and visits monthly and PRN     CODE STATUS: DNR ADVANCED DIRECTIVES: Y MOST FORM: no PPS: 30%   (Duration of visit and documentation 45 minutes)   Carolyn Eastern, RN BSN

## 2020-06-29 ENCOUNTER — Other Ambulatory Visit: Payer: Self-pay

## 2020-06-29 ENCOUNTER — Other Ambulatory Visit: Payer: Medicare Other | Admitting: *Deleted

## 2020-06-29 DIAGNOSIS — Z515 Encounter for palliative care: Secondary | ICD-10-CM

## 2020-06-30 ENCOUNTER — Ambulatory Visit: Payer: Medicare Other | Attending: Critical Care Medicine

## 2020-06-30 DIAGNOSIS — Z23 Encounter for immunization: Secondary | ICD-10-CM

## 2020-06-30 NOTE — Progress Notes (Signed)
   Covid-19 Vaccination Clinic  Name:  [name]    MRN: [id number] DOB: 05-Aug-1935  06/30/2020  [name] was observed post Covid-19 immunization for 15 minutes without incident. She was provided with Vaccine Information Sheet and instruction to access the V-Safe system.   [name] was instructed to call 911 with any severe reactions post vaccine: Marland Kitchen Difficulty breathing  . Swelling of face and throat  . A fast heartbeat  . A bad rash all over body  . Dizziness and weakness   Immunizations Administered    Name Date Dose VIS Date Route   PFIZER Comrnaty(Gray TOP) Covid-19 Vaccine 06/30/2020 10:00 AM 0.3 mL 04/16/2020 Intramuscular   Manufacturer: [employer], [employer]   Lot: [id number]   NDC: [phone]

## 2020-06-30 NOTE — Progress Notes (Signed)
COMMUNITY PALLIATIVE CARE RN NOTE  PATIENT NAME: [name] DOB: 08/02/1935 MRN: [id number]  PRIMARY CARE PROVIDER: [name], NP  RESPONSIBLE PARTY:  Acct ID - Guarantor Home Phone Work Phone Relationship Acct Type  [id number] [name]  Self P/F     [address], [place], [place] [address]   Due to the COVID-19 crisis, this virtual check-in visit was done via telephone from my office and it was initiated and consent by this patient and or family.  PLAN OF CARE and INTERVENTION:  1. ADVANCE CARE PLANNING/GOALS OF CARE: Goal is for patient to remain in the home with her son and daughter in law. She has a DNR. 2. PATIENT/CAREGIVER EDUCATION: Symptom management, safe mobility/transfers, s/s of infection 3. DISEASE STATUS: Virtual check-in visit completed via telephone. Patient is currently sitting up in her wheelchair. [name] denies patient having any pain at this time. [name] states that patient had a syncopal episode yesterday while sitting up in her wheelchair. She was out of town and received a phone call from her hired caregiver. She states she was having bad diarrhea and a very large BM. They contemplated calling EMS because they noticed it was taking longer than usual for her to come around. Her cognitive status and ability to talk clearly improved within about 30 minutes so they decided not to call EMS. Today, she is back at her usual baseline. I suspect she may have had a possible vasovagal response. She is able to answer simple questions and make her needs known. She is intermittently confused. Her appetite has been good. Her blood sugar this am was 127. It is checked twice daily and takes Metformin 500 mg twice a day. They will hold medication if it is low. Her blood pressures remain controlled with her current medication regimen, Losartan and Clonidine. She is total care with all ADLs. She is incontinent of both bowel and bladder. She does have some instances  of constipation and they will use prune juice or Miralax to help. No open areas reported on her bottom. Barrier cream is applied with each brief change. No skin issues at this time. She continues with hired caregivers 7 days/week from 8a-8p. Will continue to monitor.   HISTORY OF PRESENT ILLNESS: This is a 85 yo female with a diagnosis of dementia without behavioral disturbances. She has a hx of CVA, TIA, DM II, HLD and left sided weakness,  Palliative care team continues to follow patient and visits monthly and PRN.  CODE STATUS: DNR ADVANCED DIRECTIVES: Y MOST FORM: no PPS: 30%   (Duration of visit and documentation 45 minutes)   Carolyn Eastern, RN BSN

## 2020-07-01 ENCOUNTER — Ambulatory Visit: Payer: Medicare Other | Admitting: Nurse Practitioner

## 2020-07-06 ENCOUNTER — Other Ambulatory Visit: Payer: Self-pay

## 2020-07-06 ENCOUNTER — Ambulatory Visit (INDEPENDENT_AMBULATORY_CARE_PROVIDER_SITE_OTHER): Payer: Medicare Other | Admitting: Nurse Practitioner

## 2020-07-06 ENCOUNTER — Encounter: Payer: Self-pay | Admitting: Nurse Practitioner

## 2020-07-06 VITALS — BP 122/78 | HR 98 | Temp 97.3°F

## 2020-07-06 DIAGNOSIS — Z86718 Personal history of other venous thrombosis and embolism: Secondary | ICD-10-CM | POA: Diagnosis not present

## 2020-07-06 DIAGNOSIS — E1142 Type 2 diabetes mellitus with diabetic polyneuropathy: Secondary | ICD-10-CM

## 2020-07-06 DIAGNOSIS — F0390 Unspecified dementia without behavioral disturbance: Secondary | ICD-10-CM

## 2020-07-06 DIAGNOSIS — Z86711 Personal history of pulmonary embolism: Secondary | ICD-10-CM | POA: Diagnosis not present

## 2020-07-06 DIAGNOSIS — E782 Mixed hyperlipidemia: Secondary | ICD-10-CM

## 2020-07-06 DIAGNOSIS — R7989 Other specified abnormal findings of blood chemistry: Secondary | ICD-10-CM | POA: Diagnosis not present

## 2020-07-06 DIAGNOSIS — R600 Localized edema: Secondary | ICD-10-CM

## 2020-07-06 DIAGNOSIS — I10 Essential (primary) hypertension: Secondary | ICD-10-CM | POA: Diagnosis not present

## 2020-07-06 NOTE — Progress Notes (Signed)
Careteam: Patient Care Team: [name], NP as PCP - General (Geriatric Medicine) [name], MD as Consulting Physician (Oncology) [name], [name], LCSW as Social Worker (Licensed Clinical Social Worker) [name], RN as Registered Nurse (Hospice and Palliative Medicine)  PLACE OF SERVICE:  [place] Directive information    No Known Allergies  Chief Complaint  Patient presents with  . Medical Management of Chronic Issues    4 month follow-up, discuss abnormal mouth movement. Discuss need for PNA, TD/tdap and eye exam or exclude.Patient with syncope episodes, usually happens after patient has eaten (heavy meal) or while having a bowel movement. Last episode 2 Sunday's ago.  Here with daughter in law     HPI: Patient is a 85 y.o. female for routine follow up. Here with daughter-in-law providing hx  DM- avg blood sugar 105-120 fasting. Blood sugar 150s in the evening.  She has been adjusting medications based on blood sugars No hypoglycemia noted.  Taking metformin 500 twice daily  No sores or open areas  Hx PE and DVT- on xarelto, no abnormal bleeding or brusing.   Constipation- having more issues with constipation. Hard bigger stools. Has a syncopal episode while having a BM, came out of it.    Hyperlipidemia- continues on lipitor     Review of Systems:  Review of Systems  Unable to perform ROS: Dementia    Past Medical History:  Diagnosis Date  . Dementia ([place])   . Diabetes mellitus type II 03/21/2011  . DVT (deep venous thrombosis) ([place]) 03/21/2011  . Hyperlipidemia 03/21/2011  . Hypertension 03/21/2011  . Lymphedema of leg 03/21/2011  . Pulmonary embolism ([place]) 03/21/2011  . Stroke [facility])    Past Surgical History:  Procedure Laterality Date  . CHOLECYSTECTOMY  2015  . SKIN TAG REMOVAL  07/25/2016  . VAGINAL HYSTERECTOMY     unknown of date  . VASCULAR SURGERY     Social History:   reports that she has never  smoked. She has never used smokeless tobacco. She reports that she does not drink alcohol and does not use drugs.  Family History  Problem Relation Age of Onset  . Heart attack Mother   . COPD Sister   . Stroke Sister   . Bipolar disorder Daughter     Medications: Patient's Medications  New Prescriptions   No medications on file  Previous Medications   A&D OINT    Apply 1 application topically as needed.    ACCU-CHEK AVIVA PLUS TEST STRIP    USE STRIP(S) TO TEST TWICE A DAY - (KEEP UNUSED STRIPS IN ORIGINAL SEALED CONTAINER BETWEEN USES)   ACCU-CHEK SOFTCLIX LANCETS LANCETS    1 each by Other route 2 (two) times daily. Use as instructed Dx: E11.9   ACETAMINOPHEN (TYLENOL) 500 MG TABLET    Take 500 mg by mouth every 8 (eight) hours as needed (for general pain).    ATORVASTATIN (LIPITOR) 40 MG TABLET    Take 1 tablet (40 mg total) by mouth daily at 6 PM.   CHOLECALCIFEROL (VITAMIN D-3) 125 MCG (5000 UT) TABS    Take 5,000 Units by mouth daily.   CLONIDINE (CATAPRES) 0.1 MG TABLET    Take 1 tablet (0.1 mg total) by mouth 2 (two) times daily. May take an additional tablet if needed SBP >170   DICLOFENAC SODIUM (VOLTAREN) 1 % GEL    Apply 1 application topically 2 (two) times daily as needed (pain).  GABAPENTIN (NEURONTIN) 300 MG CAPSULE    Take 1 capsule (300 mg total) by mouth 2 (two) times daily.   LOSARTAN (COZAAR) 25 MG TABLET    Take one tablet by mouth once daily, take along with 30m to equal 774m  LOSARTAN (COZAAR) 50 MG TABLET    Take one tablet by mouth once daily, along with 2527mo equal 50m74mMETFORMIN (GLUCOPHAGE) 500 MG TABLET    Take 1 tablet (500 mg total) by mouth 2 (two) times daily with a meal.   NON FORMULARY    Take 1 capsule by mouth See admin instructions. Metagenics - UltraFlora Balance Daily Probiotic Immune Support* capsules- Take 1 capsule by mouth once a day   POLYETHYLENE GLYCOL (MIRALAX / GLYCOLAX) PACKET    Take 17 g by mouth daily as needed for mild  constipation (MIX AND DRINK).    RIVAROXABAN (XARELTO) 15 MG TABS TABLET    Take one tablet by mouth once daily with supper  Modified Medications   No medications on file  Discontinued Medications   No medications on file    Physical Exam:  Vitals:   07/06/20 1602  BP: 122/78  Pulse: 98  Temp: (!) 97.3 F (36.3 C)  TempSrc: Temporal  SpO2: 96%   There is no height or weight on file to calculate BMI. Wt Readings from Last 3 Encounters:  05/04/19 130 lb 1.1 oz (59 kg)  11/02/18 130 lb (59 kg)  01/30/18 130 lb (59 kg)    Physical Exam Constitutional:      General: She is not in acute distress.    Appearance: She is well-developed and well-nourished. She is not diaphoretic.  HENT:     Head: Normocephalic and atraumatic.     Nose: No congestion.     Mouth/Throat:     Mouth: Oropharynx is clear and moist. Mucous membranes are moist.     Pharynx: No oropharyngeal exudate.  Eyes:     Conjunctiva/sclera: Conjunctivae normal.     Pupils: Pupils are equal, round, and reactive to light.  Cardiovascular:     Rate and Rhythm: Normal rate and regular rhythm.     Heart sounds: Normal heart sounds.  Pulmonary:     Effort: Pulmonary effort is normal.     Breath sounds: Normal breath sounds.  Abdominal:     General: Bowel sounds are normal.     Palpations: Abdomen is soft.  Musculoskeletal:        General: No tenderness.     Cervical back: Normal range of motion and neck supple.     Right lower leg: Edema present.     Left lower leg: Edema present.     Comments: kyphosis   Skin:    General: Skin is warm and dry.  Neurological:     Mental Status: She is alert. Mental status is at baseline.     Motor: Weakness present.     Gait: Gait abnormal.  Psychiatric:        Mood and Affect: Mood and affect normal.     Comments: Nonverbal     Labs reviewed: Basic Metabolic Panel: Recent Labs    08/21/19 1101 02/26/20 1154  NA 138 139  K 4.6 4.9  CL 101 100  CO2 25 27   GLUCOSE 145* 209*  BUN 17 26*  CREATININE 0.46* 0.62  CALCIUM 9.3 9.8   Liver Function Tests: Recent Labs    08/21/19 1101 02/26/20 1154  AST 12 17  ALT 11 18  BILITOT 0.4 0.4  PROT 6.8 7.0   No results for input(s): LIPASE, AMYLASE in the last 8760 hours. No results for input(s): AMMONIA in the last 8760 hours. CBC: Recent Labs    08/21/19 1101 02/26/20 1154  WBC 3.2* 4.0  NEUTROABS 1,443* 1,912  HGB 11.4* 11.2*  HCT 35.3 35.4  MCV 91.0 91.2  PLT 230 230   Lipid Panel: Recent Labs    08/21/19 1101 02/26/20 1154  CHOL 175 171  HDL 47* 49*  LDLCALC 112* 103*  TRIG 74 101  CHOLHDL 3.7 3.5   TSH: No results for input(s): TSH in the last 8760 hours. A1C: Lab Results  Component Value Date   HGBA1C 7.8 (H) 02/26/2020     Assessment/Plan 1. History of DVT (deep vein thrombosis) -continues on xarelto. No signs of recurrent DVT or abnormal bleeding or bruising.  - CBC with Differential/Platelet  2. Type 2 diabetes mellitus with diabetic polyneuropathy, without long-term current use of insulin (HCC) -stable on current regimen. Encouraged dietary compliance, routine foot care/monitoring and to keep up with diabetic eye exams through ophthalmology  - Hemoglobin A1c  3. Dementia without behavioral disturbance, unspecified dementia type ([place]) -stable, she is total care by her family.   4. Essential hypertension -controlled on losartan and catapres  - CBC with Differential/Platelet - CMP with eGFR([facility])  5. Bilateral lower extremity edema -stable, elevate LE as tolerates.   6. Mixed hyperlipidemia -continues on lipitor  7. History of pulmonary embolism -continues on xarelto, no abnormal bruising or bleeding.   Next appt: 6 months.  Carlos American. [place], [facility] [phone]

## 2020-07-09 LAB — TEST AUTHORIZATION

## 2020-07-09 LAB — COMPLETE METABOLIC PANEL WITH GFR
AG Ratio: 1.2 (calc) (ref 1.0–2.5)
ALT: 9 U/L (ref 6–29)
AST: 12 U/L (ref 10–35)
Albumin: 3.4 g/dL — ABNORMAL LOW (ref 3.6–5.1)
Alkaline phosphatase (APISO): 71 U/L (ref 37–153)
BUN/Creatinine Ratio: 41 (calc) — ABNORMAL HIGH (ref 6–22)
BUN: 22 mg/dL (ref 7–25)
CO2: 22 mmol/L (ref 20–32)
Calcium: 8.9 mg/dL (ref 8.6–10.4)
Chloride: 99 mmol/L (ref 98–110)
Creat: 0.54 mg/dL — ABNORMAL LOW (ref 0.60–0.88)
GFR, Est African American: 102 mL/min/{1.73_m2} (ref >=60)
GFR, Est Non African American: 88 mL/min/{1.73_m2} (ref >=60)
Globulin: 2.9 g/dL (calc) (ref 1.9–3.7)
Glucose, Bld: 127 mg/dL (ref 65–139)
Potassium: 4.8 mmol/L (ref 3.5–5.3)
Sodium: 134 mmol/L — ABNORMAL LOW (ref 135–146)
Total Bilirubin: 0.3 mg/dL (ref 0.2–1.2)
Total Protein: 6.3 g/dL (ref 6.1–8.1)

## 2020-07-09 LAB — CBC WITH DIFFERENTIAL/PLATELET
Absolute Monocytes: 511 cells/uL (ref 200–950)
Basophils Absolute: 18 cells/uL (ref 0–200)
Basophils Relative: 0.4 %
Eosinophils Absolute: 32 cells/uL (ref 15–500)
Eosinophils Relative: 0.7 %
HCT: 29.2 % — ABNORMAL LOW (ref 35.0–45.0)
Hemoglobin: 9.5 g/dL — ABNORMAL LOW (ref 11.7–15.5)
Lymphs Abs: 1964 cells/uL (ref 850–3900)
MCH: 29.1 pg (ref 27.0–33.0)
MCHC: 32.5 g/dL (ref 32.0–36.0)
MCV: 89.3 fL (ref 80.0–100.0)
MPV: 8.8 fL (ref 7.5–12.5)
Monocytes Relative: 11.1 %
Neutro Abs: 2075 cells/uL (ref 1500–7800)
Neutrophils Relative %: 45.1 %
Platelets: 207 10*3/uL (ref 140–400)
RBC: 3.27 10*6/uL — ABNORMAL LOW (ref 3.80–5.10)
RDW: 12 % (ref 11.0–15.0)
Total Lymphocyte: 42.7 %
WBC: 4.6 10*3/uL (ref 3.8–10.8)

## 2020-07-09 LAB — HEMOGLOBIN A1C
Hgb A1c MFr Bld: 7.3 % of total Hgb — ABNORMAL HIGH (ref <5.7)
Mean Plasma Glucose: 163 mg/dL
eAG (mmol/L): 9 mmol/L

## 2020-07-09 LAB — IRON,TIBC AND FERRITIN PANEL
%SAT: 26 % (calc) (ref 16–45)
Ferritin: 33 ng/mL (ref 16–288)
Iron: 61 ug/dL (ref 45–160)
TIBC: 232 mcg/dL (calc) — ABNORMAL LOW (ref 250–450)

## 2020-07-10 ENCOUNTER — Telehealth: Payer: Self-pay | Admitting: *Deleted

## 2020-07-10 NOTE — Telephone Encounter (Signed)
Received fax from [facility] [phone]. Reviewing claim to determine eligibility for ongoing benefits.   Placed form in [place] folder to review, fill out and sign.  To be faxed to [phone] once completed.

## 2020-07-24 NOTE — Telephone Encounter (Signed)
Janett Billow filled out form.  Faxed back to [place] [facility] Fax: [phone] Sent for scanning.

## 2020-07-30 ENCOUNTER — Other Ambulatory Visit: Payer: Medicare Other | Admitting: *Deleted

## 2020-07-30 ENCOUNTER — Other Ambulatory Visit: Payer: Self-pay

## 2020-07-30 DIAGNOSIS — Z515 Encounter for palliative care: Secondary | ICD-10-CM

## 2020-08-17 NOTE — Progress Notes (Signed)
COMMUNITY PALLIATIVE CARE RN NOTE  PATIENT NAME: [name] DOB: 1935-08-08 MRN: [id number]  PRIMARY CARE PROVIDER: [name], NP  RESPONSIBLE PARTY:  Acct ID - Guarantor Home Phone Work Phone Relationship Acct Type  [id number] NATEISHA, [phone]  Self P/F     [address], [place], [place] [address]   Due to the COVID-19 crisis, this virtual check-in visit was done via telephone from my office and it was initiated and consent by this patient and or family.  PLAN OF CARE and INTERVENTION:  1. ADVANCE CARE PLANNING/GOALS OF CARE: Goal is for patient to remain in the home with her son and daughter in law. She has a DNR. 2. PATIENT/CAREGIVER EDUCATION: Symptom management, bowel management, safe transfers, s/s of infection 3. DISEASE STATUS: Virtual check-in visit completed via telephone. Patient is currently lying in bed and just recently finishing her breakfast. [name] states she is alert and remains able to answer simple questions. She is intermittently confused. She denies pain at this time. No breathing issues. She remains dependent for all ADLs and transferred via 1-2 person assistance to bedside commode or wheelchair. She had a recent appointment with her PCP and had lab work done. She says that her labs looked good and A1C was down from 7.8 to 7.3. Her Hgb was down some, from 11.2 in Oct 2021 to 9.5 now. [name] denies any patient bleeding that she is aware of. No changes made to plan of care. CBG this morning was 117. It is checked twice daily. Family adjusts Metformin between 1/2 to 1 tablet. She continues on Losartan and Clonidine to manage her BP. Her appetite is good, eating 50meals/day along with good fluid intake. They do have issues at times with constipation. They are giving her prune juice and Miralax to help. She had a large bowel movement (BM) last week, 4 days ago a small BM and a smear today. I also suggested having a Dulcolax suppository on hand if she doesn't  have a BM in 3 days. They also have used enemas in the past with some success. She is incontinent of both bowel and bladder and wears Depends. She has a small open area on her bottom that they are monitoring. They continue with barrier cream after each incontinent episode. [name] says that starting back in January, she has noticed patient's bottom lip inside her mouth or just one side and motions her mouth as if she is chewing on it. She is questioning whether patient may be developing tardive dyskinesia. It does not seem to distress her and there are no sores on her lips from this action. Suggested she mention this to PCP if it causes concern. Will continue to monitor.   HISTORY OF PRESENT ILLNESS: This is a 85 yo female with a diagnosis of dementia without behavioral disturbances. She has a hx of CVA, TIA, DM II, HLD and left sided weakness,  Palliative care team continues to follow patient and visits monthly and PRN   CODE STATUS: DNR  ADVANCED DIRECTIVES: Y MOST FORM: no  PPS: 30%   (Duration of visit and documentation 45 minutes)   Daryl Eastern, RN BSN

## 2020-08-20 ENCOUNTER — Other Ambulatory Visit: Payer: Medicare Other | Admitting: *Deleted

## 2020-08-20 ENCOUNTER — Other Ambulatory Visit: Payer: Self-pay

## 2020-08-20 DIAGNOSIS — Z515 Encounter for palliative care: Secondary | ICD-10-CM

## 2020-08-20 NOTE — Progress Notes (Signed)
COMMUNITY PALLIATIVE CARE RN NOTE  PATIENT NAME: [name] DOB: 11/16/1935 MRN: [id number]  PRIMARY CARE PROVIDER: [name], NP  RESPONSIBLE PARTY:  Acct ID - Guarantor Home Phone Work Phone Relationship Acct Type  [id number] [name], [name]  Self P/F     [address] [place], [place] [address]   Due to the COVID-19 crisis, this virtual check-in visit was done via telephone from my office and it was initiated and consent by this patient and or family.  PLAN OF CARE and INTERVENTION:  1. ADVANCE CARE PLANNING/GOALS OF CARE: Goal is for patient to remain at home with her son and daughter-in-law and avoid hospitalizations.  2. PATIENT/CAREGIVER EDUCATION 3. DISEASE STATUS: Virtual check-in visit completed via telephone. Patient is currently up on the bedside commode awake and alert. She remains intermittently confused, but able to answer some simple questions. She has days where she may be more lethargic and sleeping more, but this is not all the time. [name] states that patient is not complaining of any pain. Denies coughing or shortness of breath. She remains dependent with all ADLs. She continues with hired caregivers daily from 8a-8p to assist patient with her personal care needs. She is transferred via 2 person assistance for safety purposes. She is able to bear some weight. She has a good appetite and [name] says she has been drinking more than usual. She is drinking about 6 to 7 8 oz glasses of water per day. Her blood sugars have been below 150s in the mornings and mainly the same in the evenings. She will occasionally have a more elevated blood sugar in the evenings from the 190s-230s. She continues to adjust the Metformin 500 mg tablets between 1/2 to 1 tablet depending on the readings. She continues to make a chewing motion on either side of her lip or at times her entire lip causing her lip to poke out. No sores or issues have developed because of this. They say it  seems to be more noticeable when she is more tired. She sits up for about 2-2 1/2 hours after she eats. No issues with dysphagia. The last few days she has had small bowel movements. Last large BM was on 4/8. She had another syncopal episode on 08/09/20 while sitting up in her wheelchair. The caregiver was able to get patient in bed and afterwards she had a very large bowel movement. This also occurred back in February of this year. They were able to manage both episodes at home. The small open area on her bottom has closed. They continue to apply barrier cream after each incontinent episode. Will continue to monitor.   HISTORY OF PRESENT ILLNESS:This is a 85 yo female with a diagnosis of dementia without behavioral disturbances. She has a hx of CVA, TIA, DM II, HLD and left sided weakness,  Palliative care team continues to follow patient and visits monthly and PRN.    CODE STATUS: DNR ADVANCED DIRECTIVES: Y MOST FORM: no PPS: 30%   (Duration of visit and documentation 30 minutes)   Carolyn Eastern, RN BSN

## 2020-09-01 ENCOUNTER — Telehealth (INDEPENDENT_AMBULATORY_CARE_PROVIDER_SITE_OTHER): Payer: Medicare Other | Admitting: Nurse Practitioner

## 2020-09-01 ENCOUNTER — Other Ambulatory Visit: Payer: Self-pay

## 2020-09-01 ENCOUNTER — Telehealth: Payer: Self-pay

## 2020-09-01 DIAGNOSIS — M24532 Contracture, left wrist: Secondary | ICD-10-CM

## 2020-09-01 DIAGNOSIS — R259 Unspecified abnormal involuntary movements: Secondary | ICD-10-CM

## 2020-09-01 DIAGNOSIS — E1142 Type 2 diabetes mellitus with diabetic polyneuropathy: Secondary | ICD-10-CM

## 2020-09-01 DIAGNOSIS — Z86718 Personal history of other venous thrombosis and embolism: Secondary | ICD-10-CM

## 2020-09-01 DIAGNOSIS — I10 Essential (primary) hypertension: Secondary | ICD-10-CM

## 2020-09-01 DIAGNOSIS — E785 Hyperlipidemia, unspecified: Secondary | ICD-10-CM

## 2020-09-01 DIAGNOSIS — E1169 Type 2 diabetes mellitus with other specified complication: Secondary | ICD-10-CM

## 2020-09-01 MED ORDER — ACCU-CHEK SOFTCLIX LANCETS MISC: 1.0000 | Freq: Two times a day (BID) | 11 refills | Status: AC

## 2020-09-01 MED ORDER — RIVAROXABAN 15 MG PO TABS: ORAL_TABLET | ORAL | 1 refills | Status: DC

## 2020-09-01 MED ORDER — ACCU-CHEK AVIVA PLUS VI STRP: ORAL_STRIP | 11 refills | Status: DC

## 2020-09-01 MED ORDER — ATORVASTATIN CALCIUM 40 MG PO TABS: 40.0000 mg | ORAL_TABLET | Freq: Every day | ORAL | 1 refills | Status: DC

## 2020-09-01 MED ORDER — METFORMIN HCL 500 MG PO TABS: 500.0000 mg | ORAL_TABLET | Freq: Two times a day (BID) | ORAL | 0 refills | Status: DC

## 2020-09-01 MED ORDER — GABAPENTIN 300 MG PO CAPS: 300.0000 mg | ORAL_CAPSULE | Freq: Two times a day (BID) | ORAL | 1 refills | Status: DC

## 2020-09-01 MED ORDER — LOSARTAN POTASSIUM 50 MG PO TABS: ORAL_TABLET | ORAL | 1 refills | Status: DC

## 2020-09-01 MED ORDER — LOSARTAN POTASSIUM 25 MG PO TABS: ORAL_TABLET | ORAL | 1 refills | Status: DC

## 2020-09-01 NOTE — Progress Notes (Signed)
Careteam: Patient Care Team: [name], NP as PCP - General (Geriatric Medicine) [name], MD as Consulting Physician (Oncology) [name], [name], LCSW as Social Worker (Licensed Clinical Social Worker) [name], RN as Registered Nurse [facility])  Advanced Directive information    No Known Allergies  Chief Complaint  Patient presents with  . Acute Visit    Concerns about left wrist and hand. Past couple of weeks wrist act like it wants to lock up. Patient has some pain in wrist in the morning and when trying to straighten it out. No swelling in area. Also concerns about extra mouth movements.     HPI: Patient is a 85 y.o. female mychart video visit due to wrist concerns.   [name] on call and reports left wrist is now at a 90 degree angle. Has limited ROM to right elbow. She is dependant of ADLs. No swelling, heat, pain noted.  She previously had a brace for the hands because she was making fist with her hands but does not have wrist support.   She has ongoing mouth movements. Has been doing this for a while. [name] questioned if she as hungry or thirsty. She is nonverbal and unable co communicate needs.  She is drinking well at this time. Does not appear to be in discomfort or pain  Blood sugars in the morning 100s-110s, in the evening 160s they have been decreasing metformin and blood sugars still in normal range.   Review of Systems:  Review of Systems  Unable to perform ROS: Dementia    Past Medical History:  Diagnosis Date  . Dementia ([place])   . Diabetes mellitus type II 03/21/2011  . DVT (deep venous thrombosis) ([place]) 03/21/2011  . Hyperlipidemia 03/21/2011  . Hypertension 03/21/2011  . Lymphedema of leg 03/21/2011  . Pulmonary embolism ([place]) 03/21/2011  . Stroke [facility])    Past Surgical History:  Procedure Laterality Date  . CHOLECYSTECTOMY  2015  . SKIN TAG REMOVAL  07/25/2016  . VAGINAL  HYSTERECTOMY     unknown of date  . VASCULAR SURGERY     Social History:   reports that she has never smoked. She has never used smokeless tobacco. She reports that she does not drink alcohol and does not use drugs.  Family History  Problem Relation Age of Onset  . Heart attack Mother   . COPD Sister   . Stroke Sister   . Bipolar disorder Daughter     Medications: Patient's Medications  New Prescriptions   No medications on file  Previous Medications   A&D OINT    Apply 1 application topically as needed.    ACCU-CHEK SOFTCLIX LANCETS LANCETS    1 each by Other route 2 (two) times daily. Use as instructed Dx: E11.9   ACETAMINOPHEN (TYLENOL) 500 MG TABLET    Take 500 mg by mouth every 8 (eight) hours as needed (for general pain).    ATORVASTATIN (LIPITOR) 40 MG TABLET    Take 1 tablet (40 mg total) by mouth daily at 6 PM.   CHOLECALCIFEROL (VITAMIN D-3) 125 MCG (5000 UT) TABS    Take 5,000 Units by mouth daily.   CLONIDINE (CATAPRES) 0.1 MG TABLET    Take 1 tablet (0.1 mg total) by mouth 2 (two) times daily. May take an additional tablet if needed SBP >170   GABAPENTIN (NEURONTIN) 300 MG CAPSULE    Take 1 capsule (300 mg total) by  mouth 2 (two) times daily.   GLUCOSE BLOOD (ACCU-CHEK AVIVA PLUS) TEST STRIP    USE STRIP(S) TO TEST TWICE A DAY - (KEEP UNUSED STRIPS IN ORIGINAL SEALED CONTAINER BETWEEN USES)   LOSARTAN (COZAAR) 25 MG TABLET    Take one tablet by mouth once daily, take along with 50mg  to equal 75mg    LOSARTAN (COZAAR) 50 MG TABLET    Take one tablet by mouth once daily, along with 25mg  to equal 75mg    METFORMIN (GLUCOPHAGE) 500 MG TABLET    Take 1 tablet (500 mg total) by mouth 2 (two) times daily with a meal.   NON FORMULARY    Take 1 capsule by mouth See admin instructions. Metagenics - UltraFlora Balance Daily Probiotic Immune Support* capsules- Take 1 capsule by mouth once a day   POLYETHYLENE GLYCOL (MIRALAX / GLYCOLAX) PACKET    Take 17 g by mouth daily as needed for  mild constipation (MIX AND DRINK).    RIVAROXABAN (XARELTO) 15 MG TABS TABLET    Take one tablet by mouth once daily with supper  Modified Medications   No medications on file  Discontinued Medications   DICLOFENAC SODIUM (VOLTAREN) 1 % GEL    Apply 1 application topically 2 (two) times daily as needed (pain).     Physical Exam:  There were no vitals filed for this visit. There is no height or weight on file to calculate BMI. Wt Readings from Last 3 Encounters:  05/04/19 130 lb 1.1 oz (59 kg)  11/02/18 130 lb (59 kg)  01/30/18 130 lb (59 kg)    Physical Exam  Labs reviewed: Basic Metabolic Panel: Recent Labs    02/26/20 1154 07/06/20 1632  NA 139 134*  K 4.9 4.8  CL 100 99  CO2 27 22  GLUCOSE 209* 127  BUN 26* 22  CREATININE 0.62 0.54*  CALCIUM 9.8 8.9   Liver Function Tests: Recent Labs    02/26/20 1154 07/06/20 1632  AST 17 12  ALT 18 9  BILITOT 0.4 0.3  PROT 7.0 6.3   No results for input(s): LIPASE, AMYLASE in the last 8760 hours. No results for input(s): AMMONIA in the last 8760 hours. CBC: Recent Labs    02/26/20 1154 07/06/20 1632  WBC 4.0 4.6  NEUTROABS 1,912 2,075  HGB 11.2* 9.5*  HCT 35.4 29.2*  MCV 91.2 89.3  PLT 230 207   Lipid Panel: Recent Labs    02/26/20 1154  CHOL 171  HDL 49*  LDLCALC 103*  TRIG 101  CHOLHDL 3.5   TSH: No results for input(s): TSH in the last 8760 hours. A1C: Lab Results  Component Value Date   HGBA1C 7.3 (H) 07/06/2020     Assessment/Plan 1. Contracture of left wrist -she is wheelchair bound and dependant of all ADLs. Encouraged ROM of wrist as she can tolerate. She can also use splint to help wrist stay straight.   2. Type 2 diabetes mellitus with diabetic polyneuropathy, without long-term current use of insulin (HCC) -stable she has decreased metformin in morning if fasting blood sugars in range. Continues to eat well and overall blood sugars are well controlled.  -will continue current regimen.    3. Abnormal involuntary movement Ongoing, supportive care. Likely due to the progression of dementia.   Carlos American. Harle Battiest  [facility] [phone]    Virtual Visit via mychart video  I connected with patient on 09/01/20 at  1:30 PM EDT by video and verified  that I am speaking with the correct person using two identifiers.  Location: Patient: home Provider: [facility]    I discussed the limitations, risks, security and privacy concerns of performing an evaluation and management service by telephone and the availability of in person appointments. I also discussed with the patient that there may be a patient responsible charge related to this service. The patient expressed understanding and agreed to proceed.   I discussed the assessment and treatment plan with the patient. The patient was provided an opportunity to ask questions and all were answered. The patient agreed with the plan and demonstrated an understanding of the instructions.   The patient was advised to call back or seek an in-person evaluation if the symptoms worsen or if the condition fails to improve as anticipated.  I provided 18 minutes of non-face-to-face time during this encounter.  Carlos American. Harle Battiest Avs printed and mailed

## 2020-09-01 NOTE — Telephone Encounter (Signed)
Message left on clinical intake voicemail:   Patient needs a refill on all medications to [facility]  Patient with left hand and wrist concerns, appointment scheduled

## 2020-09-01 NOTE — Progress Notes (Signed)
This service is provided via telemedicine  No vital signs collected/recorded due to the encounter was a telemedicine visit.   Location of patient (ex: home, work):  Home  Patient consents to a telephone visit:  Yes, see enocunter dated 11/05/2019  Location of the provider (ex: office, home): [place]  Name of any referring provider:  N/A  Names of all persons participating in the telemedicine service and their role in the encounter: [name], Nurse Practitioner, Carroll Kinds, CMA, patient and daughter [name].  Time spent on call:  9 minutes with medical assistant

## 2020-09-18 ENCOUNTER — Telehealth: Payer: Self-pay | Admitting: *Deleted

## 2020-09-18 NOTE — Telephone Encounter (Signed)
[name], Caregiver, called and stated that patient is having Skin Breakdown as before on her "seat" area. Stated that it has gotten alittle inflamed and irritated.  Stated that when this happened last time [place] came in .  Requesting an order for Home Health to be placed.  Please Advise.

## 2020-09-20 NOTE — Telephone Encounter (Signed)
She will need an office visit for evaluation for order.

## 2020-09-21 NOTE — Telephone Encounter (Signed)
Patient caregiver schedule a myChart Visit. Stated that it is hard to get patient out of home.

## 2020-09-22 ENCOUNTER — Telehealth (INDEPENDENT_AMBULATORY_CARE_PROVIDER_SITE_OTHER): Payer: Medicare Other | Admitting: Nurse Practitioner

## 2020-09-22 ENCOUNTER — Other Ambulatory Visit: Payer: Self-pay

## 2020-09-22 DIAGNOSIS — L89153 Pressure ulcer of sacral region, stage 3: Secondary | ICD-10-CM

## 2020-09-22 NOTE — Progress Notes (Signed)
Careteam: Patient Care Team: [name], NP as PCP - General (Geriatric Medicine) [name], MD as Consulting Physician (Oncology) [name], [name], LCSW as Social Worker (Licensed Clinical Social Worker) [name], RN as Registered Nurse [facility])  Advanced Directive information    No Known Allergies  Chief Complaint  Patient presents with  . Acute Visit    Patient complains of skin breakdown.  [facility] Best Practice Recommendations    Tetanus/Tdap, Pneumonia vaccine  . Quality Metric Gaps    Eye exam     HPI: Patient is a 85 y.o. female  Due to persistent skin breakdown on sacrum  Reports it has been persistent for a few weeks but getting worse.  Now has dark area with beefy red surrounding.  No fevers or chills, no odor, drainage at this time. Several days ago it was starting to look "yucky" but had some calcium alginate and that looks better.  Not using any barrier cream at this time.    Review of Systems:  Review of Systems  Unable to perform ROS: Dementia    Past Medical History:  Diagnosis Date  . Dementia ([place])   . Diabetes mellitus type II 03/21/2011  . DVT (deep venous thrombosis) ([place]) 03/21/2011  . Hyperlipidemia 03/21/2011  . Hypertension 03/21/2011  . Lymphedema of leg 03/21/2011  . Pulmonary embolism ([place]) 03/21/2011  . Stroke [facility])    Past Surgical History:  Procedure Laterality Date  . CHOLECYSTECTOMY  2015  . SKIN TAG REMOVAL  07/25/2016  . VAGINAL HYSTERECTOMY     unknown of date  . VASCULAR SURGERY     Social History:   reports that she has never smoked. She has never used smokeless tobacco. She reports that she does not drink alcohol and does not use drugs.  Family History  Problem Relation Age of Onset  . Heart attack Mother   . COPD Sister   . Stroke Sister   . Bipolar disorder Daughter     Medications: Patient's Medications  New Prescriptions   No medications on file  Previous  Medications   A&D OINT    Apply 1 application topically as needed.    ACCU-CHEK SOFTCLIX LANCETS LANCETS    1 each by Other route 2 (two) times daily. Use as instructed Dx: E11.9   ACETAMINOPHEN (TYLENOL) 500 MG TABLET    Take 500 mg by mouth every 8 (eight) hours as needed (for general pain).    ATORVASTATIN (LIPITOR) 40 MG TABLET    Take 1 tablet (40 mg total) by mouth daily at 6 PM.   CHOLECALCIFEROL (VITAMIN D-3) 125 MCG (5000 UT) TABS    Take 5,000 Units by mouth daily.   CLONIDINE (CATAPRES) 0.1 MG TABLET    Take 1 tablet (0.1 mg total) by mouth 2 (two) times daily. May take an additional tablet if needed SBP >170   GABAPENTIN (NEURONTIN) 300 MG CAPSULE    Take 1 capsule (300 mg total) by mouth 2 (two) times daily.   GLUCOSE BLOOD (ACCU-CHEK AVIVA PLUS) TEST STRIP    USE STRIP(S) TO TEST TWICE A DAY - (KEEP UNUSED STRIPS IN ORIGINAL SEALED CONTAINER BETWEEN USES)   LOSARTAN (COZAAR) 25 MG TABLET    Take one tablet by mouth once daily, take along with 50mg  to equal 75mg    LOSARTAN (COZAAR) 50 MG TABLET    Take one tablet by mouth once daily, along with 25mg  to equal 75mg    METFORMIN (GLUCOPHAGE)  500 MG TABLET    Take 1 tablet (500 mg total) by mouth 2 (two) times daily with a meal.   NON FORMULARY    Take 1 capsule by mouth See admin instructions. Metagenics - UltraFlora Balance Daily Probiotic Immune Support* capsules- Take 1 capsule by mouth once a day   POLYETHYLENE GLYCOL (MIRALAX / GLYCOLAX) PACKET    Take 17 g by mouth daily as needed for mild constipation (MIX AND DRINK).    RIVAROXABAN (XARELTO) 15 MG TABS TABLET    Take one tablet by mouth once daily with supper  Modified Medications   No medications on file  Discontinued Medications   No medications on file    Physical Exam:  There were no vitals filed for this visit. There is no height or weight on file to calculate BMI. Wt Readings from Last 3 Encounters:  05/04/19 130 lb 1.1 oz (59 kg)  11/02/18 130 lb (59 kg)  01/30/18  130 lb (59 kg)    Physical Exam Skin:    Comments: Pressure injury noted to right side sacrum on video visit. Large area of red tissue noted with eschar to center.      Labs reviewed: Basic Metabolic Panel: Recent Labs    02/26/20 1154 07/06/20 1632  NA 139 134*  K 4.9 4.8  CL 100 99  CO2 27 22  GLUCOSE 209* 127  BUN 26* 22  CREATININE 0.62 0.54*  CALCIUM 9.8 8.9   Liver Function Tests: Recent Labs    02/26/20 1154 07/06/20 1632  AST 17 12  ALT 18 9  BILITOT 0.4 0.3  PROT 7.0 6.3   No results for input(s): LIPASE, AMYLASE in the last 8760 hours. No results for input(s): AMMONIA in the last 8760 hours. CBC: Recent Labs    02/26/20 1154 07/06/20 1632  WBC 4.0 4.6  NEUTROABS 1,912 2,075  HGB 11.2* 9.5*  HCT 35.4 29.2*  MCV 91.2 89.3  PLT 230 207   Lipid Panel: Recent Labs    02/26/20 1154  CHOL 171  HDL 49*  LDLCALC 103*  TRIG 101  CHOLHDL 3.5   TSH: No results for input(s): TSH in the last 8760 hours. A1C: Lab Results  Component Value Date   HGBA1C 7.3 (H) 07/06/2020     Assessment/Plan 1. Pressure injury of sacral region, stage 3 (HCC) -has been using calcium alginate but out at this time. Wound does not appear infected at this time. Encouraged to use a foam dressing and change every 3 days and as needed  If area becomes soiled. To increase protein intake and turn q 2 hours.   - Ambulatory referral to [facility] for nursing care to help manage   Next appt: 11/10/2020 Janett Billow K. Harle Battiest  [facility] [phone]    Virtual Visit via Deloris Ping  I connected with patient on 09/22/20 at  9:30 AM EDT by video and verified that I am speaking with the correct person using two identifiers.  Location: Patient: home Provider: [place]   I discussed the limitations, risks, security and privacy concerns of performing an evaluation and management service by telephone and the availability of in person appointments.  I also discussed with the patient that there may be a patient responsible charge related to this service. The patient expressed understanding and agreed to proceed.   I discussed the assessment and treatment plan with the patient. The patient was provided an opportunity to ask questions and all were answered.  The patient agreed with the plan and demonstrated an understanding of the instructions.   The patient was advised to call back or seek an in-person evaluation if the symptoms worsen or if the condition fails to improve as anticipated.  I provided 20 minutes of non-face-to-face time during this encounter.  Carlos American. Harle Battiest Avs printed and mailed

## 2020-09-22 NOTE — Progress Notes (Signed)
This service is provided via telemedicine  No vital signs collected/recorded due to the encounter was a telemedicine visit.   Location of patient (ex: home, work):  Home  Patient consents to a telephone visit:  Yes, see encounter dated 11/05/2019  Location of the provider (ex: office, home): [place]  Name of any referring provider: N/A  Names of all persons participating in the telemedicine service and their role in the encounter:  [name], Nurse Practitioner, Carroll Kinds, CMA, patient and [name] [name].   Time spent on call:  11 minutes with medical assistant

## 2020-09-23 ENCOUNTER — Telehealth: Payer: Self-pay

## 2020-09-23 NOTE — Telephone Encounter (Signed)
Patient had a visit with Janett Billow yesterday and was told to call if she develops a fever. Last night patient had a fever of 100.5, [name] gave patient some tylenol. Today patients temp is 98.8.  Per patrice patient usually has a below normal temp. Patient does not have any other new symptoms other than a slight cough with some meals as she packs food in her mouth at time due to mental state.     Please advise

## 2020-09-23 NOTE — Telephone Encounter (Signed)
Does the wound on her sacrum look infected? Did she take tylenol today?

## 2020-09-24 ENCOUNTER — Telehealth: Payer: Self-pay

## 2020-09-24 ENCOUNTER — Other Ambulatory Visit: Payer: Self-pay

## 2020-09-24 ENCOUNTER — Other Ambulatory Visit: Payer: Medicare Other

## 2020-09-24 ENCOUNTER — Telehealth (INDEPENDENT_AMBULATORY_CARE_PROVIDER_SITE_OTHER): Payer: Medicare Other | Admitting: Adult Health

## 2020-09-24 ENCOUNTER — Encounter: Payer: Self-pay | Admitting: Adult Health

## 2020-09-24 DIAGNOSIS — L03319 Cellulitis of trunk, unspecified: Secondary | ICD-10-CM | POA: Diagnosis not present

## 2020-09-24 DIAGNOSIS — Z515 Encounter for palliative care: Secondary | ICD-10-CM

## 2020-09-24 MED ORDER — DOXYCYCLINE HYCLATE 100 MG PO TABS: 100.0000 mg | ORAL_TABLET | Freq: Two times a day (BID) | ORAL | 0 refills | Status: AC

## 2020-09-24 NOTE — Telephone Encounter (Signed)
Would not do antibiotics if the area is a little swollen, to look for redness, warmth, purulent drainage.

## 2020-09-24 NOTE — Telephone Encounter (Signed)
Patient relative [name] called and notified about PCP [name], NP recommendations. [name] states that she will have patient do Covid 19 home test and notify us of results. [name] wants to know if patient will start on antibiotic for inflamed area she stated about earlier? Message routed back to PCP Dewaine Oats, Carlos American, NP

## 2020-09-24 NOTE — Progress Notes (Signed)
This service is provided via telemedicine  No vital signs collected/recorded due to the encounter was a telemedicine visit.   Location of patient (ex: home, work): Home  Patient consents to a telephone visit: Yes  Location of the provider (ex: office, home): [employer].  Name of any referring provider: [name], NP   Names of all persons participating in the telemedicine service and their role in the encounter: Patient, [name] "[name]", [place], [place], Seth Bake, NP.    Time spent on call: 8 minutes spent on the phone with Medical Assistant.       DATE:  09/24/2020   MRN:  [id number]  BIRTHDAY: November 09, 1935   Contact Information    Name Relation Home Work Mobile   Wirtz,[name] Relative [phone]  [phone]   [place], [place] [phone]         Code Status History    Date Active Date Inactive Code Status Order ID Comments User Context   05/04/2019 0531 05/05/2019 1659 Full Code [id number]  Etta Quill, DO ED   10/05/2016 0037 10/07/2016 0048 Full Code [id number]  Ivor Costa, MD ED   08/23/2016 2348 08/26/2016 2239 DNR [id number]  Karmen Bongo, MD Inpatient   05/11/2016 1850 05/15/2016 1929 Full Code [id number]  Vianne Bulls, MD ED   Advance Care Planning Activity    Advance Directive Documentation   Flowsheet Row Most Recent Value  Type of Advance Directive Healthcare Power of [place], Living will, Out of facility DNR (pink MOST or yellow form)  Pre-existing out of facility DNR order (yellow form or pink MOST form) --  "MOST" Form in Place? --       Chief Complaint  Patient presents with  . Acute Visit    Complains of sacral wound inflammation, redness, and running low grade fever.    HISTORY OF PRESENT ILLNESS:  This is an 85 year old female who has a chronic sacral pressure ulcer. [name] facilitated the call. She stated that 2 days ago, patient developed fever 100.5. Last night she had temperature of  99.6. She was given Tylenol and this morning, her temperature was 99.6. On the video-call, daughter-in-law showed that the surrounding tissue of the sacral wound is indurated and erythematous. It was reported that the drainage has increased with strong smell. Patient noted to be bed ridden during the video visit. She was heard moaning/groaning but did not say anything clear. Home health referral was sent 2 days ago but daughter-in-law has not heard from them. Current treatment is with Hydrofer blue.    PAST MEDICAL HISTORY:  Past Medical History:  Diagnosis Date  . Dementia ([place])   . Diabetes mellitus type II 03/21/2011  . DVT (deep venous thrombosis) ([place]) 03/21/2011  . Hyperlipidemia 03/21/2011  . Hypertension 03/21/2011  . Lymphedema of leg 03/21/2011  . Pulmonary embolism ([place]) 03/21/2011  . Stroke [facility])      CURRENT MEDICATIONS: Reviewed  Patient's Medications  New Prescriptions   No medications on file  Previous Medications   A&D OINT    Apply 1 application topically as needed.    ACCU-CHEK SOFTCLIX LANCETS LANCETS    1 each by Other route 2 (two) times daily. Use as instructed Dx: E11.9   ACETAMINOPHEN (TYLENOL) 500 MG TABLET    Take 500 mg by mouth every 8 (eight) hours as needed (for general pain).    ATORVASTATIN (LIPITOR) 40 MG TABLET    Take 1 tablet (40 mg total) by mouth daily at 6  PM.   CHOLECALCIFEROL (VITAMIN D-3) 125 MCG (5000 UT) TABS    Take 5,000 Units by mouth daily.   CLONIDINE (CATAPRES) 0.1 MG TABLET    Take 1 tablet (0.1 mg total) by mouth 2 (two) times daily. May take an additional tablet if needed SBP >170   GABAPENTIN (NEURONTIN) 300 MG CAPSULE    Take 1 capsule (300 mg total) by mouth 2 (two) times daily.   GLUCOSE BLOOD (ACCU-CHEK AVIVA PLUS) TEST STRIP    USE STRIP(S) TO TEST TWICE A DAY - (KEEP UNUSED STRIPS IN ORIGINAL SEALED CONTAINER BETWEEN USES)   LOSARTAN (COZAAR) 25 MG TABLET    Take one tablet by mouth once daily, take along with 50mg  to equal  75mg    LOSARTAN (COZAAR) 50 MG TABLET    Take one tablet by mouth once daily, along with 25mg  to equal 75mg    METFORMIN (GLUCOPHAGE) 500 MG TABLET    Take 1 tablet (500 mg total) by mouth 2 (two) times daily with a meal.   NON FORMULARY    Take 1 capsule by mouth See admin instructions. Metagenics - UltraFlora Balance Daily Probiotic Immune Support* capsules- Take 1 capsule by mouth once a day   POLYETHYLENE GLYCOL (MIRALAX / GLYCOLAX) PACKET    Take 17 g by mouth daily as needed for mild constipation (MIX AND DRINK).    RIVAROXABAN (XARELTO) 15 MG TABS TABLET    Take one tablet by mouth once daily with supper  Modified Medications   No medications on file  Discontinued Medications   No medications on file     No Known Allergies   REVIEW OF SYSTEMS:  GENERAL: no change in appetite, no fatigue, no weight changes, +fever SKIN: see HPI MOUTH and THROAT: Denies oral discomfort, gingival pain or bleeding RESPIRATORY: no cough, SOB, DOE, wheezing, hemoptysis CARDIAC: no chest pain or palpitations GI: no abdominal pain, diarrhea, constipation, heart burn, nausea or vomiting GU: Denies dysuria, frequency, hematuria or discharge PSYCHIATRIC: No report of hallucinations, insomnia, paranoia, or agitation    LABS/RADIOLOGY: Labs reviewed: Basic Metabolic Panel: Recent Labs    02/26/20 1154 07/06/20 1632  NA 139 134*  K 4.9 4.8  CL 100 99  CO2 27 22  GLUCOSE 209* 127  BUN 26* 22  CREATININE 0.62 0.54*  CALCIUM 9.8 8.9   Liver Function Tests: Recent Labs    02/26/20 1154 07/06/20 1632  AST 17 12  ALT 18 9  BILITOT 0.4 0.3  PROT 7.0 6.3   CBC: Recent Labs    02/26/20 1154 07/06/20 1632  WBC 4.0 4.6  NEUTROABS 1,912 2,075  HGB 11.2* 9.5*  HCT 35.4 29.2*  MCV 91.2 89.3  PLT 230 207    Lipid Panel: Recent Labs    02/26/20 1154  HDL 49*      ASSESSMENT/PLAN:   1. Cellulitis of sacral region -  Instructed to keep daily treatment with Hydrofera blue -    Home health nurse for wound treatment, followed up with referral coordinator. - doxycycline (VIBRA-TABS) 100 MG tablet; Take 1 tablet (100 mg total) by mouth 2 (two) times daily for 14 days.  Dispense: 28 tablet; Refill: 0  -  DTR-in-law says that she give probiotic to patient daily    Time spent on non face to face visit:  20 minutes  The patient gave consent to this telephone visit. Explained to the patient the risk and privacy issue that was involved with this telephone call.   The patient was  advised to call back and ask for an in-person evaluation if the symptoms worsen or if the condition fails to improve.   Durenda Age, NP [employer] [phone]

## 2020-09-24 NOTE — Telephone Encounter (Signed)
Would recommend her taking a home test for COVID, likely she has gotten that too.  Please let us know if she is positive for COVID  Would make sure she is staying hydrated. Sitting up right, encourage deep breathing -recommended to take Vit C 1000 mg twice daily, Vit D 5000 units daily,  zinc 50 mg daily for 14 days -mucinex twice daily as needed for chest congestion

## 2020-09-24 NOTE — Telephone Encounter (Signed)
Spoke with [name], around the area looks swollen and was dripping blood (yesterday). No blood today from sacrum. [name] was notified today that one of the caregivers tested positive for covid today (at home test). Patient with a little congestion. Patient was given more tylenol yesterday. Today temp 99.6, last dose of tylenol was 8:30 pm.   Please send reply to [facility] in clinical intake

## 2020-09-24 NOTE — Progress Notes (Signed)
COMMUNITY PALLIATIVE CARE SW NOTE  PATIENT NAME: [name] DOB: 1935-07-29 MRN: [id number]  PRIMARY CARE PROVIDER: [name], NP  RESPONSIBLE PARTY:  Acct ID - Guarantor Home Phone Work Phone Relationship Acct Type  [id number] [name]  Self P/F     [address], [place], [place] [address]   Due to the COVID-19 crisis, this virtual check-in visit was done via telephone from my office and it was initiated and consent by this patient and or family   PLAN OF CARE and INTERVENTIONS:             1. GOALS OF CARE/ ADVANCE CARE PLANNING:  Goal is for patient to remain at home. Patient is a DNR.  2. SOCIAL/EMOTIONAL/SPIRITUAL ASSESSMENT/ INTERVENTIONS: SW completed a telephonic visit with patient's daughter-in-law/PCG-[name]. [name] reported that one of their sitters has tested positive COVID. For about a week, the patient had not been feeling well. She has been running a low grade temp,low blood pressure, appetite has declined and she has had cough and congestion. Patient and the entire family have taken a COVID test and the results have been negative. [name] expressed concern that patient's sacrum wound had opened and has been red, swollen and draining. She is concerned because she states that the wound is worse than she has ever seen before. She has contacted patient's physician and they have a telehealth visit this afternoon. [name] expects that patient will be referred to home health. Patient continues to have sitters throughout the day. [name] report that she is managing well with the sitter support. SW provided supportive counseling, active listening, assessment of needs and comfort of patient, coping of PCG, and reinforced support. Primary RN updated.  3.  PATIENT/CAREGIVER EDUCATION/ COPING:  [name] report that she is managing well with the support.  4. PERSONAL EMERGENCY PLAN: 911 can be activated for emergencies 5. COMMUNITY RESOURCES COORDINATION/ HEALTH  CARE NAVIGATION:  Patient has private caregiver 6. FINANCIAL/LEGAL CONCERNS/INTERVENTIONS:  None.     SOCIAL HX:  Social History   Tobacco Use  . Smoking status: Never Smoker  . Smokeless tobacco: Never Used  Substance Use Topics  . Alcohol use: No    CODE STATUS: DNR ADVANCED DIRECTIVES: No MOST FORM COMPLETE: Yes HOSPICE EDUCATION PROVIDED: No  PPS: Patient continues to rely on family and paid caregivers for all ADL's.    Duration of telephonic visit and documentation: [address] LCSW

## 2020-09-24 NOTE — Telephone Encounter (Signed)
[name], goh are scheduled for a virtual visit with your provider today.    Just as we do with appointments in the office, we must obtain your consent to participate.  Your consent will be active for this visit and any virtual visit you may have with one of our providers in the next 365 days.    If you have a MyChart account, I can also send a copy of this consent to you electronically.  All virtual visits are billed to your insurance company just like a traditional visit in the office.  As this is a virtual visit, video technology does not allow for your provider to perform a traditional examination.  This may limit your provider's ability to fully assess your condition.  If your provider identifies any concerns that need to be evaluated in person or the need to arrange testing such as labs, EKG, etc, we will make arrangements to do so.    Although advances in technology are sophisticated, we cannot ensure that it will always work on either your end or our end.  If the connection with a video visit is poor, we may have to switch to a telephone visit.  With either a video or telephone visit, we are not always able to ensure that we have a secure connection.   I need to obtain your verbal consent now.   Are you willing to proceed with your visit today?   [name] has provided verbal consent on 09/24/2020 for a virtual visit (video or telephone).   [place], [place] 09/24/2020  2:31 PM

## 2020-09-24 NOTE — Patient Instructions (Signed)

## 2020-09-24 NOTE — Telephone Encounter (Signed)
Patient has scheduled virtual visit today with Seth Bake, NP.

## 2020-09-28 ENCOUNTER — Other Ambulatory Visit: Payer: Self-pay | Admitting: *Deleted

## 2020-09-28 ENCOUNTER — Ambulatory Visit: Payer: Medicare Other | Admitting: Adult Health

## 2020-09-28 DIAGNOSIS — I10 Essential (primary) hypertension: Secondary | ICD-10-CM

## 2020-09-28 MED ORDER — CLONIDINE HCL 0.1 MG PO TABS: 0.1000 mg | ORAL_TABLET | Freq: Two times a day (BID) | ORAL | 1 refills | Status: DC

## 2020-09-28 MED ORDER — CLONIDINE HCL 0.1 MG PO TABS: 0.1000 mg | ORAL_TABLET | Freq: Two times a day (BID) | ORAL | 0 refills | Status: DC

## 2020-09-28 NOTE — Telephone Encounter (Signed)
Patient caregiver requested refill to be sent to Mail order and local pharmacy.

## 2020-09-29 DIAGNOSIS — E119 Type 2 diabetes mellitus without complications: Secondary | ICD-10-CM | POA: Diagnosis not present

## 2020-09-29 DIAGNOSIS — Z86711 Personal history of pulmonary embolism: Secondary | ICD-10-CM | POA: Diagnosis not present

## 2020-09-29 DIAGNOSIS — Z7984 Long term (current) use of oral hypoglycemic drugs: Secondary | ICD-10-CM | POA: Diagnosis not present

## 2020-09-29 DIAGNOSIS — E785 Hyperlipidemia, unspecified: Secondary | ICD-10-CM | POA: Diagnosis not present

## 2020-09-29 DIAGNOSIS — F0390 Unspecified dementia without behavioral disturbance: Secondary | ICD-10-CM | POA: Diagnosis not present

## 2020-09-29 DIAGNOSIS — I10 Essential (primary) hypertension: Secondary | ICD-10-CM | POA: Diagnosis not present

## 2020-09-29 DIAGNOSIS — L89320 Pressure ulcer of left buttock, unstageable: Secondary | ICD-10-CM | POA: Diagnosis not present

## 2020-09-29 DIAGNOSIS — Z86718 Personal history of other venous thrombosis and embolism: Secondary | ICD-10-CM | POA: Diagnosis not present

## 2020-09-29 DIAGNOSIS — Z8673 Personal history of transient ischemic attack (TIA), and cerebral infarction without residual deficits: Secondary | ICD-10-CM | POA: Diagnosis not present

## 2020-09-29 DIAGNOSIS — I890 Lymphedema, not elsewhere classified: Secondary | ICD-10-CM | POA: Diagnosis not present

## 2020-09-29 DIAGNOSIS — L89150 Pressure ulcer of sacral region, unstageable: Secondary | ICD-10-CM | POA: Diagnosis not present

## 2020-09-29 DIAGNOSIS — L03312 Cellulitis of back [any part except buttock]: Secondary | ICD-10-CM | POA: Diagnosis not present

## 2020-09-30 ENCOUNTER — Telehealth: Payer: Self-pay | Admitting: *Deleted

## 2020-09-30 ENCOUNTER — Encounter (HOSPITAL_COMMUNITY): Payer: Self-pay

## 2020-09-30 ENCOUNTER — Emergency Department (HOSPITAL_COMMUNITY): Payer: Medicare Other

## 2020-09-30 ENCOUNTER — Emergency Department (HOSPITAL_COMMUNITY)
Admission: EM | Admit: 2020-09-30 | Discharge: 2020-09-30 | Disposition: A | Discharge status: Home / Self Care | Payer: Medicare Other | Attending: Emergency Medicine | Admitting: Emergency Medicine

## 2020-09-30 DIAGNOSIS — R4182 Altered mental status, unspecified: Secondary | ICD-10-CM | POA: Diagnosis not present

## 2020-09-30 DIAGNOSIS — R001 Bradycardia, unspecified: Secondary | ICD-10-CM | POA: Diagnosis not present

## 2020-09-30 DIAGNOSIS — B9689 Other specified bacterial agents as the cause of diseases classified elsewhere: Secondary | ICD-10-CM | POA: Insufficient documentation

## 2020-09-30 DIAGNOSIS — I890 Lymphedema, not elsewhere classified: Secondary | ICD-10-CM | POA: Diagnosis not present

## 2020-09-30 DIAGNOSIS — Z7984 Long term (current) use of oral hypoglycemic drugs: Secondary | ICD-10-CM | POA: Insufficient documentation

## 2020-09-30 DIAGNOSIS — L03312 Cellulitis of back [any part except buttock]: Secondary | ICD-10-CM | POA: Diagnosis not present

## 2020-09-30 DIAGNOSIS — Z20822 Contact with and (suspected) exposure to covid-19: Secondary | ICD-10-CM | POA: Insufficient documentation

## 2020-09-30 DIAGNOSIS — I10 Essential (primary) hypertension: Secondary | ICD-10-CM | POA: Insufficient documentation

## 2020-09-30 DIAGNOSIS — Z7901 Long term (current) use of anticoagulants: Secondary | ICD-10-CM | POA: Diagnosis not present

## 2020-09-30 DIAGNOSIS — L89150 Pressure ulcer of sacral region, unstageable: Secondary | ICD-10-CM | POA: Diagnosis not present

## 2020-09-30 DIAGNOSIS — R0902 Hypoxemia: Secondary | ICD-10-CM | POA: Diagnosis not present

## 2020-09-30 DIAGNOSIS — L89320 Pressure ulcer of left buttock, unstageable: Secondary | ICD-10-CM | POA: Diagnosis not present

## 2020-09-30 DIAGNOSIS — Z79899 Other long term (current) drug therapy: Secondary | ICD-10-CM | POA: Diagnosis not present

## 2020-09-30 DIAGNOSIS — R000 Tachycardia, unspecified: Secondary | ICD-10-CM | POA: Diagnosis not present

## 2020-09-30 DIAGNOSIS — R531 Weakness: Secondary | ICD-10-CM | POA: Diagnosis not present

## 2020-09-30 DIAGNOSIS — F0390 Unspecified dementia without behavioral disturbance: Secondary | ICD-10-CM | POA: Insufficient documentation

## 2020-09-30 DIAGNOSIS — E119 Type 2 diabetes mellitus without complications: Secondary | ICD-10-CM | POA: Diagnosis not present

## 2020-09-30 DIAGNOSIS — N390 Urinary tract infection, site not specified: Secondary | ICD-10-CM | POA: Diagnosis not present

## 2020-09-30 DIAGNOSIS — E785 Hyperlipidemia, unspecified: Secondary | ICD-10-CM | POA: Diagnosis not present

## 2020-09-30 LAB — COMPREHENSIVE METABOLIC PANEL
ALT: 13 U/L (ref 0–44)
AST: 16 U/L (ref 15–41)
Albumin: 2.9 g/dL — ABNORMAL LOW (ref 3.5–5.0)
Alkaline Phosphatase: 58 U/L (ref 38–126)
Anion gap: 11 (ref 5–15)
BUN: 20 mg/dL (ref 8–23)
CO2: 24 mmol/L (ref 22–32)
Calcium: 9 mg/dL (ref 8.9–10.3)
Chloride: 98 mmol/L (ref 98–111)
Creatinine, Ser: 0.47 mg/dL (ref 0.44–1.00)
GFR, Estimated: >60 mL/min (ref >60)
Glucose, Bld: 173 mg/dL — ABNORMAL HIGH (ref 70–99)
Potassium: 4.4 mmol/L (ref 3.5–5.1)
Sodium: 133 mmol/L — ABNORMAL LOW (ref 135–145)
Total Bilirubin: 0.4 mg/dL (ref 0.3–1.2)
Total Protein: 6.8 g/dL (ref 6.5–8.1)

## 2020-09-30 LAB — URINALYSIS, ROUTINE W REFLEX MICROSCOPIC
Bilirubin Urine: NEGATIVE
Glucose, UA: NEGATIVE mg/dL
Ketones, ur: NEGATIVE mg/dL
Nitrite: NEGATIVE
Protein, ur: NEGATIVE mg/dL
Specific Gravity, Urine: 1.01 (ref 1.005–1.030)
WBC, UA: >50 WBC/hpf — ABNORMAL HIGH (ref 0–5)
pH: 5 (ref 5.0–8.0)

## 2020-09-30 LAB — CBC WITH DIFFERENTIAL/PLATELET
Abs Immature Granulocytes: 0.05 10*3/uL (ref 0.00–0.07)
Basophils Absolute: 0 10*3/uL (ref 0.0–0.1)
Basophils Relative: 0 %
Eosinophils Absolute: 0 10*3/uL (ref 0.0–0.5)
Eosinophils Relative: 0 %
HCT: 31.6 % — ABNORMAL LOW (ref 36.0–46.0)
Hemoglobin: 9.6 g/dL — ABNORMAL LOW (ref 12.0–15.0)
Immature Granulocytes: 1 %
Lymphocytes Relative: 13 %
Lymphs Abs: 1.2 10*3/uL (ref 0.7–4.0)
MCH: 28.2 pg (ref 26.0–34.0)
MCHC: 30.4 g/dL (ref 30.0–36.0)
MCV: 92.7 fL (ref 80.0–100.0)
Monocytes Absolute: 0.8 10*3/uL (ref 0.1–1.0)
Monocytes Relative: 9 %
Neutro Abs: 7.2 10*3/uL (ref 1.7–7.7)
Neutrophils Relative %: 77 %
Platelets: 331 10*3/uL (ref 150–400)
RBC: 3.41 MIL/uL — ABNORMAL LOW (ref 3.87–5.11)
RDW: 13 % (ref 11.5–15.5)
WBC: 9.3 10*3/uL (ref 4.0–10.5)
nRBC: 0 % (ref 0.0–0.2)

## 2020-09-30 LAB — CBG MONITORING, ED: Glucose-Capillary: 163 mg/dL — ABNORMAL HIGH (ref 70–99)

## 2020-09-30 LAB — RESP PANEL BY RT-PCR (FLU A&B, COVID) ARPGX2
Influenza A by PCR: NEGATIVE
Influenza B by PCR: NEGATIVE
SARS Coronavirus 2 by RT PCR: NEGATIVE

## 2020-09-30 MED ORDER — SODIUM CHLORIDE 0.9 % IV SOLN
1.0000 g | Freq: Once | INTRAVENOUS | Status: AC
  Administered 2020-09-30: 1 g via INTRAVENOUS
  Filled 2020-09-30: qty 10

## 2020-09-30 MED ORDER — SODIUM CHLORIDE 0.9 % IV BOLUS
500.0000 mL | Freq: Once | INTRAVENOUS | Status: AC
  Administered 2020-09-30: 500 mL via INTRAVENOUS

## 2020-09-30 MED ORDER — CEPHALEXIN 500 MG PO CAPS: 500.0000 mg | ORAL_CAPSULE | Freq: Four times a day (QID) | ORAL | 0 refills | Status: DC

## 2020-09-30 MED ORDER — CLONIDINE HCL 0.1 MG PO TABS
0.1000 mg | ORAL_TABLET | Freq: Once | ORAL | Status: AC
  Administered 2020-09-30: 0.1 mg via ORAL
  Filled 2020-09-30: qty 1

## 2020-09-30 NOTE — Telephone Encounter (Signed)
[name] with [facility] called and stated that she just wants to keep you updated:  Patient was seen yesterday regarding the sacral Wound. Stated that it is a nasty wound with a 13x10 1/2 abscess under the wound. Wound is Narcotic. Nurse is concerns with Osteomyelitis. Patient's Blood Sugar and Blood Pressures are running high. BP 160/100 Caregiver is not giving blood pressure medication correctly, stated that she gives as needed, stated that it is always running low.  Nurse has tried to convince the family to go Hospice but patient's family is against it.  Patient is taking her Doxycycline 100mg  twice daily as prescribed for 14 days. Still running a low grade fever.   Nurse is referring patient to [facility] and will be following up with her twice a week.   FYI (forwarded to Amy due to Janett Billow out of office)

## 2020-09-30 NOTE — Telephone Encounter (Signed)
Patient caregiver notified. Stated that she is going to keep a log of patient's blood pressures.

## 2020-09-30 NOTE — ED Notes (Addendum)
Caregiver at bedside.   Per caregiver patient usually has high blood pressure over 756 systolic in the morning and normal for her.   Caregiver has a log of all blood pressures from today and medications.   EDP made aware of high blood pressure and ok with bp right now.

## 2020-09-30 NOTE — ED Notes (Signed)
RN gave update to pt daughter over the phone. Confirmed she will come to transport pt.

## 2020-09-30 NOTE — Telephone Encounter (Signed)
Thank you  for update

## 2020-09-30 NOTE — ED Notes (Signed)
Pt daughter verbalized understanding of d/c, medication, and follow up care. Staff assisted with transfer of pt to wheelchair and POV

## 2020-09-30 NOTE — ED Provider Notes (Signed)
[place] DEPT Provider Note   CSN: [id number] Arrival date & time: 09/30/20  1714     History Chief Complaint  Patient presents with  . Altered Mental Status    [name] is a 85 y.o. female.  85 year old female with prior medical history as detailed below presents for evaluation.  Patient with decreased interactiveness over the last week.  Patient with decreased p.o. intake as well.  Patient arrives from home.  She has valid DNR/DNI.  Level 5 caveat.  Patient is unable to provide significant history given her advanced dementia.  The history is provided by the patient and medical records.  Altered Mental Status Presenting symptoms: lethargy   Severity:  Moderate Most recent episode:  More than 2 days ago Episode history:  Single Duration:  1 week Timing:  Constant Progression:  Worsening Chronicity:  New      Past Medical History:  Diagnosis Date  . Dementia ([place])   . Diabetes mellitus type II 03/21/2011  . DVT (deep venous thrombosis) ([place]) 03/21/2011  . Hyperlipidemia 03/21/2011  . Hypertension 03/21/2011  . Lymphedema of leg 03/21/2011  . Pulmonary embolism ([place]) 03/21/2011  . Stroke [facility])     Patient Active Problem List   Diagnosis Date Noted  . Pressure injury of skin 05/04/2019  . Skin ulcer of sacrum with fat layer exposed ([place]) 07/22/2017  . Lipoma of left lower extremity 07/22/2017  . Estrogen deficiency 07/22/2017  . Hyperlipidemia associated with type 2 diabetes mellitus ([place]) 07/22/2017  . History of TIA (transient ischemic attack) 01/10/2017  . Atrial septal aneurysm 11/22/2016  . Bilateral lower extremity edema 11/22/2016  . Iron deficiency anemia due to chronic blood loss 11/16/2016  . TIA (transient ischemic attack) 10/05/2016  . Current use of long term anticoagulation 08/24/2016  . Metabolic encephalopathy 05/17/3233  . Senile dementia without behavioral disturbance ([place]) 08/23/2016  . Acute CVA  (cerebrovascular accident) ([place])   . Left-sided weakness 05/11/2016  . Dysarthria 05/11/2016  . Acute left-sided weakness 05/11/2016  . History of pulmonary embolism 03/21/2011  . History of DVT (deep vein thrombosis) 03/21/2011  . Type 2 diabetes mellitus with diabetic polyneuropathy, without long-term current use of insulin ([place]) 03/21/2011  . Hyperlipidemia 03/21/2011  . Hypertension 03/21/2011    Past Surgical History:  Procedure Laterality Date  . CHOLECYSTECTOMY  2015  . SKIN TAG REMOVAL  07/25/2016  . VAGINAL HYSTERECTOMY     unknown of date  . VASCULAR SURGERY       OB History    Gravida  3   Para  3   Term  3   Preterm      AB      Living  3     SAB      IAB      Ectopic      Multiple      Live Births  3           Family History  Problem Relation Age of Onset  . Heart attack Mother   . COPD Sister   . Stroke Sister   . Bipolar disorder Daughter     Social History   Tobacco Use  . Smoking status: Never Smoker  . Smokeless tobacco: Never Used  Vaping Use  . Vaping Use: Never used  Substance Use Topics  . Alcohol use: No  . Drug use: No    Home Medications Prior to Admission medications   Medication Sig Start Date End Date Taking?  Authorizing Provider  A&D OINT Apply 1 application topically as needed.     [provider]  Accu-Chek Softclix Lancets lancets 1 each by Other route 2 (two) times daily. Use as instructed Dx: E11.9 09/01/20   [name], NP  acetaminophen (TYLENOL) 500 MG tablet Take 500 mg by mouth every 8 (eight) hours as needed (for general pain).     [provider]  atorvastatin (LIPITOR) 40 MG tablet Take 1 tablet (40 mg total) by mouth daily at 6 PM. 09/01/20   Dewaine Oats, Carlos American, NP  Cholecalciferol (VITAMIN D-3) 125 MCG (5000 UT) TABS Take 5,000 Units by mouth daily.    [provider]  cloNIDine (CATAPRES) 0.1 MG tablet Take 1 tablet (0.1 mg total) by mouth 2 (two) times daily. May  take an additional tablet if needed SBP >170 09/28/20   [name], NP  doxycycline (VIBRA-TABS) 100 MG tablet Take 1 tablet (100 mg total) by mouth 2 (two) times daily for 14 days. 09/24/20 10/08/20  Medina-Vargas, Monina C, NP  gabapentin (NEURONTIN) 300 MG capsule Take 1 capsule (300 mg total) by mouth 2 (two) times daily. 09/01/20   [name], NP  glucose blood (ACCU-CHEK AVIVA PLUS) test strip USE STRIP(S) TO TEST TWICE A DAY - (KEEP UNUSED STRIPS IN ORIGINAL SEALED CONTAINER BETWEEN USES) 09/01/20   [name], NP  losartan (COZAAR) 25 MG tablet Take one tablet by mouth once daily, take along with 50mg  to equal 75mg  09/01/20   [name], NP  losartan (COZAAR) 50 MG tablet Take one tablet by mouth once daily, along with 25mg  to equal 75mg  09/01/20   [name], NP  metFORMIN (GLUCOPHAGE) 500 MG tablet Take 1 tablet (500 mg total) by mouth 2 (two) times daily with a meal. 09/01/20   [name], NP  NON FORMULARY Take 1 capsule by mouth See admin instructions. Metagenics - UltraFlora Balance Daily Probiotic Immune Support* capsules- Take 1 capsule by mouth once a day    [provider]  polyethylene glycol (MIRALAX / GLYCOLAX) packet Take 17 g by mouth daily as needed for mild constipation (MIX AND DRINK).     [provider]  Rivaroxaban (XARELTO) 15 MG TABS tablet Take one tablet by mouth once daily with supper 09/01/20   [name], NP    Allergies    Patient has no known allergies.  Review of Systems   Review of Systems  All other systems reviewed and are negative.   Physical Exam Updated Vital Signs Temp 99.8 F (37.7 C) (Rectal)   Physical Exam Vitals and nursing note reviewed.  Constitutional:      General: She is not in acute distress.    Appearance: Normal appearance. She is well-developed.  HENT:     Head: Normocephalic and atraumatic.  Eyes:     Conjunctiva/sclera: Conjunctivae normal.     Pupils:  Pupils are equal, round, and reactive to light.  Cardiovascular:     Rate and Rhythm: Normal rate and regular rhythm.     Heart sounds: Normal heart sounds.  Pulmonary:     Effort: Pulmonary effort is normal. No respiratory distress.     Breath sounds: Normal breath sounds.  Abdominal:     General: There is no distension.     Palpations: Abdomen is soft.     Tenderness: There is no abdominal tenderness.  Musculoskeletal:        General: No deformity. Normal range of  motion.     Cervical back: Normal range of motion and neck supple.  Skin:    General: Skin is warm and dry.  Neurological:     Mental Status: She is alert. Mental status is at baseline.     ED Results / Procedures / Treatments   Labs (all labs ordered are listed, but only abnormal results are displayed) Labs Reviewed  URINALYSIS, ROUTINE W REFLEX MICROSCOPIC - Abnormal; Notable for the following components:      Result Value   APPearance CLOUDY (*)    Hgb urine dipstick SMALL (*)    Leukocytes,Ua LARGE (*)    WBC, UA >50 (*)    Bacteria, UA RARE (*)    All other components within normal limits  COMPREHENSIVE METABOLIC PANEL - Abnormal; Notable for the following components:   Sodium 133 (*)    Glucose, Bld 173 (*)    Albumin 2.9 (*)    All other components within normal limits  CBC WITH DIFFERENTIAL/PLATELET - Abnormal; Notable for the following components:   RBC 3.41 (*)    Hemoglobin 9.6 (*)    HCT 31.6 (*)    All other components within normal limits  CBG MONITORING, ED - Abnormal; Notable for the following components:   Glucose-Capillary 163 (*)    All other components within normal limits  RESP PANEL BY RT-PCR (FLU A&B, COVID) [id number]  URINE CULTURE    EKG EKG Interpretation  Date/Time:  Wednesday Sep 30 2020 18:31:42 EDT Ventricular Rate:  89 PR Interval:  65 QRS Duration: 81 QT Interval:  378 QTC Calculation: 460 R Axis:   46 Text Interpretation: Sinus rhythm Atrial premature complex Short  PR interval Abnormal R-wave progression, early transition Confirmed by Dene Gentry [phone]) on 09/30/2020 6:35:38 PM   Radiology DG Chest Port 1 View  Result Date: 09/30/2020 CLINICAL DATA:  Weakness.  Altered mental status. EXAM: PORTABLE CHEST 1 VIEW COMPARISON:  May 04, 2019. FINDINGS: Trachea midline. Cardiomediastinal contours and hilar structures are stable and normal. Lung volumes are slightly diminished with airspace disease/subtle opacity over the RIGHT hemidiaphragm. No sign of effusion. Skin folds project over the RIGHT chest as on previous imaging. On limited assessment no acute skeletal process. IMPRESSION: Low lung volumes with airspace disease/subtle opacity over the RIGHT hemidiaphragm, likely atelectasis. Electronically Signed   By: Zetta Bills M.D.   On: 09/30/2020 18:13    Procedures Procedures   Medications Ordered in ED Medications  sodium chloride 0.9 % bolus 500 mL (has no administration in time range)    ED Course  I have reviewed the triage vital signs and the nursing notes.  Pertinent labs & imaging results that were available during my care of the patient were reviewed by me and considered in my medical decision making (see chart for details).    MDM Rules/Calculators/A&P                          MDM  MSE complete  [name] was evaluated in Emergency Department on 09/30/2020 for the symptoms described in the history of present illness. She was evaluated in the context of the global COVID-19 pandemic, which necessitated consideration that the patient might be at risk for infection with the SARS-CoV-2 virus that causes COVID-19. Institutional protocols and algorithms that pertain to the evaluation of patients at risk for COVID-19 are in a state of rapid change based on information released by regulatory bodies including the CDC and federal  and state organizations. These policies and algorithms were followed during the patient's care in the  ED.  Patient initially brought in for concern over possible infection and possible dehydration.  Work-up demonstrates presence of UTI.  Patient otherwise is without significant findings on work-up.  She does appear to be improved with administration of IV fluids.  Patient's caregiver at bedside understands work-up results.  They agree with plan for discharge home with oral antibiotics at home.    Importance of close follow-up is stressed.  Strict return precautions given and understood.   Final Clinical Impression(s) / ED Diagnoses Final diagnoses:  Urinary tract infection without hematuria, site unspecified    Rx / DC Orders ED Discharge Orders         Ordered    cephALEXin (KEFLEX) 500 MG capsule  4 times daily        09/30/20 1927           Valarie Merino, MD 09/30/20 2014

## 2020-09-30 NOTE — Telephone Encounter (Signed)
Please check blood pressure in morning and evening before giving medications. If SBP< 110 please hold medication.

## 2020-09-30 NOTE — Telephone Encounter (Signed)
Patient Caregiver called and stated that patient's blood pressure has been running on the low side. Stated that yesterday patient's blood pressure was 112/62 and today it was 115/60, so she has not been giving patient her blood pressure medication because she doesn't want to bottom out.  Yesterday afternoon when the nurse took it it was high 160/100.  Caregiver is wanting your opinion on how to give the BP medication to patient.  Please Advise. (forwarded to Amy due to Janett Billow out of office)

## 2020-09-30 NOTE — Discharge Instructions (Addendum)
Return for any problem.   Take Keflex as prescribed to treat UTI.

## 2020-09-30 NOTE — ED Triage Notes (Signed)
Pt arrived via EMS, from home, AMS x1 week. Normally able to communicate, states her name, communicate needs. Unable to communicate or answer questions of any kind at this time.

## 2020-10-01 ENCOUNTER — Telehealth: Payer: Self-pay | Admitting: *Deleted

## 2020-10-01 ENCOUNTER — Ambulatory Visit: Payer: Self-pay | Admitting: Adult Health

## 2020-10-01 NOTE — Telephone Encounter (Signed)
Patient Caregiver scheduled an appointment for today with Monina at 3:30

## 2020-10-01 NOTE — Telephone Encounter (Signed)
Please see if they can schedule this afternoon with Monina to be seen.

## 2020-10-01 NOTE — Telephone Encounter (Signed)
Patient Caregiver, [name], called and stated that the [facility] called yesterday with an appointment of June 17.  Caregiver stated that the wound is bad and wonders if patient could wait till then. Stated that the office placed patient on the cancellation list and wants to know if the Provider can call the office and speak with their Provider to obtain a sooner appointment.   [facility] is going out twice a week for wound management. They are the ones that placed an appointment for the [place].   Stated that patient went to the ER last night but they sent her home with a Rx for a UTI.   Please Advise. (forwarded to Amy due to Janett Billow out of office)

## 2020-10-02 ENCOUNTER — Other Ambulatory Visit: Payer: Self-pay

## 2020-10-02 ENCOUNTER — Encounter (HOSPITAL_BASED_OUTPATIENT_CLINIC_OR_DEPARTMENT_OTHER): Payer: Medicare Other | Attending: Internal Medicine | Admitting: Internal Medicine

## 2020-10-02 DIAGNOSIS — Z86718 Personal history of other venous thrombosis and embolism: Secondary | ICD-10-CM | POA: Diagnosis not present

## 2020-10-02 DIAGNOSIS — L89310 Pressure ulcer of right buttock, unstageable: Secondary | ICD-10-CM

## 2020-10-02 DIAGNOSIS — I10 Essential (primary) hypertension: Secondary | ICD-10-CM | POA: Insufficient documentation

## 2020-10-02 DIAGNOSIS — F0390 Unspecified dementia without behavioral disturbance: Secondary | ICD-10-CM | POA: Insufficient documentation

## 2020-10-02 DIAGNOSIS — E11622 Type 2 diabetes mellitus with other skin ulcer: Secondary | ICD-10-CM | POA: Insufficient documentation

## 2020-10-02 NOTE — Progress Notes (Addendum)
[name] ([id number]) , Visit Report for 10/02/2020 Allergy List Details Patient Name: Date of Service: [name], [name] 10/02/2020 9:00 [place] Record Number: [id number] Patient Account Number: [id number] Date of Birth/Sex: Treating RN: 07-10-1935 (85 y.o. [name] Primary Care Lenzy Kerschner: [name] Other Clinician: Referring Evon Lopezperez: Treating Glenys Snader/Extender: Jennell Corner Weeks in Treatment: 0 Allergies Active Allergies No Known Allergies Allergy Notes Electronic Signature(s) Signed: 10/02/2020 1:55:00 PM By: Baruch Gouty RN, BSN Entered By: Baruch Gouty on 10/02/2020 09:16:31 -------------------------------------------------------------------------------- Arrival Information Details Patient Name: Date of Service: [name] 10/02/2020 9:00 A M Medical Record Number: [id number] Patient Account Number: [id number] Date of Birth/Sex: Treating RN: 08-25-1935 (85 y.o. [name] Primary Care Sintia Mckissic: [name] Other Clinician: Referring Charly Hunton: Treating Mannix Kroeker/Extender: Rolanda Lundborg in Treatment: 0 Visit Information Patient Arrived: Wheel Chair Arrival Time: 09:11 Accompanied By: Charolett Bumpers in law Transfer Assistance: Manual Patient Identification Verified: Yes Secondary Verification Process Completed: Yes Patient Requires Transmission-Based Precautions: No Patient Has Alerts: No History Since Last Visit Has Dressing in Place as Prescribed: Yes Electronic Signature(s) Signed: 10/02/2020 1:55:00 PM By: Baruch Gouty RN, BSN Entered By: Baruch Gouty on 10/02/2020 09:15:32 -------------------------------------------------------------------------------- Clinic Level of Care Assessment Details Patient Name: Date of Service: [name] 10/02/2020 9:00 [place] Record Number: [id number] Patient Account Number: [id number] Date of Birth/Sex: Treating RN: [birth year] (85 y.o. [name] Primary Care Ivanell Deshotel: [name] Other Clinician: Referring Leyda Vanderwerf: Treating Prestyn Stanco/Extender: Rolanda Lundborg in Treatment: 0 Clinic Level of Care Assessment Items TOOL 1 Quantity Score X- 1 0 Use when EandM and Procedure is performed on INITIAL visit ASSESSMENTS - Nursing Assessment / Reassessment X- 1 20 General Physical Exam (combine w/ comprehensive assessment (listed just below) when performed on new pt. evals) X- 1 25 Comprehensive Assessment (HX, ROS, Risk Assessments, Wounds Hx, etc.) ASSESSMENTS - Wound and Skin Assessment / Reassessment []  - 0 Dermatologic / Skin Assessment (not related to wound area) ASSESSMENTS - Ostomy and/or Continence Assessment and Care []  - 0 Incontinence Assessment and Management []  - 0 Ostomy Care Assessment and Management (repouching, etc.) PROCESS - Coordination of Care []  - 0 Simple Patient / Family Education for ongoing care X- 1 20 Complex (extensive) Patient / Family Education for ongoing care X- 1 10 Staff obtains Programmer, systems, Records, T Results / Process Orders est X- 1 10 Staff telephones HHA, Nursing Homes / Clarify orders / etc []  - 0 Routine Transfer to another Facility (non-emergent condition) []  - 0 Routine Hospital Admission (non-emergent condition) []  - 0 New Admissions / Biomedical engineer / Ordering NPWT Apligraf, etc. , []  - 0 Emergency Hospital Admission (emergent condition) PROCESS - Special Needs []  - 0 Pediatric / Minor Patient Management []  - 0 Isolation Patient Management []  - 0 Hearing / Language / Visual special needs []  - 0 Assessment of Community assistance (transportation, D/C planning, etc.) []  - 0 Additional assistance / Altered mentation []  - 0 Support Surface(s) Assessment (bed, cushion, seat, etc.) INTERVENTIONS - Miscellaneous []  - 0 External ear exam []  - 0 Patient Transfer (multiple staff / Civil Service fast streamer / Similar devices) []  -  0 Simple Staple / Suture removal (25 or less) []  - 0 Complex Staple / Suture removal (26 or more) []  - 0 Hypo/Hyperglycemic Management (do not check if billed separately) []  - 0 Ankle / Brachial Index (ABI) - do not check if billed separately Has the patient been seen at the hospital within the  last three years: Yes Total Score: 85 Level Of Care: New/Established - Level 3 Electronic Signature(s) Signed: 10/02/2020 1:31:18 PM By: Lorrin Jackson Entered By: Lorrin Jackson on 10/02/2020 10:34:23 -------------------------------------------------------------------------------- Lower Extremity Assessment Details Patient Name: Date of Service: [name], [name] 10/02/2020 9:00 A M Medical Record Number: [id number] Patient Account Number: [id number] Date of Birth/Sex: Treating RN: 02-Dec-1935 (85 y.o. [name] Primary Care Byard Carranza: [name] Other Clinician: Referring Skai Lickteig: Treating Reighn Kaplan/Extender: Rolanda Lundborg in Treatment: 0 Electronic Signature(s) Signed: 10/02/2020 1:55:00 PM By: Baruch Gouty RN, BSN Entered By: Baruch Gouty on 10/02/2020 09:33:44 -------------------------------------------------------------------------------- Multi Wound Chart Details Patient Name: Date of Service: [name] 10/02/2020 9:00 A M Medical Record Number: [id number] Patient Account Number: [id number] Date of Birth/Sex: Treating RN: Mar 14, 1936 (85 y.o. [name] Primary Care Manford Sprong: [name] Other Clinician: Referring Karo Rog: Treating Sara Selvidge/Extender: Rolanda Lundborg in Treatment: 0 Vital Signs Height(in): 68 Capillary Blood Glucose(mg/dl): 138 Weight(lbs): 110 Pulse(bpm): 106 Body Mass Index(BMI): 17 Blood Pressure(mmHg): 162/80 Temperature(F): 97.4 Respiratory Rate(breaths/min): 18 Photos: [N/A:N/A] Left Gluteus N/A N/A Wound Location: Pressure Injury N/A N/A Wounding  Event: Pressure Ulcer N/A N/A Primary Etiology: Anemia, Deep Vein Thrombosis, N/A N/A Comorbid History: Hypertension, Type II Diabetes, Dementia 09/18/2020 N/A N/A Date Acquired: 0 N/A N/A Weeks of Treatment: Open N/A N/A Wound Status: 6.8x7.8x0.1 N/A N/A Measurements L x W x D (cm) 41.658 N/A N/A A (cm) : rea 4.166 N/A N/A Volume (cm) : 0.00% N/A N/A % Reduction in A rea: 0.00% N/A N/A % Reduction in Volume: Unstageable/Unclassified N/A N/A Classification: Medium N/A N/A Exudate A mount: Serosanguineous N/A N/A Exudate Type: red, brown N/A N/A Exudate Color: Yes N/A N/A Foul Odor A Cleansing: fter No N/A N/A Odor A nticipated Due to Product Use: Flat and Intact N/A N/A Wound Margin: None Present (0%) N/A N/A Granulation Amount: Large (67-100%) N/A N/A Necrotic Amount: Eschar N/A N/A Necrotic Tissue: Fat Layer (Subcutaneous Tissue): Yes N/A N/A Exposed Structures: Fascia: No Tendon: No Muscle: No Joint: No Bone: No None N/A N/A Epithelialization: Debridement - Selective/Open Wound N/A N/A Debridement: Pre-procedure Verification/Time Out 10:10 N/A N/A Taken: Necrotic/Eschar N/A N/A Tissue Debrided: Non-Viable Tissue N/A N/A Level: 30 N/A N/A Debridement A (sq cm): rea Blade N/A N/A Instrument: None N/A N/A Bleeding: Procedure was tolerated well N/A N/A Debridement Treatment Response: 6.8x7.8x0.1 N/A N/A Post Debridement Measurements L x W x D (cm) 4.166 N/A N/A Post Debridement Volume: (cm) Unstageable/Unclassified N/A N/A Post Debridement Stage: Debridement N/A N/A Procedures Performed: Treatment Notes Electronic Signature(s) Signed: 10/07/2020 5:17:53 PM By: Kalman Shan DO Signed: 10/07/2020 5:37:40 PM By: Lorrin Jackson Entered By: Kalman Shan on 10/07/2020 17:12:54 -------------------------------------------------------------------------------- Multi-Disciplinary Care Plan Details Patient Name: Date of Service: [name] 10/02/2020 9:00 A M Medical Record Number: [id number] Patient Account Number: [id number] Date of Birth/Sex: Treating RN: 1935-09-22 (85 y.o. [name] Primary Care Cosby Proby: [name] Other Clinician: Referring Dariel Betzer: Treating Jhoana Upham/Extender: Rolanda Lundborg in Treatment: 0 Active Inactive Pressure Nursing Diagnoses: Knowledge deficit related to management of pressures ulcers Goals: Patient/caregiver will verbalize understanding of pressure ulcer management Date Initiated: 10/02/2020 Target Resolution Date: 10/30/2020 Goal Status: Active Interventions: Assess: immobility, friction, shearing, incontinence upon admission and as needed Assess offloading mechanisms upon admission and as needed Assess potential for pressure ulcer upon admission and as needed Provide education on pressure ulcers Notes: Wound/Skin Impairment Nursing Diagnoses: Impaired tissue integrity Goals: Patient/caregiver will verbalize understanding of skin care  regimen Date Initiated: 10/02/2020 Target Resolution Date: 10/30/2020 Goal Status: Active Ulcer/skin breakdown will have a volume reduction of 30% by week 4 Date Initiated: 10/02/2020 Target Resolution Date: 10/30/2020 Goal Status: Active Interventions: Assess patient/caregiver ability to obtain necessary supplies Assess patient/caregiver ability to perform ulcer/skin care regimen upon admission and as needed Assess ulceration(s) every visit Provide education on ulcer and skin care Treatment Activities: Skin care regimen initiated : 10/02/2020 Topical wound management initiated : 10/02/2020 Notes: Electronic Signature(s) Signed: 10/02/2020 1:31:18 PM By: Lorrin Jackson Entered By: Lorrin Jackson on 10/02/2020 09:55:15 -------------------------------------------------------------------------------- Pain Assessment Details Patient Name: Date of Service: [name], [name] 10/02/2020 9:00 [place]  Record Number: [id number] Patient Account Number: [id number] Date of Birth/Sex: Treating RN: 1935/06/15 (85 y.o. [name] Primary Care Kalenna Millett: [name] Other Clinician: Referring Landry Lookingbill: Treating Modena Bellemare/Extender: Rolanda Lundborg in Treatment: 0 Active Problems Location of Pain Severity and Description of Pain Patient Has Paino Yes Site Locations Pain Location: Pain in Ulcers With Dressing Change: Yes Duration of the Pain. Constant / Intermittento Intermittent Rate the pain. Current Pain Level: 0 Worst Pain Level: 5 Character of Pain Describe the Pain: Aching, Tender Pain Management and Medication Current Pain Management: Other: reposition Is the Current Pain Management Adequate: Adequate How does your wound impact your activities of daily livingo Sleep: No Bathing: No Appetite: No Relationship With Others: No Bladder Continence: No Emotions: No Bowel Continence: No Hobbies: No Toileting: No Dressing: No Electronic Signature(s) Signed: 10/02/2020 1:55:00 PM By: Baruch Gouty RN, BSN Entered By: Baruch Gouty on 10/02/2020 09:36:40 -------------------------------------------------------------------------------- Patient/Caregiver Education Details Patient Name: Date of Service: [name] 5/27/2022andnbsp9:00 [place] Record Number: [id number] Patient Account Number: [id number] Date of Birth/Gender: Treating RN: [birth year] (85 y.o. [name] Primary Care Physician: [name] Other Clinician: Referring Physician: Treating Physician/Extender: Rolanda Lundborg in Treatment: 0 Education Assessment Education Provided To: Patient and Caregiver Education Topics Provided Pressure: Methods: Explain/Verbal, Printed Responses: State content correctly Wound/Skin Impairment: Methods: Explain/Verbal, Printed Responses: State content correctly Electronic Signature(s) Signed:  10/02/2020 1:31:18 PM By: Lorrin Jackson Entered By: Lorrin Jackson on 10/02/2020 10:15:34 -------------------------------------------------------------------------------- Wound Assessment Details Patient Name: Date of Service: [name], [name] 10/02/2020 9:00 A M Medical Record Number: [id number] Patient Account Number: [id number] Date of Birth/Sex: Treating RN: April 02, 1936 (85 y.o. [name] Primary Care Darlette Dubow: [name] Other Clinician: Referring Kairie Vangieson: Treating Elyanna Wallick/Extender: Jennell Corner Weeks in Treatment: 0 Wound Status Wound Number: 2 Primary Pressure Ulcer Etiology: Wound Location: Left Gluteus Wound Status: Open Wounding Event: Pressure Injury Comorbid Anemia, Deep Vein Thrombosis, Hypertension, Type II Date Acquired: 09/18/2020 History: Diabetes, Dementia Weeks Of Treatment: 0 Clustered Wound: No Photos Wound Measurements Length: (cm) 6.8 Width: (cm) 7.8 Depth: (cm) 0.1 Area: (cm) 41.658 Volume: (cm) 4.166 % Reduction in Area: 0% % Reduction in Volume: 0% Epithelialization: None Tunneling: No Undermining: No Wound Description Classification: Unstageable/Unclassified Wound Margin: Flat and Intact Exudate Amount: Medium Exudate Type: Serosanguineous Exudate Color: red, brown Foul Odor After Cleansing: Yes Due to Product Use: No Slough/Fibrino Yes Wound Bed Granulation Amount: None Present (0%) Exposed Structure Necrotic Amount: Large (67-100%) Fascia Exposed: No Necrotic Quality: Eschar Fat Layer (Subcutaneous Tissue) Exposed: Yes Tendon Exposed: No Muscle Exposed: No Joint Exposed: No Bone Exposed: No Electronic Signature(s) Signed: 10/06/2020 5:24:49 PM By: Sandre Kitty Signed: 10/07/2020 6:06:42 PM By: Baruch Gouty RN, BSN Previous Signature: 10/02/2020 1:55:00 PM Version By: Baruch Gouty RN, BSN Entered By: Sandre Kitty on  10/06/2020  11:27:01 -------------------------------------------------------------------------------- Vitals Details Patient Name: Date of Service: [name], [name] 10/02/2020 9:00 [place] Record Number: [id number] Patient Account Number: [id number] Date of Birth/Sex: Treating RN: 09-15-1935 (85 y.o. [name] Primary Care Vinette Crites: [name] Other Clinician: Referring Luxe Cuadros: Treating Maicie Vanderloop/Extender: Rolanda Lundborg in Treatment: 0 Vital Signs Time Taken: 09:15 Temperature (F): 97.4 Height (in): 68 Pulse (bpm): 106 Source: Stated Respiratory Rate (breaths/min): 18 Weight (lbs): 110 Blood Pressure (mmHg): 162/80 Body Mass Index (BMI): 16.7 Capillary Blood Glucose (mg/dl): 138 Reference Range: 80 - 120 mg / dl Notes glucose per family report this am Electronic Signature(s) Signed: 10/02/2020 1:55:00 PM By: Baruch Gouty RN, BSN Entered By: Baruch Gouty on 10/02/2020 09:33:33

## 2020-10-02 NOTE — Progress Notes (Signed)
[name] ([id number]) , Visit Report for 10/02/2020 Abuse/Suicide Risk Screen Details Patient Name: Date of Service: [name], [name] 10/02/2020 9:00 [place] Record Number: [id number] Patient Account Number: [id number] Date of Birth/Sex: Treating RN: 11-Jul-1935 (85 y.o. [name] [name]: [name] Other Clinician: Referring [name]: Treating [name]/Extender: [name] in Treatment: 0 Abuse/Suicide Risk Screen Items Answer ABUSE RISK SCREEN: Has anyone close to you tried to hurt or harm you recentlyo No Do you feel uncomfortable with anyone in your familyo No Has anyone forced you do things that you didnt want to doo No Electronic Signature(s) Signed: 10/02/2020 1:55:00 PM By: Baruch Gouty RN, BSN Entered By: Baruch Gouty on 10/02/2020 08:65:78 -------------------------------------------------------------------------------- Activities of Daily Living Details Patient Name: Date of Service: [name], [name] 10/02/2020 9:00 [place] Record Number: [id number] Patient Account Number: [id number] Date of Birth/Sex: Treating RN: 05-21-1935 (85 y.o. [name] [name]: [name] Other Clinician: Referring [name]: Treating [name]/Extender: [name] in Treatment: 0 Activities of Daily Living Items Answer Activities of Daily Living (Please select one for each item) Drive Automobile Not Able T Medications ake Need Assistance Use T elephone Not Able Contreras for Appearance Not Able Use T oilet Need Assistance Bath / Shower Need Assistance Dress Self Need Assistance Feed Self Need Assistance Walk Not Able Get In / Out Bed Need Assistance Housework Not Able Prepare Meals Not Able Handle Money Not Able Shop for Self Not Able Electronic Signature(s) Signed: 10/02/2020 1:55:00 PM By: Baruch Gouty RN, BSN Entered By: Baruch Gouty on 10/02/2020  09:29:58 -------------------------------------------------------------------------------- Education Screening Details Patient Name: Date of Service: [name], [name] 10/02/2020 9:00 [place] Record Number: [id number] Patient Account Number: [id number] Date of Birth/Sex: Treating RN: 04/28/1936 (85 y.o. [name] Zamiya Dillard: [name] Other Clinician: Referring Ramond Darnell: Treating [name]/Extender: [name] in Treatment: 0 Primary Learner Assessed: Caregiver dgt in law Reason Patient is not Primary Learner: dementia Learning Preferences/Education Level/Primary Language Learning Preference: Explanation Highest Education Level: College or Above Preferred Language: English Cognitive Barrier Language Barrier: No Translator Needed: No Memory Deficit: Yes dementia Emotional Barrier: No Cultural/Religious Beliefs Affecting Medical Contreras: No Physical Barrier Impaired Vision: Yes Glasses Impaired Hearing: No Decreased Hand dexterity: Yes Knowledge/Comprehension Knowledge Level: High Comprehension Level: High Ability to understand written instructions: High Ability to understand verbal instructions: High Motivation Anxiety Level: Calm Cooperation: Cooperative Education Importance: Acknowledges Need Interest in Health Problems: Asks Questions Perception: Coherent Willingness to Engage in Self-Management High Activities: Readiness to Engage in Self-Management High Activities: Electronic Signature(s) Signed: 10/02/2020 1:55:00 PM By: Baruch Gouty RN, BSN Entered By: Baruch Gouty on 10/02/2020 09:31:58 -------------------------------------------------------------------------------- Fall Risk Assessment Details Patient Name: Date of Service: [name] 10/02/2020 9:00 [place] Number: [id number] Patient Account Number: [id number] Date of Birth/Sex: Treating RN: 14-Mar-1936 (85 y.o. [name] [name]: [name] Other Clinician: Referring [name]: Treating [name]/Extender: [name] in Treatment: 0 Fall Risk Assessment Items Have you had 2 or more falls in the last 12 monthso 0 No Have you had any fall that resulted in injury in the last 12 monthso 0 No FALLS RISK SCREEN History of falling - immediate or within 3 months 0 No Secondary diagnosis (Do you have 2 or more medical diagnoseso) 0 No Ambulatory aid None/bed rest/wheelchair/nurse 0 Yes Crutches/cane/walker 0 No Furniture 0 No Intravenous therapy Access/Saline/Heparin Lock 0 No Gait/Transferring  Normal/ bed rest/ wheelchair 0 Yes Weak (short steps with or without shuffle, stooped but able to lift head while walking, may seek 0 No support from furniture) Impaired (short steps with shuffle, may have difficulty arising from chair, head down, impaired 0 No balance) Mental Status Oriented to own ability 0 No Electronic Signature(s) Signed: 10/02/2020 1:55:00 PM By: Baruch Gouty RN, BSN Entered By: Baruch Gouty on 10/02/2020 09:32:12 -------------------------------------------------------------------------------- Foot Assessment Details Patient Name: Date of Service: [name] 10/02/2020 9:00 [place] Record Number: [id number] Patient Account Number: [id number] Date of Birth/Sex: Treating RN: Jul 09, 1935 (85 y.o. [name] [name]: [name] Other Clinician: Referring [name]: Treating [name]/Extender: [name] in Treatment: 0 Foot Assessment Items Site Locations + = Sensation present, - = Sensation absent, C = Callus, U = Ulcer R = Redness, W = Warmth, M = Maceration, PU = Pre-ulcerative lesion F = Fissure, S = Swelling, D = Dryness Assessment Right: Left: Other Deformity: No No Prior Foot Ulcer: No No Prior Amputation: No No Charcot Joint: No No Ambulatory Status:  Non-ambulatory Assistance Device: Wheelchair Gait: Electronic Signature(s) Signed: 10/02/2020 1:55:00 PM By: Baruch Gouty RN, BSN Entered By: Baruch Gouty on 10/02/2020 09:33:06 -------------------------------------------------------------------------------- Nutrition Risk Screening Details Patient Name: Date of Service: [name], [name] 10/02/2020 9:00 A M Medical Record Number: [id number] Patient Account Number: [id number] Date of Birth/Sex: Treating RN: May 02, 1936 (85 y.o. [name] Tacha Manni: [name] Other Clinician: Referring Hallie Ishida: Treating Eddy Termine/Extender: [name] in Treatment: 0 Height (in): 68 Weight (lbs): 110 Body Mass Index (BMI): 16.7 Nutrition Risk Screening Items Score Screening NUTRITION RISK SCREEN: I have an illness or condition that made me change the kind and/or amount of food I eat 2 Yes I eat fewer than two meals per day 0 No I eat few fruits and vegetables, or milk products 0 No I have three or more drinks of beer, liquor or wine almost every day 0 No I have tooth or mouth problems that make it hard for me to eat 0 No I don't always have enough money to buy the food I need 0 No I eat alone most of the time 0 No I take three or more different prescribed or over-the-counter drugs a day 1 Yes Without wanting to, I have lost or gained 10 pounds in the last six months 2 Yes I am not always physically able to shop, cook and/or feed myself 0 No Nutrition Protocols Good Risk Protocol Moderate Risk Protocol 0 Provide education on nutrition High Risk Proctocol Risk Level: Moderate Risk Score: 5 Electronic Signature(s) Signed: 10/02/2020 1:55:00 PM By: Baruch Gouty RN, BSN Entered By: Baruch Gouty on 10/02/2020 09:32:49

## 2020-10-03 LAB — URINE CULTURE: Culture: >=100000 — AB

## 2020-10-04 ENCOUNTER — Telehealth: Payer: Self-pay | Admitting: Emergency Medicine

## 2020-10-04 NOTE — Telephone Encounter (Signed)
Post ED Visit - Positive Culture Follow-up: Unsuccessful Patient Follow-up  Culture assessed and recommendations reviewed by:  []  Elenor Quinones, Pharm.D. []  Heide Guile, Pharm.D., BCPS AQ-ID []  Parks Neptune, Pharm.D., BCPS []  Alycia Rossetti, Pharm.D., BCPS []  Taylor, Pharm.D., BCPS, AAHIVP []  Legrand Como, Pharm.D., BCPS, AAHIVP [x]  Reuel Boom, PharmD []  Vincenza Hews, PharmD, BCPS  Positive urine culture  [x]  Patient discharged without antimicrobial prescription and treatment is now indicated []  Organism is resistant to prescribed ED discharge antimicrobial []  Patient with positive blood cultures   Unable to contact patient/daughter at phone number on file, letter will be sent to address on file.  Plan: Stop Keflex Start Amoxicillin 500 mg BID for five days (#10) - Sophia Caccavale PA  Milus Mallick 10/04/2020, 5:48 PM

## 2020-10-04 NOTE — Progress Notes (Signed)
ED Antimicrobial Stewardship Positive Culture Follow Up   [name] is an 85 y.o. female who presented to [facility] on 09/30/2020 with a chief complaint of  Chief Complaint  Patient presents with  . Altered Mental Status    Recent Results (from the past 720 hour(s))  Urine culture     Status: Abnormal   Collection Time: 09/30/20  5:37 PM   Specimen: Urine, Catheterized  Result Value Ref Range Status   Specimen Description   Final    URINE, CATHETERIZED Performed at [facility] [address], [place], [place] [address]    Special Requests   Final    NONE Performed at [facility], [place] [address], [place], [place] [address]    Culture >=100,000 COLONIES/mL ENTEROCOCCUS FAECALIS (A)  Final   Report Status 10/03/2020 FINAL  Final   Organism ID, Bacteria ENTEROCOCCUS FAECALIS (A)  Final      Susceptibility   Enterococcus faecalis - MIC*    AMPICILLIN <=2 SENSITIVE Sensitive     NITROFURANTOIN <=16 SENSITIVE Sensitive     VANCOMYCIN 2 SENSITIVE Sensitive     * >=100,000 COLONIES/mL ENTEROCOCCUS FAECALIS  Resp Panel by RT-PCR (Flu A&B, Covid) Urine, Catheterized     Status: None   Collection Time: 09/30/20  6:19 PM   Specimen: Urine, Catheterized; Nasopharyngeal(NP) swabs in vial transport medium  Result Value Ref Range Status   SARS Coronavirus 2 by RT PCR NEGATIVE NEGATIVE Final    Comment: (NOTE) SARS-CoV-2 target nucleic acids are NOT DETECTED.  The SARS-CoV-2 RNA is generally detectable in upper respiratory specimens during the acute phase of infection. The lowest concentration of SARS-CoV-2 viral copies this assay can detect is 138 copies/mL. A negative result does not preclude SARS-Cov-2 infection and should not be used as the sole basis for treatment or other patient management decisions. A negative result may occur with  improper specimen collection/handling, submission of specimen other than nasopharyngeal swab, presence of  viral mutation(s) within the areas targeted by this assay, and inadequate number of viral copies(<138 copies/mL). A negative result must be combined with clinical observations, patient history, and epidemiological information. The expected result is Negative.  Fact Sheet for Patients:  EntrepreneurPulse.com.au  Fact Sheet for Healthcare Providers:  IncredibleEmployment.be  This test is no t yet approved or cleared by the [place] FDA and  has been authorized for detection and/or diagnosis of SARS-CoV-2 by FDA under an Emergency Use Authorization (EUA). This EUA will remain  in effect (meaning this test can be used) for the duration of the COVID-19 declaration under Section 564(b)(1) of the Act, 21 U.S.C.section 360bbb-3(b)(1), unless the authorization is terminated  or revoked sooner.       Influenza A by PCR NEGATIVE NEGATIVE Final   Influenza B by PCR NEGATIVE NEGATIVE Final    Comment: (NOTE) The Xpert Xpress SARS-CoV-2/FLU/RSV plus assay is intended as an aid in the diagnosis of influenza from Nasopharyngeal swab specimens and should not be used as a sole basis for treatment. Nasal washings and aspirates are unacceptable for Xpert Xpress SARS-CoV-2/FLU/RSV testing.  Fact Sheet for Patients: EntrepreneurPulse.com.au  Fact Sheet for Healthcare Providers: IncredibleEmployment.be  This test is not yet approved or cleared by the [place] FDA and has been authorized for detection and/or diagnosis of SARS-CoV-2 by FDA under an Emergency Use Authorization (EUA). This EUA will remain in effect (meaning this test can be used) for the duration of the COVID-19 declaration under Section 564(b)(1) of the  Act, 21 U.S.C. section 360bbb-3(b)(1), unless the authorization is terminated or revoked.  Performed at [facility], [place] [address] [place], [place] [address]     [x]  Treated  with Keflex for UTI, organism resistant to prescribed antimicrobial  New antibiotic prescription: Amoxicillin 500 mg PO bid x 5d (#10)  Follow-up with wound care center if wound worsens or appears infected  ED Provider: Franchot Heidelberg, PA-C   Kalise Fickett, [place] A 10/04/2020, 12:00 PM Clinical Pharmacist [phone]

## 2020-10-06 ENCOUNTER — Telehealth: Payer: Self-pay | Admitting: *Deleted

## 2020-10-06 NOTE — Telephone Encounter (Signed)
Lauren with [facility] called and requested OV notes to be faxed to them to file insurance. Fax [phone]

## 2020-10-07 ENCOUNTER — Telehealth: Payer: Self-pay | Admitting: *Deleted

## 2020-10-07 DIAGNOSIS — L03312 Cellulitis of back [any part except buttock]: Secondary | ICD-10-CM | POA: Diagnosis not present

## 2020-10-07 DIAGNOSIS — I890 Lymphedema, not elsewhere classified: Secondary | ICD-10-CM | POA: Diagnosis not present

## 2020-10-07 DIAGNOSIS — L89150 Pressure ulcer of sacral region, unstageable: Secondary | ICD-10-CM | POA: Diagnosis not present

## 2020-10-07 DIAGNOSIS — F0390 Unspecified dementia without behavioral disturbance: Secondary | ICD-10-CM | POA: Diagnosis not present

## 2020-10-07 DIAGNOSIS — E785 Hyperlipidemia, unspecified: Secondary | ICD-10-CM | POA: Diagnosis not present

## 2020-10-07 DIAGNOSIS — L89320 Pressure ulcer of left buttock, unstageable: Secondary | ICD-10-CM | POA: Diagnosis not present

## 2020-10-07 NOTE — Telephone Encounter (Signed)
[name] with [facility] called requesting verbal orders for OT evaluation for Hand Contractures.  Verbal order given.

## 2020-10-07 NOTE — Progress Notes (Signed)
DILPREET FAIRES ([id number]) , Visit Report for 10/02/2020 Chief Complaint Document Details Patient Name: Date of Service: [name], [name] 10/02/2020 9:00 [name] Record Number: [id number] Patient Account Number: [id number] Date of Birth/Sex: Treating RN: 19-May-1935 (85 y.o. [name] Primary Care Provider: [name] Other Clinician: Referring Provider: Treating Provider/Extender: Rolanda Lundborg in Treatment: 0 Information Obtained from: Patient Chief Complaint Right buttocks pressure ulcer Electronic Signature(s) Signed: 10/07/2020 5:17:53 PM By: Kalman Shan DO Entered By: Kalman Shan on 10/07/2020 17:13:07 -------------------------------------------------------------------------------- Debridement Details Patient Name: Date of Service: [name] 10/02/2020 9:00 [place] Record Number: [id number] Patient Account Number: [id number] Date of Birth/Sex: Treating RN: [birth year] (85 y.o. [name] Primary Care Provider: [name] Other Clinician: Referring Provider: Treating Provider/Extender: Rolanda Lundborg in Treatment: 0 Debridement Performed for Assessment: Wound #2 Left Gluteus Performed By: Physician Kalman Shan, DO Debridement Type: Debridement Level of Consciousness (Pre-procedure): Awake and Alert Pre-procedure Verification/Time Out Yes - 10:10 Taken: Start Time: 10:11 T Area Debrided (L x W): otal 5 (cm) x 6 (cm) = 30 (cm) Tissue and other material debrided: Non-Viable, Eschar Level: Non-Viable Tissue Debridement Description: Selective/Open Wound Instrument: Blade Bleeding: None End Time: 10:16 Response to Treatment: Procedure was tolerated well Level of Consciousness (Post- Awake and Alert procedure): Post Debridement Measurements of Total Wound Length: (cm) 6.8 Stage: Unstageable/Unclassified Width: (cm) 7.8 Depth: (cm) 0.1 Volume: (cm) 4.166 Character of  Wound/Ulcer Post Debridement: Requires Further Debridement Post Procedure Diagnosis Same as Pre-procedure Electronic Signature(s) Signed: 10/02/2020 1:31:18 PM By: Lorrin Jackson Signed: 10/07/2020 5:17:53 PM By: Kalman Shan DO Entered By: Lorrin Jackson on 10/02/2020 10:19:29 -------------------------------------------------------------------------------- HPI Details Patient Name: Date of Service: [name] 10/02/2020 9:00 [place] Record Number: [id number] Patient Account Number: [id number] Date of Birth/Sex: Treating RN: 17-Sep-1935 (85 y.o. [name] Primary Care Provider: [name] Other Clinician: Referring Provider: Treating Provider/Extender: Rolanda Lundborg in Treatment: 0 History of Present Illness HPI Description: Admission 5/27 [name] is an 85 year old female with a past medical history of type 2 diabetes on oral agents, hypertension and dementia that presents today to the clinic for a wound to her right buttocks. Her daughter-in-law is present today and helps provide the history. This issue has been going on for 2 to 3 weeks now. She states she developed an eschar about 1 week ago. She has been taking doxycycline for possible soft tissue infection to the area prescribed by her primary care provider. Patient currently denies any signs of infection. Electronic Signature(s) Signed: 10/07/2020 5:17:53 PM By: Kalman Shan DO Entered By: Kalman Shan on 10/07/2020 17:14:45 -------------------------------------------------------------------------------- Physical Exam Details Patient Name: Date of Service: [name], [name] 10/02/2020 9:00 A M Medical Record Number: [id number] Patient Account Number: [id number] Date of Birth/Sex: Treating RN: 01/27/1936 (85 y.o. [name] Primary Care Provider: [name] Other Clinician: Referring Provider: Treating Provider/Extender: Jennell Corner Weeks in Treatment: 0 Constitutional respirations regular, non-labored and within target range for patient.Marland Kitchen Psychiatric pleasant and cooperative. Notes Right buttocks: Eschar. No obvious signs of infection. Electronic Signature(s) Signed: 10/07/2020 5:17:53 PM By: Kalman Shan DO Entered By: Kalman Shan on 10/07/2020 17:15:07 -------------------------------------------------------------------------------- Physician Orders Details Patient Name: Date of Service: [name], [name] 10/02/2020 9:00 A M Medical Record Number: [id number] Patient Account Number: [id number] Date of Birth/Sex: Treating RN: Oct 02, 1935 (85 y.o. [name] Primary Care Provider: [name] Other Clinician: Referring Provider: Treating Provider/Extender: Kalman Shan  [name] Weeks in Treatment: 0 Verbal / Phone Orders: No Diagnosis Coding ICD-10 Coding Code Description L89.310 Pressure ulcer of right buttock, unstageable E11.9 Type 2 diabetes mellitus without complications Follow-up Appointments ppointment in 1 week. - with Dr. Heber Opdyke Return A Other: - Continue/Finish previously prescribed antibiotics Off-Loading Low air-loss mattress (Group 2) - Old one not working, going to go through [facility] to order new one Turn and reposition every 2 hours Additional Orders / Instructions Follow Nutritious Diet - 100-120g of Protein Home Health dmit to [facility] for wound care. May utilize formulary equivalent dressing for wound treatment orders unless otherwise specified. - A [facility] New wound care orders this week; continue Home Health for wound care. May utilize formulary equivalent dressing for wound treatment orders unless otherwise specified. - Santyl to wound Wound Treatment Wound #2 - Gluteus Wound Laterality: Left Cleanser: Soap and Water ([place]) 1 x Per Day/30 Days Discharge Instructions: May shower and wash wound with dial antibacterial soap and water  prior to dressing change. Prim Dressing: Santyl Ointment (Home Health) 1 x Per Day/30 Days ary Discharge Instructions: Apply nickel thick amount to wound bed as instructed Secondary Dressing: Woven Gauze Sponge, Non-Sterile 4x4 in (Home Health) 1 x Per Day/30 Days Discharge Instructions: Apply over primary dressing as directed. Secondary Dressing: Zetuvit Plus Silicone Border Dressing 7x7(in/in) (Home Health) 1 x Per Day/30 Days Discharge Instructions: Apply silicone border over primary dressing as directed. Patient Medications llergies: No Known Allergies A Notifications Medication Indication Start End 10/02/2020 Santyl DOSE 1 - topical 250 unit/gram ointment - 1 application daily Electronic Signature(s) Signed: 10/07/2020 5:17:53 PM By: Kalman Shan DO Previous Signature: 10/02/2020 10:34:19 AM Version By: Kalman Shan DO Entered By: Kalman Shan on 10/07/2020 17:15:50 -------------------------------------------------------------------------------- Problem List Details Patient Name: Date of Service: [name] 10/02/2020 9:00 A M Medical Record Number: [id number] Patient Account Number: [id number] Date of Birth/Sex: Treating RN: [birth year] (85 y.o. [name] Primary Care Provider: Other Clinician: [name] Referring Provider: Treating Provider/Extender: Rolanda Lundborg in Treatment: 0 Active Problems ICD-10 Encounter Code Description Active Date MDM Diagnosis L89.310 Pressure ulcer of right buttock, unstageable 10/02/2020 No Yes E11.9 Type 2 diabetes mellitus without complications 11/06/6965 No Yes Inactive Problems Resolved Problems Electronic Signature(s) Signed: 10/07/2020 5:17:53 PM By: Kalman Shan DO Entered By: Kalman Shan on 10/07/2020 17:12:49 -------------------------------------------------------------------------------- Progress Note Details Patient Name: Date of Service: [name] 10/02/2020 9:00  A M Medical Record Number: [id number] Patient Account Number: [id number] Date of Birth/Sex: Treating RN: Dec 15, 1935 (85 y.o. [name] Primary Care Provider: [name] Other Clinician: Referring Provider: Treating Provider/Extender: Rolanda Lundborg in Treatment: 0 Subjective Chief Complaint Information obtained from Patient Right buttocks pressure ulcer History of Present Illness (HPI) Admission 5/27 [name] is an 85 year old female with a past medical history of type 2 diabetes on oral agents, hypertension and dementia that presents today to the clinic for a wound to her right buttocks. Her daughter-in-law is present today and helps provide the history. This issue has been going on for 2 to 3 weeks now. She states she developed an eschar about 1 week ago. She has been taking doxycycline for possible soft tissue infection to the area prescribed by her primary care provider. Patient currently denies any signs of infection. Patient History Unable to Obtain Patient History due to Dementia. Allergies No Known Allergies Family History Stroke - Siblings, No family history of Cancer, Diabetes, Heart Disease, Hereditary Spherocytosis,  Hypertension, Kidney Disease, Lung Disease, Seizures, Thyroid Problems, Tuberculosis. Social History Never smoker, Marital Status - Divorced, Alcohol Use - Never, Drug Use - No History, Caffeine Use - Never. Medical History Eyes Denies history of Cataracts, Glaucoma, Optic Neuritis Ear/Nose/Mouth/Throat Denies history of Chronic sinus problems/congestion, Middle ear problems Hematologic/Lymphatic Patient has history of Anemia Denies history of Hemophilia, Human Immunodeficiency Virus, Lymphedema, Sickle Cell Disease Respiratory Denies history of Aspiration, Asthma, Chronic Obstructive Pulmonary Disease (COPD), Pneumothorax, Sleep Apnea, Tuberculosis Cardiovascular Patient has history of Deep Vein Thrombosis,  Hypertension Denies history of Angina, Arrhythmia, Congestive Heart Failure, Coronary Artery Disease, Hypotension, Myocardial Infarction, Peripheral Arterial Disease, Peripheral Venous Disease, Phlebitis, Vasculitis Gastrointestinal Denies history of Cirrhosis , Colitis, Crohnoos, Hepatitis A, Hepatitis B, Hepatitis C Endocrine Patient has history of Type II Diabetes Denies history of Type I Diabetes Genitourinary Denies history of End Stage Renal Disease Immunological Denies history of Lupus Erythematosus, Raynaudoos, Scleroderma Integumentary (Skin) Denies history of History of Burn Musculoskeletal Denies history of Gout, Rheumatoid Arthritis, Osteoarthritis, Osteomyelitis Neurologic Patient has history of Dementia Denies history of Neuropathy, Quadriplegia, Paraplegia, Seizure Disorder Oncologic Denies history of Received Chemotherapy, Received Radiation Psychiatric Denies history of Anorexia/bulimia, Confinement Anxiety Patient is treated with Oral Agents. Blood sugar is tested. Hospitalization/Surgery History - cholecystectomy. - vaginal hysterectomy. - vascular surgery. Medical A Surgical History Notes nd Respiratory hx PE Cardiovascular atrial septal aneurysm, hyperlipidemia Genitourinary recent UTi Neurologic CVA Review of Systems (ROS) Constitutional Symptoms (General Health) Complains or has symptoms of Fatigue, Fever. Denies complaints or symptoms of Chills, Marked Weight Change. Eyes Denies complaints or symptoms of Dry Eyes, Vision Changes, Glasses / Contacts. Ear/Nose/Mouth/Throat Denies complaints or symptoms of Chronic sinus problems or rhinitis. Respiratory Denies complaints or symptoms of Chronic or frequent coughs, Shortness of Breath. Gastrointestinal Denies complaints or symptoms of Frequent diarrhea, Nausea, Vomiting. Endocrine Denies complaints or symptoms of Heat/cold intolerance. Integumentary (Skin) Complains or has symptoms of Wounds -  sacrum. Musculoskeletal Complains or has symptoms of Muscle Weakness. Neurologic Denies complaints or symptoms of Numbness/parasthesias. Psychiatric Denies complaints or symptoms of Claustrophobia, Suicidal. Objective Constitutional respirations regular, non-labored and within target range for patient.. Vitals Time Taken: 9:15 AM, Height: 68 in, Source: Stated, Weight: 110 lbs, BMI: 16.7, Temperature: 97.4 F, Pulse: 106 bpm, Respiratory Rate: 18 breaths/min, Blood Pressure: 162/80 mmHg, Capillary Blood Glucose: 138 mg/dl. General Notes: glucose per family report this am Psychiatric pleasant and cooperative. General Notes: Right buttocks: Eschar. No obvious signs of infection. Integumentary (Hair, Skin) Wound #2 status is Open. Original cause of wound was Pressure Injury. The date acquired was: 09/18/2020. The wound is located on the Left Gluteus. The wound measures 6.8cm length x 7.8cm width x 0.1cm depth; 41.658cm^2 area and 4.166cm^3 volume. There is Fat Layer (Subcutaneous Tissue) exposed. There is no tunneling or undermining noted. There is a medium amount of serosanguineous drainage noted. Foul odor after cleansing was noted. The wound margin is flat and intact. There is no granulation within the wound bed. There is a large (67-100%) amount of necrotic tissue within the wound bed including Eschar. Assessment Active Problems ICD-10 Pressure ulcer of right buttock, unstageable Type 2 diabetes mellitus without complications Patient has an unstageable wound to her right buttocks that is caused by pressure. The eschar was crosshatched and I recommended Santyl daily to this wound. We also discussed aggressive offloading to the area. We we will try and order an air mattress for her. There are no signs of infection today. I will see her at follow-up  in 1 week Procedures Wound #2 Pre-procedure diagnosis of Wound #2 is a Pressure Ulcer located on the Left Gluteus . There was a  Selective/Open Wound Non-Viable Tissue Debridement with a total area of 30 sq cm performed by Kalman Shan, DO. With the following instrument(s): Blade to remove Non-Viable tissue/material. Material removed includes Eschar. No specimens were taken. A time out was conducted at 10:10, prior to the start of the procedure. There was no bleeding. The procedure was tolerated well. Post Debridement Measurements: 6.8cm length x 7.8cm width x 0.1cm depth; 4.166cm^3 volume. Post debridement Stage noted as Unstageable/Unclassified. Character of Wound/Ulcer Post Debridement requires further debridement. Post procedure Diagnosis Wound #2: Same as Pre-Procedure Plan Follow-up Appointments: Return Appointment in 1 week. - with Dr. Heber [place] Other: - Continue/Finish previously prescribed antibiotics Off-Loading: Low air-loss mattress (Group 2) - Old one not working, going to go through [facility] to order new one Turn and reposition every 2 hours Additional Orders / Instructions: Follow Nutritious Diet - 100-120g of Protein Home Health: Admit to [facility] for wound care. May utilize formulary equivalent dressing for wound treatment orders unless otherwise specified. [facility] New wound care orders this week; continue Home Health for wound care. May utilize formulary equivalent dressing for wound treatment orders unless otherwise specified. - Santyl to wound The following medication(s) was prescribed: Santyl topical 250 unit/gram ointment 1 1 application daily starting 10/02/2020 WOUND #2: - Gluteus Wound Laterality: Left Cleanser: Soap and Water (Home Health) 1 x Per Day/30 Days Discharge Instructions: May shower and wash wound with dial antibacterial soap and water prior to dressing change. Prim Dressing: Santyl Ointment (Home Health) 1 x Per Day/30 Days ary Discharge Instructions: Apply nickel thick amount to wound bed as instructed Secondary Dressing: Woven Gauze Sponge, Non-Sterile 4x4 in (Home Health)  1 x Per Day/30 Days Discharge Instructions: Apply over primary dressing as directed. Secondary Dressing: Zetuvit Plus Silicone Border Dressing 7x7(in/in) (Home Health) 1 x Per Day/30 Days Discharge Instructions: Apply silicone border over primary dressing as directed. 1. Crosshatching to the eschar 2. Santyl daily 3. Follow-up in 1 week Electronic Signature(s) Signed: 10/07/2020 5:17:53 PM By: Kalman Shan DO Entered By: Kalman Shan on 10/07/2020 17:16:51 -------------------------------------------------------------------------------- HxROS Details Patient Name: Date of Service: [name] 10/02/2020 9:00 A M Medical Record Number: [id number] Patient Account Number: [id number] Date of Birth/Sex: Treating RN: [birth year] (85 y.o. [name] Primary Care Provider: [name] Other Clinician: Referring Provider: Treating Provider/Extender: Rolanda Lundborg in Treatment: 0 Unable to Obtain Patient History due to Dementia Constitutional Symptoms (General Health) Complaints and Symptoms: Positive for: Fatigue; Fever Negative for: Chills; Marked Weight Change Eyes Complaints and Symptoms: Negative for: Dry Eyes; Vision Changes; Glasses / Contacts Medical History: Negative for: Cataracts; Glaucoma; Optic Neuritis Ear/Nose/Mouth/Throat Complaints and Symptoms: Negative for: Chronic sinus problems or rhinitis Medical History: Negative for: Chronic sinus problems/congestion; Middle ear problems Respiratory Complaints and Symptoms: Negative for: Chronic or frequent coughs; Shortness of Breath Medical History: Negative for: Aspiration; Asthma; Chronic Obstructive Pulmonary Disease (COPD); Pneumothorax; Sleep Apnea; Tuberculosis Past Medical History Notes: hx PE Gastrointestinal Complaints and Symptoms: Negative for: Frequent diarrhea; Nausea; Vomiting Medical History: Negative for: Cirrhosis ; Colitis; Crohns; Hepatitis A; Hepatitis  B; Hepatitis C Endocrine Complaints and Symptoms: Negative for: Heat/cold intolerance Medical History: Positive for: Type II Diabetes Negative for: Type I Diabetes Time with diabetes: 15 years Treated with: Oral agents Blood sugar tested every day: Yes Tested : 2 times per day Integumentary (Skin)  Complaints and Symptoms: Positive for: Wounds - sacrum Medical History: Negative for: History of Burn Musculoskeletal Complaints and Symptoms: Positive for: Muscle Weakness Medical History: Negative for: Gout; Rheumatoid Arthritis; Osteoarthritis; Osteomyelitis Neurologic Complaints and Symptoms: Negative for: Numbness/parasthesias Medical History: Positive for: Dementia Negative for: Neuropathy; Quadriplegia; Paraplegia; Seizure Disorder Past Medical History Notes: CVA Psychiatric Complaints and Symptoms: Negative for: Claustrophobia; Suicidal Medical History: Negative for: Anorexia/bulimia; Confinement Anxiety Hematologic/Lymphatic Medical History: Positive for: Anemia Negative for: Hemophilia; Human Immunodeficiency Virus; Lymphedema; Sickle Cell Disease Cardiovascular Medical History: Positive for: Deep Vein Thrombosis; Hypertension Negative for: Angina; Arrhythmia; Congestive Heart Failure; Coronary Artery Disease; Hypotension; Myocardial Infarction; Peripheral Arterial Disease; Peripheral Venous Disease; Phlebitis; Vasculitis Past Medical History Notes: atrial septal aneurysm, hyperlipidemia Genitourinary Medical History: Negative for: End Stage Renal Disease Past Medical History Notes: recent UTi Immunological Medical History: Negative for: Lupus Erythematosus; Raynauds; Scleroderma Oncologic Medical History: Negative for: Received Chemotherapy; Received Radiation Immunizations Pneumococcal Vaccine: Received Pneumococcal Vaccination: No Implantable Devices None Hospitalization / Surgery History Type of Hospitalization/Surgery cholecystectomy vaginal  hysterectomy vascular surgery Family and Social History Cancer: No; Diabetes: No; Heart Disease: No; Hereditary Spherocytosis: No; Hypertension: No; Kidney Disease: No; Lung Disease: No; Seizures: No; Stroke: Yes - Siblings; Thyroid Problems: No; Tuberculosis: No; Never smoker; Marital Status - Divorced; Alcohol Use: Never; Drug Use: No History; Caffeine Use: Never; Financial Concerns: No; Food, Clothing or Shelter Needs: No; Support System Lacking: No; Transportation Concerns: No Electronic Signature(s) Signed: 10/02/2020 1:55:00 PM By: Baruch Gouty RN, BSN Signed: 10/07/2020 5:17:53 PM By: Kalman Shan DO Entered By: Baruch Gouty on 10/02/2020 09:23:25 -------------------------------------------------------------------------------- SuperBill Details Patient Name: Date of Service: [name], [name] 10/02/2020 Medical Record Number: [id number] Patient Account Number: [id number] Date of Birth/Sex: Treating RN: 06/13/1935 (85 y.o. [name] Primary Care Provider: [name] Other Clinician: Referring Provider: Treating Provider/Extender: Rolanda Lundborg in Treatment: 0 Diagnosis Coding ICD-10 Codes Code Description L89.310 Pressure ulcer of right buttock, unstageable E11.9 Type 2 diabetes mellitus without complications Facility Procedures CPT4 Code: [id number] Description: [place] VISIT-LEV 3 EST PT Modifier: 25 Quantity: 1 CPT4 Code: [id number] Description: 51884 - DEBRIDE WOUND 1ST 20 SQ CM OR < ICD-10 Diagnosis Description L89.310 Pressure ulcer of right buttock, unstageable Modifier: Quantity: 1 CPT4 Code: [id number] Description: 60109 - DEBRIDE WOUND EA ADDL 20 SQ CM ICD-10 Diagnosis Description L89.310 Pressure ulcer of right buttock, unstageable Modifier: Quantity: 1 Physician Procedures : CPT4 Code Description Modifier [id number] - WC PHYS LEVEL 3 - EST PT ICD-10 Diagnosis Description L89.310 Pressure ulcer of right  buttock, unstageable E11.9 Type 2 diabetes mellitus without complications Quantity: 1 : [id number] - WC PHYS DEBR WO ANESTH 20 SQ CM ICD-10 Diagnosis Description L89.310 Pressure ulcer of right buttock, unstageable Quantity: 1 : [id number] 07371 - WC PHYS DEBR WO ANESTH EA ADD 20 CM ICD-10 Diagnosis Description L89.310 Pressure ulcer of right buttock, unstageable Quantity: 1 Electronic Signature(s) Signed: 10/07/2020 5:17:53 PM By: Kalman Shan DO Previous Signature: 10/02/2020 1:31:18 PM Version By: Lorrin Jackson Entered By: Kalman Shan on 10/07/2020 17:17:30

## 2020-10-09 ENCOUNTER — Encounter (HOSPITAL_BASED_OUTPATIENT_CLINIC_OR_DEPARTMENT_OTHER): Payer: Medicare Other | Attending: Internal Medicine | Admitting: Internal Medicine

## 2020-10-09 ENCOUNTER — Telehealth: Payer: Self-pay | Admitting: *Deleted

## 2020-10-09 ENCOUNTER — Other Ambulatory Visit: Payer: Self-pay

## 2020-10-09 ENCOUNTER — Other Ambulatory Visit (HOSPITAL_COMMUNITY)
Admission: RE | Admit: 2020-10-09 | Discharge: 2020-10-09 | Disposition: A | Discharge status: Home / Self Care | Payer: Medicare Other | Source: Other Acute Inpatient Hospital | Attending: Internal Medicine | Admitting: Internal Medicine

## 2020-10-09 DIAGNOSIS — L89310 Pressure ulcer of right buttock, unstageable: Secondary | ICD-10-CM | POA: Insufficient documentation

## 2020-10-09 DIAGNOSIS — E1151 Type 2 diabetes mellitus with diabetic peripheral angiopathy without gangrene: Secondary | ICD-10-CM | POA: Diagnosis not present

## 2020-10-09 DIAGNOSIS — L89320 Pressure ulcer of left buttock, unstageable: Secondary | ICD-10-CM | POA: Insufficient documentation

## 2020-10-09 DIAGNOSIS — E11622 Type 2 diabetes mellitus with other skin ulcer: Secondary | ICD-10-CM | POA: Diagnosis not present

## 2020-10-09 DIAGNOSIS — L89153 Pressure ulcer of sacral region, stage 3: Secondary | ICD-10-CM

## 2020-10-09 NOTE — Telephone Encounter (Signed)
Received order request from [facility] for Air Mattress due to Sacral Wound.  Printed order and placed in [place] folder with paperwork to sign. To be faxed back to [facility] Fax: [phone]

## 2020-10-10 NOTE — Progress Notes (Signed)
PATTIJO JUSTE ([id number]) , Visit Report for 10/09/2020 Debridement Details Patient Name: Date of Service: ANIYLA, HARLING 10/09/2020 10:45 A M Medical Record Number: [id number] Patient Account Number: [id number] Date of Birth/Sex: Treating RN: 09-Sep-1935 (85 y.o. [name] Primary Care Provider: [name] Other Clinician: Referring Provider: Treating Provider/Extender: Deloria Lair in Treatment: 1 Debridement Performed for Assessment: Wound #2 Left Gluteus Performed By: Physician Ricard Dillon., MD Debridement Type: Debridement Level of Consciousness (Pre-procedure): Awake and Alert Pre-procedure Verification/Time Out Yes - 11:49 Taken: Start Time: 11:50 Pain Control: Other : Benzocaine T Area Debrided (L x W): otal 8.8 (cm) x 4.6 (cm) = 40.48 (cm) Tissue and other material debrided: Non-Viable, Eschar, Fat, Slough, Subcutaneous, Slough Level: Skin/Subcutaneous Tissue Debridement Description: Excisional Instrument: Blade, Forceps, Scissors Specimen: Swab, Number of Specimens T aken: 1 Bleeding: Moderate Hemostasis Achieved: Gel Foam End Time: 11:58 Response to Treatment: Procedure was tolerated well Level of Consciousness (Post- Awake and Alert procedure): Post Debridement Measurements of Total Wound Length: (cm) 8.8 Stage: Unstageable/Unclassified Width: (cm) 4.6 Depth: (cm) 1.9 Volume: (cm) 60.407 Character of Wound/Ulcer Post Debridement: Stable Post Procedure Diagnosis Same as Pre-procedure Electronic Signature(s) Signed: 10/09/2020 6:03:14 PM By: Lorrin Jackson Signed: 10/10/2020 7:13:41 AM By: Linton Ham MD Entered By: Linton Ham on 10/09/2020 12:24:53 -------------------------------------------------------------------------------- HPI Details Patient Name: Date of Service: [name] 10/09/2020 10:45 A M Medical Record Number: [id number] Patient Account Number: [id number] Date of Birth/Sex: Treating  RN: [birth year] (85 y.o. [name] Primary Care Provider: [name] Other Clinician: Referring Provider: Treating Provider/Extender: Deloria Lair in Treatment: 1 History of Present Illness HPI Description: Admission 5/27 Ms. Chizaram Latino is an 85 year old female with a past medical history of type 2 diabetes on oral agents, hypertension and dementia that presents today to the clinic for a wound to her right buttocks. Her daughter-in-law is present today and helps provide the history. This issue has been going on for 2 to 3 weeks now. She states she developed an eschar about 1 week ago. She has been taking doxycycline for possible soft tissue infection to the area prescribed by her primary care provider. Patient currently denies any signs of infection. 6/3; patient admitted to the clinic last week. She has a large necrotic area on her left buttock. They use Santyl last week. On arrival this visit she had completely necrotic surface with open to subcutaneous tissue. The surface was completely nonviable. They have been using Santyl. She apparently had been on doxycycline which she is just finishing prescribed by her primary doctor for possible soft tissue infection Electronic Signature(s) Signed: 10/10/2020 7:13:41 AM By: Linton Ham MD Entered By: Linton Ham on 10/09/2020 12:28:07 -------------------------------------------------------------------------------- Physical Exam Details Patient Name: Date of Service: TYJA, GORTNEY 10/09/2020 10:45 A M Medical Record Number: [id number] Patient Account Number: [id number] Date of Birth/Sex: Treating RN: 1935/10/03 (86 y.o. [name] Primary Care Provider: [name] Other Clinician: Referring Provider: Treating Provider/Extender: Deloria Lair in Treatment: 1 Constitutional Patient is hypertensive.. Pulse regular and within target range for patient.Marland Kitchen Respirations  regular, non-labored and within target range.. Temperature is normal and within the target range for the patient.Marland Kitchen Appears in no distress. Notes Wound exam; left buttock. Completely necrotic surface. Malodorous. Using pickups and a 10 scalpel I removed 75% of the necrotic covering. We had 1 small arterial bleeder that required Gelfoam. After removal of the surface a culture was obtained. There did not appear to be much  soft tissue infection Electronic Signature(s) Signed: 10/10/2020 7:13:41 AM By: Linton Ham MD Entered By: Linton Ham on 10/09/2020 12:30:20 -------------------------------------------------------------------------------- Physician Orders Details Patient Name: Date of Service: SEQUOYA, HOGSETT 10/09/2020 10:45 A M Medical Record Number: [id number] Patient Account Number: [id number] Date of Birth/Sex: Treating RN: [birth year] (85 y.o. [name] Primary Care Provider: [name] Other Clinician: Referring Provider: Treating Provider/Extender: Deloria Lair in Treatment: 1 Verbal / Phone Orders: No Diagnosis Coding ICD-10 Coding Code Description L89.310 Pressure ulcer of right buttock, unstageable E11.9 Type 2 diabetes mellitus without complications Follow-up Appointments ppointment in 1 week. - Tuesday with Dr. Dellia Nims Return A Other: - Continue/Finish previously prescribed antibiotics Off-Loading Low air-loss mattress (Group 2) - Old one not working, new one ordered Turn and reposition every 2 hours Additional Orders / Instructions Follow Nutritious Diet - 100-120g of Protein Home Health dmit to [facility] for wound care. May utilize formulary equivalent dressing for wound treatment orders unless otherwise specified. - A [facility] New wound care orders this week; continue Home Health for wound care. May utilize formulary equivalent dressing for wound treatment orders unless otherwise specified. - Silver Alginate as primary  dressing Wound Treatment Wound #2 - Gluteus Wound Laterality: Left Cleanser: Soap and Water ([place]) 1 x Per Day/30 Days Discharge Instructions: May shower and wash wound with dial antibacterial soap and water prior to dressing change. Prim Dressing: KerraCel Ag Gelling Fiber Dressing, 4x5 in (silver alginate) (Home Health) 1 x Per Day/30 Days ary Discharge Instructions: pack into wound bed Secondary Dressing: Woven Gauze Sponge, Non-Sterile 4x4 in (Home Health) 1 x Per Day/30 Days Discharge Instructions: Lightly pack in wound over silver alginate Secondary Dressing: ABD Pad, 8x10 (Home Health) 1 x Per Day/30 Days Discharge Instructions: Apply over primary dressing as directed. Secured With: 51M Medipore H Soft Cloth Surgical T 4 x 2 (in/yd) (Home Health) 1 x Per Day/30 Days ape Discharge Instructions: Secure dressing with tape as directed. Electronic Signature(s) Signed: 10/09/2020 6:03:14 PM By: Lorrin Jackson Signed: 10/10/2020 7:13:41 AM By: Linton Ham MD Previous Signature: 10/09/2020 11:54:58 AM Version By: Lorrin Jackson Entered By: Lorrin Jackson on 10/09/2020 12:12:58 -------------------------------------------------------------------------------- Problem List Details Patient Name: Date of Service: MORGANA, ROWLEY 10/09/2020 10:45 A M Medical Record Number: [id number] Patient Account Number: [id number] Date of Birth/Sex: Treating RN: 05-17-1935 (85 y.o. [name] Primary Care Provider: [name] Other Clinician: Referring Provider: Treating Provider/Extender: Deloria Lair in Treatment: 1 Active Problems ICD-10 Encounter Code Description Active Date MDM Diagnosis L89.310 Pressure ulcer of right buttock, unstageable 10/02/2020 No Yes E11.9 Type 2 diabetes mellitus without complications 06/13/5807 No Yes Inactive Problems Resolved Problems Electronic Signature(s) Signed: 10/10/2020 7:13:41 AM By: Linton Ham MD Previous  Signature: 10/09/2020 11:54:36 AM Version By: Lorrin Jackson Entered By: Linton Ham on 10/09/2020 12:24:04 -------------------------------------------------------------------------------- Progress Note Details Patient Name: Date of Service: [name] 10/09/2020 10:45 A M Medical Record Number: [id number] Patient Account Number: [id number] Date of Birth/Sex: Treating RN: 12/23/1935 (85 y.o. [name] Primary Care Provider: [name] Other Clinician: Referring Provider: Treating Provider/Extender: Deloria Lair in Treatment: 1 Subjective History of Present Illness (HPI) Admission 5/27 Ms. Myles Mallicoat is an 85 year old female with a past medical history of type 2 diabetes on oral agents, hypertension and dementia that presents today to the clinic for a wound to her right buttocks. Her daughter-in-law is present today and helps provide the history. This issue has been going  on for 2 to 3 weeks now. She states she developed an eschar about 1 week ago. She has been taking doxycycline for possible soft tissue infection to the area prescribed by her primary care provider. Patient currently denies any signs of infection. 6/3; patient admitted to the clinic last week. She has a large necrotic area on her left buttock. They use Santyl last week. On arrival this visit she had completely necrotic surface with open to subcutaneous tissue. The surface was completely nonviable. They have been using Santyl. She apparently had been on doxycycline which she is just finishing prescribed by her primary doctor for possible soft tissue infection Objective Constitutional Patient is hypertensive.. Pulse regular and within target range for patient.Marland Kitchen Respirations regular, non-labored and within target range.. Temperature is normal and within the target range for the patient.Marland Kitchen Appears in no distress. Vitals Time Taken: 11:43 AM, Height: 68 in, Weight: 110 lbs, BMI: 16.7,  Temperature: 98.3 F, Pulse: 94 bpm, Respiratory Rate: 17 breaths/min, Blood Pressure: 184/101 mmHg, Capillary Blood Glucose: 136 mg/dl. General Notes: Wound exam; left buttock. Completely necrotic surface. Malodorous. Using pickups and a 10 scalpel I removed 75% of the necrotic covering. We had 1 small arterial bleeder that required Gelfoam. After removal of the surface a culture was obtained. There did not appear to be much soft tissue infection Integumentary (Hair, Skin) Wound #2 status is Open. Original cause of wound was Pressure Injury. The date acquired was: 09/18/2020. The wound has been in treatment 1 weeks. The wound is located on the Left Gluteus. The wound measures 8.8cm length x 4.6cm width x 1.9cm depth; 31.793cm^2 area and 60.407cm^3 volume. There is Fat Layer (Subcutaneous Tissue) exposed. There is no tunneling noted, however, there is undermining starting at 12:00 and ending at 12:00 with a maximum distance of 2cm. There is a medium amount of serosanguineous drainage noted. Foul odor after cleansing was noted. The wound margin is flat and intact. There is no granulation within the wound bed. There is a large (67-100%) amount of necrotic tissue within the wound bed including Eschar. Assessment Active Problems ICD-10 Pressure ulcer of right buttock, unstageable Type 2 diabetes mellitus without complications Procedures Wound #2 Pre-procedure diagnosis of Wound #2 is a Pressure Ulcer located on the Left Gluteus . There was a Excisional Skin/Subcutaneous Tissue Debridement with a total area of 40.48 sq cm performed by Ricard Dillon., MD. With the following instrument(s): Blade, Forceps, and Scissors to remove Non-Viable tissue/material. Material removed includes Fat, Eschar, Subcutaneous Tissue, and Slough after achieving pain control using Other (Benzocaine). 1 specimen was taken by a Swab and sent to the lab per facility protocol. A time out was conducted at 11:49, prior to the  start of the procedure. A Moderate amount of bleeding was controlled with Gel Foam. The procedure was tolerated well. Post Debridement Measurements: 8.8cm length x 4.6cm width x 1.9cm depth; 60.407cm^3 volume. Post debridement Stage noted as Unstageable/Unclassified. Character of Wound/Ulcer Post Debridement is stable. Post procedure Diagnosis Wound #2: Same as Pre-Procedure Plan Follow-up Appointments: Return Appointment in 1 week. - Tuesday with Dr. Dellia Nims Other: - Continue/Finish previously prescribed antibiotics Off-Loading: Low air-loss mattress (Group 2) - Old one not working, new one ordered Turn and reposition every 2 hours Additional Orders / Instructions: Follow Nutritious Diet - 100-120g of Protein Home Health: Admit to [facility] for wound care. May utilize formulary equivalent dressing for wound treatment orders unless otherwise specified. [facility] New wound care orders this week; continue Home  Health for wound care. May utilize formulary equivalent dressing for wound treatment orders unless otherwise specified. - Silver Alginate as primary dressing WOUND #2: - Gluteus Wound Laterality: Left Cleanser: Soap and Water ([place]) 1 x Per Day/30 Days Discharge Instructions: May shower and wash wound with dial antibacterial soap and water prior to dressing change. Prim Dressing: KerraCel Ag Gelling Fiber Dressing, 4x5 in (silver alginate) (Home Health) 1 x Per Day/30 Days ary Discharge Instructions: pack into wound bed Secondary Dressing: Woven Gauze Sponge, Non-Sterile 4x4 in (Home Health) 1 x Per Day/30 Days Discharge Instructions: Lightly pack in wound over silver alginate Secondary Dressing: ABD Pad, 8x10 (Home Health) 1 x Per Day/30 Days Discharge Instructions: Apply over primary dressing as directed. Secured With: 13M Medipore H Soft Cloth Surgical T 4 x 2 (in/yd) (Home Health) 1 x Per Day/30 Days ape Discharge Instructions: Secure dressing with tape as  directed. 1. Silver alginate as the primary dressing change daily 2. I did a culture of this area although she is on doxycycline. Often in this type of wound it turns out to be gram-negative I did not change her or give her additional antibiotics at this point 3. Although the wound is deep but does not go to bone and at this point I did not order any imaging studies. 4. There is still about 25% of this wound area that will require aggressive debridement. Most of the area debrided was necrotic fat and subcutaneous tissue. Electronic Signature(s) Signed: 10/10/2020 7:13:41 AM By: Linton Ham MD Entered By: Linton Ham on 10/09/2020 12:31:29 -------------------------------------------------------------------------------- SuperBill Details Patient Name: Date of Service: [name] 10/09/2020 Medical Record Number: [id number] Patient Account Number: [id number] Date of Birth/Sex: Treating RN: December 04, 1935 (85 y.o. [name] Primary Care Provider: [name] Other Clinician: Referring Provider: Treating Provider/Extender: Deloria Lair in Treatment: 1 Diagnosis Coding ICD-10 Codes Code Description L89.310 Pressure ulcer of right buttock, unstageable E11.9 Type 2 diabetes mellitus without complications Facility Procedures Physician Procedures : CPT4 Code Description Modifier [id number] - WC PHYS DEBR MUSCLE/FASCIA 20 SQ CM ICD-10 Diagnosis Description L89.310 Pressure ulcer of right buttock, unstageable Quantity: 1 Electronic Signature(s) Signed: 10/10/2020 7:13:41 AM By: Linton Ham MD Entered By: Linton Ham on 10/09/2020 12:32:11

## 2020-10-12 ENCOUNTER — Other Ambulatory Visit: Payer: Self-pay

## 2020-10-12 ENCOUNTER — Other Ambulatory Visit: Payer: Medicare Other | Admitting: *Deleted

## 2020-10-12 DIAGNOSIS — Z515 Encounter for palliative care: Secondary | ICD-10-CM

## 2020-10-12 NOTE — Progress Notes (Signed)
[name] ([id number]) , Visit Report for 10/09/2020 Arrival Information Details Patient Name: Date of Service: [name], [place] 10/09/2020 10:45 A M Medical Record Number: [id number] Patient Account Number: [id number] Date of Birth/Sex: Treating RN: 1935/07/05 (85 y.o. [name], Lauren Primary Care Teja Costen: [name] Other Clinician: Referring Delissa Silba: Treating Wes Lezotte/Extender: Deloria Lair in Treatment: 1 Visit Information History Since Last Visit Added or deleted any medications: No Patient Arrived: Wheel Chair Any new allergies or adverse reactions: No Arrival Time: 11:43 Had a fall or experienced change in No Accompanied By: cg activities of daily living that may affect Transfer Assistance: None risk of falls: Patient Identification Verified: Yes Signs or symptoms of abuse/neglect since last visito No Secondary Verification Process Completed: Yes Hospitalized since last visit: No Patient Requires Transmission-Based Precautions: No Implantable device outside of the clinic excluding No Patient Has Alerts: No cellular tissue based products placed in the center since last visit: Has Dressing in Place as Prescribed: Yes Pain Present Now: No Electronic Signature(s) Signed: 10/12/2020 5:37:32 PM By: Rhae Hammock RN Entered By: Rhae Hammock on 10/09/2020 11:43:29 -------------------------------------------------------------------------------- Encounter Discharge Information Details Patient Name: Date of Service: Carolyn Fillers 10/09/2020 10:45 A M Medical Record Number: [id number] Patient Account Number: [id number] Date of Birth/Sex: Treating RN: [birth year] (85 y.o. [name] Primary Care Marasia Newhall: [name] Other Clinician: Referring Ahtziri Jeffries: Treating Noah Lembke/Extender: Deloria Lair in Treatment: 1 Encounter Discharge Information Items Post Procedure Vitals Discharge Condition:  Stable Temperature (F): 98.3 Ambulatory Status: Wheelchair Pulse (bpm): 94 Discharge Destination: Home Respiratory Rate (breaths/min): 18 Transportation: Private Auto Blood Pressure (mmHg): 184/101 Accompanied By: caregiver Schedule Follow-up Appointment: Yes Clinical Summary of Care: Patient Declined Electronic Signature(s) Signed: 10/09/2020 6:25:30 PM By: Baruch Gouty RN, BSN Entered By: Baruch Gouty on 10/09/2020 13:29:33 -------------------------------------------------------------------------------- Lower Extremity Assessment Details Patient Name: Date of Service: [name] 10/09/2020 10:45 A M Medical Record Number: [id number] Patient Account Number: [id number] Date of Birth/Sex: Treating RN: 1935/09/15 (85 y.o. [name], Lauren Primary Care Bari Handshoe: [name] Other Clinician: Referring Tabius Rood: Treating Melenie Minniear/Extender: Deloria Lair in Treatment: 1 Electronic Signature(s) Signed: 10/12/2020 5:37:32 PM By: Rhae Hammock RN Entered By: Rhae Hammock on 10/09/2020 11:44:20 -------------------------------------------------------------------------------- Multi Wound Chart Details Patient Name: Date of Service: [name] 10/09/2020 10:45 A M Medical Record Number: [id number] Patient Account Number: [id number] Date of Birth/Sex: Treating RN: 1935-11-01 (85 y.o. [name] Primary Care Cele [name]: [name] Other Clinician: Referring Cloie Wooden: Treating Sherriann Szuch/Extender: Deloria Lair in Treatment: 1 Vital Signs Height(in): 51 Capillary Blood Glucose(mg/dl): 136 Weight(lbs): 110 Pulse(bpm): 26 Body Mass Index(BMI): 17 Blood Pressure(mmHg): 184/101 Temperature(F): 98.3 Respiratory Rate(breaths/min): 17 Photos: [2:No Photos Left Gluteus] [N/A:N/A N/A] Wound Location: [2:Pressure Injury] [N/A:N/A] Wounding Event: [2:Pressure Ulcer] [N/A:N/A] Primary Etiology: [2:Anemia,  Deep Vein Thrombosis,] [N/A:N/A] Comorbid History: [2:Hypertension, Type II Diabetes, Dementia 09/18/2020] [N/A:N/A] Date Acquired: [2:1] [N/A:N/A] Weeks of Treatment: [2:Open] [N/A:N/A] Wound Status: [2:8.8x4.6x1.9] [N/A:N/A] Measurements L x W x D (cm) [2:31.793] [N/A:N/A] A (cm) : rea [2:60.407] [N/A:N/A] Volume (cm) : [2:23.70%] [N/A:N/A] % Reduction in A rea: [2:-1350.00%] [N/A:N/A] % Reduction in Volume: [2:12] Starting Position 1 (o'clock): [2:12] Ending Position 1 (o'clock): [2:2] Maximum Distance 1 (cm): [2:Yes] [N/A:N/A] Undermining: [2:Unstageable/Unclassified] [N/A:N/A] Classification: [2:Medium] [N/A:N/A] Exudate A mount: [2:Serosanguineous] [N/A:N/A] Exudate Type: [2:red, brown] [N/A:N/A] Exudate Color: [2:Yes] [N/A:N/A] Foul Odor A Cleansing: [2:fter No] [N/A:N/A] Odor A nticipated Due to Product Use: [2:Flat and Intact] [N/A:N/A] Wound Margin: [2:None Present (0%)] [N/A:N/A]  Granulation A mount: [2:Large (67-100%)] [N/A:N/A] Necrotic A mount: [2:Eschar] [N/A:N/A] Necrotic Tissue: [2:Fat Layer (Subcutaneous Tissue): Yes N/A] Exposed Structures: [2:Fascia: No Tendon: No Muscle: No Joint: No Bone: No None] [N/A:N/A] Epithelialization: [2:Debridement - Excisional] [N/A:N/A] Debridement: Pre-procedure Verification/Time Out 11:49 [N/A:N/A] Taken: [2:Other] [N/A:N/A] Pain Control: [2:Necrotic/Eschar, Subcutaneous,] [N/A:N/A] Tissue Debrided: [2:Slough Skin/Subcutaneous Tissue] [N/A:N/A] Level: [2:40.48] [N/A:N/A] Debridement A (sq cm): [2:rea Blade, Forceps, Scissors] [N/A:N/A] Instrument: [2:Swab] [N/A:N/A] Specimen: [2:1] [N/A:N/A] Number of Specimens Taken: [2:Moderate] [N/A:N/A] Bleeding: [2:Gel Foam] [N/A:N/A] Hemostasis Achieved: Debridement Treatment Response: Procedure was tolerated well [N/A:N/A] Post Debridement Measurements L x 8.8x4.6x1.9 [N/A:N/A] W x D (cm) [2:60.407] [N/A:N/A] Post Debridement Volume: (cm) [2:Unstageable/Unclassified]  [N/A:N/A] Post Debridement Stage: [2:Debridement] [N/A:N/A] Treatment Notes Electronic Signature(s) Signed: 10/09/2020 6:03:14 PM By: Lorrin Jackson Signed: 10/10/2020 7:13:41 AM By: Linton Ham MD Entered By: Linton Ham on 10/09/2020 12:24:12 -------------------------------------------------------------------------------- Multi-Disciplinary Care Plan Details Patient Name: Date of Service: [place] 10/09/2020 10:45 A M Medical Record Number: [id number] Patient Account Number: [id number] Date of Birth/Sex: Treating RN: 1935/11/05 (85 y.o. [name] Primary Care Otelia Hettinger: [name] Other Clinician: Referring Vika Buske: Treating Mckaylie Vasey/Extender: Deloria Lair in Treatment: 1 Active Inactive Pressure Nursing Diagnoses: Knowledge deficit related to management of pressures ulcers Goals: Patient/caregiver will verbalize understanding of pressure ulcer management Date Initiated: 10/02/2020 Target Resolution Date: 10/30/2020 Goal Status: Active Interventions: Assess: immobility, friction, shearing, incontinence upon admission and as needed Assess offloading mechanisms upon admission and as needed Assess potential for pressure ulcer upon admission and as needed Provide education on pressure ulcers Notes: Wound/Skin Impairment Nursing Diagnoses: Impaired tissue integrity Goals: Patient/caregiver will verbalize understanding of skin care regimen Date Initiated: 10/02/2020 Target Resolution Date: 10/30/2020 Goal Status: Active Ulcer/skin breakdown will have a volume reduction of 30% by week 4 Date Initiated: 10/02/2020 Target Resolution Date: 10/30/2020 Goal Status: Active Interventions: Assess patient/caregiver ability to obtain necessary supplies Assess patient/caregiver ability to perform ulcer/skin care regimen upon admission and as needed Assess ulceration(s) every visit Provide education on ulcer and skin care Treatment  Activities: Skin care regimen initiated : 10/02/2020 Topical wound management initiated : 10/02/2020 Notes: Electronic Signature(s) Signed: 10/09/2020 6:03:14 PM By: Lorrin Jackson Entered By: Lorrin Jackson on 10/09/2020 11:56:43 -------------------------------------------------------------------------------- Pain Assessment Details Patient Name: Date of Service: TARICA, HARL 10/09/2020 10:45 A M Medical Record Number: [id number] Patient Account Number: [id number] Date of Birth/Sex: Treating RN: Apr 01, 1936 (85 y.o. [name], Lauren Primary Care Habeeb Puertas: [name] Other Clinician: Referring Trae Bovenzi: Treating Jerren Flinchbaugh/Extender: Deloria Lair in Treatment: 1 Active Problems Location of Pain Severity and Description of Pain Patient Has Paino No Site Locations Pain Management and Medication Current Pain Management: Electronic Signature(s) Signed: 10/12/2020 5:37:32 PM By: Rhae Hammock RN Entered By: Rhae Hammock on 10/09/2020 11:44:14 -------------------------------------------------------------------------------- Patient/Caregiver Education Details Patient Name: Date of Service: Carolyn Fillers 6/3/2022andnbsp10:45 A M Medical Record Number: [id number] Patient Account Number: [id number] Date of Birth/Gender: Treating RN: February 25, 1936 (85 y.o. [name] Primary Care Physician: [name] Other Clinician: Referring Physician: Treating Physician/Extender: Deloria Lair in Treatment: 1 Education Assessment Education Provided To: Patient and Caregiver Education Topics Provided Pressure: Methods: Explain/Verbal, Printed Responses: State content correctly Wound Debridement: Methods: Explain/Verbal Responses: State content correctly Wound/Skin Impairment: Methods: Explain/Verbal, Printed Responses: State content correctly Electronic Signature(s) Signed: 10/09/2020 6:03:14 PM By: Lorrin Jackson Entered By: Lorrin Jackson on 10/09/2020 12:07:31 -------------------------------------------------------------------------------- Wound Assessment Details Patient Name: Date of Service: SIBLEY, [place] 10/09/2020 10:45 A M Medical Record Number:  [id number] Patient Account Number: [id number] Date of Birth/Sex: Treating RN: 1935/06/15 (85 y.o. [name], Lauren Primary Care Wylene Weissman: [name] Other Clinician: Referring Orestes Geiman: Treating Adonte Vanriper/Extender: Deloria Lair in Treatment: 1 Wound Status Wound Number: 2 Primary Pressure Ulcer Etiology: Wound Location: Left Gluteus Wound Status: Open Wounding Event: Pressure Injury Comorbid Anemia, Deep Vein Thrombosis, Hypertension, Type II Date Acquired: 09/18/2020 History: Diabetes, Dementia Weeks Of Treatment: 1 Clustered Wound: No Photos Wound Measurements Length: (cm) 8.8 Width: (cm) 4.6 Depth: (cm) 1.9 Area: (cm) 31.793 Volume: (cm) 60.407 % Reduction in Area: 23.7% % Reduction in Volume: -1350% Epithelialization: None Tunneling: No Undermining: Yes Starting Position (o'clock): 12 Ending Position (o'clock): 12 Maximum Distance: (cm) 2 Wound Description Classification: Unstageable/Unclassified Wound Margin: Flat and Intact Exudate Amount: Medium Exudate Type: Serosanguineous Exudate Color: red, brown Foul Odor After Cleansing: Yes Due to Product Use: No Slough/Fibrino Yes Wound Bed Granulation Amount: None Present (0%) Exposed Structure Necrotic Amount: Large (67-100%) Fascia Exposed: No Necrotic Quality: Eschar Fat Layer (Subcutaneous Tissue) Exposed: Yes Tendon Exposed: No Muscle Exposed: No Joint Exposed: No Bone Exposed: No Treatment Notes Wound #2 (Gluteus) Wound Laterality: Left Cleanser Soap and Water Discharge Instruction: May shower and wash wound with dial antibacterial soap and water prior to dressing change. Peri-Wound Care Topical Primary  Dressing KerraCel Ag Gelling Fiber Dressing, 4x5 in (silver alginate) Discharge Instruction: pack into wound bed Secondary Dressing Woven Gauze Sponge, Non-Sterile 4x4 in Discharge Instruction: Lightly pack in wound over silver alginate Allevyn Adhesive Foam Sacrum Dressing, 6.75x6.75 (in/in) Secured With 77M Medipore H Soft Cloth Surgical T 4 x 2 (in/yd) ape Discharge Instruction: Secure dressing with tape as directed. Compression Wrap Compression Stockings Add-Ons Electronic Signature(s) Signed: 10/09/2020 5:48:41 PM By: Sandre Kitty Signed: 10/12/2020 5:37:32 PM By: Rhae Hammock RN Entered By: Sandre Kitty on 10/09/2020 17:13:37 -------------------------------------------------------------------------------- Vitals Details Patient Name: Date of Service: Carolyn Fillers 10/09/2020 10:45 A M Medical Record Number: [id number] Patient Account Number: [id number] Date of Birth/Sex: Treating RN: 11/10/1935 (85 y.o. [name], Lauren Primary Care Devon Kingdon: [name] Other Clinician: Referring Kari Montero: Treating Lynde Ludwig/Extender: Deloria Lair in Treatment: 1 Vital Signs Time Taken: 11:43 Temperature (F): 98.3 Height (in): 68 Pulse (bpm): 94 Weight (lbs): 110 Respiratory Rate (breaths/min): 17 Body Mass Index (BMI): 16.7 Blood Pressure (mmHg): 184/101 Capillary Blood Glucose (mg/dl): 136 Reference Range: 80 - 120 mg / dl Electronic Signature(s) Signed: 10/12/2020 5:37:32 PM By: Rhae Hammock RN Entered By: Rhae Hammock on 10/09/2020 11:44:09

## 2020-10-13 ENCOUNTER — Other Ambulatory Visit: Payer: Self-pay

## 2020-10-13 ENCOUNTER — Encounter (HOSPITAL_BASED_OUTPATIENT_CLINIC_OR_DEPARTMENT_OTHER): Payer: Medicare Other | Admitting: Internal Medicine

## 2020-10-13 DIAGNOSIS — L89320 Pressure ulcer of left buttock, unstageable: Secondary | ICD-10-CM | POA: Diagnosis not present

## 2020-10-13 DIAGNOSIS — E1151 Type 2 diabetes mellitus with diabetic peripheral angiopathy without gangrene: Secondary | ICD-10-CM | POA: Diagnosis not present

## 2020-10-13 DIAGNOSIS — E11622 Type 2 diabetes mellitus with other skin ulcer: Secondary | ICD-10-CM | POA: Diagnosis not present

## 2020-10-13 NOTE — Progress Notes (Signed)
[name] ([id number]) , Visit Report for 10/13/2020 Arrival Information Details Patient Name: Date of Service: [name], [name] Medical Record Number: [id number] Patient Account Number: [id number] Date of Birth/Sex: Treating RN: 03-23-1936 (85 y.o. [name] Primary Care [name]: [name] Other Clinician: Referring [name]: Treating [name]/Extender: [name] in Treatment: 1 Visit Information History Since Last Visit Added or deleted any medications: No Patient Arrived: [name] Any new allergies or adverse reactions: No Arrival Time: 11:16 Had Carolyn fall or experienced change in No Accompanied By: daughter activities of daily living that may affect Transfer Assistance: None risk of falls: Patient Identification Verified: Yes Signs or symptoms of abuse/neglect since last visito No Secondary Verification Process Completed: Yes Hospitalized since last visit: No Patient Requires Transmission-Based Precautions: No Implantable device outside of the clinic excluding No Patient Has Alerts: No cellular tissue based products placed in the center since last visit: Has Dressing in Place as Prescribed: Yes Pain Present Now: Yes Electronic Signature(s) Signed: 10/13/2020 2:13:29 PM By: Sandre Kitty Entered By: Sandre Kitty on 10/13/2020 11:17:01 -------------------------------------------------------------------------------- Encounter Discharge Information Details Patient Name: Date of Service: [name] 10/13/2020 10:45 [name] Medical Record Number: [id number] Patient Account Number: [id number] Date of Birth/Sex: Treating RN: 08-22-1935 (85 y.o. [name]: [name] Other Clinician: Referring [name]: Treating [name]/Extender: [name] in Treatment: 1 Encounter Discharge Information Items Discharge Condition: Stable Ambulatory Status:  Wheelchair Discharge Destination: Home Transportation: Private Auto Accompanied By: daughter in law Schedule Follow-up Appointment: Yes Clinical Summary of Care: Provided on 10/13/2020 Form Type Recipient Paper Patient Patient Electronic Signature(s) Signed: 10/13/2020 11:46:59 AM By: Lorrin Jackson Entered By: Lorrin Jackson on 10/13/2020 11:46:59 -------------------------------------------------------------------------------- Lower Extremity Assessment Details Patient Name: Date of Service: [name], [name] 10/13/2020 10:45 [name] Medical Record Number: [id number] Patient Account Number: [id number] Date of Birth/Sex: Treating RN: 15-Apr-1936 (85 y.o. [name] Primary Care [name]: [name] Other Clinician: Referring [name]: Treating [name]: [name] in Treatment: 1 Electronic Signature(s) Signed: 10/13/2020 5:30:54 PM By: Lorrin Jackson Entered By: Lorrin Jackson on 10/13/2020 11:30:53 -------------------------------------------------------------------------------- Multi Wound Chart Details Patient Name: Date of Service: [name] 10/13/2020 10:45 [name] Medical Record Number: [id number] Patient Account Number: [id number] Date of Birth/Sex: Treating RN: 13-May-1935 (85 y.o. [name] Primary Care [name]: [name] Other Clinician: Referring Johnell Landowski: Treating [name]/Extender: [name] in Treatment: 1 Vital Signs Height(in): 25 Capillary Blood Glucose(mg/dl): 170 Weight(lbs): 110 Pulse(bpm): 116 Body Mass Index(BMI): 17 Blood Pressure(mmHg): 128/79 Temperature(F): 98.3 Respiratory Rate(breaths/min): 17 Photos: [2:No Photos Left Gluteus] [N/Carolyn:N/Carolyn N/Carolyn] Wound Location: [2:Pressure Injury] [N/Carolyn:N/Carolyn] Wounding Event: [2:Pressure Ulcer] [N/Carolyn:N/Carolyn] Primary Etiology: [2:Anemia, Deep Vein Thrombosis,] [N/Carolyn:N/Carolyn] Comorbid History: [2:Hypertension, Type II Diabetes, Dementia  09/18/2020] [N/Carolyn:N/Carolyn] Date Acquired: [2:1] [N/Carolyn:N/Carolyn] Weeks of Treatment: [2:Open] [N/Carolyn:N/Carolyn] Wound Status: [2:7.5x6x2.1] [N/Carolyn:N/Carolyn] Measurements L x W x D (cm) [2:35.343] [N/Carolyn:N/Carolyn] Carolyn (cm) : rea [2:74.22] [N/Carolyn:N/Carolyn] Volume (cm) : [2:15.20%] [N/Carolyn:N/Carolyn] % Reduction in Carolyn rea: [2:-1681.60%] [N/Carolyn:N/Carolyn] % Reduction in Volume: [2:2] Starting Position 1 (o'clock): [2:4] Ending Position 1 (o'clock): [2:2] Maximum Distance 1 (cm): [2:Yes] [N/Carolyn:N/Carolyn] Undermining: [2:Unstageable/Unclassified] [N/Carolyn:N/Carolyn] Classification: [2:Medium] [N/Carolyn:N/Carolyn] Exudate Carolyn mount: [2:Serosanguineous] [N/Carolyn:N/Carolyn] Exudate Type: [2:red, brown] [N/Carolyn:N/Carolyn] Exudate Color: [2:Well defined, not attached] [N/Carolyn:N/Carolyn] Wound Margin: [2:Medium (34-66%)] [N/Carolyn:N/Carolyn] Granulation Carolyn mount: [2:Medium (34-66%)] [N/Carolyn:N/Carolyn] Necrotic Carolyn mount: [2:Eschar, Adherent Slough] [N/Carolyn:N/Carolyn] Necrotic Tissue: [2:Fat Layer (Subcutaneous Tissue): Yes N/Carolyn] Exposed Structures: [2:Fascia: No Tendon: No Muscle: No  Joint: No Bone: No None] [N/Carolyn:N/Carolyn] Epithelialization: [2:Debridement - Excisional] [N/Carolyn:N/Carolyn] Debridement: [2:11:46] [N/Carolyn:N/Carolyn] Pre-procedure Verification/Time Out Taken: [2:Lidocaine] [N/Carolyn:N/Carolyn] Pain Control: [2:Subcutaneous, Slough] [N/Carolyn:N/Carolyn] Tissue Debrided: [2:Skin/Subcutaneous Tissue] [N/Carolyn:N/Carolyn] Level: [2:45] [N/Carolyn:N/Carolyn] Debridement Carolyn (sq cm): [2:rea Curette] [N/Carolyn:N/Carolyn] Instrument: [2:Minimum] [N/Carolyn:N/Carolyn] Bleeding: [2:Pressure] [N/Carolyn:N/Carolyn] Hemostasis Carolyn chieved: [2:0] [N/Carolyn:N/Carolyn] Procedural Pain: [2:0] [N/Carolyn:N/Carolyn] Post Procedural Pain: [2:Procedure was tolerated well] [N/Carolyn:N/Carolyn] Debridement Treatment Response: [2:7.5x6x2.1] [N/Carolyn:N/Carolyn] Post Debridement Measurements L x W x D (cm) [2:74.22] [N/Carolyn:N/Carolyn] Post Debridement Volume: (cm) [2:Unstageable/Unclassified] [N/Carolyn:N/Carolyn] Post Debridement Stage: [2:Debridement] [N/Carolyn:N/Carolyn] Treatment Notes Wound #2 (Gluteus) Wound Laterality: Left Cleanser Soap and Water Discharge Instruction: May shower and wash wound with dial  antibacterial soap and water prior to dressing change. Peri-Wound Care Topical Primary Dressing KerraCel Ag Gelling Fiber Dressing, 4x5 in (silver alginate) Discharge Instruction: pack into wound bed Secondary Dressing Woven Gauze Sponge, Non-Sterile 4x4 in Discharge Instruction: Lightly pack in wound over silver alginate ABD Pad, 8x10 Discharge Instruction: Apply over primary dressing as directed. Secured With 58M Medipore H Soft Cloth Surgical T 4 x 2 (in/yd) ape Discharge Instruction: Secure dressing with tape as directed. Compression Wrap Compression Stockings Add-Ons Electronic Signature(s) Signed: 10/13/2020 5:27:47 PM By: Rhae Hammock RN Signed: 10/13/2020 5:45:41 PM By: Linton Ham MD Entered By: Linton Ham on 10/13/2020 12:28:55 -------------------------------------------------------------------------------- Multi-Disciplinary Care Plan Details Patient Name: Date of Service: [name] 10/13/2020 10:45 [name] Medical Record Number: [id number] Patient Account Number: [id number] Date of Birth/Sex: Treating RN: 10-17-1935 (85 y.o. [name] Primary Care [place] Stell: [name] Other Clinician: Referring Rumi Taras: Treating Bryley Kovacevic/Extender: [name] in Treatment: 1 Active Inactive Pressure Nursing Diagnoses: Knowledge deficit related to management of pressures ulcers Goals: Patient/caregiver will verbalize understanding of pressure ulcer management Date Initiated: 10/02/2020 Target Resolution Date: 10/30/2020 Goal Status: Active Interventions: Assess: immobility, friction, shearing, incontinence upon admission and as needed Assess offloading mechanisms upon admission and as needed Assess potential for pressure ulcer upon admission and as needed Provide education on pressure ulcers Notes: Wound/Skin Impairment Nursing Diagnoses: Impaired tissue integrity Goals: Patient/caregiver will verbalize understanding  of skin care regimen Date Initiated: 10/02/2020 Target Resolution Date: 10/30/2020 Goal Status: Active Ulcer/skin breakdown will have Carolyn volume reduction of 30% by week 4 Date Initiated: 10/02/2020 Target Resolution Date: 10/30/2020 Goal Status: Active Interventions: Assess patient/caregiver ability to obtain necessary supplies Assess patient/caregiver ability to perform ulcer/skin care regimen upon admission and as needed Assess ulceration(s) every visit Provide education on ulcer and skin care Treatment Activities: Skin care regimen initiated : 10/02/2020 Topical wound management initiated : 10/02/2020 Notes: Electronic Signature(s) Signed: 10/13/2020 5:27:47 PM By: Rhae Hammock RN Entered By: Rhae Hammock on 10/13/2020 11:45:46 -------------------------------------------------------------------------------- Pain Assessment Details Patient Name: Date of Service: [name], [name] 10/13/2020 10:45 [name] Medical Record Number: [id number] Patient Account Number: [id number] Date of Birth/Sex: Treating RN: 03-May-1936 (85 y.o. [name] Primary Care Anthonyjames Bargar: [name] Other Clinician: Referring Amayra Kiedrowski: Treating Analese Sovine/Extender: [name] in Treatment: 1 Active Problems Location of Pain Severity and Description of Pain Patient Has Paino Yes Site Locations Rate the pain. Rate the pain. Current Pain Level: 6 Pain Management and Medication Current Pain Management: Electronic Signature(s) Signed: 10/13/2020 2:13:29 PM By: Sandre Kitty Signed: 10/13/2020 5:27:47 PM By: Rhae Hammock RN Entered By: Sandre Kitty on 10/13/2020 11:17:43 -------------------------------------------------------------------------------- Patient/Caregiver Education Details Patient Name: Date of Service: [name] 6/7/2022andnbsp10:45 [name] Medical Record Number: [id number] Patient Account Number: [id number] Date of Birth/Gender: Treating  RN: Apr 18, 1936 (85 y.o. F) Hollie Salk,  [name] Primary Care Physician: [name] Other Clinician: Referring Physician: Treating Physician/Extender: [name] in Treatment: 1 Education Assessment Education Provided To: Patient Education Topics Provided Pressure: Methods: Explain/Verbal Responses: State content correctly Wound/Skin Impairment: Methods: Explain/Verbal Responses: State content correctly Electronic Signature(s) Signed: 10/13/2020 5:27:47 PM By: Rhae Hammock RN Entered By: Rhae Hammock on 10/13/2020 11:46:03 -------------------------------------------------------------------------------- Wound Assessment Details Patient Name: Date of Service: [name], [name] 10/13/2020 10:45 [name] Medical Record Number: [id number] Patient Account Number: [id number] Date of Birth/Sex: Treating RN: 1935-12-16 (85 y.o. [name] Primary Care Raymon Schlarb: [name] Other Clinician: Referring Amee Boothe: Treating Edrees Valent/Extender: [name] in Treatment: 1 Wound Status Wound Number: 2 Primary Pressure Ulcer Etiology: Wound Location: Left Gluteus Wound Status: Open Wounding Event: Pressure Injury Comorbid Anemia, Deep Vein Thrombosis, Hypertension, Type II Date Acquired: 09/18/2020 History: Diabetes, Dementia Weeks Of Treatment: 1 Clustered Wound: No Photos Wound Measurements Length: (cm) 7.5 Width: (cm) 6 Depth: (cm) 2.1 Area: (cm) 35.343 Volume: (cm) 74.22 % Reduction in Area: 15.2% % Reduction in Volume: -1681.6% Epithelialization: None Tunneling: No Undermining: Yes Starting Position (o'clock): 2 Ending Position (o'clock): 4 Maximum Distance: (cm) 2 Wound Description Classification: Unstageable/Unclassified Wound Margin: Well defined, not attached Exudate Amount: Medium Exudate Type: Serosanguineous Exudate Color: red, brown Foul Odor After Cleansing: No Slough/Fibrino Yes Wound  Bed Granulation Amount: Medium (34-66%) Exposed Structure Necrotic Amount: Medium (34-66%) Fascia Exposed: No Necrotic Quality: Eschar, Adherent Slough Fat Layer (Subcutaneous Tissue) Exposed: Yes Tendon Exposed: No Muscle Exposed: No Joint Exposed: No Bone Exposed: No Treatment Notes Wound #2 (Gluteus) Wound Laterality: Left Cleanser Soap and Water Discharge Instruction: May shower and wash wound with dial antibacterial soap and water prior to dressing change. Peri-Wound Care Topical Primary Dressing KerraCel Ag Gelling Fiber Dressing, 4x5 in (silver alginate) Discharge Instruction: pack into wound bed Secondary Dressing Woven Gauze Sponge, Non-Sterile 4x4 in Discharge Instruction: Lightly pack in wound over silver alginate ABD Pad, 8x10 Discharge Instruction: Apply over primary dressing as directed. Secured With 47M Medipore H Soft Cloth Surgical T 4 x 2 (in/yd) ape Discharge Instruction: Secure dressing with tape as directed. Compression Wrap Compression Stockings Add-Ons Electronic Signature(s) Signed: 10/13/2020 5:03:12 PM By: Sandre Kitty Signed: 10/13/2020 5:27:47 PM By: Rhae Hammock RN Entered By: Sandre Kitty on 10/13/2020 16:26:04 -------------------------------------------------------------------------------- Vitals Details Patient Name: Date of Service: [name], [name] 10/13/2020 10:45 [name] Medical Record Number: [id number] Patient Account Number: [id number] Date of Birth/Sex: Treating RN: 19-Jan-1936 (85 y.o. [name] Primary Care [place]: [name] Other Clinician: Referring Lindalou Soltis: Treating Yomara Toothman/Extender: [name] in Treatment: 1 Vital Signs Time Taken: 11:17 Temperature (F): 98.3 Height (in): 68 Pulse (bpm): 116 Weight (lbs): 110 Respiratory Rate (breaths/min): 17 Body Mass Index (BMI): 16.7 Blood Pressure (mmHg): 128/79 Capillary Blood Glucose (mg/dl): 170 Reference Range: 80 - 120  mg / dl Electronic Signature(s) Signed: 10/13/2020 2:13:29 PM By: Sandre Kitty Entered By: Sandre Kitty on 10/13/2020 11:17:21

## 2020-10-13 NOTE — Progress Notes (Signed)
[name] ([id number]) , Visit Report for 10/13/2020 Debridement Details Patient Name: Date of Service: SONNIE, BIAS 10/13/2020 10:45 A M Medical Record Number: [id number] Patient Account Number: [id number] Date of Birth/Sex: Treating RN: Sep 02, 1935 (85 y.o. [name], Lauren Primary Care Provider: [name] Other Clinician: Referring Provider: Treating Provider/Extender: Deloria Lair in Treatment: 1 Debridement Performed for Assessment: Wound #2 Left Gluteus Performed By: Physician Ricard Dillon., MD Debridement Type: Debridement Level of Consciousness (Pre-procedure): Awake and Alert Pre-procedure Verification/Time Out Yes - 11:46 Taken: Start Time: 11:46 Pain Control: Lidocaine T Area Debrided (L x W): otal 7.5 (cm) x 6 (cm) = 45 (cm) Tissue and other material debrided: Viable, Non-Viable, Slough, Subcutaneous, Skin: Dermis , Skin: Epidermis, Slough Level: Skin/Subcutaneous Tissue Debridement Description: Excisional Instrument: Curette Bleeding: Minimum Hemostasis Achieved: Pressure End Time: 11:47 Procedural Pain: 0 Post Procedural Pain: 0 Response to Treatment: Procedure was tolerated well Level of Consciousness (Post- Awake and Alert procedure): Post Debridement Measurements of Total Wound Length: (cm) 7.5 Stage: Unstageable/Unclassified Width: (cm) 6 Depth: (cm) 2.1 Volume: (cm) 74.22 Character of Wound/Ulcer Post Debridement: Improved Post Procedure Diagnosis Same as Pre-procedure Electronic Signature(s) Signed: 10/13/2020 5:27:47 PM By: Rhae Hammock RN Signed: 10/13/2020 5:45:41 PM By: Linton Ham MD Entered By: Linton Ham on 10/13/2020 12:29:10 -------------------------------------------------------------------------------- HPI Details Patient Name: Date of Service: [name] 10/13/2020 10:45 A M Medical Record Number: [id number] Patient Account Number: [id number] Date of Birth/Sex: Treating  RN: July 18, 1935 (85 y.o. [name] Primary Care Provider: [name] Other Clinician: Referring Provider: Treating Provider/Extender: Deloria Lair in Treatment: 1 History of Present Illness HPI Description: Admission 5/27 Ms. Lakitha Gordy is an 85 year old female with a past medical history of type 2 diabetes on oral agents, hypertension and dementia that presents today to the clinic for a wound to her right buttocks. Her daughter-in-law is present today and helps provide the history. This issue has been going on for 2 to 3 weeks now. She states she developed an eschar about 1 week ago. She has been taking doxycycline for possible soft tissue infection to the area prescribed by her primary care provider. Patient currently denies any signs of infection. 6/3; patient admitted to the clinic last week. She has a large necrotic area on her left buttock. They use Santyl last week. On arrival this visit she had completely necrotic surface with open to subcutaneous tissue. The surface was completely nonviable. They have been using Santyl. She apparently had been on doxycycline which she is just finishing prescribed by her primary doctor for possible soft tissue infection 6/7; patient with a large necrotic wound over her left buttock. Aggressive debridement last week. Culture of this grew Proteus unfortunately would not of been covered well or at least predictably by the doxycycline that her primary doctor put her on. I have therefore put her on Augmentin suspension 3 times a day. We are using silver alginate as the primary dressing while we deal with the infection. Her daughter has a list of concerns including swallowing difficulties, concerns about aspiration. The patient has advanced dementia. If this is Alzheimer's disease it is at its preterminal stages. I went over that swallowing difficulties are part of what happens in the preterminal stages of Alzheimer's  disease. Nevertheless we will family members seems to want to pursue a very aggressive course Electronic Signature(s) Signed: 10/13/2020 5:45:41 PM By: Linton Ham MD Entered By: Linton Ham on 10/13/2020 12:32:54 -------------------------------------------------------------------------------- Physical Exam Details Patient Name: Date  of Service: KINSLEE, DALPE 10/13/2020 10:45 A M Medical Record Number: [id number] Patient Account Number: [id number] Date of Birth/Sex: Treating RN: [birth year] (85 y.o. [name] Primary Care Provider: [name] Other Clinician: Referring Provider: Treating Provider/Extender: Deloria Lair in Treatment: 1 Constitutional Sitting or standing Blood Pressure is within target range for patient.. Pulse regular and within target range for patient.Marland Kitchen Respirations regular, non-labored and within target range.. Temperature is normal and within the target range for the patient.Marland Kitchen Appears in no distress. Signs of weight loss. Ears, Nose, Mouth, and Throat She would not let me look in her mouth. Cardiovascular Not grossly dehydrated. Gastrointestinal (GI) Abdomen is soft and non-distended without masses or tenderness.. No liver or spleen enlargement. Notes Wound exam; left buttock. Aggressive debridement last week. I used a #5 curette to complete this by enlarge this week. There is still some necrotic tissues on the edges but overall the surface of the wound looks better. This does not approximate bone but nevertheless is a deep wound. There is not overt surrounding soft tissue infection. Electronic Signature(s) Signed: 10/13/2020 5:45:41 PM By: Linton Ham MD Entered By: Linton Ham on 10/13/2020 12:35:16 -------------------------------------------------------------------------------- Physician Orders Details Patient Name: Date of Service: TOBEY, LIPPARD 10/13/2020 10:45 A M Medical Record Number:  [id number] Patient Account Number: [id number] Date of Birth/Sex: Treating RN: 07/21/1935 (85 y.o. [name] Primary Care Provider: [name] Other Clinician: Referring Provider: Treating Provider/Extender: Deloria Lair in Treatment: 1 Verbal / Phone Orders: No Diagnosis Coding Follow-up Appointments ppointment in 2 weeks. - Tuesday with Dr. Dellia Nims Return A Other: - Pick up the antibiotics that we are calling in today. Off-Loading Low air-loss mattress (Group 2) - Old one not working, new one ordered Turn and reposition every 2 hours Additional Orders / Instructions Follow Nutritious Diet - 100-120g of Protein Home Health No change in wound care orders this week; continue Home Health for wound care. May utilize formulary equivalent dressing for wound treatment orders unless otherwise specified. [facility] Monday's, Wednseday's, and Friday's Wound Treatment Wound #2 - Gluteus Wound Laterality: Left Cleanser: Soap and Water (Home Health) 1 x Per Day/30 Days Discharge Instructions: May shower and wash wound with dial antibacterial soap and water prior to dressing change. Prim Dressing: KerraCel Ag Gelling Fiber Dressing, 4x5 in (silver alginate) (Home Health) 1 x Per Day/30 Days ary Discharge Instructions: pack into wound bed Secondary Dressing: Woven Gauze Sponge, Non-Sterile 4x4 in (Home Health) 1 x Per Day/30 Days Discharge Instructions: Lightly pack in wound over silver alginate Secondary Dressing: ABD Pad, 8x10 (Home Health) 1 x Per Day/30 Days Discharge Instructions: Apply over primary dressing as directed. Secured With: 27M Medipore H Soft Cloth Surgical T 4 x 2 (in/yd) (Home Health) 1 x Per Day/30 Days ape Discharge Instructions: Secure dressing with tape as directed. Patient Medications llergies: No Known Allergies A Notifications Medication Indication Start End wound infection 10/13/2020 Augmentin DOSE oral 250 mg/5 mL-62.5 mg/5  mL suspension for reconstitution - augmentin 250/64ml 10 ml (500 mg) tid for 10 days Electronic Signature(s) Signed: 10/13/2020 11:59:16 AM By: Linton Ham MD Entered By: Linton Ham on 10/13/2020 11:59:16 -------------------------------------------------------------------------------- Problem List Details Patient Name: Date of Service: [name] 10/13/2020 10:45 A M Medical Record Number: [id number] Patient Account Number: [id number] Date of Birth/Sex: Treating RN: 11-24-1935 (85 y.o. [name], Lauren Primary Care Provider: [name] Other Clinician: Referring Provider: Treating Provider/Extender: Deloria Lair in Treatment: 1 Active  Problems ICD-10 Encounter Code Description Active Date MDM Diagnosis L89.320 Pressure ulcer of left buttock, unstageable 10/13/2020 No Yes E11.9 Type 2 diabetes mellitus without complications 10/14/1273 No Yes Inactive Problems Resolved Problems Electronic Signature(s) Signed: 10/13/2020 5:45:41 PM By: Linton Ham MD Entered By: Linton Ham on 10/13/2020 12:28:46 -------------------------------------------------------------------------------- Progress Note Details Patient Name: Date of Service: [name] 10/13/2020 10:45 A M Medical Record Number: [id number] Patient Account Number: [id number] Date of Birth/Sex: Treating RN: 1936/02/16 (85 y.o. [name] Primary Care Provider: [name] Other Clinician: Referring Provider: Treating Provider/Extender: Deloria Lair in Treatment: 1 Subjective History of Present Illness (HPI) Admission 5/27 Ms. Natsuko Kelsay is an 85 year old female with a past medical history of type 2 diabetes on oral agents, hypertension and dementia that presents today to the clinic for a wound to her right buttocks. Her daughter-in-law is present today and helps provide the history. This issue has been going on for 2 to 3 weeks  now. She states she developed an eschar about 1 week ago. She has been taking doxycycline for possible soft tissue infection to the area prescribed by her primary care provider. Patient currently denies any signs of infection. 6/3; patient admitted to the clinic last week. She has a large necrotic area on her left buttock. They use Santyl last week. On arrival this visit she had completely necrotic surface with open to subcutaneous tissue. The surface was completely nonviable. They have been using Santyl. She apparently had been on doxycycline which she is just finishing prescribed by her primary doctor for possible soft tissue infection 6/7; patient with a large necrotic wound over her left buttock. Aggressive debridement last week. Culture of this grew Proteus unfortunately would not of been covered well or at least predictably by the doxycycline that her primary doctor put her on. I have therefore put her on Augmentin suspension 3 times a day. We are using silver alginate as the primary dressing while we deal with the infection. Her daughter has a list of concerns including swallowing difficulties, concerns about aspiration. The patient has advanced dementia. If this is Alzheimer's disease it is at its preterminal stages. I went over that swallowing difficulties are part of what happens in the preterminal stages of Alzheimer's disease. Nevertheless we will family members seems to want to pursue a very aggressive course Objective Constitutional Sitting or standing Blood Pressure is within target range for patient.. Pulse regular and within target range for patient.Marland Kitchen Respirations regular, non-labored and within target range.. Temperature is normal and within the target range for the patient.Marland Kitchen Appears in no distress. Signs of weight loss. Vitals Time Taken: 11:17 AM, Height: 68 in, Weight: 110 lbs, BMI: 16.7, Temperature: 98.3 F, Pulse: 116 bpm, Respiratory Rate: 17 breaths/min, Blood Pressure:  128/79 mmHg, Capillary Blood Glucose: 170 mg/dl. Ears, Nose, Mouth, and Throat She would not let me look in her mouth. Cardiovascular Not grossly dehydrated. Gastrointestinal (GI) Abdomen is soft and non-distended without masses or tenderness.. No liver or spleen enlargement. General Notes: Wound exam; left buttock. Aggressive debridement last week. I used a #5 curette to complete this by enlarge this week. There is still some necrotic tissues on the edges but overall the surface of the wound looks better. This does not approximate bone but nevertheless is a deep wound. There is not overt surrounding soft tissue infection. Integumentary (Hair, Skin) Wound #2 status is Open. Original cause of wound was Pressure Injury. The date acquired was: 09/18/2020. The wound has been  in treatment 1 weeks. The wound is located on the Left Gluteus. The wound measures 7.5cm length x 6cm width x 2.1cm depth; 35.343cm^2 area and 74.22cm^3 volume. There is Fat Layer (Subcutaneous Tissue) exposed. There is no tunneling noted, however, there is undermining starting at 2:00 and ending at 4:00 with a maximum distance of 2cm. There is a medium amount of serosanguineous drainage noted. The wound margin is well defined and not attached to the wound base. There is medium (34-66%) granulation within the wound bed. There is a medium (34-66%) amount of necrotic tissue within the wound bed including Eschar and Adherent Slough. Assessment Active Problems ICD-10 Pressure ulcer of left buttock, unstageable Type 2 diabetes mellitus without complications Procedures Wound #2 Pre-procedure diagnosis of Wound #2 is a Pressure Ulcer located on the Left Gluteus . There was a Excisional Skin/Subcutaneous Tissue Debridement with a total area of 45 sq cm performed by Ricard Dillon., MD. With the following instrument(s): Curette to remove Viable and Non-Viable tissue/material. Material removed includes Subcutaneous Tissue, Slough,  Skin: Dermis, and Skin: Epidermis after achieving pain control using Lidocaine. No specimens were taken. A time out was conducted at 11:46, prior to the start of the procedure. A Minimum amount of bleeding was controlled with Pressure. The procedure was tolerated well with a pain level of 0 throughout and a pain level of 0 following the procedure. Post Debridement Measurements: 7.5cm length x 6cm width x 2.1cm depth; 74.22cm^3 volume. Post debridement Stage noted as Unstageable/Unclassified. Character of Wound/Ulcer Post Debridement is improved. Post procedure Diagnosis Wound #2: Same as Pre-Procedure Plan Follow-up Appointments: Return Appointment in 2 weeks. - Tuesday with Dr. Dellia Nims Other: - Pick up the antibiotics that we are calling in today. Off-Loading: Low air-loss mattress (Group 2) - Old one not working, new one ordered Turn and reposition every 2 hours Additional Orders / Instructions: Follow Nutritious Diet - 100-120g of Protein Home Health: No change in wound care orders this week; continue Home Health for wound care. May utilize formulary equivalent dressing for wound treatment orders unless otherwise specified. [facility] Monday's, Wednseday's, and Friday's The following medication(s) was prescribed: Augmentin oral 250 mg/5 mL-62.5 mg/5 mL suspension for reconstitution augmentin 250/34ml 10 ml (500 mg) tid for 10 days for wound infection starting 10/13/2020 WOUND #2: - Gluteus Wound Laterality: Left Cleanser: Soap and Water (Home Health) 1 x Per Day/30 Days Discharge Instructions: May shower and wash wound with dial antibacterial soap and water prior to dressing change. Prim Dressing: KerraCel Ag Gelling Fiber Dressing, 4x5 in (silver alginate) (Home Health) 1 x Per Day/30 Days ary Discharge Instructions: pack into wound bed Secondary Dressing: Woven Gauze Sponge, Non-Sterile 4x4 in (Home Health) 1 x Per Day/30 Days Discharge Instructions: Lightly pack in wound over silver  alginate Secondary Dressing: ABD Pad, 8x10 (Home Health) 1 x Per Day/30 Days Discharge Instructions: Apply over primary dressing as directed. Secured With: 12M Medipore H Soft Cloth Surgical T 4 x 2 (in/yd) (Home Health) 1 x Per Day/30 Days ape Discharge Instructions: Secure dressing with tape as directed. 1. Continue with silver alginate for at least another week. 2. E scribed Augmentin suspension 250/5 mL 10 mL 3 times daily for 10 days. This should cover the Proteus we cultured and any coexistent anaerobes 3. We are going to try to get home health to do a comprehensive metabolic panel CBC with differential sedimentation rate and C-reactive protein. 4. Again we have home health but they are not supplying dressing material  they family is having to purchase this 5. I think this patient has advanced dementia which may be on its preterminal stages. I talked to the family about this today but I do not think they quite understand where they are in the process of this illness if it is Alzheimer's disease plus or minus multi-infarct I am not sure but swallowing difficulties are often a precursor to entering the terminal phases 6. The patient does not look dehydrated but is showing signs of weight loss. I am going to see if I can get home health to do lab work as noted above Engineer, maintenance) Signed: 10/13/2020 5:45:41 PM By: Linton Ham MD Signed: 10/13/2020 5:45:41 PM By: Linton Ham MD Entered By: Linton Ham on 10/13/2020 12:38:29 -------------------------------------------------------------------------------- SuperBill Details Patient Name: Date of Service: MELAH, EBLING 10/13/2020 Medical Record Number: [id number] Patient Account Number: [id number] Date of Birth/Sex: Treating RN: [birth year] (85 y.o. [name], Lauren Primary Care Provider: [name] Other Clinician: Referring Provider: Treating Provider/Extender: Deloria Lair in Treatment:  1 Diagnosis Coding ICD-10 Codes Code Description L89.310 Pressure ulcer of right buttock, unstageable E11.9 Type 2 diabetes mellitus without complications Facility Procedures CPT4 Code: [id number] Description: 94327 - DEB SUBQ TISSUE 20 SQ CM/< ICD-10 Diagnosis Description L89.310 Pressure ulcer of right buttock, unstageable Modifier: Quantity: 1 CPT4 Code: [id number] Description: 57473 - DEB SUBQ TISS EA ADDL 20CM ICD-10 Diagnosis Description L89.310 Pressure ulcer of right buttock, unstageable Modifier: Quantity: 2 Physician Procedures : CPT4 Code Description Modifier [id number] 11042 - WC PHYS SUBQ TISS 20 SQ CM ICD-10 Diagnosis Description L89.310 Pressure ulcer of right buttock, unstageable Quantity: 1 : [id number] 40375 - WC PHYS SUBQ TISS EA ADDL 20 CM ICD-10 Diagnosis Description L89.310 Pressure ulcer of right buttock, unstageable Quantity: 2 Electronic Signature(s) Signed: 10/13/2020 5:45:41 PM By: Linton Ham MD Entered By: Linton Ham on 10/13/2020 12:38:40

## 2020-10-14 DIAGNOSIS — L89150 Pressure ulcer of sacral region, unstageable: Secondary | ICD-10-CM | POA: Diagnosis not present

## 2020-10-14 DIAGNOSIS — I890 Lymphedema, not elsewhere classified: Secondary | ICD-10-CM | POA: Diagnosis not present

## 2020-10-14 DIAGNOSIS — E785 Hyperlipidemia, unspecified: Secondary | ICD-10-CM | POA: Diagnosis not present

## 2020-10-14 DIAGNOSIS — L03312 Cellulitis of back [any part except buttock]: Secondary | ICD-10-CM | POA: Diagnosis not present

## 2020-10-14 DIAGNOSIS — L89320 Pressure ulcer of left buttock, unstageable: Secondary | ICD-10-CM | POA: Diagnosis not present

## 2020-10-14 DIAGNOSIS — F0390 Unspecified dementia without behavioral disturbance: Secondary | ICD-10-CM | POA: Diagnosis not present

## 2020-10-14 LAB — AEROBIC CULTURE W GRAM STAIN (SUPERFICIAL SPECIMEN)

## 2020-10-15 ENCOUNTER — Telehealth: Payer: Self-pay | Admitting: *Deleted

## 2020-10-15 NOTE — Telephone Encounter (Signed)
Will with [facility] Called requesting verbal orders. Stated that the daughter is requesting the OT to be postpone until the week of 6/20.   Verbal orders given.

## 2020-10-16 ENCOUNTER — Encounter (HOSPITAL_COMMUNITY): Payer: Self-pay | Admitting: Emergency Medicine

## 2020-10-16 ENCOUNTER — Emergency Department (HOSPITAL_COMMUNITY)
Admission: EM | Admit: 2020-10-16 | Discharge: 2020-10-17 | Disposition: A | Discharge status: Home / Self Care | Payer: Medicare Other | Attending: Emergency Medicine | Admitting: Emergency Medicine

## 2020-10-16 ENCOUNTER — Emergency Department (HOSPITAL_COMMUNITY): Payer: Medicare Other

## 2020-10-16 ENCOUNTER — Telehealth: Payer: Self-pay

## 2020-10-16 DIAGNOSIS — R109 Unspecified abdominal pain: Secondary | ICD-10-CM | POA: Diagnosis not present

## 2020-10-16 DIAGNOSIS — R000 Tachycardia, unspecified: Secondary | ICD-10-CM | POA: Insufficient documentation

## 2020-10-16 DIAGNOSIS — I10 Essential (primary) hypertension: Secondary | ICD-10-CM | POA: Insufficient documentation

## 2020-10-16 DIAGNOSIS — Z7901 Long term (current) use of anticoagulants: Secondary | ICD-10-CM | POA: Insufficient documentation

## 2020-10-16 DIAGNOSIS — Z7984 Long term (current) use of oral hypoglycemic drugs: Secondary | ICD-10-CM | POA: Insufficient documentation

## 2020-10-16 DIAGNOSIS — R0902 Hypoxemia: Secondary | ICD-10-CM | POA: Diagnosis not present

## 2020-10-16 DIAGNOSIS — R0602 Shortness of breath: Secondary | ICD-10-CM | POA: Diagnosis not present

## 2020-10-16 DIAGNOSIS — R059 Cough, unspecified: Secondary | ICD-10-CM | POA: Diagnosis not present

## 2020-10-16 DIAGNOSIS — F03C0 Unspecified dementia, severe, without behavioral disturbance, psychotic disturbance, mood disturbance, and anxiety: Secondary | ICD-10-CM

## 2020-10-16 DIAGNOSIS — J9811 Atelectasis: Secondary | ICD-10-CM | POA: Diagnosis not present

## 2020-10-16 DIAGNOSIS — E1142 Type 2 diabetes mellitus with diabetic polyneuropathy: Secondary | ICD-10-CM | POA: Diagnosis not present

## 2020-10-16 DIAGNOSIS — L89150 Pressure ulcer of sacral region, unstageable: Secondary | ICD-10-CM | POA: Diagnosis not present

## 2020-10-16 DIAGNOSIS — Z8673 Personal history of transient ischemic attack (TIA), and cerebral infarction without residual deficits: Secondary | ICD-10-CM | POA: Insufficient documentation

## 2020-10-16 DIAGNOSIS — I890 Lymphedema, not elsewhere classified: Secondary | ICD-10-CM | POA: Diagnosis not present

## 2020-10-16 DIAGNOSIS — F0390 Unspecified dementia without behavioral disturbance: Secondary | ICD-10-CM | POA: Insufficient documentation

## 2020-10-16 DIAGNOSIS — L89300 Pressure ulcer of unspecified buttock, unstageable: Secondary | ICD-10-CM | POA: Diagnosis not present

## 2020-10-16 DIAGNOSIS — Z79899 Other long term (current) drug therapy: Secondary | ICD-10-CM | POA: Diagnosis not present

## 2020-10-16 DIAGNOSIS — Z20822 Contact with and (suspected) exposure to covid-19: Secondary | ICD-10-CM | POA: Diagnosis not present

## 2020-10-16 DIAGNOSIS — R404 Transient alteration of awareness: Secondary | ICD-10-CM | POA: Diagnosis not present

## 2020-10-16 DIAGNOSIS — J80 Acute respiratory distress syndrome: Secondary | ICD-10-CM | POA: Diagnosis not present

## 2020-10-16 DIAGNOSIS — L03312 Cellulitis of back [any part except buttock]: Secondary | ICD-10-CM | POA: Diagnosis not present

## 2020-10-16 DIAGNOSIS — R0989 Other specified symptoms and signs involving the circulatory and respiratory systems: Secondary | ICD-10-CM | POA: Insufficient documentation

## 2020-10-16 DIAGNOSIS — L89329 Pressure ulcer of left buttock, unspecified stage: Secondary | ICD-10-CM

## 2020-10-16 DIAGNOSIS — L89320 Pressure ulcer of left buttock, unstageable: Secondary | ICD-10-CM | POA: Diagnosis not present

## 2020-10-16 DIAGNOSIS — E785 Hyperlipidemia, unspecified: Secondary | ICD-10-CM | POA: Diagnosis not present

## 2020-10-16 LAB — LACTIC ACID, PLASMA
Lactic Acid, Venous: 1.9 mmol/L (ref 0.5–1.9)
Lactic Acid, Venous: 1 mmol/L (ref 0.5–1.9)

## 2020-10-16 LAB — URINALYSIS, ROUTINE W REFLEX MICROSCOPIC
Bilirubin Urine: NEGATIVE
Glucose, UA: NEGATIVE mg/dL
Hgb urine dipstick: NEGATIVE
Ketones, ur: 5 mg/dL — AB
Nitrite: NEGATIVE
Protein, ur: NEGATIVE mg/dL
Specific Gravity, Urine: 1.015 (ref 1.005–1.030)
pH: 5 (ref 5.0–8.0)

## 2020-10-16 LAB — CBC WITH DIFFERENTIAL/PLATELET
Abs Immature Granulocytes: 0.03 10*3/uL (ref 0.00–0.07)
Basophils Absolute: 0 10*3/uL (ref 0.0–0.1)
Basophils Relative: 0 %
Eosinophils Absolute: 0 10*3/uL (ref 0.0–0.5)
Eosinophils Relative: 0 %
HCT: 27.1 % — ABNORMAL LOW (ref 36.0–46.0)
Hemoglobin: 8.4 g/dL — ABNORMAL LOW (ref 12.0–15.0)
Immature Granulocytes: 0 %
Lymphocytes Relative: 19 %
Lymphs Abs: 1.6 10*3/uL (ref 0.7–4.0)
MCH: 28.9 pg (ref 26.0–34.0)
MCHC: 31 g/dL (ref 30.0–36.0)
MCV: 93.1 fL (ref 80.0–100.0)
Monocytes Absolute: 0.7 10*3/uL (ref 0.1–1.0)
Monocytes Relative: 9 %
Neutro Abs: 5.8 10*3/uL (ref 1.7–7.7)
Neutrophils Relative %: 72 %
Platelets: UNDETERMINED 10*3/uL (ref 150–400)
RBC: 2.91 MIL/uL — ABNORMAL LOW (ref 3.87–5.11)
RDW: 14.7 % (ref 11.5–15.5)
WBC: 8.1 10*3/uL (ref 4.0–10.5)
nRBC: 0 % (ref 0.0–0.2)

## 2020-10-16 LAB — RESP PANEL BY RT-PCR (FLU A&B, COVID) ARPGX2
Influenza A by PCR: NEGATIVE
Influenza B by PCR: NEGATIVE
SARS Coronavirus 2 by RT PCR: NEGATIVE

## 2020-10-16 LAB — PROTIME-INR
INR: 1.1 (ref 0.8–1.2)
Prothrombin Time: 13.9 seconds (ref 11.4–15.2)

## 2020-10-16 LAB — APTT: aPTT: 21 seconds — ABNORMAL LOW (ref 24–36)

## 2020-10-16 MED ORDER — SODIUM CHLORIDE 0.9 % IV SOLN
1.0000 g | Freq: Once | INTRAVENOUS | Status: AC
  Administered 2020-10-16: 1 g via INTRAVENOUS
  Filled 2020-10-16: qty 10

## 2020-10-16 MED ORDER — AZITHROMYCIN 250 MG PO TABS: 250.0000 mg | ORAL_TABLET | Freq: Every day | ORAL | 0 refills | Status: AC

## 2020-10-16 MED ORDER — SODIUM CHLORIDE 0.9 % IV BOLUS
500.0000 mL | Freq: Once | INTRAVENOUS | Status: AC
  Administered 2020-10-16: 500 mL via INTRAVENOUS

## 2020-10-16 MED ORDER — SODIUM CHLORIDE 0.9 % IV SOLN: 1.0000 g | Freq: Once | INTRAVENOUS | Status: DC

## 2020-10-16 MED ORDER — SODIUM CHLORIDE 0.9 % IV SOLN
500.0000 mg | Freq: Once | INTRAVENOUS | Status: DC
  Filled 2020-10-16: qty 500

## 2020-10-16 MED ORDER — CEFDINIR 300 MG PO CAPS: 300.0000 mg | ORAL_CAPSULE | Freq: Two times a day (BID) | ORAL | 0 refills | Status: DC

## 2020-10-16 MED ORDER — SODIUM CHLORIDE 0.9 % IV SOLN
500.0000 mg | Freq: Once | INTRAVENOUS | Status: AC
  Administered 2020-10-16: 500 mg via INTRAVENOUS

## 2020-10-16 NOTE — Telephone Encounter (Signed)
[name] Nurse with [facility] called regarding patient's O2 sats. When she first arrive O2 was in low 70's was able to ge it up to 85% but no higher. Patient using accesory muscles to breath has wet cough. Pulse running between 102-115. She is recommending to send patient out to ER and is notifying daughter-in-law of this recommendation. She would like to know if you agree with thisrecommendation or do you suggest something else. Message was left on clinical intake voicemail. Please advise.  Message routed to [name], NP

## 2020-10-16 NOTE — ED Provider Notes (Addendum)
Patient signed out that after coughing/clearing mucous, pt is breathing comfortably with o2 sats now 99-100% on room air, to check labs, and probable d/c to home.   Labs reviewed - UA w 0-5 wbc. Lactic acid 1 - normal. Covid and flu neg.   On recheck pt is breathing comfortably. Care giver indicates mental status c/w baseline. Pt appears in no acute distress or acute discomfort.   ?bronchitis vs early pna.   Rx zithromax (on review Epic notes, pt recently started on augmentin for pressure sore - will complete course - also rec close f/u with her wound care specialist and pcp for same).   Pt currently appears stable for d/c - no recurrence of resp difficulty, no problems w choking or managing secretions.   Rec pcp f/u.  Return precautions provided.      Lajean Saver, MD 10/16/20 2211

## 2020-10-16 NOTE — Discharge Instructions (Addendum)
It was our pleasure to provide your ER care today - we hope that you feel better.  Give zithromax (antibiotic) as prescribed. Complete the course of your augmentin. Aspiration precautions.   Follow up with primary care doctor in the coming week. Also follow up closely with your wound care specialist in the coming week.   Return to ER if worse, new symptoms, increased trouble breathing, persistent vomiting, new or severe pain, worsening appearance of decub/wound, spreading redness, or other concern.

## 2020-10-16 NOTE — ED Provider Notes (Signed)
[facility] EMERGENCY DEPARTMENT Provider Note   CSN: [id number] Arrival date & time: 10/16/20  1407     History Chief Complaint  Patient presents with   Shortness of Breath    [name] is a 85 y.o. female.  HPI  85 year old female with past medical history of HTN, HLD, DVT anticoagulant on Xarelto, DM, dementia who is nonverbal from a facility presents the emergency department concern for infection and possible sepsis.  Patient has noncontributory to his history.  Report from the facility is that the patient was tachycardic and hypoxic to the 70s on room air.  When EMS arrived she was hypoxic into the 70s, placed on a nonrebreather.  While they were getting ready to move the patient had a large episode of coughing up mucus and her oxygenation returned to normal.  Of note she does have a known sacral decubitus ulcer that she is on oral antibiotics for.  No reported fever from the facility.  No known vomiting or diarrhea.  Past Medical History:  Diagnosis Date   Dementia ([place])    Diabetes mellitus type II 03/21/2011   DVT (deep venous thrombosis) ([place]) 03/21/2011   Hyperlipidemia 03/21/2011   Hypertension 03/21/2011   Lymphedema of leg 03/21/2011   Pulmonary embolism ([place]) 03/21/2011   Stroke [facility])     Patient Active Problem List   Diagnosis Date Noted   Pressure injury of skin 05/04/2019   Skin ulcer of sacrum with fat layer exposed ([place]) 07/22/2017   Lipoma of left lower extremity 07/22/2017   Estrogen deficiency 07/22/2017   Hyperlipidemia associated with type 2 diabetes mellitus ([place]) 07/22/2017   History of TIA (transient ischemic attack) 01/10/2017   Atrial septal aneurysm 11/22/2016   Bilateral lower extremity edema 11/22/2016   Iron deficiency anemia due to chronic blood loss 11/16/2016   TIA (transient ischemic attack) 10/05/2016   Current use of long term anticoagulation 14/97/0263   Metabolic encephalopathy 78/58/8502   Senile dementia  without behavioral disturbance ([place]) 08/23/2016   Acute CVA (cerebrovascular accident) ([place])    Left-sided weakness 05/11/2016   Dysarthria 05/11/2016   Acute left-sided weakness 05/11/2016   History of pulmonary embolism 03/21/2011   History of DVT (deep vein thrombosis) 03/21/2011   Type 2 diabetes mellitus with diabetic polyneuropathy, without long-term current use of insulin ([place]) 03/21/2011   Hyperlipidemia 03/21/2011   Hypertension 03/21/2011    Past Surgical History:  Procedure Laterality Date   CHOLECYSTECTOMY  2015   SKIN TAG REMOVAL  07/25/2016   VAGINAL HYSTERECTOMY     unknown of date   VASCULAR SURGERY       OB History     Gravida  3   Para  3   Term  3   Preterm      AB      Living  3      SAB      IAB      Ectopic      Multiple      Live Births  3           Family History  Problem Relation Age of Onset   Heart attack Mother    COPD Sister    Stroke Sister    Bipolar disorder Daughter     Social History   Tobacco Use   Smoking status: Never   Smokeless tobacco: Never  Vaping Use   Vaping Use: Never used  Substance Use Topics   Alcohol use: No  Drug use: No    Home Medications Prior to Admission medications   Medication Sig Start Date End Date Taking? Authorizing Provider  A&D OINT Apply 1 application topically as needed.     [provider]  Accu-Chek Softclix Lancets lancets 1 each by Other route 2 (two) times daily. Use as instructed Dx: E11.9 09/01/20   [name], NP  acetaminophen (TYLENOL) 500 MG tablet Take 500 mg by mouth every 8 (eight) hours as needed (for general pain).     [provider]  atorvastatin (LIPITOR) 40 MG tablet Take 1 tablet (40 mg total) by mouth daily at 6 PM. 09/01/20   Dewaine Oats, Carlos American, NP  cephALEXin (KEFLEX) 500 MG capsule Take 1 capsule (500 mg total) by mouth 4 (four) times daily. 09/30/20   Charlann Lange, PA-C  Cholecalciferol (VITAMIN D-3) 125 MCG (5000 UT) TABS  Take 5,000 Units by mouth daily.    [provider]  cloNIDine (CATAPRES) 0.1 MG tablet Take 1 tablet (0.1 mg total) by mouth 2 (two) times daily. May take an additional tablet if needed SBP >170 09/28/20   [name], NP  gabapentin (NEURONTIN) 300 MG capsule Take 1 capsule (300 mg total) by mouth 2 (two) times daily. 09/01/20   [name], NP  glucose blood (ACCU-CHEK AVIVA PLUS) test strip USE STRIP(S) TO TEST TWICE A DAY - (KEEP UNUSED STRIPS IN ORIGINAL SEALED CONTAINER BETWEEN USES) 09/01/20   [name], NP  losartan (COZAAR) 25 MG tablet Take one tablet by mouth once daily, take along with 50mg  to equal 75mg  09/01/20   [name], NP  losartan (COZAAR) 50 MG tablet Take one tablet by mouth once daily, along with 25mg  to equal 75mg  09/01/20   [name], NP  metFORMIN (GLUCOPHAGE) 500 MG tablet Take 1 tablet (500 mg total) by mouth 2 (two) times daily with a meal. 09/01/20   [name], NP  NON FORMULARY Take 1 capsule by mouth See admin instructions. Metagenics - UltraFlora Balance Daily Probiotic Immune Support* capsules- Take 1 capsule by mouth once a day    [provider]  polyethylene glycol (MIRALAX / GLYCOLAX) packet Take 17 g by mouth daily as needed for mild constipation (MIX AND DRINK).     [provider]  Rivaroxaban (XARELTO) 15 MG TABS tablet Take one tablet by mouth once daily with supper 09/01/20   [name], NP    Allergies    Patient has no known allergies.  Review of Systems   Review of Systems  Unable to perform ROS: Patient nonverbal   Physical Exam Updated Vital Signs BP (!) 149/72 (BP Location: Right Arm)   Pulse (!) 108   Temp 100 F (37.8 C) (Oral)   Resp (!) 25   SpO2 96%   Physical Exam Vitals and nursing note reviewed.  Constitutional:      Appearance: Normal appearance.     Comments: Nonverbal, sometimes localizes to me with her eyes when her name is called  HENT:      Head: Normocephalic.     Mouth/Throat:     Mouth: Mucous membranes are moist.  Cardiovascular:     Rate and Rhythm: Tachycardia present.  Pulmonary:     Effort: Pulmonary effort is normal. Tachypnea present. No accessory muscle usage.     Breath sounds: Rales present.  Abdominal:     Palpations: Abdomen is soft.  Skin:    General: Skin is warm.  Comments: Known decubitus ulcer  Neurological:     Mental Status: She is alert. Mental status is at baseline.    ED Results / Procedures / Treatments   Labs (all labs ordered are listed, but only abnormal results are displayed) Labs Reviewed  CULTURE, BLOOD (ROUTINE X 2)  CULTURE, BLOOD (ROUTINE X 2)  CBC WITH DIFFERENTIAL/PLATELET  COMPREHENSIVE METABOLIC PANEL  LACTIC ACID, PLASMA  LACTIC ACID, PLASMA  URINALYSIS, ROUTINE W REFLEX MICROSCOPIC    EKG None  Radiology No results found.  Procedures Procedures   Medications Ordered in ED Medications  sodium chloride 0.9 % bolus 500 mL (has no administration in time range)    ED Course  I have reviewed the triage vital signs and the nursing notes.  Pertinent labs & imaging results that were available during my care of the patient were reviewed by me and considered in my medical decision making (see chart for details).    MDM Rules/Calculators/A&P                          85 year old female presents the emergency department with concern for infection and hypoxia.  On arrival patient has normal oxygenation on room air.  She is tachycardic and at times can tachypneic.  Nonverbal which is baseline, not contributory towards her history.  She does have scattered rales on lung auscultation.  No respiratory distress.  Abdomen is soft, she is contracted and leaning to the left which is also baseline.  Will evaluate for possible sepsis/infection.  Patient signed out to oncoming provider pending evaluation.  Final Clinical Impression(s) / ED Diagnoses Final diagnoses:  None     Rx / DC Orders ED Discharge Orders     None        Lorelle Gibbs, DO 10/16/20 1503

## 2020-10-16 NOTE — ED Notes (Signed)
Urine able to be obtained without use of in and out.

## 2020-10-16 NOTE — ED Triage Notes (Signed)
Pt comes from home via EMS. Pt has home health who reported that pt was not breathing well, snoring respirations, O2 in the 70's. EMS arrived confirmed O2 in 70's. Please on NRB and repositioned airway. Pt coughed and cleared mucus, O2 rose to 100% on RA. Pt in constant vegetative state, has sacral ulcer for which she receives PO meds. Pt at baseline nonverbal state at this time. Pt will moan and make eye contact at this time which is her baseline.22g IV established in R hand. Pt has DNR paperwork at bedside BGL 181 BP 150/palp HR 110 RR 24 O2 100 RA

## 2020-10-16 NOTE — ED Notes (Signed)
Pure wick on pt

## 2020-10-18 LAB — URINE CULTURE: Culture: 200 — AB

## 2020-10-19 ENCOUNTER — Ambulatory Visit: Payer: Self-pay | Admitting: Family

## 2020-10-19 ENCOUNTER — Encounter: Payer: Self-pay | Admitting: Nurse Practitioner

## 2020-10-19 ENCOUNTER — Ambulatory Visit (INDEPENDENT_AMBULATORY_CARE_PROVIDER_SITE_OTHER): Payer: Medicare Other | Admitting: Nurse Practitioner

## 2020-10-19 ENCOUNTER — Other Ambulatory Visit: Payer: Self-pay

## 2020-10-19 ENCOUNTER — Telehealth: Payer: Self-pay | Admitting: *Deleted

## 2020-10-19 VITALS — BP 148/92 | HR 76 | Temp 97.6°F

## 2020-10-19 DIAGNOSIS — B3731 Acute candidiasis of vulva and vagina: Secondary | ICD-10-CM

## 2020-10-19 DIAGNOSIS — I890 Lymphedema, not elsewhere classified: Secondary | ICD-10-CM | POA: Diagnosis not present

## 2020-10-19 DIAGNOSIS — L03319 Cellulitis of trunk, unspecified: Secondary | ICD-10-CM | POA: Diagnosis not present

## 2020-10-19 DIAGNOSIS — I10 Essential (primary) hypertension: Secondary | ICD-10-CM | POA: Diagnosis not present

## 2020-10-19 DIAGNOSIS — L89150 Pressure ulcer of sacral region, unstageable: Secondary | ICD-10-CM | POA: Diagnosis not present

## 2020-10-19 DIAGNOSIS — B373 Candidiasis of vulva and vagina: Secondary | ICD-10-CM | POA: Diagnosis not present

## 2020-10-19 DIAGNOSIS — J189 Pneumonia, unspecified organism: Secondary | ICD-10-CM

## 2020-10-19 DIAGNOSIS — L89320 Pressure ulcer of left buttock, unstageable: Secondary | ICD-10-CM | POA: Diagnosis not present

## 2020-10-19 DIAGNOSIS — L03312 Cellulitis of back [any part except buttock]: Secondary | ICD-10-CM | POA: Diagnosis not present

## 2020-10-19 DIAGNOSIS — E785 Hyperlipidemia, unspecified: Secondary | ICD-10-CM | POA: Diagnosis not present

## 2020-10-19 DIAGNOSIS — F0390 Unspecified dementia without behavioral disturbance: Secondary | ICD-10-CM | POA: Diagnosis not present

## 2020-10-19 NOTE — Progress Notes (Signed)
Careteam: Patient Care Team: [name], NP as PCP - General (Geriatric Medicine) [name], MD as Consulting Physician (Oncology) [name], [name], LCSW as Social Worker (Licensed Clinical Social Worker) [name], RN as Registered Nurse (Hospice and Palliative Medicine)  PLACE OF SERVICE:  [place] Directive information    No Known Allergies  Chief Complaint  Patient presents with   Acute Visit    Possible vaginal yeast infection. Patient also following up from ER      HPI: Patient is a 85 y.o. female to follow up from ED and evaluation of yeast.   On 6/10 she went to ED due to decrease in O2 and cough and congestion- thought to possibly have early pneumonia she was started on azithromycin- she was already on Augmentin for pressure injury being followed closely by wound care center.  No fevers or chills. [name] reports she felt badly for several days but doing better at this time.  No shortness of breath, increase in cough or congestion. O2 sat being monitored at home and has been maintaining over 92%.   [name] reports her blood pressure as been on the low side- holding all bp medication at this time.   She is eating and drinking better at this time.    Review of Systems:  Review of Systems  Unable to perform ROS: Dementia   Past Medical History:  Diagnosis Date   Dementia ([place])    Diabetes mellitus type II 03/21/2011   DVT (deep venous thrombosis) ([place]) 03/21/2011   Hyperlipidemia 03/21/2011   Hypertension 03/21/2011   Lymphedema of leg 03/21/2011   Pulmonary embolism ([place]) 03/21/2011   Stroke [facility])    Past Surgical History:  Procedure Laterality Date   CHOLECYSTECTOMY  2015   SKIN TAG REMOVAL  07/25/2016   VAGINAL HYSTERECTOMY     unknown of date   VASCULAR SURGERY     Social History:   reports that she has never smoked. She has never used smokeless tobacco. She reports that she does not drink alcohol  and does not use drugs.  Family History  Problem Relation Age of Onset   Heart attack Mother    COPD Sister    Stroke Sister    Bipolar disorder Daughter     Medications: Patient's Medications  New Prescriptions   No medications on file  Previous Medications   A&D OINT    Apply 1 application topically as needed.    ACCU-CHEK SOFTCLIX LANCETS LANCETS    1 each by Other route 2 (two) times daily. Use as instructed Dx: E11.9   ACETAMINOPHEN (TYLENOL) 500 MG TABLET    Take 500 mg by mouth every 8 (eight) hours as needed (for general pain).    AMOXICILLIN-CLAVULANATE (AUGMENTIN) 250-62.5 MG/5ML SUSPENSION    Take 250 mg by mouth 3 (three) times daily. RX'ed by Dr.Robson at the wound center   ATORVASTATIN (LIPITOR) 40 MG TABLET    Take 1 tablet (40 mg total) by mouth daily at 6 PM.   AZITHROMYCIN (ZITHROMAX Z-PAK) 250 MG TABLET    Take 1 tablet (250 mg total) by mouth daily for 4 days.   CHOLECALCIFEROL (VITAMIN D-3) 125 MCG (5000 UT) TABS    Take 5,000 Units by mouth daily.   CLONIDINE (CATAPRES) 0.1 MG TABLET    Take 1 tablet (0.1 mg total) by mouth 2 (two) times daily. May take an additional tablet if needed SBP >170  GABAPENTIN (NEURONTIN) 300 MG CAPSULE    Take 1 capsule (300 mg total) by mouth 2 (two) times daily.   GLUCOSE BLOOD (ACCU-CHEK AVIVA PLUS) TEST STRIP    USE STRIP(S) TO TEST TWICE A DAY - (KEEP UNUSED STRIPS IN ORIGINAL SEALED CONTAINER BETWEEN USES)   LOSARTAN (COZAAR) 25 MG TABLET    Take one tablet by mouth once daily, take along with 50mg  to equal 75mg    LOSARTAN (COZAAR) 50 MG TABLET    Take one tablet by mouth once daily, along with 25mg  to equal 75mg    METFORMIN (GLUCOPHAGE) 500 MG TABLET    Take 1 tablet (500 mg total) by mouth 2 (two) times daily with a meal.   NON FORMULARY    Take 1 capsule by mouth See admin instructions. Metagenics - UltraFlora Balance Daily Probiotic Immune Support* capsules- Take 1 capsule by mouth once a day   POLYETHYLENE GLYCOL (MIRALAX /  GLYCOLAX) PACKET    Take 17 g by mouth daily as needed for mild constipation (MIX AND DRINK).    RIVAROXABAN (XARELTO) 15 MG TABS TABLET    Take one tablet by mouth once daily with supper  Modified Medications   No medications on file  Discontinued Medications   CEPHALEXIN (KEFLEX) 500 MG CAPSULE    Take 1 capsule (500 mg total) by mouth 4 (four) times daily.    Physical Exam:  Vitals:   10/19/20 1425  BP: (!) 148/92  Pulse: 76  Temp: 97.6 F (36.4 C)  TempSrc: Temporal   There is no height or weight on file to calculate BMI. Wt Readings from Last 3 Encounters:  05/04/19 130 lb 1.1 oz (59 kg)  11/02/18 130 lb (59 kg)  01/30/18 130 lb (59 kg)    Physical Exam Constitutional:      Comments: nonverbal  HENT:     Head: Normocephalic and atraumatic.  Cardiovascular:     Rate and Rhythm: Normal rate and regular rhythm.  Pulmonary:     Breath sounds: Rhonchi (to lower lobes) present.  Abdominal:     General: Abdomen is flat.  Musculoskeletal:     Cervical back: Normal range of motion and neck supple.  Skin:    General: Skin is warm and dry.     Findings: No rash.     Comments: Dressing noted to sacrum.   White discharge noted to vaginal area and skin changes consistent with yeast to labia.   Neurological:     Mental Status: She is alert.  Psychiatric:        Mood and Affect: Mood normal.        Behavior: Behavior normal.    Labs reviewed: Basic Metabolic Panel: Recent Labs    02/26/20 1154 07/06/20 1632 09/30/20 1819  NA 139 134* 133*  K 4.9 4.8 4.4  CL 100 99 98  CO2 27 22 24   GLUCOSE 209* 127 173*  BUN 26* 22 20  CREATININE 0.62 0.54* 0.47  CALCIUM 9.8 8.9 9.0   Liver Function Tests: Recent Labs    02/26/20 1154 07/06/20 1632 09/30/20 1819  AST 17 12 16   ALT 18 9 13   ALKPHOS  --   --  58  BILITOT 0.4 0.3 0.4  PROT 7.0 6.3 6.8  ALBUMIN  --   --  2.9*   No results for input(s): LIPASE, AMYLASE in the last 8760 hours. No results for input(s):  AMMONIA in the last 8760 hours. CBC: Recent Labs    07/06/20 1632 09/30/20  1819 10/16/20 1457  WBC 4.6 9.3 8.1  NEUTROABS 2,075 7.2 5.8  HGB 9.5* 9.6* 8.4*  HCT 29.2* 31.6* 27.1*  MCV 89.3 92.7 93.1  PLT 207 331 PLATELET CLUMPS NOTED ON SMEAR, UNABLE TO ESTIMATE   Lipid Panel: Recent Labs    02/26/20 1154  CHOL 171  HDL 49*  LDLCALC 103*  TRIG 101  CHOLHDL 3.5   TSH: No results for input(s): TSH in the last 8760 hours. A1C: Lab Results  Component Value Date   HGBA1C 7.3 (H) 07/06/2020     Assessment/Plan 1. Vaginal yeast infection -has had multiple antibiotics recently. will have daughter in law use monostat otc 7 day supply. Keep area clean and dry. -to start probiotic twice daily  2. Primary hypertension -not taking taking medication at this time and bp controlled without medications. She is closely monitoring bp and will restart losartan if bp starts to rise.   3. Pneumonia of left lung due to infectious organism, unspecified part of lung -continues on Augmentin and azithromycin. Overall doing better at this time. Continues to be followed by home health.   4. Cellulitis of sacral region Followed by wound care, continues on Augmentin.    Next appt: 11/10/2020 Carlos American. [place], [facility] [phone]

## 2020-10-19 NOTE — Telephone Encounter (Signed)
Post ED Visit - Positive Culture Follow-up  Culture report reviewed by antimicrobial stewardship pharmacist: [facility] Team []  Elenor Quinones, Pharm.D. []  Heide Guile, Pharm.D., BCPS AQ-ID []  Parks Neptune, Pharm.D., BCPS []  Alycia Rossetti, Pharm.D., BCPS []  [place], Pharm.D., BCPS, AAHIVP []  Legrand Como, Pharm.D., BCPS, AAHIVP []  Salome Arnt, PharmD, BCPS []  Johnnette Gourd, PharmD, BCPS []  Hughes Better, PharmD, BCPS []  Leeroy Cha, PharmD []  Laqueta Linden, PharmD, BCPS []  Albertina Parr, PharmD  [place] Team []  Leodis Sias, PharmD []  Lindell Spar, PharmD []  Royetta Asal, PharmD []  Graylin Shiver, Rph []  Rema Fendt) Glennon Mac, PharmD []  Arlyn Dunning, PharmD []  Netta Cedars, PharmD []  Dia Sitter, PharmD []  Leone Haven, PharmD []  Gretta Arab, PharmD []  Theodis Shove, PharmD []  Peggyann Juba, PharmD []  Reuel Boom, PharmD   Positive urine culture Likely contaminant and no further patient follow-up is required at this time. Sylvester Harder, PharmD  Harlon Flor Talley 10/19/2020, 10:52 AM

## 2020-10-19 NOTE — Patient Instructions (Signed)
To use monostat 7 day over the counter apply vaginally and to surrounding tissues.  May need to repeat if not fully cleared

## 2020-10-21 DIAGNOSIS — E785 Hyperlipidemia, unspecified: Secondary | ICD-10-CM | POA: Diagnosis not present

## 2020-10-21 DIAGNOSIS — L89320 Pressure ulcer of left buttock, unstageable: Secondary | ICD-10-CM | POA: Diagnosis not present

## 2020-10-21 DIAGNOSIS — L03312 Cellulitis of back [any part except buttock]: Secondary | ICD-10-CM | POA: Diagnosis not present

## 2020-10-21 DIAGNOSIS — F0390 Unspecified dementia without behavioral disturbance: Secondary | ICD-10-CM | POA: Diagnosis not present

## 2020-10-21 DIAGNOSIS — I890 Lymphedema, not elsewhere classified: Secondary | ICD-10-CM | POA: Diagnosis not present

## 2020-10-21 DIAGNOSIS — L89150 Pressure ulcer of sacral region, unstageable: Secondary | ICD-10-CM | POA: Diagnosis not present

## 2020-10-21 LAB — CULTURE, BLOOD (ROUTINE X 2)
Culture: NO GROWTH
Culture: NO GROWTH
Special Requests: ADEQUATE

## 2020-10-21 NOTE — Progress Notes (Signed)
COMMUNITY PALLIATIVE CARE RN NOTE  PATIENT NAME: [name] DOB: 03/09/1936 MRN: [id number]  PRIMARY CARE PROVIDER: [name], NP  RESPONSIBLE PARTY:  Acct ID - Guarantor Home Phone Work Phone Relationship Acct Type  [id number] Carolyn Meckel220 [phone]  Self P/F     [address], [place], [place] [address]   Due to the COVID-19 crisis, this virtual check-in visit was done via telephone from my office and it was initiated and consent by this patient and or family.  PLAN OF CARE and INTERVENTION:  ADVANCE CARE PLANNING/GOALS OF CARE: Goal is for patient to remain in the home with her son and daughter-in-law. She has a DNR. PATIENT/CAREGIVER EDUCATION: Symptom management DISEASE STATUS: Virtual check-in visit completed via telephone. [name] is concerned as patient has seemed to have a change in condition. She has been more lethargic and started pocketing food in her mouth. She did not eat well for dinner last night or breakfast this morning. She is giving softer foods to help. She tried to give patient half of a peanut butter and jelly sandwich and she held it in her mouth and it began to liquify. As the day progressed today, her alertness and intake was slightly improved. She continues with softer foods and smoothies. She also reports that patient has an open wound on her sacrum which she says is the worse that it has ever been. It started bleeding over the weekend and she was unable to stop the bleeding, so had to contact EMS. They came and was able to apply a pressure dressing which did help. She has an appointment with the [facility] tomorrow. She does have a home health nurse that performs the dressing changes as ordered. She is keeping patient on her side to relieve pressure. She continues to have hired caregivers 7 days a week to assist with personal care needs. She is total care with all ADLs. She remains incontinent of both bowel and bladder and wears adult briefs. She was  recently seen in the ED on 09/30/20 for altered mental status and was found to have a UTI. She was discharged with oral antibiotics, Keflex 4 times/day. Will continue to monitor.  HISTORY OF PRESENT ILLNESS:  This is a 85 yo female with a diagnosis of dementia without behavioral disturbances. She has a hx of CVA, TIA, DM II, HLD and left sided weakness,  Palliative care team continues to follow patient and visits monthly and PRN.  CODE STATUS: DNR ADVANCED DIRECTIVES: Y MOST FORM: no PPS: 30%   (Duration of visit and documentation 30 minutes)   Carolyn [place], RN BSN

## 2020-10-23 ENCOUNTER — Encounter (HOSPITAL_BASED_OUTPATIENT_CLINIC_OR_DEPARTMENT_OTHER): Payer: Medicare Other | Admitting: Internal Medicine

## 2020-10-23 DIAGNOSIS — F0390 Unspecified dementia without behavioral disturbance: Secondary | ICD-10-CM | POA: Diagnosis not present

## 2020-10-23 DIAGNOSIS — L03312 Cellulitis of back [any part except buttock]: Secondary | ICD-10-CM | POA: Diagnosis not present

## 2020-10-23 DIAGNOSIS — E785 Hyperlipidemia, unspecified: Secondary | ICD-10-CM | POA: Diagnosis not present

## 2020-10-23 DIAGNOSIS — L89150 Pressure ulcer of sacral region, unstageable: Secondary | ICD-10-CM | POA: Diagnosis not present

## 2020-10-23 DIAGNOSIS — L89320 Pressure ulcer of left buttock, unstageable: Secondary | ICD-10-CM | POA: Diagnosis not present

## 2020-10-23 DIAGNOSIS — I890 Lymphedema, not elsewhere classified: Secondary | ICD-10-CM | POA: Diagnosis not present

## 2020-10-26 DIAGNOSIS — L03312 Cellulitis of back [any part except buttock]: Secondary | ICD-10-CM | POA: Diagnosis not present

## 2020-10-26 DIAGNOSIS — I890 Lymphedema, not elsewhere classified: Secondary | ICD-10-CM | POA: Diagnosis not present

## 2020-10-26 DIAGNOSIS — L89320 Pressure ulcer of left buttock, unstageable: Secondary | ICD-10-CM | POA: Diagnosis not present

## 2020-10-26 DIAGNOSIS — L89150 Pressure ulcer of sacral region, unstageable: Secondary | ICD-10-CM | POA: Diagnosis not present

## 2020-10-26 DIAGNOSIS — E785 Hyperlipidemia, unspecified: Secondary | ICD-10-CM | POA: Diagnosis not present

## 2020-10-26 DIAGNOSIS — F0390 Unspecified dementia without behavioral disturbance: Secondary | ICD-10-CM | POA: Diagnosis not present

## 2020-10-27 ENCOUNTER — Encounter (HOSPITAL_BASED_OUTPATIENT_CLINIC_OR_DEPARTMENT_OTHER): Payer: Medicare Other | Admitting: Internal Medicine

## 2020-10-27 ENCOUNTER — Other Ambulatory Visit: Payer: Self-pay

## 2020-10-27 DIAGNOSIS — L89320 Pressure ulcer of left buttock, unstageable: Secondary | ICD-10-CM | POA: Diagnosis not present

## 2020-10-27 DIAGNOSIS — E11622 Type 2 diabetes mellitus with other skin ulcer: Secondary | ICD-10-CM | POA: Diagnosis not present

## 2020-10-27 DIAGNOSIS — E1151 Type 2 diabetes mellitus with diabetic peripheral angiopathy without gangrene: Secondary | ICD-10-CM | POA: Diagnosis not present

## 2020-10-27 NOTE — Progress Notes (Signed)
[name] ([id number]) , Visit Report for 10/27/2020 Arrival Information Details Patient Name: Date of Service: [name] 10/27/2020 11:00 [facility] Record Number: [id number] Patient Account Number: [id number] Date of Birth/Sex: Treating RN: 07-07-1935 (85 y.o. [name] Primary Care [name]: [name] Other Clinician: Referring [name]: Treating [name]: Deloria Lair in Treatment: 3 Visit Information History Since Last Visit Added or deleted any medications: No Patient Arrived: Wheel Chair Any new allergies or adverse reactions: No Arrival Time: 11:23 Had a fall or experienced change in No Accompanied By: Daughter activities of daily living that may affect Transfer Assistance: Manual risk of falls: Patient Identification Verified: Yes Signs or symptoms of abuse/neglect since last visito No Secondary Verification Process Completed: Yes Hospitalized since last visit: No Patient Requires Transmission-Based Precautions: No Implantable device outside of the clinic excluding No Patient Has Alerts: No cellular tissue based products placed in the center since last visit: Has Dressing in Place as Prescribed: Yes Pain Present Now: No Electronic Signature(s) Signed: 10/27/2020 4:28:34 PM By: Lorrin Jackson Entered By: Lorrin Jackson on 10/27/2020 11:24:04 -------------------------------------------------------------------------------- Clinic Level of Care Assessment Details Patient Name: Date of Service: Carolyn, Contreras 10/27/2020 11:00 [name] Record Number: [id number] Patient Account Number: [id number] Date of Birth/Sex: Treating RN: 1935/11/20 (85 y.o. [name], Lauren Primary Care Bernis Schreur: [name] Other Clinician: Referring Marilu Rylander: Treating Ondine Gemme/Extender: Deloria Lair in Treatment: 3 Clinic Level of Care Assessment Items TOOL 4 Quantity Score X- 1 0 Use when only an  EandM is performed on FOLLOW-UP visit ASSESSMENTS - Nursing Assessment / Reassessment []  - 0 Reassessment of Co-morbidities (includes updates in patient status) []  - 0 Reassessment of Adherence to Treatment Plan ASSESSMENTS - Wound and Skin A ssessment / Reassessment X - Simple Wound Assessment / Reassessment - one wound 1 5 []  - 0 Complex Wound Assessment / Reassessment - multiple wounds X- 1 10 Dermatologic / Skin Assessment (not related to wound area) ASSESSMENTS - Focused Assessment []  - 0 Circumferential Edema Measurements - multi extremities []  - 0 Nutritional Assessment / Counseling / Intervention []  - 0 Lower Extremity Assessment (monofilament, tuning fork, pulses) []  - 0 Peripheral Arterial Disease Assessment (using hand held doppler) ASSESSMENTS - Ostomy and/or Continence Assessment and Care []  - 0 Incontinence Assessment and Management []  - 0 Ostomy Care Assessment and Management (repouching, etc.) PROCESS - Coordination of Care X - Simple Patient / Family Education for ongoing care 1 15 []  - 0 Complex (extensive) Patient / Family Education for ongoing care X- 1 10 Staff obtains Programmer, systems, Records, T Results / Process Orders est X- 1 10 Staff telephones HHA, Nursing Homes / Clarify orders / etc []  - 0 Routine Transfer to another Facility (non-emergent condition) []  - 0 Routine Hospital Admission (non-emergent condition) []  - 0 New Admissions / Biomedical engineer / Ordering NPWT Apligraf, etc. , []  - 0 Emergency Hospital Admission (emergent condition) X- 1 10 Simple Discharge Coordination []  - 0 Complex (extensive) Discharge Coordination PROCESS - Special Needs []  - 0 Pediatric / Minor Patient Management []  - 0 Isolation Patient Management []  - 0 Hearing / Language / Visual special needs []  - 0 Assessment of Community assistance (transportation, D/C planning, etc.) []  - 0 Additional assistance / Altered mentation []  - 0 Support Surface(s)  Assessment (bed, cushion, seat, etc.) INTERVENTIONS - Wound Cleansing / Measurement X - Simple Wound Cleansing - one wound 1 5 []  - 0 Complex Wound Cleansing - multiple wounds X-  1 5 Wound Imaging (photographs - any number of wounds) []  - 0 Wound Tracing (instead of photographs) X- 1 5 Simple Wound Measurement - one wound []  - 0 Complex Wound Measurement - multiple wounds INTERVENTIONS - Wound Dressings X - Small Wound Dressing one or multiple wounds 1 10 []  - 0 Medium Wound Dressing one or multiple wounds []  - 0 Large Wound Dressing one or multiple wounds []  - 0 Application of Medications - topical []  - 0 Application of Medications - injection INTERVENTIONS - Miscellaneous []  - 0 External ear exam []  - 0 Specimen Collection (cultures, biopsies, blood, body fluids, etc.) []  - 0 Specimen(s) / Culture(s) sent or taken to Lab for analysis []  - 0 Patient Transfer (multiple staff / Civil Service fast streamer / Similar devices) []  - 0 Simple Staple / Suture removal (25 or less) []  - 0 Complex Staple / Suture removal (26 or more) []  - 0 Hypo / Hyperglycemic Management (close monitor of Blood Glucose) []  - 0 Ankle / Brachial Index (ABI) - do not check if billed separately X- 1 5 Vital Signs Has the patient been seen at the hospital within the last three years: Yes Total Score: 90 Level Of Care: New/Established - Level 3 Electronic Signature(s) Signed: 10/27/2020 5:26:08 PM By: Rhae Hammock RN Entered By: Rhae Hammock on 10/27/2020 12:38:47 -------------------------------------------------------------------------------- Lower Extremity Assessment Details Patient Name: Date of Service: Carolyn, Contreras 10/27/2020 11:00 A M Medical Record Number: [id number] Patient Account Number: [id number] Date of Birth/Sex: Treating RN: 10-22-1935 (85 y.o. [name] Primary Care Lesle Faron: [name] Other Clinician: Referring Jameyah Fennewald: Treating Tuyen Uncapher/Extender: Deloria Lair in Treatment: 3 Electronic Signature(s) Signed: 10/27/2020 4:28:34 PM By: Lorrin Jackson Entered By: Lorrin Jackson on 10/27/2020 11:25:38 -------------------------------------------------------------------------------- Multi Wound Chart Details Patient Name: Date of Service: [name] 10/27/2020 11:00 A M Medical Record Number: [id number] Patient Account Number: [id number] Date of Birth/Sex: Treating RN: 06-Feb-1936 (85 y.o. [name], Lauren Primary Care Prabhav Faulkenberry: [name] Other Clinician: Referring Ave Scharnhorst: Treating Harout Scheurich/Extender: Deloria Lair in Treatment: 3 Vital Signs Height(in): 68 Capillary Blood Glucose(mg/dl): 172 Weight(lbs): 110 Pulse(bpm): 78 Body Mass Index(BMI): 17 Blood Pressure(mmHg): 156/95 Temperature(F): 97.6 Respiratory Rate(breaths/min): 16 Photos: [2:No Photos Left Gluteus] [N/A:N/A N/A] Wound Location: [2:Pressure Injury] [N/A:N/A] Wounding Event: [2:Pressure Ulcer] [N/A:N/A] Primary Etiology: [2:Anemia, Deep Vein Thrombosis,] [N/A:N/A] Comorbid History: [2:Hypertension, Type II Diabetes, Dementia 09/18/2020] [N/A:N/A] Date Acquired: [2:3] [N/A:N/A] Weeks of Treatment: [2:Open] [N/A:N/A] Wound Status: [2:7.5x5.1x2.2] [N/A:N/A] Measurements L x W x D (cm) [2:30.041] [N/A:N/A] A (cm) : rea [2:66.091] [N/A:N/A] Volume (cm) : [2:27.90%] [N/A:N/A] % Reduction in A rea: [2:-1486.40%] [N/A:N/A] % Reduction in Volume: [2:2] Starting Position 1 (o'clock): [2:4] Ending Position 1 (o'clock): [2:2.3] Maximum Distance 1 (cm): [2:Yes] [N/A:N/A] Undermining: [2:Unstageable/Unclassified] [N/A:N/A] Classification: [2:Medium] [N/A:N/A] Exudate A mount: [2:Serosanguineous] [N/A:N/A] Exudate Type: [2:red, brown] [N/A:N/A] Exudate Color: [2:Well defined, not attached] [N/A:N/A] Wound Margin: [2:Medium (34-66%)] [N/A:N/A] Granulation A mount: [2:Medium (34-66%)] [N/A:N/A] Necrotic A mount:  [2:Eschar, Adherent Slough] [N/A:N/A] Necrotic Tissue: [2:Fat Layer (Subcutaneous Tissue): Yes N/A] Exposed Structures: [2:Fascia: No Tendon: No Muscle: No Joint: No Bone: No None] [N/A:N/A] Treatment Notes Electronic Signature(s) Signed: 10/27/2020 4:29:36 PM By: Linton Ham MD Signed: 10/27/2020 5:26:08 PM By: Rhae Hammock RN Entered By: Linton Ham on 10/27/2020 12:31:09 -------------------------------------------------------------------------------- Multi-Disciplinary Care Plan Details Patient Name: Date of Service: [name] 10/27/2020 11:00 A M Medical Record Number: [id number] Patient Account Number: [id number] Date of Birth/Sex: Treating RN: 1935/06/02 (85 y.o. [name], Lauren  Primary Care Andrey Hoobler: [name] Other Clinician: Referring Catrinia Racicot: Treating Patt Steinhardt/Extender: Deloria Lair in Treatment: 3 Active Inactive Pressure Nursing Diagnoses: Knowledge deficit related to management of pressures ulcers Goals: Patient/caregiver will verbalize understanding of pressure ulcer management Date Initiated: 10/02/2020 Target Resolution Date: 10/30/2020 Goal Status: Active Interventions: Assess: immobility, friction, shearing, incontinence upon admission and as needed Assess offloading mechanisms upon admission and as needed Assess potential for pressure ulcer upon admission and as needed Provide education on pressure ulcers Notes: Wound/Skin Impairment Nursing Diagnoses: Impaired tissue integrity Goals: Patient/caregiver will verbalize understanding of skin care regimen Date Initiated: 10/02/2020 Target Resolution Date: 10/30/2020 Goal Status: Active Ulcer/skin breakdown will have a volume reduction of 30% by week 4 Date Initiated: 10/02/2020 Target Resolution Date: 10/30/2020 Goal Status: Active Interventions: Assess patient/caregiver ability to obtain necessary supplies Assess patient/caregiver ability to perform  ulcer/skin care regimen upon admission and as needed Assess ulceration(s) every visit Provide education on ulcer and skin care Treatment Activities: Skin care regimen initiated : 10/02/2020 Topical wound management initiated : 10/02/2020 Notes: Electronic Signature(s) Signed: 10/27/2020 5:26:08 PM By: Rhae Hammock RN Entered By: Rhae Hammock on 10/27/2020 11:12:33 -------------------------------------------------------------------------------- Pain Assessment Details Patient Name: Date of Service: Carolyn, Contreras 10/27/2020 11:00 A M Medical Record Number: [id number] Patient Account Number: [id number] Date of Birth/Sex: Treating RN: 08/21/1935 (85 y.o. [name] Primary Care Beverely Suen: [name] Other Clinician: Referring Latonda Larrivee: Treating Othon Guardia/Extender: Deloria Lair in Treatment: 3 Active Problems Location of Pain Severity and Description of Pain Patient Has Paino No Site Locations Pain Management and Medication Current Pain Management: Electronic Signature(s) Signed: 10/27/2020 4:28:34 PM By: Lorrin Jackson Entered By: Lorrin Jackson on 10/27/2020 11:25:28 -------------------------------------------------------------------------------- Patient/Caregiver Education Details Patient Name: Date of Service: [name] 6/21/2022andnbsp11:00 [facility] Record Number: [id number] Patient Account Number: [id number] Date of Birth/Gender: Treating RN: [birth year] (85 y.o. [name] Primary Care Physician: [name] Other Clinician: Referring Physician: Treating Physician/Extender: Deloria Lair in Treatment: 3 Education Assessment Education Provided To: Patient Education Topics Provided Wound/Skin Impairment: Methods: Explain/Verbal Responses: State content correctly Electronic Signature(s) Signed: 10/27/2020 5:26:08 PM By: Rhae Hammock RN Entered By: Rhae Hammock on  10/27/2020 11:12:52 -------------------------------------------------------------------------------- Wound Assessment Details Patient Name: Date of Service: Carolyn, Contreras 10/27/2020 11:00 A M Medical Record Number: [id number] Patient Account Number: [id number] Date of Birth/Sex: Treating RN: Sep 11, 1935 (85 y.o. [name] Primary Care Vallery Mcdade: [name] Other Clinician: Referring Pinky Ravan: Treating Sigourney Portillo/Extender: Deloria Lair in Treatment: 3 Wound Status Wound Number: 2 Primary Pressure Ulcer Etiology: Wound Location: Left Gluteus Wound Status: Open Wounding Event: Pressure Injury Comorbid Anemia, Deep Vein Thrombosis, Hypertension, Type II Date Acquired: 09/18/2020 History: Diabetes, Dementia Weeks Of Treatment: 3 Clustered Wound: No Photos Wound Measurements Length: (cm) 7.5 Width: (cm) 5.1 Depth: (cm) 2.2 Area: (cm) 30.041 Volume: (cm) 66.091 Wound Description Classification: Unstageable/Unclassified Wound Margin: Well defined, not attached Exudate Amount: Medium Exudate Type: Serosanguineous Exudate Color: red, brown Foul Odor After Cleansing: N Slough/Fibrino Y % Reduction in Area: 27.9% % Reduction in Volume: -1486.4% Epithelialization: None Tunneling: No Undermining: Yes Starting Position (o'clock): 2 Ending Position (o'clock): 4 Maximum Distance: (cm) 2.3 o es Wound Bed Granulation Amount: Medium (34-66%) Exposed Structure Necrotic Amount: Medium (34-66%) Fascia Exposed: No Necrotic Quality: Eschar, Adherent Slough Fat Layer (Subcutaneous Tissue) Exposed: Yes Tendon Exposed: No Muscle Exposed: No Joint Exposed: No Bone Exposed: No Electronic Signature(s) Signed: 10/27/2020 4:19:49 PM By: Sandre Kitty Signed: 10/27/2020 4:28:34  PM By: Fara Chute By: Sandre Kitty on 10/27/2020 15:35:36 -------------------------------------------------------------------------------- Vitals  Details Patient Name: Date of Service: Carolyn, Contreras 10/27/2020 11:00 A M Medical Record Number: [id number] Patient Account Number: [id number] Date of Birth/Sex: Treating RN: 24-Apr-1936 (85 y.o. [name] Primary Care Juleah Paradise: [name] Other Clinician: Referring Carlie Corpus: Treating Ival Pacer/Extender: Deloria Lair in Treatment: 3 Vital Signs Time Taken: 11:24 Temperature (F): 97.6 Height (in): 68 Pulse (bpm): 82 Weight (lbs): 110 Respiratory Rate (breaths/min): 16 Body Mass Index (BMI): 16.7 Blood Pressure (mmHg): 156/95 Capillary Blood Glucose (mg/dl): 172 Reference Range: 80 - 120 mg / dl Electronic Signature(s) Signed: 10/27/2020 4:28:34 PM By: Lorrin Jackson Entered By: Lorrin Jackson on 10/27/2020 11:25:20

## 2020-10-27 NOTE — Progress Notes (Signed)
[name] ([id number]) , Visit Report for 10/27/2020 HPI Details Patient Name: Date of Service: [name], [name] 10/27/2020 11:00 [place] Record Number: [id number] Patient Account Number: [id number] Date of Birth/Sex: Treating RN: [birth year] (85 y.o. [name] Primary Care Provider: [name] Other Clinician: Referring Provider: Treating Provider/Extender: Deloria Lair in Treatment: 3 History of Present Illness HPI Description: Admission 5/27 [name] is an 85 year old female with a past medical history of type 2 diabetes on oral agents, hypertension and dementia that presents today to the clinic for a wound to her right buttocks. Her daughter-in-law is present today and helps provide the history. This issue has been going on for 2 to 3 weeks now. She states she developed an eschar about 1 week ago. She has been taking doxycycline for possible soft tissue infection to the area prescribed by her primary care provider. Patient currently denies any signs of infection. 6/3; patient admitted to the clinic last week. She has a large necrotic area on her left buttock. They use Santyl last week. On arrival this visit she had completely necrotic surface with open to subcutaneous tissue. The surface was completely nonviable. They have been using Santyl. She apparently had been on doxycycline which she is just finishing prescribed by her primary doctor for possible soft tissue infection 6/7; patient with a large necrotic wound over her left buttock. Aggressive debridement last week. Culture of this grew Proteus unfortunately would not of been covered well or at least predictably by the doxycycline that her primary doctor put her on. I have therefore put her on Augmentin suspension 3 times a day. We are using silver alginate as the primary dressing while we deal with the infection. Her daughter has a list of concerns including swallowing  difficulties, concerns about aspiration. The patient has advanced dementia. If this is Alzheimer's disease it is at its preterminal stages. I went over that swallowing difficulties are part of what happens in the preterminal stages of Alzheimer's disease. Nevertheless we will family members seems to want to pursue a very aggressive course 6/21 large wound over her left buttock. She has completed the antibiotics I gave her [Augmentin]. We are using silver alginate while we are dealing with the infection and also to help with the drainage In general the wound looks better she has healthy granulation over perhaps 70% of it. Superiorly there is still necrotic debris I did not attempt to debride this today The patient has advanced dementia. I do not know that she could tolerate a wound VAC. She also has double incontinence which would make that difficult. Nevertheless she has been cared for diligently by her family. She is apparently eating well and may be 2 to 3 hours on this area all day Electronic Signature(s) Signed: 10/27/2020 4:29:36 PM By: Linton Ham MD Entered By: Linton Ham on 10/27/2020 12:33:26 -------------------------------------------------------------------------------- Physical Exam Details Patient Name: Date of Service: [name] 10/27/2020 11:00 A M Medical Record Number: [id number] Patient Account Number: [id number] Date of Birth/Sex: Treating RN: 1935/10/20 (85 y.o. [name] Primary Care Provider: [name] Other Clinician: Referring Provider: Treating Provider/Extender: Deloria Lair in Treatment: 3 Constitutional Sitting or standing Blood Pressure is within target range for patient.. Pulse regular and within target range for patient.Marland Kitchen Respirations regular, non-labored and within target range.. Temperature is normal and within the target range for the patient.Marland Kitchen Appears in no distress. Psychiatric Severe  dementia. Notes Wound exam; left buttock. About 70%  of this has healthy granulation there is still a patch of adherent debris which is probably going to need further debridement at some point. The edges look better the granulation tissue looks better. There is no exposed bone no evidence of surrounding soft tissue infection Electronic Signature(s) Signed: 10/27/2020 4:29:36 PM By: Linton Ham MD Entered By: Linton Ham on 10/27/2020 12:38:29 -------------------------------------------------------------------------------- Physician Orders Details Patient Name: Date of Service: [name] 10/27/2020 11:00 [place] Record Number: [id number] Patient Account Number: [id number] Date of Birth/Sex: Treating RN: [birth year] (85 y.o. [name] Primary Care Provider: [name] Other Clinician: Referring Provider: Treating Provider/Extender: Deloria Lair in Treatment: 3 Verbal / Phone Orders: No Diagnosis Coding Follow-up Appointments ppointment in 2 weeks. - Tuesday with Dr. Dellia Nims Return A Off-Loading Low air-loss mattress (Group 2) - Old one not working, new one ordered Turn and reposition every 2 hours Additional Orders / Instructions Follow Nutritious Diet - 100-120g of Protein Home Health No change in wound care orders this week; continue Home Health for wound care. May utilize formulary equivalent dressing for wound treatment orders unless otherwise specified. [facility] Monday's, Wednseday's, and Friday's Wound Treatment Wound #2 - Gluteus Wound Laterality: Left Cleanser: Soap and Water (Home Health) 1 x Per Day/30 Days Discharge Instructions: May shower and wash wound with dial antibacterial soap and water prior to dressing change. Prim Dressing: KerraCel Ag Gelling Fiber Dressing, 4x5 in (silver alginate) (Home Health) 1 x Per Day/30 Days ary Discharge Instructions: pack into wound bed Secondary Dressing: Woven Gauze  Sponge, Non-Sterile 4x4 in (Home Health) 1 x Per Day/30 Days Discharge Instructions: Lightly pack in wound over silver alginate Secondary Dressing: ABD Pad, 8x10 (Home Health) 1 x Per Day/30 Days Discharge Instructions: Apply over primary dressing as directed. Secured With: 32M Medipore H Soft Cloth Surgical T 4 x 2 (in/yd) (Home Health) 1 x Per Day/30 Days ape Discharge Instructions: Secure dressing with tape as directed. Electronic Signature(s) Signed: 10/27/2020 4:29:36 PM By: Linton Ham MD Signed: 10/27/2020 5:26:08 PM By: Rhae Hammock RN Entered By: Rhae Hammock on 10/27/2020 12:20:08 -------------------------------------------------------------------------------- Problem List Details Patient Name: Date of Service: [name], [name] 10/27/2020 11:00 A M Medical Record Number: [id number] Patient Account Number: [id number] Date of Birth/Sex: Treating RN: 1935-08-27 (85 y.o. [name], [name] Primary Care Provider: [name] Other Clinician: Referring Provider: Treating Provider/Extender: Deloria Lair in Treatment: 3 Active Problems ICD-10 Encounter Code Description Active Date MDM Diagnosis L89.320 Pressure ulcer of left buttock, unstageable 10/13/2020 No Yes E11.9 Type 2 diabetes mellitus without complications 06/11/7626 No Yes Inactive Problems Resolved Problems Electronic Signature(s) Signed: 10/27/2020 4:29:36 PM By: Linton Ham MD Entered By: Linton Ham on 10/27/2020 12:28:52 -------------------------------------------------------------------------------- Progress Note Details Patient Name: Date of Service: [name] 10/27/2020 11:00 A M Medical Record Number: [id number] Patient Account Number: [id number] Date of Birth/Sex: Treating RN: 08-13-1935 (85 y.o. [name] Primary Care Provider: [name] Other Clinician: Referring Provider: Treating Provider/Extender: Deloria Lair in Treatment: 3 Subjective History of Present Illness (HPI) Admission 5/27 [name] is an 85 year old female with a past medical history of type 2 diabetes on oral agents, hypertension and dementia that presents today to the clinic for a wound to her right buttocks. Her daughter-in-law is present today and helps provide the history. This issue has been going on for 2 to 3 weeks now. She states she developed an eschar about 1 week ago. She has  been taking doxycycline for possible soft tissue infection to the area prescribed by her primary care provider. Patient currently denies any signs of infection. 6/3; patient admitted to the clinic last week. She has a large necrotic area on her left buttock. They use Santyl last week. On arrival this visit she had completely necrotic surface with open to subcutaneous tissue. The surface was completely nonviable. They have been using Santyl. She apparently had been on doxycycline which she is just finishing prescribed by her primary doctor for possible soft tissue infection 6/7; patient with a large necrotic wound over her left buttock. Aggressive debridement last week. Culture of this grew Proteus unfortunately would not of been covered well or at least predictably by the doxycycline that her primary doctor put her on. I have therefore put her on Augmentin suspension 3 times a day. We are using silver alginate as the primary dressing while we deal with the infection. Her daughter has a list of concerns including swallowing difficulties, concerns about aspiration. The patient has advanced dementia. If this is Alzheimer's disease it is at its preterminal stages. I went over that swallowing difficulties are part of what happens in the preterminal stages of Alzheimer's disease. Nevertheless we will family members seems to want to pursue a very aggressive course 6/21 large wound over her left buttock. She has completed the antibiotics I gave  her [Augmentin]. We are using silver alginate while we are dealing with the infection and also to help with the drainage In general the wound looks better she has healthy granulation over perhaps 70% of it. Superiorly there is still necrotic debris I did not attempt to debride this today The patient has advanced dementia. I do not know that she could tolerate a wound VAC. She also has double incontinence which would make that difficult. Nevertheless she has been cared for diligently by her family. She is apparently eating well and may be 2 to 3 hours on this area all day Objective Constitutional Sitting or standing Blood Pressure is within target range for patient.. Pulse regular and within target range for patient.Marland Kitchen Respirations regular, non-labored and within target range.. Temperature is normal and within the target range for the patient.Marland Kitchen Appears in no distress. Vitals Time Taken: 11:24 AM, Height: 68 in, Weight: 110 lbs, BMI: 16.7, Temperature: 97.6 F, Pulse: 82 bpm, Respiratory Rate: 16 breaths/min, Blood Pressure: 156/95 mmHg, Capillary Blood Glucose: 172 mg/dl. Psychiatric Severe dementia. General Notes: Wound exam; left buttock. About 70% of this has healthy granulation there is still a patch of adherent debris which is probably going to need further debridement at some point. The edges look better the granulation tissue looks better. There is no exposed bone no evidence of surrounding soft tissue infection Integumentary (Hair, Skin) Wound #2 status is Open. Original cause of wound was Pressure Injury. The date acquired was: 09/18/2020. The wound has been in treatment 3 weeks. The wound is located on the Left Gluteus. The wound measures 7.5cm length x 5.1cm width x 2.2cm depth; 30.041cm^2 area and 66.091cm^3 volume. There is Fat Layer (Subcutaneous Tissue) exposed. There is no tunneling noted, however, there is undermining starting at 2:00 and ending at 4:00 with a maximum distance of  2.3cm. There is a medium amount of serosanguineous drainage noted. The wound margin is well defined and not attached to the wound base. There is medium (34-66%) granulation within the wound bed. There is a medium (34-66%) amount of necrotic tissue within the wound bed including Eschar and Adherent  Slough. Assessment Active Problems ICD-10 Pressure ulcer of left buttock, unstageable Type 2 diabetes mellitus without complications Plan Follow-up Appointments: Return Appointment in 2 weeks. - Tuesday with Dr. Dellia Nims Off-Loading: Low air-loss mattress (Group 2) - Old one not working, new one ordered Turn and reposition every 2 hours Additional Orders / Instructions: Follow Nutritious Diet - 100-120g of Protein Home Health: No change in wound care orders this week; continue Home Health for wound care. May utilize formulary equivalent dressing for wound treatment orders unless otherwise specified. [facility] Monday's, Wednseday's, and Friday's WOUND #2: - Gluteus Wound Laterality: Left Cleanser: Soap and Water (Home Health) 1 x Per Day/30 Days Discharge Instructions: May shower and wash wound with dial antibacterial soap and water prior to dressing change. Prim Dressing: KerraCel Ag Gelling Fiber Dressing, 4x5 in (silver alginate) (Home Health) 1 x Per Day/30 Days ary Discharge Instructions: pack into wound bed Secondary Dressing: Woven Gauze Sponge, Non-Sterile 4x4 in (Home Health) 1 x Per Day/30 Days Discharge Instructions: Lightly pack in wound over silver alginate Secondary Dressing: ABD Pad, 8x10 (Home Health) 1 x Per Day/30 Days Discharge Instructions: Apply over primary dressing as directed. Secured With: 67M Medipore H Soft Cloth Surgical T 4 x 2 (in/yd) (Home Health) 1 x Per Day/30 Days ape Discharge Instructions: Secure dressing with tape as directed. 1. I continued with silver alginate for the next 2 weeks. At that point she is probably going to require further debridement of the  necrotic material in the remaining one third of the wound predominantly superiorly. 2. After that probably a collagen back wet-to-dry if her drainage will allow 3. I had some thoughts about a wound VAC however there are extenuating circumstances including her incontinence, the state of her cognition probably will make this confusing and uncomfortable. I am generally leaning away from this. 4. According to her daughter she is eating well. They are doing their best to keep the pressure off this area. 5. I see no need for additional antibiotics 6. No need for imaging studies at this point although that may become necessary if this deteriorates Electronic Signature(s) Signed: 10/27/2020 4:29:36 PM By: Linton Ham MD Entered By: Linton Ham on 10/27/2020 12:40:16 -------------------------------------------------------------------------------- SuperBill Details Patient Name: Date of Service: [name] 10/27/2020 Medical Record Number: [id number] Patient Account Number: [id number] Date of Birth/Sex: Treating RN: 23-Jan-1936 (85 y.o. [name], [name] Primary Care Provider: [name] Other Clinician: Referring Provider: Treating Provider/Extender: Deloria Lair in Treatment: 3 Diagnosis Coding ICD-10 Codes Code Description [phone] Pressure ulcer of left buttock, unstageable E11.9 Type 2 diabetes mellitus without complications Facility Procedures CPT4 Code: [id number] Description: 99213 - WOUND CARE VISIT-LEV 3 EST PT Modifier: Quantity: 1 Physician Procedures : CPT4 Code Description Modifier [id number] - WC PHYS LEVEL 3 - EST PT ICD-10 Diagnosis Description L89.320 Pressure ulcer of left buttock, unstageable E11.9 Type 2 diabetes mellitus without complications Quantity: 1 Electronic Signature(s) Signed: 10/27/2020 4:29:36 PM By: Linton Ham MD Entered By: Linton Ham on 10/27/2020 12:40:38

## 2020-10-29 DIAGNOSIS — I890 Lymphedema, not elsewhere classified: Secondary | ICD-10-CM | POA: Diagnosis not present

## 2020-10-29 DIAGNOSIS — Z8673 Personal history of transient ischemic attack (TIA), and cerebral infarction without residual deficits: Secondary | ICD-10-CM | POA: Diagnosis not present

## 2020-10-29 DIAGNOSIS — F0390 Unspecified dementia without behavioral disturbance: Secondary | ICD-10-CM | POA: Diagnosis not present

## 2020-10-29 DIAGNOSIS — I10 Essential (primary) hypertension: Secondary | ICD-10-CM | POA: Diagnosis not present

## 2020-10-29 DIAGNOSIS — L03312 Cellulitis of back [any part except buttock]: Secondary | ICD-10-CM | POA: Diagnosis not present

## 2020-10-29 DIAGNOSIS — L89320 Pressure ulcer of left buttock, unstageable: Secondary | ICD-10-CM | POA: Diagnosis not present

## 2020-10-29 DIAGNOSIS — Z86718 Personal history of other venous thrombosis and embolism: Secondary | ICD-10-CM | POA: Diagnosis not present

## 2020-10-29 DIAGNOSIS — L89150 Pressure ulcer of sacral region, unstageable: Secondary | ICD-10-CM | POA: Diagnosis not present

## 2020-10-29 DIAGNOSIS — E119 Type 2 diabetes mellitus without complications: Secondary | ICD-10-CM | POA: Diagnosis not present

## 2020-10-29 DIAGNOSIS — Z86711 Personal history of pulmonary embolism: Secondary | ICD-10-CM | POA: Diagnosis not present

## 2020-10-29 DIAGNOSIS — E785 Hyperlipidemia, unspecified: Secondary | ICD-10-CM | POA: Diagnosis not present

## 2020-10-29 DIAGNOSIS — Z7984 Long term (current) use of oral hypoglycemic drugs: Secondary | ICD-10-CM | POA: Diagnosis not present

## 2020-10-30 DIAGNOSIS — F0390 Unspecified dementia without behavioral disturbance: Secondary | ICD-10-CM | POA: Diagnosis not present

## 2020-10-30 DIAGNOSIS — E785 Hyperlipidemia, unspecified: Secondary | ICD-10-CM | POA: Diagnosis not present

## 2020-10-30 DIAGNOSIS — L89150 Pressure ulcer of sacral region, unstageable: Secondary | ICD-10-CM | POA: Diagnosis not present

## 2020-10-30 DIAGNOSIS — I890 Lymphedema, not elsewhere classified: Secondary | ICD-10-CM | POA: Diagnosis not present

## 2020-10-30 DIAGNOSIS — L03312 Cellulitis of back [any part except buttock]: Secondary | ICD-10-CM | POA: Diagnosis not present

## 2020-10-30 DIAGNOSIS — L89320 Pressure ulcer of left buttock, unstageable: Secondary | ICD-10-CM | POA: Diagnosis not present

## 2020-11-02 DIAGNOSIS — L89150 Pressure ulcer of sacral region, unstageable: Secondary | ICD-10-CM | POA: Diagnosis not present

## 2020-11-02 DIAGNOSIS — E785 Hyperlipidemia, unspecified: Secondary | ICD-10-CM | POA: Diagnosis not present

## 2020-11-02 DIAGNOSIS — L03312 Cellulitis of back [any part except buttock]: Secondary | ICD-10-CM | POA: Diagnosis not present

## 2020-11-02 DIAGNOSIS — I890 Lymphedema, not elsewhere classified: Secondary | ICD-10-CM | POA: Diagnosis not present

## 2020-11-02 DIAGNOSIS — L89320 Pressure ulcer of left buttock, unstageable: Secondary | ICD-10-CM | POA: Diagnosis not present

## 2020-11-02 DIAGNOSIS — F0390 Unspecified dementia without behavioral disturbance: Secondary | ICD-10-CM | POA: Diagnosis not present

## 2020-11-04 DIAGNOSIS — L03312 Cellulitis of back [any part except buttock]: Secondary | ICD-10-CM | POA: Diagnosis not present

## 2020-11-04 DIAGNOSIS — I890 Lymphedema, not elsewhere classified: Secondary | ICD-10-CM | POA: Diagnosis not present

## 2020-11-04 DIAGNOSIS — F0390 Unspecified dementia without behavioral disturbance: Secondary | ICD-10-CM | POA: Diagnosis not present

## 2020-11-04 DIAGNOSIS — L89320 Pressure ulcer of left buttock, unstageable: Secondary | ICD-10-CM | POA: Diagnosis not present

## 2020-11-04 DIAGNOSIS — E785 Hyperlipidemia, unspecified: Secondary | ICD-10-CM | POA: Diagnosis not present

## 2020-11-04 DIAGNOSIS — L89150 Pressure ulcer of sacral region, unstageable: Secondary | ICD-10-CM | POA: Diagnosis not present

## 2020-11-05 DIAGNOSIS — L89320 Pressure ulcer of left buttock, unstageable: Secondary | ICD-10-CM | POA: Diagnosis not present

## 2020-11-05 DIAGNOSIS — I890 Lymphedema, not elsewhere classified: Secondary | ICD-10-CM | POA: Diagnosis not present

## 2020-11-05 DIAGNOSIS — L03312 Cellulitis of back [any part except buttock]: Secondary | ICD-10-CM | POA: Diagnosis not present

## 2020-11-05 DIAGNOSIS — F0390 Unspecified dementia without behavioral disturbance: Secondary | ICD-10-CM | POA: Diagnosis not present

## 2020-11-05 DIAGNOSIS — E785 Hyperlipidemia, unspecified: Secondary | ICD-10-CM | POA: Diagnosis not present

## 2020-11-05 DIAGNOSIS — L89150 Pressure ulcer of sacral region, unstageable: Secondary | ICD-10-CM | POA: Diagnosis not present

## 2020-11-06 DIAGNOSIS — L89150 Pressure ulcer of sacral region, unstageable: Secondary | ICD-10-CM | POA: Diagnosis not present

## 2020-11-06 DIAGNOSIS — L89320 Pressure ulcer of left buttock, unstageable: Secondary | ICD-10-CM | POA: Diagnosis not present

## 2020-11-06 DIAGNOSIS — E785 Hyperlipidemia, unspecified: Secondary | ICD-10-CM | POA: Diagnosis not present

## 2020-11-06 DIAGNOSIS — L03312 Cellulitis of back [any part except buttock]: Secondary | ICD-10-CM | POA: Diagnosis not present

## 2020-11-06 DIAGNOSIS — F0390 Unspecified dementia without behavioral disturbance: Secondary | ICD-10-CM | POA: Diagnosis not present

## 2020-11-06 DIAGNOSIS — I890 Lymphedema, not elsewhere classified: Secondary | ICD-10-CM | POA: Diagnosis not present

## 2020-11-10 ENCOUNTER — Encounter: Payer: Medicare Other | Admitting: Nurse Practitioner

## 2020-11-10 ENCOUNTER — Other Ambulatory Visit: Payer: Self-pay

## 2020-11-10 ENCOUNTER — Telehealth: Payer: Self-pay

## 2020-11-10 ENCOUNTER — Ambulatory Visit (INDEPENDENT_AMBULATORY_CARE_PROVIDER_SITE_OTHER): Payer: Medicare Other | Admitting: Nurse Practitioner

## 2020-11-10 ENCOUNTER — Encounter (HOSPITAL_BASED_OUTPATIENT_CLINIC_OR_DEPARTMENT_OTHER): Payer: Medicare Other | Attending: Internal Medicine | Admitting: Internal Medicine

## 2020-11-10 DIAGNOSIS — L89324 Pressure ulcer of left buttock, stage 4: Secondary | ICD-10-CM | POA: Diagnosis not present

## 2020-11-10 DIAGNOSIS — Z0000 Encounter for general adult medical examination without abnormal findings: Secondary | ICD-10-CM

## 2020-11-10 DIAGNOSIS — L89320 Pressure ulcer of left buttock, unstageable: Secondary | ICD-10-CM | POA: Diagnosis not present

## 2020-11-10 DIAGNOSIS — E119 Type 2 diabetes mellitus without complications: Secondary | ICD-10-CM | POA: Diagnosis not present

## 2020-11-10 DIAGNOSIS — F0390 Unspecified dementia without behavioral disturbance: Secondary | ICD-10-CM | POA: Diagnosis not present

## 2020-11-10 DIAGNOSIS — L89329 Pressure ulcer of left buttock, unspecified stage: Secondary | ICD-10-CM | POA: Diagnosis present

## 2020-11-10 NOTE — Patient Instructions (Signed)
[name] , Thank you for taking time to come for your Medicare Wellness Visit. I appreciate your ongoing commitment to your health goals. Please review the following plan we discussed and let me know if I can assist you in the future.   Screening recommendations/referrals: Colonoscopy aged out Mammogram aged out Bone Density na Recommended yearly ophthalmology/optometry visit for glaucoma screening and checkup Recommended yearly dental visit for hygiene and checkup  Vaccinations: Influenza vaccine - recommended annually Pneumococcal vaccine recommended- to check with insurance to see if she has had her pneumonia vaccines in the past Tdap vaccine recommended to get at local pharmacy Shingles vaccine -recommended to get at local pharmacy.     Advanced directives: on file.   Conditions/risks identified: advanced age, nonhealing wounds, complications with diabetes.   Next appointment:  1 year    Preventive Care 57 Years and Older, Female Preventive care refers to lifestyle choices and visits with your health care provider that can promote health and wellness. What does preventive care include? A yearly physical exam. This is also called an annual well check. Dental exams once or twice a year. Routine eye exams. Ask your health care provider how often you should have your eyes checked. Personal lifestyle choices, including: Daily care of your teeth and gums. Regular physical activity. Eating a healthy diet. Avoiding tobacco and drug use. Limiting alcohol use. Practicing safe sex. Taking low-dose aspirin every day. Taking vitamin and mineral supplements as recommended by your health care provider. What happens during an annual well check? The services and screenings done by your health care provider during your annual well check will depend on your age, overall health, lifestyle risk factors, and family history of disease. Counseling  Your health care provider may ask you  questions about your: Alcohol use. Tobacco use. Drug use. Emotional well-being. Home and relationship well-being. Sexual activity. Eating habits. History of falls. Memory and ability to understand (cognition). Work and work Statistician. Reproductive health. Screening  You may have the following tests or measurements: Height, weight, and BMI. Blood pressure. Lipid and cholesterol levels. These may be checked every 5 years, or more frequently if you are over 31 years old. Skin check. Lung cancer screening. You may have this screening every year starting at age 36 if you have a 30-pack-year history of smoking and currently smoke or have quit within the past 15 years. Fecal occult blood test (FOBT) of the stool. You may have this test every year starting at age 56. Flexible sigmoidoscopy or colonoscopy. You may have a sigmoidoscopy every 5 years or a colonoscopy every 10 years starting at age 73. Hepatitis C blood test. Hepatitis B blood test. Sexually transmitted disease (STD) testing. Diabetes screening. This is done by checking your blood sugar (glucose) after you have not eaten for a while (fasting). You may have this done every 1-3 years. Bone density scan. This is done to screen for osteoporosis. You may have this done starting at age 2. Mammogram. This may be done every 1-2 years. Talk to your health care provider about how often you should have regular mammograms. Talk with your health care provider about your test results, treatment options, and if necessary, the need for more tests. Vaccines  Your health care provider may recommend certain vaccines, such as: Influenza vaccine. This is recommended every year. Tetanus, diphtheria, and acellular pertussis (Tdap, Td) vaccine. You may need a Td booster every 10 years. Zoster vaccine. You may need this after age 70. Pneumococcal 13-valent conjugate (  PCV13) vaccine. One dose is recommended after age 71. Pneumococcal polysaccharide  (PPSV23) vaccine. One dose is recommended after age 54. Talk to your health care provider about which screenings and vaccines you need and how often you need them. This information is not intended to replace advice given to you by your health care provider. Make sure you discuss any questions you have with your health care provider. Document Released: 05/22/2015 Document Revised: 01/13/2016 Document Reviewed: 02/24/2015 Elsevier Interactive Patient Education  2017 [place] Prevention in the Home Falls can cause injuries. They can happen to people of all ages. There are many things you can do to make your home safe and to help prevent falls. What can I do on the outside of my home? Regularly fix the edges of walkways and driveways and fix any cracks. Remove anything that might make you trip as you walk through a door, such as a raised step or threshold. Trim any bushes or trees on the path to your home. Use bright outdoor lighting. Clear any walking paths of anything that might make someone trip, such as rocks or tools. Regularly check to see if handrails are loose or broken. Make sure that both sides of any steps have handrails. Any raised decks and porches should have guardrails on the edges. Have any leaves, snow, or ice cleared regularly. Use sand or salt on walking paths during winter. Clean up any spills in your garage right away. This includes oil or grease spills. What can I do in the bathroom? Use night lights. Install grab bars by the toilet and in the tub and shower. Do not use towel bars as grab bars. Use non-skid mats or decals in the tub or shower. If you need to sit down in the shower, use a plastic, non-slip stool. Keep the floor dry. Clean up any water that spills on the floor as soon as it happens. Remove soap buildup in the tub or shower regularly. Attach bath mats securely with double-sided non-slip rug tape. Do not have throw rugs and other things on the  floor that can make you trip. What can I do in the bedroom? Use night lights. Make sure that you have a light by your bed that is easy to reach. Do not use any sheets or blankets that are too big for your bed. They should not hang down onto the floor. Have a firm chair that has side arms. You can use this for support while you get dressed. Do not have throw rugs and other things on the floor that can make you trip. What can I do in the kitchen? Clean up any spills right away. Avoid walking on wet floors. Keep items that you use a lot in easy-to-reach places. If you need to reach something above you, use a strong step stool that has a grab bar. Keep electrical cords out of the way. Do not use floor polish or wax that makes floors slippery. If you must use wax, use non-skid floor wax. Do not have throw rugs and other things on the floor that can make you trip. What can I do with my stairs? Do not leave any items on the stairs. Make sure that there are handrails on both sides of the stairs and use them. Fix handrails that are broken or loose. Make sure that handrails are as long as the stairways. Check any carpeting to make sure that it is firmly attached to the stairs. Fix any carpet that is  loose or worn. Avoid having throw rugs at the top or bottom of the stairs. If you do have throw rugs, attach them to the floor with carpet tape. Make sure that you have a light switch at the top of the stairs and the bottom of the stairs. If you do not have them, ask someone to add them for you. What else can I do to help prevent falls? Wear shoes that: Do not have high heels. Have rubber bottoms. Are comfortable and fit you well. Are closed at the toe. Do not wear sandals. If you use a stepladder: Make sure that it is fully opened. Do not climb a closed stepladder. Make sure that both sides of the stepladder are locked into place. Ask someone to hold it for you, if possible. Clearly mark and make  sure that you can see: Any grab bars or handrails. First and last steps. Where the edge of each step is. Use tools that help you move around (mobility aids) if they are needed. These include: Canes. Walkers. Scooters. Crutches. Turn on the lights when you go into a dark area. Replace any light bulbs as soon as they burn out. Set up your furniture so you have a clear path. Avoid moving your furniture around. If any of your floors are uneven, fix them. If there are any pets around you, be aware of where they are. Review your medicines with your doctor. Some medicines can make you feel dizzy. This can increase your chance of falling. Ask your doctor what other things that you can do to help prevent falls. This information is not intended to replace advice given to you by your health care provider. Make sure you discuss any questions you have with your health care provider. Document Released: 02/19/2009 Document Revised: 10/01/2015 Document Reviewed: 05/30/2014 Elsevier Interactive Patient Education  2017 [employer].

## 2020-11-10 NOTE — Progress Notes (Signed)
Subjective:   [name] is a 85 y.o. female who presents for Medicare Annual (Subsequent) preventive examination.  Review of Systems           Objective:    There were no vitals filed for this visit. There is no height or weight on file to calculate BMI.  Advanced Directives 11/10/2020 09/24/2020 02/26/2020 11/05/2019 08/23/2019 08/22/2019 08/05/2019  Does Patient Have a Medical Advance Directive? Yes Yes Yes Yes Yes Yes Yes  Type of Paramedic of [place];Out of facility DNR (pink MOST or yellow form) [place];Living will;Out of facility DNR (pink MOST or yellow form) [place];Out of facility DNR (pink MOST or yellow form) [place];Living will;Out of facility DNR (pink MOST or yellow form) Healthcare Power of [place]  Does patient want to make changes to medical advance directive? No - Patient declined No - Patient declined No - Patient declined No - Patient declined No - Patient declined No - Patient declined No - Guardian declined  Copy of [place] in Chart? Yes - validated most recent copy scanned in chart (See row information) Yes - validated most recent copy scanned in chart (See row information) Yes - validated most recent copy scanned in chart (See row information) Yes - validated most recent copy scanned in chart (See row information) Yes - validated most recent copy scanned in chart (See row information) Yes - validated most recent copy scanned in chart (See row information) Yes - validated most recent copy scanned in chart (See row information)  Pre-existing out of facility DNR order (yellow form or pink MOST form) Yellow form placed in chart (order not valid for inpatient use);Pink MOST form placed in chart (order not valid for inpatient use) - Yellow form placed in chart (order not valid for inpatient use);Pink MOST form  placed in chart (order not valid for inpatient use) - - - -    Current Medications (verified) Outpatient Encounter Medications as of 11/10/2020  Medication Sig   A&D OINT Apply 1 application topically as needed.    Accu-Chek Softclix Lancets lancets 1 each by Other route 2 (two) times daily. Use as instructed Dx: E11.9   acetaminophen (TYLENOL) 500 MG tablet Take 500 mg by mouth every 8 (eight) hours as needed (for general pain).    atorvastatin (LIPITOR) 40 MG tablet Take 1 tablet (40 mg total) by mouth daily at 6 PM.   Cholecalciferol (VITAMIN D-3) 125 MCG (5000 UT) TABS Take 5,000 Units by mouth daily.   gabapentin (NEURONTIN) 300 MG capsule Take 1 capsule (300 mg total) by mouth 2 (two) times daily.   glucose blood (ACCU-CHEK AVIVA PLUS) test strip USE STRIP(S) TO TEST TWICE A DAY - (KEEP UNUSED STRIPS IN ORIGINAL SEALED CONTAINER BETWEEN USES)   losartan (COZAAR) 50 MG tablet Take one tablet by mouth once daily, along with 25mg  to equal 75mg    metFORMIN (GLUCOPHAGE) 500 MG tablet Take 1 tablet (500 mg total) by mouth 2 (two) times daily with a meal.   NON FORMULARY Take 1 capsule by mouth See admin instructions. Met[employer]ics - UltraFlora Balance Daily Probiotic Immune Support* capsules- Take 1 capsule by mouth once a day   polyethylene glycol (MIRALAX / GLYCOLAX) packet Take 17 g by mouth daily as needed for mild constipation (MIX AND DRINK).    Rivaroxaban (XARELTO) 15 MG TABS tablet Take one tablet by mouth once daily  with supper   cloNIDine (CATAPRES) 0.1 MG tablet Take 1 tablet (0.1 mg total) by mouth 2 (two) times daily. May take an additional tablet if needed SBP >170 (Patient not taking: No sig reported)   losartan (COZAAR) 25 MG tablet Take one tablet by mouth once daily, take along with 50mg  to equal 75mg  (Patient not taking: No sig reported)   No facility-administered encounter medications on file as of 11/10/2020.    Allergies (verified) Patient has no known allergies.    History: Past Medical History:  Diagnosis Date   Dementia ([place])    Diabetes mellitus type II 03/21/2011   DVT (deep venous thrombosis) ([place]) 03/21/2011   Hyperlipidemia 03/21/2011   Hypertension 03/21/2011   Lymphedema of leg 03/21/2011   Pulmonary embolism ([place]) 03/21/2011   Stroke [facility])    Past Surgical History:  Procedure Laterality Date   CHOLECYSTECTOMY  2015   SKIN TAG REMOVAL  07/25/2016   VAGINAL HYSTERECTOMY     unknown of date   VASCULAR SURGERY     Family History  Problem Relation Age of Onset   Heart attack Mother    COPD Sister    Stroke Sister    Bipolar disorder Daughter    Social History   Socioeconomic History   Marital status: Widowed    Spouse name: Not on file   Number of children: Not on file   Years of education: Not on file   Highest education level: Not on file  Occupational History   Not on file  Tobacco Use   Smoking status: Never   Smokeless tobacco: Never  Vaping Use   Vaping Use: Never used  Substance and Sexual Activity   Alcohol use: No   Drug use: No   Sexual activity: Never  Other Topics Concern   Not on file  Social History Narrative   Diet?       Do you drink/eat things with caffeine? occasional diet soda      Marital status?   Widowed/divorced                                 What year were you married? [place] you live in a house, apartment, assisted living, condo, trailer, etc.? home      Is it one or more stories? More (mainly living on one floor)      How many persons live in your home?      Do you have any pets in your home? (please list) no      Current or past profession: teacher      Do you exercise?       Physical therapy                              Type & how often? 2-3 per week      Do you have a living will? yes      Do you have a DNR form?      ?                             If not, do you want to discuss one?      Do you have signed POA/HPOA for forms? Yes   11/29/16 Lives at home  wih family, son, daughter in Sports coach,  patrice and grand children.  Has 3 children.  She is widowed, retired.  Has BA- education level.     Social Determinants of Health   Financial Resource Strain: Not on file  Food Insecurity: Not on file  Transportation Needs: Not on file  Physical Activity: Not on file  Stress: Not on file  Social Connections: Not on file    Tobacco Counseling Counseling given: Not Answered   Clinical Intake:                 Diabetic?yes          Activities of Daily Living No flowsheet data found.  Patient Care Team: [name], NP as PCP - General (Geriatric Medicine) [name], MD as Consulting Physician (Oncology) [name], [name], LCSW as Social Worker (Licensed Clinical Social Worker) [name], RN as Registered Nurse [facility] and Palliative Medicine)  Indicate any recent [place] you may have received from other than [facility] providers in the past year (date may be approximate).     Assessment:   This is a routine wellness examination for [name].  Hearing/Vision screen Hearing Screening - Comments:: Patient has no hearing vision problems. Vision Screening - Comments:: Patient has no vision problems. Has not had eye exam within past year.  Dietary issues and exercise activities discussed:     Goals Addressed   None    Depression Screen PHQ 2/9 Scores 11/10/2020 11/05/2019 10/24/2018 10/24/2018 05/04/2018 10/18/2017 04/11/2017  PHQ - 2 Score 0 0 0 0 0 0 0    Fall Risk Fall Risk  11/10/2020 09/24/2020 07/06/2020 02/26/2020 11/05/2019  Falls in the past year? 0 0 0 0 1  Number falls in past yr: 0 0 0 0 1  Comment - - - - -  Injury with Fall? 0 0 0 0 0  Risk Factor Category  - - - - -  Risk for fall due to : No Fall Risks - - - -  Follow up Falls evaluation completed - - - -    FALL RISK PREVENTION PERTAINING TO THE HOME:  Any stairs in or around the home? No  If so, are there any without handrails? No  Home  free of loose throw rugs in walkways, pet beds, electrical cords, etc? Yes  Adequate lighting in your home to reduce risk of falls? Yes   ASSISTIVE DEVICES UTILIZED TO PREVENT FALLS:  Life alert? No  Use of a cane, walker or w/c? Yes  Grab bars in the bathroom? Yes  Shower chair or bench in shower? na Elevated toilet seat or a handicapped toilet? na  TIMED UP AND GO:  Was the test performed? No .   Cognitive Function: MMSE - Mini Mental Carolyn Exam 10/18/2017 07/26/2016  Not completed: Unable to complete Unable to complete     6CIT Screen 10/24/2018  What Year? (No Data)  What month? (No Data)  What time? (No Data)  Count back from 20 (No Data)  Months in reverse (No Data)  Repeat phrase (No Data)    Immunizations Immunization History  Administered Date(s) Administered   PFIZER Comirnaty(Gray Top)Covid-19 Tri-Sucrose Vaccine 06/30/2020   PFIZER(Purple Top)SARS-COV-2 Vaccination 11/07/2019, 12/04/2019    TDAP status: Due, Education has been provided regarding the importance of this vaccine. Advised may receive this vaccine at local pharmacy or Health Dept. Aware to provide a copy of the vaccination record if obtained from local pharmacy or Health Dept. Verbalized acceptance and understanding.  Flu Vaccine status:  Due, Education has been provided regarding the importance of this vaccine. Advised may receive this vaccine at local pharmacy or Health Dept. Aware to provide a copy of the vaccination record if obtained from local pharmacy or Health Dept. Verbalized acceptance and understanding.  Pneumococcal vaccine status: Due, Education has been provided regarding the importance of this vaccine. Advised may receive this vaccine at local pharmacy or Health Dept. Aware to provide a copy of the vaccination record if obtained from local pharmacy or Health Dept. Verbalized acceptance and understanding.  Covid-19 vaccine status: Completed vaccines  Qualifies for Shingles Vaccine? Yes    Zostavax completed No   Shingrix Completed?: No.    Education has been provided regarding the importance of this vaccine. Patient has been advised to call insurance company to determine out of pocket expense if they have not yet received this vaccine. Advised may also receive vaccine at local pharmacy or Health Dept. Verbalized acceptance and understanding.  Screening Tests Health Maintenance  Topic Date Due   TETANUS/TDAP  Never done   Zoster Vaccines- Shingrix (1 of 2) Never done   PNA vac Low Risk Adult (1 of 2 - PCV13) Never done   OPHTHALMOLOGY EXAM  01/24/2018   COVID-19 Vaccine (4 - Booster for Pfizer series) 10/28/2020   HEMOGLOBIN A1C  01/03/2021   FOOT EXAM  02/25/2021   DEXA SCAN  Completed   HPV VACCINES  Aged Out   INFLUENZA VACCINE  Discontinued    Health Maintenance  Health Maintenance Due  Topic Date Due   TETANUS/TDAP  Never done   Zoster Vaccines- Shingrix (1 of 2) Never done   PNA vac Low Risk Adult (1 of 2 - PCV13) Never done   OPHTHALMOLOGY EXAM  01/24/2018   COVID-19 Vaccine (4 - Booster for Pfizer series) 10/28/2020    Colorectal cancer screening: No longer required.   Mammogram status: No longer required due to aged.  Does not qualify for bone density   Lung Cancer Screening: (Low Dose CT Chest recommended if Age 62-80 years, 30 pack-year currently smoking OR have quit w/in 15years.) does not qualify.   Lung Cancer Screening Referral: na  Additional Screening:  Hepatitis C Screening: does not qualify; Completed na  Vision Screening: Recommended annual ophthalmology exams for early detection of glaucoma and other disorders of the eye. Is the patient up to date with their annual eye exam?  No  Who is the provider or what is the name of the office in which the patient attends annual eye exams? na If pt is not established with a provider, would they like to be referred to a provider to establish care? No .   Dental Screening: Recommended annual  dental exams for proper oral hygiene  Community Resource Referral / Chronic Care Management: CRR required this visit?  No   CCM required this visit?  No      Plan:     I have personally reviewed and noted the following in the patient's chart:   Medical and social history Use of alcohol, tobacco or illicit drugs  Current medications and supplements including opioid prescriptions.  Functional ability and status Nutritional status Physical activity Advanced directives List of other physicians Hospitalizations, surgeries, and ER visits in previous 12 months Vitals Screenings to include cognitive, depression, and falls Referrals and appointments  In addition, I have reviewed and discussed with patient certain preventive protocols, quality metrics, and best practice recommendations. A written personalized care plan for preventive services as well as general  preventive health recommendations were provided to patient.     [name], NP   11/10/2020    Virtual Visit via Telephone Note  I connected withNAME@ on 11/10/20 at  3:15 PM EDT by telephone and verified that I am speaking with the correct person using two identifiers.  Location: Patient: home  Provider: [facility]   I discussed the limitations, risks, security and privacy concerns of performing an evaluation and management service by telephone and the availability of in person appointments. I also discussed with the patient that there may be a patient responsible charge related to this service. The patient expressed understanding and agreed to proceed.   I discussed the assessment and treatment plan with the patient. The patient was provided an opportunity to ask questions and all were answered. The patient agreed with the plan and demonstrated an understanding of the instructions.   The patient was advised to call back or seek an in-person evaluation if the symptoms worsen or if the condition fails to improve as  anticipated.  I provided 15 minutes of non-face-to-face time during this encounter.  Carlos American. Harle Battiest Avs printed and mailed

## 2020-11-10 NOTE — Progress Notes (Signed)
This service is provided via telemedicine  No vital signs collected/recorded due to the encounter was a telemedicine visit.   Location of patient (ex: home, work):  Home  Patient consents to a telephone visit:  yes, see encounter dated 11/10/2020  Location of the provider (ex: office, home):  [place]  Name of any referring provider:  N/A  Names of all persons participating in the telemedicine service and their role in the encounter:  [name], Nurse Practitioner, Carroll Kinds, CMA, patient and patient's daughter, [name].  Time spent on call:  9 minutes with medical assistant

## 2020-11-10 NOTE — Telephone Encounter (Signed)
Ms. nysha, [place] are scheduled for a virtual visit with your provider today.    Just as we do with appointments in the office, we must obtain your consent to participate.  Your consent will be active for this visit and any virtual visit you may have with one of our providers in the next 365 days.    If you have a MyChart account, I can also send a copy of this consent to you electronically.  All virtual visits are billed to your insurance company just like a traditional visit in the office.  As this is a virtual visit, video technology does not allow for your provider to perform a traditional examination.  This may limit your provider's ability to fully assess your condition.  If your provider identifies any concerns that need to be evaluated in person or the need to arrange testing such as labs, EKG, etc, we will make arrangements to do so.    Although advances in technology are sophisticated, we cannot ensure that it will always work on either your end or our end.  If the connection with a video visit is poor, we may have to switch to a telephone visit.  With either a video or telephone visit, we are not always able to ensure that we have a secure connection.   I need to obtain your verbal consent now.   Are you willing to proceed with your visit today?   Emberlee Sortino, daughter Stormy Fabian provided verbal consent on 11/10/2020 for a virtual visit (video or telephone).   Carroll Kinds, CMA 11/10/2020  3:24 PM

## 2020-11-11 DIAGNOSIS — I890 Lymphedema, not elsewhere classified: Secondary | ICD-10-CM | POA: Diagnosis not present

## 2020-11-11 DIAGNOSIS — E785 Hyperlipidemia, unspecified: Secondary | ICD-10-CM | POA: Diagnosis not present

## 2020-11-11 DIAGNOSIS — L89320 Pressure ulcer of left buttock, unstageable: Secondary | ICD-10-CM | POA: Diagnosis not present

## 2020-11-11 DIAGNOSIS — L89150 Pressure ulcer of sacral region, unstageable: Secondary | ICD-10-CM | POA: Diagnosis not present

## 2020-11-11 DIAGNOSIS — L03312 Cellulitis of back [any part except buttock]: Secondary | ICD-10-CM | POA: Diagnosis not present

## 2020-11-11 DIAGNOSIS — F0390 Unspecified dementia without behavioral disturbance: Secondary | ICD-10-CM | POA: Diagnosis not present

## 2020-11-11 NOTE — Progress Notes (Addendum)
[name] ([id number]) , Visit Report for 11/10/2020 Arrival Information Details Patient Name: Date of Service: LADORA, OSTERBERG 11/10/2020 12:30 PM Medical Record Number: [id number] Patient Account Number: [id number] Date of Birth/Sex: Treating RN: 01-Jul-1935 (85 y.o. [name] Primary Care Cagney Steenson: [name] Other Clinician: Referring Chad Tiznado: Treating Kristelle Cavallaro/Extender: Deloria Lair in Treatment: 5 Visit Information History Since Last Visit Added or deleted any medications: No Patient Arrived: Wheel Chair Any new allergies or adverse reactions: No Arrival Time: 13:08 Had a fall or experienced change in No Accompanied By: daughter activities of daily living that may affect Transfer Assistance: Manual risk of falls: Patient Identification Verified: Yes Signs or symptoms of abuse/neglect since last visito No Secondary Verification Process Completed: Yes Hospitalized since last visit: No Patient Requires Transmission-Based Precautions: No Implantable device outside of the clinic excluding No Patient Has Alerts: No cellular tissue based products placed in the center since last visit: Has Dressing in Place as Prescribed: Yes Pain Present Now: No Electronic Signature(s) Signed: 11/10/2020 3:40:40 PM By: Sandre Kitty Entered By: Sandre Kitty on 11/10/2020 13:08:49 -------------------------------------------------------------------------------- Encounter Discharge Information Details Patient Name: Date of Service: [name] 11/10/2020 12:30 PM Medical Record Number: [id number] Patient Account Number: [id number] Date of Birth/Sex: Treating RN: March 12, 1936 (85 y.o. [name] Primary Care Ariyanah Aguado: [name] Other Clinician: Referring Tagg Eustice: Treating Virgal Warmuth/Extender: Deloria Lair in Treatment: 5 Encounter Discharge Information Items Post Procedure Vitals Discharge Condition:  Stable Temperature (F): 97.8 Ambulatory Status: Wheelchair Pulse (bpm): 85 Discharge Destination: Home Respiratory Rate (breaths/min): 16 Transportation: Private Auto Blood Pressure (mmHg): 136/84 Accompanied By: daughter Schedule Follow-up Appointment: Yes Clinical Summary of Care: Provided on 11/10/2020 Form Type Recipient Paper Patient Patient Electronic Signature(s) Signed: 11/10/2020 7:08:24 PM By: Lorrin Jackson Entered By: Lorrin Jackson on 11/10/2020 13:58:30 -------------------------------------------------------------------------------- Lower Extremity Assessment Details Patient Name: Date of Service: [name], [name] 11/10/2020 12:30 PM Medical Record Number: [id number] Patient Account Number: [id number] Date of Birth/Sex: Treating RN: Mar 07, 1936 (85 y.o. [name] Primary Care Evaleen Sant: [name] Other Clinician: Referring Kamica Florance: Treating Devonda Pequignot/Extender: Deloria Lair in Treatment: 5 Electronic Signature(s) Signed: 11/10/2020 7:08:24 PM By: Lorrin Jackson Entered By: Lorrin Jackson on 11/10/2020 13:32:58 -------------------------------------------------------------------------------- Multi Wound Chart Details Patient Name: Date of Service: [name], [name] 11/10/2020 12:30 PM Medical Record Number: [id number] Patient Account Number: [id number] Date of Birth/Sex: Treating RN: 08/31/1935 (85 y.o. [name] Primary Care Cejay Cambre: [name] Other Clinician: Referring Isaul Landi: Treating Elo Marmolejos/Extender: Deloria Lair in Treatment: 5 Vital Signs Height(in): 45 Capillary Blood Glucose(mg/dl): 173 Weight(lbs): 110 Pulse(bpm): 67 Body Mass Index(BMI): 17 Blood Pressure(mmHg): 136/84 Temperature(F): 97.8 Respiratory Rate(breaths/min): 16 Photos: [2:No Photos Left Gluteus] [N/A:N/A N/A] Wound Location: [2:Pressure Injury] [N/A:N/A] Wounding Event: [2:Pressure Ulcer] [N/A:N/A] Primary  Etiology: [2:Anemia, Deep Vein Thrombosis,] [N/A:N/A] Comorbid History: [2:Hypertension, Type II Diabetes, Dementia 09/18/2020] [N/A:N/A] Date Acquired: [2:5] [N/A:N/A] Weeks of Treatment: [2:Open] [N/A:N/A] Wound Status: [2:7.5x5.5x2.2] [N/A:N/A] Measurements L x W x D (cm) [2:32.398] [N/A:N/A] A (cm) : rea [2:71.275] [N/A:N/A] Volume (cm) : [2:22.20%] [N/A:N/A] % Reduction in A rea: [2:-1610.90%] [N/A:N/A] % Reduction in Volume: [2:1] Starting Position 1 (o'clock): [2:4] Ending Position 1 (o'clock): [2:4] Maximum Distance 1 (cm): [2:Yes] [N/A:N/A] Undermining: [2:Unstageable/Unclassified] [N/A:N/A] Classification: [2:Medium] [N/A:N/A] Exudate A mount: [2:Serosanguineous] [N/A:N/A] Exudate Type: [2:red, brown] [N/A:N/A] Exudate Color: [2:Well defined, not attached] [N/A:N/A] Wound Margin: [2:Large (67-100%)] [N/A:N/A] Granulation A mount: [2:Small (1-33%)] [N/A:N/A] Necrotic A mount: [2:Fat Layer (Subcutaneous Tissue): Yes N/A] Exposed Structures: [  2:Fascia: No Tendon: No Muscle: No Joint: No Bone: No None] [N/A:N/A] Epithelialization: [2:Debridement - Excisional] [N/A:N/A] Debridement: [2:13:40] [N/A:N/A] Pre-procedure Verification/Time Out Taken: [2:Other] [N/A:N/A] Pain Control: [2:Subcutaneous, Slough] [N/A:N/A] Tissue Debrided: [2:Skin/Subcutaneous Tissue] [N/A:N/A] Level: [2:41.25] [N/A:N/A] Debridement A (sq cm): [2:rea Curette] [N/A:N/A] Instrument: [2:Minimum] [N/A:N/A] Bleeding: [2:Pressure] [N/A:N/A] Hemostasis A chieved: [2:Procedure was tolerated well] [N/A:N/A] Debridement Treatment Response: [2:7.5x5.5x2.2] [N/A:N/A] Post Debridement Measurements L x W x D (cm) [2:71.275] [N/A:N/A] Post Debridement Volume: (cm) [2:Unstageable/Unclassified] [N/A:N/A] Post Debridement Stage: [2:Debridement] [N/A:N/A] Treatment Notes Wound #2 (Gluteus) Wound Laterality: Left Cleanser Soap and Water Discharge Instruction: May shower and wash wound with dial antibacterial  soap and water prior to dressing change. Peri-Wound Care Topical Primary Dressing Promogran Prisma Matrix, 4.34 (sq in) (silver collagen) Discharge Instruction: Apply moistened collagen to undermined area from 1-4 Secondary Dressing Woven Gauze Sponge, Non-Sterile 4x4 in Discharge Instruction: Lightly pack Hydrogel moistened gauze to wound bed ABD Pad, 8x10 Discharge Instruction: Apply over primary dressing as directed. Secured With 89M Medipore H Soft Cloth Surgical T 4 x 2 (in/yd) ape Discharge Instruction: Secure dressing with tape as directed. Compression Wrap Compression Stockings Add-Ons Electronic Signature(s) Signed: 11/10/2020 7:08:24 PM By: Lorrin Jackson Signed: 11/11/2020 1:36:13 PM By: Linton Ham MD Entered By: Linton Ham on 11/10/2020 13:52:55 -------------------------------------------------------------------------------- Multi-Disciplinary Care Plan Details Patient Name: Date of Service: [place] 11/10/2020 12:30 PM Medical Record Number: [id number] Patient Account Number: [id number] Date of Birth/Sex: Treating RN: 15-Aug-1935 (85 y.o. [name] Primary Care : [name] Other Clinician: Referring : Treating /Extender: Deloria Lair in Treatment: 5 Active Inactive Wound/Skin Impairment Nursing Diagnoses: Impaired tissue integrity Goals: Patient/caregiver will verbalize understanding of skin care regimen Date Initiated: 10/02/2020 Date Inactivated: 11/10/2020 Target Resolution Date: 10/30/2020 Goal Status: Met Ulcer/skin breakdown will have a volume reduction of 30% by week 4 Date Initiated: 10/02/2020 Target Resolution Date: 11/24/2020 Goal Status: Active Interventions: Assess patient/caregiver ability to obtain necessary supplies Assess patient/caregiver ability to perform ulcer/skin care regimen upon admission and as needed Assess ulceration(s) every visit Provide education on ulcer  and skin care Treatment Activities: Skin care regimen initiated : 10/02/2020 Topical wound management initiated : 10/02/2020 Notes: 11/10/20 : Goal not yet met, eschar debrided off increasing wound size. Electronic Signature(s) Signed: 11/10/2020 7:08:24 PM By: Lorrin Jackson Entered By: Lorrin Jackson on 11/10/2020 13:37:36 -------------------------------------------------------------------------------- Pain Assessment Details Patient Name: Date of Service: DAILYNN, NANCARROW 11/10/2020 12:30 PM Medical Record Number: [id number] Patient Account Number: [id number] Date of Birth/Sex: Treating RN: February 13, 1936 (85 y.o. [name] Primary Care : [name] Other Clinician: Referring : Treating /Extender: Deloria Lair in Treatment: 5 Active Problems Location of Pain Severity and Description of Pain Patient Has Paino No Site Locations Pain Management and Medication Current Pain Management: Electronic Signature(s) Signed: 11/10/2020 3:40:40 PM By: Sandre Kitty Signed: 11/10/2020 7:08:24 PM By: Lorrin Jackson Entered By: Sandre Kitty on 11/10/2020 13:09:30 -------------------------------------------------------------------------------- Patient/Caregiver Education Details Patient Name: Date of Service: [name] 7/5/2022andnbsp12:30 PM Medical Record Number: [id number] Patient Account Number: [id number] Date of Birth/Gender: Treating RN: [birth year] (85 y.o. [name] Primary Care Physician: [name] Other Clinician: Referring Physician: Treating Physician/Extender: Deloria Lair in Treatment: 5 Education Assessment Education Provided To: Caregiver Education Topics Provided Pressure: Methods: Explain/Verbal, Printed Responses: State content correctly Wound/Skin Impairment: Methods: Demonstration, Explain/Verbal, Printed Responses: State content correctly Electronic  Signature(s) Signed: 11/10/2020 7:08:24 PM By: Lorrin Jackson Entered By: Lorrin Jackson on 11/10/2020 13:38:02 -------------------------------------------------------------------------------- Wound  Assessment Details Patient Name: Date of Service: MATTISYN, CARDONA 11/10/2020 12:30 PM Medical Record Number: [id number] Patient Account Number: [id number] Date of Birth/Sex: Treating RN: 1936-02-07 (85 y.o. [name] Primary Care : [name] Other Clinician: Referring : Treating /Extender: Deloria Lair in Treatment: 5 Wound Status Wound Number: 2 Primary Pressure Ulcer Etiology: Wound Location: Left Gluteus Wound Status: Open Wounding Event: Pressure Injury Comorbid Anemia, Deep Vein Thrombosis, Hypertension, Type II Date Acquired: 09/18/2020 History: Diabetes, Dementia Weeks Of Treatment: 5 Clustered Wound: No Photos Wound Measurements Length: (cm) 7.5 Width: (cm) 5.5 Depth: (cm) 2.2 Area: (cm) 32.398 Volume: (cm) 71.275 % Reduction in Area: 22.2% % Reduction in Volume: -1610.9% Epithelialization: None Tunneling: No Undermining: Yes Starting Position (o'clock): 1 Ending Position (o'clock): 4 Maximum Distance: (cm) 4 Wound Description Classification: Unstageable/Unclassified Wound Margin: Well defined, not attached Exudate Amount: Medium Exudate Type: Serosanguineous Exudate Color: red, brown Foul Odor After Cleansing: No Slough/Fibrino Yes Wound Bed Granulation Amount: Large (67-100%) Exposed Structure Necrotic Amount: Small (1-33%) Fascia Exposed: No Necrotic Quality: Adherent Slough Fat Layer (Subcutaneous Tissue) Exposed: Yes Tendon Exposed: No Muscle Exposed: No Joint Exposed: No Bone Exposed: No Treatment Notes Wound #2 (Gluteus) Wound Laterality: Left Cleanser Soap and Water Discharge Instruction: May shower and wash wound with dial antibacterial soap and water prior to dressing  change. Peri-Wound Care Topical Primary Dressing Promogran Prisma Matrix, 4.34 (sq in) (silver collagen) Discharge Instruction: Apply moistened collagen to undermined area from 1-4 Secondary Dressing Woven Gauze Sponge, Non-Sterile 4x4 in Discharge Instruction: Lightly pack Hydrogel moistened gauze to wound bed ABD Pad, 8x10 Discharge Instruction: Apply over primary dressing as directed. Secured With 54M Medipore H Soft Cloth Surgical T 4 x 2 (in/yd) ape Discharge Instruction: Secure dressing with tape as directed. Compression Wrap Compression Stockings Add-Ons Electronic Signature(s) Signed: 11/11/2020 5:22:20 PM By: Sandre Kitty Signed: 11/11/2020 5:24:26 PM By: Lorrin Jackson Signed: 11/11/2020 5:24:26 PM By: Lorrin Jackson Previous Signature: 11/10/2020 7:08:24 PM Version By: Lorrin Jackson Entered By: Sandre Kitty on 11/11/2020 14:47:33 -------------------------------------------------------------------------------- [place] Details Patient Name: Date of Service: AOWYN, ROZEBOOM 11/10/2020 12:30 PM Medical Record Number: [id number] Patient Account Number: [id number] Date of Birth/Sex: Treating RN: 1935/12/30 (85 y.o. [name] Primary Care : [name] Other Clinician: Referring : Treating /Extender: Deloria Lair in Treatment: 5 Vital Signs Time Taken: 13:08 Temperature (F): 97.8 Height (in): 68 Pulse (bpm): 85 Weight (lbs): 110 Respiratory Rate (breaths/min): 16 Body Mass Index (BMI): 16.7 Blood Pressure (mmHg): 136/84 Capillary Blood Glucose (mg/dl): 173 Reference Range: 80 - 120 mg / dl Electronic Signature(s) Signed: 11/10/2020 3:40:40 PM By: Sandre Kitty Entered By: Sandre Kitty on 11/10/2020 13:09:24

## 2020-11-11 NOTE — Progress Notes (Signed)
[name] ([id number]) , Visit Report for 11/10/2020 [name] Patient Name: Date of Service: [name], [name] 11/10/2020 12:30 PM Medical Record Number: [id number] Patient Account Number: [id number] Date of Birth/Sex: Treating RN: [birth year] (85 y.o. [name] Primary Care Provider: [name] Other Clinician: Referring Provider: Treating Provider/Extender: Deloria Lair in Treatment: 5 Carolyn Performed for Assessment: Wound #2 Left Gluteus Performed By: Physician Ricard Dillon., MD Carolyn Type: Carolyn Level of Consciousness (Pre-procedure): Awake and Alert Pre-procedure Verification/Time Out Yes - 13:40 Taken: Start Time: 13:41 Pain Control: Other : Benzocaine T Area Debrided (L x W): otal 3 (cm) x 5.5 (cm) = 16.5 (cm) Tissue and other material debrided: Non-Viable, Slough, Subcutaneous, Slough Level: Skin/Subcutaneous Tissue Carolyn Description: Excisional Instrument: Curette Bleeding: Minimum Hemostasis Achieved: Pressure End Time: 13:45 Response to Treatment: Procedure was tolerated well Level of Consciousness (Post- Awake and Alert procedure): Post Carolyn Measurements of Total Wound Length: (cm) 7.5 Stage: Unstageable/Unclassified Width: (cm) 5.5 Depth: (cm) 2.2 Volume: (cm) 71.275 Character of Wound/Ulcer Post Carolyn: Stable Post Procedure Diagnosis Same as Pre-procedure Electronic Signature(s) Signed: 11/10/2020 7:08:24 PM By: Lorrin Jackson Signed: 11/11/2020 1:36:13 PM By: Linton Ham MD Entered By: Linton Ham on 11/10/2020 13:58:10 -------------------------------------------------------------------------------- HPI Contreras Patient Name: Date of Service: [name] 11/10/2020 12:30 PM Medical Record Number: [id number] Patient Account Number: [id number] Date of Birth/Sex: Treating RN: Sep 27, 1935 (85 y.o. [name] Primary Care Provider: [name] Other  Clinician: Referring Provider: Treating Provider/Extender: Deloria Lair in Treatment: 5 History of Present Illness HPI Description: Admission 5/27 [name] is an 85 year old female with a past medical history of type 2 diabetes on oral agents, hypertension and dementia that presents today to the clinic for a wound to her right buttocks. Her daughter-in-law is present today and helps provide the history. This issue has been going on for 2 to 3 weeks now. She states she developed an eschar about 1 week ago. She has been taking doxycycline for possible soft tissue infection to the area prescribed by her primary care provider. Patient currently denies any signs of infection. 6/3; patient admitted to the clinic last week. She has a large necrotic area on her left buttock. They use Santyl last week. On arrival this visit she had completely necrotic surface with open to subcutaneous tissue. The surface was completely nonviable. They have been using Santyl. She apparently had been on doxycycline which she is just finishing prescribed by her primary doctor for possible soft tissue infection 6/7; patient with a large necrotic wound over her left buttock. Aggressive Carolyn last week. Culture of this grew Proteus unfortunately would not of been covered well or at least predictably by the doxycycline that her primary doctor put her on. I have therefore put her on Augmentin suspension 3 times a day. We are using silver alginate as the primary dressing while we deal with the infection. Her daughter has a list of concerns including swallowing difficulties, concerns about aspiration. The patient has advanced dementia. If this is Alzheimer's disease it is at its preterminal stages. I went over that swallowing difficulties are part of what happens in the preterminal stages of Alzheimer's disease. Nevertheless we will family members seems to want to pursue a very aggressive  course 6/21 large wound over her left buttock. She has completed the antibiotics I gave her [Augmentin]. We are using silver alginate while we are dealing with the infection and also to help with the drainage In general the  wound looks better she has healthy granulation over perhaps 70% of it. Superiorly there is still necrotic debris I did not attempt to debride this today The patient has advanced dementia. I do not know that she could tolerate a wound VAC. She also has double incontinence which would make that difficult. Nevertheless she has been cared for diligently by her family. She is apparently eating well and may be 2 to 3 hours on this area all day 7/5; 2-week follow-up. She continues with a nice improvement in overall condition of the wound bed. The undermining area has still some adherent surface slough. We have been using silver alginate because of the drainage and possibility of at least superficial wound infection/colonization. Although I said in previous notes I wondered whether she could tolerate the wound VAC they have done so well with this wound at home I do not want to necessarily rule her out for a wound VAC and I am going to direct the start of a trial of wound VAC therapy if this can be covered by insurance, if we have home health support to change it at 7 Electronic Signature(s) Signed: 11/11/2020 1:36:13 PM By: Linton Ham MD Entered By: Linton Ham on 11/10/2020 13:54:45 -------------------------------------------------------------------------------- Physical Exam Contreras Patient Name: Date of Service: [name], Contreras 11/10/2020 12:30 PM Medical Record Number: [id number] Patient Account Number: [id number] Date of Birth/Sex: Treating RN: 04/19/1936 (85 y.o. [name] Primary Care Provider: [name] Other Clinician: Referring Provider: Treating Provider/Extender: Deloria Lair in Treatment: 5 Constitutional Sitting or  standing Blood Pressure is within target range for patient.. Pulse regular and within target range for patient.Marland Kitchen Respirations regular, non-labored and within target range.. Temperature is normal and within the target range for the patient.Marland Kitchen Appears in no distress. Notes Wound exam; left buttock. The only place where there was nonviable tissue here was in the undermining to the right. This in the extensive area. I debrided this with an open curette to remove the very adherent nonviable debris on the surface. Hemostasis with direct pressure this did go into subcutaneous Carolyn there was no exposed bone but in 1 small area exposed tendon. No evidence of surrounding soft tissue infection however Electronic Signature(s) Signed: 11/11/2020 1:36:13 PM By: Linton Ham MD Entered By: Linton Ham on 11/10/2020 13:55:46 -------------------------------------------------------------------------------- Physician Orders Contreras Patient Name: Date of Service: ZIAH, TURVEY 11/10/2020 12:30 PM Medical Record Number: [id number] Patient Account Number: [id number] Date of Birth/Sex: Treating RN: 1935-11-16 (85 y.o. [name] Primary Care Provider: [name] Other Clinician: Referring Provider: Treating Provider/Extender: Deloria Lair in Treatment: 5 Verbal / Phone Orders: No Diagnosis Coding ICD-10 Coding Code Description L89.320 Pressure ulcer of left buttock, unstageable E11.9 Type 2 diabetes mellitus without complications Follow-up Appointments ppointment in 2 weeks. - Tuesday with Dr. Dellia Nims Return A Negative Presssure Wound Therapy Wound #2 Left Gluteus Wound Vac to wound continuously at 133mm/hg pressure - 11/10/20:Will order wound vac from [employer] Off-Loading Low air-loss mattress (Group 2) - Old one not working, new one ordered Turn and reposition every 2 hours Additional Orders / Instructions Follow Nutritious Diet - 100-120g of  Protein Home Health New wound care orders this week; continue Home Health for wound care. May utilize formulary equivalent dressing for wound treatment orders unless otherwise specified. [facility] Monday's, Wednseday's, and Friday's Wound Treatment Wound #2 - Gluteus Wound Laterality: Left Cleanser: Soap and Water (Home Health) 1 x Per Day/30 Days Discharge Instructions: May  shower and wash wound with dial antibacterial soap and water prior to dressing change. Prim Dressing: Promogran Prisma Matrix, 4.34 (sq in) (silver collagen) 1 x Per Day/30 Days ary Discharge Instructions: Apply moistened collagen to undermined area from 1-4 Secondary Dressing: Woven Gauze Sponge, Non-Sterile 4x4 in (Home Health) 1 x Per Day/30 Days Discharge Instructions: Lightly pack Hydrogel moistened gauze to wound bed Secondary Dressing: ABD Pad, 8x10 (Home Health) 1 x Per Day/30 Days Discharge Instructions: Apply over primary dressing as directed. Secured With: 13M Medipore H Soft Cloth Surgical T 4 x 2 (in/yd) (Home Health) 1 x Per Day/30 Days ape Discharge Instructions: Secure dressing with tape as directed. Electronic Signature(s) Signed: 11/10/2020 7:08:24 PM By: Lorrin Jackson Signed: 11/11/2020 1:36:13 PM By: Linton Ham MD Entered By: Lorrin Jackson on 11/10/2020 13:49:39 -------------------------------------------------------------------------------- Problem List Contreras Patient Name: Date of Service: ORIE, CUTTINO 11/10/2020 12:30 PM Medical Record Number: [id number] Patient Account Number: [id number] Date of Birth/Sex: Treating RN: 12/05/1935 (85 y.o. [name] Primary Care Provider: [name] Other Clinician: Referring Provider: Treating Provider/Extender: Deloria Lair in Treatment: 5 Active Problems ICD-10 Encounter Encounter Code Description Active Date MDM Diagnosis L89.320 Pressure ulcer of left buttock, unstageable 10/13/2020 No Yes E11.9 Type  2 diabetes mellitus without complications 07/13/6281 No Yes Inactive Problems Resolved Problems Electronic Signature(s) Signed: 11/11/2020 1:36:13 PM By: Linton Ham MD Entered By: Linton Ham on 11/10/2020 13:52:48 -------------------------------------------------------------------------------- Progress Note Contreras Patient Name: Date of Service: [name] 11/10/2020 12:30 PM Medical Record Number: [id number] Patient Account Number: [id number] Date of Birth/Sex: Treating RN: 1935-12-20 (85 y.o. [name] Primary Care Provider: [name] Other Clinician: Referring Provider: Treating Provider/Extender: Deloria Lair in Treatment: 5 Subjective History of Present Illness (HPI) Admission 5/27 Ms. Leiann Sporer is an 85 year old female with a past medical history of type 2 diabetes on oral agents, hypertension and dementia that presents today to the clinic for a wound to her right buttocks. Her daughter-in-law is present today and helps provide the history. This issue has been going on for 2 to 3 weeks now. She states she developed an eschar about 1 week ago. She has been taking doxycycline for possible soft tissue infection to the area prescribed by her primary care provider. Patient currently denies any signs of infection. 6/3; patient admitted to the clinic last week. She has a large necrotic area on her left buttock. They use Santyl last week. On arrival this visit she had completely necrotic surface with open to subcutaneous tissue. The surface was completely nonviable. They have been using Santyl. She apparently had been on doxycycline which she is just finishing prescribed by her primary doctor for possible soft tissue infection 6/7; patient with a large necrotic wound over her left buttock. Aggressive Carolyn last week. Culture of this grew Proteus unfortunately would not of been covered well or at least predictably by the doxycycline  that her primary doctor put her on. I have therefore put her on Augmentin suspension 3 times a day. We are using silver alginate as the primary dressing while we deal with the infection. Her daughter has a list of concerns including swallowing difficulties, concerns about aspiration. The patient has advanced dementia. If this is Alzheimer's disease it is at its preterminal stages. I went over that swallowing difficulties are part of what happens in the preterminal stages of Alzheimer's disease. Nevertheless we will family members seems to want to pursue a very aggressive course 6/21 large wound over  her left buttock. She has completed the antibiotics I gave her [Augmentin]. We are using silver alginate while we are dealing with the infection and also to help with the drainage In general the wound looks better she has healthy granulation over perhaps 70% of it. Superiorly there is still necrotic debris I did not attempt to debride this today The patient has advanced dementia. I do not know that she could tolerate a wound VAC. She also has double incontinence which would make that difficult. Nevertheless she has been cared for diligently by her family. She is apparently eating well and may be 2 to 3 hours on this area all day 7/5; 2-week follow-up. She continues with a nice improvement in overall condition of the wound bed. The undermining area has still some adherent surface slough. We have been using silver alginate because of the drainage and possibility of at least superficial wound infection/colonization. Although I said in previous notes I wondered whether she could tolerate the wound VAC they have done so well with this wound at home I do not want to necessarily rule her out for a wound VAC and I am going to direct the start of a trial of wound VAC therapy if this can be covered by insurance, if we have home health support to change it at 7 Objective Constitutional Sitting or standing Blood  Pressure is within target range for patient.. Pulse regular and within target range for patient.Marland Kitchen Respirations regular, non-labored and within target range.. Temperature is normal and within the target range for the patient.Marland Kitchen Appears in no distress. Vitals Time Taken: 1:08 PM, Height: 68 in, Weight: 110 lbs, BMI: 16.7, Temperature: 97.8 F, Pulse: 85 bpm, Respiratory Rate: 16 breaths/min, Blood Pressure: 136/84 mmHg, Capillary Blood Glucose: 173 mg/dl. General Notes: Wound exam; left buttock. The only place where there was nonviable tissue here was in the undermining to the right. This in the extensive area. I debrided this with an open curette to remove the very adherent nonviable debris on the surface. Hemostasis with direct pressure this did go into subcutaneous Carolyn there was no exposed bone but in 1 small area exposed tendon. No evidence of surrounding soft tissue infection however Integumentary (Hair, Skin) Wound #2 status is Open. Original cause of wound was Pressure Injury. The date acquired was: 09/18/2020. The wound has been in treatment 5 weeks. The wound is located on the Left Gluteus. The wound measures 7.5cm length x 5.5cm width x 2.2cm depth; 32.398cm^2 area and 71.275cm^3 volume. There is Fat Layer (Subcutaneous Tissue) exposed. There is no tunneling noted, however, there is undermining starting at 1:00 and ending at 4:00 with a maximum distance of 4cm. There is a medium amount of serosanguineous drainage noted. The wound margin is well defined and not attached to the wound base. There is large (67-100%) granulation within the wound bed. There is a small (1-33%) amount of necrotic tissue within the wound bed including Adherent Slough. Assessment Active Problems ICD-10 Pressure ulcer of left buttock, unstageable Type 2 diabetes mellitus without complications Procedures Wound #2 Pre-procedure diagnosis of Wound #2 is a Pressure Ulcer located on the Left Gluteus . There was a  Excisional Skin/Subcutaneous Tissue Carolyn with a total area of 41.25 sq cm performed by Ricard Dillon., MD. With the following instrument(s): Curette to remove Non-Viable tissue/material. Material removed includes Subcutaneous Tissue and Slough and after achieving pain control using Other (Benzocaine). No specimens were taken. A time out was conducted at 13:40, prior to the start  of the procedure. A Minimum amount of bleeding was controlled with Pressure. The procedure was tolerated well. Post Carolyn Measurements: 7.5cm length x 5.5cm width x 2.2cm depth; 71.275cm^3 volume. Post Carolyn Stage noted as Unstageable/Unclassified. Character of Wound/Ulcer Post Carolyn is stable. Post procedure Diagnosis Wound #2: Same as Pre-Procedure Plan Follow-up Appointments: Return Appointment in 2 weeks. - Tuesday with Dr. Dellia Nims Negative Presssure Wound Therapy: Wound #2 Left Gluteus: Wound Vac to wound continuously at 148mm/hg pressure - 11/10/20:Will order wound vac from [employer] Off-Loading: Low air-loss mattress (Group 2) - Old one not working, new one ordered Turn and reposition every 2 hours Additional Orders / Instructions: Follow Nutritious Diet - 100-120g of Protein Home Health: New wound care orders this week; continue Home Health for wound care. May utilize formulary equivalent dressing for wound treatment orders unless otherwise specified. [facility] Monday's, Wednseday's, and Friday's WOUND #2: - Gluteus Wound Laterality: Left Cleanser: Soap and Water (Home Health) 1 x Per Day/30 Days Discharge Instructions: May shower and wash wound with dial antibacterial soap and water prior to dressing change. Prim Dressing: Promogran Prisma Matrix, 4.34 (sq in) (silver collagen) 1 x Per Day/30 Days ary Discharge Instructions: Apply moistened collagen to undermined area from 1-4 Secondary Dressing: Woven Gauze Sponge, Non-Sterile 4x4 in (Home Health) 1 x Per Day/30  Days Discharge Instructions: Lightly pack Hydrogel moistened gauze to wound bed Secondary Dressing: ABD Pad, 8x10 (Home Health) 1 x Per Day/30 Days Discharge Instructions: Apply over primary dressing as directed. Secured With: 55M Medipore H Soft Cloth Surgical T 4 x 2 (in/yd) (Home Health) 1 x Per Day/30 Days ape Discharge Instructions: Secure dressing with tape as directed. 1. I changed the dressing to silver collagen with hydrogel backing wet-to-dry. The collagen will only be in the undermining area to see if we can stimulate granulation 2. I still see no evidence of infection no exposed bone. 3. In spite of the fact that the patient has advanced dementia and perhaps has some degree of malnutrition I do not want to preclude her from a trial of a wound VAC which is probably the best opportunity we will have to get some closure of this wound. The caregivers do a very good job of keeping her off this, keeping incontinence from the wound etc. We will see if we can get this through insurance and whether we have enough home health support to get this done Electronic Signature(s) Signed: 11/11/2020 1:36:13 PM By: Linton Ham MD Entered By: Linton Ham on 11/10/2020 13:57:29 -------------------------------------------------------------------------------- SuperBill Contreras Patient Name: Date of Service: [name] 11/10/2020 Medical Record Number: [id number] Patient Account Number: [id number] Date of Birth/Sex: Treating RN: Sep 16, 1935 (85 y.o. [name] Primary Care Provider: [name] Other Clinician: Referring Provider: Treating Provider/Extender: Deloria Lair in Treatment: 5 Diagnosis Coding ICD-10 Codes Code Description [phone] Pressure ulcer of left buttock, unstageable E11.9 Type 2 diabetes mellitus without complications Facility Procedures CPT4 Code: [id number] Description: 25852 - DEB SUBQ TISSUE 20 SQ CM/< ICD-10 Diagnosis  Description L89.320 Pressure ulcer of left buttock, unstageable Modifier: Quantity: 1 Physician Procedures : CPT4 Code Description Modifier [id number] - WC PHYS SUBQ TISS 20 SQ CM ICD-10 Diagnosis Description L89.320 Pressure ulcer of left buttock, unstageable Quantity: 1 Electronic Signature(s) Signed: 11/11/2020 1:36:13 PM By: Linton Ham MD Entered By: Linton Ham on 11/10/2020 13:58:28

## 2020-11-12 DIAGNOSIS — E785 Hyperlipidemia, unspecified: Secondary | ICD-10-CM | POA: Diagnosis not present

## 2020-11-12 DIAGNOSIS — L03312 Cellulitis of back [any part except buttock]: Secondary | ICD-10-CM | POA: Diagnosis not present

## 2020-11-12 DIAGNOSIS — L89320 Pressure ulcer of left buttock, unstageable: Secondary | ICD-10-CM | POA: Diagnosis not present

## 2020-11-12 DIAGNOSIS — L89150 Pressure ulcer of sacral region, unstageable: Secondary | ICD-10-CM | POA: Diagnosis not present

## 2020-11-12 DIAGNOSIS — F0390 Unspecified dementia without behavioral disturbance: Secondary | ICD-10-CM | POA: Diagnosis not present

## 2020-11-12 DIAGNOSIS — I890 Lymphedema, not elsewhere classified: Secondary | ICD-10-CM | POA: Diagnosis not present

## 2020-11-17 DIAGNOSIS — I890 Lymphedema, not elsewhere classified: Secondary | ICD-10-CM | POA: Diagnosis not present

## 2020-11-17 DIAGNOSIS — F0390 Unspecified dementia without behavioral disturbance: Secondary | ICD-10-CM | POA: Diagnosis not present

## 2020-11-17 DIAGNOSIS — L03312 Cellulitis of back [any part except buttock]: Secondary | ICD-10-CM | POA: Diagnosis not present

## 2020-11-17 DIAGNOSIS — E785 Hyperlipidemia, unspecified: Secondary | ICD-10-CM | POA: Diagnosis not present

## 2020-11-17 DIAGNOSIS — L89320 Pressure ulcer of left buttock, unstageable: Secondary | ICD-10-CM | POA: Diagnosis not present

## 2020-11-17 DIAGNOSIS — L89150 Pressure ulcer of sacral region, unstageable: Secondary | ICD-10-CM | POA: Diagnosis not present

## 2020-11-18 DIAGNOSIS — L89320 Pressure ulcer of left buttock, unstageable: Secondary | ICD-10-CM | POA: Diagnosis not present

## 2020-11-18 DIAGNOSIS — L03312 Cellulitis of back [any part except buttock]: Secondary | ICD-10-CM | POA: Diagnosis not present

## 2020-11-18 DIAGNOSIS — F0390 Unspecified dementia without behavioral disturbance: Secondary | ICD-10-CM | POA: Diagnosis not present

## 2020-11-18 DIAGNOSIS — I890 Lymphedema, not elsewhere classified: Secondary | ICD-10-CM | POA: Diagnosis not present

## 2020-11-18 DIAGNOSIS — L89150 Pressure ulcer of sacral region, unstageable: Secondary | ICD-10-CM | POA: Diagnosis not present

## 2020-11-18 DIAGNOSIS — E785 Hyperlipidemia, unspecified: Secondary | ICD-10-CM | POA: Diagnosis not present

## 2020-11-23 DIAGNOSIS — L89150 Pressure ulcer of sacral region, unstageable: Secondary | ICD-10-CM | POA: Diagnosis not present

## 2020-11-23 DIAGNOSIS — F0390 Unspecified dementia without behavioral disturbance: Secondary | ICD-10-CM | POA: Diagnosis not present

## 2020-11-23 DIAGNOSIS — L03312 Cellulitis of back [any part except buttock]: Secondary | ICD-10-CM | POA: Diagnosis not present

## 2020-11-23 DIAGNOSIS — L89320 Pressure ulcer of left buttock, unstageable: Secondary | ICD-10-CM | POA: Diagnosis not present

## 2020-11-23 DIAGNOSIS — I890 Lymphedema, not elsewhere classified: Secondary | ICD-10-CM | POA: Diagnosis not present

## 2020-11-23 DIAGNOSIS — E785 Hyperlipidemia, unspecified: Secondary | ICD-10-CM | POA: Diagnosis not present

## 2020-11-24 ENCOUNTER — Encounter (HOSPITAL_BASED_OUTPATIENT_CLINIC_OR_DEPARTMENT_OTHER): Payer: Medicare Other | Admitting: Internal Medicine

## 2020-11-24 ENCOUNTER — Other Ambulatory Visit: Payer: Self-pay

## 2020-11-24 DIAGNOSIS — L89324 Pressure ulcer of left buttock, stage 4: Secondary | ICD-10-CM | POA: Diagnosis not present

## 2020-11-24 DIAGNOSIS — F0390 Unspecified dementia without behavioral disturbance: Secondary | ICD-10-CM | POA: Diagnosis not present

## 2020-11-24 DIAGNOSIS — E119 Type 2 diabetes mellitus without complications: Secondary | ICD-10-CM | POA: Diagnosis not present

## 2020-11-24 NOTE — Progress Notes (Signed)
[facility] ([id number]) , Visit Report for 11/24/2020 HPI Details Patient Name: Date of Service: [name], [name] 11/24/2020 9:00 A M Medical Record Number: [id number] Patient Account Number: [id number] Date of Birth/Sex: Treating RN: [birth year], [name] (85 y.o. [name] Primary Care Provider: [name] Other Clinician: Referring Provider: Treating Provider/Extender: Deloria Lair in Treatment: 7 History of Present Illness HPI Description: Admission 5/27 [name] is an 85 year old female with a past medical history of type 2 diabetes on oral agents, hypertension and dementia that presents today to the clinic for a wound to her right buttocks. Her daughter-in-law is present today and helps provide the history. This issue has been going on for 2 to 3 weeks now. She states she developed an eschar about 1 week ago. She has been taking doxycycline for possible soft tissue infection to the area prescribed by her primary care provider. Patient currently denies any signs of infection. 6/3; patient admitted to the clinic last week. She has a large necrotic area on her left buttock. They use Santyl last week. On arrival this visit she had completely necrotic surface with open to subcutaneous tissue. The surface was completely nonviable. They have been using Santyl. She apparently had been on doxycycline which she is just finishing prescribed by her primary doctor for possible soft tissue infection 6/7; patient with a large necrotic wound over her left buttock. Aggressive debridement last week. Culture of this grew Proteus unfortunately would not of been covered well or at least predictably by the doxycycline that her primary doctor put her on. I have therefore put her on Augmentin suspension 3 times a day. We are using silver alginate as the primary dressing while we deal with the infection. Her daughter has a list of concerns including swallowing  difficulties, concerns about aspiration. The patient has advanced dementia. If this is Alzheimer's disease it is at its preterminal stages. I went over that swallowing difficulties are part of what happens in the preterminal stages of Alzheimer's disease. Nevertheless we will family members seems to want to pursue a very aggressive course 6/21 large wound over her left buttock. She has completed the antibiotics I gave her [Augmentin]. We are using silver alginate while we are dealing with the infection and also to help with the drainage In general the wound looks better she has healthy granulation over perhaps 70% of it. Superiorly there is still necrotic debris I did not attempt to debride this today The patient has advanced dementia. I do not know that she could tolerate a wound VAC. She also has double incontinence which would make that difficult. Nevertheless she has been cared for diligently by her family. She is apparently eating well and may be 2 to 3 hours on this area all day 7/5; 2-week follow-up. She continues with a nice improvement in overall condition of the wound bed. The undermining area has still some adherent surface slough. We have been using silver alginate because of the drainage and possibility of at least superficial wound infection/colonization. Although I said in previous notes I wondered whether she could tolerate the wound VAC they have done so well with this wound at home I do not want to necessarily rule her out for a wound VAC and I am going to direct the start of a trial of wound VAC therapy if this can be covered by insurance, if we have home health support to change it at all 7/19; we did not get very far with the wound  VAC because my original ICD 10 said this was unstageable. As usual this represents a considerable frustration because we would not of ordered a wound VAC if the wound was still unstageable. This currently represents a stage IV staging. This is all the  way down through muscle layer. There is a rim of tissue over the bone and no palpable exposed bone but this is a deep wound. Fortunately at present no evidence of infection. We have been using silver collagen with backing wet-to-dry. The wound is really no better today but no worse Electronic Signature(s) Signed: 11/24/2020 5:24:30 PM By: Linton Ham MD Entered By: Linton Ham on 11/24/2020 09:49:02 -------------------------------------------------------------------------------- Physical Exam Details Patient Name: Date of Service: [name] 11/24/2020 9:00 A M Medical Record Number: [id number] Patient Account Number: [id number] Date of Birth/Sex: Treating RN: Apr [birth year], [name] (85 y.o. [name] Primary Care Provider: [name] Other Clinician: Referring Provider: Treating Provider/Extender: Deloria Lair in Treatment: 7 Constitutional Patient is hypertensive.. Pulse regular and within target range for patient.Marland Kitchen Respirations regular, non-labored and within target range.. Temperature is normal and within the target range for the patient.Marland Kitchen Appears in no distress. Notes Wound exam left buttock. No nonviable tissue. This represents an improvement as as far as I can see up into the undermining area there is no debris on the surface. Undermining from about 12-5 o'clock at 3.5 cm. But again no palpable or obvious exposed bone. No surrounding tenderness no purulence Electronic Signature(s) Signed: 11/24/2020 5:24:30 PM By: Linton Ham MD Entered By: Linton Ham on 11/24/2020 09:50:05 -------------------------------------------------------------------------------- Physician Orders Details Patient Name: Date of Service: [name] 11/24/2020 9:00 [place] Record Number: [id number] Patient Account Number: [id number] Date of Birth/Sex: Treating RN: [birth year]-[name] (85 y.o. [name] Primary Care Provider: [name] Other  Clinician: Referring Provider: Treating Provider/Extender: Deloria Lair in Treatment: 7 Verbal / Phone Orders: No Diagnosis Coding Follow-up Appointments ppointment in 2 weeks. - Tuesday with Dr. Dellia Nims Return A Negative Presssure Wound Therapy Wound #2 Left Gluteus Wound Vac to wound continuously at 112mm/hg pressure - Once delivered, wound vac to be put on by home health Black and White Foam combination - White foam to undermining and black foam to wound bed. Bridge with black foam to patient's side to relieve pressure point. Off-Loading Low air-loss mattress (Group 2) - Old one not working, new one ordered Turn and reposition every 2 hours Additional Orders / Instructions Follow Nutritious Diet - 100-120g of Protein Home Health New wound care orders this week; continue Home Health for wound care. May utilize formulary equivalent dressing for wound treatment orders unless otherwise specified. [facility] Monday's, Wednseday's, and Friday's Wound Treatment Wound #2 - Gluteus Wound Laterality: Left Cleanser: Soap and Water (Home Health) 1 x Per Day/30 Days Discharge Instructions: May shower and wash wound with dial antibacterial soap and water prior to dressing change. Prim Dressing: Promogran Prisma Matrix, 4.34 (sq in) (silver collagen) 1 x Per Day/30 Days ary Discharge Instructions: Apply moistened collagen to undermined area from 1-4 Secondary Dressing: Woven Gauze Sponge, Non-Sterile 4x4 in (Home Health) 1 x Per Day/30 Days Discharge Instructions: Lightly pack Hydrogel moistened gauze to wound bed Secondary Dressing: ABD Pad, 8x10 (Home Health) 1 x Per Day/30 Days Discharge Instructions: Apply over primary dressing as directed. Secured With: 3M Medipore H Soft Cloth Surgical T 4 x 2 (in/yd) (Home Health) 1 x Per Day/30 Days ape Discharge Instructions: Secure dressing with tape as directed.  Electronic Signature(s) Signed: 11/24/2020 5:24:30 PM By:  Linton Ham MD Signed: 11/24/2020 5:32:12 PM By: Rhae Hammock RN Entered By: Rhae Hammock on 11/24/2020 09:41:11 -------------------------------------------------------------------------------- Problem List Details Patient Name: Date of Service: [name] 11/24/2020 9:00 A M Medical Record Number: [id number] Patient Account Number: [id number] Date of Birth/Sex: Treating RN: October [birth year], [name] (85 y.o. [name], Lauren Primary Care Provider: [name] Other Clinician: Referring Provider: Treating Provider/Extender: Deloria Lair in Treatment: 7 Active Problems ICD-10 Encounter Code Description Active Date MDM Diagnosis L89.324 Pressure ulcer of left buttock, stage 4 11/24/2020 No Yes E11.9 Type 2 diabetes mellitus without complications 4/0/9811 No Yes Inactive Problems ICD-10 Code Description Active Date Inactive Date L89.320 Pressure ulcer of left buttock, unstageable 10/13/2020 10/13/2020 Resolved Problems Electronic Signature(s) Signed: 11/24/2020 5:24:30 PM By: Linton Ham MD Entered By: Linton Ham on 11/24/2020 09:46:53 -------------------------------------------------------------------------------- Progress Note Details Patient Name: Date of Service: [name] 11/24/2020 9:00 A M Medical Record Number: [id number] Patient Account Number: [id number] Date of Birth/Sex: Treating RN: [name], [name] (85 y.o. [name] Primary Care Provider: [name] Other Clinician: Referring Provider: Treating Provider/Extender: Deloria Lair in Treatment: 7 Subjective History of Present Illness (HPI) Admission 5/27 Ms. Janesha Brissette is an 85 year old female with a past medical history of type 2 diabetes on oral agents, hypertension and dementia that presents today to the clinic for a wound to her right buttocks. Her daughter-in-law is present today and helps provide the history. This issue has been  going on for 2 to 3 weeks now. She states she developed an eschar about 1 week ago. She has been taking doxycycline for possible soft tissue infection to the area prescribed by her primary care provider. Patient currently denies any signs of infection. 6/3; patient admitted to the clinic last week. She has a large necrotic area on her left buttock. They use Santyl last week. On arrival this visit she had completely necrotic surface with open to subcutaneous tissue. The surface was completely nonviable. They have been using Santyl. She apparently had been on doxycycline which she is just finishing prescribed by her primary doctor for possible soft tissue infection 6/7; patient with a large necrotic wound over her left buttock. Aggressive debridement last week. Culture of this grew Proteus unfortunately would not of been covered well or at least predictably by the doxycycline that her primary doctor put her on. I have therefore put her on Augmentin suspension 3 times a day. We are using silver alginate as the primary dressing while we deal with the infection. Her daughter has a list of concerns including swallowing difficulties, concerns about aspiration. The patient has advanced dementia. If this is Alzheimer's disease it is at its preterminal stages. I went over that swallowing difficulties are part of what happens in the preterminal stages of Alzheimer's disease. Nevertheless we will family members seems to want to pursue a very aggressive course 6/21 large wound over her left buttock. She has completed the antibiotics I gave her [Augmentin]. We are using silver alginate while we are dealing with the infection and also to help with the drainage In general the wound looks better she has healthy granulation over perhaps 70% of it. Superiorly there is still necrotic debris I did not attempt to debride this today The patient has advanced dementia. I do not know that she could tolerate a wound VAC. She  also has double incontinence which would make that difficult. Nevertheless she has been cared for diligently by  her family. She is apparently eating well and may be 2 to 3 hours on this area all day 7/5; 2-week follow-up. She continues with a nice improvement in overall condition of the wound bed. The undermining area has still some adherent surface slough. We have been using silver alginate because of the drainage and possibility of at least superficial wound infection/colonization. Although I said in previous notes I wondered whether she could tolerate the wound VAC they have done so well with this wound at home I do not want to necessarily rule her out for a wound VAC and I am going to direct the start of a trial of wound VAC therapy if this can be covered by insurance, if we have home health support to change it at all 7/19; we did not get very far with the wound VAC because my original ICD 10 said this was unstageable. As usual this represents a considerable frustration because we would not of ordered a wound VAC if the wound was still unstageable. This currently represents a stage IV staging. This is all the way down through muscle layer. There is a rim of tissue over the bone and no palpable exposed bone but this is a deep wound. Fortunately at present no evidence of infection. We have been using silver collagen with backing wet-to-dry. The wound is really no better today but no worse Objective Constitutional Patient is hypertensive.. Pulse regular and within target range for patient.Marland Kitchen Respirations regular, non-labored and within target range.. Temperature is normal and within the target range for the patient.Marland Kitchen Appears in no distress. Vitals Time Taken: 9:25 AM, Height: 68 in, Weight: 110 lbs, BMI: 16.7, Temperature: 97.4 F, Pulse: 88 bpm, Respiratory Rate: 16 breaths/min, Blood Pressure: 161/85 mmHg, Capillary Blood Glucose: 105 mg/dl. General Notes: glucose per pt report General Notes:  Wound exam left buttock. No nonviable tissue. This represents an improvement as as far as I can see up into the undermining area there is no debris on the surface. Undermining from about 12-5 o'clock at 3.5 cm. But again no palpable or obvious exposed bone. No surrounding tenderness no purulence Integumentary (Hair, Skin) Wound #2 status is Open. Original cause of wound was Pressure Injury. The date acquired was: 09/18/2020. The wound has been in treatment 7 weeks. The wound is located on the Left Gluteus. The wound measures 7.3cm length x 5.2cm width x 3.2cm depth; 29.814cm^2 area and 95.404cm^3 volume. There is Fat Layer (Subcutaneous Tissue) exposed. There is no tunneling noted, however, there is undermining starting at 12:00 and ending at 5:00 with a maximum distance of 3.7cm. There is a medium amount of serosanguineous drainage noted. The wound margin is well defined and not attached to the wound base. There is large (67-100%) granulation within the wound bed. There is a small (1-33%) amount of necrotic tissue within the wound bed including Adherent Slough. Assessment Active Problems ICD-10 Pressure ulcer of left buttock, stage 4 Type 2 diabetes mellitus without complications Plan Follow-up Appointments: Return Appointment in 2 weeks. - Tuesday with Dr. Dellia Nims Negative Presssure Wound Therapy: Wound #2 Left Gluteus: Wound Vac to wound continuously at 112mm/hg pressure - Once delivered, wound vac to be put on by home health Black and White Foam combination - White foam to undermining and black foam to wound bed. Bridge with black foam to patient's side to relieve pressure point. Off-Loading: Low air-loss mattress (Group 2) - Old one not working, new one ordered Turn and reposition every 2 hours Additional Orders /  Instructions: Follow Nutritious Diet - 100-120g of Protein Home Health: New wound care orders this week; continue Home Health for wound care. May utilize formulary equivalent  dressing for wound treatment orders unless otherwise specified. [facility] Monday's, Wednseday's, and Friday's WOUND #2: - Gluteus Wound Laterality: Left Cleanser: Soap and Water (Home Health) 1 x Per Day/30 Days Discharge Instructions: May shower and wash wound with dial antibacterial soap and water prior to dressing change. Prim Dressing: Promogran Prisma Matrix, 4.34 (sq in) (silver collagen) 1 x Per Day/30 Days ary Discharge Instructions: Apply moistened collagen to undermined area from 1-4 Secondary Dressing: Woven Gauze Sponge, Non-Sterile 4x4 in (Home Health) 1 x Per Day/30 Days Discharge Instructions: Lightly pack Hydrogel moistened gauze to wound bed Secondary Dressing: ABD Pad, 8x10 (Home Health) 1 x Per Day/30 Days Discharge Instructions: Apply over primary dressing as directed. Secured With: 64M Medipore H Soft Cloth Surgical T 4 x 2 (in/yd) (Home Health) 1 x Per Day/30 Days ape Discharge Instructions: Secure dressing with tape as directed. 1. This currently would be judged a stage IV wound based on penetration of the wound to muscle. I have altered an ICD 10 code to reflect this although I generally dislike doing this. 2. The wound is now clean in terms of surface no overt infection. Although the patient has a lot going against her in terms of some degree of malnutrition, significant dementia I think she deserves a trial of the wound VACo 4 to 8 weeks. 3. She is not a candidate for plastic surgery 4. I discussed this in detail with her daughter in law. If we cannot get this to move with a wound VAC and appropriate dressings underneatho Continued collagen then I would say the likelihood of getting this to close out all are very remote and we may be looking at a strictly palliative dressing status Electronic Signature(s) Signed: 11/24/2020 5:24:30 PM By: Linton Ham MD Entered By: Linton Ham on 11/24/2020  09:55:27 -------------------------------------------------------------------------------- SuperBill Details Patient Name: Date of Service: [name] 11/24/2020 Medical Record Number: [id number] Patient Account Number: [id number] Date of Birth/Sex: Treating RN: [birth year], [name] (85 y.o. [name], Lauren Primary Care Provider: [name] Other Clinician: Referring Provider: Treating Provider/Extender: Deloria Lair in Treatment: 7 Diagnosis Coding ICD-10 Codes Code Description [phone] Pressure ulcer of left buttock, unstageable E11.9 Type 2 diabetes mellitus without complications Facility Procedures CPT4 Code: [id number] Description: 99213 - WOUND CARE VISIT-LEV 3 EST PT Modifier: Quantity: 1 Physician Procedures : CPT4 Code Description Modifier [id number] - WC PHYS LEVEL 4 - EST PT ICD-10 Diagnosis Description L89.320 Pressure ulcer of left buttock, unstageable E11.9 Type 2 diabetes mellitus without complications Quantity: 1 Electronic Signature(s) Signed: 11/24/2020 5:24:30 PM By: Linton Ham MD Entered By: Linton Ham on 11/24/2020 09:55:37

## 2020-11-24 NOTE — Progress Notes (Signed)
[name] ([id number]) , Visit Report for 11/24/2020 Arrival Information Details Patient Name: Date of Service: [name], [name] 11/24/2020 9:00 [place] Record Number: [id number] Patient Account Number: [id number] Date of Birth/Sex: Treating RN: 08-03-1935 (85 y.o. [name] Primary Care Adonus Uselman: [name] Other Clinician: Referring Allia Wiltsey: Treating Glorie Dowlen/Extender: Deloria Lair in Treatment: 7 Visit Information History Since Last Visit Added or deleted any medications: No Patient Arrived: Wheel Chair Any new allergies or adverse reactions: No Arrival Time: 09:25 Had a fall or experienced change in No Accompanied By: caregiver activities of daily living that may affect Transfer Assistance: Manual risk of falls: Patient Identification Verified: Yes Signs or symptoms of abuse/neglect since last visito No Secondary Verification Process Completed: Yes Hospitalized since last visit: No Patient Requires Transmission-Based Precautions: No Implantable device outside of the clinic excluding No Patient Has Alerts: No cellular tissue based products placed in the center since last visit: Has Dressing in Place as Prescribed: Yes Pain Present Now: No Electronic Signature(s) Signed: 11/24/2020 6:29:35 PM By: Levan Hurst RN, BSN Entered By: Levan Hurst on 11/24/2020 09:25:27 -------------------------------------------------------------------------------- Clinic Level of Care Assessment Details Patient Name: Date of Service: [place] 11/24/2020 9:00 [place] Record Number: [id number] Patient Account Number: [id number] Date of Birth/Sex: Treating RN: 1935-12-11 (85 y.o. [name], Lauren Primary Care Emmaleah Meroney: [name] Other Clinician: Referring Kimisha Eunice: Treating Danitza Schoenfeldt/Extender: Deloria Lair in Treatment: 7 Clinic Level of Care Assessment Items TOOL 4 Quantity Score X- 1 0 Use when  only an EandM is performed on FOLLOW-UP visit ASSESSMENTS - Nursing Assessment / Reassessment X- 1 10 Reassessment of Co-morbidities (includes updates in patient status) X- 1 5 Reassessment of Adherence to Treatment Plan ASSESSMENTS - Wound and Skin A ssessment / Reassessment X - Simple Wound Assessment / Reassessment - one wound 1 5 []  - 0 Complex Wound Assessment / Reassessment - multiple wounds X- 1 10 Dermatologic / Skin Assessment (not related to wound area) ASSESSMENTS - Focused Assessment []  - 0 Circumferential Edema Measurements - multi extremities []  - 0 Nutritional Assessment / Counseling / Intervention []  - 0 Lower Extremity Assessment (monofilament, tuning fork, pulses) []  - 0 Peripheral Arterial Disease Assessment (using hand held doppler) ASSESSMENTS - Ostomy and/or Continence Assessment and Care []  - 0 Incontinence Assessment and Management []  - 0 Ostomy Care Assessment and Management (repouching, etc.) PROCESS - Coordination of Care []  - 0 Simple Patient / Family Education for ongoing care X- 1 20 Complex (extensive) Patient / Family Education for ongoing care X- 1 10 Staff obtains Programmer, systems, Records, T Results / Process Orders est X- 1 10 Staff telephones HHA, Nursing Homes / Clarify orders / etc []  - 0 Routine Transfer to another Facility (non-emergent condition) []  - 0 Routine Hospital Admission (non-emergent condition) []  - 0 New Admissions / Biomedical engineer / Ordering NPWT Apligraf, etc. , []  - 0 Emergency Hospital Admission (emergent condition) X- 1 10 Simple Discharge Coordination []  - 0 Complex (extensive) Discharge Coordination PROCESS - Special Needs []  - 0 Pediatric / Minor Patient Management []  - 0 Isolation Patient Management []  - 0 Hearing / Language / Visual special needs []  - 0 Assessment of Community assistance (transportation, D/C planning, etc.) []  - 0 Additional assistance / Altered mentation []  - 0 Support  Surface(s) Assessment (bed, cushion, seat, etc.) INTERVENTIONS - Wound Cleansing / Measurement X - Simple Wound Cleansing - one wound 1 5 []  - 0 Complex Wound Cleansing - multiple wounds  X- 1 5 Wound Imaging (photographs - any number of wounds) []  - 0 Wound Tracing (instead of photographs) X- 1 5 Simple Wound Measurement - one wound []  - 0 Complex Wound Measurement - multiple wounds INTERVENTIONS - Wound Dressings X - Small Wound Dressing one or multiple wounds 1 10 []  - 0 Medium Wound Dressing one or multiple wounds []  - 0 Large Wound Dressing one or multiple wounds []  - 0 Application of Medications - topical []  - 0 Application of Medications - injection INTERVENTIONS - Miscellaneous []  - 0 External ear exam []  - 0 Specimen Collection (cultures, biopsies, blood, body fluids, etc.) []  - 0 Specimen(s) / Culture(s) sent or taken to Lab for analysis []  - 0 Patient Transfer (multiple staff / Civil Service fast streamer / Similar devices) []  - 0 Simple Staple / Suture removal (25 or less) []  - 0 Complex Staple / Suture removal (26 or more) []  - 0 Hypo / Hyperglycemic Management (close monitor of Blood Glucose) []  - 0 Ankle / Brachial Index (ABI) - do not check if billed separately X- 1 5 Vital Signs Has the patient been seen at the hospital within the last three years: Yes Total Score: 110 Level Of Care: New/Established - Level 3 Electronic Signature(s) Signed: 11/24/2020 5:32:12 PM By: Rhae Hammock RN Entered By: Rhae Hammock on 11/24/2020 09:42:16 -------------------------------------------------------------------------------- Encounter Discharge Information Details Patient Name: Date of Service: [name] 11/24/2020 9:00 [place] Record Number: [id number] Patient Account Number: [id number] Date of Birth/Sex: Treating RN: 09-Oct-1935 (85 y.o. [name] Primary Care Katira Dumais: [name] Other Clinician: Referring Petrina Melby: Treating Majestic Brister/Extender:  Deloria Lair in Treatment: 7 Encounter Discharge Information Items Discharge Condition: Stable Ambulatory Status: Wheelchair Discharge Destination: Home Transportation: Private Auto Accompanied By: daughter in law Schedule Follow-up Appointment: Yes Clinical Summary of Care: Provided on 11/24/2020 Form Type Recipient Paper Patient Patient Electronic Signature(s) Signed: 11/24/2020 10:52:28 AM By: Lorrin Jackson Entered By: Lorrin Jackson on 11/24/2020 10:52:27 -------------------------------------------------------------------------------- Multi Wound Chart Details Patient Name: Date of Service: [name] 11/24/2020 9:00 [place] Record Number: [id number] Patient Account Number: [id number] Date of Birth/Sex: Treating RN: 1935-09-26 (85 y.o. [name], Lauren Primary Care Shenetta Schnackenberg: [name] Other Clinician: Referring Kassidy Frankson: Treating Brandan Robicheaux/Extender: Deloria Lair in Treatment: 7 Vital Signs Height(in): 68 Capillary Blood Glucose(mg/dl): 105 Weight(lbs): 110 Pulse(bpm): 23 Body Mass Index(BMI): 17 Blood Pressure(mmHg): 161/85 Temperature(F): 97.4 Respiratory Rate(breaths/min): 16 Photos: [2:No Photos Left Gluteus] [N/A:N/A N/A] Wound Location: [2:Pressure Injury] [N/A:N/A] Wounding Event: [2:Pressure Ulcer] [N/A:N/A] Primary Etiology: [2:Anemia, Deep Vein Thrombosis,] [N/A:N/A] Comorbid History: [2:Hypertension, Type II Diabetes, Dementia 09/18/2020] [N/A:N/A] Date Acquired: [2:7] [N/A:N/A] Weeks of Treatment: [2:Open] [N/A:N/A] Wound Status: [2:7.3x5.2x3.2] [N/A:N/A] Measurements L x W x D (cm) [2:29.814] [N/A:N/A] A (cm) : rea [0:16.010] [N/A:N/A] Volume (cm) : [2:28.40%] [N/A:N/A] % Reduction in A rea: [2:-2190.10%] [N/A:N/A] % Reduction in Volume: [2:12] Starting Position 1 (o'clock): [2:5] Ending Position 1 (o'clock): [2:3.7] Maximum Distance 1 (cm): [2:Yes] [N/A:N/A] Undermining:  [2:Unstageable/Unclassified] [N/A:N/A] Classification: [2:Medium] [N/A:N/A] Exudate A mount: [2:Serosanguineous] [N/A:N/A] Exudate Type: [2:red, brown] [N/A:N/A] Exudate Color: [2:Well defined, not attached] [N/A:N/A] Wound Margin: [2:Large (67-100%)] [N/A:N/A] Granulation A mount: [2:Small (1-33%)] [N/A:N/A] Necrotic A mount: [2:Fat Layer (Subcutaneous Tissue): Yes N/A] Exposed Structures: [2:Fascia: No Tendon: No Muscle: No Joint: No Bone: No None] [N/A:N/A] Treatment Notes Electronic Signature(s) Signed: 11/24/2020 5:24:30 PM By: Linton Ham MD Signed: 11/24/2020 5:32:12 PM By: Rhae Hammock RN Entered By: Linton Ham on 11/24/2020 09:47:06 -------------------------------------------------------------------------------- Multi-Disciplinary Care  Plan Details Patient Name: Date of Service: [name], [name] 11/24/2020 9:00 [facility] Medical Record Number: [id number] Patient Account Number: [id number] Date of Birth/Sex: Treating RN: Sep 22, 1935 (85 y.o. [name], Lauren Primary Care Shakir Petrosino: [name] Other Clinician: Referring Letasha Kershaw: Treating Tamora Huneke/Extender: Deloria Lair in Treatment: 7 Active Inactive Wound/Skin Impairment Nursing Diagnoses: Impaired tissue integrity Goals: Patient/caregiver will verbalize understanding of skin care regimen Date Initiated: 10/02/2020 Date Inactivated: 11/10/2020 Target Resolution Date: 10/30/2020 Goal Status: Met Ulcer/skin breakdown will have a volume reduction of 30% by week 4 Date Initiated: 10/02/2020 Target Resolution Date: 11/24/2020 Goal Status: Active Interventions: Assess patient/caregiver ability to obtain necessary supplies Assess patient/caregiver ability to perform ulcer/skin care regimen upon admission and as needed Assess ulceration(s) every visit Provide education on ulcer and skin care Treatment Activities: Skin care regimen initiated : 10/02/2020 Topical wound management  initiated : 10/02/2020 Notes: 11/10/20 : Goal not yet met, eschar debrided off increasing wound size. Electronic Signature(s) Signed: 11/24/2020 5:32:12 PM By: Rhae Hammock RN Entered By: Rhae Hammock on 11/24/2020 09:38:09 -------------------------------------------------------------------------------- Pain Assessment Details Patient Name: Date of Service: [name], [name] 11/24/2020 9:00 A M Medical Record Number: [id number] Patient Account Number: [id number] Date of Birth/Sex: Treating RN: [birth year] (85 y.o. [name] Primary Care Rylei Masella: [name] Other Clinician: Referring Jayleah Garbers: Treating Spyros Winch/Extender: Deloria Lair in Treatment: 7 Active Problems Location of Pain Severity and Description of Pain Patient Has Paino No Site Locations Pain Management and Medication Current Pain Management: Electronic Signature(s) Signed: 11/24/2020 6:29:35 PM By: Levan Hurst RN, BSN Entered By: Levan Hurst on 11/24/2020 09:25:52 -------------------------------------------------------------------------------- Patient/Caregiver Education Details Patient Name: Date of Service: [name] 7/19/2022andnbsp9:00 [place] Record Number: [id number] Patient Account Number: [id number] Date of Birth/Gender: Treating RN: [birth year] (85 y.o. [name] Primary Care Physician: [name] Other Clinician: Referring Physician: Treating Physician/Extender: Deloria Lair in Treatment: 7 Education Assessment Education Provided To: Patient Education Topics Provided Wound/Skin Impairment: Methods: Explain/Verbal Responses: State content correctly Electronic Signature(s) Signed: 11/24/2020 5:32:12 PM By: Rhae Hammock RN Entered By: Rhae Hammock on 11/24/2020 09:38:24 -------------------------------------------------------------------------------- Wound Assessment Details Patient  Name: Date of Service: [name] 11/24/2020 9:00 [place] Record Number: [id number] Patient Account Number: [id number] Date of Birth/Sex: Treating RN: 06/29/1935 (85 y.o. [name] Primary Care Shondra Capps: [name] Other Clinician: Referring Atina Feeley: Treating Mirna Sutcliffe/Extender: Deloria Lair in Treatment: 7 Wound Status Wound Number: 2 Primary Pressure Ulcer Etiology: Wound Location: Left Gluteus Wound Status: Open Wounding Event: Pressure Injury Comorbid Anemia, Deep Vein Thrombosis, Hypertension, Type II Date Acquired: 09/18/2020 History: Diabetes, Dementia Weeks Of Treatment: 7 Clustered Wound: No Photos Wound Measurements Length: (cm) 7.3 Width: (cm) 5.2 Depth: (cm) 3.2 Area: (cm) 29.814 Volume: (cm) 95.404 % Reduction in Area: 28.4% % Reduction in Volume: -2190.1% Epithelialization: None Tunneling: No Undermining: Yes Starting Position (o'clock): 12 Ending Position (o'clock): 5 Maximum Distance: (cm) 3.7 Wound Description Classification: Unstageable/Unclassified Wound Margin: Well defined, not attached Exudate Amount: Medium Exudate Type: Serosanguineous Exudate Color: red, brown Wound Bed Granulation Amount: Large (67-100%) Necrotic Amount: Small (1-33%) Necrotic Quality: Adherent Slough Foul Odor After Cleansing: No Slough/Fibrino Yes Exposed Structure Fascia Exposed: No Fat Layer (Subcutaneous Tissue) Exposed: Yes Tendon Exposed: No Muscle Exposed: No Joint Exposed: No Bone Exposed: No Treatment Notes Wound #2 (Gluteus) Wound Laterality: Left Cleanser Soap and Water Discharge Instruction: May shower and wash wound with dial antibacterial soap and water prior to dressing change. Peri-Wound  Care Topical Primary Dressing Promogran Prisma Matrix, 4.34 (sq in) (silver collagen) Discharge Instruction: Apply moistened collagen to undermined area from 1-4 Secondary Dressing Woven Gauze Sponge,  Non-Sterile 4x4 in Discharge Instruction: Lightly pack Hydrogel moistened gauze to wound bed ABD Pad, 8x10 Discharge Instruction: Apply over primary dressing as directed. Secured With 81M Medipore H Soft Cloth Surgical T 4 x 2 (in/yd) ape Discharge Instruction: Secure dressing with tape as directed. Compression Wrap Compression Stockings Add-Ons Electronic Signature(s) Signed: 11/24/2020 5:17:35 PM By: Sandre Kitty Signed: 11/24/2020 6:29:35 PM By: Levan Hurst RN, BSN Entered By: Sandre Kitty on 11/24/2020 17:07:19 -------------------------------------------------------------------------------- Vitals Details Patient Name: Date of Service: [name] 11/24/2020 9:00 A M Medical Record Number: [id number] Patient Account Number: [id number] Date of Birth/Sex: Treating RN: 26-Feb-1936 (85 y.o. [name] Primary Care Satya Bohall: [name] Other Clinician: Referring Lunah Losasso: Treating Atreyu Mak/Extender: Deloria Lair in Treatment: 7 Vital Signs Time Taken: 09:25 Temperature (F): 97.4 Height (in): 68 Pulse (bpm): 88 Weight (lbs): 110 Respiratory Rate (breaths/min): 16 Body Mass Index (BMI): 16.7 Blood Pressure (mmHg): 161/85 Capillary Blood Glucose (mg/dl): 105 Reference Range: 80 - 120 mg / dl Notes glucose per pt report Electronic Signature(s) Signed: 11/24/2020 6:29:35 PM By: Levan Hurst RN, BSN Entered By: Levan Hurst on 11/24/2020 09:25:47

## 2020-11-27 ENCOUNTER — Other Ambulatory Visit: Payer: Self-pay | Admitting: Nurse Practitioner

## 2020-11-27 DIAGNOSIS — L89320 Pressure ulcer of left buttock, unstageable: Secondary | ICD-10-CM | POA: Diagnosis not present

## 2020-11-27 DIAGNOSIS — L89150 Pressure ulcer of sacral region, unstageable: Secondary | ICD-10-CM | POA: Diagnosis not present

## 2020-11-27 DIAGNOSIS — L03312 Cellulitis of back [any part except buttock]: Secondary | ICD-10-CM | POA: Diagnosis not present

## 2020-11-27 DIAGNOSIS — E785 Hyperlipidemia, unspecified: Secondary | ICD-10-CM | POA: Diagnosis not present

## 2020-11-27 DIAGNOSIS — I890 Lymphedema, not elsewhere classified: Secondary | ICD-10-CM | POA: Diagnosis not present

## 2020-11-27 DIAGNOSIS — F0390 Unspecified dementia without behavioral disturbance: Secondary | ICD-10-CM | POA: Diagnosis not present

## 2020-11-28 DIAGNOSIS — Z86718 Personal history of other venous thrombosis and embolism: Secondary | ICD-10-CM | POA: Diagnosis not present

## 2020-11-28 DIAGNOSIS — E119 Type 2 diabetes mellitus without complications: Secondary | ICD-10-CM | POA: Diagnosis not present

## 2020-11-28 DIAGNOSIS — I890 Lymphedema, not elsewhere classified: Secondary | ICD-10-CM | POA: Diagnosis not present

## 2020-11-28 DIAGNOSIS — I10 Essential (primary) hypertension: Secondary | ICD-10-CM | POA: Diagnosis not present

## 2020-11-28 DIAGNOSIS — L89320 Pressure ulcer of left buttock, unstageable: Secondary | ICD-10-CM | POA: Diagnosis not present

## 2020-11-28 DIAGNOSIS — E785 Hyperlipidemia, unspecified: Secondary | ICD-10-CM | POA: Diagnosis not present

## 2020-11-28 DIAGNOSIS — Z741 Need for assistance with personal care: Secondary | ICD-10-CM | POA: Diagnosis not present

## 2020-11-28 DIAGNOSIS — Z8673 Personal history of transient ischemic attack (TIA), and cerebral infarction without residual deficits: Secondary | ICD-10-CM | POA: Diagnosis not present

## 2020-11-28 DIAGNOSIS — M6281 Muscle weakness (generalized): Secondary | ICD-10-CM | POA: Diagnosis not present

## 2020-11-28 DIAGNOSIS — F0390 Unspecified dementia without behavioral disturbance: Secondary | ICD-10-CM | POA: Diagnosis not present

## 2020-11-28 DIAGNOSIS — Z86711 Personal history of pulmonary embolism: Secondary | ICD-10-CM | POA: Diagnosis not present

## 2020-11-28 DIAGNOSIS — Z7984 Long term (current) use of oral hypoglycemic drugs: Secondary | ICD-10-CM | POA: Diagnosis not present

## 2020-11-30 DIAGNOSIS — E785 Hyperlipidemia, unspecified: Secondary | ICD-10-CM | POA: Diagnosis not present

## 2020-11-30 DIAGNOSIS — I890 Lymphedema, not elsewhere classified: Secondary | ICD-10-CM | POA: Diagnosis not present

## 2020-11-30 DIAGNOSIS — E119 Type 2 diabetes mellitus without complications: Secondary | ICD-10-CM | POA: Diagnosis not present

## 2020-11-30 DIAGNOSIS — F0390 Unspecified dementia without behavioral disturbance: Secondary | ICD-10-CM | POA: Diagnosis not present

## 2020-11-30 DIAGNOSIS — Z7984 Long term (current) use of oral hypoglycemic drugs: Secondary | ICD-10-CM | POA: Diagnosis not present

## 2020-11-30 DIAGNOSIS — L89320 Pressure ulcer of left buttock, unstageable: Secondary | ICD-10-CM | POA: Diagnosis not present

## 2020-12-01 DIAGNOSIS — Z7984 Long term (current) use of oral hypoglycemic drugs: Secondary | ICD-10-CM | POA: Diagnosis not present

## 2020-12-01 DIAGNOSIS — L89320 Pressure ulcer of left buttock, unstageable: Secondary | ICD-10-CM | POA: Diagnosis not present

## 2020-12-01 DIAGNOSIS — I890 Lymphedema, not elsewhere classified: Secondary | ICD-10-CM | POA: Diagnosis not present

## 2020-12-01 DIAGNOSIS — E119 Type 2 diabetes mellitus without complications: Secondary | ICD-10-CM | POA: Diagnosis not present

## 2020-12-01 DIAGNOSIS — F0390 Unspecified dementia without behavioral disturbance: Secondary | ICD-10-CM | POA: Diagnosis not present

## 2020-12-01 DIAGNOSIS — E785 Hyperlipidemia, unspecified: Secondary | ICD-10-CM | POA: Diagnosis not present

## 2020-12-02 DIAGNOSIS — I890 Lymphedema, not elsewhere classified: Secondary | ICD-10-CM | POA: Diagnosis not present

## 2020-12-02 DIAGNOSIS — Z7984 Long term (current) use of oral hypoglycemic drugs: Secondary | ICD-10-CM | POA: Diagnosis not present

## 2020-12-02 DIAGNOSIS — F0390 Unspecified dementia without behavioral disturbance: Secondary | ICD-10-CM | POA: Diagnosis not present

## 2020-12-02 DIAGNOSIS — E785 Hyperlipidemia, unspecified: Secondary | ICD-10-CM | POA: Diagnosis not present

## 2020-12-02 DIAGNOSIS — E119 Type 2 diabetes mellitus without complications: Secondary | ICD-10-CM | POA: Diagnosis not present

## 2020-12-02 DIAGNOSIS — L89320 Pressure ulcer of left buttock, unstageable: Secondary | ICD-10-CM | POA: Diagnosis not present

## 2020-12-04 DIAGNOSIS — F0390 Unspecified dementia without behavioral disturbance: Secondary | ICD-10-CM | POA: Diagnosis not present

## 2020-12-04 DIAGNOSIS — E785 Hyperlipidemia, unspecified: Secondary | ICD-10-CM | POA: Diagnosis not present

## 2020-12-04 DIAGNOSIS — E119 Type 2 diabetes mellitus without complications: Secondary | ICD-10-CM | POA: Diagnosis not present

## 2020-12-04 DIAGNOSIS — Z7984 Long term (current) use of oral hypoglycemic drugs: Secondary | ICD-10-CM | POA: Diagnosis not present

## 2020-12-04 DIAGNOSIS — L89320 Pressure ulcer of left buttock, unstageable: Secondary | ICD-10-CM | POA: Diagnosis not present

## 2020-12-04 DIAGNOSIS — I890 Lymphedema, not elsewhere classified: Secondary | ICD-10-CM | POA: Diagnosis not present

## 2020-12-07 DIAGNOSIS — Z7984 Long term (current) use of oral hypoglycemic drugs: Secondary | ICD-10-CM | POA: Diagnosis not present

## 2020-12-07 DIAGNOSIS — L89320 Pressure ulcer of left buttock, unstageable: Secondary | ICD-10-CM | POA: Diagnosis not present

## 2020-12-07 DIAGNOSIS — E785 Hyperlipidemia, unspecified: Secondary | ICD-10-CM | POA: Diagnosis not present

## 2020-12-07 DIAGNOSIS — I890 Lymphedema, not elsewhere classified: Secondary | ICD-10-CM | POA: Diagnosis not present

## 2020-12-07 DIAGNOSIS — E119 Type 2 diabetes mellitus without complications: Secondary | ICD-10-CM | POA: Diagnosis not present

## 2020-12-07 DIAGNOSIS — F0390 Unspecified dementia without behavioral disturbance: Secondary | ICD-10-CM | POA: Diagnosis not present

## 2020-12-08 ENCOUNTER — Encounter (HOSPITAL_BASED_OUTPATIENT_CLINIC_OR_DEPARTMENT_OTHER): Payer: Medicare Other | Attending: Internal Medicine | Admitting: Internal Medicine

## 2020-12-08 ENCOUNTER — Other Ambulatory Visit: Payer: Self-pay

## 2020-12-08 DIAGNOSIS — I10 Essential (primary) hypertension: Secondary | ICD-10-CM | POA: Diagnosis not present

## 2020-12-08 DIAGNOSIS — E11622 Type 2 diabetes mellitus with other skin ulcer: Secondary | ICD-10-CM | POA: Insufficient documentation

## 2020-12-08 DIAGNOSIS — Z86718 Personal history of other venous thrombosis and embolism: Secondary | ICD-10-CM | POA: Insufficient documentation

## 2020-12-08 DIAGNOSIS — G309 Alzheimer's disease, unspecified: Secondary | ICD-10-CM | POA: Diagnosis not present

## 2020-12-08 DIAGNOSIS — L89324 Pressure ulcer of left buttock, stage 4: Secondary | ICD-10-CM | POA: Diagnosis not present

## 2020-12-08 DIAGNOSIS — F0280 Dementia in other diseases classified elsewhere without behavioral disturbance: Secondary | ICD-10-CM | POA: Diagnosis not present

## 2020-12-08 DIAGNOSIS — L8993 Pressure ulcer of unspecified site, stage 3: Secondary | ICD-10-CM | POA: Diagnosis not present

## 2020-12-09 DIAGNOSIS — F0390 Unspecified dementia without behavioral disturbance: Secondary | ICD-10-CM | POA: Diagnosis not present

## 2020-12-09 DIAGNOSIS — L89320 Pressure ulcer of left buttock, unstageable: Secondary | ICD-10-CM | POA: Diagnosis not present

## 2020-12-09 DIAGNOSIS — I890 Lymphedema, not elsewhere classified: Secondary | ICD-10-CM | POA: Diagnosis not present

## 2020-12-09 DIAGNOSIS — E785 Hyperlipidemia, unspecified: Secondary | ICD-10-CM | POA: Diagnosis not present

## 2020-12-09 DIAGNOSIS — Z7984 Long term (current) use of oral hypoglycemic drugs: Secondary | ICD-10-CM | POA: Diagnosis not present

## 2020-12-09 DIAGNOSIS — E119 Type 2 diabetes mellitus without complications: Secondary | ICD-10-CM | POA: Diagnosis not present

## 2020-12-11 DIAGNOSIS — I890 Lymphedema, not elsewhere classified: Secondary | ICD-10-CM | POA: Diagnosis not present

## 2020-12-11 DIAGNOSIS — Z7984 Long term (current) use of oral hypoglycemic drugs: Secondary | ICD-10-CM | POA: Diagnosis not present

## 2020-12-11 DIAGNOSIS — E119 Type 2 diabetes mellitus without complications: Secondary | ICD-10-CM | POA: Diagnosis not present

## 2020-12-11 DIAGNOSIS — L89320 Pressure ulcer of left buttock, unstageable: Secondary | ICD-10-CM | POA: Diagnosis not present

## 2020-12-11 DIAGNOSIS — F0390 Unspecified dementia without behavioral disturbance: Secondary | ICD-10-CM | POA: Diagnosis not present

## 2020-12-11 DIAGNOSIS — E785 Hyperlipidemia, unspecified: Secondary | ICD-10-CM | POA: Diagnosis not present

## 2020-12-14 DIAGNOSIS — Z7984 Long term (current) use of oral hypoglycemic drugs: Secondary | ICD-10-CM | POA: Diagnosis not present

## 2020-12-14 DIAGNOSIS — L89320 Pressure ulcer of left buttock, unstageable: Secondary | ICD-10-CM | POA: Diagnosis not present

## 2020-12-14 DIAGNOSIS — E119 Type 2 diabetes mellitus without complications: Secondary | ICD-10-CM | POA: Diagnosis not present

## 2020-12-14 DIAGNOSIS — F0390 Unspecified dementia without behavioral disturbance: Secondary | ICD-10-CM | POA: Diagnosis not present

## 2020-12-14 DIAGNOSIS — I890 Lymphedema, not elsewhere classified: Secondary | ICD-10-CM | POA: Diagnosis not present

## 2020-12-14 DIAGNOSIS — E785 Hyperlipidemia, unspecified: Secondary | ICD-10-CM | POA: Diagnosis not present

## 2020-12-16 DIAGNOSIS — E119 Type 2 diabetes mellitus without complications: Secondary | ICD-10-CM | POA: Diagnosis not present

## 2020-12-16 DIAGNOSIS — F0390 Unspecified dementia without behavioral disturbance: Secondary | ICD-10-CM | POA: Diagnosis not present

## 2020-12-16 DIAGNOSIS — L89320 Pressure ulcer of left buttock, unstageable: Secondary | ICD-10-CM | POA: Diagnosis not present

## 2020-12-16 DIAGNOSIS — I890 Lymphedema, not elsewhere classified: Secondary | ICD-10-CM | POA: Diagnosis not present

## 2020-12-16 DIAGNOSIS — E785 Hyperlipidemia, unspecified: Secondary | ICD-10-CM | POA: Diagnosis not present

## 2020-12-16 DIAGNOSIS — Z7984 Long term (current) use of oral hypoglycemic drugs: Secondary | ICD-10-CM | POA: Diagnosis not present

## 2020-12-18 DIAGNOSIS — E119 Type 2 diabetes mellitus without complications: Secondary | ICD-10-CM | POA: Diagnosis not present

## 2020-12-18 DIAGNOSIS — Z7984 Long term (current) use of oral hypoglycemic drugs: Secondary | ICD-10-CM | POA: Diagnosis not present

## 2020-12-18 DIAGNOSIS — I890 Lymphedema, not elsewhere classified: Secondary | ICD-10-CM | POA: Diagnosis not present

## 2020-12-18 DIAGNOSIS — F0390 Unspecified dementia without behavioral disturbance: Secondary | ICD-10-CM | POA: Diagnosis not present

## 2020-12-18 DIAGNOSIS — L89320 Pressure ulcer of left buttock, unstageable: Secondary | ICD-10-CM | POA: Diagnosis not present

## 2020-12-18 DIAGNOSIS — E785 Hyperlipidemia, unspecified: Secondary | ICD-10-CM | POA: Diagnosis not present

## 2020-12-21 DIAGNOSIS — Z7984 Long term (current) use of oral hypoglycemic drugs: Secondary | ICD-10-CM | POA: Diagnosis not present

## 2020-12-21 DIAGNOSIS — I890 Lymphedema, not elsewhere classified: Secondary | ICD-10-CM | POA: Diagnosis not present

## 2020-12-21 DIAGNOSIS — E785 Hyperlipidemia, unspecified: Secondary | ICD-10-CM | POA: Diagnosis not present

## 2020-12-21 DIAGNOSIS — E119 Type 2 diabetes mellitus without complications: Secondary | ICD-10-CM | POA: Diagnosis not present

## 2020-12-21 DIAGNOSIS — L89320 Pressure ulcer of left buttock, unstageable: Secondary | ICD-10-CM | POA: Diagnosis not present

## 2020-12-21 DIAGNOSIS — F0390 Unspecified dementia without behavioral disturbance: Secondary | ICD-10-CM | POA: Diagnosis not present

## 2020-12-23 ENCOUNTER — Other Ambulatory Visit: Payer: Self-pay

## 2020-12-23 ENCOUNTER — Encounter (HOSPITAL_BASED_OUTPATIENT_CLINIC_OR_DEPARTMENT_OTHER): Payer: Medicare Other | Admitting: Internal Medicine

## 2020-12-23 DIAGNOSIS — I890 Lymphedema, not elsewhere classified: Secondary | ICD-10-CM | POA: Diagnosis not present

## 2020-12-23 DIAGNOSIS — E11622 Type 2 diabetes mellitus with other skin ulcer: Secondary | ICD-10-CM | POA: Diagnosis not present

## 2020-12-23 DIAGNOSIS — L89324 Pressure ulcer of left buttock, stage 4: Secondary | ICD-10-CM | POA: Diagnosis not present

## 2020-12-23 DIAGNOSIS — L89320 Pressure ulcer of left buttock, unstageable: Secondary | ICD-10-CM | POA: Diagnosis not present

## 2020-12-23 DIAGNOSIS — E119 Type 2 diabetes mellitus without complications: Secondary | ICD-10-CM | POA: Diagnosis not present

## 2020-12-23 DIAGNOSIS — L89893 Pressure ulcer of other site, stage 3: Secondary | ICD-10-CM | POA: Diagnosis not present

## 2020-12-23 DIAGNOSIS — L8993 Pressure ulcer of unspecified site, stage 3: Secondary | ICD-10-CM | POA: Diagnosis not present

## 2020-12-23 DIAGNOSIS — E785 Hyperlipidemia, unspecified: Secondary | ICD-10-CM | POA: Diagnosis not present

## 2020-12-23 DIAGNOSIS — I10 Essential (primary) hypertension: Secondary | ICD-10-CM | POA: Diagnosis not present

## 2020-12-23 DIAGNOSIS — F0390 Unspecified dementia without behavioral disturbance: Secondary | ICD-10-CM | POA: Diagnosis not present

## 2020-12-23 DIAGNOSIS — Z7984 Long term (current) use of oral hypoglycemic drugs: Secondary | ICD-10-CM | POA: Diagnosis not present

## 2020-12-23 DIAGNOSIS — G309 Alzheimer's disease, unspecified: Secondary | ICD-10-CM | POA: Diagnosis not present

## 2020-12-23 DIAGNOSIS — F0280 Dementia in other diseases classified elsewhere without behavioral disturbance: Secondary | ICD-10-CM | POA: Diagnosis not present

## 2020-12-23 NOTE — Progress Notes (Signed)
[name] ([id number]) , Visit Report for 12/23/2020 Arrival Information Details Patient Name: Date of Service: Carolyn, Contreras 12/23/2020 9:15 A M Medical Record Number: [id number] Patient Account Number: [id number] Date of Birth/Sex: Treating RN: 04/08/1936 (85 y.o. [name] Primary Care Oneita Allmon: [name] Other Clinician: Referring Zalika Tieszen: Treating Creek Gan/Extender: Deloria Lair in Treatment: 11 Visit Information History Since Last Visit Added or deleted any medications: No Patient Arrived: Wheel Chair Any new allergies or adverse reactions: No Arrival Time: 09:14 Had a fall or experienced change in No Accompanied By: daughter in law activities of daily living that may affect Transfer Assistance: Manual risk of falls: Patient Identification Verified: Yes Signs or symptoms of abuse/neglect since last visito No Secondary Verification Process Completed: Yes Hospitalized since last visit: No Patient Requires Transmission-Based Precautions: No Implantable device outside of the clinic excluding No Patient Has Alerts: No cellular tissue based products placed in the center since last visit: Has Dressing in Place as Prescribed: Yes Pain Present Now: No Electronic Signature(s) Signed: 12/23/2020 6:24:39 PM By: Lorrin Jackson Entered By: Lorrin Jackson on 12/23/2020 09:20:20 -------------------------------------------------------------------------------- Encounter Discharge Information Details Patient Name: Date of Service: [name] 12/23/2020 9:15 A M Medical Record Number: [id number] Patient Account Number: [id number] Date of Birth/Sex: Treating RN: 03/25/1936 (85 y.o. [name] Primary Care Venise Ellingwood: [name] Other Clinician: Referring Girl Schissler: Treating Cayne Yom/Extender: Deloria Lair in Treatment: 11 Encounter Discharge Information Items Post Procedure Vitals Discharge Condition:  Stable Temperature (F): 97.8 Ambulatory Status: Wheelchair Pulse (bpm): 78 Discharge Destination: Home Respiratory Rate (breaths/min): 18 Transportation: Private Auto Blood Pressure (mmHg): 137/80 Accompanied By: [name] Schedule Follow-up Appointment: Yes Clinical Summary of Care: Provided on 12/23/2020 Form Type Recipient Paper Patient Patient/Caregiver Electronic Signature(s) Signed: 12/23/2020 6:24:39 PM By: Lorrin Jackson Entered By: Lorrin Jackson on 12/23/2020 10:12:03 -------------------------------------------------------------------------------- Lower Extremity Assessment Details Patient Name: Date of Service: Carolyn, Contreras 12/23/2020 9:15 A M Medical Record Number: [id number] Patient Account Number: [id number] Date of Birth/Sex: Treating RN: [birth year] (85 y.o. [name] Primary Care Maurice Fotheringham: [name] Other Clinician: Referring Sharifah Champine: Treating Chetara Kropp/Extender: Deloria Lair in Treatment: 11 Electronic Signature(s) Signed: 12/23/2020 6:24:39 PM By: Lorrin Jackson Entered By: Lorrin Jackson on 12/23/2020 09:29:47 -------------------------------------------------------------------------------- Multi Wound Chart Details Patient Name: Date of Service: Carolyn, Contreras 12/23/2020 9:15 A M Medical Record Number: [id number] Patient Account Number: [id number] Date of Birth/Sex: Treating RN: [birth year] (85 y.o. [name] Primary Care Natalynn Pedone: [name] Other Clinician: Referring Daylin Eads: Treating Zamzam Whinery/Extender: Deloria Lair in Treatment: 11 Vital Signs Height(in): 75 Capillary Blood Glucose(mg/dl): 110 Weight(lbs): 110 Pulse(bpm): 25 Body Mass Index(BMI): 34 Blood Pressure(mmHg): 137/80 Temperature(F): 97.8 Respiratory Rate(breaths/min): 18 Photos: [N/A:N/A] Left Gluteus Right, Lateral Lower Leg N/A Wound Location: Pressure Injury Pressure Injury N/A Wounding  Event: Pressure Ulcer Pressure Ulcer N/A Primary Etiology: Anemia, Deep Vein Thrombosis, Anemia, Deep Vein Thrombosis, N/A Comorbid History: Hypertension, Type II Diabetes, Hypertension, Type II Diabetes, Dementia Dementia 09/18/2020 12/16/2020 N/A Date Acquired: 11 0 N/A Weeks of Treatment: Open Open N/A Wound Status: 6.2x5x1.4 1.7x1.5x0.3 N/A Measurements L x W x D (cm) 24.347 2.003 N/A A (cm) : rea 34.086 0.601 N/A Volume (cm) : 41.60% 0.00% N/A % Reduction in A rea: -718.20% 0.00% N/A % Reduction in Volume: 10 Starting Position 1 (o'clock): 5 Ending Position 1 (o'clock): 4.6 Maximum Distance 1 (cm): Yes No N/A Undermining: Category/Stage III Category/Stage III N/A Classification: Medium Medium N/A Exudate A mount:  Serosanguineous Serosanguineous N/A Exudate Type: red, brown red, brown N/A Exudate Color: Thickened Well defined, not attached N/A Wound Margin: Large (67-100%) Large (67-100%) N/A Granulation Amount: Red Red N/A Granulation Quality: None Present (0%) Small (1-33%) N/A Necrotic Amount: N/A Eschar, Adherent Slough N/A Necrotic Tissue: Fat Layer (Subcutaneous Tissue): Yes Fat Layer (Subcutaneous Tissue): Yes N/A Exposed Structures: Fascia: No Fascia: No Tendon: No Tendon: No Muscle: No Muscle: No Joint: No Joint: No Bone: No Bone: No None None N/A Epithelialization: N/A Debridement - Selective/Open Wound N/A Debridement: Pre-procedure Verification/Time Out N/A 09:47 N/A Taken: N/A Other N/A Pain Control: N/A Necrotic/Eschar, Slough N/A Tissue Debrided: N/A Non-Viable Tissue N/A Level: N/A 2.55 N/A Debridement A (sq cm): rea N/A Curette N/A Instrument: N/A Minimum N/A Bleeding: N/A Pressure N/A Hemostasis A chieved: N/A Procedure was tolerated well N/A Debridement Treatment Response: N/A 1.7x1.5x0.3 N/A Post Debridement Measurements L x W x D (cm) N/A 0.601 N/A Post Debridement Volume: (cm) N/A Category/Stage III  N/A Post Debridement Stage: N/A Debridement N/A Procedures Performed: Treatment Notes Electronic Signature(s) Signed: 12/23/2020 5:36:21 PM By: Linton Ham MD Signed: 12/23/2020 6:11:36 PM By: Levan Hurst RN, BSN Entered By: Linton Ham on 12/23/2020 10:09:41 -------------------------------------------------------------------------------- Multi-Disciplinary Care Plan Details Patient Name: Date of Service: [name] 12/23/2020 9:15 A M Medical Record Number: [id number] Patient Account Number: [id number] Date of Birth/Sex: Treating RN: November 25, 1935 (85 y.o. [name] Primary Care Westyn Driggers: [name] Other Clinician: Referring Terica Yogi: Treating Jhordyn Hoopingarner/Extender: Deloria Lair in Treatment: 11 Active Inactive Wound/Skin Impairment Nursing Diagnoses: Impaired tissue integrity Goals: Patient/caregiver will verbalize understanding of skin care regimen Date Initiated: 10/02/2020 Date Inactivated: 11/10/2020 Target Resolution Date: 10/30/2020 Goal Status: Met Ulcer/skin breakdown will have a volume reduction of 30% by week 4 Date Initiated: 10/02/2020 Target Resolution Date: 01/09/2021 Goal Status: Active Interventions: Assess patient/caregiver ability to obtain necessary supplies Assess patient/caregiver ability to perform ulcer/skin care regimen upon admission and as needed Assess ulceration(s) every visit Provide education on ulcer and skin care Treatment Activities: Skin care regimen initiated : 10/02/2020 Topical wound management initiated : 10/02/2020 Notes: 11/10/20 : Goal not yet met, eschar debrided off increasing wound size. Electronic Signature(s) Signed: 12/23/2020 6:24:39 PM By: Lorrin Jackson Entered By: Lorrin Jackson on 12/23/2020 09:42:21 -------------------------------------------------------------------------------- Pain Assessment Details Patient Name: Date of Service: Carolyn, Contreras 12/23/2020 9:15 A M Medical  Record Number: [id number] Patient Account Number: [id number] Date of Birth/Sex: Treating RN: 02-Mar-1936 (85 y.o. [name] Primary Care Gilles Trimpe: [name] Other Clinician: Referring Amad Mau: Treating Laiah Pouncey/Extender: Deloria Lair in Treatment: 11 Active Problems Location of Pain Severity and Description of Pain Patient Has Paino No Site Locations Pain Management and Medication Current Pain Management: Electronic Signature(s) Signed: 12/23/2020 6:24:39 PM By: Lorrin Jackson Entered By: Lorrin Jackson on 12/23/2020 09:29:39 -------------------------------------------------------------------------------- Patient/Caregiver Education Details Patient Name: Date of Service: [name] 8/17/2022andnbsp9:15 A M Medical Record Number: [id number] Patient Account Number: [id number] Date of Birth/Gender: Treating RN: 08/03/1935 (85 y.o. [name] Primary Care Physician: [name] Other Clinician: Referring Physician: Treating Physician/Extender: Deloria Lair in Treatment: 11 Education Assessment Education Provided To: Caregiver Education Topics Provided Pressure: Methods: Explain/Verbal, Printed Responses: State content correctly Wound/Skin Impairment: Methods: Explain/Verbal, Printed Responses: State content correctly Electronic Signature(s) Signed: 12/23/2020 6:24:39 PM By: Lorrin Jackson Entered By: Lorrin Jackson on 12/23/2020 09:42:46 -------------------------------------------------------------------------------- Wound Assessment Details Patient Name: Date of Service: Carolyn, Contreras 12/23/2020 9:15 A M Medical Record Number: [id number] Patient Account Number:  [id number] Date of Birth/Sex: Treating RN: 07-20-1935 (85 y.o. [name] Primary Care Sarin Comunale: [name] Other Clinician: Referring Stanely Sexson: Treating Estes Lehner/Extender: Deloria Lair in  Treatment: 11 Wound Status Wound Number: 2 Primary Pressure Ulcer Etiology: Wound Location: Left Gluteus Wound Status: Open Wounding Event: Pressure Injury Comorbid Anemia, Deep Vein Thrombosis, Hypertension, Type II Date Acquired: 09/18/2020 History: Diabetes, Dementia Weeks Of Treatment: 11 Clustered Wound: No Photos Wound Measurements Length: (cm) 6.2 Width: (cm) 5 Depth: (cm) 1.4 Area: (cm) 24.347 Volume: (cm) 34.086 % Reduction in Area: 41.6% % Reduction in Volume: -718.2% Epithelialization: None Tunneling: No Undermining: Yes Starting Position (o'clock): 10 Ending Position (o'clock): 5 Maximum Distance: (cm) 4.6 Wound Description Classification: Category/Stage III Wound Margin: Thickened Exudate Amount: Medium Exudate Type: Serosanguineous Exudate Color: red, brown Foul Odor After Cleansing: No Slough/Fibrino Yes Wound Bed Granulation Amount: Large (67-100%) Exposed Structure Granulation Quality: Red Fascia Exposed: No Necrotic Amount: None Present (0%) Fat Layer (Subcutaneous Tissue) Exposed: Yes Tendon Exposed: No Muscle Exposed: No Joint Exposed: No Bone Exposed: No Treatment Notes Wound #2 (Gluteus) Wound Laterality: Left Cleanser Soap and Water Discharge Instruction: May shower and wash wound with dial antibacterial soap and water prior to dressing change. Peri-Wound Care Topical Primary Dressing Promogran Prisma Matrix, 4.34 (sq in) (silver collagen) Discharge Instruction: Apply moistened collagen to undermined area from 1-4 Secondary Dressing Woven Gauze Sponge, Non-Sterile 4x4 in Discharge Instruction: Lightly pack Hydrogel moistened gauze to wound bed in clinic. Home health to apply wound vac. ABD Pad, 8x10 Discharge Instruction: Apply over primary dressing as directed. Secured With 28M Medipore H Soft Cloth Surgical T 4 x 2 (in/yd) ape Discharge Instruction: Secure dressing with tape as directed. Compression Wrap Compression  Stockings Add-Ons Electronic Signature(s) Signed: 12/23/2020 6:24:39 PM By: Lorrin Jackson Entered By: Lorrin Jackson on 12/23/2020 09:48:57 -------------------------------------------------------------------------------- Wound Assessment Details Patient Name: Date of Service: Carolyn, Contreras 12/23/2020 9:15 A M Medical Record Number: [id number] Patient Account Number: [id number] Date of Birth/Sex: Treating RN: 1935-07-04 (85 y.o. [name] Primary Care Damarie Schoolfield: [name] Other Clinician: Referring Leshae Mcclay: Treating Colvin Blatt/Extender: Deloria Lair in Treatment: 11 Wound Status Wound Number: 3 Primary Pressure Ulcer Etiology: Wound Location: Right, Lateral Lower Leg Wound Status: Open Wounding Event: Pressure Injury Comorbid Anemia, Deep Vein Thrombosis, Hypertension, Type II Date Acquired: 12/16/2020 History: Diabetes, Dementia Weeks Of Treatment: 0 Clustered Wound: No Photos Wound Measurements Length: (cm) 1.7 Width: (cm) 1.5 Depth: (cm) 0.3 Area: (cm) 2.003 Volume: (cm) 0.601 % Reduction in Area: 0% % Reduction in Volume: 0% Epithelialization: None Tunneling: No Undermining: No Wound Description Classification: Category/Stage III Wound Margin: Well defined, not attached Exudate Amount: Medium Exudate Type: Serosanguineous Exudate Color: red, brown Foul Odor After Cleansing: No Slough/Fibrino Yes Wound Bed Granulation Amount: Large (67-100%) Exposed Structure Granulation Quality: Red Fascia Exposed: No Necrotic Amount: Small (1-33%) Fat Layer (Subcutaneous Tissue) Exposed: Yes Necrotic Quality: Eschar, Adherent Slough Tendon Exposed: No Muscle Exposed: No Joint Exposed: No Bone Exposed: No Treatment Notes Wound #3 (Lower Leg) Wound Laterality: Right, Lateral Cleanser Soap and Water Discharge Instruction: May shower and wash wound with dial antibacterial soap and water prior to dressing change. Wound  Cleanser Discharge Instruction: Cleanse the wound with wound cleanser prior to applying a clean dressing using gauze sponges, not tissue or cotton balls. Peri-Wound Care Topical Primary Dressing KerraCel Ag Gelling Fiber Dressing, 2x2 in (silver alginate) Discharge Instruction: Apply silver alginate to wound bed as instructed Secondary Dressing Zetuvit Plus Silicone Border  Dressing 4x4 (in/in) Discharge Instruction: Apply silicone border over primary dressing as directed. Secured With Compression Wrap Compression Stockings Environmental education officer) Signed: 12/23/2020 6:24:39 PM By: Lorrin Jackson Entered By: Lorrin Jackson on 12/23/2020 09:32:20 -------------------------------------------------------------------------------- Vitals Details Patient Name: Date of Service: [name] 12/23/2020 9:15 A M Medical Record Number: [id number] Patient Account Number: [id number] Date of Birth/Sex: Treating RN: 03-05-1936 (85 y.o. [name] Primary Care Zaccary Creech: [name] Other Clinician: Referring Taetum Flewellen: Treating Ursula Dermody/Extender: Deloria Lair in Treatment: 11 Vital Signs Time Taken: 09:25 Temperature (F): 97.8 Height (in): 68 Pulse (bpm): 78 Weight (lbs): 110 Respiratory Rate (breaths/min): 18 Body Mass Index (BMI): 16.7 Blood Pressure (mmHg): 137/80 Capillary Blood Glucose (mg/dl): 110 Reference Range: 80 - 120 mg / dl Electronic Signature(s) Signed: 12/23/2020 6:24:39 PM By: Lorrin Jackson Entered By: Lorrin Jackson on 12/23/2020 83:25:49

## 2020-12-23 NOTE — Progress Notes (Signed)
KIERNEY ORECCHIO ([id number]) , Visit Report for 12/23/2020 Debridement Details Patient Name: Date of Service: ADALAYA, BYRNES 12/23/2020 9:15 A M Medical Record Number: [id number] Patient Account Number: [id number] Date of Birth/Sex: Treating RN: 1936-03-12 (85 y.o. [name] Primary Care Provider: [name] Other Clinician: Referring Provider: Treating Provider/Extender: Deloria Lair in Treatment: 11 Debridement Performed for Assessment: Wound #3 Right,Lateral Lower Leg Performed By: Physician Ricard Dillon., MD Debridement Type: Debridement Level of Consciousness (Pre-procedure): Awake and Alert Pre-procedure Verification/Time Out Yes - 09:47 Taken: Start Time: 09:48 Pain Control: Other : Benzocaine T Area Debrided (L x W): otal 1.7 (cm) x 1.5 (cm) = 2.55 (cm) Tissue and other material debrided: Non-Viable, Eschar, Slough, Subcutaneous, Slough Level: Skin/Subcutaneous Tissue Debridement Description: Excisional Instrument: Curette Bleeding: Minimum Hemostasis Achieved: Pressure End Time: 09:52 Response to Treatment: Procedure was tolerated well Level of Consciousness (Post- Awake and Alert procedure): Post Debridement Measurements of Total Wound Length: (cm) 1.7 Stage: Category/Stage III Width: (cm) 1.5 Depth: (cm) 0.3 Volume: (cm) 0.601 Character of Wound/Ulcer Post Debridement: Stable Post Procedure Diagnosis Same as Pre-procedure Electronic Signature(s) Signed: 12/23/2020 5:36:21 PM By: Linton Ham MD Signed: 12/23/2020 6:11:36 PM By: Levan Hurst RN, BSN Entered By: Linton Ham on 12/23/2020 10:18:27 -------------------------------------------------------------------------------- HPI Details Patient Name: Date of Service: [name] 12/23/2020 9:15 A M Medical Record Number: [id number] Patient Account Number: [id number] Date of Birth/Sex: Treating RN: 05/17/1935 (85 y.o. [name] Primary Care  Provider: [name] Other Clinician: Referring Provider: Treating Provider/Extender: Deloria Lair in Treatment: 11 History of Present Illness HPI Description: Admission 5/27 Ms. Leileen Pande is an 85 year old female with a past medical history of type 2 diabetes on oral agents, hypertension and dementia that presents today to the clinic for a wound to her right buttocks. Her daughter-in-law is present today and helps provide the history. This issue has been going on for 2 to 3 weeks now. She states she developed an eschar about 1 week ago. She has been taking doxycycline for possible soft tissue infection to the area prescribed by her primary care provider. Patient currently denies any signs of infection. 6/3; patient admitted to the clinic last week. She has a large necrotic area on her left buttock. They use Santyl last week. On arrival this visit she had completely necrotic surface with open to subcutaneous tissue. The surface was completely nonviable. They have been using Santyl. She apparently had been on doxycycline which she is just finishing prescribed by her primary doctor for possible soft tissue infection 6/7; patient with a large necrotic wound over her left buttock. Aggressive debridement last week. Culture of this grew Proteus unfortunately would not of been covered well or at least predictably by the doxycycline that her primary doctor put her on. I have therefore put her on Augmentin suspension 3 times a day. We are using silver alginate as the primary dressing while we deal with the infection. Her daughter has a list of concerns including swallowing difficulties, concerns about aspiration. The patient has advanced dementia. If this is Alzheimer's disease it is at its preterminal stages. I went over that swallowing difficulties are part of what happens in the preterminal stages of Alzheimer's disease. Nevertheless we will family members seems to  want to pursue a very aggressive course 6/21 large wound over her left buttock. She has completed the antibiotics I gave her [Augmentin]. We are using silver alginate while we are dealing with the infection and also to  help with the drainage In general the wound looks better she has healthy granulation over perhaps 70% of it. Superiorly there is still necrotic debris I did not attempt to debride this today The patient has advanced dementia. I do not know that she could tolerate a wound VAC. She also has double incontinence which would make that difficult. Nevertheless she has been cared for diligently by her family. She is apparently eating well and may be 2 to 3 hours on this area all day 7/5; 2-week follow-up. She continues with a nice improvement in overall condition of the wound bed. The undermining area has still some adherent surface slough. We have been using silver alginate because of the drainage and possibility of at least superficial wound infection/colonization. Although I said in previous notes I wondered whether she could tolerate the wound VAC they have done so well with this wound at home I do not want to necessarily rule her out for a wound VAC and I am going to direct the start of a trial of wound VAC therapy if this can be covered by insurance, if we have home health support to change it at all 7/19; we did not get very far with the wound VAC because my original ICD 10 said this was unstageable. As usual this represents a considerable frustration because we would not of ordered a wound VAC if the wound was still unstageable. This currently represents a stage IV staging. This is all the way down through muscle layer. There is a rim of tissue over the bone and no palpable exposed bone but this is a deep wound. Fortunately at present no evidence of infection. We have been using silver collagen with backing wet-to-dry. The wound is really no better today but no worse 8/2; patient  presents for 2-week follow-up. She has been using the wound VAC for the past week. There has been some issues with keeping the drape in place however home health comes out to reapply the drape when this happens. Overall there are no issues or complaints today. No signs of infection. 8/17; patient has been using the wound VAC on her wound on the upper left buttock. About 50 to 60% of this area is fully granulated however from about 10-5 o'clock there is still undermining. UNFORTUNATELY she also arrives in clinic today with a wound over her right fibular head. According to her daughter has been there for about 2 weeks. She wonders whether there is an abrasion although no concrete history of this. Given her frail status this is probably most likely a pressure ulcer over a bony prominence [fibular head] Electronic Signature(s) Signed: 12/23/2020 5:36:21 PM By: Linton Ham MD Entered By: Linton Ham on 12/23/2020 10:12:21 -------------------------------------------------------------------------------- Physical Exam Details Patient Name: Date of Service: LAURALYE, STAPF 12/23/2020 9:15 A M Medical Record Number: [id number] Patient Account Number: [id number] Date of Birth/Sex: Treating RN: 20-Oct-1935 (85 y.o. [name] Primary Care Provider: [name] Other Clinician: Referring Provider: Treating Provider/Extender: Deloria Lair in Treatment: 11 Constitutional Sitting or standing Blood Pressure is within target range for patient.. Pulse regular and within target range for patient.Marland Kitchen Respirations regular, non-labored and within target range.. Temperature is normal and within the target range for the patient.Marland Kitchen Appears in no distress. Notes Wound exam; about 60% of the surface area of this wound is well granulated. There is no evidence of surrounding infection there is undermining but no exposed bone. No erythema around the wound. No crepitus no  tenderness. She has a new area today over the right fibular head. Nonviable surface. I used a #5 curette removing slough, nonviable subcutaneous tissue. Hemostasis with direct pressure. This does not probe to bone and again has no evidence of surrounding infection Electronic Signature(s) Signed: 12/23/2020 5:36:21 PM By: Linton Ham MD Entered By: Linton Ham on 12/23/2020 10:15:39 -------------------------------------------------------------------------------- Physician Orders Details Patient Name: Date of Service: LOUWANA, GILGEN 12/23/2020 9:15 A M Medical Record Number: [id number] Patient Account Number: [id number] Date of Birth/Sex: Treating RN: 1935-05-19 (85 y.o. [name] Primary Care Provider: [name] Other Clinician: Referring Provider: Treating Provider/Extender: Deloria Lair in Treatment: 11 Verbal / Phone Orders: No Diagnosis Coding Follow-up Appointments ppointment in 2 weeks. - on Wednesday Return A Negative Presssure Wound Therapy Wound #2 Left Gluteus Wound Vac to wound continuously at 137m/hg pressure Black and White Foam combination - White foam to undermining 10:00-5:00 and black foam to wound bed. Bridge with black foam to patient's side to relieve pressure point. Off-Loading Low air-loss mattress (Group 2) Turn and reposition every 2 hours Additional Orders / Instructions Follow Nutritious Diet - 100-120g of Protein Home Health New wound care orders this week; continue Home Health for wound care. May utilize formulary equivalent dressing for wound treatment orders unless otherwise specified. - New wound right leg [facility] Monday's, Wednseday's, and Friday's Wound Treatment Wound #2 - Gluteus Wound Laterality: Left Cleanser: Soap and Water (Home Health) 3 x Per Week/30 Days Discharge Instructions: May shower and wash wound with dial antibacterial soap and water prior to dressing change. Prim Dressing:  Promogran Prisma Matrix, 4.34 (sq in) (silver collagen) 3 x Per Week/30 Days ary Discharge Instructions: Apply moistened collagen to undermined area from 1-4 Secondary Dressing: Woven Gauze Sponge, Non-Sterile 4x4 in (Home Health) 3 x Per Week/30 Days Discharge Instructions: Lightly pack Hydrogel moistened gauze to wound bed in clinic. Home health to apply wound vac. Secondary Dressing: ABD Pad, 8x10 (Home Health) 3 x Per Week/30 Days Discharge Instructions: Apply over primary dressing as directed. Secured With: 70M Medipore H Soft Cloth Surgical T 4 x 2 (in/yd) (Home Health) 3 x Per Week/30 Days ape Discharge Instructions: Secure dressing with tape as directed. Wound #3 - Lower Leg Wound Laterality: Right, Lateral Cleanser: Soap and Water (Home Health) 3 x Per Week/30 Days Discharge Instructions: May shower and wash wound with dial antibacterial soap and water prior to dressing change. Cleanser: Wound Cleanser (Home Health) 3 x Per Week/30 Days Discharge Instructions: Cleanse the wound with wound cleanser prior to applying a clean dressing using gauze sponges, not tissue or cotton balls. Prim Dressing: KerraCel Ag Gelling Fiber Dressing, 2x2 in (silver alginate) (Home Health) 3 x Per Week/30 Days ary Discharge Instructions: Apply silver alginate to wound bed as instructed Secondary Dressing: Zetuvit Plus Silicone Border Dressing 4x4 (in/in) ([place] 3 x Per Week/30 Days Discharge Instructions: Apply silicone border over primary dressing as directed. Electronic Signature(s) Signed: 12/23/2020 5:36:21 PM By: RLinton HamMD Signed: 12/23/2020 6:24:39 PM By: BLorrin JacksonEntered By: BLorrin Jacksonon 12/23/2020 09:55:54 -------------------------------------------------------------------------------- Problem List Details Patient Name: Date of Service: BMAEBELL, TIBURCIO8/17/2022 9:15 A M Medical Record Number: [id number] Account Number: [id number] of Birth/Sex: Treating  RN: 4Jul 14, 1937(85y.o. FNancy FetterPrimary Care Provider: ESherrie MustacheOther Clinician: Referring Provider: Treating Provider/Extender: RDeloria Lairin Treatment: 11 Active Problems ICD-10 Encounter Code Description Active Date MDM Diagnosis L89.324 Pressure ulcer of left buttock,  stage 4 11/24/2020 No Yes E11.9 Type 2 diabetes mellitus without complications 99991111 No Yes L89.93 Pressure ulcer of unspecified site, stage 3 12/23/2020 No Yes Inactive Problems ICD-10 Code Description Active Date Inactive Date L89.320 Pressure ulcer of left buttock, unstageable 10/13/2020 10/13/2020 Resolved Problems Electronic Signature(s) Signed: 12/23/2020 5:36:21 PM By: Linton Ham MD Entered By: Linton Ham on 12/23/2020 10:07:49 -------------------------------------------------------------------------------- Progress Note Details Patient Name: Date of Service: [name] 12/23/2020 9:15 A M Medical Record Number: [id number] Patient Account Number: [id number] Date of Birth/Sex: Treating RN: [birth year] (85 y.o. [name] Primary Care Provider: [name] Other Clinician: Referring Provider: Treating Provider/Extender: Deloria Lair in Treatment: 11 Subjective History of Present Illness (HPI) Admission 5/27 Ms. Harvetta Sohmer is an 85 year old female with a past medical history of type 2 diabetes on oral agents, hypertension and dementia that presents today to the clinic for a wound to her right buttocks. Her daughter-in-law is present today and helps provide the history. This issue has been going on for 2 to 3 weeks now. She states she developed an eschar about 1 week ago. She has been taking doxycycline for possible soft tissue infection to the area prescribed by her primary care provider. Patient currently denies any signs of infection. 6/3; patient admitted to the clinic last week. She has a large necrotic area  on her left buttock. They use Santyl last week. On arrival this visit she had completely necrotic surface with open to subcutaneous tissue. The surface was completely nonviable. They have been using Santyl. She apparently had been on doxycycline which she is just finishing prescribed by her primary doctor for possible soft tissue infection 6/7; patient with a large necrotic wound over her left buttock. Aggressive debridement last week. Culture of this grew Proteus unfortunately would not of been covered well or at least predictably by the doxycycline that her primary doctor put her on. I have therefore put her on Augmentin suspension 3 times a day. We are using silver alginate as the primary dressing while we deal with the infection. Her daughter has a list of concerns including swallowing difficulties, concerns about aspiration. The patient has advanced dementia. If this is Alzheimer's disease it is at its preterminal stages. I went over that swallowing difficulties are part of what happens in the preterminal stages of Alzheimer's disease. Nevertheless we will family members seems to want to pursue a very aggressive course 6/21 large wound over her left buttock. She has completed the antibiotics I gave her [Augmentin]. We are using silver alginate while we are dealing with the infection and also to help with the drainage In general the wound looks better she has healthy granulation over perhaps 70% of it. Superiorly there is still necrotic debris I did not attempt to debride this today The patient has advanced dementia. I do not know that she could tolerate a wound VAC. She also has double incontinence which would make that difficult. Nevertheless she has been cared for diligently by her family. She is apparently eating well and may be 2 to 3 hours on this area all day 7/5; 2-week follow-up. She continues with a nice improvement in overall condition of the wound bed. The undermining area has still  some adherent surface slough. We have been using silver alginate because of the drainage and possibility of at least superficial wound infection/colonization. Although I said in previous notes I wondered whether she could tolerate the wound VAC they have done so well with this wound  at home I do not want to necessarily rule her out for a wound VAC and I am going to direct the start of a trial of wound VAC therapy if this can be covered by insurance, if we have home health support to change it at all 7/19; we did not get very far with the wound VAC because my original ICD 10 said this was unstageable. As usual this represents a considerable frustration because we would not of ordered a wound VAC if the wound was still unstageable. This currently represents a stage IV staging. This is all the way down through muscle layer. There is a rim of tissue over the bone and no palpable exposed bone but this is a deep wound. Fortunately at present no evidence of infection. We have been using silver collagen with backing wet-to-dry. The wound is really no better today but no worse 8/2; patient presents for 2-week follow-up. She has been using the wound VAC for the past week. There has been some issues with keeping the drape in place however home health comes out to reapply the drape when this happens. Overall there are no issues or complaints today. No signs of infection. 8/17; patient has been using the wound VAC on her wound on the upper left buttock. About 50 to 60% of this area is fully granulated however from about 10-5 o'clock there is still undermining. UNFORTUNATELY she also arrives in clinic today with a wound over her right fibular head. According to her daughter has been there for about 2 weeks. She wonders whether there is an abrasion although no concrete history of this. Given her frail status this is probably most likely a pressure ulcer over a bony prominence [fibular  head] Objective Constitutional Sitting or standing Blood Pressure is within target range for patient.. Pulse regular and within target range for patient.Marland Kitchen Respirations regular, non-labored and within target range.. Temperature is normal and within the target range for the patient.Marland Kitchen Appears in no distress. Vitals Time Taken: 9:25 AM, Height: 68 in, Weight: 110 lbs, BMI: 16.7, Temperature: 97.8 F, Pulse: 78 bpm, Respiratory Rate: 18 breaths/min, Blood Pressure: 137/80 mmHg, Capillary Blood Glucose: 110 mg/dl. General Notes: Wound exam; about 60% of the surface area of this wound is well granulated. There is no evidence of surrounding infection there is undermining but no exposed bone. No erythema around the wound. No crepitus no tenderness. oo She has a new area today over the right fibular head. Nonviable surface. I used a #5 curette removing slough, nonviable subcutaneous tissue. Hemostasis with direct pressure. This does not probe to bone and again has no evidence of surrounding infection Integumentary (Hair, Skin) Wound #2 status is Open. Original cause of wound was Pressure Injury. The date acquired was: 09/18/2020. The wound has been in treatment 11 weeks. The wound is located on the Left Gluteus. The wound measures 6.2cm length x 5cm width x 1.4cm depth; 24.347cm^2 area and 34.086cm^3 volume. There is Fat Layer (Subcutaneous Tissue) exposed. There is no tunneling noted, however, there is undermining starting at 10:00 and ending at 5:00 with a maximum distance of 4.6cm. There is a medium amount of serosanguineous drainage noted. The wound margin is thickened. There is large (67-100%) red granulation within the wound bed. There is no necrotic tissue within the wound bed. Wound #3 status is Open. Original cause of wound was Pressure Injury. The date acquired was: 12/16/2020. The wound is located on the Right,Lateral Lower Leg. The wound measures 1.7cm length x 1.5cm  width x 0.3cm depth;  2.003cm^2 area and 0.601cm^3 volume. There is Fat Layer (Subcutaneous Tissue) exposed. There is no tunneling or undermining noted. There is a medium amount of serosanguineous drainage noted. The wound margin is well defined and not attached to the wound base. There is large (67-100%) red granulation within the wound bed. There is a small (1-33%) amount of necrotic tissue within the wound bed including Eschar and Adherent Slough. Assessment Active Problems ICD-10 Pressure ulcer of left buttock, stage 4 Type 2 diabetes mellitus without complications Pressure ulcer of unspecified site, stage 3 Procedures Wound #3 Pre-procedure diagnosis of Wound #3 is a Pressure Ulcer located on the Right,Lateral Lower Leg . There was a Selective/Open Wound Non-Viable Tissue Debridement with a total area of 2.55 sq cm performed by Ricard Dillon., MD. With the following instrument(s): Curette to remove Non-Viable tissue/material. Material removed includes Eschar and Slough and after achieving pain control using Other (Benzocaine). No specimens were taken. A time out was conducted at 09:47, prior to the start of the procedure. A Minimum amount of bleeding was controlled with Pressure. The procedure was tolerated well. Post Debridement Measurements: 1.7cm length x 1.5cm width x 0.3cm depth; 0.601cm^3 volume. Post debridement Stage noted as Category/Stage III. Character of Wound/Ulcer Post Debridement is stable. Post procedure Diagnosis Wound #3: Same as Pre-Procedure Plan Follow-up Appointments: Return Appointment in 2 weeks. - on Wednesday Negative Presssure Wound Therapy: Wound #2 Left Gluteus: Wound Vac to wound continuously at 159m/hg pressure Black and White Foam combination - White foam to undermining 10:00-5:00 and black foam to wound bed. Bridge with black foam to patient's side to relieve pressure point. Off-Loading: Low air-loss mattress (Group 2) Turn and reposition every 2 hours Additional  Orders / Instructions: Follow Nutritious Diet - 100-120g of Protein Home Health: New wound care orders this week; continue Home Health for wound care. May utilize formulary equivalent dressing for wound treatment orders unless otherwise specified. - New wound right leg [facility] Monday's, Wednseday's, and Friday's WOUND #2: - Gluteus Wound Laterality: Left Cleanser: Soap and Water (Home Health) 3 x Per Week/30 Days Discharge Instructions: May shower and wash wound with dial antibacterial soap and water prior to dressing change. Prim Dressing: Promogran Prisma Matrix, 4.34 (sq in) (silver collagen) 3 x Per Week/30 Days ary Discharge Instructions: Apply moistened collagen to undermined area from 1-4 Secondary Dressing: Woven Gauze Sponge, Non-Sterile 4x4 in (Home Health) 3 x Per Week/30 Days Discharge Instructions: Lightly pack Hydrogel moistened gauze to wound bed in clinic. Home health to apply wound vac. Secondary Dressing: ABD Pad, 8x10 (Home Health) 3 x Per Week/30 Days Discharge Instructions: Apply over primary dressing as directed. Secured With: 29M Medipore H Soft Cloth Surgical T 4 x 2 (in/yd) (Home Health) 3 x Per Week/30 Days ape Discharge Instructions: Secure dressing with tape as directed. WOUND #3: - Lower Leg Wound Laterality: Right, Lateral Cleanser: Soap and Water (Home Health) 3 x Per Week/30 Days Discharge Instructions: May shower and wash wound with dial antibacterial soap and water prior to dressing change. Cleanser: Wound Cleanser (Home Health) 3 x Per Week/30 Days Discharge Instructions: Cleanse the wound with wound cleanser prior to applying a clean dressing using gauze sponges, not tissue or cotton balls. Prim Dressing: KerraCel Ag Gelling Fiber Dressing, 2x2 in (silver alginate) (Home Health) 3 x Per Week/30 Days ary Discharge Instructions: Apply silver alginate to wound bed as instructed Secondary Dressing: Zetuvit Plus Silicone Border Dressing 4x4 (in/in) ([place] 3 x  Per Week/30 Days Discharge Instructions: Apply silicone border over primary dressing as directed. 1. Continue with the wound VAC on the back wound. Black foam to the wound bed white foam to the undermining areas. I would like to continue to use Prisma especially to the undermining area. 2. Unfortunately we have a new wound over the right fibular head over the last 2 weeks. They have been using silver alginate on this I am okay with continuing this. We talked about offloading these areas which of course is difficult because you are looking at a limited number of positions however we simply are going to have to asked the family to do the best they can Electronic Signature(s) Signed: 12/23/2020 5:36:21 PM By: Linton Ham MD Entered By: Linton Ham on 12/23/2020 10:18:01 -------------------------------------------------------------------------------- SuperBill Details Patient Name: Date of Service: [name] 12/23/2020 Medical Record Number: [id number] Patient Account Number: [id number] Date of Birth/Sex: Treating RN: 02-09-1936 (85 y.o. [name] Primary Care Provider: [name] Other Clinician: Referring Provider: Treating Provider/Extender: Deloria Lair in Treatment: 11 Diagnosis Coding ICD-10 Codes Code Description [phone] Pressure ulcer of left buttock, stage 4 E11.9 Type 2 diabetes mellitus without complications [id number] Pressure ulcer of unspecified site, stage 3 Facility Procedures CPT4 Code: [id number] Description: [id number] - DEB SUBQ TISSUE 20 SQ CM/< ICD-10 Diagnosis Description L89.93 Pressure ulcer of unspecified site, stage 3 Modifier: Quantity: 1 Physician Procedures : CPT4 Code Description Modifier [id number] - WC PHYS SUBQ TISS 20 SQ CM ICD-10 Diagnosis Description L89.93 Pressure ulcer of unspecified site, stage 3 Quantity: 1 Electronic Signature(s) Signed: 12/23/2020 5:36:21 PM By: Linton Ham  MD Entered By: Linton Ham on 12/23/2020 10:18:59

## 2020-12-25 DIAGNOSIS — Z7984 Long term (current) use of oral hypoglycemic drugs: Secondary | ICD-10-CM | POA: Diagnosis not present

## 2020-12-25 DIAGNOSIS — L89320 Pressure ulcer of left buttock, unstageable: Secondary | ICD-10-CM | POA: Diagnosis not present

## 2020-12-25 DIAGNOSIS — F0390 Unspecified dementia without behavioral disturbance: Secondary | ICD-10-CM | POA: Diagnosis not present

## 2020-12-25 DIAGNOSIS — E785 Hyperlipidemia, unspecified: Secondary | ICD-10-CM | POA: Diagnosis not present

## 2020-12-25 DIAGNOSIS — E119 Type 2 diabetes mellitus without complications: Secondary | ICD-10-CM | POA: Diagnosis not present

## 2020-12-25 DIAGNOSIS — I890 Lymphedema, not elsewhere classified: Secondary | ICD-10-CM | POA: Diagnosis not present

## 2020-12-28 DIAGNOSIS — I10 Essential (primary) hypertension: Secondary | ICD-10-CM | POA: Diagnosis not present

## 2020-12-28 DIAGNOSIS — Z741 Need for assistance with personal care: Secondary | ICD-10-CM | POA: Diagnosis not present

## 2020-12-28 DIAGNOSIS — E119 Type 2 diabetes mellitus without complications: Secondary | ICD-10-CM | POA: Diagnosis not present

## 2020-12-28 DIAGNOSIS — Z8673 Personal history of transient ischemic attack (TIA), and cerebral infarction without residual deficits: Secondary | ICD-10-CM | POA: Diagnosis not present

## 2020-12-28 DIAGNOSIS — Z7984 Long term (current) use of oral hypoglycemic drugs: Secondary | ICD-10-CM | POA: Diagnosis not present

## 2020-12-28 DIAGNOSIS — L89320 Pressure ulcer of left buttock, unstageable: Secondary | ICD-10-CM | POA: Diagnosis not present

## 2020-12-28 DIAGNOSIS — E785 Hyperlipidemia, unspecified: Secondary | ICD-10-CM | POA: Diagnosis not present

## 2020-12-28 DIAGNOSIS — M6281 Muscle weakness (generalized): Secondary | ICD-10-CM | POA: Diagnosis not present

## 2020-12-28 DIAGNOSIS — I890 Lymphedema, not elsewhere classified: Secondary | ICD-10-CM | POA: Diagnosis not present

## 2020-12-28 DIAGNOSIS — Z86711 Personal history of pulmonary embolism: Secondary | ICD-10-CM | POA: Diagnosis not present

## 2020-12-28 DIAGNOSIS — Z86718 Personal history of other venous thrombosis and embolism: Secondary | ICD-10-CM | POA: Diagnosis not present

## 2020-12-28 DIAGNOSIS — F0390 Unspecified dementia without behavioral disturbance: Secondary | ICD-10-CM | POA: Diagnosis not present

## 2020-12-30 ENCOUNTER — Telehealth: Payer: Self-pay

## 2020-12-30 DIAGNOSIS — E785 Hyperlipidemia, unspecified: Secondary | ICD-10-CM | POA: Diagnosis not present

## 2020-12-30 DIAGNOSIS — L89320 Pressure ulcer of left buttock, unstageable: Secondary | ICD-10-CM | POA: Diagnosis not present

## 2020-12-30 DIAGNOSIS — I890 Lymphedema, not elsewhere classified: Secondary | ICD-10-CM | POA: Diagnosis not present

## 2020-12-30 DIAGNOSIS — E119 Type 2 diabetes mellitus without complications: Secondary | ICD-10-CM | POA: Diagnosis not present

## 2020-12-30 DIAGNOSIS — F0390 Unspecified dementia without behavioral disturbance: Secondary | ICD-10-CM | POA: Diagnosis not present

## 2020-12-30 DIAGNOSIS — Z7984 Long term (current) use of oral hypoglycemic drugs: Secondary | ICD-10-CM | POA: Diagnosis not present

## 2020-12-30 NOTE — Telephone Encounter (Signed)
[name] from [facility] called and states that patient hasn't had bowel movement in 8 days. She states that patient needs to be seen. She advised that family members use Mirlax. Family member state they havent used this in a while and will start doing so today. Message routed to PCP Dewaine Oats, Carlos American, NP . Please Advise.

## 2020-12-30 NOTE — Telephone Encounter (Signed)
Okay to start miralax, can take twice daily for results. If needed can make appt.

## 2020-12-30 NOTE — Telephone Encounter (Signed)
Called and spoke to "Sofie Rower" patient relative. She states that patient is having bowel movement now and she's going to start back giving Mirlax daily. Patient relative states they will follow up on scheduled appointment 01/04/2021

## 2021-01-01 DIAGNOSIS — I890 Lymphedema, not elsewhere classified: Secondary | ICD-10-CM | POA: Diagnosis not present

## 2021-01-01 DIAGNOSIS — E119 Type 2 diabetes mellitus without complications: Secondary | ICD-10-CM | POA: Diagnosis not present

## 2021-01-01 DIAGNOSIS — E785 Hyperlipidemia, unspecified: Secondary | ICD-10-CM | POA: Diagnosis not present

## 2021-01-01 DIAGNOSIS — L89320 Pressure ulcer of left buttock, unstageable: Secondary | ICD-10-CM | POA: Diagnosis not present

## 2021-01-01 DIAGNOSIS — F0390 Unspecified dementia without behavioral disturbance: Secondary | ICD-10-CM | POA: Diagnosis not present

## 2021-01-01 DIAGNOSIS — Z7984 Long term (current) use of oral hypoglycemic drugs: Secondary | ICD-10-CM | POA: Diagnosis not present

## 2021-01-04 ENCOUNTER — Encounter: Payer: Self-pay | Admitting: Nurse Practitioner

## 2021-01-04 ENCOUNTER — Ambulatory Visit (INDEPENDENT_AMBULATORY_CARE_PROVIDER_SITE_OTHER): Payer: Medicare Other | Admitting: Nurse Practitioner

## 2021-01-04 ENCOUNTER — Other Ambulatory Visit: Payer: Self-pay

## 2021-01-04 VITALS — BP 140/82 | HR 90 | Temp 98.0°F | Resp 16 | Ht 67.0 in

## 2021-01-04 DIAGNOSIS — L89153 Pressure ulcer of sacral region, stage 3: Secondary | ICD-10-CM

## 2021-01-04 DIAGNOSIS — F0390 Unspecified dementia without behavioral disturbance: Secondary | ICD-10-CM

## 2021-01-04 DIAGNOSIS — E1142 Type 2 diabetes mellitus with diabetic polyneuropathy: Secondary | ICD-10-CM | POA: Diagnosis not present

## 2021-01-04 DIAGNOSIS — E119 Type 2 diabetes mellitus without complications: Secondary | ICD-10-CM | POA: Diagnosis not present

## 2021-01-04 DIAGNOSIS — E782 Mixed hyperlipidemia: Secondary | ICD-10-CM

## 2021-01-04 DIAGNOSIS — Z86718 Personal history of other venous thrombosis and embolism: Secondary | ICD-10-CM | POA: Diagnosis not present

## 2021-01-04 DIAGNOSIS — E785 Hyperlipidemia, unspecified: Secondary | ICD-10-CM | POA: Diagnosis not present

## 2021-01-04 DIAGNOSIS — L89320 Pressure ulcer of left buttock, unstageable: Secondary | ICD-10-CM | POA: Diagnosis not present

## 2021-01-04 DIAGNOSIS — I10 Essential (primary) hypertension: Secondary | ICD-10-CM

## 2021-01-04 DIAGNOSIS — I890 Lymphedema, not elsewhere classified: Secondary | ICD-10-CM | POA: Diagnosis not present

## 2021-01-04 DIAGNOSIS — Z7984 Long term (current) use of oral hypoglycemic drugs: Secondary | ICD-10-CM | POA: Diagnosis not present

## 2021-01-04 NOTE — Progress Notes (Signed)
Careteam: Patient Care Team: [name], NP as PCP - General (Geriatric Medicine) [name], MD as Consulting Physician (Oncology) [name], [name], LCSW as Social Worker (Licensed Clinical Social Worker) [name], RN as Registered Nurse (Hospice and Palliative Medicine)  PLACE OF SERVICE:  [facility] Directive information Does Patient Have a Medical Advance Directive?: Yes, Type of Advance Directive: Healthcare Power of [place];Out of facility DNR (pink MOST or yellow form), Does patient want to make changes to medical advance directive?: No - Patient declined  No Known Allergies  Chief Complaint  Patient presents with   Medical Management of Chronic Issues    6 month follow up   Health Maintenance    Discuss the need for Eye exam, and Hemoglobin A1C   Immunizations    Discuss the need for shingrix vaccine, pna vaccine, 2nd covid booster, and tetanus vaccine.     HPI: Patient is a 85 y.o. female for routine follow up.  Ate breakfast before coming in today.   Going to wound center- wound vac lost pressure over the weekend. Nurse is coming to the house this afternoon. Healing slowly. Continues to work on getting proper nutrition and pressure reduction.  Also has wound on right leg. Wound care following  Blood sugar was 112 fasting this morning.  Low 90- high 133 Metformin 500 mg twice daily if needed if blood sugar low in the morning she holds metformin.   Htn- controlled. Doing losartan, will use clonidine ONLY as needed    Constipation- used miralax and bowels have been moving.   Continues on xarelto due to hx of DVT   Review of Systems:  Review of Systems  Unable to perform ROS: Dementia   Past Medical History:  Diagnosis Date   Dementia ([place])    Diabetes mellitus type II 03/21/2011   DVT (deep venous thrombosis) ([place]) 03/21/2011   Hyperlipidemia 03/21/2011   Hypertension 03/21/2011   Lymphedema of leg 03/21/2011    Pulmonary embolism ([place]) 03/21/2011   Stroke [facility])    Past Surgical History:  Procedure Laterality Date   CHOLECYSTECTOMY  2015   SKIN TAG REMOVAL  07/25/2016   VAGINAL HYSTERECTOMY     unknown of date   VASCULAR SURGERY     Social History:   reports that she has never smoked. She has never used smokeless tobacco. She reports that she does not drink alcohol and does not use drugs.  Family History  Problem Relation Age of Onset   Heart attack Mother    COPD Sister    Stroke Sister    Bipolar disorder Daughter     Medications: Patient's Medications  New Prescriptions   No medications on file  Previous Medications   A&D OINT    Apply 1 application topically as needed.    ACCU-CHEK SOFTCLIX LANCETS LANCETS    1 each by Other route 2 (two) times daily. Use as instructed Dx: E11.9   ACETAMINOPHEN (TYLENOL) 500 MG TABLET    Take 500 mg by mouth every 8 (eight) hours as needed (for general pain).    ATORVASTATIN (LIPITOR) 40 MG TABLET    Take 1 tablet (40 mg total) by mouth daily at 6 PM.   CHOLECALCIFEROL (VITAMIN D-3) 125 MCG (5000 UT) TABS    Take 5,000 Units by mouth daily.   CLONIDINE (CATAPRES) 0.1 MG TABLET    Take 1 tablet (0.1 mg total) by mouth 2 (two) times daily. May take an additional  tablet if needed SBP >170   GABAPENTIN (NEURONTIN) 300 MG CAPSULE    Take 1 capsule (300 mg total) by mouth 2 (two) times daily.   GLUCOSE BLOOD (ACCU-CHEK AVIVA PLUS) TEST STRIP    USE STRIP(S) TO TEST TWICE A DAY - (KEEP UNUSED STRIPS IN ORIGINAL SEALED CONTAINER BETWEEN USES)   LOSARTAN (COZAAR) 25 MG TABLET    Take one tablet by mouth once daily, take along with 42m to equal 740m  LOSARTAN (COZAAR) 50 MG TABLET    Take one tablet by mouth once daily, along with 2553mo equal 55m59mMETFORMIN (GLUCOPHAGE) 500 MG TABLET    TAKE ONE TABLET BY MOUTH TWICE A DAY WITH MEALS   NON FORMULARY    Take 1 capsule by mouth See admin instructions. [employer] - UltraFlora Balance Daily Probiotic  Immune Support* capsules- Take 1 capsule by mouth once a day   POLYETHYLENE GLYCOL (MIRALAX / GLYCOLAX) PACKET    Take 17 g by mouth daily as needed for mild constipation (MIX AND DRINK).    RIVAROXABAN (XARELTO) 15 MG TABS TABLET    Take one tablet by mouth once daily with supper  Modified Medications   No medications on file  Discontinued Medications   No medications on file    Physical Exam:  Vitals:   01/04/21 1115  BP: 140/82  Pulse: 90  Resp: 16  Temp: 98 F (36.7 C)  SpO2: 98%  Height: 5' 7"  (1.702 m)   Body mass index is 20.37 kg/m. Wt Readings from Last 3 Encounters:  05/04/19 130 lb 1.1 oz (59 kg)  11/02/18 130 lb (59 kg)  01/30/18 130 lb (59 kg)    Physical Exam Constitutional:      General: She is not in acute distress.    Appearance: She is not diaphoretic.     Comments: Frail thin female in wheelchair. Nonverbal.   HENT:     Head: Normocephalic and atraumatic.     Mouth/Throat:     Pharynx: No oropharyngeal exudate.  Eyes:     Conjunctiva/sclera: Conjunctivae normal.     Pupils: Pupils are equal, round, and reactive to light.  Cardiovascular:     Rate and Rhythm: Normal rate and regular rhythm.     Heart sounds: Normal heart sounds.  Pulmonary:     Effort: Pulmonary effort is normal.     Breath sounds: Normal breath sounds.  Abdominal:     General: Bowel sounds are normal.     Palpations: Abdomen is soft.  Musculoskeletal:     Cervical back: Normal range of motion and neck supple.     Right lower leg: No edema.     Left lower leg: No edema.  Skin:    General: Skin is warm and dry.  Psychiatric:        Mood and Affect: Mood normal.    Labs reviewed: Basic Metabolic Panel: Recent Labs    02/26/20 1154 07/06/20 1632 09/30/20 1819  NA 139 134* 133*  K 4.9 4.8 4.4  CL 100 99 98  CO2 27 22 24   GLUCOSE 209* 127 173*  BUN 26* 22 20  CREATININE 0.62 0.54* 0.47  CALCIUM 9.8 8.9 9.0   Liver Function Tests: Recent Labs    02/26/20 1154  07/06/20 1632 09/30/20 1819  AST 17 12 16   ALT 18 9 13   ALKPHOS  --   --  58  BILITOT 0.4 0.3 0.4  PROT 7.0 6.3 6.8  ALBUMIN  --   --  2.9*   No results for input(s): LIPASE, AMYLASE in the last 8760 hours. No results for input(s): AMMONIA in the last 8760 hours. CBC: Recent Labs    07/06/20 1632 09/30/20 1819 10/16/20 1457  WBC 4.6 9.3 8.1  NEUTROABS 2,075 7.2 5.8  HGB 9.5* 9.6* 8.4*  HCT 29.2* 31.6* 27.1*  MCV 89.3 92.7 93.1  PLT 207 331 PLATELET CLUMPS NOTED ON SMEAR, UNABLE TO ESTIMATE   Lipid Panel: Recent Labs    02/26/20 1154  CHOL 171  HDL 49*  LDLCALC 103*  TRIG 101  CHOLHDL 3.5   TSH: No results for input(s): TSH in the last 8760 hours. A1C: Lab Results  Component Value Date   HGBA1C 7.3 (H) 07/06/2020     Assessment/Plan 1. Primary hypertension --stable. Goal bp <140/90. Continue on current regimen with low sodium diet.  - CMP with eGFR([facility]) - CBC with Differential/Platelet  2. Pressure injury of sacral region, stage 3 ([place]) -followed by wound care at home and   3. Type 2 diabetes mellitus with diabetic polyneuropathy, without long-term current use of insulin (HCC) -Encouraged dietary compliance, routine foot care/monitoring and to keep up with diabetic eye exams through ophthalmology  -continue current medication regimen.  - Hemoglobin A1c  4. Dementia without behavioral disturbance, unspecified dementia type ([place]) -advanced. Total care from her family  68. Mixed hyperlipidemia -continues on lipitor.  - Lipid panel  6. History of DVT (deep vein thrombosis) -continues on xarelto, no signs or recurrence.  -follow up [place]. [place], [facility] [phone]

## 2021-01-05 LAB — LIPID PANEL
Cholesterol: 151 mg/dL (ref <200)
HDL: 43 mg/dL — ABNORMAL LOW (ref >=50)
LDL Cholesterol (Calc): 90 mg/dL (calc)
Non-HDL Cholesterol (Calc): 108 mg/dL (calc) (ref <130)
Total CHOL/HDL Ratio: 3.5 (calc) (ref <5.0)
Triglycerides: 88 mg/dL (ref <150)

## 2021-01-05 LAB — CBC WITH DIFFERENTIAL/PLATELET
Absolute Monocytes: 323 cells/uL (ref 200–950)
Basophils Absolute: 19 cells/uL (ref 0–200)
Basophils Relative: 0.5 %
Eosinophils Absolute: 30 cells/uL (ref 15–500)
Eosinophils Relative: 0.8 %
HCT: 31.3 % — ABNORMAL LOW (ref 35.0–45.0)
Hemoglobin: 9.2 g/dL — ABNORMAL LOW (ref 11.7–15.5)
Lymphs Abs: 1767 cells/uL (ref 850–3900)
MCH: 25.3 pg — ABNORMAL LOW (ref 27.0–33.0)
MCHC: 29.4 g/dL — ABNORMAL LOW (ref 32.0–36.0)
MCV: 86 fL (ref 80.0–100.0)
MPV: 9 fL (ref 7.5–12.5)
Monocytes Relative: 8.5 %
Neutro Abs: 1661 cells/uL (ref 1500–7800)
Neutrophils Relative %: 43.7 %
Platelets: 283 10*3/uL (ref 140–400)
RBC: 3.64 10*6/uL — ABNORMAL LOW (ref 3.80–5.10)
RDW: 13.6 % (ref 11.0–15.0)
Total Lymphocyte: 46.5 %
WBC: 3.8 10*3/uL (ref 3.8–10.8)

## 2021-01-05 LAB — COMPLETE METABOLIC PANEL WITH GFR
AG Ratio: 1 (calc) (ref 1.0–2.5)
ALT: 8 U/L (ref 6–29)
AST: 13 U/L (ref 10–35)
Albumin: 3.4 g/dL — ABNORMAL LOW (ref 3.6–5.1)
Alkaline phosphatase (APISO): 62 U/L (ref 37–153)
BUN/Creatinine Ratio: 52 (calc) — ABNORMAL HIGH (ref 6–22)
BUN: 29 mg/dL — ABNORMAL HIGH (ref 7–25)
CO2: 29 mmol/L (ref 20–32)
Calcium: 9.7 mg/dL (ref 8.6–10.4)
Chloride: 101 mmol/L (ref 98–110)
Creat: 0.56 mg/dL — ABNORMAL LOW (ref 0.60–0.95)
Globulin: 3.3 g/dL (calc) (ref 1.9–3.7)
Glucose, Bld: 191 mg/dL — ABNORMAL HIGH (ref 65–139)
Potassium: 4.7 mmol/L (ref 3.5–5.3)
Sodium: 139 mmol/L (ref 135–146)
Total Bilirubin: 0.3 mg/dL (ref 0.2–1.2)
Total Protein: 6.7 g/dL (ref 6.1–8.1)
eGFR: 90 mL/min/{1.73_m2} (ref >=60)

## 2021-01-05 LAB — HEMOGLOBIN A1C
Hgb A1c MFr Bld: 7 % of total Hgb — ABNORMAL HIGH (ref <5.7)
Mean Plasma Glucose: 154 mg/dL
eAG (mmol/L): 8.5 mmol/L

## 2021-01-06 ENCOUNTER — Other Ambulatory Visit: Payer: Self-pay

## 2021-01-06 ENCOUNTER — Encounter (HOSPITAL_BASED_OUTPATIENT_CLINIC_OR_DEPARTMENT_OTHER): Payer: Medicare Other | Admitting: Internal Medicine

## 2021-01-06 DIAGNOSIS — F0280 Dementia in other diseases classified elsewhere without behavioral disturbance: Secondary | ICD-10-CM | POA: Diagnosis not present

## 2021-01-06 DIAGNOSIS — F0390 Unspecified dementia without behavioral disturbance: Secondary | ICD-10-CM | POA: Diagnosis not present

## 2021-01-06 DIAGNOSIS — E119 Type 2 diabetes mellitus without complications: Secondary | ICD-10-CM | POA: Diagnosis not present

## 2021-01-06 DIAGNOSIS — Z7984 Long term (current) use of oral hypoglycemic drugs: Secondary | ICD-10-CM | POA: Diagnosis not present

## 2021-01-06 DIAGNOSIS — L89893 Pressure ulcer of other site, stage 3: Secondary | ICD-10-CM | POA: Diagnosis not present

## 2021-01-06 DIAGNOSIS — L8993 Pressure ulcer of unspecified site, stage 3: Secondary | ICD-10-CM | POA: Diagnosis not present

## 2021-01-06 DIAGNOSIS — L89320 Pressure ulcer of left buttock, unstageable: Secondary | ICD-10-CM | POA: Diagnosis not present

## 2021-01-06 DIAGNOSIS — E785 Hyperlipidemia, unspecified: Secondary | ICD-10-CM | POA: Diagnosis not present

## 2021-01-06 DIAGNOSIS — I890 Lymphedema, not elsewhere classified: Secondary | ICD-10-CM | POA: Diagnosis not present

## 2021-01-06 DIAGNOSIS — L89324 Pressure ulcer of left buttock, stage 4: Secondary | ICD-10-CM | POA: Diagnosis not present

## 2021-01-06 DIAGNOSIS — G309 Alzheimer's disease, unspecified: Secondary | ICD-10-CM | POA: Diagnosis not present

## 2021-01-06 DIAGNOSIS — I10 Essential (primary) hypertension: Secondary | ICD-10-CM | POA: Diagnosis not present

## 2021-01-06 DIAGNOSIS — E11622 Type 2 diabetes mellitus with other skin ulcer: Secondary | ICD-10-CM | POA: Diagnosis not present

## 2021-01-07 NOTE — Progress Notes (Addendum)
[name] ([id number]) , Visit Report for 01/06/2021 Arrival Information Details Patient Name: Date of Service: [name], [name] 01/06/2021 9:15 A M Medical Record Number: [id number] Patient Account Number: [id number] Date of Birth/Sex: Treating RN: 01-Dec-1935 (85 y.o. [name] Primary Care Jahel Wavra: [name] Other Clinician: Referring Nissa Stannard: Treating Jonah Gingras/Extender: Deloria Lair in Treatment: 47 Visit Information History Since Last Visit Added or deleted any medications: No Patient Arrived: Wheel Chair Any new allergies or adverse reactions: No Arrival Time: 09:40 Had a fall or experienced change in No Accompanied By: daughter activities of daily living that may affect Transfer Assistance: Manual risk of falls: Patient Identification Verified: Yes Signs or symptoms of abuse/neglect since last visito No Secondary Verification Process Completed: Yes Hospitalized since last visit: No Patient Requires Transmission-Based Precautions: No Implantable device outside of the clinic excluding No Patient Has Alerts: No cellular tissue based products placed in the center since last visit: Has Dressing in Place as Prescribed: Yes Pain Present Now: No Electronic Signature(s) Signed: 01/07/2021 5:19:32 PM By: Levan Hurst RN, BSN Entered By: Levan Hurst on 01/06/2021 09:40:25 -------------------------------------------------------------------------------- Complex / Palliative Patient Assessment Details Patient Name: Date of Service: [name], [name] 01/06/2021 9:15 A M Medical Record Number: [id number] Patient Account Number: [id number] Date of Birth/Sex: Treating RN: 11/28/1935 (85 y.o. [name] Primary Care Gustava Berland: [name] Other Clinician: Referring Lonnie Rosado: Treating Tayshon Winker/Extender: Deloria Lair in Treatment: 13 Palliative Management Criteria Complex Wound Management Criteria Patient  has remarkable or complex co-morbidities requiring medications or treatments that extend wound healing times. Examples: Diabetes mellitus with chronic renal failure or end stage renal disease requiring dialysis Advanced or poorly controlled rheumatoid arthritis Diabetes mellitus and end stage chronic obstructive pulmonary disease Active cancer with current chemo- or radiation therapy Type 2 Diabetes, Dementia Care Approach Wound Care Plan: Complex Wound Management Electronic Signature(s) Signed: 02/02/2021 9:10:00 AM By: Linton Ham MD Signed: 02/04/2021 4:50:18 PM By: Levan Hurst RN, BSN Entered By: Levan Hurst on 01/16/2021 12:03:27 -------------------------------------------------------------------------------- Encounter Discharge Information Details Patient Name: Date of Service: [name], [name] 01/06/2021 [place] Record Number: [id number] Patient Account Number: [id number] Date of Birth/Sex: Treating RN: [birth year] (85 y.o. [name] Primary Care Madaleine Simmon: [name] Other Clinician: Referring Khi Mcmillen: Treating Sima Lindenberger/Extender: Deloria Lair in Treatment: 13 Encounter Discharge Information Items Post Procedure Vitals Discharge Condition: Stable Temperature (F): 97.9 Ambulatory Status: Wheelchair Pulse (bpm): 77 Discharge Destination: Home Respiratory Rate (breaths/min): 16 Transportation: Private Auto Blood Pressure (mmHg): 188/73 Accompanied By: daughter Schedule Follow-up Appointment: Yes Clinical Summary of Care: Patient Declined Electronic Signature(s) Signed: 01/07/2021 5:19:32 PM By: Levan Hurst RN, BSN Entered By: Levan Hurst on 01/06/2021 15:19:34 -------------------------------------------------------------------------------- Multi Wound Chart Details Patient Name: Date of Service: [name] 01/06/2021 9:15 A M Medical Record Number: [id number] Patient Account Number: [id number] Date of  Birth/Sex: Treating RN: Jan 29, 1936 (85 y.o. [name] Primary Care Kally Cadden: [name] Other Clinician: Referring Shraga Custard: Treating Meyah Corle/Extender: Deloria Lair in Treatment: 13 Vital Signs Height(in): 60 Capillary Blood Glucose(mg/dl): 150 Weight(lbs): 110 Pulse(bpm): 76 Body Mass Index(BMI): 74 Blood Pressure(mmHg): 188/73 Temperature(F): 97.9 Respiratory Rate(breaths/min): 16 Photos: [N/A:N/A] Left Gluteus Right, Lateral Lower Leg N/A Wound Location: Pressure Injury Pressure Injury N/A Wounding Event: Pressure Ulcer Pressure Ulcer N/A Primary Etiology: Anemia, Deep Vein Thrombosis, Anemia, Deep Vein Thrombosis, N/A Comorbid History: Hypertension, Type II Diabetes, Hypertension, Type II Diabetes, Dementia Dementia 09/18/2020 12/16/2020 N/A Date Acquired: 13 2 N/A Weeks  of Treatment: Open Open N/A Wound Status: 5x4.5x3.7 1.7x1.5x0.2 N/A Measurements L x W x D (cm) 17.671 2.003 N/A A (cm) : rea 65.384 0.401 N/A Volume (cm) : 57.60% 0.00% N/A % Reduction in A rea: -1469.50% 33.30% N/A % Reduction in Volume: 9 Starting Position 1 (o'clock): 12 Ending Position 1 (o'clock): 4 Maximum Distance 1 (cm): Yes No N/A Undermining: Category/Stage III Category/Stage III N/A Classification: Medium Medium N/A Exudate A mount: Serosanguineous Serosanguineous N/A Exudate Type: red, brown red, brown N/A Exudate Color: Thickened Well defined, not attached N/A Wound Margin: Large (67-100%) Medium (34-66%) N/A Granulation A mount: Red Pink, Friable N/A Granulation Quality: Small (1-33%) Medium (34-66%) N/A Necrotic A mount: Fat Layer (Subcutaneous Tissue): Yes Fat Layer (Subcutaneous Tissue): Yes N/A Exposed Structures: Fascia: No Fascia: No Tendon: No Tendon: No Muscle: No Muscle: No Joint: No Joint: No Bone: No Bone: No None None N/A Epithelialization: N/A Debridement - Excisional  N/A Debridement: Pre-procedure Verification/Time Out N/A 10:06 N/A Taken: N/A Subcutaneous, Slough N/A Tissue Debrided: N/A Skin/Subcutaneous Tissue N/A Level: N/A 2.55 N/A Debridement A (sq cm): rea N/A Curette N/A Instrument: N/A Minimum N/A Bleeding: N/A Pressure N/A Hemostasis A chieved: N/A 0 N/A Procedural Pain: N/A 0 N/A Post Procedural Pain: N/A Procedure was tolerated well N/A Debridement Treatment Response: N/A 1.7x1.5x0.2 N/A Post Debridement Measurements L x W x D (cm) N/A 0.401 N/A Post Debridement Volume: (cm) N/A Category/Stage III N/A Post Debridement Stage: N/A Debridement N/A Procedures Performed: Treatment Notes Electronic Signature(s) Signed: 01/06/2021 4:28:25 PM By: Linton Ham MD Signed: 01/07/2021 5:19:32 PM By: Levan Hurst RN, BSN Entered By: Linton Ham on 01/06/2021 10:15:02 -------------------------------------------------------------------------------- Multi-Disciplinary Care Plan Details Patient Name: Date of Service: [place] 01/06/2021 9:15 A M Medical Record Number: [id number] Patient Account Number: [id number] Date of Birth/Sex: Treating RN: [birth year] (85 y.o. [name] Primary Care Kaan Tosh: [name] Other Clinician: Referring Jonathin Heinicke: Treating Mackynzie Woolford/Extender: Deloria Lair in Treatment: 13 Active Inactive Wound/Skin Impairment Nursing Diagnoses: Impaired tissue integrity Goals: Patient/caregiver will verbalize understanding of skin care regimen Date Initiated: 10/02/2020 Target Resolution Date: 02/05/2021 Goal Status: Active Ulcer/skin breakdown will have a volume reduction of 30% by week 4 Date Initiated: 10/02/2020 Date Inactivated: 01/06/2021 Target Resolution Date: 01/09/2021 Goal Status: Met Interventions: Assess patient/caregiver ability to obtain necessary supplies Assess patient/caregiver ability to perform ulcer/skin care regimen upon admission and as  needed Assess ulceration(s) every visit Provide education on ulcer and skin care Treatment Activities: Skin care regimen initiated : 10/02/2020 Topical wound management initiated : 10/02/2020 Notes: 11/10/20 : Goal not yet met, eschar debrided off increasing wound size. Electronic Signature(s) Signed: 01/07/2021 5:19:32 PM By: Levan Hurst RN, BSN Entered By: Levan Hurst on 01/06/2021 10:09:53 -------------------------------------------------------------------------------- Pain Assessment Details Patient Name: Date of Service: [name], [name] 01/06/2021 9:15 A M Medical Record Number: [id number] Patient Account Number: [id number] Date of Birth/Sex: Treating RN: 1935/12/03 (85 y.o. [name] Primary Care Bleu Minerd: [name] Other Clinician: Referring Ahlani Wickes: Treating Stepheny Canal/Extender: Deloria Lair in Treatment: 13 Active Problems Location of Pain Severity and Description of Pain Patient Has Paino No Site Locations Pain Management and Medication Current Pain Management: Electronic Signature(s) Signed: 01/07/2021 5:19:32 PM By: Levan Hurst RN, BSN Entered By: Levan Hurst on 01/06/2021 09:40:49 -------------------------------------------------------------------------------- Patient/Caregiver Education Details Patient Name: Date of Service: [name] 8/31/2022andnbsp9:15 [place] Record Number: [id number] Patient Account Number: [id number] Date of Birth/Gender: Treating RN: 1936-01-21 (85 y.o. [name] Primary Care Physician: Dewaine Oats,  Janett Billow Other Clinician: Referring Physician: Treating Physician/Extender: Deloria Lair in Treatment: 13 Education Assessment Education Provided To: Patient Education Topics Provided Wound/Skin Impairment: Methods: Explain/Verbal Responses: State content correctly [employer]) Signed: 01/07/2021 5:19:32 PM By: Levan Hurst RN, BSN Entered  By: Levan Hurst on 01/06/2021 10:10:05 -------------------------------------------------------------------------------- Wound Assessment Details Patient Name: Date of Service: [name], [name] 01/06/2021 9:15 A M Medical Record Number: [id number] Patient Account Number: [id number] Date of Birth/Sex: Treating RN: 1935-07-17 (85 y.o. [name], [name] Primary Care Melondy Blanchard: [name] Other Clinician: Referring Valli Randol: Treating Zacary Bauer/Extender: Deloria Lair in Treatment: 13 Wound Status Wound Number: 2 Primary Pressure Ulcer Etiology: Wound Location: Left Gluteus Wound Status: Open Wounding Event: Pressure Injury Comorbid Anemia, Deep Vein Thrombosis, Hypertension, Type II Date Acquired: 09/18/2020 History: Diabetes, Dementia Weeks Of Treatment: 13 Clustered Wound: No Photos Wound Measurements Length: (cm) 5 Width: (cm) 4.5 Depth: (cm) 3.7 Area: (cm) 17.671 Volume: (cm) 65.384 % Reduction in Area: 57.6% % Reduction in Volume: -1469.5% Epithelialization: None Tunneling: No Undermining: Yes Starting Position (o'clock): 9 Ending Position (o'clock): 12 Maximum Distance: (cm) 4 Wound Description Classification: Category/Stage III Wound Margin: Thickened Exudate Amount: Medium Exudate Type: Serosanguineous Exudate Color: red, brown Foul Odor After Cleansing: No Slough/Fibrino Yes Wound Bed Granulation Amount: Large (67-100%) Exposed Structure Granulation Quality: Red Fascia Exposed: No Necrotic Amount: Small (1-33%) Fat Layer (Subcutaneous Tissue) Exposed: Yes Necrotic Quality: Adherent Slough Tendon Exposed: No Muscle Exposed: No Joint Exposed: No Bone Exposed: No Electronic Signature(s) Signed: 01/07/2021 4:23:50 PM By: Rhae Hammock RN Entered By: Rhae Hammock on 01/06/2021 09:56:21 -------------------------------------------------------------------------------- Wound Assessment Details Patient Name: Date of  Service: [name], [name] 01/06/2021 9:15 A M Medical Record Number: [id number] Patient Account Number: [id number] Date of Birth/Sex: Treating RN: 09-Oct-1935 (85 y.o. [name], [name] Primary Care Mirtha Jain: [name] Other Clinician: Referring Ryson Bacha: Treating Kaleab Frasier/Extender: Deloria Lair in Treatment: 13 Wound Status Wound Number: 3 Primary Pressure Ulcer Etiology: Wound Location: Right, Lateral Lower Leg Wound Status: Open Wounding Event: Pressure Injury Comorbid Anemia, Deep Vein Thrombosis, Hypertension, Type II Date Acquired: 12/16/2020 History: Diabetes, Dementia Weeks Of Treatment: 2 Clustered Wound: No Photos Wound Measurements Length: (cm) 1.7 Width: (cm) 1.5 Depth: (cm) 0.2 Area: (cm) 2.003 Volume: (cm) 0.401 Wound Description Classification: Category/Stage III Wound Margin: Well defined, not attached Exudate Amount: Medium Exudate Type: Serosanguineous Exudate Color: red, brown Foul Odor After Cleansing: No Slough/Fibrino Yes % Reduction in Area: 0% % Reduction in Volume: 33.3% Epithelialization: None Tunneling: No Undermining: No Wound Bed Granulation Amount: Medium (34-66%) Exposed Structure Granulation Quality: Pink, Friable Fascia Exposed: No Necrotic Amount: Medium (34-66%) Fat Layer (Subcutaneous Tissue) Exposed: Yes Necrotic Quality: Adherent Slough Tendon Exposed: No Muscle Exposed: No Joint Exposed: No Bone Exposed: No Electronic Signature(s) Signed: 01/07/2021 4:23:50 PM By: Rhae Hammock RN Entered By: Rhae Hammock on 01/06/2021 09:44:44 -------------------------------------------------------------------------------- Vitals Details Patient Name: Date of Service: [name] 01/06/2021 9:15 A M Medical Record Number: [id number] Patient Account Number: [id number] Date of Birth/Sex: Treating RN: 10/20/1935 (85 y.o. [name] Primary Care Kazzandra Desaulniers: [name] Other  Clinician: Referring Jadis Mika: Treating Andreal Vultaggio/Extender: Deloria Lair in Treatment: 13 Vital Signs Time Taken: 09:40 Temperature (F): 97.9 Height (in): 68 Pulse (bpm): 77 Weight (lbs): 110 Respiratory Rate (breaths/min): 16 Body Mass Index (BMI): 16.7 Blood Pressure (mmHg): 188/73 Capillary Blood Glucose (mg/dl): 150 Reference Range: 80 - 120 mg / dl Notes glucose per cg report Electronic Signature(s) Signed: 01/07/2021 5:19:32 PM  By: Levan Hurst RN, BSN Entered By: Levan Hurst on 01/06/2021 09:40:45

## 2021-01-07 NOTE — Progress Notes (Signed)
[facility] ([id number]) , Visit Report for 01/06/2021 Debridement Details Patient Name: Date of Service: LETZY, OKON 01/06/2021 9:15 A M Medical Record Number: [id number] Patient Account Number: [id number] Date of Birth/Sex: Treating RN: 1935/08/25 (85 y.o. [name] Primary Care Provider: [name] Other Clinician: Referring Provider: Treating Provider/Extender: Deloria Lair in Treatment: 13 Debridement Performed for Assessment: Wound #3 Right,Lateral Lower Leg Performed By: Physician Ricard Dillon., MD Debridement Type: Debridement Level of Consciousness (Pre-procedure): Awake and Alert Pre-procedure Verification/Time Out Yes - 10:06 Taken: Start Time: 10:06 T Area Debrided (L x W): otal 1.7 (cm) x 1.5 (cm) = 2.55 (cm) Tissue and other material debrided: Viable, Non-Viable, Slough, Subcutaneous, Slough Level: Skin/Subcutaneous Tissue Debridement Description: Excisional Instrument: Curette Bleeding: Minimum Hemostasis Achieved: Pressure End Time: 10:08 Procedural Pain: 0 Post Procedural Pain: 0 Response to Treatment: Procedure was tolerated well Level of Consciousness (Post- Awake and Alert procedure): Post Debridement Measurements of Total Wound Length: (cm) 1.7 Stage: Category/Stage III Width: (cm) 1.5 Depth: (cm) 0.2 Volume: (cm) 0.401 Character of Wound/Ulcer Post Debridement: Requires Further Debridement Post Procedure Diagnosis Same as Pre-procedure Electronic Signature(s) Signed: 01/06/2021 4:28:25 PM By: Linton Ham MD Signed: 01/07/2021 5:19:32 PM By: Levan Hurst RN, BSN Entered By: Linton Ham on 01/06/2021 10:15:24 -------------------------------------------------------------------------------- HPI Details Patient Name: Date of Service: BIRDA, FRYE 01/06/2021 [place] Record Number: [id number] Patient Account Number: [id number] Date of Birth/Sex: Treating RN: 03/23/1936 (85 y.o. [name] Primary Care Provider: [name] Other Clinician: Referring Provider: Treating Provider/Extender: Deloria Lair in Treatment: 13 History of Present Illness HPI Description: Admission 5/27 Ms. Kinyata Hosek is an 85 year old female with a past medical history of type 2 diabetes on oral agents, hypertension and dementia that presents today to the clinic for a wound to her right buttocks. Her daughter-in-law is present today and helps provide the history. This issue has been going on for 2 to 3 weeks now. She states she developed an eschar about 1 week ago. She has been taking doxycycline for possible soft tissue infection to the area prescribed by her primary care provider. Patient currently denies any signs of infection. 6/3; patient admitted to the clinic last week. She has a large necrotic area on her left buttock. They use Santyl last week. On arrival this visit she had completely necrotic surface with open to subcutaneous tissue. The surface was completely nonviable. They have been using Santyl. She apparently had been on doxycycline which she is just finishing prescribed by her primary doctor for possible soft tissue infection 6/7; patient with a large necrotic wound over her left buttock. Aggressive debridement last week. Culture of this grew Proteus unfortunately would not of been covered well or at least predictably by the doxycycline that her primary doctor put her on. I have therefore put her on Augmentin suspension 3 times a day. We are using silver alginate as the primary dressing while we deal with the infection. Her daughter has a list of concerns including swallowing difficulties, concerns about aspiration. The patient has advanced dementia. If this is Alzheimer's disease it is at its preterminal stages. I went over that swallowing difficulties are part of what happens in the preterminal stages of Alzheimer's disease. Nevertheless we will  family members seems to want to pursue a very aggressive course 6/21 large wound over her left buttock. She has completed the antibiotics I gave her [Augmentin]. We are using silver alginate while we are dealing with the  infection and also to help with the drainage In general the wound looks better she has healthy granulation over perhaps 70% of it. Superiorly there is still necrotic debris I did not attempt to debride this today The patient has advanced dementia. I do not know that she could tolerate a wound VAC. She also has double incontinence which would make that difficult. Nevertheless she has been cared for diligently by her family. She is apparently eating well and may be 2 to 3 hours on this area all day 7/5; 2-week follow-up. She continues with a nice improvement in overall condition of the wound bed. The undermining area has still some adherent surface slough. We have been using silver alginate because of the drainage and possibility of at least superficial wound infection/colonization. Although I said in previous notes I wondered whether she could tolerate the wound VAC they have done so well with this wound at home I do not want to necessarily rule her out for a wound VAC and I am going to direct the start of a trial of wound VAC therapy if this can be covered by insurance, if we have home health support to change it at all 7/19; we did not get very far with the wound VAC because my original ICD 10 said this was unstageable. As usual this represents a considerable frustration because we would not of ordered a wound VAC if the wound was still unstageable. This currently represents a stage IV staging. This is all the way down through muscle layer. There is a rim of tissue over the bone and no palpable exposed bone but this is a deep wound. Fortunately at present no evidence of infection. We have been using silver collagen with backing wet-to-dry. The wound is really no better today but no  worse 8/2; patient presents for 2-week follow-up. She has been using the wound VAC for the past week. There has been some issues with keeping the drape in place however home health comes out to reapply the drape when this happens. Overall there are no issues or complaints today. No signs of infection. 8/17; patient has been using the wound VAC on her wound on the upper left buttock. About 50 to 60% of this area is fully granulated however from about 10-5 o'clock there is still undermining. UNFORTUNATELY she also arrives in clinic today with a wound over her right fibular head. According to her daughter has been there for about 2 weeks. She wonders whether there is an abrasion although no concrete history of this. Given her frail status this is probably most likely a pressure ulcer over a bony prominence [fibular head] 8/31 unfortunately the upper left buttock pressure ulcer stage IV is not as optimistically better as I was hoping. The granulation that I identified on 12/23/20 is no longer adherent at the inferior pole the undermining is quite extensive but there is no palpable bone. No evidence of surrounding infection. There may be some improvement in the undermining measurements We are still dealing with the additional wound we identified 2 weeks ago on the right fibular head as well we have been using silver alginate here Electronic Signature(s) Signed: 01/06/2021 4:28:25 PM By: Linton Ham MD Entered By: Linton Ham on 01/06/2021 10:20:22 -------------------------------------------------------------------------------- Physical Exam Details Patient Name: Date of Service: BRANDLYN, PAOLO 01/06/2021 9:15 A M Medical Record Number: [id number] Patient Account Number: [id number] Date of Birth/Sex: Treating RN: Jan 11, 1936 (85 y.o. [name] Primary Care Provider: [name] Other Clinician: Referring Provider:  Treating Provider/Extender: Deloria Lair  in Treatment: 69 Constitutional Patient is hypertensive.. Pulse regular and within target range for patient.Marland Kitchen Respirations regular, non-labored and within target range.. Temperature is normal and within the target range for the patient.Marland Kitchen Appears in no distress. Notes Wound exam; the wound on the buttock is not quite as good as it was 2 weeks ago. The granulation that was adherent last time and about 60% of the surface area has now separated at the inferior pole. While there may be some improvement in the undermining I am uncertain. There is no exposed bone. The wound on the right fibular head is small but is almost 100% covered by necrotic material. I used a #3 curette to remove this. Quite healthy looking post debridement Electronic Signature(s) Signed: 01/06/2021 4:28:25 PM By: Linton Ham MD Signed: 01/06/2021 4:28:25 PM By: Linton Ham MD Entered By: Linton Ham on 01/06/2021 10:19:30 -------------------------------------------------------------------------------- Physician Orders Details Patient Name: Date of Service: AIREAL, SCHEFFLER 01/06/2021 9:15 A M Medical Record Number: [id number] Patient Account Number: [id number] Date of Birth/Sex: Treating RN: January 29, 1936 (85 y.o. [name] Primary Care Provider: [name] Other Clinician: Referring Provider: Treating Provider/Extender: Deloria Lair in Treatment: 13 Verbal / Phone Orders: No Diagnosis Coding ICD-10 Coding Code Description [phone] Pressure ulcer of left buttock, stage 4 E11.9 Type 2 diabetes mellitus without complications [id number] Pressure ulcer of unspecified site, stage 3 Follow-up Appointments ppointment in 2 weeks. - with Dr. Dellia Nims Return A Negative Presssure Wound Therapy Wound #2 Left Gluteus Wound Vac to wound continuously at 179m/hg pressure Black and White Foam combination - White foam to undermining and black foam to wound bed. Bridge with black foam to  patient's side to relieve pressure point. Off-Loading Low air-loss mattress (Group 2) Turn and reposition every 2 hours Additional Orders / Instructions Follow Nutritious Diet - 100-120g of Protein Home Health New wound care orders this week; continue Home Health for wound care. May utilize formulary equivalent dressing for wound treatment orders unless otherwise specified. - change primary dressing on right leg to silver collagen, continue vac on left gluteus Other Home Health Orders/Instructions: - [facility] Monday, Wednesday, and Friday Wound Treatment Wound #2 - Gluteus Wound Laterality: Left Cleanser: Soap and Water (Home Health) 3 x Per Week/30 Days Discharge Instructions: May shower and wash wound with dial antibacterial soap and water prior to dressing change. Prim Dressing: Promogran Prisma Matrix, 4.34 (sq in) (silver collagen) 3 x Per Week/30 Days ary Discharge Instructions: Apply moistened collagen to undermined area from 1-4 Secondary Dressing: Woven Gauze Sponge, Non-Sterile 4x4 in (Home Health) 3 x Per Week/30 Days Discharge Instructions: Lightly pack Hydrogel moistened gauze to wound bed in clinic. Home health to apply wound vac. Secondary Dressing: ABD Pad, 8x10 (Home Health) 3 x Per Week/30 Days Discharge Instructions: Apply over primary dressing as directed. Secured With: 64M Medipore H Soft Cloth Surgical T 4 x 2 (in/yd) (Home Health) 3 x Per Week/30 Days ape Discharge Instructions: Secure dressing with tape as directed. Wound #3 - Lower Leg Wound Laterality: Right, Lateral Cleanser: Soap and Water (Home Health) 3 x Per Week/30 Days Discharge Instructions: May shower and wash wound with dial antibacterial soap and water prior to dressing change. Cleanser: Wound Cleanser (Home Health) 3 x Per Week/30 Days Discharge Instructions: Cleanse the wound with wound cleanser prior to applying a clean dressing using gauze sponges, not tissue or cotton balls. Prim Dressing:  Promogran Prisma Matrix, 4.34 (sq in) (silver collagen)  3 x Per Week/30 Days ary Discharge Instructions: Moisten collagen with saline or hydrogel Secondary Dressing: Zetuvit Plus Silicone Border Dressing 4x4 (in/in) ([place]) 3 x Per Week/30 Days Discharge Instructions: Apply silicone border over primary dressing as directed. Electronic Signature(s) Signed: 01/06/2021 4:28:25 PM By: Linton Ham MD Signed: 01/07/2021 5:19:32 PM By: Levan Hurst RN, BSN Entered By: Levan Hurst on 01/06/2021 10:11:33 -------------------------------------------------------------------------------- Problem List Details Patient Name: Date of Service: MALIEKA, HOLNESS 01/06/2021 9:15 A M Medical Record Number: [id number] Patient Account Number: [id number] Date of Birth/Sex: Treating RN: December 23, 1935 (85 y.o. [name] Primary Care Provider: [name] Other Clinician: Referring Provider: Treating Provider/Extender: Deloria Lair in Treatment: 13 Active Problems ICD-10 Encounter Code Description Active Date MDM Diagnosis [phone] Pressure ulcer of left buttock, stage 4 11/24/2020 No Yes E11.9 Type 2 diabetes mellitus without complications 99991111 No Yes L89.93 Pressure ulcer of unspecified site, stage 3 12/23/2020 No Yes Inactive Problems ICD-10 Code Description Active Date Inactive Date L89.320 Pressure ulcer of left buttock, unstageable 10/13/2020 10/13/2020 Resolved Problems Electronic Signature(s) Signed: 01/06/2021 4:28:25 PM By: Linton Ham MD Entered By: Linton Ham on 01/06/2021 10:14:52 -------------------------------------------------------------------------------- Progress Note Details Patient Name: Date of Service: [name] 01/06/2021 9:15 A M Medical Record Number: [id number] Patient Account Number: [id number] Date of Birth/Sex: Treating RN: 03-Aug-1935 (85 y.o. [name] Primary Care Provider: [name] Other  Clinician: Referring Provider: Treating Provider/Extender: Deloria Lair in Treatment: 13 Subjective History of Present Illness (HPI) Admission 5/27 Ms. Allysah Goni is an 85 year old female with a past medical history of type 2 diabetes on oral agents, hypertension and dementia that presents today to the clinic for a wound to her right buttocks. Her daughter-in-law is present today and helps provide the history. This issue has been going on for 2 to 3 weeks now. She states she developed an eschar about 1 week ago. She has been taking doxycycline for possible soft tissue infection to the area prescribed by her primary care provider. Patient currently denies any signs of infection. 6/3; patient admitted to the clinic last week. She has a large necrotic area on her left buttock. They use Santyl last week. On arrival this visit she had completely necrotic surface with open to subcutaneous tissue. The surface was completely nonviable. They have been using Santyl. She apparently had been on doxycycline which she is just finishing prescribed by her primary doctor for possible soft tissue infection 6/7; patient with a large necrotic wound over her left buttock. Aggressive debridement last week. Culture of this grew Proteus unfortunately would not of been covered well or at least predictably by the doxycycline that her primary doctor put her on. I have therefore put her on Augmentin suspension 3 times a day. We are using silver alginate as the primary dressing while we deal with the infection. Her daughter has a list of concerns including swallowing difficulties, concerns about aspiration. The patient has advanced dementia. If this is Alzheimer's disease it is at its preterminal stages. I went over that swallowing difficulties are part of what happens in the preterminal stages of Alzheimer's disease. Nevertheless we will family members seems to want to pursue a very aggressive  course 6/21 large wound over her left buttock. She has completed the antibiotics I gave her [Augmentin]. We are using silver alginate while we are dealing with the infection and also to help with the drainage In general the wound looks better she has healthy granulation over perhaps  70% of it. Superiorly there is still necrotic debris I did not attempt to debride this today The patient has advanced dementia. I do not know that she could tolerate a wound VAC. She also has double incontinence which would make that difficult. Nevertheless she has been cared for diligently by her family. She is apparently eating well and may be 2 to 3 hours on this area all day 7/5; 2-week follow-up. She continues with a nice improvement in overall condition of the wound bed. The undermining area has still some adherent surface slough. We have been using silver alginate because of the drainage and possibility of at least superficial wound infection/colonization. Although I said in previous notes I wondered whether she could tolerate the wound VAC they have done so well with this wound at home I do not want to necessarily rule her out for a wound VAC and I am going to direct the start of a trial of wound VAC therapy if this can be covered by insurance, if we have home health support to change it at all 7/19; we did not get very far with the wound VAC because my original ICD 10 said this was unstageable. As usual this represents a considerable frustration because we would not of ordered a wound VAC if the wound was still unstageable. This currently represents a stage IV staging. This is all the way down through muscle layer. There is a rim of tissue over the bone and no palpable exposed bone but this is a deep wound. Fortunately at present no evidence of infection. We have been using silver collagen with backing wet-to-dry. The wound is really no better today but no worse 8/2; patient presents for 2-week follow-up. She has  been using the wound VAC for the past week. There has been some issues with keeping the drape in place however home health comes out to reapply the drape when this happens. Overall there are no issues or complaints today. No signs of infection. 8/17; patient has been using the wound VAC on her wound on the upper left buttock. About 50 to 60% of this area is fully granulated however from about 10-5 o'clock there is still undermining. UNFORTUNATELY she also arrives in clinic today with a wound over her right fibular head. According to her daughter has been there for about 2 weeks. She wonders whether there is an abrasion although no concrete history of this. Given her frail status this is probably most likely a pressure ulcer over a bony prominence [fibular head] 8/31 unfortunately the upper left buttock pressure ulcer stage IV is not as optimistically better as I was hoping. The granulation that I identified on 12/23/20 is no longer adherent at the inferior pole the undermining is quite extensive but there is no palpable bone. No evidence of surrounding infection. There may be some improvement in the undermining measurements We are still dealing with the additional wound we identified 2 weeks ago on the right fibular head as well we have been using silver alginate here Objective Constitutional Patient is hypertensive.. Pulse regular and within target range for patient.Marland Kitchen Respirations regular, non-labored and within target range.. Temperature is normal and within the target range for the patient.Marland Kitchen Appears in no distress. Vitals Time Taken: 9:40 AM, Height: 68 in, Weight: 110 lbs, BMI: 16.7, Temperature: 97.9 F, Pulse: 77 bpm, Respiratory Rate: 16 breaths/min, Blood Pressure: 188/73 mmHg, Capillary Blood Glucose: 150 mg/dl. General Notes: glucose per cg report General Notes: Wound exam; the wound on the  buttock is not quite as good as it was 2 weeks ago. The granulation that was adherent last time and  about 60% of the surface area has now separated at the inferior pole. While there may be some improvement in the undermining I am uncertain. There is no exposed bone. oo The wound on the right fibular head is small but is almost 100% covered by necrotic material. I used a #3 curette to remove this. Quite healthy looking post debridement Integumentary (Hair, Skin) Wound #2 status is Open. Original cause of wound was Pressure Injury. The date acquired was: 09/18/2020. The wound has been in treatment 13 weeks. The wound is located on the Left Gluteus. The wound measures 5cm length x 4.5cm width x 3.7cm depth; 17.671cm^2 area and 65.384cm^3 volume. There is Fat Layer (Subcutaneous Tissue) exposed. There is no tunneling noted, however, there is undermining starting at 9:00 and ending at 12:00 with a maximum distance of 4cm. There is a medium amount of serosanguineous drainage noted. The wound margin is thickened. There is large (67-100%) red granulation within the wound bed. There is a small (1-33%) amount of necrotic tissue within the wound bed including Adherent Slough. Wound #3 status is Open. Original cause of wound was Pressure Injury. The date acquired was: 12/16/2020. The wound has been in treatment 2 weeks. The wound is located on the Right,Lateral Lower Leg. The wound measures 1.7cm length x 1.5cm width x 0.2cm depth; 2.003cm^2 area and 0.401cm^3 volume. There is Fat Layer (Subcutaneous Tissue) exposed. There is no tunneling or undermining noted. There is a medium amount of serosanguineous drainage noted. The wound margin is well defined and not attached to the wound base. There is medium (34-66%) pink, friable granulation within the wound bed. There is a medium (34-66%) amount of necrotic tissue within the wound bed including Adherent Slough. Assessment Active Problems ICD-10 Pressure ulcer of left buttock, stage 4 Type 2 diabetes mellitus without complications Pressure ulcer of unspecified  site, stage 3 Procedures Wound #3 Pre-procedure diagnosis of Wound #3 is a Pressure Ulcer located on the Right,Lateral Lower Leg . There was a Excisional Skin/Subcutaneous Tissue Debridement with a total area of 2.55 sq cm performed by Ricard Dillon., MD. With the following instrument(s): Curette to remove Viable and Non-Viable tissue/material. Material removed includes Subcutaneous Tissue and Slough and. No specimens were taken. A time out was conducted at 10:06, prior to the start of the procedure. A Minimum amount of bleeding was controlled with Pressure. The procedure was tolerated well with a pain level of 0 throughout and a pain level of 0 following the procedure. Post Debridement Measurements: 1.7cm length x 1.5cm width x 0.2cm depth; 0.401cm^3 volume. Post debridement Stage noted as Category/Stage III. Character of Wound/Ulcer Post Debridement requires further debridement. Post procedure Diagnosis Wound #3: Same as Pre-Procedure Plan Follow-up Appointments: Return Appointment in 2 weeks. - with Dr. Dellia Nims Negative Presssure Wound Therapy: Wound #2 Left Gluteus: Wound Vac to wound continuously at 15m/hg pressure Black and White Foam combination - White foam to undermining and black foam to wound bed. Bridge with black foam to patient's side to relieve pressure point. Off-Loading: Low air-loss mattress (Group 2) Turn and reposition every 2 hours Additional Orders / Instructions: Follow Nutritious Diet - 100-120g of Protein Home Health: New wound care orders this week; continue Home Health for wound care. May utilize formulary equivalent dressing for wound treatment orders unless otherwise specified. - change primary dressing on right leg to silver collagen, continue vac  on left gluteus Other Home Health Orders/Instructions: - [facility] Monday, Wednesday, and Friday WOUND #2: - Gluteus Wound Laterality: Left Cleanser: Soap and Water (Home Health) 3 x Per Week/30 Days Discharge  Instructions: May shower and wash wound with dial antibacterial soap and water prior to dressing change. Prim Dressing: Promogran Prisma Matrix, 4.34 (sq in) (silver collagen) 3 x Per Week/30 Days ary Discharge Instructions: Apply moistened collagen to undermined area from 1-4 Secondary Dressing: Woven Gauze Sponge, Non-Sterile 4x4 in (Home Health) 3 x Per Week/30 Days Discharge Instructions: Lightly pack Hydrogel moistened gauze to wound bed in clinic. Home health to apply wound vac. Secondary Dressing: ABD Pad, 8x10 (Home Health) 3 x Per Week/30 Days Discharge Instructions: Apply over primary dressing as directed. Secured With: 1M Medipore H Soft Cloth Surgical T 4 x 2 (in/yd) (Home Health) 3 x Per Week/30 Days ape Discharge Instructions: Secure dressing with tape as directed. WOUND #3: - Lower Leg Wound Laterality: Right, Lateral Cleanser: Soap and Water (Home Health) 3 x Per Week/30 Days Discharge Instructions: May shower and wash wound with dial antibacterial soap and water prior to dressing change. Cleanser: Wound Cleanser (Home Health) 3 x Per Week/30 Days Discharge Instructions: Cleanse the wound with wound cleanser prior to applying a clean dressing using gauze sponges, not tissue or cotton balls. Prim Dressing: Promogran Prisma Matrix, 4.34 (sq in) (silver collagen) 3 x Per Week/30 Days ary Discharge Instructions: Moisten collagen with saline or hydrogel Secondary Dressing: Zetuvit Plus Silicone Border Dressing 4x4 (in/in) ([place]) 3 x Per Week/30 Days Discharge Instructions: Apply silicone border over primary dressing as directed. 1. On the buttock wound I am continuing with the wound VAC. We had some initial success although we may be stalled I am not certain at this point however and I think continuing the wound VAC until I am more certain is in order. Still using collagen at the base 2. Fibular head wound required debridement. This is small and cleans up quite nicely I am  hopeful that we will be able to get this to close. I did change the primary dressing from silver alginate to silver collagen Electronic Signature(s) Signed: 01/06/2021 4:28:25 PM By: Linton Ham MD Entered By: Linton Ham on 01/06/2021 10:21:55 -------------------------------------------------------------------------------- SuperBill Details Patient Name: Date of Service: [name] 01/06/2021 Medical Record Number: [id number] Patient Account Number: [id number] Date of Birth/Sex: Treating RN: 05-30-1935 (85 y.o. [name] Primary Care Provider: [name] Other Clinician: Referring Provider: Treating Provider/Extender: Deloria Lair in Treatment: 13 Diagnosis Coding ICD-10 Codes Code Description [phone] Pressure ulcer of left buttock, stage 4 E11.9 Type 2 diabetes mellitus without complications [id number] Pressure ulcer of unspecified site, stage 3 Facility Procedures CPT4 Code: [id number] Description: [id number] - DEB SUBQ TISSUE 20 SQ CM/< ICD-10 Diagnosis Description L89.93 Pressure ulcer of unspecified site, stage 3 Modifier: Quantity: 1 Physician Procedures : CPT4 Code Description Modifier [id number] - WC PHYS SUBQ TISS 20 SQ CM ICD-10 Diagnosis Description L89.93 Pressure ulcer of unspecified site, stage 3 Quantity: 1 Electronic Signature(s) Signed: 01/06/2021 4:28:25 PM By: Linton Ham MD Entered By: Linton Ham on 01/06/2021 10:22:12

## 2021-01-08 DIAGNOSIS — L89320 Pressure ulcer of left buttock, unstageable: Secondary | ICD-10-CM | POA: Diagnosis not present

## 2021-01-08 DIAGNOSIS — I890 Lymphedema, not elsewhere classified: Secondary | ICD-10-CM | POA: Diagnosis not present

## 2021-01-08 DIAGNOSIS — E119 Type 2 diabetes mellitus without complications: Secondary | ICD-10-CM | POA: Diagnosis not present

## 2021-01-08 DIAGNOSIS — E785 Hyperlipidemia, unspecified: Secondary | ICD-10-CM | POA: Diagnosis not present

## 2021-01-08 DIAGNOSIS — F0390 Unspecified dementia without behavioral disturbance: Secondary | ICD-10-CM | POA: Diagnosis not present

## 2021-01-08 DIAGNOSIS — Z7984 Long term (current) use of oral hypoglycemic drugs: Secondary | ICD-10-CM | POA: Diagnosis not present

## 2021-01-11 DIAGNOSIS — Z7984 Long term (current) use of oral hypoglycemic drugs: Secondary | ICD-10-CM | POA: Diagnosis not present

## 2021-01-11 DIAGNOSIS — E119 Type 2 diabetes mellitus without complications: Secondary | ICD-10-CM | POA: Diagnosis not present

## 2021-01-11 DIAGNOSIS — I890 Lymphedema, not elsewhere classified: Secondary | ICD-10-CM | POA: Diagnosis not present

## 2021-01-11 DIAGNOSIS — E785 Hyperlipidemia, unspecified: Secondary | ICD-10-CM | POA: Diagnosis not present

## 2021-01-11 DIAGNOSIS — F0390 Unspecified dementia without behavioral disturbance: Secondary | ICD-10-CM | POA: Diagnosis not present

## 2021-01-11 DIAGNOSIS — L89320 Pressure ulcer of left buttock, unstageable: Secondary | ICD-10-CM | POA: Diagnosis not present

## 2021-01-13 DIAGNOSIS — E785 Hyperlipidemia, unspecified: Secondary | ICD-10-CM | POA: Diagnosis not present

## 2021-01-13 DIAGNOSIS — Z7984 Long term (current) use of oral hypoglycemic drugs: Secondary | ICD-10-CM | POA: Diagnosis not present

## 2021-01-13 DIAGNOSIS — L89320 Pressure ulcer of left buttock, unstageable: Secondary | ICD-10-CM | POA: Diagnosis not present

## 2021-01-13 DIAGNOSIS — I890 Lymphedema, not elsewhere classified: Secondary | ICD-10-CM | POA: Diagnosis not present

## 2021-01-13 DIAGNOSIS — F0390 Unspecified dementia without behavioral disturbance: Secondary | ICD-10-CM | POA: Diagnosis not present

## 2021-01-13 DIAGNOSIS — E119 Type 2 diabetes mellitus without complications: Secondary | ICD-10-CM | POA: Diagnosis not present

## 2021-01-15 DIAGNOSIS — L89320 Pressure ulcer of left buttock, unstageable: Secondary | ICD-10-CM | POA: Diagnosis not present

## 2021-01-15 DIAGNOSIS — E119 Type 2 diabetes mellitus without complications: Secondary | ICD-10-CM | POA: Diagnosis not present

## 2021-01-15 DIAGNOSIS — E785 Hyperlipidemia, unspecified: Secondary | ICD-10-CM | POA: Diagnosis not present

## 2021-01-15 DIAGNOSIS — I890 Lymphedema, not elsewhere classified: Secondary | ICD-10-CM | POA: Diagnosis not present

## 2021-01-15 DIAGNOSIS — F0390 Unspecified dementia without behavioral disturbance: Secondary | ICD-10-CM | POA: Diagnosis not present

## 2021-01-15 DIAGNOSIS — Z7984 Long term (current) use of oral hypoglycemic drugs: Secondary | ICD-10-CM | POA: Diagnosis not present

## 2021-01-18 DIAGNOSIS — I890 Lymphedema, not elsewhere classified: Secondary | ICD-10-CM | POA: Diagnosis not present

## 2021-01-18 DIAGNOSIS — E785 Hyperlipidemia, unspecified: Secondary | ICD-10-CM | POA: Diagnosis not present

## 2021-01-18 DIAGNOSIS — L89320 Pressure ulcer of left buttock, unstageable: Secondary | ICD-10-CM | POA: Diagnosis not present

## 2021-01-18 DIAGNOSIS — F0390 Unspecified dementia without behavioral disturbance: Secondary | ICD-10-CM | POA: Diagnosis not present

## 2021-01-18 DIAGNOSIS — E119 Type 2 diabetes mellitus without complications: Secondary | ICD-10-CM | POA: Diagnosis not present

## 2021-01-18 DIAGNOSIS — Z7984 Long term (current) use of oral hypoglycemic drugs: Secondary | ICD-10-CM | POA: Diagnosis not present

## 2021-01-19 NOTE — Progress Notes (Signed)
[name] ([id number]) , Visit Report for 12/08/2020 Chief Complaint Document Details Patient Name: Date of Service: [name], Contreras 12/08/2020 12:30 PM Medical Record Number: [id number] Patient Account Number: [id number] Date of Birth/Sex: Treating RN: 1935/07/15 (85 y.o. [name], Lauren Primary Care Provider: [name] Other Clinician: Referring Provider: Treating Provider/Extender: Rolanda Lundborg in Treatment: 9 Information Obtained from: Patient Chief Complaint Right buttocks pressure ulcer Electronic Signature(s) Signed: 12/08/2020 2:24:50 PM By: Kalman Shan DO Entered By: Kalman Shan on 12/08/2020 14:10:27 -------------------------------------------------------------------------------- HPI Details Patient Name: Date of Service: [name], [name] 12/08/2020 12:30 PM Medical Record Number: [id number] Patient Account Number: [id number] Date of Birth/Sex: Treating RN: 01-28-1936 (85 y.o. [name] Primary Care Provider: [name] Other Clinician: Referring Provider: Treating Provider/Extender: Rolanda Lundborg in Treatment: 9 History of Present Illness HPI Description: Admission 5/27 [name] is an 85 year old female with a past medical history of type 2 diabetes on oral agents, hypertension and dementia that presents today to the clinic for a wound to her right buttocks. Her daughter-in-law is present today and helps provide the history. This issue has been going on for 2 to 3 weeks now. She states she developed an eschar about 1 week ago. She has been taking doxycycline for possible soft tissue infection to the area prescribed by her primary care provider. Patient currently denies any signs of infection. 6/3; patient admitted to the clinic last week. She has a large necrotic area on her left buttock. They use Santyl last week. On arrival this visit she had completely necrotic surface with  open to subcutaneous tissue. The surface was completely nonviable. They have been using Santyl. She apparently had been on doxycycline which she is just finishing prescribed by her primary doctor for possible soft tissue infection 6/7; patient with a large necrotic wound over her left buttock. Aggressive debridement last week. Culture of this grew Proteus unfortunately would not of been covered well or at least predictably by the doxycycline that her primary doctor put her on. I have therefore put her on Augmentin suspension 3 times a day. We are using silver alginate as the primary dressing while we deal with the infection. Her daughter has a list of concerns including swallowing difficulties, concerns about aspiration. The patient has advanced dementia. If this is Alzheimer's disease it is at its preterminal stages. I went over that swallowing difficulties are part of what happens in the preterminal stages of Alzheimer's disease. Nevertheless we will family members seems to want to pursue a very aggressive course 6/21 large wound over her left buttock. She has completed the antibiotics I gave her [Augmentin]. We are using silver alginate while we are dealing with the infection and also to help with the drainage In general the wound looks better she has healthy granulation over perhaps 70% of it. Superiorly there is still necrotic debris I did not attempt to debride this today The patient has advanced dementia. I do not know that she could tolerate a wound VAC. She also has double incontinence which would make that difficult. Nevertheless she has been cared for diligently by her family. She is apparently eating well and may be 2 to 3 hours on this area all day 7/5; 2-week follow-up. She continues with a nice improvement in overall condition of the wound bed. The undermining area has still some adherent surface slough. We have been using silver alginate because of the drainage and possibility of at  least superficial wound infection/colonization.  Although I said in previous notes I wondered whether she could tolerate the wound VAC they have done so well with this wound at home I do not want to necessarily rule her out for a wound VAC and I am going to direct the start of a trial of wound VAC therapy if this can be covered by insurance, if we have home health support to change it at all 7/19; we did not get very far with the wound VAC because my original ICD 10 said this was unstageable. As usual this represents a considerable frustration because we would not of ordered a wound VAC if the wound was still unstageable. This currently represents a stage IV staging. This is all the way down through muscle layer. There is a rim of tissue over the bone and no palpable exposed bone but this is a deep wound. Fortunately at present no evidence of infection. We have been using silver collagen with backing wet-to-dry. The wound is really no better today but no worse 8/2; patient presents for 2-week follow-up. She has been using the wound VAC for the past week. There has been some issues with keeping the drape in place however home health comes out to reapply the drape when this happens. Overall there are no issues or complaints today. No signs of infection. Electronic Signature(s) Signed: 12/08/2020 2:24:50 PM By: Kalman Shan DO Entered By: Kalman Shan on 12/08/2020 14:11:44 -------------------------------------------------------------------------------- Physical Exam Details Patient Name: Date of Service: [name], [name] 12/08/2020 12:30 PM Medical Record Number: [id number] Patient Account Number: [id number] Date of Birth/Sex: Treating RN: 03-08-1936 (85 y.o. [name] Primary Care Provider: [name] Other Clinician: Referring Provider: Treating Provider/Extender: Rolanda Lundborg in Treatment: 9 Constitutional respirations regular, non-labored and  within target range for patient.Marland Kitchen Psychiatric pleasant and cooperative. Notes Sacrum: Open wound with undermining to the superior aspect. Granulation tissue throughout. No signs of infection. Electronic Signature(s) Signed: 12/08/2020 2:24:50 PM By: Kalman Shan DO Entered By: Kalman Shan on 12/08/2020 14:12:47 -------------------------------------------------------------------------------- Physician Orders Details Patient Name: Date of Service: [name], [name] 12/08/2020 12:30 PM Medical Record Number: [id number] Patient Account Number: [id number] Date of Birth/Sex: Treating RN: 1936/01/23 (85 y.o. [name], Lauren Primary Care Provider: [name] Other Clinician: Referring Provider: Treating Provider/Extender: Rolanda Lundborg in Treatment: 9 Verbal / Phone Orders: No Diagnosis Coding ICD-10 Coding Code Description [phone] Pressure ulcer of left buttock, stage 4 E11.9 Type 2 diabetes mellitus without complications Follow-up Appointments ppointment in 2 weeks. - on Wednesday Return A Negative Presssure Wound Therapy Wound #2 Left Gluteus Wound Vac to wound continuously at 169m/hg pressure Black and White Foam combination - White foam to undermining and black foam to wound bed. Bridge with black foam to patient's side to relieve pressure point. Off-Loading Low air-loss mattress (Group 2) - Old one not working, new one ordered Turn and reposition every 2 hours Additional Orders / Instructions Follow Nutritious Diet - 100-120g of Protein Home Health New wound care orders this week; continue Home Health for wound care. May utilize formulary equivalent dressing for wound treatment orders unless otherwise specified. -[facility] Monday's, Wednseday's, and Friday's Wound Treatment Wound #2 - Gluteus Wound Laterality: Left Cleanser: Soap and Water (Home Health) 1 x Per Day/30 Days Discharge Instructions: May shower and wash wound with dial  antibacterial soap and water prior to dressing change. Prim Dressing: Promogran Prisma Matrix, 4.34 (sq in) (silver collagen) 1 x Per Day/30 Days ary Discharge Instructions: Apply  moistened collagen to undermined area from 1-4 Secondary Dressing: Woven Gauze Sponge, Non-Sterile 4x4 in (Home Health) 1 x Per Day/30 Days Discharge Instructions: Lightly pack Hydrogel moistened gauze to wound bed in clinic. Home health to apply wound vac. Secondary Dressing: ABD Pad, 8x10 (Home Health) 1 x Per Day/30 Days Discharge Instructions: Apply over primary dressing as directed. Secured With: 19M Medipore H Soft Cloth Surgical T 4 x 2 (in/yd) (Home Health) 1 x Per Day/30 Days ape Discharge Instructions: Secure dressing with tape as directed. Electronic Signature(s) Signed: 12/08/2020 2:24:50 PM By: Kalman Shan DO Entered By: Kalman Shan on 12/08/2020 14:13:08 -------------------------------------------------------------------------------- Problem List Details Patient Name: Date of Service: [name], [name] 12/08/2020 12:30 PM Medical Record Number: [id number] Patient Account Number: [id number] Date of Birth/Sex: Treating RN: Sep 28, 1935 (86 y.o. [name], Lauren Primary Care Provider: [name] Other Clinician: Referring Provider: Treating Provider/Extender: Jennell Corner Weeks in Treatment: 9 Active Problems ICD-10 Encounter Code Description Active Date MDM Diagnosis L89.324 Pressure ulcer of left buttock, stage 4 11/24/2020 No Yes E11.9 Type 2 diabetes mellitus without complications [id number] No Yes Inactive Problems ICD-10 Code Description Active Date Inactive Date L89.320 Pressure ulcer of left buttock, unstageable 10/13/2020 10/13/2020 Resolved Problems Electronic Signature(s) Signed: 12/08/2020 2:24:50 PM By: Kalman Shan DO Entered By: Kalman Shan on 12/08/2020  14:10:15 -------------------------------------------------------------------------------- Progress Note Details Patient Name: Date of Service: [name] 12/08/2020 12:30 PM Medical Record Number: [id number] Patient Account Number: [id number] Date of Birth/Sex: Treating RN: 1935-08-05 (85 y.o. [name], Lauren Primary Care Provider: [name] Other Clinician: Referring Provider: Treating Provider/Extender: Rolanda Lundborg in Treatment: 9 Subjective Chief Complaint Information obtained from Patient Right buttocks pressure ulcer History of Present Illness (HPI) Admission 5/27 Ms. Cindee Mohr is an 85 year old female with a past medical history of type 2 diabetes on oral agents, hypertension and dementia that presents today to the clinic for a wound to her right buttocks. Her daughter-in-law is present today and helps provide the history. This issue has been going on for 2 to 3 weeks now. She states she developed an eschar about 1 week ago. She has been taking doxycycline for possible soft tissue infection to the area prescribed by her primary care provider. Patient currently denies any signs of infection. 6/3; patient admitted to the clinic last week. She has a large necrotic area on her left buttock. They use Santyl last week. On arrival this visit she had completely necrotic surface with open to subcutaneous tissue. The surface was completely nonviable. They have been using Santyl. She apparently had been on doxycycline which she is just finishing prescribed by her primary doctor for possible soft tissue infection 6/7; patient with a large necrotic wound over her left buttock. Aggressive debridement last week. Culture of this grew Proteus unfortunately would not of been covered well or at least predictably by the doxycycline that her primary doctor put her on. I have therefore put her on Augmentin suspension 3 times a day. We are using silver alginate  as the primary dressing while we deal with the infection. Her daughter has a list of concerns including swallowing difficulties, concerns about aspiration. The patient has advanced dementia. If this is Alzheimer's disease it is at its preterminal stages. I went over that swallowing difficulties are part of what happens in the preterminal stages of Alzheimer's disease. Nevertheless we will family members seems to want to pursue a very aggressive course 6/21 large wound over her left buttock. She has completed  the antibiotics I gave her [Augmentin]. We are using silver alginate while we are dealing with the infection and also to help with the drainage In general the wound looks better she has healthy granulation over perhaps 70% of it. Superiorly there is still necrotic debris I did not attempt to debride this today The patient has advanced dementia. I do not know that she could tolerate a wound VAC. She also has double incontinence which would make that difficult. Nevertheless she has been cared for diligently by her family. She is apparently eating well and may be 2 to 3 hours on this area all day 7/5; 2-week follow-up. She continues with a nice improvement in overall condition of the wound bed. The undermining area has still some adherent surface slough. We have been using silver alginate because of the drainage and possibility of at least superficial wound infection/colonization. Although I said in previous notes I wondered whether she could tolerate the wound VAC they have done so well with this wound at home I do not want to necessarily rule her out for a wound VAC and I am going to direct the start of a trial of wound VAC therapy if this can be covered by insurance, if we have home health support to change it at all 7/19; we did not get very far with the wound VAC because my original ICD 10 said this was unstageable. As usual this represents a considerable frustration because we would not of  ordered a wound VAC if the wound was still unstageable. This currently represents a stage IV staging. This is all the way down through muscle layer. There is a rim of tissue over the bone and no palpable exposed bone but this is a deep wound. Fortunately at present no evidence of infection. We have been using silver collagen with backing wet-to-dry. The wound is really no better today but no worse 8/2; patient presents for 2-week follow-up. She has been using the wound VAC for the past week. There has been some issues with keeping the drape in place however home health comes out to reapply the drape when this happens. Overall there are no issues or complaints today. No signs of infection. Patient History Unable to Obtain Patient History due to Dementia. Information obtained from Patient. Family History Stroke - Siblings, No family history of Cancer, Diabetes, Heart Disease, Hereditary Spherocytosis, Hypertension, Kidney Disease, Lung Disease, Seizures, Thyroid Problems, Tuberculosis. Social History Never smoker, Marital Status - Divorced, Alcohol Use - Never, Drug Use - No History, Caffeine Use - Never. Medical History Eyes Denies history of Cataracts, Glaucoma, Optic Neuritis Ear/Nose/Mouth/Throat Denies history of Chronic sinus problems/congestion, Middle ear problems Hematologic/Lymphatic Patient has history of Anemia Denies history of Hemophilia, Human Immunodeficiency Virus, Lymphedema, Sickle Cell Disease Respiratory Denies history of Aspiration, Asthma, Chronic Obstructive Pulmonary Disease (COPD), Pneumothorax, Sleep Apnea, Tuberculosis Cardiovascular Patient has history of Deep Vein Thrombosis, Hypertension Denies history of Angina, Arrhythmia, Congestive Heart Failure, Coronary Artery Disease, Hypotension, Myocardial Infarction, Peripheral Arterial Disease, Peripheral Venous Disease, Phlebitis, Vasculitis Gastrointestinal Denies history of Cirrhosis , Colitis, Crohnoos,  Hepatitis A, Hepatitis B, Hepatitis C Endocrine Patient has history of Type II Diabetes Denies history of Type I Diabetes Genitourinary Denies history of End Stage Renal Disease Immunological Denies history of Lupus Erythematosus, Raynaudoos, Scleroderma Integumentary (Skin) Denies history of History of Burn Musculoskeletal Denies history of Gout, Rheumatoid Arthritis, Osteoarthritis, Osteomyelitis Neurologic Patient has history of Dementia Denies history of Neuropathy, Quadriplegia, Paraplegia, Seizure Disorder Oncologic Denies history of  Received Chemotherapy, Received Radiation Psychiatric Denies history of Anorexia/bulimia, Confinement Anxiety Hospitalization/Surgery History - cholecystectomy. - vaginal hysterectomy. - vascular surgery. Medical A Surgical History Notes nd Respiratory hx PE Cardiovascular atrial septal aneurysm, hyperlipidemia Genitourinary recent UTi Neurologic CVA Objective Constitutional respirations regular, non-labored and within target range for patient.. Vitals Time Taken: 12:52 PM, Height: 68 in, Source: Stated, Weight: 110 lbs, Source: Stated, BMI: 16.7, Temperature: 98.5 F, Pulse: 85 bpm, Respiratory Rate: 18 breaths/min, Blood Pressure: 177/91 mmHg, Capillary Blood Glucose: 120 mg/dl. General Notes: glucose per family report this am Psychiatric pleasant and cooperative. General Notes: Sacrum: Open wound with undermining to the superior aspect. Granulation tissue throughout. No signs of infection. Integumentary (Hair, Skin) Wound #2 status is Open. Original cause of wound was Pressure Injury. The date acquired was: 09/18/2020. The wound has been in treatment 9 weeks. The wound is located on the Left Gluteus. The wound measures 6cm length x 6cm width x 1.4cm depth; 28.274cm^2 area and 39.584cm^3 volume. There is Fat Layer (Subcutaneous Tissue) exposed. There is no tunneling noted, however, there is undermining starting at 12:00 and ending at  5:00 with a maximum distance of 4cm. There is a medium amount of serosanguineous drainage noted. The wound margin is well defined and not attached to the wound base. There is large (67-100%) red granulation within the wound bed. There is no necrotic tissue within the wound bed. Assessment Active Problems ICD-10 Pressure ulcer of left buttock, stage 4 Type 2 diabetes mellitus without complications Patient's wound has shown improvement in size and appearance since last clinic visit. No signs of infection on exam. I think she is doing very well with the wound VAC. We will continue this. Home health changes the wound VAC. Follow-up in 2 weeks. Plan Follow-up Appointments: Return Appointment in 2 weeks. - on Wednesday Negative Presssure Wound Therapy: Wound #2 Left Gluteus: Wound Vac to wound continuously at 139m/hg pressure Black and White Foam combination - White foam to undermining and black foam to wound bed. Bridge with black foam to patient's side to relieve pressure point. Off-Loading: Low air-loss mattress (Group 2) - Old one not working, new one ordered Turn and reposition every 2 hours Additional Orders / Instructions: Follow Nutritious Diet - 100-120g of Protein Home Health: New wound care orders this week; continue Home Health for wound care. May utilize formulary equivalent dressing for wound treatment orders unless otherwise specified. -Latricia [facility]ft[facility] Monday's, Wednseday's, and Friday's WOUND #2: - Gluteus Wound Laterality: Left Cleanser: Soap and Water (Home Health) 1 x Per Day/30 Days Discharge Instructions: May shower and wash wound with dial antibacterial soap and water prior to dressing change. Prim Dressing: Promogran Prisma Matrix, 4.34 (sq in) (silver collagen) 1 x Per Day/30 Days ary Discharge Instructions: Apply moistened collagen to undermined area from 1-4 Secondary Dressing: Woven Gauze Sponge, Non-Sterile 4x4 in (Home Health) 1 x Per Day/30 Days Discharge  Instructions: Lightly pack Hydrogel moistened gauze to wound bed in clinic. Home health to apply wound vac. Secondary Dressing: ABD Pad, 8x10 (Home Health) 1 x Per Day/30 Days Discharge Instructions: Apply over primary dressing as directed. Secured With: 71M Medipore H Soft Cloth Surgical T 4 x 2 (in/yd) (Home Health) 1 x Per Day/30 Days ape Discharge Instructions: Secure dressing with tape as directed. 1. Continue wound VAC 2. Follow-up in 2 weeks Electronic Signature(s) Signed: 12/08/2020 2:24:50 PM By: HKalman ShanDO Entered By: HKalman Shanon 12/08/2020 14:15:10 -------------------------------------------------------------------------------- HxROS Details Patient Name: Date of Service: BArdeen Contreras  12/08/2020 12:30 PM Medical Record Number: [id number] Patient Account Number: [id number] Date of Birth/Sex: Treating RN: 1935/07/15 (85 y.o. [name], Lauren Primary Care Provider: [name] Other Clinician: Referring Provider: Treating Provider/Extender: Rolanda Lundborg in Treatment: 9 Unable to Obtain Patient History due to Dementia Information Obtained From Patient Eyes Medical History: Negative for: Cataracts; Glaucoma; Optic Neuritis Ear/Nose/Mouth/Throat Medical History: Negative for: Chronic sinus problems/congestion; Middle ear problems Hematologic/Lymphatic Medical History: Positive for: Anemia Negative for: Hemophilia; Human Immunodeficiency Virus; Lymphedema; Sickle Cell Disease Respiratory Medical History: Negative for: Aspiration; Asthma; Chronic Obstructive Pulmonary Disease (COPD); Pneumothorax; Sleep Apnea; Tuberculosis Past Medical History Notes: hx PE Cardiovascular Medical History: Positive for: Deep Vein Thrombosis; Hypertension Negative for: Angina; Arrhythmia; Congestive Heart Failure; Coronary Artery Disease; Hypotension; Myocardial Infarction; Peripheral Arterial Disease; Peripheral Venous Disease;  Phlebitis; Vasculitis Past Medical History Notes: atrial septal aneurysm, hyperlipidemia Gastrointestinal Medical History: Negative for: Cirrhosis ; Colitis; Crohns; Hepatitis A; Hepatitis B; Hepatitis C Endocrine Medical History: Positive for: Type II Diabetes Negative for: Type I Diabetes Time with diabetes: 15 years Treated with: Oral agents Blood sugar tested every day: Yes Tested : 2 times per day Genitourinary Medical History: Negative for: End Stage Renal Disease Past Medical History Notes: recent UTi Immunological Medical History: Negative for: Lupus Erythematosus; Raynauds; Scleroderma Integumentary (Skin) Medical History: Negative for: History of Burn Musculoskeletal Medical History: Negative for: Gout; Rheumatoid Arthritis; Osteoarthritis; Osteomyelitis Neurologic Medical History: Positive for: Dementia Negative for: Neuropathy; Quadriplegia; Paraplegia; Seizure Disorder Past Medical History Notes: CVA Oncologic Medical History: Negative for: Received Chemotherapy; Received Radiation Psychiatric Medical History: Negative for: Anorexia/bulimia; Confinement Anxiety Immunizations Pneumococcal Vaccine: Received Pneumococcal Vaccination: No Implantable Devices None Hospitalization / Surgery History Type of Hospitalization/Surgery cholecystectomy vaginal hysterectomy vascular surgery Family and Social History Cancer: No; Diabetes: No; Heart Disease: No; Hereditary Spherocytosis: No; Hypertension: No; Kidney Disease: No; Lung Disease: No; Seizures: No; Stroke: Yes - Siblings; Thyroid Problems: No; Tuberculosis: No; Never smoker; Marital Status - Divorced; Alcohol Use: Never; Drug Use: No History; Caffeine Use: Never; Financial Concerns: No; Food, Clothing or Shelter Needs: No; Support System Lacking: No; Transportation Concerns: No Electronic Signature(s) Signed: 12/08/2020 2:24:50 PM By: Kalman Shan DO Signed: 01/19/2021 8:00:33 AM By: Rhae Hammock  RN Entered By: Kalman Shan on 12/08/2020 14:11:51 -------------------------------------------------------------------------------- SuperBill Details Patient Name: Date of Service: [name] 12/08/2020 Medical Record Number: [id number] Patient Account Number: [id number] Date of Birth/Sex: Treating RN: 09/28/1935 (85 y.o. [name], Lauren Primary Care Provider: [name] Other Clinician: Referring Provider: Treating Provider/Extender: Rolanda Lundborg in Treatment: 9 Diagnosis Coding ICD-10 Codes Code Description [phone] Pressure ulcer of left buttock, stage 4 E11.9 Type 2 diabetes mellitus without complications Facility Procedures CPT4 Code: [id number] Description: [id number] NEG PRESS WND TX <=50 SQ CM Modifier: Quantity: 1 Physician Procedures : CPT4 Code Description Modifier [id number] - WC PHYS LEVEL 3 - EST PT ICD-10 Diagnosis Description L89.324 Pressure ulcer of left buttock, stage 4 E11.9 Type 2 diabetes mellitus without complications Quantity: 1 Electronic Signature(s) Signed: 12/08/2020 2:24:50 PM By: Kalman Shan DO Signed: 01/19/2021 8:00:33 AM By: Rhae Hammock RN Entered By: Rhae Hammock on 12/08/2020 14:19:13

## 2021-01-19 NOTE — Progress Notes (Signed)
[name] ([id number]) , Visit Report for 12/08/2020 Arrival Information Details Patient Name: Date of Service: Carolyn, Contreras 12/08/2020 12:30 PM Medical Record Number: [id number] Patient Account Number: [id number] Date of Birth/Sex: Treating RN: 07-10-1935 (85 y.o. [name] Primary Care Phenix Grein: [name] Other Clinician: Referring Lunabelle Oatley: Treating Normon Pettijohn/Extender: Rolanda Lundborg in Treatment: 9 Visit Information History Since Last Visit Added or deleted any medications: No Patient Arrived: Wheel Chair Any new allergies or adverse reactions: No Arrival Time: 12:30 Had a fall or experienced change in No Accompanied By: daughter in law activities of daily living that may affect Transfer Assistance: None risk of falls: Patient Identification Verified: Yes Signs or symptoms of abuse/neglect since last visito No Secondary Verification Process Completed: Yes Hospitalized since last visit: No Patient Requires Transmission-Based Precautions: No Implantable device outside of the clinic excluding No Patient Has Alerts: No cellular tissue based products placed in the center since last visit: Has Dressing in Place as Prescribed: Yes Pain Present Now: Yes Electronic Signature(s) Signed: 12/08/2020 5:31:21 PM By: Baruch Gouty RN, BSN Entered By: Baruch Gouty on 12/08/2020 12:47:05 -------------------------------------------------------------------------------- Clinic Level of Care Assessment Details Patient Name: Date of Service: Carolyn, Contreras 12/08/2020 12:30 PM Medical Record Number: [id number] Patient Account Number: [id number] Date of Birth/Sex: Treating RN: 1935-06-12 (85 y.o. [name], Lauren Primary Care Kenniel Bergsma: [name] Other Clinician: Referring Rheana Casebolt: Treating Chrishun Scheer/Extender: Rolanda Lundborg in Treatment: 9 Clinic Level of Care Assessment Items TOOL 4 Quantity Score X- 1 0 Use  when only an EandM is performed on FOLLOW-UP visit ASSESSMENTS - Nursing Assessment / Reassessment X- 1 10 Reassessment of Co-morbidities (includes updates in patient status) X- 1 5 Reassessment of Adherence to Treatment Plan ASSESSMENTS - Wound and Skin A ssessment / Reassessment X - Simple Wound Assessment / Reassessment - one wound 1 5 []  - 0 Complex Wound Assessment / Reassessment - multiple wounds X- 1 10 Dermatologic / Skin Assessment (not related to wound area) ASSESSMENTS - Focused Assessment []  - 0 Circumferential Edema Measurements - multi extremities []  - 0 Nutritional Assessment / Counseling / Intervention []  - 0 Lower Extremity Assessment (monofilament, tuning fork, pulses) []  - 0 Peripheral Arterial Disease Assessment (using hand held doppler) ASSESSMENTS - Ostomy and/or Continence Assessment and Care []  - 0 Incontinence Assessment and Management []  - 0 Ostomy Care Assessment and Management (repouching, etc.) PROCESS - Coordination of Care []  - 0 Simple Patient / Family Education for ongoing care X- 1 20 Complex (extensive) Patient / Family Education for ongoing care X- 1 10 Staff obtains Programmer, systems, Records, T Results / Process Orders est X- 1 10 Staff telephones HHA, Nursing Homes / Clarify orders / etc []  - 0 Routine Transfer to another Facility (non-emergent condition) []  - 0 Routine Hospital Admission (non-emergent condition) []  - 0 New Admissions / Biomedical engineer / Ordering NPWT Apligraf, etc. , []  - 0 Emergency Hospital Admission (emergent condition) []  - 0 Simple Discharge Coordination X- 1 15 Complex (extensive) Discharge Coordination PROCESS - Special Needs []  - 0 Pediatric / Minor Patient Management []  - 0 Isolation Patient Management []  - 0 Hearing / Language / Visual special needs []  - 0 Assessment of Community assistance (transportation, D/C planning, etc.) []  - 0 Additional assistance / Altered mentation []  - 0 Support  Surface(s) Assessment (bed, cushion, seat, etc.) INTERVENTIONS - Wound Cleansing / Measurement X - Simple Wound Cleansing - one wound 1 5 []  - 0 Complex Wound Cleansing - multiple wounds  X- 1 5 Wound Imaging (photographs - any number of wounds) []  - 0 Wound Tracing (instead of photographs) X- 1 5 Simple Wound Measurement - one wound []  - 0 Complex Wound Measurement - multiple wounds INTERVENTIONS - Wound Dressings []  - 0 Small Wound Dressing one or multiple wounds X- 1 15 Medium Wound Dressing one or multiple wounds []  - 0 Large Wound Dressing one or multiple wounds []  - 0 Application of Medications - topical []  - 0 Application of Medications - injection INTERVENTIONS - Miscellaneous []  - 0 External ear exam []  - 0 Specimen Collection (cultures, biopsies, blood, body fluids, etc.) []  - 0 Specimen(s) / Culture(s) sent or taken to Lab for analysis []  - 0 Patient Transfer (multiple staff / Civil Service fast streamer / Similar devices) []  - 0 Simple Staple / Suture removal (25 or less) []  - 0 Complex Staple / Suture removal (26 or more) []  - 0 Hypo / Hyperglycemic Management (close monitor of Blood Glucose) []  - 0 Ankle / Brachial Index (ABI) - do not check if billed separately X- 1 5 Vital Signs Has the patient been seen at the hospital within the last three years: Yes Total Score: 120 Level Of Care: New/Established - Level 4 Electronic Signature(s) Signed: 01/19/2021 8:00:33 AM By: Rhae Hammock RN Entered By: Rhae Hammock on 12/08/2020 13:02:37 -------------------------------------------------------------------------------- Encounter Discharge Information Details Patient Name: Date of Service: [name] 12/08/2020 12:30 PM Medical Record Number: [id number] Patient Account Number: [id number] Date of Birth/Sex: Treating RN: 01-26-1936 (85 y.o. [name] Primary Care Acadia Thammavong: [name] Other Clinician: Referring Alinda Egolf: Treating Keiry Kowal/Extender:  Rolanda Lundborg in Treatment: 9 Encounter Discharge Information Items Discharge Condition: Stable Ambulatory Status: Wheelchair Discharge Destination: Home Transportation: Private Auto Accompanied By: daughter in law Schedule Follow-up Appointment: Yes Clinical Summary of Care: Provided on 12/08/2020 Form Type Recipient Paper Patient Patient Electronic Signature(s) Signed: 12/08/2020 1:46:29 PM By: Lorrin Jackson Previous Signature: 12/08/2020 1:46:03 PM Version By: Lorrin Jackson Entered By: Lorrin Jackson on 12/08/2020 13:46:29 -------------------------------------------------------------------------------- Lower Extremity Assessment Details Patient Name: Date of Service: BAYLEY, YARBOROUGH 12/08/2020 12:30 PM Medical Record Number: [id number] Patient Account Number: [id number] Date of Birth/Sex: Treating RN: 1935-11-27 (85 y.o. [name] Primary Care Mikael Debell: [name] Other Clinician: Referring Rosser Collington: Treating Sephiroth Mcluckie/Extender: Jennell Corner Weeks in Treatment: 9 Electronic Signature(s) Signed: 12/08/2020 1:45:12 PM By: Lorrin Jackson Entered By: Lorrin Jackson on 12/08/2020 13:45:12 -------------------------------------------------------------------------------- Multi Wound Chart Details Patient Name: Date of Service: YAMILEX, BORGWARDT 12/08/2020 12:30 PM Medical Record Number: [id number] Patient Account Number: [id number] Date of Birth/Sex: Treating RN: July 20, 1935 (84 y.o. [name], Lauren Primary Care Zachary Lovins: [name] Other Clinician: Referring Kurstyn Larios: Treating Ezzard Ditmer/Extender: Rolanda Lundborg in Treatment: 9 Vital Signs Height(in): 68 Capillary Blood Glucose(mg/dl): 120 Weight(lbs): 110 Pulse(bpm): 34 Body Mass Index(BMI): 17 Blood Pressure(mmHg): 177/91 Temperature(F): 98.5 Respiratory Rate(breaths/min): 18 Photos: [N/A:N/A] Left Gluteus N/A N/A Wound  Location: Pressure Injury N/A N/A Wounding Event: Pressure Ulcer N/A N/A Primary Etiology: Anemia, Deep Vein Thrombosis, N/A N/A Comorbid History: Hypertension, Type II Diabetes, Dementia 09/18/2020 N/A N/A Date Acquired: 9 N/A N/A Weeks of Treatment: Open N/A N/A Wound Status: 6x6x1.4 N/A N/A Measurements L x W x D (cm) 28.274 N/A N/A A (cm) : rea 39.584 N/A N/A Volume (cm) : 32.10% N/A N/A % Reduction in A rea: -850.20% N/A N/A % Reduction in Volume: 12 Starting Position 1 (o'clock): 5 Ending Position 1 (o'clock): 4 Maximum Distance 1 (cm): Yes N/A N/A  Undermining: Category/Stage III N/A N/A Classification: Medium N/A N/A Exudate A mount: Serosanguineous N/A N/A Exudate Type: red, brown N/A N/A Exudate Color: Well defined, not attached N/A N/A Wound Margin: Large (67-100%) N/A N/A Granulation A mount: Red N/A N/A Granulation Quality: None Present (0%) N/A N/A Necrotic A mount: Fat Layer (Subcutaneous Tissue): Yes N/A N/A Exposed Structures: Fascia: No Tendon: No Muscle: No Joint: No Bone: No None N/A N/A Epithelialization: Treatment Notes Wound #2 (Gluteus) Wound Laterality: Left Cleanser Soap and Water Discharge Instruction: May shower and wash wound with dial antibacterial soap and water prior to dressing change. Peri-Wound Care Topical Primary Dressing Promogran Prisma Matrix, 4.34 (sq in) (silver collagen) Discharge Instruction: Apply moistened collagen to undermined area from 1-4 Secondary Dressing Woven Gauze Sponge, Non-Sterile 4x4 in Discharge Instruction: Lightly pack Hydrogel moistened gauze to wound bed in clinic. Home health to apply wound vac. ABD Pad, 8x10 Discharge Instruction: Apply over primary dressing as directed. Secured With 59M Medipore H Soft Cloth Surgical T 4 x 2 (in/yd) ape Discharge Instruction: Secure dressing with tape as directed. Compression Wrap Compression Stockings Add-Ons Electronic  Signature(s) Signed: 12/08/2020 2:24:50 PM By: Kalman Shan DO Signed: 01/19/2021 8:00:33 AM By: Rhae Hammock RN Entered By: Kalman Shan on 12/08/2020 14:10:20 -------------------------------------------------------------------------------- Multi-Disciplinary Care Plan Details Patient Name: Date of Service: LANETT, LASORSA 12/08/2020 12:30 PM Medical Record Number: [id number] Patient Account Number: [id number] Date of Birth/Sex: Treating RN: Oct 14, 1935 (85 y.o. [name], Lauren Primary Care Anay Walter: [name] Other Clinician: Referring Rustyn Conery: Treating Braydin Aloi/Extender: Rolanda Lundborg in Treatment: 9 Active Inactive Wound/Skin Impairment Nursing Diagnoses: Impaired tissue integrity Goals: Patient/caregiver will verbalize understanding of skin care regimen Date Initiated: 10/02/2020 Date Inactivated: 11/10/2020 Target Resolution Date: 10/30/2020 Goal Status: Met Ulcer/skin breakdown will have a volume reduction of 30% by week 4 Date Initiated: 10/02/2020 Target Resolution Date: 01/09/2021 Goal Status: Active Interventions: Assess patient/caregiver ability to obtain necessary supplies Assess patient/caregiver ability to perform ulcer/skin care regimen upon admission and as needed Assess ulceration(s) every visit Provide education on ulcer and skin care Treatment Activities: Skin care regimen initiated : 10/02/2020 Topical wound management initiated : 10/02/2020 Notes: 11/10/20 : Goal not yet met, eschar debrided off increasing wound size. Electronic Signature(s) Signed: 01/19/2021 8:00:33 AM By: Rhae Hammock RN Entered By: Rhae Hammock on 12/08/2020 13:01:35 -------------------------------------------------------------------------------- Negative Pressure Wound Therapy Application (NPWT) Details Patient Name: Date of Service: JAILYN, LEESON 12/08/2020 12:30 PM Medical Record Number: [id number] Patient Account Number:  [id number] Date of Birth/Sex: Treating RN: 01/10/1936 (85 y.o. [name], Lauren Primary Care Alasha Mcguinness: [name] Other Clinician: Referring Savera Donson: Treating Laelle Bridgett/Extender: Rolanda Lundborg in Treatment: 9 NPWT Application Performed for: Wound #2 Left Gluteus Performed By: Rhae Hammock, RN Type: VAC System Coverage Size (sq cm): 36 Pressure Type: Constant Pressure Setting: 125 mmHG Drain Type: None Primary Contact: Non-Adherent Quantity of Sponges/Gauze Inserted: 1 black foam 1 white foam Sponge/Dressing Type: Combination Combination: white and black foam Date Initiated: 12/08/2020 Response to Treatment: tolerated well Post Procedure Diagnosis Same as Pre-procedure Electronic Signature(s) Signed: 01/19/2021 8:00:33 AM By: Rhae Hammock RN Entered By: Rhae Hammock on 12/08/2020 14:18:52 -------------------------------------------------------------------------------- Pain Assessment Details Patient Name: Date of Service: SHALONDA, SACHSE 12/08/2020 12:30 PM Medical Record Number: [id number] Patient Account Number: [id number] Date of Birth/Sex: Treating RN: 08-26-1935 (85 y.o. [name] Primary Care Kianni Lheureux: [name] Other Clinician: Referring Vasilis Luhman: Treating Glorimar Stroope/Extender: Rolanda Lundborg in Treatment: 9 Active Problems Location of Pain Severity and  Description of Pain Patient Has Paino No Site Locations With Dressing Change: No Duration of the Pain. Constant / Intermittento Intermittent Rate the pain. Current Pain Level: 0 Worst Pain Level: 4 Pain Management and Medication Current Pain Management: Notes family reports pt grimaces or moans with movement Electronic Signature(s) Signed: 12/08/2020 5:31:21 PM By: Baruch Gouty RN, BSN Entered By: Baruch Gouty on 12/08/2020  12:53:13 -------------------------------------------------------------------------------- Patient/Caregiver Education Details Patient Name: Date of Service: [name] 8/2/2022andnbsp12:30 PM Medical Record Number: [id number] Patient Account Number: [id number] Date of Birth/Gender: Treating RN: [birth year] (85 y.o. [name] Primary Care Physician: [name] Other Clinician: Referring Physician: Treating Physician/Extender: Rolanda Lundborg in Treatment: 9 Education Assessment Education Provided To: Patient Education Topics Provided Wound/Skin Impairment: Methods: Explain/Verbal Responses: State content correctly [employer]) Signed: 01/19/2021 8:00:33 AM By: Rhae Hammock RN Entered By: Rhae Hammock on 12/08/2020 13:00:29 -------------------------------------------------------------------------------- Wound Assessment Details Patient Name: Date of Service: JUDEEN, GERALDS 12/08/2020 12:30 PM Medical Record Number: [id number] Patient Account Number: [id number] Date of Birth/Sex: Treating RN: 04-Feb-1936 (85 y.o. [name] Primary Care Anibal Quinby: [name] Other Clinician: Referring Breeanne Oblinger: Treating Bilan Tedesco/Extender: Rolanda Lundborg in Treatment: 9 Wound Status Wound Number: 2 Primary Pressure Ulcer Etiology: Wound Location: Left Gluteus Wound Status: Open Wounding Event: Pressure Injury Comorbid Anemia, Deep Vein Thrombosis, Hypertension, Type II Date Acquired: 09/18/2020 History: Diabetes, Dementia Weeks Of Treatment: 9 Clustered Wound: No Photos Wound Measurements Length: (cm) 6 Width: (cm) 6 Depth: (cm) 1.4 Area: (cm) 28.274 Volume: (cm) 39.584 % Reduction in Area: 32.1% % Reduction in Volume: -850.2% Epithelialization: None Tunneling: No Undermining: Yes Starting Position (o'clock): 12 Ending Position (o'clock): 5 Maximum Distance: (cm) 4 Wound  Description Classification: Category/Stage III Wound Margin: Well defined, not attached Exudate Amount: Medium Exudate Type: Serosanguineous Exudate Color: red, brown Foul Odor After Cleansing: No Slough/Fibrino Yes Wound Bed Granulation Amount: Large (67-100%) Exposed Structure Granulation Quality: Red Fascia Exposed: No Necrotic Amount: None Present (0%) Fat Layer (Subcutaneous Tissue) Exposed: Yes Tendon Exposed: No Muscle Exposed: No Joint Exposed: No Bone Exposed: No Electronic Signature(s) Signed: 12/08/2020 5:31:21 PM By: Baruch Gouty RN, BSN Entered By: Baruch Gouty on 12/08/2020 12:50:43 -------------------------------------------------------------------------------- [place] Details Patient Name: Date of Service: [name] 12/08/2020 12:30 PM Medical Record Number: [id number] Patient Account Number: [id number] Date of Birth/Sex: Treating RN: Jul 26, 1935 (85 y.o. [name] Primary Care Craig Wisnewski: [name] Other Clinician: Referring Nialah Saravia: Treating Rosaly Labarbera/Extender: Rolanda Lundborg in Treatment: 9 Vital Signs Time Taken: 12:52 Temperature (F): 98.5 Height (in): 68 Pulse (bpm): 85 Source: Stated Respiratory Rate (breaths/min): 18 Weight (lbs): 110 Blood Pressure (mmHg): 177/91 Source: Stated Capillary Blood Glucose (mg/dl): 120 Body Mass Index (BMI): 16.7 Reference Range: 80 - 120 mg / dl Notes glucose per family report this am Electronic Signature(s) Signed: 12/08/2020 5:31:21 PM By: Baruch Gouty RN, BSN Entered By: Baruch Gouty on 12/08/2020 12:53:36

## 2021-01-20 ENCOUNTER — Encounter (HOSPITAL_BASED_OUTPATIENT_CLINIC_OR_DEPARTMENT_OTHER): Payer: Medicare Other | Attending: Internal Medicine | Admitting: Internal Medicine

## 2021-01-20 ENCOUNTER — Other Ambulatory Visit: Payer: Self-pay

## 2021-01-20 DIAGNOSIS — E119 Type 2 diabetes mellitus without complications: Secondary | ICD-10-CM | POA: Diagnosis not present

## 2021-01-20 DIAGNOSIS — L89324 Pressure ulcer of left buttock, stage 4: Secondary | ICD-10-CM | POA: Diagnosis not present

## 2021-01-20 DIAGNOSIS — F0390 Unspecified dementia without behavioral disturbance: Secondary | ICD-10-CM | POA: Insufficient documentation

## 2021-01-20 DIAGNOSIS — L89323 Pressure ulcer of left buttock, stage 3: Secondary | ICD-10-CM | POA: Diagnosis not present

## 2021-01-20 DIAGNOSIS — I10 Essential (primary) hypertension: Secondary | ICD-10-CM | POA: Diagnosis not present

## 2021-01-20 DIAGNOSIS — Z7984 Long term (current) use of oral hypoglycemic drugs: Secondary | ICD-10-CM | POA: Insufficient documentation

## 2021-01-20 DIAGNOSIS — L8993 Pressure ulcer of unspecified site, stage 3: Secondary | ICD-10-CM | POA: Insufficient documentation

## 2021-01-20 DIAGNOSIS — E1151 Type 2 diabetes mellitus with diabetic peripheral angiopathy without gangrene: Secondary | ICD-10-CM | POA: Diagnosis not present

## 2021-01-20 DIAGNOSIS — A499 Bacterial infection, unspecified: Secondary | ICD-10-CM | POA: Diagnosis not present

## 2021-01-21 NOTE — Progress Notes (Signed)
[name] ([id number]) , Visit Report for 01/20/2021 Debridement Details Patient Name: Date of Service: [name], [name] 01/20/2021 9:30 A M Medical Record Number: [id number] Patient Account Number: [id number] Date of Birth/Sex: Treating RN: 27-Mar-1936 (84 y.o. [name], [name] Primary Care Provider: Sherrie Contreras Other Clinician: Referring Provider: Treating Provider/Extender: [name] in Treatment: 15 Debridement Performed for Assessment: Wound #2 Left Gluteus Performed By: Physician [name]., MD Debridement Type: Debridement Level of Consciousness (Pre-procedure): Awake and Alert Pre-procedure Verification/Time Out Yes - 10:44 Taken: Start Time: 10:45 Pain Control: Other : Benzocaine T Area Debrided (L x W): otal 5.5 (cm) x 4 (cm) = 22 (cm) Tissue and other material debrided: Non-Viable, Slough, Subcutaneous, Slough Level: Skin/Subcutaneous Tissue Debridement Description: Excisional Instrument: Curette Bleeding: Moderate Hemostasis Achieved: Pressure End Time: 10:50 Response to Treatment: Procedure was tolerated well Level of Consciousness (Post- Awake and Alert procedure): Post Debridement Measurements of Total Wound Length: (cm) 5.5 Stage: Category/Stage III Width: (cm) 4 Depth: (cm) 3 Volume: (cm) 51.836 Character of Wound/Ulcer Post Debridement: Stable Post Procedure Diagnosis Same as Pre-procedure Electronic Signature(s) Signed: 01/20/2021 4:44:54 PM By: Carolyn Ham MD Signed: 01/21/2021 4:34:42 PM By: Rhae Hammock RN Entered By: [name] on 01/20/2021 11:32:54 -------------------------------------------------------------------------------- HPI Details Patient Name: Date of Service: [name] 01/20/2021 9:30 A M Medical Record Number: [id number] Patient Account Number: [id number] Date of Birth/Sex: Treating RN: [birth year] (85 y.o. [name] Primary Care Provider: Sherrie Contreras  Other Clinician: Referring Provider: Treating Provider/Extender: [name] in Treatment: 15 History of Present Illness HPI Description: Admission 5/27 [name] is an 85 year old female with a past medical history of type 2 diabetes on oral agents, hypertension and dementia that presents today to the clinic for a wound to her right buttocks. Her daughter-in-law is present today and helps provide the history. This issue has been going on for 2 to 3 weeks now. She states she developed an eschar about 1 week ago. She has been taking doxycycline for possible soft tissue infection to the area prescribed by her primary care provider. Patient currently denies any signs of infection. 6/3; patient admitted to the clinic last week. She has a large necrotic area on her left buttock. They use Santyl last week. On arrival this visit she had completely necrotic surface with open to subcutaneous tissue. The surface was completely nonviable. They have been using Santyl. She apparently had been on doxycycline which she is just finishing prescribed by her primary doctor for possible soft tissue infection 6/7; patient with a large necrotic wound over her left buttock. Aggressive debridement last week. Culture of this grew Proteus unfortunately would not of been covered well or at least predictably by the doxycycline that her primary doctor put her on. I have therefore put her on Augmentin suspension 3 times a day. We are using silver alginate as the primary dressing while we deal with the infection. Her daughter has a list of concerns including swallowing difficulties, concerns about aspiration. The patient has advanced dementia. If this is Alzheimer's disease it is at its preterminal stages. I went over that swallowing difficulties are part of what happens in the preterminal stages of Alzheimer's disease. Nevertheless we will family members seems to want to pursue a very  aggressive course 6/21 large wound over her left buttock. She has completed the antibiotics I gave her [Augmentin]. We are using silver alginate while we are dealing with the infection and also to help with the  drainage In general the wound looks better she has healthy granulation over perhaps 70% of it. Superiorly there is still necrotic debris I did not attempt to debride this today The patient has advanced dementia. I do not know that she could tolerate a wound VAC. She also has double incontinence which would make that difficult. Nevertheless she has been cared for diligently by her family. She is apparently eating well and may be 2 to 3 hours on this area all day 7/5; 2-week follow-up. She continues with a nice improvement in overall condition of the wound bed. The undermining area has still some adherent surface slough. We have been using silver alginate because of the drainage and possibility of at least superficial wound infection/colonization. Although I said in previous notes I wondered whether she could tolerate the wound VAC they have done so well with this wound at home I do not want to necessarily rule her out for a wound VAC and I am going to direct the start of a trial of wound VAC therapy if this can be covered by insurance, if we have home health support to change it at all 7/19; we did not get very far with the wound VAC because my original ICD 10 said this was unstageable. As usual this represents a considerable frustration because we would not of ordered a wound VAC if the wound was still unstageable. This currently represents a stage IV staging. This is all the way down through muscle layer. There is a rim of tissue over the bone and no palpable exposed bone but this is a deep wound. Fortunately at present no evidence of infection. We have been using silver collagen with backing wet-to-dry. The wound is really no better today but no worse 8/2; patient presents for 2-week  follow-up. She has been using the wound VAC for the past week. There has been some issues with keeping the drape in place however home health comes out to reapply the drape when this happens. Overall there are no issues or complaints today. No signs of infection. 8/17; patient has been using the wound VAC on her wound on the upper left buttock. About 50 to 60% of this area is fully granulated however from about 10-5 o'clock there is still undermining. UNFORTUNATELY she also arrives in clinic today with a wound over her right fibular head. According to her daughter has been there for about 2 weeks. She wonders whether there is an abrasion although no concrete history of this. Given her frail status this is probably most likely a pressure ulcer over a bony prominence [fibular head] 8/31 unfortunately the upper left buttock pressure ulcer stage IV is not as optimistically better as I was hoping. The granulation that I identified on 12/23/20 is no longer adherent at the inferior pole the undermining is quite extensive but there is no palpable bone. No evidence of surrounding infection. There may be some improvement in the undermining measurements We are still dealing with the additional wound we identified 2 weeks ago on the right fibular head as well we have been using silver alginate here 9/14; 1 month follow-up. She has now had the wound VAC on for probably 2 months. No major change here if she still has the tunneling area at 12:00 and undermining from about 6-12. Surface of the wound does not look healthy. We use MolecuLight to look at the wound surface which actually looked negative however she has extensive immunofluorescence in the wound edge from about 12-6 o'clock  this actually extends beyond the visible wound margin by quite amount. Electronic Signature(s) Signed: 01/20/2021 4:44:54 PM By: Carolyn Ham MD Entered By: [name] on 01/20/2021  11:34:52 -------------------------------------------------------------------------------- Physical Exam Details Patient Name: Date of Service: [name], [name] 01/20/2021 9:30 A M Medical Record Number: [id number] Patient Account Number: [id number] Date of Birth/Sex: Treating RN: July 27, 1935 (85 y.o. [name] Primary Care Provider: Sherrie Contreras Other Clinician: Referring Provider: Treating Provider/Extender: [name] in Treatment: 15 Constitutional Patient is hypertensive.. Pulse regular and within target range for patient.Marland Kitchen Respirations regular, non-labored and within target range.. Temperature is normal and within the target range for the patient.Marland Kitchen Appears in no distress. Notes Wound exam; the wound in the buttock is not progressing. The granulation does not look viable. I used an open curette to extensively debride the very gritty fibrinous surface. I carefully looked at the wound edges specially where the MolecuLight suggested regimen immunofluorescence. I do not see any clear evidence of infection and I am unwilling to extend a significant surgical debridement to address this The area on the right fibular head is slightly hyper granulated no debridement here. Electronic Signature(s) Signed: 01/20/2021 4:44:54 PM By: Carolyn Ham MD Entered By: [name] on 01/20/2021 11:36:08 -------------------------------------------------------------------------------- Physician Orders Details Patient Name: Date of Service: [name], [name] 01/20/2021 9:30 A M Medical Record Number: [id number] Patient Account Number: [id number] Date of Birth/Sex: Treating RN: [birth year] (86 y.o. [name] Primary Care Provider: Sherrie Contreras Other Clinician: Referring Provider: Treating Provider/Extender: [name] in Treatment: 15 Verbal / Phone Orders: No Diagnosis Coding ICD-10 Coding Code Description [phone]  Pressure ulcer of left buttock, stage 4 E11.9 Type 2 diabetes mellitus without complications [id number] Pressure ulcer of unspecified site, stage 3 Follow-up Appointments ppointment in 2 weeks. - with Dr. Dellia Nims Return A Negative Presssure Wound Therapy Wound #2 Left Gluteus Discontinue wound vac - 01/20/21 Off-Loading Low air-loss mattress (Group 2) Turn and reposition every 2 hours Additional Orders / Instructions Follow Nutritious Diet - 100-120g of Protein Home Health New wound care orders this week; continue Home Health for wound care. May utilize formulary equivalent dressing for wound treatment orders unless otherwise specified. - DC Vac to gluteal wound Other Home Health Orders/Instructions: - [facility] Monday, Wednesday, and Friday Wound Treatment Wound #2 - Gluteus Wound Laterality: Left Cleanser: Soap and Water ([place]) 1 x Per Day/30 Days Discharge Instructions: May shower and wash wound with dial antibacterial soap and water prior to dressing change. Prim Dressing: KerraCel Ag Gelling Fiber Dressing, 4x5 in (silver alginate) (Home Health) 1 x Per Day/30 Days ary Discharge Instructions: Apply silver alginate to wound bed as instructed Secondary Dressing: Woven Gauze Sponge, Non-Sterile 4x4 in (Home Health) 1 x Per Day/30 Days Discharge Instructions: Dry gauze backing over alginate Secondary Dressing: ABD Pad, 8x10 (Home Health) 1 x Per Day/30 Days Discharge Instructions: Apply over primary dressing as directed. Secured With: 67M Medipore H Soft Cloth Surgical T 4 x 2 (in/yd) (Home Health) 1 x Per Day/30 Days ape Discharge Instructions: Secure dressing with tape as directed. Wound #3 - Lower Leg Wound Laterality: Right, Lateral Cleanser: Soap and Water [facility]) Every Other Day/30 Days Discharge Instructions: May shower and wash wound with dial antibacterial soap and water prior to dressing change. Cleanser: Wound Cleanser [facility]) Every Other Day/30  Days Discharge Instructions: Cleanse the wound with wound cleanser prior to applying a clean dressing using gauze sponges, not tissue or cotton balls. Prim Dressing:  Hydrofera Blue Ready Foam, 2.5 x2.5 in [facility]) Every Other Day/30 Days ary Discharge Instructions: Apply to wound bed as instructed Secondary Dressing: Zetuvit Plus Silicone Border Dressing 4x4 (in/in) ([place]) Every Other Day/30 Days Discharge Instructions: Apply silicone border over primary dressing as directed. Electronic Signature(s) Signed: 01/20/2021 4:44:54 PM By: Carolyn Ham MD Signed: 01/20/2021 5:11:07 PM By: Lorrin Jackson Entered By: Lorrin Jackson on 01/20/2021 10:56:32 -------------------------------------------------------------------------------- Problem List Details Patient Name: Date of Service: [name], [name] 01/20/2021 9:30 A M Medical Record Number: [id number] Patient Account Number: [id number] Date of Birth/Sex: Treating RN: Jun 18, 1935 (85 y.o. [name] Primary Care Provider: Sherrie Contreras Other Clinician: Referring Provider: Treating Provider/Extender: [name] in Treatment: 15 Active Problems ICD-10 Encounter Code Description Active Date MDM Diagnosis [id number] Pressure ulcer of left buttock, stage 4 11/24/2020 No Yes E11.9 Type 2 diabetes mellitus without complications 99991111 No Yes L89.93 Pressure ulcer of unspecified site, stage 3 12/23/2020 No Yes Inactive Problems ICD-10 Code Description Active Date Inactive Date L89.320 Pressure ulcer of left buttock, unstageable 10/13/2020 10/13/2020 Resolved Problems Electronic Signature(s) Signed: 01/20/2021 4:44:54 PM By: Carolyn Ham MD Entered By: [name] on 01/20/2021 11:32:30 -------------------------------------------------------------------------------- Progress Note Details Patient Name: Date of Service: [name] 01/20/2021 9:30 A M Medical Record Number:  [id number] Patient Account Number: [id number] Date of Birth/Sex: Treating RN: Sep 06, 1935 (85 y.o. [name] Primary Care Provider: Sherrie Contreras Other Clinician: Referring Provider: Treating Provider/Extender: [name] in Treatment: 15 Subjective History of Present Illness (HPI) Admission 5/27 Ms. Joylynn Newberg is an 85 year old female with a past medical history of type 2 diabetes on oral agents, hypertension and dementia that presents today to the clinic for a wound to her right buttocks. Her daughter-in-law is present today and helps provide the history. This issue has been going on for 2 to 3 weeks now. She states she developed an eschar about 1 week ago. She has been taking doxycycline for possible soft tissue infection to the area prescribed by her primary care provider. Patient currently denies any signs of infection. 6/3; patient admitted to the clinic last week. She has a large necrotic area on her left buttock. They use Santyl last week. On arrival this visit she had completely necrotic surface with open to subcutaneous tissue. The surface was completely nonviable. They have been using Santyl. She apparently had been on doxycycline which she is just finishing prescribed by her primary doctor for possible soft tissue infection 6/7; patient with a large necrotic wound over her left buttock. Aggressive debridement last week. Culture of this grew Proteus unfortunately would not of been covered well or at least predictably by the doxycycline that her primary doctor put her on. I have therefore put her on Augmentin suspension 3 times a day. We are using silver alginate as the primary dressing while we deal with the infection. Her daughter has a list of concerns including swallowing difficulties, concerns about aspiration. The patient has advanced dementia. If this is Alzheimer's disease it is at its preterminal stages. I went over that swallowing  difficulties are part of what happens in the preterminal stages of Alzheimer's disease. Nevertheless we will family members seems to want to pursue a very aggressive course 6/21 large wound over her left buttock. She has completed the antibiotics I gave her [Augmentin]. We are using silver alginate while we are dealing with the infection and also to help with the drainage In general the wound looks better she  has healthy granulation over perhaps 70% of it. Superiorly there is still necrotic debris I did not attempt to debride this today The patient has advanced dementia. I do not know that she could tolerate a wound VAC. She also has double incontinence which would make that difficult. Nevertheless she has been cared for diligently by her family. She is apparently eating well and may be 2 to 3 hours on this area all day 7/5; 2-week follow-up. She continues with a nice improvement in overall condition of the wound bed. The undermining area has still some adherent surface slough. We have been using silver alginate because of the drainage and possibility of at least superficial wound infection/colonization. Although I said in previous notes I wondered whether she could tolerate the wound VAC they have done so well with this wound at home I do not want to necessarily rule her out for a wound VAC and I am going to direct the start of a trial of wound VAC therapy if this can be covered by insurance, if we have home health support to change it at all 7/19; we did not get very far with the wound VAC because my original ICD 10 said this was unstageable. As usual this represents a considerable frustration because we would not of ordered a wound VAC if the wound was still unstageable. This currently represents a stage IV staging. This is all the way down through muscle layer. There is a rim of tissue over the bone and no palpable exposed bone but this is a deep wound. Fortunately at present no evidence of  infection. We have been using silver collagen with backing wet-to-dry. The wound is really no better today but no worse 8/2; patient presents for 2-week follow-up. She has been using the wound VAC for the past week. There has been some issues with keeping the drape in place however home health comes out to reapply the drape when this happens. Overall there are no issues or complaints today. No signs of infection. 8/17; patient has been using the wound VAC on her wound on the upper left buttock. About 50 to 60% of this area is fully granulated however from about 10-5 o'clock there is still undermining. UNFORTUNATELY she also arrives in clinic today with a wound over her right fibular head. According to her daughter has been there for about 2 weeks. She wonders whether there is an abrasion although no concrete history of this. Given her frail status this is probably most likely a pressure ulcer over a bony prominence [fibular head] 8/31 unfortunately the upper left buttock pressure ulcer stage IV is not as optimistically better as I was hoping. The granulation that I identified on 12/23/20 is no longer adherent at the inferior pole the undermining is quite extensive but there is no palpable bone. No evidence of surrounding infection. There may be some improvement in the undermining measurements We are still dealing with the additional wound we identified 2 weeks ago on the right fibular head as well we have been using silver alginate here 9/14; 1 month follow-up. She has now had the wound VAC on for probably 2 months. No major change here if she still has the tunneling area at 12:00 and undermining from about 6-12. Surface of the wound does not look healthy. We use MolecuLight to look at the wound surface which actually looked negative however she has extensive immunofluorescence in the wound edge from about 12-6 o'clock this actually extends beyond the visible wound margin  by  quite amount. Objective Constitutional Patient is hypertensive.. Pulse regular and within target range for patient.Marland Kitchen Respirations regular, non-labored and within target range.. Temperature is normal and within the target range for the patient.Marland Kitchen Appears in no distress. Vitals Time Taken: 10:27 AM, Height: 68 in, Weight: 110 lbs, BMI: 16.7, Temperature: 98.1 F, Pulse: 66 bpm, Respiratory Rate: 16 breaths/min, Blood Pressure: 183/90 mmHg, Capillary Blood Glucose: 163 mg/dl. General Notes: Wound exam; the wound in the buttock is not progressing. The granulation does not look viable. I used an open curette to extensively debride the very gritty fibrinous surface. I carefully looked at the wound edges specially where the MolecuLight suggested regimen immunofluorescence. I do not see any clear evidence of infection and I am unwilling to extend a significant surgical debridement to address this oo The area on the right fibular head is slightly hyper granulated no debridement here. Integumentary (Hair, Skin) Wound #2 status is Open. Original cause of wound was Pressure Injury. The date acquired was: 09/18/2020. The wound has been in treatment 15 weeks. The wound is located on the Left Gluteus. The wound measures 5.5cm length x 4cm width x 3cm depth; 17.279cm^2 area and 51.836cm^3 volume. There is Fat Layer (Subcutaneous Tissue) exposed. There is no tunneling noted, however, there is undermining starting at 11:00 and ending at 1:00 with a maximum distance of 4.2cm. There is a medium amount of serosanguineous drainage noted. The wound margin is thickened. There is large (67-100%) red granulation within the wound bed. There is a small (1-33%) amount of necrotic tissue within the wound bed including Adherent Slough. Wound #3 status is Open. Original cause of wound was Pressure Injury. The date acquired was: 12/16/2020. The wound has been in treatment 4 weeks. The wound is located on the Right,Lateral Lower  Leg. The wound measures 1.9cm length x 1.5cm width x 0.1cm depth; 2.238cm^2 area and 0.224cm^3 volume. There is Fat Layer (Subcutaneous Tissue) exposed. There is no tunneling or undermining noted. There is a medium amount of serosanguineous drainage noted. The wound margin is well defined and not attached to the wound base. There is medium (34-66%) pink, friable granulation within the wound bed. There is a medium (34-66%) amount of necrotic tissue within the wound bed including Adherent Slough. Assessment Active Problems ICD-10 Pressure ulcer of left buttock, stage 4 Type 2 diabetes mellitus without complications Pressure ulcer of unspecified site, stage 3 Procedures Wound #2 Pre-procedure diagnosis of Wound #2 is a Pressure Ulcer located on the Left Gluteus . There was a Excisional Skin/Subcutaneous Tissue Debridement with a total area of 22 sq cm performed by [name]., MD. With the following instrument(s): Curette to remove Non-Viable tissue/material. Material removed includes Subcutaneous Tissue and Slough and after achieving pain control using Other (Benzocaine). No specimens were taken. A time out was conducted at 10:44, prior to the start of the procedure. A Moderate amount of bleeding was controlled with Pressure. The procedure was tolerated well. Post Debridement Measurements: 5.5cm length x 4cm width x 3cm depth; 51.836cm^3 volume. Post debridement Stage noted as Category/Stage III. Character of Wound/Ulcer Post Debridement is stable. Post procedure Diagnosis Wound #2: Same as Pre-Procedure Plan Follow-up Appointments: Return Appointment in 2 weeks. - with Dr. Dellia Nims Negative Presssure Wound Therapy: Wound #2 Left Gluteus: Discontinue wound vac - 01/20/21 Off-Loading: Low air-loss mattress (Group 2) Turn and reposition every 2 hours Additional Orders / Instructions: Follow Nutritious Diet - 100-120g of Protein Home Health: New wound care orders this week; continue  Home Health for wound care. May utilize formulary equivalent dressing for wound treatment orders unless otherwise specified. - DC Vac to gluteal wound Other Home Health Orders/Instructions: - [facility] Monday, Wednesday, and Friday WOUND #2: - Gluteus Wound Laterality: Left Cleanser: Soap and Water ([place]) 1 x Per Day/30 Days Discharge Instructions: May shower and wash wound with dial antibacterial soap and water prior to dressing change. Prim Dressing: KerraCel Ag Gelling Fiber Dressing, 4x5 in (silver alginate) (Home Health) 1 x Per Day/30 Days ary Discharge Instructions: Apply silver alginate to wound bed as instructed Secondary Dressing: Woven Gauze Sponge, Non-Sterile 4x4 in (Home Health) 1 x Per Day/30 Days Discharge Instructions: Dry gauze backing over alginate Secondary Dressing: ABD Pad, 8x10 (Home Health) 1 x Per Day/30 Days Discharge Instructions: Apply over primary dressing as directed. Secured With: 56M Medipore H Soft Cloth Surgical T 4 x 2 (in/yd) (Home Health) 1 x Per Day/30 Days ape Discharge Instructions: Secure dressing with tape as directed. WOUND #3: - Lower Leg Wound Laterality: Right, Lateral Cleanser: Soap and Water [facility]) Every Other Day/30 Days Discharge Instructions: May shower and wash wound with dial antibacterial soap and water prior to dressing change. Cleanser: Wound Cleanser [facility]) Every Other Day/30 Days Discharge Instructions: Cleanse the wound with wound cleanser prior to applying a clean dressing using gauze sponges, not tissue or cotton balls. Prim Dressing: Hydrofera Blue Ready Foam, 2.5 x2.5 in [facility]) Every Other Day/30 Days ary Discharge Instructions: Apply to wound bed as instructed Secondary Dressing: Zetuvit Plus Silicone Border Dressing 4x4 (in/in) ([place]) Every Other Day/30 Days Discharge Instructions: Apply silicone border over primary dressing as directed. 1. I discontinued the wound VAC. We really have not had  any progress I think this is because of overall frailty, probably some degree of protein calorie malnutrition, difficulty offloading etc. We will switch the primary dressing to silver alginate change daily 2. I used the MolecuLight to look at the wound surface which did not really suggest any particular colonization although it did show intense immunofluorescence around the wound circumference and beyond and some areas. I was not willing to do an extensive debridement on this frail lady. 3. The area on the fibular head looks about the same slightly hyper granulated I switched the dressing here to Hydrofera Blue MolecuLight DX: 1st Scanned Wound The following wound was scanned with MolecuLight DX): gluteus Fluorescence bacterial imaging was medically necessary today due to Wound shows signs of deterioration such as increased drainage/slough/odor or (Indication): an increase in size MolecuLight Results Red Colors The indicated colors were noted in the following area(s). In the periphery of the wound As a result of todays scan, the following treatment plans were put in place. No adjustments in treatment MolecuLight DX: 2nd Scanned Wound As a result of todays scan, the following treatment plans were put in place. Silver alginate MolecuLight Procedure The MolecularLight DX device was cleaned with a disinfectant wipe prior to use., The correct patient profile was confirmed and correct wound was verified., Range finder sensor used to ensure appropriate distance selected The following was completed: between imaging unit and wound bed, Room lights were turned off and the ambient light sensor was checked., Blue circle appeared around the lightbulb., The fluorescence icon was selected. Screen was tapped to enhance focus and the image was captured. Additional drapes were used to ensure adequate darknesso No Potential ICD-10 Codes ICD-10 49.9 Bacterial infection unspecified (Red Color) Additional Scanned  Wounds Did you scan any additional Woundso  No Electronic Signature(s) Signed: 01/20/2021 4:44:54 PM By: Carolyn Ham MD Entered By: [name] on 01/20/2021 11:40:34 -------------------------------------------------------------------------------- SuperBill Details Patient Name: Date of Service: [name] 01/20/2021 Medical Record Number: [id number] Patient Account Number: [id number] Date of Birth/Sex: Treating RN: Nov 22, 1935 (85 y.o. [name] Primary Care Provider: Sherrie Contreras Other Clinician: Referring Provider: Treating Provider/Extender: [name] in Treatment: 15 Diagnosis Coding ICD-10 Codes Code Description [id number] Pressure ulcer of left buttock, stage 4 E11.9 Type 2 diabetes mellitus without complications [id number] Pressure ulcer of unspecified site, stage 3 Facility Procedures CPT4 Code: [id number] Description: [id number] - DEB SUBQ TISSUE 20 SQ CM/< ICD-10 Diagnosis Description L89.324 Pressure ulcer of left buttock, stage 4 Modifier: Quantity: 1 CPT4 Code: [id number] Description: [id number] - DEB SUBQ TISS EA ADDL 20CM ICD-10 Diagnosis Description L89.324 Pressure ulcer of left buttock, stage 4 Modifier: Quantity: 1 Physician Procedures : CPT4 Code Description Modifier [id number] - WC PHYS SUBQ TISS 20 SQ CM ICD-10 Diagnosis Description L89.324 Pressure ulcer of left buttock, stage 4 Quantity: 1 : [id number] - WC PHYS SUBQ TISS EA ADDL 20 CM ICD-10 Diagnosis Description L89.324 Pressure ulcer of left buttock, stage 4 Quantity: 1 : [id number] NONCNTACT RT FLORO WND 1ST STE 59 ICD-10 Diagnosis Description L89.324 Pressure ulcer of left buttock, stage 4 Quantity: 1 Electronic Signature(s) Signed: 01/20/2021 4:44:54 PM By: Carolyn Ham MD Entered By: [name] on 01/20/2021 11:41:01

## 2021-01-21 NOTE — Progress Notes (Signed)
[name] ([id number]) , Visit Report for 01/20/2021 Arrival Information Details Patient Name: Date of Service: [name], [name] 01/20/2021 9:30 A M Medical Record Number: [id number] Patient Account Number: [id number] Date of Birth/Sex: Treating RN: 02/11/1936 (85 y.o. F) Barnhart, Jodi Primary Care Marveen Donlon: Eubanks, Jessica Other Clinician: Referring Jontavia Leatherbury: Treating Majestic Brister/Extender: Robson, Michael Eubanks, Jessica Weeks in Treatment: 15 Visit Information History Since Last Visit Added or deleted any medications: No Patient Arrived: Wheel Chair Any new allergies or adverse reactions: No Arrival Time: 10:26 Had a fall or experienced change in No Transfer Assistance: Manual activities of daily living that may affect Patient Identification Verified: Yes risk of falls: Secondary Verification Process Completed: Yes Signs or symptoms of abuse/neglect since last visito No Patient Requires Transmission-Based Precautions: No Hospitalized since last visit: No Patient Has Alerts: No Implantable device outside of the clinic excluding No cellular tissue based products placed in the center since last visit: Has Dressing in Place as Prescribed: Yes Pain Present Now: No Electronic Signature(s) Signed: 01/20/2021 5:11:07 PM By: Barnhart, Jodi Entered By: Barnhart, Jodi on 01/20/2021 10:27:04 -------------------------------------------------------------------------------- Encounter Discharge Information Details Patient Name: Date of Service: [name], [name] 01/20/2021 9:30 A M Medical Record Number: [id number] Patient Account Number: [id number] Date of Birth/Sex: Treating RN: 10/09/1935 (85 y.o. F) Barnhart, Jodi Primary Care Hadja Harral: Eubanks, Jessica Other Clinician: Referring Jentry Mcqueary: Treating Leslee Haueter/Extender: Robson, Michael Eubanks, Jessica Weeks in Treatment: 15 Encounter Discharge Information Items Post Procedure Vitals Discharge Condition: Stable Temperature (F):  98.1 Ambulatory Status: Wheelchair Pulse (bpm): 66 Discharge Destination: Home Respiratory Rate (breaths/min): 16 Transportation: Private Auto Blood Pressure (mmHg): 183/90 Schedule Follow-up Appointment: Yes Clinical Summary of Care: Provided on 01/20/2021 Form Type Recipient Paper Patient Patient Electronic Signature(s) Signed: 01/20/2021 5:11:07 PM By: Barnhart, Jodi Entered By: Barnhart, Jodi on 01/20/2021 10:57:57 -------------------------------------------------------------------------------- Lower Extremity Assessment Details Patient Name: Date of Service: [name], [name] 01/20/2021 9:30 A M Medical Record Number: [id number] Patient Account Number: [id number] Date of Birth/Sex: Treating RN: 09/15/1935 (85 y.o. F) Barnhart, Jodi Primary Care Jimesha Rising: Eubanks, Jessica Other Clinician: Referring Gotham Raden: Treating Nechelle Petrizzo/Extender: Robson, Michael Eubanks, Jessica Weeks in Treatment: 15 Edema Assessment Assessed: [Left: No] [Right: Yes] Edema: [Left: N] [Right: o] Vascular Assessment Pulses: Dorsalis Pedis Palpable: [Right:Yes] Electronic Signature(s) Signed: 01/20/2021 5:11:07 PM By: Barnhart, Jodi Entered By: Barnhart, Jodi on 01/20/2021 10:28:12 -------------------------------------------------------------------------------- Multi Wound Chart Details Patient Name: Date of Service: [name], [name] 01/20/2021 9:30 A M Medical Record Number: [id number] Patient Account Number: [id number] Date of Birth/Sex: Treating RN: 01/27/1936 (85 y.o. F) Breedlove, Lauren Primary Care Darcella Shiffman: Eubanks, Jessica Other Clinician: Referring Sherell Christoffel: Treating Fleurette Woolbright/Extender: Robson, Michael Eubanks, Jessica Weeks in Treatment: 15 Vital Signs Height(in): 68 Capillary Blood Glucose(mg/dl): 163 Weight(lbs): 110 Pulse(bpm): 66 Body Mass Index(BMI): 17 Blood Pressure(mmHg): 183/90 Temperature(°F): 98.1 Respiratory Rate(breaths/min): 16 Photos: [N/A:N/A] Left Gluteus Right,  Lateral Lower Leg N/A Wound Location: Pressure Injury Pressure Injury N/A Wounding Event: Pressure Ulcer Pressure Ulcer N/A Primary Etiology: Anemia, Deep Vein Thrombosis, Anemia, Deep Vein Thrombosis, N/A Comorbid History: Hypertension, Type II Diabetes, Hypertension, Type II Diabetes, Dementia Dementia 09/18/2020 12/16/2020 N/A Date Acquired: 15 4 N/A Weeks of Treatment: Open Open N/A Wound Status: 5.5x4x3 1.9x1.5x0.1 N/A Measurements L x W x D (cm) 17.279 2.238 N/A A (cm) : rea 51.836 0.224 N/A Volume (cm) : 58.50% -11.70% N/A % Reduction in A rea: -1144.30% 62.70% N/A % Reduction in Volume: 11 Starting Position 1 (o'clock): 1 Ending Position 1 (o'clock): 4.2 Maximum Distance 1 (cm): Yes No N/A Undermining: Category/Stage III Category/Stage   III N/A Classification: Medium Medium N/A Exudate A mount: Serosanguineous Serosanguineous N/A Exudate Type: red, brown red, brown N/A Exudate Color: Thickened Well defined, not attached N/A Wound Margin: Large (67-100%) Medium (34-66%) N/A Granulation A mount: Red Pink, Friable N/A Granulation Quality: Small (1-33%) Medium (34-66%) N/A Necrotic A mount: Fat Layer (Subcutaneous Tissue): Yes Fat Layer (Subcutaneous Tissue): Yes N/A Exposed Structures: Fascia: No Fascia: No Tendon: No Tendon: No Muscle: No Muscle: No Joint: No Joint: No Bone: No Bone: No Medium (34-66%) Small (1-33%) N/A Epithelialization: Debridement - Excisional N/A N/A Debridement: Pre-procedure Verification/Time Out 10:44 N/A N/A Taken: Other N/A N/A Pain Control: Subcutaneous, Slough N/A N/A Tissue Debrided: Skin/Subcutaneous Tissue N/A N/A Level: 22 N/A N/A Debridement A (sq cm): rea Curette N/A N/A Instrument: Moderate N/A N/A Bleeding: Pressure N/A N/A Hemostasis A chieved: Procedure was tolerated well N/A N/A Debridement Treatment Response: 5.5x4x3 N/A N/A Post Debridement Measurements L x W x D (cm) 51.836 N/A  N/A Post Debridement Volume: (cm) Category/Stage III N/A N/A Post Debridement Stage: Debridement N/A N/A Procedures Performed: Treatment Notes Wound #2 (Gluteus) Wound Laterality: Left Cleanser Soap and Water Discharge Instruction: May shower and wash wound with dial antibacterial soap and water prior to dressing change. Peri-Wound Care Topical Primary Dressing KerraCel Ag Gelling Fiber Dressing, 4x5 in (silver alginate) Discharge Instruction: Apply silver alginate to wound bed as instructed Secondary Dressing Woven Gauze Sponge, Non-Sterile 4x4 in Discharge Instruction: Dry gauze backing over alginate ABD Pad, 8x10 Discharge Instruction: Apply over primary dressing as directed. Secured With 71M Medipore H Soft Cloth Surgical T 4 x 2 (in/yd) ape Discharge Instruction: Secure dressing with tape as directed. Compression Wrap Compression Stockings Add-Ons Wound #3 (Lower Leg) Wound Laterality: Right, Lateral Cleanser Soap and Water Discharge Instruction: May shower and wash wound with dial antibacterial soap and water prior to dressing change. Wound Cleanser Discharge Instruction: Cleanse the wound with wound cleanser prior to applying a clean dressing using gauze sponges, not tissue or cotton balls. Peri-Wound Care Topical Primary Dressing Hydrofera Blue Ready Foam, 2.5 x2.5 in Discharge Instruction: Apply to wound bed as instructed Secondary Dressing Zetuvit Plus Silicone Border Dressing 4x4 (in/in) Discharge Instruction: Apply silicone border over primary dressing as directed. Secured With Compression Wrap Compression Stockings Environmental education officer) Signed: 01/20/2021 4:44:54 PM By: Linton Ham MD Signed: 01/21/2021 4:34:42 PM By: Rhae Hammock RN Entered By: Linton Ham on 01/20/2021 11:32:40 -------------------------------------------------------------------------------- Multi-Disciplinary Care Plan Details Patient Name: Date of  Service: [place] 01/20/2021 9:30 A M Medical Record Number: [id number] Patient Account Number: [id number] Date of Birth/Sex: Treating RN: Apr 14, 1936 (85 y.o. [name] Primary Care Dusten Ellinwood: [name] Other Clinician: Referring Juliyah Mergen: Treating Esequiel Kleinfelter/Extender: Deloria Lair in Treatment: 15 Active Inactive Wound/Skin Impairment Nursing Diagnoses: Impaired tissue integrity Goals: Patient/caregiver will verbalize understanding of skin care regimen Date Initiated: 10/02/2020 Target Resolution Date: 02/05/2021 Goal Status: Active Ulcer/skin breakdown will have a volume reduction of 30% by week 4 Date Initiated: 10/02/2020 Date Inactivated: 01/06/2021 Target Resolution Date: 01/09/2021 Goal Status: Met Interventions: Assess patient/caregiver ability to obtain necessary supplies Assess patient/caregiver ability to perform ulcer/skin care regimen upon admission and as needed Assess ulceration(s) every visit Provide education on ulcer and skin care Treatment Activities: Skin care regimen initiated : 10/02/2020 Topical wound management initiated : 10/02/2020 Notes: 11/10/20 : Goal not yet met, eschar debrided off increasing wound size. Electronic Signature(s) Signed: 01/20/2021 5:11:07 PM By: Lorrin Jackson Signed: 01/20/2021 5:11:07 PM By: Lorrin Jackson Entered By: Lorrin Jackson  on 01/20/2021 10:30:59 -------------------------------------------------------------------------------- Pain Assessment Details Patient Name: Date of Service: [name], [name] 01/20/2021 9:30 A M Medical Record Number: [id number] Patient Account Number: [id number] Date of Birth/Sex: Treating RN: 04/01/1936 (85 y.o. F) Barnhart, Jodi Primary Care : Eubanks, Jessica Other Clinician: Referring : Treating /Extender: Robson, Michael Eubanks, Jessica Weeks in Treatment: 15 Active Problems Location of Pain Severity and Description of  Pain Patient Has Paino No Site Locations Pain Management and Medication Current Pain Management: Electronic Signature(s) Signed: 01/20/2021 5:11:07 PM By: Barnhart, Jodi Entered By: Barnhart, Jodi on 01/20/2021 10:27:51 -------------------------------------------------------------------------------- Patient/Caregiver Education Details Patient Name: Date of Service: [name], [name] 9/14/2022andnbsp9:30 A M Medical Record Number: [id number] Patient Account Number: [id number] Date of Birth/Gender: Treating RN: 12/21/1935 (85 y.o. F) Barnhart, Jodi Primary Care Physician: Eubanks, Jessica Other Clinician: Referring Physician: Treating Physician/Extender: Robson, Michael Eubanks, Jessica Weeks in Treatment: 15 Education Assessment Education Provided To: Patient Education Topics Provided Pressure: Methods: Explain/Verbal, Printed Responses: State content correctly Wound/Skin Impairment: Methods: Explain/Verbal, Printed Responses: State content correctly Electronic Signature(s) Signed: 01/20/2021 5:11:07 PM By: Barnhart, Jodi Entered By: Barnhart, Jodi on 01/20/2021 10:31:24 -------------------------------------------------------------------------------- Wound Assessment Details Patient Name: Date of Service: [name], [name] 01/20/2021 9:30 A M Medical Record Number: [id number] Patient Account Number: [id number] Date of Birth/Sex: Treating RN: 02/11/1936 (85 y.o. F) Barnhart, Jodi Primary Care : Eubanks, Jessica Other Clinician: Referring : Treating /Extender: Robson, Michael Eubanks, Jessica Weeks in Treatment: 15 Wound Status Wound Number: 2 Primary Pressure Ulcer Etiology: Wound Location: Left Gluteus Wound Status: Open Wounding Event: Pressure Injury Comorbid Anemia, Deep Vein Thrombosis, Hypertension, Type II Date Acquired: 09/18/2020 History: Diabetes, Dementia Weeks Of Treatment: 15 Clustered Wound: No Photos Wound Measurements Length:  (cm) 5.5 Width: (cm) 4 Depth: (cm) 3 Area: (cm) 17.279 Volume: (cm) 51.836 % Reduction in Area: 58.5% % Reduction in Volume: -1144.3% Epithelialization: Medium (34-66%) Tunneling: No Undermining: Yes Starting Position (o'clock): 11 Ending Position (o'clock): 1 Maximum Distance: (cm) 4.2 Wound Description Classification: Category/Stage III Wound Margin: Thickened Exudate Amount: Medium Exudate Type: Serosanguineous Exudate Color: red, brown Foul Odor After Cleansing: No Slough/Fibrino Yes Wound Bed Granulation Amount: Large (67-100%) Exposed Structure Granulation Quality: Red Fascia Exposed: No Necrotic Amount: Small (1-33%) Fat Layer (Subcutaneous Tissue) Exposed: Yes Necrotic Quality: Adherent Slough Tendon Exposed: No Muscle Exposed: No Joint Exposed: No Bone Exposed: No Treatment Notes Wound #2 (Gluteus) Wound Laterality: Left Cleanser Soap and Water Discharge Instruction: May shower and wash wound with dial antibacterial soap and water prior to dressing change. Peri-Wound Care Topical Primary Dressing KerraCel Ag Gelling Fiber Dressing, 4x5 in (silver alginate) Discharge Instruction: Apply silver alginate to wound bed as instructed Secondary Dressing Woven Gauze Sponge, Non-Sterile 4x4 in Discharge Instruction: Dry gauze backing over alginate ABD Pad, 8x10 Discharge Instruction: Apply over primary dressing as directed. Secured With 3M Medipore H Soft Cloth Surgical T 4 x 2 (in/yd) ape Discharge Instruction: Secure dressing with tape as directed. Compression Wrap Compression Stockings Add-Ons Electronic Signature(s) Signed: 01/20/2021 5:11:07 PM By: Barnhart, Jodi Signed: 01/21/2021 4:34:42 PM By: Breedlove, Lauren RN Entered By: Breedlove, Lauren on 01/20/2021 12:21:30 -------------------------------------------------------------------------------- Wound Assessment Details Patient Name: Date of Service: [name], [name] 01/20/2021 9:30 A M Medical  Record Number: [id number] Patient Account Number: [id number] Date of Birth/Sex: Treating RN: 07/09/1935 (85 y.o. F) Barnhart, Jodi Primary Care : Eubanks, Jessica Other Clinician: Referring : Treating /Extender: Robson, Michael Eubanks, Jessica Weeks in Treatment: 15 Wound Status Wound Number: 3 Primary Pressure Ulcer Etiology: Wound Location: Right, Lateral Lower Leg   Wound Status: Open Wounding Event: Pressure Injury Comorbid Anemia, Deep Vein Thrombosis, Hypertension, Type II Date Acquired: 12/16/2020 History: Diabetes, Dementia Weeks Of Treatment: 4 Clustered Wound: No Photos Wound Measurements Length: (cm) 1.9 Width: (cm) 1.5 Depth: (cm) 0.1 Area: (cm) 2.238 Volume: (cm) 0.224 % Reduction in Area: -11.7% % Reduction in Volume: 62.7% Epithelialization: Small (1-33%) Tunneling: No Undermining: No Wound Description Classification: Category/Stage III Wound Margin: Well defined, not attached Exudate Amount: Medium Exudate Type: Serosanguineous Exudate Color: red, brown Foul Odor After Cleansing: No Slough/Fibrino Yes Wound Bed Granulation Amount: Medium (34-66%) Exposed Structure Granulation Quality: Pink, Friable Fascia Exposed: No Necrotic Amount: Medium (34-66%) Fat Layer (Subcutaneous Tissue) Exposed: Yes Necrotic Quality: Adherent Slough Tendon Exposed: No Muscle Exposed: No Joint Exposed: No Bone Exposed: No Treatment Notes Wound #3 (Lower Leg) Wound Laterality: Right, Lateral Cleanser Soap and Water Discharge Instruction: May shower and wash wound with dial antibacterial soap and water prior to dressing change. Wound Cleanser Discharge Instruction: Cleanse the wound with wound cleanser prior to applying a clean dressing using gauze sponges, not tissue or cotton balls. Peri-Wound Care Topical Primary Dressing Hydrofera Blue Ready Foam, 2.5 x2.5 in Discharge Instruction: Apply to wound bed as instructed Secondary  Dressing Zetuvit Plus Silicone Border Dressing 4x4 (in/in) Discharge Instruction: Apply silicone border over primary dressing as directed. Secured With Compression Wrap Compression Stockings Environmental education officer) Signed: 01/20/2021 5:11:07 PM By: Lorrin Jackson Entered By: Lorrin Jackson on 01/20/2021 10:30:33 -------------------------------------------------------------------------------- Vitals Details Patient Name: Date of Service: [name] 01/20/2021 9:30 A M Medical Record Number: [id number] Patient Account Number: [id number] Date of Birth/Sex: Treating RN: Sep 10, 1935 (85 y.o. [name] Primary Care Tomeca Helm: [name] Other Clinician: Referring Myranda Pavone: Treating Charlette Hennings/Extender: Deloria Lair in Treatment: 15 Vital Signs Time Taken: 10:27 Temperature (F): 98.1 Height (in): 68 Pulse (bpm): 66 Weight (lbs): 110 Respiratory Rate (breaths/min): 16 Body Mass Index (BMI): 16.7 Blood Pressure (mmHg): 183/90 Capillary Blood Glucose (mg/dl): 163 Reference Range: 80 - 120 mg / dl Electronic Signature(s) Signed: 01/20/2021 5:11:07 PM By: Lorrin Jackson Entered By: Lorrin Jackson on 01/20/2021 10:27:44

## 2021-01-22 DIAGNOSIS — F0390 Unspecified dementia without behavioral disturbance: Secondary | ICD-10-CM | POA: Diagnosis not present

## 2021-01-22 DIAGNOSIS — E785 Hyperlipidemia, unspecified: Secondary | ICD-10-CM | POA: Diagnosis not present

## 2021-01-22 DIAGNOSIS — L89320 Pressure ulcer of left buttock, unstageable: Secondary | ICD-10-CM | POA: Diagnosis not present

## 2021-01-22 DIAGNOSIS — I890 Lymphedema, not elsewhere classified: Secondary | ICD-10-CM | POA: Diagnosis not present

## 2021-01-22 DIAGNOSIS — Z7984 Long term (current) use of oral hypoglycemic drugs: Secondary | ICD-10-CM | POA: Diagnosis not present

## 2021-01-22 DIAGNOSIS — E119 Type 2 diabetes mellitus without complications: Secondary | ICD-10-CM | POA: Diagnosis not present

## 2021-01-25 DIAGNOSIS — Z7984 Long term (current) use of oral hypoglycemic drugs: Secondary | ICD-10-CM | POA: Diagnosis not present

## 2021-01-25 DIAGNOSIS — L89320 Pressure ulcer of left buttock, unstageable: Secondary | ICD-10-CM | POA: Diagnosis not present

## 2021-01-25 DIAGNOSIS — I890 Lymphedema, not elsewhere classified: Secondary | ICD-10-CM | POA: Diagnosis not present

## 2021-01-25 DIAGNOSIS — E785 Hyperlipidemia, unspecified: Secondary | ICD-10-CM | POA: Diagnosis not present

## 2021-01-25 DIAGNOSIS — E119 Type 2 diabetes mellitus without complications: Secondary | ICD-10-CM | POA: Diagnosis not present

## 2021-01-25 DIAGNOSIS — F0390 Unspecified dementia without behavioral disturbance: Secondary | ICD-10-CM | POA: Diagnosis not present

## 2021-01-27 DIAGNOSIS — E785 Hyperlipidemia, unspecified: Secondary | ICD-10-CM | POA: Diagnosis not present

## 2021-01-27 DIAGNOSIS — M6281 Muscle weakness (generalized): Secondary | ICD-10-CM | POA: Diagnosis not present

## 2021-01-27 DIAGNOSIS — L89320 Pressure ulcer of left buttock, unstageable: Secondary | ICD-10-CM | POA: Diagnosis not present

## 2021-01-27 DIAGNOSIS — K5900 Constipation, unspecified: Secondary | ICD-10-CM | POA: Diagnosis not present

## 2021-01-27 DIAGNOSIS — Z8673 Personal history of transient ischemic attack (TIA), and cerebral infarction without residual deficits: Secondary | ICD-10-CM | POA: Diagnosis not present

## 2021-01-27 DIAGNOSIS — L89893 Pressure ulcer of other site, stage 3: Secondary | ICD-10-CM | POA: Diagnosis not present

## 2021-01-27 DIAGNOSIS — E119 Type 2 diabetes mellitus without complications: Secondary | ICD-10-CM | POA: Diagnosis not present

## 2021-01-27 DIAGNOSIS — Z7901 Long term (current) use of anticoagulants: Secondary | ICD-10-CM | POA: Diagnosis not present

## 2021-01-27 DIAGNOSIS — I890 Lymphedema, not elsewhere classified: Secondary | ICD-10-CM | POA: Diagnosis not present

## 2021-01-27 DIAGNOSIS — Z7984 Long term (current) use of oral hypoglycemic drugs: Secondary | ICD-10-CM | POA: Diagnosis not present

## 2021-01-27 DIAGNOSIS — F0390 Unspecified dementia without behavioral disturbance: Secondary | ICD-10-CM | POA: Diagnosis not present

## 2021-01-27 DIAGNOSIS — I10 Essential (primary) hypertension: Secondary | ICD-10-CM | POA: Diagnosis not present

## 2021-01-27 DIAGNOSIS — Z86718 Personal history of other venous thrombosis and embolism: Secondary | ICD-10-CM | POA: Diagnosis not present

## 2021-01-27 DIAGNOSIS — Z86711 Personal history of pulmonary embolism: Secondary | ICD-10-CM | POA: Diagnosis not present

## 2021-01-27 DIAGNOSIS — Z741 Need for assistance with personal care: Secondary | ICD-10-CM | POA: Diagnosis not present

## 2021-01-27 DIAGNOSIS — G609 Hereditary and idiopathic neuropathy, unspecified: Secondary | ICD-10-CM | POA: Diagnosis not present

## 2021-02-02 ENCOUNTER — Other Ambulatory Visit: Payer: Self-pay

## 2021-02-02 ENCOUNTER — Encounter (HOSPITAL_BASED_OUTPATIENT_CLINIC_OR_DEPARTMENT_OTHER): Payer: Medicare Other | Admitting: Internal Medicine

## 2021-02-02 DIAGNOSIS — F0390 Unspecified dementia without behavioral disturbance: Secondary | ICD-10-CM | POA: Diagnosis not present

## 2021-02-02 DIAGNOSIS — L89324 Pressure ulcer of left buttock, stage 4: Secondary | ICD-10-CM | POA: Diagnosis not present

## 2021-02-02 DIAGNOSIS — E119 Type 2 diabetes mellitus without complications: Secondary | ICD-10-CM | POA: Diagnosis not present

## 2021-02-02 DIAGNOSIS — I10 Essential (primary) hypertension: Secondary | ICD-10-CM | POA: Diagnosis not present

## 2021-02-02 DIAGNOSIS — Z7984 Long term (current) use of oral hypoglycemic drugs: Secondary | ICD-10-CM | POA: Diagnosis not present

## 2021-02-02 DIAGNOSIS — L8993 Pressure ulcer of unspecified site, stage 3: Secondary | ICD-10-CM | POA: Diagnosis not present

## 2021-02-02 DIAGNOSIS — L89893 Pressure ulcer of other site, stage 3: Secondary | ICD-10-CM | POA: Diagnosis not present

## 2021-02-04 NOTE — Progress Notes (Signed)
[name] ([id number]) , Visit Report for 02/02/2021 Arrival Information Details Patient Name: Date of Service: Carolyn, Contreras 02/02/2021 9:45 A M Medical Record Number: [id number] Patient Account Number: [id number] Date of Birth/Sex: Treating RN: 30-Oct-1935 (85 y.o. [name] Primary Care Perseus Westall: [name] Other Clinician: Referring Joycelyn Liska: Treating Niomi Valent/Extender: Deloria Lair in Treatment: 44 Visit Information History Since Last Visit Added or deleted any medications: No Patient Arrived: Wheel Chair Any new allergies or adverse reactions: No Arrival Time: 09:45 Had a fall or experienced change in No Accompanied By: caregiver activities of daily living that may affect Transfer Assistance: Manual risk of falls: Patient Identification Verified: Yes Signs or symptoms of abuse/neglect since last visito No Secondary Verification Process Completed: Yes Hospitalized since last visit: No Patient Requires Transmission-Based Precautions: No Implantable device outside of the clinic excluding No Patient Has Alerts: No cellular tissue based products placed in the center since last visit: Has Dressing in Place as Prescribed: Yes Pain Present Now: No Electronic Signature(s) Signed: 02/04/2021 4:49:13 PM By: Levan Hurst RN, BSN Entered By: Levan Hurst on 02/02/2021 09:45:30 -------------------------------------------------------------------------------- Clinic Level of Care Assessment Details Patient Name: Date of Service: Contreras, Carolyn 02/02/2021 9:45 A M Medical Record Number: [id number] Patient Account Number: [id number] Date of Birth/Sex: Treating RN: 01-14-1936 (85 y.o. [name] Primary Care Maiko Salais: [name] Other Clinician: Referring Emran Molzahn: Treating Glenola Wheat/Extender: Deloria Lair in Treatment: 17 Clinic Level of Care Assessment Items TOOL 4 Quantity Score X- 1 0 Use when  only an EandM is performed on FOLLOW-UP visit ASSESSMENTS - Nursing Assessment / Reassessment X- 1 10 Reassessment of Co-morbidities (includes updates in patient status) X- 1 5 Reassessment of Adherence to Treatment Plan ASSESSMENTS - Wound and Skin A ssessment / Reassessment []  - 0 Simple Wound Assessment / Reassessment - one wound X- 2 5 Complex Wound Assessment / Reassessment - multiple wounds []  - 0 Dermatologic / Skin Assessment (not related to wound area) ASSESSMENTS - Focused Assessment []  - 0 Circumferential Edema Measurements - multi extremities []  - 0 Nutritional Assessment / Counseling / Intervention []  - 0 Lower Extremity Assessment (monofilament, tuning fork, pulses) []  - 0 Peripheral Arterial Disease Assessment (using hand held doppler) ASSESSMENTS - Ostomy and/or Continence Assessment and Care []  - 0 Incontinence Assessment and Management []  - 0 Ostomy Care Assessment and Management (repouching, etc.) PROCESS - Coordination of Care X - Simple Patient / Family Education for ongoing care 1 15 []  - 0 Complex (extensive) Patient / Family Education for ongoing care X- 1 10 Staff obtains Consents, Records, T Results / Process Orders est X- 1 10 Staff telephones HHA, Nursing Homes / Clarify orders / etc []  - 0 Routine Transfer to another Facility (non-emergent condition) []  - 0 Routine Hospital Admission (non-emergent condition) []  - 0 New Admissions / Biomedical engineer / Ordering NPWT Apligraf, etc. , []  - 0 Emergency Hospital Admission (emergent condition) X- 1 10 Simple Discharge Coordination []  - 0 Complex (extensive) Discharge Coordination PROCESS - Special Needs []  - 0 Pediatric / Minor Patient Management []  - 0 Isolation Patient Management []  - 0 Hearing / Language / Visual special needs []  - 0 Assessment of Community assistance (transportation, D/C planning, etc.) []  - 0 Additional assistance / Altered mentation []  - 0 Support  Surface(s) Assessment (bed, cushion, seat, etc.) INTERVENTIONS - Wound Cleansing / Measurement []  - 0 Simple Wound Cleansing - one wound X- 2 5 Complex Wound Cleansing - multiple wounds X-  1 5 Wound Imaging (photographs - any number of wounds) []  - 0 Wound Tracing (instead of photographs) []  - 0 Simple Wound Measurement - one wound X- 2 5 Complex Wound Measurement - multiple wounds INTERVENTIONS - Wound Dressings X - Small Wound Dressing one or multiple wounds 1 10 X- 1 15 Medium Wound Dressing one or multiple wounds []  - 0 Large Wound Dressing one or multiple wounds []  - 0 Application of Medications - topical []  - 0 Application of Medications - injection INTERVENTIONS - Miscellaneous []  - 0 External ear exam []  - 0 Specimen Collection (cultures, biopsies, blood, body fluids, etc.) []  - 0 Specimen(s) / Culture(s) sent or taken to Lab for analysis []  - 0 Patient Transfer (multiple staff / Civil Service fast streamer / Similar devices) []  - 0 Simple Staple / Suture removal (25 or less) []  - 0 Complex Staple / Suture removal (26 or more) []  - 0 Hypo / Hyperglycemic Management (close monitor of Blood Glucose) []  - 0 Ankle / Brachial Index (ABI) - do not check if billed separately X- 1 5 Vital Signs Has the patient been seen at the hospital within the last three years: Yes Total Score: 125 Level Of Care: New/Established - Level 4 Electronic Signature(s) Signed: 02/04/2021 4:49:13 PM By: Levan Hurst RN, BSN Entered By: Levan Hurst on 02/02/2021 11:06:49 -------------------------------------------------------------------------------- Encounter Discharge Information Details Patient Name: Date of Service: [name] 02/02/2021 9:45 A M Medical Record Number: [id number] Patient Account Number: [id number] Date of Birth/Sex: Treating RN: 1935-08-27 (85 y.o. [name] Primary Care Danitza Schoenfeldt: [name] Other Clinician: Referring Norfleet Capers: Treating Yuvraj Pfeifer/Extender:  Deloria Lair in Treatment: 17 Encounter Discharge Information Items Discharge Condition: Stable Ambulatory Status: Wheelchair Discharge Destination: Home Transportation: Private Auto Accompanied By: caregiver Schedule Follow-up Appointment: Yes Clinical Summary of Care: Patient Declined Notes BP 180/94 prior to discharge. MD notified. Electronic Signature(s) Signed: 02/04/2021 4:49:13 PM By: Levan Hurst RN, BSN Entered By: Levan Hurst on 02/02/2021 11:33:23 -------------------------------------------------------------------------------- Multi Wound Chart Details Patient Name: Date of Service: [name] 02/02/2021 9:45 A M Medical Record Number: [id number] Patient Account Number: [id number] Date of Birth/Sex: Treating RN: [birth year] (85 y.o. [name], Lauren Primary Care Marquavius Scaife: [name] Other Clinician: Referring Alexxa Sabet: Treating Tanveer Dobberstein/Extender: Deloria Lair in Treatment: 17 Vital Signs Height(in): 68 Capillary Blood Glucose(mg/dl): 125 Weight(lbs): 110 Pulse(bpm): 18 Body Mass Index(BMI): 17 Blood Pressure(mmHg): 184/110 Temperature(F): 97.8 Respiratory Rate(breaths/min): 16 Photos: [2:Left Gluteus] [3:Right, Lateral Lower Leg] [N/A:N/A N/A] Wound Location: [2:Pressure Injury] [3:Pressure Injury] [N/A:N/A] Wounding Event: [2:Pressure Ulcer] [3:Pressure Ulcer] [N/A:N/A] Primary Etiology: [2:Anemia, Deep Vein Thrombosis,] [3:Anemia, Deep Vein Thrombosis,] [N/A:N/A] Comorbid History: [2:Hypertension, Type II Diabetes, Dementia 09/18/2020] [3:Hypertension, Type II Diabetes, Dementia 12/16/2020] [N/A:N/A] Date Acquired: [2:17] [3:5] [N/A:N/A] Weeks of Treatment: [2:Open] [3:Open] [N/A:N/A] Wound Status: [2:5.2x4x3] [3:0.8x0.6x0.2] [N/A:N/A] Measurements L x W x D (cm) [2:16.336] [3:0.377] [N/A:N/A] A (cm) : rea [2:49.009] [3:0.075] [N/A:N/A] Volume (cm) : [2:60.80%] [3:81.20%] [N/A:N/A] %  Reduction in A rea: [2:-1076.40%] [3:87.50%] [N/A:N/A] % Reduction in Volume: [2:10] Starting Position 1 (o'clock): [2:1] Ending Position 1 (o'clock): [2:4.5] Maximum Distance 1 (cm): [2:Yes] [3:No] [N/A:N/A] Undermining: [2:Category/Stage III] [3:Category/Stage III] [N/A:N/A] Classification: [2:Medium] [3:Medium] [N/A:N/A] Exudate A mount: [2:Serosanguineous] [3:Serosanguineous] [N/A:N/A] Exudate Type: [2:red, brown] [3:red, brown] [N/A:N/A] Exudate Color: [2:Epibole] [3:Well defined, not attached] [N/A:N/A] Wound Margin: [2:Large (67-100%)] [3:Medium (34-66%)] [N/A:N/A] Granulation A mount: [2:Red, Pink] [3:Pink] [N/A:N/A] Granulation Quality: [2:Small (1-33%)] [3:Medium (34-66%)] [N/A:N/A] Necrotic A mount: [2:Fat Layer (Subcutaneous Tissue): Yes Fat Layer (  Subcutaneous Tissue): Yes N/A] Exposed Structures: [2:Fascia: No Tendon: No Muscle: No Joint: No Bone: No Medium (34-66%)] [3:Fascia: No Tendon: No Muscle: No Joint: No Bone: No Medium (34-66%)] [N/A:N/A] Treatment Notes Electronic Signature(s) Signed: 02/02/2021 4:21:38 PM By: Linton Ham MD Signed: 02/03/2021 5:21:18 PM By: Rhae Hammock RN Entered By: Linton Ham on 02/02/2021 10:33:22 -------------------------------------------------------------------------------- Multi-Disciplinary Care Plan Details Patient Name: Date of Service: [place] 02/02/2021 9:45 A M Medical Record Number: [id number] Patient Account Number: [id number] Date of Birth/Sex: Treating RN: 07/19/1935 (85 y.o. [name] Primary Care Janette Harvie: [name] Other Clinician: Referring Inetta Dicke: Treating Allecia Bells/Extender: Deloria Lair in Treatment: 17 Active Inactive Wound/Skin Impairment Nursing Diagnoses: Impaired tissue integrity Goals: Patient/caregiver will verbalize understanding of skin care regimen Date Initiated: 10/02/2020 Target Resolution Date: 02/26/2021 Goal Status: Active Ulcer/skin  breakdown will have a volume reduction of 30% by week 4 Date Initiated: 10/02/2020 Date Inactivated: 01/06/2021 Target Resolution Date: 01/09/2021 Goal Status: Met Interventions: Assess patient/caregiver ability to obtain necessary supplies Assess patient/caregiver ability to perform ulcer/skin care regimen upon admission and as needed Assess ulceration(s) every visit Provide education on ulcer and skin care Treatment Activities: Skin care regimen initiated : 10/02/2020 Topical wound management initiated : 10/02/2020 Notes: 11/10/20 : Goal not yet met, eschar debrided off increasing wound size. Electronic Signature(s) Signed: 02/04/2021 4:49:13 PM By: Levan Hurst RN, BSN Entered By: Levan Hurst on 02/02/2021 10:12:25 -------------------------------------------------------------------------------- Pain Assessment Details Patient Name: Date of Service: NATALIYAH, PACKHAM 02/02/2021 9:45 A M Medical Record Number: [id number] Patient Account Number: [id number] Date of Birth/Sex: Treating RN: 02/23/1936 (85 y.o. [name] Primary Care Seddrick Flax: [name] Other Clinician: Referring Claudius Mich: Treating Cezar Misiaszek/Extender: Deloria Lair in Treatment: 17 Active Problems Location of Pain Severity and Description of Pain Patient Has Paino No Site Locations Pain Management and Medication Current Pain Management: Electronic Signature(s) Signed: 02/04/2021 4:49:13 PM By: Levan Hurst RN, BSN Entered By: Levan Hurst on 02/02/2021 09:45:57 -------------------------------------------------------------------------------- Patient/Caregiver Education Details Patient Name: Date of Service: [name] 9/27/2022andnbsp9:45 [place] Record Number: [id number] Patient Account Number: [id number] Date of Birth/Gender: Treating RN: April 10, 1936 (85 y.o. [name] Primary Care Physician: [name] Other Clinician: Referring Physician: Treating  Physician/Extender: Deloria Lair in Treatment: 17 Education Assessment Education Provided To: Patient Education Topics Provided Wound/Skin Impairment: Methods: Explain/Verbal Responses: State content correctly [employer]) Signed: 02/04/2021 4:49:13 PM By: Levan Hurst RN, BSN Entered By: Levan Hurst on 02/02/2021 10:12:40 -------------------------------------------------------------------------------- Wound Assessment Details Patient Name: Date of Service: DANITZA, SCHOENFELDT 02/02/2021 9:45 A M Medical Record Number: [id number] Patient Account Number: [id number] Date of Birth/Sex: Treating RN: [birth year] (85 y.o. [name] Primary Care Kayzen Kendzierski: [name] Other Clinician: Referring Makyah Lavigne: Treating Margo Lama/Extender: Deloria Lair in Treatment: 17 Wound Status Wound Number: 2 Primary Pressure Ulcer Etiology: Wound Location: Left Gluteus Wound Status: Open Wounding Event: Pressure Injury Comorbid Anemia, Deep Vein Thrombosis, Hypertension, Type II Date Acquired: 09/18/2020 History: Diabetes, Dementia Weeks Of Treatment: 17 Clustered Wound: No Photos Wound Measurements Length: (cm) 5.2 Width: (cm) 4 Depth: (cm) 3 Area: (cm) 16.336 Volume: (cm) 49.009 % Reduction in Area: 60.8% % Reduction in Volume: -1076.4% Epithelialization: Medium (34-66%) Undermining: Yes Starting Position (o'clock): 10 Ending Position (o'clock): 1 Maximum Distance: (cm) 4.5 Wound Description Classification: Category/Stage III Wound Margin: Epibole Exudate Amount: Medium Exudate Type: Serosanguineous Exudate Color: red, brown Foul Odor After Cleansing: No Slough/Fibrino Yes Wound Bed Granulation Amount: Large (67-100%) Exposed  Structure Granulation Quality: Red, Pink Fascia Exposed: No Necrotic Amount: Small (1-33%) Fat Layer (Subcutaneous Tissue) Exposed: Yes Necrotic Quality: Adherent Slough Tendon  Exposed: No Muscle Exposed: No Joint Exposed: No Bone Exposed: No Treatment Notes Wound #2 (Gluteus) Wound Laterality: Left Cleanser Soap and Water Discharge Instruction: May shower and wash wound with dial antibacterial soap and water prior to dressing change. Peri-Wound Care Topical Primary Dressing KerraCel Ag Gelling Fiber Dressing, 4x5 in (silver alginate) Discharge Instruction: Apply silver alginate to wound bed as instructed Secondary Dressing Woven Gauze Sponge, Non-Sterile 4x4 in Discharge Instruction: Dry gauze backing over alginate ABD Pad, 8x10 Discharge Instruction: Apply over primary dressing as directed. Secured With 22M Medipore H Soft Cloth Surgical T 4 x 2 (in/yd) ape Discharge Instruction: Secure dressing with tape as directed. Compression Wrap Compression Stockings Add-Ons Electronic Signature(s) Signed: 02/02/2021 4:47:08 PM By: Lorrin Jackson Signed: 02/04/2021 4:49:13 PM By: Levan Hurst RN, BSN Entered By: Levan Hurst on 02/02/2021 10:21:28 -------------------------------------------------------------------------------- Wound Assessment Details Patient Name: Date of Service: SHAWNITA, KRIZEK 02/02/2021 9:45 A M Medical Record Number: [id number] Patient Account Number: [id number] Date of Birth/Sex: Treating RN: [birth year] (85 y.o. [name] Primary Care Jacey Pelc: [name] Other Clinician: Referring Ethlyn Alto: Treating Lavanna Rog/Extender: Deloria Lair in Treatment: 17 Wound Status Wound Number: 3 Primary Pressure Ulcer Etiology: Wound Location: Right, Lateral Lower Leg Wound Status: Open Wounding Event: Pressure Injury Comorbid Anemia, Deep Vein Thrombosis, Hypertension, Type II Date Acquired: 12/16/2020 History: Diabetes, Dementia Weeks Of Treatment: 5 Clustered Wound: No Photos Wound Measurements Length: (cm) 0.8 Width: (cm) 0.6 Depth: (cm) 0.2 Area: (cm) 0.377 Volume: (cm) 0.075 % Reduction  in Area: 81.2% % Reduction in Volume: 87.5% Epithelialization: Medium (34-66%) Tunneling: No Undermining: No Wound Description Classification: Category/Stage III Wound Margin: Well defined, not attached Exudate Amount: Medium Exudate Type: Serosanguineous Exudate Color: red, brown Foul Odor After Cleansing: No Slough/Fibrino Yes Wound Bed Granulation Amount: Medium (34-66%) Exposed Structure Granulation Quality: Pink Fascia Exposed: No Necrotic Amount: Medium (34-66%) Fat Layer (Subcutaneous Tissue) Exposed: Yes Necrotic Quality: Adherent Slough Tendon Exposed: No Muscle Exposed: No Joint Exposed: No Bone Exposed: No Treatment Notes Wound #3 (Lower Leg) Wound Laterality: Right, Lateral Cleanser Soap and Water Discharge Instruction: May shower and wash wound with dial antibacterial soap and water prior to dressing change. Wound Cleanser Discharge Instruction: Cleanse the wound with wound cleanser prior to applying a clean dressing using gauze sponges, not tissue or cotton balls. Peri-Wound Care Topical Primary Dressing Hydrofera Blue Ready Foam, 2.5 x2.5 in Discharge Instruction: Apply to wound bed as instructed Secondary Dressing Zetuvit Plus Silicone Border Dressing 4x4 (in/in) Discharge Instruction: Apply silicone border over primary dressing as directed. Secured With Compression Wrap Compression Stockings Environmental education officer) Signed: 02/02/2021 4:47:08 PM By: Lorrin Jackson Entered By: Lorrin Jackson on 02/02/2021 09:50:44 -------------------------------------------------------------------------------- Vitals Details Patient Name: Date of Service: [name] 02/02/2021 9:45 A M Medical Record Number: [id number] Patient Account Number: [id number] Date of Birth/Sex: Treating RN: 21-Apr-1936 (85 y.o. [name] Primary Care Huckleberry Martinson: [name] Other Clinician: Referring Jeziel Hoffmann: Treating Maryn Freelove/Extender: Deloria Lair in Treatment: 17 Vital Signs Time Taken: 09:45 Temperature (F): 97.8 Height (in): 68 Pulse (bpm): 78 Weight (lbs): 110 Respiratory Rate (breaths/min): 16 Body Mass Index (BMI): 16.7 Blood Pressure (mmHg): 184/110 Capillary Blood Glucose (mg/dl): 125 Reference Range: 80 - 120 mg / dl Notes glucose per pt report Electronic Signature(s) Signed: 02/04/2021 4:49:13 PM By: Levan Hurst RN, BSN Entered By: Donnal Debar,  Shat[place] on 02/02/2021 09:45:52

## 2021-02-04 NOTE — Progress Notes (Signed)
[name] ([id number]) , Visit Report for 02/02/2021 HPI Details Patient Name: Date of Service: [name], [name] 02/02/2021 9:45 A M Medical Record Number: [id number] Patient Account Number: [id number] Date of Birth/Sex: Treating RN: [birth year] (85 y.o. [name] Primary Care Provider: [name] Other Clinician: Referring Provider: Treating Provider/Extender: Deloria Lair in Treatment: 17 History of Present Illness HPI Description: Admission 5/27 [name] is an 85 year old female with a past medical history of type 2 diabetes on oral agents, hypertension and dementia that presents today to the clinic for a wound to her right buttocks. Her daughter-in-law is present today and helps provide the history. This issue has been going on for 2 to 3 weeks now. She states she developed an eschar about 1 week ago. She has been taking doxycycline for possible soft tissue infection to the area prescribed by her primary care provider. Patient currently denies any signs of infection. 6/3; patient admitted to the clinic last week. She has a large necrotic area on her left buttock. They use Santyl last week. On arrival this visit she had completely necrotic surface with open to subcutaneous tissue. The surface was completely nonviable. They have been using Santyl. She apparently had been on doxycycline which she is just finishing prescribed by her primary doctor for possible soft tissue infection 6/7; patient with a large necrotic wound over her left buttock. Aggressive debridement last week. Culture of this grew Proteus unfortunately would not of been covered well or at least predictably by the doxycycline that her primary doctor put her on. I have therefore put her on Augmentin suspension 3 times a day. We are using silver alginate as the primary dressing while we deal with the infection. Her daughter has a list of concerns including swallowing  difficulties, concerns about aspiration. The patient has advanced dementia. If this is Alzheimer's disease it is at its preterminal stages. I went over that swallowing difficulties are part of what happens in the preterminal stages of Alzheimer's disease. Nevertheless we will family members seems to want to pursue a very aggressive course 6/21 large wound over her left buttock. She has completed the antibiotics I gave her [Augmentin]. We are using silver alginate while we are dealing with the infection and also to help with the drainage In general the wound looks better she has healthy granulation over perhaps 70% of it. Superiorly there is still necrotic debris I did not attempt to debride this today The patient has advanced dementia. I do not know that she could tolerate a wound VAC. She also has double incontinence which would make that difficult. Nevertheless she has been cared for diligently by her family. She is apparently eating well and may be 2 to 3 hours on this area all day 7/5; 2-week follow-up. She continues with a nice improvement in overall condition of the wound bed. The undermining area has still some adherent surface slough. We have been using silver alginate because of the drainage and possibility of at least superficial wound infection/colonization. Although I said in previous notes I wondered whether she could tolerate the wound VAC they have done so well with this wound at home I do not want to necessarily rule her out for a wound VAC and I am going to direct the start of a trial of wound VAC therapy if this can be covered by insurance, if we have home health support to change it at all 7/19; we did not get very far with the wound  VAC because my original ICD 10 said this was unstageable. As usual this represents a considerable frustration because we would not of ordered a wound VAC if the wound was still unstageable. This currently represents a stage IV staging. This is all the  way down through muscle layer. There is a rim of tissue over the bone and no palpable exposed bone but this is a deep wound. Fortunately at present no evidence of infection. We have been using silver collagen with backing wet-to-dry. The wound is really no better today but no worse 8/2; patient presents for 2-week follow-up. She has been using the wound VAC for the past week. There has been some issues with keeping the drape in place however home health comes out to reapply the drape when this happens. Overall there are no issues or complaints today. No signs of infection. 8/17; patient has been using the wound VAC on her wound on the upper left buttock. About 50 to 60% of this area is fully granulated however from about 10-5 o'clock there is still undermining. UNFORTUNATELY she also arrives in clinic today with a wound over her right fibular head. According to her daughter has been there for about 2 weeks. She wonders whether there is an abrasion although no concrete history of this. Given her frail status this is probably most likely a pressure ulcer over a bony prominence [fibular head] 8/31 unfortunately the upper left buttock pressure ulcer stage IV is not as optimistically better as I was hoping. The granulation that I identified on 12/23/20 is no longer adherent at the inferior pole the undermining is quite extensive but there is no palpable bone. No evidence of surrounding infection. There may be some improvement in the undermining measurements We are still dealing with the additional wound we identified 2 weeks ago on the right fibular head as well we have been using silver alginate here 9/14; 1 month follow-up. She has now had the wound VAC on for probably 2 months. No major change here if she still has the tunneling area at 12:00 and undermining from about 6-12. Surface of the wound does not look healthy. We use MolecuLight to look at the wound surface which actually looked negative however  she has extensive immunofluorescence in the wound edge from about 12-6 o'clock this actually extends beyond the visible wound margin by quite amount. 9/27; no major change in the left buttock wound. There is still undermining however no exposed bone. Granulation looks reasonable. Right fibular head is much smaller We are using Hydrofera Blue on the right fibular head silver alginate on the buttock area Electronic Signature(s) Signed: 02/02/2021 4:21:38 PM By: Linton Ham MD Entered By: Linton Ham on 02/02/2021 10:34:58 -------------------------------------------------------------------------------- Physical Exam Details Patient Name: Date of Service: [name], [name] 02/02/2021 9:45 A M Medical Record Number: [id number] Patient Account Number: [id number] Date of Birth/Sex: Treating RN: 05/11/1935 (85 y.o. [name] Primary Care Provider: [name] Other Clinician: Referring Provider: Treating Provider/Extender: Deloria Lair in Treatment: 82 Constitutional Patient is hypertensive. Repeated at 180/94. Pulse regular and within target range for patient.Marland Kitchen Respirations regular, non-labored and within target range.. Temperature is normal and within the target range for the patient.Marland Kitchen Appears in no distress. Notes Wound exam; left buttock wound looks about the same granulation tissue however looks better. There is no exposed bone. The area on the right fibular head is much better. Electronic Signature(s) Signed: 02/02/2021 4:21:38 PM By: Linton Ham MD Entered By: Linton Ham on  02/02/2021 10:37:04 -------------------------------------------------------------------------------- Physician Orders Details Patient Name: Date of Service: [name], [name] 02/02/2021 9:45 A M Medical Record Number: [id number] Patient Account Number: [id number] Date of Birth/Sex: Treating RN: [birth year] (85 y.o. [name] Primary Care Provider: [name] Other Clinician: Referring Provider: Treating Provider/Extender: Deloria Lair in Treatment: 17 Verbal / Phone Orders: No Diagnosis Coding ICD-10 Coding Code Description [phone] Pressure ulcer of left buttock, stage 4 E11.9 Type 2 diabetes mellitus without complications X90.24 Pressure ulcer of unspecified site, stage 3 Follow-up Appointments ppointment in 2 weeks. - with Dr. Dellia Nims Return A Off-Loading Low air-loss mattress (Group 2) Turn and reposition every 2 hours Additional Orders / Instructions Follow Nutritious Diet - 100-120g of Protein Home Health No change in wound care orders this week; continue Home Health for wound care. May utilize formulary equivalent dressing for wound treatment orders unless otherwise specified. Other Home Health Orders/Instructions: - [facility] Monday, Wednesday, and Friday Wound Treatment Wound #2 - Gluteus Wound Laterality: Left Cleanser: Soap and Water (Home Health) 1 x Per Day/30 Days Discharge Instructions: May shower and wash wound with dial antibacterial soap and water prior to dressing change. Prim Dressing: KerraCel Ag Gelling Fiber Dressing, 4x5 in (silver alginate) (Home Health) 1 x Per Day/30 Days ary Discharge Instructions: Apply silver alginate to wound bed as instructed Secondary Dressing: Woven Gauze Sponge, Non-Sterile 4x4 in (Home Health) 1 x Per Day/30 Days Discharge Instructions: Dry gauze backing over alginate Secondary Dressing: ABD Pad, 8x10 (Home Health) 1 x Per Day/30 Days Discharge Instructions: Apply over primary dressing as directed. Secured With: 70M Medipore H Soft Cloth Surgical T 4 x 2 (in/yd) (Home Health) 1 x Per Day/30 Days ape Discharge Instructions: Secure dressing with tape as directed. Wound #3 - Lower Leg Wound Laterality: Right, Lateral Cleanser: Soap and Water [facility]) Every Other Day/30 Days Discharge Instructions: May shower and wash wound with dial antibacterial  soap and water prior to dressing change. Cleanser: Wound Cleanser [facility]) Every Other Day/30 Days Discharge Instructions: Cleanse the wound with wound cleanser prior to applying a clean dressing using gauze sponges, not tissue or cotton balls. Prim Dressing: Hydrofera Blue Ready Foam, 2.5 x2.5 in [facility]) Every Other Day/30 Days ary Discharge Instructions: Apply to wound bed as instructed Secondary Dressing: Zetuvit Plus Silicone Border Dressing 4x4 (in/in) ([place]) Every Other Day/30 Days Discharge Instructions: Apply silicone border over primary dressing as directed. Electronic Signature(s) Signed: 02/02/2021 4:21:38 PM By: Linton Ham MD Signed: 02/04/2021 4:49:13 PM By: Levan Hurst RN, BSN Entered By: Levan Hurst on 02/02/2021 10:23:37 -------------------------------------------------------------------------------- Problem List Details Patient Name: Date of Service: KAMRON, PORTEE 02/02/2021 9:45 A M Medical Record Number: [id number] Patient Account Number: [id number] Date of Birth/Sex: Treating RN: 07-24-1935 (85 y.o. [name] Primary Care Provider: [name] Other Clinician: Referring Provider: Treating Provider/Extender: Deloria Lair in Treatment: 17 Active Problems ICD-10 Encounter Code Description Active Date MDM Diagnosis [phone] Pressure ulcer of left buttock, stage 4 11/24/2020 No Yes E11.9 Type 2 diabetes mellitus without complications 08/07/9620 No Yes L89.93 Pressure ulcer of unspecified site, stage 3 12/23/2020 No Yes Inactive Problems ICD-10 Code Description Active Date Inactive Date L89.320 Pressure ulcer of left buttock, unstageable 10/13/2020 10/13/2020 Resolved Problems Electronic Signature(s) Signed: 02/02/2021 4:21:38 PM By: Linton Ham MD Entered By: Linton Ham on 02/02/2021 10:33:08 -------------------------------------------------------------------------------- Progress Note  Details Patient Name: Date of Service: [name] 02/02/2021 9:45 A M Medical Record Number: [id number] Patient Account  Number: [id number] Date of Birth/Sex: Treating RN: 12-18-1935 (85 y.o. [name] Primary Care Provider: [name] Other Clinician: Referring Provider: Treating Provider/Extender: Deloria Lair in Treatment: 17 Subjective History of Present Illness (HPI) Admission 5/27 Ms. Cherre Kothari is an 85 year old female with a past medical history of type 2 diabetes on oral agents, hypertension and dementia that presents today to the clinic for a wound to her right buttocks. Her daughter-in-law is present today and helps provide the history. This issue has been going on for 2 to 3 weeks now. She states she developed an eschar about 1 week ago. She has been taking doxycycline for possible soft tissue infection to the area prescribed by her primary care provider. Patient currently denies any signs of infection. 6/3; patient admitted to the clinic last week. She has a large necrotic area on her left buttock. They use Santyl last week. On arrival this visit she had completely necrotic surface with open to subcutaneous tissue. The surface was completely nonviable. They have been using Santyl. She apparently had been on doxycycline which she is just finishing prescribed by her primary doctor for possible soft tissue infection 6/7; patient with a large necrotic wound over her left buttock. Aggressive debridement last week. Culture of this grew Proteus unfortunately would not of been covered well or at least predictably by the doxycycline that her primary doctor put her on. I have therefore put her on Augmentin suspension 3 times a day. We are using silver alginate as the primary dressing while we deal with the infection. Her daughter has a list of concerns including swallowing difficulties, concerns about aspiration. The patient has advanced  dementia. If this is Alzheimer's disease it is at its preterminal stages. I went over that swallowing difficulties are part of what happens in the preterminal stages of Alzheimer's disease. Nevertheless we will family members seems to want to pursue a very aggressive course 6/21 large wound over her left buttock. She has completed the antibiotics I gave her [Augmentin]. We are using silver alginate while we are dealing with the infection and also to help with the drainage In general the wound looks better she has healthy granulation over perhaps 70% of it. Superiorly there is still necrotic debris I did not attempt to debride this today The patient has advanced dementia. I do not know that she could tolerate a wound VAC. She also has double incontinence which would make that difficult. Nevertheless she has been cared for diligently by her family. She is apparently eating well and may be 2 to 3 hours on this area all day 7/5; 2-week follow-up. She continues with a nice improvement in overall condition of the wound bed. The undermining area has still some adherent surface slough. We have been using silver alginate because of the drainage and possibility of at least superficial wound infection/colonization. Although I said in previous notes I wondered whether she could tolerate the wound VAC they have done so well with this wound at home I do not want to necessarily rule her out for a wound VAC and I am going to direct the start of a trial of wound VAC therapy if this can be covered by insurance, if we have home health support to change it at all 7/19; we did not get very far with the wound VAC because my original ICD 10 said this was unstageable. As usual this represents a considerable frustration because we would not of ordered a wound VAC if the  wound was still unstageable. This currently represents a stage IV staging. This is all the way down through muscle layer. There is a rim of tissue over the  bone and no palpable exposed bone but this is a deep wound. Fortunately at present no evidence of infection. We have been using silver collagen with backing wet-to-dry. The wound is really no better today but no worse 8/2; patient presents for 2-week follow-up. She has been using the wound VAC for the past week. There has been some issues with keeping the drape in place however home health comes out to reapply the drape when this happens. Overall there are no issues or complaints today. No signs of infection. 8/17; patient has been using the wound VAC on her wound on the upper left buttock. About 50 to 60% of this area is fully granulated however from about 10-5 o'clock there is still undermining. UNFORTUNATELY she also arrives in clinic today with a wound over her right fibular head. According to her daughter has been there for about 2 weeks. She wonders whether there is an abrasion although no concrete history of this. Given her frail status this is probably most likely a pressure ulcer over a bony prominence [fibular head] 8/31 unfortunately the upper left buttock pressure ulcer stage IV is not as optimistically better as I was hoping. The granulation that I identified on 12/23/20 is no longer adherent at the inferior pole the undermining is quite extensive but there is no palpable bone. No evidence of surrounding infection. There may be some improvement in the undermining measurements We are still dealing with the additional wound we identified 2 weeks ago on the right fibular head as well we have been using silver alginate here 9/14; 1 month follow-up. She has now had the wound VAC on for probably 2 months. No major change here if she still has the tunneling area at 12:00 and undermining from about 6-12. Surface of the wound does not look healthy. We use MolecuLight to look at the wound surface which actually looked negative however she has extensive immunofluorescence in the wound edge from about  12-6 o'clock this actually extends beyond the visible wound margin by quite amount. 9/27; no major change in the left buttock wound. There is still undermining however no exposed bone. Granulation looks reasonable. oo Right fibular head is much smaller We are using Hydrofera Blue on the right fibular head silver alginate on the buttock area Objective Constitutional Patient is hypertensive. Repeated at 180/94. Pulse regular and within target range for patient.Marland Kitchen Respirations regular, non-labored and within target range.. Temperature is normal and within the target range for the patient.Marland Kitchen Appears in no distress. Vitals Time Taken: 9:45 AM, Height: 68 in, Weight: 110 lbs, BMI: 16.7, Temperature: 97.8 F, Pulse: 78 bpm, Respiratory Rate: 16 breaths/min, Blood Pressure: 184/110 mmHg, Capillary Blood Glucose: 125 mg/dl. General Notes: glucose per pt report General Notes: Wound exam; left buttock wound looks about the same granulation tissue however looks better. There is no exposed bone. oo The area on the right fibular head is much better. Integumentary (Hair, Skin) Wound #2 status is Open. Original cause of wound was Pressure Injury. The date acquired was: 09/18/2020. The wound has been in treatment 17 weeks. The wound is located on the Left Gluteus. The wound measures 5.2cm length x 4cm width x 3cm depth; 16.336cm^2 area and 49.009cm^3 volume. There is Fat Layer (Subcutaneous Tissue) exposed. There is undermining starting at 10:00 and ending at 1:00 with a maximum  distance of 4.5cm. There is a medium amount of serosanguineous drainage noted. The wound margin is epibole. There is large (67-100%) red, pink granulation within the wound bed. There is a small (1-33%) amount of necrotic tissue within the wound bed including Adherent Slough. Wound #3 status is Open. Original cause of wound was Pressure Injury. The date acquired was: 12/16/2020. The wound has been in treatment 5 weeks. The wound is located  on the Right,Lateral Lower Leg. The wound measures 0.8cm length x 0.6cm width x 0.2cm depth; 0.377cm^2 area and 0.075cm^3 volume. There is Fat Layer (Subcutaneous Tissue) exposed. There is no tunneling or undermining noted. There is a medium amount of serosanguineous drainage noted. The wound margin is well defined and not attached to the wound base. There is medium (34-66%) pink granulation within the wound bed. There is a medium (34- 66%) amount of necrotic tissue within the wound bed including Adherent Slough. Assessment Active Problems ICD-10 Pressure ulcer of left buttock, stage 4 Type 2 diabetes mellitus without complications Pressure ulcer of unspecified site, stage 3 Plan Follow-up Appointments: Return Appointment in 2 weeks. - with Dr. Dellia Nims Off-Loading: Low air-loss mattress (Group 2) Turn and reposition every 2 hours Additional Orders / Instructions: Follow Nutritious Diet - 100-120g of Protein Home Health: No change in wound care orders this week; continue Home Health for wound care. May utilize formulary equivalent dressing for wound treatment orders unless otherwise specified. Other Home Health Orders/Instructions: - [facility] Monday, Wednesday, and Friday WOUND #2: - Gluteus Wound Laterality: Left Cleanser: Soap and Water (Home Health) 1 x Per Day/30 Days Discharge Instructions: May shower and wash wound with dial antibacterial soap and water prior to dressing change. Prim Dressing: KerraCel Ag Gelling Fiber Dressing, 4x5 in (silver alginate) (Home Health) 1 x Per Day/30 Days ary Discharge Instructions: Apply silver alginate to wound bed as instructed Secondary Dressing: Woven Gauze Sponge, Non-Sterile 4x4 in (Home Health) 1 x Per Day/30 Days Discharge Instructions: Dry gauze backing over alginate Secondary Dressing: ABD Pad, 8x10 (Home Health) 1 x Per Day/30 Days Discharge Instructions: Apply over primary dressing as directed. Secured With: 9M Medipore H Soft Cloth  Surgical T 4 x 2 (in/yd) (Home Health) 1 x Per Day/30 Days ape Discharge Instructions: Secure dressing with tape as directed. WOUND #3: - Lower Leg Wound Laterality: Right, Lateral Cleanser: Soap and Water [facility]) Every Other Day/30 Days Discharge Instructions: May shower and wash wound with dial antibacterial soap and water prior to dressing change. Cleanser: Wound Cleanser [facility]) Every Other Day/30 Days Discharge Instructions: Cleanse the wound with wound cleanser prior to applying a clean dressing using gauze sponges, not tissue or cotton balls. Prim Dressing: Hydrofera Blue Ready Foam, 2.5 x2.5 in [facility]) Every Other Day/30 Days ary Discharge Instructions: Apply to wound bed as instructed Secondary Dressing: Zetuvit Plus Silicone Border Dressing 4x4 (in/in) ([place]) Every Other Day/30 Days Discharge Instructions: Apply silicone border over primary dressing as directed. 1. I continued with the silver alginate on the buttock wound and the Hydrofera Blue on the right fibular head. 2. I do not expect the buttock wound to heal however for now it is stable to improved. She did not really respond to wound VAC as well as I hope Electronic Signature(s) Signed: 02/02/2021 4:21:38 PM By: Linton Ham MD Entered By: Linton Ham on 02/02/2021 10:38:01 -------------------------------------------------------------------------------- SuperBill Details Patient Name: Date of Service: [name] 02/02/2021 Medical Record Number: [id number] Patient Account Number: [id number] Date of Birth/Sex: Treating RN:  17-Oct-1935 (85 y.o. [name], Lauren Primary Care Provider: [name] Other Clinician: Referring Provider: Treating Provider/Extender: Deloria Lair in Treatment: 17 Diagnosis Coding ICD-10 Codes Code Description [phone] Pressure ulcer of left buttock, stage 4 E11.9 Type 2 diabetes mellitus without complications V22.24  Pressure ulcer of unspecified site, stage 3 Facility Procedures CPT4 Code: [id number] Description: 99214 - WOUND CARE VISIT-LEV 4 EST PT Modifier: Quantity: 1 Physician Procedures : CPT4 Code Description Modifier [id number] - WC PHYS LEVEL 3 - EST PT ICD-10 Diagnosis Description L89.324 Pressure ulcer of left buttock, stage 4 L89.93 Pressure ulcer of unspecified site, stage 3 Quantity: 1 Electronic Signature(s) Signed: 02/02/2021 4:21:38 PM By: Linton Ham MD Signed: 02/04/2021 4:49:13 PM By: Levan Hurst RN, BSN Entered By: Levan Hurst on 02/02/2021 11:06:55

## 2021-02-05 DIAGNOSIS — E785 Hyperlipidemia, unspecified: Secondary | ICD-10-CM | POA: Diagnosis not present

## 2021-02-05 DIAGNOSIS — F0390 Unspecified dementia without behavioral disturbance: Secondary | ICD-10-CM | POA: Diagnosis not present

## 2021-02-05 DIAGNOSIS — I890 Lymphedema, not elsewhere classified: Secondary | ICD-10-CM | POA: Diagnosis not present

## 2021-02-05 DIAGNOSIS — E119 Type 2 diabetes mellitus without complications: Secondary | ICD-10-CM | POA: Diagnosis not present

## 2021-02-05 DIAGNOSIS — L89893 Pressure ulcer of other site, stage 3: Secondary | ICD-10-CM | POA: Diagnosis not present

## 2021-02-05 DIAGNOSIS — L89320 Pressure ulcer of left buttock, unstageable: Secondary | ICD-10-CM | POA: Diagnosis not present

## 2021-02-11 DIAGNOSIS — L89893 Pressure ulcer of other site, stage 3: Secondary | ICD-10-CM | POA: Diagnosis not present

## 2021-02-11 DIAGNOSIS — E119 Type 2 diabetes mellitus without complications: Secondary | ICD-10-CM | POA: Diagnosis not present

## 2021-02-11 DIAGNOSIS — E785 Hyperlipidemia, unspecified: Secondary | ICD-10-CM | POA: Diagnosis not present

## 2021-02-11 DIAGNOSIS — I890 Lymphedema, not elsewhere classified: Secondary | ICD-10-CM | POA: Diagnosis not present

## 2021-02-11 DIAGNOSIS — L89320 Pressure ulcer of left buttock, unstageable: Secondary | ICD-10-CM | POA: Diagnosis not present

## 2021-02-11 DIAGNOSIS — F0390 Unspecified dementia without behavioral disturbance: Secondary | ICD-10-CM | POA: Diagnosis not present

## 2021-02-16 DIAGNOSIS — L89320 Pressure ulcer of left buttock, unstageable: Secondary | ICD-10-CM | POA: Diagnosis not present

## 2021-02-16 DIAGNOSIS — I890 Lymphedema, not elsewhere classified: Secondary | ICD-10-CM | POA: Diagnosis not present

## 2021-02-16 DIAGNOSIS — L89893 Pressure ulcer of other site, stage 3: Secondary | ICD-10-CM | POA: Diagnosis not present

## 2021-02-16 DIAGNOSIS — E785 Hyperlipidemia, unspecified: Secondary | ICD-10-CM | POA: Diagnosis not present

## 2021-02-16 DIAGNOSIS — E119 Type 2 diabetes mellitus without complications: Secondary | ICD-10-CM | POA: Diagnosis not present

## 2021-02-16 DIAGNOSIS — F0390 Unspecified dementia without behavioral disturbance: Secondary | ICD-10-CM | POA: Diagnosis not present

## 2021-02-17 ENCOUNTER — Encounter (HOSPITAL_BASED_OUTPATIENT_CLINIC_OR_DEPARTMENT_OTHER): Payer: Medicare Other | Attending: Internal Medicine | Admitting: Internal Medicine

## 2021-02-17 ENCOUNTER — Other Ambulatory Visit: Payer: Self-pay

## 2021-02-17 DIAGNOSIS — E119 Type 2 diabetes mellitus without complications: Secondary | ICD-10-CM | POA: Diagnosis not present

## 2021-02-17 DIAGNOSIS — L89893 Pressure ulcer of other site, stage 3: Secondary | ICD-10-CM | POA: Diagnosis not present

## 2021-02-17 DIAGNOSIS — I10 Essential (primary) hypertension: Secondary | ICD-10-CM | POA: Diagnosis not present

## 2021-02-17 DIAGNOSIS — Z7984 Long term (current) use of oral hypoglycemic drugs: Secondary | ICD-10-CM | POA: Diagnosis not present

## 2021-02-17 DIAGNOSIS — F02C0 Dementia in other diseases classified elsewhere, severe, without behavioral disturbance, psychotic disturbance, mood disturbance, and anxiety: Secondary | ICD-10-CM | POA: Diagnosis not present

## 2021-02-17 DIAGNOSIS — L89324 Pressure ulcer of left buttock, stage 4: Secondary | ICD-10-CM | POA: Diagnosis not present

## 2021-02-18 NOTE — Progress Notes (Signed)
[name] ([id number]) , Visit Report for 02/17/2021 Arrival Information Details Patient Name: Date of Service: [name], [name] 02/17/2021 9:45 A M Medical Record Number: [id number] Patient Account Number: [id number] Date of Birth/Sex: Treating RN: 09/27/1935 (85 y.o. [name] Primary Care [name]: [name] Other Clinician: Referring [name]: Treating [name]/Extender: Deloria Lair in Treatment: 85 Visit Information History Since Last Visit Added or deleted any medications: No Patient Arrived: Wheel Chair Any new allergies or adverse reactions: No Arrival Time: 10:00 Had a fall or experienced change in No Accompanied By: caregiver activities of daily living that may affect Transfer Assistance: Manual risk of falls: Patient Identification Verified: Yes Signs or symptoms of abuse/neglect since last visito No Secondary Verification Process Completed: Yes Hospitalized since last visit: No Patient Requires Transmission-Based Precautions: No Implantable device outside of the clinic excluding No Patient Has Alerts: No cellular tissue based products placed in the center since last visit: Has Dressing in Place as Prescribed: Yes Pain Present Now: No Electronic Signature(s) Signed: 02/18/2021 4:58:13 PM By: Levan Hurst RN, BSN Entered By: Levan Hurst on 02/17/2021 10:01:10 -------------------------------------------------------------------------------- Clinic Level of Care Assessment Details Patient Name: Date of Service: [name], [name] 02/17/2021 9:45 A M Medical Record Number: [id number] Patient Account Number: [id number] Date of Birth/Sex: Treating RN: April 04, 1936 (85 y.o. [name] Primary Care [name]: [name] Other Clinician: Referring [name]: Treating [name]/Extender: Deloria Lair in Treatment: 19 Clinic Level of Care Assessment Items TOOL 4 Quantity Score X- 1 0 Use when  only an EandM is performed on FOLLOW-UP visit ASSESSMENTS - Nursing Assessment / Reassessment X- 1 10 Reassessment of Co-morbidities (includes updates in patient status) X- 1 5 Reassessment of Adherence to Treatment Plan ASSESSMENTS - Wound and Skin A ssessment / Reassessment []  - 0 Simple Wound Assessment / Reassessment - one wound X- 2 5 Complex Wound Assessment / Reassessment - multiple wounds []  - 0 Dermatologic / Skin Assessment (not related to wound area) ASSESSMENTS - Focused Assessment []  - 0 Circumferential Edema Measurements - multi extremities []  - 0 Nutritional Assessment / Counseling / Intervention []  - 0 Lower Extremity Assessment (monofilament, tuning fork, pulses) []  - 0 Peripheral Arterial Disease Assessment (using hand held doppler) ASSESSMENTS - Ostomy and/or Continence Assessment and Care []  - 0 Incontinence Assessment and Management []  - 0 Ostomy Care Assessment and Management (repouching, etc.) PROCESS - Coordination of Care X - Simple Patient / Family Education for ongoing care 1 15 []  - 0 Complex (extensive) Patient / Family Education for ongoing care X- 1 10 Staff obtains Programmer, systems, Records, T Results / Process Orders est X- 1 10 Staff telephones HHA, Nursing Homes / Clarify orders / etc []  - 0 Routine Transfer to another Facility (non-emergent condition) []  - 0 Routine Hospital Admission (non-emergent condition) []  - 0 New Admissions / Biomedical engineer / Ordering NPWT Apligraf, etc. , []  - 0 Emergency Hospital Admission (emergent condition) X- 1 10 Simple Discharge Coordination []  - 0 Complex (extensive) Discharge Coordination PROCESS - Special Needs []  - 0 Pediatric / Minor Patient Management []  - 0 Isolation Patient Management []  - 0 Hearing / Language / Visual special needs []  - 0 Assessment of Community assistance (transportation, D/C planning, etc.) []  - 0 Additional assistance / Altered mentation []  - 0 Support  Surface(s) Assessment (bed, cushion, seat, etc.) INTERVENTIONS - Wound Cleansing / Measurement []  - 0 Simple Wound Cleansing - one wound X- 2 5 Complex Wound Cleansing - multiple wounds X-  1 5 Wound Imaging (photographs - any number of wounds) []  - 0 Wound Tracing (instead of photographs) []  - 0 Simple Wound Measurement - one wound X- 2 5 Complex Wound Measurement - multiple wounds INTERVENTIONS - Wound Dressings X - Small Wound Dressing one or multiple wounds 2 10 []  - 0 Medium Wound Dressing one or multiple wounds []  - 0 Large Wound Dressing one or multiple wounds []  - 0 Application of Medications - topical []  - 0 Application of Medications - injection INTERVENTIONS - Miscellaneous []  - 0 External ear exam []  - 0 Specimen Collection (cultures, biopsies, blood, body fluids, etc.) []  - 0 Specimen(s) / Culture(s) sent or taken to Lab for analysis []  - 0 Patient Transfer (multiple staff / Civil Service fast streamer / Similar devices) []  - 0 Simple Staple / Suture removal (25 or less) []  - 0 Complex Staple / Suture removal (26 or more) []  - 0 Hypo / Hyperglycemic Management (close monitor of Blood Glucose) []  - 0 Ankle / Brachial Index (ABI) - do not check if billed separately X- 1 5 Vital Signs Has the patient been seen at the hospital within the last three years: Yes Total Score: 120 Level Of Care: New/Established - Level 4 Electronic Signature(s) Signed: 02/18/2021 4:58:13 PM By: Levan Hurst RN, BSN Entered By: Levan Hurst on 02/17/2021 11:47:50 -------------------------------------------------------------------------------- Encounter Discharge Information Details Patient Name: Date of Service: [name] 02/17/2021 9:45 A M Medical Record Number: [id number] Patient Account Number: [id number] Date of Birth/Sex: Treating RN: 11/02/1935 (85 y.o. [name] Primary Care [name]: [name] Other Clinician: Referring [name]: Treating [name]/Extender:  Deloria Lair in Treatment: 19 Encounter Discharge Information Items Discharge Condition: Stable Ambulatory Status: Wheelchair Discharge Destination: Home Transportation: Private Auto Accompanied By: caregiver Schedule Follow-up Appointment: Yes Clinical Summary of Care: Patient Declined Electronic Signature(s) Signed: 02/18/2021 4:58:13 PM By: Levan Hurst RN, BSN Entered By: Levan Hurst on 02/17/2021 11:48:26 -------------------------------------------------------------------------------- Multi Wound Chart Details Patient Name: Date of Service: [name] 02/17/2021 9:45 A M Medical Record Number: [id number] Patient Account Number: [id number] Date of Birth/Sex: Treating RN: 25-Oct-1935 (85 y.o. [name] Primary Care Denzil Mceachron: [name] Other Clinician: Referring Ieasha Boerema: Treating Salim Forero/Extender: Deloria Lair in Treatment: 19 Vital Signs Height(in): 68 Capillary Blood Glucose(mg/dl): 106 Weight(lbs): 110 Pulse(bpm): 67 Body Mass Index(BMI): 17 Blood Pressure(mmHg): 141/91 Temperature(F): 97.6 Respiratory Rate(breaths/min): 16 Photos: [2:Left Gluteus] [3:Right, Lateral Lower Leg] [N/A:N/A N/A] Wound Location: [2:Pressure Injury] [3:Pressure Injury] [N/A:N/A] Wounding Event: [2:Pressure Ulcer] [3:Pressure Ulcer] [N/A:N/A] Primary Etiology: [2:Anemia, Deep Vein Thrombosis,] [3:Anemia, Deep Vein Thrombosis,] [N/A:N/A] Comorbid History: [2:Hypertension, Type II Diabetes, Dementia 09/18/2020] [3:Hypertension, Type II Diabetes, Dementia 12/16/2020] [N/A:N/A] Date Acquired: [2:19] [3:8] [N/A:N/A] Weeks of Treatment: [2:Open] [3:Open] [N/A:N/A] Wound Status: [2:5x3x3.3] [3:0.5x0.4x0.1] [N/A:N/A] Measurements L x W x D (cm) [2:11.781] [3:0.157] [N/A:N/A] A (cm) : rea [2:38.877] [3:0.016] [N/A:N/A] Volume (cm) : [2:71.70%] [3:92.20%] [N/A:N/A] % Reduction in A rea: [2:-833.20%] [3:97.30%] [N/A:N/A] %  Reduction in Volume: [2:12] Starting Position 1 (o'clock): [2:6] Ending Position 1 (o'clock): [2:4] Maximum Distance 1 (cm): [2:Yes] [3:No] [N/A:N/A] Undermining: [2:Category/Stage III] [3:Category/Stage III] [N/A:N/A] Classification: [2:Medium] [3:Small] [N/A:N/A] Exudate A mount: [2:Serosanguineous] [3:Serosanguineous] [N/A:N/A] Exudate Type: [2:red, brown] [3:red, brown] [N/A:N/A] Exudate Color: [2:Epibole] [3:Well defined, not attached] [N/A:N/A] Wound Margin: [2:Large (67-100%)] [3:Large (67-100%)] [N/A:N/A] Granulation A mount: [2:Red, Pink] [3:Red, Pink] [N/A:N/A] Granulation Quality: [2:Small (1-33%)] [3:Small (1-33%)] [N/A:N/A] Necrotic A mount: [2:Fat Layer (Subcutaneous Tissue): Yes Fat Layer (Subcutaneous Tissue): Yes N/A] Exposed Structures: [2:Fascia:  No Tendon: No Muscle: No Joint: No Bone: No Medium (34-66%)] [3:Fascia: No Tendon: No Muscle: No Joint: No Bone: No Large (67-100%)] [N/A:N/A] Treatment Notes Electronic Signature(s) Signed: 02/17/2021 4:37:06 PM By: Linton Ham MD Signed: 02/18/2021 4:58:13 PM By: Levan Hurst RN, BSN Entered By: Linton Ham on 02/17/2021 11:11:26 -------------------------------------------------------------------------------- [place] Details Patient Name: Date of Service: [name] 02/17/2021 9:45 A M Medical Record Number: [id number] Patient Account Number: [id number] Date of Birth/Sex: Treating RN: 02-04-1936 (85 y.o. [name] Primary Care Liborio Saccente: [name] Other Clinician: Referring Lattie Cervi: Treating Cozetta Seif/Extender: Deloria Lair in Treatment: 19 Active Inactive Wound/Skin Impairment Nursing Diagnoses: Impaired tissue integrity Goals: Patient/caregiver will verbalize understanding of skin care regimen Date Initiated: 10/02/2020 Target Resolution Date: 02/26/2021 Goal Status: Active Ulcer/skin breakdown will have a volume reduction of 30% by  week 4 Date Initiated: 10/02/2020 Date Inactivated: 01/06/2021 Target Resolution Date: 01/09/2021 Goal Status: Met Interventions: Assess patient/caregiver ability to obtain necessary supplies Assess patient/caregiver ability to perform ulcer/skin care regimen upon admission and as needed Assess ulceration(s) every visit Provide education on ulcer and skin care Treatment Activities: Skin care regimen initiated : 10/02/2020 Topical wound management initiated : 10/02/2020 Notes: 11/10/20 : Goal not yet met, eschar debrided off increasing wound size. Electronic Signature(s) Signed: 02/18/2021 4:58:13 PM By: Levan Hurst RN, BSN Entered By: Levan Hurst on 02/17/2021 10:17:35 -------------------------------------------------------------------------------- Pain Assessment Details Patient Name: Date of Service: TERRIONA, HORLACHER 02/17/2021 9:45 A M Medical Record Number: [id number] Patient Account Number: [id number] Date of Birth/Sex: Treating RN: [birth year] (85 y.o. [name] Primary Care Trinten Boudoin: [name] Other Clinician: Referring Rumaldo Difatta: Treating Anikin Prosser/Extender: Deloria Lair in Treatment: 19 Active Problems Location of Pain Severity and Description of Pain Patient Has Paino No Site Locations Pain Management and Medication Current Pain Management: Electronic Signature(s) Signed: 02/18/2021 4:58:13 PM By: Levan Hurst RN, BSN Entered By: Levan Hurst on 02/17/2021 10:02:01 -------------------------------------------------------------------------------- Patient/Caregiver Education Details Patient Name: Date of Service: [name] 10/12/2022andnbsp9:45 [place] Record Number: [id number] Patient Account Number: [id number] Date of Birth/Gender: Treating RN: 11-07-1935 (85 y.o. [name] Primary Care Physician: [name] Other Clinician: Referring Physician: Treating Physician/Extender: Deloria Lair in Treatment: 19 Education Assessment Education Provided To: Patient Education Topics Provided Wound/Skin Impairment: Methods: Explain/Verbal Responses: State content correctly [employer]) Signed: 02/18/2021 4:58:13 PM By: Levan Hurst RN, BSN Entered By: Levan Hurst on 02/17/2021 10:19:01 -------------------------------------------------------------------------------- Wound Assessment Details Patient Name: Date of Service: MARLAYSIA, LENIG 02/17/2021 9:45 A M Medical Record Number: [id number] Patient Account Number: [id number] Date of Birth/Sex: Treating RN: Sep 12, 1935 (85 y.o. [name] Primary Care Soraida Vickers: [name] Other Clinician: Referring Kaiulani Sitton: Treating Daren Yeagle/Extender: Deloria Lair in Treatment: 19 Wound Status Wound Number: 2 Primary Pressure Ulcer Etiology: Wound Location: Left Gluteus Wound Status: Open Wounding Event: Pressure Injury Comorbid Anemia, Deep Vein Thrombosis, Hypertension, Type II Date Acquired: 09/18/2020 History: Diabetes, Dementia Weeks Of Treatment: 19 Clustered Wound: No Photos Wound Measurements Length: (cm) 5 Width: (cm) 3 Depth: (cm) 3.3 Area: (cm) 11.781 Volume: (cm) 38.877 Wound Description Classification: Category/Stage III Wound Margin: Epibole Exudate Amount: Medium Exudate Type: Serosanguineous Exudate Color: red, brown Foul Odor After Cleansing: Slough/Fibrino % Reduction in Area: 71.7% % Reduction in Volume: -833.2% Epithelialization: Medium (34-66%) Tunneling: No Undermining: Yes Starting Position (o'clock): 12 Ending Position (o'clock): 6 Maximum Distance: (cm) 4 No Yes Wound Bed Granulation Amount: Large (67-100%) Exposed Structure Granulation Quality: Red,  Pink Fascia Exposed: No Necrotic Amount: Small (1-33%) Fat Layer (Subcutaneous Tissue) Exposed: Yes Necrotic Quality: Adherent Slough Tendon Exposed:  No Muscle Exposed: No Joint Exposed: No Bone Exposed: No Treatment Notes Wound #2 (Gluteus) Wound Laterality: Left Cleanser Soap and Water Discharge Instruction: May shower and wash wound with dial antibacterial soap and water prior to dressing change. Peri-Wound Care Topical Primary Dressing KerraCel Ag Gelling Fiber Dressing, 4x5 in (silver alginate) Discharge Instruction: Apply silver alginate to wound bed as instructed Secondary Dressing Woven Gauze Sponge, Non-Sterile 4x4 in Discharge Instruction: Dry gauze backing over alginate ABD Pad, 8x10 Discharge Instruction: Apply over primary dressing as directed. Secured With 11M Medipore H Soft Cloth Surgical T 4 x 2 (in/yd) ape Discharge Instruction: Secure dressing with tape as directed. Compression Wrap Compression Stockings Add-Ons Electronic Signature(s) Signed: 02/18/2021 4:58:13 PM By: Levan Hurst RN, BSN Entered By: Levan Hurst on 02/17/2021 10:13:17 -------------------------------------------------------------------------------- Wound Assessment Details Patient Name: Date of Service: JAMECIA, LERMAN 02/17/2021 9:45 A M Medical Record Number: [id number] Patient Account Number: [id number] Date of Birth/Sex: Treating RN: [birth year] (85 y.o. [name] Primary Care Baby Gieger: [name] Other Clinician: Referring Titan Karner: Treating Yer Olivencia/Extender: Deloria Lair in Treatment: 19 Wound Status Wound Number: 3 Primary Pressure Ulcer Etiology: Wound Location: Right, Lateral Lower Leg Wound Status: Open Wounding Event: Pressure Injury Comorbid Anemia, Deep Vein Thrombosis, Hypertension, Type II Date Acquired: 12/16/2020 History: Diabetes, Dementia Weeks Of Treatment: 8 Clustered Wound: No Photos Wound Measurements Length: (cm) 0.5 Width: (cm) 0.4 Depth: (cm) 0.1 Area: (cm) 0.157 Volume: (cm) 0.016 % Reduction in Area: 92.2% % Reduction in Volume:  97.3% Epithelialization: Large (67-100%) Tunneling: No Undermining: No Wound Description Classification: Category/Stage III Wound Margin: Well defined, not attached Exudate Amount: Small Exudate Type: Serosanguineous Exudate Color: red, brown Foul Odor After Cleansing: No Slough/Fibrino Yes Wound Bed Granulation Amount: Large (67-100%) Exposed Structure Granulation Quality: Red, Pink Fascia Exposed: No Necrotic Amount: Small (1-33%) Fat Layer (Subcutaneous Tissue) Exposed: Yes Necrotic Quality: Adherent Slough Tendon Exposed: No Muscle Exposed: No Joint Exposed: No Bone Exposed: No Treatment Notes Wound #3 (Lower Leg) Wound Laterality: Right, Lateral Cleanser Soap and Water Discharge Instruction: May shower and wash wound with dial antibacterial soap and water prior to dressing change. Wound Cleanser Discharge Instruction: Cleanse the wound with wound cleanser prior to applying a clean dressing using gauze sponges, not tissue or cotton balls. Peri-Wound Care Topical Primary Dressing Hydrofera Blue Ready Foam, 2.5 x2.5 in Discharge Instruction: Apply to wound bed as instructed Secondary Dressing Zetuvit Plus Silicone Border Dressing 4x4 (in/in) Discharge Instruction: Apply silicone border over primary dressing as directed. Secured With Compression Wrap Compression Stockings Environmental education officer) Signed: 02/18/2021 4:58:13 PM By: Levan Hurst RN, BSN Entered By: Levan Hurst on 02/17/2021 10:06:10 -------------------------------------------------------------------------------- Vitals Details Patient Name: Date of Service: [name] 02/17/2021 9:45 A M Medical Record Number: [id number] Patient Account Number: [id number] Date of Birth/Sex: Treating RN: 1936-01-25 (85 y.o. [name] Primary Care Orey Moure: [name] Other Clinician: Referring Kholton Coate: Treating Daryn Pisani/Extender: Deloria Lair in Treatment:  19 Vital Signs Time Taken: 10:00 Temperature (F): 97.6 Height (in): 68 Pulse (bpm): 91 Weight (lbs): 110 Respiratory Rate (breaths/min): 16 Body Mass Index (BMI): 16.7 Blood Pressure (mmHg): 141/91 Capillary Blood Glucose (mg/dl): 106 Reference Range: 80 - 120 mg / dl Notes glucose per pt report Electronic Signature(s) Signed: 02/18/2021 4:58:13 PM By: Levan Hurst RN, BSN Entered By: Levan Hurst on 02/17/2021 10:01:49

## 2021-02-18 NOTE — Progress Notes (Signed)
[name] ([id number]) , Visit Report for 02/17/2021 HPI Details Patient Name: Date of Service: [name], [name] 02/17/2021 9:45 A M Medical Record Number: [id number] Patient Account Number: [id number] Date of Birth/Sex: Treating RN: 1935-11-24 (85 y.o. [name] Primary Care Provider: [name] Other Clinician: Referring Provider: Treating Provider/Extender: Deloria Lair in Treatment: 72 History of Present Illness HPI Description: Admission 5/27 [name] Standard is an 85 year old female with a past medical history of type 2 diabetes on oral agents, hypertension and dementia that presents today to the clinic for a wound to her right buttocks. Her daughter-in-law is present today and helps provide the history. This issue has been going on for 2 to 3 weeks now. She states she developed an eschar about 1 week ago. She has been taking doxycycline for possible soft tissue infection to the area prescribed by her primary care provider. Patient currently denies any signs of infection. 6/3; patient admitted to the clinic last week. She has a large necrotic area on her left buttock. They use Santyl last week. On arrival this visit she had completely necrotic surface with open to subcutaneous tissue. The surface was completely nonviable. They have been using Santyl. She apparently had been on doxycycline which she is just finishing prescribed by her primary doctor for possible soft tissue infection 6/7; patient with a large necrotic wound over her left buttock. Aggressive debridement last week. Culture of this grew Proteus unfortunately would not of been covered well or at least predictably by the doxycycline that her primary doctor put her on. I have therefore put her on Augmentin suspension 3 times a day. We are using silver alginate as the primary dressing while we deal with the infection. Her daughter has a list of concerns including swallowing  difficulties, concerns about aspiration. The patient has advanced dementia. If this is Alzheimer's disease it is at its preterminal stages. I went over that swallowing difficulties are part of what happens in the preterminal stages of Alzheimer's disease. Nevertheless we will family members seems to want to pursue a very aggressive course 6/21 large wound over her left buttock. She has completed the antibiotics I gave her [Augmentin]. We are using silver alginate while we are dealing with the infection and also to help with the drainage In general the wound looks better she has healthy granulation over perhaps 70% of it. Superiorly there is still necrotic debris I did not attempt to debride this today The patient has advanced dementia. I do not know that she could tolerate a wound VAC. She also has double incontinence which would make that difficult. Nevertheless she has been cared for diligently by her family. She is apparently eating well and may be 2 to 3 hours on this area all day 7/5; 2-week follow-up. She continues with a nice improvement in overall condition of the wound bed. The undermining area has still some adherent surface slough. We have been using silver alginate because of the drainage and possibility of at least superficial wound infection/colonization. Although I said in previous notes I wondered whether she could tolerate the wound VAC they have done so well with this wound at home I do not want to necessarily rule her out for a wound VAC and I am going to direct the start of a trial of wound VAC therapy if this can be covered by insurance, if we have home health support to change it at all 7/19; we did not get very far with the wound  VAC because my original ICD 10 said this was unstageable. As usual this represents a considerable frustration because we would not of ordered a wound VAC if the wound was still unstageable. This currently represents a stage IV staging. This is all the  way down through muscle layer. There is a rim of tissue over the bone and no palpable exposed bone but this is a deep wound. Fortunately at present no evidence of infection. We have been using silver collagen with backing wet-to-dry. The wound is really no better today but no worse 8/2; patient presents for 2-week follow-up. She has been using the wound VAC for the past week. There has been some issues with keeping the drape in place however home health comes out to reapply the drape when this happens. Overall there are no issues or complaints today. No signs of infection. 8/17; patient has been using the wound VAC on her wound on the upper left buttock. About 50 to 60% of this area is fully granulated however from about 10-5 o'clock there is still undermining. UNFORTUNATELY she also arrives in clinic today with a wound over her right fibular head. According to her daughter has been there for about 2 weeks. She wonders whether there is an abrasion although no concrete history of this. Given her frail status this is probably most likely a pressure ulcer over a bony prominence [fibular head] 8/31 unfortunately the upper left buttock pressure ulcer stage IV is not as optimistically better as I was hoping. The granulation that I identified on 12/23/20 is no longer adherent at the inferior pole the undermining is quite extensive but there is no palpable bone. No evidence of surrounding infection. There may be some improvement in the undermining measurements We are still dealing with the additional wound we identified 2 weeks ago on the right fibular head as well we have been using silver alginate here 9/14; 1 month follow-up. She has now had the wound VAC on for probably 2 months. No major change here if she still has the tunneling area at 12:00 and undermining from about 6-12. Surface of the wound does not look healthy. We use MolecuLight to look at the wound surface which actually looked negative however  she has extensive immunofluorescence in the wound edge from about 12-6 o'clock this actually extends beyond the visible wound margin by quite amount. 9/27; no major change in the left buttock wound. There is still undermining however no exposed bone. Granulation looks reasonable. Right fibular head is much smaller We are using Hydrofera Blue on the right fibular head silver alginate on the buttock area 10/12; left buttock wound is large with undermining. I think this is about the same as last time granulation looks reasonable there is no exposed bone. The area on the fibular head is almost completely closed Electronic Signature(s) Signed: 02/17/2021 4:37:06 PM By: Linton Ham MD Signed: 02/17/2021 4:37:06 PM By: Linton Ham MD Entered By: Linton Ham on 02/17/2021 11:12:00 -------------------------------------------------------------------------------- Physical Exam Details Patient Name: Date of Service: PARTHENA, FERGESON 02/17/2021 9:45 A M Medical Record Number: [id number] Patient Account Number: [id number] Date of Birth/Sex: Treating RN: 1935-12-31 (85 y.o. [name] Primary Care Provider: [name] Other Clinician: Referring Provider: Treating Provider/Extender: Deloria Lair in Treatment: 19 Constitutional Patient is hypertensive.. Pulse regular and within target range for patient.Marland Kitchen Respirations regular, non-labored and within target range.. Temperature is normal and within the target range for the patient.Marland Kitchen Appears in no distress. Notes Wound exam; left  buttock. Substantial wound over the ischial tuberosity there is undermining however no exposed bone. The granulation looks generally very healthy. Overall no change from when we last saw this patient. The area over the right fibular head which was also a pressure wound is almost completely closed. We are using Chrys Racer here Electronic Signature(s) Signed: 02/17/2021 4:37:06 PM  By: Linton Ham MD Entered By: Linton Ham on 02/17/2021 11:12:56 -------------------------------------------------------------------------------- Physician Orders Details Patient Name: Date of Service: MAUDEAN, HOFFMANN 02/17/2021 9:45 A M Medical Record Number: [id number] Patient Account Number: [id number] Date of Birth/Sex: Treating RN: 21-May-1935 (85 y.o. [name] Primary Care Provider: [name] Other Clinician: Referring Provider: Treating Provider/Extender: Deloria Lair in Treatment: 19 Verbal / Phone Orders: No Diagnosis Coding ICD-10 Coding Code Description [id number] Pressure ulcer of left buttock, stage 4 E11.9 Type 2 diabetes mellitus without complications K24.09 Pressure ulcer of unspecified site, stage 3 Follow-up Appointments ppointment in: - 6 weeks with Dr. Dellia Nims Return A Bathing/ Shower/ Hygiene May shower and wash wound with soap and water. - prior to dressing change Off-Loading Low air-loss mattress (Group 2) Turn and reposition every 2 hours Additional Orders / Instructions Follow Nutritious Diet - 100-120g of Protein Home Health No change in wound care orders this week; continue Home Health for wound care. May utilize formulary equivalent dressing for wound treatment orders unless otherwise specified. Other Home Health Orders/Instructions: - [facility] Wound Treatment Wound #2 - Gluteus Wound Laterality: Left Cleanser: Soap and Water (Home Health) 1 x Per Day/30 Days Discharge Instructions: May shower and wash wound with dial antibacterial soap and water prior to dressing change. Prim Dressing: KerraCel Ag Gelling Fiber Dressing, 4x5 in (silver alginate) (Home Health) 1 x Per Day/30 Days ary Discharge Instructions: Apply silver alginate to wound bed as instructed Secondary Dressing: Woven Gauze Sponge, Non-Sterile 4x4 in (Home Health) 1 x Per Day/30 Days Discharge Instructions: Dry gauze backing over  alginate Secondary Dressing: ABD Pad, 8x10 (Home Health) 1 x Per Day/30 Days Discharge Instructions: Apply over primary dressing as directed. Secured With: 31M Medipore H Soft Cloth Surgical T 4 x 2 (in/yd) (Home Health) 1 x Per Day/30 Days ape Discharge Instructions: Secure dressing with tape as directed. Wound #3 - Lower Leg Wound Laterality: Right, Lateral Cleanser: Soap and Water [facility]) Every Other Day/30 Days Discharge Instructions: May shower and wash wound with dial antibacterial soap and water prior to dressing change. Cleanser: Wound Cleanser [facility]) Every Other Day/30 Days Discharge Instructions: Cleanse the wound with wound cleanser prior to applying a clean dressing using gauze sponges, not tissue or cotton balls. Prim Dressing: Hydrofera Blue Ready Foam, 2.5 x2.5 in [facility]) Every Other Day/30 Days ary Discharge Instructions: Apply to wound bed as instructed Secondary Dressing: Zetuvit Plus Silicone Border Dressing 4x4 (in/in) ([place]) Every Other Day/30 Days Discharge Instructions: Apply silicone border over primary dressing as directed. Electronic Signature(s) Signed: 02/17/2021 4:37:06 PM By: Linton Ham MD Signed: 02/18/2021 4:58:13 PM By: Levan Hurst RN, BSN Entered By: Levan Hurst on 02/17/2021 10:28:13 -------------------------------------------------------------------------------- Problem List Details Patient Name: Date of Service: RHENA, GLACE 02/17/2021 9:45 A M Medical Record Number: [id number] Patient Account Number: [id number] Date of Birth/Sex: Treating RN: 04-Sep-1935 (85 y.o. [name] Primary Care Provider: [name] Other Clinician: Referring Provider: Treating Provider/Extender: Deloria Lair in Treatment: 19 Active Problems ICD-10 Encounter Code Description Active Date MDM Diagnosis L89.324 Pressure ulcer of left buttock, stage 4 11/24/2020 No Yes  E11.9 Type 2 diabetes  mellitus without complications 05/10/9415 No Yes L89.93 Pressure ulcer of unspecified site, stage 3 12/23/2020 No Yes Inactive Problems ICD-10 Code Description Active Date Inactive Date L89.320 Pressure ulcer of left buttock, unstageable 10/13/2020 10/13/2020 Resolved Problems Electronic Signature(s) Signed: 02/17/2021 4:37:06 PM By: Linton Ham MD Entered By: Linton Ham on 02/17/2021 11:11:18 -------------------------------------------------------------------------------- Progress Note Details Patient Name: Date of Service: [name] 02/17/2021 9:45 A M Medical Record Number: [id number] Patient Account Number: [id number] Date of Birth/Sex: Treating RN: 02/10/1936 (85 y.o. [name] Primary Care Provider: [name] Other Clinician: Referring Provider: Treating Provider/Extender: Deloria Lair in Treatment: 19 Subjective History of Present Illness (HPI) Admission 5/27 Ms. Ariannie Penaloza is an 85 year old female with a past medical history of type 2 diabetes on oral agents, hypertension and dementia that presents today to the clinic for a wound to her right buttocks. Her daughter-in-law is present today and helps provide the history. This issue has been going on for 2 to 3 weeks now. She states she developed an eschar about 1 week ago. She has been taking doxycycline for possible soft tissue infection to the area prescribed by her primary care provider. Patient currently denies any signs of infection. 6/3; patient admitted to the clinic last week. She has a large necrotic area on her left buttock. They use Santyl last week. On arrival this visit she had completely necrotic surface with open to subcutaneous tissue. The surface was completely nonviable. They have been using Santyl. She apparently had been on doxycycline which she is just finishing prescribed by her primary doctor for possible soft tissue infection 6/7; patient with a large  necrotic wound over her left buttock. Aggressive debridement last week. Culture of this grew Proteus unfortunately would not of been covered well or at least predictably by the doxycycline that her primary doctor put her on. I have therefore put her on Augmentin suspension 3 times a day. We are using silver alginate as the primary dressing while we deal with the infection. Her daughter has a list of concerns including swallowing difficulties, concerns about aspiration. The patient has advanced dementia. If this is Alzheimer's disease it is at its preterminal stages. I went over that swallowing difficulties are part of what happens in the preterminal stages of Alzheimer's disease. Nevertheless we will family members seems to want to pursue a very aggressive course 6/21 large wound over her left buttock. She has completed the antibiotics I gave her [Augmentin]. We are using silver alginate while we are dealing with the infection and also to help with the drainage In general the wound looks better she has healthy granulation over perhaps 70% of it. Superiorly there is still necrotic debris I did not attempt to debride this today The patient has advanced dementia. I do not know that she could tolerate a wound VAC. She also has double incontinence which would make that difficult. Nevertheless she has been cared for diligently by her family. She is apparently eating well and may be 2 to 3 hours on this area all day 7/5; 2-week follow-up. She continues with a nice improvement in overall condition of the wound bed. The undermining area has still some adherent surface slough. We have been using silver alginate because of the drainage and possibility of at least superficial wound infection/colonization. Although I said in previous notes I wondered whether she could tolerate the wound VAC they have done so well with this wound at home I do not  want to necessarily rule her out for a wound VAC and I am going to  direct the start of a trial of wound VAC therapy if this can be covered by insurance, if we have home health support to change it at all 7/19; we did not get very far with the wound VAC because my original ICD 10 said this was unstageable. As usual this represents a considerable frustration because we would not of ordered a wound VAC if the wound was still unstageable. This currently represents a stage IV staging. This is all the way down through muscle layer. There is a rim of tissue over the bone and no palpable exposed bone but this is a deep wound. Fortunately at present no evidence of infection. We have been using silver collagen with backing wet-to-dry. The wound is really no better today but no worse 8/2; patient presents for 2-week follow-up. She has been using the wound VAC for the past week. There has been some issues with keeping the drape in place however home health comes out to reapply the drape when this happens. Overall there are no issues or complaints today. No signs of infection. 8/17; patient has been using the wound VAC on her wound on the upper left buttock. About 50 to 60% of this area is fully granulated however from about 10-5 o'clock there is still undermining. UNFORTUNATELY she also arrives in clinic today with a wound over her right fibular head. According to her daughter has been there for about 2 weeks. She wonders whether there is an abrasion although no concrete history of this. Given her frail status this is probably most likely a pressure ulcer over a bony prominence [fibular head] 8/31 unfortunately the upper left buttock pressure ulcer stage IV is not as optimistically better as I was hoping. The granulation that I identified on 12/23/20 is no longer adherent at the inferior pole the undermining is quite extensive but there is no palpable bone. No evidence of surrounding infection. There may be some improvement in the undermining measurements We are still dealing  with the additional wound we identified 2 weeks ago on the right fibular head as well we have been using silver alginate here 9/14; 1 month follow-up. She has now had the wound VAC on for probably 2 months. No major change here if she still has the tunneling area at 12:00 and undermining from about 6-12. Surface of the wound does not look healthy. We use MolecuLight to look at the wound surface which actually looked negative however she has extensive immunofluorescence in the wound edge from about 12-6 o'clock this actually extends beyond the visible wound margin by quite amount. 9/27; no major change in the left buttock wound. There is still undermining however no exposed bone. Granulation looks reasonable. oo Right fibular head is much smaller We are using Hydrofera Blue on the right fibular head silver alginate on the buttock area 10/12; left buttock wound is large with undermining. I think this is about the same as last time granulation looks reasonable there is no exposed bone. The area on the fibular head is almost completely closed Objective Constitutional Patient is hypertensive.. Pulse regular and within target range for patient.Marland Kitchen Respirations regular, non-labored and within target range.. Temperature is normal and within the target range for the patient.Marland Kitchen Appears in no distress. Vitals Time Taken: 10:00 AM, Height: 68 in, Weight: 110 lbs, BMI: 16.7, Temperature: 97.6 F, Pulse: 91 bpm, Respiratory Rate: 16 breaths/min, Blood Pressure: 141/91 mmHg, Capillary  Blood Glucose: 106 mg/dl. General Notes: glucose per pt report General Notes: Wound exam; left buttock. Substantial wound over the ischial tuberosity there is undermining however no exposed bone. The granulation looks generally very healthy. Overall no change from when we last saw this patient. oo The area over the right fibular head which was also a pressure wound is almost completely closed. We are using Hydrofera Blue  here Integumentary (Hair, Skin) Wound #2 status is Open. Original cause of wound was Pressure Injury. The date acquired was: 09/18/2020. The wound has been in treatment 19 weeks. The wound is located on the Left Gluteus. The wound measures 5cm length x 3cm width x 3.3cm depth; 11.781cm^2 area and 38.877cm^3 volume. There is Fat Layer (Subcutaneous Tissue) exposed. There is no tunneling noted, however, there is undermining starting at 12:00 and ending at 6:00 with a maximum distance of 4cm. There is a medium amount of serosanguineous drainage noted. The wound margin is epibole. There is large (67-100%) red, pink granulation within the wound bed. There is a small (1-33%) amount of necrotic tissue within the wound bed including Adherent Slough. Wound #3 status is Open. Original cause of wound was Pressure Injury. The date acquired was: 12/16/2020. The wound has been in treatment 8 weeks. The wound is located on the Right,Lateral Lower Leg. The wound measures 0.5cm length x 0.4cm width x 0.1cm depth; 0.157cm^2 area and 0.016cm^3 volume. There is Fat Layer (Subcutaneous Tissue) exposed. There is no tunneling or undermining noted. There is a small amount of serosanguineous drainage noted. The wound margin is well defined and not attached to the wound base. There is large (67-100%) red, pink granulation within the wound bed. There is a small (1- 33%) amount of necrotic tissue within the wound bed including Adherent Slough. Assessment Active Problems ICD-10 Pressure ulcer of left buttock, stage 4 Type 2 diabetes mellitus without complications Pressure ulcer of unspecified site, stage 3 Plan Follow-up Appointments: Return Appointment in: - 6 weeks with Dr. Dellia Nims Bathing/ Shower/ Hygiene: May shower and wash wound with soap and water. - prior to dressing change Off-Loading: Low air-loss mattress (Group 2) Turn and reposition every 2 hours Additional Orders / Instructions: Follow Nutritious Diet -  100-120g of Protein Home Health: No change in wound care orders this week; continue Home Health for wound care. May utilize formulary equivalent dressing for wound treatment orders unless otherwise specified. Other Home Health Orders/Instructions: - [facility] WOUND #2: - Gluteus Wound Laterality: Left Cleanser: Soap and Water (Home Health) 1 x Per Day/30 Days Discharge Instructions: May shower and wash wound with dial antibacterial soap and water prior to dressing change. Prim Dressing: KerraCel Ag Gelling Fiber Dressing, 4x5 in (silver alginate) (Home Health) 1 x Per Day/30 Days ary Discharge Instructions: Apply silver alginate to wound bed as instructed Secondary Dressing: Woven Gauze Sponge, Non-Sterile 4x4 in (Home Health) 1 x Per Day/30 Days Discharge Instructions: Dry gauze backing over alginate Secondary Dressing: ABD Pad, 8x10 (Home Health) 1 x Per Day/30 Days Discharge Instructions: Apply over primary dressing as directed. Secured With: 39M Medipore H Soft Cloth Surgical T 4 x 2 (in/yd) (Home Health) 1 x Per Day/30 Days ape Discharge Instructions: Secure dressing with tape as directed. WOUND #3: - Lower Leg Wound Laterality: Right, Lateral Cleanser: Soap and Water [facility]) Every Other Day/30 Days Discharge Instructions: May shower and wash wound with dial antibacterial soap and water prior to dressing change. Cleanser: Wound Cleanser [facility]) Every Other Day/30 Days Discharge Instructions: Cleanse  the wound with wound cleanser prior to applying a clean dressing using gauze sponges, not tissue or cotton balls. Prim Dressing: Hydrofera Blue Ready Foam, 2.5 x2.5 in [facility]) Every Other Day/30 Days ary Discharge Instructions: Apply to wound bed as instructed Secondary Dressing: Zetuvit Plus Silicone Border Dressing 4x4 (in/in) ([place]) Every Other Day/30 Days Discharge Instructions: Apply silicone border over primary dressing as directed. 1. I am continuing with the  Hydrofera Blue to the fibular head this should be Closed by the next time we see her 2. The ischial tuberosity wound is about the same. And in this case I find no changes actually good news. 3. This is a palliative/complex case this is not going to close. We have explained this previously Electronic Signature(s) Signed: 02/17/2021 4:37:06 PM By: Linton Ham MD Entered By: Linton Ham on 02/17/2021 11:13:50 -------------------------------------------------------------------------------- SuperBill Details Patient Name: Date of Service: [name] 02/17/2021 Medical Record Number: [id number] Patient Account Number: [id number] Date of Birth/Sex: Treating RN: 1936-03-18 (85 y.o. [name] Primary Care Provider: [name] Other Clinician: Referring Provider: Treating Provider/Extender: Deloria Lair in Treatment: 19 Diagnosis Coding ICD-10 Codes Code Description [phone] Pressure ulcer of left buttock, stage 4 E11.9 Type 2 diabetes mellitus without complications O75.64 Pressure ulcer of unspecified site, stage 3 Facility Procedures CPT4 Code: [id number] Description: 99214 - WOUND CARE VISIT-LEV 4 EST PT Modifier: Quantity: 1 Physician Procedures : CPT4 Code Description Modifier [id number] - WC PHYS LEVEL 3 - EST PT ICD-10 Diagnosis Description L89.324 Pressure ulcer of left buttock, stage 4 L89.93 Pressure ulcer of unspecified site, stage 3 E11.9 Type 2 diabetes mellitus without complications Quantity: 1 Electronic Signature(s) Signed: 02/17/2021 4:37:06 PM By: Linton Ham MD Signed: 02/18/2021 4:58:13 PM By: Levan Hurst RN, BSN Entered By: Levan Hurst on 02/17/2021 11:47:56

## 2021-02-19 ENCOUNTER — Telehealth: Payer: Self-pay

## 2021-02-19 DIAGNOSIS — L89893 Pressure ulcer of other site, stage 3: Secondary | ICD-10-CM | POA: Diagnosis not present

## 2021-02-19 DIAGNOSIS — F0390 Unspecified dementia without behavioral disturbance: Secondary | ICD-10-CM | POA: Diagnosis not present

## 2021-02-19 DIAGNOSIS — I890 Lymphedema, not elsewhere classified: Secondary | ICD-10-CM | POA: Diagnosis not present

## 2021-02-19 DIAGNOSIS — E119 Type 2 diabetes mellitus without complications: Secondary | ICD-10-CM | POA: Diagnosis not present

## 2021-02-19 DIAGNOSIS — E785 Hyperlipidemia, unspecified: Secondary | ICD-10-CM | POA: Diagnosis not present

## 2021-02-19 DIAGNOSIS — L89320 Pressure ulcer of left buttock, unstageable: Secondary | ICD-10-CM | POA: Diagnosis not present

## 2021-02-19 NOTE — Telephone Encounter (Signed)
[name] with [facility] states on the patient's right eye about a cm away from the outer eye there is a hard bump that came up yesterday. Today, there is swelling all around the eye.  The eyeball itself looks fine. No drainage. To Joelene Millin, NP

## 2021-02-19 NOTE — Telephone Encounter (Signed)
She will need this evaluated with appt

## 2021-02-19 NOTE — Telephone Encounter (Signed)
DW/ Kathlee Nations. Will call daughter [name] to schedule.   Apnt scheduled.

## 2021-02-22 ENCOUNTER — Other Ambulatory Visit: Payer: Self-pay

## 2021-02-22 ENCOUNTER — Encounter: Payer: Medicare Other | Admitting: Family

## 2021-02-22 NOTE — Progress Notes (Signed)
  This encounter was created in error - please disregard. Unable to connect on video rescheduled to in office appointment.

## 2021-02-23 DIAGNOSIS — L89893 Pressure ulcer of other site, stage 3: Secondary | ICD-10-CM | POA: Diagnosis not present

## 2021-02-23 DIAGNOSIS — F0390 Unspecified dementia without behavioral disturbance: Secondary | ICD-10-CM | POA: Diagnosis not present

## 2021-02-23 DIAGNOSIS — E119 Type 2 diabetes mellitus without complications: Secondary | ICD-10-CM | POA: Diagnosis not present

## 2021-02-23 DIAGNOSIS — L89320 Pressure ulcer of left buttock, unstageable: Secondary | ICD-10-CM | POA: Diagnosis not present

## 2021-02-23 DIAGNOSIS — E785 Hyperlipidemia, unspecified: Secondary | ICD-10-CM | POA: Diagnosis not present

## 2021-02-23 DIAGNOSIS — I890 Lymphedema, not elsewhere classified: Secondary | ICD-10-CM | POA: Diagnosis not present

## 2021-02-24 ENCOUNTER — Other Ambulatory Visit: Payer: Self-pay

## 2021-02-24 ENCOUNTER — Ambulatory Visit (INDEPENDENT_AMBULATORY_CARE_PROVIDER_SITE_OTHER): Payer: Medicare Other | Admitting: Family

## 2021-02-24 VITALS — BP 130/80 | HR 70 | Temp 97.5°F

## 2021-02-24 DIAGNOSIS — L03211 Cellulitis of face: Secondary | ICD-10-CM

## 2021-02-24 MED ORDER — DOXYCYCLINE HYCLATE 100 MG PO TABS: 100.0000 mg | ORAL_TABLET | Freq: Two times a day (BID) | ORAL | 0 refills | Status: AC

## 2021-02-24 NOTE — Patient Instructions (Signed)
Cleanse right face abscess site with saline,pat dry,apply triple antibiotic ointment and cover with band aid daily until resolve .Notify provider if symptoms worsen or fail to improve

## 2021-02-24 NOTE — Progress Notes (Signed)
Provider: Lytle Malburg FNP-C  [name], NP  Patient Care Team: [name], NP as PCP - General (Geriatric Medicine) [name], MD as Consulting Physician (Oncology) [name], [name], LCSW as Social Worker (Licensed Clinical Social Worker) [name], RN as Registered Nurse [facility] and [facility]iative [facility])  Extended Emergency Contact Information Primary Emergency Contact: Sorlie,[name] Address: [address]          [place], [place] [address] Johnnette Litter of [place] Phone: [phone] Mobile Phone: [phone] Relation: Relative Secondary Emergency Contact: Cottie Banda Address: [place], [place] [address] Johnnette Litter of [place] Phone: [phone] Relation: Son  Code Status:  DNR Goals of care: Advanced Directive information Advanced Directives 02/24/2021  Does Patient Have a Medical Advance Directive? Yes  Type of Paramedic of [place];Out of facility DNR (pink MOST or yellow form)  Does patient want to make changes to medical advance directive? No - Patient declined  Copy of [place] in Chart? Yes - validated most recent copy scanned in chart (See row information)  Pre-existing out of facility DNR order (yellow form or pink MOST form) Pink MOST form placed in chart (order not valid for inpatient use);Yellow form placed in chart (order not valid for inpatient use)     Chief Complaint  Patient presents with   Acute Visit    Right eye swelling. Bump patient had on eye that has seem to drain. Eye seems like it's getting better.Would like to have nails looked at. Patient is starting to suck on fingers lately. Not sure if she has fungus on nails. No redness. Eye seems to be sensitive to touch.   Health Maintenance    Pneumonia vaccine,eye exam, 2nd COVID booster    HPI:  Pt is a 85 y.o. female seen today for an acute visit for evaluation of right eye swelling x 1  week.started as a hard bump and opened to a head.Has been draining so the swelling has gone down.No fever or chills.No vision impair ment. She is here with daughter who provides most HPI information.     Past Medical History:  Diagnosis Date   Dementia ([place])    Diabetes mellitus type II 03/21/2011   DVT (deep venous thrombosis) ([place]) 03/21/2011   Hyperlipidemia 03/21/2011   Hypertension 03/21/2011   Lymphedema of leg 03/21/2011   Pulmonary embolism ([place]) 03/21/2011   Stroke [facility])    Past Surgical History:  Procedure Laterality Date   CHOLECYSTECTOMY  2015   SKIN TAG REMOVAL  07/25/2016   VAGINAL HYSTERECTOMY     unknown of date   VASCULAR SURGERY      No Known Allergies  Outpatient Encounter Medications as of 02/24/2021  Medication Sig   A&D OINT Apply 1 application topically as needed.    Accu-Chek Softclix Lancets lancets 1 each by Other route 2 (two) times daily. Use as instructed Dx: E11.9   acetaminophen (TYLENOL) 500 MG tablet Take 500 mg by mouth every 8 (eight) hours as needed (for general pain).    atorvastatin (LIPITOR) 40 MG tablet Take 1 tablet (40 mg total) by mouth daily at 6 PM.   Cholecalciferol (VITAMIN D-3) 125 MCG (5000 UT) TABS Take 5,000 Units by mouth daily.   cloNIDine (CATAPRES) 0.1 MG tablet Take 1 tablet (0.1 mg total) by mouth 2 (two) times daily. May take an additional tablet if needed SBP >170   gabapentin (NEURONTIN)  300 MG capsule Take 1 capsule (300 mg total) by mouth 2 (two) times daily.   glucose blood (ACCU-CHEK AVIVA PLUS) test strip USE STRIP(S) TO TEST TWICE A DAY - (KEEP UNUSED STRIPS IN ORIGINAL SEALED CONTAINER BETWEEN USES)   losartan (COZAAR) 25 MG tablet Take one tablet by mouth once daily, take along with 50mg  to equal 75mg    losartan (COZAAR) 50 MG tablet Take one tablet by mouth once daily, along with 25mg  to equal 75mg    metFORMIN (GLUCOPHAGE) 500 MG tablet TAKE ONE TABLET BY MOUTH TWICE A DAY WITH MEALS   NON FORMULARY Take 1  capsule by mouth See admin instructions. Metagenics - UltraFlora Balance Daily Probiotic Immune Support* capsules- Take 1 capsule by mouth once a day   polyethylene glycol (MIRALAX / GLYCOLAX) packet Take 17 g by mouth daily as needed for mild constipation (MIX AND DRINK).    Rivaroxaban (XARELTO) 15 MG TABS tablet Take one tablet by mouth once daily with supper   zinc gluconate 50 MG tablet Take 50 mg by mouth daily.   No facility-administered encounter medications on file as of 02/24/2021.    Review of Systems  Constitutional:  Negative for appetite change, chills, fatigue, fever and unexpected weight change.  HENT:  Positive for hearing loss. Negative for congestion, dental problem, ear discharge, ear pain, facial swelling, nosebleeds, postnasal drip, rhinorrhea, sinus pressure, sinus pain, sneezing, sore throat and tinnitus.   Eyes:  Negative for pain, discharge, redness, itching and visual disturbance.       Swelling below right eyelid   Respiratory:  Negative for cough, chest tightness, shortness of breath and wheezing.   Cardiovascular:  Positive for leg swelling. Negative for chest pain and palpitations.  Gastrointestinal:  Negative for vomiting.  Musculoskeletal:  Negative for arthralgias, back pain, gait problem, joint swelling, myalgias, neck pain and neck stiffness.  Skin:  Negative for color change, pallor and rash.       Sacral dressing managed by [facility]   Neurological:  Negative for dizziness, syncope, speech difficulty, light-headedness, numbness and headaches.  Hematological:  Does not bruise/bleed easily.  Psychiatric/Behavioral:  Negative for agitation, behavioral problems, confusion, hallucinations and sleep disturbance. The patient is not nervous/anxious.    Immunization History  Administered Date(s) Administered   PFIZER Comirnaty(Gray Top)Covid-19 Tri-Sucrose Vaccine 06/30/2020   PFIZER(Purple Top)SARS-COV-2 Vaccination 11/07/2019, 12/04/2019   Pertinent  Health  Maintenance Due  Topic Date Due   OPHTHALMOLOGY EXAM  01/24/2018   FOOT EXAM  02/25/2021   HEMOGLOBIN A1C  07/06/2021   DEXA SCAN  Completed   INFLUENZA VACCINE  Discontinued   Fall Risk  02/24/2021 01/04/2021 11/10/2020 09/24/2020 07/06/2020  Falls in the past year? 0 0 0 0 0  Number falls in past yr: 0 0 0 0 0  Comment - - - - -  Injury with Fall? 0 0 0 0 0  Risk Factor Category  - - - - -  Risk for fall due to : No Fall Risks No Fall Risks No Fall Risks - -  Follow up Falls evaluation completed Falls evaluation completed Falls evaluation completed - -   Functional Status Survey:    Vitals:   02/24/21 1306  BP: 130/80  Pulse: 70  Temp: (!) 97.5 F (36.4 C)  SpO2: 99%   There is no height or weight on file to calculate BMI. Physical Exam Vitals reviewed.  Constitutional:      General: She is not in acute distress.    Appearance: Normal  appearance. She is normal weight. She is not ill-appearing or diaphoretic.  HENT:     Head: Normocephalic.     Right Ear: Tympanic membrane, ear canal and external ear normal. There is no impacted cerumen.     Left Ear: Tympanic membrane, ear canal and external ear normal. There is no impacted cerumen.     Nose: Nose normal. No congestion or rhinorrhea.     Mouth/Throat:     Mouth: Mucous membranes are moist.     Pharynx: Oropharynx is clear. No oropharyngeal exudate or posterior oropharyngeal erythema.  Eyes:     General: No scleral icterus.       Right eye: No discharge.        Left eye: No discharge.     Extraocular Movements: Extraocular movements intact.     Conjunctiva/sclera: Conjunctivae normal.     Pupils: Pupils are equal, round, and reactive to light.  Cardiovascular:     Rate and Rhythm: Normal rate and regular rhythm.     Pulses: Normal pulses.     Heart sounds: Normal heart sounds. No murmur heard.   No friction rub. No gallop.  Pulmonary:     Effort: Pulmonary effort is normal. No respiratory distress.     Breath  sounds: Normal breath sounds. No wheezing, rhonchi or rales.  Chest:     Chest wall: No tenderness.  Abdominal:     General: Bowel sounds are normal. There is no distension.     Palpations: Abdomen is soft. There is no mass.     Tenderness: There is no abdominal tenderness. There is no right CVA tenderness, left CVA tenderness, guarding or rebound.  Musculoskeletal:        General: No swelling or tenderness. Normal range of motion.     Cervical back: Normal range of motion. No rigidity or tenderness.     Right lower leg: No edema.     Left lower leg: No edema.     Comments: On wheelchair during visit   Lymphadenopathy:     Cervical: No cervical adenopathy.  Skin:    General: Skin is warm and dry.     Coloration: Skin is not pale.     Findings: No bruising, erythema, lesion or rash.     Comments: Right side of the face below lower eyelid small pea size raised papule with small amount of serosanguinous drainage on old bandage.slight tender to touch.wound bed red-pink in color.surrounding erythema along right eyelid sac. Area cleanse with saline,pat dry small amount of triple antibiotic ointment applied and covered with small band aid for protection.  Neurological:     Mental Status: She is alert and oriented to person, place, and time. Mental status is at baseline.     Motor: No weakness.     Gait: Gait abnormal.  Psychiatric:        Mood and Affect: Mood normal.        Speech: Speech normal.        Behavior: Behavior normal.        Thought Content: Thought content normal.        Judgment: Judgment normal.    Labs reviewed: Recent Labs    07/06/20 1632 09/30/20 1819 01/04/21 1139  NA 134* 133* 139  K 4.8 4.4 4.7  CL 99 98 101  CO2 22 24 29   GLUCOSE 127 173* 191*  BUN 22 20 29*  CREATININE 0.54* 0.47 0.56*  CALCIUM 8.9 9.0 9.7   Recent Labs  07/06/20 1632 09/30/20 1819 01/04/21 1139  AST 12 16 13   ALT 9 13 8   ALKPHOS  --  58  --   BILITOT 0.3 0.4 0.3  PROT 6.3  6.8 6.7  ALBUMIN  --  2.9*  --    Recent Labs    09/30/20 1819 10/16/20 1457 01/04/21 1139  WBC 9.3 8.1 3.8  NEUTROABS 7.2 5.8 1,661  HGB 9.6* 8.4* 9.2*  HCT 31.6* 27.1* 31.3*  MCV 92.7 93.1 86.0  PLT 331 PLATELET CLUMPS NOTED ON SMEAR, UNABLE TO ESTIMATE 283   Lab Results  Component Value Date   TSH 2.756 05/04/2019   Lab Results  Component Value Date   HGBA1C 7.0 (H) 01/04/2021   Lab Results  Component Value Date   CHOL 151 01/04/2021   HDL 43 (L) 01/04/2021   LDLCALC 90 01/04/2021   TRIG 88 01/04/2021   CHOLHDL 3.5 01/04/2021    Significant Diagnostic Results in last 30 days:  No results found.  Assessment/Plan  Cellulitis of face Afebrile  Right side of the face below lower eyelid small pea size raised papule with small amount of serosanguinous drainage on old bandage.slight tender to touch.wound bed red-pink in color.surrounding erythema along right eyelid sac. Area cleanse with saline,pat dry small amount of triple antibiotic ointment applied and covered with small band aid for protection. Daughter given instruction to cleanse and change bandage daily and notify provider if symptoms worsen or fail to resolve.  - doxycycline (VIBRA-TABS) 100 MG tablet; Take 1 tablet (100 mg total) by mouth 2 (two) times daily for 7 days.  Dispense: 14 tablet; Refill: 0  Family/ staff Communication: Reviewed plan of care with patient and daughter verbalized understanding.  Labs/tests ordered: None   Next Appointment: As needed if symptoms worsen or fail to improve    Carolyn Hughs, NP

## 2021-02-26 DIAGNOSIS — Z86711 Personal history of pulmonary embolism: Secondary | ICD-10-CM | POA: Diagnosis not present

## 2021-02-26 DIAGNOSIS — E119 Type 2 diabetes mellitus without complications: Secondary | ICD-10-CM | POA: Diagnosis not present

## 2021-02-26 DIAGNOSIS — E785 Hyperlipidemia, unspecified: Secondary | ICD-10-CM | POA: Diagnosis not present

## 2021-02-26 DIAGNOSIS — Z86718 Personal history of other venous thrombosis and embolism: Secondary | ICD-10-CM | POA: Diagnosis not present

## 2021-02-26 DIAGNOSIS — I890 Lymphedema, not elsewhere classified: Secondary | ICD-10-CM | POA: Diagnosis not present

## 2021-02-26 DIAGNOSIS — L89893 Pressure ulcer of other site, stage 3: Secondary | ICD-10-CM | POA: Diagnosis not present

## 2021-02-26 DIAGNOSIS — Z8673 Personal history of transient ischemic attack (TIA), and cerebral infarction without residual deficits: Secondary | ICD-10-CM | POA: Diagnosis not present

## 2021-02-26 DIAGNOSIS — Z7984 Long term (current) use of oral hypoglycemic drugs: Secondary | ICD-10-CM | POA: Diagnosis not present

## 2021-02-26 DIAGNOSIS — F0390 Unspecified dementia without behavioral disturbance: Secondary | ICD-10-CM | POA: Diagnosis not present

## 2021-02-26 DIAGNOSIS — Z741 Need for assistance with personal care: Secondary | ICD-10-CM | POA: Diagnosis not present

## 2021-02-26 DIAGNOSIS — Z7901 Long term (current) use of anticoagulants: Secondary | ICD-10-CM | POA: Diagnosis not present

## 2021-02-26 DIAGNOSIS — I10 Essential (primary) hypertension: Secondary | ICD-10-CM | POA: Diagnosis not present

## 2021-02-26 DIAGNOSIS — K5900 Constipation, unspecified: Secondary | ICD-10-CM | POA: Diagnosis not present

## 2021-02-26 DIAGNOSIS — M6281 Muscle weakness (generalized): Secondary | ICD-10-CM | POA: Diagnosis not present

## 2021-02-26 DIAGNOSIS — L89320 Pressure ulcer of left buttock, unstageable: Secondary | ICD-10-CM | POA: Diagnosis not present

## 2021-02-26 DIAGNOSIS — G609 Hereditary and idiopathic neuropathy, unspecified: Secondary | ICD-10-CM | POA: Diagnosis not present

## 2021-03-02 DIAGNOSIS — L89320 Pressure ulcer of left buttock, unstageable: Secondary | ICD-10-CM | POA: Diagnosis not present

## 2021-03-02 DIAGNOSIS — E119 Type 2 diabetes mellitus without complications: Secondary | ICD-10-CM | POA: Diagnosis not present

## 2021-03-02 DIAGNOSIS — I890 Lymphedema, not elsewhere classified: Secondary | ICD-10-CM | POA: Diagnosis not present

## 2021-03-02 DIAGNOSIS — E785 Hyperlipidemia, unspecified: Secondary | ICD-10-CM | POA: Diagnosis not present

## 2021-03-02 DIAGNOSIS — F0390 Unspecified dementia without behavioral disturbance: Secondary | ICD-10-CM | POA: Diagnosis not present

## 2021-03-02 DIAGNOSIS — L89893 Pressure ulcer of other site, stage 3: Secondary | ICD-10-CM | POA: Diagnosis not present

## 2021-03-09 ENCOUNTER — Encounter: Payer: Self-pay | Admitting: Family

## 2021-03-09 ENCOUNTER — Other Ambulatory Visit: Payer: Self-pay

## 2021-03-09 ENCOUNTER — Telehealth (INDEPENDENT_AMBULATORY_CARE_PROVIDER_SITE_OTHER): Payer: Medicare Other | Admitting: Nurse Practitioner

## 2021-03-09 DIAGNOSIS — R5383 Other fatigue: Secondary | ICD-10-CM | POA: Diagnosis not present

## 2021-03-09 NOTE — Progress Notes (Signed)
Careteam: Patient Care Team: [name], NP as PCP - General (Geriatric Medicine) [name], MD as Consulting Physician (Oncology) [name], [name], LCSW as Social Worker (Licensed Clinical Social Worker) [name], RN as Registered Nurse [facility] [place])  Advanced Directive information    No Known Allergies  Chief Complaint  Patient presents with   Acute Visit    Low grade fever and lethargic.COVID test negative today. Patient has been having these symptoms since about Sunday. Patient's temp was  98.3 early. This morning temp. Was almost 81. Patient is drinking and eating. Urine is a little strong in odor.     HPI: Patient is a 85 y.o. female via virtual visit due to increase fatigue and low grade fever. Generally temperature 97.5 and when she took temperature it was 98.8 (at the highest)  She was slower to eat lunch and dinner.  The aid that was here today reported she was eating slower 4 days ago.  Did not notice or mention she felt warm.  Reports stronger smelling urine.  She is drinking well at this time and pushing protein shakes daily.   Daughter reports she has had increase in nasal congestion. Denies any significant rhinorrhea. More lethargy.  No cough or chest congestion noted.   She has home health nursing monitoring her wound. Should be there tomorrow.  No signs of infections in regards to her wound.        Review of Systems:  Review of Systems  Unable to perform ROS: Dementia   Past Medical History:  Diagnosis Date   Dementia ([place])    Diabetes mellitus type II 03/21/2011   DVT (deep venous thrombosis) ([place]) 03/21/2011   Hyperlipidemia 03/21/2011   Hypertension 03/21/2011   Lymphedema of leg 03/21/2011   Pulmonary embolism ([place]) 03/21/2011   Stroke [facility])    Past Surgical History:  Procedure Laterality Date   CHOLECYSTECTOMY  2015   SKIN TAG REMOVAL  07/25/2016   VAGINAL HYSTERECTOMY     unknown of date    VASCULAR SURGERY     Social History:   reports that she has never smoked. She has never used smokeless tobacco. She reports that she does not drink alcohol and does not use drugs.  Family History  Problem Relation Age of Onset   Heart attack Mother    COPD Sister    Stroke Sister    Bipolar disorder Daughter     Medications: Patient's Medications  New Prescriptions   No medications on file  Previous Medications   A&D OINT    Apply 1 application topically as needed.    ACCU-CHEK SOFTCLIX LANCETS LANCETS    1 each by Other route 2 (two) times daily. Use as instructed Dx: E11.9   ACETAMINOPHEN (TYLENOL) 500 MG TABLET    Take 500 mg by mouth every 8 (eight) hours as needed (for general pain).    ATORVASTATIN (LIPITOR) 40 MG TABLET    Take 1 tablet (40 mg total) by mouth daily at 6 PM.   CHOLECALCIFEROL (VITAMIN D-3) 125 MCG (5000 UT) TABS    Take 5,000 Units by mouth daily.   CLONIDINE (CATAPRES) 0.1 MG TABLET    Take 1 tablet (0.1 mg total) by mouth 2 (two) times daily. May take an additional tablet if needed SBP >170   GABAPENTIN (NEURONTIN) 300 MG CAPSULE    Take 1 capsule (300 mg total) by mouth 2 (two) times daily.   GLUCOSE BLOOD (ACCU-CHEK AVIVA  PLUS) TEST STRIP    USE STRIP(S) TO TEST TWICE A DAY - (KEEP UNUSED STRIPS IN ORIGINAL SEALED CONTAINER BETWEEN USES)   LOSARTAN (COZAAR) 25 MG TABLET    Take one tablet by mouth once daily, take along with 50mg  to equal 75mg    LOSARTAN (COZAAR) 50 MG TABLET    Take one tablet by mouth once daily, along with 25mg  to equal 75mg    METFORMIN (GLUCOPHAGE) 500 MG TABLET    TAKE ONE TABLET BY MOUTH TWICE A DAY WITH MEALS   NON FORMULARY    Take 1 capsule by mouth See admin instructions. Metagenics - UltraFlora Balance Daily Probiotic Immune Support* capsules- Take 1 capsule by mouth once a day   POLYETHYLENE GLYCOL (MIRALAX / GLYCOLAX) PACKET    Take 17 g by mouth daily as needed for mild constipation (MIX AND DRINK).    RIVAROXABAN (XARELTO) 15  MG TABS TABLET    Take one tablet by mouth once daily with supper   ZINC GLUCONATE 50 MG TABLET    Take 50 mg by mouth daily.  Modified Medications   No medications on file  Discontinued Medications   No medications on file    Physical Exam:  There were no vitals filed for this visit. There is no height or weight on file to calculate BMI. Wt Readings from Last 3 Encounters:  05/04/19 130 lb 1.1 oz (59 kg)  11/02/18 130 lb (59 kg)  01/30/18 130 lb (59 kg)      Labs reviewed: Basic Metabolic Panel: Recent Labs    07/06/20 1632 09/30/20 1819 01/04/21 1139  NA 134* 133* 139  K 4.8 4.4 4.7  CL 99 98 101  CO2 22 24 29   GLUCOSE 127 173* 191*  BUN 22 20 29*  CREATININE 0.54* 0.47 0.56*  CALCIUM 8.9 9.0 9.7   Liver Function Tests: Recent Labs    07/06/20 1632 09/30/20 1819 01/04/21 1139  AST 12 16 13   ALT 9 13 8   ALKPHOS  --  58  --   BILITOT 0.3 0.4 0.3  PROT 6.3 6.8 6.7  ALBUMIN  --  2.9*  --    No results for input(s): LIPASE, AMYLASE in the last 8760 hours. No results for input(s): AMMONIA in the last 8760 hours. CBC: Recent Labs    09/30/20 1819 10/16/20 1457 01/04/21 1139  WBC 9.3 8.1 3.8  NEUTROABS 7.2 5.8 1,661  HGB 9.6* 8.4* 9.2*  HCT 31.6* 27.1* 31.3*  MCV 92.7 93.1 86.0  PLT 331 PLATELET CLUMPS NOTED ON SMEAR, UNABLE TO ESTIMATE 283   Lipid Panel: Recent Labs    01/04/21 1139  CHOL 151  HDL 43*  LDLCALC 90  TRIG 88  CHOLHDL 3.5   TSH: No results for input(s): TSH in the last 8760 hours. A1C: Lab Results  Component Value Date   HGBA1C 7.0 (H) 01/04/2021     Assessment/Plan 1. Lethargy -COVID negative. Likely another virus due to some mild nasal congestion.  Supportive care at this time Increase fluid intake. Monitor for elevated temperature/fever.  -home health nursing is following for wound care and will likely be at the home tomorrow to do a full assessment. [name] will let us know if any other symptoms develop or if  she needs in office visit.    Carlos American. Harle Battiest  [facility] Adult Medicine [phone]    Virtual Visit via video, my chart visit   I connected with patient on 03/09/21 at  3:15 PM EDT by video  and verified that I am speaking with the correct person using two identifiers.  Location: Patient: home Provider: [facility]    I discussed the limitations, risks, security and privacy concerns of performing an evaluation and management service by telephone and the availability of in person appointments. I also discussed with the patient that there may be a patient responsible charge related to this service. The patient expressed understanding and agreed to proceed.   I discussed the assessment and treatment plan with the patient. The patient was provided an opportunity to ask questions and all were answered. The patient agreed with the plan and demonstrated an understanding of the instructions.   The patient was advised to call back or seek an in-person evaluation if the symptoms worsen or if the condition fails to improve as anticipated.  I provided 15 minutes of non-face-to-face time during this encounter.  Carlos American. Harle Battiest Avs printed and mailed

## 2021-03-09 NOTE — Progress Notes (Signed)
This service is provided via telemedicine  No vital signs collected/recorded due to the encounter was a telemedicine visit.   Location of patient (ex: home, work):  Home  Patient consents to a telephone visit:  es, see encounter dated 11/10/2020  Location of the provider (ex: office, home):  [place]  Name of any referring provider:  N/A  Names of all persons participating in the telemedicine service and their role in the encounter:  [name], Nurse Practitioner, Carroll Kinds, CMA, and patient.   Time spent on call:  7 minutes with medical assistant

## 2021-03-11 DIAGNOSIS — F0390 Unspecified dementia without behavioral disturbance: Secondary | ICD-10-CM | POA: Diagnosis not present

## 2021-03-11 DIAGNOSIS — L89893 Pressure ulcer of other site, stage 3: Secondary | ICD-10-CM | POA: Diagnosis not present

## 2021-03-11 DIAGNOSIS — I890 Lymphedema, not elsewhere classified: Secondary | ICD-10-CM | POA: Diagnosis not present

## 2021-03-11 DIAGNOSIS — E785 Hyperlipidemia, unspecified: Secondary | ICD-10-CM | POA: Diagnosis not present

## 2021-03-11 DIAGNOSIS — L89320 Pressure ulcer of left buttock, unstageable: Secondary | ICD-10-CM | POA: Diagnosis not present

## 2021-03-11 DIAGNOSIS — E119 Type 2 diabetes mellitus without complications: Secondary | ICD-10-CM | POA: Diagnosis not present

## 2021-03-16 DIAGNOSIS — E785 Hyperlipidemia, unspecified: Secondary | ICD-10-CM | POA: Diagnosis not present

## 2021-03-16 DIAGNOSIS — F0390 Unspecified dementia without behavioral disturbance: Secondary | ICD-10-CM | POA: Diagnosis not present

## 2021-03-16 DIAGNOSIS — E119 Type 2 diabetes mellitus without complications: Secondary | ICD-10-CM | POA: Diagnosis not present

## 2021-03-16 DIAGNOSIS — I890 Lymphedema, not elsewhere classified: Secondary | ICD-10-CM | POA: Diagnosis not present

## 2021-03-16 DIAGNOSIS — L89893 Pressure ulcer of other site, stage 3: Secondary | ICD-10-CM | POA: Diagnosis not present

## 2021-03-16 DIAGNOSIS — L89320 Pressure ulcer of left buttock, unstageable: Secondary | ICD-10-CM | POA: Diagnosis not present

## 2021-03-17 ENCOUNTER — Other Ambulatory Visit: Payer: Self-pay

## 2021-03-17 ENCOUNTER — Other Ambulatory Visit: Payer: Medicare Other | Admitting: *Deleted

## 2021-03-17 ENCOUNTER — Other Ambulatory Visit: Payer: Medicare Other

## 2021-03-17 DIAGNOSIS — Z515 Encounter for palliative care: Secondary | ICD-10-CM

## 2021-03-17 NOTE — Progress Notes (Signed)
COMMUNITY PALLIATIVE CARE SW NOTE  PATIENT NAME: [name] DOB: 1935-07-12 MRN: [id number]  PRIMARY CARE PROVIDER: [name], NP  RESPONSIBLE PARTY:  Acct ID - Guarantor Home Phone Work Phone Relationship Acct Type  [id number] [name]  Self P/F     [address], [place], [place] [address]   Due to the COVID-19 crisis, this virtual check-in visit was done via telephone from my office and it was initiated and consent by this patient's PCG.    PLAN OF CARE and INTERVENTIONS:             GOALS OF CARE/ ADVANCE CARE PLANNING:  Goal is for patient to remain at home with her family. Patient is a DNR. SOCIAL/EMOTIONAL/SPIRITUAL ASSESSMENT/ INTERVENTIONS:  SW and RN-M. Nadara Mustard completed a joint telephonic check-in visit with patient's daughter-in-law/PCG-[name], who provided a status update on patient's care. Patient remains dependent for all ADL's. Patient is going to wound care clinic every six weeks which is down from every 2 weeks previously. Patient also continues to receive home-health. [name] advises that patient's sacrum wound has had little overall improvement, but it has closed up. Patient's appetite remain good, but she appears thinner according to [facility]. Protein shakes (1-2/day ) have been added to her diet. Her blood pressures have been normal, with occasional elevation. Her blood sugars have been running low, but they have been stable. Patient's blood pressures, if they are elevated, it will be in the mornings. Patient is receiving physical therapy to work on her upper body strength through a private physical therapist. [name] advised that she desires to take a trip with her daughter this coming July and is considering care for patient as she would like to keep patient home with in-home caregivers. SW discussed with her the pros and cons of in-home care with friends and current caregivers verses considering respite care at at [facility]. [name] advised that she  will consider all options. SW discussed and encouraged self-care for herself. SW stressed the importance of self care. [name] remains open to ongoing supportive telehealth visits with the team.  PATIENT/CAREGIVER EDUCATION/ COPING:  PCG appears to be coping well.  PERSONAL EMERGENCY PLAN:  911 can be activated for emergencies. COMMUNITY RESOURCES COORDINATION/ HEALTH CARE NAVIGATION:  Patient has private caregivers daily. She attends the wound clinic and receives private physical therapy.  FINANCIAL/LEGAL CONCERNS/INTERVENTIONS:  None.     SOCIAL HX:  Social History   Tobacco Use   Smoking status: Never   Smokeless tobacco: Never  Substance Use Topics   Alcohol use: No    CODE STATUS: DNR ADVANCED DIRECTIVES: Np MOST FORM COMPLETE:  Yes HOSPICE EDUCATION PROVIDED: No  PPS: Patient continues to rely on family and paid caregivers for all ADL's.      Duration of telephonic visit and documentation: [address], LCSW

## 2021-03-25 DIAGNOSIS — E785 Hyperlipidemia, unspecified: Secondary | ICD-10-CM | POA: Diagnosis not present

## 2021-03-25 DIAGNOSIS — E119 Type 2 diabetes mellitus without complications: Secondary | ICD-10-CM | POA: Diagnosis not present

## 2021-03-25 DIAGNOSIS — F0390 Unspecified dementia without behavioral disturbance: Secondary | ICD-10-CM | POA: Diagnosis not present

## 2021-03-25 DIAGNOSIS — I890 Lymphedema, not elsewhere classified: Secondary | ICD-10-CM | POA: Diagnosis not present

## 2021-03-25 DIAGNOSIS — L89320 Pressure ulcer of left buttock, unstageable: Secondary | ICD-10-CM | POA: Diagnosis not present

## 2021-03-25 DIAGNOSIS — L89893 Pressure ulcer of other site, stage 3: Secondary | ICD-10-CM | POA: Diagnosis not present

## 2021-03-28 DIAGNOSIS — Z86711 Personal history of pulmonary embolism: Secondary | ICD-10-CM | POA: Diagnosis not present

## 2021-03-28 DIAGNOSIS — G609 Hereditary and idiopathic neuropathy, unspecified: Secondary | ICD-10-CM | POA: Diagnosis not present

## 2021-03-28 DIAGNOSIS — K5900 Constipation, unspecified: Secondary | ICD-10-CM | POA: Diagnosis not present

## 2021-03-28 DIAGNOSIS — E119 Type 2 diabetes mellitus without complications: Secondary | ICD-10-CM

## 2021-03-28 DIAGNOSIS — M6281 Muscle weakness (generalized): Secondary | ICD-10-CM | POA: Diagnosis not present

## 2021-03-28 DIAGNOSIS — F0390 Unspecified dementia without behavioral disturbance: Secondary | ICD-10-CM

## 2021-03-28 DIAGNOSIS — E785 Hyperlipidemia, unspecified: Secondary | ICD-10-CM

## 2021-03-28 DIAGNOSIS — L89893 Pressure ulcer of other site, stage 3: Secondary | ICD-10-CM

## 2021-03-28 DIAGNOSIS — I890 Lymphedema, not elsewhere classified: Secondary | ICD-10-CM

## 2021-03-28 DIAGNOSIS — L89320 Pressure ulcer of left buttock, unstageable: Secondary | ICD-10-CM

## 2021-03-28 DIAGNOSIS — Z7901 Long term (current) use of anticoagulants: Secondary | ICD-10-CM | POA: Diagnosis not present

## 2021-03-28 DIAGNOSIS — Z8673 Personal history of transient ischemic attack (TIA), and cerebral infarction without residual deficits: Secondary | ICD-10-CM | POA: Diagnosis not present

## 2021-03-28 DIAGNOSIS — Z86718 Personal history of other venous thrombosis and embolism: Secondary | ICD-10-CM | POA: Diagnosis not present

## 2021-03-28 DIAGNOSIS — I10 Essential (primary) hypertension: Secondary | ICD-10-CM

## 2021-03-28 DIAGNOSIS — Z741 Need for assistance with personal care: Secondary | ICD-10-CM | POA: Diagnosis not present

## 2021-03-28 DIAGNOSIS — Z7984 Long term (current) use of oral hypoglycemic drugs: Secondary | ICD-10-CM | POA: Diagnosis not present

## 2021-03-30 DIAGNOSIS — I890 Lymphedema, not elsewhere classified: Secondary | ICD-10-CM | POA: Diagnosis not present

## 2021-03-30 DIAGNOSIS — L89320 Pressure ulcer of left buttock, unstageable: Secondary | ICD-10-CM | POA: Diagnosis not present

## 2021-03-30 DIAGNOSIS — E119 Type 2 diabetes mellitus without complications: Secondary | ICD-10-CM | POA: Diagnosis not present

## 2021-03-30 DIAGNOSIS — F0390 Unspecified dementia without behavioral disturbance: Secondary | ICD-10-CM | POA: Diagnosis not present

## 2021-03-30 DIAGNOSIS — E785 Hyperlipidemia, unspecified: Secondary | ICD-10-CM | POA: Diagnosis not present

## 2021-03-30 DIAGNOSIS — L89893 Pressure ulcer of other site, stage 3: Secondary | ICD-10-CM | POA: Diagnosis not present

## 2021-04-02 ENCOUNTER — Other Ambulatory Visit: Payer: Self-pay | Admitting: Nurse Practitioner

## 2021-04-02 MED ORDER — METFORMIN HCL 500 MG PO TABS: 500.0000 mg | ORAL_TABLET | Freq: Two times a day (BID) | ORAL | 1 refills | Status: DC

## 2021-04-02 MED ORDER — METFORMIN HCL 500 MG PO TABS: 500.0000 mg | ORAL_TABLET | Freq: Two times a day (BID) | ORAL | 3 refills | Status: DC

## 2021-04-07 ENCOUNTER — Encounter (HOSPITAL_BASED_OUTPATIENT_CLINIC_OR_DEPARTMENT_OTHER): Payer: Medicare Other | Attending: Internal Medicine | Admitting: Internal Medicine

## 2021-04-07 ENCOUNTER — Other Ambulatory Visit: Payer: Self-pay

## 2021-04-07 DIAGNOSIS — I10 Essential (primary) hypertension: Secondary | ICD-10-CM | POA: Diagnosis not present

## 2021-04-07 DIAGNOSIS — F0390 Unspecified dementia without behavioral disturbance: Secondary | ICD-10-CM | POA: Diagnosis not present

## 2021-04-07 DIAGNOSIS — L89324 Pressure ulcer of left buttock, stage 4: Secondary | ICD-10-CM | POA: Diagnosis not present

## 2021-04-07 DIAGNOSIS — L89314 Pressure ulcer of right buttock, stage 4: Secondary | ICD-10-CM | POA: Diagnosis not present

## 2021-04-07 DIAGNOSIS — L89893 Pressure ulcer of other site, stage 3: Secondary | ICD-10-CM | POA: Diagnosis not present

## 2021-04-07 DIAGNOSIS — E119 Type 2 diabetes mellitus without complications: Secondary | ICD-10-CM | POA: Diagnosis not present

## 2021-04-07 DIAGNOSIS — Z7984 Long term (current) use of oral hypoglycemic drugs: Secondary | ICD-10-CM | POA: Insufficient documentation

## 2021-04-07 NOTE — Progress Notes (Signed)
[name] ([id number]) , Visit Report for 04/07/2021 Arrival Information Details Patient Name: Date of Service: MYLO, DRISKILL 04/07/2021 9:30 A M Medical Record Number: [id number] Patient Account Number: [id number] Date of Birth/Sex: Treating RN: [birth year] (85 y.o. [name], [name] Primary Care Malorie Bigford: [name] Other Clinician: Referring Lyan Moyano: Treating Atharv Barriere/Extender: Deloria Lair in Treatment: 26 Visit Information History Since Last Visit Added or deleted any medications: No Patient Arrived: Wheel Chair Any new allergies or adverse reactions: No Arrival Time: 10:13 Had a fall or experienced change in No Accompanied By: daughter activities of daily living that may affect Transfer Assistance: Manual risk of falls: Patient Identification Verified: Yes Signs or symptoms of abuse/neglect since last visito No Patient Requires Transmission-Based Precautions: No Hospitalized since last visit: No Patient Has Alerts: No Implantable device outside of the clinic excluding No cellular tissue based products placed in the center since last visit: Pain Present Now: No Electronic Signature(s) Signed: 04/07/2021 5:48:07 PM By: Dellie Catholic RN Entered By: Dellie Catholic on 04/07/2021 10:14:38 -------------------------------------------------------------------------------- Clinic Level of Care Assessment Details Patient Name: Date of Service: ORA, BOLLIG 04/07/2021 9:30 A M Medical Record Number: [id number] Patient Account Number: [id number] Date of Birth/Sex: Treating RN: 07-07-1935 (85 y.o. [name] Primary Care Aitana Burry: [name] Other Clinician: Referring Leighton Brickley: Treating Junice Fei/Extender: Deloria Lair in Treatment: 26 Clinic Level of Care Assessment Items TOOL 4 Quantity Score X- 1 0 Use when only an EandM is performed on FOLLOW-UP visit ASSESSMENTS - Nursing Assessment /  Reassessment X- 1 10 Reassessment of Co-morbidities (includes updates in patient status) X- 1 5 Reassessment of Adherence to Treatment Plan ASSESSMENTS - Wound and Skin A ssessment / Reassessment [] - 0 Simple Wound Assessment / Reassessment - one wound X- 2 5 Complex Wound Assessment / Reassessment - multiple wounds [] - 0 Dermatologic / Skin Assessment (not related to wound area) ASSESSMENTS - Focused Assessment [] - 0 Circumferential Edema Measurements - multi extremities [] - 0 Nutritional Assessment / Counseling / Intervention [] - 0 Lower Extremity Assessment (monofilament, tuning fork, pulses) [] - 0 Peripheral Arterial Disease Assessment (using hand held doppler) ASSESSMENTS - Ostomy and/or Continence Assessment and Care [] - 0 Incontinence Assessment and Management [] - 0 Ostomy Care Assessment and Management (repouching, etc.) PROCESS - Coordination of Care X - Simple Patient / Family Education for ongoing care 1 15 [] - 0 Complex (extensive) Patient / Family Education for ongoing care X- 1 10 Staff obtains Programmer, systems, Records, T Results / Process Orders est X- 1 10 Staff telephones HHA, Nursing Homes / Clarify orders / etc [] - 0 Routine Transfer to another Facility (non-emergent condition) [] - 0 Routine Hospital Admission (non-emergent condition) [] - 0 New Admissions / Biomedical engineer / Ordering NPWT Apligraf, etc. , [] - 0 Emergency Hospital Admission (emergent condition) X- 1 10 Simple Discharge Coordination [] - 0 Complex (extensive) Discharge Coordination PROCESS - Special Needs [] - 0 Pediatric / Minor Patient Management [] - 0 Isolation Patient Management [] - 0 Hearing / Language / Visual special needs [] - 0 Assessment of Community assistance (transportation, D/C planning, etc.) [] - 0 Additional assistance / Altered mentation [] - 0 Support Surface(s) Assessment (bed, cushion, seat, etc.) INTERVENTIONS - Wound Cleansing /  Measurement [] - 0 Simple Wound Cleansing - one wound X- 2 5 Complex Wound Cleansing - multiple wounds X- 1 5 Wound Imaging (photographs - any number of wounds) [] - 0  Wound Tracing (instead of photographs) [] - 0 Simple Wound Measurement - one wound X- 2 5 Complex Wound Measurement - multiple wounds INTERVENTIONS - Wound Dressings X - Small Wound Dressing one or multiple wounds 1 10 X- 1 15 Medium Wound Dressing one or multiple wounds [] - 0 Large Wound Dressing one or multiple wounds [] - 0 Application of Medications - topical [] - 0 Application of Medications - injection INTERVENTIONS - Miscellaneous [] - 0 External ear exam [] - 0 Specimen Collection (cultures, biopsies, blood, body fluids, etc.) [] - 0 Specimen(s) / Culture(s) sent or taken to Lab for analysis [] - 0 Patient Transfer (multiple staff / Civil Service fast streamer / Similar devices) [] - 0 Simple Staple / Suture removal (25 or less) [] - 0 Complex Staple / Suture removal (26 or more) [] - 0 Hypo / Hyperglycemic Management (close monitor of Blood Glucose) [] - 0 Ankle / Brachial Index (ABI) - do not check if billed separately X- 1 5 Vital Signs Has the patient been seen at the hospital within the last three years: Yes Total Score: 125 Level Of Care: New/Established - Level 4 Electronic Signature(s) Signed: 04/07/2021 5:07:48 PM By: Levan Hurst RN, BSN Entered By: Levan Hurst on 04/07/2021 11:29:40 -------------------------------------------------------------------------------- Encounter Discharge Information Details Patient Name: Date of Service: [name] 04/07/2021 9:30 A M Medical Record Number: [id number] Patient Account Number: [id number] Date of Birth/Sex: Treating RN: 07/31/1935 (85 y.o. [name] Primary Care : [name] Other Clinician: Referring : Treating /Extender: Deloria Lair in Treatment: 26 Encounter Discharge  Information Items Discharge Condition: Stable Ambulatory Status: Ambulatory Discharge Destination: Home Transportation: Private Auto Accompanied By: caregiver Schedule Follow-up Appointment: Yes Clinical Summary of Care: Patient Declined Electronic Signature(s) Signed: 04/07/2021 5:07:48 PM By: Levan Hurst RN, BSN Entered By: Levan Hurst on 04/07/2021 11:35:15 -------------------------------------------------------------------------------- Lower Extremity Assessment Details Patient Name: Date of Service: NECIE, WILCOXSON 04/07/2021 9:30 A M Medical Record Number: [id number] Patient Account Number: [id number] Date of Birth/Sex: Treating RN: [birth year] (85 y.o. [name] Primary Care : [name] Other Clinician: Referring : Treating /Extender: Deloria Lair in Treatment: 26 Electronic Signature(s) Signed: 04/07/2021 5:07:48 PM By: Levan Hurst RN, BSN Signed: 04/07/2021 5:48:07 PM By: Dellie Catholic RN Entered By: Dellie Catholic on 04/07/2021 10:27:09 -------------------------------------------------------------------------------- Multi Wound Chart Details Patient Name: Date of Service: GEANETTE, [place] 04/07/2021 9:30 A M Medical Record Number: [id number] Patient Account Number: [id number] Date of Birth/Sex: Treating RN: 11-May-1935 (85 y.o. [name] Primary Care : [name] Other Clinician: Referring : Treating /Extender: Deloria Lair in Treatment: 26 Vital Signs Height(in): 68 Pulse(bpm): 56 Weight(lbs): 110 Blood Pressure(mmHg): 149/88 Body Mass Index(BMI): 17 Temperature(F): 97.5 Respiratory Rate(breaths/min): 16 Photos: [N/A:N/A] Left Gluteus Right, Lateral Lower Leg N/A Wound Location: Pressure Injury Pressure Injury N/A Wounding Event: Pressure Ulcer Pressure Ulcer N/A Primary Etiology: Anemia, Deep Vein Thrombosis, Anemia,  Deep Vein Thrombosis, N/A Comorbid History: Hypertension, Type II Diabetes, Hypertension, Type II Diabetes, Dementia Dementia 09/18/2020 12/16/2020 N/A Date Acquired: 26 15 N/A Weeks of Treatment: Open Open N/A Wound Status: 5x4.1x2.9 0.2x0.2x0.1 N/A Measurements L x W x D (cm) 16.101 0.031 N/A A (cm) : rea 46.692 0.003 N/A Volume (cm) : 61.30% 98.50% N/A % Reduction in A rea: -1020.80% 99.50% N/A % Reduction in Volume: 12 Starting Position 1 (o'clock): 6 Ending Position 1 (o'clock): 3.7 Maximum Distance 1 (cm): Yes No N/A Undermining: Category/Stage III Category/Stage III N/A  Classification: Medium Medium N/A Exudate A mount: Serosanguineous Serosanguineous N/A Exudate Type: red, brown red, brown N/A Exudate Color: Epibole Well defined, not attached N/A Wound Margin: Large (67-100%) Large (67-100%) N/A Granulation A mount: Red, Pink Red, Pink N/A Granulation Quality: Small (1-33%) Small (1-33%) N/A Necrotic A mount: Fat Layer (Subcutaneous Tissue): Yes Fat Layer (Subcutaneous Tissue): Yes N/A Exposed Structures: Fascia: No Fascia: No Tendon: No Tendon: No Muscle: No Muscle: No Joint: No Joint: No Bone: No Bone: No Medium (34-66%) Large (67-100%) N/A Epithelialization: Treatment Notes Electronic Signature(s) Signed: 04/07/2021 4:53:46 PM By: Linton Ham MD Signed: 04/07/2021 5:07:48 PM By: Levan Hurst RN, BSN Entered By: Linton Ham on 04/07/2021 11:00:30 -------------------------------------------------------------------------------- Multi-Disciplinary Care Plan Details Patient Name: Date of Service: [place] 04/07/2021 9:30 A M Medical Record Number: [id number] Patient Account Number: [id number] Date of Birth/Sex: Treating RN: February 13, 1936 (85 y.o. [name] Primary Care Daimien Patmon: Other Clinician: [name] Referring Elior Robinette: Treating Sefora Tietje/Extender: Deloria Lair in Treatment:  26 Active Inactive Wound/Skin Impairment Nursing Diagnoses: Impaired tissue integrity Goals: Patient/caregiver will verbalize understanding of skin care regimen Date Initiated: 10/02/2020 Target Resolution Date: 05/07/2021 Goal Status: Active Ulcer/skin breakdown will have a volume reduction of 30% by week 4 Date Initiated: 10/02/2020 Date Inactivated: 01/06/2021 Target Resolution Date: 01/09/2021 Goal Status: Met Interventions: Assess patient/caregiver ability to obtain necessary supplies Assess patient/caregiver ability to perform ulcer/skin care regimen upon admission and as needed Assess ulceration(s) every visit Provide education on ulcer and skin care Treatment Activities: Skin care regimen initiated : 10/02/2020 Topical wound management initiated : 10/02/2020 Notes: 11/10/20 : Goal not yet met, eschar debrided off increasing wound size. Electronic Signature(s) Signed: 04/07/2021 5:07:48 PM By: Levan Hurst RN, BSN Entered By: Levan Hurst on 04/07/2021 10:38:31 -------------------------------------------------------------------------------- Pain Assessment Details Patient Name: Date of Service: JAYELYN, BARNO 04/07/2021 9:30 A M Medical Record Number: [id number] Patient Account Number: [id number] Date of Birth/Sex: Treating RN: 05-Oct-1935 (85 y.o. [name] Primary Care Merlen Gurry: [name] Other Clinician: Referring Jacquita Mulhearn: Treating Edita Weyenberg/Extender: Deloria Lair in Treatment: 26 Active Problems Location of Pain Severity and Description of Pain Patient Has Paino No Site Locations Pain Management and Medication Current Pain Management: Electronic Signature(s) Signed: 04/07/2021 5:07:48 PM By: Levan Hurst RN, BSN Signed: 04/07/2021 5:48:07 PM By: Dellie Catholic RN Entered By: Dellie Catholic on 04/07/2021 10:15:31 -------------------------------------------------------------------------------- Patient/Caregiver  Education Details Patient Name: Date of Service: [name] 11/30/2022andnbsp9:30 A M Medical Record Number: [id number] Patient Account Number: [id number] Date of Birth/Gender: Treating RN: 1935/11/19 (85 y.o. [name] Primary Care Physician: [name] Other Clinician: Referring Physician: Treating Physician/Extender: Deloria Lair in Treatment: 26 Education Assessment Education Provided To: Patient Education Topics Provided Wound/Skin Impairment: Methods: Explain/Verbal Responses: State content correctly [employer]) Signed: 04/07/2021 5:07:48 PM By: Levan Hurst RN, BSN Entered By: Levan Hurst on 04/07/2021 10:38:53 -------------------------------------------------------------------------------- Wound Assessment Details Patient Name: Date of Service: TIERNEY, BEHL 04/07/2021 9:30 A M Medical Record Number: [id number] Patient Account Number: [id number] Date of Birth/Sex: Treating RN: 1936/05/08 (85 y.o. [name] Primary Care Lawarence Meek: [name] Other Clinician: Referring Lakera Viall: Treating Cherene Dobbins/Extender: Deloria Lair in Treatment: 26 Wound Status Wound Number: 2 Primary Pressure Ulcer Etiology: Wound Location: Left Gluteus Wound Status: Open Wounding Event: Pressure Injury Comorbid Anemia, Deep Vein Thrombosis, Hypertension, Type II Date Acquired: 09/18/2020 History: Diabetes, Dementia Weeks Of Treatment: 26 Clustered Wound: No Photos Wound Measurements Length: (cm) 5 Width: (cm) 4.1 Depth: (  cm) 2.9 Area: (cm) 16.101 Volume: (cm) 46.692 % Reduction in Area: 61.3% % Reduction in Volume: -1020.8% Epithelialization: Medium (34-66%) Tunneling: No Undermining: Yes Starting Position (o'clock): 12 Ending Position (o'clock): 6 Maximum Distance: (cm) 3.7 Wound Description Classification: Category/Stage III Wound Margin: Epibole Exudate Amount:  Medium Exudate Type: Serosanguineous Exudate Color: red, brown Foul Odor After Cleansing: No Slough/Fibrino Yes Wound Bed Granulation Amount: Large (67-100%) Exposed Structure Granulation Quality: Red, Pink Fascia Exposed: No Necrotic Amount: Small (1-33%) Fat Layer (Subcutaneous Tissue) Exposed: Yes Necrotic Quality: Adherent Slough Tendon Exposed: No Muscle Exposed: No Joint Exposed: No Bone Exposed: No Treatment Notes Wound #2 (Gluteus) Wound Laterality: Left Cleanser Soap and Water Discharge Instruction: May shower and wash wound with dial antibacterial soap and water prior to dressing change. Peri-Wound Care Topical Primary Dressing KerraCel Ag Gelling Fiber Dressing, 4x5 in (silver alginate) Discharge Instruction: Apply silver alginate to wound bed as instructed Secondary Dressing Woven Gauze Sponge, Non-Sterile 4x4 in Discharge Instruction: Dry gauze backing over alginate ABD Pad, 8x10 Discharge Instruction: Apply over primary dressing as directed. Secured With 40M Medipore H Soft Cloth Surgical T 4 x 2 (in/yd) ape Discharge Instruction: Secure dressing with tape as directed. Compression Wrap Compression Stockings Add-Ons Electronic Signature(s) Signed: 04/07/2021 3:47:30 PM By: Sandre Kitty Signed: 04/07/2021 5:07:48 PM By: Levan Hurst RN, BSN Entered By: Sandre Kitty on 04/07/2021 10:26:20 -------------------------------------------------------------------------------- Wound Assessment Details Patient Name: Date of Service: MARCELLE, HEPNER 04/07/2021 9:30 A M Medical Record Number: [id number] Patient Account Number: [id number] Date of Birth/Sex: Treating RN: 1936/04/02 (85 y.o. [name] Primary Care : [name] Other Clinician: Referring : Treating /Extender: Deloria Lair in Treatment: 26 Wound Status Wound Number: 3 Primary Pressure Ulcer Etiology: Wound Location: Right,  Lateral Lower Leg Wound Status: Open Wounding Event: Pressure Injury Comorbid Anemia, Deep Vein Thrombosis, Hypertension, Type II Date Acquired: 12/16/2020 History: Diabetes, Dementia Weeks Of Treatment: 15 Clustered Wound: No Photos Wound Measurements Length: (cm) 0.2 Width: (cm) 0.2 Depth: (cm) 0.1 Area: (cm) 0.031 Volume: (cm) 0.003 % Reduction in Area: 98.5% % Reduction in Volume: 99.5% Epithelialization: Large (67-100%) Tunneling: No Undermining: No Wound Description Classification: Category/Stage III Wound Margin: Well defined, not attached Exudate Amount: Medium Exudate Type: Serosanguineous Exudate Color: red, brown Foul Odor After Cleansing: No Slough/Fibrino Yes Wound Bed Granulation Amount: Large (67-100%) Exposed Structure Granulation Quality: Red, Pink Fascia Exposed: No Necrotic Amount: Small (1-33%) Fat Layer (Subcutaneous Tissue) Exposed: Yes Necrotic Quality: Adherent Slough Tendon Exposed: No Muscle Exposed: No Joint Exposed: No Bone Exposed: No Treatment Notes Wound #3 (Lower Leg) Wound Laterality: Right, Lateral Cleanser Soap and Water Discharge Instruction: May shower and wash wound with dial antibacterial soap and water prior to dressing change. Wound Cleanser Discharge Instruction: Cleanse the wound with wound cleanser prior to applying a clean dressing using gauze sponges, not tissue or cotton balls. Peri-Wound Care Topical Primary Dressing Hydrofera Blue Ready Foam, 2.5 x2.5 in Discharge Instruction: Apply to wound bed as instructed Secondary Dressing Zetuvit Plus Silicone Border Dressing 4x4 (in/in) Discharge Instruction: Apply silicone border over primary dressing as directed. Secured With Compression Wrap Compression Stockings Environmental education officer) Signed: 04/07/2021 3:47:30 PM By: Sandre Kitty Signed: 04/07/2021 5:07:48 PM By: Levan Hurst RN, BSN Entered By: Sandre Kitty on 04/07/2021  10:21:04 -------------------------------------------------------------------------------- Vitals Details Patient Name: Date of Service: [name] 04/07/2021 9:30 A M Medical Record Number: [id number] Patient Account Number: [id number] Date of Birth/Sex: Treating RN: [birth year] (85 y.o. [name] Primary Care  : [name] Other Clinician: Referring : Treating /Extender: Deloria Lair in Treatment: 26 Vital Signs Time Taken: 10:15 Temperature (F): 97.5 Height (in): 68 Pulse (bpm): 78 Weight (lbs): 110 Respiratory Rate (breaths/min): 16 Body Mass Index (BMI): 16.7 Blood Pressure (mmHg): 149/88 Reference Range: 80 - 120 mg / dl Electronic Signature(s) Signed: 04/07/2021 5:48:07 PM By: Dellie Catholic RN Entered By: Dellie Catholic on 04/07/2021 10:15:20

## 2021-04-07 NOTE — Progress Notes (Signed)
[name] ([id number]) , Visit Report for 04/07/2021 HPI Details Patient Name: Date of Service: [name], [name] 04/07/2021 9:30 A M Medical Record Number: [id number] Patient Account Number: [id number] Date of Birth/Sex: Treating RN: Jul 25, 1935 (85 y.o. [name] Primary Care Provider: [name] Other Clinician: Referring Provider: Treating Provider/Extender: Deloria Lair in Treatment: 26 History of Present Illness HPI Description: Admission 5/27 Ms. Kaylany Tesoriero is an 85 year old female with a past medical history of type 2 diabetes on oral agents, hypertension and dementia that presents today to the clinic for a wound to her right buttocks. Her daughter-in-law is present today and helps provide the history. This issue has been going on for 2 to 3 weeks now. She states she developed an eschar about 1 week ago. She has been taking doxycycline for possible soft tissue infection to the area prescribed by her primary care provider. Patient currently denies any signs of infection. 6/3; patient admitted to the clinic last week. She has a large necrotic area on her left buttock. They use Santyl last week. On arrival this visit she had completely necrotic surface with open to subcutaneous tissue. The surface was completely nonviable. They have been using Santyl. She apparently had been on doxycycline which she is just finishing prescribed by her primary doctor for possible soft tissue infection 6/7; patient with a large necrotic wound over her left buttock. Aggressive debridement last week. Culture of this grew Proteus unfortunately would not of been covered well or at least predictably by the doxycycline that her primary doctor put her on. I have therefore put her on Augmentin suspension 3 times a day. We are using silver alginate as the primary dressing while we deal with the infection. Her daughter has a list of concerns including swallowing  difficulties, concerns about aspiration. The patient has advanced dementia. If this is Alzheimer's disease it is at its preterminal stages. I went over that swallowing difficulties are part of what happens in the preterminal stages of Alzheimer's disease. Nevertheless we will family members seems to want to pursue a very aggressive course 6/21 large wound over her left buttock. She has completed the antibiotics I gave her [Augmentin]. We are using silver alginate while we are dealing with the infection and also to help with the drainage In general the wound looks better she has healthy granulation over perhaps 70% of it. Superiorly there is still necrotic debris I did not attempt to debride this today The patient has advanced dementia. I do not know that she could tolerate a wound VAC. She also has double incontinence which would make that difficult. Nevertheless she has been cared for diligently by her family. She is apparently eating well and may be 2 to 3 hours on this area all day 7/5; 2-week follow-up. She continues with a nice improvement in overall condition of the wound bed. The undermining area has still some adherent surface slough. We have been using silver alginate because of the drainage and possibility of at least superficial wound infection/colonization. Although I said in previous notes I wondered whether she could tolerate the wound VAC they have done so well with this wound at home I do not want to necessarily rule her out for a wound VAC and I am going to direct the start of a trial of wound VAC therapy if this can be covered by insurance, if we have home health support to change it at all 7/19; we did not get very far with the wound  VAC because my original ICD 10 said this was unstageable. As usual this represents a considerable frustration because we would not of ordered a wound VAC if the wound was still unstageable. This currently represents a stage IV staging. This is all the  way down through muscle layer. There is a rim of tissue over the bone and no palpable exposed bone but this is a deep wound. Fortunately at present no evidence of infection. We have been using silver collagen with backing wet-to-dry. The wound is really no better today but no worse 8/2; patient presents for 2-week follow-up. She has been using the wound VAC for the past week. There has been some issues with keeping the drape in place however home health comes out to reapply the drape when this happens. Overall there are no issues or complaints today. No signs of infection. 8/17; patient has been using the wound VAC on her wound on the upper left buttock. About 50 to 60% of this area is fully granulated however from about 10-5 o'clock there is still undermining. UNFORTUNATELY she also arrives in clinic today with a wound over her right fibular head. According to her daughter has been there for about 2 weeks. She wonders whether there is an abrasion although no concrete history of this. Given her frail status this is probably most likely a pressure ulcer over a bony prominence [fibular head] 8/31 unfortunately the upper left buttock pressure ulcer stage IV is not as optimistically better as I was hoping. The granulation that I identified on 12/23/20 is no longer adherent at the inferior pole the undermining is quite extensive but there is no palpable bone. No evidence of surrounding infection. There may be some improvement in the undermining measurements We are still dealing with the additional wound we identified 2 weeks ago on the right fibular head as well we have been using silver alginate here 9/14; 1 month follow-up. She has now had the wound VAC on for probably 2 months. No major change here if she still has the tunneling area at 12:00 and undermining from about 6-12. Surface of the wound does not look healthy. We use MolecuLight to look at the wound surface which actually looked negative however  she has extensive immunofluorescence in the wound edge from about 12-6 o'clock this actually extends beyond the visible wound margin by quite amount. 9/27; no major change in the left buttock wound. There is still undermining however no exposed bone. Granulation looks reasonable. Right fibular head is much smaller We are using Hydrofera Blue on the right fibular head silver alginate on the buttock area 10/12; left buttock wound is large with undermining. I think this is about the same as last time granulation looks reasonable there is no exposed bone. The area on the fibular head is almost completely closed 11/30; left buttock wound large wound with undermining superiorly. As her daughter points out there is some change in the granulation superiorly and more darker red color also some raised areas of hypergranulation. I cannot tell that the area is infected. We are using silver alginate she is changing this daily On the right fibular head the area is just about closed Electronic Signature(s) Signed: 04/07/2021 4:53:46 PM By: Linton Ham MD Entered By: Linton Ham on 04/07/2021 11:03:00 -------------------------------------------------------------------------------- Physical Exam Details Patient Name: Date of Service: [name], [name] 04/07/2021 9:30 A M Medical Record Number: [id number] Patient Account Number: [id number] Date of Birth/Sex: Treating RN: April 29, 1936 (85 y.o. [name] Primary Care Provider:  [name] Other Clinician: Referring Provider: Treating Provider/Extender: Deloria Lair in Treatment: 17 Constitutional Patient is hypertensive.. Pulse regular and within target range for patient.Marland Kitchen Respirations regular, non-labored and within target range.. Temperature is normal and within the target range for the patient.Marland Kitchen Appears in no distress. Notes Wound exam; left buttock. Substantial wound over the ischial tuberosity there is  undermining superiorly but again with no bone. There is differences in texture and color of the tissue on the surface. Her daughter was concerned about this. I do not think this represents active infection although it is difficult to be certain. The entire wound is somewhat tender but there does not appear to be surrounding soft tissue involvement. Electronic Signature(s) Signed: 04/07/2021 4:53:46 PM By: Linton Ham MD Entered By: Linton Ham on 04/07/2021 11:04:18 -------------------------------------------------------------------------------- Physician Orders Details Patient Name: Date of Service: [name], [name] 04/07/2021 9:30 A M Medical Record Number: [id number] Patient Account Number: [id number] Date of Birth/Sex: Treating RN: 1935/12/27 (85 y.o. [name] Primary Care Provider: [name] Other Clinician: Referring Provider: Treating Provider/Extender: Deloria Lair in Treatment: 26 Verbal / Phone Orders: No Diagnosis Coding ICD-10 Coding Code Description [phone] Pressure ulcer of left buttock, stage 4 E11.9 Type 2 diabetes mellitus without complications W40.97 Pressure ulcer of unspecified site, stage 3 Follow-up Appointments Return appointment in 1 month. - with Dr. Dellia Nims Bathing/ Shower/ Hygiene May shower and wash wound with soap and water. - prior to dressing change Off-Loading Low air-loss mattress (Group 2) Turn and reposition every 2 hours Additional Orders / Instructions Follow Nutritious Diet - 100-120g of Protein Home Health No change in wound care orders this week; continue Home Health for wound care. May utilize formulary equivalent dressing for wound treatment orders unless otherwise specified. Other Home Health Orders/Instructions: - [facility] Wound Treatment Wound #2 - Gluteus Wound Laterality: Left Cleanser: Soap and Water (Home Health) 1 x Per Day/30 Days Discharge Instructions: May shower and wash wound  with dial antibacterial soap and water prior to dressing change. Prim Dressing: KerraCel Ag Gelling Fiber Dressing, 4x5 in (silver alginate) (Home Health) 1 x Per Day/30 Days ary Discharge Instructions: Apply silver alginate to wound bed as instructed Secondary Dressing: Woven Gauze Sponge, Non-Sterile 4x4 in (Home Health) 1 x Per Day/30 Days Discharge Instructions: Dry gauze backing over alginate Secondary Dressing: ABD Pad, 8x10 (Home Health) 1 x Per Day/30 Days Discharge Instructions: Apply over primary dressing as directed. Secured With: 10M Medipore H Soft Cloth Surgical T 4 x 2 (in/yd) (Home Health) 1 x Per Day/30 Days ape Discharge Instructions: Secure dressing with tape as directed. Wound #3 - Lower Leg Wound Laterality: Right, Lateral Cleanser: Soap and Water [facility]) Every Other Day/30 Days Discharge Instructions: May shower and wash wound with dial antibacterial soap and water prior to dressing change. Cleanser: Wound Cleanser [facility]) Every Other Day/30 Days Discharge Instructions: Cleanse the wound with wound cleanser prior to applying a clean dressing using gauze sponges, not tissue or cotton balls. Prim Dressing: Hydrofera Blue Ready Foam, 2.5 x2.5 in [facility]) Every Other Day/30 Days ary Discharge Instructions: Apply to wound bed as instructed Secondary Dressing: Zetuvit Plus Silicone Border Dressing 4x4 (in/in) ([place]) Every Other Day/30 Days Discharge Instructions: Apply silicone border over primary dressing as directed. Electronic Signature(s) Signed: 04/07/2021 4:53:46 PM By: Linton Ham MD Signed: 04/07/2021 5:07:48 PM By: Levan Hurst RN, BSN Entered By: Levan Hurst on 04/07/2021 10:46:25 -------------------------------------------------------------------------------- Problem List Details Patient Name: Date of  Service: DONNABELLE, BLANCHARD 04/07/2021 9:30 A M Medical Record Number: [id number] Patient Account Number: [id number] Date of  Birth/Sex: Treating RN: 1935-08-09 (85 y.o. [name] Primary Care Provider: [name] Other Clinician: Referring Provider: Treating Provider/Extender: Deloria Lair in Treatment: 26 Active Problems ICD-10 Encounter Code Description Active Date MDM Diagnosis L89.324 Pressure ulcer of left buttock, stage 4 11/24/2020 No Yes E11.9 Type 2 diabetes mellitus without complications 05/14/6061 No Yes L89.93 Pressure ulcer of unspecified site, stage 3 12/23/2020 No Yes Inactive Problems ICD-10 Code Description Active Date Inactive Date L89.320 Pressure ulcer of left buttock, unstageable 10/13/2020 10/13/2020 Resolved Problems Electronic Signature(s) Signed: 04/07/2021 4:53:46 PM By: Linton Ham MD Entered By: Linton Ham on 04/07/2021 11:00:20 -------------------------------------------------------------------------------- Progress Note Details Patient Name: Date of Service: [name] 04/07/2021 9:30 A M Medical Record Number: [id number] Patient Account Number: [id number] Date of Birth/Sex: Treating RN: May 19, 1935 (85 y.o. [name] Primary Care Provider: [name] Other Clinician: Referring Provider: Treating Provider/Extender: Deloria Lair in Treatment: 26 Subjective History of Present Illness (HPI) Admission 5/27 Ms. Tianah Lonardo is an 85 year old female with a past medical history of type 2 diabetes on oral agents, hypertension and dementia that presents today to the clinic for a wound to her right buttocks. Her daughter-in-law is present today and helps provide the history. This issue has been going on for 2 to 3 weeks now. She states she developed an eschar about 1 week ago. She has been taking doxycycline for possible soft tissue infection to the area prescribed by her primary care provider. Patient currently denies any signs of infection. 6/3; patient admitted to the clinic last week. She has  a large necrotic area on her left buttock. They use Santyl last week. On arrival this visit she had completely necrotic surface with open to subcutaneous tissue. The surface was completely nonviable. They have been using Santyl. She apparently had been on doxycycline which she is just finishing prescribed by her primary doctor for possible soft tissue infection 6/7; patient with a large necrotic wound over her left buttock. Aggressive debridement last week. Culture of this grew Proteus unfortunately would not of been covered well or at least predictably by the doxycycline that her primary doctor put her on. I have therefore put her on Augmentin suspension 3 times a day. We are using silver alginate as the primary dressing while we deal with the infection. Her daughter has a list of concerns including swallowing difficulties, concerns about aspiration. The patient has advanced dementia. If this is Alzheimer's disease it is at its preterminal stages. I went over that swallowing difficulties are part of what happens in the preterminal stages of Alzheimer's disease. Nevertheless we will family members seems to want to pursue a very aggressive course 6/21 large wound over her left buttock. She has completed the antibiotics I gave her [Augmentin]. We are using silver alginate while we are dealing with the infection and also to help with the drainage In general the wound looks better she has healthy granulation over perhaps 70% of it. Superiorly there is still necrotic debris I did not attempt to debride this today The patient has advanced dementia. I do not know that she could tolerate a wound VAC. She also has double incontinence which would make that difficult. Nevertheless she has been cared for diligently by her family. She is apparently eating well and may be 2 to 3 hours on this area all day 7/5; 2-week follow-up. She  continues with a nice improvement in overall condition of the wound bed. The  undermining area has still some adherent surface slough. We have been using silver alginate because of the drainage and possibility of at least superficial wound infection/colonization. Although I said in previous notes I wondered whether she could tolerate the wound VAC they have done so well with this wound at home I do not want to necessarily rule her out for a wound VAC and I am going to direct the start of a trial of wound VAC therapy if this can be covered by insurance, if we have home health support to change it at all 7/19; we did not get very far with the wound VAC because my original ICD 10 said this was unstageable. As usual this represents a considerable frustration because we would not of ordered a wound VAC if the wound was still unstageable. This currently represents a stage IV staging. This is all the way down through muscle layer. There is a rim of tissue over the bone and no palpable exposed bone but this is a deep wound. Fortunately at present no evidence of infection. We have been using silver collagen with backing wet-to-dry. The wound is really no better today but no worse 8/2; patient presents for 2-week follow-up. She has been using the wound VAC for the past week. There has been some issues with keeping the drape in place however home health comes out to reapply the drape when this happens. Overall there are no issues or complaints today. No signs of infection. 8/17; patient has been using the wound VAC on her wound on the upper left buttock. About 50 to 60% of this area is fully granulated however from about 10-5 o'clock there is still undermining. UNFORTUNATELY she also arrives in clinic today with a wound over her right fibular head. According to her daughter has been there for about 2 weeks. She wonders whether there is an abrasion although no concrete history of this. Given her frail status this is probably most likely a pressure ulcer over a bony prominence [fibular  head] 8/31 unfortunately the upper left buttock pressure ulcer stage IV is not as optimistically better as I was hoping. The granulation that I identified on 12/23/20 is no longer adherent at the inferior pole the undermining is quite extensive but there is no palpable bone. No evidence of surrounding infection. There may be some improvement in the undermining measurements We are still dealing with the additional wound we identified 2 weeks ago on the right fibular head as well we have been using silver alginate here 9/14; 1 month follow-up. She has now had the wound VAC on for probably 2 months. No major change here if she still has the tunneling area at 12:00 and undermining from about 6-12. Surface of the wound does not look healthy. We use MolecuLight to look at the wound surface which actually looked negative however she has extensive immunofluorescence in the wound edge from about 12-6 o'clock this actually extends beyond the visible wound margin by quite amount. 9/27; no major change in the left buttock wound. There is still undermining however no exposed bone. Granulation looks reasonable. oo Right fibular head is much smaller We are using Hydrofera Blue on the right fibular head silver alginate on the buttock area 10/12; left buttock wound is large with undermining. I think this is about the same as last time granulation looks reasonable there is no exposed bone. The area on the fibular head  is almost completely closed 11/30; left buttock wound large wound with undermining superiorly. As her daughter points out there is some change in the granulation superiorly and more darker red color also some raised areas of hypergranulation. I cannot tell that the area is infected. We are using silver alginate she is changing this daily On the right fibular head the area is just about closed Objective Constitutional Patient is hypertensive.. Pulse regular and within target range for patient.Marland Kitchen  Respirations regular, non-labored and within target range.. Temperature is normal and within the target range for the patient.Marland Kitchen Appears in no distress. Vitals Time Taken: 10:15 AM, Height: 68 in, Weight: 110 lbs, BMI: 16.7, Temperature: 97.5 F, Pulse: 78 bpm, Respiratory Rate: 16 breaths/min, Blood Pressure: 149/88 mmHg. General Notes: Wound exam; left buttock. Substantial wound over the ischial tuberosity there is undermining superiorly but again with no bone. There is differences in texture and color of the tissue on the surface. Her daughter was concerned about this. I do not think this represents active infection although it is difficult to be certain. The entire wound is somewhat tender but there does not appear to be surrounding soft tissue involvement. Integumentary (Hair, Skin) Wound #2 status is Open. Original cause of wound was Pressure Injury. The date acquired was: 09/18/2020. The wound has been in treatment 26 weeks. The wound is located on the Left Gluteus. The wound measures 5cm length x 4.1cm width x 2.9cm depth; 16.101cm^2 area and 46.692cm^3 volume. There is Fat Layer (Subcutaneous Tissue) exposed. There is no tunneling noted, however, there is undermining starting at 12:00 and ending at 6:00 with a maximum distance of 3.7cm. There is a medium amount of serosanguineous drainage noted. The wound margin is epibole. There is large (67-100%) red, pink granulation within the wound bed. There is a small (1-33%) amount of necrotic tissue within the wound bed including Adherent Slough. Wound #3 status is Open. Original cause of wound was Pressure Injury. The date acquired was: 12/16/2020. The wound has been in treatment 15 weeks. The wound is located on the Right,Lateral Lower Leg. The wound measures 0.2cm length x 0.2cm width x 0.1cm depth; 0.031cm^2 area and 0.003cm^3 volume. There is Fat Layer (Subcutaneous Tissue) exposed. There is no tunneling or undermining noted. There is a medium  amount of serosanguineous drainage noted. The wound margin is well defined and not attached to the wound base. There is large (67-100%) red, pink granulation within the wound bed. There is a small (1- 33%) amount of necrotic tissue within the wound bed including Adherent Slough. Assessment Active Problems ICD-10 Pressure ulcer of left buttock, stage 4 Type 2 diabetes mellitus without complications Pressure ulcer of unspecified site, stage 3 Plan Follow-up Appointments: Return appointment in 1 month. - with Dr. Dellia Nims Bathing/ Shower/ Hygiene: May shower and wash wound with soap and water. - prior to dressing change Off-Loading: Low air-loss mattress (Group 2) Turn and reposition every 2 hours Additional Orders / Instructions: Follow Nutritious Diet - 100-120g of Protein Home Health: No change in wound care orders this week; continue Home Health for wound care. May utilize formulary equivalent dressing for wound treatment orders unless otherwise specified. Other Home Health Orders/Instructions: - [facility] WOUND #2: - Gluteus Wound Laterality: Left Cleanser: Soap and Water (Home Health) 1 x Per Day/30 Days Discharge Instructions: May shower and wash wound with dial antibacterial soap and water prior to dressing change. Prim Dressing: KerraCel Ag Gelling Fiber Dressing, 4x5 in (silver alginate) (Home Health) 1 x Per Day/30  Days ary Discharge Instructions: Apply silver alginate to wound bed as instructed Secondary Dressing: Woven Gauze Sponge, Non-Sterile 4x4 in (Home Health) 1 x Per Day/30 Days Discharge Instructions: Dry gauze backing over alginate Secondary Dressing: ABD Pad, 8x10 (Home Health) 1 x Per Day/30 Days Discharge Instructions: Apply over primary dressing as directed. Secured With: 56M Medipore H Soft Cloth Surgical T 4 x 2 (in/yd) (Home Health) 1 x Per Day/30 Days ape Discharge Instructions: Secure dressing with tape as directed. WOUND #3: - Lower Leg Wound Laterality:  Right, Lateral Cleanser: Soap and Water [facility]) Every Other Day/30 Days Discharge Instructions: May shower and wash wound with dial antibacterial soap and water prior to dressing change. Cleanser: Wound Cleanser [facility]) Every Other Day/30 Days Discharge Instructions: Cleanse the wound with wound cleanser prior to applying a clean dressing using gauze sponges, not tissue or cotton balls. Prim Dressing: Hydrofera Blue Ready Foam, 2.5 x2.5 in [facility]) Every Other Day/30 Days ary Discharge Instructions: Apply to wound bed as instructed Secondary Dressing: Zetuvit Plus Silicone Border Dressing 4x4 (in/in) ([place]) Every Other Day/30 Days Discharge Instructions: Apply silicone border over primary dressing as directed. #1 at this point I continued with silver alginate 2. Sometimes changes in the texture or color of underlying granulation or indicative of underlying infection although I could not exactly prove this. I have asked the daughter to keep an eye on this. We also have home health and if they become concerned to bring her back. I would likely have to do a deep tissue culture. 3. Could consider [employer] Electronic Signature(s) Signed: 04/07/2021 4:53:46 PM By: Linton Ham MD Entered By: Linton Ham on 04/07/2021 11:05:30 -------------------------------------------------------------------------------- SuperBill Details Patient Name: Date of Service: [name] 04/07/2021 Medical Record Number: [id number] Patient Account Number: [id number] Date of Birth/Sex: Treating RN: 1935/12/23 (85 y.o. [name] Primary Care Provider: [name] Other Clinician: Referring Provider: Treating Provider/Extender: Deloria Lair in Treatment: 26 Diagnosis Coding ICD-10 Codes Code Description [phone] Pressure ulcer of left buttock, stage 4 E11.9 Type 2 diabetes mellitus without complications T36.46 Pressure ulcer of  unspecified site, stage 3 Facility Procedures CPT4 Code: [id number] Description: 99214 - WOUND CARE VISIT-LEV 4 EST PT Modifier: Quantity: 1 Physician Procedures : CPT4 Code Description Modifier [id number] - WC PHYS LEVEL 3 - EST PT ICD-10 Diagnosis Description L89.324 Pressure ulcer of left buttock, stage 4 Quantity: 1 Electronic Signature(s) Signed: 04/07/2021 4:53:46 PM By: Linton Ham MD Signed: 04/07/2021 5:07:48 PM By: Levan Hurst RN, BSN Entered By: Levan Hurst on 04/07/2021 11:29:47

## 2021-04-09 DIAGNOSIS — F0390 Unspecified dementia without behavioral disturbance: Secondary | ICD-10-CM | POA: Diagnosis not present

## 2021-04-09 DIAGNOSIS — E785 Hyperlipidemia, unspecified: Secondary | ICD-10-CM | POA: Diagnosis not present

## 2021-04-09 DIAGNOSIS — L89893 Pressure ulcer of other site, stage 3: Secondary | ICD-10-CM | POA: Diagnosis not present

## 2021-04-09 DIAGNOSIS — E119 Type 2 diabetes mellitus without complications: Secondary | ICD-10-CM | POA: Diagnosis not present

## 2021-04-09 DIAGNOSIS — L89320 Pressure ulcer of left buttock, unstageable: Secondary | ICD-10-CM | POA: Diagnosis not present

## 2021-04-09 DIAGNOSIS — I890 Lymphedema, not elsewhere classified: Secondary | ICD-10-CM | POA: Diagnosis not present

## 2021-04-14 DIAGNOSIS — E119 Type 2 diabetes mellitus without complications: Secondary | ICD-10-CM | POA: Diagnosis not present

## 2021-04-14 DIAGNOSIS — L89893 Pressure ulcer of other site, stage 3: Secondary | ICD-10-CM | POA: Diagnosis not present

## 2021-04-14 DIAGNOSIS — L89320 Pressure ulcer of left buttock, unstageable: Secondary | ICD-10-CM | POA: Diagnosis not present

## 2021-04-14 DIAGNOSIS — E785 Hyperlipidemia, unspecified: Secondary | ICD-10-CM | POA: Diagnosis not present

## 2021-04-14 DIAGNOSIS — I890 Lymphedema, not elsewhere classified: Secondary | ICD-10-CM | POA: Diagnosis not present

## 2021-04-14 DIAGNOSIS — F0390 Unspecified dementia without behavioral disturbance: Secondary | ICD-10-CM | POA: Diagnosis not present

## 2021-04-15 ENCOUNTER — Other Ambulatory Visit: Payer: Self-pay | Admitting: *Deleted

## 2021-04-15 DIAGNOSIS — E785 Hyperlipidemia, unspecified: Secondary | ICD-10-CM

## 2021-04-15 DIAGNOSIS — E1169 Type 2 diabetes mellitus with other specified complication: Secondary | ICD-10-CM

## 2021-04-15 MED ORDER — ATORVASTATIN CALCIUM 40 MG PO TABS: 40.0000 mg | ORAL_TABLET | Freq: Every day | ORAL | 1 refills | Status: DC

## 2021-04-15 MED ORDER — METFORMIN HCL 500 MG PO TABS: 500.0000 mg | ORAL_TABLET | Freq: Two times a day (BID) | ORAL | 1 refills | Status: DC

## 2021-04-15 NOTE — Telephone Encounter (Signed)
[name] called requesting refill to be sent to [facility]

## 2021-04-22 ENCOUNTER — Telehealth: Payer: Self-pay | Admitting: *Deleted

## 2021-04-22 DIAGNOSIS — E119 Type 2 diabetes mellitus without complications: Secondary | ICD-10-CM | POA: Diagnosis not present

## 2021-04-22 DIAGNOSIS — F0390 Unspecified dementia without behavioral disturbance: Secondary | ICD-10-CM | POA: Diagnosis not present

## 2021-04-22 DIAGNOSIS — L89893 Pressure ulcer of other site, stage 3: Secondary | ICD-10-CM | POA: Diagnosis not present

## 2021-04-22 DIAGNOSIS — L89320 Pressure ulcer of left buttock, unstageable: Secondary | ICD-10-CM | POA: Diagnosis not present

## 2021-04-22 DIAGNOSIS — E785 Hyperlipidemia, unspecified: Secondary | ICD-10-CM | POA: Diagnosis not present

## 2021-04-22 DIAGNOSIS — I890 Lymphedema, not elsewhere classified: Secondary | ICD-10-CM | POA: Diagnosis not present

## 2021-04-22 NOTE — Telephone Encounter (Signed)
Received fax from [place] form for Continued Need Documentation for Medical Equipment, Mattress Low Air Loss 350lb. Needs documentation that the equipment is needed.   Patient's Next Appointment is on: 07/05/2021  Placed form in [place] folder to review and sign. To be faxed back to Fax: [phone] once completed.

## 2021-04-27 DIAGNOSIS — Z8673 Personal history of transient ischemic attack (TIA), and cerebral infarction without residual deficits: Secondary | ICD-10-CM | POA: Diagnosis not present

## 2021-04-27 DIAGNOSIS — I890 Lymphedema, not elsewhere classified: Secondary | ICD-10-CM | POA: Diagnosis not present

## 2021-04-27 DIAGNOSIS — Z86718 Personal history of other venous thrombosis and embolism: Secondary | ICD-10-CM | POA: Diagnosis not present

## 2021-04-27 DIAGNOSIS — Z86711 Personal history of pulmonary embolism: Secondary | ICD-10-CM | POA: Diagnosis not present

## 2021-04-27 DIAGNOSIS — I10 Essential (primary) hypertension: Secondary | ICD-10-CM | POA: Diagnosis not present

## 2021-04-27 DIAGNOSIS — E785 Hyperlipidemia, unspecified: Secondary | ICD-10-CM | POA: Diagnosis not present

## 2021-04-27 DIAGNOSIS — G609 Hereditary and idiopathic neuropathy, unspecified: Secondary | ICD-10-CM | POA: Diagnosis not present

## 2021-04-27 DIAGNOSIS — K5900 Constipation, unspecified: Secondary | ICD-10-CM | POA: Diagnosis not present

## 2021-04-27 DIAGNOSIS — F0390 Unspecified dementia without behavioral disturbance: Secondary | ICD-10-CM | POA: Diagnosis not present

## 2021-04-27 DIAGNOSIS — M6281 Muscle weakness (generalized): Secondary | ICD-10-CM | POA: Diagnosis not present

## 2021-04-27 DIAGNOSIS — E119 Type 2 diabetes mellitus without complications: Secondary | ICD-10-CM | POA: Diagnosis not present

## 2021-04-27 DIAGNOSIS — L89893 Pressure ulcer of other site, stage 3: Secondary | ICD-10-CM | POA: Diagnosis not present

## 2021-04-27 DIAGNOSIS — Z741 Need for assistance with personal care: Secondary | ICD-10-CM | POA: Diagnosis not present

## 2021-04-27 DIAGNOSIS — Z7984 Long term (current) use of oral hypoglycemic drugs: Secondary | ICD-10-CM | POA: Diagnosis not present

## 2021-04-27 DIAGNOSIS — Z7901 Long term (current) use of anticoagulants: Secondary | ICD-10-CM | POA: Diagnosis not present

## 2021-04-27 DIAGNOSIS — L89320 Pressure ulcer of left buttock, unstageable: Secondary | ICD-10-CM | POA: Diagnosis not present

## 2021-04-28 ENCOUNTER — Other Ambulatory Visit: Payer: Medicare Other | Admitting: *Deleted

## 2021-04-28 ENCOUNTER — Other Ambulatory Visit: Payer: Self-pay

## 2021-04-28 DIAGNOSIS — E785 Hyperlipidemia, unspecified: Secondary | ICD-10-CM | POA: Diagnosis not present

## 2021-04-28 DIAGNOSIS — I890 Lymphedema, not elsewhere classified: Secondary | ICD-10-CM | POA: Diagnosis not present

## 2021-04-28 DIAGNOSIS — F0390 Unspecified dementia without behavioral disturbance: Secondary | ICD-10-CM | POA: Diagnosis not present

## 2021-04-28 DIAGNOSIS — L89893 Pressure ulcer of other site, stage 3: Secondary | ICD-10-CM | POA: Diagnosis not present

## 2021-04-28 DIAGNOSIS — E119 Type 2 diabetes mellitus without complications: Secondary | ICD-10-CM | POA: Diagnosis not present

## 2021-04-28 DIAGNOSIS — L89320 Pressure ulcer of left buttock, unstageable: Secondary | ICD-10-CM | POA: Diagnosis not present

## 2021-04-29 ENCOUNTER — Other Ambulatory Visit: Payer: Self-pay | Admitting: *Deleted

## 2021-04-29 MED ORDER — ACCU-CHEK GUIDE VI STRP: ORAL_STRIP | 3 refills | Status: AC

## 2021-04-29 MED ORDER — ACCU-CHEK GUIDE CONTROL VI LIQD: 3 refills | Status: AC

## 2021-04-29 NOTE — Telephone Encounter (Signed)
Patient Caregiver called and stated that patient's insurance no longer covers patient's current blood sugar machine  Stated that they will cover the Accu Chek Guide.   Requesting the strips to be called in for this machine. Stated that she has already bought the machine at the pharmacy out of pocket.   Also requesting the testing solutions.

## 2021-04-30 MED ORDER — ACCU-CHEK GUIDE W/DEVICE KIT: PACK | 0 refills | Status: AC

## 2021-04-30 NOTE — Telephone Encounter (Signed)
Patient caregiver called back and stated that the meter that she purchased OTC is acting up and requesting a Rx to be sent for new one.

## 2021-04-30 NOTE — Addendum Note (Signed)
Addended by: Rafael Bihari A on: 04/30/2021 12:01 PM   Modules accepted: Orders

## 2021-05-05 ENCOUNTER — Other Ambulatory Visit: Payer: Self-pay

## 2021-05-05 ENCOUNTER — Encounter (HOSPITAL_BASED_OUTPATIENT_CLINIC_OR_DEPARTMENT_OTHER): Payer: Medicare Other | Attending: Internal Medicine | Admitting: Internal Medicine

## 2021-05-05 DIAGNOSIS — L89899 Pressure ulcer of other site, unspecified stage: Secondary | ICD-10-CM | POA: Diagnosis not present

## 2021-05-05 DIAGNOSIS — I10 Essential (primary) hypertension: Secondary | ICD-10-CM | POA: Insufficient documentation

## 2021-05-05 DIAGNOSIS — F0390 Unspecified dementia without behavioral disturbance: Secondary | ICD-10-CM | POA: Diagnosis not present

## 2021-05-05 DIAGNOSIS — L89324 Pressure ulcer of left buttock, stage 4: Secondary | ICD-10-CM | POA: Insufficient documentation

## 2021-05-05 DIAGNOSIS — E119 Type 2 diabetes mellitus without complications: Secondary | ICD-10-CM | POA: Diagnosis not present

## 2021-05-05 DIAGNOSIS — Z7984 Long term (current) use of oral hypoglycemic drugs: Secondary | ICD-10-CM | POA: Insufficient documentation

## 2021-05-05 NOTE — Progress Notes (Signed)
[name] ([id number]) , Visit Report for 05/05/2021 HPI Details Patient Name: Date of Service: DORTHA, NEIGHBORS 05/05/2021 9:30 [place] Record Number: [id number] Patient Account Number: [id number] Date of Birth/Sex: Treating RN: April 25, 1936 (85 y.o. [name] Primary Care Provider: [name] Other Clinician: Referring Provider: Treating Provider/Extender: Deloria Lair in Treatment: 30 History of Present Illness HPI Description: Admission 5/27 Ms. Katherine Tout is an 85 year old female with a past medical history of type 2 diabetes on oral agents, hypertension and dementia that presents today to the clinic for a wound to her right buttocks. Her daughter-in-law is present today and helps provide the history. This issue has been going on for 2 to 3 weeks now. She states she developed an eschar about 1 week ago. She has been taking doxycycline for possible soft tissue infection to the area prescribed by her primary care provider. Patient currently denies any signs of infection. 6/3; patient admitted to the clinic last week. She has a large necrotic area on her left buttock. They use Santyl last week. On arrival this visit she had completely necrotic surface with open to subcutaneous tissue. The surface was completely nonviable. They have been using Santyl. She apparently had been on doxycycline which she is just finishing prescribed by her primary doctor for possible soft tissue infection 6/7; patient with a large necrotic wound over her left buttock. Aggressive debridement last week. Culture of this grew Proteus unfortunately would not of been covered well or at least predictably by the doxycycline that her primary doctor put her on. I have therefore put her on Augmentin suspension 3 times a day. We are using silver alginate as the primary dressing while we deal with the infection. Her daughter has a list of concerns including swallowing  difficulties, concerns about aspiration. The patient has advanced dementia. If this is Alzheimer's disease it is at its preterminal stages. I went over that swallowing difficulties are part of what happens in the preterminal stages of Alzheimer's disease. Nevertheless we will family members seems to want to pursue a very aggressive course 6/21 large wound over her left buttock. She has completed the antibiotics I gave her [Augmentin]. We are using silver alginate while we are dealing with the infection and also to help with the drainage In general the wound looks better she has healthy granulation over perhaps 70% of it. Superiorly there is still necrotic debris I did not attempt to debride this today The patient has advanced dementia. I do not know that she could tolerate a wound VAC. She also has double incontinence which would make that difficult. Nevertheless she has been cared for diligently by her family. She is apparently eating well and may be 2 to 3 hours on this area all day 7/5; 2-week follow-up. She continues with a nice improvement in overall condition of the wound bed. The undermining area has still some adherent surface slough. We have been using silver alginate because of the drainage and possibility of at least superficial wound infection/colonization. Although I said in previous notes I wondered whether she could tolerate the wound VAC they have done so well with this wound at home I do not want to necessarily rule her out for a wound VAC and I am going to direct the start of a trial of wound VAC therapy if this can be covered by insurance, if we have home health support to change it at all 7/19; we did not get very far with the wound  VAC because my original ICD 10 said this was unstageable. As usual this represents a considerable frustration because we would not of ordered a wound VAC if the wound was still unstageable. This currently represents a stage IV staging. This is all the  way down through muscle layer. There is a rim of tissue over the bone and no palpable exposed bone but this is a deep wound. Fortunately at present no evidence of infection. We have been using silver collagen with backing wet-to-dry. The wound is really no better today but no worse 8/2; patient presents for 2-week follow-up. She has been using the wound VAC for the past week. There has been some issues with keeping the drape in place however home health comes out to reapply the drape when this happens. Overall there are no issues or complaints today. No signs of infection. 8/17; patient has been using the wound VAC on her wound on the upper left buttock. About 50 to 60% of this area is fully granulated however from about 10-5 o'clock there is still undermining. UNFORTUNATELY she also arrives in clinic today with a wound over her right fibular head. According to her daughter has been there for about 2 weeks. She wonders whether there is an abrasion although no concrete history of this. Given her frail status this is probably most likely a pressure ulcer over a bony prominence [fibular head] 8/31 unfortunately the upper left buttock pressure ulcer stage IV is not as optimistically better as I was hoping. The granulation that I identified on 12/23/20 is no longer adherent at the inferior pole the undermining is quite extensive but there is no palpable bone. No evidence of surrounding infection. There may be some improvement in the undermining measurements We are still dealing with the additional wound we identified 2 weeks ago on the right fibular head as well we have been using silver alginate here 9/14; 1 month follow-up. She has now had the wound VAC on for probably 2 months. No major change here if she still has the tunneling area at 12:00 and undermining from about 6-12. Surface of the wound does not look healthy. We use MolecuLight to look at the wound surface which actually looked negative however  she has extensive immunofluorescence in the wound edge from about 12-6 o'clock this actually extends beyond the visible wound margin by quite amount. 9/27; no major change in the left buttock wound. There is still undermining however no exposed bone. Granulation looks reasonable. Right fibular head is much smaller We are using Hydrofera Blue on the right fibular head silver alginate on the buttock area 10/12; left buttock wound is large with undermining. I think this is about the same as last time granulation looks reasonable there is no exposed bone. The area on the fibular head is almost completely closed 11/30; left buttock wound large wound with undermining superiorly. As her daughter points out there is some change in the granulation superiorly and more darker red color also some raised areas of hypergranulation. I cannot tell that the area is infected. We are using silver alginate she is changing this daily On the right fibular head the area is just about closed 12/28; the patient's wound over her right fibular head is closed. Her left buttock ulcer looks actually a little better. Measurements are slightly improved. There is no exposed bone. The patient's daughter came in asking me about more aggressive options to attempt to get closure of the subcu wound including surgery. I thought we had  mostly agreed that this would be a palliative type setting and that I did not expect this to actually close nevertheless I tried to answer her questions. I do not think the patient is a candidate for a myocutaneous flap. She is simply too frail and I doubt plastic surgery would consider her a viable candidate. She might be a candidate for an advanced treatment option like Oasis which sometimes can stimulate granulation to get these wounds to gradually close. I told her in order for this to make sense she would need probably a Foley catheter to control the incontinence and unfortunately weekly visits which  would be difficult on this patient Electronic Signature(s) Signed: 05/05/2021 3:46:55 PM By: Linton Ham MD Entered By: Linton Ham on 05/05/2021 10:43:37 -------------------------------------------------------------------------------- Physical Exam Details Patient Name: Date of Service: [name] 05/05/2021 9:30 A M Medical Record Number: [id number] Patient Account Number: [id number] Date of Birth/Sex: Treating RN: [birth year] (85 y.o. [name] Primary Care Provider: [name] Other Clinician: Referring Provider: Treating Provider/Extender: Deloria Lair in Treatment: 69 Constitutional Patient is hypertensive.. Pulse regular and within target range for patient.Marland Kitchen Respirations regular, non-labored and within target range.. Temperature is normal and within the target range for the patient.Marland Kitchen Appears in no distress. Notes Wound exam; left buttock. Substantial wound over the ischial tuberosity but there is no exposed bone the undermining seems to be better superiorly. Her granulation looks satisfactory I was somewhat concerned about this last time there is no evidence of active infection Electronic Signature(s) Signed: 05/05/2021 3:46:55 PM By: Linton Ham MD Entered By: Linton Ham on 05/05/2021 10:44:23 -------------------------------------------------------------------------------- Physician Orders Details Patient Name: Date of Service: [name] 05/05/2021 9:30 [place] Record Number: [id number] Patient Account Number: [id number] Date of Birth/Sex: Treating RN: 07-21-1935 (85 y.o. [name] Primary Care Provider: [name] Other Clinician: Referring Provider: Treating Provider/Extender: Deloria Lair in Treatment: 22 Verbal / Phone Orders: No Diagnosis Coding ICD-10 Coding Code Description [phone] Pressure ulcer of left buttock, stage 4 E11.9 Type 2 diabetes mellitus  without complications Z66.29 Pressure ulcer of unspecified site, stage 3 Follow-up Appointments Return appointment in 1 month. - with Dr. Dellia Nims Bathing/ Shower/ Hygiene May shower and wash wound with soap and water. - prior to dressing change Off-Loading Low air-loss mattress (Group 2) Turn and reposition every 2 hours Additional Orders / Instructions Follow Nutritious Diet - 100-120g of Protein Home Health No change in wound care orders this week; continue Home Health for wound care. May utilize formulary equivalent dressing for wound treatment orders unless otherwise specified. Other Home Health Orders/Instructions: - [facility] Wound Treatment Wound #2 - Gluteus Wound Laterality: Left Cleanser: Soap and Water (Home Health) 1 x Per Day/30 Days Discharge Instructions: May shower and wash wound with dial antibacterial soap and water prior to dressing change. Prim Dressing: KerraCel Ag Gelling Fiber Dressing, 4x5 in (silver alginate) (Home Health) 1 x Per Day/30 Days ary Discharge Instructions: Apply silver alginate to wound bed as instructed Secondary Dressing: Woven Gauze Sponge, Non-Sterile 4x4 in (Home Health) 1 x Per Day/30 Days Discharge Instructions: Dry gauze backing over alginate Secondary Dressing: ABD Pad, 8x10 (Home Health) 1 x Per Day/30 Days Discharge Instructions: Apply over primary dressing as directed. Secured With: 48M Medipore H Soft Cloth Surgical T 4 x 2 (in/yd) (Home Health) 1 x Per Day/30 Days ape Discharge Instructions: Secure dressing with tape as directed. Electronic Signature(s) Signed: 05/05/2021 3:46:55 PM By: Linton Ham MD Signed:  05/05/2021 5:39:42 PM By: Levan Hurst RN, BSN Entered By: Levan Hurst on 05/05/2021 10:30:48 -------------------------------------------------------------------------------- Problem List Details Patient Name: Date of Service: NUBIA, ZIESMER 05/05/2021 9:30 A M Medical Record Number: [id number] Patient Account  Number: [id number] Date of Birth/Sex: Treating RN: 1935-10-28 (85 y.o. [name] Primary Care Provider: [name] Other Clinician: Referring Provider: Treating Provider/Extender: Deloria Lair in Treatment: 20 Active Problems ICD-10 Encounter Code Description Active Date MDM Diagnosis [phone] Pressure ulcer of left buttock, stage 4 11/24/2020 No Yes E11.9 Type 2 diabetes mellitus without complications 05/15/3843 No Yes Inactive Problems ICD-10 Code Description Active Date Inactive Date L89.320 Pressure ulcer of left buttock, unstageable 10/13/2020 10/13/2020 L89.93 Pressure ulcer of unspecified site, stage 3 12/23/2020 12/23/2020 Resolved Problems Electronic Signature(s) Signed: 05/05/2021 3:46:55 PM By: Linton Ham MD Entered By: Linton Ham on 05/05/2021 10:40:12 -------------------------------------------------------------------------------- Progress Note Details Patient Name: Date of Service: [name] 05/05/2021 9:30 A M Medical Record Number: [id number] Patient Account Number: [id number] Date of Birth/Sex: Treating RN: 06-17-1935 (85 y.o. [name] Primary Care Provider: [name] Other Clinician: Referring Provider: Treating Provider/Extender: Deloria Lair in Treatment: 30 Subjective History of Present Illness (HPI) Admission 5/27 Ms. Sakeena Teall is an 85 year old female with a past medical history of type 2 diabetes on oral agents, hypertension and dementia that presents today to the clinic for a wound to her right buttocks. Her daughter-in-law is present today and helps provide the history. This issue has been going on for 2 to 3 weeks now. She states she developed an eschar about 1 week ago. She has been taking doxycycline for possible soft tissue infection to the area prescribed by her primary care provider. Patient currently denies any signs of infection. 6/3; patient admitted  to the clinic last week. She has a large necrotic area on her left buttock. They use Santyl last week. On arrival this visit she had completely necrotic surface with open to subcutaneous tissue. The surface was completely nonviable. They have been using Santyl. She apparently had been on doxycycline which she is just finishing prescribed by her primary doctor for possible soft tissue infection 6/7; patient with a large necrotic wound over her left buttock. Aggressive debridement last week. Culture of this grew Proteus unfortunately would not of been covered well or at least predictably by the doxycycline that her primary doctor put her on. I have therefore put her on Augmentin suspension 3 times a day. We are using silver alginate as the primary dressing while we deal with the infection. Her daughter has a list of concerns including swallowing difficulties, concerns about aspiration. The patient has advanced dementia. If this is Alzheimer's disease it is at its preterminal stages. I went over that swallowing difficulties are part of what happens in the preterminal stages of Alzheimer's disease. Nevertheless we will family members seems to want to pursue a very aggressive course 6/21 large wound over her left buttock. She has completed the antibiotics I gave her [Augmentin]. We are using silver alginate while we are dealing with the infection and also to help with the drainage In general the wound looks better she has healthy granulation over perhaps 70% of it. Superiorly there is still necrotic debris I did not attempt to debride this today The patient has advanced dementia. I do not know that she could tolerate a wound VAC. She also has double incontinence which would make that difficult. Nevertheless she has been cared for diligently by her  family. She is apparently eating well and may be 2 to 3 hours on this area all day 7/5; 2-week follow-up. She continues with a nice improvement in overall  condition of the wound bed. The undermining area has still some adherent surface slough. We have been using silver alginate because of the drainage and possibility of at least superficial wound infection/colonization. Although I said in previous notes I wondered whether she could tolerate the wound VAC they have done so well with this wound at home I do not want to necessarily rule her out for a wound VAC and I am going to direct the start of a trial of wound VAC therapy if this can be covered by insurance, if we have home health support to change it at all 7/19; we did not get very far with the wound VAC because my original ICD 10 said this was unstageable. As usual this represents a considerable frustration because we would not of ordered a wound VAC if the wound was still unstageable. This currently represents a stage IV staging. This is all the way down through muscle layer. There is a rim of tissue over the bone and no palpable exposed bone but this is a deep wound. Fortunately at present no evidence of infection. We have been using silver collagen with backing wet-to-dry. The wound is really no better today but no worse 8/2; patient presents for 2-week follow-up. She has been using the wound VAC for the past week. There has been some issues with keeping the drape in place however home health comes out to reapply the drape when this happens. Overall there are no issues or complaints today. No signs of infection. 8/17; patient has been using the wound VAC on her wound on the upper left buttock. About 50 to 60% of this area is fully granulated however from about 10-5 o'clock there is still undermining. UNFORTUNATELY she also arrives in clinic today with a wound over her right fibular head. According to her daughter has been there for about 2 weeks. She wonders whether there is an abrasion although no concrete history of this. Given her frail status this is probably most likely a pressure ulcer over  a bony prominence [fibular head] 8/31 unfortunately the upper left buttock pressure ulcer stage IV is not as optimistically better as I was hoping. The granulation that I identified on 12/23/20 is no longer adherent at the inferior pole the undermining is quite extensive but there is no palpable bone. No evidence of surrounding infection. There may be some improvement in the undermining measurements We are still dealing with the additional wound we identified 2 weeks ago on the right fibular head as well we have been using silver alginate here 9/14; 1 month follow-up. She has now had the wound VAC on for probably 2 months. No major change here if she still has the tunneling area at 12:00 and undermining from about 6-12. Surface of the wound does not look healthy. We use MolecuLight to look at the wound surface which actually looked negative however she has extensive immunofluorescence in the wound edge from about 12-6 o'clock this actually extends beyond the visible wound margin by quite amount. 9/27; no major change in the left buttock wound. There is still undermining however no exposed bone. Granulation looks reasonable. oo Right fibular head is much smaller We are using Hydrofera Blue on the right fibular head silver alginate on the buttock area 10/12; left buttock wound is large with undermining. I think  this is about the same as last time granulation looks reasonable there is no exposed bone. The area on the fibular head is almost completely closed 11/30; left buttock wound large wound with undermining superiorly. As her daughter points out there is some change in the granulation superiorly and more darker red color also some raised areas of hypergranulation. I cannot tell that the area is infected. We are using silver alginate she is changing this daily On the right fibular head the area is just about closed 12/28; the patient's wound over her right fibular head is closed. Her left buttock  ulcer looks actually a little better. Measurements are slightly improved. There is no exposed bone. The patient's daughter came in asking me about more aggressive options to attempt to get closure of the subcu wound including surgery. I thought we had mostly agreed that this would be a palliative type setting and that I did not expect this to actually close nevertheless I tried to answer her questions. I do not think the patient is a candidate for a myocutaneous flap. She is simply too frail and I doubt plastic surgery would consider her a viable candidate. She might be a candidate for an advanced treatment option like Oasis which sometimes can stimulate granulation to get these wounds to gradually close. I told her in order for this to make sense she would need probably a Foley catheter to control the incontinence and unfortunately weekly visits which would be difficult on this patient Objective Constitutional Patient is hypertensive.. Pulse regular and within target range for patient.Marland Kitchen Respirations regular, non-labored and within target range.. Temperature is normal and within the target range for the patient.Marland Kitchen Appears in no distress. Vitals Time Taken: 9:49 AM, Height: 68 in, Weight: 110 lbs, BMI: 16.7, Temperature: 97.5 F, Pulse: 80 bpm, Respiratory Rate: 16 breaths/min, Blood Pressure: 187/99 mmHg. General Notes: Wound exam; left buttock. Substantial wound over the ischial tuberosity but there is no exposed bone the undermining seems to be better superiorly. Her granulation looks satisfactory I was somewhat concerned about this last time there is no evidence of active infection Integumentary (Hair, Skin) Wound #2 status is Open. Original cause of wound was Pressure Injury. The date acquired was: 09/18/2020. The wound has been in treatment 30 weeks. The wound is located on the Left Gluteus. The wound measures 5cm length x 3.5cm width x 2.3cm depth; 13.744cm^2 area and 31.612cm^3 volume. There  is Fat Layer (Subcutaneous Tissue) exposed. There is no tunneling noted, however, there is undermining starting at 8:00 and ending at 1:00 with a maximum distance of 2.8cm. There is a medium amount of serosanguineous drainage noted. The wound margin is epibole. There is large (67-100%) red, pink granulation within the wound bed. There is a small (1-33%) amount of necrotic tissue within the wound bed including Adherent Slough. Wound #3 status is Open. Original cause of wound was Pressure Injury. The date acquired was: 12/16/2020. The wound has been in treatment 19 weeks. The wound is located on the Right,Lateral Lower Leg. The wound measures 0cm length x 0cm width x 0cm depth; 0cm^2 area and 0cm^3 volume. There is no tunneling or undermining noted. There is a none present amount of drainage noted. The wound margin is well defined and not attached to the wound base. There is no granulation within the wound bed. There is no necrotic tissue within the wound bed. Assessment Active Problems ICD-10 Pressure ulcer of left buttock, stage 4 Type 2 diabetes mellitus without complications Plan Follow-up  Appointments: Return appointment in 1 month. - with Dr. Dellia Nims Bathing/ Shower/ Hygiene: May shower and wash wound with soap and water. - prior to dressing change Off-Loading: Low air-loss mattress (Group 2) Turn and reposition every 2 hours Additional Orders / Instructions: Follow Nutritious Diet - 100-120g of Protein Home Health: No change in wound care orders this week; continue Home Health for wound care. May utilize formulary equivalent dressing for wound treatment orders unless otherwise specified. Other Home Health Orders/Instructions: - [facility] WOUND #2: - Gluteus Wound Laterality: Left Cleanser: Soap and Water (Home Health) 1 x Per Day/30 Days Discharge Instructions: May shower and wash wound with dial antibacterial soap and water prior to dressing change. Prim Dressing: KerraCel Ag Gelling  Fiber Dressing, 4x5 in (silver alginate) (Home Health) 1 x Per Day/30 Days ary Discharge Instructions: Apply silver alginate to wound bed as instructed Secondary Dressing: Woven Gauze Sponge, Non-Sterile 4x4 in (Home Health) 1 x Per Day/30 Days Discharge Instructions: Dry gauze backing over alginate Secondary Dressing: ABD Pad, 8x10 (Home Health) 1 x Per Day/30 Days Discharge Instructions: Apply over primary dressing as directed. Secured With: 64M Medipore H Soft Cloth Surgical T 4 x 2 (in/yd) (Home Health) 1 x Per Day/30 Days ape Discharge Instructions: Secure dressing with tape as directed. 1. I continue with the silver alginate based dressings 2. I went over her questions about aggressive medical and surgical options that might be present to this patient. As noted I thought we had previously agreed that this was largely palliative nevertheless I went over this in detail with the patient'ss daughter #3 I saw no evidence of infection here Electronic Signature(s) Signed: 05/05/2021 3:46:55 PM By: Linton Ham MD Entered By: Linton Ham on 05/05/2021 10:45:44 -------------------------------------------------------------------------------- SuperBill Details Patient Name: Date of Service: [name] 05/05/2021 Medical Record Number: [id number] Patient Account Number: [id number] Date of Birth/Sex: Treating RN: 03/17/1936 (85 y.o. [name] Primary Care Provider: [name] Other Clinician: Referring Provider: Treating Provider/Extender: Deloria Lair in Treatment: 30 Diagnosis Coding ICD-10 Codes Code Description [phone] Pressure ulcer of left buttock, stage 4 E11.9 Type 2 diabetes mellitus without complications H41.74 Pressure ulcer of unspecified site, stage 3 Facility Procedures CPT4 Code: [id number] Description: 99213 - WOUND CARE VISIT-LEV 3 EST PT Modifier: Quantity: 1 Physician Procedures : CPT4 Code Description Modifier  [id number] 02637 - WC PHYS LEVEL 3 - EST PT ICD-10 Diagnosis Description L89.324 Pressure ulcer of left buttock, stage 4 E11.9 Type 2 diabetes mellitus without complications Quantity: 1 Electronic Signature(s) Signed: 05/05/2021 3:46:55 PM By: Linton Ham MD Entered By: Linton Ham on 05/05/2021 10:45:59

## 2021-05-05 NOTE — Progress Notes (Signed)
[name] ([id number]) , Visit Report for 05/05/2021 Arrival Information Details Patient Name: Date of Service: [name], [name] 05/05/2021 9:30 [place] Record Number: [id number] Patient Account Number: [id number] Date of Birth/Sex: Treating RN: [birth year] (85 y.o. [name] Primary Care Natalyah Cummiskey: [name] Other Clinician: Referring Rondey Fallen: Treating Prudy Candy/Extender: Deloria Lair in Treatment: 68 Visit Information History Since Last Visit Added or deleted any medications: No Patient Arrived: Ambulatory Any new allergies or adverse reactions: No Arrival Time: 09:49 Had a fall or experienced change in No Accompanied By: caregiver activities of daily living that may affect Transfer Assistance: Manual risk of falls: Patient Identification Verified: Yes Signs or symptoms of abuse/neglect since last visito No Secondary Verification Process Completed: Yes Hospitalized since last visit: No Patient Requires Transmission-Based Precautions: No Implantable device outside of the clinic excluding No Patient Has Alerts: No cellular tissue based products placed in the center since last visit: Has Dressing in Place as Prescribed: Yes Pain Present Now: No Electronic Signature(s) Signed: 05/05/2021 5:39:42 PM By: Levan Hurst RN, BSN Entered By: Levan Hurst on 05/05/2021 09:51:37 -------------------------------------------------------------------------------- Clinic Level of Care Assessment Details Patient Name: Date of Service: [name], [name] 05/05/2021 9:30 [place] Record Number: [id number] Patient Account Number: [id number] Date of Birth/Sex: Treating RN: 12-17-1935 (85 y.o. [name] Primary Care Eboni Coval: [name] Other Clinician: Referring Alydia Gosser: Treating Jeromy Borcherding/Extender: Deloria Lair in Treatment: 30 Clinic Level of Care Assessment Items TOOL 4 Quantity Score X- 1 0 Use when  only an EandM is performed on FOLLOW-UP visit ASSESSMENTS - Nursing Assessment / Reassessment X- 1 10 Reassessment of Co-morbidities (includes updates in patient status) X- 1 5 Reassessment of Adherence to Treatment Plan ASSESSMENTS - Wound and Skin A ssessment / Reassessment X - Simple Wound Assessment / Reassessment - one wound 1 5 _0  - 0 Complex Wound Assessment / Reassessment - multiple wounds _1  - 0 Dermatologic / Skin Assessment (not related to wound area) ASSESSMENTS - Focused Assessment _2  - 0 Circumferential Edema Measurements - multi extremities _3  - 0 Nutritional Assessment / Counseling / Intervention _4  - 0 Lower Extremity Assessment (monofilament, tuning fork, pulses) _5  - 0 Peripheral Arterial Disease Assessment (using hand held doppler) ASSESSMENTS - Ostomy and/or Continence Assessment and Care _6  - 0 Incontinence Assessment and Management _7  - 0 Ostomy Care Assessment and Management (repouching, etc.) PROCESS - Coordination of Care X - Simple Patient / Family Education for ongoing care 1 15 _8  - 0 Complex (extensive) Patient / Family Education for ongoing care X- 1 10 Staff obtains Programmer, systems, Records, T Results / Process Orders est X- 1 10 Staff telephones HHA, Nursing Homes / Clarify orders / etc _9  - 0 Routine Transfer to another Facility (non-emergent condition) _10  - 0 Routine Hospital Admission (non-emergent condition) _11  - 0 New Admissions / Biomedical engineer / Ordering NPWT Apligraf, etc. , _12  - 0 Emergency Hospital Admission (emergent condition) X- 1 10 Simple Discharge Coordination _13  - 0 Complex (extensive) Discharge Coordination PROCESS - Special Needs _14  - 0 Pediatric / Minor Patient Management _15  - 0 Isolation Patient Management _16  - 0 Hearing / Language / Visual special needs _17  - 0 Assessment of Community assistance (transportation, D/C planning, etc.) _18  - 0 Additional assistance / Altered mentation _19  - 0 Support  Surface(s) Assessment (bed, cushion, seat, etc.) INTERVENTIONS - Wound Cleansing / Measurement X - Simple Wound Cleansing - one wound 1 5 _20  - 0 Complex Wound Cleansing - multiple wounds  X- 1 5 Wound Imaging (photographs - any number of wounds) _0  - 0 Wound Tracing (instead of photographs) X- 1 5 Simple Wound Measurement - one wound _1  - 0 Complex Wound Measurement - multiple wounds INTERVENTIONS - Wound Dressings _2  - 0 Small Wound Dressing one or multiple wounds X- 1 15 Medium Wound Dressing one or multiple wounds _3  - 0 Large Wound Dressing one or multiple wounds <XNATFTDDUKGURKYH>_0<\/WCBJSEGBTDVVOHYW>_7  - 0 Application of Medications - topical <PXTGGYIRSWNIOEVO>_3<\/JKKXFGHWEXHBZJIR>_6  - 0 Application of Medications - injection INTERVENTIONS - Miscellaneous _6  - 0 External ear exam _7  - 0 Specimen Collection (cultures, biopsies, blood, body fluids, etc.) _8  - 0 Specimen(s) / Culture(s) sent or taken to Lab for analysis _9  - 0 Patient Transfer (multiple staff / Civil Service fast streamer / Similar devices) _10  - 0 Simple Staple / Suture removal (25 or less) _11  - 0 Complex Staple / Suture removal (26 or more) _12  - 0 Hypo / Hyperglycemic Management (close monitor of Blood Glucose) _13  - 0 Ankle / Brachial Index (ABI) - do not check if billed separately X- 1 5 Vital Signs Has the patient been seen at the hospital within the last three years: Yes Total Score: 100 Level Of Care: New/Established - Level 3 Electronic Signature(s) Signed: 05/05/2021 5:39:42 PM By: Levan Hurst RN, BSN Entered By: Levan Hurst on 05/05/2021 10:31:21 -------------------------------------------------------------------------------- Multi Wound Chart Details Patient Name: Date of Service: [name] 05/05/2021 9:30 A M Medical Record Number: [id number] Patient Account Number: [id number] Date of Birth/Sex: Treating RN: 11/10/1935 (85 y.o. [name], Lauren Primary Care Ura Hausen: [name] Other Clinician: Referring Nanie Dunkleberger: Treating Yadir Zentner/Extender: Deloria Lair in Treatment: 30 Vital Signs Height(in): 3 Pulse(bpm): 80 Weight(lbs): 110 Blood Pressure(mmHg): 187/99 Body Mass Index(BMI): 17 Temperature(F): 97.5 Respiratory Rate(breaths/min): 16 Photos: [N/A:N/A] Left Gluteus Right, Lateral Lower Leg N/A Wound Location: Pressure Injury Pressure Injury N/A Wounding Event: Pressure Ulcer Pressure Ulcer N/A Primary Etiology: Anemia, Deep Vein Thrombosis, Anemia, Deep Vein Thrombosis, N/A Comorbid History: Hypertension, Type II Diabetes, Hypertension, Type II Diabetes, Dementia Dementia 09/18/2020 12/16/2020 N/A Date Acquired: 30 19 N/A Weeks of Treatment: Open Open N/A Wound Status: 5x3.5x2.3 0x0x0 N/A Measurements L x W x D (cm) 13.744 0 N/A A (cm) : rea 31.612 0 N/A Volume (cm) : 67.00% 100.00% N/A % Reduction in A rea: -658.80% 100.00% N/A % Reduction in Volume: 8 Starting Position 1 (o'clock): 1 Ending Position 1 (o'clock): 2.8 Maximum Distance 1 (cm): Yes No N/A Undermining: Category/Stage III Category/Stage III N/A Classification: Medium None Present N/A Exudate A mount: Serosanguineous N/A N/A Exudate Type: red, brown N/A N/A Exudate Color: Epibole Well defined, not attached N/A Wound Margin: Large (67-100%) None Present (0%) N/A Granulation A mount: Red, Pink N/A N/A Granulation Quality: Small (1-33%) None Present (0%) N/A Necrotic A mount: Fat Layer (Subcutaneous Tissue): Yes Fascia: No N/A Exposed Structures: Fascia: No Fat Layer (Subcutaneous Tissue): No Tendon: No Tendon: No Muscle: No Muscle: No Joint: No Joint: No Bone: No Bone: No Medium (34-66%) Large (67-100%) N/A Epithelialization: Treatment Notes Electronic Signature(s) Signed: 05/05/2021 3:46:55 PM By: Linton Ham MD Signed: 05/05/2021 5:09:47 PM By: Rhae Hammock RN Entered By: Linton Ham on 05/05/2021  10:40:21 -------------------------------------------------------------------------------- Multi-Disciplinary Care Plan Details Patient Name: Date of Service: [place] 05/05/2021 9:30 A M Medical Record Number: [id number] Patient Account Number: [id number] Date of Birth/Sex: Treating RN: 17-Aug-1935 (85 y.o. [name] Primary Care Kushal Saunders: [name] Other Clinician: Referring Floyde Dingley: Treating Ailis Rigaud/Extender: Deloria Lair in  Treatment: 30 Active Inactive Wound/Skin Impairment Nursing Diagnoses: Impaired tissue integrity Goals: Patient/caregiver will verbalize understanding of skin care regimen Date Initiated: 10/02/2020 Target Resolution Date: 06/04/2021 Goal Status: Active Ulcer/skin breakdown will have a volume reduction of 30% by week 4 Date Initiated: 10/02/2020 Date Inactivated: 01/06/2021 Target Resolution Date: 01/09/2021 Goal Status: Met Interventions: Assess patient/caregiver ability to obtain necessary supplies Assess patient/caregiver ability to perform ulcer/skin care regimen upon admission and as needed Assess ulceration(s) every visit Provide education on ulcer and skin care Treatment Activities: Skin care regimen initiated : 10/02/2020 Topical wound management initiated : 10/02/2020 Notes: 11/10/20 : Goal not yet met, eschar debrided off increasing wound size. Electronic Signature(s) Signed: 05/05/2021 5:39:42 PM By: Levan Hurst RN, BSN Entered By: Levan Hurst on 05/05/2021 10:24:10 -------------------------------------------------------------------------------- Pain Assessment Details Patient Name: Date of Service: [name], [name] 05/05/2021 9:30 A M Medical Record Number: [id number] Patient Account Number: [id number] Date of Birth/Sex: Treating RN: 1935/10/06 (85 y.o. [name] Primary Care Ameyah Bangura: [name] Other Clinician: Referring Meriam Chojnowski: Treating Eino Whitner/Extender: Deloria Lair in Treatment: 30 Active Problems Location of Pain Severity and Description of Pain Patient Has Paino No Site Locations Pain Management and Medication Current Pain Management: Electronic Signature(s) Signed: 05/05/2021 5:39:42 PM By: Levan Hurst RN, BSN Entered By: Levan Hurst on 05/05/2021 10:02:16 -------------------------------------------------------------------------------- Patient/Caregiver Education Details Patient Name: Date of Service: [name] 12/28/2022andnbsp9:30 [place] Record Number: [id number] Patient Account Number: [id number] Date of Birth/Gender: Treating RN: 1936-03-12 (85 y.o. [name] Primary Care Physician: [name] Other Clinician: Referring Physician: Treating Physician/Extender: Deloria Lair in Treatment: 30 Education Assessment Education Provided To: Patient Education Topics Provided Wound/Skin Impairment: Methods: Explain/Verbal Responses: State content correctly [employer]) Signed: 05/05/2021 5:39:42 PM By: Levan Hurst RN, BSN Entered By: Levan Hurst on 05/05/2021 10:24:21 -------------------------------------------------------------------------------- Wound Assessment Details Patient Name: Date of Service: [name], [name] 05/05/2021 9:30 A M Medical Record Number: [id number] Patient Account Number: [id number] Date of Birth/Sex: Treating RN: 1935/05/11 (85 y.o. [name] Primary Care Toan Mort: [name] Other Clinician: Referring Astha Probasco: Treating Tavyn Kurka/Extender: Deloria Lair in Treatment: 30 Wound Status Wound Number: 2 Primary Pressure Ulcer Etiology: Wound Location: Left Gluteus Wound Status: Open Wounding Event: Pressure Injury Comorbid Anemia, Deep Vein Thrombosis, Hypertension, Type II Date Acquired: 09/18/2020 History: Diabetes, Dementia Weeks Of Treatment: 30 Clustered Wound:  No Photos Wound Measurements Length: (cm) 5 Width: (cm) 3.5 Depth: (cm) 2.3 Area: (cm) 13.744 Volume: (cm) 31.612 % Reduction in Area: 67% % Reduction in Volume: -658.8% Epithelialization: Medium (34-66%) Tunneling: No Undermining: Yes Starting Position (o'clock): 8 Ending Position (o'clock): 1 Maximum Distance: (cm) 2.8 Wound Description Classification: Category/Stage III Wound Margin: Epibole Exudate Amount: Medium Exudate Type: Serosanguineous Exudate Color: red, brown Foul Odor After Cleansing: No Slough/Fibrino Yes Wound Bed Granulation Amount: Large (67-100%) Exposed Structure Granulation Quality: Red, Pink Fascia Exposed: No Necrotic Amount: Small (1-33%) Fat Layer (Subcutaneous Tissue) Exposed: Yes Necrotic Quality: Adherent Slough Tendon Exposed: No Muscle Exposed: No Joint Exposed: No Bone Exposed: No Electronic Signature(s) Signed: 05/05/2021 5:39:42 PM By: Levan Hurst RN, BSN Entered By: Levan Hurst on 05/05/2021 10:04:24 -------------------------------------------------------------------------------- Wound Assessment Details Patient Name: Date of Service: [name], [name] 05/05/2021 9:30 A M Medical Record Number: [id number] Patient Account Number: [id number] Date of Birth/Sex: Treating RN: Mar 23, 1936 (85 y.o. [name] Primary Care Aleathea Pugmire: [name] Other Clinician: Referring Karlynn Furrow: Treating Ovida Delagarza/Extender: Deloria Lair in Treatment: 30 Wound Status Wound  Number: 3 Primary Pressure Ulcer Etiology: Wound Location: Right, Lateral Lower Leg Wound Status: Open Wounding Event: Pressure Injury Comorbid Anemia, Deep Vein Thrombosis, Hypertension, Type II Date Acquired: 12/16/2020 History: Diabetes, Dementia Weeks Of Treatment: 19 Clustered Wound: No Photos Wound Measurements Length: (cm) Width: (cm) Depth: (cm) Area: (cm) Volume: (cm) 0 % Reduction in Area: 100% 0 % Reduction in  Volume: 100% 0 Epithelialization: Large (67-100%) 0 Tunneling: No 0 Undermining: No Wound Description Classification: Category/Stage III Wound Margin: Well defined, not attached Exudate Amount: None Present Foul Odor After Cleansing: No Slough/Fibrino No Wound Bed Granulation Amount: None Present (0%) Exposed Structure Necrotic Amount: None Present (0%) Fascia Exposed: No Fat Layer (Subcutaneous Tissue) Exposed: No Tendon Exposed: No Muscle Exposed: No Joint Exposed: No Bone Exposed: No Electronic Signature(s) Signed: 05/05/2021 5:39:42 PM By: Levan Hurst RN, BSN Entered By: Levan Hurst on 05/05/2021 10:05:33 -------------------------------------------------------------------------------- [place] Details Patient Name: Date of Service: [name] 05/05/2021 9:30 [place] Record Number: [id number] Patient Account Number: [id number] Date of Birth/Sex: Treating RN: 01-18-1936 (85 y.o. [name] Primary Care Mac Dowdell: [name] Other Clinician: Referring Roxi Hlavaty: Treating Cote Mayabb/Extender: Deloria Lair in Treatment: 30 Vital Signs Time Taken: 09:49 Temperature (F): 97.5 Height (in): 68 Pulse (bpm): 80 Weight (lbs): 110 Respiratory Rate (breaths/min): 16 Body Mass Index (BMI): 16.7 Blood Pressure (mmHg): 187/99 Reference Range: 80 - 120 mg / dl Electronic Signature(s) Signed: 05/05/2021 5:39:42 PM By: Levan Hurst RN, BSN Entered By: Levan Hurst on 05/05/2021 09:51:54

## 2021-05-06 ENCOUNTER — Telehealth: Payer: Self-pay | Admitting: *Deleted

## 2021-05-06 NOTE — Telephone Encounter (Signed)
Received [employer] Form from [employer], Claims Administrator [phone]  Needing the Attending 47 Statement filled out and faxed back.   Placed in [place] folder to review and sign.

## 2021-05-07 DIAGNOSIS — E119 Type 2 diabetes mellitus without complications: Secondary | ICD-10-CM | POA: Diagnosis not present

## 2021-05-07 DIAGNOSIS — L89320 Pressure ulcer of left buttock, unstageable: Secondary | ICD-10-CM | POA: Diagnosis not present

## 2021-05-07 DIAGNOSIS — I890 Lymphedema, not elsewhere classified: Secondary | ICD-10-CM | POA: Diagnosis not present

## 2021-05-07 DIAGNOSIS — E785 Hyperlipidemia, unspecified: Secondary | ICD-10-CM | POA: Diagnosis not present

## 2021-05-07 DIAGNOSIS — F0390 Unspecified dementia without behavioral disturbance: Secondary | ICD-10-CM | POA: Diagnosis not present

## 2021-05-07 DIAGNOSIS — L89893 Pressure ulcer of other site, stage 3: Secondary | ICD-10-CM | POA: Diagnosis not present

## 2021-05-13 DIAGNOSIS — L89893 Pressure ulcer of other site, stage 3: Secondary | ICD-10-CM | POA: Diagnosis not present

## 2021-05-13 DIAGNOSIS — F0390 Unspecified dementia without behavioral disturbance: Secondary | ICD-10-CM | POA: Diagnosis not present

## 2021-05-13 DIAGNOSIS — E119 Type 2 diabetes mellitus without complications: Secondary | ICD-10-CM | POA: Diagnosis not present

## 2021-05-13 DIAGNOSIS — L89320 Pressure ulcer of left buttock, unstageable: Secondary | ICD-10-CM | POA: Diagnosis not present

## 2021-05-13 DIAGNOSIS — I890 Lymphedema, not elsewhere classified: Secondary | ICD-10-CM | POA: Diagnosis not present

## 2021-05-13 DIAGNOSIS — E785 Hyperlipidemia, unspecified: Secondary | ICD-10-CM | POA: Diagnosis not present

## 2021-05-17 DIAGNOSIS — F0390 Unspecified dementia without behavioral disturbance: Secondary | ICD-10-CM | POA: Diagnosis not present

## 2021-05-17 DIAGNOSIS — I890 Lymphedema, not elsewhere classified: Secondary | ICD-10-CM | POA: Diagnosis not present

## 2021-05-17 DIAGNOSIS — L89893 Pressure ulcer of other site, stage 3: Secondary | ICD-10-CM | POA: Diagnosis not present

## 2021-05-17 DIAGNOSIS — E785 Hyperlipidemia, unspecified: Secondary | ICD-10-CM | POA: Diagnosis not present

## 2021-05-17 DIAGNOSIS — E119 Type 2 diabetes mellitus without complications: Secondary | ICD-10-CM | POA: Diagnosis not present

## 2021-05-17 DIAGNOSIS — L89320 Pressure ulcer of left buttock, unstageable: Secondary | ICD-10-CM | POA: Diagnosis not present

## 2021-05-19 NOTE — Telephone Encounter (Signed)
Paperwork faxed to [employer].  Sent to [place].

## 2021-05-26 DIAGNOSIS — E119 Type 2 diabetes mellitus without complications: Secondary | ICD-10-CM | POA: Diagnosis not present

## 2021-05-26 DIAGNOSIS — E785 Hyperlipidemia, unspecified: Secondary | ICD-10-CM | POA: Diagnosis not present

## 2021-05-26 DIAGNOSIS — F0390 Unspecified dementia without behavioral disturbance: Secondary | ICD-10-CM | POA: Diagnosis not present

## 2021-05-26 DIAGNOSIS — L89893 Pressure ulcer of other site, stage 3: Secondary | ICD-10-CM | POA: Diagnosis not present

## 2021-05-26 DIAGNOSIS — I890 Lymphedema, not elsewhere classified: Secondary | ICD-10-CM | POA: Diagnosis not present

## 2021-05-26 DIAGNOSIS — L89320 Pressure ulcer of left buttock, unstageable: Secondary | ICD-10-CM | POA: Diagnosis not present

## 2021-05-27 DIAGNOSIS — Z741 Need for assistance with personal care: Secondary | ICD-10-CM | POA: Diagnosis not present

## 2021-05-27 DIAGNOSIS — M6281 Muscle weakness (generalized): Secondary | ICD-10-CM

## 2021-05-27 DIAGNOSIS — E785 Hyperlipidemia, unspecified: Secondary | ICD-10-CM

## 2021-05-27 DIAGNOSIS — Z8673 Personal history of transient ischemic attack (TIA), and cerebral infarction without residual deficits: Secondary | ICD-10-CM | POA: Diagnosis not present

## 2021-05-27 DIAGNOSIS — L89893 Pressure ulcer of other site, stage 3: Secondary | ICD-10-CM

## 2021-05-27 DIAGNOSIS — I10 Essential (primary) hypertension: Secondary | ICD-10-CM

## 2021-05-27 DIAGNOSIS — L89320 Pressure ulcer of left buttock, unstageable: Secondary | ICD-10-CM

## 2021-05-27 DIAGNOSIS — E1140 Type 2 diabetes mellitus with diabetic neuropathy, unspecified: Secondary | ICD-10-CM

## 2021-05-27 DIAGNOSIS — Z7984 Long term (current) use of oral hypoglycemic drugs: Secondary | ICD-10-CM | POA: Diagnosis not present

## 2021-05-27 DIAGNOSIS — Z86711 Personal history of pulmonary embolism: Secondary | ICD-10-CM | POA: Diagnosis not present

## 2021-05-27 DIAGNOSIS — F0390 Unspecified dementia without behavioral disturbance: Secondary | ICD-10-CM

## 2021-05-27 DIAGNOSIS — Z86718 Personal history of other venous thrombosis and embolism: Secondary | ICD-10-CM | POA: Diagnosis not present

## 2021-05-27 DIAGNOSIS — Z7901 Long term (current) use of anticoagulants: Secondary | ICD-10-CM | POA: Diagnosis not present

## 2021-05-27 DIAGNOSIS — I890 Lymphedema, not elsewhere classified: Secondary | ICD-10-CM

## 2021-05-27 DIAGNOSIS — K5900 Constipation, unspecified: Secondary | ICD-10-CM | POA: Diagnosis not present

## 2021-05-31 ENCOUNTER — Other Ambulatory Visit: Payer: Self-pay | Admitting: *Deleted

## 2021-05-31 ENCOUNTER — Other Ambulatory Visit: Payer: Self-pay | Admitting: Nurse Practitioner

## 2021-05-31 DIAGNOSIS — E1142 Type 2 diabetes mellitus with diabetic polyneuropathy: Secondary | ICD-10-CM

## 2021-05-31 MED ORDER — GABAPENTIN 300 MG PO CAPS: 300.0000 mg | ORAL_CAPSULE | Freq: Two times a day (BID) | ORAL | 0 refills | Status: DC

## 2021-05-31 NOTE — Telephone Encounter (Signed)
Pharmacy requested refill

## 2021-05-31 NOTE — Telephone Encounter (Signed)
[name], Caregiver called and stated that patient is out of her medication. Stated that [facility] is sending a Rx but it will be a couple of days before they receive it and she is wanting a Rx sent to local pharmacy to get her through until she receives mail order.

## 2021-06-02 ENCOUNTER — Encounter (HOSPITAL_BASED_OUTPATIENT_CLINIC_OR_DEPARTMENT_OTHER): Payer: Medicare Other | Attending: Internal Medicine | Admitting: Internal Medicine

## 2021-06-02 ENCOUNTER — Other Ambulatory Visit: Payer: Self-pay

## 2021-06-02 DIAGNOSIS — L89324 Pressure ulcer of left buttock, stage 4: Secondary | ICD-10-CM | POA: Diagnosis not present

## 2021-06-02 DIAGNOSIS — F0390 Unspecified dementia without behavioral disturbance: Secondary | ICD-10-CM | POA: Insufficient documentation

## 2021-06-02 DIAGNOSIS — E119 Type 2 diabetes mellitus without complications: Secondary | ICD-10-CM | POA: Insufficient documentation

## 2021-06-03 ENCOUNTER — Encounter: Payer: Self-pay | Admitting: Family Medicine

## 2021-06-03 ENCOUNTER — Other Ambulatory Visit: Payer: Medicare Other | Admitting: Family Medicine

## 2021-06-03 VITALS — BP 140/76 | HR 84 | Resp 18

## 2021-06-03 DIAGNOSIS — R1310 Dysphagia, unspecified: Secondary | ICD-10-CM

## 2021-06-03 DIAGNOSIS — L89320 Pressure ulcer of left buttock, unstageable: Secondary | ICD-10-CM | POA: Diagnosis not present

## 2021-06-03 DIAGNOSIS — L98422 Non-pressure chronic ulcer of back with fat layer exposed: Secondary | ICD-10-CM | POA: Diagnosis not present

## 2021-06-03 DIAGNOSIS — E1142 Type 2 diabetes mellitus with diabetic polyneuropathy: Secondary | ICD-10-CM

## 2021-06-03 DIAGNOSIS — E43 Unspecified severe protein-calorie malnutrition: Secondary | ICD-10-CM

## 2021-06-03 DIAGNOSIS — F0390 Unspecified dementia without behavioral disturbance: Secondary | ICD-10-CM

## 2021-06-03 DIAGNOSIS — E785 Hyperlipidemia, unspecified: Secondary | ICD-10-CM | POA: Diagnosis not present

## 2021-06-03 DIAGNOSIS — E1140 Type 2 diabetes mellitus with diabetic neuropathy, unspecified: Secondary | ICD-10-CM | POA: Diagnosis not present

## 2021-06-03 DIAGNOSIS — Z515 Encounter for palliative care: Secondary | ICD-10-CM

## 2021-06-03 DIAGNOSIS — L89893 Pressure ulcer of other site, stage 3: Secondary | ICD-10-CM | POA: Diagnosis not present

## 2021-06-03 DIAGNOSIS — I890 Lymphedema, not elsewhere classified: Secondary | ICD-10-CM | POA: Diagnosis not present

## 2021-06-03 NOTE — Progress Notes (Signed)
[name] ([id number]) , Visit Report for 06/02/2021 Arrival Information Details Patient Name: Date of Service: [name], [name] 06/02/2021 9:45 A M Medical Record Number: [id number] Patient Account Number: [id number] Date of Birth/Sex: Treating RN: 1936-04-17 (86 y.o. [name], Lauren Primary Care Tavarion Babington: [name] Other Clinician: Referring Lynessa Almanzar: Treating Danie Diehl/Extender: Deloria Lair in Treatment: 51 Visit Information History Since Last Visit Added or deleted any medications: No Patient Arrived: Wheel Chair Any new allergies or adverse reactions: No Arrival Time: 10:30 Had a fall or experienced change in No Accompanied By: daughter activities of daily living that may affect Transfer Assistance: Manual risk of falls: Patient Identification Verified: Yes Signs or symptoms of abuse/neglect since last visito No Secondary Verification Process Completed: Yes Hospitalized since last visit: No Patient Requires Transmission-Based Precautions: No Implantable device outside of the clinic excluding No Patient Has Alerts: No cellular tissue based products placed in the center since last visit: Has Dressing in Place as Prescribed: Yes Pain Present Now: No Electronic Signature(s) Signed: 06/03/2021 5:37:29 PM By: Rhae Hammock RN Entered By: Rhae Hammock on 06/02/2021 10:30:35 -------------------------------------------------------------------------------- Clinic Level of Care Assessment Details Patient Name: Date of Service: [name], [name] 06/02/2021 9:45 A M Medical Record Number: [id number] Patient Account Number: [id number] Date of Birth/Sex: Treating RN: 21-Nov-1935 (86 y.o. [name], Lauren Primary Care Satish Hammers: [name] Other Clinician: Referring Ramonda Galyon: Treating Okechukwu Regnier/Extender: Deloria Lair in Treatment: 30 Clinic Level of Care Assessment Items TOOL 4 Quantity Score X- 1 0 Use  when only an EandM is performed on FOLLOW-UP visit ASSESSMENTS - Nursing Assessment / Reassessment X- 1 10 Reassessment of Co-morbidities (includes updates in patient status) X- 1 5 Reassessment of Adherence to Treatment Plan ASSESSMENTS - Wound and Skin A ssessment / Reassessment X - Simple Wound Assessment / Reassessment - one wound 1 5 _0  - 0 Complex Wound Assessment / Reassessment - multiple wounds _1  - 0 Dermatologic / Skin Assessment (not related to wound area) ASSESSMENTS - Focused Assessment _2  - 0 Circumferential Edema Measurements - multi extremities _3  - 0 Nutritional Assessment / Counseling / Intervention _4  - 0 Lower Extremity Assessment (monofilament, tuning fork, pulses) _5  - 0 Peripheral Arterial Disease Assessment (using hand held doppler) ASSESSMENTS - Ostomy and/or Continence Assessment and Care _6  - 0 Incontinence Assessment and Management _7  - 0 Ostomy Care Assessment and Management (repouching, etc.) PROCESS - Coordination of Care X - Simple Patient / Family Education for ongoing care 1 15 _8  - 0 Complex (extensive) Patient / Family Education for ongoing care X- 1 10 Staff obtains Programmer, systems, Records, T Results / Process Orders est X- 1 10 Staff telephones HHA, Nursing Homes / Clarify orders / etc _9  - 0 Routine Transfer to another Facility (non-emergent condition) _10  - 0 Routine Hospital Admission (non-emergent condition) _11  - 0 New Admissions / Biomedical engineer / Ordering NPWT Apligraf, etc. , _12  - 0 Emergency Hospital Admission (emergent condition) X- 1 10 Simple Discharge Coordination _13  - 0 Complex (extensive) Discharge Coordination PROCESS - Special Needs _14  - 0 Pediatric / Minor Patient Management _15  - 0 Isolation Patient Management _16  - 0 Hearing / Language / Visual special needs _17  - 0 Assessment of Community assistance (transportation, D/C planning, etc.) _18  - 0 Additional assistance / Altered mentation _19  - 0 Support  Surface(s) Assessment (bed, cushion, seat, etc.) INTERVENTIONS - Wound Cleansing / Measurement X - Simple Wound Cleansing - one wound 1 5 _20  - 0 Complex Wound Cleansing - multiple wounds  X- 1 5 Wound Imaging (photographs - any number of wounds) _0  - 0 Wound Tracing (instead of photographs) X- 1 5 Simple Wound Measurement - one wound _1  - 0 Complex Wound Measurement - multiple wounds INTERVENTIONS - Wound Dressings X - Small Wound Dressing one or multiple wounds 1 10 _2  - 0 Medium Wound Dressing one or multiple wounds _3  - 0 Large Wound Dressing one or multiple wounds X- 1 5 Application of Medications - topical <[id number]>_1<\/[id number]>_4  - 0 Application of Medications - injection INTERVENTIONS - Miscellaneous _5  - 0 External ear exam _6  - 0 Specimen Collection (cultures, biopsies, blood, body fluids, etc.) _7  - 0 Specimen(s) / Culture(s) sent or taken to Lab for analysis _8  - 0 Patient Transfer (multiple staff / Civil Service fast streamer / Similar devices) _9  - 0 Simple Staple / Suture removal (25 or less) _10  - 0 Complex Staple / Suture removal (26 or more) _11  - 0 Hypo / Hyperglycemic Management (close monitor of Blood Glucose) _12  - 0 Ankle / Brachial Index (ABI) - do not check if billed separately X- 1 5 Vital Signs Has the patient been seen at the hospital within the last three years: Yes Total Score: 100 Level Of Care: New/Established - Level 3 Electronic Signature(s) Signed: 06/03/2021 5:37:29 PM By: Rhae Hammock RN Entered By: Rhae Hammock on 06/02/2021 11:01:43 -------------------------------------------------------------------------------- Encounter Discharge Information Details Patient Name: Date of Service: [name] 06/02/2021 9:45 A M Medical Record Number: [id number] Patient Account Number: [id number] Date of Birth/Sex: Treating RN: April 20, 1936 (86 y.o. [name], Lauren Primary Care Jeniya Flannigan: [name] Other Clinician: Referring Edgel Degnan: Treating Yoshiye Kraft/Extender:  Deloria Lair in Treatment: 34 Encounter Discharge Information Items Discharge Condition: Stable Ambulatory Status: Wheelchair Discharge Destination: Home Transportation: Private Auto Accompanied By: Mady Gemma Schedule Follow-up Appointment: Yes Clinical Summary of Care: Patient Declined Electronic Signature(s) Signed: 06/03/2021 5:37:29 PM By: Rhae Hammock RN Entered By: Rhae Hammock on 06/02/2021 11:04:24 -------------------------------------------------------------------------------- Lower Extremity Assessment Details Patient Name: Date of Service: [name], [name] 06/02/2021 9:45 A M Medical Record Number: [id number] Patient Account Number: [id number] Date of Birth/Sex: Treating RN: 28-Apr-1936 (85 y.o. [name], Lauren Primary Care Yuliana Vandrunen: [name] Other Clinician: Referring Danaka Llera: Treating Mcdonald Reiling/Extender: Deloria Lair in Treatment: 34 Electronic Signature(s) Signed: 06/03/2021 5:37:29 PM By: Rhae Hammock RN Entered By: Rhae Hammock on 06/02/2021 10:26:06 -------------------------------------------------------------------------------- Multi Wound Chart Details Patient Name: Date of Service: [name], [name] 06/02/2021 9:45 A M Medical Record Number: [id number] Patient Account Number: [id number] Date of Birth/Sex: Treating RN: 28-Jul-1935 (86 y.o. [name], Lauren Primary Care Yovana Scogin: [name] Other Clinician: Referring Moe Brier: Treating Brelynn Wheller/Extender: Deloria Lair in Treatment: 34 Vital Signs Height(in): 68 Pulse(bpm): 35 Weight(lbs): 110 Blood Pressure(mmHg): 202/72 Body Mass Index(BMI): 16.7 Temperature(F): 98.7 Respiratory Rate(breaths/min): 17 Photos: [N/A:N/A] Left Gluteus N/A N/A Wound Location: Pressure Injury N/A N/A Wounding Event: Pressure Ulcer N/A N/A Primary Etiology: Anemia, Deep Vein Thrombosis, N/A N/A Comorbid  History: Hypertension, Type II Diabetes, Dementia 09/18/2020 N/A N/A Date Acquired: 53 N/A N/A Weeks of Treatment: Open N/A N/A Wound Status: No N/A N/A Wound Recurrence: 6x4x2.7 N/A N/A Measurements L x W x D (cm) 18.85 N/A N/A A (cm) : rea 50.894 N/A N/A Volume (cm) : 54.80% N/A N/A % Reduction in A rea: -1121.70% N/A N/A % Reduction in Volume: 7 Starting Position 1 (o'clock): 4 Ending Position 1 (o'clock): 3.5 Maximum Distance 1 (cm): Yes N/A N/A Undermining: Category/Stage III N/A N/A Classification: Medium N/A N/A Exudate A  mount: Serosanguineous N/A N/A Exudate Type: red, brown N/A N/A Exudate Color: Epibole N/A N/A Wound Margin: Medium (34-66%) N/A N/A Granulation A mount: Red, Pink N/A N/A Granulation Quality: Medium (34-66%) N/A N/A Necrotic A mount: Fat Layer (Subcutaneous Tissue): Yes N/A N/A Exposed Structures: Fascia: No Tendon: No Muscle: No Joint: No Bone: No Medium (34-66%) N/A N/A Epithelialization: Treatment Notes Wound #2 (Gluteus) Wound Laterality: Left Cleanser Soap and Water Discharge Instruction: May shower and wash wound with dial antibacterial soap and water prior to dressing change. Peri-Wound Care Topical Primary Dressing KerraCel Ag Gelling Fiber Dressing, 4x5 in (silver alginate) Discharge Instruction: Apply silver alginate to wound bed as instructed Secondary Dressing Woven Gauze Sponge, Non-Sterile 4x4 in Discharge Instruction: Dry gauze backing over alginate ABD Pad, 8x10 Discharge Instruction: Apply over primary dressing as directed. Secured With 68M Medipore H Soft Cloth Surgical T 4 x 2 (in/yd) ape Discharge Instruction: Secure dressing with tape as directed. Compression Wrap Compression Stockings Add-Ons Electronic Signature(s) Signed: 06/02/2021 4:28:28 PM By: Linton Ham MD Signed: 06/03/2021 5:37:29 PM By: Rhae Hammock RN Entered By: Linton Ham on 06/02/2021  10:48:21 -------------------------------------------------------------------------------- Multi-Disciplinary Care Plan Details Patient Name: Date of Service: [place] 06/02/2021 9:45 A M Medical Record Number: [id number] Patient Account Number: [id number] Date of Birth/Sex: Treating RN: 1935/05/24 (86 y.o. [name], Lauren Primary Care Kason Benak: [name] Other Clinician: Referring Damier Disano: Treating Tyna Huertas/Extender: Deloria Lair in Treatment: 34 Active Inactive Wound/Skin Impairment Nursing Diagnoses: Impaired tissue integrity Goals: Patient/caregiver will verbalize understanding of skin care regimen Date Initiated: 10/02/2020 Target Resolution Date: 06/12/2021 Goal Status: Active Ulcer/skin breakdown will have a volume reduction of 30% by week 4 Date Initiated: 10/02/2020 Date Inactivated: 01/06/2021 Target Resolution Date: 01/09/2021 Goal Status: Met Interventions: Assess patient/caregiver ability to obtain necessary supplies Assess patient/caregiver ability to perform ulcer/skin care regimen upon admission and as needed Assess ulceration(s) every visit Provide education on ulcer and skin care Treatment Activities: Skin care regimen initiated : 10/02/2020 Topical wound management initiated : 10/02/2020 Notes: 11/10/20 : Goal not yet met, eschar debrided off increasing wound size. Electronic Signature(s) Signed: 06/03/2021 5:37:29 PM By: Rhae Hammock RN Entered By: Rhae Hammock on 06/02/2021 10:37:59 -------------------------------------------------------------------------------- Pain Assessment Details Patient Name: Date of Service: [name], [name] 06/02/2021 9:45 A M Medical Record Number: [id number] Patient Account Number: [id number] Date of Birth/Sex: Treating RN: 1936-04-03 (86 y.o. [name], Lauren Primary Care Mekesha Solomon: [name] Other Clinician: Referring Shay Bartoli: Treating Abhimanyu Cruces/Extender: Deloria Lair in Treatment: 34 Active Problems Location of Pain Severity and Description of Pain Patient Has Paino No Site Locations Pain Management and Medication Current Pain Management: Electronic Signature(s) Signed: 06/03/2021 5:37:29 PM By: Rhae Hammock RN Entered By: Rhae Hammock on 06/02/2021 10:25:58 -------------------------------------------------------------------------------- Patient/Caregiver Education Details Patient Name: Date of Service: [name] 1/25/2023andnbsp9:45 [place] Record Number: [id number] Patient Account Number: [id number] Date of Birth/Gender: Treating RN: 1935-06-01 (86 y.o. [name] Primary Care Physician: [name] Other Clinician: Referring Physician: Treating Physician/Extender: Deloria Lair in Treatment: 22 Education Assessment Education Provided To: Patient Education Topics Provided Wound/Skin Impairment: Methods: Explain/Verbal Responses: State content correctly [employer]) Signed: 06/03/2021 5:37:29 PM By: Rhae Hammock RN Entered By: Rhae Hammock on 06/02/2021 10:38:49 -------------------------------------------------------------------------------- Wound Assessment Details Patient Name: Date of Service: [name], [name] 06/02/2021 9:45 A M Medical Record Number: [id number] Patient Account Number: [id number] Date of Birth/Sex: Treating RN: [birth year] (86 y.o. [name], Lauren Primary Care Ladislav Caselli: [name] Other  Clinician: Referring Cavon Nicolls: Treating Pranavi Aure/Extender: Deloria Lair in Treatment: 34 Wound Status Wound Number: 2 Primary Pressure Ulcer Etiology: Wound Location: Left Gluteus Wound Status: Open Wounding Event: Pressure Injury Comorbid Anemia, Deep Vein Thrombosis, Hypertension, Type II Date Acquired: 09/18/2020 History: Diabetes, Dementia Weeks Of Treatment: 34 Clustered  Wound: No Photos Wound Measurements Length: (cm) 6 Width: (cm) 4 Depth: (cm) 2.7 Area: (cm) 18.85 Volume: (cm) 50.894 % Reduction in Area: 54.8% % Reduction in Volume: -1121.7% Epithelialization: Medium (34-66%) Tunneling: No Undermining: Yes Starting Position (o'clock): 7 Ending Position (o'clock): 4 Maximum Distance: (cm) 3.5 Wound Description Classification: Category/Stage III Wound Margin: Epibole Exudate Amount: Medium Exudate Type: Serosanguineous Exudate Color: red, brown Foul Odor After Cleansing: No Slough/Fibrino Yes Wound Bed Granulation Amount: Medium (34-66%) Exposed Structure Granulation Quality: Red, Pink Fascia Exposed: No Necrotic Amount: Medium (34-66%) Fat Layer (Subcutaneous Tissue) Exposed: Yes Necrotic Quality: Adherent Slough Tendon Exposed: No Muscle Exposed: No Joint Exposed: No Bone Exposed: No Treatment Notes Wound #2 (Gluteus) Wound Laterality: Left Cleanser Soap and Water Discharge Instruction: May shower and wash wound with dial antibacterial soap and water prior to dressing change. Peri-Wound Care Topical Primary Dressing KerraCel Ag Gelling Fiber Dressing, 4x5 in (silver alginate) Discharge Instruction: Apply silver alginate to wound bed as instructed Secondary Dressing Woven Gauze Sponge, Non-Sterile 4x4 in Discharge Instruction: Dry gauze backing over alginate ABD Pad, 8x10 Discharge Instruction: Apply over primary dressing as directed. Secured With 34M Medipore H Soft Cloth Surgical T 4 x 2 (in/yd) ape Discharge Instruction: Secure dressing with tape as directed. Compression Wrap Compression Stockings Add-Ons Electronic Signature(s) Signed: 06/03/2021 5:37:29 PM By: Rhae Hammock RN Entered By: Rhae Hammock on 06/02/2021 10:33:29 -------------------------------------------------------------------------------- Vitals Details Patient Name: Date of Service: [name] 06/02/2021 9:45 A M Medical Record  Number: [id number] Patient Account Number: [id number] Date of Birth/Sex: Treating RN: 09-Dec-1935 (86 y.o. [name], Lauren Primary Care Reign Dziuba: [name] Other Clinician: Referring Anjalee Cope: Treating Shameer Molstad/Extender: Deloria Lair in Treatment: 34 Vital Signs Time Taken: 10:25 Temperature (F): 98.7 Height (in): 68 Pulse (bpm): 74 Weight (lbs): 110 Respiratory Rate (breaths/min): 17 Body Mass Index (BMI): 16.7 Blood Pressure (mmHg): 202/72 Reference Range: 80 - 120 mg / dl Electronic Signature(s) Signed: 06/03/2021 5:37:29 PM By: Rhae Hammock RN Entered By: Rhae Hammock on 06/02/2021 10:25:51

## 2021-06-03 NOTE — Progress Notes (Signed)
[facility] Palliative Care Consult Note Telephone: [phone]  Fax: [phone]    Date of encounter: 06/03/21 2:20 PM  PATIENT NAME: [name] [address] [place] [address]   [id number] (home)  DOB: [birth year] MRN: [id number] PRIMARY CARE PROVIDER:    [name], NP,  [place] [phone]  REFERRING PROVIDER:   [name], NP [place],  [place] [address] [phone]  RESPONSIBLE PARTY:    Contact Information     Name Relation Home Work Mobile   Valatie Relative [phone]  [phone]   [name], [name] [phone]          I met face to face with patient and daughter-in-law in their home. Palliative Care was asked to follow this patient by consultation request of  [name], NP to address advance care planning and complex medical decision making. This is a follow up visit.                                   ASSESSMENT, SYMPTOM MANAGEMENT AND PLAN / RECOMMENDATIONS:  Palliative Care Encounter- Has DNR and MOST on file. [name], son is [facility] POA. Sacral decubitus with fat layer exposed-Continue monthly visit to wound care. Continue optimal glucose management.  Do not sit for greater than 1 hour and change positions frequently to offload pressure. Continue home care for wound care surveillance.  Using Calcium Alginate to pack wound currently. Continue to supplement protein for wound healing. Severe protein calorie malnutrition-Last Albumin 09/10/20 was 2.9.  Supplement with Protein (ensure, Boost, glucerna) TID Swallowing impairment-Agree with pureed foods, sitting up for meals and for 30 minutes beyond. Dementia without behavioral disturbance-FAST 7C. If no longer goes to wound care and decreases intake would qualify for Hospice level of care. Diabetes without long term insulin use-Last HGB A1c 7.0% is good range to avoid hypoglycemia with  possible poor intake.  Continues on Metformin BID with blood sugar checks fasting and before dinner.  Advance Care Planning/Goals of Care: Goals include to maximize quality of life and symptom management. Patient/health care surrogate gave his/her permission to discuss and remain at home.  CODE STATUS:  DNR  MOST as of 08/23/2019: DNR/DNI Limited Additional interventions Antibiotics on case by case basis IV fluids on time limited trial basis No feeding tube    Follow up Palliative Care Visit: Palliative care will continue to follow for complex medical decision making, advance care planning, and clarification of goals. Return 6-8 weeks or prn.   This visit was coded based on medical decision making (MDM).  PPS: 30%  HOSPICE ELIGIBILITY/DIAGNOSIS: TBD  Chief Complaint:  Follow up visit for Palliative Care symptom management with pt who is bedbound, total care and has left sacral decubitus.  HISTORY OF PRESENT ILLNESS:  [name] is a 86 y.o. year old female, bedbound with dementia, diabetes, hx of stroke with left sided residual weakness and left sacral decubitus down to fat and muscle layer.  Conducted visit with caregiver and daughter-in-law present.  Patient is total care and has caregiver from approximately 8 am -9 pm (with small gap in afternoon).  Pt was non-verbal with this provider but did shake her head yes or no at times.  She goes monthly to wound care center for sacral decubitus which is currently being packed with calcium alginate.  In between visits to the wound care  clinic, pt has a home health nurse who comes in and helps with wound care and daughter-in-law does the majority of wound care.  [name] states that pt was coughing some recently with intake which resolved when she pureed foods.  Appetite remains pretty good and fasting blood sugars are in the 90s. [name] has placed padding over bony prominences and uses A & D ointment as barrier protection.    History obtained from review of EMR, interview with family and caregiver and [name].  I reviewed available labs, medications, imaging, studies and related documents from the EMR.  Records reviewed and summarized above.   ROS Limited due to dementia Nods head no in response to pain   Physical Exam: Current and past weights: NO weight in the last 2 years Constitutional: NAD General: frail appearing, thin EYES: anicteric sclera, lids intact, no discharge  ENMT: oral mucous membranes moist, dentition intact CV: S1S2, RRR, no LE edema Pulmonary: CTAB, no increased work of breathing, no cough, room air Abdomen: normo-active BS + 4 quadrants, soft and non tender, no ascites GU: deferred MSK: noted sarcopenia of all 4 limbs with muscle atrophy and loss of subcutaneous fat.  Some swelling of knees bilat Skin: warm and dry, left sacral decub down to muscle layer with undermining of wound circumferentially.  Skin wound measures 7-8 cm in diameter and down about 1 3/4 inches with pink wound bed and no significant slough, scant tan drainage. No subcutaneous fat noted over bony prominences Neuro: Left sided weakness. Probable expressive and receptive aphasia with difficulty swallowing Psych: non-anxious affect, A and O to name, unable to assess further Hem/lymph/immuno: no widespread bruising   Thank you for the opportunity to participate in the care of [name].  The palliative care team will continue to follow. Please call our office at [phone] if we can be of additional assistance.   Marijo Conception, FNP   COVID-19 PATIENT SCREENING TOOL Asked and negative response unless otherwise noted:   Have you had symptoms of covid, tested positive or been in contact with someone with symptoms/positive test in the past 5-10 days?  No

## 2021-06-03 NOTE — Progress Notes (Signed)
[name] ([id number]) , Visit Report for 06/02/2021 HPI Details Patient Name: Date of Service: CAMDYN, LADEN 06/02/2021 9:45 A M Medical Record Number: [id number] Patient Account Number: [id number] Date of Birth/Sex: Treating RN: 10/27/1935 (86 y.o. [name] Primary Care Provider: [name] Other Clinician: Referring Provider: Treating Provider/Extender: Deloria Lair in Treatment: 72 History of Present Illness HPI Description: Admission 5/27 [name] is an 86 year old female with a past medical history of type 2 diabetes on oral agents, hypertension and dementia that presents today to the clinic for a wound to her right buttocks. Her daughter-in-law is present today and helps provide the history. This issue has been going on for 2 to 3 weeks now. She states she developed an eschar about 1 week ago. She has been taking doxycycline for possible soft tissue infection to the area prescribed by her primary care provider. Patient currently denies any signs of infection. 6/3; patient admitted to the clinic last week. She has a large necrotic area on her left buttock. They use Santyl last week. On arrival this visit she had completely necrotic surface with open to subcutaneous tissue. The surface was completely nonviable. They have been using Santyl. She apparently had been on doxycycline which she is just finishing prescribed by her primary doctor for possible soft tissue infection 6/7; patient with a large necrotic wound over her left buttock. Aggressive debridement last week. Culture of this grew Proteus unfortunately would not of been covered well or at least predictably by the doxycycline that her primary doctor put her on. I have therefore put her on Augmentin suspension 3 times a day. We are using silver alginate as the primary dressing while we deal with the infection. Her daughter has a list of concerns including swallowing  difficulties, concerns about aspiration. The patient has advanced dementia. If this is Alzheimer's disease it is at its preterminal stages. I went over that swallowing difficulties are part of what happens in the preterminal stages of Alzheimer's disease. Nevertheless we will family members seems to want to pursue a very aggressive course 6/21 large wound over her left buttock. She has completed the antibiotics I gave her [Augmentin]. We are using silver alginate while we are dealing with the infection and also to help with the drainage In general the wound looks better she has healthy granulation over perhaps 70% of it. Superiorly there is still necrotic debris I did not attempt to debride this today The patient has advanced dementia. I do not know that she could tolerate a wound VAC. She also has double incontinence which would make that difficult. Nevertheless she has been cared for diligently by her family. She is apparently eating well and may be 2 to 3 hours on this area all day 7/5; 2-week follow-up. She continues with a nice improvement in overall condition of the wound bed. The undermining area has still some adherent surface slough. We have been using silver alginate because of the drainage and possibility of at least superficial wound infection/colonization. Although I said in previous notes I wondered whether she could tolerate the wound VAC they have done so well with this wound at home I do not want to necessarily rule her out for a wound VAC and I am going to direct the start of a trial of wound VAC therapy if this can be covered by insurance, if we have home health support to change it at all 7/19; we did not get very far with the wound  VAC because my original ICD 10 said this was unstageable. As usual this represents a considerable frustration because we would not of ordered a wound VAC if the wound was still unstageable. This currently represents a stage IV staging. This is all the  way down through muscle layer. There is a rim of tissue over the bone and no palpable exposed bone but this is a deep wound. Fortunately at present no evidence of infection. We have been using silver collagen with backing wet-to-dry. The wound is really no better today but no worse 8/2; patient presents for 2-week follow-up. She has been using the wound VAC for the past week. There has been some issues with keeping the drape in place however home health comes out to reapply the drape when this happens. Overall there are no issues or complaints today. No signs of infection. 8/17; patient has been using the wound VAC on her wound on the upper left buttock. About 50 to 60% of this area is fully granulated however from about 10-5 o'clock there is still undermining. UNFORTUNATELY she also arrives in clinic today with a wound over her right fibular head. According to her daughter has been there for about 2 weeks. She wonders whether there is an abrasion although no concrete history of this. Given her frail status this is probably most likely a pressure ulcer over a bony prominence [fibular head] 8/31 unfortunately the upper left buttock pressure ulcer stage IV is not as optimistically better as I was hoping. The granulation that I identified on 12/23/20 is no longer adherent at the inferior pole the undermining is quite extensive but there is no palpable bone. No evidence of surrounding infection. There may be some improvement in the undermining measurements We are still dealing with the additional wound we identified 2 weeks ago on the right fibular head as well we have been using silver alginate here 9/14; 1 month follow-up. She has now had the wound VAC on for probably 2 months. No major change here if she still has the tunneling area at 12:00 and undermining from about 6-12. Surface of the wound does not look healthy. We use MolecuLight to look at the wound surface which actually looked negative however  she has extensive immunofluorescence in the wound edge from about 12-6 o'clock this actually extends beyond the visible wound margin by quite amount. 9/27; no major change in the left buttock wound. There is still undermining however no exposed bone. Granulation looks reasonable. Right fibular head is much smaller We are using Hydrofera Blue on the right fibular head silver alginate on the buttock area 10/12; left buttock wound is large with undermining. I think this is about the same as last time granulation looks reasonable there is no exposed bone. The area on the fibular head is almost completely closed 11/30; left buttock wound large wound with undermining superiorly. As her daughter points out there is some change in the granulation superiorly and more darker red color also some raised areas of hypergranulation. I cannot tell that the area is infected. We are using silver alginate she is changing this daily On the right fibular head the area is just about closed 12/28; the patient's wound over her right fibular head is closed. Her left buttock ulcer looks actually a little better. Measurements are slightly improved. There is no exposed bone. The patient's daughter came in asking me about more aggressive options to attempt to get closure of the subcu wound including surgery. I thought we had  mostly agreed that this would be a palliative type setting and that I did not expect this to actually close nevertheless I tried to answer her questions. I do not think the patient is a candidate for a myocutaneous flap. She is simply too frail and I doubt plastic surgery would consider her a viable candidate. She might be a candidate for an advanced treatment option like Oasis which sometimes can stimulate granulation to get these wounds to gradually close. I told her in order for this to make sense she would need probably a Foley catheter to control the incontinence and unfortunately weekly visits which  would be difficult on this patient 1/25; 1 month follow-up. The patient has a large stage IV left buttock ulcer. Substantially deep but no exposed bone. She has advanced dementia. I do not think she is a candidate for anything aggressive and I follow her on a palliative basis. I been over this with her daughter many times. We have been using silver alginate backing ABDs. She has a level 3 surface on her mattress in spite of this she developed some degree of erythema today on the upper left pelvis Electronic Signature(s) Signed: 06/02/2021 4:28:28 PM By: Linton Ham MD Entered By: Linton Ham on 06/02/2021 10:49:47 -------------------------------------------------------------------------------- Physical Exam Details Patient Name: Date of Service: EMMALYNE, GIACOMO 06/02/2021 9:45 A M Medical Record Number: [id number] Patient Account Number: [id number] Date of Birth/Sex: Treating RN: 06/28/1935 (86 y.o. [name] Primary Care Provider: [name] Other Clinician: Referring Provider: Treating Provider/Extender: Deloria Lair in Treatment: 74 Constitutional Patient is hypertensive.. Pulse regular and within target range for patient.Marland Kitchen Respirations regular, non-labored and within target range.. Temperature is normal and within the target range for the patient.Marland Kitchen Appears in no distressBut exceptionally frail. Notes Wound exam; left buttock. Substantial wound with significant undermining superiorly. There is no exposed bone. I think the measurements are actually worse today. There is no surrounding erythema no obvious periwound tenderness. Electronic Signature(s) Signed: 06/02/2021 4:28:28 PM By: Linton Ham MD Entered By: Linton Ham on 06/02/2021 10:50:57 -------------------------------------------------------------------------------- Physician Orders Details Patient Name: Date of Service: AAMINAH, FORRESTER 06/02/2021 9:45 A M Medical Record  Number: [id number] Patient Account Number: [id number] Date of Birth/Sex: Treating RN: 17-Nov-1935 (86 y.o. [name] Primary Care Provider: [name] Other Clinician: Referring Provider: Treating Provider/Extender: Deloria Lair in Treatment: 40 Verbal / Phone Orders: No Diagnosis Coding Follow-up Appointments Return appointment in 1 month. - with Dr. Dellia Nims Bathing/ Shower/ Hygiene May shower and wash wound with soap and water. - prior to dressing change Off-Loading Low air-loss mattress (Group 2) Turn and reposition every 2 hours Additional Orders / Instructions Follow Nutritious Diet - 100-120g of Protein Home Health No change in wound care orders this week; continue Home Health for wound care. May utilize formulary equivalent dressing for wound treatment orders unless otherwise specified. Other Home Health Orders/Instructions: - [facility] Wound Treatment Wound #2 - Gluteus Wound Laterality: Left Cleanser: Soap and Water (Home Health) 1 x Per Day/30 Days Discharge Instructions: May shower and wash wound with dial antibacterial soap and water prior to dressing change. Prim Dressing: KerraCel Ag Gelling Fiber Dressing, 4x5 in (silver alginate) (Home Health) 1 x Per Day/30 Days ary Discharge Instructions: Apply silver alginate to wound bed as instructed Secondary Dressing: Woven Gauze Sponge, Non-Sterile 4x4 in (Home Health) 1 x Per Day/30 Days Discharge Instructions: Dry gauze backing over alginate Secondary Dressing: ABD Pad, 8x10 (Home Health) 1 x Per  Day/30 Days Discharge Instructions: Apply over primary dressing as directed. Secured With: 74M Medipore H Soft Cloth Surgical T 4 x 2 (in/yd) (Home Health) 1 x Per Day/30 Days ape Discharge Instructions: Secure dressing with tape as directed. Electronic Signature(s) Signed: 06/02/2021 4:28:28 PM By: Linton Ham MD Signed: 06/03/2021 5:37:29 PM By: Rhae Hammock RN Entered By:  Rhae Hammock on 06/02/2021 10:46:37 -------------------------------------------------------------------------------- Problem List Details Patient Name: Date of Service: HATSUE, SIME 06/02/2021 9:45 A M Medical Record Number: [id number] Patient Account Number: [id number] Date of Birth/Sex: Treating RN: 08-06-1935 (86 y.o. [name], Lauren Primary Care Provider: [name] Other Clinician: Referring Provider: Treating Provider/Extender: Deloria Lair in Treatment: 26 Active Problems ICD-10 Encounter Code Description Active Date MDM Diagnosis L89.324 Pressure ulcer of left buttock, stage 4 11/24/2020 No Yes E11.9 Type 2 diabetes mellitus without complications 10/15/319 No Yes Inactive Problems ICD-10 Code Description Active Date Inactive Date L89.320 Pressure ulcer of left buttock, unstageable 10/13/2020 10/13/2020 L89.93 Pressure ulcer of unspecified site, stage 3 12/23/2020 12/23/2020 Resolved Problems Electronic Signature(s) Signed: 06/02/2021 4:28:28 PM By: Linton Ham MD Entered By: Linton Ham on 06/02/2021 10:48:12 -------------------------------------------------------------------------------- Progress Note Details Patient Name: Date of Service: [name] 06/02/2021 9:45 A M Medical Record Number: [id number] Patient Account Number: [id number] Date of Birth/Sex: Treating RN: March 25, 1936 (86 y.o. [name] Primary Care Provider: [name] Other Clinician: Referring Provider: Treating Provider/Extender: Deloria Lair in Treatment: 34 Subjective History of Present Illness (HPI) Admission 5/27 Ms. Latonda Larrivee is an 86 year old female with a past medical history of type 2 diabetes on oral agents, hypertension and dementia that presents today to the clinic for a wound to her right buttocks. Her daughter-in-law is present today and helps provide the history. This issue has been going on  for 2 to 3 weeks now. She states she developed an eschar about 1 week ago. She has been taking doxycycline for possible soft tissue infection to the area prescribed by her primary care provider. Patient currently denies any signs of infection. 6/3; patient admitted to the clinic last week. She has a large necrotic area on her left buttock. They use Santyl last week. On arrival this visit she had completely necrotic surface with open to subcutaneous tissue. The surface was completely nonviable. They have been using Santyl. She apparently had been on doxycycline which she is just finishing prescribed by her primary doctor for possible soft tissue infection 6/7; patient with a large necrotic wound over her left buttock. Aggressive debridement last week. Culture of this grew Proteus unfortunately would not of been covered well or at least predictably by the doxycycline that her primary doctor put her on. I have therefore put her on Augmentin suspension 3 times a day. We are using silver alginate as the primary dressing while we deal with the infection. Her daughter has a list of concerns including swallowing difficulties, concerns about aspiration. The patient has advanced dementia. If this is Alzheimer's disease it is at its preterminal stages. I went over that swallowing difficulties are part of what happens in the preterminal stages of Alzheimer's disease. Nevertheless we will family members seems to want to pursue a very aggressive course 6/21 large wound over her left buttock. She has completed the antibiotics I gave her [Augmentin]. We are using silver alginate while we are dealing with the infection and also to help with the drainage In general the wound looks better she has healthy granulation over perhaps 70% of it.  Superiorly there is still necrotic debris I did not attempt to debride this today The patient has advanced dementia. I do not know that she could tolerate a wound VAC. She also has  double incontinence which would make that difficult. Nevertheless she has been cared for diligently by her family. She is apparently eating well and may be 2 to 3 hours on this area all day 7/5; 2-week follow-up. She continues with a nice improvement in overall condition of the wound bed. The undermining area has still some adherent surface slough. We have been using silver alginate because of the drainage and possibility of at least superficial wound infection/colonization. Although I said in previous notes I wondered whether she could tolerate the wound VAC they have done so well with this wound at home I do not want to necessarily rule her out for a wound VAC and I am going to direct the start of a trial of wound VAC therapy if this can be covered by insurance, if we have home health support to change it at all 7/19; we did not get very far with the wound VAC because my original ICD 10 said this was unstageable. As usual this represents a considerable frustration because we would not of ordered a wound VAC if the wound was still unstageable. This currently represents a stage IV staging. This is all the way down through muscle layer. There is a rim of tissue over the bone and no palpable exposed bone but this is a deep wound. Fortunately at present no evidence of infection. We have been using silver collagen with backing wet-to-dry. The wound is really no better today but no worse 8/2; patient presents for 2-week follow-up. She has been using the wound VAC for the past week. There has been some issues with keeping the drape in place however home health comes out to reapply the drape when this happens. Overall there are no issues or complaints today. No signs of infection. 8/17; patient has been using the wound VAC on her wound on the upper left buttock. About 50 to 60% of this area is fully granulated however from about 10-5 o'clock there is still undermining. UNFORTUNATELY she also arrives in  clinic today with a wound over her right fibular head. According to her daughter has been there for about 2 weeks. She wonders whether there is an abrasion although no concrete history of this. Given her frail status this is probably most likely a pressure ulcer over a bony prominence [fibular head] 8/31 unfortunately the upper left buttock pressure ulcer stage IV is not as optimistically better as I was hoping. The granulation that I identified on 12/23/20 is no longer adherent at the inferior pole the undermining is quite extensive but there is no palpable bone. No evidence of surrounding infection. There may be some improvement in the undermining measurements We are still dealing with the additional wound we identified 2 weeks ago on the right fibular head as well we have been using silver alginate here 9/14; 1 month follow-up. She has now had the wound VAC on for probably 2 months. No major change here if she still has the tunneling area at 12:00 and undermining from about 6-12. Surface of the wound does not look healthy. We use MolecuLight to look at the wound surface which actually looked negative however she has extensive immunofluorescence in the wound edge from about 12-6 o'clock this actually extends beyond the visible wound margin by quite amount. 9/27; no major change  in the left buttock wound. There is still undermining however no exposed bone. Granulation looks reasonable. oo Right fibular head is much smaller We are using Hydrofera Blue on the right fibular head silver alginate on the buttock area 10/12; left buttock wound is large with undermining. I think this is about the same as last time granulation looks reasonable there is no exposed bone. The area on the fibular head is almost completely closed 11/30; left buttock wound large wound with undermining superiorly. As her daughter points out there is some change in the granulation superiorly and more darker red color also some  raised areas of hypergranulation. I cannot tell that the area is infected. We are using silver alginate she is changing this daily On the right fibular head the area is just about closed 12/28; the patient's wound over her right fibular head is closed. Her left buttock ulcer looks actually a little better. Measurements are slightly improved. There is no exposed bone. The patient's daughter came in asking me about more aggressive options to attempt to get closure of the subcu wound including surgery. I thought we had mostly agreed that this would be a palliative type setting and that I did not expect this to actually close nevertheless I tried to answer her questions. I do not think the patient is a candidate for a myocutaneous flap. She is simply too frail and I doubt plastic surgery would consider her a viable candidate. She might be a candidate for an advanced treatment option like Oasis which sometimes can stimulate granulation to get these wounds to gradually close. I told her in order for this to make sense she would need probably a Foley catheter to control the incontinence and unfortunately weekly visits which would be difficult on this patient 1/25; 1 month follow-up. The patient has a large stage IV left buttock ulcer. Substantially deep but no exposed bone. She has advanced dementia. I do not think she is a candidate for anything aggressive and I follow her on a palliative basis. I been over this with her daughter many times. We have been using silver alginate backing ABDs. She has a level 3 surface on her mattress in spite of this she developed some degree of erythema today on the upper left pelvis Objective Constitutional Patient is hypertensive.. Pulse regular and within target range for patient.Marland Kitchen Respirations regular, non-labored and within target range.. Temperature is normal and within the target range for the patient.Marland Kitchen Appears in no distressBut exceptionally frail. Vitals Time  Taken: 10:25 AM, Height: 68 in, Weight: 110 lbs, BMI: 16.7, Temperature: 98.7 F, Pulse: 74 bpm, Respiratory Rate: 17 breaths/min, Blood Pressure: 202/72 mmHg. General Notes: Wound exam; left buttock. Substantial wound with significant undermining superiorly. There is no exposed bone. I think the measurements are actually worse today. There is no surrounding erythema no obvious periwound tenderness. Integumentary (Hair, Skin) Wound #2 status is Open. Original cause of wound was Pressure Injury. The date acquired was: 09/18/2020. The wound has been in treatment 34 weeks. The wound is located on the Left Gluteus. The wound measures 6cm length x 4cm width x 2.7cm depth; 18.85cm^2 area and 50.894cm^3 volume. There is Fat Layer (Subcutaneous Tissue) exposed. There is no tunneling noted, however, there is undermining starting at 7:00 and ending at 4:00 with a maximum distance of 3.5cm. There is a medium amount of serosanguineous drainage noted. The wound margin is epibole. There is medium (34-66%) red, pink granulation within the wound bed. There is a medium (34-66%)  amount of necrotic tissue within the wound bed including Adherent Slough. Assessment Active Problems ICD-10 Pressure ulcer of left buttock, stage 4 Type 2 diabetes mellitus without complications Plan Follow-up Appointments: Return appointment in 1 month. - with Dr. Dellia Nims Bathing/ Shower/ Hygiene: May shower and wash wound with soap and water. - prior to dressing change Off-Loading: Low air-loss mattress (Group 2) Turn and reposition every 2 hours Additional Orders / Instructions: Follow Nutritious Diet - 100-120g of Protein Home Health: No change in wound care orders this week; continue Home Health for wound care. May utilize formulary equivalent dressing for wound treatment orders unless otherwise specified. Other Home Health Orders/Instructions: - [facility] WOUND #2: - Gluteus Wound Laterality: Left Cleanser: Soap and Water  (Home Health) 1 x Per Day/30 Days Discharge Instructions: May shower and wash wound with dial antibacterial soap and water prior to dressing change. Prim Dressing: KerraCel Ag Gelling Fiber Dressing, 4x5 in (silver alginate) (Home Health) 1 x Per Day/30 Days ary Discharge Instructions: Apply silver alginate to wound bed as instructed Secondary Dressing: Woven Gauze Sponge, Non-Sterile 4x4 in (Home Health) 1 x Per Day/30 Days Discharge Instructions: Dry gauze backing over alginate Secondary Dressing: ABD Pad, 8x10 (Home Health) 1 x Per Day/30 Days Discharge Instructions: Apply over primary dressing as directed. Secured With: 55M Medipore H Soft Cloth Surgical T 4 x 2 (in/yd) (Home Health) 1 x Per Day/30 Days ape Discharge Instructions: Secure dressing with tape as directed. 1. Palliative visit for this patient. I have continued the silver alginate. 2. The wound appears slightly larger today the undermining slightly worse. There is still no exposed bone 3. I wonder about underlying osteomyelitis but I think this patient is far too frail for advanced imaging. Nor have I been pushing the issue of lab work. She apparently has been up on the wound a little more since the last time we saw her 4. I have viewed this is strictly palliative in terms of her visits here and each time where here I do my best to explain this to the caregiver. I do not think she is a candidate for aggressive surgical [plastic surgery] consultation or advanced medical treatment. Electronic Signature(s) Signed: 06/02/2021 4:28:28 PM By: Linton Ham MD Entered By: Linton Ham on 06/02/2021 10:52:53 -------------------------------------------------------------------------------- SuperBill Details Patient Name: Date of Service: HETTIE, ROSELLI 06/02/2021 Medical Record Number: [id number] Patient Account Number: [id number] Date of Birth/Sex: Treating RN: [birth year] (86 y.o. [name], Lauren Primary Care Provider:  [name] Other Clinician: Referring Provider: Treating Provider/Extender: Deloria Lair in Treatment: 34 Diagnosis Coding ICD-10 Codes Code Description [phone] Pressure ulcer of left buttock, stage 4 E11.9 Type 2 diabetes mellitus without complications Facility Procedures CPT4 Code: [id number] Description: 99213 - WOUND CARE VISIT-LEV 3 EST PT Modifier: Quantity: 1 Physician Procedures : CPT4 Code Description Modifier [id number] - WC PHYS LEVEL 3 - EST PT ICD-10 Diagnosis Description L89.324 Pressure ulcer of left buttock, stage 4 E11.9 Type 2 diabetes mellitus without complications Quantity: 1 Electronic Signature(s) Signed: 06/02/2021 4:28:28 PM By: Linton Ham MD Signed: 06/03/2021 5:37:29 PM By: Rhae Hammock RN Entered By: Rhae Hammock on 06/02/2021 11:02:01

## 2021-06-06 DIAGNOSIS — E43 Unspecified severe protein-calorie malnutrition: Secondary | ICD-10-CM | POA: Insufficient documentation

## 2021-06-06 DIAGNOSIS — R1310 Dysphagia, unspecified: Secondary | ICD-10-CM | POA: Insufficient documentation

## 2021-06-06 DIAGNOSIS — Z515 Encounter for palliative care: Secondary | ICD-10-CM | POA: Insufficient documentation

## 2021-06-08 DIAGNOSIS — E1140 Type 2 diabetes mellitus with diabetic neuropathy, unspecified: Secondary | ICD-10-CM | POA: Diagnosis not present

## 2021-06-08 DIAGNOSIS — F0390 Unspecified dementia without behavioral disturbance: Secondary | ICD-10-CM | POA: Diagnosis not present

## 2021-06-08 DIAGNOSIS — E785 Hyperlipidemia, unspecified: Secondary | ICD-10-CM | POA: Diagnosis not present

## 2021-06-08 DIAGNOSIS — L89893 Pressure ulcer of other site, stage 3: Secondary | ICD-10-CM | POA: Diagnosis not present

## 2021-06-08 DIAGNOSIS — I890 Lymphedema, not elsewhere classified: Secondary | ICD-10-CM | POA: Diagnosis not present

## 2021-06-08 DIAGNOSIS — L89320 Pressure ulcer of left buttock, unstageable: Secondary | ICD-10-CM | POA: Diagnosis not present

## 2021-06-16 DIAGNOSIS — F0390 Unspecified dementia without behavioral disturbance: Secondary | ICD-10-CM | POA: Diagnosis not present

## 2021-06-16 DIAGNOSIS — E1140 Type 2 diabetes mellitus with diabetic neuropathy, unspecified: Secondary | ICD-10-CM | POA: Diagnosis not present

## 2021-06-16 DIAGNOSIS — I890 Lymphedema, not elsewhere classified: Secondary | ICD-10-CM | POA: Diagnosis not present

## 2021-06-16 DIAGNOSIS — L89893 Pressure ulcer of other site, stage 3: Secondary | ICD-10-CM | POA: Diagnosis not present

## 2021-06-16 DIAGNOSIS — L89320 Pressure ulcer of left buttock, unstageable: Secondary | ICD-10-CM | POA: Diagnosis not present

## 2021-06-16 DIAGNOSIS — E785 Hyperlipidemia, unspecified: Secondary | ICD-10-CM | POA: Diagnosis not present

## 2021-06-22 DIAGNOSIS — L89893 Pressure ulcer of other site, stage 3: Secondary | ICD-10-CM | POA: Diagnosis not present

## 2021-06-22 DIAGNOSIS — E1140 Type 2 diabetes mellitus with diabetic neuropathy, unspecified: Secondary | ICD-10-CM | POA: Diagnosis not present

## 2021-06-22 DIAGNOSIS — F0390 Unspecified dementia without behavioral disturbance: Secondary | ICD-10-CM | POA: Diagnosis not present

## 2021-06-22 DIAGNOSIS — L89320 Pressure ulcer of left buttock, unstageable: Secondary | ICD-10-CM | POA: Diagnosis not present

## 2021-06-22 DIAGNOSIS — I890 Lymphedema, not elsewhere classified: Secondary | ICD-10-CM | POA: Diagnosis not present

## 2021-06-22 DIAGNOSIS — E785 Hyperlipidemia, unspecified: Secondary | ICD-10-CM | POA: Diagnosis not present

## 2021-06-26 DIAGNOSIS — E785 Hyperlipidemia, unspecified: Secondary | ICD-10-CM | POA: Diagnosis not present

## 2021-06-26 DIAGNOSIS — M6281 Muscle weakness (generalized): Secondary | ICD-10-CM | POA: Diagnosis not present

## 2021-06-26 DIAGNOSIS — K5900 Constipation, unspecified: Secondary | ICD-10-CM | POA: Diagnosis not present

## 2021-06-26 DIAGNOSIS — Z7984 Long term (current) use of oral hypoglycemic drugs: Secondary | ICD-10-CM | POA: Diagnosis not present

## 2021-06-26 DIAGNOSIS — Z741 Need for assistance with personal care: Secondary | ICD-10-CM | POA: Diagnosis not present

## 2021-06-26 DIAGNOSIS — Z7901 Long term (current) use of anticoagulants: Secondary | ICD-10-CM | POA: Diagnosis not present

## 2021-06-26 DIAGNOSIS — F0390 Unspecified dementia without behavioral disturbance: Secondary | ICD-10-CM | POA: Diagnosis not present

## 2021-06-26 DIAGNOSIS — L89893 Pressure ulcer of other site, stage 3: Secondary | ICD-10-CM | POA: Diagnosis not present

## 2021-06-26 DIAGNOSIS — E1140 Type 2 diabetes mellitus with diabetic neuropathy, unspecified: Secondary | ICD-10-CM | POA: Diagnosis not present

## 2021-06-26 DIAGNOSIS — I890 Lymphedema, not elsewhere classified: Secondary | ICD-10-CM | POA: Diagnosis not present

## 2021-06-26 DIAGNOSIS — Z8673 Personal history of transient ischemic attack (TIA), and cerebral infarction without residual deficits: Secondary | ICD-10-CM | POA: Diagnosis not present

## 2021-06-26 DIAGNOSIS — I10 Essential (primary) hypertension: Secondary | ICD-10-CM | POA: Diagnosis not present

## 2021-06-26 DIAGNOSIS — L89320 Pressure ulcer of left buttock, unstageable: Secondary | ICD-10-CM | POA: Diagnosis not present

## 2021-06-26 DIAGNOSIS — Z86718 Personal history of other venous thrombosis and embolism: Secondary | ICD-10-CM | POA: Diagnosis not present

## 2021-06-26 DIAGNOSIS — Z86711 Personal history of pulmonary embolism: Secondary | ICD-10-CM | POA: Diagnosis not present

## 2021-06-28 DIAGNOSIS — E785 Hyperlipidemia, unspecified: Secondary | ICD-10-CM | POA: Diagnosis not present

## 2021-06-28 DIAGNOSIS — L89893 Pressure ulcer of other site, stage 3: Secondary | ICD-10-CM | POA: Diagnosis not present

## 2021-06-28 DIAGNOSIS — I890 Lymphedema, not elsewhere classified: Secondary | ICD-10-CM | POA: Diagnosis not present

## 2021-06-28 DIAGNOSIS — L89320 Pressure ulcer of left buttock, unstageable: Secondary | ICD-10-CM | POA: Diagnosis not present

## 2021-06-28 DIAGNOSIS — E1140 Type 2 diabetes mellitus with diabetic neuropathy, unspecified: Secondary | ICD-10-CM | POA: Diagnosis not present

## 2021-06-28 DIAGNOSIS — F0390 Unspecified dementia without behavioral disturbance: Secondary | ICD-10-CM | POA: Diagnosis not present

## 2021-06-30 ENCOUNTER — Other Ambulatory Visit: Payer: Self-pay

## 2021-06-30 ENCOUNTER — Encounter (HOSPITAL_BASED_OUTPATIENT_CLINIC_OR_DEPARTMENT_OTHER): Payer: Medicare Other | Attending: Internal Medicine | Admitting: General Surgery

## 2021-06-30 DIAGNOSIS — I10 Essential (primary) hypertension: Secondary | ICD-10-CM | POA: Diagnosis not present

## 2021-06-30 DIAGNOSIS — L89324 Pressure ulcer of left buttock, stage 4: Secondary | ICD-10-CM | POA: Diagnosis not present

## 2021-06-30 DIAGNOSIS — E11622 Type 2 diabetes mellitus with other skin ulcer: Secondary | ICD-10-CM | POA: Diagnosis not present

## 2021-06-30 DIAGNOSIS — E1151 Type 2 diabetes mellitus with diabetic peripheral angiopathy without gangrene: Secondary | ICD-10-CM | POA: Diagnosis not present

## 2021-06-30 NOTE — Progress Notes (Signed)
[name] ([id number]) , Visit Report for 06/30/2021 Arrival Information Details Patient Name: Date of Service: Carolyn, Contreras 06/30/2021 9:45 A M Medical Record Number: [id number] Patient Account Number: [id number] Date of Birth/Sex: Treating RN: 1935-05-17 (86 y.o. [name] Primary Care Adream Parzych: [name] Other Clinician: Referring Larrisha Babineau: Treating Raynah Gomes/Extender: Deloria Lair in Treatment: 38 Visit Information History Since Last Visit Added or deleted any medications: No Patient Arrived: Wheel Chair Any new allergies or adverse reactions: No Arrival Time: 10:10 Had a fall or experienced change in No Accompanied By: daughter in law activities of daily living that may affect Transfer Assistance: Manual risk of falls: Patient Requires Transmission-Based Precautions: No Signs or symptoms of abuse/neglect since last visito No Patient Has Alerts: No Hospitalized since last visit: No Implantable device outside of the clinic excluding No cellular tissue based products placed in the center since last visit: Has Dressing in Place as Prescribed: Yes Pain Present Now: No Electronic Signature(s) Signed: 06/30/2021 12:06:08 PM By: Dellie Catholic RN Entered By: Dellie Catholic on 06/30/2021 10:11:07 -------------------------------------------------------------------------------- Clinic Level of Care Assessment Details Patient Name: Date of Service: Carolyn, Contreras 06/30/2021 9:45 A M Medical Record Number: [id number] Patient Account Number: [id number] Date of Birth/Sex: Treating RN: 1936-03-26 (86 y.o. [name] Primary Care Amando Chaput: [name] Other Clinician: Referring Kailan Laws: Treating Elohim Brune/Extender: Deloria Lair in Treatment: 38 Clinic Level of Care Assessment Items TOOL 4 Quantity Score X- 1 0 Use when only an EandM is performed on FOLLOW-UP visit ASSESSMENTS - Nursing Assessment /  Reassessment X- 1 10 Reassessment of Co-morbidities (includes updates in patient status) X- 1 5 Reassessment of Adherence to Treatment Plan ASSESSMENTS - Wound and Skin A ssessment / Reassessment X - Simple Wound Assessment / Reassessment - one wound 1 5 _0  - 0 Complex Wound Assessment / Reassessment - multiple wounds _1  - 0 Dermatologic / Skin Assessment (not related to wound area) ASSESSMENTS - Focused Assessment _2  - 0 Circumferential Edema Measurements - multi extremities _3  - 0 Nutritional Assessment / Counseling / Intervention _4  - 0 Lower Extremity Assessment (monofilament, tuning fork, pulses) _5  - 0 Peripheral Arterial Disease Assessment (using hand held doppler) ASSESSMENTS - Ostomy and/or Continence Assessment and Care _6  - 0 Incontinence Assessment and Management _7  - 0 Ostomy Care Assessment and Management (repouching, etc.) PROCESS - Coordination of Care X - Simple Patient / Family Education for ongoing care 1 15 _8  - 0 Complex (extensive) Patient / Family Education for ongoing care X- 1 10 Staff obtains Programmer, systems, Records, T Results / Process Orders est X- 1 10 Staff telephones HHA, Nursing Homes / Clarify orders / etc _9  - 0 Routine Transfer to another Facility (non-emergent condition) _10  - 0 Routine Hospital Admission (non-emergent condition) _11  - 0 New Admissions / Biomedical engineer / Ordering NPWT Apligraf, etc. , _12  - 0 Emergency Hospital Admission (emergent condition) X- 1 10 Simple Discharge Coordination _13  - 0 Complex (extensive) Discharge Coordination PROCESS - Special Needs _14  - 0 Pediatric / Minor Patient Management _15  - 0 Isolation Patient Management _16  - 0 Hearing / Language / Visual special needs _17  - 0 Assessment of Community assistance (transportation, D/C planning, etc.) _18  - 0 Additional assistance / Altered mentation _19  - 0 Support Surface(s) Assessment (bed, cushion, seat, etc.) INTERVENTIONS - Wound Cleansing /  Measurement X - Simple Wound Cleansing - one wound 1 5 _20  - 0 Complex Wound Cleansing - multiple wounds X- 1 5 Wound Imaging (photographs -  any number of wounds) _0  - 0 Wound Tracing (instead of photographs) X- 1 5 Simple Wound Measurement - one wound _1  - 0 Complex Wound Measurement - multiple wounds INTERVENTIONS - Wound Dressings X - Small Wound Dressing one or multiple wounds 1 10 _2  - 0 Medium Wound Dressing one or multiple wounds _3  - 0 Large Wound Dressing one or multiple wounds <EXBMWUXLKGMWNUUV>_2<\/ZDGUYQIHKVQQVZDG>_3  - 0 Application of Medications - topical <OVFIEPPIRJJOACZY>_6<\/AYTKZSWFUXNATFTD>_3  - 0 Application of Medications - injection INTERVENTIONS - Miscellaneous _6  - 0 External ear exam _7  - 0 Specimen Collection (cultures, biopsies, blood, body fluids, etc.) _8  - 0 Specimen(s) / Culture(s) sent or taken to Lab for analysis _9  - 0 Patient Transfer (multiple staff / Civil Service fast streamer / Similar devices) _10  - 0 Simple Staple / Suture removal (25 or less) _11  - 0 Complex Staple / Suture removal (26 or more) _12  - 0 Hypo / Hyperglycemic Management (close monitor of Blood Glucose) _13  - 0 Ankle / Brachial Index (ABI) - do not check if billed separately X- 1 5 Vital Signs Has the patient been seen at the hospital within the last three years: Yes Total Score: 95 Level Of Care: New/Established - Level 3 Electronic Signature(s) Signed: 06/30/2021 12:06:08 PM By: Dellie Catholic RN Entered By: Dellie Catholic on 06/30/2021 12:03:53 -------------------------------------------------------------------------------- Encounter Discharge Information Details Patient Name: Date of Service: [name] 06/30/2021 9:45 A M Medical Record Number: [id number] Patient Account Number: [id number] Date of Birth/Sex: Treating RN: April 02, 1936 (86 y.o. [name] Primary Care Joeseph Verville: [name] Other Clinician: Referring Magin Balbi: Treating Kylen Schliep/Extender: Deloria Lair in Treatment: 38 Encounter Discharge Information  Items Discharge Condition: Stable Ambulatory Status: Wheelchair Discharge Destination: Home Transportation: Private Auto Accompanied By: [name] Schedule Follow-up Appointment: Yes Clinical Summary of Care: Patient Declined Electronic Signature(s) Signed: 06/30/2021 12:06:08 PM By: Dellie Catholic RN Entered By: Dellie Catholic on 06/30/2021 12:05:12 -------------------------------------------------------------------------------- Lower Extremity Assessment Details Patient Name: Date of Service: CRESTA, RIDEN 06/30/2021 9:45 A M Medical Record Number: [id number] Patient Account Number: [id number] Date of Birth/Sex: Treating RN: [birth year] (86 y.o. [name] Primary Care Margee Trentham: [name] Other Clinician: Referring Skila Rollins: Treating Lorance Pickeral/Extender: Deloria Lair in Treatment: 38 Edema Assessment Assessed: Shirlyn Goltz: Yes] [name] Paradise: Yes] [Left: Edema] [name] Paradise: :] Electronic Signature(s) Signed: 06/30/2021 51:76:16 PM By: Dellie Catholic RN Entered By: Dellie Catholic on 06/30/2021 10:14:52 -------------------------------------------------------------------------------- Multi Wound Chart Details Patient Name: Date of Service: [name] 06/30/2021 9:45 A M Medical Record Number: [id number] Patient Account Number: [id number] Date of Birth/Sex: Treating RN: 1936-01-04 (86 y.o. [name] Primary Care Donovon Micheletti: [name] Other Clinician: Referring Shawon Denzer: Treating Tayvian Holycross/Extender: Deloria Lair in Treatment: 38 Vital Signs Height(in): 70 Pulse(bpm): 66 Weight(lbs): 110 Blood Pressure(mmHg): 200/84 Body Mass Index(BMI): 16.7 Temperature(F): 98.2 Respiratory Rate(breaths/min): 16 Photos: [N/A:N/A] Left Gluteus N/A N/A Wound Location: Pressure Injury N/A N/A Wounding Event: Pressure Ulcer N/A N/A Primary Etiology: Anemia, Deep Vein Thrombosis, N/A N/A Comorbid  History: Hypertension, Type II Diabetes, Dementia 09/18/2020 N/A N/A Date Acquired: 28 N/A N/A Weeks of Treatment: Open N/A N/A Wound Status: No N/A N/A Wound Recurrence: 5x4.6x2 N/A N/A Measurements L x W x D (cm) 18.064 N/A N/A A (cm) : rea 36.128 N/A N/A Volume (cm) : 56.60% N/A N/A % Reduction in A rea: -767.20% N/A N/A % Reduction in Volume: 12 Starting Position 1 (o'clock): 12 Ending Position 1 (o'clock): 2.6 Maximum Distance 1 (cm): Yes N/A N/A Undermining: Category/Stage III N/A N/A Classification: Medium  N/A N/A Exudate A mount: Serosanguineous N/A N/A Exudate Type: red, brown N/A N/A Exudate Color: Epibole N/A N/A Wound Margin: Medium (34-66%) N/A N/A Granulation A mount: Red, Pink N/A N/A Granulation Quality: Medium (34-66%) N/A N/A Necrotic A mount: Fat Layer (Subcutaneous Tissue): Yes N/A N/A Exposed Structures: Medium (34-66%) N/A N/A Epithelialization: Treatment Notes Electronic Signature(s) Signed: 06/30/2021 12:06:08 PM By: Dellie Catholic RN Signed: 06/30/2021 4:06:18 PM By: Linton Ham MD Entered By: Linton Ham on 06/30/2021 10:44:14 -------------------------------------------------------------------------------- Multi-Disciplinary Care Plan Details Patient Name: Date of Service: Emerald 06/30/2021 9:45 A M Medical Record Number: [id number] Patient Account Number: [id number] Date of Birth/Sex: Treating RN: [birth year] (86 y.o. [name] Primary Care Caron Tardif: [name] Other Clinician: Referring Dayvian Blixt: Treating Anshi Jalloh/Extender: Deloria Lair in Treatment: 38 Active Inactive Wound/Skin Impairment Nursing Diagnoses: Impaired tissue integrity Goals: Patient/caregiver will verbalize understanding of skin care regimen Date Initiated: 10/02/2020 Target Resolution Date: 08/02/2021 Goal Status: Active Ulcer/skin breakdown will have a volume reduction of 30% by week 4 Date  Initiated: 10/02/2020 Date Inactivated: 01/06/2021 Target Resolution Date: 01/09/2021 Goal Status: Met Interventions: Assess patient/caregiver ability to obtain necessary supplies Assess patient/caregiver ability to perform ulcer/skin care regimen upon admission and as needed Assess ulceration(s) every visit Provide education on ulcer and skin care Treatment Activities: Skin care regimen initiated : 10/02/2020 Topical wound management initiated : 10/02/2020 Notes: 11/10/20 : Goal not yet met, eschar debrided off increasing wound size. Electronic Signature(s) Signed: 06/30/2021 12:06:08 PM By: Dellie Catholic RN Entered By: Dellie Catholic on 06/30/2021 12:02:10 -------------------------------------------------------------------------------- Pain Assessment Details Patient Name: Date of Service: MINIE, ROADCAP 06/30/2021 9:45 A M Medical Record Number: [id number] Patient Account Number: [id number] Date of Birth/Sex: Treating RN: Apr 30, 1936 (86 y.o. [name] Primary Care Veron Senner: [name] Other Clinician: Referring Laketa Sandoz: Treating Glenyce Randle/Extender: Deloria Lair in Treatment: 38 Active Problems Location of Pain Severity and Description of Pain Patient Has Paino No Site Locations Pain Management and Medication Current Pain Management: Electronic Signature(s) Signed: 06/30/2021 12:06:08 PM By: Dellie Catholic RN Entered By: Dellie Catholic on 06/30/2021 10:13:40 -------------------------------------------------------------------------------- Patient/Caregiver Education Details Patient Name: Date of Service: [name] 2/22/2023andnbsp9:45 [place] Record Number: [id number] Patient Account Number: [id number] Date of Birth/Gender: Treating RN: 1935-09-18 (86 y.o. [name] Primary Care Physician: [name] Other Clinician: Referring Physician: Treating Physician/Extender: Deloria Lair in  Treatment: 29 Education Assessment Education Provided To: Patient Education Topics Provided Wound/Skin Impairment: Methods: Explain/Verbal Responses: Return demonstration correctly Electronic Signature(s) Signed: 06/30/2021 12:06:08 PM By: Dellie Catholic RN Entered By: Dellie Catholic on 06/30/2021 12:02:46 -------------------------------------------------------------------------------- Wound Assessment Details Patient Name: Date of Service: COVA, KNIERIEM 06/30/2021 9:45 A M Medical Record Number: [id number] Patient Account Number: [id number] Date of Birth/Sex: Treating RN: January 22, 1936 (86 y.o. [name] Primary Care Aaleah Hirsch: [name] Other Clinician: Referring Sriyan Cutting: Treating Averie Meiner/Extender: Deloria Lair in Treatment: 38 Wound Status Wound Number: 2 Primary Pressure Ulcer Etiology: Wound Location: Left Gluteus Wound Status: Open Wounding Event: Pressure Injury Comorbid Anemia, Deep Vein Thrombosis, Hypertension, Type II Date Acquired: 09/18/2020 History: Diabetes, Dementia Weeks Of Treatment: 38 Clustered Wound: No Photos Wound Measurements Length: (cm) 5 Width: (cm) 4.6 Depth: (cm) 2 Area: (cm) 18.064 Volume: (cm) 36.128 % Reduction in Area: 56.6% % Reduction in Volume: -767.2% Epithelialization: Medium (34-66%) Tunneling: No Undermining: Yes Starting Position (o'clock): 12 Ending Position (o'clock): 12 Maximum Distance: (cm) 2.6 Wound Description Classification: Category/Stage III Wound Margin: Epibole Exudate  Amount: Medium Exudate Type: Serosanguineous Exudate Color: red, brown Foul Odor After Cleansing: No Slough/Fibrino Yes Wound Bed Granulation Amount: Medium (34-66%) Exposed Structure Granulation Quality: Red, Pink Fat Layer (Subcutaneous Tissue) Exposed: Yes Necrotic Amount: Medium (34-66%) Necrotic Quality: Adherent Slough Treatment Notes Wound #2 (Gluteus) Wound Laterality:  Left Cleanser Soap and Water Discharge Instruction: May shower and wash wound with dial antibacterial soap and water prior to dressing change. Peri-Wound Care Topical Primary Dressing KerraCel Ag Gelling Fiber Dressing, 4x5 in (silver alginate) Discharge Instruction: Apply silver alginate to wound bed as instructed Secondary Dressing Woven Gauze Sponge, Non-Sterile 4x4 in Discharge Instruction: Dry gauze backing over alginate ABD Pad, 8x10 Discharge Instruction: Apply over primary dressing as directed. Secured With 70M Medipore H Soft Cloth Surgical T 4 x 2 (in/yd) ape Discharge Instruction: Secure dressing with tape as directed. Compression Wrap Compression Stockings Add-Ons Electronic Signature(s) Signed: 06/30/2021 12:06:08 PM By: Dellie Catholic RN Entered By: Dellie Catholic on 06/30/2021 10:31:45 -------------------------------------------------------------------------------- Vitals Details Patient Name: Date of Service: [name] 06/30/2021 9:45 A M Medical Record Number: [id number] Patient Account Number: [id number] Date of Birth/Sex: Treating RN: Sep 26, 1935 (86 y.o. [name] Primary Care Nolah Krenzer: [name] Other Clinician: Referring Aroush Chasse: Treating Rosiland Sen/Extender: Deloria Lair in Treatment: 38 Vital Signs Time Taken: 10:11 Temperature (F): 98.2 Height (in): 68 Pulse (bpm): 71 Weight (lbs): 110 Respiratory Rate (breaths/min): 16 Body Mass Index (BMI): 16.7 Blood Pressure (mmHg): 200/84 Reference Range: 80 - 120 mg / dl Electronic Signature(s) Signed: 06/30/2021 12:06:08 PM By: Dellie Catholic RN Entered By: Dellie Catholic on 06/30/2021 10:13:33

## 2021-06-30 NOTE — Progress Notes (Signed)
[name] ([id number]) , Visit Report for 06/30/2021 HPI Details Patient Name: Date of Service: [name], [name] 06/30/2021 9:45 A M Medical Record Number: [id number] Patient Account Number: [id number] Date of Birth/Sex: Treating RN: Aug 15, 1935 (86 y.o. [name] Primary Care Provider: [name] Other Clinician: Referring Provider: Treating Provider/Extender: Deloria Lair in Treatment: 77 History of Present Illness HPI Description: Admission 5/27 [name] is an 86 year old female with a past medical history of type 2 diabetes on oral agents, hypertension and dementia that presents today to the clinic for a wound to her right buttocks. Her daughter-in-law is present today and helps provide the history. This issue has been going on for 2 to 3 weeks now. She states she developed an eschar about 1 week ago. She has been taking doxycycline for possible soft tissue infection to the area prescribed by her primary care provider. Patient currently denies any signs of infection. 6/3; patient admitted to the clinic last week. She has a large necrotic area on her left buttock. They use Santyl last week. On arrival this visit she had completely necrotic surface with open to subcutaneous tissue. The surface was completely nonviable. They have been using Santyl. She apparently had been on doxycycline which she is just finishing prescribed by her primary doctor for possible soft tissue infection 6/7; patient with a large necrotic wound over her left buttock. Aggressive debridement last week. Culture of this grew Proteus unfortunately would not of been covered well or at least predictably by the doxycycline that her primary doctor put her on. I have therefore put her on Augmentin suspension 3 times a day. We are using silver alginate as the primary dressing while we deal with the infection. Her daughter has a list of concerns including swallowing difficulties,  concerns about aspiration. The patient has advanced dementia. If this is Alzheimer's disease it is at its preterminal stages. I went over that swallowing difficulties are part of what happens in the preterminal stages of Alzheimer's disease. Nevertheless we will family members seems to want to pursue a very aggressive course 6/21 large wound over her left buttock. She has completed the antibiotics I gave her [Augmentin]. We are using silver alginate while we are dealing with the infection and also to help with the drainage In general the wound looks better she has healthy granulation over perhaps 70% of it. Superiorly there is still necrotic debris I did not attempt to debride this today The patient has advanced dementia. I do not know that she could tolerate a wound VAC. She also has double incontinence which would make that difficult. Nevertheless she has been cared for diligently by her family. She is apparently eating well and may be 2 to 3 hours on this area all day 7/5; 2-week follow-up. She continues with a nice improvement in overall condition of the wound bed. The undermining area has still some adherent surface slough. We have been using silver alginate because of the drainage and possibility of at least superficial wound infection/colonization. Although I said in previous notes I wondered whether she could tolerate the wound VAC they have done so well with this wound at home I do not want to necessarily rule her out for a wound VAC and I am going to direct the start of a trial of wound VAC therapy if this can be covered by insurance, if we have home health support to change it at all 7/19; we did not get very far with the wound  VAC because my original ICD 10 said this was unstageable. As usual this represents a considerable frustration because we would not of ordered a wound VAC if the wound was still unstageable. This currently represents a stage IV staging. This is all the way down  through muscle layer. There is a rim of tissue over the bone and no palpable exposed bone but this is a deep wound. Fortunately at present no evidence of infection. We have been using silver collagen with backing wet-to-dry. The wound is really no better today but no worse 8/2; patient presents for 2-week follow-up. She has been using the wound VAC for the past week. There has been some issues with keeping the drape in place however home health comes out to reapply the drape when this happens. Overall there are no issues or complaints today. No signs of infection. 8/17; patient has been using the wound VAC on her wound on the upper left buttock. About 50 to 60% of this area is fully granulated however from about 10-5 o'clock there is still undermining. UNFORTUNATELY she also arrives in clinic today with a wound over her right fibular head. According to her daughter has been there for about 2 weeks. She wonders whether there is an abrasion although no concrete history of this. Given her frail status this is probably most likely a pressure ulcer over a bony prominence [fibular head] 8/31 unfortunately the upper left buttock pressure ulcer stage IV is not as optimistically better as I was hoping. The granulation that I identified on 12/23/20 is no longer adherent at the inferior pole the undermining is quite extensive but there is no palpable bone. No evidence of surrounding infection. There may be some improvement in the undermining measurements We are still dealing with the additional wound we identified 2 weeks ago on the right fibular head as well we have been using silver alginate here 9/14; 1 month follow-up. She has now had the wound VAC on for probably 2 months. No major change here if she still has the tunneling area at 12:00 and undermining from about 6-12. Surface of the wound does not look healthy. We use MolecuLight to look at the wound surface which actually looked negative however she has  extensive immunofluorescence in the wound edge from about 12-6 o'clock this actually extends beyond the visible wound margin by quite amount. 9/27; no major change in the left buttock wound. There is still undermining however no exposed bone. Granulation looks reasonable. Right fibular head is much smaller We are using Hydrofera Blue on the right fibular head silver alginate on the buttock area 10/12; left buttock wound is large with undermining. I think this is about the same as last time granulation looks reasonable there is no exposed bone. The area on the fibular head is almost completely closed 11/30; left buttock wound large wound with undermining superiorly. As her daughter points out there is some change in the granulation superiorly and more darker red color also some raised areas of hypergranulation. I cannot tell that the area is infected. We are using silver alginate she is changing this daily On the right fibular head the area is just about closed 12/28; the patient's wound over her right fibular head is closed. Her left buttock ulcer looks actually a little better. Measurements are slightly improved. There is no exposed bone. The patient's daughter came in asking me about more aggressive options to attempt to get closure of the subcu wound including surgery. I thought we had  mostly agreed that this would be a palliative type setting and that I did not expect this to actually close nevertheless I tried to answer her questions. I do not think the patient is a candidate for a myocutaneous flap. She is simply too frail and I doubt plastic surgery would consider her a viable candidate. She might be a candidate for an advanced treatment option like Oasis which sometimes can stimulate granulation to get these wounds to gradually close. I told her in order for this to make sense she would need probably a Foley catheter to control the incontinence and unfortunately weekly visits which would be  difficult on this patient 1/25; 1 month follow-up. The patient has a large stage IV left buttock ulcer. Substantially deep but no exposed bone. She has advanced dementia. I do not think she is a candidate for anything aggressive and I follow her on a palliative basis. I been over this with her daughter many times. We have been using silver alginate backing ABDs. She has a level 3 surface on her mattress in spite of this she developed some degree of erythema today on the upper left pelvis 2/22; 1 month follow-up. Large left buttock pressure ulcer. We've been using silver alginate is a palliative dressing. Essentially deep wound with no exposed bone. No evidence of surrounding infection. This patient has advanced/preterminal dementia. Her daughter reports that he had a time where she was not eating very well although she seems to be improved lately. she does not appear to be systemically unwell Electronic Signature(s) Signed: 06/30/2021 4:06:18 PM By: Linton Ham MD Entered By: Linton Ham on 06/30/2021 10:46:22 -------------------------------------------------------------------------------- Physical Exam Details Patient Name: Date of Service: ANNICE, JOLLY 06/30/2021 9:45 A M Medical Record Number: [id number] Patient Account Number: [id number] Date of Birth/Sex: Treating RN: 1936-03-02 (86 y.o. [name] Primary Care Provider: [name] Other Clinician: Referring Provider: Treating Provider/Extender: Deloria Lair in Treatment: 70 Constitutional Patient is hypertensive.. Pulse regular and within target range for patient.Marland Kitchen Respirations regular, non-labored and within target range.. Temperature is normal and within the target range for the patient.Marland Kitchen Appears in no distress. Psychiatric advanced dementia and essentially nonverbal. Notes wound exam; left buttock. Substantially large wound but no exposed initial tuberosity. There is undermining.  Loose tissue around the wound makes the wound bed easy to examine. There is some debris on the surface. I have not been debriding these wounds. Soft tissue palpation no crepitus no tenderness. She does not appear to be dehydrated although she is lost a considerable amount of weight with loose soft tissue Electronic Signature(s) Signed: 06/30/2021 4:06:18 PM By: Linton Ham MD Entered By: Linton Ham on 06/30/2021 10:48:45 -------------------------------------------------------------------------------- Physician Orders Details Patient Name: Date of Service: [name] 06/30/2021 9:45 A M Medical Record Number: [id number] Patient Account Number: [id number] Date of Birth/Sex: Treating RN: 1936-05-02 (86 y.o. [name] Primary Care Provider: [name] Other Clinician: Referring Provider: Treating Provider/Extender: Deloria Lair in Treatment: 38 Verbal / Phone Orders: No Diagnosis Coding Follow-up Appointments Return appointment in 1 month. - with Dr. Celine Ahr Bathing/ Shower/ Hygiene May shower and wash wound with soap and water. - prior to dressing change Off-Loading Low air-loss mattress (Group 2) Turn and reposition every 2 hours Additional Orders / Instructions Follow Nutritious Diet - 100-120g of Protein Home Health No change in wound care orders this week; continue Home Health for wound care. May utilize formulary equivalent dressing for wound treatment orders unless otherwise  specified. Other Home Health Orders/Instructions: - [facility] Wound Treatment Wound #2 - Gluteus Wound Laterality: Left Cleanser: Soap and Water (Home Health) 1 x Per Day/30 Days Discharge Instructions: May shower and wash wound with dial antibacterial soap and water prior to dressing change. Prim Dressing: KerraCel Ag Gelling Fiber Dressing, 4x5 in (silver alginate) (Home Health) 1 x Per Day/30 Days ary Discharge Instructions: Apply silver alginate to wound  bed as instructed Secondary Dressing: Woven Gauze Sponge, Non-Sterile 4x4 in (Home Health) 1 x Per Day/30 Days Discharge Instructions: Dry gauze backing over alginate Secondary Dressing: ABD Pad, 8x10 (Home Health) 1 x Per Day/30 Days Discharge Instructions: Apply over primary dressing as directed. Secured With: 52M Medipore H Soft Cloth Surgical T 4 x 2 (in/yd) (Home Health) 1 x Per Day/30 Days ape Discharge Instructions: Secure dressing with tape as directed. Electronic Signature(s) Signed: 06/30/2021 12:06:08 PM By: Dellie Catholic RN Signed: 06/30/2021 4:06:18 PM By: Linton Ham MD Entered By: Dellie Catholic on 06/30/2021 10:43:56 -------------------------------------------------------------------------------- Problem List Details Patient Name: Date of Service: SHULAMIS, WENBERG 06/30/2021 9:45 A M Medical Record Number: [id number] Patient Account Number: [id number] Date of Birth/Sex: Treating RN: 12-Jan-1936 (86 y.o. [name] Primary Care Provider: [name] Other Clinician: Referring Provider: Treating Provider/Extender: Deloria Lair in Treatment: 14 Active Problems ICD-10 Encounter Code Description Active Date MDM Diagnosis L89.324 Pressure ulcer of left buttock, stage 4 11/24/2020 No Yes E11.9 Type 2 diabetes mellitus without complications 10/08/8364 No Yes Inactive Problems ICD-10 Code Description Active Date Inactive Date L89.320 Pressure ulcer of left buttock, unstageable 10/13/2020 10/13/2020 L89.93 Pressure ulcer of unspecified site, stage 3 12/23/2020 12/23/2020 Resolved Problems Electronic Signature(s) Signed: 06/30/2021 4:06:18 PM By: Linton Ham MD Entered By: Linton Ham on 06/30/2021 10:43:51 -------------------------------------------------------------------------------- Progress Note Details Patient Name: Date of Service: [name] 06/30/2021 9:45 A M Medical Record Number: [id number] Patient Account Number:  [id number] Date of Birth/Sex: Treating RN: 08-29-1935 (86 y.o. [name] Primary Care Provider: [name] Other Clinician: Referring Provider: Treating Provider/Extender: Deloria Lair in Treatment: 38 Subjective History of Present Illness (HPI) Admission 5/27 Ms. Allanna Bresee is an 86 year old female with a past medical history of type 2 diabetes on oral agents, hypertension and dementia that presents today to the clinic for a wound to her right buttocks. Her daughter-in-law is present today and helps provide the history. This issue has been going on for 2 to 3 weeks now. She states she developed an eschar about 1 week ago. She has been taking doxycycline for possible soft tissue infection to the area prescribed by her primary care provider. Patient currently denies any signs of infection. 6/3; patient admitted to the clinic last week. She has a large necrotic area on her left buttock. They use Santyl last week. On arrival this visit she had completely necrotic surface with open to subcutaneous tissue. The surface was completely nonviable. They have been using Santyl. She apparently had been on doxycycline which she is just finishing prescribed by her primary doctor for possible soft tissue infection 6/7; patient with a large necrotic wound over her left buttock. Aggressive debridement last week. Culture of this grew Proteus unfortunately would not of been covered well or at least predictably by the doxycycline that her primary doctor put her on. I have therefore put her on Augmentin suspension 3 times a day. We are using silver alginate as the primary dressing while we deal with the infection. Her daughter has a list of  concerns including swallowing difficulties, concerns about aspiration. The patient has advanced dementia. If this is Alzheimer's disease it is at its preterminal stages. I went over that swallowing difficulties are part of what happens in  the preterminal stages of Alzheimer's disease. Nevertheless we will family members seems to want to pursue a very aggressive course 6/21 large wound over her left buttock. She has completed the antibiotics I gave her [Augmentin]. We are using silver alginate while we are dealing with the infection and also to help with the drainage In general the wound looks better she has healthy granulation over perhaps 70% of it. Superiorly there is still necrotic debris I did not attempt to debride this today The patient has advanced dementia. I do not know that she could tolerate a wound VAC. She also has double incontinence which would make that difficult. Nevertheless she has been cared for diligently by her family. She is apparently eating well and may be 2 to 3 hours on this area all day 7/5; 2-week follow-up. She continues with a nice improvement in overall condition of the wound bed. The undermining area has still some adherent surface slough. We have been using silver alginate because of the drainage and possibility of at least superficial wound infection/colonization. Although I said in previous notes I wondered whether she could tolerate the wound VAC they have done so well with this wound at home I do not want to necessarily rule her out for a wound VAC and I am going to direct the start of a trial of wound VAC therapy if this can be covered by insurance, if we have home health support to change it at all 7/19; we did not get very far with the wound VAC because my original ICD 10 said this was unstageable. As usual this represents a considerable frustration because we would not of ordered a wound VAC if the wound was still unstageable. This currently represents a stage IV staging. This is all the way down through muscle layer. There is a rim of tissue over the bone and no palpable exposed bone but this is a deep wound. Fortunately at present no evidence of infection. We have been using silver collagen  with backing wet-to-dry. The wound is really no better today but no worse 8/2; patient presents for 2-week follow-up. She has been using the wound VAC for the past week. There has been some issues with keeping the drape in place however home health comes out to reapply the drape when this happens. Overall there are no issues or complaints today. No signs of infection. 8/17; patient has been using the wound VAC on her wound on the upper left buttock. About 50 to 60% of this area is fully granulated however from about 10-5 o'clock there is still undermining. UNFORTUNATELY she also arrives in clinic today with a wound over her right fibular head. According to her daughter has been there for about 2 weeks. She wonders whether there is an abrasion although no concrete history of this. Given her frail status this is probably most likely a pressure ulcer over a bony prominence [fibular head] 8/31 unfortunately the upper left buttock pressure ulcer stage IV is not as optimistically better as I was hoping. The granulation that I identified on 12/23/20 is no longer adherent at the inferior pole the undermining is quite extensive but there is no palpable bone. No evidence of surrounding infection. There may be some improvement in the undermining measurements We are still dealing with  the additional wound we identified 2 weeks ago on the right fibular head as well we have been using silver alginate here 9/14; 1 month follow-up. She has now had the wound VAC on for probably 2 months. No major change here if she still has the tunneling area at 12:00 and undermining from about 6-12. Surface of the wound does not look healthy. We use MolecuLight to look at the wound surface which actually looked negative however she has extensive immunofluorescence in the wound edge from about 12-6 o'clock this actually extends beyond the visible wound margin by quite amount. 9/27; no major change in the left buttock wound. There is  still undermining however no exposed bone. Granulation looks reasonable. oo Right fibular head is much smaller We are using Hydrofera Blue on the right fibular head silver alginate on the buttock area 10/12; left buttock wound is large with undermining. I think this is about the same as last time granulation looks reasonable there is no exposed bone. The area on the fibular head is almost completely closed 11/30; left buttock wound large wound with undermining superiorly. As her daughter points out there is some change in the granulation superiorly and more darker red color also some raised areas of hypergranulation. I cannot tell that the area is infected. We are using silver alginate she is changing this daily On the right fibular head the area is just about closed 12/28; the patient's wound over her right fibular head is closed. Her left buttock ulcer looks actually a little better. Measurements are slightly improved. There is no exposed bone. The patient's daughter came in asking me about more aggressive options to attempt to get closure of the subcu wound including surgery. I thought we had mostly agreed that this would be a palliative type setting and that I did not expect this to actually close nevertheless I tried to answer her questions. I do not think the patient is a candidate for a myocutaneous flap. She is simply too frail and I doubt plastic surgery would consider her a viable candidate. She might be a candidate for an advanced treatment option like Oasis which sometimes can stimulate granulation to get these wounds to gradually close. I told her in order for this to make sense she would need probably a Foley catheter to control the incontinence and unfortunately weekly visits which would be difficult on this patient 1/25; 1 month follow-up. The patient has a large stage IV left buttock ulcer. Substantially deep but no exposed bone. She has advanced dementia. I do not think she is a  candidate for anything aggressive and I follow her on a palliative basis. I been over this with her daughter many times. We have been using silver alginate backing ABDs. She has a level 3 surface on her mattress in spite of this she developed some degree of erythema today on the upper left pelvis 2/22; 1 month follow-up. Large left buttock pressure ulcer. We've been using silver alginate is a palliative dressing. Essentially deep wound with no exposed bone. No evidence of surrounding infection. This patient has advanced/preterminal dementia. Her daughter reports that he had a time where she was not eating very well although she seems to be improved lately. she does not appear to be systemically unwell Objective Constitutional Patient is hypertensive.. Pulse regular and within target range for patient.Marland Kitchen Respirations regular, non-labored and within target range.. Temperature is normal and within the target range for the patient.Marland Kitchen Appears in no distress. Vitals Time Taken: 10:11  AM, Height: 68 in, Weight: 110 lbs, BMI: 16.7, Temperature: 98.2 F, Pulse: 71 bpm, Respiratory Rate: 16 breaths/min, Blood Pressure: 200/84 mmHg. Psychiatric advanced dementia and essentially nonverbal. General Notes: wound exam; left buttock. Substantially large wound but no exposed initial tuberosity. There is undermining. Loose tissue around the wound makes the wound bed easy to examine. There is some debris on the surface. I have not been debriding these wounds. Soft tissue palpation no crepitus no tenderness. She does not appear to be dehydrated although she is lost a considerable amount of weight with loose soft tissue Integumentary (Hair, Skin) Wound #2 status is Open. Original cause of wound was Pressure Injury. The date acquired was: 09/18/2020. The wound has been in treatment 38 weeks. The wound is located on the Left Gluteus. The wound measures 5cm length x 4.6cm width x 2cm depth; 18.064cm^2 area and 36.128cm^3  volume. There is Fat Layer (Subcutaneous Tissue) exposed. There is no tunneling noted, however, there is undermining starting at 12:00 and ending at 12:00 with a maximum distance of 2.6cm. There is a medium amount of serosanguineous drainage noted. The wound margin is epibole. There is medium (34-66%) red, pink granulation within the wound bed. There is a medium (34-66%) amount of necrotic tissue within the wound bed including Adherent Slough. Assessment Active Problems ICD-10 Pressure ulcer of left buttock, stage 4 Type 2 diabetes mellitus without complications Plan Follow-up Appointments: Return appointment in 1 month. - with Dr. Celine Ahr Bathing/ Shower/ Hygiene: May shower and wash wound with soap and water. - prior to dressing change Off-Loading: Low air-loss mattress (Group 2) Turn and reposition every 2 hours Additional Orders / Instructions: Follow Nutritious Diet - 100-120g of Protein Home Health: No change in wound care orders this week; continue Home Health for wound care. May utilize formulary equivalent dressing for wound treatment orders unless otherwise specified. Other Home Health Orders/Instructions: - [facility] WOUND #2: - Gluteus Wound Laterality: Left Cleanser: Soap and Water (Home Health) 1 x Per Day/30 Days Discharge Instructions: May shower and wash wound with dial antibacterial soap and water prior to dressing change. Prim Dressing: KerraCel Ag Gelling Fiber Dressing, 4x5 in (silver alginate) (Home Health) 1 x Per Day/30 Days ary Discharge Instructions: Apply silver alginate to wound bed as instructed Secondary Dressing: Woven Gauze Sponge, Non-Sterile 4x4 in (Home Health) 1 x Per Day/30 Days Discharge Instructions: Dry gauze backing over alginate Secondary Dressing: ABD Pad, 8x10 (Home Health) 1 x Per Day/30 Days Discharge Instructions: Apply over primary dressing as directed. Secured With: 19M Medipore H Soft Cloth Surgical T 4 x 2 (in/yd) (Home Health) 1 x Per  Day/30 Days ape Discharge Instructions: Secure dressing with tape as directed. #1 I'm contiinue with silver alginate #2 this is a palliative visit the family appears to want to continue. I've been seeing her on a monthly basis. #3 I expect at some point in the not too distant future she will likely have difficulties with oral intake and then substantially intake of fluids which usually for tells the terminal stages of this disease. I would recommend hospice at that point have talked to this daughter-in-law about this in the past. This wound is not going to heal and she seems to understand that but wishes to bring her back here for our review. She also has weekly home health Electronic Signature(s) Signed: 06/30/2021 4:06:18 PM By: Linton Ham MD Entered By: Linton Ham on 06/30/2021 10:50:32 -------------------------------------------------------------------------------- SuperBill Details Patient Name: Date of Service: [name] 06/30/2021 Medical  Record Number: [id number] Patient Account Number: [id number] Date of Birth/Sex: Treating RN: Nov 12, 1935 (86 y.o. [name] Primary Care Provider: [name] Other Clinician: Referring Provider: Treating Provider/Extender: Deloria Lair in Treatment: 38 Diagnosis Coding ICD-10 Codes Code Description [phone] Pressure ulcer of left buttock, stage 4 E11.9 Type 2 diabetes mellitus without complications Facility Procedures CPT4 Code: 85927639 Description: 43200 - WOUND CARE VISIT-LEV 3 EST PT Modifier: Quantity: 1 Physician Procedures Electronic Signature(s) Signed: 06/30/2021 12:06:08 PM By: Dellie Catholic RN Signed: 06/30/2021 4:06:18 PM By: Linton Ham MD Entered By: Dellie Catholic on 06/30/2021 12:04:06

## 2021-07-05 ENCOUNTER — Ambulatory Visit: Payer: Medicare Other | Admitting: Nurse Practitioner

## 2021-07-05 DIAGNOSIS — E785 Hyperlipidemia, unspecified: Secondary | ICD-10-CM | POA: Diagnosis not present

## 2021-07-05 DIAGNOSIS — E1140 Type 2 diabetes mellitus with diabetic neuropathy, unspecified: Secondary | ICD-10-CM | POA: Diagnosis not present

## 2021-07-05 DIAGNOSIS — L89320 Pressure ulcer of left buttock, unstageable: Secondary | ICD-10-CM | POA: Diagnosis not present

## 2021-07-05 DIAGNOSIS — L89893 Pressure ulcer of other site, stage 3: Secondary | ICD-10-CM | POA: Diagnosis not present

## 2021-07-05 DIAGNOSIS — F0390 Unspecified dementia without behavioral disturbance: Secondary | ICD-10-CM | POA: Diagnosis not present

## 2021-07-05 DIAGNOSIS — I890 Lymphedema, not elsewhere classified: Secondary | ICD-10-CM | POA: Diagnosis not present

## 2021-07-07 ENCOUNTER — Other Ambulatory Visit: Payer: Self-pay | Admitting: Nurse Practitioner

## 2021-07-07 DIAGNOSIS — Z86718 Personal history of other venous thrombosis and embolism: Secondary | ICD-10-CM

## 2021-07-13 DIAGNOSIS — I890 Lymphedema, not elsewhere classified: Secondary | ICD-10-CM | POA: Diagnosis not present

## 2021-07-13 DIAGNOSIS — L89320 Pressure ulcer of left buttock, unstageable: Secondary | ICD-10-CM | POA: Diagnosis not present

## 2021-07-13 DIAGNOSIS — L89893 Pressure ulcer of other site, stage 3: Secondary | ICD-10-CM | POA: Diagnosis not present

## 2021-07-13 DIAGNOSIS — E785 Hyperlipidemia, unspecified: Secondary | ICD-10-CM | POA: Diagnosis not present

## 2021-07-13 DIAGNOSIS — F0390 Unspecified dementia without behavioral disturbance: Secondary | ICD-10-CM | POA: Diagnosis not present

## 2021-07-13 DIAGNOSIS — E1140 Type 2 diabetes mellitus with diabetic neuropathy, unspecified: Secondary | ICD-10-CM | POA: Diagnosis not present

## 2021-07-13 NOTE — Progress Notes (Signed)
[facility] RN NOTE  PATIENT NAME: [name] DOB: 07-25-1935 MRN: [id number]  PRIMARY CARE PROVIDER: [name], NP  RESPONSIBLE PARTY: Sofie Rower (daughter in law) Acct ID - Guarantor Home Phone Work Phone Relationship Acct Type  [id number] JINI, [id number] [phone]  Self P/F     [address], [place], [place] [id number]   Due to the COVID-19 crisis, this virtual check-in visit was done via telephone from my office and it was initiated and consent by this patient and or family.  RN joint telephonic encounter completed with patient's daughter-in-law-[name]. Patient remains dependent for all ADLs. She has an open sacral wound and going to wound care every 6 weeks. She also continues with home health RN visiting weekly for wound care. She continues with protein shakes for nutritional supplementation. She continues to monitor her blood pressure and blood sugars daily and keeps records of this. She is receiving PT privately for strengthening and range of motion. [name] inquired about respite for patient as she wants to take a trip with her daughter. SW provided information as requested. Palliative care will continue to follow.    (Duration of visit and documentation 30 minutes)   Daryl Eastern, RN BSN

## 2021-07-21 DIAGNOSIS — I890 Lymphedema, not elsewhere classified: Secondary | ICD-10-CM | POA: Diagnosis not present

## 2021-07-21 DIAGNOSIS — E785 Hyperlipidemia, unspecified: Secondary | ICD-10-CM | POA: Diagnosis not present

## 2021-07-21 DIAGNOSIS — F0390 Unspecified dementia without behavioral disturbance: Secondary | ICD-10-CM | POA: Diagnosis not present

## 2021-07-21 DIAGNOSIS — L89893 Pressure ulcer of other site, stage 3: Secondary | ICD-10-CM | POA: Diagnosis not present

## 2021-07-21 DIAGNOSIS — L89320 Pressure ulcer of left buttock, unstageable: Secondary | ICD-10-CM | POA: Diagnosis not present

## 2021-07-21 DIAGNOSIS — E1140 Type 2 diabetes mellitus with diabetic neuropathy, unspecified: Secondary | ICD-10-CM | POA: Diagnosis not present

## 2021-07-22 ENCOUNTER — Other Ambulatory Visit: Payer: Self-pay

## 2021-07-22 ENCOUNTER — Telehealth: Payer: Medicare Other | Admitting: Family Medicine

## 2021-07-26 DIAGNOSIS — F0390 Unspecified dementia without behavioral disturbance: Secondary | ICD-10-CM | POA: Diagnosis not present

## 2021-07-26 DIAGNOSIS — I10 Essential (primary) hypertension: Secondary | ICD-10-CM | POA: Diagnosis not present

## 2021-07-26 DIAGNOSIS — Z741 Need for assistance with personal care: Secondary | ICD-10-CM | POA: Diagnosis not present

## 2021-07-26 DIAGNOSIS — Z7901 Long term (current) use of anticoagulants: Secondary | ICD-10-CM | POA: Diagnosis not present

## 2021-07-26 DIAGNOSIS — K5900 Constipation, unspecified: Secondary | ICD-10-CM | POA: Diagnosis not present

## 2021-07-26 DIAGNOSIS — Z7984 Long term (current) use of oral hypoglycemic drugs: Secondary | ICD-10-CM | POA: Diagnosis not present

## 2021-07-26 DIAGNOSIS — Z86711 Personal history of pulmonary embolism: Secondary | ICD-10-CM | POA: Diagnosis not present

## 2021-07-26 DIAGNOSIS — E1140 Type 2 diabetes mellitus with diabetic neuropathy, unspecified: Secondary | ICD-10-CM | POA: Diagnosis not present

## 2021-07-26 DIAGNOSIS — L89892 Pressure ulcer of other site, stage 2: Secondary | ICD-10-CM | POA: Diagnosis not present

## 2021-07-26 DIAGNOSIS — M6281 Muscle weakness (generalized): Secondary | ICD-10-CM | POA: Diagnosis not present

## 2021-07-26 DIAGNOSIS — E785 Hyperlipidemia, unspecified: Secondary | ICD-10-CM | POA: Diagnosis not present

## 2021-07-26 DIAGNOSIS — Z8673 Personal history of transient ischemic attack (TIA), and cerebral infarction without residual deficits: Secondary | ICD-10-CM | POA: Diagnosis not present

## 2021-07-26 DIAGNOSIS — I890 Lymphedema, not elsewhere classified: Secondary | ICD-10-CM | POA: Diagnosis not present

## 2021-07-26 DIAGNOSIS — L89320 Pressure ulcer of left buttock, unstageable: Secondary | ICD-10-CM | POA: Diagnosis not present

## 2021-07-26 DIAGNOSIS — Z86718 Personal history of other venous thrombosis and embolism: Secondary | ICD-10-CM | POA: Diagnosis not present

## 2021-07-28 ENCOUNTER — Other Ambulatory Visit: Payer: Self-pay

## 2021-07-28 ENCOUNTER — Encounter (HOSPITAL_BASED_OUTPATIENT_CLINIC_OR_DEPARTMENT_OTHER): Payer: Medicare Other | Attending: Internal Medicine | Admitting: General Surgery

## 2021-07-28 DIAGNOSIS — L89323 Pressure ulcer of left buttock, stage 3: Secondary | ICD-10-CM | POA: Diagnosis not present

## 2021-07-28 DIAGNOSIS — L89226 Pressure-induced deep tissue damage of left hip: Secondary | ICD-10-CM | POA: Diagnosis not present

## 2021-07-28 DIAGNOSIS — E785 Hyperlipidemia, unspecified: Secondary | ICD-10-CM | POA: Diagnosis not present

## 2021-07-28 DIAGNOSIS — L89896 Pressure-induced deep tissue damage of other site: Secondary | ICD-10-CM | POA: Diagnosis not present

## 2021-07-28 DIAGNOSIS — L89324 Pressure ulcer of left buttock, stage 4: Secondary | ICD-10-CM | POA: Diagnosis not present

## 2021-07-28 DIAGNOSIS — E11622 Type 2 diabetes mellitus with other skin ulcer: Secondary | ICD-10-CM | POA: Insufficient documentation

## 2021-07-28 DIAGNOSIS — I10 Essential (primary) hypertension: Secondary | ICD-10-CM | POA: Diagnosis not present

## 2021-07-28 NOTE — Progress Notes (Signed)
Thomasenia Bottoms ([id number]) ?, ?Visit Report for 07/28/2021 ?Chief Complaint Document Details ?Patient Name: Date of Service: ?[name], Sarai 07/28/2021 9:30 A M ?Medical Record Number: [id number] ?Patient Account Number: [id number] ?Date of Birth/Sex: Treating RN: ?Sep 30, 1935 (86 y.o. F) ?Primary Care Provider: [name] Other Clinician: ?Referring Provider: ?Treating Provider/Extender: Fredirick Maudlin ?[name] ?Weeks in Treatment: 42 ?Information Obtained from: Patient ?Chief Complaint ?Right buttocks pressure ulcer ?Electronic Signature(s) ?Signed: 07/28/2021 10:41:29 AM By: Fredirick Maudlin MD FACS ?Entered By: Fredirick Maudlin on 07/28/2021 10:41:29 ?-------------------------------------------------------------------------------- ?Debridement Details ?Patient Name: Date of Service: ?[name], Camyah 07/28/2021 9:30 A M ?Medical Record Number: [id number] ?Patient Account Number: [id number] ?Date of Birth/Sex: Treating RN: ?Mar 09, 1936 (86 y.o. F) [place]ton, Mechele Claude ?Primary Care Provider: [name] Other Clinician: ?Referring Provider: ?Treating Provider/Extender: Fredirick Maudlin ?[name] ?Weeks in Treatment: 42 ?Debridement Performed for Assessment: Wound #2 Left Gluteus ?Performed By: Physician Fredirick Maudlin, MD ?Debridement Type: Debridement ?Level of Consciousness (Pre-procedure): Awake and Alert ?Pre-procedure Verification/Time Out Yes - 10:15 ?Taken: ?Start Time: 10:15 ?Pain Control: ?Other : benzocaine ?T Area Debrided (L x W): ?otal 4.5 (cm) x 3.5 (cm) = 15.75 (cm?) ?Tissue and other material debrided: Non-Viable, [place], [place] ?Level: Non-Viable Tissue ?Debridement Description: Selective/Open Wound ?Instrument: Curette ?Bleeding: Minimum ?Hemostasis Achieved: Pressure ?End Time: 10:17 ?Procedural Pain: 0 ?Post Procedural Pain: 0 ?Response to Treatment: Procedure was tolerated well ?Level of Consciousness (Post- Awake and Alert ?procedure): ?Post Debridement Measurements of Total  Wound ?Length: (cm) 4.5 ?Stage: Category/Stage III ?Width: (cm) 3.5 ?Depth: (cm) 2 ?Volume: (cm?) 24.74 ?Character of Wound/Ulcer Post Debridement: Improved ?Post Procedure Diagnosis ?Same as Pre-procedure ?Electronic Signature(s) ?Signed: 07/28/2021 3:04:13 PM By: Fredirick Maudlin MD FACS ?Signed: 07/28/2021 5:30:22 PM By: Dellie Catholic RN ?Entered By: Dellie Catholic on 07/28/2021 10:32:44 ?-------------------------------------------------------------------------------- ?HPI Details ?Patient Name: Date of Service: ?[name], Sherl 07/28/2021 9:30 A M ?Medical Record Number: [id number] ?Patient Account Number: [id number] ?Date of Birth/Sex: Treating RN: ?02-26-1936 (86 y.o. F) ?Primary Care Provider: [name] Other Clinician: ?Referring Provider: ?Treating Provider/Extender: Fredirick Maudlin ?[name] ?Weeks in Treatment: 42 ?History of Present Illness ?HPI Description: Admission 5/27 ?Ms. Orrie Lascano is an 86 year old female with a past medical history of type 2 diabetes on oral agents, hypertension and dementia that presents today to the ?clinic for a wound to her right buttocks. Her daughter-in-law is present today and helps provide the history. This issue has been going on for 2 to 3 weeks now. ?She states she developed an eschar about 1 week ago. She has been taking doxycycline for possible soft tissue infection to the area prescribed by her ?primary care provider. Patient currently denies any signs of infection. ?6/3; patient admitted to the clinic last week. She has a large necrotic area on her left buttock. They use Santyl last week. On arrival this visit she had ?completely necrotic surface with open to subcutaneous tissue. The surface was completely nonviable. They have been using Santyl. She apparently had been ?on doxycycline which she is just finishing prescribed by her primary doctor for possible soft tissue infection ?6/7; patient with a large necrotic wound over her left buttock.  Aggressive debridement last week. Culture of this grew Proteus unfortunately would not of been ?covered well or at least predictably by the doxycycline that her primary doctor put her on. I have therefore put her on Augmentin suspension 3 times a day. ?We are using silver alginate as the primary dressing while we deal with the infection. ?Her daughter has a list of concerns including swallowing  difficulties, concerns about aspiration. The patient has advanced dementia. If this is Alzheimer's ?disease it is at its preterminal stages. I went over that swallowing difficulties are part of what happens in the preterminal stages of Alzheimer's disease. ?Nevertheless we will family members seems to want to pursue a very aggressive course ?6/21 large wound over her left buttock. She has completed the antibiotics I gave her [Augmentin]. We are using silver alginate while we are dealing with the ?infection and also to help with the drainage ?In general the wound looks better she has healthy granulation over perhaps 70% of it. Superiorly there is still necrotic debris I did not attempt to debride this ?today ?The patient has advanced dementia. I do not know that she could tolerate a wound VAC. She also has double incontinence which would make that difficult. ?Nevertheless she has been cared for diligently by her family. She is apparently eating well and may be 2 to 3 hours on this area all day ?7/5; 2-week follow-up. She continues with a nice improvement in overall condition of the wound bed. The undermining area has still some adherent surface ?slough. We have been using silver alginate because of the drainage and possibility of at least superficial wound infection/colonization. ?Although I said in previous notes I wondered whether she could tolerate the wound VAC they have done so well with this wound at home I do not want to ?necessarily rule her out for a wound VAC and I am going to direct the start of a trial of wound VAC  therapy if this can be covered by insurance, if we have ?home health support to change it at all ?7/19; we did not get very far with the wound VAC because my original ICD 10 said this was unstageable. As usual this represents a considerable frustration ?because we would not of ordered a wound VAC if the wound was still unstageable. This currently represents a stage IV staging. This is all the way down through ?muscle layer. There is a rim of tissue over the bone and no palpable exposed bone but this is a deep wound. Fortunately at present no evidence of infection. ?We have been using silver collagen with backing wet-to-dry. The wound is really no better today but no worse ?8/2; patient presents for 2-week follow-up. She has been using the wound VAC for the past week. There has been some issues with keeping the drape in place ?however home health comes out to reapply the drape when this happens. Overall there are no issues or complaints today. No signs of infection. ?8/17; patient has been using the wound VAC on her wound on the upper left buttock. About 50 to 60% of this area is fully granulated however from about 10-5 ?o'clock there is still undermining. ?UNFORTUNATELY she also arrives in clinic today with a wound over her right fibular head. According to her daughter has been there for about 2 weeks. She ?wonders whether there is an abrasion although no concrete history of this. Given her frail status this is probably most likely a pressure ulcer over a bony ?prominence [fibular head] ?8/31 unfortunately the upper left buttock pressure ulcer stage IV is not as optimistically better as I was hoping. The granulation that I identified on 12/23/20 is no ?longer adherent at the inferior pole the undermining is quite extensive but there is no palpable bone. No evidence of surrounding infection. There may be some ?improvement in the undermining measurements ?We are still dealing with the additional wound we  identified 2  weeks ago on the right fibular head as well we have been using silver alginate here ?9/14; 1 month follow-up. She has now had the wound VAC on for probably 2 months. No major change here if she still has the tu

## 2021-07-28 NOTE — Progress Notes (Signed)
[name] ([id number]) ?, ?Visit Report for 07/28/2021 ?Arrival Information Details ?Patient Name: Date of Service: ?BRYA Contreras, Carolyn 07/28/2021 9:30 A M ?Medical Record Number: [id number] ?Patient Account Number: [id number] ?Date of Birth/Sex: Treating RN: ?09-15-1935 (86 y.o. F) Sharyn Creamer ?Primary Care [name] Darko: [name] Other Clinician: ?Referring Suman Trivedi: ?Treating Khaleel Beckom/Extender: Fredirick Maudlin ?[name] ?Weeks in Treatment: 42 ?Visit Information History Since Last Visit ?Added or deleted any medications: No ?Patient Arrived: Wheel Chair ?Any new allergies or adverse reactions: No ?Arrival Time: 09:41 ?Had a fall or experienced change in No ?Accompanied By: caregiver ?activities of daily living that may affect ?Transfer Assistance: EasyP[employer] Patient Lift ?risk of falls: ?Patient Identification Verified: Yes ?Signs or symptoms of abuse/neglect since last visito No ?Secondary Verification Process Completed: Yes ?Hospitalized since last visit: No ?Patient Requires Transmission-Based Precautions: No ?Implantable device outside of the clinic excluding No ?Patient Has Alerts: No ?cellular tissue based products placed in the center ?since last visit: ?Has Dressing in Place as Prescribed: Yes ?Pain Present Now: No ?Electronic Signature(s) ?Signed: 07/28/2021 4:21:04 PM By: Sharyn Creamer RN, BSN ?Entered By: Sharyn Creamer on 07/28/2021 09:42:03 ?-------------------------------------------------------------------------------- ?Encounter Discharge Information Details ?Patient Name: Date of Service: ?BRYA Contreras, Carolyn 07/28/2021 9:30 A M ?Medical Record Number: [id number] ?Patient Account Number: [id number] ?Date of Birth/Sex: Treating RN: ?1935/11/16 (86 y.o. F) [place], Mechele Claude ?Primary Care Cassius Cullinane: [name] Other Clinician: ?Referring Nelta Caudill: ?Treating Maziyah Vessel/Extender: Fredirick Maudlin ?[name] ?Weeks in Treatment: 42 ?Encounter Discharge Information Items Post Procedure  Vitals ?Discharge Condition: Stable ?Temperature (F): 97.5 ?Ambulatory Status: Wheelchair ?Pulse (bpm): 71 ?Discharge Destination: Home ?Respiratory Rate (breaths/min): 16 ?Transportation: Private Auto ?Blood Pressure (mmHg): 194/84 ?Accompanied By: [name] ?Schedule Follow-up Appointment: Yes ?Clinical Summary of Care: ?Electronic Signature(s) ?Signed: 07/28/2021 5:30:22 PM By: Dellie Catholic RN ?Entered By: Dellie Catholic on 07/28/2021 17:22:51 ?-------------------------------------------------------------------------------- ?Lower Extremity Assessment Details ?Patient Name: ?Date of Service: ?BRYA Contreras, Carolyn 07/28/2021 9:30 A M ?Medical Record Number: [id number] ?Patient Account Number: [id number] ?Date of Birth/Sex: ?Treating RN: ?Sep 12, 1935 (86 y.o. F) [place], Mechele Claude ?Primary Care Traevion Poehler: [name] ?Other Clinician: ?Referring Sahej Hauswirth: ?Treating Jevaughn Degollado/Extender: Fredirick Maudlin ?[name] ?Weeks in Treatment: 42 ?Electronic Signature(s) ?Signed: 07/28/2021 5:30:22 PM By: Dellie Catholic RN ?Entered By: Dellie Catholic on 07/28/2021 10:06:18 ?-------------------------------------------------------------------------------- ?Multi Wound Chart Details ?Patient Name: ?Date of Service: ?BRYA Contreras, Carolyn 07/28/2021 9:30 A M ?Medical Record Number: [id number] ?Patient Account Number: [id number] ?Date of Birth/Sex: ?Treating RN: ?Sep 13, 1935 (86 y.o. F) ?Primary Care Jermichael Belmares: [name] ?Other Clinician: ?Referring Kylani Wires: ?Treating Shanitha Twining/Extender: Fredirick Maudlin ?[name] ?Weeks in Treatment: 42 ?Vital Signs ?Height(in): 68 ?Pulse(bpm): 71 ?Weight(lbs): 110 ?Blood Pressure(mmHg): 194/84 ?Body Mass Index(BMI): 16.7 ?Temperature(??F): 97.5 ?Respiratory Rate(breaths/min): 16 ?Photos: [N/A:N/A] ?Left Gluteus N/A N/A ?Wound Location: ?Pressure Injury N/A N/A ?Wounding Event: ?Pressure Ulcer N/A N/A ?Primary Etiology: ?Anemia, Deep Vein Thrombosis, N/A N/A ?Comorbid  History: ?Hypertension, Type II Diabetes, ?Dementia ?09/18/2020 N/A N/A ?Date Acquired: ?73 N/A N/A ?Weeks of Treatment: ?Open N/A N/A ?Wound Status: ?No N/A N/A ?Wound Recurrence: ?4.5x3.5x2 N/A N/A ?Measurements L x W x D (cm) ?12.37 N/A N/A ?A (cm?) : ?rea ?24.74 N/A N/A ?Volume (cm?) : ?70.30% N/A N/A ?% Reduction in A rea: ?-493.90% N/A N/A ?% Reduction in Volume: ?12 ?Starting Position 1 (o'clock): ?12 ?Ending Position 1 (o'clock): ?2 ?Maximum Distance 1 (cm): ?Yes N/A N/A ?Undermining: ?Category/Stage III N/A N/A ?Classification: ?Medium N/A N/A ?Exudate A mount: ?Serosanguineous N/A N/A ?Exudate Type: ?red, brown N/A N/A ?Exudate Color: ?Epibole N/A N/A ?Wound Margin: ?Medium (34-66%) N/A N/A ?Granulation Amount: ?  Red, Pink N/A N/A ?Granulation Quality: ?Medium (34-66%) N/A N/A ?Necrotic Amount: ?Fat Layer (Subcutaneous Tissue): Yes N/A N/A ?Exposed Structures: ?Fascia: No ?Tendon: No ?Muscle: No ?Joint: No ?Bone: No ?Medium (34-66%) N/A N/A ?Epithelialization: ?Debridement - Selective/Open Wound N/A N/A ?Debridement: ?Pre-procedure Verification/Time Out 10:15 N/A N/A ?Taken: ?Other N/A N/A ?Pain Control: ?Slough N/A N/A ?Tissue Debrided: ?Non-Viable Tissue N/A N/A ?Level: ?15.75 N/A N/A ?Debridement A (sq cm): ?rea ?Curette N/A N/A ?Instrument: ?Minimum N/A N/A ?Bleeding: ?Pressure N/A N/A ?Hemostasis A chieved: ?0 N/A N/A ?Procedural Pain: ?0 N/A N/A ?Post Procedural Pain: ?Procedure was tolerated well N/A N/A ?Debridement Treatment Response: ?4.5x3.5x2 N/A N/A ?Post Debridement Measurements L x ?W x D (cm) ?24.74 N/A N/A ?Post Debridement Volume: (cm?) ?Category/Stage III N/A N/A ?Post Debridement Stage: ?Debridement N/A N/A ?Procedures Performed: ?Treatment Notes ?Electronic Signature(s) ?Signed: 07/28/2021 10:41:19 AM By: Fredirick Maudlin MD FACS ?Entered By: Fredirick Maudlin on 07/28/2021 10:41:19 ?-------------------------------------------------------------------------------- ?Multi-Disciplinary Care  Plan Details ?Patient Name: ?Date of Service: ?BRYA Contreras, Porche 07/28/2021 9:30 A M ?Medical Record Number: [id number] ?Patient Account Number: [id number] ?Date of Birth/Sex: ?Treating RN: ?June 10, 1935 (86 y.o. F) [place], Mechele Claude ?Primary Care Marolyn Urschel: [name] ?Other Clinician: ?Referring Marcel Sorter: ?Treating Denell Cothern/Extender: Fredirick Maudlin ?[name] ?Weeks in Treatment: 42 ?Active Inactive ?Wound/Skin Impairment ?Nursing Diagnoses: ?Impaired tissue integrity ?Goals: ?Patient/caregiver will verbalize understanding of skin care regimen ?Date Initiated: 10/02/2020 ?Target Resolution Date: 08/02/2021 ?Goal Status: Active ?Ulcer/skin breakdown will have a volume reduction of 30% by week 4 ?Date Initiated: 10/02/2020 ?Date Inactivated: 01/06/2021 ?Target Resolution Date: 01/09/2021 ?Goal Status: Met ?Interventions: ?Assess patient/caregiver ability to obtain necessary supplies ?Assess patient/caregiver ability to perform ulcer/skin care regimen upon admission and as needed ?Assess ulceration(s) every visit ?Provide education on ulcer and skin care ?Treatment Activities: ?Skin care regimen initiated : 10/02/2020 ?Topical wound management initiated : 10/02/2020 ?Notes: ?11/10/20 : Goal not yet met, eschar debrided off increasing wound size. ?Electronic Signature(s) ?Signed: 07/28/2021 5:30:22 PM By: Dellie Catholic RN ?Entered By: Dellie Catholic on 07/28/2021 17:21:05 ?-------------------------------------------------------------------------------- ?Pain Assessment Details ?Patient Name: ?Date of Service: ?BRYA Contreras, Jaretssi 07/28/2021 9:30 A M ?Medical Record Number: [id number] ?Patient Account Number: [id number] ?Date of Birth/Sex: ?Treating RN: ?1935/05/13 (86 y.o. F) Sharyn Creamer ?Primary Care Kathaleen Dudziak: [name] ?Other Clinician: ?Referring Zoa Dowty: ?Treating Rhythm Gubbels/Extender: Fredirick Maudlin ?[name] ?Weeks in Treatment: 42 ?Active Problems ?Location of Pain Severity and Description of Pain ?Patient  Has Paino No ?Site Locations ?Pain Management and Medication ?Current Pain Management: ?Electronic Signature(s) ?Signed: 07/28/2021 4:21:04 PM By: Sharyn Creamer RN, BSN ?Entered By: Sharyn Creamer on 07/28/2021 09:42:48 ?----------

## 2021-07-29 DIAGNOSIS — Z20822 Contact with and (suspected) exposure to covid-19: Secondary | ICD-10-CM | POA: Diagnosis not present

## 2021-07-30 DIAGNOSIS — L89320 Pressure ulcer of left buttock, unstageable: Secondary | ICD-10-CM | POA: Diagnosis not present

## 2021-07-30 DIAGNOSIS — L89892 Pressure ulcer of other site, stage 2: Secondary | ICD-10-CM | POA: Diagnosis not present

## 2021-07-30 DIAGNOSIS — E785 Hyperlipidemia, unspecified: Secondary | ICD-10-CM | POA: Diagnosis not present

## 2021-07-30 DIAGNOSIS — I890 Lymphedema, not elsewhere classified: Secondary | ICD-10-CM | POA: Diagnosis not present

## 2021-07-30 DIAGNOSIS — F0390 Unspecified dementia without behavioral disturbance: Secondary | ICD-10-CM | POA: Diagnosis not present

## 2021-07-30 DIAGNOSIS — E1140 Type 2 diabetes mellitus with diabetic neuropathy, unspecified: Secondary | ICD-10-CM | POA: Diagnosis not present

## 2021-08-03 ENCOUNTER — Other Ambulatory Visit: Payer: Medicare Other | Admitting: Family Medicine

## 2021-08-03 ENCOUNTER — Telehealth: Payer: Medicare Other | Admitting: Family Medicine

## 2021-08-04 DIAGNOSIS — L89320 Pressure ulcer of left buttock, unstageable: Secondary | ICD-10-CM | POA: Diagnosis not present

## 2021-08-04 DIAGNOSIS — E785 Hyperlipidemia, unspecified: Secondary | ICD-10-CM | POA: Diagnosis not present

## 2021-08-04 DIAGNOSIS — E1140 Type 2 diabetes mellitus with diabetic neuropathy, unspecified: Secondary | ICD-10-CM | POA: Diagnosis not present

## 2021-08-04 DIAGNOSIS — F0390 Unspecified dementia without behavioral disturbance: Secondary | ICD-10-CM | POA: Diagnosis not present

## 2021-08-04 DIAGNOSIS — I890 Lymphedema, not elsewhere classified: Secondary | ICD-10-CM | POA: Diagnosis not present

## 2021-08-04 DIAGNOSIS — L89892 Pressure ulcer of other site, stage 2: Secondary | ICD-10-CM | POA: Diagnosis not present

## 2021-08-10 ENCOUNTER — Other Ambulatory Visit: Payer: Medicare Other | Admitting: Family Medicine

## 2021-08-10 ENCOUNTER — Telehealth: Payer: Self-pay | Admitting: *Deleted

## 2021-08-10 ENCOUNTER — Encounter: Payer: Self-pay | Admitting: Family Medicine

## 2021-08-10 DIAGNOSIS — E1142 Type 2 diabetes mellitus with diabetic polyneuropathy: Secondary | ICD-10-CM | POA: Diagnosis not present

## 2021-08-10 DIAGNOSIS — L89892 Pressure ulcer of other site, stage 2: Secondary | ICD-10-CM | POA: Diagnosis not present

## 2021-08-10 DIAGNOSIS — L98422 Non-pressure chronic ulcer of back with fat layer exposed: Secondary | ICD-10-CM | POA: Diagnosis not present

## 2021-08-10 DIAGNOSIS — L89320 Pressure ulcer of left buttock, unstageable: Secondary | ICD-10-CM | POA: Diagnosis not present

## 2021-08-10 DIAGNOSIS — E43 Unspecified severe protein-calorie malnutrition: Secondary | ICD-10-CM

## 2021-08-10 DIAGNOSIS — R1310 Dysphagia, unspecified: Secondary | ICD-10-CM | POA: Diagnosis not present

## 2021-08-10 DIAGNOSIS — Z515 Encounter for palliative care: Secondary | ICD-10-CM

## 2021-08-10 DIAGNOSIS — E1140 Type 2 diabetes mellitus with diabetic neuropathy, unspecified: Secondary | ICD-10-CM | POA: Diagnosis not present

## 2021-08-10 DIAGNOSIS — I890 Lymphedema, not elsewhere classified: Secondary | ICD-10-CM | POA: Diagnosis not present

## 2021-08-10 DIAGNOSIS — E785 Hyperlipidemia, unspecified: Secondary | ICD-10-CM | POA: Diagnosis not present

## 2021-08-10 DIAGNOSIS — F0390 Unspecified dementia without behavioral disturbance: Secondary | ICD-10-CM | POA: Diagnosis not present

## 2021-08-10 NOTE — Progress Notes (Signed)
? ? ?Manufacturing engineer ?Community Palliative Care Consult Note ?Telephone: [phone]  ?Fax: [phone]  ? ? ?Date of encounter: 08/10/21 ?11:48 AM ?PATIENT NAME: [name] ?[place] [address]   ?[phone] (home)  ?DOB: 1935-07-01 ?MRN: [id number] ?PRIMARY CARE PROVIDER:    ?[name], NP,  ?[address]. ?[place] [address] ?[phone] ? ?REFERRING PROVIDER:   ?[name], NP ?[address]. ?[place],  [place] [address] ?[phone] ? ?RESPONSIBLE PARTY:    ?Contact Information   ? ? Name Relation Home Work Mobile  ? Contreras,Carolyn Relative [phone]  [phone]  ? Carolyn, Contreras [phone]    ? ?  ? ?I connected with  [name] 's primary caregiver, [name], on 08/11/21 by a video enabled telemedicine application and verified that I am speaking with the correct person using two identifiers. I was able to see the patient as well. ?  ?I discussed the limitations of evaluation and management by telemedicine. The patient expressed understanding and agreed to proceed.  Palliative Care was asked to follow this patient by consultation request of  [name], NP to address advance care planning and complex medical decision making. This is a follow up visit. ? ?                                 ASSESSMENT, SYMPTOM MANAGEMENT AND PLAN / RECOMMENDATIONS:  ? Palliative Care Encounter- Has DNR and MOST on file. [name], son is [facility] POA ?      Remains dementia FAST 7 score 7c. ? ?2.    Sacral decubitus with fat layer exposed- ?Monthly wound care visit, using alginate dressing in wound bed. ?Continue optimal glucose management.   ?Continue to supplement protein for wound healing. ? ?3.   Severe protein calorie malnutrition-Last Albumin 09/10/20 was 2.9.   ?Continue Protein supplement with glucerna ? ?4.   Swallowing impairment ?Improved intake with less coughing on pureed food ?sitting up for meals and for 30 minutes beyond. ? ? ?5.   Diabetes without long term insulin use- ?Stable blood sugar in good range. ?Continue Metformin 250-500 mg BID based on fasting blood sugar. ? ?Advance Care Planning/Goals of Care: Goals include to maximize quality of life and symptom management.  ? ?CODE STATUS: ?  ?DNR ?  ?MOST as of 08/23/2019: ?DNR/DNI ?Limited Additional interventions ?Antibiotics on case by case basis ?IV fluids on time limited trial basis ?No feeding tube ? ? ? ?Follow up Palliative Care Visit: Palliative care will continue to follow for complex medical decision making, advance care planning, and clarification of goals. Return 4-6 weeks or prn. ? ? ? ?This visit was coded based on medical decision making (MDM). ? ?PPS: 30% ? ?HOSPICE ELIGIBILITY/DIAGNOSIS: TBD ? ?Chief Complaint:  ?Palliative Care is following up for chronic management of this bedbound patient with diabetes, hx of stroke, PE and dementia to help manage symptoms and address goals of care. ? ?HISTORY OF PRESENT ILLNESS:  [name] is a 86 y.o. year old female  with dementia, hx of CVA with residual left sided weakness, hx of DVT and PE, HTN, atrial septal aneurysm, sacral decubitus ulcer, Type 2 DM with polyneuropathy, severe protein calorie malnutrition and dysphagia.  Pt remains total care, has had no falls and no issues with constipation.  Fasting blood sugars are running mostly less than 150 with occasional 200, no lows below mid 90s.  VS stable.  Appetite is good.  Wound care nurse there doing dressing currently.  [name] denies fever, wound drainage, increase in undermining or dimensions of wound.  [name] was able to nod her head no in response to questions about pain. ? ?History obtained from review of EMR, discussion with daughter in law [name] who is primary caregiver and/or [name].  ?I reviewed available labs, medications, imaging, studies and related documents from the EMR.  Records reviewed and summarized above.  ? ?ROS ?Information obtained  from daughter in law who is primary caregiver as pt largely non-verbal ? ?Physical Exam: limited to observation only ?Current and past weights: no weights on file since 2020 ?Constitutional: NAD ?General: frail appearing, thin ?EYES: anicteric sclera, lids intact, no discharge  ?ENMT: intact hearing ?CV: No visible cyanosis, dyspnea or edema ?Pulmonary: No audible wheezing, resps appear even and unlabored, no cough, room air ?Abdomen: no visible distension ?GU: deferred ?Skin: sacral wound covered with dressing ?Neuro: largely non-verbal, awake and alert ?Psych: non-anxious affect ?Hem/lymph/immuno: no widespread bruising of visible areas of upper body ? ? ?Thank you for the opportunity to participate in the care of [name].  The palliative care team will continue to follow. Please call our office at [phone] if we can be of additional assistance.  ? ?Marijo Conception, FNP -C ? ?COVID-19 PATIENT SCREENING TOOL ?Asked and negative response unless otherwise noted:  ? ?Have you had symptoms of covid, tested positive or been in contact with someone with symptoms/positive test in the past 5-10 days?  unknown ? ?

## 2021-08-10 NOTE — Telephone Encounter (Signed)
FYI ? ?[name] with [facility] called and stated that she had a wound visit with patient today and she is just calling to make you aware that there was [facility] in the wound bed.  ? ? ?

## 2021-08-17 DIAGNOSIS — E1140 Type 2 diabetes mellitus with diabetic neuropathy, unspecified: Secondary | ICD-10-CM | POA: Diagnosis not present

## 2021-08-17 DIAGNOSIS — I890 Lymphedema, not elsewhere classified: Secondary | ICD-10-CM | POA: Diagnosis not present

## 2021-08-17 DIAGNOSIS — F0390 Unspecified dementia without behavioral disturbance: Secondary | ICD-10-CM | POA: Diagnosis not present

## 2021-08-17 DIAGNOSIS — L89892 Pressure ulcer of other site, stage 2: Secondary | ICD-10-CM | POA: Diagnosis not present

## 2021-08-17 DIAGNOSIS — L89320 Pressure ulcer of left buttock, unstageable: Secondary | ICD-10-CM | POA: Diagnosis not present

## 2021-08-17 DIAGNOSIS — E785 Hyperlipidemia, unspecified: Secondary | ICD-10-CM | POA: Diagnosis not present

## 2021-08-24 DIAGNOSIS — E785 Hyperlipidemia, unspecified: Secondary | ICD-10-CM | POA: Diagnosis not present

## 2021-08-24 DIAGNOSIS — F0390 Unspecified dementia without behavioral disturbance: Secondary | ICD-10-CM | POA: Diagnosis not present

## 2021-08-24 DIAGNOSIS — L89892 Pressure ulcer of other site, stage 2: Secondary | ICD-10-CM | POA: Diagnosis not present

## 2021-08-24 DIAGNOSIS — L89320 Pressure ulcer of left buttock, unstageable: Secondary | ICD-10-CM | POA: Diagnosis not present

## 2021-08-24 DIAGNOSIS — I890 Lymphedema, not elsewhere classified: Secondary | ICD-10-CM | POA: Diagnosis not present

## 2021-08-24 DIAGNOSIS — E1140 Type 2 diabetes mellitus with diabetic neuropathy, unspecified: Secondary | ICD-10-CM | POA: Diagnosis not present

## 2021-08-25 ENCOUNTER — Other Ambulatory Visit: Payer: Medicare Other | Admitting: Family Medicine

## 2021-08-25 ENCOUNTER — Other Ambulatory Visit: Payer: Medicare Other | Admitting: *Deleted

## 2021-08-25 ENCOUNTER — Encounter: Payer: Self-pay | Admitting: Family Medicine

## 2021-08-25 VITALS — BP 160/92 | HR 69 | Temp 97.6°F | Resp 16

## 2021-08-25 DIAGNOSIS — Z7984 Long term (current) use of oral hypoglycemic drugs: Secondary | ICD-10-CM | POA: Diagnosis not present

## 2021-08-25 DIAGNOSIS — Z8673 Personal history of transient ischemic attack (TIA), and cerebral infarction without residual deficits: Secondary | ICD-10-CM | POA: Diagnosis not present

## 2021-08-25 DIAGNOSIS — M6281 Muscle weakness (generalized): Secondary | ICD-10-CM | POA: Diagnosis not present

## 2021-08-25 DIAGNOSIS — I10 Essential (primary) hypertension: Secondary | ICD-10-CM | POA: Diagnosis not present

## 2021-08-25 DIAGNOSIS — Z515 Encounter for palliative care: Secondary | ICD-10-CM

## 2021-08-25 DIAGNOSIS — R1310 Dysphagia, unspecified: Secondary | ICD-10-CM

## 2021-08-25 DIAGNOSIS — L89892 Pressure ulcer of other site, stage 2: Secondary | ICD-10-CM | POA: Diagnosis not present

## 2021-08-25 DIAGNOSIS — F0390 Unspecified dementia without behavioral disturbance: Secondary | ICD-10-CM | POA: Diagnosis not present

## 2021-08-25 DIAGNOSIS — E785 Hyperlipidemia, unspecified: Secondary | ICD-10-CM | POA: Diagnosis not present

## 2021-08-25 DIAGNOSIS — K5900 Constipation, unspecified: Secondary | ICD-10-CM | POA: Diagnosis not present

## 2021-08-25 DIAGNOSIS — Z86718 Personal history of other venous thrombosis and embolism: Secondary | ICD-10-CM | POA: Diagnosis not present

## 2021-08-25 DIAGNOSIS — Z741 Need for assistance with personal care: Secondary | ICD-10-CM | POA: Diagnosis not present

## 2021-08-25 DIAGNOSIS — L89320 Pressure ulcer of left buttock, unstageable: Secondary | ICD-10-CM | POA: Diagnosis not present

## 2021-08-25 DIAGNOSIS — E1140 Type 2 diabetes mellitus with diabetic neuropathy, unspecified: Secondary | ICD-10-CM | POA: Diagnosis not present

## 2021-08-25 DIAGNOSIS — Z86711 Personal history of pulmonary embolism: Secondary | ICD-10-CM | POA: Diagnosis not present

## 2021-08-25 DIAGNOSIS — I890 Lymphedema, not elsewhere classified: Secondary | ICD-10-CM | POA: Diagnosis not present

## 2021-08-25 DIAGNOSIS — Z7901 Long term (current) use of anticoagulants: Secondary | ICD-10-CM | POA: Diagnosis not present

## 2021-08-25 NOTE — Progress Notes (Signed)
? ? ?Manufacturing engineer ?Community Palliative Care Consult Note ?Telephone: [phone]  ?Fax: [phone]  ? ? ?Date of encounter: 08/25/21 ?7:41 PM ?PATIENT NAME: [name] ?[place] [address]   ?[phone] (home)  ?DOB: 1935-07-21 ?MRN: [id number] ?PRIMARY CARE PROVIDER:    ?[name], NP,  ?[address]. ?[place] [address] ?[phone] ? ?REFERRING PROVIDER:   ?[name], NP ?[address]. ?[place],  [place] [address] ?[phone] ? ?RESPONSIBLE PARTY:    ?Contact Information   ? ? Name Relation Home Work Mobile  ? Contreras,Carolyn Relative [phone]  [phone]  ? Carolyn, Contreras [phone]    ? ?  ? ?I connected with  [name] 's primary caregiver, [name], on 08/25/21 by a video enabled telemedicine application.  ?  ?I discussed the limitations of evaluation and management by telemedicine. The patient expressed understanding and agreed to proceed.  Palliative Care was asked to follow this patient by consultation request of  [name], NP to address advance care planning and complex medical decision making. This is a follow up visit. ? ?                                 ASSESSMENT, SYMPTOM MANAGEMENT AND PLAN / RECOMMENDATIONS:  ?Hypertension ?BP elevated, if remains elevated may consider increasing dose of Losartan to 100 mg po daily. ? ?2   Swallowing impairment ?Stable on current diet with no observed coughing ? ? ?Advance Care Planning/Goals of Care: Goals include to maximize quality of life and symptom management.  ? ?CODE STATUS: ?  ?DNR ?  ?MOST as of 08/23/2019: ?DNR/DNI ?Limited Additional interventions ?Antibiotics on case by case basis ?IV fluids on time limited trial basis ?No feeding tube ? ? ? ?Follow up Palliative Care Visit: Palliative care will continue to follow for complex medical decision making, advance care planning, and clarification of goals. Return 2-4 weeks or prn. ? ? ? ?This visit was coded  based on medical decision making (MDM). ? ?PPS: 30% ? ?HOSPICE ELIGIBILITY/DIAGNOSIS: TBD ? ?Chief Complaint:  ?Palliative Care nurse [name] connected patient, paid caregiver, and daughter by telemedicine video to allow provider to assess patient for medical management of chronic disease including diabetes, dementia and hx of stroke. ? ?HISTORY OF PRESENT ILLNESS:  [name] is a 86 y.o. year old female  with dementia, hx of CVA with residual left sided weakness, hx of DVT and PE, HTN, atrial septal aneurysm, sacral decubitus ulcer, Type 2 DM with polyneuropathy, severe protein calorie malnutrition and dysphagia.  Pt remains total care. BP runs too low with use of Clonidine so family has been intermittently holding and giving only the Losartan which brings BP down from 180s/100 to 160s/90 per Carolyn.  Appetite is good, pt has a little more phlegm in early am which they manage by having her drink before starting to feed her. [name] states wound may be slightly smaller but has no drainage or odor. She has another area on her left hip and leg which is trying to break down. Patient is seen sitting up in bed being fed by paid caregiver. ? ?History obtained from review of EMR, discussion with daughter in law [name] who is primary caregiver, nurse [name] and/or [name].  ?I reviewed available labs, medications, imaging, studies and related documents from the EMR.  Records reviewed and summarized above.  ? ?  ROS ?Information obtained from daughter in law and nurse [name] ? ?Physical Exam: limited to observation only (see [name]'s note for PE) ?Constitutional: NAD, non-toxic ?General: frail appearing, thin ?EYES: anicteric sclera, lids intact, no discharge  ?CV: No visible cyanosis or dyspnea ?Pulmonary: No audible wheezing, resps appear even and unlabored, no cough, ?Neuro: non-verbal, awake and alert ?Psych: non-anxious affect ? ? ? ?Thank you for the opportunity  to participate in the care of [name].  The palliative care team will continue to follow. Please call our office at [phone] if we can be of additional assistance.  ? ?Carolyn Conception, FNP -C ? ?COVID-19 PATIENT SCREENING TOOL ?Asked and negative response unless otherwise noted:  ? ?Have you had symptoms of covid, tested positive or been in contact with someone with symptoms/positive test in the past 5-10 days?  no ? ?

## 2021-08-27 DIAGNOSIS — Z20822 Contact with and (suspected) exposure to covid-19: Secondary | ICD-10-CM | POA: Diagnosis not present

## 2021-08-31 DIAGNOSIS — I890 Lymphedema, not elsewhere classified: Secondary | ICD-10-CM | POA: Diagnosis not present

## 2021-08-31 DIAGNOSIS — F0390 Unspecified dementia without behavioral disturbance: Secondary | ICD-10-CM | POA: Diagnosis not present

## 2021-08-31 DIAGNOSIS — E1140 Type 2 diabetes mellitus with diabetic neuropathy, unspecified: Secondary | ICD-10-CM | POA: Diagnosis not present

## 2021-08-31 DIAGNOSIS — L89892 Pressure ulcer of other site, stage 2: Secondary | ICD-10-CM | POA: Diagnosis not present

## 2021-08-31 DIAGNOSIS — L89320 Pressure ulcer of left buttock, unstageable: Secondary | ICD-10-CM | POA: Diagnosis not present

## 2021-08-31 DIAGNOSIS — E785 Hyperlipidemia, unspecified: Secondary | ICD-10-CM | POA: Diagnosis not present

## 2021-09-03 ENCOUNTER — Other Ambulatory Visit: Payer: Self-pay | Admitting: Nurse Practitioner

## 2021-09-03 DIAGNOSIS — E1169 Type 2 diabetes mellitus with other specified complication: Secondary | ICD-10-CM

## 2021-09-07 ENCOUNTER — Other Ambulatory Visit: Payer: Medicare Other | Admitting: Family Medicine

## 2021-09-08 ENCOUNTER — Encounter (HOSPITAL_BASED_OUTPATIENT_CLINIC_OR_DEPARTMENT_OTHER): Payer: Medicare Other | Attending: General Surgery | Admitting: General Surgery

## 2021-09-08 DIAGNOSIS — I10 Essential (primary) hypertension: Secondary | ICD-10-CM | POA: Diagnosis not present

## 2021-09-08 DIAGNOSIS — L89324 Pressure ulcer of left buttock, stage 4: Secondary | ICD-10-CM | POA: Insufficient documentation

## 2021-09-08 DIAGNOSIS — L89323 Pressure ulcer of left buttock, stage 3: Secondary | ICD-10-CM | POA: Diagnosis not present

## 2021-09-08 DIAGNOSIS — L89226 Pressure-induced deep tissue damage of left hip: Secondary | ICD-10-CM | POA: Diagnosis not present

## 2021-09-08 DIAGNOSIS — I872 Venous insufficiency (chronic) (peripheral): Secondary | ICD-10-CM | POA: Diagnosis not present

## 2021-09-08 DIAGNOSIS — E11622 Type 2 diabetes mellitus with other skin ulcer: Secondary | ICD-10-CM | POA: Diagnosis not present

## 2021-09-08 DIAGNOSIS — I87311 Chronic venous hypertension (idiopathic) with ulcer of right lower extremity: Secondary | ICD-10-CM | POA: Diagnosis not present

## 2021-09-08 DIAGNOSIS — L89896 Pressure-induced deep tissue damage of other site: Secondary | ICD-10-CM | POA: Insufficient documentation

## 2021-09-08 DIAGNOSIS — L97812 Non-pressure chronic ulcer of other part of right lower leg with fat layer exposed: Secondary | ICD-10-CM | POA: Diagnosis not present

## 2021-09-08 DIAGNOSIS — F0390 Unspecified dementia without behavioral disturbance: Secondary | ICD-10-CM | POA: Diagnosis not present

## 2021-09-08 NOTE — Progress Notes (Signed)
[name] ([id number]) ?, ?Visit Report for 09/08/2021 ?Arrival Information Details ?Patient Name: Date of Service: ?[name], [name] 09/08/2021 9:30 A M ?Medical Record Number: [id number] ?Patient Account Number: [id number] ?Date of Birth/Sex: Treating RN: ?1936/04/29 (86 y.o. [name] ?Primary Care Hadasah Brugger: [name] Other Clinician: ?Referring Vania Rosero: ?Treating Arriah Wadle/Extender: Fredirick Maudlin ?[name] ?Weeks in Treatment: 48 ?Visit Information History Since Last Visit ?Added or deleted any medications: No ?Patient Arrived: Wheel Chair ?Any new allergies or adverse reactions: No ?Arrival Time: 09:32 ?Had a fall or experienced change in No ?Accompanied By: daughter in law ?activities of daily living that may affect ?Transfer Assistance: Manual ?risk of falls: ?Patient Identification Verified: Yes ?Signs or symptoms of abuse/neglect since last visito No ?Secondary Verification Process Completed: Yes ?Hospitalized since last visit: No ?Patient Requires Transmission-Based Precautions: No ?Implantable device outside of the clinic excluding No ?Patient Has Alerts: No ?cellular tissue based products placed in the center ?since last visit: ?Has Dressing in Place as Prescribed: Yes ?Pain Present Now: No ?Electronic Signature(s) ?Signed: 09/08/2021 5:20:53 PM By: Adline Peals ?Entered By: Adline Peals on 09/08/2021 09:40:12 ?-------------------------------------------------------------------------------- ?Encounter Discharge Information Details ?Patient Name: Date of Service: ?[name], [name] 09/08/2021 9:30 A M ?Medical Record Number: [id number] ?Patient Account Number: [id number] ?Date of Birth/Sex: Treating RN: ?1935/06/24 (86 y.o. [name] ?Primary Care Leonel Mccollum: [name] Other Clinician: ?Referring Jacub Waiters: ?Treating Tamila Gaulin/Extender: Fredirick Maudlin ?[name] ?Weeks in Treatment: 48 ?Encounter Discharge Information Items Post Procedure Vitals ?Discharge  Condition: Stable ?Temperature (F): 98.3 ?Ambulatory Status: Wheelchair ?Pulse (bpm): 72 ?Discharge Destination: Home ?Respiratory Rate (breaths/min): 16 ?Transportation: Private Auto ?Blood Pressure (mmHg): 212/90 ?Accompanied By: daughter in law ?Schedule Follow-up Appointment: Yes ?Clinical Summary of Care: Patient Declined ?Electronic Signature(s) ?Signed: 09/08/2021 5:20:53 PM By: Adline Peals ?Entered By: Adline Peals on 09/08/2021 10:53:54 ?-------------------------------------------------------------------------------- ?Lower Extremity Assessment Details ?Patient Name: ?Date of Service: ?[name], [name] 09/08/2021 9:30 A M ?Medical Record Number: [id number] ?Patient Account Number: [id number] ?Date of Birth/Sex: ?Treating RN: ?Jul 30, 1935 (86 y.o. [name] ?Primary Care Boen Sterbenz: [name] ?Other Clinician: ?Referring Sung Renton: ?Treating Athziry Millican/Extender: Fredirick Maudlin ?[name] ?Weeks in Treatment: 48 ?Electronic Signature(s) ?Signed: 09/08/2021 5:20:53 PM By: Adline Peals ?Entered By: Adline Peals on 09/08/2021 09:42:53 ?-------------------------------------------------------------------------------- ?Multi Wound Chart Details ?Patient Name: ?Date of Service: ?[name], [name] 09/08/2021 9:30 A M ?Medical Record Number: [id number] ?Patient Account Number: [id number] ?Date of Birth/Sex: ?Treating RN: ?06-21-1935 (86 y.o. [name] ?Primary Care Shakiah Wester: [name] ?Other Clinician: ?Referring Saphira Lahmann: ?Treating Sayeed Weatherall/Extender: Fredirick Maudlin ?[name] ?Weeks in Treatment: 48 ?Vital Signs ?Height(in): 68 ?Pulse(bpm): 72 ?Weight(lbs): 110 ?Blood Pressure(mmHg): 212/90 ?Body Mass Index(BMI): 16.7 ?Temperature(??F): 98.3 ?Respiratory Rate(breaths/min): 16 ?Photos: [N/A:N/A] ?Left Gluteus Right, Proximal, Lateral Lower Leg N/A ?Wound Location: ?Pressure Injury Trauma N/A ?Wounding Event: ?Pressure Ulcer Venous Leg Ulcer N/A ?Primary  Etiology: ?Anemia, Deep Vein Thrombosis, Anemia, Deep Vein Thrombosis, N/A ?Comorbid History: ?Hypertension, Type II Diabetes, Hypertension, Type II Diabetes, ?Dementia Dementia ?09/18/2020 09/08/2021 N/A ?Date Acquired: ?16 0 N/A ?Weeks of Treatment: ?Open Open N/A ?Wound Status: ?No No N/A ?Wound Recurrence: ?4.1x3.5x1.5 0.5x0.3x0.1 N/A ?Measurements L x W x D (cm) ?11.27 0.118 N/A ?A (cm?) : ?rea ?16.906 0.012 N/A ?Volume (cm?) : ?72.90% 0.00% N/A ?% Reduction in A rea: ?-305.80% 0.00% N/A ?% Reduction in Volume: ?12 ?Starting Position 1 (o'clock): ?12 ?Ending Position 1 (o'clock): ?2.3 ?Maximum Distance 1 (cm): ?Yes No N/A ?Undermining: ?Category/Stage III Full Thickness Without Exposed N/A ?Classification: ?Support Structures ?Medium Medium N/A ?Exudate Amount: ?Serosanguineous Serosanguineous  N/A ?Exudate Type: ?red, brown red, brown N/A ?Exudate Color: ?Epibole Distinct, outline attached N/A ?Wound Margin: ?Medium (34-66%) Medium (34-66%) N/A ?Granulation Amount: ?Red, Pink Red N/A ?Granulation Quality: ?Medium (34-66%) Medium (34-66%) N/A ?Necrotic Amount: ?Fat Layer (Subcutaneous Tissue): Yes Fat Layer (Subcutaneous Tissue): Yes N/A ?Exposed Structures: ?Fascia: No ?Fascia: No ?Tendon: No ?Tendon: No ?Muscle: No ?Muscle: No ?Joint: No ?Joint: No ?Bone: No ?Bone: No ?Small (1-33%) Small (1-33%) N/A ?Epithelialization: ?Debridement - Excisional Debridement - Excisional N/A ?Debridement: ?Pre-procedure Verification/Time Out 10:11 10:11 N/A ?Taken: ?Other Other N/A ?Pain Control: ?Subcutaneous, Slough Subcutaneous, Slough N/A ?Tissue Debrided: ?Skin/Subcutaneous Tissue Skin/Subcutaneous Tissue N/A ?Level: ?14.35 0.15 N/A ?Debridement A (sq cm): ?rea ?Curette Curette N/A ?Instrument: ?Minimum Minimum N/A ?Bleeding: ?Pressure Pressure N/A ?Hemostasis A chieved: ?0 0 N/A ?Procedural Pain: ?0 0 N/A ?Post Procedural Pain: ?Procedure was tolerated well Procedure was tolerated well N/A ?Debridement Treatment  Response: ?4.1x3.5x1.5 0.5x0.3x0.1 N/A ?Post Debridement Measurements L x ?W x D (cm) ?16.906 0.012 N/A ?Post Debridement Volume: (cm?) ?Category/Stage III N/A N/A ?Post Debridement Stage: ?Debridement Debridement N/A ?Procedures Performed: ?Treatment Notes ?Electronic Signature(s) ?Signed: 09/08/2021 10:27:34 AM By: Fredirick Maudlin MD FACS ?Signed: 09/08/2021 5:20:53 PM By: Adline Peals ?Entered By: Fredirick Maudlin on 09/08/2021 10:27:34 ?-------------------------------------------------------------------------------- ?Multi-Disciplinary Care Plan Details ?Patient Name: ?Date of Service: ?[name], [name] 09/08/2021 9:30 A M ?Medical Record Number: [id number] ?Patient Account Number: [id number] ?Date of Birth/Sex: ?Treating RN: ?1935-12-29 (86 y.o. [name] ?Primary Care Jakiya Bookbinder: [name] ?Other Clinician: ?Referring Pauleen Goleman: ?Treating Jenalee Trevizo/Extender: Fredirick Maudlin ?[name] ?Weeks in Treatment: 48 ?Active Inactive ?Wound/Skin Impairment ?Nursing Diagnoses: ?Impaired tissue integrity ?Goals: ?Patient/caregiver will verbalize understanding of skin care regimen ?Date Initiated: 10/02/2020 ?Target Resolution Date: 09/29/2021 ?Goal Status: Active ?Ulcer/skin breakdown will have a volume reduction of 30% by week 4 ?Date Initiated: 10/02/2020 ?Date Inactivated: 01/06/2021 ?Target Resolution Date: 01/09/2021 ?Goal Status: Met ?Interventions: ?Assess patient/caregiver ability to obtain necessary supplies ?Assess patient/caregiver ability to perform ulcer/skin care regimen upon admission and as needed ?Assess ulceration(s) every visit ?Provide education on ulcer and skin care ?Treatment Activities: ?Skin care regimen initiated : 10/02/2020 ?Topical wound management initiated : 10/02/2020 ?Notes: ?11/10/20 : Goal not yet met, eschar debrided off increasing wound size. ?Electronic Signature(s) ?Signed: 09/08/2021 5:20:53 PM By: Adline Peals ?Entered By: Adline Peals on 09/08/2021  09:56:33 ?-------------------------------------------------------------------------------- ?Pain Assessment Details ?Patient Name: ?Date of Service: ?[name], [name] 09/08/2021 9:30 A M ?Medical Record Number: [id number] ?Patient Accou

## 2021-09-08 NOTE — Progress Notes (Signed)
[name] ([id number]) ?, ?Visit Report for 09/08/2021 ?Chief Complaint Document Details ?Patient Name: Date of Service: ?[name] 09/08/2021 9:30 A M ?Medical Record Number: [id number] ?Patient Account Number: [id number] ?Date of Birth/Sex: Treating RN: ?1935/09/27 (86 y.o. [name] ?Primary Care Provider: Sherrie Contreras Other Clinician: ?Referring Provider: ?Treating Provider/Extender: [name] ?[name] ?Weeks in Treatment: 48 ?Information Obtained from: Patient ?Chief Complaint ?LEFT buttocks pressure ulcer ?Electronic Signature(s) ?Signed: 09/08/2021 10:27:53 AM By: Carolyn Maudlin MD FACS ?Entered By: [name] on 09/08/2021 10:27:52 ?-------------------------------------------------------------------------------- ?Debridement Details ?Patient Name: Date of Service: ?[name] 09/08/2021 9:30 A M ?Medical Record Number: [id number] ?Patient Account Number: [id number] ?Date of Birth/Sex: Treating RN: ?February 22, 1936 (86 y.o. [name] ?Primary Care Provider: Sherrie Contreras Other Clinician: ?Referring Provider: ?Treating Provider/Extender: [name] ?[name] ?Weeks in Treatment: 48 ?Debridement Performed for Assessment: Wound #2 Left Gluteus ?Performed By: Physician Carolyn Maudlin, MD ?Debridement Type: Debridement ?Level of Consciousness (Pre-procedure): Awake and Alert ?Pre-procedure Verification/Time Out Yes - 10:11 ?Taken: ?Start Time: 10:11 ?Pain Control: ?Other : benzocaine 20% ?T Area Debrided (L x W): ?otal 4.1 (cm) x 3.5 (cm) = 14.35 (cm?) ?Tissue and other material debrided: Viable, Non-Viable, Slough, Subcutaneous, Slough ?Level: Skin/Subcutaneous Tissue ?Debridement Description: Excisional ?Instrument: Curette ?Bleeding: Minimum ?Hemostasis Achieved: Pressure ?Procedural Pain: 0 ?Post Procedural Pain: 0 ?Response to Treatment: Procedure was tolerated well ?Level of Consciousness (Post- Awake and Alert ?procedure): ?Post Debridement  Measurements of Total Wound ?Length: (cm) 4.1 ?Stage: Category/Stage III ?Width: (cm) 3.5 ?Depth: (cm) 1.5 ?Volume: (cm?) 16.906 ?Character of Wound/Ulcer Post Debridement: Improved ?Post Procedure Diagnosis ?Same as Pre-procedure ?Electronic Signature(s) ?Signed: 09/08/2021 11:35:57 AM By: Carolyn Maudlin MD FACS ?Signed: 09/08/2021 5:20:53 PM By: [name] ?Entered By: [name] on 09/08/2021 10:12:51 ?-------------------------------------------------------------------------------- ?Debridement Details ?Patient Name: ?Date of Service: ?[name] 09/08/2021 9:30 A M ?Medical Record Number: [id number] ?Patient Account Number: [id number] ?Date of Birth/Sex: ?Treating RN: ?11/16/1935 (86 y.o. [name] ?Primary Care Provider: Sherrie Contreras ?Other Clinician: ?Referring Provider: ?Treating Provider/Extender: [name] ?[name] ?Weeks in Treatment: 48 ?Debridement Performed for Assessment: Wound #4 Right,Proximal,Lateral Lower Leg ?Performed By: Physician Carolyn Maudlin, MD ?Debridement Type: Debridement ?Severity of Tissue Pre Debridement: Fat layer exposed ?Level of Consciousness (Pre-procedure): Awake and Alert ?Pre-procedure Verification/Time Out Yes - 10:11 ?Taken: ?Start Time: 10:11 ?Pain Control: ?Other : benzocaine 20% ?T Area Debrided (L x W): ?otal 0.5 (cm) x 0.3 (cm) = 0.15 (cm?) ?Tissue and other material debrided: Viable, Non-Viable, Slough, Subcutaneous, Slough ?Level: Skin/Subcutaneous Tissue ?Debridement Description: Excisional ?Instrument: Curette ?Bleeding: Minimum ?Hemostasis Achieved: Pressure ?Procedural Pain: 0 ?Post Procedural Pain: 0 ?Response to Treatment: Procedure was tolerated well ?Level of Consciousness (Post- Awake and Alert ?procedure): ?Post Debridement Measurements of Total Wound ?Length: (cm) 0.5 ?Width: (cm) 0.3 ?Depth: (cm) 0.1 ?Volume: (cm?) 0.012 ?Character of Wound/Ulcer Post Debridement: Improved ?Severity of Tissue Post Debridement:  Fat layer exposed ?Post Procedure Diagnosis ?Same as Pre-procedure ?Electronic Signature(s) ?Signed: 09/08/2021 11:35:57 AM By: Carolyn Maudlin MD FACS ?Signed: 09/08/2021 5:20:53 PM By: [name] ?Entered By: [name] on 09/08/2021 10:17:29 ?-------------------------------------------------------------------------------- ?HPI Details ?Patient Name: ?Date of Service: ?[name] 09/08/2021 9:30 A M ?Medical Record Number: [id number] ?Patient Account Number: [id number] ?Date of Birth/Sex: ?Treating RN: ?[birth year] (86 y.o. [name] ?Primary Care Provider: Sherrie Contreras Other Clinician: ?Referring Provider: ?Treating Provider/Extender: [name] ?[name] ?Weeks in Treatment: 48 ?History of Present Illness ?HPI Description: Admission 5/27 ?[name] is an 86 year old female with a past medical  history of type 2 diabetes on oral agents, hypertension and dementia that presents today to the ?clinic for a wound to her right buttocks. Her daughter-in-law is present today and helps provide the history. This issue has been going on for 2 to 3 weeks now. ?She states she developed an eschar about 1 week ago. She has been taking doxycycline for possible soft tissue infection to the area prescribed by her ?primary care provider. Patient currently denies any signs of infection. ?6/3; patient admitted to the clinic last week. She has a large necrotic area on her left buttock. They use Santyl last week. On arrival this visit she had ?completely necrotic surface with open to subcutaneous tissue. The surface was completely nonviable. They have been using Santyl. She apparently had been ?on doxycycline which she is just finishing prescribed by her primary doctor for possible soft tissue infection ?6/7; patient with a large necrotic wound over her left buttock. Aggressive debridement last week. Culture of this grew Proteus unfortunately would not of been ?covered well or at least  predictably by the doxycycline that her primary doctor put her on. I have therefore put her on Augmentin suspension 3 times a day. ?We are using silver alginate as the primary dressing while we deal with the infection. ?Her daughter has a list of concerns including swallowing difficulties, concerns about aspiration. The patient has advanced dementia. If this is Alzheimer's ?disease it is at its preterminal stages. I went over that swallowing difficulties are part of what happens in the preterminal stages of Alzheimer's disease. ?Nevertheless we will family members seems to want to pursue a very aggressive course ?6/21 large wound over her left buttock. She has completed the antibiotics I gave her [Augmentin]. We are using silver alginate while we are dealing with the ?infection and also to help with the drainage ?In general the wound looks better she has healthy granulation over perhaps 70% of it. Superiorly there is still necrotic debris I did not attempt to debride this ?today ?The patient has advanced dementia. I do not know that she could tolerate a wound VAC. She also has double incontinence which would make that difficult. ?Nevertheless she has been cared for diligently by her family. She is apparently eating well and may be 2 to 3 hours on this area all day ?7/5; 2-week follow-up. She continues with a nice improvement in overall condition of the wound bed. The undermining area has still some adherent surface ?slough. We have been using silver alginate because of the drainage and possibility of at least superficial wound infection/colonization. ?Although I said in previous notes I wondered whether she could tolerate the wound VAC they have done so well with this wound at home I do not want to ?necessarily rule her out for a wound VAC and I am going to direct the start of a trial of wound VAC therapy if this can be covered by insurance, if we have ?home health support to change it at all ?7/19; we did not get  very far with the wound VAC because my original ICD 10 said this was unstageable. As usual this represents a considerable frustration ?because we would not of ordered a wound VAC if the wound was still uns

## 2021-09-11 DIAGNOSIS — Z20822 Contact with and (suspected) exposure to covid-19: Secondary | ICD-10-CM | POA: Diagnosis not present

## 2021-09-14 DIAGNOSIS — E785 Hyperlipidemia, unspecified: Secondary | ICD-10-CM | POA: Diagnosis not present

## 2021-09-14 DIAGNOSIS — E1140 Type 2 diabetes mellitus with diabetic neuropathy, unspecified: Secondary | ICD-10-CM | POA: Diagnosis not present

## 2021-09-14 DIAGNOSIS — L89892 Pressure ulcer of other site, stage 2: Secondary | ICD-10-CM | POA: Diagnosis not present

## 2021-09-14 DIAGNOSIS — F0390 Unspecified dementia without behavioral disturbance: Secondary | ICD-10-CM | POA: Diagnosis not present

## 2021-09-14 DIAGNOSIS — I890 Lymphedema, not elsewhere classified: Secondary | ICD-10-CM | POA: Diagnosis not present

## 2021-09-14 DIAGNOSIS — L89320 Pressure ulcer of left buttock, unstageable: Secondary | ICD-10-CM | POA: Diagnosis not present

## 2021-09-23 DIAGNOSIS — F0390 Unspecified dementia without behavioral disturbance: Secondary | ICD-10-CM | POA: Diagnosis not present

## 2021-09-23 DIAGNOSIS — I890 Lymphedema, not elsewhere classified: Secondary | ICD-10-CM | POA: Diagnosis not present

## 2021-09-23 DIAGNOSIS — L89892 Pressure ulcer of other site, stage 2: Secondary | ICD-10-CM | POA: Diagnosis not present

## 2021-09-23 DIAGNOSIS — E1140 Type 2 diabetes mellitus with diabetic neuropathy, unspecified: Secondary | ICD-10-CM | POA: Diagnosis not present

## 2021-09-23 DIAGNOSIS — E785 Hyperlipidemia, unspecified: Secondary | ICD-10-CM | POA: Diagnosis not present

## 2021-09-23 DIAGNOSIS — L89320 Pressure ulcer of left buttock, unstageable: Secondary | ICD-10-CM | POA: Diagnosis not present

## 2021-09-24 DIAGNOSIS — E785 Hyperlipidemia, unspecified: Secondary | ICD-10-CM | POA: Diagnosis not present

## 2021-09-24 DIAGNOSIS — L89222 Pressure ulcer of left hip, stage 2: Secondary | ICD-10-CM | POA: Diagnosis not present

## 2021-09-24 DIAGNOSIS — I10 Essential (primary) hypertension: Secondary | ICD-10-CM | POA: Diagnosis not present

## 2021-09-24 DIAGNOSIS — L89320 Pressure ulcer of left buttock, unstageable: Secondary | ICD-10-CM | POA: Diagnosis not present

## 2021-09-24 DIAGNOSIS — K5900 Constipation, unspecified: Secondary | ICD-10-CM | POA: Diagnosis not present

## 2021-09-24 DIAGNOSIS — M6281 Muscle weakness (generalized): Secondary | ICD-10-CM | POA: Diagnosis not present

## 2021-09-24 DIAGNOSIS — Z7984 Long term (current) use of oral hypoglycemic drugs: Secondary | ICD-10-CM | POA: Diagnosis not present

## 2021-09-24 DIAGNOSIS — Z86711 Personal history of pulmonary embolism: Secondary | ICD-10-CM | POA: Diagnosis not present

## 2021-09-24 DIAGNOSIS — Z7901 Long term (current) use of anticoagulants: Secondary | ICD-10-CM | POA: Diagnosis not present

## 2021-09-24 DIAGNOSIS — F0390 Unspecified dementia without behavioral disturbance: Secondary | ICD-10-CM | POA: Diagnosis not present

## 2021-09-24 DIAGNOSIS — Z8673 Personal history of transient ischemic attack (TIA), and cerebral infarction without residual deficits: Secondary | ICD-10-CM | POA: Diagnosis not present

## 2021-09-24 DIAGNOSIS — I890 Lymphedema, not elsewhere classified: Secondary | ICD-10-CM | POA: Diagnosis not present

## 2021-09-24 DIAGNOSIS — Z741 Need for assistance with personal care: Secondary | ICD-10-CM | POA: Diagnosis not present

## 2021-09-24 DIAGNOSIS — E1140 Type 2 diabetes mellitus with diabetic neuropathy, unspecified: Secondary | ICD-10-CM | POA: Diagnosis not present

## 2021-09-24 DIAGNOSIS — Z86718 Personal history of other venous thrombosis and embolism: Secondary | ICD-10-CM | POA: Diagnosis not present

## 2021-09-28 DIAGNOSIS — I890 Lymphedema, not elsewhere classified: Secondary | ICD-10-CM | POA: Diagnosis not present

## 2021-09-28 DIAGNOSIS — L89320 Pressure ulcer of left buttock, unstageable: Secondary | ICD-10-CM | POA: Diagnosis not present

## 2021-09-28 DIAGNOSIS — F0390 Unspecified dementia without behavioral disturbance: Secondary | ICD-10-CM | POA: Diagnosis not present

## 2021-09-28 DIAGNOSIS — L89222 Pressure ulcer of left hip, stage 2: Secondary | ICD-10-CM | POA: Diagnosis not present

## 2021-09-28 DIAGNOSIS — Z7984 Long term (current) use of oral hypoglycemic drugs: Secondary | ICD-10-CM | POA: Diagnosis not present

## 2021-09-28 DIAGNOSIS — E1140 Type 2 diabetes mellitus with diabetic neuropathy, unspecified: Secondary | ICD-10-CM | POA: Diagnosis not present

## 2021-09-30 ENCOUNTER — Other Ambulatory Visit: Payer: Self-pay | Admitting: Nurse Practitioner

## 2021-09-30 ENCOUNTER — Other Ambulatory Visit: Payer: Self-pay

## 2021-09-30 DIAGNOSIS — Z86718 Personal history of other venous thrombosis and embolism: Secondary | ICD-10-CM

## 2021-09-30 DIAGNOSIS — E1169 Type 2 diabetes mellitus with other specified complication: Secondary | ICD-10-CM

## 2021-09-30 DIAGNOSIS — I10 Essential (primary) hypertension: Secondary | ICD-10-CM

## 2021-09-30 DIAGNOSIS — E1142 Type 2 diabetes mellitus with diabetic polyneuropathy: Secondary | ICD-10-CM

## 2021-09-30 MED ORDER — GABAPENTIN 300 MG PO CAPS: 300.0000 mg | ORAL_CAPSULE | Freq: Two times a day (BID) | ORAL | 0 refills | Status: DC

## 2021-09-30 MED ORDER — LOSARTAN POTASSIUM 25 MG PO TABS: ORAL_TABLET | ORAL | 0 refills | Status: DC

## 2021-09-30 MED ORDER — LOSARTAN POTASSIUM 50 MG PO TABS: ORAL_TABLET | ORAL | 0 refills | Status: DC

## 2021-09-30 MED ORDER — METFORMIN HCL 500 MG PO TABS: 500.0000 mg | ORAL_TABLET | Freq: Two times a day (BID) | ORAL | 0 refills | Status: DC

## 2021-09-30 MED ORDER — CLONIDINE HCL 0.1 MG PO TABS: 0.1000 mg | ORAL_TABLET | Freq: Two times a day (BID) | ORAL | 0 refills | Status: DC

## 2021-09-30 MED ORDER — RIVAROXABAN 15 MG PO TABS: ORAL_TABLET | ORAL | 0 refills | Status: DC

## 2021-10-06 DIAGNOSIS — L89320 Pressure ulcer of left buttock, unstageable: Secondary | ICD-10-CM | POA: Diagnosis not present

## 2021-10-06 DIAGNOSIS — E1140 Type 2 diabetes mellitus with diabetic neuropathy, unspecified: Secondary | ICD-10-CM | POA: Diagnosis not present

## 2021-10-06 DIAGNOSIS — F0390 Unspecified dementia without behavioral disturbance: Secondary | ICD-10-CM | POA: Diagnosis not present

## 2021-10-06 DIAGNOSIS — Z7984 Long term (current) use of oral hypoglycemic drugs: Secondary | ICD-10-CM | POA: Diagnosis not present

## 2021-10-06 DIAGNOSIS — L89222 Pressure ulcer of left hip, stage 2: Secondary | ICD-10-CM | POA: Diagnosis not present

## 2021-10-06 DIAGNOSIS — I890 Lymphedema, not elsewhere classified: Secondary | ICD-10-CM | POA: Diagnosis not present

## 2021-10-06 NOTE — Progress Notes (Signed)
[facility] RN NOTE  PATIENT NAME: [name] DOB: 1936-03-12 MRN: [id number]  PRIMARY CARE PROVIDER: [name], NP  RESPONSIBLE PARTY: Sofie Rower (daughter in law) Acct ID - Guarantor Home Phone Work Phone Relationship Acct Type  [id number] [name], [name]  Self P/F     [address], [place], [place] [address]    RN follow up palliative care visit made. Damaris Hippo FNP conferenced in via video. Please see NP documentation for complete details of visit.   Daryl Eastern, RN BSN

## 2021-10-07 ENCOUNTER — Other Ambulatory Visit: Payer: Medicare Other | Admitting: Family Medicine

## 2021-10-13 DIAGNOSIS — L89222 Pressure ulcer of left hip, stage 2: Secondary | ICD-10-CM | POA: Diagnosis not present

## 2021-10-13 DIAGNOSIS — I890 Lymphedema, not elsewhere classified: Secondary | ICD-10-CM | POA: Diagnosis not present

## 2021-10-13 DIAGNOSIS — E1140 Type 2 diabetes mellitus with diabetic neuropathy, unspecified: Secondary | ICD-10-CM | POA: Diagnosis not present

## 2021-10-13 DIAGNOSIS — L89320 Pressure ulcer of left buttock, unstageable: Secondary | ICD-10-CM | POA: Diagnosis not present

## 2021-10-13 DIAGNOSIS — F0390 Unspecified dementia without behavioral disturbance: Secondary | ICD-10-CM | POA: Diagnosis not present

## 2021-10-13 DIAGNOSIS — Z7984 Long term (current) use of oral hypoglycemic drugs: Secondary | ICD-10-CM | POA: Diagnosis not present

## 2021-10-14 ENCOUNTER — Other Ambulatory Visit: Payer: Medicare Other | Admitting: Family Medicine

## 2021-10-14 DIAGNOSIS — L89153 Pressure ulcer of sacral region, stage 3: Secondary | ICD-10-CM

## 2021-10-14 DIAGNOSIS — I10 Essential (primary) hypertension: Secondary | ICD-10-CM

## 2021-10-14 NOTE — Progress Notes (Unsigned)
[facility] Consult Note Telephone: [phone]  Fax: [phone]    Date of encounter: 10/14/21 10:32 AM PATIENT NAME: [name] [address] [place] [address]   [phone] (home)  DOB: 04-12-1936 MRN: [id number] PRIMARY CARE PROVIDER:    [name], NP,  [place] [address] [phone]  REFERRING PROVIDER:   [name], NP [place],  [place] [address] [phone]  RESPONSIBLE PARTY:    Contact Information     Name Relation Home Work Mobile   [name] Relative [phone]  [phone]   [name], [name] [phone]        I connected with  [name] 's primary caregiver, [name], on 10/14/21 by a video enabled telemedicine application.    I discussed the limitations of evaluation and management by telemedicine. The patient expressed understanding and agreed to proceed.  Palliative Care was asked to follow this patient by consultation request of  [name], NP to address advance care planning and complex medical decision making. This is a follow up visit.                                   ASSESSMENT, SYMPTOM MANAGEMENT AND PLAN / RECOMMENDATIONS:  Hypertension BP elevated, if remains elevated may consider increasing dose of Losartan to 100 mg po daily.  2   Swallowing impairment Stable on current diet with no observed coughing   Advance Care Planning/Goals of Care: Goals include to maximize quality of life and symptom management.   CODE STATUS:   DNR   MOST as of 08/23/2019: DNR/DNI Limited Additional interventions Antibiotics on case by case basis IV fluids on time limited trial basis No feeding tube    Follow up Palliative Care Visit: Palliative care will continue to follow for complex medical decision making, advance care planning, and clarification of goals. Return 4 weeks or prn.    This visit was coded based  on medical decision making (MDM).  PPS: 30%  HOSPICE ELIGIBILITY/DIAGNOSIS: TBD  Chief Complaint:  Palliative Care following with daughter by telephone as we were unable to maintain a good zoom connection to allow provider to assess patient for medical management of chronic disease including diabetes, dementia and hx of stroke.  HISTORY OF PRESENT ILLNESS:  [name] is a 86 y.o. year old female  with dementia, hx of CVA with residual left sided weakness, hx of DVT and PE, HTN, atrial septal aneurysm, sacral decubitus ulcer, Type 2 DM with polyneuropathy, severe protein calorie malnutrition and dysphagia.  Pt remains total care. Blood sugars are running a little higher.  Pt is being turned frequently and has some skin breakdown on outside of her knee.  They are turning her some on her back at night with a donut pillow to alleviate pressure on her buttocks.  She states she has been using xeroform on her hip where the skin is just barely open.  She goes to the wound care clinic about once every 6 weeks. Appetite is good. [name] states wound may be slightly smaller but has no drainage or odor. No issues with constipation and was given prune juice. BP 167/88 this am.   History obtained from review of EMR, discussion with daughter in law [name] who is primary caregiver and/or [name].  I reviewed available labs, medications, imaging, studies and related documents from  the EMR.  Records reviewed and summarized above.   ROS Information obtained from daughter in law  Physical Exam:  Telephonic, unable to see pt    Thank you for the opportunity to participate in the care of [name].  The palliative care team will continue to follow. Please call our office at [phone] if we can be of additional assistance.   Marijo Conception, FNP -C  COVID-19 PATIENT SCREENING TOOL Asked and negative response unless otherwise noted:   Have you had symptoms of covid, tested positive  or been in contact with someone with symptoms/positive test in the past 5-10 days?  no

## 2021-10-16 ENCOUNTER — Encounter: Payer: Self-pay | Admitting: Family Medicine

## 2021-10-20 ENCOUNTER — Encounter (HOSPITAL_BASED_OUTPATIENT_CLINIC_OR_DEPARTMENT_OTHER): Payer: Medicare Other | Attending: General Surgery | Admitting: General Surgery

## 2021-10-20 DIAGNOSIS — E119 Type 2 diabetes mellitus without complications: Secondary | ICD-10-CM | POA: Diagnosis not present

## 2021-10-20 DIAGNOSIS — B964 Proteus (mirabilis) (morganii) as the cause of diseases classified elsewhere: Secondary | ICD-10-CM | POA: Insufficient documentation

## 2021-10-20 DIAGNOSIS — R159 Full incontinence of feces: Secondary | ICD-10-CM | POA: Insufficient documentation

## 2021-10-20 DIAGNOSIS — R1310 Dysphagia, unspecified: Secondary | ICD-10-CM | POA: Insufficient documentation

## 2021-10-20 DIAGNOSIS — F02C0 Dementia in other diseases classified elsewhere, severe, without behavioral disturbance, psychotic disturbance, mood disturbance, and anxiety: Secondary | ICD-10-CM | POA: Diagnosis not present

## 2021-10-20 DIAGNOSIS — Z7984 Long term (current) use of oral hypoglycemic drugs: Secondary | ICD-10-CM | POA: Diagnosis not present

## 2021-10-20 DIAGNOSIS — L89324 Pressure ulcer of left buttock, stage 4: Secondary | ICD-10-CM | POA: Diagnosis not present

## 2021-10-20 DIAGNOSIS — L89226 Pressure-induced deep tissue damage of left hip: Secondary | ICD-10-CM | POA: Diagnosis not present

## 2021-10-20 DIAGNOSIS — L89892 Pressure ulcer of other site, stage 2: Secondary | ICD-10-CM | POA: Diagnosis not present

## 2021-10-20 DIAGNOSIS — R32 Unspecified urinary incontinence: Secondary | ICD-10-CM | POA: Insufficient documentation

## 2021-10-20 DIAGNOSIS — L98412 Non-pressure chronic ulcer of buttock with fat layer exposed: Secondary | ICD-10-CM | POA: Diagnosis not present

## 2021-10-20 DIAGNOSIS — L98492 Non-pressure chronic ulcer of skin of other sites with fat layer exposed: Secondary | ICD-10-CM | POA: Diagnosis not present

## 2021-10-20 DIAGNOSIS — L89896 Pressure-induced deep tissue damage of other site: Secondary | ICD-10-CM | POA: Diagnosis not present

## 2021-10-20 DIAGNOSIS — L89323 Pressure ulcer of left buttock, stage 3: Secondary | ICD-10-CM | POA: Diagnosis not present

## 2021-10-20 NOTE — Progress Notes (Signed)
[name] ([id number]) , Visit Report for 10/20/2021 Arrival Information Details Patient Name: Date of Service: [name], [name] 10/20/2021 9:30 A M Medical Record Number: [id number] Patient Account Number: [id number] Date of Birth/Sex: Treating RN: 10-Jul-1935 (86 y.o. Valarie Cones, Mechele Claude Primary Care Karrisa Didio: [name] Other Clinician: Referring Merrily Tegeler: Treating Kensley Valladares/Extender: Carmie End in Treatment: 37 Visit Information History Since Last Visit Added or deleted any medications: No Patient Arrived: Wheel Chair Any new allergies or adverse reactions: No Arrival Time: 09:42 Had a fall or experienced change in No Accompanied By: daughter activities of daily living that may affect Transfer Assistance: Manual risk of falls: Patient Identification Verified: Yes Signs or symptoms of abuse/neglect since last visito No Patient Requires Transmission-Based Precautions: No Hospitalized since last visit: No Patient Has Alerts: No Implantable device outside of the clinic excluding No cellular tissue based products placed in the center since last visit: Has Dressing in Place as Prescribed: Yes Pain Present Now: No Electronic Signature(s) Signed: 10/20/2021 12:40:03 PM By: Dellie Catholic RN Entered By: Dellie Catholic on 10/20/2021 09:47:28 -------------------------------------------------------------------------------- Encounter Discharge Information Details Patient Name: Date of Service: [name] 10/20/2021 9:30 A M Medical Record Number: [id number] Patient Account Number: [id number] Date of Birth/Sex: Treating RN: 1935-09-10 (86 y.o. [name] Primary Care Avalynne Diver: [name] Other Clinician: Referring Jeremias Broyhill: Treating Yumalay Circle/Extender: Carmie End in Treatment: 102 Encounter Discharge Information Items Post Procedure Vitals Discharge Condition: Stable Temperature (F): 98.1 Ambulatory  Status: Wheelchair Pulse (bpm): 94 Discharge Destination: Home Respiratory Rate (breaths/min): 16 Transportation: Private Auto Blood Pressure (mmHg): 188/76 Accompanied By: daughter Schedule Follow-up Appointment: Yes Clinical Summary of Care: Patient Declined Electronic Signature(s) Signed: 10/20/2021 12:43:23 PM By: Dellie Catholic RN Entered By: Dellie Catholic on 10/20/2021 12:43:03 -------------------------------------------------------------------------------- Lower Extremity Assessment Details Patient Name: Date of Service: [name], [name] 10/20/2021 9:30 A M Medical Record Number: [id number] Patient Account Number: [id number] Date of Birth/Sex: Treating RN: 1935/07/03 (86 y.o. [name] Primary Care Olena Willy: [name] Other Clinician: Referring Nakeitha Milligan: Treating Jeffrey Graefe/Extender: Carmie End in Treatment: 2 Electronic Signature(s) Signed: 10/20/2021 12:40:03 PM By: Dellie Catholic RN Entered By: Dellie Catholic on 10/20/2021 09:51:33 -------------------------------------------------------------------------------- Multi Wound Chart Details Patient Name: Date of Service: [name] 10/20/2021 9:30 A M Medical Record Number: [id number] Patient Account Number: [id number] Date of Birth/Sex: Treating RN: 1936/01/18 (86 y.o. [name] Primary Care Alannis Hsia: [name] Other Clinician: Referring Judea Riches: Treating Vitaly Wanat/Extender: Carmie End in Treatment: 33 Vital Signs Height(in): 44 Pulse(bpm): 42 Weight(lbs): 110 Blood Pressure(mmHg): 188/76 Body Mass Index(BMI): 16.7 Temperature(F): 98.1 Respiratory Rate(breaths/min): 16 Photos: Left Gluteus Right, Proximal, Lateral Lower Leg Right Gluteus Wound Location: Pressure Injury Trauma Shear/Friction Wounding Event: Pressure Ulcer Pressure Ulcer T be determined o Primary Etiology: Anemia, Deep Vein Thrombosis, Anemia,  Deep Vein Thrombosis, Anemia, Deep Vein Thrombosis, Comorbid History: Hypertension, Type II Diabetes, Hypertension, Type II Diabetes, Hypertension, Type II Diabetes, Dementia Dementia Dementia 09/18/2020 09/08/2021 09/17/2021 Date Acquired: 54 6 0 Weeks of Treatment: Open Open Open Wound Status: No No No Wound Recurrence: 4.1x3.3x1.5 0.3x0.3x0.1 0.6x1.5x0.1 Measurements L x W x D (cm) 10.626 0.071 0.707 A (cm) : rea 15.94 0.007 0.071 Volume (cm) : 74.50% 39.80% 0.00% % Reduction in A rea: -282.60% 41.70% 0.00% % Reduction in Volume: 12 Starting Position 1 (o'clock): 12 Ending Position 1 (o'clock): 2.3 Maximum Distance 1 (cm): Yes No No Undermining: Category/Stage III Category/Stage II Full Thickness Without Exposed Classification: Support Structures Medium Medium  Medium Exudate Amount: Serosanguineous Serosanguineous Serosanguineous Exudate Type: red, brown red, brown red, brown Exudate Color: Epibole Distinct, outline attached N/A Wound Margin: Large (67-100%) Small (1-33%) Large (67-100%) Granulation Amount: Red, Pink Pink Red, Pink Granulation Quality: Small (1-33%) Large (67-100%) Small (1-33%) Necrotic Amount: Adherent [place] Necrotic Tissue: Fat Layer (Subcutaneous Tissue): Yes Fat Layer (Subcutaneous Tissue): Yes Fat Layer (Subcutaneous Tissue): Yes Exposed Structures: Fascia: No Fascia: No Fascia: No Tendon: No Tendon: No Tendon: No Muscle: No Muscle: No Muscle: No Joint: No Joint: No Joint: No Bone: No Bone: No Bone: No Small (1-33%) Small (1-33%) Small (1-33%) Epithelialization: Debridement - Excisional Debridement - Selective/Open Wound Debridement - Excisional Debridement: Pre-procedure Verification/Time Out 10:30 10:30 10:30 Taken: Other Other Other Pain Control: Subcutaneous, Slough Necrotic/Eschar Subcutaneous, Slough Tissue Debrided: Skin/Subcutaneous Tissue Non-Viable Tissue  Skin/Subcutaneous Tissue Level: 13.53 0.09 0.9 Debridement A (sq cm): rea Curette Curette Curette Instrument: Minimum Minimum Minimum Bleeding: Pressure Pressure Pressure Hemostasis A chieved: 0 0 0 Procedural Pain: 0 0 0 Post Procedural Pain: Procedure was tolerated well Procedure was tolerated well Procedure was tolerated well Debridement Treatment Response: 4.1x3.1x1.5 0.3x0.3x0.1 0.6x1.5x0.1 Post Debridement Measurements L x W x D (cm) 14.974 0.007 0.071 Post Debridement Volume: (cm) Category/Stage III Category/Stage II N/A Post Debridement Stage: Debridement Debridement Debridement Procedures Performed: Wound Number: 6 N/A N/A Photos: N/A N/A Right Trochanter N/A N/A Wound Location: Pressure Injury N/A N/A Wounding Event: T be determined o N/A N/A Primary Etiology: Anemia, Deep Vein Thrombosis, N/A N/A Comorbid History: Hypertension, Type II Diabetes, Dementia 09/17/2021 N/A N/A Date Acquired: 0 N/A N/A Weeks of Treatment: Open N/A N/A Wound Status: No N/A N/A Wound Recurrence: 0.2x0.2x0.1 N/A N/A Measurements L x W x D (cm) 0.031 N/A N/A A (cm) : rea 0.003 N/A N/A Volume (cm) : 0.00% N/A N/A % Reduction in A rea: 0.00% N/A N/A % Reduction in Volume: No N/A N/A Undermining: Full Thickness Without Exposed N/A N/A Classification: Support Structures Medium N/A N/A Exudate A mount: Serosanguineous N/A N/A Exudate Type: red, brown N/A N/A Exudate Color: Flat and Intact N/A N/A Wound Margin: Small (1-33%) N/A N/A Granulation A mount: Pink N/A N/A Granulation Quality: Large (67-100%) N/A N/A Necrotic A mount: Eschar N/A N/A Necrotic Tissue: Fat Layer (Subcutaneous Tissue): Yes N/A N/A Exposed Structures: Fascia: No Tendon: No Muscle: No Joint: No Bone: No Medium (34-66%) N/A N/A Epithelialization: Debridement - Excisional N/A N/A Debridement: Pre-procedure Verification/Time Out 10:30 N/A N/A Taken: Other N/A N/A Pain  Control: Subcutaneous, Slough N/A N/A Tissue Debrided: Skin/Subcutaneous Tissue N/A N/A Level: 0.04 N/A N/A Debridement A (sq cm): rea Curette N/A N/A Instrument: Minimum N/A N/A Bleeding: Pressure N/A N/A Hemostasis Achieved: 0 N/A N/A Procedural Pain: 0 N/A N/A Post Procedural Pain: Procedure was tolerated well N/A N/A Debridement Treatment Response: 0.2x0.2x0.1 N/A N/A Post Debridement Measurements L x W x D (cm) 0.003 N/A N/A Post Debridement Volume: (cm) N/A N/A N/A Post Debridement Stage: Debridement N/A N/A Procedures Performed: Treatment Notes Electronic Signature(s) Signed: 10/20/2021 10:59:17 AM By: Fredirick Maudlin MD FACS Signed: 10/20/2021 12:40:03 PM By: Dellie Catholic RN Entered By: Fredirick Maudlin on 10/20/2021 10:59:17 -------------------------------------------------------------------------------- Multi-Disciplinary Care Plan Details Patient Name: Date of Service: [place] 10/20/2021 9:30 A M Medical Record Number: [id number] Patient Account Number: [id number] Date of Birth/Sex: Treating RN: 1935/07/18 (86 y.o. [name] Primary Care Darol Cush: [name] Other Clinician: Referring Cachet Mccutchen: Treating Dietra Stokely/Extender: Carmie End in Treatment: 16 Active Inactive Wound/Skin Impairment  Nursing Diagnoses: Impaired tissue integrity Goals: Patient/caregiver will verbalize understanding of skin care regimen Date Initiated: 10/02/2020 Target Resolution Date: 12/03/2021 Goal Status: Active Ulcer/skin breakdown will have a volume reduction of 30% by week 4 Date Initiated: 10/02/2020 Date Inactivated: 01/06/2021 Target Resolution Date: 01/09/2021 Goal Status: Met Interventions: Assess patient/caregiver ability to obtain necessary supplies Assess patient/caregiver ability to perform ulcer/skin care regimen upon admission and as needed Assess ulceration(s) every visit Provide education on ulcer and skin  care Treatment Activities: Skin care regimen initiated : 10/02/2020 Topical wound management initiated : 10/02/2020 Notes: 11/10/20 : Goal not yet met, eschar debrided off increasing wound size. Electronic Signature(s) Signed: 10/20/2021 12:43:23 PM By: Dellie Catholic RN Entered By: Dellie Catholic on 10/20/2021 12:41:03 -------------------------------------------------------------------------------- Pain Assessment Details Patient Name: Date of Service: [name], [name] 10/20/2021 9:30 A M Medical Record Number: [id number] Patient Account Number: [id number] Date of Birth/Sex: Treating RN: December 29, 1935 (86 y.o. [name] Primary Care Elese Rane: [name] Other Clinician: Referring Mcgregor Tinnon: Treating Sharah Finnell/Extender: Carmie End in Treatment: 28 Active Problems Location of Pain Severity and Description of Pain Patient Has Paino No Site Locations Pain Management and Medication Current Pain Management: Electronic Signature(s) Signed: 10/20/2021 12:40:03 PM By: Dellie Catholic RN Entered By: Dellie Catholic on 10/20/2021 09:51:27 -------------------------------------------------------------------------------- Patient/Caregiver Education Details Patient Name: Date of Service: [name] 6/14/2023andnbsp9:30 [place] Record Number: [id number] Patient Account Number: [id number] Date of Birth/Gender: Treating RN: 05/17/1935 (86 y.o. [name] Primary Care Physician: [name] Other Clinician: Referring Physician: Treating Physician/Extender: Carmie End in Treatment: 12 Education Assessment Education Provided To: Patient Education Topics Provided Wound/Skin Impairment: Methods: Explain/Verbal Responses: Return demonstration correctly Electronic Signature(s) Signed: 10/20/2021 12:43:23 PM By: Dellie Catholic RN Entered By: Dellie Catholic on 10/20/2021  12:41:19 -------------------------------------------------------------------------------- Wound Assessment Details Patient Name: Date of Service: [name], [name] 10/20/2021 9:30 A M Medical Record Number: [id number] Patient Account Number: [id number] Date of Birth/Sex: Treating RN: [birth year] (86 y.o. [name] Primary Care Jatziry Wechter: [name] Other Clinician: Referring Jazmina Muhlenkamp: Treating Dandra Velardi/Extender: Carmie End in Treatment: 25 Wound Status Wound Number: 2 Primary Pressure Ulcer Etiology: Wound Location: Left Gluteus Wound Status: Open Wounding Event: Pressure Injury Comorbid Anemia, Deep Vein Thrombosis, Hypertension, Type II Date Acquired: 09/18/2020 History: Diabetes, Dementia Weeks Of Treatment: 54 Clustered Wound: No Photos Wound Measurements Length: (cm) 4.1 Width: (cm) 3.3 Depth: (cm) 1.5 Area: (cm) 10.626 Volume: (cm) 15.94 % Reduction in Area: 74.5% % Reduction in Volume: -282.6% Epithelialization: Small (1-33%) Undermining: Yes Starting Position (o'clock): 12 Ending Position (o'clock): 12 Maximum Distance: (cm) 2.3 Wound Description Classification: Category/Stage III Wound Margin: Epibole Exudate Amount: Medium Exudate Type: Serosanguineous Exudate Color: red, brown Foul Odor After Cleansing: No Slough/Fibrino Yes Wound Bed Granulation Amount: Large (67-100%) Exposed Structure Granulation Quality: Red, Pink Fascia Exposed: No Necrotic Amount: Small (1-33%) Fat Layer (Subcutaneous Tissue) Exposed: Yes Necrotic Quality: Adherent Slough Tendon Exposed: No Muscle Exposed: No Joint Exposed: No Bone Exposed: No Treatment Notes Wound #2 (Gluteus) Wound Laterality: Left Cleanser Soap and Water Discharge Instruction: May shower and wash wound with dial antibacterial soap and water prior to dressing change. Wound Cleanser Discharge Instruction: Cleanse the wound with wound cleanser prior to applying a  clean dressing using gauze sponges, not tissue or cotton balls. Peri-Wound Care Topical Primary Dressing KerraCel Ag Gelling Fiber Dressing, 4x5 in (silver alginate) Discharge Instruction: Apply silver alginate to wound bed as instructed Secondary Dressing Bordered Gauze, 4x4 in Discharge Instruction:  Apply over primary dressing as directed. Secured With Compression Wrap Compression Stockings Environmental education officer) Signed: 10/20/2021 12:40:03 PM By: Dellie Catholic RN Entered By: Dellie Catholic on 10/20/2021 10:14:32 -------------------------------------------------------------------------------- Wound Assessment Details Patient Name: Date of Service: [name], [name] 10/20/2021 9:30 A M Medical Record Number: [id number] Patient Account Number: [id number] Date of Birth/Sex: Treating RN: 1935-07-24 (86 y.o. [name] Primary Care Jelina Paulsen: [name] Other Clinician: Referring Ashely Joshua: Treating Darin Arndt/Extender: Carmie End in Treatment: 18 Wound Status Wound Number: 4 Primary Pressure Ulcer Etiology: Wound Location: Right, Proximal, Lateral Lower Leg Wound Status: Open Wounding Event: Trauma Comorbid Anemia, Deep Vein Thrombosis, Hypertension, Type II Date Acquired: 09/08/2021 History: Diabetes, Dementia Weeks Of Treatment: 6 Clustered Wound: No Photos Wound Measurements Length: (cm) 0.3 Width: (cm) 0.3 Depth: (cm) 0.1 Area: (cm) 0.071 Volume: (cm) 0.007 Wound Description Classification: Category/Stage II Wound Margin: Distinct, outline attached Exudate Amount: Medium Exudate Type: Serosanguineous Exudate Color: red, brown Foul Odor After Cleansing: Slough/Fibrino % Reduction in Area: 39.8% % Reduction in Volume: 41.7% Epithelialization: Small (1-33%) Tunneling: No Undermining: No No No Wound Bed Granulation Amount: Small (1-33%) Exposed Structure Granulation Quality: Pink Fascia Exposed: No Necrotic  Amount: Large (67-100%) Fat Layer (Subcutaneous Tissue) Exposed: Yes Necrotic Quality: Adherent Slough Tendon Exposed: No Muscle Exposed: No Joint Exposed: No Bone Exposed: No Treatment Notes Wound #4 (Lower Leg) Wound Laterality: Right, Lateral, Proximal Cleanser Soap and Water Discharge Instruction: May shower and wash wound with dial antibacterial soap and water prior to dressing change. Wound Cleanser Discharge Instruction: Cleanse the wound with wound cleanser prior to applying a clean dressing using gauze sponges, not tissue or cotton balls. Peri-Wound Care Topical Primary Dressing KerraCel Ag Gelling Fiber Dressing, 4x5 in (silver alginate) Discharge Instruction: Apply silver alginate to wound bed as instructed Xeroform Gauze Dressing, 2x2 (in/in) HBD Secondary Dressing Bordered Gauze, 4x4 in Discharge Instruction: Apply over primary dressing as directed. Secured With Compression Wrap Compression Stockings Environmental education officer) Signed: 10/20/2021 12:40:03 PM By: Dellie Catholic RN Entered By: Dellie Catholic on 10/20/2021 10:17:07 -------------------------------------------------------------------------------- Wound Assessment Details Patient Name: Date of Service: [name], [name] 10/20/2021 9:30 A M Medical Record Number: [id number] Patient Account Number: [id number] Date of Birth/Sex: Treating RN: 1936/01/07 (86 y.o. [name] Primary Care Sagal Gayton: [name] Other Clinician: Referring Joshia Kitchings: Treating Rishita Petron/Extender: Carmie End in Treatment: 66 Wound Status Wound Number: 5 Primary T be determined o Etiology: Wound Location: Right Gluteus Wound Status: Open Wounding Event: Shear/Friction Comorbid Anemia, Deep Vein Thrombosis, Hypertension, Type II Date Acquired: 09/17/2021 History: Diabetes, Dementia Weeks Of Treatment: 0 Clustered Wound: No Photos Wound Measurements Length: (cm) 0.6 Width: (cm)  1.5 Depth: (cm) 0.1 Area: (cm) 0.707 Volume: (cm) 0.071 % Reduction in Area: 0% % Reduction in Volume: 0% Epithelialization: Small (1-33%) Tunneling: No Undermining: No Wound Description Classification: Full Thickness Without Exposed Support Structures Exudate Amount: Medium Exudate Type: Serosanguineous Exudate Color: red, brown Foul Odor After Cleansing: No Slough/Fibrino Yes Wound Bed Granulation Amount: Large (67-100%) Exposed Structure Granulation Quality: Red, Pink Fascia Exposed: No Necrotic Amount: Small (1-33%) Fat Layer (Subcutaneous Tissue) Exposed: Yes Necrotic Quality: Eschar, Adherent Slough Tendon Exposed: No Muscle Exposed: No Joint Exposed: No Bone Exposed: No Treatment Notes Wound #5 (Gluteus) Wound Laterality: Right Cleanser Soap and Water Discharge Instruction: May shower and wash wound with dial antibacterial soap and water prior to dressing change. Wound Cleanser Discharge Instruction: Cleanse the wound with wound cleanser prior to applying a clean dressing using  gauze sponges, not tissue or cotton balls. Peri-Wound Care Topical Primary Dressing KerraCel Ag Gelling Fiber Dressing, 4x5 in (silver alginate) Discharge Instruction: Apply silver alginate to wound bed as instructed Secondary Dressing Bordered Gauze, 4x4 in Discharge Instruction: Apply over primary dressing as directed. Secured With Compression Wrap Compression Stockings Environmental education officer) Signed: 10/20/2021 12:40:03 PM By: Dellie Catholic RN Entered By: Dellie Catholic on 10/20/2021 10:16:13 -------------------------------------------------------------------------------- Wound Assessment Details Patient Name: Date of Service: [name], [name] 10/20/2021 9:30 A M Medical Record Number: [id number] Patient Account Number: [id number] Date of Birth/Sex: Treating RN: 07/04/1935 (86 y.o. [name] Primary Care Denise Bramblett: [name] Other Clinician: Referring  Ambrosia Wisnewski: Treating Tamirra Sienkiewicz/Extender: Carmie End in Treatment: 30 Wound Status Wound Number: 6 Primary T be determined o Etiology: Wound Location: Right Trochanter Wound Status: Open Wounding Event: Pressure Injury Comorbid Anemia, Deep Vein Thrombosis, Hypertension, Type II Date Acquired: 09/17/2021 History: Diabetes, Dementia Weeks Of Treatment: 0 Clustered Wound: No Photos Wound Measurements Length: (cm) 0.2 Width: (cm) 0.2 Depth: (cm) 0.1 Area: (cm) 0.031 Volume: (cm) 0.003 % Reduction in Area: 0% % Reduction in Volume: 0% Epithelialization: Medium (34-66%) Tunneling: No Undermining: No Wound Description Classification: Full Thickness Without Exposed Support Structures Wound Margin: Flat and Intact Exudate Amount: Medium Exudate Type: Serosanguineous Exudate Color: red, brown Foul Odor After Cleansing: No Slough/Fibrino Yes Wound Bed Granulation Amount: Small (1-33%) Exposed Structure Granulation Quality: Pink Fascia Exposed: No Necrotic Amount: Large (67-100%) Fat Layer (Subcutaneous Tissue) Exposed: Yes Necrotic Quality: Eschar Tendon Exposed: No Muscle Exposed: No Joint Exposed: No Bone Exposed: No Treatment Notes Wound #6 (Trochanter) Wound Laterality: Right Cleanser Soap and Water Discharge Instruction: May shower and wash wound with dial antibacterial soap and water prior to dressing change. Wound Cleanser Discharge Instruction: Cleanse the wound with wound cleanser prior to applying a clean dressing using gauze sponges, not tissue or cotton balls. Peri-Wound Care Topical Primary Dressing KerraCel Ag Gelling Fiber Dressing, 4x5 in (silver alginate) Discharge Instruction: Apply silver alginate to wound bed as instructed Secondary Dressing Bordered Gauze, 4x4 in Discharge Instruction: Apply over primary dressing as directed. Secured With Compression Wrap Compression Stockings Sport and exercise psychologist) Signed: 10/20/2021 12:40:03 PM By: Dellie Catholic RN Entered By: Dellie Catholic on 10/20/2021 10:15:24 -------------------------------------------------------------------------------- Vitals Details Patient Name: Date of Service: [name] 10/20/2021 9:30 A M Medical Record Number: [id number] Patient Account Number: [id number] Date of Birth/Sex: Treating RN: 03/28/1936 (86 y.o. [name] Primary Care Nashia Remus: [name] Other Clinician: Referring Alphonso Gregson: Treating Yosselyn Tax/Extender: Carmie End in Treatment: 71 Vital Signs Time Taken: 09:47 Temperature (F): 98.1 Height (in): 68 Pulse (bpm): 94 Weight (lbs): 110 Respiratory Rate (breaths/min): 16 Body Mass Index (BMI): 16.7 Blood Pressure (mmHg): 188/76 Reference Range: 80 - 120 mg / dl Electronic Signature(s) Signed: 10/20/2021 12:40:03 PM By: Dellie Catholic RN Entered By: Dellie Catholic on 10/20/2021 09:47:51

## 2021-10-20 NOTE — Progress Notes (Signed)
[name] ([id number]) , Visit Report for 10/20/2021 Chief Complaint Document Details Patient Name: Date of Service: [name], [name] 10/20/2021 9:30 A M Medical Record Number: [id number] Patient Account Number: [id number] Date of Birth/Sex: Treating RN: [birth year] (86 y.o. [name] Primary Care Provider: [name] Other Clinician: Referring Provider: Treating Provider/Extender: Carmie End in Treatment: 69 Information Obtained from: Patient Chief Complaint LEFT buttocks pressure ulcer Electronic Signature(s) Signed: 10/20/2021 10:59:26 AM By: Fredirick Maudlin MD FACS Entered By: Fredirick Maudlin on 10/20/2021 10:59:26 -------------------------------------------------------------------------------- Debridement Details Patient Name: Date of Service: [name] 10/20/2021 9:30 A M Medical Record Number: [id number] Patient Account Number: [id number] Date of Birth/Sex: Treating RN: April 28, 1936 (86 y.o. [name] Primary Care Provider: [name] Other Clinician: Referring Provider: Treating Provider/Extender: Carmie End in Treatment: 54 Debridement Performed for Assessment: Wound #2 Left Gluteus Performed By: Physician Fredirick Maudlin, MD Debridement Type: Debridement Level of Consciousness (Pre-procedure): Awake and Alert Pre-procedure Verification/Time Out Yes - 10:30 Taken: Start Time: 10:30 Pain Control: Other : Benzocaine 20% T Area Debrided (L x W): otal 4.1 (cm) x 3.3 (cm) = 13.53 (cm) Tissue and other material debrided: Viable, Non-Viable, Slough, Subcutaneous, Slough Level: Skin/Subcutaneous Tissue Debridement Description: Excisional Instrument: Curette Bleeding: Minimum Hemostasis Achieved: Pressure End Time: 10:33 Procedural Pain: 0 Post Procedural Pain: 0 Response to Treatment: Procedure was tolerated well Level of Consciousness (Post- Awake and Alert procedure): Post  Debridement Measurements of Total Wound Length: (cm) 4.1 Stage: Category/Stage III Width: (cm) 3.1 Depth: (cm) 1.5 Volume: (cm) 14.974 Character of Wound/Ulcer Post Debridement: Improved Post Procedure Diagnosis Same as Pre-procedure Electronic Signature(s) Signed: 10/20/2021 11:38:41 AM By: Fredirick Maudlin MD FACS Signed: 10/20/2021 12:40:03 PM By: Dellie Catholic RN Entered By: Dellie Catholic on 10/20/2021 10:39:51 -------------------------------------------------------------------------------- Debridement Details Patient Name: Date of Service: [name] 10/20/2021 9:30 A M Medical Record Number: [id number] Patient Account Number: [id number] Date of Birth/Sex: Treating RN: August 06, 1935 (86 y.o. [name] Primary Care Provider: [name] Other Clinician: Referring Provider: Treating Provider/Extender: Carmie End in Treatment: 54 Debridement Performed for Assessment: Wound #5 Right Gluteus Performed By: Physician Fredirick Maudlin, MD Debridement Type: Debridement Level of Consciousness (Pre-procedure): Awake and Alert Pre-procedure Verification/Time Out Yes - 10:30 Taken: Start Time: 10:30 Pain Control: Other : Benzocaine 20% T Area Debrided (L x W): otal 0.6 (cm) x 1.5 (cm) = 0.9 (cm) Tissue and other material debrided: Viable, Non-Viable, Slough, Subcutaneous, Slough Level: Skin/Subcutaneous Tissue Debridement Description: Excisional Instrument: Curette Bleeding: Minimum Hemostasis Achieved: Pressure End Time: 10:33 Procedural Pain: 0 Post Procedural Pain: 0 Response to Treatment: Procedure was tolerated well Level of Consciousness (Post- Awake and Alert procedure): Post Debridement Measurements of Total Wound Length: (cm) 0.6 Width: (cm) 1.5 Depth: (cm) 0.1 Volume: (cm) 0.071 Character of Wound/Ulcer Post Debridement: Improved Post Procedure Diagnosis Same as Pre-procedure Electronic Signature(s) Signed:  10/20/2021 11:38:41 AM By: Fredirick Maudlin MD FACS Signed: 10/20/2021 12:40:03 PM By: Dellie Catholic RN Entered By: Dellie Catholic on 10/20/2021 10:40:40 -------------------------------------------------------------------------------- Debridement Details Patient Name: Date of Service: [name] 10/20/2021 9:30 A M Medical Record Number: [id number] Patient Account Number: [id number] Date of Birth/Sex: Treating RN: [birth year] (86 y.o. [name] Primary Care Provider: [name] Other Clinician: Referring Provider: Treating Provider/Extender: Carmie End in Treatment: 54 Debridement Performed for Assessment: Wound #6 Right Trochanter Performed By: Physician Fredirick Maudlin, MD Debridement Type: Debridement Level of Consciousness (Pre-procedure): Awake and Alert Pre-procedure Verification/Time Out Yes -  10:30 Taken: Start Time: 10:30 Pain Control: Other : Benzocaine 20% T Area Debrided (L x W): otal 0.2 (cm) x 0.2 (cm) = 0.04 (cm) Tissue and other material debrided: Viable, Non-Viable, Slough, Subcutaneous, Slough Level: Skin/Subcutaneous Tissue Debridement Description: Excisional Instrument: Curette Bleeding: Minimum Hemostasis Achieved: Pressure End Time: 10:33 Procedural Pain: 0 Post Procedural Pain: 0 Response to Treatment: Procedure was tolerated well Level of Consciousness (Post- Awake and Alert procedure): Post Debridement Measurements of Total Wound Length: (cm) 0.2 Width: (cm) 0.2 Depth: (cm) 0.1 Volume: (cm) 0.003 Character of Wound/Ulcer Post Debridement: Improved Post Procedure Diagnosis Same as Pre-procedure Electronic Signature(s) Signed: 10/20/2021 11:38:41 AM By: Fredirick Maudlin MD FACS Signed: 10/20/2021 12:40:03 PM By: Dellie Catholic RN Entered By: Dellie Catholic on 10/20/2021 10:41:32 -------------------------------------------------------------------------------- Debridement Details Patient  Name: Date of Service: [name] 10/20/2021 9:30 A M Medical Record Number: [id number] Patient Account Number: [id number] Date of Birth/Sex: Treating RN: 11/17/1935 (86 y.o. [name] Primary Care Provider: [name] Other Clinician: Referring Provider: Treating Provider/Extender: Carmie End in Treatment: 54 Debridement Performed for Assessment: Wound #4 Right,Proximal,Lateral Lower Leg Performed By: Physician Fredirick Maudlin, MD Debridement Type: Debridement Level of Consciousness (Pre-procedure): Awake and Alert Pre-procedure Verification/Time Out Yes - 10:30 Taken: Start Time: 10:30 Pain Control: Other : Benzocaine 20% T Area Debrided (L x W): otal 0.3 (cm) x 0.3 (cm) = 0.09 (cm) Tissue and other material debrided: Non-Viable, Eschar Level: Non-Viable Tissue Debridement Description: Selective/Open Wound Instrument: Curette Bleeding: Minimum Hemostasis Achieved: Pressure End Time: 10:33 Procedural Pain: 0 Post Procedural Pain: 0 Response to Treatment: Procedure was tolerated well Level of Consciousness (Post- Level of Consciousness (Post- Awake and Alert procedure): Post Debridement Measurements of Total Wound Length: (cm) 0.3 Stage: Category/Stage II Width: (cm) 0.3 Depth: (cm) 0.1 Volume: (cm) 0.007 Character of Wound/Ulcer Post Debridement: Improved Post Procedure Diagnosis Same as Pre-procedure Electronic Signature(s) Signed: 10/20/2021 11:38:41 AM By: Fredirick Maudlin MD FACS Signed: 10/20/2021 12:40:03 PM By: Dellie Catholic RN Entered By: Dellie Catholic on 10/20/2021 10:42:23 -------------------------------------------------------------------------------- HPI Details Patient Name: Date of Service: [name] 10/20/2021 9:30 A M Medical Record Number: [id number] Patient Account Number: [id number] Date of Birth/Sex: Treating RN: 18-Mar-1936 (86 y.o. [name] Primary Care Provider: [name] Other Clinician: Referring Provider: Treating Provider/Extender: Carmie End in Treatment: 69 History of Present Illness HPI Description: Admission 5/27 [name] is an 86 year old female with a past medical history of type 2 diabetes on oral agents, hypertension and dementia that presents today to the clinic for a wound to her right buttocks. Her daughter-in-law is present today and helps provide the history. This issue has been going on for 2 to 3 weeks now. She states she developed an eschar about 1 week ago. She has been taking doxycycline for possible soft tissue infection to the area prescribed by her primary care provider. Patient currently denies any signs of infection. 6/3; patient admitted to the clinic last week. She has a large necrotic area on her left buttock. They use Santyl last week. On arrival this visit she had completely necrotic surface with open to subcutaneous tissue. The surface was completely nonviable. They have been using Santyl. She apparently had been on doxycycline which she is just finishing prescribed by her primary doctor for possible soft tissue infection 6/7; patient with a large necrotic wound over her left buttock. Aggressive debridement last week. Culture of this grew Proteus unfortunately would not of been covered well or  at least predictably by the doxycycline that her primary doctor put her on. I have therefore put her on Augmentin suspension 3 times a day. We are using silver alginate as the primary dressing while we deal with the infection. Her daughter has a list of concerns including swallowing difficulties, concerns about aspiration. The patient has advanced dementia. If this is Alzheimer's disease it is at its preterminal stages. I went over that swallowing difficulties are part of what happens in the preterminal stages of Alzheimer's disease. Nevertheless we will family members seems to want to pursue a  very aggressive course 6/21 large wound over her left buttock. She has completed the antibiotics I gave her [Augmentin]. We are using silver alginate while we are dealing with the infection and also to help with the drainage In general the wound looks better she has healthy granulation over perhaps 70% of it. Superiorly there is still necrotic debris I did not attempt to debride this today The patient has advanced dementia. I do not know that she could tolerate a wound VAC. She also has double incontinence which would make that difficult. Nevertheless she has been cared for diligently by her family. She is apparently eating well and may be 2 to 3 hours on this area all day 7/5; 2-week follow-up. She continues with a nice improvement in overall condition of the wound bed. The undermining area has still some adherent surface slough. We have been using silver alginate because of the drainage and possibility of at least superficial wound infection/colonization. Although I said in previous notes I wondered whether she could tolerate the wound VAC they have done so well with this wound at home I do not want to necessarily rule her out for a wound VAC and I am going to direct the start of a trial of wound VAC therapy if this can be covered by insurance, if we have home health support to change it at all 7/19; we did not get very far with the wound VAC because my original ICD 10 said this was unstageable. As usual this represents a considerable frustration because we would not of ordered a wound VAC if the wound was still unstageable. This currently represents a stage IV staging. This is all the way down through muscle layer. There is a rim of tissue over the bone and no palpable exposed bone but this is a deep wound. Fortunately at present no evidence of infection. We have been using silver collagen with backing wet-to-dry. The wound is really no better today but no worse 8/2; patient presents for 2-week  follow-up. She has been using the wound VAC for the past week. There has been some issues with keeping the drape in place however home health comes out to reapply the drape when this happens. Overall there are no issues or complaints today. No signs of infection. 8/17; patient has been using the wound VAC on her wound on the upper left buttock. About 50 to 60% of this area is fully granulated however from about 10-5 o'clock there is still undermining. UNFORTUNATELY she also arrives in clinic today with a wound over her right fibular head. According to her daughter has been there for about 2 weeks. She wonders whether there is an abrasion although no concrete history of this. Given her frail status this is probably most likely a pressure ulcer over a bony prominence [fibular head] 8/31 unfortunately the upper left buttock pressure ulcer stage IV is not as optimistically better as I was  hoping. The granulation that I identified on 12/23/20 is no longer adherent at the inferior pole the undermining is quite extensive but there is no palpable bone. No evidence of surrounding infection. There may be some improvement in the undermining measurements We are still dealing with the additional wound we identified 2 weeks ago on the right fibular head as well we have been using silver alginate here 9/14; 1 month follow-up. She has now had the wound VAC on for probably 2 months. No major change here if she still has the tunneling area at 12:00 and undermining from about 6-12. Surface of the wound does not look healthy. We use MolecuLight to look at the wound surface which actually looked negative however she has extensive immunofluorescence in the wound edge from about 12-6 o'clock this actually extends beyond the visible wound margin by quite amount. 9/27; no major change in the left buttock wound. There is still undermining however no exposed bone. Granulation looks reasonable. Right fibular head is much  smaller We are using Hydrofera Blue on the right fibular head silver alginate on the buttock area 10/12; left buttock wound is large with undermining. I think this is about the same as last time granulation looks reasonable there is no exposed bone. The area on the fibular head is almost completely closed 11/30; left buttock wound large wound with undermining superiorly. As her daughter points out there is some change in the granulation superiorly and more darker red color also some raised areas of hypergranulation. I cannot tell that the area is infected. We are using silver alginate she is changing this daily On the right fibular head the area is just about closed 12/28; the patient's wound over her right fibular head is closed. Her left buttock ulcer looks actually a little better. Measurements are slightly improved. There is no exposed bone. The patient's daughter came in asking me about more aggressive options to attempt to get closure of the subcu wound including surgery. I thought we had mostly agreed that this would be a palliative type setting and that I did not expect this to actually close nevertheless I tried to answer her questions. I do not think the patient is a candidate for a myocutaneous flap. She is simply too frail and I doubt plastic surgery would consider her a viable candidate. She might be a candidate for an advanced treatment option like Oasis which sometimes can stimulate granulation to get these wounds to gradually close. I told her in order for this to make sense she would need probably a Foley catheter to control the incontinence and unfortunately weekly visits which would be difficult on this patient 1/25; 1 month follow-up. The patient has a large stage IV left buttock ulcer. Substantially deep but no exposed bone. She has advanced dementia. I do not think she is a candidate for anything aggressive and I follow her on a palliative basis. I been over this with her  daughter many times. We have been using silver alginate backing ABDs. She has a level 3 surface on her mattress in spite of this she developed some degree of erythema today on the upper left pelvis 2/22; 1 month follow-up. Large left buttock pressure ulcer. We've been using silver alginate as a palliative dressing. Essentially deep wound with no exposed bone. No evidence of surrounding infection. This patient has advanced/preterminal dementia. Her daughter reports that he had a time where she was not eating very well although she seems to be improved lately. she does  not appear to be systemically unwell 07/28/2021: 1 month follow-up. The overall wound dimensions are little bit smaller but she does have significant undermining and epibole. They have been using silver alginate in the wound. There is a small amount of slough buildup. The daughter-in-law that is with her today also expressed concern about some discoloration on the patient's left trochanter and right fibular head. 09/08/2021: 6-week follow-up. No significant change to the left buttock ulcer. There is a bit of slough buildup on the surface but it is otherwise clean without significant drainage or odor. The left trochanter looks like it may have partially broken down and then subsequently healed without intervention; there is some eschar overlying this and when I removed it, there was fresh pink tissue but no opening. On the right fibular head, this site has opened and there are 2 small wounds with a bit of slough accumulation. No odor or drainage. 10/20/2021: 6-week follow-up. No real change to the left buttock ulcer. The left trochanter is open now. It is fairly superficial but does extend into the fat layer. The right fibular head is clean and limited to the skin. She has a new area of tissue breakdown overlying her sacrum, just medial to her large left buttock ulcer. This looks like it is secondary to shear. Electronic Signature(s) Signed:  10/20/2021 11:00:46 AM By: Fredirick Maudlin MD FACS Entered By: Fredirick Maudlin on 10/20/2021 11:00:46 -------------------------------------------------------------------------------- Physical Exam Details Patient Name: Date of Service: [name], [name] 10/20/2021 9:30 A M Medical Record Number: [id number] Patient Account Number: [id number] Date of Birth/Sex: Treating RN: [birth year] (86 y.o. [name] Primary Care Provider: [name] Other Clinician: Referring Provider: Treating Provider/Extender: Carmie End in Treatment: 33 Constitutional Hypertensive, asymptomatic. . . . No acute distress.Marland Kitchen Respiratory Normal work of breathing on room air.. Notes 10/20/2021: No real change to the left buttock ulcer. The left trochanter is open now. It is fairly superficial but does extend into the fat layer. The right fibular head is clean and limited to the skin. She has a new area of tissue breakdown overlying her sacrum, just medial to her large left buttock ulcer. This looks like it is secondary to shear. Electronic Signature(s) Signed: 10/20/2021 11:02:25 AM By: Fredirick Maudlin MD FACS Entered By: Fredirick Maudlin on 10/20/2021 11:02:25 -------------------------------------------------------------------------------- Physician Orders Details Patient Name: Date of Service: [name], [name] 10/20/2021 9:30 A M Medical Record Number: [id number] Patient Account Number: [id number] Date of Birth/Sex: Treating RN: 28-Sep-1935 (86 y.o. [name] Primary Care Provider: [name] Other Clinician: Referring Provider: Treating Provider/Extender: Carmie End in Treatment: 78 Verbal / Phone Orders: No Diagnosis Coding ICD-10 Coding Code Description [phone] Pressure ulcer of left buttock, stage 4 E11.9 Type 2 diabetes mellitus without complications A35.573 Pressure-induced deep tissue damage of left hip L89.896  Pressure-induced deep tissue damage of other site Follow-up Appointments ppointment in: - Next appointment in 6 weeks**** Dr Celine Ahr**** (Room 3) June 26th at 9:30am Return A Bathing/ Shower/ Hygiene May shower and wash wound with soap and water. - prior to dressing change Off-Loading Low air-loss mattress (Group 2) Turn and reposition every 2 hours Additional Orders / Instructions Follow Nutritious Diet - 100-120g of Protein Non Wound Condition Bilateral Lower Extremities Protect area with: - Bilateral Lateral Knees , and Right Trochanter. Protect with Silicone Foam Borders or ABD pads Home Health No change in wound care orders this week; continue Home Health for wound care. May utilize formulary equivalent dressing for  wound treatment orders unless otherwise specified. Other Home Health Orders/Instructions: - [facility] Wound Treatment Wound #2 - Gluteus Wound Laterality: Left Cleanser: Soap and Water Every Other Day/30 Days Discharge Instructions: May shower and wash wound with dial antibacterial soap and water prior to dressing change. Cleanser: Wound Cleanser Every Other Day/30 Days Discharge Instructions: Cleanse the wound with wound cleanser prior to applying a clean dressing using gauze sponges, not tissue or cotton balls. Prim Dressing: KerraCel Ag Gelling Fiber Dressing, 4x5 in (silver alginate) (DME) (Generic) Every Other Day/30 Days ary Discharge Instructions: Apply silver alginate to wound bed as instructed Secondary Dressing: Bordered Gauze, 4x4 in (DME) (Generic) Every Other Day/30 Days Discharge Instructions: Apply over primary dressing as directed. Wound #4 - Lower Leg Wound Laterality: Right, Lateral, Proximal Cleanser: Soap and Water Every Other Day/30 Days Discharge Instructions: May shower and wash wound with dial antibacterial soap and water prior to dressing change. Cleanser: Wound Cleanser Every Other Day/30 Days Discharge Instructions: Cleanse the wound with  wound cleanser prior to applying a clean dressing using gauze sponges, not tissue or cotton balls. Prim Dressing: KerraCel Ag Gelling Fiber Dressing, 4x5 in (silver alginate) (Generic) Every Other Day/30 Days ary Discharge Instructions: Apply silver alginate to wound bed as instructed Prim Dressing: Xeroform Gauze Dressing, 2x2 (in/in) HBD (DME) (Generic) Every Other Day/30 Days ary Secondary Dressing: Bordered Gauze, 4x4 in (DME) (Generic) Every Other Day/30 Days Discharge Instructions: Apply over primary dressing as directed. Wound #5 - Gluteus Wound Laterality: Right Cleanser: Soap and Water Every Other Day/30 Days Discharge Instructions: May shower and wash wound with dial antibacterial soap and water prior to dressing change. Cleanser: Wound Cleanser Every Other Day/30 Days Discharge Instructions: Cleanse the wound with wound cleanser prior to applying a clean dressing using gauze sponges, not tissue or cotton balls. Prim Dressing: KerraCel Ag Gelling Fiber Dressing, 4x5 in (silver alginate) (DME) (Generic) Every Other Day/30 Days ary Discharge Instructions: Apply silver alginate to wound bed as instructed Secondary Dressing: Bordered Gauze, 4x4 in (DME) (Generic) Every Other Day/30 Days Discharge Instructions: Apply over primary dressing as directed. Wound #6 - Trochanter Wound Laterality: Right Cleanser: Soap and Water Every Other Day/30 Days Discharge Instructions: May shower and wash wound with dial antibacterial soap and water prior to dressing change. Cleanser: Wound Cleanser Every Other Day/30 Days Discharge Instructions: Cleanse the wound with wound cleanser prior to applying a clean dressing using gauze sponges, not tissue or cotton balls. Prim Dressing: KerraCel Ag Gelling Fiber Dressing, 4x5 in (silver alginate) (DME) (Generic) Every Other Day/30 Days ary Discharge Instructions: Apply silver alginate to wound bed as instructed Secondary Dressing: Bordered Gauze, 4x4 in (DME)  (Generic) Every Other Day/30 Days Discharge Instructions: Apply over primary dressing as directed. Electronic Signature(s) Signed: 10/20/2021 11:38:41 AM By: Fredirick Maudlin MD FACS Signed: 10/20/2021 12:40:03 PM By: Dellie Catholic RN Entered By: Dellie Catholic on 10/20/2021 11:03:35 -------------------------------------------------------------------------------- Problem List Details Patient Name: Date of Service: [name] 10/20/2021 9:30 A M Medical Record Number: [id number] Patient Account Number: [id number] Date of Birth/Sex: Treating RN: Aug 02, 1935 (86 y.o. [name] Primary Care Provider: [name] Other Clinician: Referring Provider: Treating Provider/Extender: Carmie End in Treatment: 31 Active Problems ICD-10 Encounter Code Description Active Date MDM Diagnosis L89.324 Pressure ulcer of left buttock, stage 4 11/24/2020 No Yes E11.9 Type 2 diabetes mellitus without complications 06/13/2351 No Yes L89.226 Pressure-induced deep tissue damage of left hip 07/28/2021 No Yes L89.896 Pressure-induced deep tissue damage of other site 07/28/2021  No Yes Inactive Problems ICD-10 Code Description Active Date Inactive Date L89.320 Pressure ulcer of left buttock, unstageable 10/13/2020 10/13/2020 L89.93 Pressure ulcer of unspecified site, stage 3 12/23/2020 12/23/2020 Resolved Problems Electronic Signature(s) Signed: 10/20/2021 10:58:58 AM By: Fredirick Maudlin MD FACS Entered By: Fredirick Maudlin on 10/20/2021 10:58:58 -------------------------------------------------------------------------------- Progress Note Details Patient Name: Date of Service: [name] 10/20/2021 9:30 A M Medical Record Number: [id number] Patient Account Number: [id number] Date of Birth/Sex: Treating RN: 10-May-1935 (86 y.o. [name] Primary Care Provider: [name] Other Clinician: Referring Provider: Treating Provider/Extender: Carmie End in Treatment: 70 Subjective Chief Complaint Information obtained from Patient LEFT buttocks pressure ulcer History of Present Illness (HPI) Admission 5/27 Ms. Reed Eifert is an 86 year old female with a past medical history of type 2 diabetes on oral agents, hypertension and dementia that presents today to the clinic for a wound to her right buttocks. Her daughter-in-law is present today and helps provide the history. This issue has been going on for 2 to 3 weeks now. She states she developed an eschar about 1 week ago. She has been taking doxycycline for possible soft tissue infection to the area prescribed by her primary care provider. Patient currently denies any signs of infection. 6/3; patient admitted to the clinic last week. She has a large necrotic area on her left buttock. They use Santyl last week. On arrival this visit she had completely necrotic surface with open to subcutaneous tissue. The surface was completely nonviable. They have been using Santyl. She apparently had been on doxycycline which she is just finishing prescribed by her primary doctor for possible soft tissue infection 6/7; patient with a large necrotic wound over her left buttock. Aggressive debridement last week. Culture of this grew Proteus unfortunately would not of been covered well or at least predictably by the doxycycline that her primary doctor put her on. I have therefore put her on Augmentin suspension 3 times a day. We are using silver alginate as the primary dressing while we deal with the infection. Her daughter has a list of concerns including swallowing difficulties, concerns about aspiration. The patient has advanced dementia. If this is Alzheimer's disease it is at its preterminal stages. I went over that swallowing difficulties are part of what happens in the preterminal stages of Alzheimer's disease. Nevertheless we will family members seems to want to pursue a  very aggressive course 6/21 large wound over her left buttock. She has completed the antibiotics I gave her [Augmentin]. We are using silver alginate while we are dealing with the infection and also to help with the drainage In general the wound looks better she has healthy granulation over perhaps 70% of it. Superiorly there is still necrotic debris I did not attempt to debride this today The patient has advanced dementia. I do not know that she could tolerate a wound VAC. She also has double incontinence which would make that difficult. Nevertheless she has been cared for diligently by her family. She is apparently eating well and may be 2 to 3 hours on this area all day 7/5; 2-week follow-up. She continues with a nice improvement in overall condition of the wound bed. The undermining area has still some adherent surface slough. We have been using silver alginate because of the drainage and possibility of at least superficial wound infection/colonization. Although I said in previous notes I wondered whether she could tolerate the wound VAC they have done so well with this wound at home I  do not want to necessarily rule her out for a wound VAC and I am going to direct the start of a trial of wound VAC therapy if this can be covered by insurance, if we have home health support to change it at all 7/19; we did not get very far with the wound VAC because my original ICD 10 said this was unstageable. As usual this represents a considerable frustration because we would not of ordered a wound VAC if the wound was still unstageable. This currently represents a stage IV staging. This is all the way down through muscle layer. There is a rim of tissue over the bone and no palpable exposed bone but this is a deep wound. Fortunately at present no evidence of infection. We have been using silver collagen with backing wet-to-dry. The wound is really no better today but no worse 8/2; patient presents for 2-week  follow-up. She has been using the wound VAC for the past week. There has been some issues with keeping the drape in place however home health comes out to reapply the drape when this happens. Overall there are no issues or complaints today. No signs of infection. 8/17; patient has been using the wound VAC on her wound on the upper left buttock. About 50 to 60% of this area is fully granulated however from about 10-5 o'clock there is still undermining. UNFORTUNATELY she also arrives in clinic today with a wound over her right fibular head. According to her daughter has been there for about 2 weeks. She wonders whether there is an abrasion although no concrete history of this. Given her frail status this is probably most likely a pressure ulcer over a bony prominence [fibular head] 8/31 unfortunately the upper left buttock pressure ulcer stage IV is not as optimistically better as I was hoping. The granulation that I identified on 12/23/20 is no longer adherent at the inferior pole the undermining is quite extensive but there is no palpable bone. No evidence of surrounding infection. There may be some improvement in the undermining measurements We are still dealing with the additional wound we identified 2 weeks ago on the right fibular head as well we have been using silver alginate here 9/14; 1 month follow-up. She has now had the wound VAC on for probably 2 months. No major change here if she still has the tunneling area at 12:00 and undermining from about 6-12. Surface of the wound does not look healthy. We use MolecuLight to look at the wound surface which actually looked negative however she has extensive immunofluorescence in the wound edge from about 12-6 o'clock this actually extends beyond the visible wound margin by quite amount. 9/27; no major change in the left buttock wound. There is still undermining however no exposed bone. Granulation looks reasonable. oo Right fibular head is much  smaller We are using Hydrofera Blue on the right fibular head silver alginate on the buttock area 10/12; left buttock wound is large with undermining. I think this is about the same as last time granulation looks reasonable there is no exposed bone. The area on the fibular head is almost completely closed 11/30; left buttock wound large wound with undermining superiorly. As her daughter points out there is some change in the granulation superiorly and more darker red color also some raised areas of hypergranulation. I cannot tell that the area is infected. We are using silver alginate she is changing this daily On the right fibular head the area is just about  closed 12/28; the patient's wound over her right fibular head is closed. Her left buttock ulcer looks actually a little better. Measurements are slightly improved. There is no exposed bone. The patient's daughter came in asking me about more aggressive options to attempt to get closure of the subcu wound including surgery. I thought we had mostly agreed that this would be a palliative type setting and that I did not expect this to actually close nevertheless I tried to answer her questions. I do not think the patient is a candidate for a myocutaneous flap. She is simply too frail and I doubt plastic surgery would consider her a viable candidate. She might be a candidate for an advanced treatment option like Oasis which sometimes can stimulate granulation to get these wounds to gradually close. I told her in order for this to make sense she would need probably a Foley catheter to control the incontinence and unfortunately weekly visits which would be difficult on this patient 1/25; 1 month follow-up. The patient has a large stage IV left buttock ulcer. Substantially deep but no exposed bone. She has advanced dementia. I do not think she is a candidate for anything aggressive and I follow her on a palliative basis. I been over this with her  daughter many times. We have been using silver alginate backing ABDs. She has a level 3 surface on her mattress in spite of this she developed some degree of erythema today on the upper left pelvis 2/22; 1 month follow-up. Large left buttock pressure ulcer. We've been using silver alginate as a palliative dressing. Essentially deep wound with no exposed bone. No evidence of surrounding infection. This patient has advanced/preterminal dementia. Her daughter reports that he had a time where she was not eating very well although she seems to be improved lately. she does not appear to be systemically unwell 07/28/2021: 1 month follow-up. The overall wound dimensions are little bit smaller but she does have significant undermining and epibole. They have been using silver alginate in the wound. There is a small amount of slough buildup. The daughter-in-law that is with her today also expressed concern about some discoloration on the patient's left trochanter and right fibular head. 09/08/2021: 6-week follow-up. No significant change to the left buttock ulcer. There is a bit of slough buildup on the surface but it is otherwise clean without significant drainage or odor. The left trochanter looks like it may have partially broken down and then subsequently healed without intervention; there is some eschar overlying this and when I removed it, there was fresh pink tissue but no opening. On the right fibular head, this site has opened and there are 2 small wounds with a bit of slough accumulation. No odor or drainage. 10/20/2021: 6-week follow-up. No real change to the left buttock ulcer. The left trochanter is open now. It is fairly superficial but does extend into the fat layer. The right fibular head is clean and limited to the skin. She has a new area of tissue breakdown overlying her sacrum, just medial to her large left buttock ulcer. This looks like it is secondary to shear. Patient History Unable to  Obtain Patient History due to Dementia. Information obtained from Patient. Family History Stroke - Siblings, No family history of Cancer, Diabetes, Heart Disease, Hereditary Spherocytosis, Hypertension, Kidney Disease, Lung Disease, Seizures, Thyroid Problems, Tuberculosis. Social History Never smoker, Marital Status - Divorced, Alcohol Use - Never, Drug Use - No History, Caffeine Use - Never. Medical History Eyes Denies  history of Cataracts, Glaucoma, Optic Neuritis Ear/Nose/Mouth/Throat Denies history of Chronic sinus problems/congestion, Middle ear problems Hematologic/Lymphatic Patient has history of Anemia Denies history of Hemophilia, Human Immunodeficiency Virus, Lymphedema, Sickle Cell Disease Respiratory Denies history of Aspiration, Asthma, Chronic Obstructive Pulmonary Disease (COPD), Pneumothorax, Sleep Apnea, Tuberculosis Cardiovascular Patient has history of Deep Vein Thrombosis, Hypertension Denies history of Angina, Arrhythmia, Congestive Heart Failure, Coronary Artery Disease, Hypotension, Myocardial Infarction, Peripheral Arterial Disease, Peripheral Venous Disease, Phlebitis, Vasculitis Gastrointestinal Denies history of Cirrhosis , Colitis, Crohnoos, Hepatitis A, Hepatitis B, Hepatitis C Endocrine Patient has history of Type II Diabetes Denies history of Type I Diabetes Genitourinary Denies history of End Stage Renal Disease Immunological Denies history of Lupus Erythematosus, Raynaudoos, Scleroderma Integumentary (Skin) Denies history of History of Burn Musculoskeletal Denies history of Gout, Rheumatoid Arthritis, Osteoarthritis, Osteomyelitis Neurologic Patient has history of Dementia Denies history of Neuropathy, Quadriplegia, Paraplegia, Seizure Disorder Oncologic Denies history of Received Chemotherapy, Received Radiation Psychiatric Denies history of Anorexia/bulimia, Confinement Anxiety Hospitalization/Surgery History - cholecystectomy. - vaginal  hysterectomy. - vascular surgery. Medical A Surgical History Notes nd Respiratory hx PE Cardiovascular atrial septal aneurysm, hyperlipidemia Genitourinary recent UTi Neurologic CVA Objective Constitutional Hypertensive, asymptomatic. No acute distress.. Vitals Time Taken: 9:47 AM, Height: 68 in, Weight: 110 lbs, BMI: 16.7, Temperature: 98.1 F, Pulse: 94 bpm, Respiratory Rate: 16 breaths/min, Blood Pressure: 188/76 mmHg. Respiratory Normal work of breathing on room air.. General Notes: 10/20/2021: No real change to the left buttock ulcer. The left trochanter is open now. It is fairly superficial but does extend into the fat layer. The right fibular head is clean and limited to the skin. She has a new area of tissue breakdown overlying her sacrum, just medial to her large left buttock ulcer. This looks like it is secondary to shear. Integumentary (Hair, Skin) Wound #2 status is Open. Original cause of wound was Pressure Injury. The date acquired was: 09/18/2020. The wound has been in treatment 54 weeks. The wound is located on the Left Gluteus. The wound measures 4.1cm length x 3.3cm width x 1.5cm depth; 10.626cm^2 area and 15.94cm^3 volume. There is Fat Layer (Subcutaneous Tissue) exposed. There is undermining starting at 12:00 and ending at 12:00 with a maximum distance of 2.3cm. There is a medium amount of serosanguineous drainage noted. The wound margin is epibole. There is large (67-100%) red, pink granulation within the wound bed. There is a small (1-33%) amount of necrotic tissue within the wound bed including Adherent Slough. Wound #4 status is Open. Original cause of wound was Trauma. The date acquired was: 09/08/2021. The wound has been in treatment 6 weeks. The wound is located on the Right,Proximal,Lateral Lower Leg. The wound measures 0.3cm length x 0.3cm width x 0.1cm depth; 0.071cm^2 area and 0.007cm^3 volume. There is Fat Layer (Subcutaneous Tissue) exposed. There is no  tunneling or undermining noted. There is a medium amount of serosanguineous drainage noted. The wound margin is distinct with the outline attached to the wound base. There is small (1-33%) pink granulation within the wound bed. There is a large (67- 100%) amount of necrotic tissue within the wound bed including Adherent Slough. Wound #5 status is Open. Original cause of wound was Shear/Friction. The date acquired was: 09/17/2021. The wound is located on the Right Gluteus. The wound measures 0.6cm length x 1.5cm width x 0.1cm depth; 0.707cm^2 area and 0.071cm^3 volume. There is Fat Layer (Subcutaneous Tissue) exposed. There is no tunneling or undermining noted. There is a medium amount of serosanguineous drainage noted. There  is large (67-100%) red, pink granulation within the wound bed. There is a small (1-33%) amount of necrotic tissue within the wound bed including Eschar and Adherent Slough. Wound #6 status is Open. Original cause of wound was Pressure Injury. The date acquired was: 09/17/2021. The wound is located on the Right Trochanter. The wound measures 0.2cm length x 0.2cm width x 0.1cm depth; 0.031cm^2 area and 0.003cm^3 volume. There is Fat Layer (Subcutaneous Tissue) exposed. There is no tunneling or undermining noted. There is a medium amount of serosanguineous drainage noted. The wound margin is flat and intact. There is small (1-33%) pink granulation within the wound bed. There is a large (67-100%) amount of necrotic tissue within the wound bed including Eschar. Assessment Active Problems ICD-10 Pressure ulcer of left buttock, stage 4 Type 2 diabetes mellitus without complications Pressure-induced deep tissue damage of left hip Pressure-induced deep tissue damage of other site Procedures Wound #2 Pre-procedure diagnosis of Wound #2 is a Pressure Ulcer located on the Left Gluteus . There was a Excisional Skin/Subcutaneous Tissue Debridement with a total area of 13.53 sq cm performed  by Fredirick Maudlin, MD. With the following instrument(s): Curette to remove Viable and Non-Viable tissue/material. Material removed includes Subcutaneous Tissue and Slough and after achieving pain control using Other (Benzocaine 20%). No specimens were taken. A time out was conducted at 10:30, prior to the start of the procedure. A Minimum amount of bleeding was controlled with Pressure. The procedure was tolerated well with a pain level of 0 throughout and a pain level of 0 following the procedure. Post Debridement Measurements: 4.1cm length x 3.1cm width x 1.5cm depth; 14.974cm^3 volume. Post debridement Stage noted as Category/Stage III. Character of Wound/Ulcer Post Debridement is improved. Post procedure Diagnosis Wound #2: Same as Pre-Procedure Wound #4 Pre-procedure diagnosis of Wound #4 is a Pressure Ulcer located on the Right,Proximal,Lateral Lower Leg . There was a Selective/Open Wound Non-Viable Tissue Debridement with a total area of 0.09 sq cm performed by Fredirick Maudlin, MD. With the following instrument(s): Curette to remove Non-Viable tissue/material. Material removed includes Eschar after achieving pain control using Other (Benzocaine 20%). No specimens were taken. A time out was conducted at 10:30, prior to the start of the procedure. A Minimum amount of bleeding was controlled with Pressure. The procedure was tolerated well with a pain level of 0 throughout and a pain level of 0 following the procedure. Post Debridement Measurements: 0.3cm length x 0.3cm width x 0.1cm depth; 0.007cm^3 volume. Post debridement Stage noted as Category/Stage II. Character of Wound/Ulcer Post Debridement is improved. Post procedure Diagnosis Wound #4: Same as Pre-Procedure Wound #5 Pre-procedure diagnosis of Wound #5 is a T be determined located on the Right Gluteus . There was a Excisional Skin/Subcutaneous Tissue Debridement with o a total area of 0.9 sq cm performed by Fredirick Maudlin, MD.  With the following instrument(s): Curette to remove Viable and Non-Viable tissue/material. Material removed includes Subcutaneous Tissue and Slough and after achieving pain control using Other (Benzocaine 20%). No specimens were taken. A time out was conducted at 10:30, prior to the start of the procedure. A Minimum amount of bleeding was controlled with Pressure. The procedure was tolerated well with a pain level of 0 throughout and a pain level of 0 following the procedure. Post Debridement Measurements: 0.6cm length x 1.5cm width x 0.1cm depth; 0.071cm^3 volume. Character of Wound/Ulcer Post Debridement is improved. Post procedure Diagnosis Wound #5: Same as Pre-Procedure Wound #6 Pre-procedure diagnosis of Wound #6 is a T  be determined located on the Right Trochanter . There was a Excisional Skin/Subcutaneous Tissue Debridement o with a total area of 0.04 sq cm performed by Fredirick Maudlin, MD. With the following instrument(s): Curette to remove Viable and Non-Viable tissue/material. Material removed includes Subcutaneous Tissue and Slough and after achieving pain control using Other (Benzocaine 20%). No specimens were taken. A time out was conducted at 10:30, prior to the start of the procedure. A Minimum amount of bleeding was controlled with Pressure. The procedure was tolerated well with a pain level of 0 throughout and a pain level of 0 following the procedure. Post Debridement Measurements: 0.2cm length x 0.2cm width x 0.1cm depth; 0.003cm^3 volume. Character of Wound/Ulcer Post Debridement is improved. Post procedure Diagnosis Wound #6: Same as Pre-Procedure Plan Follow-up Appointments: Return Appointment in: - Next appointment in 6 weeks**** Dr Celine Ahr**** (Room 3) Bathing/ Shower/ Hygiene: May shower and wash wound with soap and water. - prior to dressing change Off-Loading: Low air-loss mattress (Group 2) Turn and reposition every 2 hours Additional Orders /  Instructions: Follow Nutritious Diet - 100-120g of Protein Non Wound Condition: Protect area with: - Bilateral Lateral Knees , and Right Trochanter. Protect with Silicone Foam Borders or ABD pads Home Health: No change in wound care orders this week; continue Home Health for wound care. May utilize formulary equivalent dressing for wound treatment orders unless otherwise specified. Other Home Health Orders/Instructions: - [facility] WOUND #2: - Gluteus Wound Laterality: Left Cleanser: Soap and Water Every Other Day/30 Days Discharge Instructions: May shower and wash wound with dial antibacterial soap and water prior to dressing change. Cleanser: Wound Cleanser Every Other Day/30 Days Discharge Instructions: Cleanse the wound with wound cleanser prior to applying a clean dressing using gauze sponges, not tissue or cotton balls. Prim Dressing: KerraCel Ag Gelling Fiber Dressing, 4x5 in (silver alginate) (DME) (Generic) Every Other Day/30 Days ary Discharge Instructions: Apply silver alginate to wound bed as instructed Secondary Dressing: Bordered Gauze, 4x4 in (DME) (Generic) Every Other Day/30 Days Discharge Instructions: Apply over primary dressing as directed. WOUND #4: - Lower Leg Wound Laterality: Right, Lateral, Proximal Cleanser: Soap and Water Every Other Day/30 Days Discharge Instructions: May shower and wash wound with dial antibacterial soap and water prior to dressing change. Cleanser: Wound Cleanser Every Other Day/30 Days Discharge Instructions: Cleanse the wound with wound cleanser prior to applying a clean dressing using gauze sponges, not tissue or cotton balls. Prim Dressing: KerraCel Ag Gelling Fiber Dressing, 4x5 in (silver alginate) (Generic) Every Other Day/30 Days ary Discharge Instructions: Apply silver alginate to wound bed as instructed Prim Dressing: Xeroform Gauze Dressing, 2x2 (in/in) HBD (DME) (Generic) Every Other Day/30 Days ary Secondary Dressing: Bordered  Gauze, 4x4 in (DME) (Generic) Every Other Day/30 Days Discharge Instructions: Apply over primary dressing as directed. WOUND #5: - Gluteus Wound Laterality: Right Cleanser: Soap and Water Every Other Day/30 Days Discharge Instructions: May shower and wash wound with dial antibacterial soap and water prior to dressing change. Cleanser: Wound Cleanser Every Other Day/30 Days Discharge Instructions: Cleanse the wound with wound cleanser prior to applying a clean dressing using gauze sponges, not tissue or cotton balls. Prim Dressing: KerraCel Ag Gelling Fiber Dressing, 4x5 in (silver alginate) (DME) (Generic) Every Other Day/30 Days ary Discharge Instructions: Apply silver alginate to wound bed as instructed Secondary Dressing: Bordered Gauze, 4x4 in (DME) (Generic) Every Other Day/30 Days Discharge Instructions: Apply over primary dressing as directed. WOUND #6: - Trochanter Wound Laterality: Right Cleanser: Soap and  Water Every Other Day/30 Days Discharge Instructions: May shower and wash wound with dial antibacterial soap and water prior to dressing change. Cleanser: Wound Cleanser Every Other Day/30 Days Discharge Instructions: Cleanse the wound with wound cleanser prior to applying a clean dressing using gauze sponges, not tissue or cotton balls. Prim Dressing: KerraCel Ag Gelling Fiber Dressing, 4x5 in (silver alginate) (DME) (Generic) Every Other Day/30 Days ary Discharge Instructions: Apply silver alginate to wound bed as instructed Secondary Dressing: Bordered Gauze, 4x4 in (DME) (Generic) Every Other Day/30 Days Discharge Instructions: Apply over primary dressing as directed. 10/20/2021: No real change to the left buttock ulcer. The left trochanter is open now. It is fairly superficial but does extend into the fat layer. The right fibular head is clean and limited to the skin. She has a new area of tissue breakdown overlying her sacrum, just medial to her large left buttock ulcer. This  looks like it is secondary to shear. I used a curette to debride biofilm and a small amount of slough from the left buttock ulcer. I debrided eschar and some nonviable subcutaneous tissue from the left trochanter. No debridement was necessary on the right fibular head. I debrided slough and eschar from the new wound on her sacrum. We will continue using silver alginate to all of her wounds with appropriate padding on all of the bony prominences. Follow-up in 6 weeks. Electronic Signature(s) Signed: 10/20/2021 11:04:43 AM By: Fredirick Maudlin MD FACS Entered By: Fredirick Maudlin on 10/20/2021 11:04:43 -------------------------------------------------------------------------------- HxROS Details Patient Name: Date of Service: [name] 10/20/2021 9:30 A M Medical Record Number: [id number] Patient Account Number: [id number] Date of Birth/Sex: Treating RN: 1936/04/26 (86 y.o. [name] Primary Care Provider: [name] Other Clinician: Referring Provider: Treating Provider/Extender: Carmie End in Treatment: 51 Unable to Obtain Patient History due to Dementia Information Obtained From Patient Eyes Medical History: Negative for: Cataracts; Glaucoma; Optic Neuritis Ear/Nose/Mouth/Throat Medical History: Negative for: Chronic sinus problems/congestion; Middle ear problems Hematologic/Lymphatic Medical History: Positive for: Anemia Negative for: Hemophilia; Human Immunodeficiency Virus; Lymphedema; Sickle Cell Disease Respiratory Medical History: Negative for: Aspiration; Asthma; Chronic Obstructive Pulmonary Disease (COPD); Pneumothorax; Sleep Apnea; Tuberculosis Past Medical History Notes: hx PE Cardiovascular Medical History: Positive for: Deep Vein Thrombosis; Hypertension Negative for: Angina; Arrhythmia; Congestive Heart Failure; Coronary Artery Disease; Hypotension; Myocardial Infarction; Peripheral Arterial Disease; Peripheral  Venous Disease; Phlebitis; Vasculitis Past Medical History Notes: atrial septal aneurysm, hyperlipidemia Gastrointestinal Medical History: Negative for: Cirrhosis ; Colitis; Crohns; Hepatitis A; Hepatitis B; Hepatitis C Endocrine Medical History: Positive for: Type II Diabetes Negative for: Type I Diabetes Time with diabetes: 15 years Treated with: Oral agents Blood sugar tested every day: Yes Tested : 2 times per day Genitourinary Medical History: Negative for: End Stage Renal Disease Past Medical History Notes: recent UTi Immunological Medical History: Negative for: Lupus Erythematosus; Raynauds; Scleroderma Integumentary (Skin) Medical History: Negative for: History of Burn Musculoskeletal Medical History: Negative for: Gout; Rheumatoid Arthritis; Osteoarthritis; Osteomyelitis Neurologic Medical History: Positive for: Dementia Negative for: Neuropathy; Quadriplegia; Paraplegia; Seizure Disorder Past Medical History Notes: CVA Oncologic Medical History: Negative for: Received Chemotherapy; Received Radiation Psychiatric Medical History: Negative for: Anorexia/bulimia; Confinement Anxiety Immunizations Pneumococcal Vaccine: Received Pneumococcal Vaccination: No Implantable Devices None Hospitalization / Surgery History Type of Hospitalization/Surgery cholecystectomy vaginal hysterectomy vascular surgery Family and Social History Cancer: No; Diabetes: No; Heart Disease: No; Hereditary Spherocytosis: No; Hypertension: No; Kidney Disease: No; Lung Disease: No; Seizures: No; Stroke: Yes - Siblings; Thyroid Problems: No; Tuberculosis:  No; Never smoker; Marital Status - Divorced; Alcohol Use: Never; Drug Use: No History; Caffeine Use: Never; Financial Concerns: No; Food, Clothing or Shelter Needs: No; Support System Lacking: No; Transportation Concerns: No Electronic Signature(s) Signed: 10/20/2021 11:38:41 AM By: Fredirick Maudlin MD FACS Signed: 10/20/2021 12:40:03  PM By: Dellie Catholic RN Entered By: Fredirick Maudlin on 10/20/2021 11:01:52 -------------------------------------------------------------------------------- SuperBill Details Patient Name: Date of Service: [name] 10/20/2021 Medical Record Number: [id number] Patient Account Number: [id number] Date of Birth/Sex: Treating RN: 03-24-1936 (86 y.o. [name] Primary Care Provider: [name] Other Clinician: Referring Provider: Treating Provider/Extender: Carmie End in Treatment: 72 Diagnosis Coding ICD-10 Codes Code Description [phone] Pressure ulcer of left buttock, stage 4 E11.9 Type 2 diabetes mellitus without complications D14.970 Pressure-induced deep tissue damage of left hip L89.896 Pressure-induced deep tissue damage of other site Facility Procedures CPT4 Code: [id number] Description: 50277 - DEB SUBQ TISSUE 20 SQ CM/< ICD-10 Diagnosis Description L89.324 Pressure ulcer of left buttock, stage 4 Modifier: Quantity: 1 CPT4 Code: [id number] Description: 67209 - DEBRIDE WOUND 1ST 20 SQ CM OR < ICD-10 Diagnosis Description L89.226 Pressure-induced deep tissue damage of left hip L89.896 Pressure-induced deep tissue damage of other site Modifier: Quantity: 1 Physician Procedures : CPT4 Code Description Modifier [id number] - WC PHYS LEVEL 3 - EST PT 25 ICD-10 Diagnosis Description L89.324 Pressure ulcer of left buttock, stage 4 L89.226 Pressure-induced deep tissue damage of left hip L89.896 Pressure-induced deep tissue damage  of other site E11.9 Type 2 diabetes mellitus without complications Quantity: 1 : [id number] 11042 - WC PHYS SUBQ TISS 20 SQ CM ICD-10 Diagnosis Description L89.324 Pressure ulcer of left buttock, stage 4 Quantity: 1 : [id number] 81275 - WC PHYS DEBR WO ANESTH 20 SQ CM ICD-10 Diagnosis Description L89.226 Pressure-induced deep tissue damage of left hip L89.896 Pressure-induced deep tissue damage of other  site Quantity: 1 Electronic Signature(s) Signed: 10/20/2021 11:05:09 AM By: Fredirick Maudlin MD FACS Entered By: Fredirick Maudlin on 10/20/2021 11:05:09

## 2021-10-22 DIAGNOSIS — L89222 Pressure ulcer of left hip, stage 2: Secondary | ICD-10-CM | POA: Diagnosis not present

## 2021-10-22 DIAGNOSIS — I890 Lymphedema, not elsewhere classified: Secondary | ICD-10-CM | POA: Diagnosis not present

## 2021-10-22 DIAGNOSIS — L89320 Pressure ulcer of left buttock, unstageable: Secondary | ICD-10-CM | POA: Diagnosis not present

## 2021-10-22 DIAGNOSIS — Z7984 Long term (current) use of oral hypoglycemic drugs: Secondary | ICD-10-CM | POA: Diagnosis not present

## 2021-10-22 DIAGNOSIS — F0390 Unspecified dementia without behavioral disturbance: Secondary | ICD-10-CM | POA: Diagnosis not present

## 2021-10-22 DIAGNOSIS — E1140 Type 2 diabetes mellitus with diabetic neuropathy, unspecified: Secondary | ICD-10-CM | POA: Diagnosis not present

## 2021-10-24 DIAGNOSIS — L89222 Pressure ulcer of left hip, stage 2: Secondary | ICD-10-CM | POA: Diagnosis not present

## 2021-10-24 DIAGNOSIS — M6281 Muscle weakness (generalized): Secondary | ICD-10-CM | POA: Diagnosis not present

## 2021-10-24 DIAGNOSIS — Z8673 Personal history of transient ischemic attack (TIA), and cerebral infarction without residual deficits: Secondary | ICD-10-CM | POA: Diagnosis not present

## 2021-10-24 DIAGNOSIS — Z741 Need for assistance with personal care: Secondary | ICD-10-CM | POA: Diagnosis not present

## 2021-10-24 DIAGNOSIS — Z86711 Personal history of pulmonary embolism: Secondary | ICD-10-CM | POA: Diagnosis not present

## 2021-10-24 DIAGNOSIS — L89320 Pressure ulcer of left buttock, unstageable: Secondary | ICD-10-CM | POA: Diagnosis not present

## 2021-10-24 DIAGNOSIS — Z7984 Long term (current) use of oral hypoglycemic drugs: Secondary | ICD-10-CM | POA: Diagnosis not present

## 2021-10-24 DIAGNOSIS — F0390 Unspecified dementia without behavioral disturbance: Secondary | ICD-10-CM | POA: Diagnosis not present

## 2021-10-24 DIAGNOSIS — Z7901 Long term (current) use of anticoagulants: Secondary | ICD-10-CM | POA: Diagnosis not present

## 2021-10-24 DIAGNOSIS — E1140 Type 2 diabetes mellitus with diabetic neuropathy, unspecified: Secondary | ICD-10-CM | POA: Diagnosis not present

## 2021-10-24 DIAGNOSIS — E785 Hyperlipidemia, unspecified: Secondary | ICD-10-CM | POA: Diagnosis not present

## 2021-10-24 DIAGNOSIS — K5900 Constipation, unspecified: Secondary | ICD-10-CM | POA: Diagnosis not present

## 2021-10-24 DIAGNOSIS — I890 Lymphedema, not elsewhere classified: Secondary | ICD-10-CM | POA: Diagnosis not present

## 2021-10-24 DIAGNOSIS — I10 Essential (primary) hypertension: Secondary | ICD-10-CM | POA: Diagnosis not present

## 2021-10-24 DIAGNOSIS — Z86718 Personal history of other venous thrombosis and embolism: Secondary | ICD-10-CM | POA: Diagnosis not present

## 2021-10-27 DIAGNOSIS — E1140 Type 2 diabetes mellitus with diabetic neuropathy, unspecified: Secondary | ICD-10-CM | POA: Diagnosis not present

## 2021-10-27 DIAGNOSIS — I890 Lymphedema, not elsewhere classified: Secondary | ICD-10-CM | POA: Diagnosis not present

## 2021-10-27 DIAGNOSIS — L89222 Pressure ulcer of left hip, stage 2: Secondary | ICD-10-CM | POA: Diagnosis not present

## 2021-10-27 DIAGNOSIS — Z7984 Long term (current) use of oral hypoglycemic drugs: Secondary | ICD-10-CM | POA: Diagnosis not present

## 2021-10-27 DIAGNOSIS — L89320 Pressure ulcer of left buttock, unstageable: Secondary | ICD-10-CM | POA: Diagnosis not present

## 2021-10-27 DIAGNOSIS — F0390 Unspecified dementia without behavioral disturbance: Secondary | ICD-10-CM | POA: Diagnosis not present

## 2021-11-01 ENCOUNTER — Ambulatory Visit (HOSPITAL_BASED_OUTPATIENT_CLINIC_OR_DEPARTMENT_OTHER): Payer: Medicare Other | Admitting: General Surgery

## 2021-11-04 DIAGNOSIS — E1140 Type 2 diabetes mellitus with diabetic neuropathy, unspecified: Secondary | ICD-10-CM | POA: Diagnosis not present

## 2021-11-04 DIAGNOSIS — F0390 Unspecified dementia without behavioral disturbance: Secondary | ICD-10-CM | POA: Diagnosis not present

## 2021-11-04 DIAGNOSIS — I890 Lymphedema, not elsewhere classified: Secondary | ICD-10-CM | POA: Diagnosis not present

## 2021-11-04 DIAGNOSIS — L89222 Pressure ulcer of left hip, stage 2: Secondary | ICD-10-CM | POA: Diagnosis not present

## 2021-11-04 DIAGNOSIS — L89320 Pressure ulcer of left buttock, unstageable: Secondary | ICD-10-CM | POA: Diagnosis not present

## 2021-11-04 DIAGNOSIS — Z7984 Long term (current) use of oral hypoglycemic drugs: Secondary | ICD-10-CM | POA: Diagnosis not present

## 2021-11-12 DIAGNOSIS — Z7984 Long term (current) use of oral hypoglycemic drugs: Secondary | ICD-10-CM | POA: Diagnosis not present

## 2021-11-12 DIAGNOSIS — F0390 Unspecified dementia without behavioral disturbance: Secondary | ICD-10-CM | POA: Diagnosis not present

## 2021-11-12 DIAGNOSIS — L89320 Pressure ulcer of left buttock, unstageable: Secondary | ICD-10-CM | POA: Diagnosis not present

## 2021-11-12 DIAGNOSIS — E1140 Type 2 diabetes mellitus with diabetic neuropathy, unspecified: Secondary | ICD-10-CM | POA: Diagnosis not present

## 2021-11-12 DIAGNOSIS — I890 Lymphedema, not elsewhere classified: Secondary | ICD-10-CM | POA: Diagnosis not present

## 2021-11-12 DIAGNOSIS — L89222 Pressure ulcer of left hip, stage 2: Secondary | ICD-10-CM | POA: Diagnosis not present

## 2021-11-18 ENCOUNTER — Encounter: Payer: Medicare Other | Admitting: Nurse Practitioner

## 2021-11-19 DIAGNOSIS — F0390 Unspecified dementia without behavioral disturbance: Secondary | ICD-10-CM | POA: Diagnosis not present

## 2021-11-19 DIAGNOSIS — Z7984 Long term (current) use of oral hypoglycemic drugs: Secondary | ICD-10-CM | POA: Diagnosis not present

## 2021-11-19 DIAGNOSIS — I890 Lymphedema, not elsewhere classified: Secondary | ICD-10-CM | POA: Diagnosis not present

## 2021-11-19 DIAGNOSIS — E1140 Type 2 diabetes mellitus with diabetic neuropathy, unspecified: Secondary | ICD-10-CM | POA: Diagnosis not present

## 2021-11-19 DIAGNOSIS — L89222 Pressure ulcer of left hip, stage 2: Secondary | ICD-10-CM | POA: Diagnosis not present

## 2021-11-19 DIAGNOSIS — L89320 Pressure ulcer of left buttock, unstageable: Secondary | ICD-10-CM | POA: Diagnosis not present

## 2021-11-22 DIAGNOSIS — L89320 Pressure ulcer of left buttock, unstageable: Secondary | ICD-10-CM | POA: Diagnosis not present

## 2021-11-22 DIAGNOSIS — Z7984 Long term (current) use of oral hypoglycemic drugs: Secondary | ICD-10-CM | POA: Diagnosis not present

## 2021-11-22 DIAGNOSIS — L89222 Pressure ulcer of left hip, stage 2: Secondary | ICD-10-CM | POA: Diagnosis not present

## 2021-11-22 DIAGNOSIS — E1140 Type 2 diabetes mellitus with diabetic neuropathy, unspecified: Secondary | ICD-10-CM | POA: Diagnosis not present

## 2021-11-22 DIAGNOSIS — I890 Lymphedema, not elsewhere classified: Secondary | ICD-10-CM | POA: Diagnosis not present

## 2021-11-22 DIAGNOSIS — F0390 Unspecified dementia without behavioral disturbance: Secondary | ICD-10-CM | POA: Diagnosis not present

## 2021-11-23 DIAGNOSIS — Z8673 Personal history of transient ischemic attack (TIA), and cerebral infarction without residual deficits: Secondary | ICD-10-CM | POA: Diagnosis not present

## 2021-11-23 DIAGNOSIS — L89320 Pressure ulcer of left buttock, unstageable: Secondary | ICD-10-CM | POA: Diagnosis not present

## 2021-11-23 DIAGNOSIS — Z7901 Long term (current) use of anticoagulants: Secondary | ICD-10-CM | POA: Diagnosis not present

## 2021-11-23 DIAGNOSIS — M6281 Muscle weakness (generalized): Secondary | ICD-10-CM | POA: Diagnosis not present

## 2021-11-23 DIAGNOSIS — I10 Essential (primary) hypertension: Secondary | ICD-10-CM | POA: Diagnosis not present

## 2021-11-23 DIAGNOSIS — K5900 Constipation, unspecified: Secondary | ICD-10-CM | POA: Diagnosis not present

## 2021-11-23 DIAGNOSIS — Z7984 Long term (current) use of oral hypoglycemic drugs: Secondary | ICD-10-CM | POA: Diagnosis not present

## 2021-11-23 DIAGNOSIS — E1140 Type 2 diabetes mellitus with diabetic neuropathy, unspecified: Secondary | ICD-10-CM | POA: Diagnosis not present

## 2021-11-23 DIAGNOSIS — I890 Lymphedema, not elsewhere classified: Secondary | ICD-10-CM | POA: Diagnosis not present

## 2021-11-23 DIAGNOSIS — F0390 Unspecified dementia without behavioral disturbance: Secondary | ICD-10-CM | POA: Diagnosis not present

## 2021-11-23 DIAGNOSIS — Z741 Need for assistance with personal care: Secondary | ICD-10-CM | POA: Diagnosis not present

## 2021-11-23 DIAGNOSIS — Z86718 Personal history of other venous thrombosis and embolism: Secondary | ICD-10-CM | POA: Diagnosis not present

## 2021-11-23 DIAGNOSIS — Z86711 Personal history of pulmonary embolism: Secondary | ICD-10-CM | POA: Diagnosis not present

## 2021-11-23 DIAGNOSIS — L89892 Pressure ulcer of other site, stage 2: Secondary | ICD-10-CM | POA: Diagnosis not present

## 2021-11-23 DIAGNOSIS — E785 Hyperlipidemia, unspecified: Secondary | ICD-10-CM | POA: Diagnosis not present

## 2021-11-23 DIAGNOSIS — L89222 Pressure ulcer of left hip, stage 2: Secondary | ICD-10-CM | POA: Diagnosis not present

## 2021-11-24 DIAGNOSIS — L89320 Pressure ulcer of left buttock, unstageable: Secondary | ICD-10-CM | POA: Diagnosis not present

## 2021-11-24 DIAGNOSIS — Z7984 Long term (current) use of oral hypoglycemic drugs: Secondary | ICD-10-CM | POA: Diagnosis not present

## 2021-11-24 DIAGNOSIS — L89222 Pressure ulcer of left hip, stage 2: Secondary | ICD-10-CM | POA: Diagnosis not present

## 2021-11-24 DIAGNOSIS — L89892 Pressure ulcer of other site, stage 2: Secondary | ICD-10-CM | POA: Diagnosis not present

## 2021-11-24 DIAGNOSIS — E1140 Type 2 diabetes mellitus with diabetic neuropathy, unspecified: Secondary | ICD-10-CM | POA: Diagnosis not present

## 2021-11-24 DIAGNOSIS — F0390 Unspecified dementia without behavioral disturbance: Secondary | ICD-10-CM | POA: Diagnosis not present

## 2021-11-26 DIAGNOSIS — L89222 Pressure ulcer of left hip, stage 2: Secondary | ICD-10-CM | POA: Diagnosis not present

## 2021-11-26 DIAGNOSIS — F0390 Unspecified dementia without behavioral disturbance: Secondary | ICD-10-CM | POA: Diagnosis not present

## 2021-11-26 DIAGNOSIS — L89892 Pressure ulcer of other site, stage 2: Secondary | ICD-10-CM | POA: Diagnosis not present

## 2021-11-26 DIAGNOSIS — Z7984 Long term (current) use of oral hypoglycemic drugs: Secondary | ICD-10-CM | POA: Diagnosis not present

## 2021-11-26 DIAGNOSIS — L89320 Pressure ulcer of left buttock, unstageable: Secondary | ICD-10-CM | POA: Diagnosis not present

## 2021-11-26 DIAGNOSIS — E1140 Type 2 diabetes mellitus with diabetic neuropathy, unspecified: Secondary | ICD-10-CM | POA: Diagnosis not present

## 2021-11-30 NOTE — Progress Notes (Signed)
   This service is provided via telemedicine  No vital signs collected/recorded due to the encounter was a telemedicine visit.   Location of patient (ex: home, work):  Home  Patient consents to a telephone visit: Yes, see telephone visit dated 12/02/21  Location of the provider (ex: office, home):  [place], [place]   Name of any referring provider:  N/A  Names of all persons participating in the telemedicine service and their role in the encounter:  S.Chrae B/CMA, [name], NP, and [name] (daughter in law), Patient   Time spent on call:  11 min with medical assistant

## 2021-12-01 ENCOUNTER — Encounter (HOSPITAL_BASED_OUTPATIENT_CLINIC_OR_DEPARTMENT_OTHER): Payer: Medicare Other | Attending: General Surgery | Admitting: General Surgery

## 2021-12-01 DIAGNOSIS — L89893 Pressure ulcer of other site, stage 3: Secondary | ICD-10-CM | POA: Diagnosis not present

## 2021-12-01 DIAGNOSIS — L89223 Pressure ulcer of left hip, stage 3: Secondary | ICD-10-CM | POA: Diagnosis not present

## 2021-12-01 DIAGNOSIS — L89324 Pressure ulcer of left buttock, stage 4: Secondary | ICD-10-CM | POA: Insufficient documentation

## 2021-12-01 DIAGNOSIS — E119 Type 2 diabetes mellitus without complications: Secondary | ICD-10-CM | POA: Insufficient documentation

## 2021-12-01 DIAGNOSIS — L89323 Pressure ulcer of left buttock, stage 3: Secondary | ICD-10-CM | POA: Diagnosis not present

## 2021-12-01 DIAGNOSIS — L89892 Pressure ulcer of other site, stage 2: Secondary | ICD-10-CM | POA: Diagnosis not present

## 2021-12-01 NOTE — Progress Notes (Signed)
[name] ([id number]) , Visit Report for 12/01/2021 Arrival Information Details Patient Name: Date of Service: [name], [name] 12/01/2021 9:30 A M Medical Record Number: [id number] Patient Account Number: [id number] Date of Birth/Sex: Treating RN: 05-Sep-1935 (86 y.o. [name], Mechele Claude Primary Care Kalyani Maeda: [name] Other Clinician: Referring Jayron Maqueda: Treating Daisy Lites/Extender: Carmie End in Treatment: 69 Visit Information History Since Last Visit Added or deleted any medications: No Patient Arrived: Wheel Chair Any new allergies or adverse reactions: No Arrival Time: 09:38 Had a fall or experienced change in No Accompanied By: daughter-in-law activities of daily living that may affect Transfer Assistance: Manual risk of falls: Patient Identification Verified: Yes Signs or symptoms of abuse/neglect since last visito No Patient Requires Transmission-Based Precautions: No Hospitalized since last visit: No Patient Has Alerts: No Implantable device outside of the clinic excluding No cellular tissue based products placed in the center since last visit: Has Dressing in Place as Prescribed: Yes Pain Present Now: No Electronic Signature(s) Signed: 12/01/2021 5:51:03 PM By: Dellie Catholic RN Entered By: Dellie Catholic on 12/01/2021 09:39:28 -------------------------------------------------------------------------------- Encounter Discharge Information Details Patient Name: Date of Service: [name] 12/01/2021 9:30 [place] Record Number: [id number] Patient Account Number: [id number] Date of Birth/Sex: Treating RN: 09-Feb-1936 (86 y.o. [name] Primary Care Antwane Grose: [name] Other Clinician: Referring Arrionna Serena: Treating Burdette Forehand/Extender: Carmie End in Treatment: 9 Encounter Discharge Information Items Post Procedure Vitals Discharge Condition: Stable Temperature (F):  98.3 Ambulatory Status: Wheelchair Pulse (bpm): 84 Discharge Destination: Home Respiratory Rate (breaths/min): 16 Transportation: Private Auto Blood Pressure (mmHg): 176/77 Accompanied By: daughter-in-law Schedule Follow-up Appointment: Yes Clinical Summary of Care: Patient Declined Electronic Signature(s) Signed: 12/01/2021 5:51:03 PM By: Dellie Catholic RN Entered By: Dellie Catholic on 12/01/2021 17:50:30 -------------------------------------------------------------------------------- Lower Extremity Assessment Details Patient Name: Date of Service: [name], [name] 12/01/2021 9:30 A M Medical Record Number: [id number] Patient Account Number: [id number] Date of Birth/Sex: Treating RN: 1935/12/25 (86 y.o. [name] Primary Care Klark Vanderhoef: [name] Other Clinician: Referring Yu Cragun: Treating Berea Majkowski/Extender: Carmie End in Treatment: 60 Electronic Signature(s) Signed: 12/01/2021 5:51:03 PM By: Dellie Catholic RN Entered By: Dellie Catholic on 12/01/2021 09:40:04 -------------------------------------------------------------------------------- Multi Wound Chart Details Patient Name: Date of Service: [name] 12/01/2021 9:30 A M Medical Record Number: [id number] Patient Account Number: [id number] Date of Birth/Sex: Treating RN: October 19, 1935 (86 y.o. [name] Primary Care Shenna Brissette: [name] Other Clinician: Referring Shasha Buchbinder: Treating Mariapaula Krist/Extender: Carmie End in Treatment: 60 Vital Signs Height(in): 70 Pulse(bpm): 54 Weight(lbs): 110 Blood Pressure(mmHg): 176/77 Body Mass Index(BMI): 16.7 Temperature(F): 98.3 Respiratory Rate(breaths/min): 16 Photos: Left Gluteus Right, Proximal, Lateral Lower Leg Right Gluteus Wound Location: Pressure Injury Trauma Shear/Friction Wounding Event: Pressure Ulcer Pressure Ulcer T be determined o Primary Etiology: Anemia, Deep Vein  Thrombosis, Anemia, Deep Vein Thrombosis, Anemia, Deep Vein Thrombosis, Comorbid History: Hypertension, Type II Diabetes, Hypertension, Type II Diabetes, Hypertension, Type II Diabetes, Dementia Dementia Dementia 09/18/2020 09/08/2021 09/17/2021 Date Acquired: 60 12 6 Weeks of Treatment: Open Open Open Wound Status: No No No Wound Recurrence: 4.1x3x1.5 0.8x0.7x0.1 0.1x0.1x0.1 Measurements L x W x D (cm) 9.66 0.44 0.008 A (cm) : rea 14.491 0.044 0.001 Volume (cm) : 76.80% -272.90% 98.90% % Reduction in A rea: -247.80% -266.70% 98.60% % Reduction in Volume: Starting Position 1 (o'clock): Ending Position 1 (o'clock): 1 Maximum Distance 1 (cm): Yes No No Undermining: Category/Stage III Category/Stage II Full Thickness Without Exposed Classification: Support Structures Medium Medium Medium Exudate  Amount: Serosanguineous Serosanguineous Serosanguineous Exudate Type: red, Contreras red, Contreras red, Contreras Exudate Color: Epibole Distinct, outline attached N/A Wound Margin: Large (67-100%) Small (1-33%) Large (67-100%) Granulation Amount: Red, Pink Pink Red, Pink Granulation Quality: Small (1-33%) Large (67-100%) None Present (0%) Necrotic Amount: Adherent Slough Adherent Slough N/A Necrotic Tissue: Fascia: No Fascia: No Fat Layer (Subcutaneous Tissue): Yes Exposed Structures: Fat Layer (Subcutaneous Tissue): No Fat Layer (Subcutaneous Tissue): No Fascia: No Tendon: No Tendon: No Tendon: No Muscle: No Muscle: No Muscle: No Joint: No Joint: No Joint: No Bone: No Bone: No Bone: No Small (1-33%) Small (1-33%) Large (67-100%) Epithelialization: Debridement - Selective/Open Wound Debridement - Excisional N/A Debridement: Pre-procedure Verification/Time Out 10:02 10:02 N/A Taken: Lidocaine 4% Topical Solution Lidocaine 4% Topical Solution N/A Pain Control: Slough Subcutaneous, Slough N/A Tissue Debrided: Non-Viable Tissue Skin/Subcutaneous Tissue  N/A Level: 12.3 0.56 N/A Debridement A (sq cm): rea Curette Curette N/A Instrument: Minimum Minimum N/A Bleeding: Pressure Pressure N/A Hemostasis A chieved: 0 0 N/A Procedural Pain: 0 0 N/A Post Procedural Pain: Procedure was tolerated well Procedure was tolerated well N/A Debridement Treatment Response: 4.1x3x1.5 0.8x0.7x0.1 N/A Post Debridement Measurements L x W x D (cm) 14.491 0.044 N/A Post Debridement Volume: (cm) Category/Stage III Category/Stage II N/A Post Debridement Stage: Debridement Debridement N/A Procedures Performed: Wound Number: 6 N/A N/A Photos: N/A N/A Left Trochanter N/A N/A Wound Location: Pressure Injury N/A N/A Wounding Event: Pressure Ulcer N/A N/A Primary Etiology: Anemia, Deep Vein Thrombosis, N/A N/A Comorbid History: Hypertension, Type II Diabetes, Dementia 09/17/2021 N/A N/A Date Acquired: 6 N/A N/A Weeks of Treatment: Open N/A N/A Wound Status: No N/A N/A Wound Recurrence: 0.7x0.6x0.1 N/A N/A Measurements L x W x D (cm) 0.33 N/A N/A A (cm) : rea 0.033 N/A N/A Volume (cm) : -964.50% N/A N/A % Reduction in A rea: -1000.00% N/A N/A % Reduction in Volume: No N/A N/A Undermining: Unstageable/Unclassified N/A N/A Classification: Medium N/A N/A Exudate A mount: Serosanguineous N/A N/A Exudate Type: red, Contreras N/A N/A Exudate Color: Flat and Intact N/A N/A Wound Margin: None Present (0%) N/A N/A Granulation A mount: N/A N/A N/A Granulation Quality: Large (67-100%) N/A N/A Necrotic A mount: Eschar N/A N/A Necrotic Tissue: Fat Layer (Subcutaneous Tissue): Yes N/A N/A Exposed Structures: Fascia: No Tendon: No Muscle: No Joint: No Bone: No Small (1-33%) N/A N/A Epithelialization: Debridement - Excisional N/A N/A Debridement: Pre-procedure Verification/Time Out 10:02 N/A N/A Taken: Lidocaine 4% Topical Solution N/A N/A Pain Control: Subcutaneous, Slough N/A N/A Tissue Debrided: Skin/Subcutaneous Tissue  N/A N/A Level: 0.42 N/A N/A Debridement A (sq cm): rea Curette N/A N/A Instrument: Minimum N/A N/A Bleeding: Pressure N/A N/A Hemostasis A chieved: 0 N/A N/A Procedural Pain: 0 N/A N/A Post Procedural Pain: Procedure was tolerated well N/A N/A Debridement Treatment Response: 0.7x0.6x0.1 N/A N/A Post Debridement Measurements L x W x D (cm) 0.033 N/A N/A Post Debridement Volume: (cm) Unstageable/Unclassified N/A N/A Post Debridement Stage: Debridement N/A N/A Procedures Performed: Treatment Notes Electronic Signature(s) Signed: 12/01/2021 10:13:04 AM By: Fredirick Maudlin MD FACS Signed: 12/01/2021 5:51:03 PM By: Dellie Catholic RN Entered By: Fredirick Maudlin on 12/01/2021 10:13:03 -------------------------------------------------------------------------------- Multi-Disciplinary Care Plan Details Patient Name: Date of Service: [name] 12/01/2021 9:30 A M Medical Record Number: [id number] Patient Account Number: [id number] Date of Birth/Sex: Treating RN: Jun 25, 1935 (86 y.o. [name] Primary Care Sashay Felling: [name] Other Clinician: Referring Ruthanna Macchia: Treating Dillinger Aston/Extender: Carmie End in Treatment: 60 Active Inactive Wound/Skin Impairment Nursing Diagnoses: Impaired tissue integrity Goals:  Patient/caregiver will verbalize understanding of skin care regimen Date Initiated: 10/02/2020 Target Resolution Date: 04/07/2022 Goal Status: Active Ulcer/skin breakdown will have a volume reduction of 30% by week 4 Date Initiated: 10/02/2020 Date Inactivated: 01/06/2021 Target Resolution Date: 01/09/2021 Goal Status: Met Interventions: Assess patient/caregiver ability to obtain necessary supplies Assess patient/caregiver ability to perform ulcer/skin care regimen upon admission and as needed Assess ulceration(s) every visit Provide education on ulcer and skin care Treatment Activities: Skin care regimen initiated :  10/02/2020 Topical wound management initiated : 10/02/2020 Notes: 11/10/20 : Goal not yet met, eschar debrided off increasing wound size. Electronic Signature(s) Signed: 12/01/2021 5:51:03 PM By: Dellie Catholic RN Entered By: Dellie Catholic on 12/01/2021 17:49:04 -------------------------------------------------------------------------------- Pain Assessment Details Patient Name: Date of Service: [name], [name] 12/01/2021 9:30 A M Medical Record Number: [id number] Patient Account Number: [id number] Date of Birth/Sex: Treating RN: 08/09/1935 (86 y.o. [name] Primary Care Samreet [place]: [name] Other Clinician: Referring Haidy Kackley: Treating Mirriam Vadala/Extender: Carmie End in Treatment: 77 Active Problems Location of Pain Severity and Description of Pain Patient Has Paino No Site Locations Pain Management and Medication Current Pain Management: Electronic Signature(s) Signed: 12/01/2021 5:51:03 PM By: Dellie Catholic RN Entered By: Dellie Catholic on 12/01/2021 09:39:56 -------------------------------------------------------------------------------- Patient/Caregiver Education Details Patient Name: Date of Service: [name] 7/26/2023andnbsp9:30 [place] Record Number: [id number] Patient Account Number: [id number] Date of Birth/Gender: Treating RN: 02-01-1936 (86 y.o. [name] Primary Care Physician: [name] Other Clinician: Referring Physician: Treating Physician/Extender: Carmie End in Treatment: 42 Education Assessment Education Provided To: Patient Education Topics Provided Wound/Skin Impairment: Methods: Explain/Verbal Responses: Return demonstration correctly Electronic Signature(s) Signed: 12/01/2021 5:51:03 PM By: Dellie Catholic RN Entered By: Dellie Catholic on 12/01/2021 17:49:16 -------------------------------------------------------------------------------- Wound  Assessment Details Patient Name: Date of Service: [name], [name] 12/01/2021 9:30 A M Medical Record Number: [id number] Patient Account Number: [id number] Date of Birth/Sex: Treating RN: 29-Apr-1936 (86 y.o. [name] Primary Care Reonna Finlayson: [name] Other Clinician: Referring Janene Yousuf: Treating Bee Hammerschmidt/Extender: Carmie End in Treatment: 60 Wound Status Wound Number: 2 Primary Pressure Ulcer Etiology: Wound Location: Left Gluteus Wound Status: Open Wounding Event: Pressure Injury Comorbid Anemia, Deep Vein Thrombosis, Hypertension, Type II Date Acquired: 09/18/2020 History: Diabetes, Dementia Weeks Of Treatment: 60 Clustered Wound: No Photos Wound Measurements Length: (cm) 4.1 Width: (cm) 3 Depth: (cm) 1.5 Area: (cm) 9.66 Volume: (cm) 14.491 % Reduction in Area: 76.8% % Reduction in Volume: -247.8% Epithelialization: Small (1-33%) Undermining: Yes Maximum Distance: (cm) 1 Wound Description Classification: Category/Stage III Wound Margin: Epibole Exudate Amount: Medium Exudate Type: Serosanguineous Exudate Color: red, Contreras Foul Odor After Cleansing: No Slough/Fibrino Yes Wound Bed Granulation Amount: Large (67-100%) Exposed Structure Granulation Quality: Red, Pink Fascia Exposed: No Necrotic Amount: Small (1-33%) Fat Layer (Subcutaneous Tissue) Exposed: No Necrotic Quality: Adherent Slough Tendon Exposed: No Muscle Exposed: No Joint Exposed: No Bone Exposed: No Treatment Notes Wound #2 (Gluteus) Wound Laterality: Left Cleanser Soap and Water Discharge Instruction: May shower and wash wound with dial antibacterial soap and water prior to dressing change. Wound Cleanser Discharge Instruction: Cleanse the wound with wound cleanser prior to applying a clean dressing using gauze sponges, not tissue or cotton balls. Peri-Wound Care Topical Primary Dressing KerraCel Ag Gelling Fiber Dressing, 4x5 in (silver  alginate) Discharge Instruction: Apply silver alginate to wound bed as instructed Secondary Dressing Bordered Gauze, 4x4 in Discharge Instruction: Apply over primary dressing as directed. Secured With Compression Wrap Compression Stockings Add-Ons Electronic  Signature(s) Signed: 12/01/2021 5:51:03 PM By: Dellie Catholic RN Entered By: Dellie Catholic on 12/01/2021 09:57:22 -------------------------------------------------------------------------------- Wound Assessment Details Patient Name: Date of Service: [name], [name] 12/01/2021 9:30 A M Medical Record Number: [id number] Patient Account Number: [id number] Date of Birth/Sex: Treating RN: 12/15/1935 (86 y.o. [name] Primary Care Noha Karasik: [name] Other Clinician: Referring August Longest: Treating Sundra Haddix/Extender: Carmie End in Treatment: 60 Wound Status Wound Number: 4 Primary Pressure Ulcer Etiology: Wound Location: Right, Proximal, Lateral Lower Leg Wound Status: Open Wounding Event: Trauma Comorbid Anemia, Deep Vein Thrombosis, Hypertension, Type II Date Acquired: 09/08/2021 History: Diabetes, Dementia Weeks Of Treatment: 12 Clustered Wound: No Photos Wound Measurements Length: (cm) 0.8 Width: (cm) 0.7 Depth: (cm) 0.1 Area: (cm) 0.44 Volume: (cm) 0.044 % Reduction in Area: -272.9% % Reduction in Volume: -266.7% Epithelialization: Small (1-33%) Tunneling: No Undermining: No Wound Description Classification: Category/Stage II Wound Margin: Distinct, outline attached Exudate Amount: Medium Exudate Type: Serosanguineous Exudate Color: red, Contreras Wound Bed Granulation Amount: Small (1-33%) Granulation Quality: Pink Necrotic Amount: Large (67-100%) Necrotic Quality: Adherent Slough Foul Odor After Cleansing: No Slough/Fibrino No Exposed Structure Fascia Exposed: No Fat Layer (Subcutaneous Tissue) Exposed: No Tendon Exposed: No Muscle Exposed: No Joint  Exposed: No Bone Exposed: No Treatment Notes Wound #4 (Lower Leg) Wound Laterality: Right, Lateral, Proximal Cleanser Soap and Water Discharge Instruction: May shower and wash wound with dial antibacterial soap and water prior to dressing change. Wound Cleanser Discharge Instruction: Cleanse the wound with wound cleanser prior to applying a clean dressing using gauze sponges, not tissue or cotton balls. Peri-Wound Care Topical Primary Dressing KerraCel Ag Gelling Fiber Dressing, 4x5 in (silver alginate) Discharge Instruction: Apply silver alginate to wound bed as instructed Xeroform Gauze Dressing, 2x2 (in/in) HBD Secondary Dressing Bordered Gauze, 4x4 in Discharge Instruction: Apply over primary dressing as directed. Secured With Compression Wrap Compression Stockings Environmental education officer) Signed: 12/01/2021 5:51:03 PM By: Dellie Catholic RN Entered By: Dellie Catholic on 12/01/2021 09:43:19 -------------------------------------------------------------------------------- Wound Assessment Details Patient Name: Date of Service: [name], [name] 12/01/2021 9:30 A M Medical Record Number: [id number] Patient Account Number: [id number] Date of Birth/Sex: Treating RN: Aug 09, 1935 (86 y.o. [name] Primary Care Recardo Linn: [name] Other Clinician: Referring Aneta Hendershott: Treating Angad Nabers/Extender: Carmie End in Treatment: 11 Wound Status Wound Number: 5 Primary T be determined o Etiology: Wound Location: Right Gluteus Wound Status: Healed - Epithelialized Wounding Event: Shear/Friction Comorbid Anemia, Deep Vein Thrombosis, Hypertension, Type II Date Acquired: 09/17/2021 History: Diabetes, Dementia Weeks Of Treatment: 6 Clustered Wound: No Photos Wound Measurements Length: (cm) Width: (cm) Depth: (cm) Area: (cm) Volume: (cm) 0 % Reduction in Area: 100% 0 % Reduction in Volume: 100% 0 Epithelialization: Large  (67-100%) 0 Tunneling: No 0 Undermining: No Wound Description Classification: Full Thickness Without Exposed Support Structures Exudate Amount: None Present Foul Odor After Cleansing: No Slough/Fibrino No Wound Bed Granulation Amount: None Present (0%) Exposed Structure Necrotic Amount: None Present (0%) Fascia Exposed: No Fat Layer (Subcutaneous Tissue) Exposed: No Tendon Exposed: No Muscle Exposed: No Joint Exposed: No Bone Exposed: No Electronic Signature(s) Signed: 12/01/2021 5:51:03 PM By: Dellie Catholic RN Entered By: Dellie Catholic on 12/01/2021 10:14:30 -------------------------------------------------------------------------------- Wound Assessment Details Patient Name: Date of Service: [name], [name] 12/01/2021 9:30 A M Medical Record Number: [id number] Patient Account Number: [id number] Date of Birth/Sex: Treating RN: [birth year] (86 y.o. [name] Primary Care Katyra Tomassetti: [name] Other Clinician: Referring Pennie Vanblarcom: Treating Jowan Skillin/Extender: Carmie End in Treatment: [phone]  Wound Status Wound Number: 6 Primary Pressure Ulcer Etiology: Wound Location: Left Trochanter Wound Status: Open Wounding Event: Pressure Injury Comorbid Anemia, Deep Vein Thrombosis, Hypertension, Type II Date Acquired: 09/17/2021 History: Diabetes, Dementia Weeks Of Treatment: 6 Clustered Wound: No Photos Wound Measurements Length: (cm) 0.7 Width: (cm) 0.6 Depth: (cm) 0.1 Area: (cm) 0.33 Volume: (cm) 0.033 % Reduction in Area: -964.5% % Reduction in Volume: -1000% Epithelialization: Small (1-33%) Tunneling: No Undermining: No Wound Description Classification: Unstageable/Unclassified Wound Margin: Flat and Intact Exudate Amount: Medium Exudate Type: Serosanguineous Exudate Color: red, Contreras Foul Odor After Cleansing: No Slough/Fibrino Yes Wound Bed Granulation Amount: None Present (0%) Exposed Structure Necrotic Amount: Large  (67-100%) Fascia Exposed: No Necrotic Quality: Eschar Fat Layer (Subcutaneous Tissue) Exposed: Yes Tendon Exposed: No Muscle Exposed: No Joint Exposed: No Bone Exposed: No Treatment Notes Wound #6 (Trochanter) Wound Laterality: Left Cleanser Soap and Water Discharge Instruction: May shower and wash wound with dial antibacterial soap and water prior to dressing change. Wound Cleanser Discharge Instruction: Cleanse the wound with wound cleanser prior to applying a clean dressing using gauze sponges, not tissue or cotton balls. Peri-Wound Care Topical Primary Dressing Santyl Ointment Discharge Instruction: Apply nickel thick amount to wound bed as instructed Secondary Dressing Bordered Gauze, 4x4 in Discharge Instruction: Apply over primary dressing as directed. Secured With Compression Wrap Compression Stockings Environmental education officer) Signed: 12/01/2021 5:51:03 PM By: Dellie Catholic RN Entered By: Dellie Catholic on 12/01/2021 09:50:37 -------------------------------------------------------------------------------- Vitals Details Patient Name: Date of Service: [name] 12/01/2021 9:30 A M Medical Record Number: [id number] Patient Account Number: [id number] Date of Birth/Sex: Treating RN: 01/02/1936 (86 y.o. [name] Primary Care Amana Bouska: [name] Other Clinician: Referring Shaunika Italiano: Treating Keiyon Plack/Extender: Carmie End in Treatment: 60 Vital Signs Time Taken: 09:38 Temperature (F): 98.3 Height (in): 68 Pulse (bpm): 84 Weight (lbs): 110 Respiratory Rate (breaths/min): 16 Body Mass Index (BMI): 16.7 Blood Pressure (mmHg): 176/77 Reference Range: 80 - 120 mg / dl Electronic Signature(s) Signed: 12/01/2021 5:51:03 PM By: Dellie Catholic RN Entered By: Dellie Catholic on 12/01/2021 09:39:49

## 2021-12-02 ENCOUNTER — Encounter: Payer: Medicare Other | Admitting: Nurse Practitioner

## 2021-12-02 NOTE — Progress Notes (Signed)
[name] ([id number]) , Visit Report for 12/01/2021 Chief Complaint Document Details Patient Name: Date of Service: [name], [name] 12/01/2021 9:30 A M Medical Record Number: [id number] Patient Account Number: [id number] Date of Birth/Sex: Treating RN: 07/29/1935 (86 y.o. [name] Primary Care Provider: [name] Other Clinician: Referring Provider: Treating Provider/Extender: Carmie End in Treatment: 84 Information Obtained from: Patient Chief Complaint LEFT buttocks pressure ulcer Electronic Signature(s) Signed: 12/01/2021 10:13:14 AM By: Fredirick Maudlin MD FACS Entered By: Fredirick Maudlin on 12/01/2021 10:13:13 -------------------------------------------------------------------------------- Debridement Details Patient Name: Date of Service: [name] 12/01/2021 9:30 A M Medical Record Number: [id number] Patient Account Number: [id number] Date of Birth/Sex: Treating RN: Aug 31, 1935 (86 y.o. [name] Primary Care Provider: [name] Other Clinician: Referring Provider: Treating Provider/Extender: Carmie End in Treatment: 60 Debridement Performed for Assessment: Wound #4 Right,Proximal,Lateral Lower Leg Performed By: Physician Fredirick Maudlin, MD Debridement Type: Debridement Level of Consciousness (Pre-procedure): Awake and Alert Pre-procedure Verification/Time Out Yes - 10:02 Taken: Start Time: 10:02 Pain Control: Lidocaine 4% T opical Solution T Area Debrided (L x W): otal 0.8 (cm) x 0.7 (cm) = 0.56 (cm) Tissue and other material debrided: Non-Viable, Slough, Subcutaneous, Slough Level: Skin/Subcutaneous Tissue Debridement Description: Excisional Instrument: Curette Bleeding: Minimum Hemostasis Achieved: Pressure End Time: 10:04 Procedural Pain: 0 Post Procedural Pain: 0 Response to Treatment: Procedure was tolerated well Level of Consciousness (Post- Awake and  Alert procedure): Post Debridement Measurements of Total Wound Length: (cm) 0.8 Stage: Category/Stage II Width: (cm) 0.7 Depth: (cm) 0.1 Volume: (cm) 0.044 Character of Wound/Ulcer Post Debridement: Improved Post Procedure Diagnosis Same as Pre-procedure Electronic Signature(s) Signed: 12/01/2021 12:38:36 PM By: Fredirick Maudlin MD FACS Signed: 12/01/2021 5:51:03 PM By: Dellie Catholic RN Entered By: Dellie Catholic on 12/01/2021 10:07:45 -------------------------------------------------------------------------------- Debridement Details Patient Name: Date of Service: [name] 12/01/2021 9:30 A M Medical Record Number: [id number] Patient Account Number: [id number] Date of Birth/Sex: Treating RN: 06/11/1935 (86 y.o. [name] Primary Care Provider: [name] Other Clinician: Referring Provider: Treating Provider/Extender: Carmie End in Treatment: 60 Debridement Performed for Assessment: Wound #2 Left Gluteus Performed By: Physician Fredirick Maudlin, MD Debridement Type: Debridement Level of Consciousness (Pre-procedure): Awake and Alert Pre-procedure Verification/Time Out Yes - 10:02 Taken: Start Time: 10:02 Pain Control: Lidocaine 4% T opical Solution T Area Debrided (L x W): otal 4.1 (cm) x 3 (cm) = 12.3 (cm) Tissue and other material debrided: Non-Viable, Slough, Biofilm, Slough Level: Non-Viable Tissue Debridement Description: Selective/Open Wound Instrument: Curette Bleeding: Minimum Hemostasis Achieved: Pressure End Time: 10:04 Procedural Pain: 0 Post Procedural Pain: 0 Response to Treatment: Procedure was tolerated well Level of Consciousness (Post- Awake and Alert procedure): Post Debridement Measurements of Total Wound Length: (cm) 4.1 Stage: Category/Stage III Width: (cm) 3 Depth: (cm) 1.5 Volume: (cm) 14.491 Character of Wound/Ulcer Post Debridement: Improved Post Procedure Diagnosis Same as  Pre-procedure Electronic Signature(s) Signed: 12/01/2021 12:38:36 PM By: Fredirick Maudlin MD FACS Signed: 12/01/2021 5:51:03 PM By: Dellie Catholic RN Entered By: Dellie Catholic on 12/01/2021 10:09:06 -------------------------------------------------------------------------------- Debridement Details Patient Name: Date of Service: [name] 12/01/2021 9:30 A M Medical Record Number: [id number] Patient Account Number: [id number] Date of Birth/Sex: Treating RN: 1935-05-31 (86 y.o. [name] Primary Care Provider: [name] Other Clinician: Referring Provider: Treating Provider/Extender: Carmie End in Treatment: 60 Debridement Performed for Assessment: Wound #6 Left Trochanter Performed By: Physician Fredirick Maudlin, MD Debridement Type: Debridement Level of Consciousness (Pre-procedure): Awake and  Alert Pre-procedure Verification/Time Out Yes - 10:02 Taken: Start Time: 10:02 Pain Control: Lidocaine 4% T opical Solution T Area Debrided (L x W): otal 0.7 (cm) x 0.6 (cm) = 0.42 (cm) Tissue and other material debrided: Non-Viable, Slough, Subcutaneous, Slough Level: Skin/Subcutaneous Tissue Debridement Description: Excisional Instrument: Curette Bleeding: Minimum Hemostasis Achieved: Pressure End Time: 10:04 Procedural Pain: 0 Post Procedural Pain: 0 Response to Treatment: Procedure was tolerated well Level of Consciousness (Post- Awake and Alert procedure): Post Debridement Measurements of Total Wound Length: (cm) 0.7 Stage: Unstageable/Unclassified Width: (cm) 0.6 Depth: (cm) 0.1 Volume: (cm) 0.033 Character of Wound/Ulcer Post Debridement: Improved Post Procedure Diagnosis Same as Pre-procedure Electronic Signature(s) Signed: 12/01/2021 12:38:36 PM By: Fredirick Maudlin MD FACS Signed: 12/01/2021 5:51:03 PM By: Dellie Catholic RN Entered By: Dellie Catholic on 12/01/2021  10:10:54 -------------------------------------------------------------------------------- HPI Details Patient Name: Date of Service: [name] 12/01/2021 9:30 A M Medical Record Number: [id number] Patient Account Number: [id number] Date of Birth/Sex: Treating RN: [birth year] (86 y.o. [name] Primary Care Provider: [name] Other Clinician: Referring Provider: Treating Provider/Extender: Carmie End in Treatment: 61 History of Present Illness HPI Description: Admission 5/27 Ms. Frederica Chrestman is an 86 year old female with a past medical history of type 2 diabetes on oral agents, hypertension and dementia that presents today to the clinic for a wound to her right buttocks. Her daughter-in-law is present today and helps provide the history. This issue has been going on for 2 to 3 weeks now. She states she developed an eschar about 1 week ago. She has been taking doxycycline for possible soft tissue infection to the area prescribed by her primary care provider. Patient currently denies any signs of infection. 6/3; patient admitted to the clinic last week. She has a large necrotic area on her left buttock. They use Santyl last week. On arrival this visit she had completely necrotic surface with open to subcutaneous tissue. The surface was completely nonviable. They have been using Santyl. She apparently had been on doxycycline which she is just finishing prescribed by her primary doctor for possible soft tissue infection 6/7; patient with a large necrotic wound over her left buttock. Aggressive debridement last week. Culture of this grew Proteus unfortunately would not of been covered well or at least predictably by the doxycycline that her primary doctor put her on. I have therefore put her on Augmentin suspension 3 times a day. We are using silver alginate as the primary dressing while we deal with the infection. Her daughter has a list of concerns  including swallowing difficulties, concerns about aspiration. The patient has advanced dementia. If this is Alzheimer's disease it is at its preterminal stages. I went over that swallowing difficulties are part of what happens in the preterminal stages of Alzheimer's disease. Nevertheless we will family members seems to want to pursue a very aggressive course 6/21 large wound over her left buttock. She has completed the antibiotics I gave her [Augmentin]. We are using silver alginate while we are dealing with the infection and also to help with the drainage In general the wound looks better she has healthy granulation over perhaps 70% of it. Superiorly there is still necrotic debris I did not attempt to debride this today The patient has advanced dementia. I do not know that she could tolerate a wound VAC. She also has double incontinence which would make that difficult. Nevertheless she has been cared for diligently by her family. She is apparently eating well and may be 2  to 3 hours on this area all day 7/5; 2-week follow-up. She continues with a nice improvement in overall condition of the wound bed. The undermining area has still some adherent surface slough. We have been using silver alginate because of the drainage and possibility of at least superficial wound infection/colonization. Although I said in previous notes I wondered whether she could tolerate the wound VAC they have done so well with this wound at home I do not want to necessarily rule her out for a wound VAC and I am going to direct the start of a trial of wound VAC therapy if this can be covered by insurance, if we have home health support to change it at all 7/19; we did not get very far with the wound VAC because my original ICD 10 said this was unstageable. As usual this represents a considerable frustration because we would not of ordered a wound VAC if the wound was still unstageable. This currently represents a stage IV  staging. This is all the way down through muscle layer. There is a rim of tissue over the bone and no palpable exposed bone but this is a deep wound. Fortunately at present no evidence of infection. We have been using silver collagen with backing wet-to-dry. The wound is really no better today but no worse 8/2; patient presents for 2-week follow-up. She has been using the wound VAC for the past week. There has been some issues with keeping the drape in place however home health comes out to reapply the drape when this happens. Overall there are no issues or complaints today. No signs of infection. 8/17; patient has been using the wound VAC on her wound on the upper left buttock. About 50 to 60% of this area is fully granulated however from about 10-5 o'clock there is still undermining. UNFORTUNATELY she also arrives in clinic today with a wound over her right fibular head. According to her daughter has been there for about 2 weeks. She wonders whether there is an abrasion although no concrete history of this. Given her frail status this is probably most likely a pressure ulcer over a bony prominence [fibular head] 8/31 unfortunately the upper left buttock pressure ulcer stage IV is not as optimistically better as I was hoping. The granulation that I identified on 12/23/20 is no longer adherent at the inferior pole the undermining is quite extensive but there is no palpable bone. No evidence of surrounding infection. There may be some improvement in the undermining measurements We are still dealing with the additional wound we identified 2 weeks ago on the right fibular head as well we have been using silver alginate here 9/14; 1 month follow-up. She has now had the wound VAC on for probably 2 months. No major change here if she still has the tunneling area at 12:00 and undermining from about 6-12. Surface of the wound does not look healthy. We use MolecuLight to look at the wound surface which actually  looked negative however she has extensive immunofluorescence in the wound edge from about 12-6 o'clock this actually extends beyond the visible wound margin by quite amount. 9/27; no major change in the left buttock wound. There is still undermining however no exposed bone. Granulation looks reasonable. Right fibular head is much smaller We are using Hydrofera Blue on the right fibular head silver alginate on the buttock area 10/12; left buttock wound is large with undermining. I think this is about the same as last time granulation looks reasonable  there is no exposed bone. The area on the fibular head is almost completely closed 11/30; left buttock wound large wound with undermining superiorly. As her daughter points out there is some change in the granulation superiorly and more darker red color also some raised areas of hypergranulation. I cannot tell that the area is infected. We are using silver alginate she is changing this daily On the right fibular head the area is just about closed 12/28; the patient's wound over her right fibular head is closed. Her left buttock ulcer looks actually a little better. Measurements are slightly improved. There is no exposed bone. The patient's daughter came in asking me about more aggressive options to attempt to get closure of the subcu wound including surgery. I thought we had mostly agreed that this would be a palliative type setting and that I did not expect this to actually close nevertheless I tried to answer her questions. I do not think the patient is a candidate for a myocutaneous flap. She is simply too frail and I doubt plastic surgery would consider her a viable candidate. She might be a candidate for an advanced treatment option like Oasis which sometimes can stimulate granulation to get these wounds to gradually close. I told her in order for this to make sense she would need probably a Foley catheter to control the incontinence and  unfortunately weekly visits which would be difficult on this patient 1/25; 1 month follow-up. The patient has a large stage IV left buttock ulcer. Substantially deep but no exposed bone. She has advanced dementia. I do not think she is a candidate for anything aggressive and I follow her on a palliative basis. I been over this with her daughter many times. We have been using silver alginate backing ABDs. She has a level 3 surface on her mattress in spite of this she developed some degree of erythema today on the upper left pelvis 2/22; 1 month follow-up. Large left buttock pressure ulcer. We've been using silver alginate as a palliative dressing. Essentially deep wound with no exposed bone. No evidence of surrounding infection. This patient has advanced/preterminal dementia. Her daughter reports that he had a time where she was not eating very well although she seems to be improved lately. she does not appear to be systemically unwell 07/28/2021: 1 month follow-up. The overall wound dimensions are little bit smaller but she does have significant undermining and epibole. They have been using silver alginate in the wound. There is a small amount of slough buildup. The daughter-in-law that is with her today also expressed concern about some discoloration on the patient's left trochanter and right fibular head. 09/08/2021: 6-week follow-up. No significant change to the left buttock ulcer. There is a bit of slough buildup on the surface but it is otherwise clean without significant drainage or odor. The left trochanter looks like it may have partially broken down and then subsequently healed without intervention; there is some eschar overlying this and when I removed it, there was fresh pink tissue but no opening. On the right fibular head, this site has opened and there are 2 small wounds with a bit of slough accumulation. No odor or drainage. 10/20/2021: 6-week follow-up. No real change to the left buttock  ulcer. The left trochanter is open now. It is fairly superficial but does extend into the fat layer. The right fibular head is clean and limited to the skin. She has a new area of tissue breakdown overlying her sacrum, just medial to  her large left buttock ulcer. This looks like it is secondary to shear. 12/01/2021: 6-week follow-up. The left buttock ulcer remains unchanged. There is a light layer of slough and biofilm on the surface. The left trochanter has a heavy layer of eschar on it. The right fibular head wound is deeper today and has heavy slough accumulation. The area of tissue breakdown on her sacrum that was seen at her last visit has healed. Electronic Signature(s) Signed: 12/01/2021 10:14:24 AM By: Fredirick Maudlin MD FACS Entered By: Fredirick Maudlin on 12/01/2021 10:14:24 -------------------------------------------------------------------------------- Physical Exam Details Patient Name: Date of Service: [name], [name] 12/01/2021 9:30 A M Medical Record Number: [id number] Patient Account Number: [id number] Date of Birth/Sex: Treating RN: 08-May-1936 (86 y.o. [name] Primary Care Provider: [name] Other Clinician: Referring Provider: Treating Provider/Extender: Carmie End in Treatment: 24 Constitutional Hypertensive, asymptomatic. . . . No acute distress.. Notes 12/01/2021: The left buttock ulcer remains unchanged. There is a light layer of slough and biofilm on the surface. The left trochanter has a heavy layer of eschar on it. The right fibular head wound is deeper today and has heavy slough accumulation. The area of tissue breakdown on her sacrum that was seen at her last visit has healed. Electronic Signature(s) Signed: 12/01/2021 10:16:27 AM By: Fredirick Maudlin MD FACS Entered By: Fredirick Maudlin on 12/01/2021 10:16:27 -------------------------------------------------------------------------------- Physician Orders  Details Patient Name: Date of Service: YERALDINE, FORNEY 12/01/2021 9:30 A M Medical Record Number: [id number] Patient Account Number: [id number] Date of Birth/Sex: Treating RN: 1935-12-19 (86 y.o. [name] Primary Care Provider: [name] Other Clinician: Referring Provider: Treating Provider/Extender: Carmie End in Treatment: 23 Verbal / Phone Orders: No Diagnosis Coding ICD-10 Coding Code Description [phone] Pressure ulcer of left buttock, stage 4 E11.9 Type 2 diabetes mellitus without complications M22.633 Pressure ulcer of left hip, stage 3 L89.893 Pressure ulcer of other site, stage 3 Follow-up Appointments ppointment in: - Next appointment in 6 weeks**** Dr Celine Ahr**** (Room 3) Return A Bathing/ Shower/ Hygiene May shower and wash wound with soap and water. - prior to dressing change Off-Loading Low air-loss mattress (Group 2) Turn and reposition every 2 hours Additional Orders / Instructions Follow Nutritious Diet - 100-120g of Protein Non Wound Condition Bilateral Lower Extremities Protect area with: - Bilateral Lateral Knees , and Right Trochanter. Protect with Silicone Foam Borders or ABD pads [place] wound care orders this week; continue Home Health for wound care. May utilize formulary equivalent dressing for wound treatment orders unless otherwise specified. - Santyl to Left Trochanter. Other Home Health Orders/Instructions: - [facility] Wound Treatment Wound #2 - Gluteus Wound Laterality: Left Cleanser: Soap and Water Every Other Day/30 Days Discharge Instructions: May shower and wash wound with dial antibacterial soap and water prior to dressing change. Cleanser: Wound Cleanser Every Other Day/30 Days Discharge Instructions: Cleanse the wound with wound cleanser prior to applying a clean dressing using gauze sponges, not tissue or cotton balls. Prim Dressing: KerraCel Ag Gelling Fiber Dressing, 4x5 in (silver  alginate) (Generic) Every Other Day/30 Days ary Discharge Instructions: Apply silver alginate to wound bed as instructed Secondary Dressing: Bordered Gauze, 4x4 in (Generic) Every Other Day/30 Days Discharge Instructions: Apply over primary dressing as directed. Wound #4 - Lower Leg Wound Laterality: Right, Lateral, Proximal Cleanser: Soap and Water Every Other Day/30 Days Discharge Instructions: May shower and wash wound with dial antibacterial soap and water prior to dressing change. Cleanser: Wound Cleanser Every Other  Day/30 Days Discharge Instructions: Cleanse the wound with wound cleanser prior to applying a clean dressing using gauze sponges, not tissue or cotton balls. Prim Dressing: KerraCel Ag Gelling Fiber Dressing, 4x5 in (silver alginate) (Generic) Every Other Day/30 Days ary Discharge Instructions: Apply silver alginate to wound bed as instructed Prim Dressing: Xeroform Gauze Dressing, 2x2 (in/in) HBD (Generic) Every Other Day/30 Days ary Secondary Dressing: Bordered Gauze, 4x4 in (Generic) Every Other Day/30 Days Discharge Instructions: Apply over primary dressing as directed. Wound #6 - Trochanter Wound Laterality: Left Cleanser: Soap and Water Every Other Day/30 Days Discharge Instructions: May shower and wash wound with dial antibacterial soap and water prior to dressing change. Cleanser: Wound Cleanser Every Other Day/30 Days Discharge Instructions: Cleanse the wound with wound cleanser prior to applying a clean dressing using gauze sponges, not tissue or cotton balls. Prim Dressing: Santyl Ointment Every Other Day/30 Days ary Discharge Instructions: Apply nickel thick amount to wound bed as instructed Secondary Dressing: Bordered Gauze, 4x4 in (Generic) Every Other Day/30 Days Discharge Instructions: Apply over primary dressing as directed. Electronic Signature(s) Signed: 12/01/2021 5:51:03 PM By: Dellie Catholic RN Signed: 12/02/2021 9:16:21 AM By: Fredirick Maudlin MD  FACS Entered By: Dellie Catholic on 12/01/2021 17:44:29 -------------------------------------------------------------------------------- Problem List Details Patient Name: Date of Service: [name] 12/01/2021 9:30 A M Medical Record Number: [id number] Patient Account Number: [id number] Date of Birth/Sex: Treating RN: [birth year] (86 y.o. [name] Primary Care Provider: [name] Other Clinician: Referring Provider: Treating Provider/Extender: Carmie End in Treatment: 38 Active Problems ICD-10 Encounter Code Description Active Date MDM Diagnosis L89.324 Pressure ulcer of left buttock, stage 4 11/24/2020 No Yes E11.9 Type 2 diabetes mellitus without complications 05/14/9448 No Yes L89.223 Pressure ulcer of left hip, stage 3 07/28/2021 No Yes L89.893 Pressure ulcer of other site, stage 3 07/28/2021 No Yes Inactive Problems ICD-10 Code Description Active Date Inactive Date L89.320 Pressure ulcer of left buttock, unstageable 10/13/2020 10/13/2020 L89.93 Pressure ulcer of unspecified site, stage 3 12/23/2020 12/23/2020 Resolved Problems Electronic Signature(s) Signed: 12/01/2021 10:12:53 AM By: Fredirick Maudlin MD FACS Entered By: Fredirick Maudlin on 12/01/2021 10:12:53 -------------------------------------------------------------------------------- Progress Note Details Patient Name: Date of Service: [name] 12/01/2021 9:30 A M Medical Record Number: [id number] Patient Account Number: [id number] Date of Birth/Sex: Treating RN: 10/13/1935 (86 y.o. [name] Primary Care Provider: [name] Other Clinician: Referring Provider: Treating Provider/Extender: Carmie End in Treatment: 2 Subjective Chief Complaint Information obtained from Patient LEFT buttocks pressure ulcer History of Present Illness (HPI) Admission 5/27 [name] is an 86 year old female with a past medical history  of type 2 diabetes on oral agents, hypertension and dementia that presents today to the clinic for a wound to her right buttocks. Her daughter-in-law is present today and helps provide the history. This issue has been going on for 2 to 3 weeks now. She states she developed an eschar about 1 week ago. She has been taking doxycycline for possible soft tissue infection to the area prescribed by her primary care provider. Patient currently denies any signs of infection. 6/3; patient admitted to the clinic last week. She has a large necrotic area on her left buttock. They use Santyl last week. On arrival this visit she had completely necrotic surface with open to subcutaneous tissue. The surface was completely nonviable. They have been using Santyl. She apparently had been on doxycycline which she is just finishing prescribed by her primary doctor for possible soft tissue infection  6/7; patient with a large necrotic wound over her left buttock. Aggressive debridement last week. Culture of this grew Proteus unfortunately would not of been covered well or at least predictably by the doxycycline that her primary doctor put her on. I have therefore put her on Augmentin suspension 3 times a day. We are using silver alginate as the primary dressing while we deal with the infection. Her daughter has a list of concerns including swallowing difficulties, concerns about aspiration. The patient has advanced dementia. If this is Alzheimer's disease it is at its preterminal stages. I went over that swallowing difficulties are part of what happens in the preterminal stages of Alzheimer's disease. Nevertheless we will family members seems to want to pursue a very aggressive course 6/21 large wound over her left buttock. She has completed the antibiotics I gave her [Augmentin]. We are using silver alginate while we are dealing with the infection and also to help with the drainage In general the wound looks better she has  healthy granulation over perhaps 70% of it. Superiorly there is still necrotic debris I did not attempt to debride this today The patient has advanced dementia. I do not know that she could tolerate a wound VAC. She also has double incontinence which would make that difficult. Nevertheless she has been cared for diligently by her family. She is apparently eating well and may be 2 to 3 hours on this area all day 7/5; 2-week follow-up. She continues with a nice improvement in overall condition of the wound bed. The undermining area has still some adherent surface slough. We have been using silver alginate because of the drainage and possibility of at least superficial wound infection/colonization. Although I said in previous notes I wondered whether she could tolerate the wound VAC they have done so well with this wound at home I do not want to necessarily rule her out for a wound VAC and I am going to direct the start of a trial of wound VAC therapy if this can be covered by insurance, if we have home health support to change it at all 7/19; we did not get very far with the wound VAC because my original ICD 10 said this was unstageable. As usual this represents a considerable frustration because we would not of ordered a wound VAC if the wound was still unstageable. This currently represents a stage IV staging. This is all the way down through muscle layer. There is a rim of tissue over the bone and no palpable exposed bone but this is a deep wound. Fortunately at present no evidence of infection. We have been using silver collagen with backing wet-to-dry. The wound is really no better today but no worse 8/2; patient presents for 2-week follow-up. She has been using the wound VAC for the past week. There has been some issues with keeping the drape in place however home health comes out to reapply the drape when this happens. Overall there are no issues or complaints today. No signs of infection. 8/17;  patient has been using the wound VAC on her wound on the upper left buttock. About 50 to 60% of this area is fully granulated however from about 10-5 o'clock there is still undermining. UNFORTUNATELY she also arrives in clinic today with a wound over her right fibular head. According to her daughter has been there for about 2 weeks. She wonders whether there is an abrasion although no concrete history of this. Given her frail status this is probably most  likely a pressure ulcer over a bony prominence [fibular head] 8/31 unfortunately the upper left buttock pressure ulcer stage IV is not as optimistically better as I was hoping. The granulation that I identified on 12/23/20 is no longer adherent at the inferior pole the undermining is quite extensive but there is no palpable bone. No evidence of surrounding infection. There may be some improvement in the undermining measurements We are still dealing with the additional wound we identified 2 weeks ago on the right fibular head as well we have been using silver alginate here 9/14; 1 month follow-up. She has now had the wound VAC on for probably 2 months. No major change here if she still has the tunneling area at 12:00 and undermining from about 6-12. Surface of the wound does not look healthy. We use MolecuLight to look at the wound surface which actually looked negative however she has extensive immunofluorescence in the wound edge from about 12-6 o'clock this actually extends beyond the visible wound margin by quite amount. 9/27; no major change in the left buttock wound. There is still undermining however no exposed bone. Granulation looks reasonable. oo Right fibular head is much smaller We are using Hydrofera Blue on the right fibular head silver alginate on the buttock area 10/12; left buttock wound is large with undermining. I think this is about the same as last time granulation looks reasonable there is no exposed bone. The area on the  fibular head is almost completely closed 11/30; left buttock wound large wound with undermining superiorly. As her daughter points out there is some change in the granulation superiorly and more darker red color also some raised areas of hypergranulation. I cannot tell that the area is infected. We are using silver alginate she is changing this daily On the right fibular head the area is just about closed 12/28; the patient's wound over her right fibular head is closed. Her left buttock ulcer looks actually a little better. Measurements are slightly improved. There is no exposed bone. The patient's daughter came in asking me about more aggressive options to attempt to get closure of the subcu wound including surgery. I thought we had mostly agreed that this would be a palliative type setting and that I did not expect this to actually close nevertheless I tried to answer her questions. I do not think the patient is a candidate for a myocutaneous flap. She is simply too frail and I doubt plastic surgery would consider her a viable candidate. She might be a candidate for an advanced treatment option like Oasis which sometimes can stimulate granulation to get these wounds to gradually close. I told her in order for this to make sense she would need probably a Foley catheter to control the incontinence and unfortunately weekly visits which would be difficult on this patient 1/25; 1 month follow-up. The patient has a large stage IV left buttock ulcer. Substantially deep but no exposed bone. She has advanced dementia. I do not think she is a candidate for anything aggressive and I follow her on a palliative basis. I been over this with her daughter many times. We have been using silver alginate backing ABDs. She has a level 3 surface on her mattress in spite of this she developed some degree of erythema today on the upper left pelvis 2/22; 1 month follow-up. Large left buttock pressure ulcer. We've been  using silver alginate as a palliative dressing. Essentially deep wound with no exposed bone. No evidence of surrounding infection.  This patient has advanced/preterminal dementia. Her daughter reports that he had a time where she was not eating very well although she seems to be improved lately. she does not appear to be systemically unwell 07/28/2021: 1 month follow-up. The overall wound dimensions are little bit smaller but she does have significant undermining and epibole. They have been using silver alginate in the wound. There is a small amount of slough buildup. The daughter-in-law that is with her today also expressed concern about some discoloration on the patient's left trochanter and right fibular head. 09/08/2021: 6-week follow-up. No significant change to the left buttock ulcer. There is a bit of slough buildup on the surface but it is otherwise clean without significant drainage or odor. The left trochanter looks like it may have partially broken down and then subsequently healed without intervention; there is some eschar overlying this and when I removed it, there was fresh pink tissue but no opening. On the right fibular head, this site has opened and there are 2 small wounds with a bit of slough accumulation. No odor or drainage. 10/20/2021: 6-week follow-up. No real change to the left buttock ulcer. The left trochanter is open now. It is fairly superficial but does extend into the fat layer. The right fibular head is clean and limited to the skin. She has a new area of tissue breakdown overlying her sacrum, just medial to her large left buttock ulcer. This looks like it is secondary to shear. 12/01/2021: 6-week follow-up. The left buttock ulcer remains unchanged. There is a light layer of slough and biofilm on the surface. The left trochanter has a heavy layer of eschar on it. The right fibular head wound is deeper today and has heavy slough accumulation. The area of tissue breakdown on her  sacrum that was seen at her last visit has healed. Patient History Unable to Obtain Patient History due to Dementia. Information obtained from Patient. Family History Stroke - Siblings, No family history of Cancer, Diabetes, Heart Disease, Hereditary Spherocytosis, Hypertension, Kidney Disease, Lung Disease, Seizures, Thyroid Problems, Tuberculosis. Social History Never smoker, Marital Status - Divorced, Alcohol Use - Never, Drug Use - No History, Caffeine Use - Never. Medical History Eyes Denies history of Cataracts, Glaucoma, Optic Neuritis Ear/Nose/Mouth/Throat Denies history of Chronic sinus problems/congestion, Middle ear problems Hematologic/Lymphatic Patient has history of Anemia Denies history of Hemophilia, Human Immunodeficiency Virus, Lymphedema, Sickle Cell Disease Respiratory Denies history of Aspiration, Asthma, Chronic Obstructive Pulmonary Disease (COPD), Pneumothorax, Sleep Apnea, Tuberculosis Cardiovascular Patient has history of Deep Vein Thrombosis, Hypertension Denies history of Angina, Arrhythmia, Congestive Heart Failure, Coronary Artery Disease, Hypotension, Myocardial Infarction, Peripheral Arterial Disease, Peripheral Venous Disease, Phlebitis, Vasculitis Gastrointestinal Denies history of Cirrhosis , Colitis, Crohnoos, Hepatitis A, Hepatitis B, Hepatitis C Endocrine Patient has history of Type II Diabetes Denies history of Type I Diabetes Genitourinary Denies history of End Stage Renal Disease Immunological Denies history of Lupus Erythematosus, Raynaudoos, Scleroderma Integumentary (Skin) Denies history of History of Burn Musculoskeletal Denies history of Gout, Rheumatoid Arthritis, Osteoarthritis, Osteomyelitis Neurologic Patient has history of Dementia Denies history of Neuropathy, Quadriplegia, Paraplegia, Seizure Disorder Oncologic Denies history of Received Chemotherapy, Received Radiation Psychiatric Denies history of Anorexia/bulimia,  Confinement Anxiety Hospitalization/Surgery History - cholecystectomy. - vaginal hysterectomy. - vascular surgery. Medical A Surgical History Notes nd Respiratory hx PE Cardiovascular atrial septal aneurysm, hyperlipidemia Genitourinary recent UTi Neurologic CVA Objective Constitutional Hypertensive, asymptomatic. No acute distress.. Vitals Time Taken: 9:38 AM, Height: 68 in, Weight: 110 lbs, BMI: 16.7, Temperature: 98.3 F,  Pulse: 84 bpm, Respiratory Rate: 16 breaths/min, Blood Pressure: 176/77 mmHg. General Notes: 12/01/2021: The left buttock ulcer remains unchanged. There is a light layer of slough and biofilm on the surface. The left trochanter has a heavy layer of eschar on it. The right fibular head wound is deeper today and has heavy slough accumulation. The area of tissue breakdown on her sacrum that was seen at her last visit has healed. Integumentary (Hair, Skin) Wound #2 status is Open. Original cause of wound was Pressure Injury. The date acquired was: 09/18/2020. The wound has been in treatment 60 weeks. The wound is located on the Left Gluteus. The wound measures 4.1cm length x 3cm width x 1.5cm depth; 9.66cm^2 area and 14.491cm^3 volume. There is undermining starting at :00 and ending at :00 with a maximum distance of 1cm. There is a medium amount of serosanguineous drainage noted. The wound margin is epibole. There is large (67-100%) red, pink granulation within the wound bed. There is a small (1-33%) amount of necrotic tissue within the wound bed including Adherent Slough. Wound #4 status is Open. Original cause of wound was Trauma. The date acquired was: 09/08/2021. The wound has been in treatment 12 weeks. The wound is located on the Right,Proximal,Lateral Lower Leg. The wound measures 0.8cm length x 0.7cm width x 0.1cm depth; 0.44cm^2 area and 0.044cm^3 volume. There is no tunneling or undermining noted. There is a medium amount of serosanguineous drainage noted. The  wound margin is distinct with the outline attached to the wound base. There is small (1-33%) pink granulation within the wound bed. There is a large (67-100%) amount of necrotic tissue within the wound bed including Adherent Slough. Wound #5 status is Healed - Epithelialized. Original cause of wound was Shear/Friction. The date acquired was: 09/17/2021. The wound has been in treatment 6 weeks. The wound is located on the Right Gluteus. The wound measures 0cm length x 0cm width x 0cm depth; 0cm^2 area and 0cm^3 volume. There is no tunneling or undermining noted. There is a none present amount of drainage noted. There is no granulation within the wound bed. There is no necrotic tissue within the wound bed. Wound #6 status is Open. Original cause of wound was Pressure Injury. The date acquired was: 09/17/2021. The wound has been in treatment 6 weeks. The wound is located on the Left Trochanter. The wound measures 0.7cm length x 0.6cm width x 0.1cm depth; 0.33cm^2 area and 0.033cm^3 volume. There is Fat Layer (Subcutaneous Tissue) exposed. There is no tunneling or undermining noted. There is a medium amount of serosanguineous drainage noted. The wound margin is flat and intact. There is no granulation within the wound bed. There is a large (67-100%) amount of necrotic tissue within the wound bed including Eschar. Assessment Active Problems ICD-10 Pressure ulcer of left buttock, stage 4 Type 2 diabetes mellitus without complications Pressure ulcer of left hip, stage 3 Pressure ulcer of other site, stage 3 Procedures Wound #2 Pre-procedure diagnosis of Wound #2 is a Pressure Ulcer located on the Left Gluteus . There was a Selective/Open Wound Non-Viable Tissue Debridement with a total area of 12.3 sq cm performed by Fredirick Maudlin, MD. With the following instrument(s): Curette to remove Non-Viable tissue/material. Material removed includes Slough and Biofilm and after achieving pain control using  Lidocaine 4% T opical Solution. No specimens were taken. A time out was conducted at 10:02, prior to the start of the procedure. A Minimum amount of bleeding was controlled with Pressure. The procedure was tolerated  well with a pain level of 0 throughout and a pain level of 0 following the procedure. Post Debridement Measurements: 4.1cm length x 3cm width x 1.5cm depth; 14.491cm^3 volume. Post debridement Stage noted as Category/Stage III. Character of Wound/Ulcer Post Debridement is improved. Post procedure Diagnosis Wound #2: Same as Pre-Procedure Wound #4 Pre-procedure diagnosis of Wound #4 is a Pressure Ulcer located on the Right,Proximal,Lateral Lower Leg . There was a Excisional Skin/Subcutaneous Tissue Debridement with a total area of 0.56 sq cm performed by Fredirick Maudlin, MD. With the following instrument(s): Curette to remove Non-Viable tissue/material. Material removed includes Subcutaneous Tissue and Slough and after achieving pain control using Lidocaine 4% T opical Solution. No specimens were taken. A time out was conducted at 10:02, prior to the start of the procedure. A Minimum amount of bleeding was controlled with Pressure. The procedure was tolerated well with a pain level of 0 throughout and a pain level of 0 following the procedure. Post Debridement Measurements: 0.8cm length x 0.7cm width x 0.1cm depth; 0.044cm^3 volume. Post debridement Stage noted as Category/Stage II. Character of Wound/Ulcer Post Debridement is improved. Post procedure Diagnosis Wound #4: Same as Pre-Procedure Wound #6 Pre-procedure diagnosis of Wound #6 is a Pressure Ulcer located on the Left Trochanter . There was a Excisional Skin/Subcutaneous Tissue Debridement with a total area of 0.42 sq cm performed by Fredirick Maudlin, MD. With the following instrument(s): Curette to remove Non-Viable tissue/material. Material removed includes Subcutaneous Tissue and Slough and after achieving pain control using  Lidocaine 4% T opical Solution. No specimens were taken. A time out was conducted at 10:02, prior to the start of the procedure. A Minimum amount of bleeding was controlled with Pressure. The procedure was tolerated well with a pain level of 0 throughout and a pain level of 0 following the procedure. Post Debridement Measurements: 0.7cm length x 0.6cm width x 0.1cm depth; 0.033cm^3 volume. Post debridement Stage noted as Unstageable/Unclassified. Character of Wound/Ulcer Post Debridement is improved. Post procedure Diagnosis Wound #6: Same as Pre-Procedure Plan The following medication(s) was prescribed: Santyl topical 250 unit/gram ointment Apply nickel thick layer to left trochanter wound with each dressing change starting 12/01/2021 12/01/2021: The left buttock ulcer remains unchanged. There is a light layer of slough and biofilm on the surface. The left trochanter has a heavy layer of eschar on it. The right fibular head wound is deeper today and has heavy slough accumulation. The area of tissue breakdown on her sacrum that was seen at her last visit has healed. I used a curette to debride slough and nonviable subcutaneous tissue from the right fibular head wound. I debrided slough and biofilm from the buttock ulcer. I attempted to debride the eschar from the left trochanter wound, but was unable to completely remove it secondary to patient discomfort. We will add Santyl to her dressing change regimen to try and further enzymatically debride this site. We will continue silver alginate to all of her wound sites. Follow-up in 6 weeks. Electronic Signature(s) Signed: 12/01/2021 10:26:11 AM By: Fredirick Maudlin MD FACS Entered By: Fredirick Maudlin on 12/01/2021 10:26:10 -------------------------------------------------------------------------------- HxROS Details Patient Name: Date of Service: [name] 12/01/2021 9:30 A M Medical Record Number: [id number] Patient Account Number:  [id number] Date of Birth/Sex: Treating RN: 05-13-1935 (86 y.o. [name] Primary Care Provider: [name] Other Clinician: Referring Provider: Treating Provider/Extender: Carmie End in Treatment: 94 Unable to Obtain Patient History due to Dementia Information Obtained From Patient Eyes Medical History: Negative  for: Cataracts; Glaucoma; Optic Neuritis Ear/Nose/Mouth/Throat Medical History: Negative for: Chronic sinus problems/congestion; Middle ear problems Hematologic/Lymphatic Medical History: Positive for: Anemia Negative for: Hemophilia; Human Immunodeficiency Virus; Lymphedema; Sickle Cell Disease Respiratory Medical History: Negative for: Aspiration; Asthma; Chronic Obstructive Pulmonary Disease (COPD); Pneumothorax; Sleep Apnea; Tuberculosis Past Medical History Notes: hx PE Cardiovascular Medical History: Positive for: Deep Vein Thrombosis; Hypertension Negative for: Angina; Arrhythmia; Congestive Heart Failure; Coronary Artery Disease; Hypotension; Myocardial Infarction; Peripheral Arterial Disease; Peripheral Venous Disease; Phlebitis; Vasculitis Past Medical History Notes: atrial septal aneurysm, hyperlipidemia Gastrointestinal Medical History: Negative for: Cirrhosis ; Colitis; Crohns; Hepatitis A; Hepatitis B; Hepatitis C Endocrine Medical History: Positive for: Type II Diabetes Negative for: Type I Diabetes Time with diabetes: 15 years Treated with: Oral agents Blood sugar tested every day: Yes Tested : 2 times per day Genitourinary Medical History: Negative for: End Stage Renal Disease Past Medical History Notes: recent UTi Immunological Medical History: Negative for: Lupus Erythematosus; Raynauds; Scleroderma Integumentary (Skin) Medical History: Negative for: History of Burn Musculoskeletal Medical History: Negative for: Gout; Rheumatoid Arthritis; Osteoarthritis; Osteomyelitis Neurologic Medical  History: Positive for: Dementia Negative for: Neuropathy; Quadriplegia; Paraplegia; Seizure Disorder Past Medical History Notes: CVA Oncologic Medical History: Negative for: Received Chemotherapy; Received Radiation Psychiatric Medical History: Negative for: Anorexia/bulimia; Confinement Anxiety Immunizations Pneumococcal Vaccine: Received Pneumococcal Vaccination: No Implantable Devices None Hospitalization / Surgery History Type of Hospitalization/Surgery cholecystectomy vaginal hysterectomy vascular surgery Family and Social History Cancer: No; Diabetes: No; Heart Disease: No; Hereditary Spherocytosis: No; Hypertension: No; Kidney Disease: No; Lung Disease: No; Seizures: No; Stroke: Yes - Siblings; Thyroid Problems: No; Tuberculosis: No; Never smoker; Marital Status - Divorced; Alcohol Use: Never; Drug Use: No History; Caffeine Use: Never; Financial Concerns: No; Food, Clothing or Shelter Needs: No; Support System Lacking: No; Transportation Concerns: No Electronic Signature(s) Signed: 12/01/2021 12:38:36 PM By: Fredirick Maudlin MD FACS Signed: 12/01/2021 5:51:03 PM By: Dellie Catholic RN Entered By: Fredirick Maudlin on 12/01/2021 10:14:29 -------------------------------------------------------------------------------- SuperBill Details Patient Name: Date of Service: [name] 12/01/2021 Medical Record Number: [id number] Patient Account Number: [id number] Date of Birth/Sex: Treating RN: 1936/02/26 (86 y.o. [name] Primary Care Provider: [name] Other Clinician: Referring Provider: Treating Provider/Extender: Carmie End in Treatment: 60 Diagnosis Coding ICD-10 Codes Code Description [id number] Pressure ulcer of left buttock, stage 4 E11.9 Type 2 diabetes mellitus without complications W73.710 Pressure ulcer of left hip, stage 3 L89.893 Pressure ulcer of other site, stage 3 Facility Procedures CPT4 Code:  [id number] Description: 62703 - DEB SUBQ TISSUE 20 SQ CM/< ICD-10 Diagnosis Description L89.893 Pressure ulcer of other site, stage 3 Modifier: Quantity: 1 CPT4 Code: [id number] Description: 29937 - DEBRIDE WOUND 1ST 20 SQ CM OR < ICD-10 Diagnosis Description L89.324 Pressure ulcer of left buttock, stage 4 L89.223 Pressure ulcer of left hip, stage 3 Modifier: Quantity: 1 Physician Procedures : CPT4 Code Description Modifier [id number] 38101 - WC PHYS LEVEL 4 - EST PT 25 ICD-10 Diagnosis Description L89.324 Pressure ulcer of left buttock, stage 4 L89.223 Pressure ulcer of left hip, stage 3 L89.893 Pressure ulcer of other site, stage 3 E11.9 Type  2 diabetes mellitus without complications Quantity: 1 : [id number] 11042 - WC PHYS SUBQ TISS 20 SQ CM ICD-10 Diagnosis Description L89.893 Pressure ulcer of other site, stage 3 Quantity: 1 : [id number] 97597 - WC PHYS DEBR WO ANESTH 20 SQ CM ICD-10 Diagnosis Description L89.324 Pressure ulcer of left buttock, stage 4 L89.223 Pressure ulcer of left hip, stage 3 Quantity: 1 Electronic Signature(s) Signed:  12/01/2021 10:35:27 AM By: Fredirick Maudlin MD FACS Entered By: Fredirick Maudlin on 12/01/2021 10:35:27

## 2021-12-03 DIAGNOSIS — L89892 Pressure ulcer of other site, stage 2: Secondary | ICD-10-CM | POA: Diagnosis not present

## 2021-12-03 DIAGNOSIS — L89222 Pressure ulcer of left hip, stage 2: Secondary | ICD-10-CM | POA: Diagnosis not present

## 2021-12-03 DIAGNOSIS — Z7984 Long term (current) use of oral hypoglycemic drugs: Secondary | ICD-10-CM | POA: Diagnosis not present

## 2021-12-03 DIAGNOSIS — E1140 Type 2 diabetes mellitus with diabetic neuropathy, unspecified: Secondary | ICD-10-CM | POA: Diagnosis not present

## 2021-12-03 DIAGNOSIS — L89320 Pressure ulcer of left buttock, unstageable: Secondary | ICD-10-CM | POA: Diagnosis not present

## 2021-12-03 DIAGNOSIS — F0390 Unspecified dementia without behavioral disturbance: Secondary | ICD-10-CM | POA: Diagnosis not present

## 2021-12-03 NOTE — Progress Notes (Signed)
This encounter was created in error - please disregard.

## 2021-12-07 ENCOUNTER — Encounter: Payer: Self-pay | Admitting: Nurse Practitioner

## 2021-12-07 ENCOUNTER — Ambulatory Visit (INDEPENDENT_AMBULATORY_CARE_PROVIDER_SITE_OTHER): Payer: Medicare Other | Admitting: Nurse Practitioner

## 2021-12-07 DIAGNOSIS — Z0000 Encounter for general adult medical examination without abnormal findings: Secondary | ICD-10-CM | POA: Diagnosis not present

## 2021-12-07 NOTE — Progress Notes (Signed)
   This service is provided via telemedicine  No vital signs collected/recorded due to the encounter was a telemedicine visit.   Location of patient (ex: home, work):  Home  Patient consents to a telephone visit: Yes, see telephone visit dated 12/07/21  Location of the provider (ex: office, home): [place], [place]   Name of any referring provider:  N/A  Names of all persons participating in the telemedicine service and their role in the encounter:  S.Chrae B/CMA, [name], NP, Pat (caregiver), and Patient   Time spent on call:  11 min with medical assistant

## 2021-12-07 NOTE — Patient Instructions (Addendum)
[name] , Thank you for taking time to come for your Medicare Wellness Visit. I appreciate your ongoing commitment to your health goals. Please review the following plan we discussed and let me know if I can assist you in the future.   Screening recommendations/referrals: Colonoscopy aged out Mammogram aged out Bone Density NA Recommended yearly ophthalmology/optometry visit for glaucoma screening and checkup Recommended yearly dental visit for hygiene and checkup  Vaccinations: Influenza vaccine due yearly in October Pneumococcal vaccine DUE- recommend to get at your local pharmacy or [facility] Tdap vaccine up to date Shingles vaccine DUE- recommend to get at your local pharmacy       Advanced directives: on file.   Conditions/risks identified: 86 aged, progressive memory loss. Skin breakdown  Next appointment: yearly for awv   Preventive Care 84 Years and Older, Female Preventive care refers to lifestyle choices and visits with your health care provider that can promote health and wellness. What does preventive care include? A yearly physical exam. This is also called an annual well check. Dental exams once or twice a year. Routine eye exams. Ask your health care provider how often you should have your eyes checked. Personal lifestyle choices, including: Daily care of your teeth and gums. Regular physical activity. Eating a healthy diet. Avoiding tobacco and drug use. Limiting alcohol use. Practicing safe sex. Taking low-dose aspirin every day. Taking vitamin and mineral supplements as recommended by your health care provider. What happens during an annual well check? The services and screenings done by your health care provider during your annual well check will depend on your age, overall health, lifestyle risk factors, and family history of disease. Counseling  Your health care provider may ask you questions about your: Alcohol use. Tobacco use. Drug use. Emotional  well-being. Home and relationship well-being. Sexual activity. Eating habits. History of falls. Memory and ability to understand (cognition). Work and work Statistician. Reproductive health. Screening  You may have the following tests or measurements: Height, weight, and BMI. Blood pressure. Lipid and cholesterol levels. These may be checked every 5 years, or more frequently if you are over 84 years old. Skin check. Lung cancer screening. You may have this screening every year starting at age 84 if you have a 30-pack-year history of smoking and currently smoke or have quit within the past 15 years. Fecal occult blood test (FOBT) of the stool. You may have this test every year starting at age 84. Flexible sigmoidoscopy or colonoscopy. You may have a sigmoidoscopy every 5 years or a colonoscopy every 10 years starting at age 84. Hepatitis C blood test. Hepatitis B blood test. Sexually transmitted disease (STD) testing. Diabetes screening. This is done by checking your blood sugar (glucose) after you have not eaten for a while (fasting). You may have this done every 1-3 years. Bone density scan. This is done to screen for osteoporosis. You may have this done starting at age 84. Mammogram. This may be done every 1-2 years. Talk to your health care provider about how often you should have regular mammograms. Talk with your health care provider about your test results, treatment options, and if necessary, the need for more tests. Vaccines  Your health care provider may recommend certain vaccines, such as: Influenza vaccine. This is recommended every year. Tetanus, diphtheria, and acellular pertussis (Tdap, Td) vaccine. You may need a Td booster every 10 years. Zoster vaccine. You may need this after age 84. Pneumococcal 13-valent conjugate (PCV13) vaccine. One dose is recommended after  age 84. Pneumococcal polysaccharide (PPSV23) vaccine. One dose is recommended after age 84. Talk to your  health care provider about which screenings and vaccines you need and how often you need them. This information is not intended to replace advice given to you by your health care provider. Make sure you discuss any questions you have with your health care provider. Document Released: 05/22/2015 Document Revised: 01/13/2016 Document Reviewed: 02/24/2015 Elsevier Interactive Patient Education  2017 [employer] Prevention in the Home Falls can cause injuries. They can happen to people of all ages. There are many things you can do to make your home safe and to help prevent falls. What can I do on the outside of my home? Regularly fix the edges of walkways and driveways and fix any cracks. Remove anything that might make you trip as you walk through a door, such as a raised step or threshold. Trim any bushes or trees on the path to your home. Use bright outdoor lighting. Clear any walking paths of anything that might make someone trip, such as rocks or tools. Regularly check to see if handrails are loose or broken. Make sure that both sides of any steps have handrails. Any raised decks and porches should have guardrails on the edges. Have any leaves, snow, or ice cleared regularly. Use sand or salt on walking paths during winter. Clean up any spills in your garage right away. This includes oil or grease spills. What can I do in the bathroom? Use night lights. Install grab bars by the toilet and in the tub and shower. Do not use towel bars as grab bars. Use non-skid mats or decals in the tub or shower. If you need to sit down in the shower, use a plastic, non-slip stool. Keep the floor dry. Clean up any water that spills on the floor as soon as it happens. Remove soap buildup in the tub or shower regularly. Attach bath mats securely with double-sided non-slip rug tape. Do not have throw rugs and other things on the floor that can make you trip. What can I do in the bedroom? Use night  lights. Make sure that you have a light by your bed that is easy to reach. Do not use any sheets or blankets that are too big for your bed. They should not hang down onto the floor. Have a firm chair that has side arms. You can use this for support while you get dressed. Do not have throw rugs and other things on the floor that can make you trip. What can I do in the kitchen? Clean up any spills right away. Avoid walking on wet floors. Keep items that you use a lot in easy-to-reach places. If you need to reach something above you, use a strong step stool that has a grab bar. Keep electrical cords out of the way. Do not use floor polish or wax that makes floors slippery. If you must use wax, use non-skid floor wax. Do not have throw rugs and other things on the floor that can make you trip. What can I do with my stairs? Do not leave any items on the stairs. Make sure that there are handrails on both sides of the stairs and use them. Fix handrails that are broken or loose. Make sure that handrails are as long as the stairways. Check any carpeting to make sure that it is firmly attached to the stairs. Fix any carpet that is loose or worn. Avoid having throw rugs  at the top or bottom of the stairs. If you do have throw rugs, attach them to the floor with carpet tape. Make sure that you have a light switch at the top of the stairs and the bottom of the stairs. If you do not have them, ask someone to add them for you. What else can I do to help prevent falls? Wear shoes that: Do not have high heels. Have rubber bottoms. Are comfortable and fit you well. Are closed at the toe. Do not wear sandals. If you use a stepladder: Make sure that it is fully opened. Do not climb a closed stepladder. Make sure that both sides of the stepladder are locked into place. Ask someone to hold it for you, if possible. Clearly mark and make sure that you can see: Any grab bars or handrails. First and last  steps. Where the edge of each step is. Use tools that help you move around (mobility aids) if they are needed. These include: Canes. Walkers. Scooters. Crutches. Turn on the lights when you go into a dark area. Replace any light bulbs as soon as they burn out. Set up your furniture so you have a clear path. Avoid moving your furniture around. If any of your floors are uneven, fix them. If there are any pets around you, be aware of where they are. Review your medicines with your doctor. Some medicines can make you feel dizzy. This can increase your chance of falling. Ask your doctor what other things that you can do to help prevent falls. This information is not intended to replace advice given to you by your health care provider. Make sure you discuss any questions you have with your health care provider. Document Released: 02/19/2009 Document Revised: 10/01/2015 Document Reviewed: 05/30/2014 Elsevier Interactive Patient Education  2017 [employer].

## 2021-12-07 NOTE — Progress Notes (Signed)
Subjective:   [name] is a 86 y.o. female who presents for Medicare Annual (Subsequent) preventive examination.  Review of Systems     Cardiac Risk Factors include: advanced age (>37mn, >>85women);hypertension;dyslipidemia;sedentary lifestyle;diabetes mellitus     Objective:    There were no vitals filed for this visit. There is no height or weight on file to calculate BMI.     12/07/2021    9:29 AM 11/30/2021    3:29 PM 02/24/2021    9:19 AM 01/04/2021   10:32 AM 11/10/2020    3:18 PM 09/24/2020    2:15 PM 02/26/2020   11:09 AM  Advanced Directives  Does Patient Have a Medical Advance Directive? Yes Yes Yes Yes Yes Yes Yes  Type of AParamedicof AWare PlaceOut of facility DNR (pink MOST or yellow form) HMcIntireLiving will;Out of facility DNR (pink MOST or yellow form) HStarruccaOut of facility DNR (pink MOST or yellow form) HOld BenningtonOut of facility DNR (pink MOST or yellow form) HGinger BlueOut of facility DNR (pink MOST or yellow form) HAlexandriaLiving will;Out of facility DNR (pink MOST or yellow form) HAnthonOut of facility DNR (pink MOST or yellow form)  Does patient want to make changes to medical advance directive? No - Patient declined No - Patient declined No - Patient declined No - Patient declined No - Patient declined No - Patient declined No - Patient declined  Copy of HLangley Parkin Chart? Yes - validated most recent copy scanned in chart (See row information) Yes - validated most recent copy scanned in chart (See row information) Yes - validated most recent copy scanned in chart (See row information) Yes - validated most recent copy scanned in chart (See row information) Yes - validated most recent copy scanned in chart (See row information) Yes - validated most recent copy scanned in chart (See row information) Yes -  validated most recent copy scanned in chart (See row information)  Pre-existing out of facility DNR order (yellow form or pink MOST form) Yellow form placed in chart (order not valid for inpatient use);Pink MOST form placed in chart (order not valid for inpatient use) Yellow form placed in chart (order not valid for inpatient use);Pink MOST form placed in chart (order not valid for inpatient use) Pink MOST form placed in chart (order not valid for inpatient use);Yellow form placed in chart (order not valid for inpatient use)  Yellow form placed in chart (order not valid for inpatient use);Pink MOST form placed in chart (order not valid for inpatient use)  Yellow form placed in chart (order not valid for inpatient use);Pink MOST form placed in chart (order not valid for inpatient use)    Current Medications (verified) Outpatient Encounter Medications as of 12/07/2021  Medication Sig   A&D OINT Apply 1 application topically as needed.    Accu-Chek Softclix Lancets lancets 1 each by Other route 2 (two) times daily. Use as instructed Dx: E11.9   acetaminophen (TYLENOL) 500 MG tablet Take 500 mg by mouth every 8 (eight) hours as needed (for general pain).    atorvastatin (LIPITOR) 40 MG tablet TAKE ONE TABLET BY MOUTH EVERY DAY AT 6 PM.   Blood Glucose Calibration (ACCU-CHEK GUIDE CONTROL) LIQD Use for blood sugar machine testing. Dx: E11.42   Blood Glucose Monitoring Suppl (ACCU-CHEK GUIDE) w/Device KIT Use to test blood sugar twice daily. Dx: E11.9   Cholecalciferol (  VITAMIN D-3) 125 MCG (5000 UT) TABS Take 5,000 Units by mouth daily.   cloNIDine (CATAPRES) 0.1 MG tablet Take 1 tablet (0.1 mg total) by mouth 2 (two) times daily. May take an additional tablet if needed SBP >170   gabapentin (NEURONTIN) 300 MG capsule Take 1 capsule (300 mg total) by mouth 2 (two) times daily.   glucose blood (ACCU-CHEK GUIDE) test strip Use to test blood sugar twice daily. Dx: E11.42   losartan (COZAAR) 25 MG tablet Take  one tablet by mouth once daily, take along with 60m to equal 744m  losartan (COZAAR) 50 MG tablet TAKE ONE TABLET BY MOUTH EVERY DAY ALONG WITH 25MG TO EQUAL 75MG   metFORMIN (GLUCOPHAGE) 500 MG tablet Take 1 tablet (500 mg total) by mouth 2 (two) times daily with a meal.   NON FORMULARY Take 1 capsule by mouth See admin instructions. Metagenics - UltraFlora Balance Daily Probiotic Immune Support* capsules- Take 1 capsule by mouth once a day   polyethylene glycol (MIRALAX / GLYCOLAX) packet Take 17 g by mouth daily as needed for mild constipation (MIX AND DRINK).    Rivaroxaban (XARELTO) 15 MG TABS tablet TAKE ONE TABLET BY MOUTH EVERY DAY WITH SUPPER   zinc gluconate 50 MG tablet Take 50 mg by mouth daily.   No facility-administered encounter medications on file as of 12/07/2021.    Allergies (verified) Patient has no known allergies.   History: Past Medical History:  Diagnosis Date   Dementia (HCWilberforce   Diabetes mellitus type II 03/21/2011   DVT (deep venous thrombosis) (HCHolly Pond11/04/2011   Hyperlipidemia 03/21/2011   Hypertension 03/21/2011   Lymphedema of leg 03/21/2011   Pulmonary embolism (HCCherryville11/04/2011   [facility]   Past Surgical History:  Procedure Laterality Date   CHOLECYSTECTOMY  2015   SKIN TAG REMOVAL  07/25/2016   VAGINAL HYSTERECTOMY     unknown of date   VASCULAR SURGERY     Family History  Problem Relation Age of Onset   Heart attack Mother    COPD Sister    Stroke Sister    Bipolar disorder Daughter    Social History   Socioeconomic History   Marital status: Widowed    Spouse name: Not on file   Number of children: Not on file   Years of education: Not on file   Highest education level: Not on file  Occupational History   Not on file  Tobacco Use   Smoking status: Never   Smokeless tobacco: Never  Vaping Use   Vaping Use: Never used  Substance and Sexual Activity   Alcohol use: No   Drug use: No   Sexual activity: Never  Other Topics  Concern   Not on file  Social History Narrative   Diet?       Do you drink/eat things with caffeine? occasional diet soda      Marital status?   Widowed/divorced                                 What year were you married? 19Scioou live in a house, apartment, assisted living, condo, trailer, etc.? home      Is it one or more stories? More (mainly living on one floor)      How many persons live in your home?      Do you have any  pets in your home? (please list) no      Current or past profession: teacher      Do you exercise?       Physical therapy                              Type & how often? 2-3 per week      Do you have a living will? yes      Do you have a DNR form?      ?                             If not, do you want to discuss one?      Do you have signed POA/HPOA for forms? Yes   11/29/16 Lives at home wih family, son, daughter in law, patrice and grand children.  Has 3 children.  She is widowed, retired.  Has BA- education level.     Social Determinants of Health   Financial Resource Strain: Low Risk  (10/18/2017)   Overall Financial Resource Strain (CARDIA)    Difficulty of Paying Living Expenses: Not hard at all  Food Insecurity: No Food Insecurity (10/18/2017)   Hunger Vital Sign    Worried About Running Out of Food in the Last Year: Never true    Ran Out of Food in the Last Year: Never true  Transportation Needs: No Transportation Needs (10/18/2017)   PRAPARE - Hydrologist (Medical): No    Lack of Transportation (Non-Medical): No  Physical Activity: Inactive (10/18/2017)   Exercise Vital Sign    Days of Exercise per Week: 0 days    Minutes of Exercise per Session: 0 min  Stress: No Stress Concern Present (10/18/2017)   [place]    Feeling of Stress : Not at all  Social Connections: Somewhat Isolated (10/18/2017)   Social Connection and Isolation Panel  [NHANES]    Frequency of Communication with Friends and Family: More than three times a week    Frequency of Social Gatherings with Friends and Family: More than three times a week    Attends Religious Services: More than 4 times per year    Active Member of [employer] or Organizations: No    Attends Archivist Meetings: Never    Marital Status: Widowed    Tobacco Counseling Counseling given: Not Answered   Clinical Intake:  Pre-visit preparation completed: Yes  Pain : No/denies pain     Nutritional Risks: Non-healing wound Diabetes: Yes  How often do you need to have someone help you when you read instructions, pamphlets, or other written materials from your doctor or pharmacy?: 5 - Always  Diabetic?yes         Activities of Daily Living    12/07/2021    1:12 PM  In your present state of health, do you have any difficulty performing the following activities:  Hearing? 0  Vision? 0  Difficulty concentrating or making decisions? 1  Walking or climbing stairs? 1  Dressing or bathing? 1  Doing errands, shopping? 1  Preparing Food and eating ? Y  Using the Toilet? Y  In the past six months, have you accidently leaked urine? Y  Do you have problems with loss of bowel control? Y  Managing your Medications? Y  Managing your Finances? Y  Housekeeping  or managing your Housekeeping? Y    Patient Care Team: [name], NP as PCP - General (Geriatric Medicine) [name], MD as Consulting Physician (Oncology) [name], [name], LCSW as Social Worker (Licensed Clinical Social Worker) [name], RN as Registered Nurse [facility] and Palliative Medicine)  Indicate any recent [place] you may have received from other than [facility] providers in the past year (date may be approximate).     Assessment:   This is a routine wellness examination for [employer].  Hearing/Vision screen Hearing Screening - Comments:: No hearing issues  Vision Screening -  Comments:: Last eye exam greater than 12 months ago, [facility]  Dietary issues and exercise activities discussed: Current Exercise Habits: The patient has a physically strenuous job, but has no regular exercise apart from work.;The patient does not participate in regular exercise at present   Goals Addressed   None    Depression Screen    12/07/2021    1:02 PM 11/10/2020    3:16 PM 11/05/2019    9:42 AM 10/24/2018   10:39 AM 10/24/2018   10:28 AM 05/04/2018    1:10 PM 10/18/2017   10:54 AM  PHQ 2/9 Scores  PHQ - 2 Score 0 0 0 0 0 0 0    Fall Risk    12/07/2021    1:02 PM 02/24/2021   12:58 PM 01/04/2021   10:31 AM 11/10/2020    3:17 PM 09/24/2020    2:15 PM  Fall Risk   Falls in the past year? 0 0 0 0 0  Number falls in past yr: 0 0 0 0 0  Injury with Fall? 0 0 0 0 0  Risk for fall due to : No Fall Risks No Fall Risks No Fall Risks No Fall Risks   Follow up Falls evaluation completed Falls evaluation completed Falls evaluation completed Falls evaluation completed     [place]:  Any stairs in or around the home? No  If so, are there any without handrails?  na Home free of loose throw rugs in walkways, pet beds, electrical cords, etc? Yes  Adequate lighting in your home to reduce risk of falls? Yes   ASSISTIVE DEVICES UTILIZED TO PREVENT FALLS:  Life alert? No  Use of a cane, walker or w/c? Yes  Grab bars in the bathroom? No  Shower chair or bench in shower? No  Elevated toilet seat or a handicapped toilet? Na- incontinent of bowel and bladder  TIMED UP AND GO:  Was the test performed? No .    Cognitive Function:    10/18/2017   11:14 AM 07/26/2016    2:49 PM  MMSE - Mini Mental State Exam  Not completed: Unable to complete Unable to complete        Immunizations Immunization History  Administered Date(s) Administered   PFIZER Comirnaty(Gray Top)Covid-19 Tri-Sucrose Vaccine 06/30/2020   PFIZER(Purple Top)SARS-COV-2  Vaccination 11/07/2019, 12/04/2019    TDAP status: Up to date  Flu Vaccine status: Up to date  Pneumococcal vaccine status: Due, Education has been provided regarding the importance of this vaccine. Advised may receive this vaccine at local pharmacy or Health Dept. Aware to provide a copy of the vaccination record if obtained from local pharmacy or Health Dept. Verbalized acceptance and understanding.  Covid-19 vaccine status: Information provided on how to obtain vaccines.   Qualifies for Shingles Vaccine? Yes   Zostavax completed No   Shingrix Completed?: No.  Education has been provided regarding the importance of this vaccine. Patient has been advised to call insurance company to determine out of pocket expense if they have not yet received this vaccine. Advised may also receive vaccine at local pharmacy or Health Dept. Verbalized acceptance and understanding.  Screening Tests Health Maintenance  Topic Date Due   OPHTHALMOLOGY EXAM  01/24/2018   FOOT EXAM  02/25/2021   HEMOGLOBIN A1C  07/06/2021   COVID-19 Vaccine (4 - Pfizer series) 12/23/2021 (Originally 08/25/2020)   TETANUS/TDAP  01/04/2022 (Originally 08/07/1954)   Pneumonia Vaccine 14+ Years old (1 - PCV) 12/08/2022 (Originally 08/08/2002)   Zoster Vaccines- Shingrix (1 of 2) 12/08/2022 (Originally 08/08/1987)   DEXA SCAN  Completed   HPV VACCINES  Aged Out   INFLUENZA VACCINE  Discontinued    Health Maintenance  Health Maintenance Due  Topic Date Due   OPHTHALMOLOGY EXAM  01/24/2018   FOOT EXAM  02/25/2021   HEMOGLOBIN A1C  07/06/2021    Colorectal cancer screening: No longer required.   Mammogram status: No longer required due to age.  Does not qualify for bone density   Lung Cancer Screening: (Low Dose CT Chest recommended if Age 13-80 years, 30 pack-year currently smoking OR have quit w/in 15years.) does not qualify.   Lung Cancer Screening Referral: na  Additional Screening:  Hepatitis C Screening: does  not qualify; Completed na  Vision Screening: Recommended annual ophthalmology exams for early detection of glaucoma and other disorders of the eye. Is the patient up to date with their annual eye exam?  No  Who is the provider or what is the name of the office in which the patient attends annual eye exams?omen If pt is not established with a provider, would they like to be referred to a provider to establish care? No .   Dental Screening: Recommended annual dental exams for proper oral hygiene  Community Resource Referral / Chronic Care Management: CRR required this visit?  No   CCM required this visit?  No      Plan:     I have personally reviewed and noted the following in the patient's chart:   Medical and social history Use of alcohol, tobacco or illicit drugs  Current medications and supplements including opioid prescriptions.  Functional ability and status Nutritional status Physical activity Advanced directives List of other physicians Hospitalizations, surgeries, and ER visits in previous 12 months Vitals Screenings to include cognitive, depression, and falls Referrals and appointments  In addition, I have reviewed and discussed with patient certain preventive protocols, quality metrics, and best practice recommendations. A written personalized care plan for preventive services as well as general preventive health recommendations were provided to patient.     [name], NP   12/07/2021     Virtual Visit via Telephone Note  I connected with patient 12/07/21 at  1:00 PM EDT by telephone and verified that I am speaking with the correct person using two identifiers.  Location: Patient: home Provider: [place]   I discussed the limitations, risks, security and privacy concerns of performing an evaluation and management service by telephone and the availability of in person appointments. I also discussed with the patient that there may be a patient  responsible charge related to this service. The patient expressed understanding and agreed to proceed.   I discussed the assessment and treatment plan with the patient. The patient was provided an opportunity to ask questions and all were answered. The patient agreed with the plan  and demonstrated an understanding of the instructions.   The patient was advised to call back or seek an in-person evaluation if the symptoms worsen or if the condition fails to improve as anticipated.  I provided 15 minutes of non-face-to-face time during this encounter.  Carlos American. Harle Battiest Avs printed and mailed

## 2021-12-09 ENCOUNTER — Telehealth: Payer: Self-pay

## 2021-12-09 DIAGNOSIS — L89222 Pressure ulcer of left hip, stage 2: Secondary | ICD-10-CM | POA: Diagnosis not present

## 2021-12-09 DIAGNOSIS — F0390 Unspecified dementia without behavioral disturbance: Secondary | ICD-10-CM | POA: Diagnosis not present

## 2021-12-09 DIAGNOSIS — E1140 Type 2 diabetes mellitus with diabetic neuropathy, unspecified: Secondary | ICD-10-CM | POA: Diagnosis not present

## 2021-12-09 DIAGNOSIS — L89320 Pressure ulcer of left buttock, unstageable: Secondary | ICD-10-CM | POA: Diagnosis not present

## 2021-12-09 DIAGNOSIS — L89892 Pressure ulcer of other site, stage 2: Secondary | ICD-10-CM | POA: Diagnosis not present

## 2021-12-09 DIAGNOSIS — Z7984 Long term (current) use of oral hypoglycemic drugs: Secondary | ICD-10-CM | POA: Diagnosis not present

## 2021-12-09 NOTE — Telephone Encounter (Signed)
Late Entry   Ms. Grattan,you are scheduled for a virtual visit with your provider today.    Just as we do with appointments in the office, we must obtain your consent to participate.  Your consent will be active for this visit and any virtual visit you may have with one of our providers in the next 365 days.    If you have a MyChart account, I can also send a copy of this consent to you electronically.  All virtual visits are billed to your insurance company just like a traditional visit in the office.  As this is a virtual visit, video technology does not allow for your provider to perform a traditional examination.  This may limit your provider's ability to fully assess your condition.  If your provider identifies any concerns that need to be evaluated in person or the need to arrange testing such as labs, EKG, etc, we will make arrangements to do so.    Although advances in technology are sophisticated, we cannot ensure that it will always work on either your end or our end.  If the connection with a video visit is poor, we may have to switch to a telephone visit.  With either a video or telephone visit, we are not always able to ensure that we have a secure connection.   I need to obtain your verbal consent now.   Are you willing to proceed with your visit today?   Lamiah Marmol, relative has provided verbal consent on 12/07/21 for a virtual visit (video or telephone).   Leigh [place], [place] 12/07/21 1:00 pm

## 2021-12-14 ENCOUNTER — Telehealth: Payer: Self-pay

## 2021-12-14 DIAGNOSIS — L89892 Pressure ulcer of other site, stage 2: Secondary | ICD-10-CM | POA: Diagnosis not present

## 2021-12-14 DIAGNOSIS — Z7984 Long term (current) use of oral hypoglycemic drugs: Secondary | ICD-10-CM | POA: Diagnosis not present

## 2021-12-14 DIAGNOSIS — L89320 Pressure ulcer of left buttock, unstageable: Secondary | ICD-10-CM | POA: Diagnosis not present

## 2021-12-14 DIAGNOSIS — E1140 Type 2 diabetes mellitus with diabetic neuropathy, unspecified: Secondary | ICD-10-CM | POA: Diagnosis not present

## 2021-12-14 DIAGNOSIS — L89222 Pressure ulcer of left hip, stage 2: Secondary | ICD-10-CM | POA: Diagnosis not present

## 2021-12-14 DIAGNOSIS — F0390 Unspecified dementia without behavioral disturbance: Secondary | ICD-10-CM | POA: Diagnosis not present

## 2021-12-14 NOTE — Telephone Encounter (Signed)
Yes okay with monitoring this, if it stays elevated can take additional tablet of clonidine.

## 2021-12-14 NOTE — Telephone Encounter (Signed)
Spoke with Kathlee Nations and she was informed of response from [name], NP, Also called daughter and informed her.

## 2021-12-14 NOTE — Telephone Encounter (Signed)
[name] nurse with [facility] states that patient's Blood pressure was 190/98 she had not taken clonidine this morning. After taken it rechecked bp is was then 204/98. [name] does not want to take her to emergency room she just wants to keep checking bp and lett nurse from [facility] know what it is. Please advise.  Message routed to [name], NP

## 2021-12-16 ENCOUNTER — Other Ambulatory Visit: Payer: Self-pay | Admitting: Nurse Practitioner

## 2021-12-16 DIAGNOSIS — E1142 Type 2 diabetes mellitus with diabetic polyneuropathy: Secondary | ICD-10-CM

## 2021-12-16 DIAGNOSIS — E1169 Type 2 diabetes mellitus with other specified complication: Secondary | ICD-10-CM

## 2021-12-16 DIAGNOSIS — I10 Essential (primary) hypertension: Secondary | ICD-10-CM

## 2021-12-22 DIAGNOSIS — F0390 Unspecified dementia without behavioral disturbance: Secondary | ICD-10-CM | POA: Diagnosis not present

## 2021-12-22 DIAGNOSIS — E1140 Type 2 diabetes mellitus with diabetic neuropathy, unspecified: Secondary | ICD-10-CM | POA: Diagnosis not present

## 2021-12-22 DIAGNOSIS — Z7984 Long term (current) use of oral hypoglycemic drugs: Secondary | ICD-10-CM | POA: Diagnosis not present

## 2021-12-22 DIAGNOSIS — L89222 Pressure ulcer of left hip, stage 2: Secondary | ICD-10-CM | POA: Diagnosis not present

## 2021-12-22 DIAGNOSIS — L89892 Pressure ulcer of other site, stage 2: Secondary | ICD-10-CM | POA: Diagnosis not present

## 2021-12-22 DIAGNOSIS — L89320 Pressure ulcer of left buttock, unstageable: Secondary | ICD-10-CM | POA: Diagnosis not present

## 2021-12-23 DIAGNOSIS — Z7984 Long term (current) use of oral hypoglycemic drugs: Secondary | ICD-10-CM | POA: Diagnosis not present

## 2021-12-23 DIAGNOSIS — E1140 Type 2 diabetes mellitus with diabetic neuropathy, unspecified: Secondary | ICD-10-CM | POA: Diagnosis not present

## 2021-12-23 DIAGNOSIS — I890 Lymphedema, not elsewhere classified: Secondary | ICD-10-CM | POA: Diagnosis not present

## 2021-12-23 DIAGNOSIS — L89320 Pressure ulcer of left buttock, unstageable: Secondary | ICD-10-CM | POA: Diagnosis not present

## 2021-12-23 DIAGNOSIS — F0390 Unspecified dementia without behavioral disturbance: Secondary | ICD-10-CM | POA: Diagnosis not present

## 2021-12-23 DIAGNOSIS — Z741 Need for assistance with personal care: Secondary | ICD-10-CM | POA: Diagnosis not present

## 2021-12-23 DIAGNOSIS — E785 Hyperlipidemia, unspecified: Secondary | ICD-10-CM | POA: Diagnosis not present

## 2021-12-23 DIAGNOSIS — Z8673 Personal history of transient ischemic attack (TIA), and cerebral infarction without residual deficits: Secondary | ICD-10-CM | POA: Diagnosis not present

## 2021-12-23 DIAGNOSIS — L89892 Pressure ulcer of other site, stage 2: Secondary | ICD-10-CM | POA: Diagnosis not present

## 2021-12-23 DIAGNOSIS — M6281 Muscle weakness (generalized): Secondary | ICD-10-CM | POA: Diagnosis not present

## 2021-12-23 DIAGNOSIS — Z86711 Personal history of pulmonary embolism: Secondary | ICD-10-CM | POA: Diagnosis not present

## 2021-12-23 DIAGNOSIS — L89222 Pressure ulcer of left hip, stage 2: Secondary | ICD-10-CM | POA: Diagnosis not present

## 2021-12-23 DIAGNOSIS — K5900 Constipation, unspecified: Secondary | ICD-10-CM | POA: Diagnosis not present

## 2021-12-23 DIAGNOSIS — Z7901 Long term (current) use of anticoagulants: Secondary | ICD-10-CM | POA: Diagnosis not present

## 2021-12-23 DIAGNOSIS — Z86718 Personal history of other venous thrombosis and embolism: Secondary | ICD-10-CM | POA: Diagnosis not present

## 2021-12-23 DIAGNOSIS — I10 Essential (primary) hypertension: Secondary | ICD-10-CM | POA: Diagnosis not present

## 2021-12-24 ENCOUNTER — Encounter (HOSPITAL_BASED_OUTPATIENT_CLINIC_OR_DEPARTMENT_OTHER): Payer: Medicare Other | Attending: General Surgery | Admitting: General Surgery

## 2021-12-24 DIAGNOSIS — G309 Alzheimer's disease, unspecified: Secondary | ICD-10-CM | POA: Insufficient documentation

## 2021-12-24 DIAGNOSIS — F02C0 Dementia in other diseases classified elsewhere, severe, without behavioral disturbance, psychotic disturbance, mood disturbance, and anxiety: Secondary | ICD-10-CM | POA: Diagnosis not present

## 2021-12-24 DIAGNOSIS — L89893 Pressure ulcer of other site, stage 3: Secondary | ICD-10-CM | POA: Diagnosis not present

## 2021-12-24 DIAGNOSIS — I10 Essential (primary) hypertension: Secondary | ICD-10-CM | POA: Diagnosis not present

## 2021-12-24 DIAGNOSIS — L89323 Pressure ulcer of left buttock, stage 3: Secondary | ICD-10-CM | POA: Diagnosis not present

## 2021-12-24 DIAGNOSIS — R1310 Dysphagia, unspecified: Secondary | ICD-10-CM | POA: Insufficient documentation

## 2021-12-24 DIAGNOSIS — E11622 Type 2 diabetes mellitus with other skin ulcer: Secondary | ICD-10-CM | POA: Insufficient documentation

## 2021-12-24 DIAGNOSIS — L89324 Pressure ulcer of left buttock, stage 4: Secondary | ICD-10-CM | POA: Diagnosis not present

## 2021-12-24 DIAGNOSIS — L89220 Pressure ulcer of left hip, unstageable: Secondary | ICD-10-CM | POA: Diagnosis not present

## 2021-12-24 DIAGNOSIS — L89223 Pressure ulcer of left hip, stage 3: Secondary | ICD-10-CM | POA: Diagnosis not present

## 2021-12-24 DIAGNOSIS — E785 Hyperlipidemia, unspecified: Secondary | ICD-10-CM | POA: Insufficient documentation

## 2021-12-24 DIAGNOSIS — L89892 Pressure ulcer of other site, stage 2: Secondary | ICD-10-CM | POA: Diagnosis not present

## 2021-12-24 NOTE — Progress Notes (Signed)
[name] ([id number]) , Visit Report for 12/24/2021 Arrival Information Details Patient Name: Date of Service: [name], AZPEITIA 12/24/2021 2:30 PM Medical Record Number: [id number] Patient Account Number: [id number] Date of Birth/Sex: Treating RN: [birth year] (86 y.o. [name] Primary Care Jaishawn Witzke: [name] Other Clinician: Referring Ammi Hutt: Treating Lajoyce Tamura/Extender: Carmie End in Treatment: 66 Visit Information History Since Last Visit Added or deleted any medications: No Patient Arrived: Wheel Chair Any new allergies or adverse reactions: No Arrival Time: 14:15 Had a fall or experienced change in No Accompanied By: daughter in law activities of daily living that may affect Transfer Assistance: Manual risk of falls: Patient Identification Verified: Yes Signs or symptoms of abuse/neglect since last visito No Patient Requires Transmission-Based Precautions: No Hospitalized since last visit: No Patient Has Alerts: No Implantable device outside of the clinic excluding No cellular tissue based products placed in the center since last visit: Has Dressing in Place as Prescribed: Yes Pain Present Now: No Electronic Signature(s) Signed: 12/24/2021 5:17:06 PM By: Dellie Catholic RN Entered By: Dellie Catholic on 12/24/2021 14:22:53 -------------------------------------------------------------------------------- Encounter Discharge Information Details Patient Name: Date of Service: [name] 12/24/2021 2:30 PM Medical Record Number: [id number] Patient Account Number: [id number] Date of Birth/Sex: Treating RN: 12-14-1935 (86 y.o. [name] Primary Care Kaylani Fromme: [name] Other Clinician: Referring Marlyn Rabine: Treating Tonea Leiphart/Extender: Carmie End in Treatment: 43 Encounter Discharge Information Items Post Procedure Vitals Discharge Condition: Stable Temperature (F): 97.5 Ambulatory  Status: Wheelchair Pulse (bpm): 80 Discharge Destination: Home Respiratory Rate (breaths/min): 14 Transportation: Private Auto Blood Pressure (mmHg): 171/85 Accompanied By: daughter and granddaughter Schedule Follow-up Appointment: Yes Clinical Summary of Care: Patient Declined Electronic Signature(s) Signed: 12/24/2021 5:17:06 PM By: Dellie Catholic RN Entered By: Dellie Catholic on 12/24/2021 16:21:55 -------------------------------------------------------------------------------- Lower Extremity Assessment Details Patient Name: Date of Service: [name], [name] 12/24/2021 2:30 PM Medical Record Number: [id number] Patient Account Number: [id number] Date of Birth/Sex: Treating RN: 31-Aug-1935 (86 y.o. [name] Primary Care Sarika Baldini: [name] Other Clinician: Referring Mattisyn Cardona: Treating Falan Hensler/Extender: Carmie End in Treatment: 63 Electronic Signature(s) Signed: 12/24/2021 5:17:06 PM By: Dellie Catholic RN Entered By: Dellie Catholic on 12/24/2021 14:23:32 -------------------------------------------------------------------------------- Multi Wound Chart Details Patient Name: Date of Service: [name] 12/24/2021 2:30 PM Medical Record Number: [id number] Patient Account Number: [id number] Date of Birth/Sex: Treating RN: [birth year] (86 y.o. F) Primary Care Jaymie Misch: [name] Other Clinician: Referring Duwane Gewirtz: Treating Brittannie Tawney/Extender: Carmie End in Treatment: 66 Vital Signs Height(in): 43 Pulse(bpm): 54 Weight(lbs): 110 Blood Pressure(mmHg): 171/83 Body Mass Index(BMI): 16.7 Temperature(F): 97.5 Respiratory Rate(breaths/min): 14 Photos: Left Gluteus Right, Proximal, Lateral Lower Leg Left Trochanter Wound Location: Pressure Injury Trauma Pressure Injury Wounding Event: Pressure Ulcer Pressure Ulcer Pressure Ulcer Primary Etiology: Anemia, Deep Vein Thrombosis, Anemia, Deep  Vein Thrombosis, Anemia, Deep Vein Thrombosis, Comorbid History: Hypertension, Type II Diabetes, Hypertension, Type II Diabetes, Hypertension, Type II Diabetes, Dementia Dementia Dementia 09/18/2020 09/08/2021 09/17/2021 Date Acquired: 64 15 9 Weeks of Treatment: Open Open Open Wound Status: No No No Wound Recurrence: 4.1x2x1.4 0.6x0.3x0.1 2x2.1x0.1 Measurements L x W x D (cm) 6.44 0.141 3.299 A (cm) : rea 9.016 0.014 0.33 Volume (cm) : 84.50% -19.50% -10541.90% % Reduction in A rea: -116.40% -16.70% -10900.00% % Reduction in Volume: 12 Starting Position 1 (o'clock): 12 Ending Position 1 (o'clock): 1.4 Maximum Distance 1 (cm): Yes No No Undermining: Category/Stage III Category/Stage II Unstageable/Unclassified Classification: Medium Medium Medium Exudate A mount: Serosanguineous Serosanguineous Serosanguineous Exudate  Type: red, brown red, brown red, brown Exudate Color: Epibole Distinct, outline attached Flat and Intact Wound Margin: Medium (34-66%) Small (1-33%) Small (1-33%) Granulation Amount: Red, Pink Pink Pink Granulation Quality: Medium (34-66%) Large (67-100%) Large (67-100%) Necrotic Amount: Adherent [place] Necrotic Tissue: Fat Layer (Subcutaneous Tissue): Yes Fascia: No Fascia: No Exposed Structures: Fascia: No Fat Layer (Subcutaneous Tissue): No Fat Layer (Subcutaneous Tissue): No Tendon: No Tendon: No Tendon: No Muscle: No Muscle: No Muscle: No Joint: No Joint: No Joint: No Bone: No Bone: No Bone: No Small (1-33%) Small (1-33%) Small (1-33%) Epithelialization: Debridement - Selective/Open Wound Debridement - Excisional Debridement - Excisional Debridement: Pre-procedure Verification/Time Out 14:55 14:55 14:55 Taken: Lidocaine 4% Topical Solution Lidocaine 4% Topical Solution Lidocaine 4% Topical Solution Pain Control: Psychologist, prison and probation services, Subcutaneous, Subcutaneous, Slough Tissue  Debrided: Slough Non-Viable Tissue Skin/Subcutaneous Tissue Skin/Subcutaneous Tissue Level: 8.2 0.18 4.2 Debridement A (sq cm): rea Curette Curette Curette Instrument: Minimum Minimum Minimum Bleeding: Pressure Pressure Pressure Hemostasis Achieved: 0 0 0 Procedural Pain: 0 0 0 Post Procedural Pain: Procedure was tolerated well Procedure was tolerated well Procedure was tolerated well Debridement Treatment Response: 4.1x2x1.4 0.6x0.3x0.1 2x2.1x0.1 Post Debridement Measurements L x W x D (cm) 9.016 0.014 0.33 Post Debridement Volume: (cm) Category/Stage III Category/Stage II Unstageable/Unclassified Post Debridement Stage: Debridement Debridement Debridement Procedures Performed: Treatment Notes Electronic Signature(s) Signed: 12/24/2021 3:07:26 PM By: Fredirick Maudlin MD FACS Entered By: Fredirick Maudlin on 12/24/2021 15:07:26 -------------------------------------------------------------------------------- Multi-Disciplinary Care Plan Details Patient Name: Date of Service: [name] 12/24/2021 2:30 PM Medical Record Number: [id number] Patient Account Number: [id number] Date of Birth/Sex: Treating RN: 1935-10-03 (86 y.o. [name] Primary Care Kikuye Korenek: [name] Other Clinician: Referring Flordia Kassem: Treating Kahron Kauth/Extender: Carmie End in Treatment: 12 Active Inactive Wound/Skin Impairment Nursing Diagnoses: Impaired tissue integrity Goals: Patient/caregiver will verbalize understanding of skin care regimen Date Initiated: 10/02/2020 Target Resolution Date: 04/07/2022 Goal Status: Active Ulcer/skin breakdown will have a volume reduction of 30% by week 4 Date Initiated: 10/02/2020 Date Inactivated: 01/06/2021 Target Resolution Date: 01/09/2021 Goal Status: Met Interventions: Assess patient/caregiver ability to obtain necessary supplies Assess patient/caregiver ability to perform ulcer/skin care regimen upon  admission and as needed Assess ulceration(s) every visit Provide education on ulcer and skin care Treatment Activities: Skin care regimen initiated : 10/02/2020 Topical wound management initiated : 10/02/2020 Notes: 11/10/20 : Goal not yet met, eschar debrided off increasing wound size. Electronic Signature(s) Signed: 12/24/2021 5:17:06 PM By: Dellie Catholic RN Entered By: Dellie Catholic on 12/24/2021 16:18:51 -------------------------------------------------------------------------------- Pain Assessment Details Patient Name: Date of Service: ETHELDA, DEANGELO 12/24/2021 2:30 PM Medical Record Number: [id number] Patient Account Number: [id number] Date of Birth/Sex: Treating RN: 04-09-1936 (86 y.o. [name] Primary Care Zakry Caso: [name] Other Clinician: Referring Heber Hoog: Treating Ortha Metts/Extender: Carmie End in Treatment: 50 Active Problems Location of Pain Severity and Description of Pain Patient Has Paino No Site Locations Pain Management and Medication Current Pain Management: Electronic Signature(s) Signed: 12/24/2021 5:17:06 PM By: Dellie Catholic RN Entered By: Dellie Catholic on 12/24/2021 14:23:24 -------------------------------------------------------------------------------- Patient/Caregiver Education Details Patient Name: Date of Service: [name] 8/18/2023andnbsp2:30 PM Medical Record Number: [id number] Patient Account Number: [id number] Date of Birth/Gender: Treating RN: 08/20/1935 (86 y.o. [name] Primary Care Physician: [name] Other Clinician: Referring Physician: Treating Physician/Extender: Carmie End in Treatment: 20 Education Assessment Education Provided To: Patient Education Topics Provided Wound/Skin Impairment: Methods: Explain/Verbal Responses: Return demonstration correctly Electronic Signature(s) Signed: 12/24/2021  5:17:06 PM By: Dellie Catholic RN Entered By: Dellie Catholic on 12/24/2021 16:19:24 -------------------------------------------------------------------------------- Wound Assessment Details Patient Name: Date of Service: TAFFIE, ECKMANN 12/24/2021 2:30 PM Medical Record Number: [id number] Patient Account Number: [id number] Date of Birth/Sex: Treating RN: [birth year] (86 y.o. [name] Primary Care March Joos: [name] Other Clinician: Referring Allisha Harter: Treating Tyreque Finken/Extender: Carmie End in Treatment: 64 Wound Status Wound Number: 2 Primary Pressure Ulcer Etiology: Wound Location: Left Gluteus Wound Status: Open Wounding Event: Pressure Injury Comorbid Anemia, Deep Vein Thrombosis, Hypertension, Type II Date Acquired: 09/18/2020 History: Diabetes, Dementia Weeks Of Treatment: 64 Clustered Wound: No Photos Wound Measurements Length: (cm) 4.1 Width: (cm) 2 Depth: (cm) 1.4 Area: (cm) 6.44 Volume: (cm) 9.016 % Reduction in Area: 84.5% % Reduction in Volume: -116.4% Epithelialization: Small (1-33%) Tunneling: No Undermining: Yes Starting Position (o'clock): 12 Ending Position (o'clock): 12 Maximum Distance: (cm) 1.4 Wound Description Classification: Category/Stage III Wound Margin: Epibole Exudate Amount: Medium Exudate Type: Serosanguineous Exudate Color: red, brown Foul Odor After Cleansing: No Slough/Fibrino Yes Wound Bed Granulation Amount: Medium (34-66%) Exposed Structure Granulation Quality: Red, Pink Fascia Exposed: No Necrotic Amount: Medium (34-66%) Fat Layer (Subcutaneous Tissue) Exposed: Yes Necrotic Quality: Adherent Slough Tendon Exposed: No Muscle Exposed: No Joint Exposed: No Bone Exposed: No Treatment Notes Wound #2 (Gluteus) Wound Laterality: Left Cleanser Soap and Water Discharge Instruction: May shower and wash wound with dial antibacterial soap and water prior to dressing change. Wound Cleanser Discharge  Instruction: Cleanse the wound with wound cleanser prior to applying a clean dressing using gauze sponges, not tissue or cotton balls. Peri-Wound Care Topical Primary Dressing KerraCel Ag Gelling Fiber Dressing, 4x5 in (silver alginate) Discharge Instruction: Apply silver alginate to wound bed as instructed Secondary Dressing Bordered Gauze, 4x4 in Discharge Instruction: Apply over primary dressing as directed. Secured With Compression Wrap Compression Stockings Environmental education officer) Signed: 12/24/2021 5:17:06 PM By: Dellie Catholic RN Entered By: Dellie Catholic on 12/24/2021 14:35:36 -------------------------------------------------------------------------------- Wound Assessment Details Patient Name: Date of Service: [name], [name] 12/24/2021 2:30 PM Medical Record Number: [id number] Patient Account Number: [id number] Date of Birth/Sex: Treating RN: November 22, 1935 (86 y.o. [name] Primary Care Aleicia Kenagy: [name] Other Clinician: Referring Avarose Mervine: Treating Jericho Cieslik/Extender: Carmie End in Treatment: 64 Wound Status Wound Number: 4 Primary Pressure Ulcer Etiology: Wound Location: Right, Proximal, Lateral Lower Leg Wound Status: Open Wounding Event: Trauma Comorbid Anemia, Deep Vein Thrombosis, Hypertension, Type II Date Acquired: 09/08/2021 History: Diabetes, Dementia Weeks Of Treatment: 15 Clustered Wound: No Photos Wound Measurements Length: (cm) 0.6 Width: (cm) 0.3 Depth: (cm) 0.1 Area: (cm) 0.141 Volume: (cm) 0.014 % Reduction in Area: -19.5% % Reduction in Volume: -16.7% Epithelialization: Small (1-33%) Tunneling: No Undermining: No Wound Description Classification: Category/Stage II Wound Margin: Distinct, outline attached Exudate Amount: Medium Exudate Type: Serosanguineous Exudate Color: red, brown Foul Odor After Cleansing: No Slough/Fibrino No Wound Bed Granulation Amount: Small (1-33%)  Exposed Structure Granulation Quality: Pink Fascia Exposed: No Necrotic Amount: Large (67-100%) Fat Layer (Subcutaneous Tissue) Exposed: No Necrotic Quality: Adherent Slough Tendon Exposed: No Muscle Exposed: No Joint Exposed: No Bone Exposed: No Treatment Notes Wound #4 (Lower Leg) Wound Laterality: Right, Lateral, Proximal Cleanser Soap and Water Discharge Instruction: May shower and wash wound with dial antibacterial soap and water prior to dressing change. Wound Cleanser Discharge Instruction: Cleanse the wound with wound cleanser prior to applying a clean dressing using gauze sponges, not tissue or cotton balls. Peri-Wound Care Topical Primary Dressing KerraCel Ag Gelling  Fiber Dressing, 4x5 in (silver alginate) Discharge Instruction: Apply silver alginate to wound bed as instructed Xeroform Gauze Dressing, 2x2 (in/in) HBD Secondary Dressing Bordered Gauze, 4x4 in Discharge Instruction: Apply over primary dressing as directed. Secured With Compression Wrap Compression Stockings Environmental education officer) Signed: 12/24/2021 5:17:06 PM By: Dellie Catholic RN Entered By: Dellie Catholic on 12/24/2021 14:28:32 -------------------------------------------------------------------------------- Wound Assessment Details Patient Name: Date of Service: [name], [name] 12/24/2021 2:30 PM Medical Record Number: [id number] Patient Account Number: [id number] Date of Birth/Sex: Treating RN: 07-20-1935 (86 y.o. [name] Primary Care Brynn Reznik: [name] Other Clinician: Referring Michio Thier: Treating Adelia Baptista/Extender: Carmie End in Treatment: 64 Wound Status Wound Number: 6 Primary Pressure Ulcer Etiology: Wound Location: Left Trochanter Wound Status: Open Wounding Event: Pressure Injury Comorbid Anemia, Deep Vein Thrombosis, Hypertension, Type II Date Acquired: 09/17/2021 History: Diabetes, Dementia Weeks Of Treatment:  9 Clustered Wound: No Photos Wound Measurements Length: (cm) 2 Width: (cm) 2.1 Depth: (cm) 0.1 Area: (cm) 3.299 Volume: (cm) 0.33 % Reduction in Area: -10541.9% % Reduction in Volume: -10900% Epithelialization: Small (1-33%) Tunneling: No Undermining: No Wound Description Classification: Unstageable/Unclassified Wound Margin: Flat and Intact Exudate Amount: Medium Exudate Type: Serosanguineous Exudate Color: red, brown Foul Odor After Cleansing: No Slough/Fibrino Yes Wound Bed Granulation Amount: Small (1-33%) Exposed Structure Granulation Quality: Pink Fascia Exposed: No Necrotic Amount: Large (67-100%) Fat Layer (Subcutaneous Tissue) Exposed: No Necrotic Quality: Eschar, Adherent Slough Tendon Exposed: No Muscle Exposed: No Joint Exposed: No Bone Exposed: No Treatment Notes Wound #6 (Trochanter) Wound Laterality: Left Cleanser Soap and Water Discharge Instruction: May shower and wash wound with dial antibacterial soap and water prior to dressing change. Wound Cleanser Discharge Instruction: Cleanse the wound with wound cleanser prior to applying a clean dressing using gauze sponges, not tissue or cotton balls. Peri-Wound Care Topical Gentamicin Discharge Instruction: As directed by physician Primary Dressing KerraCel Ag Gelling Fiber Dressing, 2x2 in (silver alginate) Discharge Instruction: Apply silver alginate to wound bed as instructed Secondary Dressing Bordered Gauze, 4x4 in Discharge Instruction: Apply over primary dressing as directed. Secured With Compression Wrap Compression Stockings Environmental education officer) Signed: 12/24/2021 5:17:06 PM By: Dellie Catholic RN Entered By: Dellie Catholic on 12/24/2021 14:37:16 -------------------------------------------------------------------------------- Vitals Details Patient Name: Date of Service: [name] 12/24/2021 2:30 PM Medical Record Number: [id number] Patient Account Number:  [id number] Date of Birth/Sex: Treating RN: 1935/12/10 (86 y.o. [name] Primary Care Izak Anding: [name] Other Clinician: Referring Wilmon Conover: Treating Anh Bigos/Extender: Carmie End in Treatment: 89 Vital Signs Time Taken: 14:15 Temperature (F): 97.5 Height (in): 68 Pulse (bpm): 80 Weight (lbs): 110 Respiratory Rate (breaths/min): 14 Body Mass Index (BMI): 16.7 Blood Pressure (mmHg): 171/83 Reference Range: 80 - 120 mg / dl Electronic Signature(s) Signed: 12/24/2021 5:17:06 PM By: Dellie Catholic RN Entered By: Dellie Catholic on 12/24/2021 14:23:16

## 2021-12-24 NOTE — Progress Notes (Signed)
[name] ([id number]) , Visit Report for 12/24/2021 Chief Complaint Document Details Patient Name: Date of Service: Carolyn, Contreras 12/24/2021 2:30 PM Medical Record Number: [id number] Patient Account Number: [id number] Date of Birth/Sex: Treating RN: 1935-08-09 (86 y.o. F) Primary Care Provider: [name] Other Clinician: Referring Provider: Treating Provider/Extender: Carmie End in Treatment: 12 Information Obtained from: Patient Chief Complaint LEFT buttocks pressure ulcer Electronic Signature(s) Signed: 12/24/2021 3:07:35 PM By: Fredirick Maudlin MD FACS Entered By: Fredirick Maudlin on 12/24/2021 15:07:34 -------------------------------------------------------------------------------- Debridement Details Patient Name: Date of Service: [name] 12/24/2021 2:30 PM Medical Record Number: [id number] Patient Account Number: [id number] Date of Birth/Sex: Treating RN: April 08, 1936 (86 y.o. [name] Primary Care Provider: [name] Other Clinician: Referring Provider: Treating Provider/Extender: Carmie End in Treatment: 64 Debridement Performed for Assessment: Wound #2 Left Gluteus Performed By: Physician Fredirick Maudlin, MD Debridement Type: Debridement Level of Consciousness (Pre-procedure): Awake and Alert Pre-procedure Verification/Time Out Yes - 14:55 Taken: Start Time: 14:55 Pain Control: Lidocaine 4% T opical Solution T Area Debrided (L x W): otal 4.1 (cm) x 2 (cm) = 8.2 (cm) Tissue and other material debrided: Non-Viable, Slough, Slough Level: Non-Viable Tissue Debridement Description: Selective/Open Wound Instrument: Curette Bleeding: Minimum Hemostasis Achieved: Pressure End Time: 14:57 Procedural Pain: 0 Post Procedural Pain: 0 Response to Treatment: Procedure was tolerated well Level of Consciousness (Post- Awake and Alert procedure): Post Debridement Measurements of Total  Wound Length: (cm) 4.1 Stage: Category/Stage III Width: (cm) 2 Depth: (cm) 1.4 Volume: (cm) 9.016 Character of Wound/Ulcer Post Debridement: Improved Post Procedure Diagnosis Same as Pre-procedure Electronic Signature(s) Signed: 12/24/2021 4:05:21 PM By: Fredirick Maudlin MD FACS Signed: 12/24/2021 5:17:06 PM By: Dellie Catholic RN Entered By: Dellie Catholic on 12/24/2021 15:01:36 -------------------------------------------------------------------------------- Debridement Details Patient Name: Date of Service: [name] 12/24/2021 2:30 PM Medical Record Number: [id number] Patient Account Number: [id number] Date of Birth/Sex: Treating RN: [birth year] (86 y.o. [name] Primary Care Provider: [name] Other Clinician: Referring Provider: Treating Provider/Extender: Carmie End in Treatment: 64 Debridement Performed for Assessment: Wound #4 Right,Proximal,Lateral Lower Leg Performed By: Physician Fredirick Maudlin, MD Debridement Type: Debridement Level of Consciousness (Pre-procedure): Awake and Alert Pre-procedure Verification/Time Out Yes - 14:55 Taken: Start Time: 14:55 Pain Control: Lidocaine 4% T opical Solution T Area Debrided (L x W): otal 0.6 (cm) x 0.3 (cm) = 0.18 (cm) Tissue and other material debrided: Non-Viable, Eschar, Slough, Subcutaneous, Slough Level: Skin/Subcutaneous Tissue Debridement Description: Excisional Instrument: Curette Bleeding: Minimum Hemostasis Achieved: Pressure End Time: 14:57 Procedural Pain: 0 Post Procedural Pain: 0 Response to Treatment: Procedure was tolerated well Level of Consciousness (Post- Awake and Alert procedure): Post Debridement Measurements of Total Wound Length: (cm) 0.6 Stage: Category/Stage II Width: (cm) 0.3 Depth: (cm) 0.1 Volume: (cm) 0.014 Character of Wound/Ulcer Post Debridement: Improved Post Procedure Diagnosis Same as Pre-procedure Electronic  Signature(s) Signed: 12/24/2021 4:05:21 PM By: Fredirick Maudlin MD FACS Signed: 12/24/2021 5:17:06 PM By: Dellie Catholic RN Entered By: Dellie Catholic on 12/24/2021 15:02:28 -------------------------------------------------------------------------------- Debridement Details Patient Name: Date of Service: [name] 12/24/2021 2:30 PM Medical Record Number: [id number] Patient Account Number: [id number] Date of Birth/Sex: Treating RN: Sep 08, 1935 (86 y.o. [name] Primary Care Provider: [name] Other Clinician: Referring Provider: Treating Provider/Extender: Carmie End in Treatment: 64 Debridement Performed for Assessment: Wound #6 Left Trochanter Performed By: Physician Fredirick Maudlin, MD Debridement Type: Debridement Level of Consciousness (Pre-procedure): Awake and Alert Pre-procedure Verification/Time Out Yes -  14:55 Taken: Start Time: 14:55 Pain Control: Lidocaine 4% T opical Solution T Area Debrided (L x W): otal 2 (cm) x 2.1 (cm) = 4.2 (cm) Tissue and other material debrided: Non-Viable, Slough, Subcutaneous, Slough Level: Skin/Subcutaneous Tissue Debridement Description: Excisional Instrument: Curette Specimen: Tissue Culture Number of Specimens T aken: 1 Bleeding: Minimum Hemostasis Achieved: Pressure End Time: 14:57 Procedural Pain: 0 Post Procedural Pain: 0 Response to Treatment: Procedure was tolerated well Level of Consciousness (Post- Awake and Alert procedure): Post Debridement Measurements of Total Wound Length: (cm) 2 Stage: Unstageable/Unclassified Width: (cm) 2.1 Depth: (cm) 0.1 Volume: (cm) 0.33 Character of Wound/Ulcer Post Debridement: Improved Post Procedure Diagnosis Same as Pre-procedure Electronic Signature(s) Signed: 12/24/2021 4:05:21 PM By: Fredirick Maudlin MD FACS Signed: 12/24/2021 5:17:06 PM By: Dellie Catholic RN Entered By: Dellie Catholic on 12/24/2021  15:53:05 -------------------------------------------------------------------------------- HPI Details Patient Name: Date of Service: [name] 12/24/2021 2:30 PM Medical Record Number: [id number] Patient Account Number: [id number] Date of Birth/Sex: Treating RN: June 16, 1935 (86 y.o. F) Primary Care Provider: [name] Other Clinician: Referring Provider: Treating Provider/Extender: Carmie End in Treatment: 90 History of Present Illness HPI Description: Admission 5/27 Ms. Joni Colegrove is an 86 year old female with a past medical history of type 2 diabetes on oral agents, hypertension and dementia that presents today to the clinic for a wound to her right buttocks. Her daughter-in-law is present today and helps provide the history. This issue has been going on for 2 to 3 weeks now. She states she developed an eschar about 1 week ago. She has been taking doxycycline for possible soft tissue infection to the area prescribed by her primary care provider. Patient currently denies any signs of infection. 6/3; patient admitted to the clinic last week. She has a large necrotic area on her left buttock. They use Santyl last week. On arrival this visit she had completely necrotic surface with open to subcutaneous tissue. The surface was completely nonviable. They have been using Santyl. She apparently had been on doxycycline which she is just finishing prescribed by her primary doctor for possible soft tissue infection 6/7; patient with a large necrotic wound over her left buttock. Aggressive debridement last week. Culture of this grew Proteus unfortunately would not of been covered well or at least predictably by the doxycycline that her primary doctor put her on. I have therefore put her on Augmentin suspension 3 times a day. We are using silver alginate as the primary dressing while we deal with the infection. Her daughter has a list of concerns including  swallowing difficulties, concerns about aspiration. The patient has advanced dementia. If this is Alzheimer's disease it is at its preterminal stages. I went over that swallowing difficulties are part of what happens in the preterminal stages of Alzheimer's disease. Nevertheless we will family members seems to want to pursue a very aggressive course 6/21 large wound over her left buttock. She has completed the antibiotics I gave her [Augmentin]. We are using silver alginate while we are dealing with the infection and also to help with the drainage In general the wound looks better she has healthy granulation over perhaps 70% of it. Superiorly there is still necrotic debris I did not attempt to debride this today The patient has advanced dementia. I do not know that she could tolerate a wound VAC. She also has double incontinence which would make that difficult. Nevertheless she has been cared for diligently by her family. She is apparently eating well and may be 2  to 3 hours on this area all day 7/5; 2-week follow-up. She continues with a nice improvement in overall condition of the wound bed. The undermining area has still some adherent surface slough. We have been using silver alginate because of the drainage and possibility of at least superficial wound infection/colonization. Although I said in previous notes I wondered whether she could tolerate the wound VAC they have done so well with this wound at home I do not want to necessarily rule her out for a wound VAC and I am going to direct the start of a trial of wound VAC therapy if this can be covered by insurance, if we have home health support to change it at all 7/19; we did not get very far with the wound VAC because my original ICD 10 said this was unstageable. As usual this represents a considerable frustration because we would not of ordered a wound VAC if the wound was still unstageable. This currently represents a stage IV staging. This  is all the way down through muscle layer. There is a rim of tissue over the bone and no palpable exposed bone but this is a deep wound. Fortunately at present no evidence of infection. We have been using silver collagen with backing wet-to-dry. The wound is really no better today but no worse 8/2; patient presents for 2-week follow-up. She has been using the wound VAC for the past week. There has been some issues with keeping the drape in place however home health comes out to reapply the drape when this happens. Overall there are no issues or complaints today. No signs of infection. 8/17; patient has been using the wound VAC on her wound on the upper left buttock. About 50 to 60% of this area is fully granulated however from about 10-5 o'clock there is still undermining. UNFORTUNATELY she also arrives in clinic today with a wound over her right fibular head. According to her daughter has been there for about 2 weeks. She wonders whether there is an abrasion although no concrete history of this. Given her frail status this is probably most likely a pressure ulcer over a bony prominence [fibular head] 8/31 unfortunately the upper left buttock pressure ulcer stage IV is not as optimistically better as I was hoping. The granulation that I identified on 12/23/20 is no longer adherent at the inferior pole the undermining is quite extensive but there is no palpable bone. No evidence of surrounding infection. There may be some improvement in the undermining measurements We are still dealing with the additional wound we identified 2 weeks ago on the right fibular head as well we have been using silver alginate here 9/14; 1 month follow-up. She has now had the wound VAC on for probably 2 months. No major change here if she still has the tunneling area at 12:00 and undermining from about 6-12. Surface of the wound does not look healthy. We use MolecuLight to look at the wound surface which actually looked  negative however she has extensive immunofluorescence in the wound edge from about 12-6 o'clock this actually extends beyond the visible wound margin by quite amount. 9/27; no major change in the left buttock wound. There is still undermining however no exposed bone. Granulation looks reasonable. Right fibular head is much smaller We are using Hydrofera Blue on the right fibular head silver alginate on the buttock area 10/12; left buttock wound is large with undermining. I think this is about the same as last time granulation looks reasonable  there is no exposed bone. The area on the fibular head is almost completely closed 11/30; left buttock wound large wound with undermining superiorly. As her daughter points out there is some change in the granulation superiorly and more darker red color also some raised areas of hypergranulation. I cannot tell that the area is infected. We are using silver alginate she is changing this daily On the right fibular head the area is just about closed 12/28; the patient's wound over her right fibular head is closed. Her left buttock ulcer looks actually a little better. Measurements are slightly improved. There is no exposed bone. The patient's daughter came in asking me about more aggressive options to attempt to get closure of the subcu wound including surgery. I thought we had mostly agreed that this would be a palliative type setting and that I did not expect this to actually close nevertheless I tried to answer her questions. I do not think the patient is a candidate for a myocutaneous flap. She is simply too frail and I doubt plastic surgery would consider her a viable candidate. She might be a candidate for an advanced treatment option like Oasis which sometimes can stimulate granulation to get these wounds to gradually close. I told her in order for this to make sense she would need probably a Foley catheter to control the incontinence and unfortunately  weekly visits which would be difficult on this patient 1/25; 1 month follow-up. The patient has a large stage IV left buttock ulcer. Substantially deep but no exposed bone. She has advanced dementia. I do not think she is a candidate for anything aggressive and I follow her on a palliative basis. I been over this with her daughter many times. We have been using silver alginate backing ABDs. She has a level 3 surface on her mattress in spite of this she developed some degree of erythema today on the upper left pelvis 2/22; 1 month follow-up. Large left buttock pressure ulcer. We've been using silver alginate as a palliative dressing. Essentially deep wound with no exposed bone. No evidence of surrounding infection. This patient has advanced/preterminal dementia. Her daughter reports that he had a time where she was not eating very well although she seems to be improved lately. she does not appear to be systemically unwell 07/28/2021: 1 month follow-up. The overall wound dimensions are little bit smaller but she does have significant undermining and epibole. They have been using silver alginate in the wound. There is a small amount of slough buildup. The daughter-in-law that is with her today also expressed concern about some discoloration on the patient's left trochanter and right fibular head. 09/08/2021: 6-week follow-up. No significant change to the left buttock ulcer. There is a bit of slough buildup on the surface but it is otherwise clean without significant drainage or odor. The left trochanter looks like it may have partially broken down and then subsequently healed without intervention; there is some eschar overlying this and when I removed it, there was fresh pink tissue but no opening. On the right fibular head, this site has opened and there are 2 small wounds with a bit of slough accumulation. No odor or drainage. 10/20/2021: 6-week follow-up. No real change to the left buttock ulcer. The  left trochanter is open now. It is fairly superficial but does extend into the fat layer. The right fibular head is clean and limited to the skin. She has a new area of tissue breakdown overlying her sacrum, just medial to  her large left buttock ulcer. This looks like it is secondary to shear. 12/01/2021: 6-week follow-up. The left buttock ulcer remains unchanged. There is a light layer of slough and biofilm on the surface. The left trochanter has a heavy layer of eschar on it. The right fibular head wound is deeper today and has heavy slough accumulation. The area of tissue breakdown on her sacrum that was seen at her last visit has healed. 12/24/2021: The patient's daughter-in-law brought her to clinic today, earlier than her usual 6-week interval, because she had a lot more pain and drainage coming from her left trochanter. On inspection, the area is fluctuant and when pressure was applied, frank pus began draining from underneath the fibrinous eschar overlying the wound. The wound on her right fibular head is a little bit smaller but has some nonviable subcutaneous tissue underneath some slough. The sacral wound is unchanged with just a light layer of slough accumulation. Electronic Signature(s) Signed: 12/24/2021 3:09:11 PM By: Fredirick Maudlin MD FACS Entered By: Fredirick Maudlin on 12/24/2021 15:09:11 -------------------------------------------------------------------------------- Physical Exam Details Patient Name: Date of Service: Carolyn, Contreras 12/24/2021 2:30 PM Medical Record Number: [id number] Patient Account Number: [id number] Date of Birth/Sex: Treating RN: January 03, 1936 (86 y.o. F) Primary Care Provider: [name] Other Clinician: Referring Provider: Treating Provider/Extender: Carmie End in Treatment: 33 Constitutional Hypertensive, asymptomatic. . . . No acute distress.Marland Kitchen Respiratory Normal work of breathing on room air.. Notes 12/24/2021:  The patient has a lot more pain and drainage coming from her left trochanter. On inspection, the area is fluctuant and when pressure was applied, frank pus began draining from underneath the fibrinous eschar overlying the wound. The wound on her right fibular head is a little bit smaller but has some nonviable subcutaneous tissue underneath some slough. The sacral wound is unchanged with just a light layer of slough accumulation. Electronic Signature(s) Signed: 12/24/2021 3:10:12 PM By: Fredirick Maudlin MD FACS Entered By: Fredirick Maudlin on 12/24/2021 15:10:12 -------------------------------------------------------------------------------- Physician Orders Details Patient Name: Date of Service: Carolyn, Contreras 12/24/2021 2:30 PM Medical Record Number: [id number] Patient Account Number: [id number] Date of Birth/Sex: Treating RN: [birth year] (86 y.o. [name] Primary Care Provider: [name] Other Clinician: Referring Provider: Treating Provider/Extender: Carmie End in Treatment: 8 Verbal / Phone Orders: No Diagnosis Coding ICD-10 Coding Code Description [phone] Pressure ulcer of left buttock, stage 4 E11.9 Type 2 diabetes mellitus without complications D92.426 Pressure ulcer of left hip, stage 3 L89.893 Pressure ulcer of other site, stage 3 Follow-up Appointments ppointment in: - Dr Celine Ahr Room 3 next appointment : 01/12/22 Return A Anesthetic (In clinic) Topical Lidocaine 5% applied to wound bed - In clinic Bathing/ Shower/ Hygiene May shower and wash wound with soap and water. - prior to dressing change Off-Loading Low air-loss mattress (Group 2) Turn and reposition every 2 hours Additional Orders / Instructions Follow Nutritious Diet - 100-120g of Protein Non Wound Condition Bilateral Lower Extremities Protect area with: - Bilateral Lateral Knees , and Right Trochanter. Protect with Silicone Foam Borders or ABD pads [facility] wound care orders this week; continue Home Health for wound care. May utilize formulary equivalent dressing for wound treatment orders unless otherwise specified. - Santyl to Left Trochanter. Other Home Health Orders/Instructions: - [facility] Wound Treatment Wound #2 - Gluteus Wound Laterality: Left Cleanser: Soap and Water Every Other Day/30 Days Discharge Instructions: May shower and wash wound with dial antibacterial soap and water prior to dressing change. Cleanser:  Wound Cleanser Every Other Day/30 Days Discharge Instructions: Cleanse the wound with wound cleanser prior to applying a clean dressing using gauze sponges, not tissue or cotton balls. Prim Dressing: KerraCel Ag Gelling Fiber Dressing, 4x5 in (silver alginate) (Generic) Every Other Day/30 Days ary Discharge Instructions: Apply silver alginate to wound bed as instructed Secondary Dressing: Bordered Gauze, 4x4 in (Generic) Every Other Day/30 Days Discharge Instructions: Apply over primary dressing as directed. Wound #4 - Lower Leg Wound Laterality: Right, Lateral, Proximal Cleanser: Soap and Water Every Other Day/30 Days Discharge Instructions: May shower and wash wound with dial antibacterial soap and water prior to dressing change. Cleanser: Wound Cleanser Every Other Day/30 Days Discharge Instructions: Cleanse the wound with wound cleanser prior to applying a clean dressing using gauze sponges, not tissue or cotton balls. Prim Dressing: KerraCel Ag Gelling Fiber Dressing, 4x5 in (silver alginate) (Generic) Every Other Day/30 Days ary Discharge Instructions: Apply silver alginate to wound bed as instructed Prim Dressing: Xeroform Gauze Dressing, 2x2 (in/in) HBD (Generic) Every Other Day/30 Days ary Secondary Dressing: Bordered Gauze, 4x4 in (Generic) Every Other Day/30 Days Discharge Instructions: Apply over primary dressing as directed. Wound #6 - Trochanter Wound Laterality: Left Cleanser: Soap and Water Every  Other Day/30 Days Discharge Instructions: May shower and wash wound with dial antibacterial soap and water prior to dressing change. Cleanser: Wound Cleanser Every Other Day/30 Days Discharge Instructions: Cleanse the wound with wound cleanser prior to applying a clean dressing using gauze sponges, not tissue or cotton balls. Topical: Gentamicin Every Other Day/30 Days Discharge Instructions: As directed by physician Prim Dressing: KerraCel Ag Gelling Fiber Dressing, 2x2 in (silver alginate) Every Other Day/30 Days ary Discharge Instructions: Apply silver alginate to wound bed as instructed Secondary Dressing: Bordered Gauze, 4x4 in (Generic) Every Other Day/30 Days Discharge Instructions: Apply over primary dressing as directed. Patient Medications llergies: No Known Allergies A Notifications Medication Indication Start End 12/24/2021 gentamicin DOSE topical 0.1 % ointment - Apply to surface of left trochanter wound with each dressing change 12/24/2021 amoxicillin-pot clavulanate DOSE oral 600 mg/5 mL-42.9 mg/5 mL suspension for reconstitution - 6.5 mL PO BID x 10 days Electronic Signature(s) Signed: 12/24/2021 4:34:02 PM By: Fredirick Maudlin MD FACS Signed: 12/24/2021 5:17:06 PM By: Dellie Catholic RN Previous Signature: 12/24/2021 3:14:20 PM Version By: Fredirick Maudlin MD FACS Entered By: Dellie Catholic on 12/24/2021 16:18:42 -------------------------------------------------------------------------------- Problem List Details Patient Name: Date of Service: [name] 12/24/2021 2:30 PM Medical Record Number: [id number] Patient Account Number: [id number] Date of Birth/Sex: Treating RN: [birth year] (86 y.o. F) Primary Care Provider: [name] Other Clinician: Referring Provider: Treating Provider/Extender: Carmie End in Treatment: 43 Active Problems ICD-10 Encounter Code Description Active Date MDM Diagnosis L89.324 Pressure ulcer of  left buttock, stage 4 11/24/2020 No Yes E11.9 Type 2 diabetes mellitus without complications 11/14/2954 No Yes L89.223 Pressure ulcer of left hip, stage 3 07/28/2021 No Yes L89.893 Pressure ulcer of other site, stage 3 07/28/2021 No Yes Inactive Problems ICD-10 Code Description Active Date Inactive Date L89.320 Pressure ulcer of left buttock, unstageable 10/13/2020 10/13/2020 L89.93 Pressure ulcer of unspecified site, stage 3 12/23/2020 12/23/2020 Resolved Problems Electronic Signature(s) Signed: 12/24/2021 3:04:43 PM By: Fredirick Maudlin MD FACS Entered By: Fredirick Maudlin on 12/24/2021 15:04:42 -------------------------------------------------------------------------------- Progress Note Details Patient Name: Date of Service: [name] 12/24/2021 2:30 PM Medical Record Number: [id number] Patient Account Number: [id number] Date of Birth/Sex: Treating RN: [birth year] (86 y.o. F) Primary Care Provider: [name] Other Clinician: Referring Provider:  Treating Provider/Extender: Carmie End in Treatment: 64 Subjective Chief Complaint Information obtained from Patient LEFT buttocks pressure ulcer History of Present Illness (HPI) Admission 5/27 Ms. Verlaine Embry is an 86 year old female with a past medical history of type 2 diabetes on oral agents, hypertension and dementia that presents today to the clinic for a wound to her right buttocks. Her daughter-in-law is present today and helps provide the history. This issue has been going on for 2 to 3 weeks now. She states she developed an eschar about 1 week ago. She has been taking doxycycline for possible soft tissue infection to the area prescribed by her primary care provider. Patient currently denies any signs of infection. 6/3; patient admitted to the clinic last week. She has a large necrotic area on her left buttock. They use Santyl last week. On arrival this visit she had completely necrotic surface  with open to subcutaneous tissue. The surface was completely nonviable. They have been using Santyl. She apparently had been on doxycycline which she is just finishing prescribed by her primary doctor for possible soft tissue infection 6/7; patient with a large necrotic wound over her left buttock. Aggressive debridement last week. Culture of this grew Proteus unfortunately would not of been covered well or at least predictably by the doxycycline that her primary doctor put her on. I have therefore put her on Augmentin suspension 3 times a day. We are using silver alginate as the primary dressing while we deal with the infection. Her daughter has a list of concerns including swallowing difficulties, concerns about aspiration. The patient has advanced dementia. If this is Alzheimer's disease it is at its preterminal stages. I went over that swallowing difficulties are part of what happens in the preterminal stages of Alzheimer's disease. Nevertheless we will family members seems to want to pursue a very aggressive course 6/21 large wound over her left buttock. She has completed the antibiotics I gave her [Augmentin]. We are using silver alginate while we are dealing with the infection and also to help with the drainage In general the wound looks better she has healthy granulation over perhaps 70% of it. Superiorly there is still necrotic debris I did not attempt to debride this today The patient has advanced dementia. I do not know that she could tolerate a wound VAC. She also has double incontinence which would make that difficult. Nevertheless she has been cared for diligently by her family. She is apparently eating well and may be 2 to 3 hours on this area all day 7/5; 2-week follow-up. She continues with a nice improvement in overall condition of the wound bed. The undermining area has still some adherent surface slough. We have been using silver alginate because of the drainage and possibility of  at least superficial wound infection/colonization. Although I said in previous notes I wondered whether she could tolerate the wound VAC they have done so well with this wound at home I do not want to necessarily rule her out for a wound VAC and I am going to direct the start of a trial of wound VAC therapy if this can be covered by insurance, if we have home health support to change it at all 7/19; we did not get very far with the wound VAC because my original ICD 10 said this was unstageable. As usual this represents a considerable frustration because we would not of ordered a wound VAC if the wound was still unstageable. This currently represents a stage IV staging.  This is all the way down through muscle layer. There is a rim of tissue over the bone and no palpable exposed bone but this is a deep wound. Fortunately at present no evidence of infection. We have been using silver collagen with backing wet-to-dry. The wound is really no better today but no worse 8/2; patient presents for 2-week follow-up. She has been using the wound VAC for the past week. There has been some issues with keeping the drape in place however home health comes out to reapply the drape when this happens. Overall there are no issues or complaints today. No signs of infection. 8/17; patient has been using the wound VAC on her wound on the upper left buttock. About 50 to 60% of this area is fully granulated however from about 10-5 o'clock there is still undermining. UNFORTUNATELY she also arrives in clinic today with a wound over her right fibular head. According to her daughter has been there for about 2 weeks. She wonders whether there is an abrasion although no concrete history of this. Given her frail status this is probably most likely a pressure ulcer over a bony prominence [fibular head] 8/31 unfortunately the upper left buttock pressure ulcer stage IV is not as optimistically better as I was hoping. The granulation  that I identified on 12/23/20 is no longer adherent at the inferior pole the undermining is quite extensive but there is no palpable bone. No evidence of surrounding infection. There may be some improvement in the undermining measurements We are still dealing with the additional wound we identified 2 weeks ago on the right fibular head as well we have been using silver alginate here 9/14; 1 month follow-up. She has now had the wound VAC on for probably 2 months. No major change here if she still has the tunneling area at 12:00 and undermining from about 6-12. Surface of the wound does not look healthy. We use MolecuLight to look at the wound surface which actually looked negative however she has extensive immunofluorescence in the wound edge from about 12-6 o'clock this actually extends beyond the visible wound margin by quite amount. 9/27; no major change in the left buttock wound. There is still undermining however no exposed bone. Granulation looks reasonable. oo Right fibular head is much smaller We are using Hydrofera Blue on the right fibular head silver alginate on the buttock area 10/12; left buttock wound is large with undermining. I think this is about the same as last time granulation looks reasonable there is no exposed bone. The area on the fibular head is almost completely closed 11/30; left buttock wound large wound with undermining superiorly. As her daughter points out there is some change in the granulation superiorly and more darker red color also some raised areas of hypergranulation. I cannot tell that the area is infected. We are using silver alginate she is changing this daily On the right fibular head the area is just about closed 12/28; the patient's wound over her right fibular head is closed. Her left buttock ulcer looks actually a little better. Measurements are slightly improved. There is no exposed bone. The patient's daughter came in asking me about more aggressive  options to attempt to get closure of the subcu wound including surgery. I thought we had mostly agreed that this would be a palliative type setting and that I did not expect this to actually close nevertheless I tried to answer her questions. I do not think the patient is a candidate for a  myocutaneous flap. She is simply too frail and I doubt plastic surgery would consider her a viable candidate. She might be a candidate for an advanced treatment option like Oasis which sometimes can stimulate granulation to get these wounds to gradually close. I told her in order for this to make sense she would need probably a Foley catheter to control the incontinence and unfortunately weekly visits which would be difficult on this patient 1/25; 1 month follow-up. The patient has a large stage IV left buttock ulcer. Substantially deep but no exposed bone. She has advanced dementia. I do not think she is a candidate for anything aggressive and I follow her on a palliative basis. I been over this with her daughter many times. We have been using silver alginate backing ABDs. She has a level 3 surface on her mattress in spite of this she developed some degree of erythema today on the upper left pelvis 2/22; 1 month follow-up. Large left buttock pressure ulcer. We've been using silver alginate as a palliative dressing. Essentially deep wound with no exposed bone. No evidence of surrounding infection. This patient has advanced/preterminal dementia. Her daughter reports that he had a time where she was not eating very well although she seems to be improved lately. she does not appear to be systemically unwell 07/28/2021: 1 month follow-up. The overall wound dimensions are little bit smaller but she does have significant undermining and epibole. They have been using silver alginate in the wound. There is a small amount of slough buildup. The daughter-in-law that is with her today also expressed concern about  some discoloration on the patient's left trochanter and right fibular head. 09/08/2021: 6-week follow-up. No significant change to the left buttock ulcer. There is a bit of slough buildup on the surface but it is otherwise clean without significant drainage or odor. The left trochanter looks like it may have partially broken down and then subsequently healed without intervention; there is some eschar overlying this and when I removed it, there was fresh pink tissue but no opening. On the right fibular head, this site has opened and there are 2 small wounds with a bit of slough accumulation. No odor or drainage. 10/20/2021: 6-week follow-up. No real change to the left buttock ulcer. The left trochanter is open now. It is fairly superficial but does extend into the fat layer. The right fibular head is clean and limited to the skin. She has a new area of tissue breakdown overlying her sacrum, just medial to her large left buttock ulcer. This looks like it is secondary to shear. 12/01/2021: 6-week follow-up. The left buttock ulcer remains unchanged. There is a light layer of slough and biofilm on the surface. The left trochanter has a heavy layer of eschar on it. The right fibular head wound is deeper today and has heavy slough accumulation. The area of tissue breakdown on her sacrum that was seen at her last visit has healed. 12/24/2021: The patient's daughter-in-law brought her to clinic today, earlier than her usual 6-week interval, because she had a lot more pain and drainage coming from her left trochanter. On inspection, the area is fluctuant and when pressure was applied, frank pus began draining from underneath the fibrinous eschar overlying the wound. The wound on her right fibular head is a little bit smaller but has some nonviable subcutaneous tissue underneath some slough. The sacral wound is unchanged with just a light layer of slough accumulation. Patient History Unable to Obtain Patient  History due to  Dementia. Information obtained from Patient. Family History Stroke - Siblings, No family history of Cancer, Diabetes, Heart Disease, Hereditary Spherocytosis, Hypertension, Kidney Disease, Lung Disease, Seizures, Thyroid Problems, Tuberculosis. Social History Never smoker, Marital Status - Divorced, Alcohol Use - Never, Drug Use - No History, Caffeine Use - Never. Medical History Eyes Denies history of Cataracts, Glaucoma, Optic Neuritis Ear/Nose/Mouth/Throat Denies history of Chronic sinus problems/congestion, Middle ear problems Hematologic/Lymphatic Patient has history of Anemia Denies history of Hemophilia, Human Immunodeficiency Virus, Lymphedema, Sickle Cell Disease Respiratory Denies history of Aspiration, Asthma, Chronic Obstructive Pulmonary Disease (COPD), Pneumothorax, Sleep Apnea, Tuberculosis Cardiovascular Patient has history of Deep Vein Thrombosis, Hypertension Denies history of Angina, Arrhythmia, Congestive Heart Failure, Coronary Artery Disease, Hypotension, Myocardial Infarction, Peripheral Arterial Disease, Peripheral Venous Disease, Phlebitis, Vasculitis Gastrointestinal Denies history of Cirrhosis , Colitis, Crohnoos, Hepatitis A, Hepatitis B, Hepatitis C Endocrine Patient has history of Type II Diabetes Denies history of Type I Diabetes Genitourinary Denies history of End Stage Renal Disease Immunological Denies history of Lupus Erythematosus, Raynaudoos, Scleroderma Integumentary (Skin) Denies history of History of Burn Musculoskeletal Denies history of Gout, Rheumatoid Arthritis, Osteoarthritis, Osteomyelitis Neurologic Patient has history of Dementia Denies history of Neuropathy, Quadriplegia, Paraplegia, Seizure Disorder Oncologic Denies history of Received Chemotherapy, Received Radiation Psychiatric Denies history of Anorexia/bulimia, Confinement Anxiety Hospitalization/Surgery History - cholecystectomy. - vaginal hysterectomy.  - vascular surgery. Medical A Surgical History Notes nd Respiratory hx PE Cardiovascular atrial septal aneurysm, hyperlipidemia Genitourinary recent UTi Neurologic CVA Objective Constitutional Hypertensive, asymptomatic. No acute distress.. Vitals Time Taken: 2:15 PM, Height: 68 in, Weight: 110 lbs, BMI: 16.7, Temperature: 97.5 F, Pulse: 80 bpm, Respiratory Rate: 14 breaths/min, Blood Pressure: 171/83 mmHg. Respiratory Normal work of breathing on room air.. General Notes: 12/24/2021: The patient has a lot more pain and drainage coming from her left trochanter. On inspection, the area is fluctuant and when pressure was applied, frank pus began draining from underneath the fibrinous eschar overlying the wound. The wound on her right fibular head is a little bit smaller but has some nonviable subcutaneous tissue underneath some slough. The sacral wound is unchanged with just a light layer of slough accumulation. Integumentary (Hair, Skin) Wound #2 status is Open. Original cause of wound was Pressure Injury. The date acquired was: 09/18/2020. The wound has been in treatment 64 weeks. The wound is located on the Left Gluteus. The wound measures 4.1cm length x 2cm width x 1.4cm depth; 6.44cm^2 area and 9.016cm^3 volume. There is Fat Layer (Subcutaneous Tissue) exposed. There is no tunneling noted, however, there is undermining starting at 12:00 and ending at 12:00 with a maximum distance of 1.4cm. There is a medium amount of serosanguineous drainage noted. The wound margin is epibole. There is medium (34-66%) red, pink granulation within the wound bed. There is a medium (34-66%) amount of necrotic tissue within the wound bed including Adherent Slough. Wound #4 status is Open. Original cause of wound was Trauma. The date acquired was: 09/08/2021. The wound has been in treatment 15 weeks. The wound is located on the Right,Proximal,Lateral Lower Leg. The wound measures 0.6cm length x 0.3cm width x  0.1cm depth; 0.141cm^2 area and 0.014cm^3 volume. There is no tunneling or undermining noted. There is a medium amount of serosanguineous drainage noted. The wound margin is distinct with the outline attached to the wound base. There is small (1-33%) pink granulation within the wound bed. There is a large (67-100%) amount of necrotic tissue within the wound bed including Adherent Slough. Wound #6 status is  Open. Original cause of wound was Pressure Injury. The date acquired was: 09/17/2021. The wound has been in treatment 9 weeks. The wound is located on the Left Trochanter. The wound measures 2cm length x 2.1cm width x 0.1cm depth; 3.299cm^2 area and 0.33cm^3 volume. There is no tunneling or undermining noted. There is a medium amount of serosanguineous drainage noted. The wound margin is flat and intact. There is small (1-33%) pink granulation within the wound bed. There is a large (67-100%) amount of necrotic tissue within the wound bed including Eschar and Adherent Slough. Assessment Active Problems ICD-10 Pressure ulcer of left buttock, stage 4 Type 2 diabetes mellitus without complications Pressure ulcer of left hip, stage 3 Pressure ulcer of other site, stage 3 Procedures Wound #2 Pre-procedure diagnosis of Wound #2 is a Pressure Ulcer located on the Left Gluteus . There was a Selective/Open Wound Non-Viable Tissue Debridement with a total area of 8.2 sq cm performed by Fredirick Maudlin, MD. With the following instrument(s): Curette to remove Non-Viable tissue/material. Material removed includes [facility] after achieving pain control using Lidocaine 4% Topical Solution. No specimens were taken. A time out was conducted at 14:55, prior to the start of the procedure. A Minimum amount of bleeding was controlled with Pressure. The procedure was tolerated well with a pain level of 0 throughout and a pain level of 0 following the procedure. Post Debridement Measurements: 4.1cm length x 2cm width x  1.4cm depth; 9.016cm^3 volume. Post debridement Stage noted as Category/Stage III. Character of Wound/Ulcer Post Debridement is improved. Post procedure Diagnosis Wound #2: Same as Pre-Procedure Wound #4 Pre-procedure diagnosis of Wound #4 is a Pressure Ulcer located on the Right,Proximal,Lateral Lower Leg . There was a Excisional Skin/Subcutaneous Tissue Debridement with a total area of 0.18 sq cm performed by Fredirick Maudlin, MD. With the following instrument(s): Curette to remove Non-Viable tissue/material. Material removed includes Eschar, Subcutaneous Tissue, and Slough after achieving pain control using Lidocaine 4% T opical Solution. No specimens were taken. A time out was conducted at 14:55, prior to the start of the procedure. A Minimum amount of bleeding was controlled with Pressure. The procedure was tolerated well with a pain level of 0 throughout and a pain level of 0 following the procedure. Post Debridement Measurements: 0.6cm length x 0.3cm width x 0.1cm depth; 0.014cm^3 volume. Post debridement Stage noted as Category/Stage II. Character of Wound/Ulcer Post Debridement is improved. Post procedure Diagnosis Wound #4: Same as Pre-Procedure Wound #6 Pre-procedure diagnosis of Wound #6 is a Pressure Ulcer located on the Left Trochanter . There was a Excisional Skin/Subcutaneous Tissue Debridement with a total area of 4.2 sq cm performed by Fredirick Maudlin, MD. With the following instrument(s): Curette to remove Non-Viable tissue/material. Material removed includes Subcutaneous Tissue and Slough and after achieving pain control using Lidocaine 4% T opical Solution. No specimens were taken. A time out was conducted at 14:55, prior to the start of the procedure. A Minimum amount of bleeding was controlled with Pressure. The procedure was tolerated well with a pain level of 0 throughout and a pain level of 0 following the procedure. Post Debridement Measurements: 2cm length x 2.1cm width  x 0.1cm depth; 0.33cm^3 volume. Post debridement Stage noted as Unstageable/Unclassified. Character of Wound/Ulcer Post Debridement is improved. Post procedure Diagnosis Wound #6: Same as Pre-Procedure Plan Follow-up Appointments: Return Appointment in: - Dr Celine Ahr Room 3 next appointment : 01/12/22 Bathing/ Shower/ Hygiene: May shower and wash wound with soap and water. - prior to dressing change Off-Loading:  Low air-loss mattress (Group 2) Turn and reposition every 2 hours Additional Orders / Instructions: Follow Nutritious Diet - 100-120g of Protein Non Wound Condition: Protect area with: - Bilateral Lateral Knees , and Right Trochanter. Protect with Silicone Foam Borders or ABD pads Home Health: New wound care orders this week; continue Home Health for wound care. May utilize formulary equivalent dressing for wound treatment orders unless otherwise specified. - Santyl to Left Trochanter. Other Home Health Orders/Instructions: - [facility] The following medication(s) was prescribed: gentamicin topical 0.1 % ointment Apply to surface of left trochanter wound with each dressing change starting 12/24/2021 amoxicillin-pot clavulanate oral 600 mg/5 mL-42.9 mg/5 mL suspension for reconstitution 6.5 mL PO BID x 10 days starting 12/24/2021 WOUND #2: - Gluteus Wound Laterality: Left Cleanser: Soap and Water Every Other Day/30 Days Discharge Instructions: May shower and wash wound with dial antibacterial soap and water prior to dressing change. Cleanser: Wound Cleanser Every Other Day/30 Days Discharge Instructions: Cleanse the wound with wound cleanser prior to applying a clean dressing using gauze sponges, not tissue or cotton balls. Prim Dressing: KerraCel Ag Gelling Fiber Dressing, 4x5 in (silver alginate) (Generic) Every Other Day/30 Days ary Discharge Instructions: Apply silver alginate to wound bed as instructed Secondary Dressing: Bordered Gauze, 4x4 in (Generic) Every Other Day/30  Days Discharge Instructions: Apply over primary dressing as directed. WOUND #4: - Lower Leg Wound Laterality: Right, Lateral, Proximal Cleanser: Soap and Water Every Other Day/30 Days Discharge Instructions: May shower and wash wound with dial antibacterial soap and water prior to dressing change. Cleanser: Wound Cleanser Every Other Day/30 Days Discharge Instructions: Cleanse the wound with wound cleanser prior to applying a clean dressing using gauze sponges, not tissue or cotton balls. Prim Dressing: KerraCel Ag Gelling Fiber Dressing, 4x5 in (silver alginate) (Generic) Every Other Day/30 Days ary Discharge Instructions: Apply silver alginate to wound bed as instructed Prim Dressing: Xeroform Gauze Dressing, 2x2 (in/in) HBD (Generic) Every Other Day/30 Days ary Secondary Dressing: Bordered Gauze, 4x4 in (Generic) Every Other Day/30 Days Discharge Instructions: Apply over primary dressing as directed. WOUND #6: - Trochanter Wound Laterality: Left Cleanser: Soap and Water Every Other Day/30 Days Discharge Instructions: May shower and wash wound with dial antibacterial soap and water prior to dressing change. Cleanser: Wound Cleanser Every Other Day/30 Days Discharge Instructions: Cleanse the wound with wound cleanser prior to applying a clean dressing using gauze sponges, not tissue or cotton balls. Topical: Gentamicin Every Other Day/30 Days Discharge Instructions: As directed by physician Prim Dressing: KerraCel Ag Gelling Fiber Dressing, 2x2 in (silver alginate) Every Other Day/30 Days ary Discharge Instructions: Apply silver alginate to wound bed as instructed Secondary Dressing: Bordered Gauze, 4x4 in (Generic) Every Other Day/30 Days Discharge Instructions: Apply over primary dressing as directed. 12/24/2021: The patient has a lot more pain and drainage coming from her left trochanter. On inspection, the area is fluctuant and when pressure was applied, frank pus began draining from  underneath the fibrinous eschar overlying the wound. The wound on her right fibular head is a little bit smaller but has some nonviable subcutaneous tissue underneath some slough. The sacral wound is unchanged with just a light layer of slough accumulation. I took a culture of the pus coming from her wound. I then used scissors and forceps to excise the overlying fibrinous eschar. This revealed a cavity full of pus and slough. This was debrided and the pus evacuated. I debrided slough and nonviable subcutaneous tissue from the right fibular head wound and  slough from the sacral wound. I have prescribed topical gentamicin to be used on the trochanter wound and oral Augmentin. Once the culture data return, I will adjust antibiotic therapy as indicated. Continue silver alginate to the other wound sites. Gentamicin and silver alginate to the trochanter wound. Follow-up as scheduled on September 6. Electronic Signature(s) Signed: 12/24/2021 3:15:43 PM By: Fredirick Maudlin MD FACS Entered By: Fredirick Maudlin on 12/24/2021 15:15:42 -------------------------------------------------------------------------------- HxROS Details Patient Name: Date of Service: Carolyn, Contreras 12/24/2021 2:30 PM Medical Record Number: [id number] Patient Account Number: [id number] Date of Birth/Sex: Treating RN: 1935-05-11 (86 y.o. F) Primary Care Provider: [name] Other Clinician: Referring Provider: Treating Provider/Extender: Carmie End in Treatment: 20 Unable to Obtain Patient History due to Dementia Information Obtained From Patient Eyes Medical History: Negative for: Cataracts; Glaucoma; Optic Neuritis Ear/Nose/Mouth/Throat Medical History: Negative for: Chronic sinus problems/congestion; Middle ear problems Hematologic/Lymphatic Medical History: Positive for: Anemia Negative for: Hemophilia; Human Immunodeficiency Virus; Lymphedema; Sickle Cell  Disease Respiratory Medical History: Negative for: Aspiration; Asthma; Chronic Obstructive Pulmonary Disease (COPD); Pneumothorax; Sleep Apnea; Tuberculosis Past Medical History Notes: hx PE Cardiovascular Medical History: Positive for: Deep Vein Thrombosis; Hypertension Negative for: Angina; Arrhythmia; Congestive Heart Failure; Coronary Artery Disease; Hypotension; Myocardial Infarction; Peripheral Arterial Disease; Peripheral Venous Disease; Phlebitis; Vasculitis Past Medical History Notes: atrial septal aneurysm, hyperlipidemia Gastrointestinal Medical History: Negative for: Cirrhosis ; Colitis; Crohns; Hepatitis A; Hepatitis B; Hepatitis C Endocrine Medical History: Positive for: Type II Diabetes Negative for: Type I Diabetes Time with diabetes: 15 years Treated with: Oral agents Blood sugar tested every day: Yes Tested : 2 times per day Genitourinary Medical History: Negative for: End Stage Renal Disease Past Medical History Notes: recent UTi Immunological Medical History: Negative for: Lupus Erythematosus; Raynauds; Scleroderma Integumentary (Skin) Medical History: Negative for: History of Burn Musculoskeletal Medical History: Negative for: Gout; Rheumatoid Arthritis; Osteoarthritis; Osteomyelitis Neurologic Medical History: Positive for: Dementia Negative for: Neuropathy; Quadriplegia; Paraplegia; Seizure Disorder Past Medical History Notes: CVA Oncologic Medical History: Negative for: Received Chemotherapy; Received Radiation Psychiatric Medical History: Negative for: Anorexia/bulimia; Confinement Anxiety Immunizations Pneumococcal Vaccine: Received Pneumococcal Vaccination: No Implantable Devices None Hospitalization / Surgery History Type of Hospitalization/Surgery cholecystectomy vaginal hysterectomy vascular surgery Family and Social History Cancer: No; Diabetes: No; Heart Disease: No; Hereditary Spherocytosis: No; Hypertension: No; Kidney  Disease: No; Lung Disease: No; Seizures: No; Stroke: Yes - Siblings; Thyroid Problems: No; Tuberculosis: No; Never smoker; Marital Status - Divorced; Alcohol Use: Never; Drug Use: No History; Caffeine Use: Never; Financial Concerns: No; Food, Clothing or Shelter Needs: No; Support System Lacking: No; Transportation Concerns: No Electronic Signature(s) Signed: 12/24/2021 4:05:21 PM By: Fredirick Maudlin MD FACS Entered By: Fredirick Maudlin on 12/24/2021 15:09:20 -------------------------------------------------------------------------------- SuperBill Details Patient Name: Date of Service: [name] 12/24/2021 Medical Record Number: [id number] Patient Account Number: [id number] Date of Birth/Sex: Treating RN: 1935-06-18 (86 y.o. F) Primary Care Provider: [name] Other Clinician: Referring Provider: Treating Provider/Extender: Carmie End in Treatment: 66 Diagnosis Coding ICD-10 Codes Code Description [phone] Pressure ulcer of left buttock, stage 4 E11.9 Type 2 diabetes mellitus without complications A76.811 Pressure ulcer of left hip, stage 3 L89.893 Pressure ulcer of other site, stage 3 Facility Procedures CPT4 Code: [id number] Description: 59741 - DEB SUBQ TISSUE 20 SQ CM/< ICD-10 Diagnosis Description L89.893 Pressure ulcer of other site, stage 3 L89.223 Pressure ulcer of left hip, stage 3 Modifier: Quantity: 1 CPT4 Code: [id number] I Description: 680 - DEBRIDE WOUND 1ST 20 SQ CM OR <  1 CD-10 Diagnosis Description L89.324 Pressure ulcer of left buttock, stage 4 Modifier: Quantity: Physician Procedures : CPT4 Code Description Modifier [id number] - WC PHYS LEVEL 4 - EST PT 25 ICD-10 Diagnosis Description L89.324 Pressure ulcer of left buttock, stage 4 L89.223 Pressure ulcer of left hip, stage 3 L89.893 Pressure ulcer of other site, stage 3 E11.9 Type  2 diabetes mellitus without complications Quantity: 1 : [id number] - WC PHYS SUBQ  TISS 20 SQ CM ICD-10 Diagnosis Description L89.893 Pressure ulcer of other site, stage 3 L89.223 Pressure ulcer of left hip, stage 3 Quantity: 1 : [id number] - WC PHYS DEBR WO ANESTH 20 SQ CM ICD-10 Diagnosis Description L89.324 Pressure ulcer of left buttock, stage 4 Quantity: 1 Electronic Signature(s) Signed: 12/24/2021 3:16:27 PM By: Fredirick Maudlin MD FACS Entered By: Fredirick Maudlin on 12/24/2021 15:16:27

## 2021-12-28 DIAGNOSIS — Z7984 Long term (current) use of oral hypoglycemic drugs: Secondary | ICD-10-CM | POA: Diagnosis not present

## 2021-12-28 DIAGNOSIS — L89320 Pressure ulcer of left buttock, unstageable: Secondary | ICD-10-CM | POA: Diagnosis not present

## 2021-12-28 DIAGNOSIS — F0390 Unspecified dementia without behavioral disturbance: Secondary | ICD-10-CM | POA: Diagnosis not present

## 2021-12-28 DIAGNOSIS — L89222 Pressure ulcer of left hip, stage 2: Secondary | ICD-10-CM | POA: Diagnosis not present

## 2021-12-28 DIAGNOSIS — L89892 Pressure ulcer of other site, stage 2: Secondary | ICD-10-CM | POA: Diagnosis not present

## 2021-12-28 DIAGNOSIS — E1140 Type 2 diabetes mellitus with diabetic neuropathy, unspecified: Secondary | ICD-10-CM | POA: Diagnosis not present

## 2022-01-08 DIAGNOSIS — L89320 Pressure ulcer of left buttock, unstageable: Secondary | ICD-10-CM | POA: Diagnosis not present

## 2022-01-08 DIAGNOSIS — F0390 Unspecified dementia without behavioral disturbance: Secondary | ICD-10-CM | POA: Diagnosis not present

## 2022-01-08 DIAGNOSIS — L89222 Pressure ulcer of left hip, stage 2: Secondary | ICD-10-CM | POA: Diagnosis not present

## 2022-01-08 DIAGNOSIS — Z7984 Long term (current) use of oral hypoglycemic drugs: Secondary | ICD-10-CM | POA: Diagnosis not present

## 2022-01-08 DIAGNOSIS — L89892 Pressure ulcer of other site, stage 2: Secondary | ICD-10-CM | POA: Diagnosis not present

## 2022-01-08 DIAGNOSIS — E1140 Type 2 diabetes mellitus with diabetic neuropathy, unspecified: Secondary | ICD-10-CM | POA: Diagnosis not present

## 2022-01-12 ENCOUNTER — Encounter (HOSPITAL_BASED_OUTPATIENT_CLINIC_OR_DEPARTMENT_OTHER): Payer: Medicare Other | Attending: General Surgery | Admitting: General Surgery

## 2022-01-12 DIAGNOSIS — L89223 Pressure ulcer of left hip, stage 3: Secondary | ICD-10-CM | POA: Diagnosis not present

## 2022-01-12 DIAGNOSIS — L89893 Pressure ulcer of other site, stage 3: Secondary | ICD-10-CM | POA: Insufficient documentation

## 2022-01-12 DIAGNOSIS — R32 Unspecified urinary incontinence: Secondary | ICD-10-CM | POA: Insufficient documentation

## 2022-01-12 DIAGNOSIS — L89892 Pressure ulcer of other site, stage 2: Secondary | ICD-10-CM | POA: Diagnosis not present

## 2022-01-12 DIAGNOSIS — L89324 Pressure ulcer of left buttock, stage 4: Secondary | ICD-10-CM | POA: Diagnosis not present

## 2022-01-12 DIAGNOSIS — E119 Type 2 diabetes mellitus without complications: Secondary | ICD-10-CM | POA: Insufficient documentation

## 2022-01-12 DIAGNOSIS — E11622 Type 2 diabetes mellitus with other skin ulcer: Secondary | ICD-10-CM | POA: Diagnosis not present

## 2022-01-12 DIAGNOSIS — F0390 Unspecified dementia without behavioral disturbance: Secondary | ICD-10-CM | POA: Diagnosis not present

## 2022-01-12 DIAGNOSIS — I10 Essential (primary) hypertension: Secondary | ICD-10-CM | POA: Insufficient documentation

## 2022-01-12 DIAGNOSIS — L89220 Pressure ulcer of left hip, unstageable: Secondary | ICD-10-CM | POA: Diagnosis not present

## 2022-01-12 NOTE — Progress Notes (Signed)
[name] ([id number]) , Visit Report for 01/12/2022 Chief Complaint Document Details Patient Name: Date of Service: [name], PERI 01/12/2022 9:30 A M Medical Record Number: [id number] Patient Account Number: [id number] Date of Birth/Sex: Treating RN: 09/04/1935 (86 y.o. F) Primary Care Provider: [name] Other Clinician: Referring Provider: Treating Provider/Extender: Carmie End in Treatment: 82 Information Obtained from: Patient Chief Complaint LEFT buttocks pressure ulcer Electronic Signature(s) Signed: 01/12/2022 10:26:28 AM By: Fredirick Maudlin MD FACS Entered By: Fredirick Maudlin on 01/12/2022 10:26:28 -------------------------------------------------------------------------------- Debridement Details Patient Name: Date of Service: [name] 01/12/2022 9:30 A M Medical Record Number: [id number] Patient Account Number: [id number] Date of Birth/Sex: Treating RN: 10-26-1935 (86 y.o. [name] Primary Care Provider: [name] Other Clinician: Referring Provider: Treating Provider/Extender: Carmie End in Treatment: 66 Debridement Performed for Assessment: Wound #6 Left Trochanter Performed By: Physician Fredirick Maudlin, MD Debridement Type: Debridement Level of Consciousness (Pre-procedure): Awake and Alert Pre-procedure Verification/Time Out Yes - 10:05 Taken: Start Time: 10:08 Pain Control: Lidocaine 5% topical ointment T Area Debrided (L x W): otal 2 (cm) x 2.1 (cm) = 4.2 (cm) Tissue and other material debrided: Non-Viable, Slough, Subcutaneous, Slough Level: Skin/Subcutaneous Tissue Debridement Description: Excisional Instrument: Curette Bleeding: Minimum Hemostasis Achieved: Pressure Procedural Pain: 0 Post Procedural Pain: 0 Response to Treatment: Procedure was tolerated well Level of Consciousness (Post- Awake and Alert procedure): Post Debridement Measurements of Total  Wound Length: (cm) 2 Stage: Unstageable/Unclassified Width: (cm) 2.1 Depth: (cm) 1 Volume: (cm) 3.299 Character of Wound/Ulcer Post Debridement: Improved Post Procedure Diagnosis Same as Pre-procedure Electronic Signature(s) Signed: 01/12/2022 11:31:48 AM By: Fredirick Maudlin MD FACS Signed: 01/12/2022 4:47:34 PM By: Sharyn Creamer RN, BSN Entered By: Sharyn Creamer on 01/12/2022 10:09:58 -------------------------------------------------------------------------------- Debridement Details Patient Name: Date of Service: [name] 01/12/2022 9:30 A M Medical Record Number: [id number] Patient Account Number: [id number] Date of Birth/Sex: Treating RN: October 14, 1935 (86 y.o. [name] Primary Care Provider: [name] Other Clinician: Referring Provider: Treating Provider/Extender: Carmie End in Treatment: 66 Debridement Performed for Assessment: Wound #4 Right,Proximal,Lateral Lower Leg Performed By: Physician Fredirick Maudlin, MD Debridement Type: Debridement Level of Consciousness (Pre-procedure): Awake and Alert Pre-procedure Verification/Time Out Yes - 10:05 Taken: Start Time: 10:08 Pain Control: Lidocaine 5% topical ointment T Area Debrided (L x W): otal 1.2 (cm) x 0.6 (cm) = 0.72 (cm) Tissue and other material debrided: Non-Viable, Eschar, Slough, Subcutaneous, Slough Level: Skin/Subcutaneous Tissue Debridement Description: Excisional Instrument: Curette Bleeding: Minimum Hemostasis Achieved: Pressure Procedural Pain: 0 Post Procedural Pain: 0 Response to Treatment: Procedure was tolerated well Level of Consciousness (Post- Awake and Alert procedure): Post Debridement Measurements of Total Wound Length: (cm) 1.2 Stage: Category/Stage II Width: (cm) 0.6 Depth: (cm) 0.2 Volume: (cm) 0.113 Character of Wound/Ulcer Post Debridement: Improved Post Procedure Diagnosis Same as Pre-procedure Electronic Signature(s) Signed:  01/12/2022 11:31:48 AM By: Fredirick Maudlin MD FACS Signed: 01/12/2022 4:47:34 PM By: Sharyn Creamer RN, BSN Entered By: Sharyn Creamer on 01/12/2022 10:11:36 -------------------------------------------------------------------------------- HPI Details Patient Name: Date of Service: [name] 01/12/2022 9:30 A M Medical Record Number: [id number] Patient Account Number: [id number] Date of Birth/Sex: Treating RN: Apr 03, 1936 (86 y.o. F) Primary Care Provider: [name] Other Clinician: Referring Provider: Treating Provider/Extender: Carmie End in Treatment: 8 History of Present Illness HPI Description: Admission 5/27 [name] is an 86 year old female with a past medical history of type 2 diabetes on oral agents, hypertension and dementia that presents today to  the clinic for a wound to her right buttocks. Her daughter-in-law is present today and helps provide the history. This issue has been going on for 2 to 3 weeks now. She states she developed an eschar about 1 week ago. She has been taking doxycycline for possible soft tissue infection to the area prescribed by her primary care provider. Patient currently denies any signs of infection. 6/3; patient admitted to the clinic last week. She has a large necrotic area on her left buttock. They use Santyl last week. On arrival this visit she had completely necrotic surface with open to subcutaneous tissue. The surface was completely nonviable. They have been using Santyl. She apparently had been on doxycycline which she is just finishing prescribed by her primary doctor for possible soft tissue infection 6/7; patient with a large necrotic wound over her left buttock. Aggressive debridement last week. Culture of this grew Proteus unfortunately would not of been covered well or at least predictably by the doxycycline that her primary doctor put her on. I have therefore put her on Augmentin suspension 3  times a day. We are using silver alginate as the primary dressing while we deal with the infection. Her daughter has a list of concerns including swallowing difficulties, concerns about aspiration. The patient has advanced dementia. If this is Alzheimer's disease it is at its preterminal stages. I went over that swallowing difficulties are part of what happens in the preterminal stages of Alzheimer's disease. Nevertheless we will family members seems to want to pursue a very aggressive course 6/21 large wound over her left buttock. She has completed the antibiotics I gave her [Augmentin]. We are using silver alginate while we are dealing with the infection and also to help with the drainage In general the wound looks better she has healthy granulation over perhaps 70% of it. Superiorly there is still necrotic debris I did not attempt to debride this today The patient has advanced dementia. I do not know that she could tolerate a wound VAC. She also has double incontinence which would make that difficult. Nevertheless she has been cared for diligently by her family. She is apparently eating well and may be 2 to 3 hours on this area all day 7/5; 2-week follow-up. She continues with a nice improvement in overall condition of the wound bed. The undermining area has still some adherent surface slough. We have been using silver alginate because of the drainage and possibility of at least superficial wound infection/colonization. Although I said in previous notes I wondered whether she could tolerate the wound VAC they have done so well with this wound at home I do not want to necessarily rule her out for a wound VAC and I am going to direct the start of a trial of wound VAC therapy if this can be covered by insurance, if we have home health support to change it at all 7/19; we did not get very far with the wound VAC because my original ICD 10 said this was unstageable. As usual this represents a  considerable frustration because we would not of ordered a wound VAC if the wound was still unstageable. This currently represents a stage IV staging. This is all the way down through muscle layer. There is a rim of tissue over the bone and no palpable exposed bone but this is a deep wound. Fortunately at present no evidence of infection. We have been using silver collagen with backing wet-to-dry. The wound is really no better today but no  worse 8/2; patient presents for 2-week follow-up. She has been using the wound VAC for the past week. There has been some issues with keeping the drape in place however home health comes out to reapply the drape when this happens. Overall there are no issues or complaints today. No signs of infection. 8/17; patient has been using the wound VAC on her wound on the upper left buttock. About 50 to 60% of this area is fully granulated however from about 10-5 o'clock there is still undermining. UNFORTUNATELY she also arrives in clinic today with a wound over her right fibular head. According to her daughter has been there for about 2 weeks. She wonders whether there is an abrasion although no concrete history of this. Given her frail status this is probably most likely a pressure ulcer over a bony prominence [fibular head] 8/31 unfortunately the upper left buttock pressure ulcer stage IV is not as optimistically better as I was hoping. The granulation that I identified on 12/23/20 is no longer adherent at the inferior pole the undermining is quite extensive but there is no palpable bone. No evidence of surrounding infection. There may be some improvement in the undermining measurements We are still dealing with the additional wound we identified 2 weeks ago on the right fibular head as well we have been using silver alginate here 9/14; 1 month follow-up. She has now had the wound VAC on for probably 2 months. No major change here if she still has the tunneling area at  12:00 and undermining from about 6-12. Surface of the wound does not look healthy. We use MolecuLight to look at the wound surface which actually looked negative however she has extensive immunofluorescence in the wound edge from about 12-6 o'clock this actually extends beyond the visible wound margin by quite amount. 9/27; no major change in the left buttock wound. There is still undermining however no exposed bone. Granulation looks reasonable. Right fibular head is much smaller We are using Hydrofera Blue on the right fibular head silver alginate on the buttock area 10/12; left buttock wound is large with undermining. I think this is about the same as last time granulation looks reasonable there is no exposed bone. The area on the fibular head is almost completely closed 11/30; left buttock wound large wound with undermining superiorly. As her daughter points out there is some change in the granulation superiorly and more darker red color also some raised areas of hypergranulation. I cannot tell that the area is infected. We are using silver alginate she is changing this daily On the right fibular head the area is just about closed 12/28; the patient's wound over her right fibular head is closed. Her left buttock ulcer looks actually a little better. Measurements are slightly improved. There is no exposed bone. The patient's daughter came in asking me about more aggressive options to attempt to get closure of the subcu wound including surgery. I thought we had mostly agreed that this would be a palliative type setting and that I did not expect this to actually close nevertheless I tried to answer her questions. I do not think the patient is a candidate for a myocutaneous flap. She is simply too frail and I doubt plastic surgery would consider her a viable candidate. She might be a candidate for an advanced treatment option like Oasis which sometimes can stimulate granulation to get these wounds  to gradually close. I told her in order for this to make sense she would need  probably a Foley catheter to control the incontinence and unfortunately weekly visits which would be difficult on this patient 1/25; 1 month follow-up. The patient has a large stage IV left buttock ulcer. Substantially deep but no exposed bone. She has advanced dementia. I do not think she is a candidate for anything aggressive and I follow her on a palliative basis. I been over this with her daughter many times. We have been using silver alginate backing ABDs. She has a level 3 surface on her mattress in spite of this she developed some degree of erythema today on the upper left pelvis 2/22; 1 month follow-up. Large left buttock pressure ulcer. We've been using silver alginate as a palliative dressing. Essentially deep wound with no exposed bone. No evidence of surrounding infection. This patient has advanced/preterminal dementia. Her daughter reports that he had a time where she was not eating very well although she seems to be improved lately. she does not appear to be systemically unwell 07/28/2021: 1 month follow-up. The overall wound dimensions are little bit smaller but she does have significant undermining and epibole. They have been using silver alginate in the wound. There is a small amount of slough buildup. The daughter-in-law that is with her today also expressed concern about some discoloration on the patient's left trochanter and right fibular head. 09/08/2021: 6-week follow-up. No significant change to the left buttock ulcer. There is a bit of slough buildup on the surface but it is otherwise clean without significant drainage or odor. The left trochanter looks like it may have partially broken down and then subsequently healed without intervention; there is some eschar overlying this and when I removed it, there was fresh pink tissue but no opening. On the right fibular head, this site has opened and there are  2 small wounds with a bit of slough accumulation. No odor or drainage. 10/20/2021: 6-week follow-up. No real change to the left buttock ulcer. The left trochanter is open now. It is fairly superficial but does extend into the fat layer. The right fibular head is clean and limited to the skin. She has a new area of tissue breakdown overlying her sacrum, just medial to her large left buttock ulcer. This looks like it is secondary to shear. 12/01/2021: 6-week follow-up. The left buttock ulcer remains unchanged. There is a light layer of slough and biofilm on the surface. The left trochanter has a heavy layer of eschar on it. The right fibular head wound is deeper today and has heavy slough accumulation. The area of tissue breakdown on her sacrum that was seen at her last visit has healed. 12/24/2021: The patient's daughter-in-law brought her to clinic today, earlier than her usual 6-week interval, because she had a lot more pain and drainage coming from her left trochanter. On inspection, the area is fluctuant and when pressure was applied, frank pus began draining from underneath the fibrinous eschar overlying the wound. The wound on her right fibular head is a little bit smaller but has some nonviable subcutaneous tissue underneath some slough. The sacral wound is unchanged with just a light layer of slough accumulation. 01/12/2022: The culture that I took from the wound on her left trochanter grew out a polymicrobial collection including Proteus mirabilis, strep, Enterococcus faecalis, Escherichia coli and multiple staph species. There was macrolide and tetracycline resistance. I prescribed a 10-day course of Augmentin and we have been applying topical gentamicin to the wound. She finished her oral antibiotics. Today, her sacral wound is unchanged. The  left trochanter wound is substantially cleaner. There is still some slough and nonviable subcutaneous tissue present. The wound on her right fibular head  has some dark fibrinous eschar that is fairly densely adherent. The home health nurse had recommended to the patient's daughter-in-law that she apply Santyl to this location. Electronic Signature(s) Signed: 01/12/2022 10:28:25 AM By: Fredirick Maudlin MD FACS Entered By: Fredirick Maudlin on 01/12/2022 10:28:25 -------------------------------------------------------------------------------- Physical Exam Details Patient Name: Date of Service: RANYIA, WITTING 01/12/2022 9:30 A M Medical Record Number: [id number] Patient Account Number: [id number] Date of Birth/Sex: Treating RN: [birth year] (86 y.o. F) Primary Care Provider: [name] Other Clinician: Referring Provider: Treating Provider/Extender: Carmie End in Treatment: 65 Constitutional Hypertensive, asymptomatic. . . . No acute distress.Marland Kitchen Respiratory Normal work of breathing on room air.. Notes 01/12/2022: Today, her sacral wound is unchanged. The left trochanter wound is substantially cleaner. There is still some slough and nonviable subcutaneous tissue present. The wound on her right fibular head has some dark fibrinous eschar that is fairly densely adherent. Electronic Signature(s) Signed: 01/12/2022 10:29:14 AM By: Fredirick Maudlin MD FACS Entered By: Fredirick Maudlin on 01/12/2022 10:29:13 -------------------------------------------------------------------------------- Physician Orders Details Patient Name: Date of Service: TAMBI, THOLE 01/12/2022 9:30 A M Medical Record Number: [id number] Patient Account Number: [id number] Date of Birth/Sex: Treating RN: [birth year] (86 y.o. [name] Primary Care Provider: [name] Other Clinician: Referring Provider: Treating Provider/Extender: Carmie End in Treatment: 32 Verbal / Phone Orders: No Diagnosis Coding ICD-10 Coding Code Description L89.324 Pressure ulcer of left buttock, stage 4 E11.9 Type 2 diabetes  mellitus without complications S50.539 Pressure ulcer of left hip, stage 3 L89.893 Pressure ulcer of other site, stage 3 Follow-up Appointments ppointment in 2 weeks. - Dr Celine Ahr Room 3 Return A 01/24/22 @ 10:15 Anesthetic (In clinic) Topical Lidocaine 5% applied to wound bed - In clinic Bathing/ Shower/ Hygiene May shower and wash wound with soap and water. - prior to dressing change Off-Loading Low air-loss mattress (Group 2) Turn and reposition every 2 hours Additional Orders / Instructions Follow Nutritious Diet - 100-120g of Protein Non Wound Condition Bilateral Lower Extremities Protect area with: - Bilateral Lateral Knees , and Right Trochanter. Protect with Silicone Foam Borders or ABD pads [facility] wound care orders this week; continue Home Health for wound care. May utilize formulary equivalent dressing for wound treatment orders unless otherwise specified. - Santyl to R leg Norva Karvonen and Silver alginate to L trochanter Silver Alginate to glute Other Home Health Orders/Instructions: - [id number] Wound Treatment Wound #2 - Gluteus Wound Laterality: Left Cleanser: Soap and Water Every Other Day/30 Days Discharge Instructions: May shower and wash wound with dial antibacterial soap and water prior to dressing change. Cleanser: Wound Cleanser Every Other Day/30 Days Discharge Instructions: Cleanse the wound with wound cleanser prior to applying a clean dressing using gauze sponges, not tissue or cotton balls. Prim Dressing: KerraCel Ag Gelling Fiber Dressing, 4x5 in (silver alginate) (DME) (Generic) Every Other Day/30 Days ary Discharge Instructions: Apply silver alginate to wound bed as instructed Secondary Dressing: Bordered Gauze, 4x4 in (Generic) Every Other Day/30 Days Discharge Instructions: Apply over primary dressing as directed. Wound #4 - Lower Leg Wound Laterality: Right, Lateral, Proximal Cleanser: Soap and Water Every Other Day/30 Days Discharge Instructions: May  shower and wash wound with dial antibacterial soap and water prior to dressing change. Cleanser: Wound Cleanser Every Other Day/30 Days Discharge Instructions: Cleanse the wound with wound cleanser prior to  applying a clean dressing using gauze sponges, not tissue or cotton balls. Prim Dressing: KerraCel Ag Gelling Fiber Dressing, 4x5 in (silver alginate) (Generic) Every Other Day/30 Days ary Discharge Instructions: Apply silver alginate to wound bed as instructed Prim Dressing: Santyl Ointment Every Other Day/30 Days ary Discharge Instructions: Apply nickel thick amount to wound bed as instructed Prim Dressing: Xeroform Gauze Dressing, 2x2 (in/in) HBD (Generic) Every Other Day/30 Days ary Secondary Dressing: Bordered Gauze, 4x4 in (Generic) Every Other Day/30 Days Discharge Instructions: Apply over primary dressing as directed. Wound #6 - Trochanter Wound Laterality: Left Cleanser: Soap and Water Every Other Day/30 Days Discharge Instructions: May shower and wash wound with dial antibacterial soap and water prior to dressing change. Cleanser: Wound Cleanser Every Other Day/30 Days Discharge Instructions: Cleanse the wound with wound cleanser prior to applying a clean dressing using gauze sponges, not tissue or cotton balls. Topical: Gentamicin Every Other Day/30 Days Discharge Instructions: As directed by physician Prim Dressing: KerraCel Ag Gelling Fiber Dressing, 2x2 in (silver alginate) (DME) (Generic) Every Other Day/30 Days ary Discharge Instructions: Apply silver alginate to wound bed as instructed Secondary Dressing: Bordered Gauze, 4x4 in (Generic) Every Other Day/30 Days Discharge Instructions: Apply over primary dressing as directed. Electronic Signature(s) Signed: 01/12/2022 11:31:48 AM By: Fredirick Maudlin MD FACS Signed: 01/12/2022 4:47:34 PM By: Sharyn Creamer RN, BSN Entered By: Sharyn Creamer on 01/12/2022  10:48:02 -------------------------------------------------------------------------------- Problem List Details Patient Name: Date of Service: SELESTE, TALLMAN 01/12/2022 9:30 A M Medical Record Number: [id number] Patient Account Number: [id number] Date of Birth/Sex: Treating RN: August 25, 1935 (86 y.o. [name] Primary Care Provider: [name] Other Clinician: Referring Provider: Treating Provider/Extender: Carmie End in Treatment: 63 Active Problems ICD-10 Encounter Code Description Active Date MDM Diagnosis L89.324 Pressure ulcer of left buttock, stage 4 11/24/2020 No Yes E11.9 Type 2 diabetes mellitus without complications 11/09/1285 No Yes L89.223 Pressure ulcer of left hip, stage 3 07/28/2021 No Yes L89.893 Pressure ulcer of other site, stage 3 07/28/2021 No Yes Inactive Problems ICD-10 Code Description Active Date Inactive Date L89.320 Pressure ulcer of left buttock, unstageable 10/13/2020 10/13/2020 L89.93 Pressure ulcer of unspecified site, stage 3 12/23/2020 12/23/2020 Resolved Problems Electronic Signature(s) Signed: 01/12/2022 10:26:11 AM By: Fredirick Maudlin MD FACS Entered By: Fredirick Maudlin on 01/12/2022 10:26:11 -------------------------------------------------------------------------------- Progress Note Details Patient Name: Date of Service: [name] 01/12/2022 9:30 A M Medical Record Number: [id number] Patient Account Number: [id number] Date of Birth/Sex: Treating RN: 17-Jan-1936 (86 y.o. F) Primary Care Provider: [name] Other Clinician: Referring Provider: Treating Provider/Extender: Carmie End in Treatment: 66 Subjective Chief Complaint Information obtained from Patient LEFT buttocks pressure ulcer History of Present Illness (HPI) Admission 5/27 [name] is an 86 year old female with a past medical history of type 2 diabetes on oral agents, hypertension and dementia that  presents today to the clinic for a wound to her right buttocks. Her daughter-in-law is present today and helps provide the history. This issue has been going on for 2 to 3 weeks now. She states she developed an eschar about 1 week ago. She has been taking doxycycline for possible soft tissue infection to the area prescribed by her primary care provider. Patient currently denies any signs of infection. 6/3; patient admitted to the clinic last week. She has a large necrotic area on her left buttock. They use Santyl last week. On arrival this visit she had completely necrotic surface with open to subcutaneous tissue. The surface was completely  nonviable. They have been using Santyl. She apparently had been on doxycycline which she is just finishing prescribed by her primary doctor for possible soft tissue infection 6/7; patient with a large necrotic wound over her left buttock. Aggressive debridement last week. Culture of this grew Proteus unfortunately would not of been covered well or at least predictably by the doxycycline that her primary doctor put her on. I have therefore put her on Augmentin suspension 3 times a day. We are using silver alginate as the primary dressing while we deal with the infection. Her daughter has a list of concerns including swallowing difficulties, concerns about aspiration. The patient has advanced dementia. If this is Alzheimer's disease it is at its preterminal stages. I went over that swallowing difficulties are part of what happens in the preterminal stages of Alzheimer's disease. Nevertheless we will family members seems to want to pursue a very aggressive course 6/21 large wound over her left buttock. She has completed the antibiotics I gave her [Augmentin]. We are using silver alginate while we are dealing with the infection and also to help with the drainage In general the wound looks better she has healthy granulation over perhaps 70% of it. Superiorly there is  still necrotic debris I did not attempt to debride this today The patient has advanced dementia. I do not know that she could tolerate a wound VAC. She also has double incontinence which would make that difficult. Nevertheless she has been cared for diligently by her family. She is apparently eating well and may be 2 to 3 hours on this area all day 7/5; 2-week follow-up. She continues with a nice improvement in overall condition of the wound bed. The undermining area has still some adherent surface slough. We have been using silver alginate because of the drainage and possibility of at least superficial wound infection/colonization. Although I said in previous notes I wondered whether she could tolerate the wound VAC they have done so well with this wound at home I do not want to necessarily rule her out for a wound VAC and I am going to direct the start of a trial of wound VAC therapy if this can be covered by insurance, if we have home health support to change it at all 7/19; we did not get very far with the wound VAC because my original ICD 10 said this was unstageable. As usual this represents a considerable frustration because we would not of ordered a wound VAC if the wound was still unstageable. This currently represents a stage IV staging. This is all the way down through muscle layer. There is a rim of tissue over the bone and no palpable exposed bone but this is a deep wound. Fortunately at present no evidence of infection. We have been using silver collagen with backing wet-to-dry. The wound is really no better today but no worse 8/2; patient presents for 2-week follow-up. She has been using the wound VAC for the past week. There has been some issues with keeping the drape in place however home health comes out to reapply the drape when this happens. Overall there are no issues or complaints today. No signs of infection. 8/17; patient has been using the wound VAC on her wound on the upper  left buttock. About 50 to 60% of this area is fully granulated however from about 10-5 o'clock there is still undermining. UNFORTUNATELY she also arrives in clinic today with a wound over her right fibular head. According to her daughter has  been there for about 2 weeks. She wonders whether there is an abrasion although no concrete history of this. Given her frail status this is probably most likely a pressure ulcer over a bony prominence [fibular head] 8/31 unfortunately the upper left buttock pressure ulcer stage IV is not as optimistically better as I was hoping. The granulation that I identified on 12/23/20 is no longer adherent at the inferior pole the undermining is quite extensive but there is no palpable bone. No evidence of surrounding infection. There may be some improvement in the undermining measurements We are still dealing with the additional wound we identified 2 weeks ago on the right fibular head as well we have been using silver alginate here 9/14; 1 month follow-up. She has now had the wound VAC on for probably 2 months. No major change here if she still has the tunneling area at 12:00 and undermining from about 6-12. Surface of the wound does not look healthy. We use MolecuLight to look at the wound surface which actually looked negative however she has extensive immunofluorescence in the wound edge from about 12-6 o'clock this actually extends beyond the visible wound margin by quite amount. 9/27; no major change in the left buttock wound. There is still undermining however no exposed bone. Granulation looks reasonable. oo Right fibular head is much smaller We are using Hydrofera Blue on the right fibular head silver alginate on the buttock area 10/12; left buttock wound is large with undermining. I think this is about the same as last time granulation looks reasonable there is no exposed bone. The area on the fibular head is almost completely closed 11/30; left buttock wound  large wound with undermining superiorly. As her daughter points out there is some change in the granulation superiorly and more darker red color also some raised areas of hypergranulation. I cannot tell that the area is infected. We are using silver alginate she is changing this daily On the right fibular head the area is just about closed 12/28; the patient's wound over her right fibular head is closed. Her left buttock ulcer looks actually a little better. Measurements are slightly improved. There is no exposed bone. The patient's daughter came in asking me about more aggressive options to attempt to get closure of the subcu wound including surgery. I thought we had mostly agreed that this would be a palliative type setting and that I did not expect this to actually close nevertheless I tried to answer her questions. I do not think the patient is a candidate for a myocutaneous flap. She is simply too frail and I doubt plastic surgery would consider her a viable candidate. She might be a candidate for an advanced treatment option like Oasis which sometimes can stimulate granulation to get these wounds to gradually close. I told her in order for this to make sense she would need probably a Foley catheter to control the incontinence and unfortunately weekly visits which would be difficult on this patient 1/25; 1 month follow-up. The patient has a large stage IV left buttock ulcer. Substantially deep but no exposed bone. She has advanced dementia. I do not think she is a candidate for anything aggressive and I follow her on a palliative basis. I been over this with her daughter many times. We have been using silver alginate backing ABDs. She has a level 3 surface on her mattress in spite of this she developed some degree of erythema today on the upper left pelvis 2/22; 1 month follow-up.  Large left buttock pressure ulcer. We've been using silver alginate as a palliative dressing. Essentially deep wound  with no exposed bone. No evidence of surrounding infection. This patient has advanced/preterminal dementia. Her daughter reports that he had a time where she was not eating very well although she seems to be improved lately. she does not appear to be systemically unwell 07/28/2021: 1 month follow-up. The overall wound dimensions are little bit smaller but she does have significant undermining and epibole. They have been using silver alginate in the wound. There is a small amount of slough buildup. The daughter-in-law that is with her today also expressed concern about some discoloration on the patient's left trochanter and right fibular head. 09/08/2021: 6-week follow-up. No significant change to the left buttock ulcer. There is a bit of slough buildup on the surface but it is otherwise clean without significant drainage or odor. The left trochanter looks like it may have partially broken down and then subsequently healed without intervention; there is some eschar overlying this and when I removed it, there was fresh pink tissue but no opening. On the right fibular head, this site has opened and there are 2 small wounds with a bit of slough accumulation. No odor or drainage. 10/20/2021: 6-week follow-up. No real change to the left buttock ulcer. The left trochanter is open now. It is fairly superficial but does extend into the fat layer. The right fibular head is clean and limited to the skin. She has a new area of tissue breakdown overlying her sacrum, just medial to her large left buttock ulcer. This looks like it is secondary to shear. 12/01/2021: 6-week follow-up. The left buttock ulcer remains unchanged. There is a light layer of slough and biofilm on the surface. The left trochanter has a heavy layer of eschar on it. The right fibular head wound is deeper today and has heavy slough accumulation. The area of tissue breakdown on her sacrum that was seen at her last visit has healed. 12/24/2021: The  patient's daughter-in-law brought her to clinic today, earlier than her usual 6-week interval, because she had a lot more pain and drainage coming from her left trochanter. On inspection, the area is fluctuant and when pressure was applied, frank pus began draining from underneath the fibrinous eschar overlying the wound. The wound on her right fibular head is a little bit smaller but has some nonviable subcutaneous tissue underneath some slough. The sacral wound is unchanged with just a light layer of slough accumulation. 01/12/2022: The culture that I took from the wound on her left trochanter grew out a polymicrobial collection including Proteus mirabilis, strep, Enterococcus faecalis, Escherichia coli and multiple staph species. There was macrolide and tetracycline resistance. I prescribed a 10-day course of Augmentin and we have been applying topical gentamicin to the wound. She finished her oral antibiotics. Today, her sacral wound is unchanged. The left trochanter wound is substantially cleaner. There is still some slough and nonviable subcutaneous tissue present. The wound on her right fibular head has some dark fibrinous eschar that is fairly densely adherent. The home health nurse had recommended to the patient's daughter-in-law that she apply Santyl to this location. Patient History Unable to Obtain Patient History due to Dementia. Information obtained from Patient. Family History Stroke - Siblings, No family history of Cancer, Diabetes, Heart Disease, Hereditary Spherocytosis, Hypertension, Kidney Disease, Lung Disease, Seizures, Thyroid Problems, Tuberculosis. Social History Never smoker, Marital Status - Divorced, Alcohol Use - Never, Drug Use - No History, Caffeine Use -  Never. Medical History Eyes Denies history of Cataracts, Glaucoma, Optic Neuritis Ear/Nose/Mouth/Throat Denies history of Chronic sinus problems/congestion, Middle ear problems Hematologic/Lymphatic Patient has  history of Anemia Denies history of Hemophilia, Human Immunodeficiency Virus, Lymphedema, Sickle Cell Disease Respiratory Denies history of Aspiration, Asthma, Chronic Obstructive Pulmonary Disease (COPD), Pneumothorax, Sleep Apnea, Tuberculosis Cardiovascular Patient has history of Deep Vein Thrombosis, Hypertension Denies history of Angina, Arrhythmia, Congestive Heart Failure, Coronary Artery Disease, Hypotension, Myocardial Infarction, Peripheral Arterial Disease, Peripheral Venous Disease, Phlebitis, Vasculitis Gastrointestinal Denies history of Cirrhosis , Colitis, Crohnoos, Hepatitis A, Hepatitis B, Hepatitis C Endocrine Patient has history of Type II Diabetes Denies history of Type I Diabetes Genitourinary Denies history of End Stage Renal Disease Immunological Denies history of Lupus Erythematosus, Raynaudoos, Scleroderma Integumentary (Skin) Denies history of History of Burn Musculoskeletal Denies history of Gout, Rheumatoid Arthritis, Osteoarthritis, Osteomyelitis Neurologic Patient has history of Dementia Denies history of Neuropathy, Quadriplegia, Paraplegia, Seizure Disorder Oncologic Denies history of Received Chemotherapy, Received Radiation Psychiatric Denies history of Anorexia/bulimia, Confinement Anxiety Hospitalization/Surgery History - cholecystectomy. - vaginal hysterectomy. - vascular surgery. Medical A Surgical History Notes nd Respiratory hx PE Cardiovascular atrial septal aneurysm, hyperlipidemia Genitourinary recent UTi Neurologic CVA Objective Constitutional Hypertensive, asymptomatic. No acute distress.. Vitals Time Taken: 9:40 AM, Height: 68 in, Weight: 110 lbs, BMI: 16.7, Temperature: 97.4 F, Pulse: 76 bpm, Respiratory Rate: 14 breaths/min, Blood Pressure: 209/91 mmHg. General Notes: daughter state pt pressure may be up due to movement this morning, states she is not concerned about pressure, md notified. Respiratory Normal work of  breathing on room air.. General Notes: 01/12/2022: Today, her sacral wound is unchanged. The left trochanter wound is substantially cleaner. There is still some slough and nonviable subcutaneous tissue present. The wound on her right fibular head has some dark fibrinous eschar that is fairly densely adherent. Integumentary (Hair, Skin) Wound #2 status is Open. Original cause of wound was Pressure Injury. The date acquired was: 09/18/2020. The wound has been in treatment 66 weeks. The wound is located on the Left Gluteus. The wound measures 4.8cm length x 2.2cm width x 2cm depth; 8.294cm^2 area and 16.588cm^3 volume. There is Fat Layer (Subcutaneous Tissue) exposed. There is no tunneling noted, however, there is undermining starting at 12:00 and ending at 12:00 with a maximum distance of 2cm. There is a medium amount of serosanguineous drainage noted. The wound margin is epibole. There is medium (34-66%) red, pink granulation within the wound bed. There is a medium (34-66%) amount of necrotic tissue within the wound bed including Adherent Slough. Wound #4 status is Open. Original cause of wound was Trauma. The date acquired was: 09/08/2021. The wound has been in treatment 18 weeks. The wound is located on the Right,Proximal,Lateral Lower Leg. The wound measures 1.2cm length x 0.6cm width x 0.2cm depth; 0.565cm^2 area and 0.113cm^3 volume. There is Fat Layer (Subcutaneous Tissue) exposed. There is no tunneling or undermining noted. There is a medium amount of serosanguineous drainage noted. The wound margin is distinct with the outline attached to the wound base. There is small (1-33%) pink granulation within the wound bed. There is a large (67- 100%) amount of necrotic tissue within the wound bed including Adherent Slough. Wound #6 status is Open. Original cause of wound was Pressure Injury. The date acquired was: 09/17/2021. The wound has been in treatment 12 weeks. The wound is located on the Left  Trochanter. The wound measures 2cm length x 2.1cm width x 1cm depth; 3.299cm^2 area and 3.299cm^3 volume. There is no  tunneling noted, however, there is undermining starting at 3:00 and ending at 6:00 with a maximum distance of 0.5cm. There is a medium amount of serosanguineous drainage noted. The wound margin is flat and intact. There is small (1-33%) pink granulation within the wound bed. There is a large (67-100%) amount of necrotic tissue within the wound bed including Eschar and Adherent Slough. Assessment Active Problems ICD-10 Pressure ulcer of left buttock, stage 4 Type 2 diabetes mellitus without complications Pressure ulcer of left hip, stage 3 Pressure ulcer of other site, stage 3 Procedures Wound #4 Pre-procedure diagnosis of Wound #4 is a Pressure Ulcer located on the Right,Proximal,Lateral Lower Leg . There was a Excisional Skin/Subcutaneous Tissue Debridement with a total area of 0.72 sq cm performed by Fredirick Maudlin, MD. With the following instrument(s): Curette to remove Non-Viable tissue/material. Material removed includes Eschar, Subcutaneous Tissue, and Slough after achieving pain control using Lidocaine 5% topical ointment. No specimens were taken. A time out was conducted at 10:05, prior to the start of the procedure. A Minimum amount of bleeding was controlled with Pressure. The procedure was tolerated well with a pain level of 0 throughout and a pain level of 0 following the procedure. Post Debridement Measurements: 1.2cm length x 0.6cm width x 0.2cm depth; 0.113cm^3 volume. Post debridement Stage noted as Category/Stage II. Character of Wound/Ulcer Post Debridement is improved. Post procedure Diagnosis Wound #4: Same as Pre-Procedure Wound #6 Pre-procedure diagnosis of Wound #6 is a Pressure Ulcer located on the Left Trochanter . There was a Excisional Skin/Subcutaneous Tissue Debridement with a total area of 4.2 sq cm performed by Fredirick Maudlin, MD. With the  following instrument(s): Curette to remove Non-Viable tissue/material. Material removed includes Subcutaneous Tissue and Slough and after achieving pain control using Lidocaine 5% topical ointment. No specimens were taken. A time out was conducted at 10:05, prior to the start of the procedure. A Minimum amount of bleeding was controlled with Pressure. The procedure was tolerated well with a pain level of 0 throughout and a pain level of 0 following the procedure. Post Debridement Measurements: 2cm length x 2.1cm width x 1cm depth; 3.299cm^3 volume. Post debridement Stage noted as Unstageable/Unclassified. Character of Wound/Ulcer Post Debridement is improved. Post procedure Diagnosis Wound #6: Same as Pre-Procedure Plan Follow-up Appointments: Return Appointment in 2 weeks. - Dr Celine Ahr Room 3 01/24/22 @ 10:15 Anesthetic: (In clinic) Topical Lidocaine 5% applied to wound bed - In clinic Bathing/ Shower/ Hygiene: May shower and wash wound with soap and water. - prior to dressing change Off-Loading: Low air-loss mattress (Group 2) Turn and reposition every 2 hours Additional Orders / Instructions: Follow Nutritious Diet - 100-120g of Protein Non Wound Condition: Protect area with: - Bilateral Lateral Knees , and Right Trochanter. Protect with Silicone Foam Borders or ABD pads Home Health: New wound care orders this week; continue Home Health for wound care. May utilize formulary equivalent dressing for wound treatment orders unless otherwise specified. - Santyl to R leg Norva Karvonen and Silver alginate to L trochanter Silver Alginate to glute Other Home Health Orders/Instructions: - IEPPIRJ WOUND #2: - Gluteus Wound Laterality: Left Cleanser: Soap and Water Every Other Day/30 Days Discharge Instructions: May shower and wash wound with dial antibacterial soap and water prior to dressing change. Cleanser: Wound Cleanser Every Other Day/30 Days Discharge Instructions: Cleanse the wound with wound  cleanser prior to applying a clean dressing using gauze sponges, not tissue or cotton balls. Prim Dressing: KerraCel Ag Gelling Fiber Dressing, 4x5 in (silver alginate) (  Generic) Every Other Day/30 Days ary Discharge Instructions: Apply silver alginate to wound bed as instructed Secondary Dressing: Bordered Gauze, 4x4 in (Generic) Every Other Day/30 Days Discharge Instructions: Apply over primary dressing as directed. WOUND #4: - Lower Leg Wound Laterality: Right, Lateral, Proximal Cleanser: Soap and Water Every Other Day/30 Days Discharge Instructions: May shower and wash wound with dial antibacterial soap and water prior to dressing change. Cleanser: Wound Cleanser Every Other Day/30 Days Discharge Instructions: Cleanse the wound with wound cleanser prior to applying a clean dressing using gauze sponges, not tissue or cotton balls. Prim Dressing: KerraCel Ag Gelling Fiber Dressing, 4x5 in (silver alginate) (Generic) Every Other Day/30 Days ary Discharge Instructions: Apply silver alginate to wound bed as instructed Prim Dressing: Santyl Ointment Every Other Day/30 Days ary Discharge Instructions: Apply nickel thick amount to wound bed as instructed Prim Dressing: Xeroform Gauze Dressing, 2x2 (in/in) HBD (Generic) Every Other Day/30 Days ary Secondary Dressing: Bordered Gauze, 4x4 in (Generic) Every Other Day/30 Days Discharge Instructions: Apply over primary dressing as directed. WOUND #6: - Trochanter Wound Laterality: Left Cleanser: Soap and Water Every Other Day/30 Days Discharge Instructions: May shower and wash wound with dial antibacterial soap and water prior to dressing change. Cleanser: Wound Cleanser Every Other Day/30 Days Discharge Instructions: Cleanse the wound with wound cleanser prior to applying a clean dressing using gauze sponges, not tissue or cotton balls. Topical: Gentamicin Every Other Day/30 Days Discharge Instructions: As directed by physician Prim Dressing:  KerraCel Ag Gelling Fiber Dressing, 2x2 in (silver alginate) Every Other Day/30 Days ary Discharge Instructions: Apply silver alginate to wound bed as instructed Secondary Dressing: Bordered Gauze, 4x4 in (Generic) Every Other Day/30 Days Discharge Instructions: Apply over primary dressing as directed. 01/12/2022: Today, her sacral wound is unchanged. The left trochanter wound is substantially cleaner. There is still some slough and nonviable subcutaneous tissue present. The wound on her right fibular head has some dark fibrinous eschar that is fairly densely adherent. I used a curette to debride slough and nonviable subcutaneous tissue from both of the lower extremity wounds. The sacral wound did not require debridement. We will continue to apply topical gentamicin with silver alginate to the left trochanter wound, Santyl to the right fibular head wound with either silver alginate or plain gauze overlying it and silver alginate to the sacral wound. I would like to see them back in 2 weeks. Electronic Signature(s) Signed: 01/12/2022 10:30:21 AM By: Fredirick Maudlin MD FACS Signed: 01/12/2022 10:30:21 AM By: Fredirick Maudlin MD FACS Entered By: Fredirick Maudlin on 01/12/2022 10:30:21 -------------------------------------------------------------------------------- HxROS Details Patient Name: Date of Service: [name], [name] 01/12/2022 9:30 A M Medical Record Number: [id number] Patient Account Number: [id number] Date of Birth/Sex: Treating RN: 12/24/1935 (86 y.o. F) Primary Care Provider: [name] Other Clinician: Referring Provider: Treating Provider/Extender: Carmie End in Treatment: 45 Unable to Obtain Patient History due to Dementia Information Obtained From Patient Eyes Medical History: Negative for: Cataracts; Glaucoma; Optic Neuritis Ear/Nose/Mouth/Throat Medical History: Negative for: Chronic sinus problems/congestion; Middle ear  problems Hematologic/Lymphatic Medical History: Positive for: Anemia Negative for: Hemophilia; Human Immunodeficiency Virus; Lymphedema; Sickle Cell Disease Respiratory Medical History: Negative for: Aspiration; Asthma; Chronic Obstructive Pulmonary Disease (COPD); Pneumothorax; Sleep Apnea; Tuberculosis Past Medical History Notes: hx PE Cardiovascular Medical History: Positive for: Deep Vein Thrombosis; Hypertension Negative for: Angina; Arrhythmia; Congestive Heart Failure; Coronary Artery Disease; Hypotension; Myocardial Infarction; Peripheral Arterial Disease; Peripheral Venous Disease; Phlebitis; Vasculitis Past Medical History Notes: atrial septal aneurysm, hyperlipidemia Gastrointestinal  Medical History: Negative for: Cirrhosis ; Colitis; Crohns; Hepatitis A; Hepatitis B; Hepatitis C Endocrine Medical History: Positive for: Type II Diabetes Negative for: Type I Diabetes Time with diabetes: 15 years Treated with: Oral agents Blood sugar tested every day: Yes Tested : 2 times per day Genitourinary Medical History: Negative for: End Stage Renal Disease Past Medical History Notes: recent UTi Immunological Medical History: Negative for: Lupus Erythematosus; Raynauds; Scleroderma Integumentary (Skin) Medical History: Negative for: History of Burn Musculoskeletal Medical History: Negative for: Gout; Rheumatoid Arthritis; Osteoarthritis; Osteomyelitis Neurologic Medical History: Positive for: Dementia Negative for: Neuropathy; Quadriplegia; Paraplegia; Seizure Disorder Past Medical History Notes: CVA Oncologic Medical History: Negative for: Received Chemotherapy; Received Radiation Psychiatric Medical History: Negative for: Anorexia/bulimia; Confinement Anxiety Immunizations Pneumococcal Vaccine: Received Pneumococcal Vaccination: No Implantable Devices None Hospitalization / Surgery History Type of Hospitalization/Surgery cholecystectomy vaginal  hysterectomy vascular surgery Family and Social History Cancer: No; Diabetes: No; Heart Disease: No; Hereditary Spherocytosis: No; Hypertension: No; Kidney Disease: No; Lung Disease: No; Seizures: No; Stroke: Yes - Siblings; Thyroid Problems: No; Tuberculosis: No; Never smoker; Marital Status - Divorced; Alcohol Use: Never; Drug Use: No History; Caffeine Use: Never; Financial Concerns: No; Food, Clothing or Shelter Needs: No; Support System Lacking: No; Transportation Concerns: No Electronic Signature(s) Signed: 01/12/2022 11:31:48 AM By: Fredirick Maudlin MD FACS Entered By: Fredirick Maudlin on 01/12/2022 10:28:33 -------------------------------------------------------------------------------- SuperBill Details Patient Name: Date of Service: [name] 01/12/2022 Medical Record Number: [id number] Patient Account Number: [id number] Date of Birth/Sex: Treating RN: 1935-08-19 (86 y.o. F) Primary Care Provider: [name] Other Clinician: Referring Provider: Treating Provider/Extender: Carmie End in Treatment: 66 Diagnosis Coding ICD-10 Codes Code Description [phone] Pressure ulcer of left buttock, stage 4 E11.9 Type 2 diabetes mellitus without complications Y65.993 Pressure ulcer of left hip, stage 3 L89.893 Pressure ulcer of other site, stage 3 Facility Procedures CPT4 Code: [id number] Description: 90300 - DEB SUBQ TISSUE 20 SQ CM/< ICD-10 Diagnosis Description L89.223 Pressure ulcer of left hip, stage 3 L89.893 Pressure ulcer of other site, stage 3 Modifier: Quantity: 1 Physician Procedures : CPT4 Code Description Modifier [id number] 62263 - WC PHYS LEVEL 4 - EST PT 25 ICD-10 Diagnosis Description L89.324 Pressure ulcer of left buttock, stage 4 L89.223 Pressure ulcer of left hip, stage 3 L89.893 Pressure ulcer of other site, stage 3 E11.9 Type  2 diabetes mellitus without complications Quantity: 1 : [id number] 11042 - WC PHYS SUBQ TISS 20 SQ CM ICD-10  Diagnosis Description L89.223 Pressure ulcer of left hip, stage 3 L89.893 Pressure ulcer of other site, stage 3 Quantity: 1 Electronic Signature(s) Signed: 01/12/2022 10:32:22 AM By: Fredirick Maudlin MD FACS Entered By: Fredirick Maudlin on 01/12/2022 10:32:21

## 2022-01-12 NOTE — Progress Notes (Signed)
[name] ([id number]) , Visit Report for 01/12/2022 Arrival Information Details Patient Name: Date of Service: [name], [name] 01/12/2022 9:30 A M Medical Record Number: [id number] Patient Account Number: [id number] Date of Birth/Sex: Treating RN: [birth year][name] (86 y.o. [name]: [name] Other Clinician: Referring Giovanie Lefebre: Treating Shamya Macfadden/Extender: Carmie End in Treatment: 21 Visit Information History Since Last Visit Added or deleted any medications: No Patient Arrived: Wheel Chair Any new allergies or adverse reactions: No Arrival Time: 09:39 Had a fall or experienced change in No Accompanied By: daughter activities of daily living that may affect Transfer Assistance: Manual risk of falls: Patient Identification Verified: Yes Signs or symptoms of abuse/neglect since last visito No Secondary Verification Process Completed: Yes Hospitalized since last visit: No Patient Requires Transmission-Based Precautions: No Implantable device outside of the clinic excluding No Patient Has Alerts: No cellular tissue based products placed in the center since last visit: Has Dressing in Place as Prescribed: Yes Has Compression in Place as Prescribed: Yes Pain Present Now: No Electronic Signature(s) Signed: 01/12/2022 4:47:34 PM By: Sharyn Creamer RN, BSN Entered By: Sharyn Creamer on 01/12/2022 09:40:40 -------------------------------------------------------------------------------- Encounter Discharge Information Details Patient Name: Date of Service: [name] 01/12/2022 9:30 [place] Record Number: [id number] Patient Account Number: [id number] Date of Birth/Sex: Treating RN: [birth year], [name] (86 y.o. [name] Primary Care Tyrika Newman: [name] Other Clinician: Referring Nawal Burling: Treating Laquana Villari/Extender: Carmie End in Treatment: 51 Encounter Discharge Information Items  Post Procedure Vitals Discharge Condition: Stable Temperature (F): 97.4 Ambulatory Status: Wheelchair Pulse (bpm): 76 Discharge Destination: Home Respiratory Rate (breaths/min): 14 Transportation: Private Auto Blood Pressure (mmHg): 209/91 Accompanied By: daughter Schedule Follow-up Appointment: Yes Clinical Summary of Care: Patient Declined Electronic Signature(s) Signed: 01/12/2022 4:47:34 PM By: Sharyn Creamer RN, BSN Entered By: Sharyn Creamer on 01/12/2022 10:23:53 -------------------------------------------------------------------------------- Lower Extremity Assessment Details Patient Name: Date of Service: [name], [name] 01/12/2022 9:30 A M Medical Record Number: [id number] Patient Account Number: [id number] Date of Birth/Sex: Treating RN: [name][birth year] (86 y.o. [name] Primary Care Hawkins Seaman: [name] Other Clinician: Referring Omair Dettmer: Treating Darsha Zumstein/Extender: Carmie End in Treatment: 69 Electronic Signature(s) Signed: 01/12/2022 4:47:34 PM By: Sharyn Creamer RN, BSN Entered By: Sharyn Creamer on 01/12/2022 09:46:48 -------------------------------------------------------------------------------- Multi Wound Chart Details Patient Name: Date of Service: [name] 01/12/2022 9:30 A M Medical Record Number: [id number] Patient Account Number: [id number] Date of Birth/Sex: Treating RN: 11/[birth year]/[name] (86 y.o. F) Primary Care Arlyn Bumpus: [name] Other Clinician: Referring Mekiah Cambridge: Treating Kolt Mcwhirter/Extender: Carmie End in Treatment: 84 Vital Signs Height(in): 71 Pulse(bpm): 90 Weight(lbs): 110 Blood Pressure(mmHg): 209/91 Body Mass Index(BMI): 16.7 Temperature(F): 97.4 Respiratory Rate(breaths/min): 14 Photos: Left Gluteus Right, Proximal, Lateral Lower Leg Left Trochanter Wound Location: Pressure Injury Trauma Pressure Injury Wounding Event: Pressure Ulcer Pressure Ulcer  Pressure Ulcer Primary Etiology: Anemia, Deep Vein Thrombosis, Anemia, Deep Vein Thrombosis, Anemia, Deep Vein Thrombosis, Comorbid History: Hypertension, Type II Diabetes, Hypertension, Type II Diabetes, Hypertension, Type II Diabetes, Dementia Dementia Dementia 09/18/2020 09/08/2021 5/Contreras/2023 Date Acquired: 57 18 Contreras Weeks of Treatment: Open Open Open Wound Status: No No No Wound Recurrence: 4.8x2.2x2 1.2x0.6x0.2 2x2.1x1 Measurements L x W x D (cm) 8.294 0.565 3.299 A (cm) : rea 16.588 0.113 3.299 Volume (cm) : 80.10% -378.80% -10541.90% % Reduction in A rea: -298.20% -841.70% -109866.70% % Reduction in Volume: Contreras 3 Starting Position 1 (o'clock): Contreras 6 Ending Position 1 (o'clock): 2 0.5 Maximum Distance 1 (cm): Yes No  Yes Undermining: Category/Stage III Category/Stage II Unstageable/Unclassified Classification: Medium Medium Medium Exudate A mount: Serosanguineous Serosanguineous Serosanguineous Exudate Type: red, brown red, brown red, brown Exudate Color: Epibole Distinct, outline attached Flat and Intact Wound Margin: Medium (34-66%) Small (1-33%) Small (1-33%) Granulation A mount: Red, Pink Pink Pink Granulation Quality: Medium (34-66%) Large (67-100%) Large (67-100%) Necrotic Amount: Adherent Slough Adherent Slough Eschar, Adherent Slough Necrotic Tissue: Fat Layer (Subcutaneous Tissue): Yes Fat Layer (Subcutaneous Tissue): Yes Fascia: No Exposed Structures: Fascia: No Fascia: No Fat Layer (Subcutaneous Tissue): No Tendon: No Tendon: No Tendon: No Muscle: No Muscle: No Muscle: No Joint: No Joint: No Joint: No Bone: No Bone: No Bone: No Small (1-33%) Small (1-33%) Small (1-33%) Epithelialization: N/A Debridement - Excisional Debridement - Excisional Debridement: Pre-procedure Verification/Time Out N/A 10:05 10:05 Taken: N/A Lidocaine 5% topical ointment Lidocaine 5% topical ointment Pain Control: N/A Necrotic/Eschar, Subcutaneous,  Subcutaneous, Slough Tissue Debrided: Slough N/A Skin/Subcutaneous Tissue Skin/Subcutaneous Tissue Level: N/A 0.72 4.2 Debridement A (sq cm): rea N/A Curette Curette Instrument: N/A Minimum Minimum Bleeding: N/A Pressure Pressure Hemostasis Achieved: N/A 0 0 Procedural Pain: N/A 0 0 Post Procedural Pain: N/A Procedure was tolerated well Procedure was tolerated well Debridement Treatment Response: N/A 1.2x0.6x0.2 2x2.1x1 Post Debridement Measurements L x W x D (cm) N/A 0.113 3.299 Post Debridement Volume: (cm) N/A Category/Stage II Unstageable/Unclassified Post Debridement Stage: N/A Debridement Debridement Procedures Performed: Treatment Notes Wound #2 (Gluteus) Wound Laterality: Left Cleanser Soap and Water Discharge Instruction: May shower and wash wound with dial antibacterial soap and water prior to dressing change. Wound Cleanser Discharge Instruction: Cleanse the wound with wound cleanser prior to applying a clean dressing using gauze sponges, not tissue or cotton balls. Peri-Wound Care Topical Primary Dressing KerraCel Ag Gelling Fiber Dressing, 4x5 in (silver alginate) Discharge Instruction: Apply silver alginate to wound bed as instructed Secondary Dressing Bordered Gauze, 4x4 in Discharge Instruction: Apply over primary dressing as directed. Secured With Compression Wrap Compression Stockings Add-Ons Wound #4 (Lower Leg) Wound Laterality: Right, Lateral, Proximal Cleanser Soap and Water Discharge Instruction: May shower and wash wound with dial antibacterial soap and water prior to dressing change. Wound Cleanser Discharge Instruction: Cleanse the wound with wound cleanser prior to applying a clean dressing using gauze sponges, not tissue or cotton balls. Peri-Wound Care Topical Primary Dressing KerraCel Ag Gelling Fiber Dressing, 4x5 in (silver alginate) Discharge Instruction: Apply silver alginate to wound bed as instructed Santyl  Ointment Discharge Instruction: Apply nickel thick amount to wound bed as instructed Xeroform Gauze Dressing, 2x2 (in/in) HBD Secondary Dressing Bordered Gauze, 4x4 in Discharge Instruction: Apply over primary dressing as directed. Secured With Compression Wrap Compression Stockings Add-Ons Wound #6 (Trochanter) Wound Laterality: Left Cleanser Soap and Water Discharge Instruction: May shower and wash wound with dial antibacterial soap and water prior to dressing change. Wound Cleanser Discharge Instruction: Cleanse the wound with wound cleanser prior to applying a clean dressing using gauze sponges, not tissue or cotton balls. Peri-Wound Care Topical Gentamicin Discharge Instruction: As directed by physician Primary Dressing KerraCel Ag Gelling Fiber Dressing, 2x2 in (silver alginate) Discharge Instruction: Apply silver alginate to wound bed as instructed Secondary Dressing Bordered Gauze, 4x4 in Discharge Instruction: Apply over primary dressing as directed. Secured With Compression Wrap Compression Stockings Environmental education officer) Signed: 01/12/2022 10:26:21 AM By: Fredirick Maudlin MD FACS Entered By: Fredirick Maudlin on 01/12/2022 10:26:21 -------------------------------------------------------------------------------- Multi-Disciplinary Care Plan Details Patient Name: Date of Service: [name], [name] 01/12/2022 9:30 A M Medical Record Number: [id number] Patient Account Number:  [id number] Date of Birth/Sex: Treating RN: [birth year], [name] (86 y.o. [name] Primary Care Domnique Vanegas: [name] Other Clinician: Referring Xandra Laramee: Treating Dhani Imel/Extender: Carmie End in Treatment: 75 Active Inactive Wound/Skin Impairment Nursing Diagnoses: Impaired tissue integrity Goals: Patient/caregiver will verbalize understanding of skin care regimen Date Initiated: 10/02/2020 Target Resolution Date: 04/07/2022 Goal Status:  Active Ulcer/skin breakdown will have a volume reduction of 30% by week 4 Date Initiated: 10/02/2020 Date Inactivated: 01/06/2021 Target Resolution Date: 01/09/2021 Goal Status: Met Interventions: Assess patient/caregiver ability to obtain necessary supplies Assess patient/caregiver ability to perform ulcer/skin care regimen upon admission and as needed Assess ulceration(s) every visit Provide education on ulcer and skin care Treatment Activities: Skin care regimen initiated : 10/02/2020 Topical wound management initiated : 10/02/2020 Notes: 11/10/20 : Goal not yet met, eschar debrided off increasing wound size. Electronic Signature(s) Signed: 01/12/2022 4:47:34 PM By: Sharyn Creamer RN, BSN Entered By: Sharyn Creamer on 01/12/2022 10:22:16 -------------------------------------------------------------------------------- Pain Assessment Details Patient Name: Date of Service: [name], [name] 01/12/2022 9:30 A M Medical Record Number: [id number] Patient Account Number: [id number] Date of Birth/Sex: Treating RN: [birth year] (86 y.o. [name] Primary Care Geral Tuch: [name] Other Clinician: Referring Palmer Shorey: Treating Kiyani Jernigan/Extender: Carmie End in Treatment: Contreras Active Problems Location of Pain Severity and Description of Pain Patient Has Paino Patient Unable to Respond Site Locations Pain Management and Medication Current Pain Management: Electronic Signature(s) Signed: 01/12/2022 4:47:34 PM By: Sharyn Creamer RN, BSN Entered By: Sharyn Creamer on 01/12/2022 09:46:38 -------------------------------------------------------------------------------- Patient/Caregiver Education Details Patient Name: Date of Service: [name] 9/6/2023andnbsp9:30 [place] Record Number: [id number] Patient Account Number: [id number] Date of Birth/Gender: Treating RN: [name], [name] (86 y.o. [name] Primary Care Physician: [name] Other  Clinician: Referring Physician: Treating Physician/Extender: Carmie End in Treatment: 60 Education Assessment Education Provided To: Caregiver Education Topics Provided Offloading: Methods: Explain/Verbal Responses: State content correctly Pressure: Methods: Explain/Verbal Responses: State content correctly Wound/Skin Impairment: Methods: Explain/Verbal Responses: State content correctly Electronic Signature(s) Signed: 01/12/2022 4:47:34 PM By: Sharyn Creamer RN, BSN Entered By: Sharyn Creamer on 01/12/2022 10:22:46 -------------------------------------------------------------------------------- Wound Assessment Details Patient Name: Date of Service: [name], [name] 01/12/2022 9:30 A M Medical Record Number: [id number] Patient Account Number: [id number] Date of Birth/Sex: Treating RN: [birth year], [name] (86 y.o. [name] Primary Care Maronda Caison: [name] Other Clinician: Referring Kazim Corrales: Treating Tobenna Needs/Extender: Carmie End in Treatment: 66 Wound Status Wound Number: 2 Primary Pressure Ulcer Etiology: Wound Location: Left Gluteus Wound Status: Open Wounding Event: Pressure Injury Comorbid Anemia, Deep Vein Thrombosis, Hypertension, Type II Date Acquired: 09/18/2020 History: Diabetes, Dementia Weeks Of Treatment: 66 Clustered Wound: No Photos Wound Measurements Length: (cm) 4.8 Width: (cm) 2.2 Depth: (cm) 2 Area: (cm) 8.294 Volume: (cm) 16.588 % Reduction in Area: 80.1% % Reduction in Volume: -298.2% Epithelialization: Small (1-33%) Tunneling: No Undermining: Yes Starting Position (o'clock): Contreras Ending Position (o'clock): Contreras Maximum Distance: (cm) 2 Wound Description Classification: Category/Stage III Wound Margin: Epibole Exudate Amount: Medium Exudate Type: Serosanguineous Exudate Color: red, brown Foul Odor After Cleansing: No Slough/Fibrino Yes Wound Bed Granulation Amount: Medium  (34-66%) Exposed Structure Granulation Quality: Red, Pink Fascia Exposed: No Necrotic Amount: Medium (34-66%) Fat Layer (Subcutaneous Tissue) Exposed: Yes Necrotic Quality: Adherent Slough Tendon Exposed: No Muscle Exposed: No Joint Exposed: No Bone Exposed: No Treatment Notes Wound #2 (Gluteus) Wound Laterality: Left Cleanser Soap and Water Discharge Instruction: May shower and wash wound with dial antibacterial soap and water prior to dressing  change. Wound Cleanser Discharge Instruction: Cleanse the wound with wound cleanser prior to applying a clean dressing using gauze sponges, not tissue or cotton balls. Peri-Wound Care Topical Primary Dressing KerraCel Ag Gelling Fiber Dressing, 4x5 in (silver alginate) Discharge Instruction: Apply silver alginate to wound bed as instructed Secondary Dressing Bordered Gauze, 4x4 in Discharge Instruction: Apply over primary dressing as directed. Secured With Compression Wrap Compression Stockings Environmental education officer) Signed: 01/12/2022 4:47:34 PM By: Sharyn Creamer RN, BSN Entered By: Sharyn Creamer on 01/12/2022 10:01:00 -------------------------------------------------------------------------------- Wound Assessment Details Patient Name: Date of Service: [name], [name] 01/12/2022 9:30 A M Medical Record Number: [id number] Patient Account Number: [id number] Date of Birth/Sex: Treating RN: [birth year][name] (86 y.o. [name] Primary Care Keirstyn Aydt: [name] Other Clinician: Referring Tammela Bales: Treating Robena Ewy/Extender: Carmie End in Treatment: 66 Wound Status Wound Number: 4 Primary Pressure Ulcer Etiology: Wound Location: Right, Proximal, Lateral Lower Leg Wound Status: Open Wounding Event: Trauma Comorbid Anemia, Deep Vein Thrombosis, Hypertension, Type II Date Acquired: 09/08/2021 History: Diabetes, Dementia Weeks Of Treatment: 18 Clustered Wound: No Photos Wound  Measurements Length: (cm) 1.2 Width: (cm) 0.6 Depth: (cm) 0.2 Area: (cm) 0.565 Volume: (cm) 0.113 % Reduction in Area: -378.8% % Reduction in Volume: -841.7% Epithelialization: Small (1-33%) Tunneling: No Undermining: No Wound Description Classification: Category/Stage II Wound Margin: Distinct, outline attached Exudate Amount: Medium Exudate Type: Serosanguineous Exudate Color: red, brown Foul Odor After Cleansing: No Slough/Fibrino No Wound Bed Granulation Amount: Small (1-33%) Exposed Structure Granulation Quality: Pink Fascia Exposed: No Necrotic Amount: Large (67-100%) Fat Layer (Subcutaneous Tissue) Exposed: Yes Necrotic Quality: Adherent Slough Tendon Exposed: No Muscle Exposed: No Joint Exposed: No Bone Exposed: No Treatment Notes Wound #4 (Lower Leg) Wound Laterality: Right, Lateral, Proximal Cleanser Soap and Water Discharge Instruction: May shower and wash wound with dial antibacterial soap and water prior to dressing change. Wound Cleanser Discharge Instruction: Cleanse the wound with wound cleanser prior to applying a clean dressing using gauze sponges, not tissue or cotton balls. Peri-Wound Care Topical Primary Dressing KerraCel Ag Gelling Fiber Dressing, 4x5 in (silver alginate) Discharge Instruction: Apply silver alginate to wound bed as instructed Santyl Ointment Discharge Instruction: Apply nickel thick amount to wound bed as instructed Xeroform Gauze Dressing, 2x2 (in/in) HBD Secondary Dressing Bordered Gauze, 4x4 in Discharge Instruction: Apply over primary dressing as directed. Secured With Compression Wrap Compression Stockings Environmental education officer) Signed: 01/12/2022 4:47:34 PM By: Sharyn Creamer RN, BSN Entered By: Sharyn Creamer on 01/12/2022 09:51:47 -------------------------------------------------------------------------------- Wound Assessment Details Patient Name: Date of Service: [name], [name] 01/12/2022 9:30 A  M Medical Record Number: [id number] Patient Account Number: [id number] Date of Birth/Sex: Treating RN: [birth year]-[name] (86 y.o. [name] Primary Care Kamil Mchaffie: [name] Other Clinician: Referring Vivan Vanderveer: Treating Topher Buenaventura/Extender: Carmie End in Treatment: 66 Wound Status Wound Number: 6 Primary Pressure Ulcer Etiology: Wound Location: Left Trochanter Wound Status: Open Wounding Event: Pressure Injury Comorbid Anemia, Deep Vein Thrombosis, Hypertension, Type II Date Acquired: 5/Contreras/2023 History: Diabetes, Dementia Weeks Of Treatment: Contreras Clustered Wound: No Photos Wound Measurements Length: (cm) 2 Width: (cm) 2.1 Depth: (cm) 1 Area: (cm) 3.299 Volume: (cm) 3.299 % Reduction in Area: -10541.9% % Reduction in Volume: -109866.7% Epithelialization: Small (1-33%) Tunneling: No Undermining: Yes Starting Position (o'clock): 3 Ending Position (o'clock): 6 Maximum Distance: (cm) 0.5 Wound Description Classification: Unstageable/Unclassified Wound Margin: Flat and Intact Exudate Amount: Medium Exudate Type: Serosanguineous Exudate Color: red, brown Foul Odor After Cleansing: No Slough/Fibrino Yes Wound Bed Granulation Amount: Small (  1-33%) Exposed Structure Granulation Quality: Pink Fascia Exposed: No Necrotic Amount: Large (67-100%) Fat Layer (Subcutaneous Tissue) Exposed: No Necrotic Quality: Eschar, Adherent Slough Tendon Exposed: No Muscle Exposed: No Joint Exposed: No Bone Exposed: No Treatment Notes Wound #6 (Trochanter) Wound Laterality: Left Cleanser Soap and Water Discharge Instruction: May shower and wash wound with dial antibacterial soap and water prior to dressing change. Wound Cleanser Discharge Instruction: Cleanse the wound with wound cleanser prior to applying a clean dressing using gauze sponges, not tissue or cotton balls. Peri-Wound Care Topical Gentamicin Discharge Instruction: As directed by  physician Primary Dressing KerraCel Ag Gelling Fiber Dressing, 2x2 in (silver alginate) Discharge Instruction: Apply silver alginate to wound bed as instructed Secondary Dressing Bordered Gauze, 4x4 in Discharge Instruction: Apply over primary dressing as directed. Secured With Compression Wrap Compression Stockings Environmental education officer) Signed: 01/12/2022 4:47:34 PM By: Sharyn Creamer RN, BSN Entered By: Sharyn Creamer on 01/12/2022 09:59:14 -------------------------------------------------------------------------------- [place] Details Patient Name: Date of Service: [name] 01/12/2022 9:30 A M Medical Record Number: [id number] Patient Account Number: [id number] Date of Birth/Sex: Treating RN: [birth year] (86 y.o. [name] Primary Care Giovannie Scerbo: [name] Other Clinician: Referring Kabrina Christiano: Treating Shealynn Saulnier/Extender: Carmie End in Treatment: 66 Vital Signs Time Taken: 09:40 Temperature (F): 97.4 Height (in): 68 Pulse (bpm): 76 Weight (lbs): 110 Respiratory Rate (breaths/min): 14 Body Mass Index (BMI): 16.7 Blood Pressure (mmHg): 209/91 Reference Range: 80 - 120 mg / dl Notes daughter state pt pressure may be up due to movement this morning, states she is not concerned about pressure, md notified. Electronic Signature(s) Signed: 01/12/2022 4:47:34 PM By: Sharyn Creamer RN, BSN Entered By: Sharyn Creamer on 01/12/2022 09:46:25

## 2022-01-13 ENCOUNTER — Telehealth: Payer: Self-pay

## 2022-01-13 NOTE — Telephone Encounter (Signed)
Patient's daughter called., she has appointment tomorrow. She says that patient's BP has been running high. She has already given patient additional Clonidine,but wants to know if she can give patient another one before her dinner dose at about 7 pm.  Message routed to [name], NP

## 2022-01-14 ENCOUNTER — Encounter: Payer: Self-pay | Admitting: Nurse Practitioner

## 2022-01-14 ENCOUNTER — Ambulatory Visit (INDEPENDENT_AMBULATORY_CARE_PROVIDER_SITE_OTHER): Payer: Medicare Other | Admitting: Nurse Practitioner

## 2022-01-14 VITALS — BP 130/80 | HR 67 | Temp 97.5°F | Resp 16 | Ht 67.0 in

## 2022-01-14 DIAGNOSIS — L8990 Pressure ulcer of unspecified site, unspecified stage: Secondary | ICD-10-CM

## 2022-01-14 DIAGNOSIS — E1140 Type 2 diabetes mellitus with diabetic neuropathy, unspecified: Secondary | ICD-10-CM | POA: Diagnosis not present

## 2022-01-14 DIAGNOSIS — F0390 Unspecified dementia without behavioral disturbance: Secondary | ICD-10-CM

## 2022-01-14 DIAGNOSIS — E1142 Type 2 diabetes mellitus with diabetic polyneuropathy: Secondary | ICD-10-CM

## 2022-01-14 DIAGNOSIS — I10 Essential (primary) hypertension: Secondary | ICD-10-CM | POA: Diagnosis not present

## 2022-01-14 DIAGNOSIS — Z7984 Long term (current) use of oral hypoglycemic drugs: Secondary | ICD-10-CM | POA: Diagnosis not present

## 2022-01-14 DIAGNOSIS — L89320 Pressure ulcer of left buttock, unstageable: Secondary | ICD-10-CM | POA: Diagnosis not present

## 2022-01-14 DIAGNOSIS — L89153 Pressure ulcer of sacral region, stage 3: Secondary | ICD-10-CM

## 2022-01-14 DIAGNOSIS — L89892 Pressure ulcer of other site, stage 2: Secondary | ICD-10-CM | POA: Diagnosis not present

## 2022-01-14 DIAGNOSIS — E782 Mixed hyperlipidemia: Secondary | ICD-10-CM | POA: Diagnosis not present

## 2022-01-14 DIAGNOSIS — L89222 Pressure ulcer of left hip, stage 2: Secondary | ICD-10-CM | POA: Diagnosis not present

## 2022-01-14 NOTE — Progress Notes (Addendum)
Careteam: Patient Care Team: [name], NP as PCP - General (Geriatric Medicine) [name], MD as Consulting Physician (Oncology) [name], [name], LCSW as Social Worker (Licensed Clinical Social Worker) [name], RN as Registered Nurse (Hospice and Palliative Medicine)  PLACE OF SERVICE:  [place] Directive information Does Patient Have a Medical Advance Directive?: Yes, Type of Advance Directive: Healthcare Power of [name];Out of facility DNR (pink MOST or yellow form), Does patient want to make changes to medical advance directive?: No - Patient declined  No Known Allergies  Chief Complaint  Patient presents with   Medical Management of Chronic Issues    Routine Visit.    Health Maintenance    Discuss the need for Eye exam, Foot exam, and Hemoglobin A1C.    Immunizations    Discuss the need for Tetanus vaccine, and Covid Booster.    Labs     Fasting labs.      HPI: Patient is a 86 y.o. female for routine follow up.  Blood pressure has been high. She has taken 2 clonidine this morning.  Yesterday blood pressures were elevated as well.  She is also taking losartan 75 mg daily  Blood pressure has been up over the last 5 days.  Wound care visit earlier this week- bp was high She has wound on sacrum, now has another wound on left hip. Area got infected but this resolved. Wound is stable.  She has eschar on the knee- using santyl.  Hard to keep her off all pressure areas She is needing pressure reducing mattress.   She is on premiere protein shakes.   Blood sugars have been running a little high so daughter in low triturates metformin.    Review of Systems:  Review of Systems  Unable to perform ROS: Dementia    Past Medical History:  Diagnosis Date   Dementia ([place])    Diabetes mellitus type II 03/21/2011   DVT (deep venous thrombosis) ([place]) 03/21/2011   Hyperlipidemia 03/21/2011   Hypertension 03/21/2011   Lymphedema of  leg 03/21/2011   Pulmonary embolism ([place]) 03/21/2011   Stroke [facility])    Past Surgical History:  Procedure Laterality Date   CHOLECYSTECTOMY  2015   SKIN TAG REMOVAL  07/25/2016   VAGINAL HYSTERECTOMY     unknown of date   VASCULAR SURGERY     Social History:   reports that she has never smoked. She has never used smokeless tobacco. She reports that she does not drink alcohol and does not use drugs.  Family History  Problem Relation Age of Onset   Heart attack Mother    COPD Sister    Stroke Sister    Bipolar disorder Daughter     Medications: Patient's Medications  New Prescriptions   No medications on file  Previous Medications   A&D OINT    Apply 1 application topically as needed.    ACCU-CHEK SOFTCLIX LANCETS LANCETS    1 each by Other route 2 (two) times daily. Use as instructed Dx: E11.9   ACETAMINOPHEN (TYLENOL) 500 MG TABLET    Take 500 mg by mouth every 8 (eight) hours as needed (for general pain).    ATORVASTATIN (LIPITOR) 40 MG TABLET    TAKE ONE TABLET BY MOUTH EVERY DAY AT 6:00PM - NEED TO KEEP APPOINTMENT BEFORE ANYMORE REFILLS   BLOOD GLUCOSE CALIBRATION (ACCU-CHEK GUIDE CONTROL) LIQD    Use for blood sugar machine testing. Dx: E11.42   BLOOD  GLUCOSE MONITORING SUPPL (ACCU-CHEK GUIDE) W/DEVICE KIT    Use to test blood sugar twice daily. Dx: E11.9   CHOLECALCIFEROL (VITAMIN D-3) 125 MCG (5000 UT) TABS    Take 5,000 Units by mouth daily.   CLONIDINE (CATAPRES) 0.1 MG TABLET    Take 1 tablet (0.1 mg total) by mouth 2 (two) times daily. May take an additional tablet if needed SBP >170   COLLAGENASE (SANTYL EX)    Apply 1 Application topically as needed.   GABAPENTIN (NEURONTIN) 300 MG CAPSULE    TAKE ONE CAPSULE BY MOUTH TWICE A DAY   GLUCOSE BLOOD (ACCU-CHEK GUIDE) TEST STRIP    Use to test blood sugar twice daily. Dx: E11.42   LOSARTAN (COZAAR) 25 MG TABLET    TAKE ONE TABLET BY MOUTH EVERY DAY TAKE ALONG WITH 50MG TO EQUAL 75MG   LOSARTAN (COZAAR) 50 MG TABLET     TAKE ONE TABLET BY MOUTH EVERY DAY ALONG WITH 25MG TO EQUAL 75MG   METFORMIN (GLUCOPHAGE) 500 MG TABLET    TAKE ONE TABLET BY MOUTH TWICE A DAY WITH A MEAL   NON FORMULARY    Take 1 capsule by mouth See admin instructions. [employer] - UltraFlora Balance Daily Probiotic Immune Support* capsules- Take 1 capsule by mouth once a day   POLYETHYLENE GLYCOL (MIRALAX / GLYCOLAX) PACKET    Take 17 g by mouth daily as needed for mild constipation (MIX AND DRINK).    RIVAROXABAN (XARELTO) 15 MG TABS TABLET    TAKE ONE TABLET BY MOUTH EVERY DAY WITH SUPPER   ZINC GLUCONATE 50 MG TABLET    Take 50 mg by mouth daily.  Modified Medications   No medications on file  Discontinued Medications   No medications on file    Physical Exam:  Vitals:   01/14/22 1044 01/14/22 1402  BP: (!) 160/92 130/80  Pulse: 67   Resp: 16   Temp: (!) 97.5 F (36.4 C)   SpO2: 98%   Height: 5' 7" (1.702 m)    Body mass index is 20.37 kg/m. Wt Readings from Last 3 Encounters:  05/04/19 130 lb 1.1 oz (59 kg)  11/02/18 130 lb (59 kg)  01/30/18 130 lb (59 kg)    Physical Exam Constitutional:      Appearance: She is underweight. She is not diaphoretic.     Comments: Sits in wheelchair with head down.   HENT:     Head: Normocephalic and atraumatic.     Mouth/Throat:     Pharynx: No oropharyngeal exudate.  Eyes:     Conjunctiva/sclera: Conjunctivae normal.     Pupils: Pupils are equal, round, and reactive to light.  Cardiovascular:     Rate and Rhythm: Normal rate and regular rhythm.     Heart sounds: Normal heart sounds.  Pulmonary:     Effort: Pulmonary effort is normal.     Breath sounds: Normal breath sounds.  Abdominal:     General: Bowel sounds are normal.     Palpations: Abdomen is soft.  Musculoskeletal:     Cervical back: Normal range of motion and neck supple.     Right lower leg: No edema.     Left lower leg: No edema.     Comments: Contractures to upper extremities.   Skin:    General: Skin is  warm and dry.  Neurological:     Mental Status: She is lethargic.  Psychiatric:        Mood and Affect: Mood normal.  Labs reviewed: Basic Metabolic Panel: No results for input(s): "NA", "K", "CL", "CO2", "GLUCOSE", "BUN", "CREATININE", "CALCIUM", "MG", "PHOS", "TSH" in the last 8760 hours. Liver Function Tests: No results for input(s): "AST", "ALT", "ALKPHOS", "BILITOT", "PROT", "ALBUMIN" in the last 8760 hours. No results for input(s): "LIPASE", "AMYLASE" in the last 8760 hours. No results for input(s): "AMMONIA" in the last 8760 hours. CBC: No results for input(s): "WBC", "NEUTROABS", "HGB", "HCT", "MCV", "PLT" in the last 8760 hours. Lipid Panel: No results for input(s): "CHOL", "HDL", "LDLCALC", "TRIG", "CHOLHDL", "LDLDIRECT" in the last 8760 hours. TSH: No results for input(s): "TSH" in the last 8760 hours. A1C: Lab Results  Component Value Date   HGBA1C 7.0 (H) 01/04/2021     Assessment/Plan 1. Type 2 diabetes mellitus with diabetic polyneuropathy, without long-term current use of insulin (HCC) -Encouraged dietary compliance, routine foot care/monitoring and to keep up with diabetic eye exams through ophthalmology  - Hemoglobin A1c  2. Essential hypertension -elevated blood pressure over the last week- she has given an additional clonidine last night and today due to sbp over 170.  -will follow up labs today, may need to increase losartan to 100 mg daily  - CBC with Differential/Platelet - CMP with eGFR([facility])  3. Mixed hyperlipidemia -continues on lipitor daily with dietary modifications.  - CMP with eGFR([facility]) - Lipid panel  5. Pressure injury of sacral region, stage 3 ([place]) -followed by wound care. Continues pressure reduction and increase protein.  -continue dressing changes.   6. Pressure injury of skin, unspecified injury stage, unspecified location -followed by wound care. Continue pressure reduction and increase protein  -continue dressing changes.   -requesting pressure reduction mattress due to pressure ulcers.   7. Senile dementia without behavioral disturbance ([place]) -advance disease, total care provided by family. Continue supportive care.    Return in about 6 months (around 07/15/2022) for routine follow up. Carlos American. [place], [facility] [phone]

## 2022-01-14 NOTE — Telephone Encounter (Signed)
Pt seen today

## 2022-01-17 ENCOUNTER — Other Ambulatory Visit: Payer: Self-pay

## 2022-01-17 DIAGNOSIS — Z7984 Long term (current) use of oral hypoglycemic drugs: Secondary | ICD-10-CM | POA: Diagnosis not present

## 2022-01-17 DIAGNOSIS — L89892 Pressure ulcer of other site, stage 2: Secondary | ICD-10-CM | POA: Diagnosis not present

## 2022-01-17 DIAGNOSIS — F0390 Unspecified dementia without behavioral disturbance: Secondary | ICD-10-CM | POA: Diagnosis not present

## 2022-01-17 DIAGNOSIS — L89222 Pressure ulcer of left hip, stage 2: Secondary | ICD-10-CM | POA: Diagnosis not present

## 2022-01-17 DIAGNOSIS — E1140 Type 2 diabetes mellitus with diabetic neuropathy, unspecified: Secondary | ICD-10-CM | POA: Diagnosis not present

## 2022-01-17 DIAGNOSIS — L89320 Pressure ulcer of left buttock, unstageable: Secondary | ICD-10-CM | POA: Diagnosis not present

## 2022-01-17 MED ORDER — METFORMIN HCL 500 MG PO TABS: 1000.0000 mg | ORAL_TABLET | Freq: Two times a day (BID) | ORAL | 0 refills | Status: DC

## 2022-01-18 ENCOUNTER — Other Ambulatory Visit: Payer: Self-pay | Admitting: Nurse Practitioner

## 2022-01-18 MED ORDER — LOSARTAN POTASSIUM 100 MG PO TABS: ORAL_TABLET | ORAL | 1 refills | Status: DC

## 2022-01-18 MED ORDER — LOSARTAN POTASSIUM 100 MG PO TABS: 100.0000 mg | ORAL_TABLET | Freq: Every day | ORAL | 1 refills | Status: AC

## 2022-01-18 NOTE — Addendum Note (Signed)
Addended by: Alroy Bailiff B on: 01/18/2022 09:58 AM   Modules accepted: Orders

## 2022-01-19 LAB — LIPID PANEL
Cholesterol: 137 mg/dL (ref <200)
HDL: 40 mg/dL — ABNORMAL LOW (ref >=50)
LDL Cholesterol (Calc): 82 mg/dL (calc)
Non-HDL Cholesterol (Calc): 97 mg/dL (calc) (ref <130)
Total CHOL/HDL Ratio: 3.4 (calc) (ref <5.0)
Triglycerides: 71 mg/dL (ref <150)

## 2022-01-19 LAB — CBC WITH DIFFERENTIAL/PLATELET
Absolute Monocytes: 391 cells/uL (ref 200–950)
Basophils Absolute: 20 cells/uL (ref 0–200)
Basophils Relative: 0.6 %
Eosinophils Absolute: 31 cells/uL (ref 15–500)
Eosinophils Relative: 0.9 %
HCT: 28.3 % — ABNORMAL LOW (ref 35.0–45.0)
Hemoglobin: 8.7 g/dL — ABNORMAL LOW (ref 11.7–15.5)
Lymphs Abs: 1146 cells/uL (ref 850–3900)
MCH: 24.7 pg — ABNORMAL LOW (ref 27.0–33.0)
MCHC: 30.7 g/dL — ABNORMAL LOW (ref 32.0–36.0)
MCV: 80.4 fL (ref 80.0–100.0)
MPV: 8.8 fL (ref 7.5–12.5)
Monocytes Relative: 11.5 %
Neutro Abs: 1812 cells/uL (ref 1500–7800)
Neutrophils Relative %: 53.3 %
Platelets: 267 10*3/uL (ref 140–400)
RBC: 3.52 10*6/uL — ABNORMAL LOW (ref 3.80–5.10)
RDW: 14.8 % (ref 11.0–15.0)
Total Lymphocyte: 33.7 %
WBC: 3.4 10*3/uL — ABNORMAL LOW (ref 3.8–10.8)

## 2022-01-19 LAB — COMPLETE METABOLIC PANEL WITH GFR
AG Ratio: 0.8 (calc) — ABNORMAL LOW (ref 1.0–2.5)
ALT: 13 U/L (ref 6–29)
AST: 12 U/L (ref 10–35)
Albumin: 3.3 g/dL — ABNORMAL LOW (ref 3.6–5.1)
Alkaline phosphatase (APISO): 74 U/L (ref 37–153)
BUN: 17 mg/dL (ref 7–25)
CO2: 26 mmol/L (ref 20–32)
Calcium: 9.5 mg/dL (ref 8.6–10.4)
Chloride: 96 mmol/L — ABNORMAL LOW (ref 98–110)
Creat: 0.66 mg/dL (ref 0.60–0.95)
Globulin: 3.9 g/dL (calc) — ABNORMAL HIGH (ref 1.9–3.7)
Glucose, Bld: 235 mg/dL — ABNORMAL HIGH (ref 65–139)
Potassium: 4.5 mmol/L (ref 3.5–5.3)
Sodium: 135 mmol/L (ref 135–146)
Total Bilirubin: 0.3 mg/dL (ref 0.2–1.2)
Total Protein: 7.2 g/dL (ref 6.1–8.1)
eGFR: 86 mL/min/{1.73_m2} (ref >=60)

## 2022-01-19 LAB — TEST AUTHORIZATION

## 2022-01-19 LAB — IRON,TIBC AND FERRITIN PANEL
%SAT: 15 % (calc) — ABNORMAL LOW (ref 16–45)
Ferritin: 11 ng/mL — ABNORMAL LOW (ref 16–288)
Iron: 45 ug/dL (ref 45–160)
TIBC: 301 mcg/dL (calc) (ref 250–450)

## 2022-01-19 LAB — HEMOGLOBIN A1C
Hgb A1c MFr Bld: 8.3 % of total Hgb — ABNORMAL HIGH (ref <5.7)
Mean Plasma Glucose: 192 mg/dL
eAG (mmol/L): 10.6 mmol/L

## 2022-01-20 ENCOUNTER — Other Ambulatory Visit: Payer: Self-pay

## 2022-01-20 DIAGNOSIS — D508 Other iron deficiency anemias: Secondary | ICD-10-CM

## 2022-01-22 DIAGNOSIS — K5900 Constipation, unspecified: Secondary | ICD-10-CM | POA: Diagnosis not present

## 2022-01-22 DIAGNOSIS — I890 Lymphedema, not elsewhere classified: Secondary | ICD-10-CM | POA: Diagnosis not present

## 2022-01-22 DIAGNOSIS — F0390 Unspecified dementia without behavioral disturbance: Secondary | ICD-10-CM | POA: Diagnosis not present

## 2022-01-22 DIAGNOSIS — E1140 Type 2 diabetes mellitus with diabetic neuropathy, unspecified: Secondary | ICD-10-CM | POA: Diagnosis not present

## 2022-01-22 DIAGNOSIS — Z86711 Personal history of pulmonary embolism: Secondary | ICD-10-CM | POA: Diagnosis not present

## 2022-01-22 DIAGNOSIS — Z8673 Personal history of transient ischemic attack (TIA), and cerebral infarction without residual deficits: Secondary | ICD-10-CM | POA: Diagnosis not present

## 2022-01-22 DIAGNOSIS — Z7901 Long term (current) use of anticoagulants: Secondary | ICD-10-CM | POA: Diagnosis not present

## 2022-01-22 DIAGNOSIS — L89893 Pressure ulcer of other site, stage 3: Secondary | ICD-10-CM | POA: Diagnosis not present

## 2022-01-22 DIAGNOSIS — E785 Hyperlipidemia, unspecified: Secondary | ICD-10-CM | POA: Diagnosis not present

## 2022-01-22 DIAGNOSIS — M6281 Muscle weakness (generalized): Secondary | ICD-10-CM | POA: Diagnosis not present

## 2022-01-22 DIAGNOSIS — Z741 Need for assistance with personal care: Secondary | ICD-10-CM | POA: Diagnosis not present

## 2022-01-22 DIAGNOSIS — Z7984 Long term (current) use of oral hypoglycemic drugs: Secondary | ICD-10-CM | POA: Diagnosis not present

## 2022-01-22 DIAGNOSIS — I10 Essential (primary) hypertension: Secondary | ICD-10-CM | POA: Diagnosis not present

## 2022-01-22 DIAGNOSIS — L89224 Pressure ulcer of left hip, stage 4: Secondary | ICD-10-CM | POA: Diagnosis not present

## 2022-01-22 DIAGNOSIS — L89324 Pressure ulcer of left buttock, stage 4: Secondary | ICD-10-CM | POA: Diagnosis not present

## 2022-01-22 DIAGNOSIS — Z86718 Personal history of other venous thrombosis and embolism: Secondary | ICD-10-CM | POA: Diagnosis not present

## 2022-01-24 ENCOUNTER — Encounter (HOSPITAL_BASED_OUTPATIENT_CLINIC_OR_DEPARTMENT_OTHER): Payer: Medicare Other | Admitting: General Surgery

## 2022-01-24 DIAGNOSIS — L89893 Pressure ulcer of other site, stage 3: Secondary | ICD-10-CM | POA: Diagnosis not present

## 2022-01-24 DIAGNOSIS — L89223 Pressure ulcer of left hip, stage 3: Secondary | ICD-10-CM | POA: Diagnosis not present

## 2022-01-24 DIAGNOSIS — I10 Essential (primary) hypertension: Secondary | ICD-10-CM | POA: Diagnosis not present

## 2022-01-24 DIAGNOSIS — L89323 Pressure ulcer of left buttock, stage 3: Secondary | ICD-10-CM | POA: Diagnosis not present

## 2022-01-24 DIAGNOSIS — L89220 Pressure ulcer of left hip, unstageable: Secondary | ICD-10-CM | POA: Diagnosis not present

## 2022-01-24 DIAGNOSIS — E11622 Type 2 diabetes mellitus with other skin ulcer: Secondary | ICD-10-CM | POA: Diagnosis not present

## 2022-01-24 DIAGNOSIS — L89892 Pressure ulcer of other site, stage 2: Secondary | ICD-10-CM | POA: Diagnosis not present

## 2022-01-24 DIAGNOSIS — E119 Type 2 diabetes mellitus without complications: Secondary | ICD-10-CM | POA: Diagnosis not present

## 2022-01-24 DIAGNOSIS — L89324 Pressure ulcer of left buttock, stage 4: Secondary | ICD-10-CM | POA: Diagnosis not present

## 2022-01-24 NOTE — Progress Notes (Signed)
[name] ([id number]) , Visit Report for 01/24/2022 Arrival Information Details Patient Name: Date of Service: [name], [name] 01/24/2022 10:15 A M Medical Record Number: [id number] Patient Account Number: [id number] Date of Birth/Sex: Treating RN: Aug 05, 1935 (86 y.o. Valarie Cones, Mechele Claude Primary Care Tejal Monroy: [name] Other Clinician: Referring Shemia Bevel: Treating Keshia Weare/Extender: Carmie End in Treatment: 58 Visit Information History Since Last Visit Added or deleted any medications: No Patient Arrived: Wheel Chair Any new allergies or adverse reactions: No Arrival Time: 10:13 Had a fall or experienced change in No Accompanied By: daughter-in-law activities of daily living that may affect Transfer Assistance: Manual risk of falls: Patient Identification Verified: Yes Signs or symptoms of abuse/neglect since last visito No Patient Requires Transmission-Based Precautions: No Hospitalized since last visit: No Patient Has Alerts: No Implantable device outside of the clinic excluding No cellular tissue based products placed in the center since last visit: Has Dressing in Place as Prescribed: Yes Pain Present Now: No Electronic Signature(s) Signed: 01/24/2022 6:25:10 PM By: Dellie Catholic RN Entered By: Dellie Catholic on 01/24/2022 10:13:46 -------------------------------------------------------------------------------- Encounter Discharge Information Details Patient Name: Date of Service: [name] 01/24/2022 10:15 A M Medical Record Number: [id number] Patient Account Number: [id number] Date of Birth/Sex: Treating RN: 04/23/1936 (86 y.o. [name] Primary Care Tu Bayle: [name] Other Clinician: Referring Elysa Womac: Treating Marquis Diles/Extender: Carmie End in Treatment: 81 Encounter Discharge Information Items Post Procedure Vitals Discharge Condition: Stable Temperature (F):  97.2 Ambulatory Status: Wheelchair Pulse (bpm): 76 Discharge Destination: Home Respiratory Rate (breaths/min): 18 Transportation: Private Auto Blood Pressure (mmHg): 190/85 Accompanied By: daughter-in-law Schedule Follow-up Appointment: Yes Clinical Summary of Care: Patient Declined Electronic Signature(s) Signed: 01/24/2022 6:25:10 PM By: Dellie Catholic RN Entered By: Dellie Catholic on 01/24/2022 12:37:32 -------------------------------------------------------------------------------- Lower Extremity Assessment Details Patient Name: Date of Service: ZAYAH, KEILMAN 01/24/2022 10:15 A M Medical Record Number: [id number] Patient Account Number: [id number] Date of Birth/Sex: Treating RN: 09-Dec-1935 (86 y.o. [name] Primary Care Claire Dolores: [name] Other Clinician: Referring Sophronia Varney: Treating Tahani Potier/Extender: Carmie End in Treatment: 69 Electronic Signature(s) Signed: 01/24/2022 6:25:10 PM By: Dellie Catholic RN Entered By: Dellie Catholic on 01/24/2022 10:29:09 -------------------------------------------------------------------------------- Multi Wound Chart Details Patient Name: Date of Service: [name] 01/24/2022 10:15 A M Medical Record Number: [id number] Patient Account Number: [id number] Date of Birth/Sex: Treating RN: 06-Jan-1936 (86 y.o. [name] Primary Care Jamile Sivils: [name] Other Clinician: Referring Avrianna Smart: Treating Kollin Udell/Extender: Carmie End in Treatment: 21 Vital Signs Height(in): 8 Pulse(bpm): 7 Weight(lbs): 110 Blood Pressure(mmHg): 190/85 Body Mass Index(BMI): 16.7 Temperature(F): 97.2 Respiratory Rate(breaths/min): 18 Photos: Left Gluteus Right, Proximal, Lateral Lower Leg Left Trochanter Wound Location: Pressure Injury Trauma Pressure Injury Wounding Event: Pressure Ulcer Pressure Ulcer Pressure Ulcer Primary Etiology: Anemia, Deep Vein  Thrombosis, Anemia, Deep Vein Thrombosis, Anemia, Deep Vein Thrombosis, Comorbid History: Hypertension, Type II Diabetes, Hypertension, Type II Diabetes, Hypertension, Type II Diabetes, Dementia Dementia Dementia 09/18/2020 09/08/2021 09/17/2021 Date Acquired: 59 19 13 Weeks of Treatment: Open Open Open Wound Status: No No No Wound Recurrence: 4.5x2.3x2 1.2x0.6x0.2 2x2x0.6 Measurements L x W x D (cm) 8.129 0.565 3.142 A (cm) : rea 16.258 0.113 1.885 Volume (cm) : 80.50% -378.80% -10035.50% % Reduction in A rea: -290.30% -841.70% -62733.30% % Reduction in Volume: 12 3 Starting Position 1 (o'clock): 12 6 Ending Position 1 (o'clock): 2 0.5 Maximum Distance 1 (cm): Yes No Yes Undermining: Category/Stage III Category/Stage II Unstageable/Unclassified Classification: Medium Medium Medium Exudate A  mount: Serosanguineous Serosanguineous Serosanguineous Exudate Type: red, brown red, brown red, brown Exudate Color: Epibole Distinct, outline attached Flat and Intact Wound Margin: Medium (34-66%) Medium (34-66%) Large (67-100%) Granulation Amount: Red, Pink Pink Pink Granulation Quality: Medium (34-66%) Medium (34-66%) Small (1-33%) Necrotic Amount: Adherent [place] Necrotic Tissue: Fat Layer (Subcutaneous Tissue): Yes Fat Layer (Subcutaneous Tissue): Yes Fascia: No Exposed Structures: Fascia: No Fascia: No Fat Layer (Subcutaneous Tissue): No Tendon: No Tendon: No Tendon: No Muscle: No Muscle: No Muscle: No Joint: No Joint: No Joint: No Bone: No Bone: No Bone: No Small (1-33%) Small (1-33%) Small (1-33%) Epithelialization: Debridement - Selective/Open Wound Debridement - Excisional Debridement - Selective/Open Wound Debridement: Pre-procedure Verification/Time Out 10:48 10:48 10:48 Taken: Lidocaine 4% Topical Solution Lidocaine 4% Topical Solution Lidocaine 4% Topical Solution Pain Control: Slough Subcutaneous, [employer] Tissue Debrided: Non-Viable Tissue Skin/Subcutaneous Tissue Non-Viable Tissue Level: 10.35 0.72 4 Debridement A (sq cm): rea Curette Curette Curette Instrument: Minimum Minimum Minimum Bleeding: Pressure Pressure Pressure Hemostasis A chieved: 0 0 0 Procedural Pain: 0 0 0 Post Procedural Pain: Procedure was tolerated well Procedure was tolerated well Procedure was tolerated well Debridement Treatment Response: 4.5x2.3x2 1.2x0.6x0.2 2x2x0.6 Post Debridement Measurements L x W x D (cm) 16.258 0.113 1.885 Post Debridement Volume: (cm) Category/Stage III Category/Stage II Unstageable/Unclassified Post Debridement Stage: Debridement Debridement Debridement Procedures Performed: Treatment Notes Electronic Signature(s) Signed: 01/24/2022 11:09:37 AM By: Fredirick Maudlin MD FACS Signed: 01/24/2022 6:25:10 PM By: Dellie Catholic RN Entered By: Fredirick Maudlin on 01/24/2022 11:09:37 -------------------------------------------------------------------------------- Multi-Disciplinary Care Plan Details Patient Name: Date of Service: [name] 01/24/2022 10:15 A M Medical Record Number: [id number] Patient Account Number: [id number] Date of Birth/Sex: Treating RN: 06-22-1935 (85 y.o. [name] Primary Care Missey Hasley: [name] Other Clinician: Referring Sparrow [place]: Treating Luva Metzger/Extender: Carmie End in Treatment: 35 Active Inactive Wound/Skin Impairment Nursing Diagnoses: Impaired tissue integrity Goals: Patient/caregiver will verbalize understanding of skin care regimen Date Initiated: 10/02/2020 Target Resolution Date: 04/07/2022 Goal Status: Active Ulcer/skin breakdown will have a volume reduction of 30% by week 4 Date Initiated: 10/02/2020 Date Inactivated: 01/06/2021 Target Resolution Date: 01/09/2021 Goal Status: Met Interventions: Assess patient/caregiver ability to obtain necessary supplies Assess  patient/caregiver ability to perform ulcer/skin care regimen upon admission and as needed Assess ulceration(s) every visit Provide education on ulcer and skin care Treatment Activities: Skin care regimen initiated : 10/02/2020 Topical wound management initiated : 10/02/2020 Notes: 11/10/20 : Goal not yet met, eschar debrided off increasing wound size. Electronic Signature(s) Signed: 01/24/2022 6:25:10 PM By: Dellie Catholic RN Entered By: Dellie Catholic on 01/24/2022 12:34:14 -------------------------------------------------------------------------------- Pain Assessment Details Patient Name: Date of Service: [name], [name] 01/24/2022 10:15 A M Medical Record Number: [id number] Patient Account Number: [id number] Date of Birth/Sex: Treating RN: Jun 18, 1935 (86 y.o. [name] Primary Care Jonai Weyland: [name] Other Clinician: Referring Islay Polanco: Treating Kemesha Mosey/Extender: Carmie End in Treatment: 58 Active Problems Location of Pain Severity and Description of Pain Patient Has Paino No Site Locations Pain Management and Medication Current Pain Management: Electronic Signature(s) Signed: 01/24/2022 6:25:10 PM By: Dellie Catholic RN Entered By: Dellie Catholic on 01/24/2022 10:29:02 -------------------------------------------------------------------------------- Patient/Caregiver Education Details Patient Name: Date of Service: [name] 9/18/2023andnbsp10:15 [place] Record Number: [id number] Patient Account Number: [id number] Date of Birth/Gender: Treating RN: [birth year] (86 y.o. [name] Primary Care Physician: [name] Other Clinician: Referring Physician: Treating Physician/Extender: Carmie End in Treatment: 16 Education Assessment Education Provided To:  Patient Education Topics Provided Wound/Skin Impairment: Methods: Explain/Verbal Responses: Return demonstration  correctly Electronic Signature(s) Signed: 01/24/2022 6:25:10 PM By: Dellie Catholic RN Entered By: Dellie Catholic on 01/24/2022 12:34:29 -------------------------------------------------------------------------------- Wound Assessment Details Patient Name: Date of Service: [name], [name] 01/24/2022 10:15 A M Medical Record Number: [id number] Patient Account Number: [id number] Date of Birth/Sex: Treating RN: 01-15-1936 (86 y.o. [name] Primary Care Mylin Hirano: [name] Other Clinician: Referring Zyron Deeley: Treating Lakin Romer/Extender: Carmie End in Treatment: 68 Wound Status Wound Number: 2 Primary Pressure Ulcer Etiology: Wound Location: Left Gluteus Wound Status: Open Wounding Event: Pressure Injury Comorbid Anemia, Deep Vein Thrombosis, Hypertension, Type II Date Acquired: 09/18/2020 History: Diabetes, Dementia Weeks Of Treatment: 68 Clustered Wound: No Photos Wound Measurements Length: (cm) 4.5 Width: (cm) 2.3 Depth: (cm) 2 Area: (cm) 8.129 Volume: (cm) 16.258 % Reduction in Area: 80.5% % Reduction in Volume: -290.3% Epithelialization: Small (1-33%) Undermining: Yes Starting Position (o'clock): 12 Ending Position (o'clock): 12 Maximum Distance: (cm) 2 Wound Description Classification: Category/Stage III Wound Margin: Epibole Exudate Amount: Medium Exudate Type: Serosanguineous Exudate Color: red, brown Wound Bed Granulation Amount: Medium (34-66%) Granulation Quality: Red, Pink Necrotic Amount: Medium (34-66%) Necrotic Quality: Adherent Slough Foul Odor After Cleansing: No Slough/Fibrino Yes Exposed Structure Fascia Exposed: No Fat Layer (Subcutaneous Tissue) Exposed: Yes Tendon Exposed: No Muscle Exposed: No Joint Exposed: No Bone Exposed: No Treatment Notes Wound #2 (Gluteus) Wound Laterality: Left Cleanser Soap and Water Discharge Instruction: May shower and wash wound with dial antibacterial soap and  water prior to dressing change. Wound Cleanser Discharge Instruction: Cleanse the wound with wound cleanser prior to applying a clean dressing using gauze sponges, not tissue or cotton balls. Peri-Wound Care Topical Primary Dressing KerraCel Ag Gelling Fiber Dressing, 4x5 in (silver alginate) Discharge Instruction: Apply silver alginate to wound bed as instructed Secondary Dressing Bordered Gauze, 4x4 in Discharge Instruction: Apply over primary dressing as directed. Secured With Compression Wrap Compression Stockings Environmental education officer) Signed: 01/24/2022 6:25:10 PM By: Dellie Catholic RN Entered By: Dellie Catholic on 01/24/2022 10:43:53 -------------------------------------------------------------------------------- Wound Assessment Details Patient Name: Date of Service: [name], [name] 01/24/2022 10:15 A M Medical Record Number: [id number] Patient Account Number: [id number] Date of Birth/Sex: Treating RN: April 30, 1936 (86 y.o. [name] Primary Care Rendi Mapel: [name] Other Clinician: Referring Sherin Murdoch: Treating Melika Reder/Extender: Carmie End in Treatment: 68 Wound Status Wound Number: 4 Primary Pressure Ulcer Etiology: Wound Location: Right, Proximal, Lateral Lower Leg Wound Status: Open Wounding Event: Trauma Comorbid Anemia, Deep Vein Thrombosis, Hypertension, Type II Date Acquired: 09/08/2021 History: Diabetes, Dementia Weeks Of Treatment: 19 Clustered Wound: No Photos Wound Measurements Length: (cm) 1.2 Width: (cm) 0.6 Depth: (cm) 0.2 Area: (cm) 0.565 Volume: (cm) 0.113 % Reduction in Area: -378.8% % Reduction in Volume: -841.7% Epithelialization: Small (1-33%) Tunneling: No Undermining: No Wound Description Classification: Category/Stage II Wound Margin: Distinct, outline attached Exudate Amount: Medium Exudate Type: Serosanguineous Exudate Color: red, brown Foul Odor After Cleansing:  No Slough/Fibrino No Wound Bed Granulation Amount: Medium (34-66%) Exposed Structure Granulation Quality: Pink Fascia Exposed: No Necrotic Amount: Medium (34-66%) Fat Layer (Subcutaneous Tissue) Exposed: Yes Necrotic Quality: Adherent Slough Tendon Exposed: No Muscle Exposed: No Joint Exposed: No Bone Exposed: No Treatment Notes Wound #4 (Lower Leg) Wound Laterality: Right, Lateral, Proximal Cleanser Soap and Water Discharge Instruction: May shower and wash wound with dial antibacterial soap and water prior to dressing change. Wound Cleanser Discharge Instruction: Cleanse the wound with wound cleanser prior to applying a clean dressing  using gauze sponges, not tissue or cotton balls. Peri-Wound Care Topical Primary Dressing KerraCel Ag Gelling Fiber Dressing, 4x5 in (silver alginate) Discharge Instruction: Apply silver alginate to wound bed as instructed Santyl Ointment Discharge Instruction: Apply nickel thick amount to wound bed as instructed Xeroform Gauze Dressing, 2x2 (in/in) HBD Secondary Dressing Bordered Gauze, 4x4 in Discharge Instruction: Apply over primary dressing as directed. Secured With Compression Wrap Compression Stockings Environmental education officer) Signed: 01/24/2022 6:25:10 PM By: Dellie Catholic RN Entered By: Dellie Catholic on 01/24/2022 10:34:01 -------------------------------------------------------------------------------- Wound Assessment Details Patient Name: Date of Service: [name], [name] 01/24/2022 10:15 A M Medical Record Number: [id number] Patient Account Number: [id number] Date of Birth/Sex: Treating RN: 09-03-1935 (86 y.o. [name] Primary Care Faithlynn Deeley: [name] Other Clinician: Referring Rollyn Scialdone: Treating Lark Runk/Extender: Carmie End in Treatment: 68 Wound Status Wound Number: 6 Primary Pressure Ulcer Etiology: Wound Location: Left Trochanter Wound Status: Open Wounding  Event: Pressure Injury Comorbid Anemia, Deep Vein Thrombosis, Hypertension, Type II Date Acquired: 09/17/2021 History: Diabetes, Dementia Weeks Of Treatment: 13 Clustered Wound: No Photos Wound Measurements Length: (cm) 2 Width: (cm) 2 Depth: (cm) 0.6 Area: (cm) 3.142 Volume: (cm) 1.885 % Reduction in Area: -10035.5% % Reduction in Volume: -62733.3% Epithelialization: Small (1-33%) Undermining: Yes Starting Position (o'clock): 3 Ending Position (o'clock): 6 Maximum Distance: (cm) 0.5 Wound Description Classification: Unstageable/Unclassified Wound Margin: Flat and Intact Exudate Amount: Medium Exudate Type: Serosanguineous Exudate Color: red, brown Foul Odor After Cleansing: No Slough/Fibrino Yes Wound Bed Granulation Amount: Large (67-100%) Exposed Structure Granulation Quality: Pink Fascia Exposed: No Necrotic Amount: Small (1-33%) Fat Layer (Subcutaneous Tissue) Exposed: No Necrotic Quality: Eschar, Adherent Slough Tendon Exposed: No Muscle Exposed: No Joint Exposed: No Bone Exposed: No Treatment Notes Wound #6 (Trochanter) Wound Laterality: Left Cleanser Soap and Water Discharge Instruction: May shower and wash wound with dial antibacterial soap and water prior to dressing change. Wound Cleanser Discharge Instruction: Cleanse the wound with wound cleanser prior to applying a clean dressing using gauze sponges, not tissue or cotton balls. Peri-Wound Care Topical Primary Dressing KerraCel Ag Gelling Fiber Dressing, 2x2 in (silver alginate) Discharge Instruction: Apply silver alginate to wound bed as instructed Secondary Dressing Bordered Gauze, 4x4 in Discharge Instruction: Apply over primary dressing as directed. Secured With Compression Wrap Compression Stockings Environmental education officer) Signed: 01/24/2022 6:25:10 PM By: Dellie Catholic RN Entered By: Dellie Catholic on 01/24/2022  10:41:17 -------------------------------------------------------------------------------- Vitals Details Patient Name: Date of Service: [name] 01/24/2022 10:15 A M Medical Record Number: [id number] Patient Account Number: [id number] Date of Birth/Sex: Treating RN: 08/24/1935 (86 y.o. [name] Primary Care Aubrie Lucien: [name] Other Clinician: Referring Zimal Weisensel: Treating Anias Bartol/Extender: Carmie End in Treatment: 7 Vital Signs Time Taken: 10:25 Temperature (F): 97.2 Height (in): 68 Pulse (bpm): 76 Weight (lbs): 110 Respiratory Rate (breaths/min): 18 Body Mass Index (BMI): 16.7 Blood Pressure (mmHg): 190/85 Reference Range: 80 - 120 mg / dl Electronic Signature(s) Signed: 01/24/2022 6:25:10 PM By: Dellie Catholic RN Entered By: Dellie Catholic on 01/24/2022 10:28:54

## 2022-01-24 NOTE — Progress Notes (Signed)
[name] ([id number]) , Visit Report for 01/24/2022 Chief Complaint Document Details Patient Name: Date of Service: Carolyn, Contreras 01/24/2022 10:15 A M Medical Record Number: [id number] Patient Account Number: [id number] Date of Birth/Sex: Treating RN: [birth year] (86 y.o. [name] Primary Care Provider: [name] Other Clinician: Referring Provider: Treating Provider/Extender: Carmie End in Treatment: 2 Information Obtained from: Patient Chief Complaint LEFT buttocks pressure ulcer Electronic Signature(s) Signed: 01/24/2022 11:11:31 AM By: Fredirick Maudlin MD FACS Entered By: Fredirick Maudlin on 01/24/2022 11:11:31 -------------------------------------------------------------------------------- Debridement Details Patient Name: Date of Service: [name] 01/24/2022 10:15 A M Medical Record Number: [id number] Patient Account Number: [id number] Date of Birth/Sex: Treating RN: Aug 24, 1935 (86 y.o. [name] Primary Care Provider: [name] Other Clinician: Referring Provider: Treating Provider/Extender: Carmie End in Treatment: 68 Debridement Performed for Assessment: Wound #2 Left Gluteus Performed By: Physician Fredirick Maudlin, MD Debridement Type: Debridement Level of Consciousness (Pre-procedure): Awake and Alert Pre-procedure Verification/Time Out Yes - 10:48 Taken: Start Time: 10:48 Pain Control: Lidocaine 4% T opical Solution T Area Debrided (L x W): otal 4.5 (cm) x 2.3 (cm) = 10.35 (cm) Tissue and other material debrided: Non-Viable, Slough, Biofilm, Slough Level: Non-Viable Tissue Debridement Description: Selective/Open Wound Instrument: Curette Bleeding: Minimum Hemostasis Achieved: Pressure End Time: 10:49 Procedural Pain: 0 Post Procedural Pain: 0 Response to Treatment: Procedure was tolerated well Level of Consciousness (Post- Awake and Alert procedure): Post  Debridement Measurements of Total Wound Length: (cm) 4.5 Stage: Category/Stage III Width: (cm) 2.3 Depth: (cm) 2 Volume: (cm) 16.258 Character of Wound/Ulcer Post Debridement: Improved Post Procedure Diagnosis Same as Pre-procedure Notes Scribed for Dr Celine Ahr by J.Scotton Electronic Signature(s) Signed: 01/24/2022 12:49:39 PM By: Fredirick Maudlin MD FACS Signed: 01/24/2022 6:25:10 PM By: Dellie Catholic RN Entered By: Dellie Catholic on 01/24/2022 11:00:12 -------------------------------------------------------------------------------- Debridement Details Patient Name: Date of Service: [name] 01/24/2022 10:15 A M Medical Record Number: [id number] Patient Account Number: [id number] Date of Birth/Sex: Treating RN: November 23, 1935 (86 y.o. [name] Primary Care Provider: [name] Other Clinician: Referring Provider: Treating Provider/Extender: Carmie End in Treatment: 68 Debridement Performed for Assessment: Wound #6 Left Trochanter Performed By: Physician Fredirick Maudlin, MD Debridement Type: Debridement Level of Consciousness (Pre-procedure): Awake and Alert Pre-procedure Verification/Time Out Yes - 10:48 Taken: Start Time: 10:48 Pain Control: Lidocaine 4% T opical Solution T Area Debrided (L x W): otal 2 (cm) x 2 (cm) = 4 (cm) Tissue and other material debrided: Non-Viable, Slough, Biofilm, Slough Level: Non-Viable Tissue Debridement Description: Selective/Open Wound Instrument: Curette Bleeding: Minimum Hemostasis Achieved: Pressure End Time: 10:49 Procedural Pain: 0 Post Procedural Pain: 0 Response to Treatment: Procedure was tolerated well Level of Consciousness (Post- Awake and Alert procedure): Post Debridement Measurements of Total Wound Length: (cm) 2 Stage: Unstageable/Unclassified Width: (cm) 2 Depth: (cm) 0.6 Volume: (cm) 1.885 Character of Wound/Ulcer Post Debridement: Improved Post Procedure  Diagnosis Same as Pre-procedure Notes Scribed for Dr Celine Ahr by J.Scotton Electronic Signature(s) Signed: 01/24/2022 12:49:39 PM By: Fredirick Maudlin MD FACS Signed: 01/24/2022 6:25:10 PM By: Dellie Catholic RN Entered By: Dellie Catholic on 01/24/2022 11:01:01 -------------------------------------------------------------------------------- Debridement Details Patient Name: Date of Service: [name] 01/24/2022 10:15 A M Medical Record Number: [id number] Patient Account Number: [id number] Date of Birth/Sex: Treating RN: 1936/05/01 (86 y.o. [name] Primary Care Provider: [name] Other Clinician: Referring Provider: Treating Provider/Extender: Carmie End in Treatment: 74 Debridement Performed for Assessment: Wound #4 Right,Proximal,Lateral Lower Leg Performed  By: Physician Fredirick Maudlin, MD Debridement Type: Debridement Level of Consciousness (Pre-procedure): Awake and Alert Pre-procedure Verification/Time Out Yes - 10:48 Taken: Start Time: 10:48 Pain Control: Lidocaine 4% T opical Solution T Area Debrided (L x W): otal 1.2 (cm) x 0.6 (cm) = 0.72 (cm) Tissue and other material debrided: Non-Viable, Slough, Subcutaneous, Slough Level: Skin/Subcutaneous Tissue Debridement Description: Excisional Instrument: Curette Bleeding: Minimum Hemostasis Achieved: Pressure End Time: 10:49 Procedural Pain: 0 Post Procedural Pain: 0 Response to Treatment: Procedure was tolerated well Level of Consciousness (Post- Awake and Alert procedure): Post Debridement Measurements of Total Wound Length: (cm) 1.2 Stage: Category/Stage II Width: (cm) 0.6 Depth: (cm) 0.2 Volume: (cm) 0.113 Character of Wound/Ulcer Post Debridement: Improved Post Procedure Diagnosis Same as Pre-procedure Notes Scribed for Dr Celine Ahr by J.Scotton Electronic Signature(s) Signed: 01/24/2022 12:49:39 PM By: Fredirick Maudlin MD FACS Signed: 01/24/2022 6:25:10 PM  By: Dellie Catholic RN Entered By: Dellie Catholic on 01/24/2022 11:01:52 -------------------------------------------------------------------------------- HPI Details Patient Name: Date of Service: [name] 01/24/2022 10:15 A M Medical Record Number: [id number] Patient Account Number: [id number] Date of Birth/Sex: Treating RN: [birth year] (86 y.o. [name] Primary Care Provider: [name] Other Clinician: Referring Provider: Treating Provider/Extender: Carmie End in Treatment: 26 History of Present Illness HPI Description: Admission 5/27 [name] is an 86 year old female with a past medical history of type 2 diabetes on oral agents, hypertension and dementia that presents today to the clinic for a wound to her right buttocks. Her daughter-in-law is present today and helps provide the history. This issue has been going on for 2 to 3 weeks now. She states she developed an eschar about 1 week ago. She has been taking doxycycline for possible soft tissue infection to the area prescribed by her primary care provider. Patient currently denies any signs of infection. 6/3; patient admitted to the clinic last week. She has a large necrotic area on her left buttock. They use Santyl last week. On arrival this visit she had completely necrotic surface with open to subcutaneous tissue. The surface was completely nonviable. They have been using Santyl. She apparently had been on doxycycline which she is just finishing prescribed by her primary doctor for possible soft tissue infection 6/7; patient with a large necrotic wound over her left buttock. Aggressive debridement last week. Culture of this grew Proteus unfortunately would not of been covered well or at least predictably by the doxycycline that her primary doctor put her on. I have therefore put her on Augmentin suspension 3 times a day. We are using silver alginate as the primary dressing  while we deal with the infection. Her daughter has a list of concerns including swallowing difficulties, concerns about aspiration. The patient has advanced dementia. If this is Alzheimer's disease it is at its preterminal stages. I went over that swallowing difficulties are part of what happens in the preterminal stages of Alzheimer's disease. Nevertheless we will family members seems to want to pursue a very aggressive course 6/21 large wound over her left buttock. She has completed the antibiotics I gave her [Augmentin]. We are using silver alginate while we are dealing with the infection and also to help with the drainage In general the wound looks better she has healthy granulation over perhaps 70% of it. Superiorly there is still necrotic debris I did not attempt to debride this today The patient has advanced dementia. I do not know that she could tolerate a wound VAC. She also has double incontinence which would  make that difficult. Nevertheless she has been cared for diligently by her family. She is apparently eating well and may be 2 to 3 hours on this area all day 7/5; 2-week follow-up. She continues with a nice improvement in overall condition of the wound bed. The undermining area has still some adherent surface slough. We have been using silver alginate because of the drainage and possibility of at least superficial wound infection/colonization. Although I said in previous notes I wondered whether she could tolerate the wound VAC they have done so well with this wound at home I do not want to necessarily rule her out for a wound VAC and I am going to direct the start of a trial of wound VAC therapy if this can be covered by insurance, if we have home health support to change it at all 7/19; we did not get very far with the wound VAC because my original ICD 10 said this was unstageable. As usual this represents a considerable frustration because we would not of ordered a wound VAC if the  wound was still unstageable. This currently represents a stage IV staging. This is all the way down through muscle layer. There is a rim of tissue over the bone and no palpable exposed bone but this is a deep wound. Fortunately at present no evidence of infection. We have been using silver collagen with backing wet-to-dry. The wound is really no better today but no worse 8/2; patient presents for 2-week follow-up. She has been using the wound VAC for the past week. There has been some issues with keeping the drape in place however home health comes out to reapply the drape when this happens. Overall there are no issues or complaints today. No signs of infection. 8/17; patient has been using the wound VAC on her wound on the upper left buttock. About 50 to 60% of this area is fully granulated however from about 10-5 o'clock there is still undermining. UNFORTUNATELY she also arrives in clinic today with a wound over her right fibular head. According to her daughter has been there for about 2 weeks. She wonders whether there is an abrasion although no concrete history of this. Given her frail status this is probably most likely a pressure ulcer over a bony prominence [fibular head] 8/31 unfortunately the upper left buttock pressure ulcer stage IV is not as optimistically better as I was hoping. The granulation that I identified on 12/23/20 is no longer adherent at the inferior pole the undermining is quite extensive but there is no palpable bone. No evidence of surrounding infection. There may be some improvement in the undermining measurements We are still dealing with the additional wound we identified 2 weeks ago on the right fibular head as well we have been using silver alginate here 9/14; 1 month follow-up. She has now had the wound VAC on for probably 2 months. No major change here if she still has the tunneling area at 12:00 and undermining from about 6-12. Surface of the wound does not look  healthy. We use MolecuLight to look at the wound surface which actually looked negative however she has extensive immunofluorescence in the wound edge from about 12-6 o'clock this actually extends beyond the visible wound margin by quite amount. 9/27; no major change in the left buttock wound. There is still undermining however no exposed bone. Granulation looks reasonable. Right fibular head is much smaller We are using Hydrofera Blue on the right fibular head silver alginate on the buttock  area 10/12; left buttock wound is large with undermining. I think this is about the same as last time granulation looks reasonable there is no exposed bone. The area on the fibular head is almost completely closed 11/30; left buttock wound large wound with undermining superiorly. As her daughter points out there is some change in the granulation superiorly and more darker red color also some raised areas of hypergranulation. I cannot tell that the area is infected. We are using silver alginate she is changing this daily On the right fibular head the area is just about closed 12/28; the patient's wound over her right fibular head is closed. Her left buttock ulcer looks actually a little better. Measurements are slightly improved. There is no exposed bone. The patient's daughter came in asking me about more aggressive options to attempt to get closure of the subcu wound including surgery. I thought we had mostly agreed that this would be a palliative type setting and that I did not expect this to actually close nevertheless I tried to answer her questions. I do not think the patient is a candidate for a myocutaneous flap. She is simply too frail and I doubt plastic surgery would consider her a viable candidate. She might be a candidate for an advanced treatment option like Oasis which sometimes can stimulate granulation to get these wounds to gradually close. I told her in order for this to make sense she would  need probably a Foley catheter to control the incontinence and unfortunately weekly visits which would be difficult on this patient 1/25; 1 month follow-up. The patient has a large stage IV left buttock ulcer. Substantially deep but no exposed bone. She has advanced dementia. I do not think she is a candidate for anything aggressive and I follow her on a palliative basis. I been over this with her daughter many times. We have been using silver alginate backing ABDs. She has a level 3 surface on her mattress in spite of this she developed some degree of erythema today on the upper left pelvis 2/22; 1 month follow-up. Large left buttock pressure ulcer. We've been using silver alginate as a palliative dressing. Essentially deep wound with no exposed bone. No evidence of surrounding infection. This patient has advanced/preterminal dementia. Her daughter reports that he had a time where she was not eating very well although she seems to be improved lately. she does not appear to be systemically unwell 07/28/2021: 1 month follow-up. The overall wound dimensions are little bit smaller but she does have significant undermining and epibole. They have been using silver alginate in the wound. There is a small amount of slough buildup. The daughter-in-law that is with her today also expressed concern about some discoloration on the patient's left trochanter and right fibular head. 09/08/2021: 6-week follow-up. No significant change to the left buttock ulcer. There is a bit of slough buildup on the surface but it is otherwise clean without significant drainage or odor. The left trochanter looks like it may have partially broken down and then subsequently healed without intervention; there is some eschar overlying this and when I removed it, there was fresh pink tissue but no opening. On the right fibular head, this site has opened and there are 2 small wounds with a bit of slough accumulation. No odor or  drainage. 10/20/2021: 6-week follow-up. No real change to the left buttock ulcer. The left trochanter is open now. It is fairly superficial but does extend into the fat layer. The right fibular  head is clean and limited to the skin. She has a new area of tissue breakdown overlying her sacrum, just medial to her large left buttock ulcer. This looks like it is secondary to shear. 12/01/2021: 6-week follow-up. The left buttock ulcer remains unchanged. There is a light layer of slough and biofilm on the surface. The left trochanter has a heavy layer of eschar on it. The right fibular head wound is deeper today and has heavy slough accumulation. The area of tissue breakdown on her sacrum that was seen at her last visit has healed. 12/24/2021: The patient's daughter-in-law brought her to clinic today, earlier than her usual 6-week interval, because she had a lot more pain and drainage coming from her left trochanter. On inspection, the area is fluctuant and when pressure was applied, frank pus began draining from underneath the fibrinous eschar overlying the wound. The wound on her right fibular head is a little bit smaller but has some nonviable subcutaneous tissue underneath some slough. The sacral wound is unchanged with just a light layer of slough accumulation. 01/12/2022: The culture that I took from the wound on her left trochanter grew out a polymicrobial collection including Proteus mirabilis, strep, Enterococcus faecalis, Escherichia coli and multiple staph species. There was macrolide and tetracycline resistance. I prescribed a 10-day course of Augmentin and we have been applying topical gentamicin to the wound. She finished her oral antibiotics. Today, her sacral wound is unchanged. The left trochanter wound is substantially cleaner. There is still some slough and nonviable subcutaneous tissue present. The wound on her right fibular head has some dark fibrinous eschar that is fairly densely adherent.  The home health nurse had recommended to the patient's daughter-in-law that she apply Santyl to this location. 01/24/2022: The sacral wound is utterly unchanged. The left trochanter wound is cleaner, smaller, and shallower. The wound on the right fibular head continues to have slough accumulate but the fibrinous eschar has been successfully broken down by Santyl. Electronic Signature(s) Signed: 01/24/2022 11:12:25 AM By: Fredirick Maudlin MD FACS Entered By: Fredirick Maudlin on 01/24/2022 11:12:25 -------------------------------------------------------------------------------- Physical Exam Details Patient Name: Date of Service: Carolyn, Contreras 01/24/2022 10:15 A M Medical Record Number: [id number] Patient Account Number: [id number] Date of Birth/Sex: Treating RN: 07/30/1935 (86 y.o. [name] Primary Care Provider: [name] Other Clinician: Referring Provider: Treating Provider/Extender: Carmie End in Treatment: 38 Constitutional Hypertensive, asymptomatic. . . . No acute distress.Marland Kitchen Respiratory Normal work of breathing on room air.. Notes 01/24/2022: The sacral wound is utterly unchanged. The left trochanter wound is cleaner, smaller, and shallower. The wound on the right fibular head continues to have slough accumulate but the fibrinous eschar has been successfully broken down by Santyl. Electronic Signature(s) Signed: 01/24/2022 11:12:52 AM By: Fredirick Maudlin MD FACS Entered By: Fredirick Maudlin on 01/24/2022 11:12:52 -------------------------------------------------------------------------------- Physician Orders Details Patient Name: Date of Service: ZOPHIA, MARRONE 01/24/2022 10:15 A M Medical Record Number: [id number] Patient Account Number: [id number] Date of Birth/Sex: Treating RN: June 03, 1935 (86 y.o. [name] Primary Care Provider: [name] Other Clinician: Referring Provider: Treating Provider/Extender: Carmie End in Treatment: 80 Verbal / Phone Orders: No Diagnosis Coding ICD-10 Coding Code Description L89.324 Pressure ulcer of left buttock, stage 4 E11.9 Type 2 diabetes mellitus without complications R00.762 Pressure ulcer of left hip, stage 3 L89.893 Pressure ulcer of other site, stage 3 Follow-up Appointments Return appointment in 1 month. - Dr Celine Ahr Room 3 Monday October 16th at 10:15am Anesthetic (  In clinic) Topical Lidocaine 5% applied to wound bed - In clinic Bathing/ Shower/ Hygiene May shower and wash wound with soap and water. - prior to dressing change Off-Loading Low air-loss mattress (Group 2) Turn and reposition every 2 hours Additional Orders / Instructions Follow Nutritious Diet - 100-120g of Protein Non Wound Condition Bilateral Lower Extremities Protect area with: - Bilateral Lateral Knees , and Right Trochanter. Protect with Silicone Foam Borders or ABD pads [facility] wound care orders this week; continue Home Health for wound care. May utilize formulary equivalent dressing for wound treatment orders unless otherwise specified. - Santyl to R leg Norva Karvonen and Silver alginate to L trochanter Silver Alginate to glute Other Home Health Orders/Instructions: - GGYIRSW Wound Treatment Wound #2 - Gluteus Wound Laterality: Left Cleanser: Soap and Water Every Other Day/30 Days Discharge Instructions: May shower and wash wound with dial antibacterial soap and water prior to dressing change. Cleanser: Wound Cleanser Every Other Day/30 Days Discharge Instructions: Cleanse the wound with wound cleanser prior to applying a clean dressing using gauze sponges, not tissue or cotton balls. Prim Dressing: KerraCel Ag Gelling Fiber Dressing, 4x5 in (silver alginate) (Generic) Every Other Day/30 Days ary Discharge Instructions: Apply silver alginate to wound bed as instructed Secondary Dressing: Bordered Gauze, 4x4 in (Generic) Every Other Day/30  Days Discharge Instructions: Apply over primary dressing as directed. Wound #4 - Lower Leg Wound Laterality: Right, Lateral, Proximal Cleanser: Soap and Water Every Other Day/30 Days Discharge Instructions: May shower and wash wound with dial antibacterial soap and water prior to dressing change. Cleanser: Wound Cleanser Every Other Day/30 Days Discharge Instructions: Cleanse the wound with wound cleanser prior to applying a clean dressing using gauze sponges, not tissue or cotton balls. Prim Dressing: KerraCel Ag Gelling Fiber Dressing, 4x5 in (silver alginate) (Generic) Every Other Day/30 Days ary Discharge Instructions: Apply silver alginate to wound bed as instructed Prim Dressing: Santyl Ointment Every Other Day/30 Days ary Discharge Instructions: Apply nickel thick amount to wound bed as instructed Prim Dressing: Xeroform Gauze Dressing, 2x2 (in/in) HBD (Generic) Every Other Day/30 Days ary Secondary Dressing: Bordered Gauze, 4x4 in (Generic) Every Other Day/30 Days Discharge Instructions: Apply over primary dressing as directed. Wound #6 - Trochanter Wound Laterality: Left Cleanser: Soap and Water Every Other Day/30 Days Discharge Instructions: May shower and wash wound with dial antibacterial soap and water prior to dressing change. Cleanser: Wound Cleanser Every Other Day/30 Days Discharge Instructions: Cleanse the wound with wound cleanser prior to applying a clean dressing using gauze sponges, not tissue or cotton balls. Prim Dressing: KerraCel Ag Gelling Fiber Dressing, 2x2 in (silver alginate) (Generic) Every Other Day/30 Days ary Discharge Instructions: Apply silver alginate to wound bed as instructed Secondary Dressing: Bordered Gauze, 4x4 in (Generic) Every Other Day/30 Days Discharge Instructions: Apply over primary dressing as directed. Electronic Signature(s) Signed: 01/24/2022 12:49:39 PM By: Fredirick Maudlin MD FACS Entered By: Fredirick Maudlin on 01/24/2022  11:13:28 -------------------------------------------------------------------------------- Problem List Details Patient Name: Date of Service: Carolyn, Contreras 01/24/2022 10:15 A M Medical Record Number: [id number] Patient Account Number: [id number] Date of Birth/Sex: Treating RN: 23-Apr-1936 (86 y.o. [name] Primary Care Provider: [name] Other Clinician: Referring Provider: Treating Provider/Extender: Carmie End in Treatment: 26 Active Problems ICD-10 Encounter Code Description Active Date MDM Diagnosis L89.324 Pressure ulcer of left buttock, stage 4 11/24/2020 No Yes E11.9 Type 2 diabetes mellitus without complications 0/01/3817 No Yes L89.223 Pressure ulcer of left hip, stage 3 07/28/2021 No  Yes L89.893 Pressure ulcer of other site, stage 3 07/28/2021 No Yes Inactive Problems ICD-10 Code Description Active Date Inactive Date L89.320 Pressure ulcer of left buttock, unstageable 10/13/2020 10/13/2020 L89.93 Pressure ulcer of unspecified site, stage 3 12/23/2020 12/23/2020 Resolved Problems Electronic Signature(s) Signed: 01/24/2022 11:09:30 AM By: Fredirick Maudlin MD FACS Entered By: Fredirick Maudlin on 01/24/2022 11:09:30 -------------------------------------------------------------------------------- Progress Note Details Patient Name: Date of Service: [name] 01/24/2022 10:15 A M Medical Record Number: [id number] Patient Account Number: [id number] Date of Birth/Sex: Treating RN: 28-Sep-1935 (86 y.o. [name] Primary Care Provider: [name] Other Clinician: Referring Provider: Treating Provider/Extender: Carmie End in Treatment: 67 Subjective Chief Complaint Information obtained from Patient LEFT buttocks pressure ulcer History of Present Illness (HPI) Admission 5/27 [name] is an 86 year old female with a past medical history of type 2 diabetes on oral agents,  hypertension and dementia that presents today to the clinic for a wound to her right buttocks. Her daughter-in-law is present today and helps provide the history. This issue has been going on for 2 to 3 weeks now. She states she developed an eschar about 1 week ago. She has been taking doxycycline for possible soft tissue infection to the area prescribed by her primary care provider. Patient currently denies any signs of infection. 6/3; patient admitted to the clinic last week. She has a large necrotic area on her left buttock. They use Santyl last week. On arrival this visit she had completely necrotic surface with open to subcutaneous tissue. The surface was completely nonviable. They have been using Santyl. She apparently had been on doxycycline which she is just finishing prescribed by her primary doctor for possible soft tissue infection 6/7; patient with a large necrotic wound over her left buttock. Aggressive debridement last week. Culture of this grew Proteus unfortunately would not of been covered well or at least predictably by the doxycycline that her primary doctor put her on. I have therefore put her on Augmentin suspension 3 times a day. We are using silver alginate as the primary dressing while we deal with the infection. Her daughter has a list of concerns including swallowing difficulties, concerns about aspiration. The patient has advanced dementia. If this is Alzheimer's disease it is at its preterminal stages. I went over that swallowing difficulties are part of what happens in the preterminal stages of Alzheimer's disease. Nevertheless we will family members seems to want to pursue a very aggressive course 6/21 large wound over her left buttock. She has completed the antibiotics I gave her [Augmentin]. We are using silver alginate while we are dealing with the infection and also to help with the drainage In general the wound looks better she has healthy granulation over perhaps  70% of it. Superiorly there is still necrotic debris I did not attempt to debride this today The patient has advanced dementia. I do not know that she could tolerate a wound VAC. She also has double incontinence which would make that difficult. Nevertheless she has been cared for diligently by her family. She is apparently eating well and may be 2 to 3 hours on this area all day 7/5; 2-week follow-up. She continues with a nice improvement in overall condition of the wound bed. The undermining area has still some adherent surface slough. We have been using silver alginate because of the drainage and possibility of at least superficial wound infection/colonization. Although I said in previous notes I wondered whether she could tolerate the wound VAC they  have done so well with this wound at home I do not want to necessarily rule her out for a wound VAC and I am going to direct the start of a trial of wound VAC therapy if this can be covered by insurance, if we have home health support to change it at all 7/19; we did not get very far with the wound VAC because my original ICD 10 said this was unstageable. As usual this represents a considerable frustration because we would not of ordered a wound VAC if the wound was still unstageable. This currently represents a stage IV staging. This is all the way down through muscle layer. There is a rim of tissue over the bone and no palpable exposed bone but this is a deep wound. Fortunately at present no evidence of infection. We have been using silver collagen with backing wet-to-dry. The wound is really no better today but no worse 8/2; patient presents for 2-week follow-up. She has been using the wound VAC for the past week. There has been some issues with keeping the drape in place however home health comes out to reapply the drape when this happens. Overall there are no issues or complaints today. No signs of infection. 8/17; patient has been using the wound  VAC on her wound on the upper left buttock. About 50 to 60% of this area is fully granulated however from about 10-5 o'clock there is still undermining. UNFORTUNATELY she also arrives in clinic today with a wound over her right fibular head. According to her daughter has been there for about 2 weeks. She wonders whether there is an abrasion although no concrete history of this. Given her frail status this is probably most likely a pressure ulcer over a bony prominence [fibular head] 8/31 unfortunately the upper left buttock pressure ulcer stage IV is not as optimistically better as I was hoping. The granulation that I identified on 12/23/20 is no longer adherent at the inferior pole the undermining is quite extensive but there is no palpable bone. No evidence of surrounding infection. There may be some improvement in the undermining measurements We are still dealing with the additional wound we identified 2 weeks ago on the right fibular head as well we have been using silver alginate here 9/14; 1 month follow-up. She has now had the wound VAC on for probably 2 months. No major change here if she still has the tunneling area at 12:00 and undermining from about 6-12. Surface of the wound does not look healthy. We use MolecuLight to look at the wound surface which actually looked negative however she has extensive immunofluorescence in the wound edge from about 12-6 o'clock this actually extends beyond the visible wound margin by quite amount. 9/27; no major change in the left buttock wound. There is still undermining however no exposed bone. Granulation looks reasonable. oo Right fibular head is much smaller We are using Hydrofera Blue on the right fibular head silver alginate on the buttock area 10/12; left buttock wound is large with undermining. I think this is about the same as last time granulation looks reasonable there is no exposed bone. The area on the fibular head is almost completely  closed 11/30; left buttock wound large wound with undermining superiorly. As her daughter points out there is some change in the granulation superiorly and more darker red color also some raised areas of hypergranulation. I cannot tell that the area is infected. We are using silver alginate she is changing this daily  On the right fibular head the area is just about closed 12/28; the patient's wound over her right fibular head is closed. Her left buttock ulcer looks actually a little better. Measurements are slightly improved. There is no exposed bone. The patient's daughter came in asking me about more aggressive options to attempt to get closure of the subcu wound including surgery. I thought we had mostly agreed that this would be a palliative type setting and that I did not expect this to actually close nevertheless I tried to answer her questions. I do not think the patient is a candidate for a myocutaneous flap. She is simply too frail and I doubt plastic surgery would consider her a viable candidate. She might be a candidate for an advanced treatment option like Oasis which sometimes can stimulate granulation to get these wounds to gradually close. I told her in order for this to make sense she would need probably a Foley catheter to control the incontinence and unfortunately weekly visits which would be difficult on this patient 1/25; 1 month follow-up. The patient has a large stage IV left buttock ulcer. Substantially deep but no exposed bone. She has advanced dementia. I do not think she is a candidate for anything aggressive and I follow her on a palliative basis. I been over this with her daughter many times. We have been using silver alginate backing ABDs. She has a level 3 surface on her mattress in spite of this she developed some degree of erythema today on the upper left pelvis 2/22; 1 month follow-up. Large left buttock pressure ulcer. We've been using silver alginate as a palliative  dressing. Essentially deep wound with no exposed bone. No evidence of surrounding infection. This patient has advanced/preterminal dementia. Her daughter reports that he had a time where she was not eating very well although she seems to be improved lately. she does not appear to be systemically unwell 07/28/2021: 1 month follow-up. The overall wound dimensions are little bit smaller but she does have significant undermining and epibole. They have been using silver alginate in the wound. There is a small amount of slough buildup. The daughter-in-law that is with her today also expressed concern about some discoloration on the patient's left trochanter and right fibular head. 09/08/2021: 6-week follow-up. No significant change to the left buttock ulcer. There is a bit of slough buildup on the surface but it is otherwise clean without significant drainage or odor. The left trochanter looks like it may have partially broken down and then subsequently healed without intervention; there is some eschar overlying this and when I removed it, there was fresh pink tissue but no opening. On the right fibular head, this site has opened and there are 2 small wounds with a bit of slough accumulation. No odor or drainage. 10/20/2021: 6-week follow-up. No real change to the left buttock ulcer. The left trochanter is open now. It is fairly superficial but does extend into the fat layer. The right fibular head is clean and limited to the skin. She has a new area of tissue breakdown overlying her sacrum, just medial to her large left buttock ulcer. This looks like it is secondary to shear. 12/01/2021: 6-week follow-up. The left buttock ulcer remains unchanged. There is a light layer of slough and biofilm on the surface. The left trochanter has a heavy layer of eschar on it. The right fibular head wound is deeper today and has heavy slough accumulation. The area of tissue breakdown on her sacrum that  was seen at her last  visit has healed. 12/24/2021: The patient's daughter-in-law brought her to clinic today, earlier than her usual 6-week interval, because she had a lot more pain and drainage coming from her left trochanter. On inspection, the area is fluctuant and when pressure was applied, frank pus began draining from underneath the fibrinous eschar overlying the wound. The wound on her right fibular head is a little bit smaller but has some nonviable subcutaneous tissue underneath some slough. The sacral wound is unchanged with just a light layer of slough accumulation. 01/12/2022: The culture that I took from the wound on her left trochanter grew out a polymicrobial collection including Proteus mirabilis, strep, Enterococcus faecalis, Escherichia coli and multiple staph species. There was macrolide and tetracycline resistance. I prescribed a 10-day course of Augmentin and we have been applying topical gentamicin to the wound. She finished her oral antibiotics. Today, her sacral wound is unchanged. The left trochanter wound is substantially cleaner. There is still some slough and nonviable subcutaneous tissue present. The wound on her right fibular head has some dark fibrinous eschar that is fairly densely adherent. The home health nurse had recommended to the patient's daughter-in-law that she apply Santyl to this location. 01/24/2022: The sacral wound is utterly unchanged. The left trochanter wound is cleaner, smaller, and shallower. The wound on the right fibular head continues to have slough accumulate but the fibrinous eschar has been successfully broken down by Santyl. Patient History Unable to Obtain Patient History due to Dementia. Information obtained from Patient. Family History Stroke - Siblings, No family history of Cancer, Diabetes, Heart Disease, Hereditary Spherocytosis, Hypertension, Kidney Disease, Lung Disease, Seizures, Thyroid Problems, Tuberculosis. Social History Never smoker, Marital Status  - Divorced, Alcohol Use - Never, Drug Use - No History, Caffeine Use - Never. Medical History Eyes Denies history of Cataracts, Glaucoma, Optic Neuritis Ear/Nose/Mouth/Throat Denies history of Chronic sinus problems/congestion, Middle ear problems Hematologic/Lymphatic Patient has history of Anemia Denies history of Hemophilia, Human Immunodeficiency Virus, Lymphedema, Sickle Cell Disease Respiratory Denies history of Aspiration, Asthma, Chronic Obstructive Pulmonary Disease (COPD), Pneumothorax, Sleep Apnea, Tuberculosis Cardiovascular Patient has history of Deep Vein Thrombosis, Hypertension Denies history of Angina, Arrhythmia, Congestive Heart Failure, Coronary Artery Disease, Hypotension, Myocardial Infarction, Peripheral Arterial Disease, Peripheral Venous Disease, Phlebitis, Vasculitis Gastrointestinal Denies history of Cirrhosis , Colitis, Crohnoos, Hepatitis A, Hepatitis B, Hepatitis C Endocrine Patient has history of Type II Diabetes Denies history of Type I Diabetes Genitourinary Denies history of End Stage Renal Disease Immunological Denies history of Lupus Erythematosus, Raynaudoos, Scleroderma Integumentary (Skin) Denies history of History of Burn Musculoskeletal Denies history of Gout, Rheumatoid Arthritis, Osteoarthritis, Osteomyelitis Neurologic Patient has history of Dementia Denies history of Neuropathy, Quadriplegia, Paraplegia, Seizure Disorder Oncologic Denies history of Received Chemotherapy, Received Radiation Psychiatric Denies history of Anorexia/bulimia, Confinement Anxiety Hospitalization/Surgery History - cholecystectomy. - vaginal hysterectomy. - vascular surgery. Medical A Surgical History Notes nd Respiratory hx PE Cardiovascular atrial septal aneurysm, hyperlipidemia Genitourinary recent UTi Neurologic CVA Objective Constitutional Hypertensive, asymptomatic. No acute distress.. Vitals Time Taken: 10:25 AM, Height: 68 in, Weight: 110  lbs, BMI: 16.7, Temperature: 97.2 F, Pulse: 76 bpm, Respiratory Rate: 18 breaths/min, Blood Pressure: 190/85 mmHg. Respiratory Normal work of breathing on room air.. General Notes: 01/24/2022: The sacral wound is utterly unchanged. The left trochanter wound is cleaner, smaller, and shallower. The wound on the right fibular head continues to have slough accumulate but the fibrinous eschar has been successfully broken down by Santyl. Integumentary (Hair, Skin) Wound #2  status is Open. Original cause of wound was Pressure Injury. The date acquired was: 09/18/2020. The wound has been in treatment 68 weeks. The wound is located on the Left Gluteus. The wound measures 4.5cm length x 2.3cm width x 2cm depth; 8.129cm^2 area and 16.258cm^3 volume. There is Fat Layer (Subcutaneous Tissue) exposed. There is undermining starting at 12:00 and ending at 12:00 with a maximum distance of 2cm. There is a medium amount of serosanguineous drainage noted. The wound margin is epibole. There is medium (34-66%) red, pink granulation within the wound bed. There is a medium (34- 66%) amount of necrotic tissue within the wound bed including Adherent Slough. Wound #4 status is Open. Original cause of wound was Trauma. The date acquired was: 09/08/2021. The wound has been in treatment 19 weeks. The wound is located on the Right,Proximal,Lateral Lower Leg. The wound measures 1.2cm length x 0.6cm width x 0.2cm depth; 0.565cm^2 area and 0.113cm^3 volume. There is Fat Layer (Subcutaneous Tissue) exposed. There is no tunneling or undermining noted. There is a medium amount of serosanguineous drainage noted. The wound margin is distinct with the outline attached to the wound base. There is medium (34-66%) pink granulation within the wound bed. There is a medium (34-66%) amount of necrotic tissue within the wound bed including Adherent Slough. Wound #6 status is Open. Original cause of wound was Pressure Injury. The date acquired was:  09/17/2021. The wound has been in treatment 13 weeks. The wound is located on the Left Trochanter. The wound measures 2cm length x 2cm width x 0.6cm depth; 3.142cm^2 area and 1.885cm^3 volume. There is undermining starting at 3:00 and ending at 6:00 with a maximum distance of 0.5cm. There is a medium amount of serosanguineous drainage noted. The wound margin is flat and intact. There is large (67-100%) pink granulation within the wound bed. There is a small (1-33%) amount of necrotic tissue within the wound bed including Eschar and Adherent Slough. Assessment Active Problems ICD-10 Pressure ulcer of left buttock, stage 4 Type 2 diabetes mellitus without complications Pressure ulcer of left hip, stage 3 Pressure ulcer of other site, stage 3 Procedures Wound #2 Pre-procedure diagnosis of Wound #2 is a Pressure Ulcer located on the Left Gluteus . There was a Selective/Open Wound Non-Viable Tissue Debridement with a total area of 10.35 sq cm performed by Fredirick Maudlin, MD. With the following instrument(s): Curette to remove Non-Viable tissue/material. Material removed includes Slough and Biofilm and after achieving pain control using Lidocaine 4% T opical Solution. No specimens were taken. A time out was conducted at 10:48, prior to the start of the procedure. A Minimum amount of bleeding was controlled with Pressure. The procedure was tolerated well with a pain level of 0 throughout and a pain level of 0 following the procedure. Post Debridement Measurements: 4.5cm length x 2.3cm width x 2cm depth; 16.258cm^3 volume. Post debridement Stage noted as Category/Stage III. Character of Wound/Ulcer Post Debridement is improved. Post procedure Diagnosis Wound #2: Same as Pre-Procedure General Notes: Scribed for Dr Celine Ahr by J.Scotton. Wound #4 Pre-procedure diagnosis of Wound #4 is a Pressure Ulcer located on the Right,Proximal,Lateral Lower Leg . There was a Excisional Skin/Subcutaneous  Tissue Debridement with a total area of 0.72 sq cm performed by Fredirick Maudlin, MD. With the following instrument(s): Curette to remove Non-Viable tissue/material. Material removed includes Subcutaneous Tissue and Slough and after achieving pain control using Lidocaine 4% T opical Solution. No specimens were taken. A time out was conducted at 10:48, prior to the  start of the procedure. A Minimum amount of bleeding was controlled with Pressure. The procedure was tolerated well with a pain level of 0 throughout and a pain level of 0 following the procedure. Post Debridement Measurements: 1.2cm length x 0.6cm width x 0.2cm depth; 0.113cm^3 volume. Post debridement Stage noted as Category/Stage II. Character of Wound/Ulcer Post Debridement is improved. Post procedure Diagnosis Wound #4: Same as Pre-Procedure General Notes: Scribed for Dr Celine Ahr by J.Scotton. Wound #6 Pre-procedure diagnosis of Wound #6 is a Pressure Ulcer located on the Left Trochanter . There was a Selective/Open Wound Non-Viable Tissue Debridement with a total area of 4 sq cm performed by Fredirick Maudlin, MD. With the following instrument(s): Curette to remove Non-Viable tissue/material. Material removed includes Slough and Biofilm and after achieving pain control using Lidocaine 4% T opical Solution. No specimens were taken. A time out was conducted at 10:48, prior to the start of the procedure. A Minimum amount of bleeding was controlled with Pressure. The procedure was tolerated well with a pain level of 0 throughout and a pain level of 0 following the procedure. Post Debridement Measurements: 2cm length x 2cm width x 0.6cm depth; 1.885cm^3 volume. Post debridement Stage noted as Unstageable/Unclassified. Character of Wound/Ulcer Post Debridement is improved. Post procedure Diagnosis Wound #6: Same as Pre-Procedure General Notes: Scribed for Dr Celine Ahr by J.Scotton. Plan Follow-up Appointments: Return appointment in 1 month.  - Dr Celine Ahr Room 3 Monday October 16th at 10:15am Anesthetic: (In clinic) Topical Lidocaine 5% applied to wound bed - In clinic Bathing/ Shower/ Hygiene: May shower and wash wound with soap and water. - prior to dressing change Off-Loading: Low air-loss mattress (Group 2) Turn and reposition every 2 hours Additional Orders / Instructions: Follow Nutritious Diet - 100-120g of Protein Non Wound Condition: Protect area with: - Bilateral Lateral Knees , and Right Trochanter. Protect with Silicone Foam Borders or ABD pads Home Health: New wound care orders this week; continue Home Health for wound care. May utilize formulary equivalent dressing for wound treatment orders unless otherwise specified. - Santyl to R leg Norva Karvonen and Silver alginate to L trochanter Silver Alginate to glute Other Home Health Orders/Instructions: - ZOXWRUE WOUND #2: - Gluteus Wound Laterality: Left Cleanser: Soap and Water Every Other Day/30 Days Discharge Instructions: May shower and wash wound with dial antibacterial soap and water prior to dressing change. Cleanser: Wound Cleanser Every Other Day/30 Days Discharge Instructions: Cleanse the wound with wound cleanser prior to applying a clean dressing using gauze sponges, not tissue or cotton balls. Prim Dressing: KerraCel Ag Gelling Fiber Dressing, 4x5 in (silver alginate) (Generic) Every Other Day/30 Days ary Discharge Instructions: Apply silver alginate to wound bed as instructed Secondary Dressing: Bordered Gauze, 4x4 in (Generic) Every Other Day/30 Days Discharge Instructions: Apply over primary dressing as directed. WOUND #4: - Lower Leg Wound Laterality: Right, Lateral, Proximal Cleanser: Soap and Water Every Other Day/30 Days Discharge Instructions: May shower and wash wound with dial antibacterial soap and water prior to dressing change. Cleanser: Wound Cleanser Every Other Day/30 Days Discharge Instructions: Cleanse the wound with wound cleanser prior to  applying a clean dressing using gauze sponges, not tissue or cotton balls. Prim Dressing: KerraCel Ag Gelling Fiber Dressing, 4x5 in (silver alginate) (Generic) Every Other Day/30 Days ary Discharge Instructions: Apply silver alginate to wound bed as instructed Prim Dressing: Santyl Ointment Every Other Day/30 Days ary Discharge Instructions: Apply nickel thick amount to wound bed as instructed Prim Dressing: Xeroform Gauze Dressing, 2x2 (in/in) HBD (  Generic) Every Other Day/30 Days ary Secondary Dressing: Bordered Gauze, 4x4 in (Generic) Every Other Day/30 Days Discharge Instructions: Apply over primary dressing as directed. WOUND #6: - Trochanter Wound Laterality: Left Cleanser: Soap and Water Every Other Day/30 Days Discharge Instructions: May shower and wash wound with dial antibacterial soap and water prior to dressing change. Cleanser: Wound Cleanser Every Other Day/30 Days Discharge Instructions: Cleanse the wound with wound cleanser prior to applying a clean dressing using gauze sponges, not tissue or cotton balls. Prim Dressing: KerraCel Ag Gelling Fiber Dressing, 2x2 in (silver alginate) (Generic) Every Other Day/30 Days ary Discharge Instructions: Apply silver alginate to wound bed as instructed Secondary Dressing: Bordered Gauze, 4x4 in (Generic) Every Other Day/30 Days Discharge Instructions: Apply over primary dressing as directed. 01/24/2022: The sacral wound is utterly unchanged. The left trochanter wound is cleaner, smaller, and shallower. The wound on the right fibular head continues to have slough accumulate but the fibrinous eschar has been successfully broken down by Santyl. Used a curette to debride slough and biofilm from the sacrum and the left trochanter wound. I debrided slough and subcutaneous tissue from the right fibular head. I think we can discontinue the gentamicin in the left trochanter. We will continue Santyl right fibular head. Continue silver alginate to  trochanter and sacrum. We will have her follow-up in 1 month. Electronic Signature(s) Signed: 01/24/2022 11:14:28 AM By: Fredirick Maudlin MD FACS Entered By: Fredirick Maudlin on 01/24/2022 11:14:28 -------------------------------------------------------------------------------- HxROS Details Patient Name: Date of Service: Carolyn, Contreras 01/24/2022 10:15 A M Medical Record Number: [id number] Patient Account Number: [id number] Date of Birth/Sex: Treating RN: 05-10-1935 (86 y.o. [name] Primary Care Provider: [name] Other Clinician: Referring Provider: Treating Provider/Extender: Carmie End in Treatment: 77 Unable to Obtain Patient History due to Dementia Information Obtained From Patient Eyes Medical History: Negative for: Cataracts; Glaucoma; Optic Neuritis Ear/Nose/Mouth/Throat Medical History: Negative for: Chronic sinus problems/congestion; Middle ear problems Hematologic/Lymphatic Medical History: Positive for: Anemia Negative for: Hemophilia; Human Immunodeficiency Virus; Lymphedema; Sickle Cell Disease Respiratory Medical History: Negative for: Aspiration; Asthma; Chronic Obstructive Pulmonary Disease (COPD); Pneumothorax; Sleep Apnea; Tuberculosis Past Medical History Notes: hx PE Cardiovascular Medical History: Positive for: Deep Vein Thrombosis; Hypertension Negative for: Angina; Arrhythmia; Congestive Heart Failure; Coronary Artery Disease; Hypotension; Myocardial Infarction; Peripheral Arterial Disease; Peripheral Venous Disease; Phlebitis; Vasculitis Past Medical History Notes: atrial septal aneurysm, hyperlipidemia Gastrointestinal Medical History: Negative for: Cirrhosis ; Colitis; Crohns; Hepatitis A; Hepatitis B; Hepatitis C Endocrine Medical History: Positive for: Type II Diabetes Negative for: Type I Diabetes Time with diabetes: 15 years Treated with: Oral agents Blood sugar tested every day:  Yes Tested : 2 times per day Genitourinary Medical History: Negative for: End Stage Renal Disease Past Medical History Notes: recent UTi Immunological Medical History: Negative for: Lupus Erythematosus; Raynauds; Scleroderma Integumentary (Skin) Medical History: Negative for: History of Burn Musculoskeletal Medical History: Negative for: Gout; Rheumatoid Arthritis; Osteoarthritis; Osteomyelitis Neurologic Medical History: Positive for: Dementia Negative for: Neuropathy; Quadriplegia; Paraplegia; Seizure Disorder Past Medical History Notes: CVA Oncologic Medical History: Negative for: Received Chemotherapy; Received Radiation Psychiatric Medical History: Negative for: Anorexia/bulimia; Confinement Anxiety Immunizations Pneumococcal Vaccine: Received Pneumococcal Vaccination: No Implantable Devices None Hospitalization / Surgery History Type of Hospitalization/Surgery cholecystectomy vaginal hysterectomy vascular surgery Family and Social History Cancer: No; Diabetes: No; Heart Disease: No; Hereditary Spherocytosis: No; Hypertension: No; Kidney Disease: No; Lung Disease: No; Seizures: No; Stroke: Yes - Siblings; Thyroid Problems: No; Tuberculosis: No; Never smoker; Marital Status - Divorced;  Alcohol Use: Never; Drug Use: No History; Caffeine Use: Never; Financial Concerns: No; Food, Clothing or Shelter Needs: No; Support System Lacking: No; Transportation Concerns: No Electronic Signature(s) Signed: 01/24/2022 12:49:39 PM By: Fredirick Maudlin MD FACS Signed: 01/24/2022 6:25:10 PM By: Dellie Catholic RN Entered By: Fredirick Maudlin on 01/24/2022 11:12:29 -------------------------------------------------------------------------------- SuperBill Details Patient Name: Date of Service: [name] 01/24/2022 Medical Record Number: [id number] Patient Account Number: [id number] Date of Birth/Sex: Treating RN: 10/03/1935 (86 y.o. [name] Primary Care Provider:  [name] Other Clinician: Referring Provider: Treating Provider/Extender: Carmie End in Treatment: 70 Diagnosis Coding ICD-10 Codes Code Description [phone] Pressure ulcer of left buttock, stage 4 E11.9 Type 2 diabetes mellitus without complications V37.106 Pressure ulcer of left hip, stage 3 L89.893 Pressure ulcer of other site, stage 3 Facility Procedures CPT4 Code: [id number] Description: 27035 - DEB SUBQ TISSUE 20 SQ CM/< ICD-10 Diagnosis Description L89.893 Pressure ulcer of other site, stage 3 Modifier: Quantity: 1 CPT4 Code: [id number] Description: 99371 - DEBRIDE WOUND 1ST 20 SQ CM OR < ICD-10 Diagnosis Description L89.324 Pressure ulcer of left buttock, stage 4 L89.223 Pressure ulcer of left hip, stage 3 Modifier: Quantity: 1 Physician Procedures : CPT4 Code Description Modifier [id number] - WC PHYS LEVEL 4 - EST PT 25 ICD-10 Diagnosis Description L89.324 Pressure ulcer of left buttock, stage 4 L89.223 Pressure ulcer of left hip, stage 3 L89.893 Pressure ulcer of other site, stage 3 E11.9 Type  2 diabetes mellitus without complications Quantity: 1 : [id number] 11042 - WC PHYS SUBQ TISS 20 SQ CM ICD-10 Diagnosis Description L89.893 Pressure ulcer of other site, stage 3 Quantity: 1 : [id number] 35361 - WC PHYS DEBR WO ANESTH 20 SQ CM ICD-10 Diagnosis Description L89.324 Pressure ulcer of left buttock, stage 4 L89.223 Pressure ulcer of left hip, stage 3 Quantity: 1 Electronic Signature(s) Signed: 01/24/2022 11:14:58 AM By: Fredirick Maudlin MD FACS Entered By: Fredirick Maudlin on 01/24/2022 11:14:57

## 2022-01-27 DIAGNOSIS — E1140 Type 2 diabetes mellitus with diabetic neuropathy, unspecified: Secondary | ICD-10-CM | POA: Diagnosis not present

## 2022-01-27 DIAGNOSIS — F0390 Unspecified dementia without behavioral disturbance: Secondary | ICD-10-CM | POA: Diagnosis not present

## 2022-01-27 DIAGNOSIS — L89893 Pressure ulcer of other site, stage 3: Secondary | ICD-10-CM | POA: Diagnosis not present

## 2022-01-27 DIAGNOSIS — L89224 Pressure ulcer of left hip, stage 4: Secondary | ICD-10-CM | POA: Diagnosis not present

## 2022-01-27 DIAGNOSIS — L89324 Pressure ulcer of left buttock, stage 4: Secondary | ICD-10-CM | POA: Diagnosis not present

## 2022-01-27 DIAGNOSIS — Z7984 Long term (current) use of oral hypoglycemic drugs: Secondary | ICD-10-CM | POA: Diagnosis not present

## 2022-01-31 ENCOUNTER — Other Ambulatory Visit: Payer: Self-pay | Admitting: Family

## 2022-01-31 DIAGNOSIS — D649 Anemia, unspecified: Secondary | ICD-10-CM

## 2022-02-01 ENCOUNTER — Encounter: Payer: Self-pay | Admitting: Family

## 2022-02-01 ENCOUNTER — Inpatient Hospital Stay (HOSPITAL_BASED_OUTPATIENT_CLINIC_OR_DEPARTMENT_OTHER): Payer: Medicare Other | Admitting: Family

## 2022-02-01 ENCOUNTER — Inpatient Hospital Stay: Payer: Medicare Other | Attending: Hematology & Oncology

## 2022-02-01 VITALS — BP 212/87 | HR 78 | Temp 97.9°F | Resp 18

## 2022-02-01 DIAGNOSIS — Z7901 Long term (current) use of anticoagulants: Secondary | ICD-10-CM | POA: Insufficient documentation

## 2022-02-01 DIAGNOSIS — D509 Iron deficiency anemia, unspecified: Secondary | ICD-10-CM

## 2022-02-01 DIAGNOSIS — Z86718 Personal history of other venous thrombosis and embolism: Secondary | ICD-10-CM | POA: Diagnosis not present

## 2022-02-01 DIAGNOSIS — Z86711 Personal history of pulmonary embolism: Secondary | ICD-10-CM | POA: Diagnosis not present

## 2022-02-01 DIAGNOSIS — D649 Anemia, unspecified: Secondary | ICD-10-CM

## 2022-02-01 LAB — CMP (CANCER CENTER ONLY)
ALT: 11 U/L (ref 0–44)
AST: 12 U/L — ABNORMAL LOW (ref 15–41)
Albumin: 3.4 g/dL — ABNORMAL LOW (ref 3.5–5.0)
Alkaline Phosphatase: 62 U/L (ref 38–126)
Anion gap: 10 (ref 5–15)
BUN: 28 mg/dL — ABNORMAL HIGH (ref 8–23)
CO2: 28 mmol/L (ref 22–32)
Calcium: 9.3 mg/dL (ref 8.9–10.3)
Chloride: 101 mmol/L (ref 98–111)
Creatinine: 0.54 mg/dL (ref 0.44–1.00)
GFR, Estimated: >60 mL/min (ref >60)
Glucose, Bld: 182 mg/dL — ABNORMAL HIGH (ref 70–99)
Potassium: 4 mmol/L (ref 3.5–5.1)
Sodium: 139 mmol/L (ref 135–145)
Total Bilirubin: 0.3 mg/dL (ref 0.3–1.2)
Total Protein: 6.8 g/dL (ref 6.5–8.1)

## 2022-02-01 LAB — LACTATE DEHYDROGENASE: LDH: 159 U/L (ref 98–192)

## 2022-02-01 MED ORDER — FOLIC ACID 1 MG PO TABS: 1.0000 mg | ORAL_TABLET | Freq: Every day | ORAL | 3 refills | Status: DC

## 2022-02-01 NOTE — Progress Notes (Signed)
Pt is nonverbal. Obtained information from caregiver. - MM

## 2022-02-01 NOTE — Progress Notes (Signed)
Hematology and Oncology Follow Up Visit  [name] [id number] [birth year] 86 y.o. 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 She was last seen by Dr. Marin Olp in April 2020. Patient was able to give her name and birthday but unable to give any other information. Health history obtained from caregiver.  She states that patient has been more fatigue and had some episodes of syncope without fall.  We had a difficult time getting labs today and were only able to obtain the CMP and LDH.  Iron saturation several weeks ago was 15% and ferritin 11.  They have not noted any obvious blood loss. No abnormal bruising, no petechiae.  Hands are contracted. She does not walk and spends time either in her chair or in bed.  She has a chronic open wound to the sacrum and right hip. These are dressed regularly by wound care.  She has maintained a good appetite and is able to take pills if they are able to be crushed. She is doing her best to stay well hydrated. Unable to stand for weight.  No new thrombotic events since we last saw her.   ECOG Performance Status: 1 - Symptomatic but completely ambulatory  Medications:  Allergies as of 02/01/2022   No Known Allergies      Medication List        Accurate as of February 01, 2022  3:45 PM. If you have any questions, ask your nurse or doctor.          A&D Oint Apply 1 application topically as needed.   Accu-Chek Guide Control Liqd Use for blood sugar machine testing. Dx: E11.42   Accu-Chek Guide test strip Generic drug: glucose blood Use to test blood sugar twice daily. Dx: E11.42   Accu-Chek Guide w/Device Kit Use to  test blood sugar twice daily. Dx: E11.9   Accu-Chek Softclix Lancets lancets 1 each by Other route 2 (two) times daily. Use as instructed Dx: E11.9   acetaminophen 500 MG tablet Commonly known as: TYLENOL Take 500 mg by mouth every 8 (eight) hours as needed (for general pain).   atorvastatin 40 MG tablet Commonly known as: LIPITOR TAKE ONE TABLET BY MOUTH EVERY DAY AT 6:00PM - NEED TO KEEP APPOINTMENT BEFORE ANYMORE REFILLS   cloNIDine 0.1 MG tablet Commonly known as: CATAPRES Take 1 tablet (0.1 mg total) by mouth 2 (two) times daily. May take an additional tablet if needed SBP >170   gabapentin 300 MG capsule Commonly known as: NEURONTIN TAKE ONE CAPSULE BY MOUTH TWICE A DAY   losartan 100 MG tablet Commonly known as: Cozaar Take 1 tablet (100 mg total) by mouth daily.   metFORMIN 500 MG tablet Commonly known as: GLUCOPHAGE Take 2 tablets (1,000 mg total) by mouth in the morning and at bedtime.   NON FORMULARY Take 1 capsule by mouth See admin instructions. [employer] - UltraFlora Balance Daily Probiotic Immune Support* capsules- Take 1 capsule by mouth once a day   polyethylene glycol 17 g packet Commonly known as: MIRALAX / GLYCOLAX Take 17 g by mouth daily as needed for mild constipation (MIX AND DRINK).   Rivaroxaban 15 MG Tabs  tablet Commonly known as: Xarelto TAKE ONE TABLET BY MOUTH EVERY DAY WITH SUPPER   SANTYL EX Apply 1 Application topically as needed.   Vitamin D-3 125 MCG (5000 UT) Tabs Take 5,000 Units by mouth daily.   zinc gluconate 50 MG tablet Take 50 mg by mouth daily.        Allergies: No Known Allergies  Past Medical History, Surgical history, Social history, and Family History were reviewed and updated.  Review of Systems: All other 10 point review of systems is negative.   Physical Exam:  vitals were not taken for this visit.   Wt Readings from Last 3 Encounters:  05/04/19 130 lb 1.1 oz (59 kg)  11/02/18 130 lb (59 kg)   01/30/18 130 lb (59 kg)    Ocular: Sclerae unicteric, pupils equal, round and reactive to light Ear-nose-throat: Oropharynx clear, dentition fair Lymphatic: No cervical or supraclavicular adenopathy Lungs no rales or rhonchi, good excursion bilaterally Heart regular rate and rhythm, no murmur appreciated Abd soft, nontender, positive bowel sounds MSK no focal spinal tenderness, no joint edema Neuro: non-focal, well-oriented, appropriate affect Breasts: Deferred   Lab Results  Component Value Date   WBC 3.4 (L) 01/14/2022   HGB 8.7 (L) 01/14/2022   HCT 28.3 (L) 01/14/2022   MCV 80.4 01/14/2022   PLT 267 01/14/2022   Lab Results  Component Value Date   FERRITIN 11 (L) 01/14/2022   IRON 45 01/14/2022   TIBC 301 01/14/2022   UIBC 200 09/03/2018   IRONPCTSAT 15 (L) 01/14/2022   Lab Results  Component Value Date   RETICCTPCT 1.4 09/03/2018   RBC 3.52 (L) 01/14/2022   No results found for: "KPAFRELGTCHN", "LAMBDASER", "KAPLAMBRATIO" No results found for: "IGGSERUM", "IGA", "IGMSERUM" No results found for: "TOTALPROTELP", "ALBUMINELP", "A1GS", "A2GS", "BETS", "BETA2SER", "GAMS", "MSPIKE", "SPEI"   Chemistry      Component Value Date/Time   NA 135 01/14/2022 1106   NA 142 02/28/2017 1059   K 4.5 01/14/2022 1106   K 3.8 02/28/2017 1059   CL 96 (L) 01/14/2022 1106   CL 102 02/28/2017 1059   CO2 26 01/14/2022 1106   CO2 30 02/28/2017 1059   BUN 17 01/14/2022 1106   BUN 16 02/28/2017 1059   CREATININE 0.66 01/14/2022 1106   GLU 140 03/30/2016 0000      Component Value Date/Time   CALCIUM 9.5 01/14/2022 1106   CALCIUM 9.0 02/28/2017 1059   ALKPHOS 58 09/30/2020 1819   ALKPHOS 68 02/28/2017 1059   AST 12 01/14/2022 1106   AST 11 (L) 09/03/2018 1143   ALT 13 01/14/2022 1106   ALT 9 09/03/2018 1143   ALT 14 02/28/2017 1059   BILITOT 0.3 01/14/2022 1106   BILITOT 0.4 09/03/2018 1143       Impression and Plan: [name] is a very pleasant 86 yo African American  female with history of PE and lower extremity DVT with iron deficiency anemia secondary to long term anticoagulation use. We will get her set up for a dose of Monoferric this week and try to draw labs from her IV at that time.  We will also get her onto folic acid 1 mg PO daily.  Follow-up in 6 weeks.   Lottie Dawson, NP 9/26/20233:45 PM

## 2022-02-02 ENCOUNTER — Inpatient Hospital Stay: Payer: Medicare Other

## 2022-02-02 VITALS — BP 215/65 | HR 76 | Temp 97.7°F | Resp 18

## 2022-02-02 DIAGNOSIS — L89224 Pressure ulcer of left hip, stage 4: Secondary | ICD-10-CM | POA: Diagnosis not present

## 2022-02-02 DIAGNOSIS — Z86718 Personal history of other venous thrombosis and embolism: Secondary | ICD-10-CM | POA: Diagnosis not present

## 2022-02-02 DIAGNOSIS — Z7984 Long term (current) use of oral hypoglycemic drugs: Secondary | ICD-10-CM | POA: Diagnosis not present

## 2022-02-02 DIAGNOSIS — D500 Iron deficiency anemia secondary to blood loss (chronic): Secondary | ICD-10-CM

## 2022-02-02 DIAGNOSIS — Z7901 Long term (current) use of anticoagulants: Secondary | ICD-10-CM | POA: Diagnosis not present

## 2022-02-02 DIAGNOSIS — Z86711 Personal history of pulmonary embolism: Secondary | ICD-10-CM | POA: Diagnosis not present

## 2022-02-02 DIAGNOSIS — L89893 Pressure ulcer of other site, stage 3: Secondary | ICD-10-CM | POA: Diagnosis not present

## 2022-02-02 DIAGNOSIS — D509 Iron deficiency anemia, unspecified: Secondary | ICD-10-CM | POA: Diagnosis not present

## 2022-02-02 DIAGNOSIS — L89324 Pressure ulcer of left buttock, stage 4: Secondary | ICD-10-CM | POA: Diagnosis not present

## 2022-02-02 DIAGNOSIS — F0390 Unspecified dementia without behavioral disturbance: Secondary | ICD-10-CM | POA: Diagnosis not present

## 2022-02-02 DIAGNOSIS — E1140 Type 2 diabetes mellitus with diabetic neuropathy, unspecified: Secondary | ICD-10-CM | POA: Diagnosis not present

## 2022-02-02 LAB — IRON AND IRON BINDING CAPACITY (CC-WL,HP ONLY)
Iron: 22 ug/dL — ABNORMAL LOW (ref 28–170)
Saturation Ratios: 7 % — ABNORMAL LOW (ref 10.4–31.8)
TIBC: 309 ug/dL (ref 250–450)
UIBC: 287 ug/dL (ref 148–442)

## 2022-02-02 LAB — FERRITIN: Ferritin: 9 ng/mL — ABNORMAL LOW (ref 11–307)

## 2022-02-02 MED ORDER — SODIUM CHLORIDE 0.9 % IV SOLN
1000.0000 mg | Freq: Once | INTRAVENOUS | Status: AC
  Administered 2022-02-02: 1000 mg via INTRAVENOUS
  Filled 2022-02-02: qty 10

## 2022-02-02 MED ORDER — SODIUM CHLORIDE 0.9 % IV SOLN: Freq: Once | INTRAVENOUS | Status: AC

## 2022-02-02 NOTE — Patient Instructions (Signed)

## 2022-02-08 DIAGNOSIS — L89224 Pressure ulcer of left hip, stage 4: Secondary | ICD-10-CM | POA: Diagnosis not present

## 2022-02-08 DIAGNOSIS — L89893 Pressure ulcer of other site, stage 3: Secondary | ICD-10-CM | POA: Diagnosis not present

## 2022-02-08 DIAGNOSIS — E1140 Type 2 diabetes mellitus with diabetic neuropathy, unspecified: Secondary | ICD-10-CM | POA: Diagnosis not present

## 2022-02-08 DIAGNOSIS — Z7984 Long term (current) use of oral hypoglycemic drugs: Secondary | ICD-10-CM | POA: Diagnosis not present

## 2022-02-08 DIAGNOSIS — L89324 Pressure ulcer of left buttock, stage 4: Secondary | ICD-10-CM | POA: Diagnosis not present

## 2022-02-08 DIAGNOSIS — F0390 Unspecified dementia without behavioral disturbance: Secondary | ICD-10-CM | POA: Diagnosis not present

## 2022-02-16 ENCOUNTER — Other Ambulatory Visit: Payer: Self-pay | Admitting: Nurse Practitioner

## 2022-02-16 ENCOUNTER — Telehealth: Payer: Self-pay

## 2022-02-16 ENCOUNTER — Other Ambulatory Visit: Payer: Self-pay

## 2022-02-16 DIAGNOSIS — I10 Essential (primary) hypertension: Secondary | ICD-10-CM

## 2022-02-16 MED ORDER — CLONIDINE HCL 0.1 MG PO TABS: ORAL_TABLET | ORAL | 0 refills | Status: DC

## 2022-02-16 NOTE — Telephone Encounter (Signed)
Patient daughter called requesting refill of clonidine be sent to [facility] until presciption is received from [facility].

## 2022-02-17 DIAGNOSIS — L89224 Pressure ulcer of left hip, stage 4: Secondary | ICD-10-CM | POA: Diagnosis not present

## 2022-02-17 DIAGNOSIS — E1140 Type 2 diabetes mellitus with diabetic neuropathy, unspecified: Secondary | ICD-10-CM | POA: Diagnosis not present

## 2022-02-17 DIAGNOSIS — L89324 Pressure ulcer of left buttock, stage 4: Secondary | ICD-10-CM | POA: Diagnosis not present

## 2022-02-17 DIAGNOSIS — F0390 Unspecified dementia without behavioral disturbance: Secondary | ICD-10-CM | POA: Diagnosis not present

## 2022-02-17 DIAGNOSIS — L89893 Pressure ulcer of other site, stage 3: Secondary | ICD-10-CM | POA: Diagnosis not present

## 2022-02-17 DIAGNOSIS — Z7984 Long term (current) use of oral hypoglycemic drugs: Secondary | ICD-10-CM | POA: Diagnosis not present

## 2022-02-21 ENCOUNTER — Encounter (HOSPITAL_BASED_OUTPATIENT_CLINIC_OR_DEPARTMENT_OTHER): Payer: Medicare Other | Attending: General Surgery | Admitting: General Surgery

## 2022-02-21 DIAGNOSIS — Z8673 Personal history of transient ischemic attack (TIA), and cerebral infarction without residual deficits: Secondary | ICD-10-CM | POA: Diagnosis not present

## 2022-02-21 DIAGNOSIS — E1140 Type 2 diabetes mellitus with diabetic neuropathy, unspecified: Secondary | ICD-10-CM | POA: Diagnosis not present

## 2022-02-21 DIAGNOSIS — M6281 Muscle weakness (generalized): Secondary | ICD-10-CM | POA: Diagnosis not present

## 2022-02-21 DIAGNOSIS — I10 Essential (primary) hypertension: Secondary | ICD-10-CM | POA: Diagnosis not present

## 2022-02-21 DIAGNOSIS — Z86718 Personal history of other venous thrombosis and embolism: Secondary | ICD-10-CM | POA: Diagnosis not present

## 2022-02-21 DIAGNOSIS — L89220 Pressure ulcer of left hip, unstageable: Secondary | ICD-10-CM | POA: Diagnosis not present

## 2022-02-21 DIAGNOSIS — Z7901 Long term (current) use of anticoagulants: Secondary | ICD-10-CM | POA: Diagnosis not present

## 2022-02-21 DIAGNOSIS — I890 Lymphedema, not elsewhere classified: Secondary | ICD-10-CM | POA: Diagnosis not present

## 2022-02-21 DIAGNOSIS — Z7984 Long term (current) use of oral hypoglycemic drugs: Secondary | ICD-10-CM | POA: Diagnosis not present

## 2022-02-21 DIAGNOSIS — L89223 Pressure ulcer of left hip, stage 3: Secondary | ICD-10-CM | POA: Insufficient documentation

## 2022-02-21 DIAGNOSIS — L89323 Pressure ulcer of left buttock, stage 3: Secondary | ICD-10-CM | POA: Diagnosis not present

## 2022-02-21 DIAGNOSIS — E11622 Type 2 diabetes mellitus with other skin ulcer: Secondary | ICD-10-CM | POA: Insufficient documentation

## 2022-02-21 DIAGNOSIS — F0390 Unspecified dementia without behavioral disturbance: Secondary | ICD-10-CM | POA: Diagnosis not present

## 2022-02-21 DIAGNOSIS — L89893 Pressure ulcer of other site, stage 3: Secondary | ICD-10-CM | POA: Insufficient documentation

## 2022-02-21 DIAGNOSIS — L89892 Pressure ulcer of other site, stage 2: Secondary | ICD-10-CM | POA: Diagnosis not present

## 2022-02-21 DIAGNOSIS — E785 Hyperlipidemia, unspecified: Secondary | ICD-10-CM | POA: Diagnosis not present

## 2022-02-21 DIAGNOSIS — L89324 Pressure ulcer of left buttock, stage 4: Secondary | ICD-10-CM | POA: Diagnosis not present

## 2022-02-21 DIAGNOSIS — K5900 Constipation, unspecified: Secondary | ICD-10-CM | POA: Diagnosis not present

## 2022-02-21 DIAGNOSIS — Z741 Need for assistance with personal care: Secondary | ICD-10-CM | POA: Diagnosis not present

## 2022-02-21 DIAGNOSIS — L89224 Pressure ulcer of left hip, stage 4: Secondary | ICD-10-CM | POA: Diagnosis not present

## 2022-02-21 DIAGNOSIS — Z86711 Personal history of pulmonary embolism: Secondary | ICD-10-CM | POA: Diagnosis not present

## 2022-02-21 NOTE — Progress Notes (Addendum)
MADI, BONFIGLIO ([id number]) [id number] Page 1 of 14 Visit Report for 02/21/2022 Chief Complaint Document Details Patient Name: Date of Service: [name], [name] 02/21/2022 10:15 A M Medical Record Number: [id number] Patient Account Number: [id number] Date of Birth/Sex: Treating RN: Mar 22, 1936 (86 y.o. [name] Primary Care Provider: [name] Other Clinician: Referring Provider: Treating Provider/Extender: Carmie End in Treatment: 75 Information Obtained from: Patient Chief Complaint LEFT buttocks pressure ulcer Electronic Signature(s) Signed: 02/21/2022 8:12:55 AM By: Fredirick Maudlin MD FACS Entered By: Fredirick Maudlin on 02/21/2022 11:12:55 -------------------------------------------------------------------------------- Debridement Details Patient Name: Date of Service: [name] 02/21/2022 10:15 A M Medical Record Number: [id number] Patient Account Number: [id number] Date of Birth/Sex: Treating RN: 10-26-1935 (86 y.o. [name] Primary Care Provider: [name] Other Clinician: Referring Provider: Treating Provider/Extender: Carmie End in Treatment: 72 Debridement Performed for Assessment: Wound #4 Right,Proximal,Lateral Lower Leg Performed By: Physician Fredirick Maudlin, MD Debridement Type: Debridement Level of Consciousness (Pre-procedure): Awake and Alert Pre-procedure Verification/Time Out Yes - 11:01 Taken: Start Time: 11:01 Pain Control: Lidocaine 5% topical ointment T Area Debrided (L x W): otal 1 (cm) x 0.8 (cm) = 0.8 (cm) Tissue and other material debrided: Non-Viable, Slough, Slough Level: Non-Viable Tissue Debridement Description: Selective/Open Wound Instrument: Curette Bleeding: Minimum Hemostasis Achieved: Pressure End Time: 11:03 Procedural Pain: 0 Post Procedural Pain: 0 Response to Treatment: Procedure was tolerated well Level  of Consciousness (Post- Awake and Alert procedure): Post Debridement Measurements of Total Wound Length: (cm) 1 Stage: Category/Stage II Width: (cm) 0.8 Depth: (cm) 0.3 Volume: (cm) 0.188 Character of Wound/Ulcer Post Debridement: Improved Post Procedure Diagnosis MAKAYIA, DUPLESSIS ([id number]) [id number] Page 2 of 14 Same as Pre-procedure Notes Scribed for Dr. Celine Ahr by J.Scotton Electronic Signature(s) Signed: 02/21/2022 8:21:54 AM By: Fredirick Maudlin MD FACS Signed: 02/21/2022 6:02:10 PM By: Dellie Catholic RN Entered By: Dellie Catholic on 02/21/2022 11:14:13 -------------------------------------------------------------------------------- Debridement Details Patient Name: Date of Service: CARMAN, AUXIER 02/21/2022 10:15 A M Medical Record Number: [id number] Patient Account Number: [id number] Date of Birth/Sex: Treating RN: 10/31/1935 (86 y.o. [name] Primary Care Provider: [name] Other Clinician: Referring Provider: Treating Provider/Extender: Carmie End in Treatment: 72 Debridement Performed for Assessment: Wound #6 Left Trochanter Performed By: Physician Fredirick Maudlin, MD Debridement Type: Debridement Level of Consciousness (Pre-procedure): Awake and Alert Pre-procedure Verification/Time Out Yes - 11:01 Taken: Start Time: 11:01 Pain Control: Lidocaine 5% topical ointment T Area Debrided (L x W): otal 0.8 (cm) x 0.6 (cm) = 0.48 (cm) Tissue and other material debrided: Non-Viable, Eschar, Slough, Slough Level: Non-Viable Tissue Debridement Description: Selective/Open Wound Instrument: Curette Bleeding: Minimum Hemostasis Achieved: Pressure End Time: 11:03 Procedural Pain: 0 Post Procedural Pain: 0 Response to Treatment: Procedure was tolerated well Level of Consciousness (Post- Awake and Alert procedure): Post Debridement Measurements of Total Wound Length: (cm) 0.8 Stage:  Unstageable/Unclassified Width: (cm) 0.6 Depth: (cm) 0.5 Volume: (cm) 0.188 Character of Wound/Ulcer Post Debridement: Improved Post Procedure Diagnosis Same as Pre-procedure Notes Scribed for Dr. Celine Ahr by J.Scotton Electronic Signature(s) Signed: 02/21/2022 8:21:54 AM By: Fredirick Maudlin MD FACS Signed: 02/21/2022 6:02:10 PM By: Dellie Catholic RN Entered By: Dellie Catholic on 02/21/2022 11:15:29 Thomasenia Bottoms ([id number]) [id number] Page 3 of 14 -------------------------------------------------------------------------------- Debridement Details Patient Name: Date of Service: JANNEY, PRIEGO 02/21/2022 10:15 A M Medical Record Number: [id number] Patient Account Number: [id number] Date of Birth/Sex: Treating RN: 01-Jul-1935 (86 y.o. [name] Primary Care Provider: [name] Other Clinician: Referring Provider: Treating Provider/Extender:  Fredirick Maudlin [name] Weeks in Treatment: 72 Debridement Performed for Assessment: Wound #2 Left Gluteus Performed By: Physician Fredirick Maudlin, MD Debridement Type: Debridement Level of Consciousness (Pre-procedure): Awake and Alert Pre-procedure Verification/Time Out Yes - 11:01 Taken: Start Time: 11:01 Pain Control: Lidocaine 5% topical ointment T Area Debrided (L x W): otal 4 (cm) x 2.8 (cm) = 11.2 (cm) Tissue and other material debrided: Non-Viable, [place] Level: Non-Viable Tissue Debridement Description: Selective/Open Wound Instrument: Curette Bleeding: Minimum Hemostasis Achieved: Pressure End Time: 11:03 Procedural Pain: 0 Post Procedural Pain: 0 Response to Treatment: Procedure was tolerated well Level of Consciousness (Post- Awake and Alert procedure): Post Debridement Measurements of Total Wound Length: (cm) 4 Stage: Category/Stage III Width: (cm) 2.8 Depth: (cm) 1.9 Volume: (cm) 16.713 Character of Wound/Ulcer Post Debridement: Improved Post  Procedure Diagnosis Same as Pre-procedure Notes Scribed for Dr. Celine Ahr by J.Scotton Electronic Signature(s) Signed: 02/21/2022 8:21:54 AM By: Fredirick Maudlin MD FACS Signed: 02/21/2022 6:02:10 PM By: Dellie Catholic RN Entered By: Dellie Catholic on 02/21/2022 11:15:55 -------------------------------------------------------------------------------- HPI Details Patient Name: Date of Service: [name] 02/21/2022 10:15 A M Medical Record Number: [id number] Patient Account Number: [id number] Date of Birth/Sex: Treating RN: [birth year] (86 y.o. [name] Primary Care Provider: [name] Other Clinician: Referring Provider: Treating Provider/Extender: Carmie End in Treatment: 84 History of Present Illness HPI Description: Admission 5/27 Ms. Shonda Mandarino is an 86 year old female with a past medical history of type 2 diabetes on oral agents, hypertension and dementia that presents today to the clinic for a wound to her right buttocks. Her daughter-in-law is present today and helps provide the history. This issue has been going on for 2 to 3 weeks now. She states she developed an eschar about 1 week ago. She has been taking doxycycline for possible soft tissue infection to the area prescribed by her primary care provider. Patient currently denies any signs of infection. 6/3; patient admitted to the clinic last week. She has a large necrotic area on her left buttock. They use Santyl last week. On arrival this visit she had completely necrotic surface with open to subcutaneous tissue. The surface was completely nonviable. They have been using Santyl. She apparently had been Holcomb, Rudene Christians ([id number]) [id number] Page 4 of 14 on doxycycline which she is just finishing prescribed by her primary doctor for possible soft tissue infection 6/7; patient with a large necrotic wound over her left buttock. Aggressive debridement  last week. Culture of this grew Proteus unfortunately would not of been covered well or at least predictably by the doxycycline that her primary doctor put her on. I have therefore put her on Augmentin suspension 3 times a day. We are using silver alginate as the primary dressing while we deal with the infection. Her daughter has a list of concerns including swallowing difficulties, concerns about aspiration. The patient has advanced dementia. If this is Alzheimer's disease it is at its preterminal stages. I went over that swallowing difficulties are part of what happens in the preterminal stages of Alzheimer's disease. Nevertheless we will family members seems to want to pursue a very aggressive course 6/21 large wound over her left buttock. She has completed the antibiotics I gave her [Augmentin]. We are using silver alginate while we are dealing with the infection and also to help with the drainage In general the wound looks better she has healthy granulation over perhaps 70% of it. Superiorly there is still necrotic debris I did not attempt to debride this  today The patient has advanced dementia. I do not know that she could tolerate a wound VAC. She also has double incontinence which would make that difficult. Nevertheless she has been cared for diligently by her family. She is apparently eating well and may be 2 to 3 hours on this area all day 7/5; 2-week follow-up. She continues with a nice improvement in overall condition of the wound bed. The undermining area has still some adherent surface slough. We have been using silver alginate because of the drainage and possibility of at least superficial wound infection/colonization. Although I said in previous notes I wondered whether she could tolerate the wound VAC they have done so well with this wound at home I do not want to necessarily rule her out for a wound VAC and I am going to direct the start of a trial of wound VAC therapy if this can be  covered by insurance, if we have home health support to change it at all 7/19; we did not get very far with the wound VAC because my original ICD 10 said this was unstageable. As usual this represents a considerable frustration because we would not of ordered a wound VAC if the wound was still unstageable. This currently represents a stage IV staging. This is all the way down through muscle layer. There is a rim of tissue over the bone and no palpable exposed bone but this is a deep wound. Fortunately at present no evidence of infection. We have been using silver collagen with backing wet-to-dry. The wound is really no better today but no worse 8/2; patient presents for 2-week follow-up. She has been using the wound VAC for the past week. There has been some issues with keeping the drape in place however home health comes out to reapply the drape when this happens. Overall there are no issues or complaints today. No signs of infection. 8/17; patient has been using the wound VAC on her wound on the upper left buttock. About 50 to 60% of this area is fully granulated however from about 10-5 o'clock there is still undermining. UNFORTUNATELY she also arrives in clinic today with a wound over her right fibular head. According to her daughter has been there for about 2 weeks. She wonders whether there is an abrasion although no concrete history of this. Given her frail status this is probably most likely a pressure ulcer over a bony prominence [fibular head] 8/31 unfortunately the upper left buttock pressure ulcer stage IV is not as optimistically better as I was hoping. The granulation that I identified on 12/23/20 is no longer adherent at the inferior pole the undermining is quite extensive but there is no palpable bone. No evidence of surrounding infection. There may be some improvement in the undermining measurements We are still dealing with the additional wound we identified 2 weeks ago on the right  fibular head as well we have been using silver alginate here 9/14; 1 month follow-up. She has now had the wound VAC on for probably 2 months. No major change here if she still has the tunneling area at 12:00 and undermining from about 6-12. Surface of the wound does not look healthy. We use MolecuLight to look at the wound surface which actually looked negative however she has extensive immunofluorescence in the wound edge from about 12-6 o'clock this actually extends beyond the visible wound margin by quite amount. 9/27; no major change in the left buttock wound. There is still undermining however no exposed bone.  Granulation looks reasonable. Right fibular head is much smaller We are using Hydrofera Blue on the right fibular head silver alginate on the buttock area 10/12; left buttock wound is large with undermining. I think this is about the same as last time granulation looks reasonable there is no exposed bone. The area on the fibular head is almost completely closed 11/30; left buttock wound large wound with undermining superiorly. As her daughter points out there is some change in the granulation superiorly and more darker red color also some raised areas of hypergranulation. I cannot tell that the area is infected. We are using silver alginate she is changing this daily On the right fibular head the area is just about closed 12/28; the patient's wound over her right fibular head is closed. Her left buttock ulcer looks actually a little better. Measurements are slightly improved. There is no exposed bone. The patient's daughter came in asking me about more aggressive options to attempt to get closure of the subcu wound including surgery. I thought we had mostly agreed that this would be a palliative type setting and that I did not expect this to actually close nevertheless I tried to answer her questions. I do not think the patient is a candidate for a myocutaneous flap. She is simply too  frail and I doubt plastic surgery would consider her a viable candidate. She might be a candidate for an advanced treatment option like Oasis which sometimes can stimulate granulation to get these wounds to gradually close. I told her in order for this to make sense she would need probably a Foley catheter to control the incontinence and unfortunately weekly visits which would be difficult on this patient 1/25; 1 month follow-up. The patient has a large stage IV left buttock ulcer. Substantially deep but no exposed bone. She has advanced dementia. I do not think she is a candidate for anything aggressive and I follow her on a palliative basis. I been over this with her daughter many times. We have been using silver alginate backing ABDs. She has a level 3 surface on her mattress in spite of this she developed some degree of erythema today on the upper left pelvis 2/22; 1 month follow-up. Large left buttock pressure ulcer. We've been using silver alginate as a palliative dressing. Essentially deep wound with no exposed bone. No evidence of surrounding infection. This patient has advanced/preterminal dementia. Her daughter reports that he had a time where she was not eating very well although she seems to be improved lately. she does not appear to be systemically unwell 07/28/2021: 1 month follow-up. The overall wound dimensions are little bit smaller but she does have significant undermining and epibole. They have been using silver alginate in the wound. There is a small amount of slough buildup. The daughter-in-law that is with her today also expressed concern about some discoloration on the patient's left trochanter and right fibular head. 09/08/2021: 6-week follow-up. No significant change to the left buttock ulcer. There is a bit of slough buildup on the surface but it is otherwise clean without significant drainage or odor. The left trochanter looks like it may have partially broken down and then  subsequently healed without intervention; there is some eschar overlying this and when I removed it, there was fresh pink tissue but no opening. On the right fibular head, this site has opened and there are 2 small wounds with a bit of slough accumulation. No odor or drainage. 10/20/2021: 6-week follow-up. No real change to  the left buttock ulcer. The left trochanter is open now. It is fairly superficial but does extend into the fat layer. The right fibular head is clean and limited to the skin. She has a new area of tissue breakdown overlying her sacrum, just medial to her large left buttock ulcer. This looks like it is secondary to shear. 12/01/2021: 6-week follow-up. The left buttock ulcer remains unchanged. There is a light layer of slough and biofilm on the surface. The left trochanter has a heavy layer of eschar on it. The right fibular head wound is deeper today and has heavy slough accumulation. The area of tissue breakdown on her sacrum that was seen at her last visit has healed. GENEAL, HUEBERT ([id number]) [id number] Page 5 of 14 12/24/2021: The patient's daughter-in-law brought her to clinic today, earlier than her usual 6-week interval, because she had a lot more pain and drainage coming from her left trochanter. On inspection, the area is fluctuant and when pressure was applied, frank pus began draining from underneath the fibrinous eschar overlying the wound. The wound on her right fibular head is a little bit smaller but has some nonviable subcutaneous tissue underneath some slough. The sacral wound is unchanged with just a light layer of slough accumulation. 01/12/2022: The culture that I took from the wound on her left trochanter grew out a polymicrobial collection including Proteus mirabilis, strep, Enterococcus faecalis, Escherichia coli and multiple staph species. There was macrolide and tetracycline resistance. I prescribed a 10-day course of Augmentin and  we have been applying topical gentamicin to the wound. She finished her oral antibiotics. Today, her sacral wound is unchanged. The left trochanter wound is substantially cleaner. There is still some slough and nonviable subcutaneous tissue present. The wound on her right fibular head has some dark fibrinous eschar that is fairly densely adherent. The home health nurse had recommended to the patient's daughter-in-law that she apply Santyl to this location. 01/24/2022: The sacral wound is utterly unchanged. The left trochanter wound is cleaner, smaller, and shallower. The wound on the right fibular head continues to have slough accumulate but the fibrinous eschar has been successfully broken down by Santyl. 02/21/2022: The sacral wound remains unchanged. The left trochanter and right fibular head wounds are substantially smaller with just a bit of slough accumulation. Electronic Signature(s) Signed: 02/21/2022 8:13:34 AM By: Fredirick Maudlin MD FACS Entered By: Fredirick Maudlin on 02/21/2022 11:13:34 -------------------------------------------------------------------------------- Physical Exam Details Patient Name: Date of Service: BRIENA, SWINGLER 02/21/2022 10:15 A M Medical Record Number: [id number] Patient Account Number: [id number] Date of Birth/Sex: Treating RN: [birth year] (86 y.o. [name] Primary Care Provider: [name] Other Clinician: Referring Provider: Treating Provider/Extender: Carmie End in Treatment: 19 Constitutional Hypertensive, asymptomatic. . . . No acute distress.Marland Kitchen Respiratory Normal work of breathing on room air.. Notes 02/21/2022: The sacral wound remains unchanged. The left trochanter and right fibular head wounds are substantially smaller with just a bit of slough accumulation. Electronic Signature(s) Signed: 02/21/2022 11:14:06 AM By: Fredirick Maudlin MD FACS Entered By: Fredirick Maudlin on 02/21/2022  11:14:05 -------------------------------------------------------------------------------- Physician Orders Details Patient Name: Date of Service: LAKECHIA, NAY 02/21/2022 10:15 A M Medical Record Number: [id number] Patient Account Number: [id number] Date of Birth/Sex: Treating RN: [birth year] (86 y.o. [name] Primary Care Provider: [name] Other Clinician: Referring Provider: Treating Provider/Extender: Carmie End in Treatment: 97 Verbal / Phone Orders: No Diagnosis Coding ICD-10 Coding Code Description L89.324 Pressure ulcer of left buttock, stage 4  SHERMAINE, RIVET ([id number]) [id number] Page 6 of 14 E11.9 Type 2 diabetes mellitus without complications S28.315 Pressure ulcer of left hip, stage 3 L89.893 Pressure ulcer of other site, stage 3 Follow-up Appointments Return appointment in 1 month. - Dr Celine Ahr Room 3 Monday November 13th at 10:15am Anesthetic (In clinic) Topical Lidocaine 5% applied to wound bed - In clinic Bathing/ Shower/ Hygiene May shower and wash wound with soap and water. - prior to dressing change Off-Loading Low air-loss mattress (Group 2) Turn and reposition every 2 hours Additional Orders / Instructions Follow Nutritious Diet - 100-120g of Protein Non Wound Condition Bilateral Lower Extremities Protect area with: - Bilateral Lateral Knees , and Right Trochanter. Protect with Silicone Foam Borders or ABD pads [facility] wound care orders this week; continue Home Health for wound care. May utilize formulary equivalent dressing for wound treatment orders unless otherwise specified. - Silver Alginate to R leg ; Silver alginate to L trochanter Dakins to gluteus Other Home Health Orders/Instructions: - VVOHYWV Wound Treatment Wound #2 - Gluteus Wound Laterality: Left Cleanser: Soap and Water Every Other Day/30 Days Discharge Instructions: May shower and wash wound with dial  antibacterial soap and water prior to dressing change. Cleanser: Wound Cleanser Every Other Day/30 Days Discharge Instructions: Cleanse the wound with wound cleanser prior to applying a clean dressing using gauze sponges, not tissue or cotton balls. Prim Dressing: Dakin's Solution 0.25%, 16 (oz) (DME) (Generic) Every Other Day/30 Days ary Discharge Instructions: Moisten gauze with Dakin's solution Secondary Dressing: Bordered Gauze, 4x4 in (DME) (Generic) Every Other Day/30 Days Discharge Instructions: Apply over primary dressing as directed. Secondary Dressing: Woven Gauze Sponge, Non-Sterile 4x4 in (DME) (Generic) Every Other Day/30 Days Discharge Instructions: Apply over primary dressing as directed. Wound #4 - Lower Leg Wound Laterality: Right, Lateral, Proximal Cleanser: Soap and Water Every Other Day/30 Days Discharge Instructions: May shower and wash wound with dial antibacterial soap and water prior to dressing change. Cleanser: Wound Cleanser Every Other Day/30 Days Discharge Instructions: Cleanse the wound with wound cleanser prior to applying a clean dressing using gauze sponges, not tissue or cotton balls. Prim Dressing: KerraCel Ag Gelling Fiber Dressing, 2x2 in (silver alginate) (DME) (Generic) Every Other Day/30 Days ary Discharge Instructions: Apply silver alginate to wound bed as instructed Secondary Dressing: Bordered Gauze, 4x4 in (DME) (Generic) Every Other Day/30 Days Discharge Instructions: Apply over primary dressing as directed. Wound #6 - Trochanter Wound Laterality: Left Cleanser: Soap and Water Every Other Day/30 Days Discharge Instructions: May shower and wash wound with dial antibacterial soap and water prior to dressing change. Cleanser: Wound Cleanser Every Other Day/30 Days Discharge Instructions: Cleanse the wound with wound cleanser prior to applying a clean dressing using gauze sponges, not tissue or cotton balls. Prim Dressing: KerraCel Ag Gelling Fiber  Dressing, 2x2 in (silver alginate) (DME) (Generic) Every Other Day/30 Days ary Discharge Instructions: Apply silver alginate to wound bed as instructed Secondary Dressing: Bordered Gauze, 4x4 in (DME) (Generic) Every Other Day/30 Days Discharge Instructions: Apply over primary dressing as directed. Electronic Signature(s) [place], Rudene Christians ([id number]) [id number] Page 7 of 14 Signed: 02/21/2022 8:21:54 AM By: Fredirick Maudlin MD FACS Signed: 02/21/2022 6:02:10 PM By: Dellie Catholic RN Previous Signature: 02/21/2022 11:16:31 AM Version By: Fredirick Maudlin MD FACS Entered By: Dellie Catholic on 02/21/2022 11:21:40 -------------------------------------------------------------------------------- Problem List Details Patient Name: Date of Service: JOBY, HERSHKOWITZ 02/21/2022 10:15 A M Medical Record Number: [id number] Patient Account Number: [id number] Date of Birth/Sex: Treating RN: 09-Sep-1935 (86 y.o. F)  Dellie Catholic Primary Care Provider: [name] Other Clinician: Referring Provider: Treating Provider/Extender: Carmie End in Treatment: 77 Active Problems ICD-10 Encounter Code Description Active Date MDM Diagnosis L89.324 Pressure ulcer of left buttock, stage 4 11/24/2020 No Yes E11.9 Type 2 diabetes mellitus without complications 09/12/4330 No Yes L89.223 Pressure ulcer of left hip, stage 3 07/28/2021 No Yes L89.893 Pressure ulcer of other site, stage 3 07/28/2021 No Yes Inactive Problems ICD-10 Code Description Active Date Inactive Date L89.320 Pressure ulcer of left buttock, unstageable 10/13/2020 10/13/2020 L89.93 Pressure ulcer of unspecified site, stage 3 12/23/2020 12/23/2020 Resolved Problems Electronic Signature(s) Signed: 02/21/2022 8:12:18 AM By: Fredirick Maudlin MD FACS Entered By: Fredirick Maudlin on 02/21/2022 11:12:18 -------------------------------------------------------------------------------- Progress Note  Details Patient Name: Date of Service: [name] 02/21/2022 10:15 A M Medical Record Number: [id number] Patient Account Number: [id number] Date of Birth/Sex: Treating RN: 1935/10/26 (86 y.o. Valarie Cones [place], [place] ([id number]) [id number] Page 8 of 14 Primary Care Provider: [name] Other Clinician: Referring Provider: Treating Provider/Extender: Carmie End in Treatment: 72 Subjective Chief Complaint Information obtained from Patient LEFT buttocks pressure ulcer History of Present Illness (HPI) Admission 5/27 Ms. Marlia Schewe is an 86 year old female with a past medical history of type 2 diabetes on oral agents, hypertension and dementia that presents today to the clinic for a wound to her right buttocks. Her daughter-in-law is present today and helps provide the history. This issue has been going on for 2 to 3 weeks now. She states she developed an eschar about 1 week ago. She has been taking doxycycline for possible soft tissue infection to the area prescribed by her primary care provider. Patient currently denies any signs of infection. 6/3; patient admitted to the clinic last week. She has a large necrotic area on her left buttock. They use Santyl last week. On arrival this visit she had completely necrotic surface with open to subcutaneous tissue. The surface was completely nonviable. They have been using Santyl. She apparently had been on doxycycline which she is just finishing prescribed by her primary doctor for possible soft tissue infection 6/7; patient with a large necrotic wound over her left buttock. Aggressive debridement last week. Culture of this grew Proteus unfortunately would not of been covered well or at least predictably by the doxycycline that her primary doctor put her on. I have therefore put her on Augmentin suspension 3 times a day. We are using silver alginate as the primary dressing  while we deal with the infection. Her daughter has a list of concerns including swallowing difficulties, concerns about aspiration. The patient has advanced dementia. If this is Alzheimer's disease it is at its preterminal stages. I went over that swallowing difficulties are part of what happens in the preterminal stages of Alzheimer's disease. Nevertheless we will family members seems to want to pursue a very aggressive course 6/21 large wound over her left buttock. She has completed the antibiotics I gave her [Augmentin]. We are using silver alginate while we are dealing with the infection and also to help with the drainage In general the wound looks better she has healthy granulation over perhaps 70% of it. Superiorly there is still necrotic debris I did not attempt to debride this today The patient has advanced dementia. I do not know that she could tolerate a wound VAC. She also has double incontinence which would make that difficult. Nevertheless she has been cared for diligently by her family. She is apparently eating well and  may be 2 to 3 hours on this area all day 7/5; 2-week follow-up. She continues with a nice improvement in overall condition of the wound bed. The undermining area has still some adherent surface slough. We have been using silver alginate because of the drainage and possibility of at least superficial wound infection/colonization. Although I said in previous notes I wondered whether she could tolerate the wound VAC they have done so well with this wound at home I do not want to necessarily rule her out for a wound VAC and I am going to direct the start of a trial of wound VAC therapy if this can be covered by insurance, if we have home health support to change it at all 7/19; we did not get very far with the wound VAC because my original ICD 10 said this was unstageable. As usual this represents a considerable frustration because we would not of ordered a wound VAC if the  wound was still unstageable. This currently represents a stage IV staging. This is all the way down through muscle layer. There is a rim of tissue over the bone and no palpable exposed bone but this is a deep wound. Fortunately at present no evidence of infection. We have been using silver collagen with backing wet-to-dry. The wound is really no better today but no worse 8/2; patient presents for 2-week follow-up. She has been using the wound VAC for the past week. There has been some issues with keeping the drape in place however home health comes out to reapply the drape when this happens. Overall there are no issues or complaints today. No signs of infection. 8/17; patient has been using the wound VAC on her wound on the upper left buttock. About 50 to 60% of this area is fully granulated however from about 10-5 o'clock there is still undermining. UNFORTUNATELY she also arrives in clinic today with a wound over her right fibular head. According to her daughter has been there for about 2 weeks. She wonders whether there is an abrasion although no concrete history of this. Given her frail status this is probably most likely a pressure ulcer over a bony prominence [fibular head] 8/31 unfortunately the upper left buttock pressure ulcer stage IV is not as optimistically better as I was hoping. The granulation that I identified on 12/23/20 is no longer adherent at the inferior pole the undermining is quite extensive but there is no palpable bone. No evidence of surrounding infection. There may be some improvement in the undermining measurements We are still dealing with the additional wound we identified 2 weeks ago on the right fibular head as well we have been using silver alginate here 9/14; 1 month follow-up. She has now had the wound VAC on for probably 2 months. No major change here if she still has the tunneling area at 12:00 and undermining from about 6-12. Surface of the wound does not look  healthy. We use MolecuLight to look at the wound surface which actually looked negative however she has extensive immunofluorescence in the wound edge from about 12-6 o'clock this actually extends beyond the visible wound margin by quite amount. 9/27; no major change in the left buttock wound. There is still undermining however no exposed bone. Granulation looks reasonable. oo Right fibular head is much smaller We are using Hydrofera Blue on the right fibular head silver alginate on the buttock area 10/12; left buttock wound is large with undermining. I think this is about the same as last  time granulation looks reasonable there is no exposed bone. The area on the fibular head is almost completely closed 11/30; left buttock wound large wound with undermining superiorly. As her daughter points out there is some change in the granulation superiorly and more darker red color also some raised areas of hypergranulation. I cannot tell that the area is infected. We are using silver alginate she is changing this daily On the right fibular head the area is just about closed 12/28; the patient's wound over her right fibular head is closed. Her left buttock ulcer looks actually a little better. Measurements are slightly improved. There is no exposed bone. The patient's daughter came in asking me about more aggressive options to attempt to get closure of the subcu wound including surgery. I thought we had mostly agreed that this would be a palliative type setting and that I did not expect this to actually close nevertheless I tried to answer her questions. I do not think the patient is a candidate for a myocutaneous flap. She is simply too frail and I doubt plastic surgery would consider her a viable candidate. She might be a candidate for an advanced treatment option like Oasis which sometimes can stimulate granulation to get these wounds to gradually close. I told her in order for this to make sense she  would need probably a Foley catheter to control the incontinence and unfortunately weekly visits which would be difficult on this patient 1/25; 1 month follow-up. The patient has a large stage IV left buttock ulcer. Substantially deep but no exposed bone. She has advanced dementia. I do not think she is a candidate for anything aggressive and I follow her on a palliative basis. I been over this with her daughter many times. We have been using Woodruff, Rudene Christians ([id number]) [id number] Page 9 of 14 silver alginate backing ABDs. She has a level 3 surface on her mattress in spite of this she developed some degree of erythema today on the upper left pelvis 2/22; 1 month follow-up. Large left buttock pressure ulcer. We've been using silver alginate as a palliative dressing. Essentially deep wound with no exposed bone. No evidence of surrounding infection. This patient has advanced/preterminal dementia. Her daughter reports that he had a time where she was not eating very well although she seems to be improved lately. she does not appear to be systemically unwell 07/28/2021: 1 month follow-up. The overall wound dimensions are little bit smaller but she does have significant undermining and epibole. They have been using silver alginate in the wound. There is a small amount of slough buildup. The daughter-in-law that is with her today also expressed concern about some discoloration on the patient's left trochanter and right fibular head. 09/08/2021: 6-week follow-up. No significant change to the left buttock ulcer. There is a bit of slough buildup on the surface but it is otherwise clean without significant drainage or odor. The left trochanter looks like it may have partially broken down and then subsequently healed without intervention; there is some eschar overlying this and when I removed it, there was fresh pink tissue but no opening. On the right fibular head, this site has opened  and there are 2 small wounds with a bit of slough accumulation. No odor or drainage. 10/20/2021: 6-week follow-up. No real change to the left buttock ulcer. The left trochanter is open now. It is fairly superficial but does extend into the fat layer. The right fibular head is clean and limited to the skin. She has  a new area of tissue breakdown overlying her sacrum, just medial to her large left buttock ulcer. This looks like it is secondary to shear. 12/01/2021: 6-week follow-up. The left buttock ulcer remains unchanged. There is a light layer of slough and biofilm on the surface. The left trochanter has a heavy layer of eschar on it. The right fibular head wound is deeper today and has heavy slough accumulation. The area of tissue breakdown on her sacrum that was seen at her last visit has healed. 12/24/2021: The patient's daughter-in-law brought her to clinic today, earlier than her usual 6-week interval, because she had a lot more pain and drainage coming from her left trochanter. On inspection, the area is fluctuant and when pressure was applied, frank pus began draining from underneath the fibrinous eschar overlying the wound. The wound on her right fibular head is a little bit smaller but has some nonviable subcutaneous tissue underneath some slough. The sacral wound is unchanged with just a light layer of slough accumulation. 01/12/2022: The culture that I took from the wound on her left trochanter grew out a polymicrobial collection including Proteus mirabilis, strep, Enterococcus faecalis, Escherichia coli and multiple staph species. There was macrolide and tetracycline resistance. I prescribed a 10-day course of Augmentin and we have been applying topical gentamicin to the wound. She finished her oral antibiotics. Today, her sacral wound is unchanged. The left trochanter wound is substantially cleaner. There is still some slough and nonviable subcutaneous tissue present. The wound on her right  fibular head has some dark fibrinous eschar that is fairly densely adherent. The home health nurse had recommended to the patient's daughter-in-law that she apply Santyl to this location. 01/24/2022: The sacral wound is utterly unchanged. The left trochanter wound is cleaner, smaller, and shallower. The wound on the right fibular head continues to have slough accumulate but the fibrinous eschar has been successfully broken down by Santyl. 02/21/2022: The sacral wound remains unchanged. The left trochanter and right fibular head wounds are substantially smaller with just a bit of slough accumulation. Patient History Unable to Obtain Patient History due to Dementia. Information obtained from Patient. Family History Stroke - Siblings, No family history of Cancer, Diabetes, Heart Disease, Hereditary Spherocytosis, Hypertension, Kidney Disease, Lung Disease, Seizures, Thyroid Problems, Tuberculosis. Social History Never smoker, Marital Status - Divorced, Alcohol Use - Never, Drug Use - No History, Caffeine Use - Never. Medical History Eyes Denies history of Cataracts, Glaucoma, Optic Neuritis Ear/Nose/Mouth/Throat Denies history of Chronic sinus problems/congestion, Middle ear problems Hematologic/Lymphatic Patient has history of Anemia Denies history of Hemophilia, Human Immunodeficiency Virus, Lymphedema, Sickle Cell Disease Respiratory Denies history of Aspiration, Asthma, Chronic Obstructive Pulmonary Disease (COPD), Pneumothorax, Sleep Apnea, Tuberculosis Cardiovascular Patient has history of Deep Vein Thrombosis, Hypertension Denies history of Angina, Arrhythmia, Congestive Heart Failure, Coronary Artery Disease, Hypotension, Myocardial Infarction, Peripheral Arterial Disease, Peripheral Venous Disease, Phlebitis, Vasculitis Gastrointestinal Denies history of Cirrhosis , Colitis, Crohnoos, Hepatitis A, Hepatitis B, Hepatitis C Endocrine Patient has history of Type II Diabetes Denies  history of Type I Diabetes Genitourinary Denies history of End Stage Renal Disease Immunological Denies history of Lupus Erythematosus, Raynaudoos, Scleroderma Integumentary (Skin) Denies history of History of Burn Musculoskeletal Denies history of Gout, Rheumatoid Arthritis, Osteoarthritis, Osteomyelitis Neurologic Patient has history of Dementia Denies history of Neuropathy, Quadriplegia, Paraplegia, Seizure Disorder Oncologic Denies history of Received Chemotherapy, Received Radiation Psychiatric Denies history of Anorexia/bulimia, Confinement Anxiety Hospitalization/Surgery History - cholecystectomy. - vaginal hysterectomy. - vascular surgery. Medical A Surgical History Notes  nd NAVIKA, HOOPES ([id number]) [id number] Page 10 of 14 Respiratory hx PE Cardiovascular atrial septal aneurysm, hyperlipidemia Genitourinary recent UTi Neurologic CVA Objective Constitutional Hypertensive, asymptomatic. No acute distress.. Vitals Time Taken: 9:25 AM, Height: 68 in, Weight: 110 lbs, BMI: 16.7, Pulse: 63 bpm, Respiratory Rate: 18 breaths/min, Blood Pressure: 216/100 mmHg. Respiratory Normal work of breathing on room air.. General Notes: 02/21/2022: The sacral wound remains unchanged. The left trochanter and right fibular head wounds are substantially smaller with just a bit of slough accumulation. Integumentary (Hair, Skin) Wound #2 status is Open. Original cause of wound was Pressure Injury. The date acquired was: 09/18/2020. The wound has been in treatment 72 weeks. The wound is located on the Left Gluteus. The wound measures 4cm length x 2.8cm width x 1.9cm depth; 8.796cm^2 area and 16.713cm^3 volume. There is Fat Layer (Subcutaneous Tissue) exposed. There is undermining starting at 12:00 and ending at 12:00 with a maximum distance of 1.7cm. There is a medium amount of serosanguineous drainage noted. The wound margin is epibole. There is medium (34-66%)  red, pink granulation within the wound bed. There is a medium (34-66%) amount of necrotic tissue within the wound bed including Adherent Slough. The periwound skin appearance had no abnormalities noted for texture. The periwound skin appearance had no abnormalities noted for moisture. The periwound skin appearance had no abnormalities noted for color. Wound #4 status is Open. Original cause of wound was Trauma. The date acquired was: 09/08/2021. The wound has been in treatment 23 weeks. The wound is located on the Right,Proximal,Lateral Lower Leg. The wound measures 1cm length x 0.8cm width x 0.3cm depth; 0.628cm^2 area and 0.188cm^3 volume. There is Fat Layer (Subcutaneous Tissue) exposed. There is undermining starting at 3:00 and ending at 9:00 with a maximum distance of 0.3cm. There is a medium amount of serosanguineous drainage noted. The wound margin is distinct with the outline attached to the wound base. There is medium (34-66%) pink granulation within the wound bed. There is a medium (34-66%) amount of necrotic tissue within the wound bed including Adherent Slough. The periwound skin appearance had no abnormalities noted for texture. The periwound skin appearance had no abnormalities noted for moisture. The periwound skin appearance had no abnormalities noted for color. Periwound temperature was noted as No Abnormality. Wound #6 status is Open. Original cause of wound was Pressure Injury. The date acquired was: 09/17/2021. The wound has been in treatment 17 weeks. The wound is located on the Left Trochanter. The wound measures 0.8cm length x 0.6cm width x 0.5cm depth; 0.377cm^2 area and 0.188cm^3 volume. There is undermining starting at 3:00 and ending at 6:00 with a maximum distance of 0.55cm. There is a medium amount of serosanguineous drainage noted. The wound margin is flat and intact. There is medium (34-66%) pink granulation within the wound bed. There is a medium (34-66%) amount of necrotic  tissue within the wound bed including Eschar and Adherent Slough. The periwound skin appearance had no abnormalities noted for texture. The periwound skin appearance had no abnormalities noted for moisture. The periwound skin appearance did not exhibit: Atrophie Blanche, Cyanosis, Ecchymosis, Hemosiderin Staining, Mottled, Pallor, Rubor, Erythema. Periwound temperature was noted as No Abnormality. Assessment Active Problems ICD-10 Pressure ulcer of left buttock, stage 4 Type 2 diabetes mellitus without complications Pressure ulcer of left hip, stage 3 Pressure ulcer of other site, stage 3 Procedures Wound #2 Pre-procedure diagnosis of Wound #2 is a Pressure Ulcer located on the Left Gluteus . There was a Selective/Open Wound Non-Viable  Tissue Debridement with a total area of 11.2 sq cm performed by Fredirick Maudlin, MD. With the following instrument(s): Curette to remove Non-Viable tissue/material. Material removed includes [facility] after achieving pain control using Lidocaine 5% topical ointment. No specimens were taken. A time out was conducted at 11:01, prior to the start of the procedure. A Minimum amount of bleeding was controlled with Pressure. The procedure was tolerated well with a pain level of 0 throughout and a pain level of 0 following the procedure. Post Debridement Measurements: 4cm length x 2.8cm width x 1.9cm depth; 16.713cm^3 volume. Post debridement Stage noted as Category/Stage III. Character of Wound/Ulcer Post Debridement is improved. Post procedure Diagnosis Wound #2: Same as Pre-Procedure General Notes: Scribed for Dr. Celine Ahr by J.Scotton. KAMIA, INSALACO ([id number]) [id number] Page 11 of 14 Wound #4 Pre-procedure diagnosis of Wound #4 is a Pressure Ulcer located on the Right,Proximal,Lateral Lower Leg . There was a Selective/Open Wound Non-Viable Tissue Debridement with a total area of 0.8 sq cm performed by Fredirick Maudlin, MD. With the  following instrument(s): Curette to remove Non-Viable tissue/material. Material removed includes [facility] after achieving pain control using Lidocaine 5% topical ointment. No specimens were taken. A time out was conducted at 11:01, prior to the start of the procedure. A Minimum amount of bleeding was controlled with Pressure. The procedure was tolerated well with a pain level of 0 throughout and a pain level of 0 following the procedure. Post Debridement Measurements: 1cm length x 0.8cm width x 0.3cm depth; 0.188cm^3 volume. Post debridement Stage noted as Category/Stage II. Character of Wound/Ulcer Post Debridement is improved. Post procedure Diagnosis Wound #4: Same as Pre-Procedure General Notes: Scribed for Dr. Celine Ahr by J.Scotton. Wound #6 Pre-procedure diagnosis of Wound #6 is a Pressure Ulcer located on the Left Trochanter . There was a Selective/Open Wound Non-Viable Tissue Debridement with a total area of 0.48 sq cm performed by Fredirick Maudlin, MD. With the following instrument(s): Curette to remove Non-Viable tissue/material. Material removed includes Eschar and Slough and after achieving pain control using Lidocaine 5% topical ointment. No specimens were taken. A time out was conducted at 11:01, prior to the start of the procedure. A Minimum amount of bleeding was controlled with Pressure. The procedure was tolerated well with a pain level of 0 throughout and a pain level of 0 following the procedure. Post Debridement Measurements: 0.8cm length x 0.6cm width x 0.5cm depth; 0.188cm^3 volume. Post debridement Stage noted as Unstageable/Unclassified. Character of Wound/Ulcer Post Debridement is improved. Post procedure Diagnosis Wound #6: Same as Pre-Procedure General Notes: Scribed for Dr. Celine Ahr by J.Scotton. Plan The following medication(s) was prescribed: Dakin's Solution miscellaneous 0.5 % solution Moistened gauze with solution for sacral dressing changes  starting 02/21/2022 02/21/2022: The sacral wound remains unchanged. The left trochanter and right fibular head wounds are substantially smaller with just a bit of slough accumulation. I used a curette to debride slough from the left trochanter and right fibular head wounds. I also debrided slough off of the sacral wound. I think we can discontinue the use of Santyl. We will continue to apply silver alginate to the trochanter and fibular head wounds. As there is no change to the sacral wound, I want to try Dakin's wet-to-dry dressing changes to see if we can get a healthier and more robust appearance to the tissue. Follow-up in 1 month. Electronic Signature(s) Signed: 02/21/2022 8:17:22 AM By: Fredirick Maudlin MD FACS Entered By: Fredirick Maudlin on 02/21/2022 11:17:21 -------------------------------------------------------------------------------- HxROS Details Patient Name: Date of Service: Kingsley Plan  NT, [facility] 02/21/2022 10:15 A M Medical Record Number: [id number] Patient Account Number: [id number] Date of Birth/Sex: Treating RN: 07-07-1935 (86 y.o. [name] Primary Care Provider: [name] Other Clinician: Referring Provider: Treating Provider/Extender: Carmie End in Treatment: 26 Unable to Obtain Patient History due to Dementia Information Obtained From Patient Eyes Medical History: Negative for: Cataracts; Glaucoma; Optic Neuritis Ear/Nose/Mouth/Throat Medical History: Negative for: Chronic sinus problems/congestion; Middle ear problems JASHAWNA, REEVER ([id number]) [id number] Page 12 of 14 Hematologic/Lymphatic Medical History: Positive for: Anemia Negative for: Hemophilia; Human Immunodeficiency Virus; Lymphedema; Sickle Cell Disease Respiratory Medical History: Negative for: Aspiration; Asthma; Chronic Obstructive Pulmonary Disease (COPD); Pneumothorax; Sleep Apnea; Tuberculosis Past Medical History  Notes: hx PE Cardiovascular Medical History: Positive for: Deep Vein Thrombosis; Hypertension Negative for: Angina; Arrhythmia; Congestive Heart Failure; Coronary Artery Disease; Hypotension; Myocardial Infarction; Peripheral Arterial Disease; Peripheral Venous Disease; Phlebitis; Vasculitis Past Medical History Notes: atrial septal aneurysm, hyperlipidemia Gastrointestinal Medical History: Negative for: Cirrhosis ; Colitis; Crohns; Hepatitis A; Hepatitis B; Hepatitis C Endocrine Medical History: Positive for: Type II Diabetes Negative for: Type I Diabetes Time with diabetes: 15 years Treated with: Oral agents Blood sugar tested every day: Yes Tested : 2 times per day Genitourinary Medical History: Negative for: End Stage Renal Disease Past Medical History Notes: recent UTi Immunological Medical History: Negative for: Lupus Erythematosus; Raynauds; Scleroderma Integumentary (Skin) Medical History: Negative for: History of Burn Musculoskeletal Medical History: Negative for: Gout; Rheumatoid Arthritis; Osteoarthritis; Osteomyelitis Neurologic Medical History: Positive for: Dementia Negative for: Neuropathy; Quadriplegia; Paraplegia; Seizure Disorder Past Medical History Notes: CVA Oncologic Medical History: Negative for: Received Chemotherapy; Received Radiation Psychiatric Medical History: Negative for: Anorexia/bulimia; Confinement Anxiety Immunizations Pneumococcal Vaccine: Received Pneumococcal Vaccination: No HAGEN, TIDD ([id number]) [phone].pdf Page 13 of 14 Implantable Devices None Hospitalization / Surgery History Type of Hospitalization/Surgery cholecystectomy vaginal hysterectomy vascular surgery Family and Social History Cancer: No; Diabetes: No; Heart Disease: No; Hereditary Spherocytosis: No; Hypertension: No; Kidney Disease: No; Lung Disease: No; Seizures: No; Stroke: Yes - Siblings; Thyroid Problems: No;  Tuberculosis: No; Never smoker; Marital Status - Divorced; Alcohol Use: Never; Drug Use: No History; Caffeine Use: Never; Financial Concerns: No; Food, Clothing or Shelter Needs: No; Support System Lacking: No; Transportation Concerns: No Electronic Signature(s) Signed: 02/21/2022 8:21:54 AM By: Fredirick Maudlin MD FACS Signed: 02/21/2022 6:02:10 PM By: Dellie Catholic RN Entered By: Fredirick Maudlin on 02/21/2022 11:13:41 -------------------------------------------------------------------------------- SuperBill Details Patient Name: Date of Service: MARZELLA, MIRACLE 02/21/2022 Medical Record Number: [id number] Patient Account Number: [id number] Date of Birth/Sex: Treating RN: 21-Jan-1936 (86 y.o. [name] Primary Care Provider: [name] Other Clinician: Referring Provider: Treating Provider/Extender: Carmie End in Treatment: 72 Diagnosis Coding ICD-10 Codes Code Description [phone] Pressure ulcer of left buttock, stage 4 E11.9 Type 2 diabetes mellitus without complications X73.532 Pressure ulcer of left hip, stage 3 L89.893 Pressure ulcer of other site, stage 3 Facility Procedures : CPT4 Code: [id number] Description: 41962 - DEBRIDE WOUND 1ST 20 SQ CM OR < ICD-10 Diagnosis Description L89.893 Pressure ulcer of other site, stage 3 L89.324 Pressure ulcer of left buttock, stage 4 L89.223 Pressure ulcer of left hip, stage 3 Modifier: Quantity: 1 Physician Procedures : CPT4 Code Description Modifier [id number] 21194 - WC PHYS LEVEL 4 - EST PT 25 ICD-10 Diagnosis Description L89.324 Pressure ulcer of left buttock, stage 4 L89.223 Pressure ulcer of left hip, stage 3 L89.893 Pressure ulcer of other site, stage 3 E11.9 Type  2 diabetes mellitus without complications Quantity: 1 Electronic  Signature(s) Signed: 02/21/2022 8:18:00 AM By: Fredirick Maudlin MD FACS Entered By: Fredirick Maudlin on 02/21/2022 11:17:59

## 2022-02-21 NOTE — Progress Notes (Signed)
[name], [name] ([id number]) [id number] Page 1 of 9 Visit Report for 02/21/2022 Arrival Information Details Patient Name: Date of Service: [name], [name] 02/21/2022 10:15 A M Medical Record Number: [id number] Patient Account Number: [id number] Date of Birth/Sex: Treating RN: [birth year] (86 y.o. [name] Primary Care Chrishawn Kring: [name] Other Clinician: Referring [name] Talavera: Treating Eugene Isadore/Extender: Carmie End in Treatment: 78 Visit Information History Since Last Visit Added or deleted any medications: No Patient Arrived: Wheel Chair Any new allergies or adverse reactions: No Arrival Time: 09:20 Had a fall or experienced change in No Accompanied By: daughter in law activities of daily living that may affect Transfer Assistance: Manual risk of falls: Patient Identification Verified: Yes Signs or symptoms of abuse/neglect since last visito No Patient Requires Transmission-Based Precautions: No Hospitalized since last visit: No Patient Has Alerts: No Implantable device outside of the clinic excluding No cellular tissue based products placed in the center since last visit: Pain Present Now: No Electronic Signature(s) Signed: 02/21/2022 6:02:10 PM By: Dellie Catholic RN Entered By: Dellie Catholic on 02/21/2022 10:41:46 -------------------------------------------------------------------------------- Encounter Discharge Information Details Patient Name: Date of Service: [name] 02/21/2022 10:15 A M Medical Record Number: [id number] Patient Account Number: [id number] Date of Birth/Sex: Treating RN: 21-Sep-1935 (86 y.o. [name] Primary Care Farzana Koci: [name] Other Clinician: Referring Nazia Rhines: Treating Renisha Cockrum/Extender: Carmie End in Treatment: 65 Encounter Discharge Information Items Post Procedure Vitals Discharge Condition: Stable Temperature (F):  97.9 Ambulatory Status: Wheelchair Pulse (bpm): 63 Discharge Destination: Home Respiratory Rate (breaths/min): 18 Transportation: Private Auto Blood Pressure (mmHg): 216/100 Accompanied By: daughter-in-law Schedule Follow-up Appointment: Yes Clinical Summary of Care: Patient Declined Electronic Signature(s) Signed: 02/21/2022 6:02:10 PM By: Dellie Catholic RN Entered By: Dellie Catholic on 02/21/2022 17:18:41 Thomasenia Bottoms ([id number]) [id number] Page 2 of 9 -------------------------------------------------------------------------------- Lower Extremity Assessment Details Patient Name: Date of Service: [name], [name] 02/21/2022 10:15 A M Medical Record Number: [id number] Patient Account Number: [id number] Date of Birth/Sex: Treating RN: May 20, 1935 (86 y.o. [name] Primary Care Kavian Peters: [name] Other Clinician: Referring Amaan Meyer: Treating Lacresia Darwish/Extender: Carmie End in Treatment: 72 Vascular Assessment Pulses: Dorsalis Pedis Palpable: [Right:Yes] Electronic Signature(s) Signed: 02/21/2022 6:02:10 PM By: Dellie Catholic RN Entered By: Dellie Catholic on 02/21/2022 10:43:51 -------------------------------------------------------------------------------- Multi Wound Chart Details Patient Name: Date of Service: [name] 02/21/2022 10:15 A M Medical Record Number: [id number] Patient Account Number: [id number] Date of Birth/Sex: Treating RN: 04/20/1936 (86 y.o. [name] Primary Care Bahar Shelden: [name] Other Clinician: Referring Daneka Lantigua: Treating Emaline Karnes/Extender: Carmie End in Treatment: 72 Vital Signs Height(in): 47 Pulse(bpm): 65 Weight(lbs): 110 Blood Pressure(mmHg): 216/100 Body Mass Index(BMI): 16.7 Temperature(F): Respiratory Rate(breaths/min): 18 [2:Photos:] [6:No Photos] Left Gluteus Right, Proximal, Lateral Lower Leg Left  Trochanter Wound Location: Pressure Injury Trauma Pressure Injury Wounding Event: Pressure Ulcer Pressure Ulcer Pressure Ulcer Primary Etiology: Anemia, Deep Vein Thrombosis, Anemia, Deep Vein Thrombosis, Anemia, Deep Vein Thrombosis, Comorbid History: Hypertension, Type II Diabetes, Hypertension, Type II Diabetes, Hypertension, Type II Diabetes, Dementia Dementia Dementia 09/18/2020 09/08/2021 09/17/2021 Date Acquired: 72 23 17 Weeks of Treatment: Open Open Open Wound Status: No No No Wound Recurrence: 4x2.8x1.9 1x0.8x0.3 0.8x0.6x0.5 Measurements L x W x D (cm) 8.796 0.628 0.377 A (cm) : rea 16.713 0.188 0.188 Volume (cm) : 78.90% -432.20% -1116.10% % Reduction in Area: -301.20% -1466.70% -6166.70% % Reduction in VolumeThomasenia Bottoms ([id number]) [id number] Page 3 of 9 12 3 3  Starting Position 1 (o'clock): 12 9 6  Ending  Position 1 (o'clock): 1.7 0.3 0.55 Maximum Distance 1 (cm): Yes Yes Yes Undermining: Category/Stage III Category/Stage II Unstageable/Unclassified Classification: Medium Medium Medium Exudate A mount: Serosanguineous Serosanguineous Serosanguineous Exudate Type: red, brown red, brown red, brown Exudate Color: Epibole Distinct, outline attached Flat and Intact Wound Margin: Medium (34-66%) Medium (34-66%) Medium (34-66%) Granulation A mount: Red, Pink Pink Pink Granulation Quality: Medium (34-66%) Medium (34-66%) Medium (34-66%) Necrotic A mount: Adherent Slough Adherent [facility], Adherent Slough Necrotic Tissue: Fat Layer (Subcutaneous Tissue): Yes Fat Layer (Subcutaneous Tissue): Yes Fascia: No Exposed Structures: Fascia: No Fascia: No Fat Layer (Subcutaneous Tissue): No Tendon: No Tendon: No Tendon: No Muscle: No Muscle: No Muscle: No Joint: No Joint: No Joint: No Bone: No Bone: No Bone: No Small (1-33%) Small (1-33%) Small (1-33%) Epithelialization: No Abnormalities Noted No Abnormalities  Noted Excoriation: No Periwound Skin Texture: Induration: No Callus: No Crepitus: No Rash: No Scarring: No No Abnormalities Noted No Abnormalities Noted Maceration: No Periwound Skin Moisture: Dry/Scaly: No No Abnormalities Noted No Abnormalities Noted Atrophie Blanche: No Periwound Skin Color: Cyanosis: No Ecchymosis: No Erythema: No Hemosiderin Staining: No Mottled: No Pallor: No Rubor: No N/A No Abnormality No Abnormality Temperature: Treatment Notes Electronic Signature(s) Signed: 02/21/2022 8:12:28 AM By: Fredirick Maudlin MD FACS Signed: 02/21/2022 6:02:10 PM By: Dellie Catholic RN Entered By: Fredirick Maudlin on 02/21/2022 11:12:28 -------------------------------------------------------------------------------- Multi-Disciplinary Care Plan Details Patient Name: Date of Service: [name] 02/21/2022 10:15 A M Medical Record Number: [id number] Patient Account Number: [id number] Date of Birth/Sex: Treating RN: 1936-02-12 (86 y.o. [name] Primary Care Kengo Sturges: [name] Other Clinician: Referring Burlene Montecalvo: Treating Verity Gilcrest/Extender: Carmie End in Treatment: 90 Active Inactive Wound/Skin Impairment Nursing Diagnoses: Impaired tissue integrity Goals: Patient/caregiver will verbalize understanding of skin care regimen Date Initiated: 10/02/2020 Target Resolution Date: 04/07/2022 Goal Status: Active Ulcer/skin breakdown will have a volume reduction of 30% by week 4 Date Initiated: 10/02/2020 Date Inactivated: 01/06/2021 Target Resolution Date: 01/09/2021 Goal Status: Met Interventions: ESTEPHANIE, HUBBS ([id number]) [phone].pdf Page 4 of 9 Assess patient/caregiver ability to obtain necessary supplies Assess patient/caregiver ability to perform ulcer/skin care regimen upon admission and as needed Assess ulceration(s) every visit Provide education on ulcer and skin care Treatment  Activities: Skin care regimen initiated : 10/02/2020 Topical wound management initiated : 10/02/2020 Notes: 11/10/20 : Goal not yet met, eschar debrided off increasing wound size. Electronic Signature(s) Signed: 02/21/2022 6:02:10 PM By: Dellie Catholic RN Entered By: Dellie Catholic on 02/21/2022 17:17:26 -------------------------------------------------------------------------------- Pain Assessment Details Patient Name: Date of Service: [name], [name] 02/21/2022 10:15 A M Medical Record Number: [id number] Patient Account Number: [id number] Date of Birth/Sex: Treating RN: 1935-11-15 (86 y.o. [name] Primary Care Carlton Sweaney: [name] Other Clinician: Referring Renell Coaxum: Treating Ladasia Sircy/Extender: Carmie End in Treatment: 72 Active Problems Location of Pain Severity and Description of Pain Patient Has Paino No Site Locations Pain Management and Medication Current Pain Management: Electronic Signature(s) Signed: 02/21/2022 6:02:10 PM By: Dellie Catholic RN Entered By: Dellie Catholic on 02/21/2022 10:43:33 Patient/Caregiver Education Details -------------------------------------------------------------------------------- Thomasenia Bottoms ([id number]) [id number] Page 5 of 9 Patient Name: Date of Service: [name], [name] 10/16/2023andnbsp10:15 A M Medical Record Number: [id number] Patient Account Number: [id number] Date of Birth/Gender: Treating RN: Jun 07, 1935 (86 y.o. [name] Primary Care Physician: [name] Other Clinician: Referring Physician: Treating Physician/Extender: Carmie End in Treatment: 38 Education Assessment Education Provided To: Patient Education Topics Provided Wound/Skin Impairment: Methods: Explain/Verbal Responses: Return demonstration correctly Electronic Signature(s) Signed: 02/21/2022  6:02:10 PM By: Dellie Catholic RN Entered By:  Dellie Catholic on 02/21/2022 17:17:39 -------------------------------------------------------------------------------- Wound Assessment Details Patient Name: Date of Service: [name], [name] 02/21/2022 10:15 A M Medical Record Number: [id number] Patient Account Number: [id number] Date of Birth/Sex: Treating RN: 11-Aug-1935 (86 y.o. [name] Primary Care Lexia Vandevender: [name] Other Clinician: Referring Lien Lyman: Treating Allsion Nogales/Extender: Carmie End in Treatment: 72 Wound Status Wound Number: 2 Primary Pressure Ulcer Etiology: Wound Location: Left Gluteus Wound Status: Open Wounding Event: Pressure Injury Comorbid Anemia, Deep Vein Thrombosis, Hypertension, Type II Date Acquired: 09/18/2020 History: Diabetes, Dementia Weeks Of Treatment: 72 Clustered Wound: No Photos Wound Measurements Length: (cm) 4 Width: (cm) 2.8 Depth: (cm) 1.9 Area: (cm) 8.796 Volume: (cm) 16.713 % Reduction in Area: 78.9% % Reduction in Volume: -301.2% Epithelialization: Small (1-33%) Undermining: Yes Starting Position (o'clock): 12 Ending Position (o'clock): 12 Maximum Distance: (cm) 1.7 Wound Description CARALEE, MOREA ([id number]) Classification: Category/Stage III Wound Margin: Epibole Exudate Amount: Medium Exudate Type: Serosanguineous Exudate Color: red, brown [id number] Page 6 of 9 Foul Odor After Cleansing: No Slough/Fibrino Yes Wound Bed Granulation Amount: Medium (34-66%) Exposed Structure Granulation Quality: Red, Pink Fascia Exposed: No Necrotic Amount: Medium (34-66%) Fat Layer (Subcutaneous Tissue) Exposed: Yes Necrotic Quality: Adherent Slough Tendon Exposed: No Muscle Exposed: No Joint Exposed: No Bone Exposed: No Periwound Skin Texture Texture Color No Abnormalities Noted: Yes No Abnormalities Noted: Yes Moisture No Abnormalities Noted: Yes Treatment Notes Wound #2 (Gluteus) Wound Laterality:  Left Cleanser Soap and Water Discharge Instruction: May shower and wash wound with dial antibacterial soap and water prior to dressing change. Wound Cleanser Discharge Instruction: Cleanse the wound with wound cleanser prior to applying a clean dressing using gauze sponges, not tissue or cotton balls. Peri-Wound Care Topical Primary Dressing Dakin's Solution 0.25%, 16 (oz) Discharge Instruction: Moisten gauze with Dakin's solution Secondary Dressing Bordered Gauze, 4x4 in Discharge Instruction: Apply over primary dressing as directed. Woven Gauze Sponge, Non-Sterile 4x4 in Discharge Instruction: Apply over primary dressing as directed. Secured With Compression Wrap Compression Stockings Environmental education officer) Signed: 02/21/2022 6:02:10 PM By: Dellie Catholic RN Entered By: Dellie Catholic on 02/21/2022 10:52:35 -------------------------------------------------------------------------------- Wound Assessment Details Patient Name: Date of Service: [name], [name] 02/21/2022 10:15 A M Medical Record Number: [id number] Patient Account Number: [id number] Date of Birth/Sex: Treating RN: 1936-03-08 (86 y.o. [name] Primary Care Abdias Hickam: [name] Other Clinician: Referring Maleaha Hughett: Treating Rafael Quesada/Extender: Carmie End in Treatment: [address] [place] ([id number]) [id number] Page 7 of 9 Wound Number: 4 Primary Pressure Ulcer Etiology: Wound Location: Right, Proximal, Lateral Lower Leg Wound Status: Open Wounding Event: Trauma Comorbid Anemia, Deep Vein Thrombosis, Hypertension, Type II Date Acquired: 09/08/2021 History: Diabetes, Dementia Weeks Of Treatment: 23 Clustered Wound: No Photos Wound Measurements Length: (cm) 1 Width: (cm) 0.8 Depth: (cm) 0.3 Area: (cm) 0.628 Volume: (cm) 0.188 % Reduction in Area: -432.2% % Reduction in Volume: -1466.7% Epithelialization: Small  (1-33%) Undermining: Yes Starting Position (o'clock): 3 Ending Position (o'clock): 9 Maximum Distance: (cm) 0.3 Wound Description Classification: Category/Stage II Wound Margin: Distinct, outline attached Exudate Amount: Medium Exudate Type: Serosanguineous Exudate Color: red, brown Foul Odor After Cleansing: No Slough/Fibrino No Wound Bed Granulation Amount: Medium (34-66%) Exposed Structure Granulation Quality: Pink Fascia Exposed: No Necrotic Amount: Medium (34-66%) Fat Layer (Subcutaneous Tissue) Exposed: Yes Necrotic Quality: Adherent Slough Tendon Exposed: No Muscle Exposed: No Joint Exposed: No Bone Exposed: No Periwound Skin Texture Texture Color No Abnormalities Noted: Yes No Abnormalities Noted: Yes  Moisture Temperature / Pain No Abnormalities Noted: Yes Temperature: No Abnormality Treatment Notes Wound #4 (Lower Leg) Wound Laterality: Right, Lateral, Proximal Cleanser Soap and Water Discharge Instruction: May shower and wash wound with dial antibacterial soap and water prior to dressing change. Wound Cleanser Discharge Instruction: Cleanse the wound with wound cleanser prior to applying a clean dressing using gauze sponges, not tissue or cotton balls. Peri-Wound Care Topical Primary Dressing KerraCel Ag Gelling Fiber Dressing, 2x2 in (silver alginate) Discharge Instruction: Apply silver alginate to wound bed as instructed Secondary Dressing Bordered Gauze, 4x4 in Discharge Instruction: Apply over primary dressing as directed. [name], [name] ([id number]) [id number] Page 8 of 9 Secured With Compression Wrap Compression Stockings Environmental education officer) Signed: 02/21/2022 6:02:10 PM By: Dellie Catholic RN Entered By: Dellie Catholic on 02/21/2022 10:54:22 -------------------------------------------------------------------------------- Wound Assessment Details Patient Name: Date of Service: [name], [name] 02/21/2022  10:15 A M Medical Record Number: [id number] Patient Account Number: [id number] Date of Birth/Sex: Treating RN: 21-May-1935 (86 y.o. [name] Primary Care Octavia Mottola: [name] Other Clinician: Referring Adaliz Dobis: Treating Carrisa Keller/Extender: Carmie End in Treatment: 72 Wound Status Wound Number: 6 Primary Pressure Ulcer Etiology: Wound Location: Left Trochanter Wound Status: Open Wounding Event: Pressure Injury Comorbid Anemia, Deep Vein Thrombosis, Hypertension, Type II Date Acquired: 09/17/2021 History: Diabetes, Dementia Weeks Of Treatment: 17 Clustered Wound: No Wound Measurements Length: (cm) 0.8 Width: (cm) 0.6 Depth: (cm) 0.5 Area: (cm) 0.377 Volume: (cm) 0.188 % Reduction in Area: -1116.1% % Reduction in Volume: -6166.7% Epithelialization: Small (1-33%) Undermining: Yes Starting Position (o'clock): 3 Ending Position (o'clock): 6 Maximum Distance: (cm) 0.55 Wound Description Classification: Unstageable/Unclassified Wound Margin: Flat and Intact Exudate Amount: Medium Exudate Type: Serosanguineous Exudate Color: red, brown Foul Odor After Cleansing: No Slough/Fibrino Yes Wound Bed Granulation Amount: Medium (34-66%) Exposed Structure Granulation Quality: Pink Fascia Exposed: No Necrotic Amount: Medium (34-66%) Fat Layer (Subcutaneous Tissue) Exposed: No Necrotic Quality: Eschar, Adherent Slough Tendon Exposed: No Muscle Exposed: No Joint Exposed: No Bone Exposed: No Periwound Skin Texture Texture Color No Abnormalities Noted: Yes No Abnormalities Noted: No Atrophie Blanche: No Moisture Cyanosis: No No Abnormalities Noted: Yes Ecchymosis: No Erythema: No Hemosiderin Staining: No Mottled: No Pallor: No Rubor: No [name], [name] ([id number]) [id number] Page 9 of 9 Temperature / Pain Temperature: No Abnormality Treatment Notes Wound #6 (Trochanter) Wound Laterality:  Left Cleanser Soap and Water Discharge Instruction: May shower and wash wound with dial antibacterial soap and water prior to dressing change. Wound Cleanser Discharge Instruction: Cleanse the wound with wound cleanser prior to applying a clean dressing using gauze sponges, not tissue or cotton balls. Peri-Wound Care Topical Primary Dressing KerraCel Ag Gelling Fiber Dressing, 2x2 in (silver alginate) Discharge Instruction: Apply silver alginate to wound bed as instructed Secondary Dressing Bordered Gauze, 4x4 in Discharge Instruction: Apply over primary dressing as directed. Secured With Compression Wrap Compression Stockings Environmental education officer) Signed: 02/21/2022 6:02:10 PM By: Dellie Catholic RN Entered By: Dellie Catholic on 02/21/2022 10:48:20 -------------------------------------------------------------------------------- Vitals Details Patient Name: Date of Service: [name] 02/21/2022 10:15 A M Medical Record Number: [id number] Patient Account Number: [id number] Date of Birth/Sex: Treating RN: 1936-05-02 (86 y.o. [name] Primary Care Kaneisha Ellenberger: [name] Other Clinician: Referring Edrees Valent: Treating Jessia Kief/Extender: Carmie End in Treatment: 72 Vital Signs Time Taken: 09:25 Temperature (F): 97.9 Height (in): 68 Pulse (bpm): 63 Weight (lbs): 110 Respiratory Rate (breaths/min): 18 Body Mass Index (BMI): 16.7 Blood Pressure (mmHg): 216/100 Reference Range: 80 - 120 mg /  dl Electronic Signature(s) Signed: 02/21/2022 6:02:10 PM By: Dellie Catholic RN Entered By: Dellie Catholic on 02/21/2022 13:51:08

## 2022-02-24 DIAGNOSIS — E1140 Type 2 diabetes mellitus with diabetic neuropathy, unspecified: Secondary | ICD-10-CM | POA: Diagnosis not present

## 2022-02-24 DIAGNOSIS — L89324 Pressure ulcer of left buttock, stage 4: Secondary | ICD-10-CM | POA: Diagnosis not present

## 2022-02-24 DIAGNOSIS — L89893 Pressure ulcer of other site, stage 3: Secondary | ICD-10-CM | POA: Diagnosis not present

## 2022-02-24 DIAGNOSIS — Z7984 Long term (current) use of oral hypoglycemic drugs: Secondary | ICD-10-CM | POA: Diagnosis not present

## 2022-02-24 DIAGNOSIS — F0390 Unspecified dementia without behavioral disturbance: Secondary | ICD-10-CM | POA: Diagnosis not present

## 2022-02-24 DIAGNOSIS — L89224 Pressure ulcer of left hip, stage 4: Secondary | ICD-10-CM | POA: Diagnosis not present

## 2022-02-25 ENCOUNTER — Telehealth: Payer: Self-pay | Admitting: Family Medicine

## 2022-02-25 NOTE — Telephone Encounter (Signed)
Returned call to [employer], pt's daughter-in-law. She had reported yesterday episode of poor arousal and finding pt with large pool of saliva in her mouth. She states pt had near syncopal episode which she sometimes has whe having a BM.

## 2022-03-02 DIAGNOSIS — Z7984 Long term (current) use of oral hypoglycemic drugs: Secondary | ICD-10-CM | POA: Diagnosis not present

## 2022-03-02 DIAGNOSIS — L89893 Pressure ulcer of other site, stage 3: Secondary | ICD-10-CM | POA: Diagnosis not present

## 2022-03-02 DIAGNOSIS — F0390 Unspecified dementia without behavioral disturbance: Secondary | ICD-10-CM | POA: Diagnosis not present

## 2022-03-02 DIAGNOSIS — E1140 Type 2 diabetes mellitus with diabetic neuropathy, unspecified: Secondary | ICD-10-CM | POA: Diagnosis not present

## 2022-03-02 DIAGNOSIS — L89224 Pressure ulcer of left hip, stage 4: Secondary | ICD-10-CM | POA: Diagnosis not present

## 2022-03-02 DIAGNOSIS — L89324 Pressure ulcer of left buttock, stage 4: Secondary | ICD-10-CM | POA: Diagnosis not present

## 2022-03-03 ENCOUNTER — Other Ambulatory Visit: Payer: Medicare Other | Admitting: Family Medicine

## 2022-03-03 VITALS — BP 180/80 | HR 84 | Resp 16

## 2022-03-03 DIAGNOSIS — L89322 Pressure ulcer of left buttock, stage 2: Secondary | ICD-10-CM

## 2022-03-03 DIAGNOSIS — R1310 Dysphagia, unspecified: Secondary | ICD-10-CM

## 2022-03-03 DIAGNOSIS — I10 Essential (primary) hypertension: Secondary | ICD-10-CM | POA: Diagnosis not present

## 2022-03-03 DIAGNOSIS — L89154 Pressure ulcer of sacral region, stage 4: Secondary | ICD-10-CM | POA: Diagnosis not present

## 2022-03-03 DIAGNOSIS — D509 Iron deficiency anemia, unspecified: Secondary | ICD-10-CM | POA: Diagnosis not present

## 2022-03-03 DIAGNOSIS — L89892 Pressure ulcer of other site, stage 2: Secondary | ICD-10-CM

## 2022-03-03 DIAGNOSIS — R55 Syncope and collapse: Secondary | ICD-10-CM

## 2022-03-03 NOTE — Progress Notes (Signed)
[facility] Consult Note Telephone: [phone]  Fax: [phone]    Date of encounter: 03/03/22 12:48 PM PATIENT NAME: [name] [address] [place] [address]   [phone] (home)  DOB: [birth year] MRN: [id number] PRIMARY CARE PROVIDER:    [name], NP,  [place] [address] [phone]  REFERRING PROVIDER:   [name], NP [place],  [place] [address] [phone]  RESPONSIBLE PARTY:    Contact Information     Name Relation Home Work Mobile   [place] Relative [phone]  [phone]   [place], [place] [phone]        I connected with  Shaletha Humble 's primary caregiver, Tanikka Bresnan, on 03/03/22 by a telephonic visit as we were unable to establish a good connection for the video visit.    I discussed the limitations of evaluation and management by telemedicine. The patient expressed understanding and agreed to proceed.  Palliative Care was asked to follow this patient by consultation request of  [name], NP to address advance care planning and complex medical decision making. This is a follow up visit.                                   ASSESSMENT, SYMPTOM MANAGEMENT AND PLAN / RECOMMENDATIONS:  Hypertension BP remains elevated. Continue Cozaar 100 mg daily, Clonidine 0.1 mg BID and prn if SBP > 170.  Question if possible improved control with Catapres patch 0.2 mg weekly for more steady state administration vs addition of Amlodipine 5 mg QHS.  2.  Near Syncope/IDA Likely vasovagal reaction with upright posture and having BM with hypotension and irregular heartbeat leading to near syncope Continue slow position change and increase fluid intake. Would recommend iron supplementation, possible Niferex as more easily tolerated  3   Stage 4 pressure ulcer of sacrum stage 2 pressure ulcer of right knee and left ischium Areas of  undermining noted with hypertrophy of uppermost wound edge of sacral decub. Continue to supplement with protein for healing. No evidence of infection for any of the pressure ulcer Continue pressure reduction including offloading/position change and padding of bony prominences where possible Continue daily Dakins to sacral decub, alginate to right knee and left hip.  4.   Swallowing impairment Upright for all intake and for 30-60 minutes after. Alternating bites/sips Agree with pureed consistency   CODE STATUS: DNR MOST as of 08/23/2019: DNR/DNI Limited Additional interventions Antibiotics on case by case basis IV fluids on time limited trial basis No feeding tube    Follow up Palliative Care Visit: Palliative care will continue to follow for complex medical decision making, advance care planning, and clarification of goals. Return 4-6 weeks or prn.    This visit was coded based on time with greater than 50% of time in coordination of care. Time spent on call 20 minutes.  PPS: 30%  HOSPICE ELIGIBILITY/DIAGNOSIS: TBD  Chief Complaint:  Palliative Care is continuing to follow patient for chronic medical management in setting of dementia with recent near syncope.  HISTORY OF PRESENT ILLNESS:  [name] is a 86 y.o. year old female  with dementia, hx of CVA with residual left sided weakness, hx of DVT and PE, HTN, atrial septal aneurysm, sacral decubitus ulcer, Type 2 DM with polyneuropathy, severe protein calorie malnutrition and dysphagia.  Pt remains total care. Blood sugars are fasting  90s-140, after meal in evening si running 150-216.  She has increased her Metformin to 1.5 (750 mg) in am and 2 tabs (1000 mg)at dinner with significant improvement in blood sugars. Pt is being turned frequently and has some skin breakdown on outside of her 1 knee and bilat hip.  They are turning her some on her back at night with a donut pillow to alleviate pressure on her buttocks.  She states  she has been using xeroform on her hip where the skin is just barely open and alginate on her right knee which has reopened.  She goes to the wound care clinic about once every 6 weeks. Currently cleaning wound with Dakin's solution.  Appetite is good. [name] states wound may be slightly smaller but has no drainage or odor. No issues with constipation and was given prune juice which helps with regular BMs. BP 167/88 this am and pt has received BP meds prior to current visit.  [name] states that pt has infrequent episodes with most recent one last week where she was noted to be sitting up to use the bathroom and becomes somewhat unresponsive.  With most recent episode pt had large volume of clear saliva pooling in mouth and irregular heart beat.  With pt's difficulty in swallowing she had changed to giving her mostly pureed foods.  History obtained from review of EMR, discussion with daughter in law Mertie Haslem who is primary caregiver and/or [name].      Latest Ref Rng & Units 01/14/2022   11:06 AM 01/04/2021   11:39 AM 10/16/2020    2:57 PM  CBC  WBC 3.8 - 10.8 Thousand/uL 3.4  3.8  8.1   Hemoglobin 11.7 - 15.5 g/dL 8.7  9.2  8.4   Hematocrit 35.0 - 45.0 % 28.3  31.3  27.1   Platelets 140 - 400 Thousand/uL 267  283  PLATELET CLUMPS NOTED ON SMEAR, UNABLE TO ESTIMATE        Latest Ref Rng & Units 02/01/2022    2:48 PM 01/14/2022   11:06 AM 01/04/2021   11:39 AM  CMP  Glucose 70 - 99 mg/dL 182  235  191   BUN 8 - 23 mg/dL '28  17  29   '$ Creatinine 0.44 - 1.00 mg/dL 0.54  0.66  0.56   Sodium 135 - 145 mmol/L 139  135  139   Potassium 3.5 - 5.1 mmol/L 4.0  4.5  4.7   Chloride 98 - 111 mmol/L 101  96  101   CO2 22 - 32 mmol/L '28  26  29   '$ Calcium 8.9 - 10.3 mg/dL 9.3  9.5  9.7   Total Protein 6.5 - 8.1 g/dL 6.8  7.2  6.7   Total Bilirubin 0.3 - 1.2 mg/dL 0.3  0.3  0.3   Alkaline Phos 38 - 126 U/L 62     AST 15 - 41 U/L '12  12  13   '$ ALT 0 - 44 U/L '11  13  8    '$ Iron/TIBC/Ferritin/  %Sat    Component Value Date/Time   IRON 22 (L) 02/01/2022 1448   IRON 76 02/28/2017 1059   TIBC 309 02/01/2022 1448   TIBC 205 (L) 02/28/2017 1059   FERRITIN 9 (L) 02/01/2022 1449   FERRITIN 104 02/28/2017 1059   IRONPCTSAT 7 (L) 02/01/2022 1448   IRONPCTSAT 15 (L) 01/14/2022 1106    On 01/14/22 HGB A1c was 8.3%.  I reviewed available labs, medications, imaging,  studies and related documents from the EMR.  Records reviewed and summarized above.   ROS provided by daughter-in-law + dysphagia + constipation intermittently + altered level of consciousness + heartbeat irregularity  Physical Exam: Current and past weights: NO weight in the last 2 years Constitutional: NAD General: frail appearing, thin CV: S1S2, RRR, no LE edema Pulmonary: CTAB, no increased work of breathing, no cough, room air Abdomen: normo-active BS + 4 quadrants, soft and non tender MSK: noted sarcopenia of all 4 limbs with muscle atrophy and loss of subcutaneous fat.   Skin: warm and dry, left sacral decub down to muscle layer with undermining of wound circumferentially with hypertrophy of uppermost wound edge with pink wound bed, no odor or slough. Approx 1.5 cm open wound on right lateral leg just inferior to knee without drainage, no visible undermining. Approx 2-3 cm wound of left hip with shallow depth, pinnk wound bed, no slough or odor. Neuro: Left sided weakness. Noted expressive and probable receptive aphasia with difficulty swallowing (non-verbal) Psych: non-anxious affect, A and O to name, unable to assess further Hem/lymph/immuno: no widespread bruising  Thank you for the opportunity to participate in the care of [name].  The palliative care team will continue to follow. Please call our office at [phone] if we can be of additional assistance.   Marijo Conception, FNP -C  COVID-19 PATIENT SCREENING TOOL Asked and negative response unless otherwise noted:   Have you had symptoms of covid,  tested positive or been in contact with someone with symptoms/positive test in the past 5-10 days?  no

## 2022-03-04 ENCOUNTER — Telehealth: Payer: Self-pay | Admitting: *Deleted

## 2022-03-04 NOTE — Telephone Encounter (Signed)
Received fax from [facility] for CMN for Pressure Reducing Support Surface-Air Mattress due to Pressure ulcer of sacral region stage 3.   Form filled out and signed by Janett Billow. Attached last OV note and faxed back to [facility]

## 2022-03-06 ENCOUNTER — Encounter: Payer: Self-pay | Admitting: Family Medicine

## 2022-03-06 DIAGNOSIS — L89892 Pressure ulcer of other site, stage 2: Secondary | ICD-10-CM | POA: Insufficient documentation

## 2022-03-06 DIAGNOSIS — D509 Iron deficiency anemia, unspecified: Secondary | ICD-10-CM | POA: Insufficient documentation

## 2022-03-06 DIAGNOSIS — L89322 Pressure ulcer of left buttock, stage 2: Secondary | ICD-10-CM | POA: Insufficient documentation

## 2022-03-06 DIAGNOSIS — R55 Syncope and collapse: Secondary | ICD-10-CM | POA: Insufficient documentation

## 2022-03-09 DIAGNOSIS — E1140 Type 2 diabetes mellitus with diabetic neuropathy, unspecified: Secondary | ICD-10-CM | POA: Diagnosis not present

## 2022-03-09 DIAGNOSIS — Z7984 Long term (current) use of oral hypoglycemic drugs: Secondary | ICD-10-CM | POA: Diagnosis not present

## 2022-03-09 DIAGNOSIS — L89324 Pressure ulcer of left buttock, stage 4: Secondary | ICD-10-CM | POA: Diagnosis not present

## 2022-03-09 DIAGNOSIS — L89224 Pressure ulcer of left hip, stage 4: Secondary | ICD-10-CM | POA: Diagnosis not present

## 2022-03-09 DIAGNOSIS — L89893 Pressure ulcer of other site, stage 3: Secondary | ICD-10-CM | POA: Diagnosis not present

## 2022-03-09 DIAGNOSIS — F0390 Unspecified dementia without behavioral disturbance: Secondary | ICD-10-CM | POA: Diagnosis not present

## 2022-03-15 DIAGNOSIS — E1140 Type 2 diabetes mellitus with diabetic neuropathy, unspecified: Secondary | ICD-10-CM | POA: Diagnosis not present

## 2022-03-15 DIAGNOSIS — F0390 Unspecified dementia without behavioral disturbance: Secondary | ICD-10-CM | POA: Diagnosis not present

## 2022-03-15 DIAGNOSIS — L89324 Pressure ulcer of left buttock, stage 4: Secondary | ICD-10-CM | POA: Diagnosis not present

## 2022-03-15 DIAGNOSIS — L89893 Pressure ulcer of other site, stage 3: Secondary | ICD-10-CM | POA: Diagnosis not present

## 2022-03-15 DIAGNOSIS — Z7984 Long term (current) use of oral hypoglycemic drugs: Secondary | ICD-10-CM | POA: Diagnosis not present

## 2022-03-15 DIAGNOSIS — L89224 Pressure ulcer of left hip, stage 4: Secondary | ICD-10-CM | POA: Diagnosis not present

## 2022-03-16 ENCOUNTER — Inpatient Hospital Stay: Payer: Medicare Other | Admitting: Family

## 2022-03-16 ENCOUNTER — Inpatient Hospital Stay: Payer: Medicare Other

## 2022-03-21 ENCOUNTER — Encounter (HOSPITAL_BASED_OUTPATIENT_CLINIC_OR_DEPARTMENT_OTHER): Payer: Medicare Other | Attending: General Surgery | Admitting: General Surgery

## 2022-03-21 DIAGNOSIS — E119 Type 2 diabetes mellitus without complications: Secondary | ICD-10-CM | POA: Insufficient documentation

## 2022-03-21 DIAGNOSIS — I10 Essential (primary) hypertension: Secondary | ICD-10-CM | POA: Insufficient documentation

## 2022-03-21 DIAGNOSIS — Z7984 Long term (current) use of oral hypoglycemic drugs: Secondary | ICD-10-CM | POA: Diagnosis not present

## 2022-03-21 DIAGNOSIS — F0390 Unspecified dementia without behavioral disturbance: Secondary | ICD-10-CM | POA: Insufficient documentation

## 2022-03-21 DIAGNOSIS — L89893 Pressure ulcer of other site, stage 3: Secondary | ICD-10-CM | POA: Diagnosis not present

## 2022-03-21 DIAGNOSIS — L89324 Pressure ulcer of left buttock, stage 4: Secondary | ICD-10-CM | POA: Insufficient documentation

## 2022-03-21 DIAGNOSIS — L89223 Pressure ulcer of left hip, stage 3: Secondary | ICD-10-CM | POA: Insufficient documentation

## 2022-03-21 DIAGNOSIS — L89323 Pressure ulcer of left buttock, stage 3: Secondary | ICD-10-CM | POA: Diagnosis not present

## 2022-03-21 DIAGNOSIS — L89892 Pressure ulcer of other site, stage 2: Secondary | ICD-10-CM | POA: Diagnosis not present

## 2022-03-22 DIAGNOSIS — F0390 Unspecified dementia without behavioral disturbance: Secondary | ICD-10-CM | POA: Diagnosis not present

## 2022-03-22 DIAGNOSIS — L89893 Pressure ulcer of other site, stage 3: Secondary | ICD-10-CM | POA: Diagnosis not present

## 2022-03-22 DIAGNOSIS — L89224 Pressure ulcer of left hip, stage 4: Secondary | ICD-10-CM | POA: Diagnosis not present

## 2022-03-22 DIAGNOSIS — L89324 Pressure ulcer of left buttock, stage 4: Secondary | ICD-10-CM | POA: Diagnosis not present

## 2022-03-22 DIAGNOSIS — Z7984 Long term (current) use of oral hypoglycemic drugs: Secondary | ICD-10-CM | POA: Diagnosis not present

## 2022-03-22 DIAGNOSIS — E1140 Type 2 diabetes mellitus with diabetic neuropathy, unspecified: Secondary | ICD-10-CM | POA: Diagnosis not present

## 2022-03-22 NOTE — Progress Notes (Signed)
[name] ([id number]) [id number] Page 1 of 13 Visit Report for 03/21/2022 Chief Complaint Document Details Patient Name: Date of Service: [name], CORDONE 03/21/2022 3:00 PM Medical Record Number: [id number] Patient Account Number: [id number] Date of Birth/Sex: Treating RN: [birth year] (86 y.o. F) Primary Care Provider: [name] Other Clinician: Referring Provider: Treating Provider/Extender: Carmie End in Treatment: 58 Information Obtained from: Patient Chief Complaint LEFT buttocks pressure ulcer Electronic Signature(s) Signed: 03/21/2022 4:10:31 PM By: Fredirick Maudlin MD FACS Entered By: Fredirick Maudlin on 03/21/2022 16:10:30 -------------------------------------------------------------------------------- Debridement Details Patient Name: Date of Service: [name] 03/21/2022 3:00 PM Medical Record Number: [id number] Patient Account Number: [id number] Date of Birth/Sex: Treating RN: Aug 19, 1935 (86 y.o. [name] Primary Care Provider: [name] Other Clinician: Referring Provider: Treating Provider/Extender: Carmie End in Treatment: 76 Debridement Performed for Assessment: Wound #2 Left Gluteus Performed By: Physician Fredirick Maudlin, MD Debridement Type: Debridement Level of Consciousness (Pre-procedure): Awake and Alert Pre-procedure Verification/Time Out Yes - 15:55 Taken: Start Time: 15:55 Pain Control: Lidocaine 5% topical ointment T Area Debrided (L x W): otal 4 (cm) x 2.6 (cm) = 10.4 (cm) Tissue and other material debrided: Non-Viable, Slough, Slough Level: Non-Viable Tissue Debridement Description: Selective/Open Wound Instrument: Curette Bleeding: Minimum Hemostasis Achieved: Pressure Response to Treatment: Procedure was tolerated well Level of Consciousness (Post- Awake and Alert procedure): Post Debridement Measurements of Total  Wound Length: (cm) 4 Stage: Category/Stage III Width: (cm) 2.6 Depth: (cm) 1.3 Volume: (cm) 10.619 Character of Wound/Ulcer Post Debridement: Improved Post Procedure Diagnosis Same as [name] ([id number]) [id number] Page 2 of 13 Notes scribed for Dr. Celine Ahr by Adline Peals, RN Electronic Signature(s) Signed: 03/22/2022 7:39:34 AM By: Fredirick Maudlin MD FACS Signed: 03/22/2022 7:40:26 AM By: Sabas Sous By: Adline Peals on 03/21/2022 15:56:49 -------------------------------------------------------------------------------- Debridement Details Patient Name: Date of Service: [name], [name] 03/21/2022 3:00 PM Medical Record Number: [id number] Patient Account Number: [id number] Date of Birth/Sex: Treating RN: 1936-05-06 (86 y.o. [name] Primary Care Provider: [name] Other Clinician: Referring Provider: Treating Provider/Extender: Carmie End in Treatment: 76 Debridement Performed for Assessment: Wound #4 Right,Proximal,Lateral Lower Leg Performed By: Physician Fredirick Maudlin, MD Debridement Type: Debridement Level of Consciousness (Pre-procedure): Awake and Alert Pre-procedure Verification/Time Out Yes - 15:55 Taken: Start Time: 15:55 Pain Control: Lidocaine 5% topical ointment T Area Debrided (L x W): otal 0.3 (cm) x 0.3 (cm) = 0.09 (cm) Tissue and other material debrided: Non-Viable, Slough, Slough Level: Non-Viable Tissue Debridement Description: Selective/Open Wound Instrument: Curette Bleeding: Minimum Hemostasis Achieved: Pressure Response to Treatment: Procedure was tolerated well Level of Consciousness (Post- Awake and Alert procedure): Post Debridement Measurements of Total Wound Length: (cm) 0.1 Stage: Category/Stage II Width: (cm) 0.1 Depth: (cm) 0.1 Volume: (cm) 0.001 Character of Wound/Ulcer Post Debridement: Improved Post  Procedure Diagnosis Same as Pre-procedure Notes scribed for Dr. Celine Ahr by Adline Peals, RN Electronic Signature(s) Signed: 03/22/2022 7:39:34 AM By: Fredirick Maudlin MD FACS Signed: 03/22/2022 7:40:26 AM By: Adline Peals Entered By: Adline Peals on 03/21/2022 15:59:38 -------------------------------------------------------------------------------- HPI Details Patient Name: Date of Service: [name] 03/21/2022 3:00 PM Medical Record Number: [id number] Patient Account Number: [id number] Date of Birth/Sex: Treating RN: 17-Aug-1935 (86 y.o. F) Primary Care Provider: [name] Other Clinician: Thomasenia Bottoms ([id number]) [id number] Page 3 of 13 Referring Provider: Treating Provider/Extender: Carmie End in Treatment: 41 History of Present Illness HPI Description: Admission 5/27 Ms. Sherre Contreras is an 86 year old female with a past  medical history of type 2 diabetes on oral agents, hypertension and dementia that presents today to the clinic for a wound to her right buttocks. Her daughter-in-law is present today and helps provide the history. This issue has been going on for 2 to 3 weeks now. She states she developed an eschar about 1 week ago. She has been taking doxycycline for possible soft tissue infection to the area prescribed by her primary care provider. Patient currently denies any signs of infection. 6/3; patient admitted to the clinic last week. She has a large necrotic area on her left buttock. They use Santyl last week. On arrival this visit she had completely necrotic surface with open to subcutaneous tissue. The surface was completely nonviable. They have been using Santyl. She apparently had been on doxycycline which she is just finishing prescribed by her primary doctor for possible soft tissue infection 6/7; patient with a large necrotic wound over her left buttock. Aggressive debridement last  week. Culture of this grew Proteus unfortunately would not of been covered well or at least predictably by the doxycycline that her primary doctor put her on. I have therefore put her on Augmentin suspension 3 times a day. We are using silver alginate as the primary dressing while we deal with the infection. Her daughter has a list of concerns including swallowing difficulties, concerns about aspiration. The patient has advanced dementia. If this is Alzheimer's disease it is at its preterminal stages. I went over that swallowing difficulties are part of what happens in the preterminal stages of Alzheimer's disease. Nevertheless we will family members seems to want to pursue a very aggressive course 6/21 large wound over her left buttock. She has completed the antibiotics I gave her [Augmentin]. We are using silver alginate while we are dealing with the infection and also to help with the drainage In general the wound looks better she has healthy granulation over perhaps 70% of it. Superiorly there is still necrotic debris I did not attempt to debride this today The patient has advanced dementia. I do not know that she could tolerate a wound VAC. She also has double incontinence which would make that difficult. Nevertheless she has been cared for diligently by her family. She is apparently eating well and may be 2 to 3 hours on this area all day 7/5; 2-week follow-up. She continues with a nice improvement in overall condition of the wound bed. The undermining area has still some adherent surface slough. We have been using silver alginate because of the drainage and possibility of at least superficial wound infection/colonization. Although I said in previous notes I wondered whether she could tolerate the wound VAC they have done so well with this wound at home I do not want to necessarily rule her out for a wound VAC and I am going to direct the start of a trial of wound VAC therapy if this can be  covered by insurance, if we have home health support to change it at all 7/19; we did not get very far with the wound VAC because my original ICD 10 said this was unstageable. As usual this represents a considerable frustration because we would not of ordered a wound VAC if the wound was still unstageable. This currently represents a stage IV staging. This is all the way down through muscle layer. There is a rim of tissue over the bone and no palpable exposed bone but this is a deep wound. Fortunately at present no evidence of infection. We have  been using silver collagen with backing wet-to-dry. The wound is really no better today but no worse 8/2; patient presents for 2-week follow-up. She has been using the wound VAC for the past week. There has been some issues with keeping the drape in place however home health comes out to reapply the drape when this happens. Overall there are no issues or complaints today. No signs of infection. 8/17; patient has been using the wound VAC on her wound on the upper left buttock. About 50 to 60% of this area is fully granulated however from about 10-5 o'clock there is still undermining. UNFORTUNATELY she also arrives in clinic today with a wound over her right fibular head. According to her daughter has been there for about 2 weeks. She wonders whether there is an abrasion although no concrete history of this. Given her frail status this is probably most likely a pressure ulcer over a bony prominence [fibular head] 8/31 unfortunately the upper left buttock pressure ulcer stage IV is not as optimistically better as I was hoping. The granulation that I identified on 12/23/20 is no longer adherent at the inferior pole the undermining is quite extensive but there is no palpable bone. No evidence of surrounding infection. There may be some improvement in the undermining measurements We are still dealing with the additional wound we identified 2 weeks ago on the right  fibular head as well we have been using silver alginate here 9/14; 1 month follow-up. She has now had the wound VAC on for probably 2 months. No major change here if she still has the tunneling area at 12:00 and undermining from about 6-12. Surface of the wound does not look healthy. We use MolecuLight to look at the wound surface which actually looked negative however she has extensive immunofluorescence in the wound edge from about 12-6 o'clock this actually extends beyond the visible wound margin by quite amount. 9/27; no major change in the left buttock wound. There is still undermining however no exposed bone. Granulation looks reasonable. Right fibular head is much smaller We are using Hydrofera Blue on the right fibular head silver alginate on the buttock area 10/12; left buttock wound is large with undermining. I think this is about the same as last time granulation looks reasonable there is no exposed bone. The area on the fibular head is almost completely closed 11/30; left buttock wound large wound with undermining superiorly. As her daughter points out there is some change in the granulation superiorly and more darker red color also some raised areas of hypergranulation. I cannot tell that the area is infected. We are using silver alginate she is changing this daily On the right fibular head the area is just about closed 12/28; the patient's wound over her right fibular head is closed. Her left buttock ulcer looks actually a little better. Measurements are slightly improved. There is no exposed bone. The patient's daughter came in asking me about more aggressive options to attempt to get closure of the subcu wound including surgery. I thought we had mostly agreed that this would be a palliative type setting and that I did not expect this to actually close nevertheless I tried to answer her questions. I do not think the patient is a candidate for a myocutaneous flap. She is simply too  frail and I doubt plastic surgery would consider her a viable candidate. She might be a candidate for an advanced treatment option like Oasis which sometimes can stimulate granulation to get these wounds  to gradually close. I told her in order for this to make sense she would need probably a Foley catheter to control the incontinence and unfortunately weekly visits which would be difficult on this patient 1/25; 1 month follow-up. The patient has a large stage IV left buttock ulcer. Substantially deep but no exposed bone. She has advanced dementia. I do not think she is a candidate for anything aggressive and I follow her on a palliative basis. I been over this with her daughter many times. We have been using silver alginate backing ABDs. She has a level 3 surface on her mattress in spite of this she developed some degree of erythema today on the upper left pelvis 2/22; 1 month follow-up. Large left buttock pressure ulcer. We've been using silver alginate as a palliative dressing. Essentially deep wound with no exposed bone. No evidence of surrounding infection. This patient has advanced/preterminal dementia. Her daughter reports that he had a time where she was not eating very well although she seems to be improved lately. she does not appear to be systemically unwell LASHAWNA, POCHE ([id number]) [id number] Page 4 of 13 07/28/2021: 1 month follow-up. The overall wound dimensions are little bit smaller but she does have significant undermining and epibole. They have been using silver alginate in the wound. There is a small amount of slough buildup. The daughter-in-law that is with her today also expressed concern about some discoloration on the patient's left trochanter and right fibular head. 09/08/2021: 6-week follow-up. No significant change to the left buttock ulcer. There is a bit of slough buildup on the surface but it is otherwise clean without significant drainage or  odor. The left trochanter looks like it may have partially broken down and then subsequently healed without intervention; there is some eschar overlying this and when I removed it, there was fresh pink tissue but no opening. On the right fibular head, this site has opened and there are 2 small wounds with a bit of slough accumulation. No odor or drainage. 10/20/2021: 6-week follow-up. No real change to the left buttock ulcer. The left trochanter is open now. It is fairly superficial but does extend into the fat layer. The right fibular head is clean and limited to the skin. She has a new area of tissue breakdown overlying her sacrum, just medial to her large left buttock ulcer. This looks like it is secondary to shear. 12/01/2021: 6-week follow-up. The left buttock ulcer remains unchanged. There is a light layer of slough and biofilm on the surface. The left trochanter has a heavy layer of eschar on it. The right fibular head wound is deeper today and has heavy slough accumulation. The area of tissue breakdown on her sacrum that was seen at her last visit has healed. 12/24/2021: The patient's daughter-in-law brought her to clinic today, earlier than her usual 6-week interval, because she had a lot more pain and drainage coming from her left trochanter. On inspection, the area is fluctuant and when pressure was applied, frank pus began draining from underneath the fibrinous eschar overlying the wound. The wound on her right fibular head is a little bit smaller but has some nonviable subcutaneous tissue underneath some slough. The sacral wound is unchanged with just a light layer of slough accumulation. 01/12/2022: The culture that I took from the wound on her left trochanter grew out a polymicrobial collection including Proteus mirabilis, strep, Enterococcus faecalis, Escherichia coli and multiple staph species. There was macrolide and tetracycline resistance. I prescribed a 10-day course  of Augmentin and we  have been applying topical gentamicin to the wound. She finished her oral antibiotics. Today, her sacral wound is unchanged. The left trochanter wound is substantially cleaner. There is still some slough and nonviable subcutaneous tissue present. The wound on her right fibular head has some dark fibrinous eschar that is fairly densely adherent. The home health nurse had recommended to the patient's daughter-in-law that she apply Santyl to this location. 01/24/2022: The sacral wound is utterly unchanged. The left trochanter wound is cleaner, smaller, and shallower. The wound on the right fibular head continues to have slough accumulate but the fibrinous eschar has been successfully broken down by Santyl. 02/21/2022: The sacral wound remains unchanged. The left trochanter and right fibular head wounds are substantially smaller with just a bit of slough accumulation. 03/21/2022: The sacral wound has contracted somewhat and has less undermining. There is a layer of slough on the wound surface. The left trochanter wound is down to just a tiny pinhole and the right fibular head has also contracted considerably. There is some slough and eschar on the surface. Electronic Signature(s) Signed: 03/21/2022 4:11:27 PM By: Fredirick Maudlin MD FACS Entered By: Fredirick Maudlin on 03/21/2022 16:11:26 -------------------------------------------------------------------------------- Physical Exam Details Patient Name: Date of Service: [name], [name] 03/21/2022 3:00 PM Medical Record Number: [id number] Patient Account Number: [id number] Date of Birth/Sex: Treating RN: 03/28/1936 (86 y.o. F) Primary Care Provider: [name] Other Clinician: Referring Provider: Treating Provider/Extender: Carmie End in Treatment: 53 Constitutional Hypertensive, asymptomatic. . . . No acute distress.. Notes 03/21/2022: The sacral wound has contracted somewhat and has less undermining. There is a  layer of slough on the wound surface. The left trochanter wound is down to just a tiny pinhole and the right fibular head has also contracted considerably. There is some slough and eschar on the surface. Electronic Signature(s) Signed: 03/21/2022 4:12:44 PM By: Fredirick Maudlin MD FACS Entered By: Fredirick Maudlin on 03/21/2022 16:12:44 -------------------------------------------------------------------------------- Physician Orders Details Patient Name: Date of Service: [name] 03/21/2022 3:00 PM Thomasenia Bottoms ([id number]) [id number] Page 5 of 13 Medical Record Number: [id number] Patient Account Number: [id number] Date of Birth/Sex: Treating RN: 12/05/1935 (86 y.o. [name] Primary Care Provider: [name] Other Clinician: Referring Provider: Treating Provider/Extender: Carmie End in Treatment: 31 Verbal / Phone Orders: No Diagnosis Coding ICD-10 Coding Code Description [phone] Pressure ulcer of left buttock, stage 4 E11.9 Type 2 diabetes mellitus without complications P37.902 Pressure ulcer of left hip, stage 3 L89.893 Pressure ulcer of other site, stage 3 Follow-up Appointments ppointment in: - 6 weeks Return A Anesthetic (In clinic) Topical Lidocaine 5% applied to wound bed - In clinic Bathing/ Shower/ Hygiene May shower and wash wound with soap and water. - prior to dressing change Off-Loading Low air-loss mattress (Group 2) Turn and reposition every 2 hours Additional Orders / Instructions Follow Nutritious Diet - 100-120g of Protein Non Wound Condition Bilateral Lower Extremities Protect area with: - Bilateral Lateral Knees , and Right Trochanter. Protect with Silicone Foam Borders or ABD pads Tatums wound care orders this week; continue Home Health for wound care. May utilize formulary equivalent dressing for wound treatment orders unless otherwise specified. - Silver  Alginate to R leg ; Silver alginate to L trochanter Dakins to gluteus Other Home Health Orders/Instructions: - IOXBDZH Wound Treatment Wound #2 - Gluteus Wound Laterality: Left Cleanser: Soap and Water Every Other Day/30 Days Discharge Instructions: May shower and wash wound with dial  antibacterial soap and water prior to dressing change. Cleanser: Wound Cleanser Every Other Day/30 Days Discharge Instructions: Cleanse the wound with wound cleanser prior to applying a clean dressing using gauze sponges, not tissue or cotton balls. Prim Dressing: Dakin's Solution 0.25%, 16 (oz) (Generic) Every Other Day/30 Days ary Discharge Instructions: Moisten gauze with Dakin's solution Secondary Dressing: Bordered Gauze, 4x4 in (Generic) Every Other Day/30 Days Discharge Instructions: Apply over primary dressing as directed. Secondary Dressing: Woven Gauze Sponge, Non-Sterile 4x4 in (Generic) Every Other Day/30 Days Discharge Instructions: Apply over primary dressing as directed. Wound #4 - Lower Leg Wound Laterality: Right, Lateral, Proximal Cleanser: Soap and Water Every Other Day/30 Days Discharge Instructions: May shower and wash wound with dial antibacterial soap and water prior to dressing change. Cleanser: Wound Cleanser Every Other Day/30 Days Discharge Instructions: Cleanse the wound with wound cleanser prior to applying a clean dressing using gauze sponges, not tissue or cotton balls. Prim Dressing: KerraCel Ag Gelling Fiber Dressing, 2x2 in (silver alginate) (Generic) Every Other Day/30 Days ary Discharge Instructions: Apply silver alginate to wound bed as instructed Secondary Dressing: Bordered Gauze, 4x4 in (Generic) Every Other Day/30 Days Discharge Instructions: Apply over primary dressing as directed. Wound #6 - Trochanter Wound Laterality: Left Cleanser: Soap and Water Every Other Day/30 Days Discharge Instructions: May shower and wash wound with dial antibacterial soap and water prior  to dressing change. KEYUNNA, COCO ([id number]) [id number] Page 6 of 13 Cleanser: Wound Cleanser Every Other Day/30 Days Discharge Instructions: Cleanse the wound with wound cleanser prior to applying a clean dressing using gauze sponges, not tissue or cotton balls. Secondary Dressing: Bordered Gauze, 4x4 in (Generic) Every Other Day/30 Days Discharge Instructions: Apply over primary dressing as directed. Patient Medications llergies: No Known Allergies A Notifications Medication Indication Start End 03/21/2022 lidocaine DOSE topical 5 % ointment - ointment topical Electronic Signature(s) Signed: 03/22/2022 7:39:34 AM By: Fredirick Maudlin MD FACS Entered By: Fredirick Maudlin on 03/21/2022 16:13:46 -------------------------------------------------------------------------------- Problem List Details Patient Name: Date of Service: [name] 03/21/2022 3:00 PM Medical Record Number: [id number] Patient Account Number: [id number] Date of Birth/Sex: Treating RN: 12-19-1935 (86 y.o. F) Primary Care Provider: [name] Other Clinician: Referring Provider: Treating Provider/Extender: Carmie End in Treatment: 36 Active Problems ICD-10 Encounter Code Description Active Date MDM Diagnosis L89.324 Pressure ulcer of left buttock, stage 4 11/24/2020 No Yes E11.9 Type 2 diabetes mellitus without complications 10/11/352 No Yes L89.223 Pressure ulcer of left hip, stage 3 07/28/2021 No Yes L89.893 Pressure ulcer of other site, stage 3 07/28/2021 No Yes Inactive Problems ICD-10 Code Description Active Date Inactive Date L89.320 Pressure ulcer of left buttock, unstageable 10/13/2020 10/13/2020 L89.93 Pressure ulcer of unspecified site, stage 3 12/23/2020 12/23/2020 Resolved Problems HENDEL, GATLIFF ([id number]) [id number] Page 7 of 13 Electronic Signature(s) Signed: 03/21/2022 4:09:33 PM By: Fredirick Maudlin MD  FACS Entered By: Fredirick Maudlin on 03/21/2022 16:09:33 -------------------------------------------------------------------------------- Progress Note Details Patient Name: Date of Service: [name], [name] 03/21/2022 3:00 PM Medical Record Number: [id number] Patient Account Number: [id number] Date of Birth/Sex: Treating RN: 1935-10-08 (86 y.o. F) Primary Care Provider: [name] Other Clinician: Referring Provider: Treating Provider/Extender: Carmie End in Treatment: 17 Subjective Chief Complaint Information obtained from Patient LEFT buttocks pressure ulcer History of Present Illness (HPI) Admission 5/27 Ms. Temeca Somma is an 86 year old female with a past medical history of type 2 diabetes on oral agents, hypertension and dementia that presents today to the clinic for a wound to her right  buttocks. Her daughter-in-law is present today and helps provide the history. This issue has been going on for 2 to 3 weeks now. She states she developed an eschar about 1 week ago. She has been taking doxycycline for possible soft tissue infection to the area prescribed by her primary care provider. Patient currently denies any signs of infection. 6/3; patient admitted to the clinic last week. She has a large necrotic area on her left buttock. They use Santyl last week. On arrival this visit she had completely necrotic surface with open to subcutaneous tissue. The surface was completely nonviable. They have been using Santyl. She apparently had been on doxycycline which she is just finishing prescribed by her primary doctor for possible soft tissue infection 6/7; patient with a large necrotic wound over her left buttock. Aggressive debridement last week. Culture of this grew Proteus unfortunately would not of been covered well or at least predictably by the doxycycline that her primary doctor put her on. I have therefore put her on Augmentin suspension 3 times a  day. We are using silver alginate as the primary dressing while we deal with the infection. Her daughter has a list of concerns including swallowing difficulties, concerns about aspiration. The patient has advanced dementia. If this is Alzheimer's disease it is at its preterminal stages. I went over that swallowing difficulties are part of what happens in the preterminal stages of Alzheimer's disease. Nevertheless we will family members seems to want to pursue a very aggressive course 6/21 large wound over her left buttock. She has completed the antibiotics I gave her [Augmentin]. We are using silver alginate while we are dealing with the infection and also to help with the drainage In general the wound looks better she has healthy granulation over perhaps 70% of it. Superiorly there is still necrotic debris I did not attempt to debride this today The patient has advanced dementia. I do not know that she could tolerate a wound VAC. She also has double incontinence which would make that difficult. Nevertheless she has been cared for diligently by her family. She is apparently eating well and may be 2 to 3 hours on this area all day 7/5; 2-week follow-up. She continues with a nice improvement in overall condition of the wound bed. The undermining area has still some adherent surface slough. We have been using silver alginate because of the drainage and possibility of at least superficial wound infection/colonization. Although I said in previous notes I wondered whether she could tolerate the wound VAC they have done so well with this wound at home I do not want to necessarily rule her out for a wound VAC and I am going to direct the start of a trial of wound VAC therapy if this can be covered by insurance, if we have home health support to change it at all 7/19; we did not get very far with the wound VAC because my original ICD 10 said this was unstageable. As usual this represents a considerable  frustration because we would not of ordered a wound VAC if the wound was still unstageable. This currently represents a stage IV staging. This is all the way down through muscle layer. There is a rim of tissue over the bone and no palpable exposed bone but this is a deep wound. Fortunately at present no evidence of infection. We have been using silver collagen with backing wet-to-dry. The wound is really no better today but no worse 8/2; patient presents for 2-week follow-up. She  has been using the wound VAC for the past week. There has been some issues with keeping the drape in place however home health comes out to reapply the drape when this happens. Overall there are no issues or complaints today. No signs of infection. 8/17; patient has been using the wound VAC on her wound on the upper left buttock. About 50 to 60% of this area is fully granulated however from about 10-5 o'clock there is still undermining. UNFORTUNATELY she also arrives in clinic today with a wound over her right fibular head. According to her daughter has been there for about 2 weeks. She wonders whether there is an abrasion although no concrete history of this. Given her frail status this is probably most likely a pressure ulcer over a bony prominence [fibular head] 8/31 unfortunately the upper left buttock pressure ulcer stage IV is not as optimistically better as I was hoping. The granulation that I identified on 12/23/20 is no longer adherent at the inferior pole the undermining is quite extensive but there is no palpable bone. No evidence of surrounding infection. There may be some improvement in the undermining measurements We are still dealing with the additional wound we identified 2 weeks ago on the right fibular head as well we have been using silver alginate here 9/14; 1 month follow-up. She has now had the wound VAC on for probably 2 months. No major change here if she still has the tunneling area at 12:00  and undermining from about 6-12. Surface of the wound does not look healthy. We use MolecuLight to look at the wound surface which actually looked negative however she has extensive immunofluorescence in the wound edge from about 12-6 o'clock this actually extends beyond the visible wound margin by quite amount. 9/27; no major change in the left buttock wound. There is still undermining however no exposed bone. Granulation looks reasonable. oo Right fibular head is much smaller We are using Hydrofera Blue on the right fibular head silver alginate on the buttock area [place], Rudene Christians ([id number]) [id number] Page 8 of 13 10/12; left buttock wound is large with undermining. I think this is about the same as last time granulation looks reasonable there is no exposed bone. The area on the fibular head is almost completely closed 11/30; left buttock wound large wound with undermining superiorly. As her daughter points out there is some change in the granulation superiorly and more darker red color also some raised areas of hypergranulation. I cannot tell that the area is infected. We are using silver alginate she is changing this daily On the right fibular head the area is just about closed 12/28; the patient's wound over her right fibular head is closed. Her left buttock ulcer looks actually a little better. Measurements are slightly improved. There is no exposed bone. The patient's daughter came in asking me about more aggressive options to attempt to get closure of the subcu wound including surgery. I thought we had mostly agreed that this would be a palliative type setting and that I did not expect this to actually close nevertheless I tried to answer her questions. I do not think the patient is a candidate for a myocutaneous flap. She is simply too frail and I doubt plastic surgery would consider her a viable candidate. She might be a candidate for an advanced treatment  option like Oasis which sometimes can stimulate granulation to get these wounds to gradually close. I told her in order for this to make sense she would  need probably a Foley catheter to control the incontinence and unfortunately weekly visits which would be difficult on this patient 1/25; 1 month follow-up. The patient has a large stage IV left buttock ulcer. Substantially deep but no exposed bone. She has advanced dementia. I do not think she is a candidate for anything aggressive and I follow her on a palliative basis. I been over this with her daughter many times. We have been using silver alginate backing ABDs. She has a level 3 surface on her mattress in spite of this she developed some degree of erythema today on the upper left pelvis 2/22; 1 month follow-up. Large left buttock pressure ulcer. We've been using silver alginate as a palliative dressing. Essentially deep wound with no exposed bone. No evidence of surrounding infection. This patient has advanced/preterminal dementia. Her daughter reports that he had a time where she was not eating very well although she seems to be improved lately. she does not appear to be systemically unwell 07/28/2021: 1 month follow-up. The overall wound dimensions are little bit smaller but she does have significant undermining and epibole. They have been using silver alginate in the wound. There is a small amount of slough buildup. The daughter-in-law that is with her today also expressed concern about some discoloration on the patient's left trochanter and right fibular head. 09/08/2021: 6-week follow-up. No significant change to the left buttock ulcer. There is a bit of slough buildup on the surface but it is otherwise clean without significant drainage or odor. The left trochanter looks like it may have partially broken down and then subsequently healed without intervention; there is some eschar overlying this and when I removed it, there was fresh pink  tissue but no opening. On the right fibular head, this site has opened and there are 2 small wounds with a bit of slough accumulation. No odor or drainage. 10/20/2021: 6-week follow-up. No real change to the left buttock ulcer. The left trochanter is open now. It is fairly superficial but does extend into the fat layer. The right fibular head is clean and limited to the skin. She has a new area of tissue breakdown overlying her sacrum, just medial to her large left buttock ulcer. This looks like it is secondary to shear. 12/01/2021: 6-week follow-up. The left buttock ulcer remains unchanged. There is a light layer of slough and biofilm on the surface. The left trochanter has a heavy layer of eschar on it. The right fibular head wound is deeper today and has heavy slough accumulation. The area of tissue breakdown on her sacrum that was seen at her last visit has healed. 12/24/2021: The patient's daughter-in-law brought her to clinic today, earlier than her usual 6-week interval, because she had a lot more pain and drainage coming from her left trochanter. On inspection, the area is fluctuant and when pressure was applied, frank pus began draining from underneath the fibrinous eschar overlying the wound. The wound on her right fibular head is a little bit smaller but has some nonviable subcutaneous tissue underneath some slough. The sacral wound is unchanged with just a light layer of slough accumulation. 01/12/2022: The culture that I took from the wound on her left trochanter grew out a polymicrobial collection including Proteus mirabilis, strep, Enterococcus faecalis, Escherichia coli and multiple staph species. There was macrolide and tetracycline resistance. I prescribed a 10-day course of Augmentin and we have been applying topical gentamicin to the wound. She finished her oral antibiotics. Today, her sacral wound is unchanged.  The left trochanter wound is substantially cleaner. There is still some  slough and nonviable subcutaneous tissue present. The wound on her right fibular head has some dark fibrinous eschar that is fairly densely adherent. The home health nurse had recommended to the patient's daughter-in-law that she apply Santyl to this location. 01/24/2022: The sacral wound is utterly unchanged. The left trochanter wound is cleaner, smaller, and shallower. The wound on the right fibular head continues to have slough accumulate but the fibrinous eschar has been successfully broken down by Santyl. 02/21/2022: The sacral wound remains unchanged. The left trochanter and right fibular head wounds are substantially smaller with just a bit of slough accumulation. 03/21/2022: The sacral wound has contracted somewhat and has less undermining. There is a layer of slough on the wound surface. The left trochanter wound is down to just a tiny pinhole and the right fibular head has also contracted considerably. There is some slough and eschar on the surface. Patient History Unable to Obtain Patient History due to Dementia. Information obtained from Patient. Family History Stroke - Siblings, No family history of Cancer, Diabetes, Heart Disease, Hereditary Spherocytosis, Hypertension, Kidney Disease, Lung Disease, Seizures, Thyroid Problems, Tuberculosis. Social History Never smoker, Marital Status - Divorced, Alcohol Use - Never, Drug Use - No History, Caffeine Use - Never. Medical History Eyes Denies history of Cataracts, Glaucoma, Optic Neuritis Ear/Nose/Mouth/Throat Denies history of Chronic sinus problems/congestion, Middle ear problems Hematologic/Lymphatic Patient has history of Anemia Denies history of Hemophilia, Human Immunodeficiency Virus, Lymphedema, Sickle Cell Disease Respiratory Denies history of Aspiration, Asthma, Chronic Obstructive Pulmonary Disease (COPD), Pneumothorax, Sleep Apnea, Tuberculosis Cardiovascular Patient has history of Deep Vein Thrombosis,  Hypertension Denies history of Angina, Arrhythmia, Congestive Heart Failure, Coronary Artery Disease, Hypotension, Myocardial Infarction, Peripheral Arterial Disease, Peripheral Venous Disease, Phlebitis, Vasculitis MONSERATH, NEFF ([id number]) [id number] Page 9 of 13 Gastrointestinal Denies history of Cirrhosis , Colitis, Crohnoos, Hepatitis A, Hepatitis B, Hepatitis C Endocrine Patient has history of Type II Diabetes Denies history of Type I Diabetes Genitourinary Denies history of End Stage Renal Disease Immunological Denies history of Lupus Erythematosus, Raynaudoos, Scleroderma Integumentary (Skin) Denies history of History of Burn Musculoskeletal Denies history of Gout, Rheumatoid Arthritis, Osteoarthritis, Osteomyelitis Neurologic Patient has history of Dementia Denies history of Neuropathy, Quadriplegia, Paraplegia, Seizure Disorder Oncologic Denies history of Received Chemotherapy, Received Radiation Psychiatric Denies history of Anorexia/bulimia, Confinement Anxiety Hospitalization/Surgery History - cholecystectomy. - vaginal hysterectomy. - vascular surgery. Medical A Surgical History Notes nd Respiratory hx PE Cardiovascular atrial septal aneurysm, hyperlipidemia Genitourinary recent UTi Neurologic CVA Objective Constitutional Hypertensive, asymptomatic. No acute distress.. Vitals Time Taken: 3:30 PM, Height: 68 in, Weight: 110 lbs, BMI: 16.7, Pulse: 60 bpm, Respiratory Rate: 18 breaths/min, Blood Pressure: 205/105 mmHg. General Notes: 03/21/2022: The sacral wound has contracted somewhat and has less undermining. There is a layer of slough on the wound surface. The left trochanter wound is down to just a tiny pinhole and the right fibular head has also contracted considerably. There is some slough and eschar on the surface. Integumentary (Hair, Skin) Wound #2 status is Open. Original cause of wound was Pressure Injury. The date  acquired was: 09/18/2020. The wound has been in treatment 76 weeks. The wound is located on the Left Gluteus. The wound measures 4cm length x 2.6cm width x 1.3cm depth; 8.168cm^2 area and 10.619cm^3 volume. There is Fat Layer (Subcutaneous Tissue) exposed. There is undermining starting at 12:00 and ending at 10:00 with a maximum distance of 2.3cm. There is a medium amount of  serosanguineous drainage noted. The wound margin is epibole. There is medium (34-66%) red, pink granulation within the wound bed. There is a medium (34-66%) amount of necrotic tissue within the wound bed including Adherent Slough. The periwound skin appearance had no abnormalities noted for moisture. The periwound skin appearance had no abnormalities noted for color. The periwound skin appearance exhibited: Scarring. Wound #4 status is Open. Original cause of wound was Trauma. The date acquired was: 09/08/2021. The wound has been in treatment 27 weeks. The wound is located on the Right,Proximal,Lateral Lower Leg. The wound measures 0.1cm length x 0.1cm width x 0.1cm depth; 0.008cm^2 area and 0.001cm^3 volume. The wound is limited to skin breakdown. There is no tunneling or undermining noted. There is a medium amount of serosanguineous drainage noted. The wound margin is distinct with the outline attached to the wound base. There is no granulation within the wound bed. There is no necrotic tissue within the wound bed. The periwound skin appearance had no abnormalities noted for moisture. The periwound skin appearance had no abnormalities noted for color. The periwound skin appearance exhibited: Scarring. Periwound temperature was noted as No Abnormality. Wound #6 status is Open. Original cause of wound was Pressure Injury. The date acquired was: 09/17/2021. The wound has been in treatment 21 weeks. The wound is located on the Left Trochanter. The wound measures 0.1cm length x 0.1cm width x 0.1cm depth; 0.008cm^2 area and 0.001cm^3  volume. The wound is limited to skin breakdown. There is no tunneling or undermining noted. There is a medium amount of serosanguineous drainage noted. The wound margin is flat and intact. There is no granulation within the wound bed. There is no necrotic tissue within the wound bed. The periwound skin appearance had no abnormalities noted for moisture. The periwound skin appearance exhibited: Scarring. The periwound skin appearance did not exhibit: Callus, Crepitus, Excoriation, Induration, Rash, Atrophie Blanche, Cyanosis, Ecchymosis, Hemosiderin Staining, Mottled, Pallor, Rubor, Erythema. Periwound temperature was noted as No Abnormality. Assessment Active Problems ICD-10 Pressure ulcer of left buttock, stage 4 Type 2 diabetes mellitus without complications Pressure ulcer of left hip, stage 3 Pressure ulcer of other site, stage 3 [name], [name] ([id number]) [id number] Page 10 of 13 Procedures Wound #2 Pre-procedure diagnosis of Wound #2 is a Pressure Ulcer located on the Left Gluteus . There was a Selective/Open Wound Non-Viable Tissue Debridement with a total area of 10.4 sq cm performed by Fredirick Maudlin, MD. With the following instrument(s): Curette to remove Non-Viable tissue/material. Material removed includes [facility] after achieving pain control using Lidocaine 5% topical ointment. No specimens were taken. A time out was conducted at 15:55, prior to the start of the procedure. A Minimum amount of bleeding was controlled with Pressure. The procedure was tolerated well. Post Debridement Measurements: 4cm length x 2.6cm width x 1.3cm depth; 10.619cm^3 volume. Post debridement Stage noted as Category/Stage III. Character of Wound/Ulcer Post Debridement is improved. Post procedure Diagnosis Wound #2: Same as Pre-Procedure General Notes: scribed for Dr. Celine Ahr by Adline Peals, RN. Wound #4 Pre-procedure diagnosis of Wound #4 is a Pressure Ulcer located on  the Right,Proximal,Lateral Lower Leg . There was a Selective/Open Wound Non-Viable Tissue Debridement with a total area of 0.09 sq cm performed by Fredirick Maudlin, MD. With the following instrument(s): Curette to remove Non-Viable tissue/material. Material removed includes [facility] after achieving pain control using Lidocaine 5% topical ointment. No specimens were taken. A time out was conducted at 15:55, prior to the start of the procedure. A Minimum amount of bleeding  was controlled with Pressure. The procedure was tolerated well. Post Debridement Measurements: 0.1cm length x 0.1cm width x 0.1cm depth; 0.001cm^3 volume. Post debridement Stage noted as Category/Stage II. Character of Wound/Ulcer Post Debridement is improved. Post procedure Diagnosis Wound #4: Same as Pre-Procedure General Notes: scribed for Dr. Celine Ahr by Adline Peals, RN. Plan Follow-up Appointments: Return Appointment in: - 6 weeks Anesthetic: (In clinic) Topical Lidocaine 5% applied to wound bed - In clinic Bathing/ Shower/ Hygiene: May shower and wash wound with soap and water. - prior to dressing change Off-Loading: Low air-loss mattress (Group 2) Turn and reposition every 2 hours Additional Orders / Instructions: Follow Nutritious Diet - 100-120g of Protein Non Wound Condition: Protect area with: - Bilateral Lateral Knees , and Right Trochanter. Protect with Silicone Foam Borders or ABD pads Home Health: New wound care orders this week; continue Home Health for wound care. May utilize formulary equivalent dressing for wound treatment orders unless otherwise specified. - Silver Alginate to R leg ; Silver alginate to L trochanter Dakins to gluteus Other Home Health Orders/Instructions: - [facility] The following medication(s) was prescribed: lidocaine topical 5 % ointment ointment topical was prescribed at facility WOUND #2: - Gluteus Wound Laterality: Left Cleanser: Soap and Water Every Other Day/30 Days Discharge  Instructions: May shower and wash wound with dial antibacterial soap and water prior to dressing change. Cleanser: Wound Cleanser Every Other Day/30 Days Discharge Instructions: Cleanse the wound with wound cleanser prior to applying a clean dressing using gauze sponges, not tissue or cotton balls. Prim Dressing: Dakin's Solution 0.25%, 16 (oz) (Generic) Every Other Day/30 Days ary Discharge Instructions: Moisten gauze with Dakin's solution Secondary Dressing: Bordered Gauze, 4x4 in (Generic) Every Other Day/30 Days Discharge Instructions: Apply over primary dressing as directed. Secondary Dressing: Woven Gauze Sponge, Non-Sterile 4x4 in (Generic) Every Other Day/30 Days Discharge Instructions: Apply over primary dressing as directed. WOUND #4: - Lower Leg Wound Laterality: Right, Lateral, Proximal Cleanser: Soap and Water Every Other Day/30 Days Discharge Instructions: May shower and wash wound with dial antibacterial soap and water prior to dressing change. Cleanser: Wound Cleanser Every Other Day/30 Days Discharge Instructions: Cleanse the wound with wound cleanser prior to applying a clean dressing using gauze sponges, not tissue or cotton balls. Prim Dressing: KerraCel Ag Gelling Fiber Dressing, 2x2 in (silver alginate) (Generic) Every Other Day/30 Days ary Discharge Instructions: Apply silver alginate to wound bed as instructed Secondary Dressing: Bordered Gauze, 4x4 in (Generic) Every Other Day/30 Days Discharge Instructions: Apply over primary dressing as directed. WOUND #6: - Trochanter Wound Laterality: Left Cleanser: Soap and Water Every Other Day/30 Days Discharge Instructions: May shower and wash wound with dial antibacterial soap and water prior to dressing change. Cleanser: Wound Cleanser Every Other Day/30 Days Discharge Instructions: Cleanse the wound with wound cleanser prior to applying a clean dressing using gauze sponges, not tissue or cotton balls. Secondary Dressing:  Bordered Gauze, 4x4 in (Generic) Every Other Day/30 Days Discharge Instructions: Apply over primary dressing as directed. 03/21/2022: The sacral wound has contracted somewhat and has less undermining. There is a layer of slough on the wound surface. The left trochanter wound is down to just a tiny pinhole and the right fibular head has also contracted considerably. There is some slough and eschar on the surface. I used a curette to debride slough off of the sacral wound surface as well as slough and eschar off of the right fibular head wound. No debridement was ANNLEIGH, KNUEPPEL ([id number]) [id number] Page 11 of  13 necessary for the left trochanter wound. I think we can just put a foam border dressing on the left trochanter. Continue to pack the sacral wound with Dakin's moistened gauze. Continue silver alginate and foam border dressing to the right fibular head wound. Given the improvement and the obvious good care she is receiving, we can stretch our visits out to 6 weeks again. I will see her then. Electronic Signature(s) Signed: 03/21/2022 4:15:18 PM By: Fredirick Maudlin MD FACS Entered By: Fredirick Maudlin on 03/21/2022 16:15:18 -------------------------------------------------------------------------------- HxROS Details Patient Name: Date of Service: [name], [name] 03/21/2022 3:00 PM Medical Record Number: [id number] Patient Account Number: [id number] Date of Birth/Sex: Treating RN: March 31, 1936 (86 y.o. F) Primary Care Provider: [name] Other Clinician: Referring Provider: Treating Provider/Extender: Carmie End in Treatment: 39 Unable to Obtain Patient History due to Dementia Information Obtained From Patient Eyes Medical History: Negative for: Cataracts; Glaucoma; Optic Neuritis Ear/Nose/Mouth/Throat Medical History: Negative for: Chronic sinus problems/congestion; Middle ear problems Hematologic/Lymphatic Medical  History: Positive for: Anemia Negative for: Hemophilia; Human Immunodeficiency Virus; Lymphedema; Sickle Cell Disease Respiratory Medical History: Negative for: Aspiration; Asthma; Chronic Obstructive Pulmonary Disease (COPD); Pneumothorax; Sleep Apnea; Tuberculosis Past Medical History Notes: hx PE Cardiovascular Medical History: Positive for: Deep Vein Thrombosis; Hypertension Negative for: Angina; Arrhythmia; Congestive Heart Failure; Coronary Artery Disease; Hypotension; Myocardial Infarction; Peripheral Arterial Disease; Peripheral Venous Disease; Phlebitis; Vasculitis Past Medical History Notes: atrial septal aneurysm, hyperlipidemia Gastrointestinal Medical History: Negative for: Cirrhosis ; Colitis; Crohns; Hepatitis A; Hepatitis B; Hepatitis C Endocrine Medical History: Positive for: Type II Diabetes Negative for: Type I Diabetes Time with diabetes: 15 years Treated with: Oral agents Blood sugar tested every day: Yes Tested : 2 times per day [name], [name] ([id number]) [id number] Page 12 of 13 Genitourinary Medical History: Negative for: End Stage Renal Disease Past Medical History Notes: recent UTi Immunological Medical History: Negative for: Lupus Erythematosus; Raynauds; Scleroderma Integumentary (Skin) Medical History: Negative for: History of Burn Musculoskeletal Medical History: Negative for: Gout; Rheumatoid Arthritis; Osteoarthritis; Osteomyelitis Neurologic Medical History: Positive for: Dementia Negative for: Neuropathy; Quadriplegia; Paraplegia; Seizure Disorder Past Medical History Notes: CVA Oncologic Medical History: Negative for: Received Chemotherapy; Received Radiation Psychiatric Medical History: Negative for: Anorexia/bulimia; Confinement Anxiety Immunizations Pneumococcal Vaccine: Received Pneumococcal Vaccination: No Implantable Devices None Hospitalization / Surgery History Type of  Hospitalization/Surgery cholecystectomy vaginal hysterectomy vascular surgery Family and Social History Cancer: No; Diabetes: No; Heart Disease: No; Hereditary Spherocytosis: No; Hypertension: No; Kidney Disease: No; Lung Disease: No; Seizures: No; Stroke: Yes - Siblings; Thyroid Problems: No; Tuberculosis: No; Never smoker; Marital Status - Divorced; Alcohol Use: Never; Drug Use: No History; Caffeine Use: Never; Financial Concerns: No; Food, Clothing or Shelter Needs: No; Support System Lacking: No; Transportation Concerns: No Electronic Signature(s) Signed: 03/22/2022 7:39:34 AM By: Fredirick Maudlin MD FACS Entered By: Fredirick Maudlin on 03/21/2022 16:11:50 -------------------------------------------------------------------------------- SuperBill Details Patient Name: Date of Service: [name] 03/21/2022 Medical Record Number: [id number] Patient Account Number: [id number] Date of Birth/Sex: Treating RN: [birth year] (86 y.o. F) Primary Care Provider: [name] Other Clinician: Thomasenia Bottoms ([id number]) [id number] Page 13 of 13 Referring Provider: Treating Provider/Extender: Carmie End in Treatment: 76 Diagnosis Coding ICD-10 Codes Code Description [phone] Pressure ulcer of left buttock, stage 4 E11.9 Type 2 diabetes mellitus without complications S06.301 Pressure ulcer of left hip, stage 3 L89.893 Pressure ulcer of other site, stage 3 Facility Procedures : CPT4 Code: [id number] Description: 55732 - DEBRIDE WOUND 1ST 20 SQ CM OR < ICD-10 Diagnosis  Description L89.324 Pressure ulcer of left buttock, stage 4 L89.893 Pressure ulcer of other site, stage 3 Modifier: Quantity: 1 Physician Procedures : CPT4 Code Description Modifier [id number] - WC PHYS LEVEL 4 - EST PT 25 ICD-10 Diagnosis Description L89.324 Pressure ulcer of left buttock, stage 4 L89.223 Pressure ulcer of left hip, stage 3 L89.893 Pressure ulcer  of other site, stage 3 E11.9 Type  2 diabetes mellitus without complications Quantity: 1 : [id number] - WC PHYS DEBR WO ANESTH 20 SQ CM ICD-10 Diagnosis Description L89.324 Pressure ulcer of left buttock, stage 4 L89.893 Pressure ulcer of other site, stage 3 Quantity: 1 Electronic Signature(s) Signed: 03/21/2022 4:16:53 PM By: Fredirick Maudlin MD FACS Entered By: Fredirick Maudlin on 03/21/2022 16:16:53

## 2022-03-22 NOTE — Progress Notes (Signed)
[name] ([id number]) [id number] Page 1 of 9 Visit Report for 03/21/2022 Arrival Information Details Patient Name: Date of Service: [name], [name] 03/21/2022 3:00 PM Medical Record Number: [id number] Patient Account Number: [id number] Date of Birth/Sex: Treating RN: Aug 06, 1935 (86 y.o. [name] Primary Care Rhyse Skowron: [name] Other Clinician: Referring Caeson Filippi: Treating Chelesa Weingartner/Extender: Carmie End in Treatment: 76 Visit Information History Since Last Visit Added or deleted any medications: No Patient Arrived: Wheel Chair Any new allergies or adverse reactions: No Arrival Time: 15:30 Had a fall or experienced change in No Accompanied By: daughter activities of daily living that may affect Transfer Assistance: None risk of falls: Patient Identification Verified: Yes Signs or symptoms of abuse/neglect since last visito No Secondary Verification Process Completed: Yes Hospitalized since last visit: No Patient Requires Transmission-Based Precautions: No Implantable device outside of the clinic excluding No Patient Has Alerts: No cellular tissue based products placed in the center since last visit: Has Dressing in Place as Prescribed: Yes Pain Present Now: Unable to Respond Electronic Signature(s) Signed: 03/22/2022 7:40:26 AM By: Adline Peals Entered By: Adline Peals on 03/21/2022 15:30:54 -------------------------------------------------------------------------------- Encounter Discharge Information Details Patient Name: Date of Service: [name] 03/21/2022 3:00 PM Medical Record Number: [id number] Patient Account Number: [id number] Date of Birth/Sex: Treating RN: [birth year] (86 y.o. [name] Primary Care Collier Bohnet: [name] Other Clinician: Referring Analiyah Lechuga: Treating Anabia Weatherwax/Extender: Carmie End in Treatment: 33 Encounter  Discharge Information Items Post Procedure Vitals Discharge Condition: Stable Temperature (F): 97 Ambulatory Status: Wheelchair Pulse (bpm): 60 Discharge Destination: Home Respiratory Rate (breaths/min): 18 Transportation: Private Auto Blood Pressure (mmHg): 205/105 Accompanied By: daughter Schedule Follow-up Appointment: Yes Clinical Summary of Care: Patient Declined Electronic Signature(s) Signed: 03/22/2022 7:40:26 AM By: Adline Peals Entered By: Adline Peals on 03/21/2022 16:22:11 Thomasenia Bottoms ([id number]) [id number] Page 2 of 9 -------------------------------------------------------------------------------- Lower Extremity Assessment Details Patient Name: Date of Service: [name] 03/21/2022 3:00 PM Medical Record Number: [id number] Patient Account Number: [id number] Date of Birth/Sex: Treating RN: 12/25/1935 (86 y.o. [name] Primary Care Castle Lamons: [name] Other Clinician: Referring Lee-Ann Gal: Treating Sir Mallis/Extender: Carmie End in Treatment: 52 Electronic Signature(s) Signed: 03/22/2022 7:40:26 AM By: Sabas Sous By: Adline Peals on 03/21/2022 15:35:00 -------------------------------------------------------------------------------- Multi Wound Chart Details Patient Name: Date of Service: [name] 03/21/2022 3:00 PM Medical Record Number: [id number] Patient Account Number: [id number] Date of Birth/Sex: Treating RN: 03/25/1936 (86 y.o. F) Primary Care Tashena Ibach: [name] Other Clinician: Referring Donisha Hoch: Treating Melaine Mcphee/Extender: Carmie End in Treatment: 67 Vital Signs Height(in): 6 Pulse(bpm): 60 Weight(lbs): 110 Blood Pressure(mmHg): 205/105 Body Mass Index(BMI): 16.7 Temperature(F): Respiratory Rate(breaths/min): 18 [2:Photos: No Photos Left Gluteus Wound Location: Pressure Injury Wounding  Event: Pressure Ulcer Primary Etiology: Anemia, Deep Vein Thrombosis, Comorbid History: Hypertension, Type II Diabetes, Dementia 09/18/2020 Date Acquired: 62 Weeks of Treatment: Open Wound  Status: No Wound Recurrence: 4x2.6x1.3 Measurements L x W x D (cm) 8.168 A (cm) : rea 10.619 Volume (cm) : 80.40% % Reduction in A rea: -154.90% % Reduction in Volume: 12 Starting Position 1 (o'clock): 10 Ending Position 1 (o'clock): 2.3 Maximum  Distance 1 (cm): Yes Undermining: Category/Stage III Classification: Medium Exudate A mount: Serosanguineous Exudate Type: red, brown Exudate Color: Epibole Wound Margin: Medium (34-66%) Granulation A mount: Red, Pink Granulation Quality: Medium (34-66%)  Necrotic A mount: Fat Layer (Subcutaneous Tissue): Yes Fascia: No Exposed Structures: Fascia: No Tendon: No Muscle: No Joint: No] [4:No Photos Right, Proximal,  Lateral Lower Leg Trauma Pressure Ulcer Anemia, Deep Vein Thrombosis, Hypertension, Type II  Diabetes, Dementia 09/08/2021 27 Open No 0.1x0.1x0.1 0.008 0.001 93.20% 91.70% No Category/Stage II Medium Serosanguineous red, brown Distinct, outline attached None Present (0%) N/A None Present (0%) Fat Layer (Subcutaneous Tissue): No Tendon: No Muscle:  No Joint: No] [6:No Photos Left Trochanter Pressure Injury Pressure Ulcer Anemia, Deep Vein Thrombosis, Hypertension, Type II Diabetes, Dementia 09/17/2021 21 Open No 0.1x0.1x0.1 0.008 0.001 74.20% 66.70% No Unstageable/Unclassified Medium Serosanguineous  red, brown Flat and Intact None Present (0%) N/A None Present (0%) Fascia: No Fat Layer (Subcutaneous Tissue): No Tendon: No Muscle: No Joint: No] [name], [name] ([id number]) [2:Bone: No Small (1-33%) Epithelialization: Debridement - Selective/Open Wound Debridement - Selective/Open Wound Debridement: Pre-procedure Verification/Time Out 15:55 Taken: Lidocaine 5% topical ointment Pain Control: Slough Tissue Debrided:  Non-Viable Tissue Level: 10.4 Debridement A (sq cm): rea  Curette Instrument: Minimum Bleeding: Pressure Hemostasis A chieved: Procedure was tolerated well Debridement Treatment Response: 4x2.6x1.3 Post Debridement Measurements L x W x D (cm) 10.619 Post  Debridement Volume: (cm) Category/Stage III Post Debridement Stage: Scarring: Yes Periwound Skin Texture: No Abnormalities Noted Periwound Skin Moisture: No Abnormalities Noted Periwound Skin Color: N/A Temperature: Debridement Procedures Performed:]  [4:Bone: No Limited to Skin Breakdown Large (67-100%) 15:55 Lidocaine 5% topical ointment Slough Non-Viable Tissue 0.09 Curette Minimum Pressure Procedure was tolerated well 0.1x0.1x0.1 0.001 Category/Stage II Scarring: Yes No Abnormalities Noted No  Abnormalities Noted No Abnormality Debridement] [6:[id number] Page 3 of 9 Bone: No Limited to Skin Breakdown Large (67-100%) N/A N/A N/A N/A N/A N/A N/A N/A N/A N/A N/A N/A N/A Scarring: Yes Excoriation: No Induration: No  Callus: No Crepitus: No Rash: No Maceration: No Dry/Scaly: No Atrophie Blanche: No Cyanosis: No Ecchymosis: No Erythema: No Hemosiderin Staining: No Mottled: No Pallor: No Rubor: No No Abnormality N/A] Treatment Notes Electronic Signature(s) Signed: 03/21/2022 4:09:46 PM By: Fredirick Maudlin MD FACS Entered By: Fredirick Maudlin on 03/21/2022 16:09:45 -------------------------------------------------------------------------------- Multi-Disciplinary Care Plan Details Patient Name: Date of Service: [name] 03/21/2022 3:00 PM Medical Record Number: [id number] Patient Account Number: [id number] Date of Birth/Sex: Treating RN: [birth year] (86 y.o. [name] Primary Care Corben Auzenne: [name] Other Clinician: Referring Latrease Kunde: Treating Flay Ghosh/Extender: Carmie End in Treatment: 70 Active Inactive Wound/Skin Impairment Nursing Diagnoses: Impaired tissue integrity Goals: Patient/caregiver will verbalize  understanding of skin care regimen Date Initiated: 10/02/2020 Target Resolution Date: 05/06/2022 Goal Status: Active Ulcer/skin breakdown will have a volume reduction of 30% by week 4 Date Initiated: 10/02/2020 Date Inactivated: 01/06/2021 Target Resolution Date: 01/09/2021 Goal Status: Met Interventions: Assess patient/caregiver ability to obtain necessary supplies CARSYNN, BETHUNE ([id number]) [id number] Page 4 of 9 Assess patient/caregiver ability to perform ulcer/skin care regimen upon admission and as needed Assess ulceration(s) every visit Provide education on ulcer and skin care Treatment Activities: Skin care regimen initiated : 10/02/2020 Topical wound management initiated : 10/02/2020 Notes: 11/10/20 : Goal not yet met, eschar debrided off increasing wound size. Electronic Signature(s) Signed: 03/22/2022 7:40:26 AM By: Sabas Sous By: Adline Peals on 03/21/2022 15:46:56 -------------------------------------------------------------------------------- Pain Assessment Details Patient Name: Date of Service: [name], [name] 03/21/2022 3:00 PM Medical Record Number: [id number] Patient Account Number: [id number] Date of Birth/Sex: Treating RN: [birth year] (86 y.o. [name] Primary Care [place] Nie: [name] Other Clinician: Referring Cainan Trull: Treating Amori Colomb/Extender: Carmie End in Treatment: 23 Active Problems Location of Pain Severity and Description of Pain Patient Has Paino Patient Unable to Respond Site Locations Pain Management  and Medication Current Pain Management: Electronic Signature(s) Signed: 03/22/2022 7:40:26 AM By: Adline Peals Entered By: Adline Peals on 03/21/2022 15:31:45 -------------------------------------------------------------------------------- Patient/Caregiver Education Details Patient Name: Date of Service: [name] 11/13/2023andnbsp3:00  PM Thomasenia Bottoms ([id number]) [id number] Page 5 of 9 Medical Record Number: [id number] Patient Account Number: [id number] Date of Birth/Gender: Treating RN: 1936-01-17 (86 y.o. [name] Primary Care Physician: [name] Other Clinician: Referring Physician: Treating Physician/Extender: Carmie End in Treatment: 22 Education Assessment Education Provided To: Caregiver Education Topics Provided Wound/Skin Impairment: Methods: Explain/Verbal Responses: Reinforcements needed, State content correctly Electronic Signature(s) Signed: 03/22/2022 7:40:26 AM By: Adline Peals Entered By: Adline Peals on 03/21/2022 15:47:42 -------------------------------------------------------------------------------- Wound Assessment Details Patient Name: Date of Service: [name], [name] 03/21/2022 3:00 PM Medical Record Number: [id number] Patient Account Number: [id number] Date of Birth/Sex: Treating RN: [birth year] (86 y.o. [name] Primary Care Sephiroth Mcluckie: [name] Other Clinician: Referring Dywane Peruski: Treating Manar Smalling/Extender: Carmie End in Treatment: 76 Wound Status Wound Number: 2 Primary Pressure Ulcer Etiology: Wound Location: Left Gluteus Wound Status: Open Wounding Event: Pressure Injury Comorbid Anemia, Deep Vein Thrombosis, Hypertension, Type II Date Acquired: 09/18/2020 History: Diabetes, Dementia Weeks Of Treatment: 76 Clustered Wound: No Wound Measurements Length: (cm) 4 Width: (cm) 2.6 Depth: (cm) 1.3 Area: (cm) 8.168 Volume: (cm) 10.619 % Reduction in Area: 80.4% % Reduction in Volume: -154.9% Epithelialization: Small (1-33%) Undermining: Yes Starting Position (o'clock): 12 Ending Position (o'clock): 10 Maximum Distance: (cm) 2.3 Wound Description Classification: Category/Stage III Wound Margin: Epibole Exudate Amount: Medium Exudate  Type: Serosanguineous Exudate Color: red, brown Foul Odor After Cleansing: No Slough/Fibrino Yes Wound Bed Granulation Amount: Medium (34-66%) Exposed Structure Granulation Quality: Red, Pink Fascia Exposed: No Necrotic Amount: Medium (34-66%) Fat Layer (Subcutaneous Tissue) Exposed: Yes Necrotic Quality: Adherent Slough Tendon Exposed: No Muscle Exposed: No Joint Exposed: No Bone Exposed: No CHRISETTE, MAN ([id number]) [id number] Page 6 of 9 Periwound Skin Texture Texture Color No Abnormalities Noted: No No Abnormalities Noted: Yes Scarring: Yes Moisture No Abnormalities Noted: Yes Treatment Notes Wound #2 (Gluteus) Wound Laterality: Left Cleanser Soap and Water Discharge Instruction: May shower and wash wound with dial antibacterial soap and water prior to dressing change. Wound Cleanser Discharge Instruction: Cleanse the wound with wound cleanser prior to applying a clean dressing using gauze sponges, not tissue or cotton balls. Peri-Wound Care Topical Primary Dressing Dakin's Solution 0.25%, 16 (oz) Discharge Instruction: Moisten gauze with Dakin's solution Secondary Dressing Bordered Gauze, 4x4 in Discharge Instruction: Apply over primary dressing as directed. Woven Gauze Sponge, Non-Sterile 4x4 in Discharge Instruction: Apply over primary dressing as directed. Secured With Compression Wrap Compression Stockings Environmental education officer) Signed: 03/22/2022 7:40:26 AM By: Adline Peals Entered By: Adline Peals on 03/21/2022 15:42:08 -------------------------------------------------------------------------------- Wound Assessment Details Patient Name: Date of Service: MERON, BOCCHINO 03/21/2022 3:00 PM Medical Record Number: [id number] Patient Account Number: [id number] Date of Birth/Sex: Treating RN: 03/23/1936 (86 y.o. [name] Primary Care Bunny Kleist: [name] Other Clinician: Referring  Ayde Record: Treating Jaciel Diem/Extender: Carmie End in Treatment: 76 Wound Status Wound Number: 4 Primary Pressure Ulcer Etiology: Wound Location: Right, Proximal, Lateral Lower Leg Wound Status: Open Wounding Event: Trauma Comorbid Anemia, Deep Vein Thrombosis, Hypertension, Type II Date Acquired: 09/08/2021 History: Diabetes, Dementia Weeks Of Treatment: 27 Clustered Wound: No Wound Measurements Length: (cm) 0.1 Width: (cm) 0.1 Depth: (cm) 0.1 Area: (cm) 0.008 Volume: (cm) 0.001 Leer, Rudene Christians ([id number]) Wound Description Classification: Category/Stage II Wound Margin: Distinct, outline attached Exudate Amount:  Medium Exudate Type: Serosanguineous Exudate Color: red, brown Foul Odor After Cleansing: No Slough/Fibrino No % Reduction in Area: 93.2% % Reduction in Volume: 91.7% Epithelialization: Large (67-100%) Tunneling: No Undermining: No [id number] Page 7 of 9 Wound Bed Granulation Amount: None Present (0%) Exposed Structure Necrotic Amount: None Present (0%) Fascia Exposed: No Fat Layer (Subcutaneous Tissue) Exposed: No Tendon Exposed: No Muscle Exposed: No Joint Exposed: No Bone Exposed: No Limited to Skin Breakdown Periwound Skin Texture Texture Color No Abnormalities Noted: No No Abnormalities Noted: Yes Scarring: Yes Temperature / Pain Temperature: No Abnormality Moisture No Abnormalities Noted: Yes Treatment Notes Wound #4 (Lower Leg) Wound Laterality: Right, Lateral, Proximal Cleanser Soap and Water Discharge Instruction: May shower and wash wound with dial antibacterial soap and water prior to dressing change. Wound Cleanser Discharge Instruction: Cleanse the wound with wound cleanser prior to applying a clean dressing using gauze sponges, not tissue or cotton balls. Peri-Wound Care Topical Primary Dressing KerraCel Ag Gelling Fiber Dressing, 2x2 in (silver alginate) Discharge  Instruction: Apply silver alginate to wound bed as instructed Secondary Dressing Bordered Gauze, 4x4 in Discharge Instruction: Apply over primary dressing as directed. Secured With Compression Wrap Compression Stockings Environmental education officer) Signed: 03/22/2022 7:40:26 AM By: Adline Peals Entered By: Adline Peals on 03/21/2022 15:36:25 -------------------------------------------------------------------------------- Wound Assessment Details Patient Name: Date of Service: [name], [name] 03/21/2022 3:00 PM Medical Record Number: [id number] Patient Account Number: [id number] Date of Birth/Sex: Treating RN: 1935-08-20 (86 y.o. [name] Primary Care Taydon Nasworthy: [name] Other Clinician: Referring Hardeep Reetz: Treating Keyshla Tunison/Extender: Carmie End in Treatment: [address], Rudene Christians ([id number]) [id number] Page 8 of 9 Wound Status Wound Number: 6 Primary Pressure Ulcer Etiology: Wound Location: Left Trochanter Wound Status: Open Wounding Event: Pressure Injury Comorbid Anemia, Deep Vein Thrombosis, Hypertension, Type II Date Acquired: 09/17/2021 History: Diabetes, Dementia Weeks Of Treatment: 21 Clustered Wound: No Wound Measurements Length: (cm) 0.1 Width: (cm) 0.1 Depth: (cm) 0.1 Area: (cm) 0.008 Volume: (cm) 0.001 % Reduction in Area: 74.2% % Reduction in Volume: 66.7% Epithelialization: Large (67-100%) Tunneling: No Undermining: No Wound Description Classification: Unstageable/Unclassified Wound Margin: Flat and Intact Exudate Amount: Medium Exudate Type: Serosanguineous Exudate Color: red, brown Foul Odor After Cleansing: No Slough/Fibrino No Wound Bed Granulation Amount: None Present (0%) Exposed Structure Necrotic Amount: None Present (0%) Fascia Exposed: No Fat Layer (Subcutaneous Tissue) Exposed: No Tendon Exposed: No Muscle Exposed: No Joint Exposed: No Bone  Exposed: No Limited to Skin Breakdown Periwound Skin Texture Texture Color No Abnormalities Noted: No No Abnormalities Noted: No Callus: No Atrophie Blanche: No Crepitus: No Cyanosis: No Excoriation: No Ecchymosis: No Induration: No Erythema: No Rash: No Hemosiderin Staining: No Scarring: Yes Mottled: No Pallor: No Moisture Rubor: No No Abnormalities Noted: Yes Temperature / Pain Temperature: No Abnormality Treatment Notes Wound #6 (Trochanter) Wound Laterality: Left Cleanser Soap and Water Discharge Instruction: May shower and wash wound with dial antibacterial soap and water prior to dressing change. Wound Cleanser Discharge Instruction: Cleanse the wound with wound cleanser prior to applying a clean dressing using gauze sponges, not tissue or cotton balls. Peri-Wound Care Topical Primary Dressing Secondary Dressing Bordered Gauze, 4x4 in Discharge Instruction: Apply over primary dressing as directed. Secured With Compression Wrap Compression Stockings Add-Ons Electronic Signature(s) [name], [name] ([id number]) [id number] Page 9 of 9 Signed: 03/22/2022 7:40:26 AM By: Sabas Sous By: Adline Peals on 03/21/2022 15:41:23 -------------------------------------------------------------------------------- Vitals Details Patient Name: Date of Service: [name] 03/21/2022 3:00 PM Medical Record Number: [id number] Patient Account Number:  [id number] Date of Birth/Sex: Treating RN: 12-06-1935 (86 y.o. [name] Primary Care Shariyah Eland: [name] Other Clinician: Referring Erricka Falkner: Treating Asharia Lotter/Extender: Carmie End in Treatment: 83 Vital Signs Time Taken: 15:30 Pulse (bpm): 60 Height (in): 68 Respiratory Rate (breaths/min): 18 Weight (lbs): 110 Blood Pressure (mmHg): 205/105 Body Mass Index (BMI): 16.7 Reference Range: 80 - 120 mg / dl Electronic  Signature(s) Signed: 03/22/2022 7:40:26 AM By: Adline Peals Entered By: Adline Peals on 03/21/2022 15:31:35

## 2022-03-23 DIAGNOSIS — Z7901 Long term (current) use of anticoagulants: Secondary | ICD-10-CM | POA: Diagnosis not present

## 2022-03-23 DIAGNOSIS — Z86711 Personal history of pulmonary embolism: Secondary | ICD-10-CM | POA: Diagnosis not present

## 2022-03-23 DIAGNOSIS — I890 Lymphedema, not elsewhere classified: Secondary | ICD-10-CM | POA: Diagnosis not present

## 2022-03-23 DIAGNOSIS — Z741 Need for assistance with personal care: Secondary | ICD-10-CM | POA: Diagnosis not present

## 2022-03-23 DIAGNOSIS — E1140 Type 2 diabetes mellitus with diabetic neuropathy, unspecified: Secondary | ICD-10-CM | POA: Diagnosis not present

## 2022-03-23 DIAGNOSIS — K5900 Constipation, unspecified: Secondary | ICD-10-CM | POA: Diagnosis not present

## 2022-03-23 DIAGNOSIS — L89324 Pressure ulcer of left buttock, stage 4: Secondary | ICD-10-CM | POA: Diagnosis not present

## 2022-03-23 DIAGNOSIS — L89893 Pressure ulcer of other site, stage 3: Secondary | ICD-10-CM | POA: Diagnosis not present

## 2022-03-23 DIAGNOSIS — Z8673 Personal history of transient ischemic attack (TIA), and cerebral infarction without residual deficits: Secondary | ICD-10-CM | POA: Diagnosis not present

## 2022-03-23 DIAGNOSIS — F0390 Unspecified dementia without behavioral disturbance: Secondary | ICD-10-CM | POA: Diagnosis not present

## 2022-03-23 DIAGNOSIS — I10 Essential (primary) hypertension: Secondary | ICD-10-CM | POA: Diagnosis not present

## 2022-03-23 DIAGNOSIS — Z7984 Long term (current) use of oral hypoglycemic drugs: Secondary | ICD-10-CM | POA: Diagnosis not present

## 2022-03-23 DIAGNOSIS — E785 Hyperlipidemia, unspecified: Secondary | ICD-10-CM | POA: Diagnosis not present

## 2022-03-23 DIAGNOSIS — Z86718 Personal history of other venous thrombosis and embolism: Secondary | ICD-10-CM | POA: Diagnosis not present

## 2022-03-23 DIAGNOSIS — M6281 Muscle weakness (generalized): Secondary | ICD-10-CM | POA: Diagnosis not present

## 2022-03-29 DIAGNOSIS — I890 Lymphedema, not elsewhere classified: Secondary | ICD-10-CM | POA: Diagnosis not present

## 2022-03-29 DIAGNOSIS — E1140 Type 2 diabetes mellitus with diabetic neuropathy, unspecified: Secondary | ICD-10-CM | POA: Diagnosis not present

## 2022-03-29 DIAGNOSIS — Z7984 Long term (current) use of oral hypoglycemic drugs: Secondary | ICD-10-CM | POA: Diagnosis not present

## 2022-03-29 DIAGNOSIS — F0390 Unspecified dementia without behavioral disturbance: Secondary | ICD-10-CM | POA: Diagnosis not present

## 2022-03-29 DIAGNOSIS — L89324 Pressure ulcer of left buttock, stage 4: Secondary | ICD-10-CM | POA: Diagnosis not present

## 2022-03-29 DIAGNOSIS — L89893 Pressure ulcer of other site, stage 3: Secondary | ICD-10-CM | POA: Diagnosis not present

## 2022-03-30 ENCOUNTER — Inpatient Hospital Stay: Payer: Medicare Other | Attending: Hematology & Oncology

## 2022-03-30 ENCOUNTER — Encounter: Payer: Self-pay | Admitting: Family

## 2022-03-30 ENCOUNTER — Inpatient Hospital Stay (HOSPITAL_BASED_OUTPATIENT_CLINIC_OR_DEPARTMENT_OTHER): Payer: Medicare Other | Admitting: Family

## 2022-03-30 VITALS — BP 199/87 | HR 73 | Temp 97.8°F | Resp 20

## 2022-03-30 DIAGNOSIS — D500 Iron deficiency anemia secondary to blood loss (chronic): Secondary | ICD-10-CM

## 2022-03-30 DIAGNOSIS — D509 Iron deficiency anemia, unspecified: Secondary | ICD-10-CM | POA: Diagnosis not present

## 2022-03-30 DIAGNOSIS — Z86711 Personal history of pulmonary embolism: Secondary | ICD-10-CM | POA: Insufficient documentation

## 2022-03-30 DIAGNOSIS — Z7901 Long term (current) use of anticoagulants: Secondary | ICD-10-CM | POA: Diagnosis not present

## 2022-03-30 DIAGNOSIS — Z8673 Personal history of transient ischemic attack (TIA), and cerebral infarction without residual deficits: Secondary | ICD-10-CM | POA: Insufficient documentation

## 2022-03-30 DIAGNOSIS — D649 Anemia, unspecified: Secondary | ICD-10-CM

## 2022-03-30 LAB — CBC WITH DIFFERENTIAL (CANCER CENTER ONLY)
Abs Immature Granulocytes: 0.01 10*3/uL (ref 0.00–0.07)
Basophils Absolute: 0 10*3/uL (ref 0.0–0.1)
Basophils Relative: 1 %
Eosinophils Absolute: 0 10*3/uL (ref 0.0–0.5)
Eosinophils Relative: 1 %
HCT: 34.1 % — ABNORMAL LOW (ref 36.0–46.0)
Hemoglobin: 10.3 g/dL — ABNORMAL LOW (ref 12.0–15.0)
Immature Granulocytes: 0 %
Lymphocytes Relative: 41 %
Lymphs Abs: 1.4 10*3/uL (ref 0.7–4.0)
MCH: 26.8 pg (ref 26.0–34.0)
MCHC: 30.2 g/dL (ref 30.0–36.0)
MCV: 88.6 fL (ref 80.0–100.0)
Monocytes Absolute: 0.2 10*3/uL (ref 0.1–1.0)
Monocytes Relative: 6 %
Neutro Abs: 1.8 10*3/uL (ref 1.7–7.7)
Neutrophils Relative %: 51 %
Platelet Count: 227 10*3/uL (ref 150–400)
RBC: 3.85 MIL/uL — ABNORMAL LOW (ref 3.87–5.11)
RDW: 18 % — ABNORMAL HIGH (ref 11.5–15.5)
WBC Count: 3.5 10*3/uL — ABNORMAL LOW (ref 4.0–10.5)
nRBC: 0 % (ref 0.0–0.2)

## 2022-03-30 LAB — SAMPLE TO BLOOD BANK

## 2022-03-30 LAB — RETICULOCYTES
Immature Retic Fract: 14.6 % (ref 2.3–15.9)
RBC.: 3.74 MIL/uL — ABNORMAL LOW (ref 3.87–5.11)
Retic Count, Absolute: 53.1 10*3/uL (ref 19.0–186.0)
Retic Ct Pct: 1.4 % (ref 0.4–3.1)

## 2022-03-30 NOTE — Progress Notes (Signed)
Hematology and Oncology Follow Up Visit  [name] [id number] [birth year] 86 y.o. 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 She is non verbal at this time and all information was obtained from her daughter.  She had experienced nausea and loss of appetite while taking folic acid. After stopping this her nausea resolved and appetite picked back up.  No bleeding, bruising or petechiae.  No fever, chills, n/v, cough, rash, dizziness, SOB, chest pain, palpitations, abdominal pain or changes in bowel or bladder habits noted.  Minimal swelling in ankles comes and goes.  No falls or syncope reported.  Appetite better and hydration good. Unable to stand for weight.   ECOG Performance Status: 1 - Symptomatic but completely ambulatory  Medications:  Allergies as of 03/30/2022   No Known Allergies      Medication List        Accurate as of March 30, 2022  2:23 PM. If you have any questions, ask your nurse or doctor.          A&D Oint Apply 1 application topically as needed.   Accu-Chek Guide Control Liqd Use for blood sugar machine testing. Dx: E11.42   Accu-Chek Guide test strip Generic drug: glucose blood Use to test blood sugar twice daily. Dx: E11.42   Accu-Chek Guide w/Device Kit Use to test blood sugar twice daily. Dx: E11.9   Accu-Chek Softclix Lancets lancets 1 each by Other route 2 (two) times daily. Use as instructed Dx: E11.9   acetaminophen 500 MG tablet Commonly known as: TYLENOL Take 500 mg by mouth every 8 (eight) hours as needed (for general pain).   atorvastatin 40 MG tablet Commonly known as:  LIPITOR TAKE ONE TABLET BY MOUTH EVERY DAY AT 6:00PM - NEED TO KEEP APPOINTMENT BEFORE ANYMORE REFILLS   cloNIDine 0.1 MG tablet Commonly known as: CATAPRES TAKE ONE TABLET BY MOUTH TWICE A DAY MAY TAKE AN ADDITIONAL TABLET IF NEEDED SYSTOLIC BLOOD PRESSURE GREATER THAN 629   folic acid 1 MG tablet Commonly known as: FOLVITE Take 1 tablet (1 mg total) by mouth daily.   gabapentin 300 MG capsule Commonly known as: NEURONTIN TAKE ONE CAPSULE BY MOUTH TWICE A DAY   losartan 100 MG tablet Commonly known as: Cozaar Take 1 tablet (100 mg total) by mouth daily.   metFORMIN 500 MG tablet Commonly known as: GLUCOPHAGE Take 2 tablets (1,000 mg total) by mouth in the morning and at bedtime.   NON FORMULARY Take 1 capsule by mouth See admin instructions. Metagenics - UltraFlora Balance Daily Probiotic Immune Support* capsules- Take 1 capsule by mouth once a day   polyethylene glycol 17 g packet Commonly known as: MIRALAX / GLYCOLAX Take 17 g by mouth daily as needed for mild constipation (MIX AND DRINK).   Rivaroxaban 15 MG Tabs tablet Commonly known as: Xarelto TAKE ONE TABLET BY MOUTH EVERY DAY WITH SUPPER   SANTYL EX Apply 1 Application topically as needed.   Vitamin D-3 125 MCG (5000 UT) Tabs Take 5,000 Units by mouth daily.   zinc gluconate 50 MG tablet Take 50 mg by mouth daily.  Allergies: No Known Allergies  Past Medical History, Surgical history, Social history, and Family History were reviewed and updated.  Review of Systems: All other 10 point review of systems is negative.   Physical Exam:  oral temperature is 97.8 F (36.6 C). Her blood pressure is 199/87 (abnormal) and her pulse is 73. Her respiration is 20 and oxygen saturation is 100%.   Wt Readings from Last 3 Encounters:  05/04/19 130 lb 1.1 oz (59 kg)  11/02/18 130 lb (59 kg)  01/30/18 130 lb (59 kg)    Ocular: Sclerae unicteric, pupils equal, round and reactive to light Ear-nose-throat:  Oropharynx clear, dentition fair Lymphatic: No cervical or supraclavicular adenopathy Lungs no rales or rhonchi, good excursion bilaterally Heart regular rate and rhythm, no murmur appreciated Abd soft, nontender, positive bowel sounds MSK no focal spinal tenderness, no joint edema Neuro: non-focal, well-oriented, appropriate affect Breasts: Deferred   Lab Results  Component Value Date   WBC 3.5 (L) 03/30/2022   HGB 10.3 (L) 03/30/2022   HCT 34.1 (L) 03/30/2022   MCV 88.6 03/30/2022   PLT 227 03/30/2022   Lab Results  Component Value Date   FERRITIN 9 (L) 02/01/2022   IRON 22 (L) 02/01/2022   TIBC 309 02/01/2022   UIBC 287 02/01/2022   IRONPCTSAT 7 (L) 02/01/2022   Lab Results  Component Value Date   RETICCTPCT 1.4 03/30/2022   RBC 3.74 (L) 03/30/2022   RBC 3.85 (L) 03/30/2022   No results found for: "KPAFRELGTCHN", "LAMBDASER", "KAPLAMBRATIO" No results found for: "IGGSERUM", "IGA", "IGMSERUM" No results found for: "TOTALPROTELP", "ALBUMINELP", "A1GS", "A2GS", "BETS", "BETA2SER", "GAMS", "MSPIKE", "SPEI"   Chemistry      Component Value Date/Time   NA 139 02/01/2022 1448   NA 142 02/28/2017 1059   K 4.0 02/01/2022 1448   K 3.8 02/28/2017 1059   CL 101 02/01/2022 1448   CL 102 02/28/2017 1059   CO2 28 02/01/2022 1448   CO2 30 02/28/2017 1059   BUN 28 (H) 02/01/2022 1448   BUN 16 02/28/2017 1059   CREATININE 0.54 02/01/2022 1448   CREATININE 0.66 01/14/2022 1106   GLU 140 03/30/2016 0000      Component Value Date/Time   CALCIUM 9.3 02/01/2022 1448   CALCIUM 9.0 02/28/2017 1059   ALKPHOS 62 02/01/2022 1448   ALKPHOS 68 02/28/2017 1059   AST 12 (L) 02/01/2022 1448   ALT 11 02/01/2022 1448   ALT 14 02/28/2017 1059   BILITOT 0.3 02/01/2022 1448       Impression and Plan: [name] is a very pleasant 86 yo African American female with history of PE and lower extremity DVT with iron deficiency anemia secondary to long term anticoagulation use. Anemia is  improved.  Follow-up in 3 months.   Lottie Dawson, NP 11/22/20232:23 PM

## 2022-04-05 ENCOUNTER — Other Ambulatory Visit: Payer: Medicare Other | Admitting: *Deleted

## 2022-04-05 ENCOUNTER — Telehealth: Payer: Self-pay | Admitting: Family Medicine

## 2022-04-05 DIAGNOSIS — F0390 Unspecified dementia without behavioral disturbance: Secondary | ICD-10-CM | POA: Diagnosis not present

## 2022-04-05 DIAGNOSIS — E1140 Type 2 diabetes mellitus with diabetic neuropathy, unspecified: Secondary | ICD-10-CM | POA: Diagnosis not present

## 2022-04-05 DIAGNOSIS — L89324 Pressure ulcer of left buttock, stage 4: Secondary | ICD-10-CM | POA: Diagnosis not present

## 2022-04-05 DIAGNOSIS — Z7984 Long term (current) use of oral hypoglycemic drugs: Secondary | ICD-10-CM | POA: Diagnosis not present

## 2022-04-05 DIAGNOSIS — I890 Lymphedema, not elsewhere classified: Secondary | ICD-10-CM | POA: Diagnosis not present

## 2022-04-05 DIAGNOSIS — L89893 Pressure ulcer of other site, stage 3: Secondary | ICD-10-CM | POA: Diagnosis not present

## 2022-04-05 DIAGNOSIS — Z515 Encounter for palliative care: Secondary | ICD-10-CM

## 2022-04-05 NOTE — Telephone Encounter (Signed)
TCF Sofie Rower, pt's daughter-in-law and primary caregiver stating recent acute mental status change with request for visit. She states pt was "her normal self" on Saturday but then she has become increasingly lethargic, intermittently difficult to arouse and does not always open her eyes and and follow people with her vision around the room like usual.  Pt has a stage 3-4 on her buttock and is receiving intermittent wound visits with [name] doing wound care in between.  No evidence of infection.  She has been giving pt pureed foods and has had no difficulty coughing/choking after eating, will sometimes cough with liquids.  She states pt had low BP for her with a 160/90 on Sunday and usually her first BP is higher.  She wasn't sure if the patient may have had a stroke or bleed and before taking her to the doctor or hospital wanted to see if someone could come out to evaluate her.  Asked about goals of care and she states that they do not want aggressive intervention like ventilators or feeding tubes and pt has living will but if there was something that could be done to improve her health and wellbeing that was not overly invasive they might consider taking her in but long term goal is to make her comfortable and keep her home.  Advised that if not within a 4 hour timeframe if pt had a stroke then there was no intervention but maximizing things like a statin, ASA and controlling BP/blood sugar.  She mentioned if there might be a bleed and was educated that if bleeding there was not a lot that could be done but monitor her symptoms and she might have pupillary inequality, become more lethargic/difficult to arouse, have nausea and vomiting or could potentially seize or posture depending on the area of the brain affected.  She states that they would be open to hospice if needed but with pt staying home.  Reinforced supporting her in that effort.  She states pt does not appear uncomfortable.  Advised that clinician  team including Katheren Puller and Daryl Eastern would be the one to follow up and she requests that they leave a message regarding potential for visit. Notified Daryl Eastern, RN of [name]'s concern and request for a visit and willingness to consider potential Hospice services if indicated.  Asked that she leave a message for [name] to let her know when she might be able to see her.  Damaris Hippo FNP-C

## 2022-04-06 ENCOUNTER — Telehealth: Payer: Self-pay

## 2022-04-06 NOTE — Telephone Encounter (Signed)
Patient' daughter-in-law [name]) called and to report change in status for patient. She reports that patient became lethargic and not eating much on Sun/day,04/03/22. [name] scheduled appointment with Dani Gobble on tomorrow,04/05/22.

## 2022-04-07 ENCOUNTER — Encounter: Payer: Self-pay | Admitting: Nurse Practitioner

## 2022-04-07 ENCOUNTER — Telehealth (INDEPENDENT_AMBULATORY_CARE_PROVIDER_SITE_OTHER): Payer: Medicare Other | Admitting: Nurse Practitioner

## 2022-04-07 DIAGNOSIS — K5904 Chronic idiopathic constipation: Secondary | ICD-10-CM

## 2022-04-07 DIAGNOSIS — F0390 Unspecified dementia without behavioral disturbance: Secondary | ICD-10-CM | POA: Diagnosis not present

## 2022-04-07 DIAGNOSIS — I10 Essential (primary) hypertension: Secondary | ICD-10-CM | POA: Diagnosis not present

## 2022-04-07 DIAGNOSIS — R5383 Other fatigue: Secondary | ICD-10-CM

## 2022-04-07 NOTE — Patient Instructions (Signed)
Please contact your local pharmacy, previous provider, or insurance carrier for vaccine/immunization records. Ensure that any procedures done outside of [facility] are faxed to us [phone] or you can sign release of records form at the front desk to keep your medical record updated.   ?

## 2022-04-07 NOTE — Progress Notes (Signed)
   This service is provided via telemedicine  No vital signs collected/recorded due to the encounter was a telemedicine visit.   Location of patient (ex: home, work):  Home  Patient consents to a telephone visit:  Yes  Location of the provider (ex: office, home):  [facility]  Name of any referring provider:  [name], NP   Names of all persons participating in the telemedicine service and their role in the encounter:  Patient, [name] Daughter in [place], [place], [place], [name], NP.    Time spent on call:  8 minutes spent on the phone with Medical Assistant.

## 2022-04-07 NOTE — Progress Notes (Signed)
Careteam: Patient Care Team: [name], NP as PCP - General (Geriatric Medicine) [name], MD as Consulting Physician (Oncology) [name], [name], LCSW as Social Worker (Licensed Clinical Social Worker) [name], RN as Registered Nurse [facility])  Advanced Directive information Does Patient Have a Medical Advance Directive?: Yes, Type of Advance Directive: Healthcare Power of [place];Living will;Out of facility DNR (pink MOST or yellow form), Does patient want to make changes to medical advance directive?: No - Patient declined  No Known Allergies  Chief Complaint  Patient presents with   Acute Visit    Patient daughter in law [name] states that patient has been very lethargic but improving.      HPI: Patient is a 86 y.o. female due to pt being more lethagic.  [name] providing details for visit Reports 5 days ago she was very out of it.  Increase in alertness over the last few days and back to baseline yesterday. 5 days ago she could not open her eyes at the time.  Daughter increased fluid and pureed foods to push intake.  Her blood pressure as 160s/80s which is low for her.  [name] questioned if she possibly had a stroke. She was having a hard time swallowing but now it better- daughter in law felt like she was trying to drink too fast.  Appetite is back on track.  Blood pressure this morning was well controlled.   Had labs on 03/30/22 per hematology due to chronic anemia.   Since she has had wounds she is staying in bed most of the time. Continues to follow up with wound care center. Nursing still follows  Review of Systems:  Review of Systems  Unable to perform ROS: Dementia    Past Medical History:  Diagnosis Date   Dementia ([place])    Diabetes mellitus type II 03/21/2011   DVT (deep venous thrombosis) ([place]) 03/21/2011   Hyperlipidemia 03/21/2011   Hypertension 03/21/2011   Lymphedema of leg  03/21/2011   Pulmonary embolism ([place]) 03/21/2011   Stroke [facility])    Past Surgical History:  Procedure Laterality Date   CHOLECYSTECTOMY  2015   SKIN TAG REMOVAL  07/25/2016   VAGINAL HYSTERECTOMY     unknown of date   VASCULAR SURGERY     Social History:   reports that she has never smoked. She has never used smokeless tobacco. She reports that she does not drink alcohol and does not use drugs.  Family History  Problem Relation Age of Onset   Heart attack Mother    COPD Sister    Stroke Sister    Bipolar disorder Daughter     Medications: Patient's Medications  New Prescriptions   No medications on file  Previous Medications   A&D OINT    Apply 1 application topically as needed.    ACCU-CHEK SOFTCLIX LANCETS LANCETS    1 each by Other route 2 (two) times daily. Use as instructed Dx: E11.9   ACETAMINOPHEN (TYLENOL) 500 MG TABLET    Take 500 mg by mouth every 8 (eight) hours as needed (for general pain).    ATORVASTATIN (LIPITOR) 40 MG TABLET    TAKE ONE TABLET BY MOUTH EVERY DAY AT 6:00PM - NEED TO KEEP APPOINTMENT BEFORE ANYMORE REFILLS   BLOOD GLUCOSE CALIBRATION (ACCU-CHEK GUIDE CONTROL) LIQD    Use for blood sugar machine testing. Dx: E11.42   BLOOD GLUCOSE MONITORING SUPPL (ACCU-CHEK GUIDE) W/DEVICE KIT  Use to test blood sugar twice daily. Dx: E11.9   CHOLECALCIFEROL (VITAMIN D-3) 125 MCG (5000 UT) TABS    Take 5,000 Units by mouth daily.   CLONIDINE (CATAPRES) 0.1 MG TABLET    TAKE ONE TABLET BY MOUTH TWICE A DAY MAY TAKE AN ADDITIONAL TABLET IF NEEDED SYSTOLIC BLOOD PRESSURE GREATER THAN 170   COLLAGENASE (SANTYL EX)    Apply 1 Application topically as needed.   GABAPENTIN (NEURONTIN) 300 MG CAPSULE    TAKE ONE CAPSULE BY MOUTH TWICE A DAY   GLUCOSE BLOOD (ACCU-CHEK GUIDE) TEST STRIP    Use to test blood sugar twice daily. Dx: E11.42   LOSARTAN (COZAAR) 100 MG TABLET    Take 1 tablet (100 mg total) by mouth daily.   METFORMIN (GLUCOPHAGE) 500 MG TABLET    Take 2  tablets (1,000 mg total) by mouth in the morning and at bedtime.   NON FORMULARY    Take 1 capsule by mouth See admin instructions. [employer]ics - UltraFlora Balance Daily Probiotic Immune Support* capsules- Take 1 capsule by mouth once a day   POLYETHYLENE GLYCOL (MIRALAX / GLYCOLAX) PACKET    Take 17 g by mouth daily as needed for mild constipation (MIX AND DRINK).    RIVAROXABAN (XARELTO) 15 MG TABS TABLET    TAKE ONE TABLET BY MOUTH EVERY DAY WITH SUPPER   ZINC GLUCONATE 50 MG TABLET    Take 50 mg by mouth daily.  Modified Medications   No medications on file  Discontinued Medications   FOLIC ACID (FOLVITE) 1 MG TABLET    Take 1 tablet (1 mg total) by mouth daily.    Physical Exam:  There were no vitals filed for this visit. There is no height or weight on file to calculate BMI. Wt Readings from Last 3 Encounters:  05/04/19 130 lb 1.1 oz (59 kg)  11/02/18 130 lb (59 kg)  01/30/18 130 lb (59 kg)    Physical Exam Constitutional:      Appearance: Normal appearance.  Pulmonary:     Effort: Pulmonary effort is normal.  Neurological:     Mental Status: She is alert. Mental status is at baseline.  Psychiatric:        Mood and Affect: Mood normal.     Labs reviewed: Basic Metabolic Panel: Recent Labs    01/14/22 1106 02/01/22 1448  NA 135 139  K 4.5 4.0  CL 96* 101  CO2 26 28  GLUCOSE 235* 182*  BUN 17 28*  CREATININE 0.66 0.54  CALCIUM 9.5 9.3   Liver Function Tests: Recent Labs    01/14/22 1106 02/01/22 1448  AST 12 12*  ALT 13 11  ALKPHOS  --  62  BILITOT 0.3 0.3  PROT 7.2 6.8  ALBUMIN  --  3.4*   No results for input(s): "LIPASE", "AMYLASE" in the last 8760 hours. No results for input(s): "AMMONIA" in the last 8760 hours. CBC: Recent Labs    01/14/22 1106 03/30/22 1349  WBC 3.4* 3.5*  NEUTROABS 1,812 1.8  HGB 8.7* 10.3*  HCT 28.3* 34.1*  MCV 80.4 88.6  PLT 267 227   Lipid Panel: Recent Labs    01/14/22 1106  CHOL 137  HDL 40*  LDLCALC 82   TRIG 71  CHOLHDL 3.4   TSH: No results for input(s): "TSH" in the last 8760 hours. A1C: Lab Results  Component Value Date   HGBA1C 8.3 (H) 01/14/2022     Assessment/Plan 1. Lethargy -has improved at this  time, continue proper hydration, nutrition and supportive care  2. Chronic idiopathic constipation -continue prune juice daily, to avoid constipation.   3. Essential hypertension -controlled at this time, family continues to monitor and gives medication as needed to control   4. Senile dementia without behavioral disturbance (HCC) -Stable, continue supportive care.   Carlos American. Harle Battiest  [facility] [phone]    Virtual Visit via mychart video  I connected with patient on 04/07/22 at 10:40 AM EST by video and verified that I am speaking with the correct person using two identifiers.  Location: Patient: home Provider: [facility]   I discussed the limitations, risks, security and privacy concerns of performing an evaluation and management service by telephone and the availability of in person appointments. I also discussed with the patient that there may be a patient responsible charge related to this service. The patient expressed understanding and agreed to proceed.   I discussed the assessment and treatment plan with the patient. The patient was provided an opportunity to ask questions and all were answered. The patient agreed with the plan and demonstrated an understanding of the instructions.   The patient was advised to call back or seek an in-person evaluation if the symptoms worsen or if the condition fails to improve as anticipated.  I provided 15 minutes of non-face-to-face time during this encounter.  Carlos American. Harle Battiest Avs printed and mailed

## 2022-04-11 DIAGNOSIS — I890 Lymphedema, not elsewhere classified: Secondary | ICD-10-CM | POA: Diagnosis not present

## 2022-04-11 DIAGNOSIS — E1140 Type 2 diabetes mellitus with diabetic neuropathy, unspecified: Secondary | ICD-10-CM | POA: Diagnosis not present

## 2022-04-11 DIAGNOSIS — F0390 Unspecified dementia without behavioral disturbance: Secondary | ICD-10-CM | POA: Diagnosis not present

## 2022-04-11 DIAGNOSIS — Z7984 Long term (current) use of oral hypoglycemic drugs: Secondary | ICD-10-CM | POA: Diagnosis not present

## 2022-04-11 DIAGNOSIS — L89324 Pressure ulcer of left buttock, stage 4: Secondary | ICD-10-CM | POA: Diagnosis not present

## 2022-04-11 DIAGNOSIS — L89893 Pressure ulcer of other site, stage 3: Secondary | ICD-10-CM | POA: Diagnosis not present

## 2022-04-12 ENCOUNTER — Other Ambulatory Visit: Payer: Self-pay | Admitting: *Deleted

## 2022-04-12 ENCOUNTER — Other Ambulatory Visit: Payer: Self-pay | Admitting: Nurse Practitioner

## 2022-04-12 DIAGNOSIS — E1142 Type 2 diabetes mellitus with diabetic polyneuropathy: Secondary | ICD-10-CM

## 2022-04-12 DIAGNOSIS — Z86718 Personal history of other venous thrombosis and embolism: Secondary | ICD-10-CM

## 2022-04-12 DIAGNOSIS — E1169 Type 2 diabetes mellitus with other specified complication: Secondary | ICD-10-CM

## 2022-04-12 DIAGNOSIS — I10 Essential (primary) hypertension: Secondary | ICD-10-CM

## 2022-04-12 MED ORDER — ATORVASTATIN CALCIUM 40 MG PO TABS: ORAL_TABLET | ORAL | 0 refills | Status: AC

## 2022-04-12 NOTE — Telephone Encounter (Signed)
[name] called and stated that patient needed refills on some medications and to send to [facility].   Clonidine-already sent Xarelto-already sent Meformin-already sent  Gabapentin-already sent  Also needing Atorvastatin.

## 2022-04-15 NOTE — Progress Notes (Signed)
[facility] RN NOTE  PATIENT NAME: [name] DOB: [birth year] MRN: [id number]  PRIMARY CARE PROVIDER: [name], NP  RESPONSIBLE PARTY: Sofie Rower (daughter in law) Acct ID - Guarantor Home Phone Work Phone Relationship Acct Type  [id number] [name], CANAN641 [phone]  Self P/F     [address] [place], [place] [address]   RN completed prn visit with patient and [name] in their home. Wound care nurse also present and hired caregiver.  Pain: Grimacing and some calling out heard with repositioning. Otherwise appears comfortable at rest.  Respirations: Breathing is even, regular and unlabored.  Cognitive: Patient appears more lethargic today. Barely able to open her eyes. Taking longer to wake her up. This started over the weekend. Daughter feels she may have had a TIA.   Appetite: Decreased intake over the past few days.. Taking more effort to swallow. Coughing/choking noted when drinking fluids today. Pureed diet. Cg has been able to get in slightly more food starting yesterday. She does pocket foods at time.  Cardiovascular: Heart rate elvated today at 109 bpm. Feel she may be dehydrated due to poor intake past few days. They are pushing more fluids as patient can tolerate.   Skin: Pressure injuries noted to both hips. Sacral wound is open with some tunneling noted. Wound care nurse present to dress the wounds. Nurse says usually when she changes patient's dressings she exhibits pain, but today slept through this. Same with bathing today.  Mobility: Total care with all ADLs. Mainly bedbound. Family will get her up at times to sit on the commode if she is having an issue with her bowels.  Goals of care: [name] wants to keep patient at home with comfort measures only unless symptoms cannot be managed in the home. Patient appears comfortable overall and without any distress. I feel patient is hospice eligible, however family wants to see if  patient rebounds. She knows to contact palliative care with any questions or concerns.   Daryl Eastern, RN BSN

## 2022-04-18 ENCOUNTER — Encounter (HOSPITAL_BASED_OUTPATIENT_CLINIC_OR_DEPARTMENT_OTHER): Payer: Medicare Other | Attending: Internal Medicine | Admitting: Internal Medicine

## 2022-04-18 DIAGNOSIS — E11622 Type 2 diabetes mellitus with other skin ulcer: Secondary | ICD-10-CM | POA: Insufficient documentation

## 2022-04-18 DIAGNOSIS — L89223 Pressure ulcer of left hip, stage 3: Secondary | ICD-10-CM | POA: Insufficient documentation

## 2022-04-18 DIAGNOSIS — G309 Alzheimer's disease, unspecified: Secondary | ICD-10-CM | POA: Diagnosis not present

## 2022-04-18 DIAGNOSIS — L89324 Pressure ulcer of left buttock, stage 4: Secondary | ICD-10-CM | POA: Diagnosis not present

## 2022-04-18 DIAGNOSIS — F02C0 Dementia in other diseases classified elsewhere, severe, without behavioral disturbance, psychotic disturbance, mood disturbance, and anxiety: Secondary | ICD-10-CM | POA: Diagnosis not present

## 2022-04-18 DIAGNOSIS — I10 Essential (primary) hypertension: Secondary | ICD-10-CM | POA: Diagnosis not present

## 2022-04-18 DIAGNOSIS — E1151 Type 2 diabetes mellitus with diabetic peripheral angiopathy without gangrene: Secondary | ICD-10-CM | POA: Diagnosis not present

## 2022-04-18 DIAGNOSIS — L89893 Pressure ulcer of other site, stage 3: Secondary | ICD-10-CM | POA: Insufficient documentation

## 2022-04-19 NOTE — Progress Notes (Signed)
AKHILA, MAHNKEN ([id number]) [id number] Page 1 of 12 Visit Report for 04/18/2022 Arrival Information Details Patient Name: Date of Service: IRINA, OKELLY 04/18/2022 12:45 PM Medical Record Number: [id number] Patient Account Number: [id number] Date of Birth/Sex: Treating RN: 05/21/1935 (86 y.o. Iver Nestle, [place] Primary Care Stormey Wilborn: [name] Other Clinician: Referring Barba Solt: Treating Zael Shuman/Extender: Deloria Lair in Treatment: 32 Visit Information History Since Last Visit Added or deleted any medications: No Patient Arrived: Ambulatory Any new allergies or adverse reactions: No Arrival Time: 13:10 Had a fall or experienced change in No Accompanied By: family activities of daily living that may affect Transfer Assistance: None risk of falls: Patient Identification Verified: Yes Signs or symptoms of abuse/neglect since last visito No Secondary Verification Process Completed: Yes Hospitalized since last visit: No Patient Requires Transmission-Based Precautions: No Implantable device outside of the clinic excluding No Patient Has Alerts: No cellular tissue based products placed in the center since last visit: Has Dressing in Place as Prescribed: Yes Pain Present Now: No Electronic Signature(s) Signed: 04/18/2022 4:43:42 PM By: Blanche East RN Entered By: Blanche East on 04/18/2022 13:11:49 -------------------------------------------------------------------------------- Clinic Level of Care Assessment Details Patient Name: Date of Service: ANTANIYA, VENUTI 04/18/2022 12:45 PM Medical Record Number: [id number] Patient Account Number: [id number] Date of Birth/Sex: Treating RN: 25-Jan-1936 (86 y.o. [name] Primary Care Mikel Pyon: [name] Other Clinician: Referring Bowe Sidor: Treating Lelan Cush/Extender: Deloria Lair in Treatment: 80 Clinic Level of Care Assessment  Items TOOL 4 Quantity Score X- 1 0 Use when only an EandM is performed on FOLLOW-UP visit ASSESSMENTS - Nursing Assessment / Reassessment X- 1 10 Reassessment of Co-morbidities (includes updates in patient status) X- 1 5 Reassessment of Adherence to Treatment Plan ASSESSMENTS - Wound and Skin A ssessment / Reassessment X - Simple Wound Assessment / Reassessment - one wound 1 5 [] - 0 Complex Wound Assessment / Reassessment - multiple wounds [] - 0 Dermatologic / Skin Assessment (not related to wound area) ASSESSMENTS - Focused Assessment [] - 0 Circumferential Edema Measurements - multi extremities [] - 0 Nutritional Assessment / Counseling / Intervention KYRIELLE, URBANSKI ([id number]) [id number] Page 2 of 12 [] - 0 Lower Extremity Assessment (monofilament, tuning fork, pulses) [] - 0 Peripheral Arterial Disease Assessment (using hand held doppler) ASSESSMENTS - Ostomy and/or Continence Assessment and Care [] - 0 Incontinence Assessment and Management [] - 0 Ostomy Care Assessment and Management (repouching, etc.) PROCESS - Coordination of Care X - Simple Patient / Family Education for ongoing care 1 15 [] - 0 Complex (extensive) Patient / Family Education for ongoing care X- 1 10 Staff obtains Programmer, systems, Records, T Results / Process Orders est [] - 0 Staff telephones HHA, Nursing Homes / Clarify orders / etc [] - 0 Routine Transfer to another Facility (non-emergent condition) [] - 0 Routine Hospital Admission (non-emergent condition) [] - 0 New Admissions / Biomedical engineer / Ordering NPWT Apligraf, etc. , [] - 0 Emergency Hospital Admission (emergent condition) X- 1 10 Simple Discharge Coordination [] - 0 Complex (extensive) Discharge Coordination PROCESS - Special Needs [] - 0 Pediatric / Minor Patient Management [] - 0 Isolation Patient Management [] - 0 Hearing / Language / Visual special needs [] - 0 Assessment of  Community assistance (transportation, D/C planning, etc.) [] - 0 Additional assistance / Altered mentation [] - 0 Support Surface(s) Assessment (bed, cushion, seat, etc.) INTERVENTIONS - Wound Cleansing / Measurement X - Simple Wound Cleansing - one wound 1 5 [] -  0 Complex Wound Cleansing - multiple wounds X- 1 5 Wound Imaging (photographs - any number of wounds) [] - 0 Wound Tracing (instead of photographs) [] - 0 Simple Wound Measurement - one wound X- 3 5 Complex Wound Measurement - multiple wounds INTERVENTIONS - Wound Dressings X - Small Wound Dressing one or multiple wounds 3 10 [] - 0 Medium Wound Dressing one or multiple wounds [] - 0 Large Wound Dressing one or multiple wounds X- 1 5 Application of Medications - topical [] - 0 Application of Medications - injection INTERVENTIONS - Miscellaneous [] - 0 External ear exam [] - 0 Specimen Collection (cultures, biopsies, blood, body fluids, etc.) [] - 0 Specimen(s) / Culture(s) sent or taken to Lab for analysis [] - 0 Patient Transfer (multiple staff / Civil Service fast streamer / Similar devices) [] - 0 Simple Staple / Suture removal (25 or less) [] - 0 Complex Staple / Suture removal (26 or more) [] - 0 Hypo / Hyperglycemic Management (close monitor of Blood Glucose) Yazdani, Rudene Christians ([id number]) [id number] Page 3 of 12 [] - 0 Ankle / Brachial Index (ABI) - do not check if billed separately X- 1 5 Vital Signs Has the patient been seen at the hospital within the last three years: Yes Total Score: 120 Level Of Care: New/Established - Level 4 Electronic Signature(s) Signed: 04/18/2022 4:59:49 PM By: Adline Peals Entered By: Adline Peals on 04/18/2022 14:19:17 -------------------------------------------------------------------------------- Encounter Discharge Information Details Patient Name: Date of Service: [name] 04/18/2022 12:45 PM Medical Record Number: [id number] Patient  Account Number: [id number] Date of Birth/Sex: Treating RN: 1936-01-10 (86 y.o. [name] Primary Care : [name] Other Clinician: Referring : Treating /Extender: Deloria Lair in Treatment: 23 Encounter Discharge Information Items Discharge Condition: Stable Ambulatory Status: Wheelchair Discharge Destination: Home Transportation: Private Auto Accompanied By: daughter Schedule Follow-up Appointment: Yes Clinical Summary of Care: Patient Declined Electronic Signature(s) Signed: 04/18/2022 4:59:49 PM By: Adline Peals Entered By: Adline Peals on 04/18/2022 13:49:27 -------------------------------------------------------------------------------- Lower Extremity Assessment Details Patient Name: Date of Service: AINE, STRYCHARZ 04/18/2022 12:45 PM Medical Record Number: [id number] Patient Account Number: [id number] Date of Birth/Sex: Treating RN: 1935-08-08 (86 y.o. Marta Lamas Primary Care : [name] Other Clinician: Referring : Treating /Extender: Deloria Lair in Treatment: 26 Electronic Signature(s) Signed: 04/18/2022 4:43:42 PM By: Blanche East RN Entered By: Blanche East on 04/18/2022 13:14:51 -------------------------------------------------------------------------------- Multi Wound Chart Details Patient Name: Date of Service: [name] 04/18/2022 12:45 PM Medical Record Number: [id number] Patient Account Number: [id number] AUDRI, KOZUB ([id number]) [id number] Page 4 of 12 Date of Birth/Sex: Treating RN: [birth year] (86 y.o. F) Primary Care : Other Clinician: [name] Referring : Treating /Extender: Deloria Lair in Treatment: 48 Vital Signs Height(in): 38 Capillary Blood Glucose(mg/dl): 120 Weight(lbs): 110 Pulse(bpm): 15 Body Mass  Index(BMI): 16.7 Blood Pressure(mmHg): 192/76 Temperature(F): 97.5 Respiratory Rate(breaths/min): 16 [2:Photos:] Left Gluteus Right, Proximal, Lateral Lower Leg Left Trochanter Wound Location: Pressure Injury Trauma Pressure Injury Wounding Event: Pressure Ulcer Pressure Ulcer Pressure Ulcer Primary Etiology: Anemia, Deep Vein Thrombosis, Anemia, Deep Vein Thrombosis, Anemia, Deep Vein Thrombosis, Comorbid History: Hypertension, Type II Diabetes, Hypertension, Type II Diabetes, Hypertension, Type II Diabetes, Dementia Dementia Dementia 09/18/2020 09/08/2021 09/17/2021 Date Acquired: 67 31 25 Weeks of Treatment: Open Open Open Wound Status: No No No Wound Recurrence: 4x3.2x2.6 0.8x0.9x0.1 1.7x2x0.1 Measurements L x W x D (cm) 10.053 0.565 2.67 A (cm) : rea 26.138 0.057 0.267 Volume (cm) : 75.90% -  378.80% -8512.90% % Reduction in A rea: -527.40% -375.00% -8800.00% % Reduction in Volume: 12 Starting Position 1 (o'clock): 12 Ending Position 1 (o'clock): 3.1 Maximum Distance 1 (cm): Yes N/A No Undermining: Category/Stage III Category/Stage II Unstageable/Unclassified Classification: Medium Medium Medium Exudate A mount: Serosanguineous Serosanguineous Serosanguineous Exudate Type: red, brown red, brown red, brown Exudate Color: Epibole Distinct, outline attached Flat and Intact Wound Margin: Medium (34-66%) None Present (0%) None Present (0%) Granulation A mount: Red, Pink N/A N/A Granulation Quality: Medium (34-66%) None Present (0%) None Present (0%) Necrotic A mount: Fat Layer (Subcutaneous Tissue): Yes Fascia: No Fascia: No Exposed Structures: Fascia: No Fat Layer (Subcutaneous Tissue): No Fat Layer (Subcutaneous Tissue): No Tendon: No Tendon: No Tendon: No Muscle: No Muscle: No Muscle: No Joint: No Joint: No Joint: No Bone: No Bone: No Bone: No Limited to Skin Breakdown Limited to Skin Breakdown Small (1-33%) Large (67-100%) Large  (67-100%) Epithelialization: Excoriation: Yes Scarring: Yes Scarring: Yes Periwound Skin Texture: Scarring: Yes Excoriation: No Induration: No Callus: No Crepitus: No Rash: No No Abnormalities Noted No Abnormalities Noted Maceration: No Periwound Skin Moisture: Dry/Scaly: No No Abnormalities Noted No Abnormalities Noted Atrophie Blanche: No Periwound Skin Color: Cyanosis: No Ecchymosis: No Erythema: No Hemosiderin Staining: No Mottled: No Pallor: No Rubor: No N/A No Abnormality No Abnormality Temperature: Treatment Notes Wound #2 (Gluteus) Wound Laterality: Left Cleanser [place], Rudene Christians ([id number]) [id number] Page 5 of 12 Soap and Water Discharge Instruction: May shower and wash wound with dial antibacterial soap and water prior to dressing change. Wound Cleanser Discharge Instruction: Cleanse the wound with wound cleanser prior to applying a clean dressing using gauze sponges, not tissue or cotton balls. Peri-Wound Care Topical Primary Dressing Dakin's Solution 0.25%, 16 (oz) Discharge Instruction: Moisten gauze with Dakin's solution Sorbalgon AG Dressing 2x2 (in/in) Discharge Instruction: Apply to wound bed as instructed Secondary Dressing Zetuvit Plus Silicone Border Dressing 5x5 (in/in) Discharge Instruction: Apply silicone border over primary dressing as directed. Secured With Compression Wrap Compression Stockings Add-Ons Wound #4 (Lower Leg) Wound Laterality: Right, Lateral, Proximal Cleanser Soap and Water Discharge Instruction: May shower and wash wound with dial antibacterial soap and water prior to dressing change. Wound Cleanser Discharge Instruction: Cleanse the wound with wound cleanser prior to applying a clean dressing using gauze sponges, not tissue or cotton balls. Peri-Wound Care Topical Primary Dressing KerraCel Ag Gelling Fiber Dressing, 2x2 in (silver alginate) Discharge Instruction: Apply silver alginate to  wound bed as instructed Secondary Dressing Bordered Gauze, 4x4 in Discharge Instruction: Apply over primary dressing as directed. Secured With Compression Wrap Compression Stockings Add-Ons Wound #6 (Trochanter) Wound Laterality: Left Cleanser Soap and Water Discharge Instruction: May shower and wash wound with dial antibacterial soap and water prior to dressing change. Wound Cleanser Discharge Instruction: Cleanse the wound with wound cleanser prior to applying a clean dressing using gauze sponges, not tissue or cotton balls. Peri-Wound Care Topical Primary Dressing Sorbalgon AG Dressing 2x2 (in/in) Discharge Instruction: Apply to wound bed as instructed Secondary Dressing Bordered Gauze, 4x4 in Discharge Instruction: Apply over primary dressing as directed. Secured With [place], Rudene Christians ([id number]) [id number] Page 6 of 12 Compression Wrap Compression Stockings Add-Ons Electronic Signature(s) Signed: 04/18/2022 5:01:06 PM By: Linton Ham MD Entered By: Linton Ham on 04/18/2022 13:51:29 -------------------------------------------------------------------------------- Multi-Disciplinary Care Plan Details Patient Name: Date of Service: CASY, TAVANO 04/18/2022 12:45 PM Medical Record Number: [id number] Patient Account Number: [id number] Date of Birth/Sex: Treating RN: 09-06-1935 (86 y.o. [name] Primary Care : [name] Other Clinician: Referring  Leala Bryand: Treating Kiran Carline/Extender: Deloria Lair in Treatment: 80 Active Inactive Wound/Skin Impairment Nursing Diagnoses: Impaired tissue integrity Goals: Patient/caregiver will verbalize understanding of skin care regimen Date Initiated: 10/02/2020 Target Resolution Date: 05/06/2022 Goal Status: Active Ulcer/skin breakdown will have a volume reduction of 30% by week 4 Date Initiated: 10/02/2020 Date Inactivated: 01/06/2021 Target  Resolution Date: 01/09/2021 Goal Status: Met Interventions: Assess patient/caregiver ability to obtain necessary supplies Assess patient/caregiver ability to perform ulcer/skin care regimen upon admission and as needed Assess ulceration(s) every visit Provide education on ulcer and skin care Treatment Activities: Skin care regimen initiated : 10/02/2020 Topical wound management initiated : 10/02/2020 Notes: 11/10/20 : Goal not yet met, eschar debrided off increasing wound size. Electronic Signature(s) Signed: 04/18/2022 4:59:49 PM By: Adline Peals Entered By: Adline Peals on 04/18/2022 13:41:35 -------------------------------------------------------------------------------- Pain Assessment Details Patient Name: Date of Service: VICY, MEDICO 04/18/2022 12:45 PM Medical Record Number: [id number] Patient Account Number: [id number] Date of Birth/Sex: Treating RN: 1935/05/15 (86 y[address]. [address] [place], [place] ([id number]) [id number] Page 7 of 12 Primary Care Shaughn Thomley: [name] Other Clinician: Referring Johannah Rozas: Treating Wai Minotti/Extender: Deloria Lair in Treatment: 38 Active Problems Location of Pain Severity and Description of Pain Patient Has Paino Patient Unable to Respond Site Locations Pain Management and Medication Current Pain Management: Electronic Signature(s) Signed: 04/18/2022 4:43:42 PM By: Blanche East RN Entered By: Blanche East on 04/18/2022 13:14:43 -------------------------------------------------------------------------------- Patient/Caregiver Education Details Patient Name: Date of Service: [name] 12/11/2023andnbsp12:45 PM Medical Record Number: [id number] Patient Account Number: [id number] Date of Birth/Gender: Treating RN: Jan 09, 1936 (86 y.o. [name] Primary Care Physician: [name] Other Clinician: Referring Physician: Treating Physician/Extender:  Deloria Lair in Treatment: 28 Education Assessment Education Provided To: Patient Education Topics Provided Wound/Skin Impairment: Methods: Explain/Verbal Responses: Reinforcements needed, State content correctly Electronic Signature(s) Signed: 04/18/2022 4:59:49 PM By: Adline Peals Entered By: Adline Peals on 04/18/2022 13:41:53 Thomasenia Bottoms ([id number]) [id number] Page 8 of 12 -------------------------------------------------------------------------------- Wound Assessment Details Patient Name: Date of Service: ALFIE, ALDERFER 04/18/2022 12:45 PM Medical Record Number: [id number] Patient Account Number: [id number] Date of Birth/Sex: Treating RN: [birth year] (86 y.o. Iver Nestle, Jamie Primary Care Jashua Knaak: [name] Other Clinician: Referring Leilana Mcquire: Treating Maliik Karner/Extender: Deloria Lair in Treatment: 45 Wound Status Wound Number: 2 Primary Pressure Ulcer Etiology: Wound Location: Left Gluteus Wound Status: Open Wounding Event: Pressure Injury Comorbid Anemia, Deep Vein Thrombosis, Hypertension, Type II Date Acquired: 09/18/2020 History: Diabetes, Dementia Weeks Of Treatment: 80 Clustered Wound: No Photos Wound Measurements Length: (cm) 4 Width: (cm) 3.2 Depth: (cm) 2.6 Area: (cm) 10.053 Volume: (cm) 26.138 % Reduction in Area: 75.9% % Reduction in Volume: -527.4% Epithelialization: Small (1-33%) Undermining: Yes Starting Position (o'clock): 12 Ending Position (o'clock): 12 Maximum Distance: (cm) 3.1 Wound Description Classification: Category/Stage III Wound Margin: Epibole Exudate Amount: Medium Exudate Type: Serosanguineous Exudate Color: red, brown Foul Odor After Cleansing: No Slough/Fibrino Yes Wound Bed Granulation Amount: Medium (34-66%) Exposed Structure Granulation Quality: Red, Pink Fascia Exposed: No Necrotic Amount: Medium (34-66%) Fat Layer  (Subcutaneous Tissue) Exposed: Yes Necrotic Quality: Adherent Slough Tendon Exposed: No Muscle Exposed: No Joint Exposed: No Bone Exposed: No Periwound Skin Texture Texture Color No Abnormalities Noted: No No Abnormalities Noted: Yes Excoriation: Yes Scarring: Yes Moisture No Abnormalities Noted: Yes Treatment Notes ALEXANDRYA, CHIM ([id number]) [id number] Page 9 of 12 Wound #2 (Gluteus) Wound Laterality: Left Cleanser Soap and Water Discharge Instruction: May shower and wash wound with dial antibacterial soap and  water prior to dressing change. Wound Cleanser Discharge Instruction: Cleanse the wound with wound cleanser prior to applying a clean dressing using gauze sponges, not tissue or cotton balls. Peri-Wound Care Topical Primary Dressing Dakin's Solution 0.25%, 16 (oz) Discharge Instruction: Moisten gauze with Dakin's solution Sorbalgon AG Dressing 2x2 (in/in) Discharge Instruction: Apply to wound bed as instructed Secondary Dressing Zetuvit Plus Silicone Border Dressing 5x5 (in/in) Discharge Instruction: Apply silicone border over primary dressing as directed. Secured With Compression Wrap Compression Stockings Environmental education officer) Signed: 04/18/2022 4:35:13 PM By: Sharyn Creamer RN, BSN Signed: 04/18/2022 4:43:42 PM By: Blanche East RN Entered By: Sharyn Creamer on 04/18/2022 13:24:18 -------------------------------------------------------------------------------- Wound Assessment Details Patient Name: Date of Service: KEANDRA, MEDERO 04/18/2022 12:45 PM Medical Record Number: [id number] Patient Account Number: [id number] Date of Birth/Sex: Treating RN: 02/01/1936 (86 y.o. Iver Nestle, Jamie Primary Care : [name] Other Clinician: Referring : Treating /Extender: Deloria Lair in Treatment: 80 Wound Status Wound Number: 4 Primary Pressure Ulcer Etiology: Wound  Location: Right, Proximal, Lateral Lower Leg Wound Status: Open Wounding Event: Trauma Comorbid Anemia, Deep Vein Thrombosis, Hypertension, Type II Date Acquired: 09/08/2021 History: Diabetes, Dementia Weeks Of Treatment: 31 Clustered Wound: No Photos Wound Measurements KENLY, HENCKEL ([id number]) Length: (cm) 0.8 Width: (cm) 0.9 Depth: (cm) 0.1 Area: (cm) 0.565 Volume: (cm) 0.057 [id number] Page 10 of 12 % Reduction in Area: -378.8% % Reduction in Volume: -375% Epithelialization: Large (67-100%) Tunneling: No Wound Description Classification: Category/Stage II Wound Margin: Distinct, outline attached Exudate Amount: Medium Exudate Type: Serosanguineous Exudate Color: red, brown Foul Odor After Cleansing: No Slough/Fibrino No Wound Bed Granulation Amount: None Present (0%) Exposed Structure Necrotic Amount: None Present (0%) Fascia Exposed: No Fat Layer (Subcutaneous Tissue) Exposed: No Tendon Exposed: No Muscle Exposed: No Joint Exposed: No Bone Exposed: No Limited to Skin Breakdown Periwound Skin Texture Texture Color No Abnormalities Noted: No No Abnormalities Noted: Yes Scarring: Yes Temperature / Pain Temperature: No Abnormality Moisture No Abnormalities Noted: Yes Treatment Notes Wound #4 (Lower Leg) Wound Laterality: Right, Lateral, Proximal Cleanser Soap and Water Discharge Instruction: May shower and wash wound with dial antibacterial soap and water prior to dressing change. Wound Cleanser Discharge Instruction: Cleanse the wound with wound cleanser prior to applying a clean dressing using gauze sponges, not tissue or cotton balls. Peri-Wound Care Topical Primary Dressing KerraCel Ag Gelling Fiber Dressing, 2x2 in (silver alginate) Discharge Instruction: Apply silver alginate to wound bed as instructed Secondary Dressing Bordered Gauze, 4x4 in Discharge Instruction: Apply over primary dressing as directed. Secured  With Compression Wrap Compression Stockings Environmental education officer) Signed: 04/18/2022 4:35:13 PM By: Sharyn Creamer RN, BSN Signed: 04/18/2022 4:43:42 PM By: Blanche East RN Entered By: Sharyn Creamer on 04/18/2022 13:23:44 Wound Assessment Details -------------------------------------------------------------------------------- Thomasenia Bottoms ([id number]) [id number] Page 11 of 12 Patient Name: Date of Service: LUCREZIA, DEHNE 04/18/2022 12:45 PM Medical Record Number: [id number] Patient Account Number: [id number] Date of Birth/Sex: Treating RN: 01/15/1936 (86 y.o. Iver Nestle, Jamie Primary Care : [name] Other Clinician: Referring : Treating /Extender: Deloria Lair in Treatment: 80 Wound Status Wound Number: 6 Primary Pressure Ulcer Etiology: Wound Location: Left Trochanter Wound Status: Open Wounding Event: Pressure Injury Comorbid Anemia, Deep Vein Thrombosis, Hypertension, Type II Date Acquired: 09/17/2021 History: Diabetes, Dementia Weeks Of Treatment: 25 Clustered Wound: No Photos Wound Measurements Length: (cm) 1.7 % Reduction in Area: -8512.9% Width: (cm) 2 % Reduction in Volume: -8800% Depth: (cm) 0.1 Epithelialization: Large (67-100%) Area: (cm) 2.67 Tunneling:  No Volume: (cm) 0.267 Undermining: No Wound Description Classification: Unstageable/Unclassified Foul Odor After Cleansing: No Wound Margin: Flat and Intact Slough/Fibrino No Exudate Amount: Medium Exudate Type: Serosanguineous Exudate Color: red, brown Wound Bed Granulation Amount: None Present (0%) Exposed Structure Necrotic Amount: None Present (0%) Fascia Exposed: No Fat Layer (Subcutaneous Tissue) Exposed: No Tendon Exposed: No Muscle Exposed: No Joint Exposed: No Bone Exposed: No Limited to Skin Breakdown Periwound Skin Texture Texture Color No Abnormalities Noted: No No Abnormalities Noted:  No Callus: No Atrophie Blanche: No Crepitus: No Cyanosis: No Excoriation: No Ecchymosis: No Induration: No Erythema: No Rash: No Hemosiderin Staining: No Scarring: Yes Mottled: No Pallor: No Moisture Rubor: No No Abnormalities Noted: Yes Temperature / Pain Temperature: No Abnormality Treatment Notes Wound #6 (Trochanter) Wound Laterality: Left Cleanser Soap and [address] [place], Rudene Christians ([id number]) [id number] Page 12 of 12 Discharge Instruction: May shower and wash wound with dial antibacterial soap and water prior to dressing change. Wound Cleanser Discharge Instruction: Cleanse the wound with wound cleanser prior to applying a clean dressing using gauze sponges, not tissue or cotton balls. Peri-Wound Care Topical Primary Dressing Sorbalgon AG Dressing 2x2 (in/in) Discharge Instruction: Apply to wound bed as instructed Secondary Dressing Bordered Gauze, 4x4 in Discharge Instruction: Apply over primary dressing as directed. Secured With Compression Wrap Compression Stockings Environmental education officer) Signed: 04/18/2022 4:35:13 PM By: Sharyn Creamer RN, BSN Signed: 04/18/2022 4:43:42 PM By: Blanche East RN Entered By: Sharyn Creamer on 04/18/2022 13:25:28 -------------------------------------------------------------------------------- Vitals Details Patient Name: Date of Service: JAE, BRUCK 04/18/2022 12:45 PM Medical Record Number: [id number] Patient Account Number: [id number] Date of Birth/Sex: Treating RN: 07/26/1935 (86 y.o. Iver Nestle, Jamie Primary Care : [name] Other Clinician: Referring : Treating /Extender: Deloria Lair in Treatment: 23 Vital Signs Time Taken: 13:11 Temperature (F): 97.5 Height (in): 68 Pulse (bpm): 70 Weight (lbs): 110 Respiratory Rate (breaths/min): 16 Body Mass Index (BMI): 16.7 Blood Pressure (mmHg): 192/76 Capillary Blood Glucose  (mg/dl): 120 Reference Range: 80 - 120 mg / dl Electronic Signature(s) Signed: 04/18/2022 4:43:42 PM By: Blanche East RN Entered By: Blanche East on 04/18/2022 13:14:33

## 2022-04-19 NOTE — Progress Notes (Signed)
[name], [name] ([id number]) [id number] Page 1 of 8 Visit Report for 04/18/2022 HPI Details Patient Name: Date of Service: [name], [name] 04/18/2022 12:45 PM Medical Record Number: [id number] Patient Account Number: [id number] Date of Birth/Sex: Treating RN: 12-Oct-1935 (86 y.o. F) Primary Care Provider: [name] Other Clinician: Referring Provider: Treating Provider/Extender: Deloria Lair in Treatment: 96 History of Present Illness HPI Description: Admission 5/27 Ms. Acacia Latorre is an 86 year old female with a past medical history of type 2 diabetes on oral agents, hypertension and dementia that presents today to the clinic for a wound to her right buttocks. Her daughter-in-law is present today and helps provide the history. This issue has been going on for 2 to 3 weeks now. She states she developed an eschar about 1 week ago. She has been taking doxycycline for possible soft tissue infection to the area prescribed by her primary care provider. Patient currently denies any signs of infection. 6/3; patient admitted to the clinic last week. She has a large necrotic area on her left buttock. They use Santyl last week. On arrival this visit she had completely necrotic surface with open to subcutaneous tissue. The surface was completely nonviable. They have been using Santyl. She apparently had been on doxycycline which she is just finishing prescribed by her primary doctor for possible soft tissue infection 6/7; patient with a large necrotic wound over her left buttock. Aggressive debridement last week. Culture of this grew Proteus unfortunately would not of been covered well or at least predictably by the doxycycline that her primary doctor put her on. I have therefore put her on Augmentin suspension 3 times a day. We are using silver alginate as the primary dressing while we deal with the infection. Her daughter has a list of  concerns including swallowing difficulties, concerns about aspiration. The patient has advanced dementia. If this is Alzheimer's disease it is at its preterminal stages. I went over that swallowing difficulties are part of what happens in the preterminal stages of Alzheimer's disease. Nevertheless we will family members seems to want to pursue a very aggressive course 6/21 large wound over her left buttock. She has completed the antibiotics I gave her [Augmentin]. We are using silver alginate while we are dealing with the infection and also to help with the drainage In general the wound looks better she has healthy granulation over perhaps 70% of it. Superiorly there is still necrotic debris I did not attempt to debride this today The patient has advanced dementia. I do not know that she could tolerate a wound VAC. She also has double incontinence which would make that difficult. Nevertheless she has been cared for diligently by her family. She is apparently eating well and may be 2 to 3 hours on this area all day 7/5; 2-week follow-up. She continues with a nice improvement in overall condition of the wound bed. The undermining area has still some adherent surface slough. We have been using silver alginate because of the drainage and possibility of at least superficial wound infection/colonization. Although I said in previous notes I wondered whether she could tolerate the wound VAC they have done so well with this wound at home I do not want to necessarily rule her out for a wound VAC and I am going to direct the start of a trial of wound VAC therapy if this can be covered by insurance, if we have home health support to change it at all 7/19; we did not get very far with the  wound VAC because my original ICD 10 said this was unstageable. As usual this represents a considerable frustration because we would not of ordered a wound VAC if the wound was still unstageable. This currently represents a stage  IV staging. This is all the way down through muscle layer. There is a rim of tissue over the bone and no palpable exposed bone but this is a deep wound. Fortunately at present no evidence of infection. We have been using silver collagen with backing wet-to-dry. The wound is really no better today but no worse 8/2; patient presents for 2-week follow-up. She has been using the wound VAC for the past week. There has been some issues with keeping the drape in place however home health comes out to reapply the drape when this happens. Overall there are no issues or complaints today. No signs of infection. 8/17; patient has been using the wound VAC on her wound on the upper left buttock. About 50 to 60% of this area is fully granulated however from about 10-5 o'clock there is still undermining. UNFORTUNATELY she also arrives in clinic today with a wound over her right fibular head. According to her daughter has been there for about 2 weeks. She wonders whether there is an abrasion although no concrete history of this. Given her frail status this is probably most likely a pressure ulcer over a bony prominence [fibular head] 8/31 unfortunately the upper left buttock pressure ulcer stage IV is not as optimistically better as I was hoping. The granulation that I identified on 12/23/20 is no longer adherent at the inferior pole the undermining is quite extensive but there is no palpable bone. No evidence of surrounding infection. There may be some improvement in the undermining measurements We are still dealing with the additional wound we identified 2 weeks ago on the right fibular head as well we have been using silver alginate here 9/14; 1 month follow-up. She has now had the wound VAC on for probably 2 months. No major change here if she still has the tunneling area at 12:00 and undermining from about 6-12. Surface of the wound does not look healthy. We use MolecuLight to look at the wound surface which  actually looked negative however she has extensive immunofluorescence in the wound edge from about 12-6 o'clock this actually extends beyond the visible wound margin by quite amount. 9/27; no major change in the left buttock wound. There is still undermining however no exposed bone. Granulation looks reasonable. Right fibular head is much smaller We are using Hydrofera Blue on the right fibular head silver alginate on the buttock area 10/12; left buttock wound is large with undermining. I think this is about the same as last time granulation looks reasonable there is no exposed bone. The area on the fibular head is almost completely closed 11/30; left buttock wound large wound with undermining superiorly. As her daughter points out there is some change in the granulation superiorly and more darker red color also some raised areas of hypergranulation. I cannot tell that the area is infected. We are using silver alginate she is changing this daily On the right fibular head the area is just about closed Melody [place], Rudene Christians ([id number]) [id number] Page 2 of 8 12/28; the patient's wound over her right fibular head is closed. Her left buttock ulcer looks actually a little better. Measurements are slightly improved. There is no exposed bone. The patient's daughter came in asking me about more aggressive options to attempt to get closure of  the subcu wound including surgery. I thought we had mostly agreed that this would be a palliative type setting and that I did not expect this to actually close nevertheless I tried to answer her questions. I do not think the patient is a candidate for a myocutaneous flap. She is simply too frail and I doubt plastic surgery would consider her a viable candidate. She might be a candidate for an advanced treatment option like Oasis which sometimes can stimulate granulation to get these wounds to gradually close. I told her in order for this to make  sense she would need probably a Foley catheter to control the incontinence and unfortunately weekly visits which would be difficult on this patient 1/25; 1 month follow-up. The patient has a large stage IV left buttock ulcer. Substantially deep but no exposed bone. She has advanced dementia. I do not think she is a candidate for anything aggressive and I follow her on a palliative basis. I been over this with her daughter many times. We have been using silver alginate backing ABDs. She has a level 3 surface on her mattress in spite of this she developed some degree of erythema today on the upper left pelvis 2/22; 1 month follow-up. Large left buttock pressure ulcer. We've been using silver alginate as a palliative dressing. Essentially deep wound with no exposed bone. No evidence of surrounding infection. This patient has advanced/preterminal dementia. Her daughter reports that he had a time where she was not eating very well although she seems to be improved lately. she does not appear to be systemically unwell 07/28/2021: 1 month follow-up. The overall wound dimensions are little bit smaller but she does have significant undermining and epibole. They have been using silver alginate in the wound. There is a small amount of slough buildup. The daughter-in-law that is with her today also expressed concern about some discoloration on the patient's left trochanter and right fibular head. 09/08/2021: 6-week follow-up. No significant change to the left buttock ulcer. There is a bit of slough buildup on the surface but it is otherwise clean without significant drainage or odor. The left trochanter looks like it may have partially broken down and then subsequently healed without intervention; there is some eschar overlying this and when I removed it, there was fresh pink tissue but no opening. On the right fibular head, this site has opened and there are 2 small wounds with a bit of slough accumulation. No odor  or drainage. 10/20/2021: 6-week follow-up. No real change to the left buttock ulcer. The left trochanter is open now. It is fairly superficial but does extend into the fat layer. The right fibular head is clean and limited to the skin. She has a new area of tissue breakdown overlying her sacrum, just medial to her large left buttock ulcer. This looks like it is secondary to shear. 12/01/2021: 6-week follow-up. The left buttock ulcer remains unchanged. There is a light layer of slough and biofilm on the surface. The left trochanter has a heavy layer of eschar on it. The right fibular head wound is deeper today and has heavy slough accumulation. The area of tissue breakdown on her sacrum that was seen at her last visit has healed. 12/24/2021: The patient's daughter-in-law brought her to clinic today, earlier than her usual 6-week interval, because she had a lot more pain and drainage coming from her left trochanter. On inspection, the area is fluctuant and when pressure was applied, frank pus began draining from underneath the fibrinous  eschar overlying the wound. The wound on her right fibular head is a little bit smaller but has some nonviable subcutaneous tissue underneath some slough. The sacral wound is unchanged with just a light layer of slough accumulation. 01/12/2022: The culture that I took from the wound on her left trochanter grew out a polymicrobial collection including Proteus mirabilis, strep, Enterococcus faecalis, Escherichia coli and multiple staph species. There was macrolide and tetracycline resistance. I prescribed a 10-day course of Augmentin and we have been applying topical gentamicin to the wound. She finished her oral antibiotics. Today, her sacral wound is unchanged. The left trochanter wound is substantially cleaner. There is still some slough and nonviable subcutaneous tissue present. The wound on her right fibular head has some dark fibrinous eschar that is fairly densely  adherent. The home health nurse had recommended to the patient's daughter-in-law that she apply Santyl to this location. 01/24/2022: The sacral wound is utterly unchanged. The left trochanter wound is cleaner, smaller, and shallower. The wound on the right fibular head continues to have slough accumulate but the fibrinous eschar has been successfully broken down by Santyl. 02/21/2022: The sacral wound remains unchanged. The left trochanter and right fibular head wounds are substantially smaller with just a bit of slough accumulation. 03/21/2022: The sacral wound has contracted somewhat and has less undermining. There is a layer of slough on the wound surface. The left trochanter wound is down to just a tiny pinhole and the right fibular head has also contracted considerably. There is some slough and eschar on the surface. 12/11; sacral wound is still a large wound although the surface tissue looks better than what I remember. We have been using Dakin's wet-to-dry however the daughter changed her to silver alginate in the last week or 2 because of drainage. She also has an area on the left greater trochanter and left lower leg Electronic Signature(s) Signed: 04/18/2022 5:01:06 PM By: Linton Ham MD Entered By: Linton Ham on 04/18/2022 13:52:32 -------------------------------------------------------------------------------- Physical Exam Details Patient Name: Date of Service: [name], [name] 04/18/2022 12:45 PM Medical Record Number: [id number] Patient Account Number: [id number] Date of Birth/Sex: Treating RN: December 12, 1935 (86 y.o. F) Primary Care Provider: [name] Other Clinician: Referring Provider: Treating Provider/Extender: Deloria Lair in Treatment: 87 Constitutional Patient is hypertensive.. Pulse regular and within target range for patient.Marland Kitchen Respirations regular, non-labored and within target range.. Temperature is normal and within the target  range for the patient.Marland Kitchen Appears in no distress. SHEILA, OCASIO ([id number]) [id number] Page 3 of 8 Notes Wound exam; sacral wound still has undermining although the surface looks better than what I remember. At the maximal point of there is still a large amount of slough. No debridement. Left greater trochanter has reopened this week but appears superficial -Left lower leg also is contracted somewhat Electronic Signature(s) Signed: 04/18/2022 5:01:06 PM By: Linton Ham MD Entered By: Linton Ham on 04/18/2022 13:54:42 -------------------------------------------------------------------------------- Physician Orders Details Patient Name: Date of Service: [name], [name] 04/18/2022 12:45 PM Medical Record Number: [id number] Patient Account Number: [id number] Date of Birth/Sex: Treating RN: 06/14/1935 (86 y.o. [name] Primary Care Provider: [name] Other Clinician: Referring Provider: Treating Provider/Extender: Deloria Lair in Treatment: 51 Verbal / Phone Orders: No Diagnosis Coding Follow-up Appointments ppointment in: - 6 weeks Return A Anesthetic (In clinic) Topical Lidocaine 5% applied to wound bed - In clinic Bathing/ Shower/ Hygiene May shower and wash wound with soap and water. - prior to dressing change Off-Loading Low  air-loss mattress (Group 2) Turn and reposition every 2 hours Additional Orders / Instructions Follow Nutritious Diet - 100-120g of Protein Non Wound Condition Bilateral Lower Extremities Protect area with: - Bilateral Lateral Knees , and Right Trochanter. Protect with Silicone Foam Borders or ABD pads [place] wound care orders this week; continue Home Health for wound care. May utilize formulary equivalent dressing for wound treatment orders unless otherwise specified. - Silver Alginate to R leg ; Silver alginate to L trochanter Dakins to gluteus Other Home Health  Orders/Instructions: - [id number] Wound Treatment Wound #2 - Gluteus Wound Laterality: Left Cleanser: Soap and Water Every Other Day/30 Days Discharge Instructions: May shower and wash wound with dial antibacterial soap and water prior to dressing change. Cleanser: Wound Cleanser Every Other Day/30 Days Discharge Instructions: Cleanse the wound with wound cleanser prior to applying a clean dressing using gauze sponges, not tissue or cotton balls. Prim Dressing: Dakin's Solution 0.25%, 16 (oz) (Generic) Every Other Day/30 Days ary Discharge Instructions: Moisten gauze with Dakin's solution Prim Dressing: Sorbalgon AG Dressing 2x2 (in/in) Every Other Day/30 Days ary Discharge Instructions: Apply to wound bed as instructed Secondary Dressing: Zetuvit Plus Silicone Border Dressing 5x5 (in/in) Every Other Day/30 Days Discharge Instructions: Apply silicone border over primary dressing as directed. Wound #4 - Lower Leg Wound Laterality: Right, Lateral, Proximal Cleanser: Soap and Water Every Other Day/30 Days Discharge Instructions: May shower and wash wound with dial antibacterial soap and water prior to dressing change. JASLYNNE, DAHAN ([id number]) [id number] Page 4 of 8 Cleanser: Wound Cleanser Every Other Day/30 Days Discharge Instructions: Cleanse the wound with wound cleanser prior to applying a clean dressing using gauze sponges, not tissue or cotton balls. Prim Dressing: KerraCel Ag Gelling Fiber Dressing, 2x2 in (silver alginate) (Generic) Every Other Day/30 Days ary Discharge Instructions: Apply silver alginate to wound bed as instructed Secondary Dressing: Bordered Gauze, 4x4 in (Generic) Every Other Day/30 Days Discharge Instructions: Apply over primary dressing as directed. Wound #6 - Trochanter Wound Laterality: Left Cleanser: Soap and Water Every Other Day/30 Days Discharge Instructions: May shower and wash wound with dial antibacterial soap and water prior  to dressing change. Cleanser: Wound Cleanser Every Other Day/30 Days Discharge Instructions: Cleanse the wound with wound cleanser prior to applying a clean dressing using gauze sponges, not tissue or cotton balls. Prim Dressing: Sorbalgon AG Dressing 2x2 (in/in) Every Other Day/30 Days ary Discharge Instructions: Apply to wound bed as instructed Secondary Dressing: Bordered Gauze, 4x4 in (Generic) Every Other Day/30 Days Discharge Instructions: Apply over primary dressing as directed. Patient Medications llergies: No Known Allergies A Notifications Medication Indication Start End 04/18/2022 lidocaine DOSE topical 4 % cream - cream topical Electronic Signature(s) Signed: 04/18/2022 4:59:49 PM By: Adline Peals Signed: 04/18/2022 5:01:06 PM By: Linton Ham MD Entered By: Adline Peals on 04/18/2022 13:48:21 -------------------------------------------------------------------------------- Problem List Details Patient Name: Date of Service: [name], [name] 04/18/2022 12:45 PM Medical Record Number: [id number] Patient Account Number: [id number] Date of Birth/Sex: Treating RN: 12-Sep-1935 (86 y.o. F) Primary Care Provider: [name] Other Clinician: Referring Provider: Treating Provider/Extender: Deloria Lair in Treatment: 36 Active Problems ICD-10 Encounter Code Description Active Date MDM Diagnosis L89.324 Pressure ulcer of left buttock, stage 4 11/24/2020 No Yes E11.9 Type 2 diabetes mellitus without complications 01/11/1739 No Yes L89.223 Pressure ulcer of left hip, stage 3 07/28/2021 No Yes L89.893 Pressure ulcer of other site, stage 3 07/28/2021 No Yes [name], [name] ([id number]) [phone].pdf Page 5 of 8 Inactive Problems ICD-10  Code Description Active Date Inactive Date L89.320 Pressure ulcer of left buttock, unstageable 10/13/2020 10/13/2020 L89.93 Pressure ulcer of unspecified site, stage 3 12/23/2020  12/23/2020 Resolved Problems Electronic Signature(s) Signed: 04/18/2022 5:01:06 PM By: Linton Ham MD Entered By: Linton Ham on 04/18/2022 13:51:11 -------------------------------------------------------------------------------- Progress Note Details Patient Name: Date of Service: [name] 04/18/2022 12:45 PM Medical Record Number: [id number] Patient Account Number: [id number] Date of Birth/Sex: Treating RN: [birth year] (86 y.o. F) Primary Care Provider: [name] Other Clinician: Referring Provider: Treating Provider/Extender: Deloria Lair in Treatment: 80 Subjective History of Present Illness (HPI) Admission 5/27 Ms. Asa Fath is an 86 year old female with a past medical history of type 2 diabetes on oral agents, hypertension and dementia that presents today to the clinic for a wound to her right buttocks. Her daughter-in-law is present today and helps provide the history. This issue has been going on for 2 to 3 weeks now. She states she developed an eschar about 1 week ago. She has been taking doxycycline for possible soft tissue infection to the area prescribed by her primary care provider. Patient currently denies any signs of infection. 6/3; patient admitted to the clinic last week. She has a large necrotic area on her left buttock. They use Santyl last week. On arrival this visit she had completely necrotic surface with open to subcutaneous tissue. The surface was completely nonviable. They have been using Santyl. She apparently had been on doxycycline which she is just finishing prescribed by her primary doctor for possible soft tissue infection 6/7; patient with a large necrotic wound over her left buttock. Aggressive debridement last week. Culture of this grew Proteus unfortunately would not of been covered well or at least predictably by the doxycycline that her primary doctor put her on. I have therefore put her on Augmentin  suspension 3 times a day. We are using silver alginate as the primary dressing while we deal with the infection. Her daughter has a list of concerns including swallowing difficulties, concerns about aspiration. The patient has advanced dementia. If this is Alzheimer's disease it is at its preterminal stages. I went over that swallowing difficulties are part of what happens in the preterminal stages of Alzheimer's disease. Nevertheless we will family members seems to want to pursue a very aggressive course 6/21 large wound over her left buttock. She has completed the antibiotics I gave her [Augmentin]. We are using silver alginate while we are dealing with the infection and also to help with the drainage In general the wound looks better she has healthy granulation over perhaps 70% of it. Superiorly there is still necrotic debris I did not attempt to debride this today The patient has advanced dementia. I do not know that she could tolerate a wound VAC. She also has double incontinence which would make that difficult. Nevertheless she has been cared for diligently by her family. She is apparently eating well and may be 2 to 3 hours on this area all day 7/5; 2-week follow-up. She continues with a nice improvement in overall condition of the wound bed. The undermining area has still some adherent surface slough. We have been using silver alginate because of the drainage and possibility of at least superficial wound infection/colonization. Although I said in previous notes I wondered whether she could tolerate the wound VAC they have done so well with this wound at home I do not want to necessarily rule her out for a wound VAC and I am going to direct the  start of a trial of wound VAC therapy if this can be covered by insurance, if we have home health support to change it at all 7/19; we did not get very far with the wound VAC because my original ICD 10 said this was unstageable. As usual this represents  a considerable frustration because we would not of ordered a wound VAC if the wound was still unstageable. This currently represents a stage IV staging. This is all the way down through muscle layer. There is a rim of tissue over the bone and no palpable exposed bone but this is a deep wound. Fortunately at present no evidence of infection. We have been using silver collagen with backing wet-to-dry. The wound is really no better today but no worse 8/2; patient presents for 2-week follow-up. She has been using the wound VAC for the past week. There has been some issues with keeping the drape in place however home health comes out to reapply the drape when this happens. Overall there are no issues or complaints today. No signs of infection. 8/17; patient has been using the wound VAC on her wound on the upper left buttock. About 50 to 60% of this area is fully granulated however from about 10-5 o'clock there is still undermining. UNFORTUNATELY she also arrives in clinic today with a wound over her right fibular head. According to her daughter has been there for about 2 weeks. She wonders whether there is an abrasion although no concrete history of this. Given her frail status this is probably most likely a pressure ulcer over a bony [place], Rudene Christians ([id number]) [id number].pdf Page 6 of 8 prominence [fibular head] 8/31 unfortunately the upper left buttock pressure ulcer stage IV is not as optimistically better as I was hoping. The granulation that I identified on 12/23/20 is no longer adherent at the inferior pole the undermining is quite extensive but there is no palpable bone. No evidence of surrounding infection. There may be some improvement in the undermining measurements We are still dealing with the additional wound we identified 2 weeks ago on the right fibular head as well we have been using silver alginate here 9/14; 1 month follow-up. She has now had the wound VAC on for  probably 2 months. No major change here if she still has the tunneling area at 12:00 and undermining from about 6-12. Surface of the wound does not look healthy. We use MolecuLight to look at the wound surface which actually looked negative however she has extensive immunofluorescence in the wound edge from about 12-6 o'clock this actually extends beyond the visible wound margin by quite amount. 9/27; no major change in the left buttock wound. There is still undermining however no exposed bone. Granulation looks reasonable. oo Right fibular head is much smaller We are using Hydrofera Blue on the right fibular head silver alginate on the buttock area 10/12; left buttock wound is large with undermining. I think this is about the same as last time granulation looks reasonable there is no exposed bone. The area on the fibular head is almost completely closed 11/30; left buttock wound large wound with undermining superiorly. As her daughter points out there is some change in the granulation superiorly and more darker red color also some raised areas of hypergranulation. I cannot tell that the area is infected. We are using silver alginate she is changing this daily On the right fibular head the area is just about closed 12/28; the patient's wound over her right fibular head is  closed. Her left buttock ulcer looks actually a little better. Measurements are slightly improved. There is no exposed bone. The patient's daughter came in asking me about more aggressive options to attempt to get closure of the subcu wound including surgery. I thought we had mostly agreed that this would be a palliative type setting and that I did not expect this to actually close nevertheless I tried to answer her questions. I do not think the patient is a candidate for a myocutaneous flap. She is simply too frail and I doubt plastic surgery would consider her a viable candidate. She might be a candidate for an advanced  treatment option like Oasis which sometimes can stimulate granulation to get these wounds to gradually close. I told her in order for this to make sense she would need probably a Foley catheter to control the incontinence and unfortunately weekly visits which would be difficult on this patient 1/25; 1 month follow-up. The patient has a large stage IV left buttock ulcer. Substantially deep but no exposed bone. She has advanced dementia. I do not think she is a candidate for anything aggressive and I follow her on a palliative basis. I been over this with her daughter many times. We have been using silver alginate backing ABDs. She has a level 3 surface on her mattress in spite of this she developed some degree of erythema today on the upper left pelvis 2/22; 1 month follow-up. Large left buttock pressure ulcer. We've been using silver alginate as a palliative dressing. Essentially deep wound with no exposed bone. No evidence of surrounding infection. This patient has advanced/preterminal dementia. Her daughter reports that he had a time where she was not eating very well although she seems to be improved lately. she does not appear to be systemically unwell 07/28/2021: 1 month follow-up. The overall wound dimensions are little bit smaller but she does have significant undermining and epibole. They have been using silver alginate in the wound. There is a small amount of slough buildup. The daughter-in-law that is with her today also expressed concern about some discoloration on the patient's left trochanter and right fibular head. 09/08/2021: 6-week follow-up. No significant change to the left buttock ulcer. There is a bit of slough buildup on the surface but it is otherwise clean without significant drainage or odor. The left trochanter looks like it may have partially broken down and then subsequently healed without intervention; there is some eschar overlying this and when I removed it, there was fresh  pink tissue but no opening. On the right fibular head, this site has opened and there are 2 small wounds with a bit of slough accumulation. No odor or drainage. 10/20/2021: 6-week follow-up. No real change to the left buttock ulcer. The left trochanter is open now. It is fairly superficial but does extend into the fat layer. The right fibular head is clean and limited to the skin. She has a new area of tissue breakdown overlying her sacrum, just medial to her large left buttock ulcer. This looks like it is secondary to shear. 12/01/2021: 6-week follow-up. The left buttock ulcer remains unchanged. There is a light layer of slough and biofilm on the surface. The left trochanter has a heavy layer of eschar on it. The right fibular head wound is deeper today and has heavy slough accumulation. The area of tissue breakdown on her sacrum that was seen at her last visit has healed. 12/24/2021: The patient's daughter-in-law brought her to clinic today, earlier than her  usual 6-week interval, because she had a lot more pain and drainage coming from her left trochanter. On inspection, the area is fluctuant and when pressure was applied, frank pus began draining from underneath the fibrinous eschar overlying the wound. The wound on her right fibular head is a little bit smaller but has some nonviable subcutaneous tissue underneath some slough. The sacral wound is unchanged with just a light layer of slough accumulation. 01/12/2022: The culture that I took from the wound on her left trochanter grew out a polymicrobial collection including Proteus mirabilis, strep, Enterococcus faecalis, Escherichia coli and multiple staph species. There was macrolide and tetracycline resistance. I prescribed a 10-day course of Augmentin and we have been applying topical gentamicin to the wound. She finished her oral antibiotics. Today, her sacral wound is unchanged. The left trochanter wound is substantially cleaner. There is still  some slough and nonviable subcutaneous tissue present. The wound on her right fibular head has some dark fibrinous eschar that is fairly densely adherent. The home health nurse had recommended to the patient's daughter-in-law that she apply Santyl to this location. 01/24/2022: The sacral wound is utterly unchanged. The left trochanter wound is cleaner, smaller, and shallower. The wound on the right fibular head continues to have slough accumulate but the fibrinous eschar has been successfully broken down by Santyl. 02/21/2022: The sacral wound remains unchanged. The left trochanter and right fibular head wounds are substantially smaller with just a bit of slough accumulation. 03/21/2022: The sacral wound has contracted somewhat and has less undermining. There is a layer of slough on the wound surface. The left trochanter wound is down to just a tiny pinhole and the right fibular head has also contracted considerably. There is some slough and eschar on the surface. 12/11; sacral wound is still a large wound although the surface tissue looks better than what I remember. We have been using Dakin's wet-to-dry however the daughter changed her to silver alginate in the last week or 2 because of drainage. She also has an area on the left greater trochanter and left lower leg Objective [name], [name] ([id number]) [id number] Page 7 of 8 Constitutional Patient is hypertensive.. Pulse regular and within target range for patient.Marland Kitchen Respirations regular, non-labored and within target range.. Temperature is normal and within the target range for the patient.Marland Kitchen Appears in no distress. Vitals Time Taken: 1:11 PM, Height: 68 in, Weight: 110 lbs, BMI: 16.7, Temperature: 97.5 F, Pulse: 70 bpm, Respiratory Rate: 16 breaths/min, Blood Pressure: 192/76 mmHg, Capillary Blood Glucose: 120 mg/dl. General Notes: Wound exam; sacral wound still has undermining although the surface looks better than  what I remember. At the maximal point of there is still a large amount of slough. No debridement. oo Left greater trochanter has reopened this week but appears superficial -Left lower leg also is contracted somewhat Integumentary (Hair, Skin) Wound #2 status is Open. Original cause of wound was Pressure Injury. The date acquired was: 09/18/2020. The wound has been in treatment 80 weeks. The wound is located on the Left Gluteus. The wound measures 4cm length x 3.2cm width x 2.6cm depth; 10.053cm^2 area and 26.138cm^3 volume. There is Fat Layer (Subcutaneous Tissue) exposed. There is undermining starting at 12:00 and ending at 12:00 with a maximum distance of 3.1cm. There is a medium amount of serosanguineous drainage noted. The wound margin is epibole. There is medium (34-66%) red, pink granulation within the wound bed. There is a medium (34-66%) amount of necrotic tissue within the wound bed including Adherent  Slough. The periwound skin appearance had no abnormalities noted for moisture. The periwound skin appearance had no abnormalities noted for color. The periwound skin appearance exhibited: Excoriation, Scarring. Wound #4 status is Open. Original cause of wound was Trauma. The date acquired was: 09/08/2021. The wound has been in treatment 31 weeks. The wound is located on the Right,Proximal,Lateral Lower Leg. The wound measures 0.8cm length x 0.9cm width x 0.1cm depth; 0.565cm^2 area and 0.057cm^3 volume. The wound is limited to skin breakdown. There is no tunneling noted. There is a medium amount of serosanguineous drainage noted. The wound margin is distinct with the outline attached to the wound base. There is no granulation within the wound bed. There is no necrotic tissue within the wound bed. The periwound skin appearance had no abnormalities noted for moisture. The periwound skin appearance had no abnormalities noted for color. The periwound skin appearance exhibited: Scarring. Periwound  temperature was noted as No Abnormality. Wound #6 status is Open. Original cause of wound was Pressure Injury. The date acquired was: 09/17/2021. The wound has been in treatment 25 weeks. The wound is located on the Left Trochanter. The wound measures 1.7cm length x 2cm width x 0.1cm depth; 2.67cm^2 area and 0.267cm^3 volume. The wound is limited to skin breakdown. There is no tunneling or undermining noted. There is a medium amount of serosanguineous drainage noted. The wound margin is flat and intact. There is no granulation within the wound bed. There is no necrotic tissue within the wound bed. The periwound skin appearance had no abnormalities noted for moisture. The periwound skin appearance exhibited: Scarring. The periwound skin appearance did not exhibit: Callus, Crepitus, Excoriation, Induration, Rash, Atrophie Blanche, Cyanosis, Ecchymosis, Hemosiderin Staining, Mottled, Pallor, Rubor, Erythema. Periwound temperature was noted as No Abnormality. Assessment Active Problems ICD-10 Pressure ulcer of left buttock, stage 4 Type 2 diabetes mellitus without complications Pressure ulcer of left hip, stage 3 Pressure ulcer of other site, stage 3 Plan Follow-up Appointments: Return Appointment in: - 6 weeks Anesthetic: (In clinic) Topical Lidocaine 5% applied to wound bed - In clinic Bathing/ Shower/ Hygiene: May shower and wash wound with soap and water. - prior to dressing change Off-Loading: Low air-loss mattress (Group 2) Turn and reposition every 2 hours Additional Orders / Instructions: Follow Nutritious Diet - 100-120g of Protein Non Wound Condition: Protect area with: - Bilateral Lateral Knees , and Right Trochanter. Protect with Silicone Foam Borders or ABD pads Home Health: New wound care orders this week; continue Home Health for wound care. May utilize formulary equivalent dressing for wound treatment orders unless otherwise specified. - Silver Alginate to R leg ; Silver  alginate to L trochanter Dakins to gluteus Other Home Health Orders/Instructions: - [facility] The following medication(s) was prescribed: lidocaine topical 4 % cream cream topical was prescribed at facility WOUND #2: - Gluteus Wound Laterality: Left Cleanser: Soap and Water Every Other Day/30 Days Discharge Instructions: May shower and wash wound with dial antibacterial soap and water prior to dressing change. Cleanser: Wound Cleanser Every Other Day/30 Days Discharge Instructions: Cleanse the wound with wound cleanser prior to applying a clean dressing using gauze sponges, not tissue or cotton balls. Prim Dressing: Dakin's Solution 0.25%, 16 (oz) (Generic) Every Other Day/30 Days ary Discharge Instructions: Moisten gauze with Dakin's solution Prim Dressing: Sorbalgon AG Dressing 2x2 (in/in) Every Other Day/30 Days ary Discharge Instructions: Apply to wound bed as instructed Secondary Dressing: Zetuvit Plus Silicone Border Dressing 5x5 (in/in) Every Other Day/30 Days Discharge Instructions: Apply silicone  border over primary dressing as directed. WOUND #4: - Lower Leg Wound Laterality: Right, Lateral, Proximal Cleanser: Soap and Water Every Other Day/30 Days Discharge Instructions: May shower and wash wound with dial antibacterial soap and water prior to dressing change. Cleanser: Wound Cleanser Every Other Day/30 Days Discharge Instructions: Cleanse the wound with wound cleanser prior to applying a clean dressing using gauze sponges, not tissue or cotton balls. [name], [name] ([id number]) [id number] Page 8 of 8 Prim Dressing: KerraCel Ag Gelling Fiber Dressing, 2x2 in (silver alginate) (Generic) Every Other Day/30 Days ary Discharge Instructions: Apply silver alginate to wound bed as instructed Secondary Dressing: Bordered Gauze, 4x4 in (Generic) Every Other Day/30 Days Discharge Instructions: Apply over primary dressing as directed. WOUND #6: - Trochanter Wound  Laterality: Left Cleanser: Soap and Water Every Other Day/30 Days Discharge Instructions: May shower and wash wound with dial antibacterial soap and water prior to dressing change. Cleanser: Wound Cleanser Every Other Day/30 Days Discharge Instructions: Cleanse the wound with wound cleanser prior to applying a clean dressing using gauze sponges, not tissue or cotton balls. Prim Dressing: Sorbalgon AG Dressing 2x2 (in/in) Every Other Day/30 Days ary Discharge Instructions: Apply to wound bed as instructed Secondary Dressing: Bordered Gauze, 4x4 in (Generic) Every Other Day/30 Days Discharge Instructions: Apply over primary dressing as directed. 1. I would like to go back to Dakin's wet-to-dry to the left buttock wound. Surface seems better than what I remember. oo2Silver alginate to the other wounds Electronic Signature(s) Signed: 04/18/2022 5:01:06 PM By: Linton Ham MD Entered By: Linton Ham on 04/18/2022 13:55:34 -------------------------------------------------------------------------------- SuperBill Details Patient Name: Date of Service: [name] 04/18/2022 Medical Record Number: [id number] Patient Account Number: [id number] Date of Birth/Sex: Treating RN: 1936/04/10 (86 y.o. F) Primary Care Provider: [name] Other Clinician: Referring Provider: Treating Provider/Extender: Deloria Lair in Treatment: 80 Diagnosis Coding ICD-10 Codes Code Description [phone] Pressure ulcer of left buttock, stage 4 E11.9 Type 2 diabetes mellitus without complications L97.673 Pressure ulcer of left hip, stage 3 L89.893 Pressure ulcer of other site, stage 3 Facility Procedures : CPT4 Code: [id number] Description: 99214 - WOUND CARE VISIT-LEV 4 EST PT Modifier: Quantity: 1 Physician Procedures : CPT4 Code Description Modifier [id number] - WC PHYS LEVEL 3 - EST PT ICD-10 Diagnosis Description L89.324 Pressure ulcer of left buttock, stage 4  L89.223 Pressure ulcer of left hip, stage 3 L89.893 Pressure ulcer of other site, stage 3 Quantity: 1 Electronic Signature(s) Signed: 04/18/2022 4:59:49 PM By: Adline Peals Signed: 04/18/2022 5:01:06 PM By: Linton Ham MD Entered By: Adline Peals on 04/18/2022 14:19:23

## 2022-04-20 DIAGNOSIS — I890 Lymphedema, not elsewhere classified: Secondary | ICD-10-CM | POA: Diagnosis not present

## 2022-04-20 DIAGNOSIS — L89893 Pressure ulcer of other site, stage 3: Secondary | ICD-10-CM | POA: Diagnosis not present

## 2022-04-20 DIAGNOSIS — Z7984 Long term (current) use of oral hypoglycemic drugs: Secondary | ICD-10-CM | POA: Diagnosis not present

## 2022-04-20 DIAGNOSIS — E1140 Type 2 diabetes mellitus with diabetic neuropathy, unspecified: Secondary | ICD-10-CM | POA: Diagnosis not present

## 2022-04-20 DIAGNOSIS — F0390 Unspecified dementia without behavioral disturbance: Secondary | ICD-10-CM | POA: Diagnosis not present

## 2022-04-20 DIAGNOSIS — L89324 Pressure ulcer of left buttock, stage 4: Secondary | ICD-10-CM | POA: Diagnosis not present

## 2022-04-22 DIAGNOSIS — F0390 Unspecified dementia without behavioral disturbance: Secondary | ICD-10-CM | POA: Diagnosis not present

## 2022-04-22 DIAGNOSIS — E785 Hyperlipidemia, unspecified: Secondary | ICD-10-CM | POA: Diagnosis not present

## 2022-04-22 DIAGNOSIS — Z8673 Personal history of transient ischemic attack (TIA), and cerebral infarction without residual deficits: Secondary | ICD-10-CM | POA: Diagnosis not present

## 2022-04-22 DIAGNOSIS — K5900 Constipation, unspecified: Secondary | ICD-10-CM | POA: Diagnosis not present

## 2022-04-22 DIAGNOSIS — Z86718 Personal history of other venous thrombosis and embolism: Secondary | ICD-10-CM | POA: Diagnosis not present

## 2022-04-22 DIAGNOSIS — Z7984 Long term (current) use of oral hypoglycemic drugs: Secondary | ICD-10-CM | POA: Diagnosis not present

## 2022-04-22 DIAGNOSIS — M6281 Muscle weakness (generalized): Secondary | ICD-10-CM | POA: Diagnosis not present

## 2022-04-22 DIAGNOSIS — I890 Lymphedema, not elsewhere classified: Secondary | ICD-10-CM | POA: Diagnosis not present

## 2022-04-22 DIAGNOSIS — I10 Essential (primary) hypertension: Secondary | ICD-10-CM | POA: Diagnosis not present

## 2022-04-22 DIAGNOSIS — E1140 Type 2 diabetes mellitus with diabetic neuropathy, unspecified: Secondary | ICD-10-CM | POA: Diagnosis not present

## 2022-04-22 DIAGNOSIS — Z741 Need for assistance with personal care: Secondary | ICD-10-CM | POA: Diagnosis not present

## 2022-04-22 DIAGNOSIS — Z86711 Personal history of pulmonary embolism: Secondary | ICD-10-CM | POA: Diagnosis not present

## 2022-04-22 DIAGNOSIS — L89893 Pressure ulcer of other site, stage 3: Secondary | ICD-10-CM | POA: Diagnosis not present

## 2022-04-22 DIAGNOSIS — Z7901 Long term (current) use of anticoagulants: Secondary | ICD-10-CM | POA: Diagnosis not present

## 2022-04-22 DIAGNOSIS — L89324 Pressure ulcer of left buttock, stage 4: Secondary | ICD-10-CM | POA: Diagnosis not present

## 2022-04-25 ENCOUNTER — Encounter (HOSPITAL_BASED_OUTPATIENT_CLINIC_OR_DEPARTMENT_OTHER): Payer: Medicare Other | Admitting: Internal Medicine

## 2022-04-26 DIAGNOSIS — Z7984 Long term (current) use of oral hypoglycemic drugs: Secondary | ICD-10-CM | POA: Diagnosis not present

## 2022-04-26 DIAGNOSIS — E1140 Type 2 diabetes mellitus with diabetic neuropathy, unspecified: Secondary | ICD-10-CM | POA: Diagnosis not present

## 2022-04-26 DIAGNOSIS — F0390 Unspecified dementia without behavioral disturbance: Secondary | ICD-10-CM | POA: Diagnosis not present

## 2022-04-26 DIAGNOSIS — L89324 Pressure ulcer of left buttock, stage 4: Secondary | ICD-10-CM | POA: Diagnosis not present

## 2022-04-26 DIAGNOSIS — L89893 Pressure ulcer of other site, stage 3: Secondary | ICD-10-CM | POA: Diagnosis not present

## 2022-04-26 DIAGNOSIS — I890 Lymphedema, not elsewhere classified: Secondary | ICD-10-CM | POA: Diagnosis not present

## 2022-05-04 DIAGNOSIS — Z7984 Long term (current) use of oral hypoglycemic drugs: Secondary | ICD-10-CM | POA: Diagnosis not present

## 2022-05-04 DIAGNOSIS — F0390 Unspecified dementia without behavioral disturbance: Secondary | ICD-10-CM | POA: Diagnosis not present

## 2022-05-04 DIAGNOSIS — I890 Lymphedema, not elsewhere classified: Secondary | ICD-10-CM | POA: Diagnosis not present

## 2022-05-04 DIAGNOSIS — L89893 Pressure ulcer of other site, stage 3: Secondary | ICD-10-CM | POA: Diagnosis not present

## 2022-05-04 DIAGNOSIS — E1140 Type 2 diabetes mellitus with diabetic neuropathy, unspecified: Secondary | ICD-10-CM | POA: Diagnosis not present

## 2022-05-04 DIAGNOSIS — L89324 Pressure ulcer of left buttock, stage 4: Secondary | ICD-10-CM | POA: Diagnosis not present

## 2022-05-11 DIAGNOSIS — Z7984 Long term (current) use of oral hypoglycemic drugs: Secondary | ICD-10-CM | POA: Diagnosis not present

## 2022-05-11 DIAGNOSIS — F0390 Unspecified dementia without behavioral disturbance: Secondary | ICD-10-CM | POA: Diagnosis not present

## 2022-05-11 DIAGNOSIS — L89324 Pressure ulcer of left buttock, stage 4: Secondary | ICD-10-CM | POA: Diagnosis not present

## 2022-05-11 DIAGNOSIS — I890 Lymphedema, not elsewhere classified: Secondary | ICD-10-CM | POA: Diagnosis not present

## 2022-05-11 DIAGNOSIS — L89893 Pressure ulcer of other site, stage 3: Secondary | ICD-10-CM | POA: Diagnosis not present

## 2022-05-11 DIAGNOSIS — E1140 Type 2 diabetes mellitus with diabetic neuropathy, unspecified: Secondary | ICD-10-CM | POA: Diagnosis not present

## 2022-05-19 DIAGNOSIS — L89893 Pressure ulcer of other site, stage 3: Secondary | ICD-10-CM | POA: Diagnosis not present

## 2022-05-19 DIAGNOSIS — Z7984 Long term (current) use of oral hypoglycemic drugs: Secondary | ICD-10-CM | POA: Diagnosis not present

## 2022-05-19 DIAGNOSIS — I890 Lymphedema, not elsewhere classified: Secondary | ICD-10-CM | POA: Diagnosis not present

## 2022-05-19 DIAGNOSIS — E1140 Type 2 diabetes mellitus with diabetic neuropathy, unspecified: Secondary | ICD-10-CM | POA: Diagnosis not present

## 2022-05-19 DIAGNOSIS — L89324 Pressure ulcer of left buttock, stage 4: Secondary | ICD-10-CM | POA: Diagnosis not present

## 2022-05-19 DIAGNOSIS — F0390 Unspecified dementia without behavioral disturbance: Secondary | ICD-10-CM | POA: Diagnosis not present

## 2022-05-22 DIAGNOSIS — E785 Hyperlipidemia, unspecified: Secondary | ICD-10-CM | POA: Diagnosis not present

## 2022-05-22 DIAGNOSIS — I10 Essential (primary) hypertension: Secondary | ICD-10-CM | POA: Diagnosis not present

## 2022-05-22 DIAGNOSIS — Z7984 Long term (current) use of oral hypoglycemic drugs: Secondary | ICD-10-CM | POA: Diagnosis not present

## 2022-05-22 DIAGNOSIS — Z86711 Personal history of pulmonary embolism: Secondary | ICD-10-CM | POA: Diagnosis not present

## 2022-05-22 DIAGNOSIS — K5900 Constipation, unspecified: Secondary | ICD-10-CM | POA: Diagnosis not present

## 2022-05-22 DIAGNOSIS — Z86718 Personal history of other venous thrombosis and embolism: Secondary | ICD-10-CM | POA: Diagnosis not present

## 2022-05-22 DIAGNOSIS — I890 Lymphedema, not elsewhere classified: Secondary | ICD-10-CM | POA: Diagnosis not present

## 2022-05-22 DIAGNOSIS — Z7901 Long term (current) use of anticoagulants: Secondary | ICD-10-CM | POA: Diagnosis not present

## 2022-05-22 DIAGNOSIS — Z8673 Personal history of transient ischemic attack (TIA), and cerebral infarction without residual deficits: Secondary | ICD-10-CM | POA: Diagnosis not present

## 2022-05-22 DIAGNOSIS — L89892 Pressure ulcer of other site, stage 2: Secondary | ICD-10-CM | POA: Diagnosis not present

## 2022-05-22 DIAGNOSIS — L89324 Pressure ulcer of left buttock, stage 4: Secondary | ICD-10-CM | POA: Diagnosis not present

## 2022-05-22 DIAGNOSIS — E1140 Type 2 diabetes mellitus with diabetic neuropathy, unspecified: Secondary | ICD-10-CM | POA: Diagnosis not present

## 2022-05-22 DIAGNOSIS — F0390 Unspecified dementia without behavioral disturbance: Secondary | ICD-10-CM | POA: Diagnosis not present

## 2022-05-26 DIAGNOSIS — E1140 Type 2 diabetes mellitus with diabetic neuropathy, unspecified: Secondary | ICD-10-CM | POA: Diagnosis not present

## 2022-05-26 DIAGNOSIS — L89324 Pressure ulcer of left buttock, stage 4: Secondary | ICD-10-CM | POA: Diagnosis not present

## 2022-05-26 DIAGNOSIS — Z7984 Long term (current) use of oral hypoglycemic drugs: Secondary | ICD-10-CM | POA: Diagnosis not present

## 2022-05-26 DIAGNOSIS — L89892 Pressure ulcer of other site, stage 2: Secondary | ICD-10-CM | POA: Diagnosis not present

## 2022-05-26 DIAGNOSIS — I890 Lymphedema, not elsewhere classified: Secondary | ICD-10-CM | POA: Diagnosis not present

## 2022-05-26 DIAGNOSIS — F0390 Unspecified dementia without behavioral disturbance: Secondary | ICD-10-CM | POA: Diagnosis not present

## 2022-05-30 ENCOUNTER — Encounter (HOSPITAL_BASED_OUTPATIENT_CLINIC_OR_DEPARTMENT_OTHER): Payer: Medicare Other | Attending: General Surgery | Admitting: General Surgery

## 2022-05-30 DIAGNOSIS — Z7984 Long term (current) use of oral hypoglycemic drugs: Secondary | ICD-10-CM | POA: Insufficient documentation

## 2022-05-30 DIAGNOSIS — E119 Type 2 diabetes mellitus without complications: Secondary | ICD-10-CM | POA: Insufficient documentation

## 2022-05-30 DIAGNOSIS — L89892 Pressure ulcer of other site, stage 2: Secondary | ICD-10-CM | POA: Diagnosis not present

## 2022-05-30 DIAGNOSIS — I10 Essential (primary) hypertension: Secondary | ICD-10-CM | POA: Insufficient documentation

## 2022-05-30 DIAGNOSIS — F0390 Unspecified dementia without behavioral disturbance: Secondary | ICD-10-CM | POA: Diagnosis not present

## 2022-05-30 DIAGNOSIS — L89324 Pressure ulcer of left buttock, stage 4: Secondary | ICD-10-CM | POA: Insufficient documentation

## 2022-05-30 DIAGNOSIS — L89893 Pressure ulcer of other site, stage 3: Secondary | ICD-10-CM | POA: Insufficient documentation

## 2022-05-30 DIAGNOSIS — L89323 Pressure ulcer of left buttock, stage 3: Secondary | ICD-10-CM | POA: Diagnosis not present

## 2022-05-30 NOTE — Progress Notes (Addendum)
[name], Contreras ([id number]) [id number] Page 1 of 8 Visit Report for 05/30/2022 Arrival Information Details Patient Name: Date of Service: [name], [name] 05/30/2022 3:30 PM Medical Record Number: [id number] Patient Account Number: [id number] Date of Birth/Sex: Treating RN: 04-26-1936 (87 y.o. [name] Primary Care Aubrielle Stroud: Abbey Chatters Other Clinician: Referring Annais Crafts: Treating Fredrik Mogel/Extender: Christella Scheuermann in Treatment: 80 Visit Information History Since Last Visit Added or deleted any medications: No Patient Arrived: Wheel Chair Any new allergies or adverse reactions: No Arrival Time: 15:55 Had a fall or experienced change in No Accompanied By: daughter activities of daily living that may affect Transfer Assistance: Manual risk of falls: Patient Identification Verified: Yes Signs or symptoms of abuse/neglect since last visito No Secondary Verification Process Completed: Yes Hospitalized since last visit: No Patient Requires Transmission-Based Precautions: No Implantable device outside of the clinic excluding No Patient Has Alerts: No cellular tissue based products placed in the center since last visit: Has Dressing in Place as Prescribed: Yes Pain Present Now: Unable to Respond Electronic Signature(s) Signed: 05/30/2022 4:31:10 PM By: Samuella Bruin Entered By: Samuella Bruin on 05/30/2022 15:55:52 -------------------------------------------------------------------------------- Encounter Discharge Information Details Patient Name: Date of Service: [name] 05/30/2022 3:30 PM Medical Record Number: [id number] Patient Account Number: [id number] Date of Birth/Sex: Treating RN: 1936/01/02 (87 y.o. [name] Primary Care Callie Facey: Abbey Chatters Other Clinician: Referring Simren Popson: Treating [name]/Extender: Christella Scheuermann in Treatment: 30 Encounter  Discharge Information Items Post Procedure Vitals Discharge Condition: Stable Temperature (F): 97.5 Ambulatory Status: Wheelchair Pulse (bpm): 88 Discharge Destination: Home Respiratory Rate (breaths/min): 18 Transportation: Private Auto Blood Pressure (mmHg): 165/110 Accompanied By: daughter Schedule Follow-up Appointment: Yes Clinical Summary of Care: Patient Declined Electronic Signature(s) Signed: 05/30/2022 5:34:39 PM By: Zenaida Deed RN, BSN Entered By: Zenaida Deed on 05/30/2022 16:58:56 Arley Contreras ([id number]) [id number] Page 2 of 8 -------------------------------------------------------------------------------- Lower Extremity Assessment Details Patient Name: Date of Service: [name], [name] 05/30/2022 3:30 PM Medical Record Number: [id number] Patient Account Number: [id number] Date of Birth/Sex: Treating RN: [birth year] (87 y.o. [name] Primary Care Rohen Kimes: Abbey Chatters Other Clinician: Referring Diavian Furgason: Treating Linville Decarolis/Extender: Christella Scheuermann in Treatment: 72 Electronic Signature(s) Signed: 05/30/2022 4:31:10 PM By: Gelene Mink By: Samuella Bruin on 05/30/2022 16:07:22 -------------------------------------------------------------------------------- Multi Wound Chart Details Patient Name: Date of Service: [name] 05/30/2022 3:30 PM Medical Record Number: [id number] Patient Account Number: [id number] Date of Birth/Sex: Treating RN: 05-21-1935 (87 y.o. F) Primary Care [name]: Abbey Chatters Other Clinician: Referring Jeanie Mccard: Treating [name]/Extender: Christella Scheuermann in Treatment: 4 Vital Signs Height(in): 68 Pulse(bpm): 88 Weight(lbs): 110 Blood Pressure(mmHg): 165/110 Body Mass Index(BMI): 16.7 Temperature(F): 97.5 Respiratory Rate(breaths/min): 16 [2:Photos:] Left Gluteus Right, Proximal, Lateral Lower Leg Left  Trochanter Wound Location: Pressure Injury Trauma Pressure Injury Wounding Event: Pressure Ulcer Pressure Ulcer Pressure Ulcer Primary Etiology: Anemia, Deep Vein Thrombosis, Anemia, Deep Vein Thrombosis, Anemia, Deep Vein Thrombosis, Comorbid History: Hypertension, Type II Diabetes, Hypertension, Type II Diabetes, Hypertension, Type II Diabetes, Dementia Dementia Dementia 09/18/2020 09/08/2021 09/17/2021 Date Acquired: 86 37 31 Weeks of Treatment: Open Open Open Wound Status: No No No Wound Recurrence: 4.4x2.8x1.4 1x0.9x0.1 0x0x0 Measurements L x W x D (cm) 9.676 0.707 0 A (cm) : rea 13.547 0.071 0 Volume (cm) : 76.80% -499.20% 100.00% % Reduction in A rea: -225.20% -491.70% 100.00% % Reduction in Volume: 12 Starting Position 1 (o'clock): 6 Ending Position 1 (o'clock): 3.5 Maximum Distance 1 (cm): Yes No No Undermining: Category/Stage III  Category/Stage II Unstageable/Unclassified Classification: Medium Medium None Present Exudate A mount: Serosanguineous Serosanguineous N/A Exudate TypeAUBRY, FARNES ([id number]) [id number] Page 3 of 8 red, brown red, brown N/A Exudate Color: Epibole Distinct, outline attached N/A Wound Margin: Large (67-100%) Small (1-33%) None Present (0%) Granulation Amount: Red, Pale Red N/A Granulation Quality: Small (1-33%) Large (67-100%) None Present (0%) Necrotic Amount: Fat Layer (Subcutaneous Tissue): Yes Fat Layer (Subcutaneous Tissue): Yes Fascia: No Exposed Structures: Fascia: No Fascia: No Fat Layer (Subcutaneous Tissue): No Tendon: No Tendon: No Tendon: No Muscle: No Muscle: No Muscle: No Joint: No Joint: No Joint: No Bone: No Bone: No Bone: No Limited to Skin Breakdown Small (1-33%) Small (1-33%) Large (67-100%) Epithelialization: Debridement - Selective/Open Wound Debridement - Selective/Open Wound N/A Debridement: Pre-procedure Verification/Time Out 16:25 16:25  N/A Taken: Lidocaine 5% topical ointment Lidocaine 5% topical ointment N/A Pain Control: [facility] N/A Tissue Debrided: Non-Viable Tissue Non-Viable Tissue N/A Level: 12.32 0.9 N/A Debridement A (sq cm): rea Curette Curette N/A Instrument: Minimum Minimum N/A Bleeding: Pressure Pressure N/A Hemostasis A chieved: 0 0 N/A Procedural Pain: 0 0 N/A Post Procedural Pain: Procedure was tolerated well Procedure was tolerated well N/A Debridement Treatment Response: 4.4x2.8x1.4 1x0.9x0.1 N/A Post Debridement Measurements L x W x D (cm) 13.547 0.071 N/A Post Debridement Volume: (cm) Category/Stage III Category/Stage II N/A Post Debridement Stage: Excoriation: Yes Scarring: Yes Scarring: Yes Periwound Skin Texture: Scarring: Yes Excoriation: No Induration: No Callus: No Crepitus: No Rash: No No Abnormalities Noted No Abnormalities Noted Maceration: No Periwound Skin Moisture: Dry/Scaly: No No Abnormalities Noted No Abnormalities Noted Atrophie Blanche: No Periwound Skin Color: Cyanosis: No Ecchymosis: No Erythema: No Hemosiderin Staining: No Mottled: No Pallor: No Rubor: No No Abnormality No Abnormality No Abnormality Temperature: Debridement Debridement N/A Procedures Performed: Treatment Notes Electronic Signature(s) Signed: 05/30/2022 4:51:42 PM By: Duanne Guess MD FACS Entered By: Duanne Guess on 05/30/2022 16:51:42 -------------------------------------------------------------------------------- Multi-Disciplinary Care Plan Details Patient Name: Date of Service: [name] 05/30/2022 3:30 PM Medical Record Number: [id number] Patient Account Number: [id number] Date of Birth/Sex: Treating RN: December 09, 1935 (86 y.o. [name] Primary Care Caldwell Kronenberger: Abbey Chatters Other Clinician: Referring Mio Schellinger: Treating Taelynn Mcelhannon/Extender: Christella Scheuermann in Treatment: 62 Multidisciplinary Care Plan reviewed with  physician Active Inactive Electronic Signature(s) Signed: 11/08/2022 3:03:48 PM By: Samuella Bruin Previous Signature: 05/30/2022 4:31:10 PM Version By: Lance Sell, Luanne Bras ([id number]) [id number] Page 4 of 8 Previous Signature: 05/30/2022 4:31:10 PM Version By: Samuella Bruin Entered By: Samuella Bruin on 06/15/2022 07:34:56 -------------------------------------------------------------------------------- Pain Assessment Details Patient Name: Date of Service: QUINISHA, CERMINARA 05/30/2022 3:30 PM Medical Record Number: [id number] Patient Account Number: [id number] Date of Birth/Sex: Treating RN: 05/23/1935 (87 y.o. [name] Primary Care Marrian Bells: Abbey Chatters Other Clinician: Referring Sayvion Vigen: Treating Bayle Calvo/Extender: Christella Scheuermann in Treatment: 5 Active Problems Location of Pain Severity and Description of Pain Patient Has Paino Patient Unable to Respond Site Locations Pain Management and Medication Current Pain Management: Electronic Signature(s) Signed: 05/30/2022 4:31:10 PM By: Samuella Bruin Entered By: Samuella Bruin on 05/30/2022 16:07:18 -------------------------------------------------------------------------------- Patient/Caregiver Education Details Patient Name: Date of Service: [name] 1/22/2024andnbsp3:30 PM Medical Record Number: [id number] Patient Account Number: [id number] Date of Birth/Gender: Treating RN: [birth year] (87 y.o. [name] Primary Care Physician: Abbey Chatters Other Clinician: Referring Physician: Treating Physician/Extender: Christella Scheuermann in Treatment: 49 Education Assessment Education Provided To: Patient Education Topics Provided RAZIA, CURRENT ([id number]) [id number] Page 5 of 8 Pressure: Methods: Explain/Verbal Responses: Reinforcements needed,  State content  correctly Wound/Skin Impairment: Methods: Explain/Verbal Responses: Reinforcements needed, State content correctly Electronic Signature(s) Signed: 05/30/2022 4:31:10 PM By: Samuella Bruin Entered By: Samuella Bruin on 05/30/2022 16:18:54 -------------------------------------------------------------------------------- Wound Assessment Details Patient Name: Date of Service: [name], [name] 05/30/2022 3:30 PM Medical Record Number: [id number] Patient Account Number: [id number] Date of Birth/Sex: Treating RN: Jun 12, 1935 (87 y.o. [name] Primary Care Lenka Zhao: Abbey Chatters Other Clinician: Referring Erabella Kuipers: Treating Xhaiden Coombs/Extender: Christella Scheuermann in Treatment: 65 Wound Status Wound Number: 2 Primary Pressure Ulcer Etiology: Wound Location: Left Gluteus Wound Status: Open Wounding Event: Pressure Injury Comorbid Anemia, Deep Vein Thrombosis, Hypertension, Type II Date Acquired: 09/18/2020 History: Diabetes, Dementia Weeks Of Treatment: 86 Clustered Wound: No Photos Wound Measurements Length: (cm) 4.4 Width: (cm) 2.8 Depth: (cm) 1.4 Area: (cm) 9.676 Volume: (cm) 13.547 % Reduction in Area: 76.8% % Reduction in Volume: -225.2% Epithelialization: Small (1-33%) Undermining: Yes Starting Position (o'clock): 12 Ending Position (o'clock): 6 Maximum Distance: (cm) 3.5 Wound Description Classification: Category/Stage III Wound Margin: Epibole Exudate Amount: Medium Exudate Type: Serosanguineous Exudate Color: red, brown Foul Odor After Cleansing: No Slough/Fibrino Yes Wound Bed Granulation Amount: Large (67-100%) Exposed Structure Granulation Quality: Red, Pale Fascia Exposed: No Necrotic Amount: Small (1-33%) Fat Layer (Subcutaneous Tissue) ExposedVALLARIE, EMBERTON ([id number]) [id number] Page 6 of 8 Necrotic Quality: Adherent Slough Tendon Exposed: No Muscle Exposed: No Joint Exposed:  No Bone Exposed: No Periwound Skin Texture Texture Color No Abnormalities Noted: No No Abnormalities Noted: Yes Excoriation: Yes Temperature / Pain Scarring: Yes Temperature: No Abnormality Moisture No Abnormalities Noted: Yes Electronic Signature(s) Signed: 05/30/2022 4:31:10 PM By: Samuella Bruin Entered By: Samuella Bruin on 05/30/2022 16:18:09 -------------------------------------------------------------------------------- Wound Assessment Details Patient Name: Date of Service: [name], [name] 05/30/2022 3:30 PM Medical Record Number: [id number] Patient Account Number: [id number] Date of Birth/Sex: Treating RN: [birth year] (87 y.o. [name] Primary Care Pasqualino Witherspoon: Abbey Chatters Other Clinician: Referring Adolf Ormiston: Treating Tracye Szuch/Extender: Christella Scheuermann in Treatment: 10 Wound Status Wound Number: 4 Primary Pressure Ulcer Etiology: Wound Location: Right, Proximal, Lateral Lower Leg Wound Status: Open Wounding Event: Trauma Comorbid Anemia, Deep Vein Thrombosis, Hypertension, Type II Date Acquired: 09/08/2021 History: Diabetes, Dementia Weeks Of Treatment: 37 Clustered Wound: No Photos Wound Measurements Length: (cm) 1 Width: (cm) 0.9 Depth: (cm) 0.1 Area: (cm) 0.707 Volume: (cm) 0.071 % Reduction in Area: -499.2% % Reduction in Volume: -491.7% Epithelialization: Small (1-33%) Tunneling: No Undermining: No Wound Description Classification: Category/Stage II Wound Margin: Distinct, outline attached Exudate Amount: Medium Exudate Type: Serosanguineous Exudate Color: red, brown Foul Odor After Cleansing: No Slough/Fibrino No Wound Bed Granulation Amount: Small (1-33%) Exposed Structure Granulation Quality: Red Fascia Exposed: No LITITIA, POSA ([id number]) [id number] Page 7 of 8 Necrotic Amount: Large (67-100%) Fat Layer (Subcutaneous Tissue) Exposed: Yes Necrotic Quality: Adherent  Slough Tendon Exposed: No Muscle Exposed: No Joint Exposed: No Bone Exposed: No Periwound Skin Texture Texture Color No Abnormalities Noted: No No Abnormalities Noted: Yes Scarring: Yes Temperature / Pain Temperature: No Abnormality Moisture No Abnormalities Noted: Yes Electronic Signature(s) Signed: 05/30/2022 4:31:10 PM By: Samuella Bruin Entered By: Samuella Bruin on 05/30/2022 16:18:49 -------------------------------------------------------------------------------- Wound Assessment Details Patient Name: Date of Service: [name], [name] 05/30/2022 3:30 PM Medical Record Number: [id number] Patient Account Number: [id number] Date of Birth/Sex: Treating RN: [birth year] (87 y.o. [name] Primary Care Maelyn Berrey: Abbey Chatters Other Clinician: Referring Kweli Grassel: Treating Lilo Wallington/Extender: Christella Scheuermann in Treatment: 37 Wound Status Wound Number: 6 Primary Pressure Ulcer Etiology: Wound  Location: Left Trochanter Wound Status: Open Wounding Event: Pressure Injury Comorbid Anemia, Deep Vein Thrombosis, Hypertension, Type II Date Acquired: 09/17/2021 History: Diabetes, Dementia Weeks Of Treatment: 31 Clustered Wound: No Photos Wound Measurements Length: (cm) Width: (cm) Depth: (cm) Area: (cm) Volume: (cm) 0 % Reduction in Area: 100% 0 % Reduction in Volume: 100% 0 Epithelialization: Large (67-100%) 0 Tunneling: No 0 Undermining: No Wound Description Classification: Unstageable/Unclassified Exudate Amount: None Present Foul Odor After Cleansing: No Slough/Fibrino No Wound Bed Granulation Amount: None Present (0%) Exposed Structure Necrotic Amount: None Present (0%) Fascia Exposed: No Fat Layer (Subcutaneous Tissue) Exposed: No Tendon Exposed: No [name], [name] ([id number]) [id number] Page 8 of 8 Tendon Exposed: No Muscle Exposed: No Joint Exposed: No Bone Exposed: No Limited to Skin  Breakdown Periwound Skin Texture Texture Color No Abnormalities Noted: No No Abnormalities Noted: Yes Callus: No Temperature / Pain Crepitus: No Temperature: No Abnormality Excoriation: No Induration: No Rash: No Scarring: Yes Moisture No Abnormalities Noted: Yes Electronic Signature(s) Signed: 05/30/2022 4:31:10 PM By: Samuella Bruin Entered By: Samuella Bruin on 05/30/2022 16:17:44 -------------------------------------------------------------------------------- Vitals Details Patient Name: Date of Service: [name] 05/30/2022 3:30 PM Medical Record Number: [id number] Patient Account Number: [id number] Date of Birth/Sex: Treating RN: 27-Jun-1935 (86 y.o. [name] Primary Care Yennifer Segovia: Abbey Chatters Other Clinician: Referring Zalma Channing: Treating Signora Zucco/Extender: Christella Scheuermann in Treatment: 68 Vital Signs Time Taken: 16:04 Temperature (F): 97.5 Height (in): 68 Pulse (bpm): 88 Weight (lbs): 110 Respiratory Rate (breaths/min): 16 Body Mass Index (BMI): 16.7 Blood Pressure (mmHg): 165/110 Reference Range: 80 - 120 mg / dl Electronic Signature(s) Signed: 05/30/2022 4:31:10 PM By: Samuella Bruin Entered By: Samuella Bruin on 05/30/2022 16:07:13

## 2022-05-30 NOTE — Progress Notes (Signed)
[name], [name] ([id number]) [id number] Page 1 of 13 Visit Report for 05/30/2022 Chief Complaint Document Details Patient Name: Date of Service: [name], [name] 05/30/2022 3:30 PM Medical Record Number: [id number] Patient Account Number: [id number] Date of Birth/Sex: Treating RN: 09/03/1935 (87 y.o. F) Primary Care Provider: [name] Other Clinician: Referring Provider: Treating Provider/Extender: Carmie End in Treatment: 64 Information Obtained from: Patient Chief Complaint LEFT buttocks pressure ulcer Electronic Signature(s) Signed: 05/30/2022 4:58:04 PM By: Fredirick Maudlin MD FACS Entered By: Fredirick Maudlin on 05/30/2022 16:58:04 -------------------------------------------------------------------------------- Debridement Details Patient Name: Date of Service: [name], [name] 05/30/2022 3:30 PM Medical Record Number: [id number] Patient Account Number: [id number] Date of Birth/Sex: Treating RN: April 03, 1936 (87 y.o. [name] Primary Care Provider: [name] Other Clinician: Referring Provider: Treating Provider/Extender: Carmie End in Treatment: 86 Debridement Performed for Assessment: Wound #2 Left Gluteus Performed By: Physician Fredirick Maudlin, MD Debridement Type: Debridement Level of Consciousness (Pre-procedure): Awake and Alert Pre-procedure Verification/Time Out Yes - 16:25 Taken: Start Time: 16:25 Pain Control: Lidocaine 5% topical ointment T Area Debrided (L x W): otal 4.4 (cm) x 2.8 (cm) = 12.32 (cm) Tissue and other material debrided: Non-Viable, Slough, Slough Level: Non-Viable Tissue Debridement Description: Selective/Open Wound Instrument: Curette Bleeding: Minimum Hemostasis Achieved: Pressure Procedural Pain: 0 Post Procedural Pain: 0 Response to Treatment: Procedure was tolerated well Level of Consciousness (Post- Awake and  Alert procedure): Post Debridement Measurements of Total Wound Length: (cm) 4.4 Stage: Category/Stage III Width: (cm) 2.8 Depth: (cm) 1.4 Volume: (cm) 13.547 Character of Wound/Ulcer Post Debridement: Improved Post Procedure Diagnosis Same as [name] ([id number]) [id number] Page 2 of 13 Notes scribed for Dr. Celine Ahr by Baruch Gouty, RN Electronic Signature(s) Signed: 05/30/2022 4:31:10 PM By: Adline Peals Signed: 05/30/2022 5:26:04 PM By: Fredirick Maudlin MD FACS Entered By: Adline Peals on 05/30/2022 16:30:07 -------------------------------------------------------------------------------- Debridement Details Patient Name: Date of Service: [name], [name] 05/30/2022 3:30 PM Medical Record Number: [id number] Patient Account Number: [id number] Date of Birth/Sex: Treating RN: 10-19-1935 (87 y.o. [name] Primary Care Provider: [name] Other Clinician: Referring Provider: Treating Provider/Extender: Carmie End in Treatment: 86 Debridement Performed for Assessment: Wound #4 Right,Proximal,Lateral Lower Leg Performed By: Physician Fredirick Maudlin, MD Debridement Type: Debridement Level of Consciousness (Pre-procedure): Awake and Alert Pre-procedure Verification/Time Out Yes - 16:25 Taken: Start Time: 16:25 Pain Control: Lidocaine 5% topical ointment T Area Debrided (L x W): otal 1 (cm) x 0.9 (cm) = 0.9 (cm) Tissue and other material debrided: Non-Viable, Slough, Slough Level: Non-Viable Tissue Debridement Description: Selective/Open Wound Instrument: Curette Bleeding: Minimum Hemostasis Achieved: Pressure Procedural Pain: 0 Post Procedural Pain: 0 Response to Treatment: Procedure was tolerated well Level of Consciousness (Post- Awake and Alert procedure): Post Debridement Measurements of Total Wound Length: (cm) 1 Stage: Category/Stage II Width: (cm)  0.9 Depth: (cm) 0.1 Volume: (cm) 0.071 Character of Wound/Ulcer Post Debridement: Improved Post Procedure Diagnosis Same as Pre-procedure Notes scribed for Dr. Celine Ahr by Baruch Gouty, RN Electronic Signature(s) Signed: 05/30/2022 4:31:10 PM By: Adline Peals Signed: 05/30/2022 5:26:04 PM By: Fredirick Maudlin MD FACS Entered By: Adline Peals on 05/30/2022 16:30:34 HPI Details -------------------------------------------------------------------------------- [name] ([id number]) [id number] Page 3 of 13 Patient Name: Date of Service: [name], [name] 05/30/2022 3:30 PM Medical Record Number: [id number] Patient Account Number: [id number] Date of Birth/Sex: Treating RN: [birth year] (87 y.o. F) Primary Care Provider: [name] Other Clinician: Referring Provider: Treating Provider/Extender: Carmie End in Treatment: 93 History of Present Illness  HPI Description: Admission 5/27 [name] is an 87 year old female with a past medical history of type 2 diabetes on oral agents, hypertension and dementia that presents today to the clinic for a wound to her right buttocks. Her daughter-in-law is present today and helps provide the history. This issue has been going on for 2 to 3 weeks now. She states she developed an eschar about 1 week ago. She has been taking doxycycline for possible soft tissue infection to the area prescribed by her primary care provider. Patient currently denies any signs of infection. 6/3; patient admitted to the clinic last week. She has a large necrotic area on her left buttock. They use Santyl last week. On arrival this visit she had completely necrotic surface with open to subcutaneous tissue. The surface was completely nonviable. They have been using Santyl. She apparently had been on doxycycline which she is just finishing prescribed by her primary doctor for possible soft tissue  infection 6/7; patient with a large necrotic wound over her left buttock. Aggressive debridement last week. Culture of this grew Proteus unfortunately would not of been covered well or at least predictably by the doxycycline that her primary doctor put her on. I have therefore put her on Augmentin suspension 3 times a day. We are using silver alginate as the primary dressing while we deal with the infection. Her daughter has a list of concerns including swallowing difficulties, concerns about aspiration. The patient has advanced dementia. If this is Alzheimer's disease it is at its preterminal stages. I went over that swallowing difficulties are part of what happens in the preterminal stages of Alzheimer's disease. Nevertheless we will family members seems to want to pursue a very aggressive course 6/21 large wound over her left buttock. She has completed the antibiotics I gave her [Augmentin]. We are using silver alginate while we are dealing with the infection and also to help with the drainage In general the wound looks better she has healthy granulation over perhaps 70% of it. Superiorly there is still necrotic debris I did not attempt to debride this today The patient has advanced dementia. I do not know that she could tolerate a wound VAC. She also has double incontinence which would make that difficult. Nevertheless she has been cared for diligently by her family. She is apparently eating well and may be 2 to 3 hours on this area all day 7/5; 2-week follow-up. She continues with a nice improvement in overall condition of the wound bed. The undermining area has still some adherent surface slough. We have been using silver alginate because of the drainage and possibility of at least superficial wound infection/colonization. Although I said in previous notes I wondered whether she could tolerate the wound VAC they have done so well with this wound at home I do not want to necessarily rule her out  for a wound VAC and I am going to direct the start of a trial of wound VAC therapy if this can be covered by insurance, if we have home health support to change it at all 7/19; we did not get very far with the wound VAC because my original ICD 10 said this was unstageable. As usual this represents a considerable frustration because we would not of ordered a wound VAC if the wound was still unstageable. This currently represents a stage IV staging. This is all the way down through muscle layer. There is a rim of tissue over the bone and no palpable exposed bone but  this is a deep wound. Fortunately at present no evidence of infection. We have been using silver collagen with backing wet-to-dry. The wound is really no better today but no worse 8/2; patient presents for 2-week follow-up. She has been using the wound VAC for the past week. There has been some issues with keeping the drape in place however home health comes out to reapply the drape when this happens. Overall there are no issues or complaints today. No signs of infection. 8/17; patient has been using the wound VAC on her wound on the upper left buttock. About 50 to 60% of this area is fully granulated however from about 10-5 o'clock there is still undermining. UNFORTUNATELY she also arrives in clinic today with a wound over her right fibular head. According to her daughter has been there for about 2 weeks. She wonders whether there is an abrasion although no concrete history of this. Given her frail status this is probably most likely a pressure ulcer over a bony prominence [fibular head] 8/31 unfortunately the upper left buttock pressure ulcer stage IV is not as optimistically better as I was hoping. The granulation that I identified on 12/23/20 is no longer adherent at the inferior pole the undermining is quite extensive but there is no palpable bone. No evidence of surrounding infection. There may be some improvement in the undermining  measurements We are still dealing with the additional wound we identified 2 weeks ago on the right fibular head as well we have been using silver alginate here 9/14; 1 month follow-up. She has now had the wound VAC on for probably 2 months. No major change here if she still has the tunneling area at 12:00 and undermining from about 6-12. Surface of the wound does not look healthy. We use MolecuLight to look at the wound surface which actually looked negative however she has extensive immunofluorescence in the wound edge from about 12-6 o'clock this actually extends beyond the visible wound margin by quite amount. 9/27; no major change in the left buttock wound. There is still undermining however no exposed bone. Granulation looks reasonable. Right fibular head is much smaller We are using Hydrofera Blue on the right fibular head silver alginate on the buttock area 10/12; left buttock wound is large with undermining. I think this is about the same as last time granulation looks reasonable there is no exposed bone. The area on the fibular head is almost completely closed 11/30; left buttock wound large wound with undermining superiorly. As her daughter points out there is some change in the granulation superiorly and more darker red color also some raised areas of hypergranulation. I cannot tell that the area is infected. We are using silver alginate she is changing this daily On the right fibular head the area is just about closed 12/28; the patient's wound over her right fibular head is closed. Her left buttock ulcer looks actually a little better. Measurements are slightly improved. There is no exposed bone. The patient's daughter came in asking me about more aggressive options to attempt to get closure of the subcu wound including surgery. I thought we had mostly agreed that this would be a palliative type setting and that I did not expect this to actually close nevertheless I tried to answer her  questions. I do not think the patient is a candidate for a myocutaneous flap. She is simply too frail and I doubt plastic surgery would consider her a viable candidate. She might be a candidate for an  advanced treatment option like Oasis which sometimes can stimulate granulation to get these wounds to gradually close. I told her in order for this to make sense she would need probably a Foley catheter to control the incontinence and unfortunately weekly visits which would be difficult on this patient 1/25; 1 month follow-up. The patient has a large stage IV left buttock ulcer. Substantially deep but no exposed bone. She has advanced dementia. I do not think she is a candidate for anything aggressive and I follow her on a palliative basis. I been over this with her daughter many times. We have been using silver alginate backing ABDs. She has a level 3 surface on her mattress in spite of this she developed some degree of erythema today on the upper left pelvis [name], [name] ([id number]) [id number] Page 4 of 13 2/22; 1 month follow-up. Large left buttock pressure ulcer. We've been using silver alginate as a palliative dressing. Essentially deep wound with no exposed bone. No evidence of surrounding infection. This patient has advanced/preterminal dementia. Her daughter reports that he had a time where she was not eating very well although she seems to be improved lately. she does not appear to be systemically unwell 07/28/2021: 1 month follow-up. The overall wound dimensions are little bit smaller but she does have significant undermining and epibole. They have been using silver alginate in the wound. There is a small amount of slough buildup. The daughter-in-law that is with her today also expressed concern about some discoloration on the patient's left trochanter and right fibular head. 09/08/2021: 6-week follow-up. No significant change to the left buttock ulcer. There is a  bit of slough buildup on the surface but it is otherwise clean without significant drainage or odor. The left trochanter looks like it may have partially broken down and then subsequently healed without intervention; there is some eschar overlying this and when I removed it, there was fresh pink tissue but no opening. On the right fibular head, this site has opened and there are 2 small wounds with a bit of slough accumulation. No odor or drainage. 10/20/2021: 6-week follow-up. No real change to the left buttock ulcer. The left trochanter is open now. It is fairly superficial but does extend into the fat layer. The right fibular head is clean and limited to the skin. She has a new area of tissue breakdown overlying her sacrum, just medial to her large left buttock ulcer. This looks like it is secondary to shear. 12/01/2021: 6-week follow-up. The left buttock ulcer remains unchanged. There is a light layer of slough and biofilm on the surface. The left trochanter has a heavy layer of eschar on it. The right fibular head wound is deeper today and has heavy slough accumulation. The area of tissue breakdown on her sacrum that was seen at her last visit has healed. 12/24/2021: The patient's daughter-in-law brought her to clinic today, earlier than her usual 6-week interval, because she had a lot more pain and drainage coming from her left trochanter. On inspection, the area is fluctuant and when pressure was applied, frank pus began draining from underneath the fibrinous eschar overlying the wound. The wound on her right fibular head is a little bit smaller but has some nonviable subcutaneous tissue underneath some slough. The sacral wound is unchanged with just a light layer of slough accumulation. 01/12/2022: The culture that I took from the wound on her left trochanter grew out a polymicrobial collection including Proteus mirabilis, strep, Enterococcus faecalis, Escherichia coli and  multiple staph species.  There was macrolide and tetracycline resistance. I prescribed a 10-day course of Augmentin and we have been applying topical gentamicin to the wound. She finished her oral antibiotics. Today, her sacral wound is unchanged. The left trochanter wound is substantially cleaner. There is still some slough and nonviable subcutaneous tissue present. The wound on her right fibular head has some dark fibrinous eschar that is fairly densely adherent. The home health nurse had recommended to the patient's daughter-in-law that she apply Santyl to this location. 01/24/2022: The sacral wound is utterly unchanged. The left trochanter wound is cleaner, smaller, and shallower. The wound on the right fibular head continues to have slough accumulate but the fibrinous eschar has been successfully broken down by Santyl. 02/21/2022: The sacral wound remains unchanged. The left trochanter and right fibular head wounds are substantially smaller with just a bit of slough accumulation. 03/21/2022: The sacral wound has contracted somewhat and has less undermining. There is a layer of slough on the wound surface. The left trochanter wound is down to just a tiny pinhole and the right fibular head has also contracted considerably. There is some slough and eschar on the surface. 12/11; sacral wound is still a large wound although the surface tissue looks better than what I remember. We have been using Dakin's wet-to-dry however the daughter changed her to silver alginate in the last week or 2 because of drainage. She also has an area on the left greater trochanter and left lower leg 05/30/2022: The wound on her trochanter has healed. The fibular head wound has contracted a bit more and has some slough on the surface. The sacral wound also looks smaller than I remember it from when I last saw it in November. Electronic Signature(s) Signed: 05/30/2022 4:59:36 PM By: Fredirick Maudlin MD FACS Entered By: Fredirick Maudlin on 05/30/2022  16:59:36 -------------------------------------------------------------------------------- Physical Exam Details Patient Name: Date of Service: [name], [name] 05/30/2022 3:30 PM Medical Record Number: [id number] Patient Account Number: [id number] Date of Birth/Sex: Treating RN: Nov 23, 1935 (87 y.o. F) Primary Care Provider: [name] Other Clinician: Referring Provider: Treating Provider/Extender: Carmie End in Treatment: 45 Constitutional Hypertensive, asymptomatic. . . . no acute distress. Respiratory Normal work of breathing on room air. Notes 05/30/2022: The wound on her trochanter has healed. The fibular head wound has contracted a bit more and has some slough on the surface. The sacral wound also looks smaller than I remember it from when I last saw it in November. Electronic Signature(s) Signed: 05/30/2022 5:03:22 PM By: Fredirick Maudlin MD FACS Entered By: Fredirick Maudlin on 05/30/2022 17:03:22 [name] ([id number]) [id number] Page 5 of 13 -------------------------------------------------------------------------------- Physician Orders Details Patient Name: Date of Service: SYNETHIA, ENDICOTT 05/30/2022 3:30 PM Medical Record Number: [id number] Patient Account Number: [id number] Date of Birth/Sex: Treating RN: [birth year] (87 y.o. [name] Primary Care Provider: [name] Other Clinician: Referring Provider: Treating Provider/Extender: Carmie End in Treatment: 78 Verbal / Phone Orders: No Diagnosis Coding ICD-10 Coding Code Description L89.324 Pressure ulcer of left buttock, stage 4 E11.9 Type 2 diabetes mellitus without complications B58.309 Pressure ulcer of other site, stage 3 Follow-up Appointments ppointment in: - 6 weeks Return A Anesthetic (In clinic) Topical Lidocaine 5% applied to wound bed - In clinic Bathing/ Shower/ Hygiene May shower and wash  wound with soap and water. - prior to dressing change Off-Loading Low air-loss mattress (Group 2) Turn and reposition every 2 hours Other: - use egg crate foam  with hole cut out to help offload the left glut Additional Orders / Instructions Follow Nutritious Diet - 100-120g of Protein Non Wound Condition Bilateral Lower Extremities Protect area with: - Bilateral Lateral Knees , and Right Trochanter. Protect with Silicone Foam Borders or ABD pads Home Health No change in wound care orders this week; continue Home Health for wound care. May utilize formulary equivalent dressing for wound treatment orders unless otherwise specified. Other Home Health Orders/Instructions: - [facility] Wound Treatment Wound #2 - Gluteus Wound Laterality: Left Cleanser: Soap and Water Every Other Day/30 Days Discharge Instructions: May shower and wash wound with dial antibacterial soap and water prior to dressing change. Cleanser: Wound Cleanser Every Other Day/30 Days Discharge Instructions: Cleanse the wound with wound cleanser prior to applying a clean dressing using gauze sponges, not tissue or cotton balls. Peri-Wound Care: Zinc Oxide Ointment 30g tube Every Other Day/30 Days Discharge Instructions: Apply Zinc Oxide to periwound with each dressing change Prim Dressing: Dakin's Solution 0.25%, 16 (oz) (Generic) Every Other Day/30 Days ary Discharge Instructions: Moisten gauze with Dakin's solution Secondary Dressing: Zetuvit Plus Silicone Border Dressing 5x5 (in/in) Every Other Day/30 Days Discharge Instructions: Apply silicone border over primary dressing as directed. Wound #4 - Lower Leg Wound Laterality: Right, Lateral, Proximal Cleanser: Soap and Water Every Other Day/30 Days Discharge Instructions: May shower and wash wound with dial antibacterial soap and water prior to dressing change. Cleanser: Wound Cleanser Every Other Day/30 Days Discharge Instructions: Cleanse the wound with wound cleanser prior  to applying a clean dressing using gauze sponges, not tissue or cotton balls. [name], [name] ([id number]) [id number] Page 6 of 13 Prim Dressing: KerraCel Ag Gelling Fiber Dressing, 2x2 in (silver alginate) (Generic) Every Other Day/30 Days ary Discharge Instructions: Apply silver alginate to wound bed as instructed Secondary Dressing: Bordered Gauze, 4x4 in (Generic) Every Other Day/30 Days Discharge Instructions: Apply over primary dressing as directed. Electronic Signature(s) Signed: 05/30/2022 5:26:04 PM By: Fredirick Maudlin MD FACS Previous Signature: 05/30/2022 4:40:40 PM Version By: Sabas Sous By: Fredirick Maudlin on 05/30/2022 17:03:41 -------------------------------------------------------------------------------- Problem List Details Patient Name: Date of Service: [name], [name] 05/30/2022 3:30 PM Medical Record Number: [id number] Patient Account Number: [id number] Date of Birth/Sex: Treating RN: [birth year] (87 y.o. [name] Primary Care Provider: [name] Other Clinician: Referring Provider: Treating Provider/Extender: Carmie End in Treatment: 64 Active Problems ICD-10 Encounter Code Description Active Date MDM Diagnosis L89.324 Pressure ulcer of left buttock, stage 4 11/24/2020 No Yes E11.9 Type 2 diabetes mellitus without complications 12/07/8561 No Yes L89.893 Pressure ulcer of other site, stage 3 07/28/2021 No Yes Inactive Problems ICD-10 Code Description Active Date Inactive Date L89.320 Pressure ulcer of left buttock, unstageable 10/13/2020 10/13/2020 L89.93 Pressure ulcer of unspecified site, stage 3 12/23/2020 12/23/2020 Resolved Problems ICD-10 Code Description Active Date Resolved Date L89.223 Pressure ulcer of left hip, stage 3 07/28/2021 07/28/2021 Electronic Signature(s) Signed: 05/30/2022 4:51:24 PM By: Fredirick Maudlin MD FACS Previous Signature: 05/30/2022 4:31:10 PM Version  By: Adline Peals Entered By: Fredirick Maudlin on 05/30/2022 16:51:24 [name] ([id number]) [id number] Page 7 of 13 -------------------------------------------------------------------------------- Progress Note Details Patient Name: Date of Service: [name], [name] 05/30/2022 3:30 PM Medical Record Number: [id number] Patient Account Number: [id number] Date of Birth/Sex: Treating RN: 10-22-1935 (87 y.o. F) Primary Care Provider: [name] Other Clinician: Referring Provider: Treating Provider/Extender: Carmie End in Treatment: 67 Subjective Chief Complaint Information obtained from Patient LEFT buttocks pressure ulcer History of Present Illness (HPI) Admission  5/27 Ms. Ulani Degrasse is an 87 year old female with a past medical history of type 2 diabetes on oral agents, hypertension and dementia that presents today to the clinic for a wound to her right buttocks. Her daughter-in-law is present today and helps provide the history. This issue has been going on for 2 to 3 weeks now. She states she developed an eschar about 1 week ago. She has been taking doxycycline for possible soft tissue infection to the area prescribed by her primary care provider. Patient currently denies any signs of infection. 6/3; patient admitted to the clinic last week. She has a large necrotic area on her left buttock. They use Santyl last week. On arrival this visit she had completely necrotic surface with open to subcutaneous tissue. The surface was completely nonviable. They have been using Santyl. She apparently had been on doxycycline which she is just finishing prescribed by her primary doctor for possible soft tissue infection 6/7; patient with a large necrotic wound over her left buttock. Aggressive debridement last week. Culture of this grew Proteus unfortunately would not of been covered well or at least predictably by the doxycycline  that her primary doctor put her on. I have therefore put her on Augmentin suspension 3 times a day. We are using silver alginate as the primary dressing while we deal with the infection. Her daughter has a list of concerns including swallowing difficulties, concerns about aspiration. The patient has advanced dementia. If this is Alzheimer's disease it is at its preterminal stages. I went over that swallowing difficulties are part of what happens in the preterminal stages of Alzheimer's disease. Nevertheless we will family members seems to want to pursue a very aggressive course 6/21 large wound over her left buttock. She has completed the antibiotics I gave her [Augmentin]. We are using silver alginate while we are dealing with the infection and also to help with the drainage In general the wound looks better she has healthy granulation over perhaps 70% of it. Superiorly there is still necrotic debris I did not attempt to debride this today The patient has advanced dementia. I do not know that she could tolerate a wound VAC. She also has double incontinence which would make that difficult. Nevertheless she has been cared for diligently by her family. She is apparently eating well and may be 2 to 3 hours on this area all day 7/5; 2-week follow-up. She continues with a nice improvement in overall condition of the wound bed. The undermining area has still some adherent surface slough. We have been using silver alginate because of the drainage and possibility of at least superficial wound infection/colonization. Although I said in previous notes I wondered whether she could tolerate the wound VAC they have done so well with this wound at home I do not want to necessarily rule her out for a wound VAC and I am going to direct the start of a trial of wound VAC therapy if this can be covered by insurance, if we have home health support to change it at all 7/19; we did not get very far with the wound VAC  because my original ICD 10 said this was unstageable. As usual this represents a considerable frustration because we would not of ordered a wound VAC if the wound was still unstageable. This currently represents a stage IV staging. This is all the way down through muscle layer. There is a rim of tissue over the bone and no palpable exposed bone but this is a  deep wound. Fortunately at present no evidence of infection. We have been using silver collagen with backing wet-to-dry. The wound is really no better today but no worse 8/2; patient presents for 2-week follow-up. She has been using the wound VAC for the past week. There has been some issues with keeping the drape in place however home health comes out to reapply the drape when this happens. Overall there are no issues or complaints today. No signs of infection. 8/17; patient has been using the wound VAC on her wound on the upper left buttock. About 50 to 60% of this area is fully granulated however from about 10-5 o'clock there is still undermining. UNFORTUNATELY she also arrives in clinic today with a wound over her right fibular head. According to her daughter has been there for about 2 weeks. She wonders whether there is an abrasion although no concrete history of this. Given her frail status this is probably most likely a pressure ulcer over a bony prominence [fibular head] 8/31 unfortunately the upper left buttock pressure ulcer stage IV is not as optimistically better as I was hoping. The granulation that I identified on 12/23/20 is no longer adherent at the inferior pole the undermining is quite extensive but there is no palpable bone. No evidence of surrounding infection. There may be some improvement in the undermining measurements We are still dealing with the additional wound we identified 2 weeks ago on the right fibular head as well we have been using silver alginate here 9/14; 1 month follow-up. She has now had the wound VAC on for  probably 2 months. No major change here if she still has the tunneling area at 12:00 and undermining from about 6-12. Surface of the wound does not look healthy. We use MolecuLight to look at the wound surface which actually looked negative however she has extensive immunofluorescence in the wound edge from about 12-6 o'clock this actually extends beyond the visible wound margin by quite amount. 9/27; no major change in the left buttock wound. There is still undermining however no exposed bone. Granulation looks reasonable. oo Right fibular head is much smaller We are using Hydrofera Blue on the right fibular head silver alginate on the buttock area 10/12; left buttock wound is large with undermining. I think this is about the same as last time granulation looks reasonable there is no exposed bone. The area on the fibular head is almost completely closed 11/30; left buttock wound large wound with undermining superiorly. As her daughter points out there is some change in the granulation superiorly and more darker red color also some raised areas of hypergranulation. I cannot tell that the area is infected. We are using silver alginate she is changing this daily On the right fibular head the area is just about closed [place], [name] ([id number]) [id number] Page 8 of 13 12/28; the patient's wound over her right fibular head is closed. Her left buttock ulcer looks actually a little better. Measurements are slightly improved. There is no exposed bone. The patient's daughter came in asking me about more aggressive options to attempt to get closure of the subcu wound including surgery. I thought we had mostly agreed that this would be a palliative type setting and that I did not expect this to actually close nevertheless I tried to answer her questions. I do not think the patient is a candidate for a myocutaneous flap. She is simply too frail and I doubt plastic surgery would  consider her a viable candidate. She  might be a candidate for an advanced treatment option like Oasis which sometimes can stimulate granulation to get these wounds to gradually close. I told her in order for this to make sense she would need probably a Foley catheter to control the incontinence and unfortunately weekly visits which would be difficult on this patient 1/25; 1 month follow-up. The patient has a large stage IV left buttock ulcer. Substantially deep but no exposed bone. She has advanced dementia. I do not think she is a candidate for anything aggressive and I follow her on a palliative basis. I been over this with her daughter many times. We have been using silver alginate backing ABDs. She has a level 3 surface on her mattress in spite of this she developed some degree of erythema today on the upper left pelvis 2/22; 1 month follow-up. Large left buttock pressure ulcer. We've been using silver alginate as a palliative dressing. Essentially deep wound with no exposed bone. No evidence of surrounding infection. This patient has advanced/preterminal dementia. Her daughter reports that he had a time where she was not eating very well although she seems to be improved lately. she does not appear to be systemically unwell 07/28/2021: 1 month follow-up. The overall wound dimensions are little bit smaller but she does have significant undermining and epibole. They have been using silver alginate in the wound. There is a small amount of slough buildup. The daughter-in-law that is with her today also expressed concern about some discoloration on the patient's left trochanter and right fibular head. 09/08/2021: 6-week follow-up. No significant change to the left buttock ulcer. There is a bit of slough buildup on the surface but it is otherwise clean without significant drainage or odor. The left trochanter looks like it may have partially broken down and then subsequently healed without intervention;  there is some eschar overlying this and when I removed it, there was fresh pink tissue but no opening. On the right fibular head, this site has opened and there are 2 small wounds with a bit of slough accumulation. No odor or drainage. 10/20/2021: 6-week follow-up. No real change to the left buttock ulcer. The left trochanter is open now. It is fairly superficial but does extend into the fat layer. The right fibular head is clean and limited to the skin. She has a new area of tissue breakdown overlying her sacrum, just medial to her large left buttock ulcer. This looks like it is secondary to shear. 12/01/2021: 6-week follow-up. The left buttock ulcer remains unchanged. There is a light layer of slough and biofilm on the surface. The left trochanter has a heavy layer of eschar on it. The right fibular head wound is deeper today and has heavy slough accumulation. The area of tissue breakdown on her sacrum that was seen at her last visit has healed. 12/24/2021: The patient's daughter-in-law brought her to clinic today, earlier than her usual 6-week interval, because she had a lot more pain and drainage coming from her left trochanter. On inspection, the area is fluctuant and when pressure was applied, frank pus began draining from underneath the fibrinous eschar overlying the wound. The wound on her right fibular head is a little bit smaller but has some nonviable subcutaneous tissue underneath some slough. The sacral wound is unchanged with just a light layer of slough accumulation. 01/12/2022: The culture that I took from the wound on her left trochanter grew out a polymicrobial collection including Proteus mirabilis, strep, Enterococcus faecalis, Escherichia coli and multiple staph  species. There was macrolide and tetracycline resistance. I prescribed a 10-day course of Augmentin and we have been applying topical gentamicin to the wound. She finished her oral antibiotics. Today, her sacral wound is  unchanged. The left trochanter wound is substantially cleaner. There is still some slough and nonviable subcutaneous tissue present. The wound on her right fibular head has some dark fibrinous eschar that is fairly densely adherent. The home health nurse had recommended to the patient's daughter-in-law that she apply Santyl to this location. 01/24/2022: The sacral wound is utterly unchanged. The left trochanter wound is cleaner, smaller, and shallower. The wound on the right fibular head continues to have slough accumulate but the fibrinous eschar has been successfully broken down by Santyl. 02/21/2022: The sacral wound remains unchanged. The left trochanter and right fibular head wounds are substantially smaller with just a bit of slough accumulation. 03/21/2022: The sacral wound has contracted somewhat and has less undermining. There is a layer of slough on the wound surface. The left trochanter wound is down to just a tiny pinhole and the right fibular head has also contracted considerably. There is some slough and eschar on the surface. 12/11; sacral wound is still a large wound although the surface tissue looks better than what I remember. We have been using Dakin's wet-to-dry however the daughter changed her to silver alginate in the last week or 2 because of drainage. She also has an area on the left greater trochanter and left lower leg 05/30/2022: The wound on her trochanter has healed. The fibular head wound has contracted a bit more and has some slough on the surface. The sacral wound also looks smaller than I remember it from when I last saw it in November. Patient History Unable to Obtain Patient History due to Dementia. Information obtained from Patient. Family History Stroke - Siblings, No family history of Cancer, Diabetes, Heart Disease, Hereditary Spherocytosis, Hypertension, Kidney Disease, Lung Disease, Seizures, Thyroid Problems, Tuberculosis. Social History Never smoker,  Marital Status - Divorced, Alcohol Use - Never, Drug Use - No History, Caffeine Use - Never. Medical History Eyes Denies history of Cataracts, Glaucoma, Optic Neuritis Ear/Nose/Mouth/Throat Denies history of Chronic sinus problems/congestion, Middle ear problems Hematologic/Lymphatic Patient has history of Anemia Denies history of Hemophilia, Human Immunodeficiency Virus, Lymphedema, Sickle Cell Disease Respiratory Denies history of Aspiration, Asthma, Chronic Obstructive Pulmonary Disease (COPD), Pneumothorax, Sleep Apnea, Tuberculosis Cardiovascular Patient has history of Deep Vein Thrombosis, Hypertension Denies history of Angina, Arrhythmia, Congestive Heart Failure, Coronary Artery Disease, Hypotension, Myocardial Infarction, Peripheral Arterial Disease, Peripheral Venous Disease, Phlebitis, Vasculitis Gastrointestinal Denies history of Cirrhosis , Colitis, Crohnoos, Hepatitis A, Hepatitis B, Hepatitis KENNEDEY DIGILIO, [name] ([id number]) [id number] Page 9 of 13 Endocrine Patient has history of Type II Diabetes Denies history of Type I Diabetes Genitourinary Denies history of End Stage Renal Disease Immunological Denies history of Lupus Erythematosus, Raynaudoos, Scleroderma Integumentary (Skin) Denies history of History of Burn Musculoskeletal Denies history of Gout, Rheumatoid Arthritis, Osteoarthritis, Osteomyelitis Neurologic Patient has history of Dementia Denies history of Neuropathy, Quadriplegia, Paraplegia, Seizure Disorder Oncologic Denies history of Received Chemotherapy, Received Radiation Psychiatric Denies history of Anorexia/bulimia, Confinement Anxiety Hospitalization/Surgery History - cholecystectomy. - vaginal hysterectomy. - vascular surgery. Medical A Surgical History Notes nd Respiratory hx PE Cardiovascular atrial septal aneurysm, hyperlipidemia Genitourinary recent  UTi Neurologic CVA Objective Constitutional Hypertensive, asymptomatic. no acute distress. Vitals Time Taken: 4:04 PM, Height: 68 in, Weight: 110 lbs, BMI: 16.7, Temperature: 97.5 F, Pulse: 88 bpm, Respiratory Rate: 16 breaths/min, Blood Pressure:  165/110 mmHg. Respiratory Normal work of breathing on room air. General Notes: 05/30/2022: The wound on her trochanter has healed. The fibular head wound has contracted a bit more and has some slough on the surface. The sacral wound also looks smaller than I remember it from when I last saw it in November. Integumentary (Hair, Skin) Wound #2 status is Open. Original cause of wound was Pressure Injury. The date acquired was: 09/18/2020. The wound has been in treatment 86 weeks. The wound is located on the Left Gluteus. The wound measures 4.4cm length x 2.8cm width x 1.4cm depth; 9.676cm^2 area and 13.547cm^3 volume. There is Fat Layer (Subcutaneous Tissue) exposed. There is undermining starting at 12:00 and ending at 6:00 with a maximum distance of 3.5cm. There is a medium amount of serosanguineous drainage noted. The wound margin is epibole. There is large (67-100%) red, pale granulation within the wound bed. There is a small (1-33%) amount of necrotic tissue within the wound bed including Adherent Slough. The periwound skin appearance had no abnormalities noted for moisture. The periwound skin appearance had no abnormalities noted for color. The periwound skin appearance exhibited: Excoriation, Scarring. Periwound temperature was noted as No Abnormality. Wound #4 status is Open. Original cause of wound was Trauma. The date acquired was: 09/08/2021. The wound has been in treatment 37 weeks. The wound is located on the Right,Proximal,Lateral Lower Leg. The wound measures 1cm length x 0.9cm width x 0.1cm depth; 0.707cm^2 area and 0.071cm^3 volume. There is Fat Layer (Subcutaneous Tissue) exposed. There is no tunneling or undermining noted. There is a  medium amount of serosanguineous drainage noted. The wound margin is distinct with the outline attached to the wound base. There is small (1-33%) red granulation within the wound bed. There is a large (67-100%) amount of necrotic tissue within the wound bed including Adherent Slough. The periwound skin appearance had no abnormalities noted for moisture. The periwound skin appearance had no abnormalities noted for color. The periwound skin appearance exhibited: Scarring. Periwound temperature was noted as No Abnormality. Wound #6 status is Open. Original cause of wound was Pressure Injury. The date acquired was: 09/17/2021. The wound has been in treatment 31 weeks. The wound is located on the Left Trochanter. The wound measures 0cm length x 0cm width x 0cm depth; 0cm^2 area and 0cm^3 volume. The wound is limited to skin breakdown. There is no tunneling or undermining noted. There is a none present amount of drainage noted. There is no granulation within the wound bed. There is no necrotic tissue within the wound bed. The periwound skin appearance had no abnormalities noted for moisture. The periwound skin appearance had no abnormalities noted for color. The periwound skin appearance exhibited: Scarring. The periwound skin appearance did not exhibit: Callus, Crepitus, Excoriation, Induration, Rash. Periwound temperature was noted as No Abnormality. Assessment Active Problems ICD-10 Pressure ulcer of left buttock, stage 4 [place], [name] ([id number]) [id number] Page 10 of 13 Type 2 diabetes mellitus without complications Pressure ulcer of other site, stage 3 Procedures Wound #2 Pre-procedure diagnosis of Wound #2 is a Pressure Ulcer located on the Left Gluteus . There was a Selective/Open Wound Non-Viable Tissue Debridement with a total area of 12.32 sq cm performed by Fredirick Maudlin, MD. With the following instrument(s): Curette to remove Non-Viable tissue/material.  Material removed includes [facility] after achieving pain control using Lidocaine 5% topical ointment. No specimens were taken. A time out was conducted at 16:25, prior to the start of the procedure. A Minimum amount of bleeding  was controlled with Pressure. The procedure was tolerated well with a pain level of 0 throughout and a pain level of 0 following the procedure. Post Debridement Measurements: 4.4cm length x 2.8cm width x 1.4cm depth; 13.547cm^3 volume. Post debridement Stage noted as Category/Stage III. Character of Wound/Ulcer Post Debridement is improved. Post procedure Diagnosis Wound #2: Same as Pre-Procedure General Notes: scribed for Dr. Celine Ahr by Baruch Gouty, RN. Wound #4 Pre-procedure diagnosis of Wound #4 is a Pressure Ulcer located on the Right,Proximal,Lateral Lower Leg . There was a Selective/Open Wound Non-Viable Tissue Debridement with a total area of 0.9 sq cm performed by Fredirick Maudlin, MD. With the following instrument(s): Curette to remove Non-Viable tissue/material. Material removed includes [facility] after achieving pain control using Lidocaine 5% topical ointment. No specimens were taken. A time out was conducted at 16:25, prior to the start of the procedure. A Minimum amount of bleeding was controlled with Pressure. The procedure was tolerated well with a pain level of 0 throughout and a pain level of 0 following the procedure. Post Debridement Measurements: 1cm length x 0.9cm width x 0.1cm depth; 0.071cm^3 volume. Post debridement Stage noted as Category/Stage II. Character of Wound/Ulcer Post Debridement is improved. Post procedure Diagnosis Wound #4: Same as Pre-Procedure General Notes: scribed for Dr. Celine Ahr by Baruch Gouty, RN. Plan Follow-up Appointments: Return Appointment in: - 6 weeks Anesthetic: (In clinic) Topical Lidocaine 5% applied to wound bed - In clinic Bathing/ Shower/ Hygiene: May shower and wash wound with soap and water. - prior to dressing  change Off-Loading: Low air-loss mattress (Group 2) Turn and reposition every 2 hours Other: - use egg crate foam with hole cut out to help offload the left glut Additional Orders / Instructions: Follow Nutritious Diet - 100-120g of Protein Non Wound Condition: Protect area with: - Bilateral Lateral Knees , and Right Trochanter. Protect with Silicone Foam Borders or ABD pads Home Health: No change in wound care orders this week; continue Home Health for wound care. May utilize formulary equivalent dressing for wound treatment orders unless otherwise specified. Other Home Health Orders/Instructions: - [facility] WOUND #2: - Gluteus Wound Laterality: Left Cleanser: Soap and Water Every Other Day/30 Days Discharge Instructions: May shower and wash wound with dial antibacterial soap and water prior to dressing change. Cleanser: Wound Cleanser Every Other Day/30 Days Discharge Instructions: Cleanse the wound with wound cleanser prior to applying a clean dressing using gauze sponges, not tissue or cotton balls. Peri-Wound Care: Zinc Oxide Ointment 30g tube Every Other Day/30 Days Discharge Instructions: Apply Zinc Oxide to periwound with each dressing change Prim Dressing: Dakin's Solution 0.25%, 16 (oz) (Generic) Every Other Day/30 Days ary Discharge Instructions: Moisten gauze with Dakin's solution Secondary Dressing: Zetuvit Plus Silicone Border Dressing 5x5 (in/in) Every Other Day/30 Days Discharge Instructions: Apply silicone border over primary dressing as directed. WOUND #4: - Lower Leg Wound Laterality: Right, Lateral, Proximal Cleanser: Soap and Water Every Other Day/30 Days Discharge Instructions: May shower and wash wound with dial antibacterial soap and water prior to dressing change. Cleanser: Wound Cleanser Every Other Day/30 Days Discharge Instructions: Cleanse the wound with wound cleanser prior to applying a clean dressing using gauze sponges, not tissue or cotton balls. Prim  Dressing: KerraCel Ag Gelling Fiber Dressing, 2x2 in (silver alginate) (Generic) Every Other Day/30 Days ary Discharge Instructions: Apply silver alginate to wound bed as instructed Secondary Dressing: Bordered Gauze, 4x4 in (Generic) Every Other Day/30 Days Discharge Instructions: Apply over primary dressing as directed. 05/30/2022: The wound on  her trochanter has healed. The fibular head wound has contracted a bit more and has some slough on the surface. The sacral wound also looks smaller than I remember it from when I last saw it in November. I used a curette to debride slough from both the sacral and the right fibular head wound. We will continue silver alginate on the fibular head wound and Dakin's moistened gauze packing the sacral wound. We also discussed additional strategies for offloading, considering that the patient has almost no body fat. She is on a level 2 surface. Follow-up in 6 weeks. LENAE, WHERLEY ([id number]) [id number] Page 11 of 13 Electronic Signature(s) Signed: 05/30/2022 5:05:11 PM By: Fredirick Maudlin MD FACS Entered By: Fredirick Maudlin on 05/30/2022 17:05:11 -------------------------------------------------------------------------------- HxROS Details Patient Name: Date of Service: SIONA, COULSTON 05/30/2022 3:30 PM Medical Record Number: [id number] Patient Account Number: [id number] Date of Birth/Sex: Treating RN: 04-Mar-1936 (87 y.o. F) Primary Care Provider: [name] Other Clinician: Referring Provider: Treating Provider/Extender: Carmie End in Treatment: 41 Unable to Obtain Patient History due to Dementia Information Obtained From Patient Eyes Medical History: Negative for: Cataracts; Glaucoma; Optic Neuritis Ear/Nose/Mouth/Throat Medical History: Negative for: Chronic sinus problems/congestion; Middle ear problems Hematologic/Lymphatic Medical History: Positive for: Anemia Negative for:  Hemophilia; Human Immunodeficiency Virus; Lymphedema; Sickle Cell Disease Respiratory Medical History: Negative for: Aspiration; Asthma; Chronic Obstructive Pulmonary Disease (COPD); Pneumothorax; Sleep Apnea; Tuberculosis Past Medical History Notes: hx PE Cardiovascular Medical History: Positive for: Deep Vein Thrombosis; Hypertension Negative for: Angina; Arrhythmia; Congestive Heart Failure; Coronary Artery Disease; Hypotension; Myocardial Infarction; Peripheral Arterial Disease; Peripheral Venous Disease; Phlebitis; Vasculitis Past Medical History Notes: atrial septal aneurysm, hyperlipidemia Gastrointestinal Medical History: Negative for: Cirrhosis ; Colitis; Crohns; Hepatitis A; Hepatitis B; Hepatitis C Endocrine Medical History: Positive for: Type II Diabetes Negative for: Type I Diabetes Time with diabetes: 15 years Treated with: Oral agents Blood sugar tested every day: Yes Tested : 2 times per day Genitourinary Medical History: Negative for: End Stage Renal Disease Past Medical History NotesANDRIANA, CASA ([id number]) [id number] Page 12 of 13 recent [facility] Immunological Medical History: Negative for: Lupus Erythematosus; Raynauds; Scleroderma Integumentary (Skin) Medical History: Negative for: History of Burn Musculoskeletal Medical History: Negative for: Gout; Rheumatoid Arthritis; Osteoarthritis; Osteomyelitis Neurologic Medical History: Positive for: Dementia Negative for: Neuropathy; Quadriplegia; Paraplegia; Seizure Disorder Past Medical History Notes: CVA Oncologic Medical History: Negative for: Received Chemotherapy; Received Radiation Psychiatric Medical History: Negative for: Anorexia/bulimia; Confinement Anxiety Immunizations Pneumococcal Vaccine: Received Pneumococcal Vaccination: No Implantable Devices None Hospitalization / Surgery History Type of Hospitalization/Surgery cholecystectomy vaginal  hysterectomy vascular surgery Family and Social History Cancer: No; Diabetes: No; Heart Disease: No; Hereditary Spherocytosis: No; Hypertension: No; Kidney Disease: No; Lung Disease: No; Seizures: No; Stroke: Yes - Siblings; Thyroid Problems: No; Tuberculosis: No; Never smoker; Marital Status - Divorced; Alcohol Use: Never; Drug Use: No History; Caffeine Use: Never; Financial Concerns: No; Food, Clothing or Shelter Needs: No; Support System Lacking: No; Transportation Concerns: No Electronic Signature(s) Signed: 05/30/2022 5:26:04 PM By: Fredirick Maudlin MD FACS Entered By: Fredirick Maudlin on 05/30/2022 17:02:49 -------------------------------------------------------------------------------- SuperBill Details Patient Name: Date of Service: [name] 05/30/2022 Medical Record Number: [id number] Patient Account Number: [id number] Date of Birth/Sex: Treating RN: [birth year] (87 y.o. F) Primary Care Provider: [name] Other Clinician: Referring Provider: Treating Provider/Extender: Carmie End in Treatment: [address], [place] ([id number]) [id number] Page 13 of 13 Diagnosis Coding ICD-10 Codes Code Description L89.324 Pressure ulcer of left buttock, stage 4 E11.9 Type 2 diabetes  mellitus without complications Z36.644 Pressure ulcer of other site, stage 3 Facility Procedures : CPT4 Code: [id number] Description: 7597 - DEBRIDE WOUND 1ST 20 SQ CM OR < ICD-10 Diagnosis Description L89.324 Pressure ulcer of left buttock, stage 4 L89.893 Pressure ulcer of other site, stage 3 Modifier: Quantity: 1 Physician Procedures : CPT4 Code Description Modifier [id number] - WC PHYS LEVEL 4 - EST PT 25 ICD-10 Diagnosis Description L89.324 Pressure ulcer of left buttock, stage 4 L89.893 Pressure ulcer of other site, stage 3 E11.9 Type 2 diabetes mellitus without complications Quantity: 1 : [id number] - WC PHYS DEBR WO ANESTH 20 SQ CM  ICD-10 Diagnosis Description L89.324 Pressure ulcer of left buttock, stage 4 L89.893 Pressure ulcer of other site, stage 3 Quantity: 1 Electronic Signature(s) Signed: 05/30/2022 5:05:31 PM By: Fredirick Maudlin MD FACS Entered By: Fredirick Maudlin on 05/30/2022 17:05:30

## 2022-06-01 DIAGNOSIS — E1140 Type 2 diabetes mellitus with diabetic neuropathy, unspecified: Secondary | ICD-10-CM | POA: Diagnosis not present

## 2022-06-01 DIAGNOSIS — L89324 Pressure ulcer of left buttock, stage 4: Secondary | ICD-10-CM | POA: Diagnosis not present

## 2022-06-01 DIAGNOSIS — F0390 Unspecified dementia without behavioral disturbance: Secondary | ICD-10-CM | POA: Diagnosis not present

## 2022-06-01 DIAGNOSIS — Z7984 Long term (current) use of oral hypoglycemic drugs: Secondary | ICD-10-CM | POA: Diagnosis not present

## 2022-06-01 DIAGNOSIS — I890 Lymphedema, not elsewhere classified: Secondary | ICD-10-CM | POA: Diagnosis not present

## 2022-06-01 DIAGNOSIS — L89892 Pressure ulcer of other site, stage 2: Secondary | ICD-10-CM | POA: Diagnosis not present

## 2022-06-08 ENCOUNTER — Encounter: Payer: Self-pay | Admitting: Family

## 2022-06-08 DIAGNOSIS — L89892 Pressure ulcer of other site, stage 2: Secondary | ICD-10-CM | POA: Diagnosis not present

## 2022-06-08 DIAGNOSIS — I890 Lymphedema, not elsewhere classified: Secondary | ICD-10-CM | POA: Diagnosis not present

## 2022-06-08 DIAGNOSIS — L89324 Pressure ulcer of left buttock, stage 4: Secondary | ICD-10-CM | POA: Diagnosis not present

## 2022-06-08 DIAGNOSIS — F0390 Unspecified dementia without behavioral disturbance: Secondary | ICD-10-CM | POA: Diagnosis not present

## 2022-06-08 DIAGNOSIS — E1140 Type 2 diabetes mellitus with diabetic neuropathy, unspecified: Secondary | ICD-10-CM | POA: Diagnosis not present

## 2022-06-08 DIAGNOSIS — Z7984 Long term (current) use of oral hypoglycemic drugs: Secondary | ICD-10-CM | POA: Diagnosis not present

## 2022-06-09 ENCOUNTER — Telehealth: Payer: Medicare Other | Admitting: Nurse Practitioner

## 2022-06-09 DIAGNOSIS — R55 Syncope and collapse: Secondary | ICD-10-CM | POA: Diagnosis not present

## 2022-06-09 DIAGNOSIS — I469 Cardiac arrest, cause unspecified: Secondary | ICD-10-CM | POA: Diagnosis not present

## 2022-06-25 ENCOUNTER — Encounter: Payer: Self-pay | Admitting: Family

## 2022-06-29 ENCOUNTER — Inpatient Hospital Stay: Admitting: Family

## 2022-06-29 ENCOUNTER — Inpatient Hospital Stay

## 2022-07-08 DEATH — deceased

## 2022-07-12 ENCOUNTER — Ambulatory Visit (HOSPITAL_BASED_OUTPATIENT_CLINIC_OR_DEPARTMENT_OTHER): Payer: Medicare Other | Admitting: General Surgery

## 2022-07-15 ENCOUNTER — Ambulatory Visit: Payer: Medicare Other | Admitting: Nurse Practitioner
